# Patient Record
Sex: Male | Born: 1950 | Race: White | Hispanic: No | Marital: Married | Smoking: Never smoker
Health system: Southern US, Community
[De-identification: ages and names within clinical notes are randomized; demographics above are authoritative.]

## PROBLEM LIST (undated history)

## (undated) DIAGNOSIS — J449 Chronic obstructive pulmonary disease, unspecified: Secondary | ICD-10-CM

## (undated) DIAGNOSIS — R112 Nausea with vomiting, unspecified: Secondary | ICD-10-CM

## (undated) DIAGNOSIS — E669 Obesity, unspecified: Secondary | ICD-10-CM

## (undated) DIAGNOSIS — I509 Heart failure, unspecified: Secondary | ICD-10-CM

## (undated) DIAGNOSIS — Z86711 Personal history of pulmonary embolism: Secondary | ICD-10-CM

## (undated) DIAGNOSIS — Z9889 Other specified postprocedural states: Secondary | ICD-10-CM

## (undated) DIAGNOSIS — R55 Syncope and collapse: Secondary | ICD-10-CM

## (undated) DIAGNOSIS — M4310 Spondylolisthesis, site unspecified: Secondary | ICD-10-CM

## (undated) DIAGNOSIS — E785 Hyperlipidemia, unspecified: Secondary | ICD-10-CM

## (undated) DIAGNOSIS — G4730 Sleep apnea, unspecified: Secondary | ICD-10-CM

## (undated) DIAGNOSIS — Z87898 Personal history of other specified conditions: Secondary | ICD-10-CM

## (undated) DIAGNOSIS — F419 Anxiety disorder, unspecified: Secondary | ICD-10-CM

## (undated) DIAGNOSIS — R011 Cardiac murmur, unspecified: Secondary | ICD-10-CM

## (undated) DIAGNOSIS — T8859XA Other complications of anesthesia, initial encounter: Secondary | ICD-10-CM

## (undated) DIAGNOSIS — I499 Cardiac arrhythmia, unspecified: Secondary | ICD-10-CM

## (undated) DIAGNOSIS — I4891 Unspecified atrial fibrillation: Secondary | ICD-10-CM

## (undated) DIAGNOSIS — Z7901 Long term (current) use of anticoagulants: Secondary | ICD-10-CM

## (undated) DIAGNOSIS — M545 Low back pain, unspecified: Secondary | ICD-10-CM

## (undated) DIAGNOSIS — H269 Unspecified cataract: Secondary | ICD-10-CM

## (undated) DIAGNOSIS — T7840XA Allergy, unspecified, initial encounter: Secondary | ICD-10-CM

## (undated) DIAGNOSIS — T4145XA Adverse effect of unspecified anesthetic, initial encounter: Secondary | ICD-10-CM

## (undated) DIAGNOSIS — R51 Headache: Secondary | ICD-10-CM

## (undated) DIAGNOSIS — M25569 Pain in unspecified knee: Secondary | ICD-10-CM

## (undated) DIAGNOSIS — I82409 Acute embolism and thrombosis of unspecified deep veins of unspecified lower extremity: Secondary | ICD-10-CM

## (undated) DIAGNOSIS — M1990 Unspecified osteoarthritis, unspecified site: Secondary | ICD-10-CM

## (undated) DIAGNOSIS — I350 Nonrheumatic aortic (valve) stenosis: Secondary | ICD-10-CM

## (undated) DIAGNOSIS — R609 Edema, unspecified: Secondary | ICD-10-CM

## (undated) DIAGNOSIS — I739 Peripheral vascular disease, unspecified: Secondary | ICD-10-CM

## (undated) DIAGNOSIS — R911 Solitary pulmonary nodule: Secondary | ICD-10-CM

## (undated) DIAGNOSIS — N281 Cyst of kidney, acquired: Secondary | ICD-10-CM

## (undated) DIAGNOSIS — R062 Wheezing: Secondary | ICD-10-CM

## (undated) HISTORY — PX: JOINT REPLACEMENT: SHX530

## (undated) HISTORY — DX: Personal history of pulmonary embolism: Z86.711

## (undated) HISTORY — PX: CARDIAC CATHETERIZATION: SHX172

## (undated) HISTORY — PX: SPINE SURGERY: SHX786

## (undated) HISTORY — PX: VSD REPAIR: SHX276

## (undated) HISTORY — DX: Low back pain, unspecified: M54.50

## (undated) HISTORY — DX: Long term (current) use of anticoagulants: Z79.01

## (undated) HISTORY — PX: LUMBAR DISC SURGERY: SHX700

## (undated) HISTORY — DX: Unspecified osteoarthritis, unspecified site: M19.90

## (undated) HISTORY — PX: CATARACT EXTRACTION W/ INTRAOCULAR LENS IMPLANT: SHX1309

## (undated) HISTORY — PX: EYE SURGERY: SHX253

## (undated) HISTORY — DX: Cardiac murmur, unspecified: R01.1

## (undated) HISTORY — DX: Obesity, unspecified: E66.9

## (undated) HISTORY — DX: Hyperlipidemia, unspecified: E78.5

## (undated) HISTORY — DX: Sleep apnea, unspecified: G47.30

## (undated) HISTORY — DX: Unspecified cataract: H26.9

## (undated) HISTORY — DX: Low back pain: M54.5

## (undated) HISTORY — DX: Allergy, unspecified, initial encounter: T78.40XA

## (undated) HISTORY — DX: Cardiac arrhythmia, unspecified: I49.9

## (undated) HISTORY — PX: CARDIAC VALVE REPLACEMENT: SHX585

## (undated) HISTORY — PX: HAND SURGERY: SHX662

## (undated) HISTORY — DX: Syncope and collapse: R55

## (undated) HISTORY — DX: Edema, unspecified: R60.9

---
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
## 1979-06-24 HISTORY — PX: HEMORROIDECTOMY: SUR656

## 1986-06-23 HISTORY — PX: KNEE ARTHROSCOPY: SUR90

## 1988-06-23 HISTORY — PX: KNEE ARTHROSCOPY: SUR90

## 1991-06-24 HISTORY — PX: CHOLECYSTECTOMY: SHX55

## 1991-06-24 HISTORY — PX: APPENDECTOMY: SHX54

## 1992-06-23 HISTORY — PX: ORCHIECTOMY: SHX2116

## 1999-02-07 ENCOUNTER — Encounter: Payer: Self-pay | Admitting: Orthopedic Surgery

## 1999-02-07 ENCOUNTER — Encounter (INDEPENDENT_AMBULATORY_CARE_PROVIDER_SITE_OTHER): Payer: Self-pay | Admitting: Specialist

## 1999-02-08 ENCOUNTER — Inpatient Hospital Stay (HOSPITAL_COMMUNITY): Admission: EM | Admit: 1999-02-08 | Discharge: 1999-02-09 | Payer: Self-pay | Admitting: Orthopedic Surgery

## 2001-03-30 ENCOUNTER — Encounter: Admission: RE | Admit: 2001-03-30 | Discharge: 2001-04-27 | Payer: Self-pay | Admitting: Neurosurgery

## 2001-06-23 DIAGNOSIS — Z86711 Personal history of pulmonary embolism: Secondary | ICD-10-CM

## 2001-06-23 DIAGNOSIS — I82409 Acute embolism and thrombosis of unspecified deep veins of unspecified lower extremity: Secondary | ICD-10-CM

## 2001-06-23 HISTORY — PX: KNEE ARTHROSCOPY: SUR90

## 2001-06-23 HISTORY — PX: LUMBAR FUSION: SHX111

## 2001-06-23 HISTORY — DX: Acute embolism and thrombosis of unspecified deep veins of unspecified lower extremity: I82.409

## 2001-06-23 HISTORY — DX: Personal history of pulmonary embolism: Z86.711

## 2001-09-15 ENCOUNTER — Ambulatory Visit (HOSPITAL_BASED_OUTPATIENT_CLINIC_OR_DEPARTMENT_OTHER): Admission: RE | Admit: 2001-09-15 | Discharge: 2001-09-15 | Payer: Self-pay | Admitting: Orthopedic Surgery

## 2001-12-02 ENCOUNTER — Encounter: Payer: Self-pay | Admitting: Specialist

## 2001-12-08 ENCOUNTER — Inpatient Hospital Stay (HOSPITAL_COMMUNITY): Admission: RE | Admit: 2001-12-08 | Discharge: 2001-12-12 | Payer: Self-pay | Admitting: Specialist

## 2001-12-08 ENCOUNTER — Encounter: Payer: Self-pay | Admitting: Specialist

## 2001-12-27 ENCOUNTER — Inpatient Hospital Stay (HOSPITAL_COMMUNITY): Admission: EM | Admit: 2001-12-27 | Discharge: 2002-01-04 | Payer: Self-pay | Admitting: Emergency Medicine

## 2001-12-27 ENCOUNTER — Encounter: Payer: Self-pay | Admitting: Specialist

## 2001-12-27 ENCOUNTER — Ambulatory Visit (HOSPITAL_COMMUNITY): Admission: RE | Admit: 2001-12-27 | Discharge: 2001-12-27 | Payer: Self-pay | Admitting: Specialist

## 2001-12-28 ENCOUNTER — Encounter (INDEPENDENT_AMBULATORY_CARE_PROVIDER_SITE_OTHER): Payer: Self-pay | Admitting: Cardiology

## 2001-12-29 ENCOUNTER — Encounter: Payer: Self-pay | Admitting: Internal Medicine

## 2002-06-23 DIAGNOSIS — Z87898 Personal history of other specified conditions: Secondary | ICD-10-CM

## 2002-06-23 HISTORY — DX: Personal history of other specified conditions: Z87.898

## 2003-02-02 ENCOUNTER — Encounter: Payer: Self-pay | Admitting: Orthopedic Surgery

## 2003-02-03 ENCOUNTER — Ambulatory Visit (HOSPITAL_COMMUNITY): Admission: RE | Admit: 2003-02-03 | Discharge: 2003-02-03 | Payer: Self-pay | Admitting: Orthopedic Surgery

## 2003-02-09 ENCOUNTER — Emergency Department (HOSPITAL_COMMUNITY): Admission: EM | Admit: 2003-02-09 | Discharge: 2003-02-09 | Payer: Self-pay

## 2004-05-03 ENCOUNTER — Ambulatory Visit: Payer: Self-pay | Admitting: Cardiology

## 2004-05-31 ENCOUNTER — Ambulatory Visit: Payer: Self-pay | Admitting: Internal Medicine

## 2004-06-21 ENCOUNTER — Ambulatory Visit: Payer: Self-pay | Admitting: Cardiology

## 2004-07-18 ENCOUNTER — Ambulatory Visit: Payer: Self-pay | Admitting: Internal Medicine

## 2004-08-01 ENCOUNTER — Ambulatory Visit: Payer: Self-pay | Admitting: Cardiology

## 2004-08-15 ENCOUNTER — Ambulatory Visit: Payer: Self-pay | Admitting: Internal Medicine

## 2004-09-05 ENCOUNTER — Ambulatory Visit: Payer: Self-pay | Admitting: Cardiology

## 2004-09-19 ENCOUNTER — Ambulatory Visit: Payer: Self-pay | Admitting: Internal Medicine

## 2004-10-31 ENCOUNTER — Ambulatory Visit: Payer: Self-pay | Admitting: Cardiology

## 2004-11-28 ENCOUNTER — Ambulatory Visit: Payer: Self-pay | Admitting: Cardiology

## 2004-12-12 ENCOUNTER — Ambulatory Visit: Payer: Self-pay | Admitting: Cardiology

## 2005-01-02 ENCOUNTER — Ambulatory Visit: Payer: Self-pay | Admitting: Internal Medicine

## 2005-01-30 ENCOUNTER — Ambulatory Visit: Payer: Self-pay | Admitting: Cardiology

## 2005-02-27 ENCOUNTER — Ambulatory Visit: Payer: Self-pay | Admitting: Cardiology

## 2005-03-07 ENCOUNTER — Ambulatory Visit: Payer: Self-pay | Admitting: Internal Medicine

## 2005-03-27 ENCOUNTER — Ambulatory Visit: Payer: Self-pay | Admitting: Cardiology

## 2005-04-09 ENCOUNTER — Ambulatory Visit: Payer: Self-pay | Admitting: Cardiovascular Disease

## 2005-04-30 ENCOUNTER — Ambulatory Visit: Payer: Self-pay | Admitting: Cardiology

## 2005-05-13 ENCOUNTER — Ambulatory Visit: Payer: Self-pay | Admitting: Cardiology

## 2005-05-13 ENCOUNTER — Ambulatory Visit: Payer: Self-pay | Admitting: Internal Medicine

## 2005-06-03 ENCOUNTER — Ambulatory Visit: Payer: Self-pay | Admitting: Cardiology

## 2005-06-18 ENCOUNTER — Ambulatory Visit: Payer: Self-pay | Admitting: Cardiology

## 2005-06-27 ENCOUNTER — Ambulatory Visit: Payer: Self-pay | Admitting: Cardiology

## 2005-07-11 ENCOUNTER — Ambulatory Visit: Payer: Self-pay | Admitting: Cardiology

## 2005-08-01 ENCOUNTER — Ambulatory Visit: Payer: Self-pay | Admitting: Cardiovascular Disease

## 2005-08-29 ENCOUNTER — Ambulatory Visit: Payer: Self-pay | Admitting: Cardiology

## 2005-09-29 ENCOUNTER — Ambulatory Visit: Payer: Self-pay | Admitting: Internal Medicine

## 2005-10-27 ENCOUNTER — Ambulatory Visit: Payer: Self-pay | Admitting: Cardiology

## 2005-10-28 ENCOUNTER — Ambulatory Visit: Payer: Self-pay | Admitting: Internal Medicine

## 2005-11-10 ENCOUNTER — Ambulatory Visit: Payer: Self-pay | Admitting: Internal Medicine

## 2005-11-25 ENCOUNTER — Ambulatory Visit: Payer: Self-pay | Admitting: Internal Medicine

## 2005-12-23 ENCOUNTER — Ambulatory Visit: Payer: Self-pay | Admitting: Cardiology

## 2006-01-20 ENCOUNTER — Ambulatory Visit: Payer: Self-pay | Admitting: Cardiology

## 2006-01-30 ENCOUNTER — Ambulatory Visit: Payer: Self-pay | Admitting: Internal Medicine

## 2006-02-13 ENCOUNTER — Ambulatory Visit: Payer: Self-pay | Admitting: Cardiology

## 2006-02-26 ENCOUNTER — Ambulatory Visit: Payer: Self-pay | Admitting: Cardiology

## 2006-03-05 ENCOUNTER — Ambulatory Visit: Payer: Self-pay | Admitting: Cardiology

## 2006-03-23 ENCOUNTER — Ambulatory Visit: Payer: Self-pay | Admitting: Cardiology

## 2006-04-01 ENCOUNTER — Ambulatory Visit: Payer: Self-pay | Admitting: Cardiology

## 2006-04-08 ENCOUNTER — Ambulatory Visit: Payer: Self-pay | Admitting: Internal Medicine

## 2006-04-30 ENCOUNTER — Ambulatory Visit: Payer: Self-pay | Admitting: Internal Medicine

## 2006-04-30 LAB — CONVERTED CEMR LAB
ALT: 42 units/L — ABNORMAL HIGH (ref 0–40)
AST: 39 units/L — ABNORMAL HIGH (ref 0–37)
Albumin: 3.8 g/dL (ref 3.5–5.2)
Alkaline Phosphatase: 54 units/L (ref 39–117)
BUN: 11 mg/dL (ref 6–23)
Basophils Absolute: 0.1 10*3/uL (ref 0.0–0.1)
Basophils Relative: 0.8 % (ref 0.0–1.0)
CO2: 31 meq/L (ref 19–32)
Calcium: 9.4 mg/dL (ref 8.4–10.5)
Chloride: 102 meq/L (ref 96–112)
Chol/HDL Ratio, serum: 8.3
Cholesterol: 206 mg/dL (ref 0–200)
Creatinine, Ser: 1 mg/dL (ref 0.4–1.5)
Eosinophil percent: 8 % — ABNORMAL HIGH (ref 0.0–5.0)
GFR calc non Af Amer: 83 mL/min
Glomerular Filtration Rate, Af Am: 100 mL/min/{1.73_m2}
Glucose, Bld: 108 mg/dL — ABNORMAL HIGH (ref 70–99)
HCT: 47 % (ref 39.0–52.0)
HDL: 24.7 mg/dL — ABNORMAL LOW (ref >39.0)
Hemoglobin: 15.5 g/dL (ref 13.0–17.0)
Hgb A1c MFr Bld: 5.1 % (ref 4.6–6.0)
INR: 2.3 — ABNORMAL HIGH (ref 0.9–2.0)
LDL DIRECT: 153.5 mg/dL
Lymphocytes Relative: 30 % (ref 12.0–46.0)
MCHC: 32.9 g/dL (ref 30.0–36.0)
MCV: 93.4 fL (ref 78.0–100.0)
Monocytes Absolute: 0.8 10*3/uL — ABNORMAL HIGH (ref 0.2–0.7)
Monocytes Relative: 10.6 % (ref 3.0–11.0)
Neutro Abs: 3.8 10*3/uL (ref 1.4–7.7)
Neutrophils Relative %: 50.6 % (ref 43.0–77.0)
Platelets: 316 10*3/uL (ref 150–400)
Potassium: 4.9 meq/L (ref 3.5–5.1)
Prothrombin Time: 19.5 s — ABNORMAL HIGH (ref 10.0–14.0)
RBC: 5.03 M/uL (ref 4.22–5.81)
RDW: 12.1 % (ref 11.5–14.6)
Sodium: 138 meq/L (ref 135–145)
TSH: 1.99 microintl units/mL (ref 0.35–5.50)
Total Bilirubin: 1 mg/dL (ref 0.3–1.2)
Total Protein: 7.7 g/dL (ref 6.0–8.3)
Triglyceride fasting, serum: 118 mg/dL (ref 0–149)
VLDL: 24 mg/dL (ref 0–40)
WBC: 7.5 10*3/uL (ref 4.5–10.5)

## 2006-05-19 ENCOUNTER — Ambulatory Visit: Payer: Self-pay | Admitting: Cardiology

## 2006-06-02 ENCOUNTER — Ambulatory Visit: Payer: Self-pay | Admitting: Cardiology

## 2006-06-22 ENCOUNTER — Ambulatory Visit: Payer: Self-pay | Admitting: Cardiology

## 2006-07-01 ENCOUNTER — Ambulatory Visit: Payer: Self-pay | Admitting: Cardiology

## 2006-07-03 ENCOUNTER — Ambulatory Visit: Payer: Self-pay | Admitting: Internal Medicine

## 2006-07-16 ENCOUNTER — Ambulatory Visit: Payer: Self-pay | Admitting: Cardiology

## 2006-08-06 ENCOUNTER — Ambulatory Visit: Payer: Self-pay | Admitting: Cardiology

## 2006-08-27 ENCOUNTER — Ambulatory Visit: Payer: Self-pay | Admitting: Cardiology

## 2006-09-03 ENCOUNTER — Ambulatory Visit: Payer: Self-pay | Admitting: Internal Medicine

## 2006-09-24 ENCOUNTER — Ambulatory Visit: Payer: Self-pay | Admitting: Internal Medicine

## 2006-09-25 ENCOUNTER — Ambulatory Visit: Payer: Self-pay | Admitting: Internal Medicine

## 2006-09-29 ENCOUNTER — Ambulatory Visit: Payer: Self-pay | Admitting: Internal Medicine

## 2006-10-22 ENCOUNTER — Ambulatory Visit: Payer: Self-pay | Admitting: Internal Medicine

## 2006-11-19 ENCOUNTER — Ambulatory Visit: Payer: Self-pay | Admitting: Cardiology

## 2006-12-17 ENCOUNTER — Ambulatory Visit: Payer: Self-pay | Admitting: Internal Medicine

## 2007-01-14 ENCOUNTER — Ambulatory Visit: Payer: Self-pay | Admitting: Internal Medicine

## 2007-02-12 ENCOUNTER — Ambulatory Visit: Payer: Self-pay | Admitting: Cardiovascular Disease

## 2007-03-12 ENCOUNTER — Ambulatory Visit: Payer: Self-pay | Admitting: Cardiovascular Disease

## 2007-04-09 ENCOUNTER — Ambulatory Visit: Payer: Self-pay | Admitting: Cardiovascular Disease

## 2007-04-19 ENCOUNTER — Ambulatory Visit: Payer: Self-pay | Admitting: Internal Medicine

## 2007-04-19 LAB — CONVERTED CEMR LAB
ALT: 36 units/L (ref 0–53)
AST: 29 units/L (ref 0–37)
Albumin: 3.8 g/dL (ref 3.5–5.2)
Alkaline Phosphatase: 54 units/L (ref 39–117)
BUN: 14 mg/dL (ref 6–23)
Basophils Absolute: 0 10*3/uL (ref 0.0–0.1)
Basophils Relative: 0.6 % (ref 0.0–1.0)
Bilirubin, Direct: 0.2 mg/dL (ref 0.0–0.3)
CO2: 30 meq/L (ref 19–32)
Calcium: 9 mg/dL (ref 8.4–10.5)
Chloride: 103 meq/L (ref 96–112)
Creatinine, Ser: 1 mg/dL (ref 0.4–1.5)
Eosinophils Absolute: 0.6 10*3/uL (ref 0.0–0.6)
Eosinophils Relative: 8.9 % — ABNORMAL HIGH (ref 0.0–5.0)
GFR calc Af Amer: 100 mL/min
GFR calc non Af Amer: 82 mL/min
Glucose, Bld: 99 mg/dL (ref 70–99)
HCT: 44.6 % (ref 39.0–52.0)
Hemoglobin: 15.6 g/dL (ref 13.0–17.0)
Hgb A1c MFr Bld: 5.3 % (ref 4.6–6.0)
Lymphocytes Relative: 33.4 % (ref 12.0–46.0)
MCHC: 34.9 g/dL (ref 30.0–36.0)
MCV: 93.1 fL (ref 78.0–100.0)
Monocytes Absolute: 0.7 10*3/uL (ref 0.2–0.7)
Monocytes Relative: 10.5 % (ref 3.0–11.0)
Neutro Abs: 3.2 10*3/uL (ref 1.4–7.7)
Neutrophils Relative %: 46.6 % (ref 43.0–77.0)
Platelets: 299 10*3/uL (ref 150–400)
Potassium: 4.6 meq/L (ref 3.5–5.1)
RBC: 4.79 M/uL (ref 4.22–5.81)
RDW: 12 % (ref 11.5–14.6)
Sodium: 139 meq/L (ref 135–145)
TSH: 3.32 microintl units/mL (ref 0.35–5.50)
Total Bilirubin: 1.2 mg/dL (ref 0.3–1.2)
Total Protein: 7.5 g/dL (ref 6.0–8.3)
WBC: 6.7 10*3/uL (ref 4.5–10.5)

## 2007-04-23 ENCOUNTER — Encounter: Payer: Self-pay | Admitting: Internal Medicine

## 2007-04-23 ENCOUNTER — Ambulatory Visit: Payer: Self-pay | Admitting: Internal Medicine

## 2007-04-23 DIAGNOSIS — Z86718 Personal history of other venous thrombosis and embolism: Secondary | ICD-10-CM | POA: Insufficient documentation

## 2007-04-23 DIAGNOSIS — D485 Neoplasm of uncertain behavior of skin: Secondary | ICD-10-CM | POA: Insufficient documentation

## 2007-04-23 DIAGNOSIS — R609 Edema, unspecified: Secondary | ICD-10-CM | POA: Insufficient documentation

## 2007-04-30 ENCOUNTER — Ambulatory Visit: Payer: Self-pay | Admitting: Cardiology

## 2007-05-04 ENCOUNTER — Encounter: Payer: Self-pay | Admitting: Internal Medicine

## 2007-05-31 ENCOUNTER — Ambulatory Visit: Payer: Self-pay | Admitting: Internal Medicine

## 2007-06-09 ENCOUNTER — Telehealth: Payer: Self-pay | Admitting: Internal Medicine

## 2007-06-28 ENCOUNTER — Ambulatory Visit: Payer: Self-pay | Admitting: Internal Medicine

## 2007-07-12 ENCOUNTER — Ambulatory Visit: Payer: Self-pay | Admitting: Cardiovascular Disease

## 2007-08-02 ENCOUNTER — Ambulatory Visit: Payer: Self-pay | Admitting: Cardiology

## 2007-08-16 ENCOUNTER — Ambulatory Visit: Payer: Self-pay | Admitting: Internal Medicine

## 2007-08-16 DIAGNOSIS — J189 Pneumonia, unspecified organism: Secondary | ICD-10-CM | POA: Insufficient documentation

## 2007-08-24 ENCOUNTER — Ambulatory Visit: Payer: Self-pay | Admitting: Internal Medicine

## 2007-08-25 ENCOUNTER — Telehealth (INDEPENDENT_AMBULATORY_CARE_PROVIDER_SITE_OTHER): Payer: Self-pay | Admitting: *Deleted

## 2007-08-26 ENCOUNTER — Ambulatory Visit: Payer: Self-pay | Admitting: Internal Medicine

## 2007-08-26 DIAGNOSIS — J45909 Unspecified asthma, uncomplicated: Secondary | ICD-10-CM | POA: Insufficient documentation

## 2007-09-14 ENCOUNTER — Ambulatory Visit: Payer: Self-pay | Admitting: Cardiology

## 2007-10-12 ENCOUNTER — Ambulatory Visit: Payer: Self-pay | Admitting: Cardiovascular Disease

## 2007-10-26 ENCOUNTER — Ambulatory Visit: Payer: Self-pay | Admitting: Cardiovascular Disease

## 2007-11-23 ENCOUNTER — Ambulatory Visit: Payer: Self-pay | Admitting: Cardiovascular Disease

## 2007-12-21 ENCOUNTER — Ambulatory Visit: Payer: Self-pay | Admitting: Internal Medicine

## 2008-01-04 ENCOUNTER — Ambulatory Visit: Payer: Self-pay | Admitting: Cardiology

## 2008-01-20 ENCOUNTER — Ambulatory Visit: Payer: Self-pay | Admitting: Internal Medicine

## 2008-02-02 ENCOUNTER — Ambulatory Visit: Payer: Self-pay | Admitting: Cardiovascular Disease

## 2008-02-23 ENCOUNTER — Ambulatory Visit: Payer: Self-pay | Admitting: Cardiology

## 2008-03-08 ENCOUNTER — Ambulatory Visit: Payer: Self-pay | Admitting: Cardiovascular Disease

## 2008-03-10 ENCOUNTER — Telehealth (INDEPENDENT_AMBULATORY_CARE_PROVIDER_SITE_OTHER): Payer: Self-pay | Admitting: *Deleted

## 2008-03-10 ENCOUNTER — Ambulatory Visit: Payer: Self-pay | Admitting: Internal Medicine

## 2008-03-17 ENCOUNTER — Ambulatory Visit: Payer: Self-pay | Admitting: Internal Medicine

## 2008-03-29 ENCOUNTER — Ambulatory Visit: Payer: Self-pay | Admitting: Cardiology

## 2008-03-31 ENCOUNTER — Telehealth: Payer: Self-pay | Admitting: Internal Medicine

## 2008-04-01 ENCOUNTER — Telehealth: Payer: Self-pay | Admitting: Family Medicine

## 2008-04-03 ENCOUNTER — Ambulatory Visit: Payer: Self-pay | Admitting: Internal Medicine

## 2008-04-03 ENCOUNTER — Observation Stay (HOSPITAL_COMMUNITY): Admission: AD | Admit: 2008-04-03 | Discharge: 2008-04-04 | Payer: Self-pay | Admitting: Internal Medicine

## 2008-04-03 DIAGNOSIS — E860 Dehydration: Secondary | ICD-10-CM | POA: Insufficient documentation

## 2008-04-26 ENCOUNTER — Ambulatory Visit: Payer: Self-pay | Admitting: Cardiology

## 2008-05-24 ENCOUNTER — Ambulatory Visit: Payer: Self-pay | Admitting: Cardiology

## 2008-06-13 ENCOUNTER — Ambulatory Visit: Payer: Self-pay | Admitting: Internal Medicine

## 2008-06-14 ENCOUNTER — Telehealth (INDEPENDENT_AMBULATORY_CARE_PROVIDER_SITE_OTHER): Payer: Self-pay | Admitting: *Deleted

## 2008-06-14 ENCOUNTER — Ambulatory Visit: Payer: Self-pay | Admitting: Cardiology

## 2008-07-05 ENCOUNTER — Ambulatory Visit: Payer: Self-pay | Admitting: Cardiology

## 2008-07-20 ENCOUNTER — Telehealth: Payer: Self-pay | Admitting: Internal Medicine

## 2008-07-28 ENCOUNTER — Encounter: Payer: Self-pay | Admitting: Internal Medicine

## 2008-08-02 ENCOUNTER — Ambulatory Visit: Payer: Self-pay | Admitting: Cardiology

## 2008-08-30 ENCOUNTER — Ambulatory Visit: Payer: Self-pay | Admitting: Cardiology

## 2008-09-27 ENCOUNTER — Ambulatory Visit: Payer: Self-pay | Admitting: Internal Medicine

## 2008-10-11 ENCOUNTER — Ambulatory Visit: Payer: Self-pay | Admitting: Cardiology

## 2008-10-27 ENCOUNTER — Ambulatory Visit: Payer: Self-pay | Admitting: Internal Medicine

## 2008-10-27 DIAGNOSIS — L02519 Cutaneous abscess of unspecified hand: Secondary | ICD-10-CM | POA: Insufficient documentation

## 2008-10-27 DIAGNOSIS — H6690 Otitis media, unspecified, unspecified ear: Secondary | ICD-10-CM | POA: Insufficient documentation

## 2008-10-27 DIAGNOSIS — L03119 Cellulitis of unspecified part of limb: Secondary | ICD-10-CM

## 2008-11-01 ENCOUNTER — Ambulatory Visit: Payer: Self-pay | Admitting: Cardiology

## 2008-11-10 ENCOUNTER — Ambulatory Visit: Payer: Self-pay | Admitting: Internal Medicine

## 2008-11-22 ENCOUNTER — Encounter: Payer: Self-pay | Admitting: *Deleted

## 2008-12-01 ENCOUNTER — Ambulatory Visit: Payer: Self-pay | Admitting: Cardiology

## 2008-12-01 LAB — CONVERTED CEMR LAB
POC INR: 2.2
Protime: 18.2

## 2008-12-19 ENCOUNTER — Encounter: Payer: Self-pay | Admitting: Internal Medicine

## 2008-12-27 ENCOUNTER — Ambulatory Visit: Payer: Self-pay | Admitting: Internal Medicine

## 2008-12-27 ENCOUNTER — Encounter: Payer: Self-pay | Admitting: *Deleted

## 2008-12-27 LAB — CONVERTED CEMR LAB
POC INR: 3.6
Prothrombin Time: 22.9 s

## 2009-01-17 ENCOUNTER — Ambulatory Visit: Payer: Self-pay | Admitting: Cardiovascular Disease

## 2009-01-17 LAB — CONVERTED CEMR LAB
POC INR: 3
Prothrombin Time: 21 s

## 2009-02-14 ENCOUNTER — Ambulatory Visit: Payer: Self-pay | Admitting: Cardiovascular Disease

## 2009-02-14 LAB — CONVERTED CEMR LAB: POC INR: 3.2

## 2009-03-14 ENCOUNTER — Ambulatory Visit: Payer: Self-pay | Admitting: Cardiovascular Disease

## 2009-03-14 LAB — CONVERTED CEMR LAB: POC INR: 3.6

## 2009-04-04 ENCOUNTER — Ambulatory Visit: Payer: Self-pay | Admitting: Cardiology

## 2009-04-04 LAB — CONVERTED CEMR LAB: POC INR: 3.8

## 2009-04-18 ENCOUNTER — Ambulatory Visit: Payer: Self-pay | Admitting: Cardiology

## 2009-04-18 LAB — CONVERTED CEMR LAB: POC INR: 3.6

## 2009-04-24 ENCOUNTER — Encounter: Payer: Self-pay | Admitting: Internal Medicine

## 2009-05-09 ENCOUNTER — Ambulatory Visit: Payer: Self-pay | Admitting: Internal Medicine

## 2009-05-09 ENCOUNTER — Ambulatory Visit: Payer: Self-pay | Admitting: Cardiology

## 2009-05-09 LAB — CONVERTED CEMR LAB
ALT: 33 units/L (ref 0–53)
AST: 28 units/L (ref 0–37)
Albumin: 3.7 g/dL (ref 3.5–5.2)
Alkaline Phosphatase: 66 units/L (ref 39–117)
BUN: 10 mg/dL (ref 6–23)
Basophils Absolute: 0 10*3/uL (ref 0.0–0.1)
Basophils Relative: 0.7 % (ref 0.0–3.0)
Bilirubin Urine: NEGATIVE
Bilirubin, Direct: 0.1 mg/dL (ref 0.0–0.3)
CO2: 28 meq/L (ref 19–32)
Calcium: 8.9 mg/dL (ref 8.4–10.5)
Chloride: 106 meq/L (ref 96–112)
Cholesterol: 228 mg/dL — ABNORMAL HIGH (ref 0–200)
Creatinine, Ser: 0.9 mg/dL (ref 0.4–1.5)
Direct LDL: 168.2 mg/dL
Eosinophils Absolute: 0.6 10*3/uL (ref 0.0–0.7)
Eosinophils Relative: 8.4 % — ABNORMAL HIGH (ref 0.0–5.0)
GFR calc non Af Amer: 92.24 mL/min (ref >60)
Glucose, Bld: 82 mg/dL (ref 70–99)
HCT: 44.4 % (ref 39.0–52.0)
HDL: 27.4 mg/dL — ABNORMAL LOW (ref >39.00)
Hemoglobin, Urine: NEGATIVE
Hemoglobin: 15.9 g/dL (ref 13.0–17.0)
Ketones, ur: NEGATIVE mg/dL
Leukocytes, UA: NEGATIVE
Lymphocytes Relative: 31 % (ref 12.0–46.0)
Lymphs Abs: 2.2 10*3/uL (ref 0.7–4.0)
MCHC: 35.7 g/dL (ref 30.0–36.0)
MCV: 94.2 fL (ref 78.0–100.0)
Monocytes Absolute: 0.6 10*3/uL (ref 0.1–1.0)
Monocytes Relative: 7.9 % (ref 3.0–12.0)
Neutro Abs: 3.7 10*3/uL (ref 1.4–7.7)
Neutrophils Relative %: 52 % (ref 43.0–77.0)
Nitrite: NEGATIVE
POC INR: 4.2
PSA: 0.16 ng/mL (ref 0.10–4.00)
Platelets: 272 10*3/uL (ref 150.0–400.0)
Potassium: 4.3 meq/L (ref 3.5–5.1)
RBC: 4.72 M/uL (ref 4.22–5.81)
RDW: 12 % (ref 11.5–14.6)
Sodium: 143 meq/L (ref 135–145)
Specific Gravity, Urine: 1.02 (ref 1.000–1.030)
TSH: 2.58 microintl units/mL (ref 0.35–5.50)
Total Bilirubin: 0.9 mg/dL (ref 0.3–1.2)
Total CHOL/HDL Ratio: 8
Total Protein, Urine: NEGATIVE mg/dL
Total Protein: 7.6 g/dL (ref 6.0–8.3)
Triglycerides: 211 mg/dL — ABNORMAL HIGH (ref 0.0–149.0)
Urine Glucose: NEGATIVE mg/dL
Urobilinogen, UA: 0.2 (ref 0.0–1.0)
VLDL: 42.2 mg/dL — ABNORMAL HIGH (ref 0.0–40.0)
WBC: 7.1 10*3/uL (ref 4.5–10.5)
pH: 5 (ref 5.0–8.0)

## 2009-05-15 ENCOUNTER — Ambulatory Visit: Payer: Self-pay | Admitting: Internal Medicine

## 2009-05-15 DIAGNOSIS — M1990 Unspecified osteoarthritis, unspecified site: Secondary | ICD-10-CM | POA: Insufficient documentation

## 2009-05-15 DIAGNOSIS — E785 Hyperlipidemia, unspecified: Secondary | ICD-10-CM | POA: Insufficient documentation

## 2009-05-23 ENCOUNTER — Ambulatory Visit: Payer: Self-pay | Admitting: Cardiology

## 2009-05-23 LAB — CONVERTED CEMR LAB: POC INR: 2.6

## 2009-06-14 ENCOUNTER — Ambulatory Visit: Payer: Self-pay | Admitting: Internal Medicine

## 2009-06-14 LAB — CONVERTED CEMR LAB: POC INR: 2.9

## 2009-07-12 ENCOUNTER — Ambulatory Visit: Payer: Self-pay | Admitting: Cardiovascular Disease

## 2009-07-12 LAB — CONVERTED CEMR LAB: POC INR: 2.8

## 2009-08-09 ENCOUNTER — Ambulatory Visit: Payer: Self-pay | Admitting: Cardiovascular Disease

## 2009-08-09 LAB — CONVERTED CEMR LAB: POC INR: 2.6

## 2009-09-06 ENCOUNTER — Ambulatory Visit: Payer: Self-pay | Admitting: Cardiology

## 2009-09-06 LAB — CONVERTED CEMR LAB: POC INR: 3.2

## 2009-10-04 ENCOUNTER — Ambulatory Visit: Payer: Self-pay | Admitting: Cardiology

## 2009-10-04 LAB — CONVERTED CEMR LAB: POC INR: 2.3

## 2009-10-08 ENCOUNTER — Ambulatory Visit: Payer: Self-pay | Admitting: Internal Medicine

## 2009-10-08 DIAGNOSIS — R197 Diarrhea, unspecified: Secondary | ICD-10-CM | POA: Insufficient documentation

## 2009-10-08 DIAGNOSIS — J209 Acute bronchitis, unspecified: Secondary | ICD-10-CM | POA: Insufficient documentation

## 2009-10-11 ENCOUNTER — Ambulatory Visit: Payer: Self-pay | Admitting: Internal Medicine

## 2009-10-11 LAB — CONVERTED CEMR LAB: POC INR: 3

## 2009-10-25 ENCOUNTER — Telehealth: Payer: Self-pay | Admitting: Internal Medicine

## 2009-11-08 ENCOUNTER — Ambulatory Visit: Payer: Self-pay | Admitting: Internal Medicine

## 2009-11-08 LAB — CONVERTED CEMR LAB: POC INR: 3.3

## 2009-11-14 ENCOUNTER — Ambulatory Visit: Payer: Self-pay | Admitting: Internal Medicine

## 2009-11-14 ENCOUNTER — Telehealth: Payer: Self-pay | Admitting: Internal Medicine

## 2009-11-14 DIAGNOSIS — R638 Other symptoms and signs concerning food and fluid intake: Secondary | ICD-10-CM | POA: Insufficient documentation

## 2009-11-14 DIAGNOSIS — E669 Obesity, unspecified: Secondary | ICD-10-CM

## 2009-11-14 DIAGNOSIS — L219 Seborrheic dermatitis, unspecified: Secondary | ICD-10-CM | POA: Insufficient documentation

## 2009-11-14 DIAGNOSIS — H60399 Other infective otitis externa, unspecified ear: Secondary | ICD-10-CM | POA: Insufficient documentation

## 2009-12-06 ENCOUNTER — Ambulatory Visit: Payer: Self-pay | Admitting: Cardiovascular Disease

## 2009-12-06 LAB — CONVERTED CEMR LAB: POC INR: 3.7

## 2009-12-20 ENCOUNTER — Ambulatory Visit: Payer: Self-pay | Admitting: Cardiology

## 2009-12-20 LAB — CONVERTED CEMR LAB: POC INR: 2.7

## 2010-01-17 ENCOUNTER — Ambulatory Visit: Payer: Self-pay | Admitting: Cardiology

## 2010-01-17 LAB — CONVERTED CEMR LAB: POC INR: 2.6

## 2010-02-06 ENCOUNTER — Ambulatory Visit: Payer: Self-pay | Admitting: Internal Medicine

## 2010-02-06 ENCOUNTER — Encounter: Payer: Self-pay | Admitting: Internal Medicine

## 2010-02-06 LAB — CONVERTED CEMR LAB
BUN: 12 mg/dL (ref 6–23)
Basophils Absolute: 0.1 10*3/uL (ref 0.0–0.1)
Basophils Relative: 1 % (ref 0.0–3.0)
CO2: 29 meq/L (ref 19–32)
Calcium: 9.2 mg/dL (ref 8.4–10.5)
Chloride: 100 meq/L (ref 96–112)
Creatinine, Ser: 1.1 mg/dL (ref 0.4–1.5)
Eosinophils Absolute: 0.7 10*3/uL (ref 0.0–0.7)
Eosinophils Relative: 7.3 % — ABNORMAL HIGH (ref 0.0–5.0)
GFR calc non Af Amer: 77 mL/min (ref >60)
Glucose, Bld: 85 mg/dL (ref 70–99)
HCT: 44.6 % (ref 39.0–52.0)
Hemoglobin: 15.6 g/dL (ref 13.0–17.0)
Lymphocytes Relative: 31.1 % (ref 12.0–46.0)
Lymphs Abs: 2.9 10*3/uL (ref 0.7–4.0)
MCHC: 34.9 g/dL (ref 30.0–36.0)
MCV: 94.2 fL (ref 78.0–100.0)
Monocytes Absolute: 1 10*3/uL (ref 0.1–1.0)
Monocytes Relative: 10.7 % (ref 3.0–12.0)
Neutro Abs: 4.6 10*3/uL (ref 1.4–7.7)
Neutrophils Relative %: 49.9 % (ref 43.0–77.0)
Platelets: 312 10*3/uL (ref 150.0–400.0)
Potassium: 3.9 meq/L (ref 3.5–5.1)
RBC: 4.74 M/uL (ref 4.22–5.81)
RDW: 12.7 % (ref 11.5–14.6)
Sodium: 139 meq/L (ref 135–145)
Troponin I: 0.01 ng/mL (ref <0.06)
WBC: 9.3 10*3/uL (ref 4.5–10.5)

## 2010-02-07 ENCOUNTER — Telehealth: Payer: Self-pay | Admitting: Internal Medicine

## 2010-02-07 LAB — CONVERTED CEMR LAB
INR: 2.7 — ABNORMAL HIGH (ref 0.8–1.0)
Prothrombin Time: 28.6 s — ABNORMAL HIGH (ref 9.7–11.8)

## 2010-02-08 ENCOUNTER — Ambulatory Visit: Payer: Self-pay | Admitting: Cardiology

## 2010-02-13 ENCOUNTER — Ambulatory Visit: Payer: Self-pay | Admitting: Internal Medicine

## 2010-02-18 ENCOUNTER — Encounter: Payer: Self-pay | Admitting: Internal Medicine

## 2010-03-06 ENCOUNTER — Ambulatory Visit: Payer: Self-pay | Admitting: Cardiology

## 2010-03-06 LAB — CONVERTED CEMR LAB: POC INR: 2.7

## 2010-04-03 ENCOUNTER — Ambulatory Visit: Payer: Self-pay | Admitting: Cardiovascular Disease

## 2010-04-03 LAB — CONVERTED CEMR LAB: POC INR: 2.2

## 2010-04-22 ENCOUNTER — Ambulatory Visit: Payer: Self-pay | Admitting: Internal Medicine

## 2010-04-29 ENCOUNTER — Ambulatory Visit: Payer: Self-pay | Admitting: Cardiovascular Disease

## 2010-04-29 LAB — CONVERTED CEMR LAB: POC INR: 2.8

## 2010-05-15 ENCOUNTER — Ambulatory Visit: Payer: Self-pay | Admitting: Pulmonary Disease

## 2010-05-15 DIAGNOSIS — I82409 Acute embolism and thrombosis of unspecified deep veins of unspecified lower extremity: Secondary | ICD-10-CM | POA: Insufficient documentation

## 2010-05-15 DIAGNOSIS — R599 Enlarged lymph nodes, unspecified: Secondary | ICD-10-CM | POA: Insufficient documentation

## 2010-05-17 ENCOUNTER — Encounter: Payer: Self-pay | Admitting: Pulmonary Disease

## 2010-05-17 ENCOUNTER — Ambulatory Visit: Payer: Self-pay

## 2010-05-20 ENCOUNTER — Encounter: Payer: Self-pay | Admitting: Cardiology

## 2010-05-21 ENCOUNTER — Ambulatory Visit (HOSPITAL_COMMUNITY)
Admission: RE | Admit: 2010-05-21 | Discharge: 2010-05-21 | Payer: Self-pay | Source: Home / Self Care | Admitting: Pulmonary Disease

## 2010-05-22 ENCOUNTER — Encounter: Payer: Self-pay | Admitting: Pulmonary Disease

## 2010-05-23 ENCOUNTER — Telehealth: Payer: Self-pay | Admitting: Internal Medicine

## 2010-05-27 ENCOUNTER — Ambulatory Visit: Payer: Self-pay | Admitting: Cardiovascular Disease

## 2010-05-27 ENCOUNTER — Telehealth: Payer: Self-pay | Admitting: Internal Medicine

## 2010-05-27 LAB — CONVERTED CEMR LAB: POC INR: 2.7

## 2010-05-30 ENCOUNTER — Ambulatory Visit: Payer: Self-pay | Admitting: Pulmonary Disease

## 2010-05-31 ENCOUNTER — Telehealth (INDEPENDENT_AMBULATORY_CARE_PROVIDER_SITE_OTHER): Payer: Self-pay | Admitting: *Deleted

## 2010-06-03 ENCOUNTER — Ambulatory Visit: Payer: Self-pay | Admitting: Cardiology

## 2010-06-03 LAB — CONVERTED CEMR LAB: POC INR: 1.5

## 2010-06-10 ENCOUNTER — Ambulatory Visit: Payer: Self-pay | Admitting: Cardiology

## 2010-06-10 LAB — CONVERTED CEMR LAB: POC INR: 1.8

## 2010-06-18 ENCOUNTER — Ambulatory Visit
Admission: RE | Admit: 2010-06-18 | Discharge: 2010-06-18 | Payer: Self-pay | Source: Home / Self Care | Attending: Internal Medicine | Admitting: Internal Medicine

## 2010-06-18 ENCOUNTER — Ambulatory Visit: Payer: Self-pay | Admitting: Cardiology

## 2010-06-18 DIAGNOSIS — R209 Unspecified disturbances of skin sensation: Secondary | ICD-10-CM | POA: Insufficient documentation

## 2010-06-18 DIAGNOSIS — J069 Acute upper respiratory infection, unspecified: Secondary | ICD-10-CM | POA: Insufficient documentation

## 2010-06-18 LAB — CONVERTED CEMR LAB: POC INR: 2.7

## 2010-06-23 HISTORY — PX: FINGER SURGERY: SHX640

## 2010-07-12 ENCOUNTER — Other Ambulatory Visit: Payer: Self-pay | Admitting: Pulmonary Disease

## 2010-07-12 DIAGNOSIS — R591 Generalized enlarged lymph nodes: Secondary | ICD-10-CM

## 2010-07-15 ENCOUNTER — Ambulatory Visit
Admission: RE | Admit: 2010-07-15 | Discharge: 2010-07-15 | Payer: Self-pay | Source: Home / Self Care | Attending: Internal Medicine | Admitting: Internal Medicine

## 2010-07-15 ENCOUNTER — Other Ambulatory Visit: Payer: Self-pay | Admitting: Internal Medicine

## 2010-07-15 ENCOUNTER — Other Ambulatory Visit: Payer: Self-pay

## 2010-07-15 ENCOUNTER — Ambulatory Visit: Admission: RE | Admit: 2010-07-15 | Discharge: 2010-07-15 | Payer: Self-pay | Source: Home / Self Care

## 2010-07-15 LAB — BASIC METABOLIC PANEL
BUN: 17 mg/dL (ref 6–23)
CO2: 30 mEq/L (ref 19–32)
Calcium: 9.1 mg/dL (ref 8.4–10.5)
Chloride: 99 mEq/L (ref 96–112)
Creatinine, Ser: 0.9 mg/dL (ref 0.4–1.5)
GFR: 90.69 mL/min (ref >60.00)
Glucose, Bld: 88 mg/dL (ref 70–99)
Potassium: 4.9 mEq/L (ref 3.5–5.1)
Sodium: 136 mEq/L (ref 135–145)

## 2010-07-15 LAB — CBC WITH DIFFERENTIAL/PLATELET
Basophils Absolute: 0 10*3/uL (ref 0.0–0.1)
Basophils Relative: 0.6 % (ref 0.0–3.0)
Eosinophils Absolute: 0.6 10*3/uL (ref 0.0–0.7)
Eosinophils Relative: 7.5 % — ABNORMAL HIGH (ref 0.0–5.0)
HCT: 46.2 % (ref 39.0–52.0)
Hemoglobin: 16.2 g/dL (ref 13.0–17.0)
Lymphocytes Relative: 28.1 % (ref 12.0–46.0)
Lymphs Abs: 2.2 10*3/uL (ref 0.7–4.0)
MCHC: 35 g/dL (ref 30.0–36.0)
MCV: 93.4 fl (ref 78.0–100.0)
Monocytes Absolute: 0.8 10*3/uL (ref 0.1–1.0)
Monocytes Relative: 10.5 % (ref 3.0–12.0)
Neutro Abs: 4.2 10*3/uL (ref 1.4–7.7)
Neutrophils Relative %: 53.3 % (ref 43.0–77.0)
Platelets: 294 10*3/uL (ref 150.0–400.0)
RBC: 4.95 Mil/uL (ref 4.22–5.81)
RDW: 12.7 % (ref 11.5–14.6)
WBC: 7.8 10*3/uL (ref 4.5–10.5)

## 2010-07-15 LAB — HEPATIC FUNCTION PANEL
ALT: 30 U/L (ref 0–53)
AST: 24 U/L (ref 0–37)
Albumin: 4 g/dL (ref 3.5–5.2)
Alkaline Phosphatase: 58 U/L (ref 39–117)
Bilirubin, Direct: 0.2 mg/dL (ref 0.0–0.3)
Total Bilirubin: 1.2 mg/dL (ref 0.3–1.2)
Total Protein: 7.6 g/dL (ref 6.0–8.3)

## 2010-07-15 LAB — URINALYSIS
Bilirubin Urine: NEGATIVE
Ketones, ur: NEGATIVE
Leukocytes, UA: NEGATIVE
Nitrite: NEGATIVE
Specific Gravity, Urine: 1.015 (ref 1.000–1.030)
Total Protein, Urine: NEGATIVE
Urine Glucose: NEGATIVE
Urobilinogen, UA: 0.2 (ref 0.0–1.0)
pH: 5 (ref 5.0–8.0)

## 2010-07-15 LAB — LIPID PANEL
Cholesterol: 221 mg/dL — ABNORMAL HIGH (ref 0–200)
HDL: 30.8 mg/dL — ABNORMAL LOW (ref >39.00)
Total CHOL/HDL Ratio: 7
Triglycerides: 205 mg/dL — ABNORMAL HIGH (ref 0.0–149.0)
VLDL: 41 mg/dL — ABNORMAL HIGH (ref 0.0–40.0)

## 2010-07-15 LAB — TSH: TSH: 2.41 u[IU]/mL (ref 0.35–5.50)

## 2010-07-15 LAB — CONVERTED CEMR LAB: POC INR: 2.5

## 2010-07-15 LAB — PSA: PSA: 0.11 ng/mL (ref 0.10–4.00)

## 2010-07-15 LAB — VITAMIN B12: Vitamin B-12: 241 pg/mL (ref 211–911)

## 2010-07-15 LAB — LDL CHOLESTEROL, DIRECT: Direct LDL: 159.7 mg/dL

## 2010-07-23 ENCOUNTER — Ambulatory Visit
Admission: RE | Admit: 2010-07-23 | Discharge: 2010-07-23 | Payer: Self-pay | Source: Home / Self Care | Attending: Internal Medicine | Admitting: Internal Medicine

## 2010-07-23 DIAGNOSIS — E538 Deficiency of other specified B group vitamins: Secondary | ICD-10-CM | POA: Insufficient documentation

## 2010-07-23 NOTE — Medication Information (Signed)
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  Has some bruising but no bleeding.  Did not hit head  Bleeding/hemorrhagic complications: no    Recent/future hospitalizations: no    Any changes in medication regimen? no    Recent/future dental: no  Any missed doses?: no       Is patient compliant with meds? yes       Allergies: 1)  ! Pcn 2)  ! Sulfa  Anticoagulation Management History:      The patient is taking warfarin and comes in today for a routine follow up visit.  Negative risk factors for bleeding include an age less than 76 years old.  The bleeding index is 'low risk'.  Negative CHADS2 values include Age > 67 years old.  The start date was 10/31/2002.  His last INR was 2.3 RATIO.  Anticoagulation responsible provider: Myrtis Ser MD, Tinnie Gens.  INR POC: 3.6.  Cuvette Lot#: [id number].  Exp: 03/2010.    Anticoagulation Management Assessment/Plan:      The patient's current anticoagulation dose is Coumadin 6 mg  tabs: Take as directed by coumadin clinic..  The target INR is 2 - 3.  The next INR is due 05/09/2009.  Anticoagulation instructions were given to patient.  Results were reviewed/authorized by Weston Brass, PharmD.  He was notified by Weston Brass PharmD.         Prior Anticoagulation Instructions: INR 3.8  Do NOT take coumadin today (Wednesday). Then return to normal dosing schedule of 1 tablet (6 mg) daily. Return to clinic in 2 weeks.  Current Anticoagulation Instructions: INR 3.6  Skip today's dose then resume same dose of 1 tablet every day

## 2010-07-23 NOTE — Miscellaneous (Signed)
  Clinical Lists Changes  Observations: Added new observation of PNEUMOVAX: Historical (04/04/2008 9:14)      Immunization History:  Pneumovax Immunization History:    Pneumovax:  historical (04/04/2008)

## 2010-07-23 NOTE — Medication Information (Signed)
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 yes       Details: recent 10 day course of doxycycline, pt is now finished with abx  Recent/future dental: no  Any missed doses?: no       Is patient compliant with meds? yes       Current Medications (verified): 1)  Coumadin 6 Mg  Tabs (Warfarin Sodium) .... Take As Directed By Coumadin Clinic. 2)  Furosemide 20 Mg Tabs (Furosemide) .[name].. 1 Qam 3)  Vitamin B-12 1000 Mcg  Tabs (Cyanocobalamin) .[name].. 1 Qd 4)  Proventil Hfa 108 (90 Base) Mcg/act Aers (Albuterol Sulfate) .... 2 Inh Qid As Needed  Allergies (verified): 1)  ! Pcn 2)  ! Sulfa  Anticoagulation Management History:      Negative risk factors for bleeding include an age less than 66 years old.  The bleeding index is 'low risk'.  Negative CHADS2 values include Age > 69 years old.  The start date was 10/31/2002.  His last INR was 2.3 RATIO.  Anticoagulation responsible provider: Shirlee Latch MD, Rollin Kotowski.  INR POC: 3.8.  Exp: 03/2010.    Anticoagulation Management Assessment/Plan:      The patient's current anticoagulation dose is Coumadin 6 mg  tabs: Take as directed by coumadin clinic..  The target INR is 2 - 3.  The next INR is due 04/18/2009.  Anticoagulation instructions were given to patient.  Results were reviewed/authorized by Eda Keys, PharmD.  He was notified by Eda Keys.         Prior Anticoagulation Instructions: INR 3.6  Skip today's dose of coumadin then resume 6mg  days.  Recheck in 3 weeks.    Current Anticoagulation  Instructions: INR 3.8  Do NOT take coumadin today (Wednesday). Then return to normal dosing schedule of 1 tablet (6 mg) daily. Return to clinic in 2 weeks.

## 2010-07-23 NOTE — Medication Information (Signed)
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 no    Recent/future dental: no  Any missed doses?: no       Is patient compliant with meds? yes       Current Medications (verified): 1)  Coumadin 6 Mg  Tabs (Warfarin Sodium) .... Take As Directed By Coumadin Clinic. 2)  Furosemide 20 Mg Tabs (Furosemide) .[name].. 1 Qam Po 3)  Proventil Hfa 108 (90 Base) Mcg/act Aers (Albuterol Sulfate) .... 2 Inh Qid As Needed 4)  Vitamin D3 1000 Unit  Tabs (Cholecalciferol) .[name].. 1 By Mouth Daily 5)  Vitamin B-12 1000 Mcg  Tabs (Cyanocobalamin) .[name].. 1 Qd 6)  Zyrtec Allergy 10 Mg Tbdp (Cetirizine Hcl) .[name].. 1 By Mouth Qd 7)  Promethazine-Codeine 6.25-10 Mg/23ml Syrp (Promethazine-Codeine) .... 5-10 Ml By Mouth Q Id As Needed Cough 8)  Lomotil 2.5-0.025 Mg Tabs (Diphenoxylate-Atropine) .[name].. 1-2 By Mouth Qid As Needed Diarrhea 9)  Align  Caps (Probiotic Product) .[name].. 1 By Mouth Once Daily For Your Intesinal Flora Restoraion 10)  Omega-3 Cf 1000 Mg Caps (Omega-3 Fatty Acids) .[name].. 1 Once Daily 11)  Glucosamine 500 Mg Caps (Glucosamine Sulfate) .[name].. 1 Once Daily 12)  Niacin 500 Mg Tabs (Niacin) .[name].. 1 Once Daily 13)  B Complex  Tabs (B Complex Vitamins) .[name].. 1 Once Daily  Allergies (verified): 1)  ! Pcn 2)  ! Sulfa  Anticoagulation Management History:      The patient is taking warfarin and comes in today for a routine follow up visit.  Negative risk factors for bleeding include an age less than 59 years old.  The bleeding index is 'low risk'.  Negative CHADS2 values include Age > 50  years old.  The start date was 10/31/2002.  His last INR was 2.7 ratio.  Anticoagulation responsible provider: Eden Emms MD, Theron Arista.  INR POC: 2.2.  Cuvette Lot#: [id number].  Exp: 03/24/2011.    Anticoagulation Management Assessment/Plan:      The patient's current anticoagulation dose is Coumadin 6 mg  tabs: Take as directed by coumadin clinic..  The target INR is 2 - 3.  The next INR is due 04/29/2010.  Anticoagulation instructions were given to patient.  Results were reviewed/authorized by Reina Fuse, PharmD.  He was notified by Reina Fuse PharmD.         Prior Anticoagulation Instructions: INR 2.7 Continue same dosages of 1 tablet daily except a 1/2 tablet on friday. See you in 4 weeks.  Current Anticoagulation Instructions: INR 2.2  Continue taking Coumadin 1 tab (6 mg) on all days except Coumadin 0.5 tab (3 mg) on Fridays. Return to clinic in 4 weeks.

## 2010-07-23 NOTE — Assessment & Plan Note (Signed)
Summary: skin bx/#/cd   Vital Signs:  Patient profile:   60 year old male Height:      71 inches Weight:      311 pounds BMI:     43.53 Temp:     97.1 degrees F oral Pulse rate:   84 / minute Pulse rhythm:   regular Resp:     16 per minute BP sitting:   130 / 88  (left arm) Cuff size:   large  Vitals Entered By: Lanier Prude, Beverly Gust) (February 13, 2010 8:36 AM)  Procedure Note  Biopsy: The patient complains of changing mole. Indication: changing lesion Consent signed: yes  Procedure # 1: shave biopsy    Size (in cm): 1.2 x 0.9    Region: palmar    Location: R forearm    Comment: Risks including but not limited by incomplete procedure, bleeding, infection, recurrence were discussed with the patient. Consent form was signed.     Instrument used: dermablade    Anesthesia: 1.0 ml 1% lidocaine w/epinephrine  Cleaned and prepped with: alcohol and betadine Wound dressing: neosporin and bandaid Instructions: daily dressing changes  CC: skin lesion on Rt arm Is Patient Diabetic? No Comments pt is not taking Align   CC:  skin lesion on Rt arm.  History of Present Illness: The patient presents for a follow up of CP, leg pain. Feeling better. Comes for a skin biopsy   Current Medications (verified): 1)  Coumadin 6 Mg  Tabs (Warfarin Sodium) .... Take As Directed By Coumadin Clinic. 2)  Furosemide 20 Mg Tabs (Furosemide) .[name].. 1 Qam Po 3)  Proventil Hfa 108 (90 Base) Mcg/act Aers (Albuterol Sulfate) .... 2 Inh Qid As Needed 4)  Vitamin D3 1000 Unit  Tabs (Cholecalciferol) .[name].. 1 By Mouth Daily 5)  Vitamin B-12 1000 Mcg  Tabs (Cyanocobalamin) .[name].. 1 Qd 6)  Zyrtec Allergy 10 Mg Tbdp (Cetirizine Hcl) .[name].. 1 By Mouth Qd 7)  Promethazine-Codeine 6.25-10 Mg/26ml Syrp (Promethazine-Codeine) .... 5-10 Ml By Mouth Q Id As Needed Cough 8)  Lomotil 2.5-0.025 Mg Tabs (Diphenoxylate-Atropine) .[name].. 1-2 By Mouth Qid As Needed Diarrhea 9)  Align  Caps (Probiotic Product) .[name].. 1 By Mouth  Once Daily For Your Intesinal Flora Restoraion 10)  Omega-3 Cf 1000 Mg Caps (Omega-3 Fatty Acids) .[name].. 1 Once Daily 11)  Glucosamine 500 Mg Caps (Glucosamine Sulfate) .[name].. 1 Once Daily 12)  Niacin 500 Mg Tabs (Niacin) .[name].. 1 Once Daily 13)  B Complex  Tabs (B Complex Vitamins) .[name].. 1 Once Daily  Allergies (verified): 1)  ! Pcn 2)  ! 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 Family History High cholesterol Family History Hypertension Parkinson's  Social History: Reviewed history from 04/23/2007 and no changes required. Occupation: disabled Married Never Smoked Alcohol use-no  Review of Systems       The patient complains of peripheral edema.  The patient denies fever, chest pain, and dyspnea on exertion.    Physical Exam  General:  overweight-appearing.   Nose:   Nasal mucosa are pink and moist without lesions or exudates. Mouth:  Erythematous throat mucosa and intranasal erythema.  Neck:  No deformities, masses, or tenderness noted. Lungs:  Normal respiratory effort,  chest expands symmetrically. Lungs are clear to auscultation, no crackles or wheezes. Heart:  Normal rate and regular rhythm. S1 and S2 normal without gallop, murmur, click, rub or other extra sounds. Abdomen:  Bowel sounds positive,abdomen soft and non-tender without masses, organomegaly or hernias noted. Msk:  Lumbar-sacral spine is tender to palpation over paraspinal muscles and painfull with the ROM  R hip tender w/flexion and internal rotation  Extremities:  trace right pedal edema.  L nl Neurologic:  No cranial nerve deficits noted. Station  and gait are normal. Plantar reflexes are down-going bilaterally. DTRs are symmetrical throughout. Sensory, motor and coordinative functions appear intact. Skin:  R distal forearm w/pigmented growth Psych:  Cognition and judgment appear intact. Alert and cooperative with normal attention span and concentration. No apparent delusions, illusions, hallucinations   Impression & Recommendations:  Problem # 1:  CHEST PAIN (ICD-786.50) Assessment Improved Labs, EKG, CT reviewed w/pt  Problem # 2:  CHEST XRAY, ABNORMAL (ICD-793.1)/CT  Assessment: New Repeat CT in 3-6 months - discussed w/Dr Hen  Problem # 3:  NEOPLASM OF UNCERTAIN BEHAVIOR OF SKIN (ICD-238.2) Assessment: Unchanged  Orders: Shave Skin Lesion 1.1-2.0 cm/trunk/arm/leg (36644)  Problem # 4:  EDEMA (ICD-782.3) Assessment: Unchanged  His updated medication list for this problem includes:    Furosemide 20 Mg Tabs (Furosemide) .[name]... 1 qam po  Complete Medication List: 1)  Coumadin 6 Mg Tabs (Warfarin sodium) .... Take as directed by coumadin clinic. 2)  Furosemide 20 Mg Tabs (Furosemide) .[name].. 1 qam po 3)  Proventil Hfa 108 (90 Base) Mcg/act Aers (Albuterol sulfate) .... 2 inh qid as needed 4)  Vitamin D3 1000 Unit Tabs (Cholecalciferol) .[name].. 1 by mouth daily 5)  Vitamin B-12 1000 Mcg Tabs (Cyanocobalamin) .[name].. 1 qd 6)  Zyrtec Allergy 10 Mg Tbdp (Cetirizine hcl) .[name].. 1 by mouth qd 7)  Promethazine-codeine 6.25-10 Mg/36ml Syrp (Promethazine-codeine) .... 5-10 ml by mouth q id as needed cough 8)  Lomotil 2.5-0.025 Mg Tabs (Diphenoxylate-atropine) .[name].. 1-2 by mouth qid as needed diarrhea 9)  Align Caps (Probiotic product) .[name].. 1 by mouth once daily for your intesinal flora restoraion 10)  Omega-3 Cf 1000 Mg Caps (Omega-3 fatty acids) .[name].. 1 once daily 11)  Glucosamine 500 Mg Caps (Glucosamine sulfate) .[name].. 1 once daily 12)  Niacin 500 Mg Tabs (Niacin) .[name].. 1 once daily 13)  B Complex Tabs (B complex vitamins) .[name].. 1 once  daily  Other Orders: Admin 1st Vaccine (03474) Flu Vaccine 29yrs + [phone])    Other Orders: Admin 1st Vaccine (38756) Flu Vaccine 23yrs + (43329)  Patient Instructions: 1)  Please schedule a follow-up appointment in 2 months. 2)  Call if problems  .lbflu   Flu Vaccine Consent Questions     Do you have a history of severe allergic reactions to this vaccine? no    Any prior history of allergic reactions to egg and/or gelatin? no    Do you have a sensitivity to the preservative Thimersol? no    Do you have a past history of Guillan-Barre Syndrome? no    Do you currently have an acute febrile illness? no    Have you ever had a severe reaction to latex? no    Vaccine information given and explained to patient? yes    Are you currently pregnant? no    Lot Number:[id number]   Exp Date:12/21/2010   Site Given  Left Deltoid IM.Marland KitchenMarland KitchenMarland KitchenLanier Prude, [facility])  February 13, 2010 9:49 AM   Appended Document: skin bx/#/cd  Clinical Lists Changes  Orders: Added new Service order of Specimen Handling ([address]) - Signed

## 2010-07-23 NOTE — Medication Information (Signed)
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 no    Recent/future dental: no  Any missed doses?: no       Is patient compliant with meds? yes       Current Medications (verified): 1)  Coumadin 6 Mg  Tabs (Warfarin Sodium) .... Take As Directed By Coumadin Clinic. 2)  Furosemide 20 Mg Tabs (Furosemide) .[name].. 1 Qam Po 3)  Proventil Hfa 108 (90 Base) Mcg/act Aers (Albuterol Sulfate) .... 2 Inh Qid As Needed 4)  Vitamin D3 1000 Unit  Tabs (Cholecalciferol) .[name].. 1 By Mouth Daily 5)  Vitamin B-12 1000 Mcg  Tabs (Cyanocobalamin) .[name].. 1 Qd  Allergies (verified): 1)  ! Pcn 2)  ! Sulfa  Anticoagulation Management History:      The patient is taking warfarin and comes in today for a routine follow up visit.  Negative risk factors for bleeding include an age less than 22 years old.  The bleeding index is 'low risk'.  Negative CHADS2 values include Age > 67 years old.  The start date was 10/31/2002.  His last INR was 2.3 RATIO.  Anticoagulation responsible provider: Shirlee Latch MD, Dalton.  INR POC: 3.2.  Exp: 09/2010.    Anticoagulation Management Assessment/Plan:      The patient's current anticoagulation dose is Coumadin 6 mg  tabs: Take as directed by coumadin clinic..  The target INR is 2 - 3.  The next INR is due 10/04/2009.  Anticoagulation instructions were given to patient.  Results were reviewed/authorized by Leota Sauers, PharmD, BCPS, CPP.         Prior Anticoagulation Instructions: INR 2.6  Continue current dosing  schedule.  Take 3 mg on Friday and take 6 mg all other days.  Return to clinic in 4 weeks.  Current Anticoagulation Instructions: INR 3.2  eat greens tonight  6mg  = 1 tab each day excpet 1/2 tab = 3mg  on Fri

## 2010-07-23 NOTE — Medication Information (Signed)
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 no    Recent/future dental: no  Any missed doses?: yes     Details: missed 5/26 and 6/1  Is patient compliant with meds? yes       Current Medications (verified): 1)  Coumadin 6 Mg  Tabs (Warfarin Sodium) .... As Dirr. 2)  Furosemide 20 Mg Tabs (Furosemide) .[name].. 1 Qam 3)  Vitamin B-12 1000 Mcg  Tabs (Cyanocobalamin) .[name].. 1 Qd 4)  Proventil Hfa 108 (90 Base) Mcg/act Aers (Albuterol Sulfate) .... 2 Inh Qid As Needed  Allergies (verified): 1)  ! Pcn 2)  ! Sulfa  Anticoagulation Management History:      The patient is on coumadin and comes in today for a routine follow up visit.  Negative risk factors for bleeding include an age less than 22 years old.  The bleeding index is 'low risk'.  Negative CHADS2 values include Age > 58 years old.  The start date was 10/31/2002.  His last INR was 2.3 RATIO.    Anticoagulation Management Assessment/Plan:      The patient's current anticoagulation dose is Coumadin 6 mg  tabs: as dirr.[name]  He is to have a 12/29/2008.  Anticoagulation instructions were given to patient.  Results were reviewed/authorized by Bethena Midget, RN, BSN.  He was notified by Bethena Midget, RN, BSN.         Prior Anticoagulation Instructions: 6 MG QD  Current Anticoagulation Instructions: INR 2.2 Continue 6mg s daily

## 2010-07-23 NOTE — Progress Notes (Signed)
Summary: Lovenox Bridge for Dental Procedure  ---- Converted from flag ---- ---- 05/22/2010 7:47 AM, Georgina Quint Starleen Trussell MD wrote: It is OK - Thank you!   ---- 05/21/2010 12:25 PM, Weston Brass PharmD wrote: Received fax from Dr. Ocie Doyne (oral surgeon).  Pt needs to have extration done under local/nitrous sedation.  They need INR betwee 1.5-2 for pt.  Is this okay?  Would you like [place] to bridge him with Lovenox.   Thanks! ------------------------------  Phone Note Outgoing Call   Call placed by: Weston Brass PharmD,  May 23, 2010 2:57 PM Summary of Call: Spoke with pt's wife to let her know Dr. Posey Rea had cleared pt to have procedure.  She stated no date was set yet but patient was in lots of pain.  Called Dr. Lorin Picket Jensen's office.  Spoke with Selena Batten.  She stated Dr. Barbette Merino will have to review clearance before pt can be scheduled.  He will be back in the office on Monday.  The earliest appt they have is 06/03/10.  Spoke with pt's wife.  She is aware to call us once appt is scheduled.  Initial call taken by: Weston Brass PharmD,  May 23, 2010 3:09 PM     Appended Document: Lovenox Bridge for Dental Procedure Plan for Lovenox Bridge:  12/8- Last dose of Coumadin 12/9- No Coumadin or Lovenox 12/10- Lovenox 150mg  subcutaneously once daily  12/11- Lovenox  12/12- Day of Procedure.  Will check INR in AM prior to dental work.   Clinical Lists Changes  Medications: Added new medication of ENOXAPARIN SODIUM 150 MG/ML SOLN (ENOXAPARIN SODIUM) Inject 1 syringe subcutaneously two times a day as directed - Signed Rx of ENOXAPARIN SODIUM 150 MG/ML SOLN (ENOXAPARIN SODIUM) Inject 1 syringe subcutaneously two times a day as directed;  #10 x 0;  Signed;  Entered by: Weston Brass PharmD;  Authorized by: Colon Branch, MD, [facility];  Method used: Electronically to [facility]*, [address], [place], [place]  [address], Ph: [phone]  or [phone], Fax: [phone]    Prescriptions: ENOXAPARIN SODIUM 150 MG/ML SOLN (ENOXAPARIN SODIUM) Inject 1 syringe subcutaneously two times a day as directed  #10 x 0   Entered by:   Weston Brass PharmD   Authorized by:   Colon Branch, MD, [facility]   Signed by:   Weston Brass PharmD on 05/28/2010   Method used:   Electronically to        [facility]* (retail)       [address] [place], [place]  [address]       Ph: [phone] or [phone]       Fax: [phone]   RxID:   [id number]

## 2010-07-23 NOTE — Medication Information (Signed)
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  Bleeding/hemorrhagic complications: no    Recent/future hospitalizations: no    Any changes in medication regimen? yes       Details: Doxycycline 100 mg BID x 1 week.  MD gave him prescription to take as needed for 10 days at a time.  Recent/future dental: no  Any missed doses?: no       Is patient compliant with meds? yes      Comments: Instructed pt if SOB increased or caused chest pain to contact MD.  Pt very resistant to going to doctor or hospital for symptoms.  Current Medications (verified): 1)  Coumadin 6 Mg  Tabs (Warfarin Sodium) .... Take As Directed By Coumadin Clinic. 2)  Furosemide 20 Mg Tabs (Furosemide) .[name].. 1 Qam Po 3)  Proventil Hfa 108 (90 Base) Mcg/act Aers (Albuterol Sulfate) .... 2 Inh Qid As Needed 4)  Vitamin D3 1000 Unit  Tabs (Cholecalciferol) .[name].. 1 By Mouth Daily 5)  Vitamin B-12 1000 Mcg  Tabs (Cyanocobalamin) .[name].. 1 Qd 6)  Doxycycline Hyclate 100 Mg Caps (Doxycycline Hyclate) .[name].. 1 Cap By Mouth Two Times A Day  Allergies: 1)  ! Pcn 2)  ! Sulfa  Anticoagulation Management History:      The patient is taking warfarin and comes in today for a routine follow up visit.  Negative risk factors for bleeding include an age less than 60 years old.  The bleeding index is 'low risk'.  Negative CHADS2 values include Age > 66 years old.  The start date was 10/31/2002.  His last INR was 2.3 RATIO.  Anticoagulation responsible provider: Eden Emms MD, Theron Arista.  INR POC: 2.3.  Cuvette Lot#: [id number].  Exp: 10/2010.    Anticoagulation  Management Assessment/Plan:      The patient's current anticoagulation dose is Coumadin 6 mg  tabs: Take as directed by coumadin clinic..  The target INR is 2 - 3.  The next INR is due 10/12/2009.  Anticoagulation instructions were given to patient.  Results were reviewed/authorized by Elaina Pattee, PharmD.  He was notified by Elaina Pattee, PharmD.         Prior Anticoagulation Instructions: INR 3.2  eat greens tonight  6mg  = 1 tab each day excpet 1/2 tab = 3mg  on Fri  Current Anticoagulation Instructions: INR 2.3. Take 1 tablet daily except 0.5 tablet on Fridays.

## 2010-07-23 NOTE — Progress Notes (Signed)
Summary: VOMITTING  Phone Note Call from Patient Call back at [phone]   Caller: MELINDA-WIFE Summary of Call: PATIENTS WIFE CALLED WITH CONCERNS OF PATIENT VOMITTING,DIARRHEA. PATIENT TAKES COUMADIN AND IS UNABLE TO KEEP MEDICATION DOWN, WIFE WOULD LIKE LOVONOX INJECTIONS CALLED IN TO [facility]. PER DR.Shanera Meske PATIENT NEEDS AN EVALUATION BEFORE ANY MEDICATION CAN BE PRESCRIBED, PATIENT WILL NEED PT LEVEL CHECK. PATIENT NEEDS TO GO TO EMERGENCY ROOM. WIFE STATED OVER AND OVER HE HAD SO MANY BAD EXPERIENCES @ THE EMERGENCY ROOM THAT HE REFUSES TO GO. MELINDA WAS VERY CONCERNED ABOUT HIS PT BEING OUT OF CONTROL. I EXPLAINED THAT HIS HEALTH IS IMPORTANT TO [place] ALSO AND HE WOULD RECIEVE PROPER TREATMENT AND PROPER TESTING COULD BE DONE IF HE GOES TO THE EMERGENCY ROOM NOW. MELINDA STATED OVER AND OVER HE WILL NOT GO, HE IS A GROWN MAN AND SHE CANT MAKE HIM GO SHE JUST NEEDS LOVONOX CALLED IN. SHE SAID IT WAS NO NEED FOR HER TO TELL PATIENT THAT WE SUGGEST EMERGENCY ROOM CAUSE HE  WILL NOT GO. WILL SEND TO DR.Khristie Sak TO NOTE AND SIGN OFF.(15 min on phone) Initial call taken by: Shonna Chock,  April 01, 2008 11:35 AM

## 2010-07-23 NOTE — Consult Note (Signed)
Summary: [facility]   Imported By: Maryln Gottron 05/11/2007 14:51:00  _____________________________________________________________________  External Attachment:    Type:   Image     Comment:   External Document

## 2010-07-23 NOTE — Progress Notes (Signed)
Summary: antibiotic  Phone Note Call from Patient   Summary of Call: pt called says that he picked something up when he was in the doctors office yesterday and he has bad congestion in his head that is moving to his chest. Wants to know can he have a antibiotic prescribed Initial call taken by: Windell Norfolk,  June 14, 2008 1:17 PM  Follow-up for Phone Call        OK Doxy Follow-up by: Tresa Garter MD,  June 14, 2008 5:01 PM  Additional Follow-up for Phone Call Additional follow up Details #1::        called pt to infor rx was sent left msg on vm Additional Follow-up by: Shelbie Proctor,  June 15, 2008 8:36 AM    New/Updated Medications: DOXYCYCLINE HYCLATE 100 MG CAPS (DOXYCYCLINE HYCLATE) Take 1 tab twice a day   Prescriptions: DOXYCYCLINE HYCLATE 100 MG CAPS (DOXYCYCLINE HYCLATE) Take 1 tab twice a day  #20 x 0   Entered and Authorized by:   Tresa Garter MD   Signed by:   Shelbie Proctor on 06/15/2008   Method used:   Electronically to        [facility]* (retail)       [address] [place], [place]  [address]       Ph: [phone] or [phone]       Fax: [phone]   RxID:   [id number]

## 2010-07-23 NOTE — Letter (Signed)
Summary: Paramedic   Imported By: Roderic Ovens 02/02/2009 12:34:41  _____________________________________________________________________  External Attachment:    Type:   Image     Comment:   External Document

## 2010-07-23 NOTE — Assessment & Plan Note (Signed)
Summary: 3 MO ROV /NWS $50   Vital Signs:  Patient Profile:   60 Years Old Male Weight:      287 pounds Temp:     97.9 degrees F oral Pulse rate:   68 / minute BP sitting:   136 / 94  (left arm)  Vitals Entered By: Tora Perches (June 13, 2008 8:53 AM)                 Chief Complaint:  Multiple medical problems or concerns.  History of Present Illness: The patient presents for a follow up of hypertension, LE edema, hyperlipidemia     Current Allergies (reviewed today): ! PCN ! 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         Nose bleeding   Physical Exam  General:     overweight-appearing.   Head:     Normocephalic and atraumatic without obvious abnormalities. No apparent alopecia or balding. Ears:     External ear exam shows no significant lesions or deformities.  Otoscopic examination reveals clear canals, tympanic membranes are intact bilaterally without bulging, retraction, inflammation or discharge. Hearing is grossly normal bilaterally. Mouth:     Dry mouth Neck:     No deformities, masses, or tenderness noted. Lungs:     Normal respiratory effort, chest expands symmetrically. Lungs are clear to auscultation, no crackles or wheezes. Heart:     Normal rate and regular rhythm. S1 and S2 normal without gallop, murmur, click, rub or other  extra sounds. Abdomen:     soft and non-tender.   Msk:     No deformity or scoliosis noted of thoracic or lumbar spine.   Extremities:     trace left pedal edema   trace left pedal edema and trace right pedal edema.   Skin:     Intact without suspicious lesions or rashes Psych:     Oriented X3.      Impression & Recommendations:  Problem # 1:  EDEMA (ICD-782.3) Assessment: Unchanged  His updated medication list for this problem includes:    Furosemide 20 Mg Tabs (Furosemide) .[name]... 1 qam   Problem # 2:  PULMONARY EMBOLISM, HX OF (ICD-V12.51) Assessment: Comment Only  His updated medication list for this problem includes:    Coumadin 6 Mg Tabs (Warfarin sodium) .[name]... As dirr.   Problem # 3:  LOW BACK PAIN (ICD-724.2) Assessment: Unchanged  Problem # 4:  ASTHMA (ICD-493.90) Assessment: Unchanged  His updated medication list for this problem includes:    Proventil Hfa  108 (90 Base) Mcg/act Aers (Albuterol sulfate) .[name]... 2 inh qid as needed   Complete Medication List: 1)  Coumadin 6 Mg Tabs (Warfarin sodium) .... As dirr. 2)  Furosemide 20 Mg Tabs (Furosemide) .[name].. 1 qam 3)  Vitamin B-12 1000 Mcg Tabs (Cyanocobalamin) .[name].. 1 qd 4)  Vitamin D3 1000 Unit Tabs (Cholecalciferol) .[name].. 1 qd 5)  Triamcinolone Acetonide 0.5 % Crea (Triamcinolone acetonide) .... Bid 6)  Proventil Hfa 108 (90 Base) Mcg/act Aers (Albuterol sulfate) .... 2 inh qid as needed   Patient Instructions: 1)  Please schedule a follow-up appointment in 3 months well. 2)  BMP prior to visit, ICD-9: 3)  Hepatic Panel prior to visit, ICD-9: 4)  Lipid Panel prior to visit, ICD-9: 5)  TSH prior to visit, ICD-9: 6)  CBC w/ Diff prior to visit, ICD-9: 7)  Urine-dip prior to visit, ICD-9: 8)  PSA prior to visit, ICD-9:v70.0   Prescriptions: PROVENTIL HFA 108 (90 BASE) MCG/ACT AERS (ALBUTEROL SULFATE) 2 inh qid as needed  #1 x 3   Entered and Authorized by:   Tresa Garter MD   Signed by:   Tresa Garter MD on 06/13/2008   Method used:   Print then Give to Patient   RxID:   [id number] FUROSEMIDE 20 MG TABS (FUROSEMIDE) 1 qam  #90 x 3   Entered and Authorized by:   Tresa Garter MD   Signed by:   Tresa Garter MD on 06/13/2008   Method used:   Print then Give to Patient   RxID:   [id number] COUMADIN 6 MG  TABS (WARFARIN SODIUM) as dirr.  #90 x 3   Entered and Authorized by:   Tresa Garter MD   Signed by:   Tresa Garter MD on 06/13/2008   Method used:   Print then Give to Patient   RxID:   [id number]  ]

## 2010-07-23 NOTE — Assessment & Plan Note (Signed)
Summary: ROV 2 WKS ///KP   Visit Type:  Follow-up Copy to:  pcp Primary Provider/Referring Provider:  Tresa Garter MD  CC:  follow-up visit.  History of Present Illness: 59/M, never smoker referred for evaluation of abnormal imaging. He underwent CT angio for increase dyspnea & chest pain on 8/19 /11 which was neg for PE. Showed 7mm nodule in the RLL which was stable from 2003. 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 No other mediastinal LN was noted. He is concerned that these findings may be related to his exposure to asbestos. 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 He has been maintianed on coumadin since , plan lifelong per his d/w Dr Sung Amabile then. He woeked on a wall covered with asbestos for  a week when with the railroad. His intermittent dyspnea has been labelled as 'asthma'  May 30, 2010 4:46 PM  Doppler shows - chronic clot in rt SFV to popliteal vein negative PET scan  - OK to proceed w ith dental surgery  >. lovenox to coumadin bridge planned He denies hemoptysis, cough , fevers, chills, weight loss.  Preventive Screening-Counseling & Management  Alcohol-Tobacco     Smoking Status: never  Current Medications (verified): 1)  Coumadin 6 Mg  Tabs (Warfarin Sodium) .... Take As Directed By Coumadin Clinic. 2)  Furosemide 20 Mg Tabs (Furosemide) .[name].. 1 Qam Po 3)  Proventil Hfa 108 (90 Base) Mcg/act Aers (Albuterol Sulfate) .... 2 Inh Qid As Needed 4)  Vitamin D3 1000 Unit  Tabs (Cholecalciferol) .[name].. 1 By Mouth Daily 5)  Zyrtec Allergy 10 Mg Tbdp (Cetirizine Hcl) .[name].. 1 By Mouth Once Daily Seasonal 6)  Promethazine-Codeine 6.25-10 Mg/47ml Syrp (Promethazine-Codeine) .... 5-10 Ml  By Mouth Q Id As Needed Cough 7)  Lomotil 2.5-0.025 Mg Tabs (Diphenoxylate-Atropine) .[name].. 1-2 By Mouth Qid As Needed Diarrhea 8)  Omega-3 Cf 1000 Mg Caps (Omega-3 Fatty Acids) .[name].. 1 Once Daily 9)  Glucosamine 500 Mg Caps (Glucosamine Sulfate) .[name].. 1 Once Daily 10)  Niacin 500 Mg Tabs (Niacin) .[name].. 1 Once Daily 11)  B Complex  Tabs (B Complex Vitamins) .[name].. 1 Once Daily 12)  Enoxaparin Sodium 150 Mg/ml Soln (Enoxaparin Sodium) .... Inject 1 Syringe Subcutaneously Two Times A Day As Directed 13)  Augmentin 875-125 Mg Tabs (Amoxicillin-Pot Clavulanate) .... Take 1 Tablet By Mouth Three Times A Day  Allergies (verified): 1)  ! Pcn 2)  ! 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 Hyperlipidemia Osteoarthritis Obesity  Social History: Last updated: 04/23/2007 Occupation: disabled Married Never Smoked Alcohol use-no  Review of Systems  The patient denies anorexia, fever, weight loss, weight gain, vision loss, decreased hearing, hoarseness, chest pain, syncope, dyspnea on exertion, peripheral edema, prolonged cough, headaches, hemoptysis, abdominal pain, melena, hematochezia, severe indigestion/heartburn, hematuria, muscle weakness, suspicious skin lesions, transient blindness, difficulty walking, depression, unusual weight change, abnormal bleeding, enlarged lymph nodes, and angioedema.    Vital Signs:  Patient profile:   60 year old male Height:      71 inches Weight:  309 pounds BMI:     43.25 O2 Sat:      95 % on Room air Temp:     98.1 degrees F oral Pulse rate:   79 / minute BP sitting:   128 / 80  (left arm) Cuff size:   large  Vitals Entered By: Zackery Barefoot CMA (May 30, 2010 4:33 PM)  O2 Flow:  Room air CC: follow-up visit Comments Medications reviewed with patient Verified contact number and pharmacy with  patient Zackery Barefoot [facility]  May 30, 2010 4:34 PM    Physical Exam  Additional Exam:  Gen. Pleasant, obese, in no distress, normal affect ENT - no lesions, no post nasal drip, class 3 airway Neck: No JVD, no thyromegaly, no carotid bruits Lungs: no use of accessory muscles, no dullness to percussion, clear without rales or rhonchi  Cardiovascular: Rhythm regular, heart sounds  normal, no murmurs or gallops, no peripheral edema Musculoskeletal: No deformities, no cyanosis or clubbing, hose in RLE      Impression & Recommendations:  Problem # 1:  LYMPHADENOPATHY (ICD-785.6) FU CT in 6 m Low suspicion for malignancy in this never smoker, more likely related to old granulomatous disease Serail FU x 2 yrs to document stability recommended  Orders: Radiology Referral (Radiology) Est. 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 His updated medication list for this problem includes:    Coumadin 6 Mg Tabs (Warfarin sodium) .[name]... Take as directed by coumadin clinic.    Enoxaparin Sodium 150 Mg/ml Soln (Enoxaparin sodium) ..... Inject 1 syringe subcutaneously two times a day as directed  Orders: Est. Patient Level III (10932)  Medications Added to Medication List This Visit: 1)  Augmentin 875-125 Mg Tabs (Amoxicillin-pot clavulanate) .... Take 1 tablet by mouth three times a day  Patient Instructions: 1)  Copy sent to: Dr Posey Rea 2)  Please schedule a follow-up appointment in 6 months after CT scan 3)  A Chest CT with Contrast has been recommended.  Your imaging study may require preauthorization.  4)  2 years of FU of lymph nodes would be recommended to document stability   Immunization History:  Tetanus/Td Immunization History:    Tetanus/Td:  historical (06/27/2002)

## 2010-07-23 NOTE — Progress Notes (Signed)
Summary: Abcess  Phone Note Call from Patient   Summary of Call: Pt has been dx w/tooth abcess. Could this have caused sob, chest pain and fatigue that pt was evaluated for at last office visit?  Initial call taken by: Lamar Sprinkles, CMA,  Oct 25, 2009 11:39 AM  Follow-up for Phone Call        I doubt, however, f all symptoms resolve after abscess treatment - then one can say the sx  were related to that abscess Follow-up by: Tresa Garter MD,  Oct 25, 2009 12:28 PM  Additional Follow-up for Phone Call Additional follow up Details #1::        Pt's wife  informed  Additional Follow-up by: Lamar Sprinkles, CMA,  Oct 25, 2009 1:23 PM

## 2010-07-23 NOTE — Progress Notes (Signed)
Summary: REFERRAL?   Phone Note Call from Patient Call back at [facility] Phone [phone]   Summary of Call: Pt wants to know if he should also have doppler of leg to check for blood clots. Pt thinks this was already ordered and wants to have at the same time as CT tomorrow. Please advise, I do not see order in EMR.  Initial call taken by: Lamar Sprinkles, CMA,  February 07, 2010 12:15 PM  Follow-up for Phone Call        Pls do CT first Labs were negative for clot and Heart attack - good news! Follow-up by: Tresa Garter MD,  February 07, 2010 1:52 PM  Additional Follow-up for Phone Call Additional follow up Details #1::        Pt's wife informed  Additional Follow-up by: Lamar Sprinkles, CMA,  February 07, 2010 4:11 PM

## 2010-07-23 NOTE — Progress Notes (Signed)
  Phone Note Call from Patient   Summary of Call: Patient had rx for antibiotic recently due to abcess tooth. He also had ear pressure at that time. Pt has recurrent ear pressure which started last week. Over weekend pt c/o bloody drainage from ear and continued pressure. Scheduled for office visit today for eval.  Initial call taken by: Lamar Sprinkles, CMA,  Nov 14, 2009 8:42 AM

## 2010-07-23 NOTE — Letter (Signed)
Summary: [facility]  [facility]   Imported By: Sherian Rein 05/14/2009 14:07:59  _____________________________________________________________________  External Attachment:    Type:   Image     Comment:   External Document

## 2010-07-23 NOTE — Assessment & Plan Note (Signed)
Summary: CPX / $50 /NWS   Vital Signs:  Patient profile:   60 year old male Weight:      302 pounds Temp:     97.1 degrees F oral Pulse rate:   83 / minute BP sitting:   140 / 82  (left arm)  Vitals Entered By: Tora Perches (May 15, 2009 8:54 AM) CC: cpx Is Patient Diabetic? No   CC:  cpx.  History of Present Illness: The patient presents for a wellness examination   Preventive Screening-Counseling & Management  Alcohol-Tobacco     Smoking Status: never  Current Medications (verified): 1)  Coumadin 6 Mg  Tabs (Warfarin Sodium) .... Take As Directed By Coumadin Clinic. 2)  Furosemide 20 Mg Tabs (Furosemide) .[name].. 1 Qam 3)  Vitamin B-12 1000 Mcg  Tabs (Cyanocobalamin) .[name].. 1 Qd 4)  Proventil Hfa 108 (90 Base) Mcg/act Aers (Albuterol Sulfate) .... 2 Inh Qid As Needed  Allergies: 1)  ! Pcn 2)  ! 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  The patient denies anorexia, fever, weight loss, vision loss, decreased hearing, hoarseness, chest pain, syncope, dyspnea on exertion, peripheral edema, prolonged cough, headaches, hemoptysis, abdominal pain, melena, hematochezia, severe indigestion/heartburn, hematuria, incontinence, genital sores, muscle weakness, suspicious skin lesions, transient blindness, difficulty walking, depression, unusual weight change, abnormal bleeding, enlarged lymph nodes, angioedema, and testicular  masses.    Physical Exam  General:  overweight-appearing.   Head:  Normocephalic and atraumatic without obvious abnormalities. No apparent alopecia or balding. Eyes:  No corneal or conjunctival inflammation noted. EOMI. Perrla. Ears:  External ear exam shows no significant lesions or deformities.  Otoscopic examination reveals clear canals, tympanic membranes are intact bilaterally without bulging, retraction, inflammation or discharge. Hearing is grossly normal bilaterally. Nose:  External nasal examination shows no deformity or inflammation. Nasal mucosa are pink and moist without lesions or exudates. Mouth:  Oral mucosa and oropharynx without lesions or exudates.  Teeth in good repair. Neck:  No deformities, masses, or tenderness noted. Lungs:  Normal respiratory effort, chest expands symmetrically. Lungs are clear to auscultation, no crackles or wheezes. Heart:  Normal rate and regular rhythm. S1 and S2 normal without gallop, murmur, click, rub or other extra sounds. Abdomen:  Bowel sounds positive,abdomen soft and non-tender without masses, organomegaly or hernias noted. Rectal:  Refused Prostate:  Refused Msk:  Lumbar-sacral spine is tender to palpation over paraspinal muscles and painfull with the ROM   Pulses:  R and L carotid,radial,femoral,dorsalis pedis and posterior tibial pulses are full and equal bilaterally Extremities:  trace right pedal edema.   Neurologic:  No cranial nerve deficits noted. Station and gait are normal. Plantar reflexes are down-going bilaterally. DTRs are symmetrical throughout. Sensory, motor and coordinative functions appear intact. Skin:  Intact without suspicious lesions or rashes Cervical Nodes:  No lymphadenopathy noted Psych:  Cognition and judgment appear intact. Alert and cooperative with normal attention span and concentration. No apparent delusions, illusions, hallucinations   Impression & Recommendations:  Problem # 1:  PHYSICAL  EXAMINATION  (ICD-V70.0) Assessment Comment Only  The labs were reviewed with the patient. Declined colonoscopy. Health and age related issues were discussed. Available screening tests and vaccinations were discussed as well. Healthy life style including good diet and execise was discussed. Wt loss discussed  Orders: EKG w/ Interpretation (93000)  Problem # 2:  HYPERLIPIDEMIA (ICD-272.4) Assessment: Deteriorated Refused meds See "Patient Instructions".   Problem # 3:  EDEMA (ICD-782.3) Assessment: Unchanged  His updated medication list for this problem includes:    Furosemide 20 Mg Tabs (Furosemide) .[name]... 1 qam po  Problem # 4:  PULMONARY EMBOLISM, HX OF (ICD-V12.51) Assessment: Unchanged On Coumadin His updated medication list for this problem includes:    Coumadin 6 Mg Tabs (Warfarin sodium) .[name]... Take as directed by coumadin clinic.  Complete Medication List: 1)  Coumadin 6 Mg Tabs (Warfarin sodium) .... Take as directed by coumadin clinic. 2)  Furosemide 20 Mg Tabs (Furosemide) .[name].. 1 qam po 3)  Proventil Hfa 108 (90 Base) Mcg/act Aers (Albuterol sulfate) .... 2 inh qid as needed 4)  Vitamin D3 1000 Unit Tabs (Cholecalciferol) .[name].. 1 by mouth daily 5)  Vitamin B-12 1000 Mcg Tabs (Cyanocobalamin) .[name].. 1 qd  Other Orders: Admin 1st Vaccine (09811) Flu Vaccine 37yrs + (91478)  Patient Instructions: 1)  Try to eat more raw plant food, fresh and dry fruit, raw almonds, leafy vegetables, whole foods and less red meat, less animal fat. Poultry and fish is better for you than pork and beef. Avoid processed foods (canned soups, hot dogs, sausage, bacon , frozen dinners). Avoid corn syrup, high fructose syrup or aspartam and Splenda  containing drinks. Honey, Agave and Stevia are better sweeteners. Make your own  dressing with olive oil, wine vinegar, lemon juce, garlic etc. for your salads. 2)    3)  Please schedule a follow-up appointment in 4 months. 4)  BMP prior to visit, ICD-9: 5)  Lipid  Panel prior to visit, ICD-9: 6)  Vit B12 272.0 995.20 Prescriptions: FUROSEMIDE 20 MG TABS (FUROSEMIDE) 1 qam po  #90 x 3   Entered and Authorized by:   Tresa Garter MD   Signed by:   Tresa Garter MD on 05/15/2009   Method used:   Print then Give to Patient   RxID:   [id number] COUMADIN 6 MG  TABS (WARFARIN SODIUM) Take as directed by coumadin clinic. Brand medically necessary #90 x 3   Entered and Authorized by:   Tresa Garter MD   Signed by:   Tresa Garter MD on 05/15/2009   Method used:   Print then Give to Patient   RxID:   [id number]    Flu Vaccine Consent Questions     Do you have a history of severe allergic reactions to this vaccine? no    Any prior history of allergic reactions to egg and/or gelatin? no    Do you have a sensitivity to the preservative Thimersol? no    Do you have a past history of Guillan-Barre Syndrome? no    Do you currently have an acute febrile illness? no    Have you ever had a severe reaction to latex? no    Vaccine information given and explained to patient? yes    Are you currently pregnant? no    Lot Number:[id number]   Exp Date:12/20/2009   Site Given  Left Deltoid IMbflu

## 2010-07-23 NOTE — Medication Information (Signed)
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 had benign place in lung that is no longer benign  Bleeding/hemorrhagic complications: no    Recent/future hospitalizations: no    Any changes in medication regimen? no    Recent/future dental: no  Any missed doses?: no       Is patient compliant with meds? yes      Comments: will let us know what pulm doc says  Allergies: 1)  ! Pcn 2)  ! Sulfa  Anticoagulation Management History:      The patient is taking warfarin and comes in today for a routine follow up visit.  Negative risk factors for bleeding include an age less than 28 years old.  The bleeding index is 'low risk'.  Negative CHADS2 values include Age > 57 years old.  The start date was 10/31/2002.  His last INR was 2.7 ratio.  Anticoagulation responsible provider: Armoni Kludt.  INR POC: 2.8.  Cuvette Lot#: [id number].  Exp: 03/24/2011.    Anticoagulation Management Assessment/Plan:      The patient's current anticoagulation dose is Coumadin 6 mg  tabs: Take as directed by coumadin clinic..  The target INR is 2 - 3.  The next INR is due 05/27/2010.  Anticoagulation instructions were given to patient.  Results were reviewed/authorized by Lyna Poser, PharmD.  He was notified by Lyna Poser PharmD.         Prior Anticoagulation Instructions: INR 2.2  Continue taking Coumadin 1 tab (6 mg) on all days except Coumadin 0.5 tab (3 mg) on Fridays. Return to clinic in 4 weeks.   Current Anticoagulation Instructions: INR 2.8 Continue taking a half tablet on friday. And 1 tablet all other days. Recheck in 4 weeks.

## 2010-07-23 NOTE — Assessment & Plan Note (Signed)
Summary: 2 mos f/u / # / cd   Vital Signs:  Patient profile:   60 year old male Height:      71 inches Weight:      306 pounds BMI:     42.83 Temp:     98.3 degrees F oral Pulse rate:   76 / minute Pulse rhythm:   regular Resp:     16 per minute BP sitting:   140 / 80  (left arm) Cuff size:   large  Vitals Entered By: Lanier Prude, CMA(AAMA) (April 22, 2010 7:54 AM) CC: 2 mo f/u Comments pt is not taking Vit D, zyrtec, preometha-codein, Lomotil or Align   CC:  2 mo f/u.  History of Present Illness: The patient presents for a follow up of back pain, anxiety, obesity, anticoagulation, CP, abn chest CT.   Current Medications (verified): 1)  Coumadin 6 Mg  Tabs (Warfarin Sodium) .... Take As Directed By Coumadin Clinic. 2)  Furosemide 20 Mg Tabs (Furosemide) .[name].. 1 Qam Po 3)  Proventil Hfa 108 (90 Base) Mcg/act Aers (Albuterol Sulfate) .... 2 Inh Qid As Needed 4)  Vitamin D3 1000 Unit  Tabs (Cholecalciferol) .[name].. 1 By Mouth Daily 5)  Vitamin B-12 1000 Mcg  Tabs (Cyanocobalamin) .[name].. 1 Qd 6)  Zyrtec Allergy 10 Mg Tbdp (Cetirizine Hcl) .[name].. 1 By Mouth Qd 7)  Promethazine-Codeine 6.25-10 Mg/64ml Syrp (Promethazine-Codeine) .... 5-10 Ml By Mouth Q Id As Needed Cough 8)  Lomotil 2.5-0.025 Mg Tabs (Diphenoxylate-Atropine) .[name].. 1-2 By Mouth Qid As Needed Diarrhea 9)  Align  Caps (Probiotic Product) .[name].. 1 By Mouth Once Daily For Your Intesinal Flora Restoraion 10)  Omega-3 Cf 1000 Mg Caps (Omega-3 Fatty Acids) .[name].. 1 Once Daily 11)  Glucosamine 500 Mg Caps (Glucosamine Sulfate) .[name].. 1 Once Daily 12)  Niacin 500 Mg Tabs (Niacin) .[name].. 1 Once Daily 13)  B Complex  Tabs (B Complex Vitamins) .[name].. 1 Once Daily  Allergies (verified): 1)  ! Pcn 2)  ! 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  The patient denies weight loss, weight gain, chest pain, and abdominal pain.    Physical Exam  General:  overweight-appearing.   Ears:  WNL Nose:   Nasal mucosa are pink and moist without lesions or exudates. Mouth:  Erythematous throat mucosa and intranasal erythema.  Lungs:  Normal respiratory effort, chest expands symmetrically. Lungs are clear to auscultation, no crackles or wheezes. Heart:  Normal rate and regular rhythm. S1 and S2 normal without gallop, murmur, click, rub or other extra sounds. Abdomen:  Bowel sounds positive,abdomen soft and non-tender without masses, organomegaly or hernias noted. Msk:  Lumbar-sacral spine is tender to palpation over paraspinal muscles and painfull with the ROM  R hip tender w/flexion and internal rotation  Extremities:  trace right pedal edema.  L nl Neurologic:  No cranial nerve deficits noted. Station and gait are normal. Plantar reflexes are down-going bilaterally. DTRs are symmetrical throughout. Sensory, motor and coordinative functions appear intact. Skin:  R distal forearm w/pigmented growth Psych:  Cognition and judgment appear intact. Alert and cooperative with normal attention span and concentration. No apparent delusions, illusions, hallucinations  Impression & Recommendations:  Problem # 1:  CHEST XRAY, ABNORMAL (ICD-793.1)/CT Assessment New  CT 01/2010: 7 mm nodule in the right lower lobe.  In addition, there is mild right hilar lymphadenopathy with another soft tissue nodule adjacent to the bronchovascular structures in the right lower lobe. These findings are indeterminate but the combination of right hilar lymphadenopathy and a right pulmonary nodule raises concern for a neoplastic process.   These lesions are at size threshold for evaluation with PET-CT.  Consider 63-month chest CT follow-up versus a PET-CT. The pt is undecided of what options to persue. We will get a pulm. consult to sort it out.  Orders: Pulmonary Referral (Pulmonary)  Problem # 2:  CHEST PAIN (ICD-786.50) Assessment: Unchanged  Orders: Pulmonary Referral (Pulmonary)  Problem # 3:  OSTEOARTHRITIS (ICD-715.90) Assessment: Unchanged  Problem # 4:  ANTICOAGULATION THERAPY (ICD-V58.61) Assessment: Unchanged  Orders: Pulmonary Referral (Pulmonary)  Problem # 5:  COUGH (ICD-786.2) Assessment: Unchanged  Complete Medication List: 1)  Coumadin 6 Mg Tabs (Warfarin sodium) .... Take as directed by coumadin clinic. 2)  Furosemide 20 Mg Tabs (Furosemide) .[name].. 1 qam po 3)  Proventil Hfa 108 (90 Base) Mcg/act Aers (Albuterol sulfate) .... 2 inh qid as needed 4)  Vitamin D3 1000 Unit Tabs (Cholecalciferol) .[name].. 1 by mouth daily 5)  Vitamin B-12 1000 Mcg Tabs (Cyanocobalamin) .[name].. 1 qd 6)  Zyrtec Allergy 10 Mg Tbdp (Cetirizine hcl) .[name].. 1 by mouth qd 7)  Promethazine-codeine 6.25-10 Mg/63ml Syrp (Promethazine-codeine) .... 5-10 ml by mouth q id as needed cough 8)  Lomotil 2.5-0.025 Mg Tabs (Diphenoxylate-atropine) .[name].. 1-2 by mouth qid as needed diarrhea 9)  Align Caps (Probiotic product) .[name].. 1 by mouth once daily for your intesinal flora restoraion 10)  Omega-3 Cf 1000 Mg Caps (Omega-3 fatty acids) .[name].. 1 once daily 11)  Glucosamine 500 Mg Caps (Glucosamine sulfate) .[name].. 1 once daily 12)  Niacin 500 Mg Tabs (Niacin) .[name].. 1 once daily 13)  B Complex Tabs (B complex vitamins) .[name].. 1 once daily  Patient Instructions: 1)  Please schedule a follow-up appointment in 3 months. Prescriptions: COUMADIN 6 MG  TABS (WARFARIN SODIUM) Take as directed by coumadin clinic. Brand medically necessary #90 x 3   Entered and Authorized by:   Tresa Garter MD   Signed by:   Tresa Garter MD on 04/22/2010   Method  used:   Print then Give to Patient   RxID:   [id number] FUROSEMIDE 20 MG TABS (FUROSEMIDE) 1 qam po  #90 x 3   Entered and Authorized by:   Tresa Garter MD   Signed by:   Tresa Garter MD on 04/22/2010   Method used:   Print then Give to Patient   RxID:   [id number]    Orders Added: 1)  Pulmonary Referral [Pulmonary] 2)  Est. Patient Level IV [44034]

## 2010-07-23 NOTE — Medication Information (Signed)
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 yes       Details: started on voltaren gel for arthritis in hand  Recent/future dental: no  Any missed doses?: no       Is patient compliant with meds? yes       Allergies: 1)  ! Pcn 2)  ! Sulfa  Anticoagulation Management History:      The patient is taking warfarin and comes in today for a routine follow up visit.  Negative risk factors for bleeding include an age less than 59 years old.  The bleeding index is 'low risk'.  Negative CHADS2 values include Age > 82 years old.  The start date was 10/31/2002.  His last INR was 2.3 RATIO.  Prothrombin time is 21.0.  Anticoagulation responsible provider: Eden Emms MD, Theron Arista.  INR POC: 3.0.  Cuvette Lot#: [id number].  Exp: 12/2009.    Anticoagulation Management Assessment/Plan:      The patient's current anticoagulation dose is Coumadin 6 mg  tabs: as dirr..  The target INR is 2 - 3.  The next INR is due 02/14/2009.  Anticoagulation instructions were given to patient.  Results were reviewed/authorized by Robert Hey, RN.  He was notified by Robert Hey RN.         Prior Anticoagulation Instructions: INR 3.0 HOLD COUMADIN TONIGHT THEN RESUME CURRENT DOSE OF 6MG  A DAY INCREASE GREENS  Current Anticoagulation Instructions: INR 3.0 CONTINUE 6MG  DAY

## 2010-07-23 NOTE — Medication Information (Signed)
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 yes       Details: took erythromycin last week after root canal  Recent/future dental: yes     Details: had root canal last week  Any missed doses?: no       Is patient compliant with meds? yes       Allergies: 1)  ! Pcn 2)  ! Sulfa  Anticoagulation Management History:      The patient is taking warfarin and comes in today for a routine follow up visit.  Negative risk factors for bleeding include an age less than 53 years old.  The bleeding index is 'low risk'.  Negative CHADS2 values include Age > 49 years old.  The start date was 10/31/2002.  His last INR was 2.3 RATIO.  Anticoagulation responsible provider: Barrett Holthaus MD, Reuel Boom.  INR POC: 3.3.  Cuvette Lot#: [id number].  Exp: 01/2011.    Anticoagulation Management Assessment/Plan:      The patient's current anticoagulation dose is Coumadin 6 mg  tabs: Take as directed by coumadin clinic..  The target INR is 2 - 3.  The next INR is due 12/06/2009.  Anticoagulation instructions were given to patient.  Results were reviewed/authorized by Weston Brass, PharmD.  He was notified by Weston Brass PharmD.         Prior Anticoagulation Instructions: INR 3.0  Continue same dose of 1 tablet every day except 1/2 tablet on Friday   Current Anticoagulation Instructions: INR 3.3  Take 1/2 tablet today then resume same dose of 1 tablet every day except 1/2 tablet on Friday

## 2010-07-23 NOTE — Medication Information (Signed)
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 no    Recent/future dental: no  Any missed doses?: no       Is patient compliant with meds? yes       Allergies (verified): 1)  ! Pcn 2)  ! Sulfa  Anticoagulation Management History:      Negative risk factors for bleeding include an age less than 84 years old.  The bleeding index is 'low risk'.  Negative CHADS2 values include Age > 42 years old.  The start date was 10/31/2002.  His last INR was 2.3 RATIO.  Prothrombin time is 22.9.  Anticoagulation responsible provider: Bartolo Montanye MD, Fayrene Fearing.  INR POC: 3.6.  Cuvette Lot#: [id number].  Exp: 12/2009.    Anticoagulation Management Assessment/Plan:      The patient's current anticoagulation dose is Coumadin 6 mg  tabs: as dirr..  The next INR is due 01/17/2009.  Anticoagulation instructions were given to patient.  Results were reviewed/authorized by Vashti Hey, RN.  He was notified by Vashti Hey RN.         Prior Anticoagulation Instructions: INR 2.2 Continue 6mg s daily   Current Anticoagulation Instructions: INR 3.0 HOLD COUMADIN TONIGHT THEN RESUME CURRENT DOSE OF 6MG  A DAY INCREASE GREENS

## 2010-07-23 NOTE — Assessment & Plan Note (Signed)
Summary: ? Pneumonia/scm   Vital Signs:  Patient Profile:   60 Years Old Male Weight:      288.4 pounds Temp:     97.8 degrees F oral Pulse rate:   80 / minute BP sitting:   120 / 80  (left arm) Cuff size:   large  Vitals Entered By: Payton Spark CMA (March 10, 2008 4:41 PM)                 Chief Complaint:  ?pneumonia x 3 days.  History of Present Illness: here with acute onset prod cough, fever , myalgias and malaise for 2 days without worsening cp, sob/doe or wheezing    Updated Prior Medication List: COUMADIN 6 MG  TABS (WARFARIN SODIUM) as dirr. FUROSEMIDE 20 MG TABS (FUROSEMIDE) 1 qam VITAMIN B-12 1000 MCG  TABS (CYANOCOBALAMIN) 1 qd VITAMIN D3 1000 UNIT  TABS (CHOLECALCIFEROL) 1 qd TRIAMCINOLONE ACETONIDE 0.5 % CREA (TRIAMCINOLONE ACETONIDE) bid TUSSICAPS 10-8 MG  CP12 (HYDROCOD POLST-CHLORPHEN POLST) 1 by mouth two times a day as needed cough ALBUTEROL SULFATE 4 MG  TABS (ALBUTEROL SULFATE) 1 by mouth two times a day as needed wheezing LEVAQUIN 500 MG TABS (LEVOFLOXACIN) 1 by mouth once daily  Current Allergies (reviewed today): ! PCN ! SULFA  Past Medical History:    Reviewed history from 08/26/2007 and no changes required:       Anticoagulation therapy       Low back pain       Pulmonary embolism, hx of       Asthma   Social History:    Reviewed history from 04/23/2007 and no changes required:       Occupation: disabled       Married       Never Smoked       Alcohol use-no    Review of Systems       all otherwise negative    Physical Exam  General:     alert and overweight-appearing.  mild ill Head:     Normocephalic and atraumatic without obvious abnormalities. No apparent alopecia or balding. Eyes:     No corneal or conjunctival inflammation noted. EOMI. Perrla. Ears:     bilat tm's red, sinus nontender Nose:     nasal dischargemucosal pallor and mucosal erythema.   Mouth:     pharyngeal erythema and fair dentition.   Neck:      No deformities, masses, or tenderness noted. Lungs:     Normal respiratory effort, chest expands symmetrically. Lungs are clear to auscultation, no crackles or wheezes. Heart:     Normal rate and regular rhythm. S1 and S2 normal without gallop, murmur, click, rub or other extra sounds. Extremities:     no edema, no ulcers     Impression & Recommendations:  Problem # 1:  BRONCHITIS-ACUTE (ICD-466.0)  His updated medication list for this problem includes:    Tussicaps 10-8 Mg Cp12 (Hydrocod polst-chlorphen polst) .[name]... 1 by mouth two times a day as needed cough    Albuterol Sulfate 4 Mg Tabs (Albuterol sulfate) .[name]... 1 by mouth two times a day as needed wheezing    Levaquin 500 Mg Tabs (Levofloxacin) .[name]... 1 by mouth once daily treat as above, f/u any worsening signs or symptoms , decline cxr  Problem # 2:  ASTHMA (ICD-493.90)  The following medications were removed from the medication list:    Prednisone 10 Mg Tabs (Prednisone) .[name]... Take 40mg  qd for 3 days, then 20 mg  qd for 3 days, then 10mg  qd for 6 days, then stop. take pc.  His updated medication list for this problem includes:    Albuterol Sulfate 4 Mg Tabs (Albuterol sulfate) .[name]... 1 by mouth two times a day as needed wheezing o/w stable overall by hx and exam, ok to continue meds/tx as is   Complete Medication List: 1)  Coumadin 6 Mg Tabs (Warfarin sodium) .... As dirr. 2)  Furosemide 20 Mg Tabs (Furosemide) .[name].. 1 qam 3)  Vitamin B-12 1000 Mcg Tabs (Cyanocobalamin) .[name].. 1 qd 4)  Vitamin D3 1000 Unit Tabs (Cholecalciferol) .[name].. 1 qd 5)  Triamcinolone Acetonide 0.5 % Crea (Triamcinolone acetonide) .... Bid 6)  Tussicaps 10-8 Mg Cp12 (Hydrocod polst-chlorphen polst) .[name].. 1 by mouth two times a day as needed cough 7)  Albuterol Sulfate 4 Mg Tabs (Albuterol sulfate) .[name].. 1 by mouth two times a day as needed wheezing 8)  Levaquin 500 Mg Tabs (Levofloxacin) .[name].. 1 by mouth once daily   Patient Instructions: 1)  Please  take all new medications as prescribed 2)  Continue all medications that you may have been taking previously 3)  Please schedule an appointment with your primary doctor as needed   Prescriptions: TUSSICAPS 10-8 MG  CP12 (HYDROCOD POLST-CHLORPHEN POLST) 1 by mouth two times a day as needed cough  #30 x 0   Entered and Authorized by:   Corwin Levins MD   Signed by:   Corwin Levins MD on 03/10/2008   Method used:   Print then Give to Patient   RxID:   [id number] LEVAQUIN 500 MG TABS (LEVOFLOXACIN) 1 by mouth once daily  #10 x 0   Entered and Authorized by:   Corwin Levins MD   Signed by:   Corwin Levins MD on 03/10/2008   Method used:   Print then Give to Patient   RxID:   [id number]  ]

## 2010-07-23 NOTE — Letter (Signed)
Summary: [facility]  [facility]   Imported By: Lester Surry 02/28/2010 10:11:53  _____________________________________________________________________  External Attachment:    Type:   Image     Comment:   External Document

## 2010-07-23 NOTE — Medication Information (Signed)
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  Lymph gland still swollen, dentist aware, and no apparent infection.  Bleeding/hemorrhagic complications: no    Recent/future hospitalizations: no    Any changes in medication regimen? yes       Details: Erythromycin for root canal and ear infection for 4 weeks. Ciprodex for ear infection.  Recent/future dental: yes     Details: Root canal complete.  Any missed doses?: no       Is patient compliant with meds? yes       Allergies: 1)  ! Pcn 2)  ! Sulfa  Anticoagulation Management History:      The patient is taking warfarin and comes in today for a routine follow up visit.  Negative risk factors for bleeding include an age less than 57 years old.  The bleeding index is 'low risk'.  Negative CHADS2 values include Age > 38 years old.  The start date was 10/31/2002.  His last INR was 2.3 RATIO.  Anticoagulation responsible provider: Eden Emms MD, Theron Arista.  INR POC: 3.7.  Cuvette Lot#: [id number].  Exp: 01/2011.    Anticoagulation Management Assessment/Plan:      The patient's current anticoagulation dose is Coumadin 6 mg  tabs: Take as directed by coumadin clinic..  The target INR is 2 - 3.  The next INR is due 12/20/2009.  Anticoagulation instructions were given to patient.  Results were reviewed/authorized by Elaina Pattee, PharmD.  He was notified by Elaina Pattee, PharmD.         Prior Anticoagulation Instructions: INR 3.3  Take 1/2 tablet today then resume same dose of 1 tablet every day except 1/2 tablet on Friday  Current Anticoagulation Instructions: INR 3.7. Hold Coumadin  today, then take 1 tablet daily except 0.5 tablet on Fridays. Recheck in 2 weeks.

## 2010-07-23 NOTE — Assessment & Plan Note (Signed)
Summary: 4 MOS F/U #/CD   Vital Signs:  Patient profile:   60 year old male Height:      71 inches Weight:      308 pounds BMI:     43.11 Temp:     97.7 degrees F oral Pulse rhythm:   regular Resp:     16 per minute BP sitting:   130 / 76  (left arm) Cuff size:   large  Vitals Entered By: Lanier Prude, Beverly Gust) (February 06, 2010 3:52 PM) CC: f/u Comments per pt's wife he had episode of chest pain/nausea last night that subsided but did not seek tx for symptoms.  He is not taking Vit D3, Zyrtec, Prometha-Codeine or Align.  PLease remove from list   CC:  f/u.  History of Present Illness: C/o R forearm brown  spot C/o an episode of  bad CP last night on L side  w/o irradiation x hrs. No CP this am or now. C/o R leg pain which is chronic. No SOB. No syncope or sweats.  Current Medications (verified): 1)  Coumadin 6 Mg  Tabs (Warfarin Sodium) .... Take As Directed By Coumadin Clinic. 2)  Furosemide 20 Mg Tabs (Furosemide) .[name].. 1 Qam Po 3)  Proventil Hfa 108 (90 Base) Mcg/act Aers (Albuterol Sulfate) .... 2 Inh Qid As Needed 4)  Vitamin D3 1000 Unit  Tabs (Cholecalciferol) .[name].. 1 By Mouth Daily 5)  Vitamin B-12 1000 Mcg  Tabs (Cyanocobalamin) .[name].. 1 Qd 6)  Zyrtec Allergy 10 Mg Tbdp (Cetirizine Hcl) .[name].. 1 By Mouth Qd 7)  Promethazine-Codeine 6.25-10 Mg/107ml Syrp (Promethazine-Codeine) .... 5-10 Ml By Mouth Q Id As Needed Cough 8)  Lomotil 2.5-0.025 Mg Tabs (Diphenoxylate-Atropine) .[name].. 1-2 By Mouth Qid As Needed Diarrhea 9)  Align  Caps (Probiotic Product) .[name].. 1 By Mouth Once Daily For Your Intesinal Flora Restoraion 10)  Omega-3 Cf 1000 Mg Caps (Omega-3 Fatty Acids) .[name].. 1 Once Daily 11)  Glucosamine 500 Mg Caps (Glucosamine Sulfate) .[name].. 1 Once Daily 12)  Niacin 500 Mg Tabs (Niacin) .[name].. 1 Once Daily 13)  B Complex  Tabs (B Complex Vitamins) .[name].. 1 Once Daily  Allergies (verified): 1)  ! Pcn 2)  ! 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  The patient denies fever, hoarseness, syncope, prolonged cough, headaches, abdominal pain, melena, hematochezia, and severe indigestion/heartburn.    Physical Exam  General:  overweight-appearing.   Head:  Normocephalic and atraumatic without obvious abnormalities. No apparent alopecia or balding. Ears:  WNL Nose:   Nasal mucosa are pink and moist without lesions or exudates. Mouth:  Erythematous throat mucosa and intranasal erythema.  Neck:  No deformities, masses, or tenderness noted. Chest Wall:  NT Lungs:  Normal respiratory effort, chest expands symmetrically. Lungs are clear to auscultation, no crackles or wheezes. Heart:  Normal rate and regular rhythm. S1 and S2 normal without gallop, murmur, click, rub or other extra  sounds. Abdomen:  Bowel sounds positive,abdomen soft and non-tender without masses, organomegaly or hernias noted. Msk:  Lumbar-sacral spine is tender to palpation over paraspinal muscles and painfull with the ROM  R hip tender w/flexion and internal rotation  Pulses:  R and L carotid,radial,femoral,dorsalis pedis and posterior tibial pulses are full and equal bilaterally Extremities:  trace right pedal edema.  L nl Neurologic:  No cranial nerve deficits noted. Station and gait are normal. Plantar  reflexes are down-going bilaterally. DTRs are symmetrical throughout. Sensory, motor and coordinative functions appear intact. Skin:  R distal forearm w/pigmented growth Cervical Nodes:  No lymphadenopathy noted Inguinal Nodes:  No significant adenopathy Psych:  Cognition and judgment appear intact. Alert and cooperative with normal attention span and concentration. No apparent delusions, illusions, hallucinations   Impression & Recommendations:  Problem # 1:  NEOPLASM OF UNCERTAIN BEHAVIOR OF SKIN (ICD-238.2) R forearm Assessment New Skin biopsy later  Problem # 2:  CHEST PAIN (ICD-786.50) unclear etiology Assessment: New He did not go to ER last night because he does not believe that they can see him fast and treat him appropriately. He is refusing ER/Hosp admission now. He understands tha he potentially could be having PE, MI or other serious/life threatening condition.  See "Patient Instructions".  Orders: T-Troponin I [id number]) D-Dimer- [facility] [phone]) CK Total [id number]) CK Isoenzymes ([id number]) TLB-BMP (Basic Metabolic Panel-BMET) (80048-METABOL) TLB-CBC Platelet - w/Differential (85025-CBCD) Radiology Referral (Radiology) - chest CT TLB-PT (Protime) (85610-PTP)  Problem # 3:  DYSPNEA (ICD-786.05) Assessment: Unchanged as per #2  Problem # 4:  ANTICOAGULATION THERAPY (ICD-V58.61) Assessment: Comment Only Will get INR  Problem # 5:  PULMONARY EMBOLISM, HX OF (ICD-V12.51) Assessment: Comment Only  His updated medication list for this problem includes:    Coumadin 6 Mg Tabs (Warfarin sodium) .[name]... Take as directed by coumadin clinic.  Orders: Radiology Referral (Radiology)  Complete Medication List: 1)  Coumadin 6 Mg Tabs (Warfarin sodium) .... Take as directed by coumadin clinic. 2)  Furosemide 20 Mg Tabs (Furosemide) .[name].. 1 qam po 3)  Proventil Hfa 108 (90 Base) Mcg/act Aers (Albuterol sulfate) .... 2 inh qid as needed 4)  Vitamin D3 1000 Unit Tabs  (Cholecalciferol) .[name].. 1 by mouth daily 5)  Vitamin B-12 1000 Mcg Tabs (Cyanocobalamin) .[name].. 1 qd 6)  Zyrtec Allergy 10 Mg Tbdp (Cetirizine hcl) .[name].. 1 by mouth qd 7)  Promethazine-codeine 6.25-10 Mg/80ml Syrp (Promethazine-codeine) .... 5-10 ml by mouth q id as needed cough 8)  Lomotil 2.5-0.025 Mg Tabs (Diphenoxylate-atropine) .[name].. 1-2 by mouth qid as needed diarrhea 9)  Align Caps (Probiotic product) .[name].. 1 by mouth once daily for your intesinal flora restoraion 10)  Omega-3 Cf 1000 Mg Caps (Omega-3 fatty acids) .[name].. 1 once daily 11)  Glucosamine 500 Mg Caps (Glucosamine sulfate) .[name].. 1 once daily 12)  Niacin 500 Mg Tabs (Niacin) .[name].. 1 once daily 13)  B Complex Tabs (B complex vitamins) .[name].. 1 once daily  Patient Instructions: 1)  Please schedule a follow-up appointment in 1week skin bx. 2)   Go to ER if feeling really bad!  3)  Call 911 if sick!

## 2010-07-23 NOTE — Assessment & Plan Note (Signed)
Summary: ABNORMAL CXR ///KP   Visit Type:  Initial Consult Copy to:  pcp Primary Provider/Referring Provider:  Tresa Garter MD  CC:  Pulmonary consult. Abnormal CT .  History of Present Illness: 59/M, never smoker referred for evaluation of abnormal imaging. He underwent CT angio for increase dyspnea & chest pain on 8/19 /11 which was neg for PE. Showed 7mm nodule in the RLL which was stable from 2003. 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 No other mediastinal LN was noted. He is concerned thatt hese findings may be related to his exposure to asbestos. 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 He has been maintianed on coumadin since , plan lifelong per his d/w Dr Sung Amabile then. He woeked on a wall covered with asbestos for  a week when with the railroad. His intermittent dyspnea has been labelled as 'asthma' He denies hemoptysis, cough , fevers, chills, weight loss.  Preventive Screening-Counseling & Management  Alcohol-Tobacco     Smoking Status: never  Current Medications (verified): 1)  Coumadin 6 Mg  Tabs (Warfarin Sodium) .... Take As Directed By Coumadin Clinic. 2)  Furosemide 20 Mg Tabs (Furosemide) .[name].. 1 Qam Po 3)  Proventil Hfa 108 (90 Base) Mcg/act Aers (Albuterol Sulfate) .... 2 Inh Qid As Needed 4)  Vitamin D3 1000 Unit  Tabs (Cholecalciferol) .[name].. 1 By Mouth Daily 5)  Zyrtec Allergy 10 Mg Tbdp (Cetirizine Hcl) .[name].. 1 By Mouth Once Daily Seasonal 6)  Promethazine-Codeine 6.25-10 Mg/57ml Syrp (Promethazine-Codeine) .... 5-10 Ml By Mouth Q Id As Needed Cough 7)  Lomotil 2.5-0.025 Mg Tabs (Diphenoxylate-Atropine) .[name].. 1-2 By Mouth Qid As Needed Diarrhea 8)  Omega-3 Cf 1000 Mg Caps  (Omega-3 Fatty Acids) .[name].. 1 Once Daily 9)  Glucosamine 500 Mg Caps (Glucosamine Sulfate) .[name].. 1 Once Daily 10)  Niacin 500 Mg Tabs (Niacin) .[name].. 1 Once Daily 11)  B Complex  Tabs (B Complex Vitamins) .[name].. 1 Once Daily  Allergies (verified): 1)  ! Pcn 2)  ! 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 Hyperlipidemia Osteoarthritis Obesity  Past Surgical History: Last updated: 05/15/2009 Lumbar fusion Dr Jillyn Hidden Appendectomy Dr Jamey Ripa Cholecystectomy Colon 1995 Dr Randa Evens  Family History: Last updated: 04/23/2007 Family History High cholesterol Family History Hypertension Parkinson's  Social History: Last updated: 04/23/2007 Occupation: disabled Married Never Smoked Alcohol use-no  Review of Systems       The patient complains of shortness of breath with activity, shortness of breath at rest, non-productive cough, chest pain, acid heartburn, indigestion, weight change, abdominal pain, difficulty swallowing, sore throat, tooth/dental problems, headaches, hand/feet swelling, and joint stiffness or pain.  The patient denies productive cough, coughing up blood, irregular heartbeats, loss of appetite, nasal congestion/difficulty breathing through nose, sneezing, itching, ear ache, anxiety, depression, rash, change in color of mucus, and fever.    Vital Signs:  Patient profile:   60 year old male Height:      71 inches Weight:      309 pounds BMI:     43.25 O2 Sat:  97 % on Room air Temp:     98.1 degrees F oral Pulse rate:   73 / minute BP sitting:   140 / 88  (right arm) Cuff size:   large  Vitals Entered By: Zackery Barefoot CMA (May 15, 2010 9:58 AM)  O2 Flow:  Room air CC: Pulmonary consult. Abnormal CT  Comments Medications reviewed with patient Verified contact number and pharmacy with patient Zackery Barefoot  CMA  May 15, 2010 9:59 AM    Physical Exam  Additional Exam:  Gen. Pleasant, obese, in no distress, normal affect ENT - no lesions, no post nasal drip, class 3 airway Neck: No JVD, no thyromegaly, no carotid bruits Lungs: no use of accessory muscles, no dullness to percussion, clear without rales or rhonchi  Cardiovascular: Rhythm regular, heart sounds  normal, no murmurs or gallops, no peripheral edema Abdomen: soft and non-tender, no hepatosplenomegaly, BS normal. Musculoskeletal: No deformities, no cyanosis or clubbing, hose in RLE Neuro:  alert, non focal     Impression & Recommendations:  Problem # 1:  LYMPHADENOPATHY (ICD-785.6) Unclear significance in this never smoker - he does have stigmata of old granulomatous disease. 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 Take as directed by coumadin clinic.  Orders: Consultation Level IV (60454)  Medications Added to Medication List This Visit: 1)  Zyrtec Allergy 10 Mg Tbdp (Cetirizine hcl) .[name].. 1 by mouth once daily seasonal  Other Orders: Doppler Referral (Doppler)  Patient Instructions: 1)  Copy sent to: dr plotnikov 2)  Please schedule a follow-up appointment in 2 weeks. 3)  A whole body PET Scan has been recommended.  Your imaging study may require preauthorization.  4)  Doppler test for legs    Appended Document: Orders  Update Doppler shows - chronic clot in rt thigh vein negative PET scan  - OK to proceedw ith dental surgery, FU CT chest & appt in 6 mnths pl fax to oral surgeon   Clinical Lists Changes  Orders: Added new Referral order of Radiology Referral (Radiology) - Signed      Appended Document: ABNORMAL CXR ///KP Pt aware, notes faxed to [phone]

## 2010-07-23 NOTE — Medication Information (Signed)
Summary: rov-tp  Anticoagulant Therapy  Managed by: Lyna Poser PharmD Referring MD: Sonda Primes Supervising MD: Myrtis Ser MD,Waller Marcussen Indication 1: Deep Vein Thrombosis - Leg (ICD-451.1) Indication 2: Pulmonary embolus (ICD-415.19) Lab Used: [facility] Site: [facility] INR POC 2.7 INR RANGE 2 - 3  Dietary changes: no    Health status changes: no    Bleeding/hemorrhagic complications: no    Recent/future hospitalizations: no    Any changes in medication regimen? yes       Details: switched from percocet to vicodin for pain  Recent/future dental: no  Any missed doses?: no       Is patient compliant with meds? yes       Allergies: 1)  ! Pcn 2)  ! Sulfa  Anticoagulation Management History:      The patient is taking warfarin and comes in today for a routine follow up visit.  Negative risk factors for bleeding include an age less than 59 years old.  The bleeding index is 'low risk'.  Negative CHADS2 values include Age > 59 years old.  The start date was 10/31/2002.  His last INR was 2.7 ratio.  Anticoagulation responsible provider: Myrtis Ser MD,Talyah Seder.  INR POC: 2.7.  Cuvette Lot#: [id number].  Exp: 03/24/2011.    Anticoagulation Management Assessment/Plan:      The patient's current anticoagulation dose is Coumadin 6 mg  tabs: Take as directed by coumadin clinic..  The target INR is 2 - 3.  The next INR is due 04/03/2010.  Anticoagulation instructions were given to patient.  Results were reviewed/authorized by Lyna Poser PharmD.         Prior Anticoagulation Instructions: INR therapeutic. Continue 6mg  daily except 3mg  on Fridays.  Current Anticoagulation Instructions: INR 2.7 Continue same dosages of 1 tablet daily except a 1/2 tablet on friday. See you in 4 weeks.

## 2010-07-23 NOTE — Progress Notes (Signed)
Summary: ? Pneumonia  Phone Note Call from Patient   Caller: [name] Summary of Call: Robert [name] calling to see if Fax recieved.  [name] thinks patient has pneumonia and would like RX prescriber if possible otherwise patient is willing to be seen. Patient started out with sinus infection-now deep wet cough. Anytime patient gets sick he gets real sick. Initial call taken by: Shonna Chock,  March 10, 2008 11:41 AM  Follow-up for Phone Call        Scheduled appointment for patient to see Dr.John today. Follow-up by: Shonna Chock,  March 10, 2008 11:50 AM

## 2010-07-23 NOTE — Medication Information (Signed)
Summary: rov coumadin - [facility]  Anticoagulant Therapy  Managed by: Eda Keys, PharmD Referring MD: Sonda Primes Supervising MD: Eden Emms MD, Theron Arista Indication 1: Deep Vein Thrombosis - Leg (ICD-451.1) Indication 2: Pulmonary embolus (ICD-415.19) Lab Used: [facility] [place] Site: [facility] INR POC 2.6 INR RANGE 2 - 3  Dietary changes: no    Health status changes: no    Bleeding/hemorrhagic complications: no    Recent/future hospitalizations: no    Any changes in medication regimen? no    Recent/future dental: no  Any missed doses?: no       Is patient compliant with meds? yes       Allergies: 1)  ! Pcn 2)  ! Sulfa  Anticoagulation Management History:      The patient is taking warfarin and comes in today for a routine follow up visit.  Negative risk factors for bleeding include an age less than 60 years old.  The bleeding index is 'low risk'.  Negative CHADS2 values include Age > 41 years old.  The start date was 10/31/2002.  His last INR was 2.3 RATIO.  Anticoagulation responsible provider: Eden Emms MD, Theron Arista.  INR POC: 2.6.  Cuvette Lot#: [id number].  Exp: 09/2010.    Anticoagulation Management Assessment/Plan:      The patient's current anticoagulation dose is Coumadin 6 mg  tabs: Take as directed by coumadin clinic..  The target INR is 2 - 3.  The next INR is due 09/06/2009.  Anticoagulation instructions were given to patient.  Results were reviewed/authorized by Eda Keys, PharmD.  He was notified by Eda Keys.         Prior Anticoagulation Instructions: INR 2.8  Continue on same dose of 1 tablet daily except 0.5 tablet on Fridays. Recheck in 4 weeks.  Current Anticoagulation Instructions: INR 2.6  Continue current dosing schedule.  Take 3 mg on Friday and take 6 mg all other days.  Return to clinic in 4 weeks.

## 2010-07-23 NOTE — Assessment & Plan Note (Signed)
Summary: spider bite SD   Vital Signs:  Patient profile:   60 year old male Height:      71 inches Weight:      302 pounds BMI:     42.27 Temp:     97.8 degrees F oral Pulse rate:   84 / minute BP sitting:   132 / 74  (left arm)  Vitals Entered By: Tora Perches (Oct 27, 2008 4:08 PM)  Procedure Note  Incision & Drainage: The patient complains of pain, redness, tenderness, swelling, and discharge. Indication: infected lesion  Procedure # 1: I & D    Size (in cm): 2.1 x 1.4    Region: dorsal    Location: R 3d finger    Comment: Risks including but not limited by incomplete procedure, bleeding, infection, recurrence were discussed with the patient. Consent form was signed.     Instrument used: #10 blade    Anesthesia: none  Cleaned and prepped with: alcohol and betadine Wound dressing: neosporin and bandaid Additional Instructions: Small amount of pus, bleeding. Tol well, compl, none  CC: INFECT FINGER Is Patient Diabetic? No   CC:  INFECT FINGER.  History of Present Illness: L 3d finger burning pain and swelling spreading on dorsal pam x 2-3 d. Squeezed out pus this am  Current Medications (verified): 1)  Coumadin 6 Mg  Tabs (Warfarin Sodium) .... As Dirr. 2)  Furosemide 20 Mg Tabs (Furosemide) .[name].. 1 Qam 3)  Vitamin B-12 1000 Mcg  Tabs (Cyanocobalamin) .[name].. 1 Qd 4)  Vitamin D3 1000 Unit  Tabs (Cholecalciferol) .[name].. 1 Qd 5)  Triamcinolone Acetonide 0.5 % Crea (Triamcinolone Acetonide) .... Bid 6)  Proventil Hfa 108 (90 Base) Mcg/act Aers (Albuterol Sulfate) .... 2 Inh Qid As Needed  Allergies: 1)  ! Pcn 2)  ! 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   Ears:  L TM red Lungs:  Normal respiratory effort, chest expands symmetrically. Lungs are clear to  auscultation, no crackles or wheezes. Heart:  Normal rate and regular rhythm. S1 and S2 normal without gallop, murmur, click, rub or other extra sounds. Skin:  L 3d dorsal prox finger w/erythematous 2 cm abscess with prox streaks of red   Impression & Recommendations:  Problem # 1:  CELLULITIS, HAND, LEFT (ICD-682.4) Assessment New  The following medications were removed from the medication list:    Doxycycline Hyclate 100 Mg Caps (Doxycycline hyclate) .[name]... Take 1 tab twice a day His updated medication list for this problem includes:    Levaquin 500 Mg Tabs (Levofloxacin) .[name]... Take 1 tab by mouth daily  Orders: I&D Abscess, Simple / Single (10060)  Problem # 2:  OTITIS MEDIA (ICD-382.9) L Assessment: New  The following medications were removed from the medication list:    Doxycycline Hyclate 100 Mg Caps (Doxycycline hyclate) .[name]... Take 1 tab twice a day His updated medication list for this problem includes:    Levaquin 500 Mg Tabs (Levofloxacin) .[name]... Take 1 tab by mouth daily  Complete Medication List: 1)  Coumadin 6 Mg Tabs (Warfarin sodium) .... As dirr. 2)  Furosemide 20 Mg Tabs (Furosemide) .[name].. 1 qam 3)  Vitamin B-12 1000 Mcg Tabs (Cyanocobalamin) .[name].. 1 qd 4)  Vitamin D3 1000 Unit Tabs (Cholecalciferol) .[name].. 1 qd 5)  Triamcinolone Acetonide 0.5 % Crea (Triamcinolone acetonide) .... Bid 6)  Proventil Hfa 108 (90 Base) Mcg/act Aers (Albuterol sulfate) .... 2 inh qid as needed 7)  Levaquin 500 Mg Tabs (Levofloxacin) .... Take 1 tab by mouth daily  Patient Instructions: 1)  Call if you are not better in a reasonable ammount of time or if worse. Go to ER if it gets really bad! Prescriptions: LEVAQUIN 500 MG TABS (LEVOFLOXACIN) Take 1 tab by mouth daily  #10 x 0   Entered and Authorized by:   Tresa Garter MD   Signed by:   Tresa Garter MD on 10/27/2008   Method used:   Electronically to        [facility]* (retail)       [address] [place], [place]  [address]       Ph: [phone] or [phone]       Fax: [phone]   RxID:   [id number]

## 2010-07-23 NOTE — Medication Information (Signed)
Summary: rov/ewj  Anticoagulant Therapy  Managed by: Lew Dawes, PharmD Candidate Referring MD: Sonda Primes Supervising MD: Eden Emms MD, Theron Arista Indication 1: Deep Vein Thrombosis - Leg (ICD-451.1) Indication 2: Pulmonary embolus (ICD-415.19) Lab Used: [facility] Site: [facility] INR POC 2.8 INR RANGE 2 - 3  Dietary changes: no    Health status changes: no    Bleeding/hemorrhagic complications: no    Recent/future hospitalizations: no    Any changes in medication regimen? no    Recent/future dental: no  Any missed doses?: no       Is patient compliant with meds? yes       Current Medications (verified): 1)  Coumadin 6 Mg  Tabs (Warfarin Sodium) .... Take As Directed By Coumadin Clinic. 2)  Furosemide 20 Mg Tabs (Furosemide) .[name].. 1 Qam Po 3)  Proventil Hfa 108 (90 Base) Mcg/act Aers (Albuterol Sulfate) .... 2 Inh Qid As Needed 4)  Vitamin D3 1000 Unit  Tabs (Cholecalciferol) .[name].. 1 By Mouth Daily 5)  Vitamin B-12 1000 Mcg  Tabs (Cyanocobalamin) .[name].. 1 Qd  Allergies (verified): 1)  ! Pcn 2)  ! Sulfa  Anticoagulation Management History:      The patient is taking warfarin and comes in today for a routine follow up visit.  Negative risk factors for bleeding include an age less than 66 years old.  The bleeding index is 'low risk'.  Negative CHADS2 values include Age > 61 years old.  The start date was 10/31/2002.  His last INR was 2.3 RATIO.  Anticoagulation responsible provider: Eden Emms MD, Theron Arista.  INR POC: 2.8.  Cuvette Lot#: [id number].  Exp: 09/2010.    Anticoagulation Management Assessment/Plan:      The patient's current anticoagulation dose is Coumadin 6 mg  tabs: Take as directed by coumadin clinic..  The target INR is 2 - 3.  The next INR is due 08/09/2009.  Anticoagulation instructions were given to patient.  Results were reviewed/authorized by Lew Dawes, PharmD Candidate.  He was notified by Lew Dawes, PharmD Candidate.         Prior Anticoagulation  Instructions: INR 2.9  Continue on same dosage 1 tablet daily except 1/2 tablet on Fridays.  Recheck in 4 weeks.    Current Anticoagulation Instructions: INR 2.8  Continue on same dose of 1 tablet daily except 0.5 tablet on Fridays. Recheck in 4 weeks.

## 2010-07-23 NOTE — Assessment & Plan Note (Signed)
Summary: REFER TO 3/4 PN/APPT @ 11:45/OK TO ADD PER MD/NML   Vital Signs:  Patient Profile:   60 Years Old Male Weight:      290 pounds Temp:     99.5 degrees F oral Pulse rate:   78 / minute BP sitting:   131 / 77  (left arm)  Vitals Entered By: Tora Perches (August 26, 2007 12:03 PM)                 Chief Complaint:  Multiple medical problems or concerns.  History of Present Illness: C/o bad cough and SOB with a minimal exertion, mucus and wheezing. No fever. Last day of ABX today (12 d).    Current Allergies: ! PCN ! SULFA  Past Medical History:    Reviewed history from 04/23/2007 and no changes required:       Anticoagulation therapy       Low back pain       Pulmonary embolism, hx of       Asthma   Family History:    Reviewed history from 04/23/2007 and no changes required:       Family History High cholesterol       Family History Hypertension       Parkinson's  Social History:    Reviewed history from 04/23/2007 and no changes required:       Occupation: disabled       Married       Never Smoked       Alcohol use-no     Review of Systems  The patient denies dyspnea on exhertion and prolonged cough.         Wheezes   Physical Exam  General:     overweight-appearing.   Head:     Normocephalic and atraumatic without obvious abnormalities. No apparent alopecia or balding. Eyes:     No corneal or conjunctival inflammation noted. EOMI. Perrla. Funduscopic exam benign, without hemorrhages, exudates or papilledema. Vision grossly normal. Ears:     External ear exam shows no significant lesions or deformities.  Otoscopic examination reveals clear canals, tympanic membranes are intact bilaterally without bulging, retraction, inflammation or discharge. Hearing is grossly normal bilaterally. Nose:     External nasal examination shows no deformity or inflammation. Nasal mucosa are pink and moist without lesions or exudates. Mouth:     Oral mucosa and  oropharynx without lesions or exudates.  Teeth in good repair. Neck:     No deformities, masses, or tenderness noted. Lungs:     Normal respiratory effort, chest expands symmetrically. Lungs are clear to auscultation, no crackles or wheezes. Heart:     Normal rate and regular rhythm. S1 and S2 normal without gallop, murmur, click, rub or other extra sounds. Abdomen:     Bowel sounds positive,abdomen soft and non-tender without masses, organomegaly or hernias noted. Msk:     No deformity or scoliosis noted of thoracic or lumbar spine.   Extremities:     trace right pedal edema.   Neurologic:     No cranial nerve deficits noted. Station and gait are normal. Plantar reflexes are down-going bilaterally. DTRs are symmetrical throughout. Sensory, motor and coordinative functions appear intact.    Impression & Recommendations:  Problem # 1:  COUGH (ICD-786.2) Assessment: Deteriorated Due to #2. Advair 100/50 two times a day Omnaris nasal qd  Problem # 2:  ASTHMA (ICD-493.90) Assessment: Deteriorated Exacerbation. Predn. if not better. Loose wt. Albuterol as needed. His updated medication  list for this problem includes:    Prednisone 10 Mg Tabs (Prednisone) .[name]... Take 40mg  qd for 3 days, then 20 mg qd for 3 days, then 10mg  qd for 6 days, then stop. take pc.    Albuterol Sulfate 4 Mg Tabs (Albuterol sulfate) .[name]... 1 by mouth two times a day as needed wheezing   Problem # 3:  PNEUMONIA, ORGANISM UNSPECIFIED (ICD-486) Assessment: Improved Finish Levaquin today  Problem # 4:  DYSPNEA (ICD-786.05) Due to #2 No evidence of cardiac problems  Complete Medication List: 1)  Coumadin 6 Mg Tabs (Warfarin sodium) .... As dirr. 2)  Furosemide 20 Mg Tabs (Furosemide) .[name].. 1 qam 3)  Vitamin B-12 1000 Mcg Tabs (Cyanocobalamin) .[name].. 1 qd 4)  Vitamin D3 1000 Unit Tabs (Cholecalciferol) .[name].. 1 qd 5)  Triamcinolone Acetonide 0.5 % Crea (Triamcinolone acetonide) .... Bid 6)  Tussicaps 10-8 Mg Cp12  (Hydrocod polst-chlorphen polst) .[name].. 1 by mouth two times a day as needed cough 7)  Prednisone 10 Mg Tabs (Prednisone) .... Take 40mg  qd for 3 days, then 20 mg qd for 3 days, then 10mg  qd for 6 days, then stop. take pc. 8)  Albuterol Sulfate 4 Mg Tabs (Albuterol sulfate) .[name].. 1 by mouth two times a day as needed wheezing   Patient Instructions: 1)  It is important that you exercise regularly at least 20 minutes 5 times a week. If you develop chest pain, have severe difficulty breathing, or feel very tired , stop exercising immediately and seek medical attention. 2)  You need to lose weight. Consider a lower calorie diet and regular exercise.     Prescriptions: ALBUTEROL SULFATE 4 MG  TABS (ALBUTEROL SULFATE) 1 by mouth two times a day as needed wheezing  #60 x 1   Entered and Authorized by:   Tresa Garter MD   Signed by:   Tresa Garter MD on 08/26/2007   Method used:   Print then Give to Patient   RxID:   [id number] PREDNISONE 10 MG  TABS (PREDNISONE) Take 40mg  qd for 3 days, then 20 mg qd for 3 days, then 10mg  qd for 6 days, then stop. Take pc.  #qs x 0   Entered and Authorized by:   Tresa Garter MD   Signed by:   Tresa Garter MD on 08/26/2007   Method used:   Print then Give to Patient   RxID:   [id number]  ]

## 2010-07-23 NOTE — Medication Information (Signed)
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 nodules in lung  Bleeding/hemorrhagic complications: no    Recent/future hospitalizations: no    Any changes in medication regimen? no    Recent/future dental: yes     Details: needs to have dental work in next few weeks.  Will need Lovenox bridge   Any missed doses?: no       Is patient compliant with meds? yes       Allergies: 1)  ! Pcn 2)  ! Sulfa  Anticoagulation Management History:      The patient is taking warfarin and comes in today for a routine follow up visit.  Negative risk factors for bleeding include an age less than 12 years old.  The bleeding index is 'low risk'.  Negative CHADS2 values include Age > 72 years old.  The start date was 10/31/2002.  His last INR was 2.7 ratio.  Anticoagulation responsible provider: Eden Emms MD, Theron Arista.  INR POC: 2.7.  Cuvette Lot#: [id number].  Exp: 05/2011.    Anticoagulation Management Assessment/Plan:      The patient's current anticoagulation dose is Coumadin 6 mg  tabs: Take as directed by coumadin clinic..  The target INR is 2 - 3.  The next INR is due 06/10/2010.  Anticoagulation instructions were given to patient.  Results were reviewed/authorized by Weston Brass, PharmD.  He was notified by Weston Brass PharmD.         Prior Anticoagulation Instructions: INR 2.8 Continue taking a half tablet on friday. And 1 tablet all other days. Recheck in 4 weeks.   Current Anticoagulation Instructions: INR 2.7  Continue same dose of 1 tablet every day except 1/2 tablet on Friday.  Call when date for dental procedure  is set.

## 2010-07-23 NOTE — Medication Information (Signed)
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    Recent/future hospitalizations: no    Any changes in medication regimen? no    Recent/future dental: no  Any missed doses?: no       Is patient compliant with meds? yes       Current Medications (verified): 1)  Coumadin 6 Mg  Tabs (Warfarin Sodium) .... Take As Directed By Coumadin Clinic. 2)  Furosemide 20 Mg Tabs (Furosemide) .[name].. 1 Qam Po 3)  Proventil Hfa 108 (90 Base) Mcg/act Aers (Albuterol Sulfate) .... 2 Inh Qid As Needed 4)  Vitamin D3 1000 Unit  Tabs (Cholecalciferol) .[name].. 1 By Mouth Daily 5)  Vitamin B-12 1000 Mcg  Tabs (Cyanocobalamin) .[name].. 1 Qd  Allergies (verified): 1)  ! Pcn 2)  ! Sulfa  Anticoagulation Management History:      The patient is taking warfarin and comes in today for a routine follow up visit.  Negative risk factors for bleeding include an age less than 16 years old.  The bleeding index is 'low risk'.  Negative CHADS2 values include Age > 49 years old.  The start date was 10/31/2002.  His last INR was 2.3 RATIO.  Anticoagulation responsible provider: Myrtis Ser MD, Tinnie Gens.  INR POC: 2.6.  Cuvette Lot#: [id number].  Exp: 07/2010.    Anticoagulation Management Assessment/Plan:      The patient's current anticoagulation dose is Coumadin 6 mg  tabs: Take as directed by coumadin clinic..  The target INR is 2 - 3.  The next INR is due 06/14/2009.  Anticoagulation instructions were given to patient.  Results were reviewed/authorized by Cloyde Reams, RN, BSN.  He was notified by Cloyde Reams, RN, BSN.          Prior Anticoagulation Instructions: INR 4.2  Skip today's dose of coumadin then start 1 tablet daily except 1/2 tablet on Fridays.  Recheck in 2 weeks.    Current Anticoagulation Instructions: INR 2.6  Continue on same dosage 1 tablet daily except 1/2 tablet on Fridays.  Recheck in 3-4 weeks.

## 2010-07-23 NOTE — Assessment & Plan Note (Signed)
Summary: SOB/SD   Vital Signs:  Patient profile:   60 year old male Height:      71 inches Weight:      301.75 pounds BMI:     42.24 O2 Sat:      95 % on Room air Temp:     98.5 degrees F oral Pulse rate:   78 / minute BP sitting:   140 / 80  (left arm)  Vitals Entered By: Lucious Groves (October 08, 2009 10:49 AM)  O2 Flow:  Room air CC: C/O SOB/congestion x2.5 weeks, with cough that producing mucous and chest discomfort. Pt also notes increased sweating./kb Is Patient Diabetic? No Pain Assessment Patient in pain? no      Comments Patient notes that otc products (Comtrex, Nyquil) and Doxycycline have not helped./kb   CC:  C/O SOB/congestion x2.5 weeks and with cough that producing mucous and chest discomfort. Pt also notes increased sweating./kb.  History of Present Illness: The patient presents with complaints of sore throat, fever, cough, sinus congestion and drainge of 10-14 days duration. Not better with OTC meds. Chest hurts with coughing. Can't sleep due to cough. Muscle aches are present.  The mucus is colored.  C/o diarrhea . Used a HHN. Took Doxy x 10 d, not better  Current Medications (verified): 1)  Coumadin 6 Mg  Tabs (Warfarin Sodium) .... Take As Directed By Coumadin Clinic. 2)  Furosemide 20 Mg Tabs (Furosemide) .[name].. 1 Qam Po 3)  Proventil Hfa 108 (90 Base) Mcg/act Aers (Albuterol Sulfate) .... 2 Inh Qid As Needed 4)  Vitamin D3 1000 Unit  Tabs (Cholecalciferol) .[name].. 1 By Mouth Daily 5)  Vitamin B-12 1000 Mcg  Tabs (Cyanocobalamin) .[name].. 1 Qd 6)  Zyrtec Allergy 10 Mg Tbdp (Cetirizine Hcl) .[name].. 1 By Mouth Qd  Allergies (verified): 1)  ! Pcn 2)  ! 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  The patient denies chest pain, abdominal pain, and depression.    Physical Exam  General:  overweight-appearing.   Nose:   Nasal mucosa are pink and moist without lesions or exudates. Mouth:  Erythematous throat mucosa and intranasal erythema.  Lungs:  Normal respiratory effort, chest expands symmetrically. Lungs are clear to auscultation, no crackles or wheezes. Heart:  Normal rate and regular rhythm. S1 and S2 normal without gallop, murmur, click, rub or other extra sounds. Abdomen:  Bowel sounds positive,abdomen soft and non-tender without masses, organomegaly or hernias noted. Msk:  Lumbar-sacral spine is tender to palpation over paraspinal muscles and painfull with the ROM   Extremities:  trace right pedal edema.   Neurologic:  No cranial nerve deficits noted. Station and gait are normal. Plantar reflexes are down-going bilaterally. DTRs are symmetrical throughout. Sensory, motor and coordinative functions appear intact. Skin:  Intact without suspicious lesions or rashes Psych:  Cognition and judgment appear intact. Alert and cooperative with normal attention span and concentration. No apparent delusions, illusions, hallucinations   Impression & Recommendations:  Problem # 1:  DYSPNEA (ICD-786.05) due to #2 Assessment New Refused CT, CXR Call if you are not better in a reasonable amount of time or if  worse. Go to ER if feeling really bad!   Problem # 2:  BRONCHITIS, ACUTE (ICD-466.0) Assessment: New  His updated medication list for this problem includes:    Proventil Hfa 108 (90 Base) Mcg/act Aers (Albuterol sulfate) .[name]... 2 inh qid as needed    Levaquin 500 Mg Tabs (Levofloxacin) .[name]... 1 by mouth qd    Promethazine-codeine 6.25-10 Mg/55ml Syrp (Promethazine-codeine) .[name]... 5-10 ml by mouth q id as needed cough  Problem # 3:  DIARRHEA,  ACUTE (ICD-787.91) Assessment: New  His updated medication list for this problem includes:    Lomotil 2.5-0.025 Mg Tabs (Diphenoxylate-atropine) .[name]... 1-2 by mouth qid as needed diarrhea    Align Caps (Probiotic product) .[name]... 1 by mouth once daily for your intesinal flora restoraion  Problem # 4:  PULMONARY EMBOLISM, HX OF (ICD-V12.51) Assessment: Comment Only  His updated medication list for this problem includes:    Coumadin 6 Mg Tabs (Warfarin sodium) .[name]... Take as directed by coumadin clinic.  Complete Medication List: 1)  Coumadin 6 Mg Tabs (Warfarin sodium) .... Take as directed by coumadin clinic. 2)  Furosemide 20 Mg Tabs (Furosemide) .[name].. 1 qam po 3)  Proventil Hfa 108 (90 Base) Mcg/act Aers (Albuterol sulfate) .... 2 inh qid as needed 4)  Vitamin D3 1000 Unit Tabs (Cholecalciferol) .[name].. 1 by mouth daily 5)  Vitamin B-12 1000 Mcg Tabs (Cyanocobalamin) .[name].. 1 qd 6)  Zyrtec Allergy 10 Mg Tbdp (Cetirizine hcl) .[name].. 1 by mouth qd 7)  Levaquin 500 Mg Tabs (Levofloxacin) .[name].. 1 by mouth qd 8)  Prednisone 10 Mg Tabs (Prednisone) .... Take 40mg  qd for 3 days, then 20 mg qd for 3 days, then 10mg  qd for 6 days, then stop. take pc. 9)  Promethazine-codeine 6.25-10 Mg/18ml Syrp (Promethazine-codeine) .... 5-10 ml by mouth q id as needed cough 10)  Lomotil 2.5-0.025 Mg Tabs (Diphenoxylate-atropine) .[name].. 1-2 by mouth qid as needed diarrhea 11)  Align Caps (Probiotic product) .[name].. 1 by mouth once daily for your intesinal flora restoraion  Patient Instructions: 1)  Please schedule a follow-up appointment in 2 weeks. 2)  Call if you are not better in a reasonable amount of time or if worse. Go to ER if feeling really bad!  3)  Use over-the-counter medicines for "cold": Tylenol  650mg  or Advil 400mg  every 6 hours  for fever; Delsym or Robutussin for cough. Mucinex or Mucinex D for congestion. Ricola or Halls for sore throat. Prescriptions: ALIGN  CAPS (PROBIOTIC PRODUCT) 1 by mouth once daily  for your intesinal flora restoraion  #30 x 1   Entered and Authorized by:   Tresa Garter MD   Signed by:   Tresa Garter MD on 10/08/2009   Method used:   Print then Give to Patient   RxID:   [id number] LOMOTIL 2.5-0.025 MG TABS (DIPHENOXYLATE-ATROPINE) 1-2 by mouth qid as needed diarrhea  #60 x 1   Entered and Authorized by:   Tresa Garter MD   Signed by:   Tresa Garter MD on 10/08/2009   Method used:   Print then Give to Patient   RxID:   [id number] PROMETHAZINE-CODEINE 6.25-10 MG/5ML SYRP (PROMETHAZINE-CODEINE) 5-10 ml by mouth q id as needed cough  #300 ml x 0   Entered and Authorized by:   Tresa Garter MD   Signed by:   Tresa Garter MD on 10/08/2009   Method used:   Print then Give to Patient   RxID:   [id number] PREDNISONE  10 MG TABS (PREDNISONE) Take 40mg  qd for 3 days, then 20 mg qd for 3 days, then 10mg  qd for 6 days, then stop. Take pc.  #24 x 1   Entered and Authorized by:   Tresa Garter MD   Signed by:   Tresa Garter MD on 10/08/2009   Method used:   Print then Give to Patient   RxID:   [id number] LEVAQUIN 500 MG TABS (LEVOFLOXACIN) 1 by mouth qd  #10 x 0   Entered and Authorized by:   Tresa Garter MD   Signed by:   Tresa Garter MD on 10/08/2009   Method used:   Print then Give to Patient   RxID:   [id number]

## 2010-07-23 NOTE — Progress Notes (Signed)
Summary: Oral proc  Phone Note Other Incoming   Caller: Jodie at Dr. Chipper Herb office [phone] Summary of Call: Does pt need to be seen before oral surgery or have INR checked again?  They are not clear on if he is ok to have it scheduled now. Please advise Initial call taken by: Lanier Prude, [facility]),  May 27, 2010 3:14 PM  Follow-up for Phone Call        He has to go to Coum clinic 1-2 d prior to procedure and have his INR checked Follow-up by: Tresa Garter MD,  May 28, 2010 8:43 AM  Additional Follow-up for Phone Call Additional follow up Details #1::        Dentist aware Additional Follow-up by: Lamar Sprinkles, CMA,  May 28, 2010 11:15 AM

## 2010-07-23 NOTE — Progress Notes (Signed)
Summary: correct info  Phone Note Call from Patient Call back at [phone]   Caller: Patient Call For: alva Reason for Call: Talk to Nurse Summary of Call: Patient calling to give correct info to [facility] R.  He was seen yesterday and gave the wrong strength of augmentin he is taking--the correct dosage is 500-25, also last pnuemonia shot was Apr 04 2008. Initial call taken by: Lehman Prom,  May 31, 2010 9:23 AM  Follow-up for Phone Call        Will forward to RA so he is aware. Follow-up by: Vernie Murders,  May 31, 2010 9:30 AM  Additional Follow-up for Phone Call Additional follow up Details #1::        oK. pl update immunization Additional Follow-up by: Comer Locket. Vassie Loll MD,  May 31, 2010 12:47 PM

## 2010-07-23 NOTE — Miscellaneous (Signed)
Summary: Orders Update  Clinical Lists Changes  Orders: Added new Test order of Venous Duplex Lower Extremity (Venous Duplex Lower) - Signed 

## 2010-07-23 NOTE — Progress Notes (Signed)
Summary: Not feeling well  Phone Note Call from Patient Call back at Home Phone [phone] Call back at [phone] cell   Caller: Spouse-melinda Summary of Call: Paitne wife called very concerned about patient lack of progress. Patient was seen 08/16/07 for pneumonia and started on Levaquin and zithromax. Patient c/o fatigue, grasping for breath, coughing, and medication just has not kicked it. Wife would like to know what MD recommends Initial call taken by: Rock Nephew CMA,  August 25, 2007 4:39 PM  Follow-up for Phone Call        OV tomorrow 11:45 am Follow-up by: Tresa Garter MD,  August 25, 2007 5:46 PM  Additional Follow-up for Phone Call Additional follow up Details #1::        Phone Call Completed, Appt Scheduled Today for 11:45am Additional Follow-up by: Jonnie Finner,  August 26, 2007 9:50 AM

## 2010-07-23 NOTE — Medication Information (Signed)
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 no    Recent/future dental: no  Any missed doses?: no       Is patient compliant with meds? yes       Allergies: 1)  ! Pcn 2)  ! Sulfa  Anticoagulation Management History:      The patient is taking warfarin and comes in today for a routine follow up visit.  Negative risk factors for bleeding include an age less than 33 years old.  The bleeding index is 'low risk'.  Negative CHADS2 values include Age > 39 years old.  The start date was 10/31/2002.  His last INR was 2.3 RATIO.  Anticoagulation responsible provider: Jens Som MD, Arlys John.  INR POC: 2.7.  Cuvette Lot#: [id number].  Exp: 02/2011.    Anticoagulation Management Assessment/Plan:      The patient's current anticoagulation dose is Coumadin 6 mg  tabs: Take as directed by coumadin clinic..  The target INR is 2 - 3.  The next INR is due 01/17/2010.  Anticoagulation instructions were given to patient.  Results were reviewed/authorized by Cloyde Reams, RN, BSN.  He was notified by Cloyde Reams RN.         Prior Anticoagulation Instructions: INR 3.7. Hold Coumadin today, then take 1 tablet daily except 0.5 tablet on Fridays. Recheck in 2 weeks.  Current Anticoagulation Instructions: INR 2.7  Continue on same dosage 1 tablet daily except 1/2 tablet on Fridays.  Recheck in 4 weeks.

## 2010-07-23 NOTE — Medication Information (Signed)
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 yes       Details: on prednisone taper- 40mg  x 3 days and 30mg  x 1 day; finished doxycycline 3 days ago.  Has a prescription for Levaquin but has not started yet  Recent/future dental: no  Any missed doses?: no       Is patient compliant with meds? yes       Allergies: 1)  ! Pcn 2)  ! Sulfa  Anticoagulation Management History:      The patient is taking warfarin and comes in today for a routine follow up visit.  Negative risk factors for bleeding include an age less than 43 years old.  The bleeding index is 'low risk'.  Negative CHADS2 values include Age > 61 years old.  The start date was 10/31/2002.  His last INR was 2.3 RATIO.  Anticoagulation responsible provider: Rejina Odle MD, Reuel Boom.  INR POC: 3.0.  Cuvette Lot#: [id number].  Exp: 10/2010.    Anticoagulation Management Assessment/Plan:      The patient's current anticoagulation dose is Coumadin 6 mg  tabs: Take as directed by coumadin clinic..  The target INR is 2 - 3.  The next INR is due 11/08/2009.  Anticoagulation instructions were given to patient.  Results were reviewed/authorized by Weston Brass, PharmD.  He was notified by Weston Brass PharmD.         Prior Anticoagulation Instructions: INR 2.3. Take 1 tablet daily except 0.5 tablet on Fridays.  Current Anticoagulation Instructions: INR 3.0  Continue same dose of 1 tablet every day except 1/2 tablet on Friday

## 2010-07-23 NOTE — Progress Notes (Signed)
Summary: req for abx & FYI pt fell  Phone Note Call from Patient Call back at [facility] Phone [phone]   Caller: Wife [phone] Summary of Call: Pt has hx of blocked femoral & popliteal artery. Pt fell and cut his leg Saturday. He had chest pain & sob after fall. Pt is currently on coumadin. He says that the chest tightness and sob has mostly resolved. Pt says to tell the Dr that the ER is not an option, he does not want to go.  Sunday he began having a fever, sinus congestion & drainage & non productive cough. He is requesting an antibiotic. Initial call taken by: Lamar Sprinkles,  June 09, 2007 10:06 AM  Follow-up for Phone Call        Per Dr Posey Rea, call in zpak, Pt informed  Follow-up by: Lamar Sprinkles,  June 09, 2007 3:35 PM  Additional Follow-up for Phone Call Additional follow up Details #1::        ok Additional Follow-up by: Tresa Garter MD,  June 09, 2007 5:25 PM    New/Updated Medications: ZITHROMAX Z-PAK 250 MG  TABS (AZITHROMYCIN) as directed   Prescriptions: ZITHROMAX Z-PAK 250 MG  TABS (AZITHROMYCIN) as directed  #1 x 0   Entered and Authorized by:   Tresa Garter MD   Signed by:   Tresa Garter MD on 06/09/2007   Method used:   Print then Give to Patient   RxID:   [id number]

## 2010-07-23 NOTE — Miscellaneous (Signed)
Summary: Special Procedure/[facility]  Special Procedure/[facility]   Imported By: [place] 02/15/2010 10:43:18  _____________________________________________________________________  External Attachment:    Type:   Image     Comment:   External Document

## 2010-07-23 NOTE — Assessment & Plan Note (Signed)
Summary: ear pain/drainage/SD   Vital Signs:  Patient profile:   60 year old male Height:      71 inches Weight:      310 pounds BMI:     43.39 O2 Sat:      97 % on Room air Temp:     98.2 degrees F oral Pulse rate:   94 / minute BP sitting:   148 / 82  (left arm) Cuff size:   large  Vitals Entered By: Bill Salinas CMA (Nov 14, 2009 11:15 AM)  O2 Flow:  Room air CC: pt here with c/o Right earache/ pt no longer taking prednisone or promethazine cough syrup/ ab   CC:  pt here with c/o Right earache/ pt no longer taking prednisone or promethazine cough syrup/ ab.  History of Present Illness: C/o R earache 9 mo. He had an absscessed tooth lower R 1-2 wks ago - took Erythromycin. Abx helped the ear, but pain is back now. He just had a root canal. He gained wt. Cont. to have LBP.  Current Medications (verified): 1)  Coumadin 6 Mg  Tabs (Warfarin Sodium) .... Take As Directed By Coumadin Clinic. 2)  Furosemide 20 Mg Tabs (Furosemide) .[name].. 1 Qam Po 3)  Proventil Hfa 108 (90 Base) Mcg/act Aers (Albuterol Sulfate) .... 2 Inh Qid As Needed 4)  Vitamin D3 1000 Unit  Tabs (Cholecalciferol) .[name].. 1 By Mouth Daily 5)  Vitamin B-12 1000 Mcg  Tabs (Cyanocobalamin) .[name].. 1 Qd 6)  Zyrtec Allergy 10 Mg Tbdp (Cetirizine Hcl) .[name].. 1 By Mouth Qd 7)  Levaquin 500 Mg Tabs (Levofloxacin) .[name].. 1 By Mouth Qd 8)  Prednisone 10 Mg Tabs (Prednisone) .... Take 40mg  Qd For 3 Days, Then 20 Mg Qd For 3 Days, Then 10mg  Qd For 6 Days, Then Stop. Take Pc. 9)  Promethazine-Codeine 6.25-10 Mg/34ml Syrp (Promethazine-Codeine) .... 5-10 Ml By Mouth Q Id As Needed Cough 10)  Lomotil 2.5-0.025 Mg Tabs (Diphenoxylate-Atropine) .[name].. 1-2 By Mouth Qid As Needed Diarrhea 11)  Align  Caps (Probiotic Product) .[name].. 1 By Mouth Once Daily For Your Intesinal Flora Restoraion  Allergies (verified): 1)  ! Pcn 2)  ! 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 Family History High cholesterol Family History Hypertension Parkinson's  Social History: Reviewed history from 04/23/2007 and no changes required. Occupation: disabled Married Never Smoked Alcohol use-no  Review of Systems  The patient denies fever, dyspnea on exertion, and abdominal pain.    Physical Exam  General:  overweight-appearing.   Ears:  R ear canal is swollen and tender Mouth:  Erythematous throat mucosa and intranasal erythema.  Lungs:  Normal respiratory effort, chest expands symmetrically. Lungs are clear to auscultation, no crackles or wheezes. Heart:  Normal rate and regular rhythm. S1 and S2 normal without gallop, murmur, click, rub or other extra sounds. Msk:  Lumbar-sacral spine is tender to palpation over paraspinal muscles and painfull with the ROM   Skin:  SD on face and in B ear Psych:  Cognition and judgment appear intact. Alert and cooperative with normal attention span and concentration. No apparent delusions, illusions, hallucinations   Impression & Recommendations:  Problem # 1:  OTITIS EXTERNA (ICD-380.10) R  Assessment New  His updated medication list for this problem includes:    Cipro Hc 0.2-1 % Susp (Ciprofloxacin-hydrocortisone) .Marland KitchenMarland KitchenMarland KitchenMarland Kitchen 3 gtt two times a day in r ear. ok generic subst.  Problem # 2:  SEBORRHEIC DERMATITIS (ICD-690.10) Assessment: Deteriorated Lotrisone two times a day   Problem # 3:  LOW BACK PAIN (ICD-724.2) Assessment: Unchanged OTC meds. He has Percocet at home but not using it due to nausea  Problem # 4:  OBESITY (ICD-278.00) Assessment: Deteriorated Wt loss discussed  Complete Medication List: 1)  Coumadin 6 Mg  Tabs (Warfarin sodium) .... Take as directed by coumadin clinic. 2)  Furosemide 20 Mg Tabs (Furosemide) .[name].. 1 qam po 3)  Proventil Hfa 108 (90 Base) Mcg/act Aers (Albuterol sulfate) .... 2 inh qid as needed 4)  Vitamin D3 1000 Unit Tabs (Cholecalciferol) .[name].. 1 by mouth daily 5)  Vitamin B-12 1000 Mcg Tabs (Cyanocobalamin) .[name].. 1 qd 6)  Zyrtec Allergy 10 Mg Tbdp (Cetirizine hcl) .[name].. 1 by mouth qd 7)  Levaquin 500 Mg Tabs (Levofloxacin) .[name].. 1 by mouth qd 8)  Prednisone 10 Mg Tabs (Prednisone) .... Take 40mg  qd for 3 days, then 20 mg qd for 3 days, then 10mg  qd for 6 days, then stop. take pc. 9)  Promethazine-codeine 6.25-10 Mg/49ml Syrp (Promethazine-codeine) .... 5-10 ml by mouth q id as needed cough 10)  Lomotil 2.5-0.025 Mg Tabs (Diphenoxylate-atropine) .[name].. 1-2 by mouth qid as needed diarrhea 11)  Align Caps (Probiotic product) .[name].. 1 by mouth once daily for your intesinal flora restoraion 12)  Lotrisone 1-0.05 % Crea (Clotrimazole-betamethasone) .... Use bid 13)  Cipro Hc 0.2-1 % Susp (Ciprofloxacin-hydrocortisone) .... 3 gtt two times a day in r ear. ok generic subst.  Patient Instructions: 1)  Call if you are not better in a reasonable amount of time or if worse.  Prescriptions: CIPRO HC 0.2-1 % SUSP (CIPROFLOXACIN-HYDROCORTISONE) 3 gtt two times a day in R ear. OK generic subst.  #1 x 1   Entered and Authorized by:   Tresa Garter MD   Signed by:   Tresa Garter MD on 11/14/2009   Method used:   Electronically to        [facility]* (retail)       [address] [place], [place]  [address]       Ph: [phone] or [phone]       Fax: [phone]   RxID:   [id number] LOTRISONE 1-0.05 % CREA (CLOTRIMAZOLE-BETAMETHASONE) use bid  #90 g x 1   Entered and Authorized by:   Tresa Garter MD   Signed by:   Tresa Garter MD on 11/14/2009   Method used:   Electronically to        [facility]* (retail)       [address] [place], [place]  [address]       Ph: [phone] or [phone]       Fax: [phone]   RxID:   [id number]

## 2010-07-23 NOTE — Medication Information (Signed)
Summary: rov/ewj  Anticoagulant Therapy  Managed by: Charolotte Eke, PharmD Referring MD: Sonda Primes Supervising MD: Jens Som MD, Arlys John Indication 1: Deep Vein Thrombosis - Leg (ICD-451.1) Indication 2: Pulmonary embolus (ICD-415.19) Lab Used: [facility] Site: [facility] INR POC 2.6 INR RANGE 2 - 3  Dietary changes: no    Health status changes: no    Bleeding/hemorrhagic complications: no    Recent/future hospitalizations: no    Any changes in medication regimen? no    Recent/future dental: no  Any missed doses?: no       Is patient compliant with meds? yes       Current Medications (verified): 1)  Coumadin 6 Mg  Tabs (Warfarin Sodium) .... Take As Directed By Coumadin Clinic. 2)  Furosemide 20 Mg Tabs (Furosemide) .[name].. 1 Qam Po 3)  Proventil Hfa 108 (90 Base) Mcg/act Aers (Albuterol Sulfate) .... 2 Inh Qid As Needed 4)  Vitamin D3 1000 Unit  Tabs (Cholecalciferol) .[name].. 1 By Mouth Daily 5)  Vitamin B-12 1000 Mcg  Tabs (Cyanocobalamin) .[name].. 1 Qd 6)  Zyrtec Allergy 10 Mg Tbdp (Cetirizine Hcl) .[name].. 1 By Mouth Qd 7)  Levaquin 500 Mg Tabs (Levofloxacin) .[name].. 1 By Mouth Qd 8)  Prednisone 10 Mg Tabs (Prednisone) .... Take 40mg  Qd For 3 Days, Then 20 Mg Qd For 3 Days, Then 10mg  Qd For 6 Days, Then Stop. Take Pc. 9)  Promethazine-Codeine 6.25-10 Mg/35ml Syrp (Promethazine-Codeine) .... 5-10 Ml By Mouth Q Id As Needed Cough 10)  Lomotil 2.5-0.025 Mg Tabs (Diphenoxylate-Atropine) .[name].. 1-2 By Mouth Qid As Needed Diarrhea 11)  Align  Caps (Probiotic Product) .[name].. 1 By Mouth Once Daily For Your Intesinal Flora Restoraion 12)  Cipro Hc 0.2-1 % Susp (Ciprofloxacin-Hydrocortisone) .... 3 Gtt Two Times A Day in R Ear. Ok Generic Subst.  Allergies (verified): 1)  ! Pcn 2)  ! Sulfa  Anticoagulation Management History:      The patient is taking warfarin and comes in today for a routine follow up visit.  Negative risk factors for bleeding include an age less than 44 years old.  The  bleeding index is 'low risk'.  Negative CHADS2 values include Age > 37 years old.  The start date was 10/31/2002.  His last INR was 2.3 RATIO.  Anticoagulation responsible provider: Jens Som MD, Arlys John.  INR POC: 2.6.  Cuvette Lot#: [id number].  Exp: 03/24/2011.    Anticoagulation Management Assessment/Plan:      The patient's current anticoagulation dose is Coumadin 6 mg  tabs: Take as directed by coumadin clinic..  The target INR is 2 - 3.  The next INR is due 02/14/2010.  Anticoagulation instructions were given to patient.  Results were reviewed/authorized by Charolotte Eke, PharmD.  He was notified by Charolotte Eke, PharmD.         Prior Anticoagulation Instructions: INR 2.7  Continue on same dosage 1 tablet daily except 1/2 tablet on Fridays.  Recheck in 4 weeks.    Current Anticoagulation Instructions: INR therapeutic. Continue 6mg  daily except 3mg  on Fridays.

## 2010-07-23 NOTE — Letter (Signed)
Summary: Request for Medical Clearance  Request for Medical Clearance   Imported By: Marylou Mccoy 05/31/2010 18:27:07  _____________________________________________________________________  External Attachment:    Type:   Image     Comment:   External Document

## 2010-07-23 NOTE — Medication Information (Signed)
Summary: rov/ewj  Anticoagulant Therapy  Managed by: Cloyde Reams, RN, BSN Referring MD: Sonda Primes Supervising MD: Eden Emms MD, Theron Arista Indication 1: Deep Vein Thrombosis - Leg (ICD-451.1) Indication 2: Pulmonary embolus (ICD-415.19) Lab Used: [facility] Site: [facility] INR POC 3.6 INR RANGE 2 - 3  Dietary changes: no    Health status changes: no    Bleeding/hemorrhagic complications: no    Recent/future hospitalizations: no    Any changes in medication regimen? no    Recent/future dental: no  Any missed doses?: no       Is patient compliant with meds? yes      Comments: Pt is very hesitant to decr dosage.  He wants to stay above 3.0.  Current Medications (verified): 1)  Coumadin 6 Mg  Tabs (Warfarin Sodium) .... Take As Directed By Coumadin Clinic. 2)  Furosemide 20 Mg Tabs (Furosemide) .[name].. 1 Qam 3)  Vitamin B-12 1000 Mcg  Tabs (Cyanocobalamin) .[name].. 1 Qd 4)  Proventil Hfa 108 (90 Base) Mcg/act Aers (Albuterol Sulfate) .... 2 Inh Qid As Needed  Allergies (verified): 1)  ! Pcn 2)  ! Sulfa  Anticoagulation Management History:      The patient is taking warfarin and comes in today for a routine follow up visit.  Negative risk factors for bleeding include an age less than 17 years old.  The bleeding index is 'low risk'.  Negative CHADS2 values include Age > 54 years old.  The start date was 10/31/2002.  His last INR was 2.3 RATIO.  Anticoagulation responsible provider: Eden Emms MD, Theron Arista.  INR POC: 3.6.  Cuvette Lot#: [id number].  Exp: 03/2010.    Anticoagulation Management Assessment/Plan:      The patient's current anticoagulation dose is Coumadin 6 mg  tabs: Take as directed by coumadin clinic..  The target INR is 2 - 3.  The next INR is due 04/04/2009.  Anticoagulation instructions were given to patient.  Results were reviewed/authorized by Cloyde Reams, RN, BSN.  He was notified by Cloyde Reams RN.         Prior Anticoagulation Instructions: INR 3.2  Continue  on 6 mg daily.  Recheck in 4 weeks.    Current Anticoagulation Instructions: INR 3.6  Skip today's dose of coumadin then resume 6mg  days.  Recheck in 3 weeks.

## 2010-07-25 NOTE — Medication Information (Signed)
Summary: rov/sp  Anticoagulant Therapy  Managed by: Weston Brass, PharmD Referring MD: Sonda Primes PCP: Tresa Garter MD Supervising MD: Tenny Craw MD, Gunnar Fusi Indication 1: Deep Vein Thrombosis - Leg (ICD-451.1) Indication 2: Pulmonary embolus (ICD-415.19) Lab Used: [facility] Site: [facility] INR POC 2.5 INR RANGE 2 - 3  Dietary changes: no    Health status changes: no    Bleeding/hemorrhagic complications: no    Recent/future hospitalizations: no    Any changes in medication regimen? no    Recent/future dental: no  Any missed doses?: no       Is patient compliant with meds? yes       Allergies: 1)  ! Pcn 2)  ! Sulfa 3)  Hydrocodone-Acetaminophen (Hydrocodone-Acetaminophen) 4)  Oxycodone Hcl  Anticoagulation Management History:      The patient is taking warfarin and comes in today for a routine follow up visit.  Negative risk factors for bleeding include an age less than 19 years old.  The bleeding index is 'low risk'.  Negative CHADS2 values include Age > 14 years old.  The start date was 10/31/2002.  His last INR was 2.7 ratio.  Anticoagulation responsible provider: Tenny Craw MD, Gunnar Fusi.  INR POC: 2.5.  Cuvette Lot#: [id number].  Exp: 07/2011.    Anticoagulation Management Assessment/Plan:      The patient's current anticoagulation dose is Coumadin 6 mg  tabs: Take as directed by coumadin clinic..  The target INR is 2 - 3.  The next INR is due 07/15/2010.  Anticoagulation instructions were given to patient.  Results were reviewed/authorized by Weston Brass, PharmD.         Prior Anticoagulation Instructions: INR 2.7  Continue same dose of 1 tablet every day except 1/2 tablet on Friday.   Recheck INR in 4 weeks.   Current Anticoagulation Instructions: INR 2.5 (INR goal: 2-3)  Take 1 tablet everyday except 1/2 tablet on Fridays.  Recheck in 4  weeks.

## 2010-07-25 NOTE — Medication Information (Signed)
Summary: rov/ewj  Anticoagulant Therapy  Managed by: Weston Brass, PharmD Referring MD: Sonda Primes PCP: Tresa Garter MD Supervising MD: Myrtis Ser MD, Tinnie Gens Indication 1: Deep Vein Thrombosis - Leg (ICD-451.1) Indication 2: Pulmonary embolus (ICD-415.19) Lab Used: [facility] Site: [facility] INR POC 2.7 INR RANGE 2 - 3  Dietary changes: no    Health status changes: no    Bleeding/hemorrhagic complications: no    Recent/future hospitalizations: no    Any changes in medication regimen? no    Recent/future dental: no  Any missed doses?: no       Is patient compliant with meds? yes       Allergies: 1)  ! Pcn 2)  ! Sulfa  Anticoagulation Management History:      The patient is taking warfarin and comes in today for a routine follow up visit.  Negative risk factors for bleeding include an age less than 59 years old.  The bleeding index is 'low risk'.  Negative CHADS2 values include Age > 59 years old.  The start date was 10/31/2002.  His last INR was 2.7 ratio.  Anticoagulation responsible provider: Myrtis Ser MD, Tinnie Gens.  INR POC: 2.7.  Exp: 07/2011.    Anticoagulation Management Assessment/Plan:      The patient's current anticoagulation dose is Coumadin 6 mg  tabs: Take as directed by coumadin clinic..  The target INR is 2 - 3.  The next INR is due 07/15/2010.  Anticoagulation instructions were given to patient.  Results were reviewed/authorized by Weston Brass, PharmD.  He was notified by Weston Brass PharmD.         Prior Anticoagulation Instructions: INR 1.8  Take 2 tablets today, then resume same dosage 1 tablet daily except 1/2 tablet on Fridays.  Recheck in 1 week.   Current Anticoagulation Instructions: INR 2.7  Continue same dose of 1 tablet every day except 1/2 tablet on Friday.   Recheck INR in 4 weeks.

## 2010-07-25 NOTE — Assessment & Plan Note (Signed)
Summary: CHEST PAIN--BODY PAIN/MUSCLES-HOARSENESS--STC   Vital Signs:  Patient profile:   60 year old male Height:      71 inches Weight:      300 pounds BMI:     41.99 Temp:     98.7 degrees F oral Pulse rate:   76 / minute Pulse rhythm:   regular Resp:     16 per minute BP sitting:   140 / 80  (left arm) Cuff size:   large  Vitals Entered By: Lanier Prude, Beverly Gust) (June 18, 2010 9:27 AM) CC: chest/throat burning, congestion, hoarseness X 3 days, worsening muscle pain & numbness in hands and arms Is Patient Diabetic? No   Primary Care Provider:  Tresa Garter MD  CC:  chest/throat burning, congestion, hoarseness X 3 days, and worsening muscle pain & numbness in hands and arms.  History of Present Illness: The patient presents with complaints of sore throat, fever, cough, sinus congestion and drainge of several days duration. Not better with OTC meds. Chest hurts with coughing. Can't sleep due to cough. Muscle aches are present.  The mucus is colored.  C/o LBP and L leg pain, occasional hand numbness  Current Medications (verified): 1)  Coumadin 6 Mg  Tabs (Warfarin Sodium) .... Take As Directed By Coumadin Clinic. 2)  Furosemide 20 Mg Tabs (Furosemide) .[name].. 1 Qam Po 3)  Proventil Hfa 108 (90 Base) Mcg/act Aers (Albuterol Sulfate) .... 2 Inh Qid As Needed 4)  Vitamin D3 1000 Unit  Tabs (Cholecalciferol) .[name].. 1 By Mouth Daily 5)  Zyrtec Allergy 10 Mg Tbdp (Cetirizine Hcl) .[name].. 1 By Mouth Once Daily Seasonal 6)  Promethazine-Codeine 6.25-10 Mg/64ml Syrp (Promethazine-Codeine) .... 5-10 Ml By Mouth Q Id As Needed Cough 7)  Lomotil 2.5-0.025 Mg Tabs (Diphenoxylate-Atropine) .[name].. 1-2 By Mouth Qid As Needed Diarrhea 8)  Omega-3 Cf 1000 Mg Caps (Omega-3 Fatty Acids) .[name].. 1 Once Daily 9)  Glucosamine 500 Mg Caps (Glucosamine Sulfate) .[name].. 1 Once Daily 10)  Niacin 500 Mg Tabs (Niacin) .[name].. 1 Once Daily 11)  B Complex  Tabs (B Complex Vitamins) .[name].. 1 Once Daily 12)   Enoxaparin Sodium 150 Mg/ml Soln (Enoxaparin Sodium) .... Inject 1 Syringe Subcutaneously Two Times A Day As Directed  Allergies (verified): 1)  ! Pcn 2)  ! 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    Physical Exam  General:  overweight-appearing.   Nose:   Nasal mucosa are pink and moist without lesions or exudates. Mouth:  Erythematous throat mucosa and intranasal erythema.  Neck:  No deformities, masses, or tenderness noted. Lungs:  Normal respiratory effort, chest expands symmetrically. Lungs are clear to auscultation, no crackles or wheezes. Heart:  Normal rate and regular rhythm. S1 and S2 normal without gallop, murmur, click, rub or other extra sounds. Abdomen:  Bowel sounds positive,abdomen soft and non-tender without masses, organomegaly or hernias noted. Msk:  Lumbar-sacral spine is tender to palpation over paraspinal muscles and painfull with the ROM  R hip tender w/flexion and internal rotation  Skin:  R distal forearm w/pigmented growth Psych:  Cognition and judgment appear intact. Alert and cooperative  with normal attention span and concentration. No apparent delusions, illusions, hallucinations   Impression & Recommendations:  Problem # 1:  UPPER RESPIRATORY INFECTION, ACUTE (ICD-465.9) Assessment New  His updated medication list for this problem includes:    Zyrtec Allergy 10 Mg Tbdp (Cetirizine hcl) .[name]... 1 by mouth once daily seasonal    Promethazine-codeine 6.25-10 Mg/9ml Syrp (Promethazine-codeine) .[name]... 5-10 ml by  mouth q id as needed cough  Problem # 2:  OSTEOARTHRITIS (ICD-715.90) Assessment: Deteriorated F/u w/Dr Bean His updated medication list for this problem includes:    Tramadol Hcl 50 Mg Tabs (Tramadol hcl) .[name]... 1-2 tabs by mouth two times a day as needed pain  Problem # 3:  PARESTHESIA (ICD-782.0) UE B - CTS Assessment: New Check B12, TSH F/u with Ortho  Complete Medication List: 1)  Coumadin 6 Mg Tabs (Warfarin sodium) .... Take as directed by coumadin clinic. 2)  Furosemide 20 Mg Tabs (Furosemide) .[name].. 1 qam po 3)  Proventil Hfa 108 (90 Base) Mcg/act Aers (Albuterol sulfate) .... 2 inh qid as needed 4)  Vitamin D3 1000 Unit Tabs (Cholecalciferol) .[name].. 1 by mouth daily 5)  Zyrtec Allergy 10 Mg Tbdp (Cetirizine hcl) .[name].. 1 by mouth once daily seasonal 6)  Promethazine-codeine 6.25-10 Mg/59ml Syrp (Promethazine-codeine) .... 5-10 ml by mouth q id as needed cough 7)  Lomotil 2.5-0.025 Mg Tabs (Diphenoxylate-atropine) .[name].. 1-2 by mouth qid as needed diarrhea 8)  Omega-3 Cf 1000 Mg Caps (Omega-3 fatty acids) .[name].. 1 once daily 9)  Glucosamine 500 Mg Caps (Glucosamine sulfate) .[name].. 1 once daily 10)  Niacin 500 Mg Tabs (Niacin) .[name].. 1 once daily 11)  B Complex Tabs (B complex vitamins) .[name].. 1 once daily 12)  Enoxaparin Sodium 150 Mg/ml Soln (Enoxaparin sodium) .... Inject 1 syringe subcutaneously two times a day as directed 13)  Tramadol Hcl 50 Mg Tabs (Tramadol hcl) .[name].. 1-2 tabs by mouth two times a day as needed pain 14)  Zithromax Z-pak 250 Mg Tabs (Azithromycin) .... As dirrected  Patient Instructions: 1)  Keep return office visit 1 month 2)  BMP prior to visit, ICD-9: v70.0 3)  Hepatic Panel prior to visit, ICD-9: 4)  Lipid Panel prior to visit, ICD-9: 5)  TSH prior to visit, ICD-9: 6)  CBC w/ Diff prior to visit, ICD-9: 7)  Urine-dip prior to visit, ICD-9: 8)  PSA prior to visit, ICD-9: 9)  Vit B12 266.20 Prescriptions: ZITHROMAX Z-PAK 250 MG TABS (AZITHROMYCIN) as dirrected   #1 x 0   Entered and Authorized by:   Tresa Garter MD   Signed by:   Tresa Garter MD on 06/18/2010   Method used:   Electronically to        [facility]* (retail)       [address] [place], [place]  [address]       Ph: [phone] or [phone]       Fax: [phone]   RxID:   [id number] TRAMADOL HCL 50 MG TABS (TRAMADOL HCL) 1-2 tabs by mouth two times a day as needed pain  #120 x 3   Entered and Authorized by:   Tresa Garter MD   Signed by:   Tresa Garter MD on 06/18/2010   Method used:   Electronically to        [facility]* (retail)  [address] [place], [place]  [address]       Ph: [phone] or [phone]       Fax: [phone]   RxID:   [id number]    Orders Added: 1)  Est. Patient Level IV [27253]

## 2010-07-25 NOTE — Medication Information (Signed)
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 yes       Details: has been taking Lovenox injections  Recent/future dental: yes     Details: having abcessed tooth pulled today  Any missed doses?: yes     Details: Has been holding Coumadin for dental work today.    Allergies: 1)  ! Pcn 2)  ! Sulfa  Anticoagulation Management History:      The patient is taking warfarin and comes in today for a routine follow up visit.  Negative risk factors for bleeding include an age less than 51 years old.  The bleeding index is 'low risk'.  Negative CHADS2 values include Age > 49 years old.  The start date was 10/31/2002.  His last INR was 2.7 ratio.  Anticoagulation responsible provider: Juanda Chance MD, Smitty Cords.  INR POC: 1.5.  Exp: 05/2011.    Anticoagulation Management Assessment/Plan:      The patient's current anticoagulation dose is Coumadin 6 mg  tabs: Take as directed by coumadin clinic..  The target INR is 2 - 3.  The next INR is due 06/10/2010.  Anticoagulation instructions were given to patient.  Results were reviewed/authorized by Weston Brass, PharmD.  He was notified by Weston Brass PharmD.         Prior Anticoagulation Instructions: INR 2.7  Continue same dose of 1 tablet every day except 1/2 tablet on Friday.  Call when date for dental procedure is set.   Current Anticoagulation Instructions: INR 1.5  Take 1 1/2 tablets of Coumadin today and tomorrow then resume same dose of Coumadin.  Restart Lovenox tonight.  Take 2 injections on  Tuesday and Wednesday.  Recheck INR in 1 week.

## 2010-07-25 NOTE — Medication Information (Signed)
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 yes       Details: Pt has been on Lovenox, stopped Lovenox last dose 06/04/10 pm d/t incr bruising on abdomen..  Recent/future dental: yes     Details: Recent dental extraction off Coumadin bridged with Lovenox.    Any missed doses?: yes     Details: Held prior to dental extraction on 06/03/10.  Is patient compliant with meds? yes      Comments: Completed Augmentin, for abcessed tooth.    Allergies: 1)  ! Pcn 2)  ! Sulfa  Anticoagulation Management History:      The patient is taking warfarin and comes in today for a routine follow up visit.  Negative risk factors for bleeding include an age less than 10 years old.  The bleeding index is 'low risk'.  Negative CHADS2 values include Age > 41 years old.  The start date was 10/31/2002.  His last INR was 2.7 ratio.  Anticoagulation responsible provider: Shirlee Latch MD, Edword Cu.  INR POC: 1.8.  Cuvette Lot#: [id number].  Exp: 07/2011.    Anticoagulation Management Assessment/Plan:      The patient's current anticoagulation dose is Coumadin 6 mg  tabs: Take as directed by coumadin clinic..  The target INR is 2 - 3.  The next INR is due 06/18/2010.  Anticoagulation instructions were given to patient.  Results were reviewed/authorized by Cloyde Reams, RN, BSN.  He was notified by Cloyde Reams RN.         Prior Anticoagulation Instructions: INR 1.5  Take 1 1/2 tablets of Coumadin today and tomorrow then resume same dose of Coumadin.  Restart  Lovenox tonight.  Take 2 injections on Tuesday and Wednesday.  Recheck INR in 1 week.   Current Anticoagulation Instructions: INR 1.8  Take 2 tablets today, then resume same dosage 1 tablet daily except 1/2 tablet on Fridays.  Recheck in 1 week.

## 2010-07-31 NOTE — Assessment & Plan Note (Signed)
Summary: 3 MO ROV /NWS  #   Vital Signs:  Patient profile:   60 year old male Height:      71 inches Weight:      302 pounds BMI:     42.27 Temp:     97.7 degrees F oral Pulse rate:   84 / minute Pulse rhythm:   regular Resp:     16 per minute BP sitting:   140 / 80  (left arm) Cuff size:   large  Vitals Entered By: Lanier Prude, CMA(AAMA) (July 23, 2010 8:07 AM) CC: 3 mo f/u  Is Patient Diabetic? No Comments pt states Tramadol is not helping with pain   Primary Care Provider:  Georgina Quint Ajaya Crutchfield MD  CC:  3 mo f/u .  History of Present Illness: The patient presents for a follow up of LBP, hand OA pains, DVT Tramadol did not help  Current Medications (verified): 1)  Coumadin 6 Mg  Tabs (Warfarin Sodium) .... Take As Directed By Coumadin Clinic. 2)  Furosemide 20 Mg Tabs (Furosemide) .[name].. 1 Qam Po 3)  Proventil Hfa 108 (90 Base) Mcg/act Aers (Albuterol Sulfate) .... 2 Inh Qid As Needed 4)  Vitamin D3 1000 Unit  Tabs (Cholecalciferol) .[name].. 1 By Mouth Daily 5)  Zyrtec Allergy 10 Mg Tbdp (Cetirizine Hcl) .[name].. 1 By Mouth Once Daily Seasonal 6)  Promethazine-Codeine 6.25-10 Mg/51ml Syrp (Promethazine-Codeine) .... 5-10 Ml By Mouth Q Id As Needed Cough 7)  Lomotil 2.5-0.025 Mg Tabs (Diphenoxylate-Atropine) .[name].. 1-2 By Mouth Qid As Needed Diarrhea 8)  Omega-3 Cf 1000 Mg Caps (Omega-3 Fatty Acids) .[name].. 1 Once Daily 9)  Glucosamine 500 Mg Caps (Glucosamine Sulfate) .[name].. 1 Once Daily 10)  Niacin 500 Mg Tabs (Niacin) .[name].. 1 Once Daily 11)  B Complex  Tabs (B Complex Vitamins) .[name].. 1 Once Daily 12)  Enoxaparin Sodium 150 Mg/ml Soln (Enoxaparin Sodium) .... Inject 1 Syringe Subcutaneously Two Times A Day As Directed 13)  Tramadol Hcl 50 Mg Tabs (Tramadol Hcl) .[name].. 1-2 Tabs By Mouth Two Times A Day As Needed Pain  Allergies (verified): 1)  ! Pcn 2)  ! 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  The patient denies dyspnea on exertion, abdominal pain, and melena.         OA, LBP  Physical Exam  General:  overweight-appearing.   Nose:   Nasal mucosa are pink and moist without lesions or exudates. Mouth:  Erythematous throat mucosa and intranasal erythema.  Neck:  No deformities, masses, or tenderness noted. Lungs:  Normal respiratory effort, chest expands symmetrically. Lungs are clear to auscultation, no crackles or wheezes. Heart:  Normal rate and regular rhythm. S1 and S2 normal without gallop, murmur, click, rub or other extra sounds. Abdomen:  Bowel sounds positive,abdomen soft and non-tender without masses, organomegaly or hernias noted. Msk:  Lumbar-sacral spine is tender to palpation over paraspinal muscles and painfull with the ROM  R hip tender w/flexion and internal rotation B 1st MCPs are tender  Extremities:  trace right pedal edema.  L nl Neurologic:  No cranial nerve deficits noted.  Station and gait are normal. Plantar reflexes are down-going bilaterally. DTRs are symmetrical throughout. Sensory, motor and coordinative functions appear intact. Skin:  R distal forearm w/pigmented growth Psych:  Cognition and judgment appear intact. Alert and cooperative with normal attention span and concentration. No apparent delusions, illusions, hallucinations   Impression & Recommendations:  Problem # 1:  ANTICOAGULATION THERAPY (ICD-V58.61) Assessment Unchanged On the regimen of medicine(s) reflected in the chart    Problem # 2:  HYPERLIPIDEMIA (ICD-272.4) Assessment: Unchanged  His  updated medication list for this problem includes:    Niacin 500 Mg Tabs (Niacin) .[name]... 1 once daily  Problem # 3:  OBESITY (ICD-278.00) Assessment: Unchanged  Problem # 4:  OSTEOARTHRITIS (ICD-715.90) Assessment: Unchanged  The following medications were removed from the medication list:    Tramadol Hcl 50 Mg Tabs (Tramadol hcl) .[name]... 1-2 tabs by mouth two times a day as needed pain His updated medication list for this problem includes:    Fentanyl 25 Mcg/hr Pt72 (Fentanyl) ..... Use on skin q 3 days for pain to try Risks vs benefits and controversies of a long term controlled substances use were discussed.   Problem # 5:  LOW BACK PAIN, CHRONIC (ICD-724.2) Assessment: Unchanged  The following medications were removed from the medication list:    Tramadol Hcl 50 Mg Tabs (Tramadol hcl) .[name]... 1-2 tabs by mouth two times a day as needed pain His updated medication list for this problem includes:    Fentanyl 25 Mcg/hr Pt72 (Fentanyl) ..... Use on skin q 3 days for pain  Problem # 6:  VITAMIN B12 DEFICIENCY (ICD-266.2) Assessment: New Start SL B12  Complete Medication List: 1)  Coumadin 6 Mg Tabs (Warfarin sodium) .... Take as directed by coumadin clinic. 2)  Furosemide 20 Mg Tabs (Furosemide) .[name].. 1 qam po 3)  Proventil Hfa 108 (90 Base) Mcg/act Aers (Albuterol sulfate) .... 2 inh qid as needed 4)  Vitamin D3 1000 Unit Tabs (Cholecalciferol) .[name].. 1 by mouth daily 5)  Zyrtec Allergy 10 Mg Tbdp (Cetirizine hcl) .[name].. 1 by mouth once daily seasonal 6)  Promethazine-codeine 6.25-10 Mg/43ml Syrp (Promethazine-codeine) .... 5-10 ml by mouth q id as needed cough 7)  Lomotil 2.5-0.025 Mg Tabs (Diphenoxylate-atropine) .[name].. 1-2 by mouth qid as needed diarrhea 8)  Omega-3 Cf 1000 Mg Caps (Omega-3 fatty acids) .[name].. 1 once daily 9)  Glucosamine 500 Mg Caps (Glucosamine sulfate) .[name].. 1 once daily 10)  Niacin 500 Mg Tabs (Niacin) .[name].. 1 once daily 11)  Enoxaparin Sodium 150 Mg/ml Soln (Enoxaparin  sodium) .... Inject 1 syringe subcutaneously two times a day as directed 12)  Fentanyl 25 Mcg/hr Pt72 (Fentanyl) .... Use on skin q 3 days for pain 13)  Vitamin B-12 1000 Mcg Subl (Cyanocobalamin) .[name].. 1 by mouth qd  Patient Instructions: 1)  Please schedule a follow-up appointment in 1 month. Prescriptions: VITAMIN B-12 1000 MCG SUBL (CYANOCOBALAMIN) 1 by mouth qd  #100 x 3   Entered and Authorized by:   Tresa Garter MD   Signed by:   Tresa Garter MD on 07/23/2010   Method used:   Print then Give to Patient   RxID:   [id number] FENTANYL 25 MCG/HR PT72 (FENTANYL) use on skin q 3 days for pain  #10 x 0   Entered and Authorized by:   Tresa Garter MD   Signed by:   Tresa Garter MD on 07/23/2010   Method used:   Print then Give to Patient   RxID:  [id number]    Orders Added: 1)  Est. Patient Level IV [30865]

## 2010-08-05 ENCOUNTER — Telehealth: Payer: Self-pay | Admitting: Internal Medicine

## 2010-08-12 ENCOUNTER — Encounter: Payer: Self-pay | Admitting: Internal Medicine

## 2010-08-12 ENCOUNTER — Encounter (INDEPENDENT_AMBULATORY_CARE_PROVIDER_SITE_OTHER): Payer: BC Managed Care – PPO

## 2010-08-12 DIAGNOSIS — Z7901 Long term (current) use of anticoagulants: Secondary | ICD-10-CM

## 2010-08-12 DIAGNOSIS — I8010 Phlebitis and thrombophlebitis of unspecified femoral vein: Secondary | ICD-10-CM

## 2010-08-12 DIAGNOSIS — I2699 Other pulmonary embolism without acute cor pulmonale: Secondary | ICD-10-CM

## 2010-08-14 NOTE — Progress Notes (Signed)
Summary: Fentanl patch - Brand name?   Phone Note Call from Patient Call back at [facility] Phone [phone] Call back at [phone]   Summary of Call: Per pt, patches are not sticking. Patches are bubbling up after couple hours. Spoke w/pt - he has followed directions correctly. Pharm reccomended pt try brand name b/c there have been less problems adhering.   Pt has been on multiple meds - tramadol, hydrocodone, oxy, percocet. They either have not helped or caused abd cramping.  Initial call taken by: Lamar Sprinkles, CMA,  August 05, 2010 11:37 AM  Follow-up for Phone Call        ok brand name Thank you!  Follow-up by: Tresa Garter MD,  August 05, 2010 1:20 PM  Additional Follow-up for Phone Call Additional follow up Details #1::        Pt informed, rx ready - brand name only  Additional Follow-up by: Lamar Sprinkles, CMA,  August 05, 2010 2:11 PM    New/Updated Medications: DURAGESIC-25 25 MCG/HR PT72 (FENTANYL) one every 3 days for pain DURAGESIC-25 25 MCG/HR PT72 (FENTANYL) one every 3 days for pain Brand Name Medically Necessary [BMN] Prescriptions: DURAGESIC-25 25 MCG/HR PT72 (FENTANYL) one every 3 days for pain Brand Name Medically Necessary Brand medically necessary #10 x 0   Entered by:   Lamar Sprinkles, CMA   Authorized by:   Tresa Garter MD   Signed by:   Lamar Sprinkles, CMA on 08/05/2010   Method used:   Reprint   RxID:   [id number] DURAGESIC-25 25 MCG/HR PT72 (FENTANYL) one every 3 days for pain  #10 x 0   Entered by:   Lamar Sprinkles, CMA   Authorized by:   Tresa Garter MD   Signed by:   Lamar Sprinkles, CMA on 08/05/2010   Method used:   Print then Give to Patient   RxID:   [id number]

## 2010-08-20 NOTE — Medication Information (Signed)
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 yes       Details: changed to fentanyl on 1/31   Recent/future dental: no  Any missed doses?: no       Is patient compliant with meds? yes       Allergies: 1)  ! Pcn 2)  ! Sulfa 3)  Hydrocodone-Acetaminophen (Hydrocodone-Acetaminophen) 4)  Oxycodone Hcl  Anticoagulation Management History:      The patient is taking warfarin and comes in today for a routine follow up visit.  Negative risk factors for bleeding include an age less than 24 years old.  The bleeding index is 'low risk'.  Negative CHADS2 values include Age > 20 years old.  The start date was 10/31/2002.  His last INR was 2.7 ratio.  Anticoagulation responsible provider: Tenny Craw MD, Gunnar Fusi.  Cuvette Lot#: [id number].  Exp: 06/2011.    Anticoagulation Management Assessment/Plan:      The patient's current anticoagulation dose is Coumadin 6 mg  tabs: Take as directed by coumadin clinic..  The target INR is 2 - 3.  The next INR is due 09/09/2010.  Anticoagulation instructions were given to patient.  Results were reviewed/authorized by Weston Brass, PharmD.  He was notified by Margot Chimes PharmD Candidate .         Prior Anticoagulation Instructions: INR 2.5 (INR goal: 2-3)  Take 1 tablet everyday except 1/2 tablet on Fridays.  Recheck in 4  weeks.    Current Anticoagulation Instructions: INR 2.3  Continue current dose of 1 tablet everyday except Fridays when you only take 1/2 tablet.  Recheck INR in 4 weeks.

## 2010-08-21 ENCOUNTER — Ambulatory Visit (INDEPENDENT_AMBULATORY_CARE_PROVIDER_SITE_OTHER): Payer: BC Managed Care – PPO | Admitting: Internal Medicine

## 2010-08-21 ENCOUNTER — Encounter: Payer: Self-pay | Admitting: Internal Medicine

## 2010-08-21 DIAGNOSIS — E538 Deficiency of other specified B group vitamins: Secondary | ICD-10-CM

## 2010-08-21 DIAGNOSIS — M545 Low back pain, unspecified: Secondary | ICD-10-CM

## 2010-08-21 DIAGNOSIS — Z7901 Long term (current) use of anticoagulants: Secondary | ICD-10-CM

## 2010-08-21 DIAGNOSIS — E669 Obesity, unspecified: Secondary | ICD-10-CM

## 2010-08-29 ENCOUNTER — Telehealth: Payer: Self-pay | Admitting: Internal Medicine

## 2010-09-03 LAB — GLUCOSE, CAPILLARY: Glucose-Capillary: 110 mg/dL — ABNORMAL HIGH (ref 70–99)

## 2010-09-03 NOTE — Progress Notes (Signed)
  Phone Note Call from Patient   Summary of Call: Wife called - pain med given at last office visit is not strong enough. Can he take more? Please advise.  Initial call taken by: Lamar Sprinkles, CMA,  August 29, 2010 12:00 PM  Follow-up for Phone Call        He can switch to 12 mg qd Follow-up by: Tresa Garter MD,  August 29, 2010 5:57 PM  Additional Follow-up for Phone Call Additional follow up Details #1::        left detailed vm for patient, rx will be ready tomorrow Additional Follow-up by: Lamar Sprinkles, CMA,  August 29, 2010 6:18 PM    New/Updated Medications: EXALGO 12 MG XR24H-TAB (HYDROMORPHONE HCL) 1 by mouth qd Prescriptions: EXALGO 12 MG XR24H-TAB (HYDROMORPHONE HCL) 1 by mouth qd  #30 x 0   Entered and Authorized by:   Tresa Garter MD   Signed by:   Lamar Sprinkles, CMA on 08/29/2010   Method used:   Print then Give to Patient   RxID:   [id number]

## 2010-09-03 NOTE — Assessment & Plan Note (Signed)
Summary: 1 MO FU /NWS  #   Vital Signs:  Patient profile:   60 year old male Height:      71 inches Weight:      289 pounds BMI:     40.45 Temp:     97.9 degrees F oral Pulse rate:   80 / minute Pulse rhythm:   regular Resp:     16 per minute BP sitting:   120 / 72  (left arm) Cuff size:   large  Vitals Entered By: Lanier Prude, CMA(AAMA) (August 21, 2010 8:11 AM) CC: 1 mo f/u  Is Patient Diabetic? No Comments pt is not taking Enoxaparin. Please remove from list.  He states he cant use the Duragesic patches because they cause mood swings and do not help with pain.   Primary Care Provider:  Tresa Garter MD  CC:  1 mo f/u .  History of Present Illness: The patient presents for a follow up of SOB, OA/back pain, anxiety, depression and headaches. The patch keeps coming off and not working  Current Medications (verified): 1)  Coumadin 6 Mg  Tabs (Warfarin Sodium) .... Take As Directed By Coumadin Clinic. 2)  Furosemide 20 Mg Tabs (Furosemide) .[name].. 1 Qam Po 3)  Proventil Hfa 108 (90 Base) Mcg/act Aers (Albuterol Sulfate) .... 2 Inh Qid As Needed 4)  Vitamin D3 1000 Unit  Tabs (Cholecalciferol) .[name].. 1 By Mouth Daily 5)  Zyrtec Allergy 10 Mg Tbdp (Cetirizine Hcl) .[name].. 1 By Mouth Once Daily Seasonal 6)  Promethazine-Codeine 6.25-10 Mg/76ml Syrp (Promethazine-Codeine) .... 5-10 Ml By Mouth Q Id As Needed Cough 7)  Lomotil 2.5-0.025 Mg Tabs (Diphenoxylate-Atropine) .[name].. 1-2 By Mouth Qid As Needed Diarrhea 8)  Omega-3 Cf 1000 Mg Caps (Omega-3 Fatty Acids) .[name].. 1 Once Daily 9)  Glucosamine 500 Mg Caps (Glucosamine Sulfate) .[name].. 1 Once Daily 10)  Niacin 500 Mg Tabs (Niacin) .[name].. 1 Once Daily 11)  Enoxaparin Sodium 150 Mg/ml Soln (Enoxaparin Sodium) .... Inject 1 Syringe Subcutaneously Two Times A Day As Directed 12)  Duragesic-25 25 Mcg/hr Pt72 (Fentanyl) .... One Every 3 Days For Pain Brand Name Medically Necessary 13)  Vitamin B-12 1000 Mcg Subl (Cyanocobalamin) .[name].. 1 By  Mouth Qd  Allergies (verified): 1)  ! Pcn 2)  ! 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         lost wt on diet  Physical Exam  General:  overweight-appearing.   Eyes:  No corneal or conjunctival inflammation noted. EOMI. Perrla. Nose:   Nasal mucosa are pink and moist without lesions or exudates. Mouth:  Erythematous throat mucosa and intranasal erythema.  Neck:  No deformities, masses, or tenderness noted. Lungs:  Normal respiratory effort, chest expands symmetrically. Lungs are clear to auscultation, no crackles or wheezes. Heart:  Normal rate and regular rhythm. S1 and S2 normal without gallop, murmur, click, rub or other extra sounds. Abdomen:  Bowel sounds positive,abdomen soft and non-tender without masses, organomegaly  or hernias noted. Msk:  Lumbar-sacral spine is tender to palpation over paraspinal muscles and painfull with the ROM  R hip tender w/flexion and internal rotation B 1st MCPs are tender  Extremities:  trace right pedal edema.  L nl Neurologic:  No cranial nerve deficits noted. Station and gait are normal. Plantar reflexes are down-going bilaterally. DTRs are symmetrical throughout. Sensory, motor and coordinative functions appear intact. Skin:  R distal forearm w/pigmented growth Psych:  Cognition  and judgment appear intact. Alert and cooperative with normal attention span and concentration. No apparent delusions, illusions, hallucinations   Impression & Recommendations:  Problem # 1:  LOW BACK PAIN, CHRONIC (ICD-724.2) Assessment Unchanged Lost wt on diet The following medications were removed from the medication list:    Duragesic-25 25 Mcg/hr Pt72 (Fentanyl) ..... One every 3 days for pain brand name medically necessary - leave it on x 6 d, then d/c His updated medication list for this problem includes:    Exalgo 8 Mg Xr24h-tab (Hydromorphone hcl) .[name]... 1 by mouth qd  Problem # 2:  VITAMIN B12 DEFICIENCY (ICD-266.2) Assessment: Unchanged On the regimen of medicine(s) reflected in the chart    Problem # 3:  PARESTHESIA (ICD-782.0) Assessment: Unchanged  Problem # 4:  PULMONARY EMBOLISM, HX OF (ICD-V12.51) Assessment: Unchanged  His updated medication list for this problem includes:    Coumadin 6 Mg Tabs (Warfarin sodium) .[name]... Take as directed by coumadin clinic.    Enoxaparin Sodium 150 Mg/ml Soln (Enoxaparin sodium) ..... Inject 1 syringe subcutaneously two times a day as directed  Complete Medication List: 1)  Coumadin 6 Mg Tabs (Warfarin sodium) .... Take as directed by coumadin clinic. 2)  Furosemide 20 Mg Tabs (Furosemide) .[name].. 1 qam po 3)  Proventil Hfa 108 (90 Base) Mcg/act Aers (Albuterol sulfate) .... 2 inh qid as needed 4)  Vitamin D3 1000 Unit Tabs (Cholecalciferol) .[name].. 1 by mouth daily 5)  Zyrtec Allergy 10 Mg Tbdp (Cetirizine hcl) .[name].. 1 by mouth once daily seasonal 6)  Lomotil 2.5-0.025 Mg Tabs (Diphenoxylate-atropine) .[name].. 1-2 by mouth qid as needed diarrhea 7)  Omega-3 Cf 1000 Mg Caps (Omega-3 fatty acids) .[name].. 1 once daily 8)  Glucosamine 500 Mg Caps (Glucosamine sulfate) .[name].. 1 once daily 9)  Niacin 500 Mg Tabs (Niacin) .[name].. 1 once daily 10)  Enoxaparin Sodium 150 Mg/ml Soln (Enoxaparin sodium) .... Inject 1 syringe subcutaneously two times a day as  directed 11)  Vitamin B-12 1000 Mcg Subl (Cyanocobalamin) .[name].. 1 by mouth qd 12)  Gabapentin 300 Mg Caps (Gabapentin) .[name].. 1 by mouth bid 13)  Exalgo 8 Mg Xr24h-tab (Hydromorphone hcl) .[name].. 1 by mouth qd  Patient Instructions: 1)  Please schedule a follow-up appointment in 1 month. Prescriptions: EXALGO 8 MG XR24H-TAB (HYDROMORPHONE HCL) 1 by mouth qd  #30 x 0   Entered and Authorized by:   Tresa Garter MD   Signed by:   Tresa Garter MD on 08/21/2010   Method used:   Print then Give to Patient   RxID:   [id number]    Orders Added: 1)  Est. Patient Level IV [14782]

## 2010-09-04 ENCOUNTER — Encounter: Payer: Self-pay | Admitting: Internal Medicine

## 2010-09-04 DIAGNOSIS — I2699 Other pulmonary embolism without acute cor pulmonale: Secondary | ICD-10-CM

## 2010-09-04 DIAGNOSIS — I80299 Phlebitis and thrombophlebitis of other deep vessels of unspecified lower extremity: Secondary | ICD-10-CM

## 2010-09-04 DIAGNOSIS — Z86711 Personal history of pulmonary embolism: Secondary | ICD-10-CM | POA: Insufficient documentation

## 2010-09-04 DIAGNOSIS — Z7901 Long term (current) use of anticoagulants: Secondary | ICD-10-CM | POA: Insufficient documentation

## 2010-09-09 ENCOUNTER — Encounter: Payer: Self-pay | Admitting: Cardiology

## 2010-09-09 ENCOUNTER — Encounter (INDEPENDENT_AMBULATORY_CARE_PROVIDER_SITE_OTHER): Payer: BC Managed Care – PPO

## 2010-09-09 DIAGNOSIS — I80299 Phlebitis and thrombophlebitis of other deep vessels of unspecified lower extremity: Secondary | ICD-10-CM

## 2010-09-09 DIAGNOSIS — Z7901 Long term (current) use of anticoagulants: Secondary | ICD-10-CM

## 2010-09-09 DIAGNOSIS — I2699 Other pulmonary embolism without acute cor pulmonale: Secondary | ICD-10-CM

## 2010-09-09 LAB — CONVERTED CEMR LAB: POC INR: 3

## 2010-09-16 ENCOUNTER — Telehealth: Payer: Self-pay | Admitting: *Deleted

## 2010-09-16 NOTE — Telephone Encounter (Signed)
Ok Oxycodone 5 mg qd or bid #60 - no ref Thank you!

## 2010-09-16 NOTE — Telephone Encounter (Signed)
Pt left vm - he feels that morphine has made him worse and would like to stop med. Please advise.

## 2010-09-16 NOTE — Telephone Encounter (Signed)
SPoke w/pt, Advised him to start back on lower dose of Exalgo - currently on 12 qd will go back to 8 until he hears further from MD. Pt described being on multiple pain meds in the past. He states that Oxycontin 5mg  one qd (must have been XR?) worked the best for him. Would this be an option?

## 2010-09-17 MED ORDER — OXYCODONE HCL 5 MG PO TABS: 5.0000 mg | ORAL_TABLET | Freq: Two times a day (BID) | ORAL | Status: DC

## 2010-09-17 NOTE — Telephone Encounter (Signed)
Patient informed. 

## 2010-09-19 NOTE — Medication Information (Signed)
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 no    Recent/future dental: no  Any missed doses?: no       Is patient compliant with meds? yes       Current Medications (verified): 1)  Coumadin 6 Mg  Tabs (Warfarin Sodium) .... Take As Directed By [facility]madin Clinic. 2)  Furosemide 20 Mg Tabs (Furosemide) .[name].. 1 Qam Po 3)  Proventil Hfa 108 (90 Base) Mcg/act Aers (Albuterol Sulfate) .... 2 Inh Qid As Needed 4)  Vitamin D3 1000 Unit  Tabs (Cholecalciferol) .[name].. 1 By Mouth Daily 5)  Zyrtec Allergy 10 Mg Tbdp (Cetirizine Hcl) .[name].. 1 By Mouth Once Daily Seasonal 6)  Lomotil 2.5-0.025 Mg Tabs (Diphenoxylate-Atropine) .[name].. 1-2 By Mouth Qid As Needed Diarrhea 7)  Omega-3 Cf 1000 Mg Caps (Omega-3 Fatty Acids) .[name].. 1 Once Daily 8)  Glucosamine 500 Mg Caps (Glucosamine Sulfate) .[name].. 1 Once Daily 9)  Niacin 500 Mg Tabs (Niacin) .[name].. 1 Once Daily 10)  Enoxaparin Sodium 150 Mg/ml Soln (Enoxaparin Sodium) .... Inject 1 Syringe Subcutaneously Two Times A Day As Directed 11)  Vitamin B-12 1000 Mcg Subl (Cyanocobalamin) .[name].. 1 By Mouth Qd 12)  Gabapentin 300 Mg Caps (Gabapentin) .[name].. 1 By Mouth Bid 13)  Exalgo 12 Mg Xr24h-Tab (Hydromorphone Hcl) .[name].. 1 By Mouth Qd  Allergies (verified): 1)  ! Pcn 2)  ! Sulfa 3)  Hydrocodone-Acetaminophen (Hydrocodone-Acetaminophen) 4)  Oxycodone Hcl 5)  Duragesic-25 (Fentanyl) 6)  Tramadol Hcl  Anticoagulation Management History:      Negative risk factors for bleeding include an age less than  77 years old.  The bleeding index is 'low risk'.  Negative CHADS2 values include Age > 53 years old.  The start date was 10/31/2002.  His last INR was 2.7 ratio.  Anticoagulation responsible provider: Jens Som MD, Arlys John.  INR POC: 3.  Exp: 06/2011.    Anticoagulation Management Assessment/Plan:      The patient's current anticoagulation dose is Coumadin 6 mg  tabs: Take as directed by coumadin clinic..  The target INR is 2 - 3.  The next INR is due 10/07/2010.  Anticoagulation instructions were given to patient.  Results were reviewed/authorized by Samantha Crimes, PharmD.         Prior Anticoagulation Instructions: INR 2.3  Continue current dose of 1 tablet everyday except Fridays when you only take 1/2 tablet.  Recheck INR in 4 weeks.    Current Anticoagulation Instructions: Return to clinic in 4 weeks on 4/16 @ 8 am Cont with current regimen

## 2010-09-25 ENCOUNTER — Ambulatory Visit (INDEPENDENT_AMBULATORY_CARE_PROVIDER_SITE_OTHER): Payer: BC Managed Care – PPO | Admitting: Internal Medicine

## 2010-09-25 ENCOUNTER — Encounter: Payer: Self-pay | Admitting: Internal Medicine

## 2010-09-25 DIAGNOSIS — M545 Low back pain, unspecified: Secondary | ICD-10-CM

## 2010-09-25 DIAGNOSIS — M1810 Unilateral primary osteoarthritis of first carpometacarpal joint, unspecified hand: Secondary | ICD-10-CM | POA: Insufficient documentation

## 2010-09-25 DIAGNOSIS — E538 Deficiency of other specified B group vitamins: Secondary | ICD-10-CM

## 2010-09-25 DIAGNOSIS — M19049 Primary osteoarthritis, unspecified hand: Secondary | ICD-10-CM

## 2010-09-25 DIAGNOSIS — I2699 Other pulmonary embolism without acute cor pulmonale: Secondary | ICD-10-CM

## 2010-09-25 DIAGNOSIS — R079 Chest pain, unspecified: Secondary | ICD-10-CM | POA: Insufficient documentation

## 2010-09-25 MED ORDER — NORTRIPTYLINE HCL 10 MG PO CAPS: 10.0000 mg | ORAL_CAPSULE | Freq: Every day | ORAL | Status: DC

## 2010-09-25 NOTE — Assessment & Plan Note (Addendum)
Some better on Oxycodone

## 2010-09-25 NOTE — Assessment & Plan Note (Signed)
On Rx 

## 2010-09-25 NOTE — Assessment & Plan Note (Signed)
On Coumadin 

## 2010-09-25 NOTE — Assessment & Plan Note (Signed)
We will try Nortryptiline at hs

## 2010-09-25 NOTE — Patient Instructions (Signed)
You can try Vimovo 1 once or twice a day OR Celebrex 200 mg 1-2 a day with food for pain - you can use either for a few days and then take break. You can try Lidoderm too.

## 2010-09-25 NOTE — Assessment & Plan Note (Addendum)
He is refusing surgery and/or injections F/u with Dr Jillyn Hidden We will try NSAIDs with caution (see instructions)

## 2010-09-25 NOTE — Progress Notes (Signed)
  Subjective:    Patient ID: Robert [name], male    DOB: July 26, 1950, 60 y.o.   MRN: [id number]  HPI  F/u CP, LBP, Myalgias Exalgo had to be stopped - nausea. Now on Oxycodone - it is helping with CP and back pain to 0/10 C/o pain in B thumbs is not helped and is very severe. He used Gabapentin but stopped it due to lack  F/u abn CT  (repeat CT is pending)   Review of Systems  Constitutional: Positive for diaphoresis and fatigue. Negative for fever, activity change and appetite change.  HENT: Negative for congestion, facial swelling and sneezing.   Eyes: Negative for pain.  Respiratory: Positive for shortness of breath. Negative for apnea and cough.   Cardiovascular: Positive for chest pain, palpitations and leg swelling.  Gastrointestinal: Negative for vomiting, blood in stool and abdominal distention.  Genitourinary: Negative for urgency and difficulty urinating.  Musculoskeletal: Positive for back pain, joint swelling, arthralgias and gait problem. Negative for myalgias.       B 1st MCPs are tender  Skin: Negative for rash.  Neurological: Negative for tremors and weakness.  Hematological: Does not bruise/bleed easily.  Psychiatric/Behavioral: Negative for suicidal ideas and dysphoric mood. The patient is not nervous/anxious.        Objective:   Physical Exam  Constitutional: He is oriented to person, place, and time. He appears well-developed. No distress.       Overweight  HENT:  Head: Normocephalic.  Nose: Nose normal.  Eyes: Pupils are equal, round, and reactive to light. Left eye exhibits no discharge.  Neck: No JVD present. No thyromegaly present.  Cardiovascular: Normal rate and regular rhythm.  Exam reveals no gallop and no friction rub.   No murmur heard. Pulmonary/Chest: Effort normal. No respiratory distress. He has no wheezes. He has no rales. He exhibits no tenderness.  Abdominal: Soft. He exhibits no distension. There is no tenderness.  Musculoskeletal: He  exhibits edema (R LE trace) and tenderness.       B 1st MCPs are tender  Neurological: He is oriented to person, place, and time. Coordination normal.  Skin: No rash noted. He is not diaphoretic.  Psychiatric: Judgment normal.       Sad        Wt Readings from Last 3 Encounters:  09/25/10 279 lb (126.554 kg)  08/21/10 289 lb (131.09 kg)  07/23/10 302 lb (136.986 kg)     Assessment & Plan:  Osteoarthritis of thumb B sides He is refusing surgery and/or injections F/u with Dr Jillyn Hidden We will try NSAIDs with caution (see instructions)  LOW BACK PAIN Some better on Oxycodone  Chest pain syndrome We will try Nortryptiline at hs   Pulmonary embolism On Coumadin  VITAMIN B12 DEFICIENCY On Rx

## 2010-10-07 ENCOUNTER — Ambulatory Visit (INDEPENDENT_AMBULATORY_CARE_PROVIDER_SITE_OTHER): Payer: BC Managed Care – PPO | Admitting: *Deleted

## 2010-10-07 DIAGNOSIS — I2699 Other pulmonary embolism without acute cor pulmonale: Secondary | ICD-10-CM

## 2010-10-07 DIAGNOSIS — I80299 Phlebitis and thrombophlebitis of other deep vessels of unspecified lower extremity: Secondary | ICD-10-CM

## 2010-10-07 LAB — POCT INR: INR: 2.7

## 2010-11-04 ENCOUNTER — Ambulatory Visit (INDEPENDENT_AMBULATORY_CARE_PROVIDER_SITE_OTHER): Payer: BC Managed Care – PPO | Admitting: *Deleted

## 2010-11-04 DIAGNOSIS — Z7901 Long term (current) use of anticoagulants: Secondary | ICD-10-CM

## 2010-11-04 DIAGNOSIS — I80299 Phlebitis and thrombophlebitis of other deep vessels of unspecified lower extremity: Secondary | ICD-10-CM

## 2010-11-04 DIAGNOSIS — I2699 Other pulmonary embolism without acute cor pulmonale: Secondary | ICD-10-CM

## 2010-11-04 LAB — POCT INR: INR: 2.6

## 2010-11-05 ENCOUNTER — Encounter: Payer: Self-pay | Admitting: Internal Medicine

## 2010-11-07 ENCOUNTER — Ambulatory Visit (INDEPENDENT_AMBULATORY_CARE_PROVIDER_SITE_OTHER): Payer: BC Managed Care – PPO | Admitting: Internal Medicine

## 2010-11-07 ENCOUNTER — Encounter: Payer: Self-pay | Admitting: Internal Medicine

## 2010-11-07 DIAGNOSIS — M19049 Primary osteoarthritis, unspecified hand: Secondary | ICD-10-CM

## 2010-11-07 DIAGNOSIS — I2699 Other pulmonary embolism without acute cor pulmonale: Secondary | ICD-10-CM

## 2010-11-07 DIAGNOSIS — R635 Abnormal weight gain: Secondary | ICD-10-CM

## 2010-11-07 DIAGNOSIS — Z7901 Long term (current) use of anticoagulants: Secondary | ICD-10-CM

## 2010-11-07 DIAGNOSIS — M545 Low back pain, unspecified: Secondary | ICD-10-CM

## 2010-11-07 DIAGNOSIS — M1810 Unilateral primary osteoarthritis of first carpometacarpal joint, unspecified hand: Secondary | ICD-10-CM

## 2010-11-07 DIAGNOSIS — E538 Deficiency of other specified B group vitamins: Secondary | ICD-10-CM

## 2010-11-07 MED ORDER — OXYCODONE HCL 5 MG PO TABS: 5.0000 mg | ORAL_TABLET | Freq: Two times a day (BID) | ORAL | Status: DC

## 2010-11-07 NOTE — Progress Notes (Signed)
  Subjective:    Patient ID: Robert [name], male    DOB: 05/04/1951, 60 y.o.   MRN: [id number]  HPI   The patient is here to follow up on chronic depression, anxiety, headaches and chronic moderate LBP and thumbs pain symptoms controlled with medicines, diet and exercise. He had to stop Celebrex due to swelling and SOB.    Review of Systems  Constitutional: Negative for appetite change.  HENT: Positive for nosebleeds. Negative for neck pain.   Eyes: Negative for pain.  Respiratory: Negative for choking and stridor.   Cardiovascular: Positive for leg swelling. Negative for palpitations.  Gastrointestinal: Negative for abdominal distention.  Genitourinary: Negative for scrotal swelling and enuresis.  Musculoskeletal: Negative for joint swelling.  Neurological: Negative for numbness.  Hematological: Negative for adenopathy.  Psychiatric/Behavioral: Negative for confusion. The patient is not nervous/anxious.        Objective:   Physical Exam  Constitutional: He is oriented to person, place, and time. He appears well-developed.  HENT:  Mouth/Throat: Oropharynx is clear and moist.  Eyes: Conjunctivae are normal. Pupils are equal, round, and reactive to light.  Neck: Normal range of motion. No JVD present. No thyromegaly present.  Cardiovascular: Normal rate, regular rhythm, normal heart sounds and intact distal pulses.  Exam reveals no gallop and no friction rub.   No murmur heard. Pulmonary/Chest: Effort normal and breath sounds normal. No respiratory distress. He has no wheezes. He has no rales. He exhibits no tenderness.  Abdominal: Soft. Bowel sounds are normal. He exhibits no distension and no mass. There is no tenderness. There is no rebound and no guarding.  Musculoskeletal: Normal range of motion. He exhibits no edema and no tenderness.  Lymphadenopathy:    He has no cervical adenopathy.  Neurological: He is alert and oriented to person, place, and time. He has normal  reflexes. No cranial nerve deficit. He exhibits normal muscle tone. Coordination normal.  Skin: Skin is warm and dry. No rash noted.  Psychiatric: He has a normal mood and affect. His behavior is normal. Judgment and thought content normal.        Wt Readings from Last 3 Encounters:  11/07/10 282 lb (127.914 kg)  09/25/10 279 lb (126.554 kg)  08/21/10 289 lb (131.09 kg)     Assessment & Plan:   VITAMIN B12 DEFICIENCY On Rx  Pulmonary embolism On coumadin  Osteoarthritis of thumb Pain is better  Long term current use of anticoagulant On Rx  LOW BACK PAIN On Rx

## 2010-11-08 NOTE — H&P (Signed)
[facility]  Patient:    Robert [name], COURY Visit Number: [id number] MRN: [id number]          Service Type: Attending:  Javier Docker, M.D. Dictated by:   Dorie Rank, P.A. Adm. Date:  12/08/01   CC:         Soyla Murphy. Renne Crigler, M.D.   History and Physical  DATE OF BIRTH:  01/27/1951  CHIEF COMPLAINT:  Low back pain.  HISTORY OF PRESENT ILLNESS:  Robert [name] is a 60 year old male who has had low back pain for quite some time now despite conservative treatment, including activity modification and nonsteroidals.  He had an MRI on Nov 03, 2001, which revealed changes at L5-S1, advanced disk degeneration at L5-S1 with neuroforaminal stenosis and question of L5 nerve root on the left and L5 foramen.  It was felt due to his ongoing pain and interference with activities of daily living and his job, he would benefit from undergoing decompression L5-S1, posterior lumbar interbody fusion, posterior lateral foraminotomies, L5-S1 with pedicle screws, local and allograft bone grafting.  The risks and benefits as well as the procedure were discussed with the patient and he elected to proceed.  ALLERGIES:  PENICILLIN causes a rash, SULFA - rash, CELEBREX/VIOXX - rash.  MEDICATIONS: 1. Q-Bid two p.o. b.i.d. 2. Fish oil t.i.d. 3. Iron q.d. 4. Glucosamine with chondroitin two p.o. q.d.  PAST MEDICAL HISTORY: 1. Significant for degenerative disk disease. 2. History of an enlarged left ventricle. 3. He underwent an echo in January of this year by his primary care physician.    It was felt like it was stable per the patient. 4. He also has a history of occasional anxiety and depression secondary to    back pain.  He was never started on any medications for this. 5. He has a history of epididymitis.  SOCIAL HISTORY:  The patient is married.  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  He denies any alcohol or tobacco use.  He has three children.  His wife  and mother-in-law will be available for care after surgery.  He lives in a one-level house.  FAMILY HISTORY:  Father deceased, age 51, history of Parkinsons disease, hypertension, hyperlipidemia.  Mother living, age 71, history of hypertension and spinal stenosis.  PAST SURGICAL HISTORY: 1. Significant for hemorrhoidectomy in 1981. 2. Left knee arthroscopy in 1988. 3. Cholecystectomy. 4. Appendectomy in 1992. 5. Right knee arthroscopy in 1993 and 2003. 6. He has undergone three surgeries to his right testicle with subsequent    orchiectomy. 7. Lumbar diskectomy at L5-S1 in 2000.  REVIEW OF SYMPTOMS:  GENERAL:  Positive for recent cough and congestion with mild fevers.  The patient is going to schedule appointment with his primary care physician, Dr. Renne Crigler, for evaluation and treatment.  PULMONARY:  Positive for productive cough, no hemoptysis.  CARDIOVASCULAR:  No chest pain, angina, or orthopnea.  GASTROINTESTINAL:  Occasional diarrhea, no melena, constipation, or hematemesis.  GENITOURINARY:  No hematuria, dysuria, or discharge.  NEUROLOGIC:  No seizures, headaches, or paralysis.  ENDOCRINE:  No history of hypo or hyperthyroidism.  No history of diabetes mellitus.  PHYSICAL EXAMINATION:  GENERAL:  Alert and oriented, mildly obese, 60 year old male.  VITAL SIGNS:  Pulse 80, respirations 20, blood pressure 120/80.  HEENT:  Head is atraumatic, normocephalic.  Oropharynx is clear.  NECK:  Supple.  There is some post nasal drainage noted to his posterior oropharynx.  No cervical lymphadenopathy palpated on exam.  CHEST:  Clear to auscultation bilaterally.  No wheezes, rhonchi, or rales.  BREASTS:  Not pertinent to present illness.  HEART:  S1, S2 negative for murmur, rub, or gallop.  Heart regular rate and rhythm.  ABDOMEN:  Soft, nontender.  Positive bowel sounds.  GENITOURINARY:  Not pertinent to present illness.  EXTREMITIES:  He walks with an antalgic gait.  Straight  leg raise produces buttocks and posterior thigh pain on the left, negative on the right.  EHL is 4+ to 5-/5 on the left in comparison to the right.  Normoreflexic.  Tender over the L5 junction on palpation.  SKIN:  Intact.  No rashes or lesions appreciated on exam.  LABORATORY AND X-RAYS:  MRI reveals advanced degenerative disk disease, L5-S1 with neuroforaminal stenosis, encroachment on the L5 nerve root on the left.  IMPRESSION:  Degenerative disk disease, spinal stenosis, L5-S1.  PLAN:  The patient is scheduled for L5-S1 decompression, posterior lumbar interbody fusion, L5-S1 with local and allograft bone graft. Dictated by:   Dorie Rank, P.A. Attending:  Javier Docker, M.D. DD:  11/30/01 TD:  12/01/01 Job: [id number]

## 2010-11-08 NOTE — H&P (Signed)
[place]  Patient:    Robert [name], Robert [name] Visit Number: [id number] MRN: [id number]          Service Type: MED Location: [id number] Attending Physician:  Syliva Overman Dictated by:   Lemmie Evens, M.D. Admit Date:  12/27/2001                           History and Physical  HISTORY OF PRESENT ILLNESS:  The patient is a 60 year old white male patient of Dr. Soyla Murphy. Pharr, who came to the emergency room today with a positive venous Doppler study of the right lower extremity.  The patient did see Dr. Javier Docker today in followup of a lumbar disk surgery which was performed about two weeks ago.  Dr. Shelle Iron noted that the patient had evidence of swelling of the right calf and thigh.  The Doppler study did demonstrate findings consistent with a deep venous thrombosis in the calf and femoral venous circulation.  The patient did undergo a lumbar disk surgery about two weeks ago.  It did not appear that he was placed on postoperative anticoagulation, according to the patient.  The patient noted pain in the calf and thigh during the last 24 hours.  The patients wife noted that the right leg was quite swollen when he came out of the shower.  The patient has had a mild cough without shortness of breath or chest pain. Also, he denies skin rash, mouth sores, eye problem, abdominal discomfort, fever, hemoptysis, dysuria or hematuria.  PAST MEDICAL HISTORY:  The patient does have a past history of mild hypertension; he does control this with diet control.  Also, he has been treated for musculoskeletal problems with the lower extremity.  He did undergo bilateral knee arthroscopic surgeries in the past.  In one operative procedure in 1993 involving the right knee, it was noted that he did have right calf pain, although a venogram was normal, according to the patient.  Also, the patient has undergone lumbar disk surgery in 2000 without  postoperative complications.  ALLERGIES:  The patient does have an allergic history to both PENICILLIN and SULFA DRUGS.  SOCIAL HISTORY:  The patient does not smoke cigarettes or drink alcohol.  PHYSICAL EXAMINATION:  GENERAL:  The patient appears to be healthy and well-nourished.  EXTREMITIES:  Examination of the extremities reveals good hand grips bilaterally.  Wrist, elbow and shoulder exam appeared normal.  Knee exam reveals good flexion and extension of the knee.  Ankle and foot exam revealed good mobility.  There is definite swelling of the right calf as well as right thigh.  There is pain on hyperflexion of the right foot noted in the calf. Pulses were detected at the dorsalis pedis and posterior tibial as well as popliteal regions.  SKIN:  Exam revealed no definite rash on the trunk, face or extremity.  ABDOMEN:  Exam revealed a soft abdomen without hepatosplenomegaly.  HEART:  Exam revealed a regular sinus rhythm, heart rate of about 90 to 100. No murmur, gallop or rub were appreciated.  LUNGS:  Exam reveals clear breath sounds bilaterally without rales.  ASSESSMENT:  The patient is two weeks postoperative lumbar disk surgery.  He did develop recent-onset swelling of the right thigh and calf with Doppler findings consistent with deep venous thrombosis, proximally and distally.  A CT scan of the lungs in the emergency room revealed bilateral pulmonary emboli as well as a  saddle embolism.  The patient is being admitted to the intensive care unit for observation as well as heparin anticoagulation therapy.  It is expected that he will be seen by pulmonary specialists as well. Dictated by:   Lemmie Evens, M.D. Attending Physician:  Syliva Overman DD:  12/28/01 TD:  12/30/01 Job: [id number]

## 2010-11-08 NOTE — Op Note (Signed)
[place]  Patient:    ISSAIAH, SEABROOKS Visit Number: [id number] MRN: [id number]          Service Type: DSU Location: [facility] Attending Physician:  Marlowe Kays Page Dictated by:   Illene Labrador. Aplington, M.D. Proc. Date: 09/15/01 Admit Date:  09/15/2001 Discharge Date: 09/15/2001                             Operative Report  PREOPERATIVE DIAGNOSIS:  Torn medial meniscus right knee.  POSTOPERATIVE DIAGNOSES: 1. Torn medial and lateral menisci. 2. 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  OPERATION: 1. Right knee arthroscopy with partial medial meniscectomy. 2. Shaving of medial femoral condyle.  SURGEON:  Illene Labrador. Aplington, M.D.  ASSISTANT:  Nurse.  ANESTHESIA:  General.  INDICATIONS FOR PROCEDURE:  He has a history of prior medial meniscectomy by me. He has had persistent pain in the knee medially, particularly with twisting type activities consistent with a recurrent tear of the medial meniscus. On plain x-rays, he has some slight medial compartment narrowing. An MRI has demonstrated probable recurrent medial meniscus tear and consequently he is here today for the above mentioned surgery.  PROCEDURE:  After satisfactory general anesthesia and pneumatic tourniquet, the body stabilizer, the right knee was prepped with Duraprep and draped in the sterile fields. Superior medial saline inflow. Posterior and anterior medial portal lateral compartment knee joint was evaluated. He was found even though the MRI did not show it to have a very serrated tear of the inner border of the mid 3rd of the lateral meniscus. This was associated with some mild chondromalacia of 1-2 of the lateral femoral condyle and lateral tibial plateau. I shaved the lateral meniscus down to a smooth with a 3.5 shaver. His ACL was intact. I then tried to get up in the lateral gutter and was  unable to because of the previous scar, I then reversed portals. Looking medially he had a large amount of cartilaginous debris in the joint as I had found laterally. Most of this seemed to be taking origin from the medial femoral condyle which I pictured and shaved down until smooth. He also had some scarring of the anterior 3rd of the medial meniscus which I shaved down. Looking posteriorly at a substantial tear of the posterior curve as well as the inner columnar areas which I resected back to stable rim was a variety of baskets and shaved down until smooth with a 3.5 shaver. I then tried once again to get up into the suprapatellar area and was unable to, and actually bent the arthroscopic cannula so that it was not serviceable. I was then able to use it to go through an alternative portal. He had no patellofemoral symptoms, however. The knee joint was then irrigated to a clear and all fluid possible removed the 2 anterior portals and closes with 4-0 nylon, 20 cc 0.5% Marcaine with adrenalin and 4 mg of morphine were then distilled through the inflow apparatus which was removed from this portal and closed with 4-0 nylon as well. Bleeding __________, pressure dressing applied. Tourniquet was released. The patient tolerated the procedure well and was taken to the recovery room in satisfaction condition with no known complications. Dictated by:   Illene Labrador. Aplington, M.D. Attending Physician:  Joaquin Courts DD:  09/15/01 TD:  09/16/01 Job: [id number]

## 2010-11-08 NOTE — Discharge Summary (Signed)
NAME:  Robert [name], Robert [name]                       ACCOUNT NO.:  [id number]   MEDICAL RECORD NO.:  [id number]                   PATIENT TYPE:  INP   LOCATION:  5522                                 FACILITY:  [facility]   PHYSICIAN:  Soyla Murphy. Renne Crigler, M.D.               DATE OF BIRTH:  [birth year]   DATE OF ADMISSION:  12/27/2001  DATE OF DISCHARGE:  01/04/2002                                 DISCHARGE SUMMARY   LABORATORY DATA:  Venous evaluation:  Right/left DVT. Echocardiogram:  Overall left ventricular systolic function normal, left atrial size at upper  limits of normal, right ventricle mildly dilated. EKG:  Normal sinus rhythm,  normal EKG. CT head:  No evidence of intracranial pathology. Laboratories:  Initial white count 10.9 with hemoglobin 15.0-by 9/14 the hemoglobin was  13.3, platelet count was normal. By 9/15, INR was 2.1. Initial electrolytes  were normal; glucose 127, BUN 12, creatinine 0.9.   HOSPITAL COURSE:  Please see admission history and physical for details of  Mr. Hauser's presentation. Briefly, he was admitted by Dr. Jimmy Footman two  weeks after lumbar disk surgery with recent onset of right thigh and calf  swelling. He was noted to have a DVT. He was seen in consultation by the  pulmonary/critical care team. They noted that he had left DVT and bilateral  pulmonary emboli. They felt that thrombolysis was not indicated unless he  became hemodynamically unstable. He was treated with IV heparin.   Other problems included obesity and some symptoms consistent with  obstructive sleep apnea. The recommendation was to stay on Coumadin for a  total of six months.   Also during the hospital stay, he was noted to have both depression and  anxiety. Zoloft was started for this. On 7/9, he had a new headache while on  IV heparin; therefore, CT of the head was performed. This showed no evidence  of intracranial bleed. He was also seen in consultation by neurology. The  headache  improved with treatment with Midrin. It was felt likely to be a  migraine. By 9/14, he was felt ready to go home even though he is still  having significant swelling in his leg.   IMPRESSION:  1. Deep vein thrombosis with pulmonary embolus.  2. Migraine headache.  3. Major depression with anxious features.  4. Allergies to penicillin, sulfa, Vioxx-rash; intolerance to erythromycin-     nausea.  5. History of hemorrhoidectomy 1981, cholecystectomy, appendectomy.  6. History of right orchiectomy for chronic epididymitis.  7. 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  8. History of right knee arthroscopy for torn medial meniscus 3/03.  9. Status post left knee arthroscopy 1988.  10.      History of elevated blood pressure, controlled with dietary     measures.  11.      Possible obstructive sleep apnea.  DISPOSITION:  He is discharged to home on the following medications:  1. Coumadin 6 mg p.o. each morning.  2. Phenergan 12.5 mg three times a day as needed.  3. Tylox one to two four times a day as needed for pain.  4. Zoloft 50 mg each morning.   No Mobic, Motrin, or aspirin. He is on the standard Coumadin diet and will  see Dr. Jason Fila on 01/05/02 for followup on his protime. Dr. Renne Crigler will see him  in three and a half weeks.                                               Soyla Murphy. Renne Crigler, M.D.    WDP/MEDQ  D:  04/18/2002  T:  04/19/2002  Job:  [id number]

## 2010-11-08 NOTE — Consult Note (Signed)
[place]  Patient:    Robert [name], Robert [name] Visit Number: [id number] MRN: [id number]          Service Type: MED Location: [phone] Attending Physician:  Syliva Overman Dictated by:   Marlan Palau, M.D. Proc. Date: 12/29/01 Admit Date:  12/27/2001   CC:         Awanda Mink, M.D.  [facility] [address]   Consultation Report  HISTORY OF PRESENT ILLNESS: Robert [name]. 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 The patient is being admitted for evaluation and treatment of this and is currently on Heparin. Last evening, this patient developed a generalized headache that came on from no pain to significant pain over five to ten minutes. This patient then developed more of a focal left occipital headache, associated with some dizziness. This patient has not complained of any numbness or weakness of the face, arms, or legs, loss of consciousness, confusion, vision changes, speech changes, or swallowing problems. The patient denies any neck stiffness. The patient has undergone a CT scan of the head. It appears to be completely normal by my review. No subarachnoid hemorrhage or focal brain disease is seen. Neurology has been asked to see this patient for further evaluation. The patient has been given Midrin today that has completely eliminated his headache. The patient is feeling normal at this point.  PAST MEDICAL HISTORY: History of hypertension, mild obesity, degenerative arthritis, history of disc herniation L5-S1 level status post surgery December 08, 2001. Has history of knee arthroscopy bilaterally, history of epididymitis, status post right orchiectomy, history of hemorrhoidectomy, history of cholecystectomy, history of appendectomy, anxiety and depression.  CURRENT MEDICATIONS: Coumadin therapy, Zoloft 25 mg  daily and 50 mg in the evening, Ativan 1 mg if needed. The patient is on IV Heparin and taking some Lortab if needed.  ALLERGIES: PENICILLIN, SULFA DRUGS, ERYTHROMYCIN. The patient does as well have some sensitivity to Vioxx which causes a rash.  SOCIAL HISTORY: Does not smoke or drink. The patient is a claims adjustor. Lives with his wife and children in [place], [place].  FAMILY HISTORY: Hypertension in the mother. Father died with Parkinsons disease, hypertension, and hyperlipidemia. Brother with Bells palsy. Sister with diabetes. Another sister who is "crazy".  REVIEW OF SYSTEMS: No recent fever or chills. The patient noted full resolution of headache. Denies neck stiffness. No focal numbness or weakness of the face, arms or legs. Denies loss of consciousness. Does have some dizziness problems.  PHYSICAL EXAMINATION:  VITAL SIGNS: Blood pressure 140/90. Heart rate 90. Respiratory rate 20. Temperature afebrile.  GENERAL: The patient is a minimally to moderately obese white male who is alert and cooperative at the time of exam.  HEENT: Head is atraumatic. Pupils equal, round, and reactive to light and accommodation. Disks flat bilaterally. Good venous pulsations are seen.  NECK: Supple. No carotid bruits noted.  RESPIRATORY: Exam clear.  CARDIAC: Regular rate and rhythm. No murmur, rub or gallop.  EXTREMITIES: Trace edema at the ankle, right greater than left.  NEURO: Cranial nerves as above. Facial asymmetry is present. The patient has good sensation of pin prick and soft touch bilaterally. Has good strength in facial muscles and the muscles with head turning. Speech is well annunciated and not aphasic. Good tongue strength. Visual fields are full. Motor testing was 5/5 strength in all fours.  Sensory testing intact to pin prick and soft touch and sensation throughout. Position sensory in intact in all fours. The patient has good finger to nose and finger to toe  bilaterally. The patient was not ambulated. Deep tendon reflexes were also symmetric. Depression of ankle jerk reflexes noted bilaterally. Toes neutral bilaterally. No Romberg and probably no pronator drift is seen.  LABORATORY DATA: Notable for sodium of 140, potassium 4.2, chloride 102, CO2 29, BUN 7, creatinine 0.8, glucose 113, white count 9.3, hemoglobin and hematocrit 13.2 and 37. Platelets 231, INR 1.0.  IMPRESSION: 1. New onset headache. 2. Deep vein thrombosis. 3. Pulmonary embolus.  INDICATIONS: This patients exam is completely normal. The patient does get occasional headaches once every month or two. They are fairly mild. The patient claims to have had some scalp tenderness associated with this current headache. Has had good resolution with the Midrin. The above headache likely represents a migraine. Again, CT of the head is normal. Good venous pulsations are seen, indicating normal intracranial pressure. I suspect that the headache above is benign in nature. No evidence of bleed. Would not pursue further workup at this point. Will follow patient clinically.  PLAN: Continue Midrin if needed. The patient may undergo scan of the head with angiogram if headache recurs or if other focal symptoms are noted. Dictated by:   Marlan Palau, M.D. Attending Physician:  Syliva Overman DD:  12/29/01 TD:  01/01/02 Job: [id number]

## 2010-11-08 NOTE — Op Note (Signed)
   NAME:  Robert [name], Robert [name]                       ACCOUNT NO.:  [id number]   MEDICAL RECORD NO.:  [id number]                   PATIENT TYPE:  OIB   LOCATION:  2855                                 FACILITY:  [facility]   PHYSICIAN:  Cindee Salt, M.D.                    DATE OF BIRTH:  03/26/1951   DATE OF PROCEDURE:  02/03/2003  DATE OF DISCHARGE:  02/03/2003                                 OPERATIVE REPORT   PREOPERATIVE DIAGNOSIS:  Collar button abscess, right hand ring and little  fingers.   POSTOPERATIVE DIAGNOSIS:  Collar button abscess, right hand ring and little  fingers.   OPERATION:  Incision and drainage of collar button abscess of right ring and  little fingers.   SURGEON:  Cindee Salt, M.D.   ASSISTANTCarolyne Fiscal.   ANESTHESIA:  Forearm based IV regional.   HISTORY:  The patient is a 60 year old male with a history of a collar  button abscess on his little finger.  He is admitted for I&D.   DESCRIPTION OF PROCEDURE:  The patient was brought to the operating room  where a forearm based IV regional anesthetic was carried out without  difficulty after being given fresh frozen plasma to reverse his Coumadin.  Prepped using Duraprep in the supine position with the right arm free.  An  incision was made over the pointing area and carried down through  subcutaneous tissue.  The abscess was immediately encountered.  This was  followed down onto the volar aspect.  A separate incision was made on the  volar side.  Cultures were taken, both aerobic and anaerobic cultures.  The  wound was copiously irrigated with saline after debridement and packed with  Iodoform cause both dorsally and palmarly.  A sterile compressive dressing  was applied.  The patient tolerated the procedure well.  He was taken to the  recovery room for observation in satisfactory condition.  He is discharged  home to return to the [facility] in one week on Vicodin and  antibiotics prescribed by Dr.  Posey Rea.                                               Cindee Salt, M.D.    GK/MEDQ  D:  02/03/2003  T:  02/04/2003  Job:  [id number]

## 2010-11-08 NOTE — Discharge Summary (Signed)
[facility]  Patient:    Robert [name], Robert [name] Visit Number: [id number] MRN: [id number]          Service Type: SUR Location: 4W 0467 01 Attending Physician:  Pierce Crane Dictated by:   Druscilla Brownie. Shela Nevin, P.A. Admit Date:  12/08/2001 Discharge Date: 12/12/2001                             Discharge Summary  ADMITTING DIAGNOSES: 1. Degenerative disk disease and spinal stenosis L5-S1. 2. History of enlarged left ventricle of his heart. 3. History of mild anxiety and depression. 4. History of epididymitis.  DISCHARGE DIAGNOSES: 1. Recurrent disk herniation at L5-S1 on the left, foraminal and spinal    stenosis L4-L5 on the left with degenerative disk disease L5-S1. 2. History of enlarged left ventricle of his heart. 3. History of mild anxiety and depression. 4. History of epididymitis.  OPERATION PERFORMED:  On December 08, 2001, the patient underwent redo diskectomy L5-S1, foraminotomy L5, lateral recess decompression L5-S1 bilaterally, posterior lumbar interbody fusion utilizing a Brantigan cage at L5-S1, posterolateral lumbar fusion utilizing pedicle screw instrumentation Depuy monarch with the lateral as fusion including local cancellous bone allomatrix supplemental bone graft. Max Cohen assisted.  CONSULTATIONS:  None.  BRIEF HISTORY:  This 60 year old male with severe lower extremity pain on the left side secondary to recurrent disk at L5-S1. He had developed pain into the left lower extremity as well as the EHL weakness. The patient was refractory to conservative treatment and due to the positive findings seen on study, it was felt he would benefit with the above procedure and after benefits and risks were discussed, we went ahead and scheduled it.  HOSPITAL COURSE:  The patient tolerated the surgical procedure quite well. He had less discomfort in his left lower extremity but some in his right lower extremity was noted. He also  had some mild numbness. The patient had some numbness into his hand thought to be secondary to ulnar nerve sensitivity the way he was lying in bed. Eventually his left leg pain completely resolved. He had some residual discomfort in the right lower, however, due to nerve manipulation during the surgery. The ulnar nerve problem dissipated in his arm. On the first postoperative day, a Hemovac was pulled without any difficulty. We added Toradol IV for an anti-inflammatory. The patient worked with physical therapy ambulating in the room. A walker was used so he could come to a standing position from a seated position without straining the back. No brace was ordered. His left chest discomfort was thought to be due primarily to positioning during surgery. The chest x-ray did not show any acute changes, slight vascular congestion was seen on the x-ray read by Dr. Kaylyn Layer. [place]. No further treatment was required on that. On the day of discharge, the patient was ambulating in his room and his wife was there with him for the instructions. His right leg pain had nearly completely dissipated, the wound was dry. It was felt that he could be maintained in a home environment and arrangements were made for discharge. A final check of his neurologic status revealed that he had more strength in the EHL. Laboratory values in the hospital hematologically showed a hematocrit and hemoglobin completely within normal limits throughout his hospital stay. He did have a mild drop to 12.8 on the hemoglobin with a hematocrit of 36.3 and that is why we started him on iron.  Blood chemistries were normal and urinalysis was negative for urinary tract infection. The preoperative chest x-ray showed no active disease. His postop film showed decreased bibasilar atelectasis or pneumonia with increased effusion read by Dwyane Luo. [place]. No electrocardiogram seen on this chart.  CONDITION ON DISCHARGE:  Improved and  stable.  PLAN:  The patient is discharged to his home in the care of his wife and family. He is to continue using his incentive spirometer at home. (He used it throughout his hospitalization). He is to continue with his home medication and diet. He may shower on the day of discharge and then use dry dressing p.r.n.  DISCHARGE MEDICATIONS: 1. Tylox #40 1-2 q. 4-6 p.r.n. pain. 2. Robaxin 500 mg #30 with a refill 1 q. 6h p.r.n. muscle spasm. 3. He was also given a prescription for Toradol 10 mg #15, 1 t.i.d. and after    he finishes with that he will use Motrin 800 mg.  Return to see Dr. Shelle Iron in 10-14 days. Dictated by:   Druscilla Brownie. Shela Nevin, P.A. Attending Physician:  Pierce Crane DD:  12/16/01 TD:  12/18/01 Job: [id number]

## 2010-11-08 NOTE — Op Note (Signed)
[facility]  Patient:    Robert [name], Robert [name] Visit Number: [id number] MRN: [id number]          Service Type: SUR Location: 4W 0467 01 Attending Physician:  Pierce Crane Dictated by:  Javier Docker, M.D. Proc. Date: 12/08/01 Admit Date:  12/08/2001                             Operative Report  PREOPERATIVE DIAGNOSES:  Recurrent disk herniation L5-S1, left; foraminal and spinal stenosis L4-5, left; degenerative disk disease L5-S1.  POSTOPERATIVE DIAGNOSES:  Recurrent disk herniation L5-S1, left, foraminal and spinal stenosis L4-5, left, degenerative disk disease L5-S1.  PROCEDURE:  Redo diskectomy L5-S1, foraminotomy L5,lateral recess decompression L5-S1 bilaterally, posterior lumbar interbody fusion utilizing a Branigan cage at L5-S1, posterolateral lumbar fusion utilizing pedicle screw instrumentation Depuy monarch with a lateral mass fusion including local cancellous bone and allomatrix supplemental bone graft.  ANESTHESIA:  General.  ASSISTANT:  Max Dalene Seltzer, M.D.  BRIEF HISTORY:  A 60 year old with severe left lower extremity radicular pain secondary to recurrent disk herniation L5-S1 spinal stenosis and progressive disk degeneration producing foraminal stenosis at L5 compressing the 5 root. He demonstrated persistent EHL weakness, positive neural tension signs, refractory to conservative treatment, operative intervention was indicated for decompression of the 5 root and fusion and stabilization of the L5-S1 interspace. The risks and benefits were discussed including bleeding, infection, injury to neurovascular structures, CSF leakage, epidural fibrosis, need for fusion in the future, worsening of symptoms, etc.  TECHNIQUE:  The patient in supine position after a satisfactory level of general anesthesia and 1 gm of Kefzol, he was placed proneon the [place] frame, all bony prominences well padded. The lumbar region was prepped  and draped in the usual sterile fashion. An incision was made from the spinous process of 3 to S1. The subcutaneous tissue was dissected, electrocautery was utilized to achieve hemostasis. The dorsolumbar fascia identified and divided in line with skin incisions, paraspinous muscles elevated from the lamina of 4 and 5 to the sacrum. We skeletonized the site at L5-S1 bilaterally, preserved the facet at 4-5, identified thetransverse processes of 5 and of S1. A McCullough retractor was placed. The operating microscope was draped and brought onto the surgical field. Care was utilized to attach epidural fibrosis from the previous diskectomy site at L5-S1 on the left. We enlarged the laminotomy, identified the pedicle of 5 and the foramen of 5 was severely stenotic compressing the 5 root between the pedicle of S1 and of 5. Following this, the foramen of S1 was found and found to be patent. A hockey stick probe placed down there and found to be patent. The epidural fibrosis gently mobilized off the L5 pedicle medially. The disk was examined and small disk herniation, none of this was removed. Unable to fully mobilize the thecal sac medially; however, to accept an interbody cage from the left it was felt place one from the verging side on the right. Detached ligamentum flavum on the right cephalad and caudad, decompressed lateral recess and performed the foraminotomy. The neural element was well protected, identified the disk space, this was confirmed with x-ray. Annulotomy was performed with a 15 blade, copious portion of disk material was removed from the disk space and thorough diskectomy was performed. The implants were curetted with Epsteins and multiple curettes and all the soft tissue was removed. Good bleeding tissue was noted. We used the spreader and the  Branigan cage set to an 11 and the scraper removing all residual material. Good bleeding tissue was noted. We irrigated the disk space.  We obtained cancellous bone from partially removing the facets bilaterally and the spinous process of 5 and S1 and this was morcellized. Fairly ample cancellous bone was obtained. Measuring the Branigan cage 9 x 11 x 25, the neural element was well protected and a laminar spreader distracting the disk space. We impacted the cageto the midline with excellent purchase. Prior to this, we took cancellous bone and packed it anteriorly and across the midline to the left side with excellent packing. The Branigan cage was found to be countersunk and this was confirmed with x-ray. This significantly distracted the left L5 foramen such that we were able to place a hockey stick probe out the foramen. I felt a need for slight additional distraction; however. After identifying the degenerative processes of 5 and the lateral aspects of the facets utilizing x-ray in the pars, I used an awl to enter the intersectionof the transverse process and the facet and then directed with a pediclefinder into the pedicle using an x-ray initially. There were some landmark anatomy differences here. He had a facet spur off the posterolateral aspect of the facet, this was subsequently removed. We then, under x-ray, advanced the pedicle probe into the body of 5 confirmed with good convergence and the appropriate angulation. This was then tapped bilaterally anda 45 screw was then inserted with excellent purchase. Prior to this, we filled with a ball-tip probe the dimensions of the pedicle and it was found to be contained. Felt from the interlaminar space with a hockey stick probeand the pedicle was uncompromised. In a similar fashion, we identified the S1 pedicle of the foramen of S1 in the outer aspect of the facet at L5-S1 and directed it medially towards the sacral promontory. Excellent purchase of less than 40 on the left and 35 on the right with excellent purchase after tapping and convergence confirmed with x-ray.  Next, we placed an interconnecting contoured rod and secured it with two set screws and distracted it 2 mm on the left and then compressed on the right to 80 pound torque after the facet  screws were placed. Prior to placing the screws, we used a high speed bur to decorticate the ala and the transverse process of 5 and the pars and packed cancellous bone and allomatrix bone graft out intothe posterolateral aspect of the spine. A hockey stick probe was placed in the foramen of 5 bilaterally found to be widely patent and the L5 nerve root was decompressed as was the S1 nerve root with no evidence of residual compression noted. Pathology seemed to be primarily collapse of the L5 foramen on the left secondary to disk degeneration and pedicle approximation. This was corrected with the surgery. The wound was copiously irrigated. No evidence of CSF leakage or active bleeding. The Hemovac that had been placed was then brought out through the stab wound in the skin. The muscles were reapproximated with #0 Vicryl simple sutures, thrombin soaked Gelfoam was placed over the laminotomy defects. The dorsolumbar fascia was reapproximated with #1 Vicryl interrupted figure-of-eight sutures The subcutaneous tissue was reapproximated with 2-0 Vicryl simple sutures,the skin was reapproximated with staples. The wound was dressed sterilely. The patient was placed supine on the hospital bed, extubated without difficulty and transported to the recovery room in satisfactory condition.The final x-rays were satisfactory in AP and lateral planes with perfectconvergence and placement of  the pedicle. Estimated blood loss was 100 cc and we gave him back 550 in cell saver. Dictated by:   Fanny Skates. Attending Physician:  Pierce Crane DD:  12/08/12 TD:  12/09/01 Job: [id number]

## 2010-11-10 ENCOUNTER — Encounter: Payer: Self-pay | Admitting: Internal Medicine

## 2010-11-10 NOTE — Assessment & Plan Note (Signed)
Pain is better 

## 2010-11-10 NOTE — Assessment & Plan Note (Signed)
On Rx 

## 2010-11-10 NOTE — Assessment & Plan Note (Signed)
On coumadin 

## 2010-11-14 ENCOUNTER — Encounter: Payer: Self-pay | Admitting: Pulmonary Disease

## 2010-11-20 ENCOUNTER — Ambulatory Visit (INDEPENDENT_AMBULATORY_CARE_PROVIDER_SITE_OTHER)
Admission: RE | Admit: 2010-11-20 | Discharge: 2010-11-20 | Disposition: A | Discharge status: Home / Self Care | Payer: BC Managed Care – PPO | Source: Ambulatory Visit | Attending: Pulmonary Disease | Admitting: Pulmonary Disease

## 2010-11-20 ENCOUNTER — Other Ambulatory Visit: Payer: Self-pay

## 2010-11-20 DIAGNOSIS — R599 Enlarged lymph nodes, unspecified: Secondary | ICD-10-CM

## 2010-11-20 DIAGNOSIS — R591 Generalized enlarged lymph nodes: Secondary | ICD-10-CM

## 2010-11-20 MED ORDER — IOHEXOL 300 MG/ML  SOLN
80.0000 mL | Freq: Once | INTRAMUSCULAR | Status: AC | PRN
  Administered 2010-11-20: 80 mL via INTRAVENOUS

## 2010-11-25 ENCOUNTER — Ambulatory Visit (INDEPENDENT_AMBULATORY_CARE_PROVIDER_SITE_OTHER): Payer: BC Managed Care – PPO | Admitting: Pulmonary Disease

## 2010-11-25 ENCOUNTER — Encounter: Payer: Self-pay | Admitting: Pulmonary Disease

## 2010-11-25 VITALS — BP 112/72 | HR 83 | Temp 97.8°F | Ht 71.0 in | Wt 291.8 lb

## 2010-11-25 DIAGNOSIS — J984 Other disorders of lung: Secondary | ICD-10-CM

## 2010-11-25 DIAGNOSIS — R911 Solitary pulmonary nodule: Secondary | ICD-10-CM

## 2010-11-25 NOTE — Patient Instructions (Addendum)
No further radiological FU needed Right lower lobe spot has been stable since 2003  Revisit your cardiac risk factors with dr Posey Rea - you have calcifications in your coronary arteries noted on the CT scan

## 2010-11-25 NOTE — Progress Notes (Signed)
  Subjective:    Patient ID: Robert [name], male    DOB: [birth year], 60 y.o.   MRN: [id number]  HPI PCP : [name]  60/M, never smoker referred for FU of RLL nodule.  He underwent CT angio for increase dyspnea & chest pain on 8/19 /11 which was neg for PE. Showed 7mm nodule in the RLL which was stable from 2003. 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 No other mediastinal LN was noted.  He is concerned that these findings may be related to his exposure to asbestos. 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 He has been maintianed on coumadin since , plan lifelong per his d/w Dr Sung Amabile then. 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  His intermittent dyspnea has been labelled as 'asthma'   May 30, 2010 Doppler shows - chronic clot in rt SFV to popliteal vein  negative PET scan - OK to proceed w ith dental surgery >. lovenox to coumadin bridge planned   11/25/2010 Has occasional dyspnea at rest without wheezing. He has multiple other somatic complaints. Ct chest with contrast reviewed - stable nodule, no lymphadenopahty His main concern is asbestos exposure again He denies hemoptysis, cough , fevers, chills, weight loss.     Review of Systems Pt denies any significant  nasal congestion or excess secretions, fever, chills, sweats, unintended wt loss, pleuritic or exertional cp, orthopnea pnd or leg swelling.  Pt also denies any obvious fluctuation in symptoms with weather or environmental change or other alleviating or aggravating factors.    Pt denies any increase in rescue therapy over baseline, denies waking up needing it or having early am exacerbations or  coughing/wheezing/ or dyspnea       Objective:   Physical Exam Gen. Pleasant, well-nourished, in no distress ENT - no lesions, no post nasal drip Neck: No JVD, no thyromegaly, no carotid bruits Lungs: no use of accessory muscles, no dullness to percussion, clear without rales or rhonchi  Cardiovascular: Rhythm regular, heart sounds  normal, no murmurs or gallops, no peripheral edema Musculoskeletal: No deformities, no cyanosis or clubbing          Assessment & Plan:

## 2010-11-26 DIAGNOSIS — R911 Solitary pulmonary nodule: Secondary | ICD-10-CM | POA: Insufficient documentation

## 2010-11-26 NOTE — Assessment & Plan Note (Signed)
RLL, present since 2003, likely benign. Lymphadenopathy -resolved. No further FU required Again his main concern is his asbestos exposure - I have reassured him that he does not have pulmonary signs of asbestosis such as pleural plaques or fibrosis.

## 2010-12-02 ENCOUNTER — Ambulatory Visit (INDEPENDENT_AMBULATORY_CARE_PROVIDER_SITE_OTHER): Payer: BC Managed Care – PPO | Admitting: *Deleted

## 2010-12-02 DIAGNOSIS — I2699 Other pulmonary embolism without acute cor pulmonale: Secondary | ICD-10-CM

## 2010-12-02 DIAGNOSIS — I80299 Phlebitis and thrombophlebitis of other deep vessels of unspecified lower extremity: Secondary | ICD-10-CM

## 2010-12-02 LAB — POCT INR: INR: 2.5

## 2010-12-30 ENCOUNTER — Ambulatory Visit (INDEPENDENT_AMBULATORY_CARE_PROVIDER_SITE_OTHER): Payer: BC Managed Care – PPO | Admitting: *Deleted

## 2010-12-30 DIAGNOSIS — I2699 Other pulmonary embolism without acute cor pulmonale: Secondary | ICD-10-CM

## 2010-12-30 DIAGNOSIS — I80299 Phlebitis and thrombophlebitis of other deep vessels of unspecified lower extremity: Secondary | ICD-10-CM

## 2010-12-30 LAB — POCT INR: INR: 3

## 2011-01-27 ENCOUNTER — Ambulatory Visit (INDEPENDENT_AMBULATORY_CARE_PROVIDER_SITE_OTHER): Payer: BC Managed Care – PPO | Admitting: *Deleted

## 2011-01-27 DIAGNOSIS — I80299 Phlebitis and thrombophlebitis of other deep vessels of unspecified lower extremity: Secondary | ICD-10-CM

## 2011-01-27 DIAGNOSIS — I2699 Other pulmonary embolism without acute cor pulmonale: Secondary | ICD-10-CM

## 2011-01-27 LAB — POCT INR: INR: 2.6

## 2011-02-11 ENCOUNTER — Ambulatory Visit: Payer: BC Managed Care – PPO | Admitting: Internal Medicine

## 2011-02-20 ENCOUNTER — Telehealth: Payer: Self-pay

## 2011-02-20 MED ORDER — CIPROFLOXACIN HCL 500 MG PO TABS: 500.0000 mg | ORAL_TABLET | Freq: Two times a day (BID) | ORAL | Status: AC

## 2011-02-20 NOTE — Telephone Encounter (Signed)
Pt called requesting ABX to treat "blood and slight pain at the end of my urethra". Pt states he may have passed a kidney stone like the one he passed 30 years prior or has a bladder infection. Pt denies fever or flank pain. Says that blood was very small amount yesterday but none today. I advised local UC. Pt says he does not trust them in [place] where he currently is with his ill mother but will wait until he return for appt if not improvement or worsening of sxs.

## 2011-02-20 NOTE — Telephone Encounter (Signed)
OK Cipro. INR in 7-10 d OV if issues Thx

## 2011-02-21 NOTE — Telephone Encounter (Signed)
Pt in [place] and will be in town later today or tomorrow. He will come in for appt if needed.

## 2011-02-25 ENCOUNTER — Other Ambulatory Visit: Payer: Self-pay | Admitting: *Deleted

## 2011-02-25 ENCOUNTER — Ambulatory Visit: Payer: BC Managed Care – PPO

## 2011-02-25 DIAGNOSIS — R635 Abnormal weight gain: Secondary | ICD-10-CM

## 2011-02-25 DIAGNOSIS — M545 Low back pain, unspecified: Secondary | ICD-10-CM | POA: Insufficient documentation

## 2011-02-25 DIAGNOSIS — Z87448 Personal history of other diseases of urinary system: Secondary | ICD-10-CM

## 2011-02-25 DIAGNOSIS — Z86718 Personal history of other venous thrombosis and embolism: Secondary | ICD-10-CM

## 2011-02-25 DIAGNOSIS — I80299 Phlebitis and thrombophlebitis of other deep vessels of unspecified lower extremity: Secondary | ICD-10-CM

## 2011-02-25 LAB — CBC WITH DIFFERENTIAL/PLATELET
Basophils Absolute: 0 10*3/uL (ref 0.0–0.1)
Basophils Relative: 0.4 % (ref 0.0–3.0)
Eosinophils Absolute: 0.7 10*3/uL (ref 0.0–0.7)
Eosinophils Relative: 7.2 % — ABNORMAL HIGH (ref 0.0–5.0)
HCT: 44.5 % (ref 39.0–52.0)
Hemoglobin: 15.4 g/dL (ref 13.0–17.0)
Lymphocytes Relative: 30.5 % (ref 12.0–46.0)
Lymphs Abs: 2.9 10*3/uL (ref 0.7–4.0)
MCHC: 34.5 g/dL (ref 30.0–36.0)
MCV: 93.7 fl (ref 78.0–100.0)
Monocytes Absolute: 0.8 10*3/uL (ref 0.1–1.0)
Monocytes Relative: 8.1 % (ref 3.0–12.0)
Neutro Abs: 5.1 10*3/uL (ref 1.4–7.7)
Neutrophils Relative %: 53.8 % (ref 43.0–77.0)
Platelets: 275 10*3/uL (ref 150.0–400.0)
RBC: 4.74 Mil/uL (ref 4.22–5.81)
RDW: 13 % (ref 11.5–14.6)
WBC: 9.4 10*3/uL (ref 4.5–10.5)

## 2011-02-25 LAB — URINALYSIS, ROUTINE W REFLEX MICROSCOPIC
Bilirubin Urine: NEGATIVE
Hgb urine dipstick: NEGATIVE
Ketones, ur: NEGATIVE
Leukocytes, UA: NEGATIVE
Nitrite: NEGATIVE
Specific Gravity, Urine: 1.01 (ref 1.000–1.030)
Total Protein, Urine: NEGATIVE
Urine Glucose: NEGATIVE
Urobilinogen, UA: 0.2 (ref 0.0–1.0)
pH: 5.5 (ref 5.0–8.0)

## 2011-02-25 LAB — COMPREHENSIVE METABOLIC PANEL
ALT: 27 U/L (ref 0–53)
AST: 22 U/L (ref 0–37)
Albumin: 4 g/dL (ref 3.5–5.2)
Alkaline Phosphatase: 57 U/L (ref 39–117)
BUN: 12 mg/dL (ref 6–23)
CO2: 27 mEq/L (ref 19–32)
Calcium: 8.6 mg/dL (ref 8.4–10.5)
Chloride: 100 mEq/L (ref 96–112)
Creatinine, Ser: 0.9 mg/dL (ref 0.4–1.5)
GFR: 90.5 mL/min (ref >60.00)
Glucose, Bld: 90 mg/dL (ref 70–99)
Potassium: 3.8 mEq/L (ref 3.5–5.1)
Sodium: 136 mEq/L (ref 135–145)
Total Bilirubin: 0.9 mg/dL (ref 0.3–1.2)
Total Protein: 7.7 g/dL (ref 6.0–8.3)

## 2011-02-25 LAB — TESTOSTERONE: Testosterone: 255.97 ng/dL — ABNORMAL LOW (ref 350.00–890.00)

## 2011-02-25 LAB — PROTIME-INR
INR: 3.3 ratio — ABNORMAL HIGH (ref 0.8–1.0)
Prothrombin Time: 32.7 s — ABNORMAL HIGH (ref 10.2–12.4)

## 2011-02-25 LAB — TSH: TSH: 3.64 u[IU]/mL (ref 0.35–5.50)

## 2011-02-26 ENCOUNTER — Ambulatory Visit (INDEPENDENT_AMBULATORY_CARE_PROVIDER_SITE_OTHER): Payer: BC Managed Care – PPO | Admitting: Internal Medicine

## 2011-02-26 ENCOUNTER — Encounter: Payer: Self-pay | Admitting: Internal Medicine

## 2011-02-26 VITALS — BP 120/80 | HR 108 | Temp 98.3°F | Resp 16 | Wt 284.0 lb

## 2011-02-26 DIAGNOSIS — M545 Low back pain, unspecified: Secondary | ICD-10-CM

## 2011-02-26 DIAGNOSIS — R310 Gross hematuria: Secondary | ICD-10-CM

## 2011-02-26 DIAGNOSIS — E538 Deficiency of other specified B group vitamins: Secondary | ICD-10-CM

## 2011-02-26 DIAGNOSIS — I2699 Other pulmonary embolism without acute cor pulmonale: Secondary | ICD-10-CM

## 2011-02-26 MED ORDER — NORTRIPTYLINE HCL 10 MG PO CAPS: 10.0000 mg | ORAL_CAPSULE | Freq: Every day | ORAL | Status: DC

## 2011-02-26 NOTE — Progress Notes (Signed)
  Subjective:    Patient ID: Robert [name], male    DOB: 29-Mar-1951, 60 y.o.   MRN: [id number]  HPI  C/o "blood and slight pain at the end of my urethra" last wk. Pt states he may have passed a kidney stone like the one he passed 30 years prior or has a bladder infection. Pt denies fever or flank pain. Says that blood was very small amount yesterday but none today.He was  in [place] where he currently is with his ill mother. Better now. He did not take his Cipro. INR was ok.  Review of Systems  Constitutional: Positive for fatigue. Negative for appetite change and unexpected weight change.  HENT: Negative for nosebleeds, congestion, sore throat, sneezing, trouble swallowing and neck pain.   Eyes: Negative for itching and visual disturbance.  Respiratory: Negative for cough.   Cardiovascular: Negative for chest pain, palpitations and leg swelling.  Gastrointestinal: Negative for nausea, diarrhea, blood in stool and abdominal distention.  Genitourinary: Negative for frequency and hematuria.  Musculoskeletal: Positive for back pain, arthralgias and gait problem.  Skin: Negative for rash.  Neurological: Positive for weakness. Negative for dizziness, tremors and speech difficulty.  Psychiatric/Behavioral: Negative for sleep disturbance, dysphoric mood and agitation. The patient is not nervous/anxious.        Objective:   Physical Exam  Constitutional: He is oriented to person, place, and time. He appears well-developed.  HENT:  Mouth/Throat: Oropharynx is clear and moist.  Eyes: Conjunctivae are normal. Pupils are equal, round, and reactive to light.  Neck: Normal range of motion. No JVD present. No thyromegaly present.  Cardiovascular: Normal rate, regular rhythm, normal heart sounds and intact distal pulses.  Exam reveals no gallop and no friction rub.   No murmur heard. Pulmonary/Chest: Effort normal and breath sounds normal. No respiratory distress. He has no wheezes. He has no  rales. He exhibits no tenderness.  Abdominal: Soft. Bowel sounds are normal. He exhibits no distension and no mass. There is no tenderness. There is no rebound and no guarding.  Musculoskeletal: Normal range of motion. He exhibits no edema and no tenderness.  Lymphadenopathy:    He has no cervical adenopathy.  Neurological: He is alert and oriented to person, place, and time. He has normal reflexes. No cranial nerve deficit. He exhibits normal muscle tone. Coordination normal.  Skin: Skin is warm and dry. No rash noted.  Psychiatric: His behavior is normal. Judgment and thought content normal.   Wt Readings from Last 3 Encounters:  02/26/11 284 lb (128.822 kg)  11/25/10 291 lb 12.8 oz (132.36 kg)  11/07/10 282 lb (127.914 kg)     Lab Results  Component Value Date   WBC 9.4 02/25/2011   HGB 15.4 02/25/2011   HCT 44.5 02/25/2011   PLT 275.0 02/25/2011   CHOL 221* 07/15/2010   TRIG 205.0* 07/15/2010   HDL 30.80* 07/15/2010   LDLDIRECT 159.7 07/15/2010   ALT 27 02/25/2011   AST 22 02/25/2011   NA 136 02/25/2011   K 3.8 02/25/2011   CL 100 02/25/2011   CREATININE 0.9 02/25/2011   BUN 12 02/25/2011   CO2 27 02/25/2011   TSH 3.64 02/25/2011   PSA 0.11 07/15/2010   INR 3.3* 02/25/2011   HGBA1C 5.3 04/19/2007        Assessment & Plan:

## 2011-02-27 ENCOUNTER — Ambulatory Visit (INDEPENDENT_AMBULATORY_CARE_PROVIDER_SITE_OTHER): Payer: Self-pay | Admitting: Cardiovascular Disease

## 2011-02-27 DIAGNOSIS — R0989 Other specified symptoms and signs involving the circulatory and respiratory systems: Secondary | ICD-10-CM

## 2011-02-28 ENCOUNTER — Telehealth: Payer: Self-pay | Admitting: Internal Medicine

## 2011-02-28 MED ORDER — CELECOXIB 200 MG PO CAPS: 200.0000 mg | ORAL_CAPSULE | Freq: Every day | ORAL | Status: DC

## 2011-02-28 NOTE — Telephone Encounter (Signed)
Pt would prefer to discuss at next OV.

## 2011-02-28 NOTE — Telephone Encounter (Signed)
Patient informed. He forgot to discuss CAD at OV. Per pt, Dr Vassie Loll was concerned about CAD and felt it was very important that patient discuss w/Dr [name]. Please advise.

## 2011-02-28 NOTE — Telephone Encounter (Signed)
Robert [name] , please, inform the patient: UA was OK  Please, keep  next office visit appointment.   Thank you !

## 2011-02-28 NOTE — Telephone Encounter (Signed)
We can schedule a Card consult. OK to schedule? Thx

## 2011-03-02 ENCOUNTER — Encounter: Payer: Self-pay | Admitting: Internal Medicine

## 2011-03-02 NOTE — Assessment & Plan Note (Signed)
Chronic  Continue with current prescription therapy as reflected on the Med list.  

## 2011-03-02 NOTE — Assessment & Plan Note (Signed)
New. Urol. consult

## 2011-03-02 NOTE — Assessment & Plan Note (Signed)
Continue with current prescription therapy as reflected on the Med list.  

## 2011-03-02 NOTE — Assessment & Plan Note (Signed)
On Coumadin 

## 2011-03-03 ENCOUNTER — Telehealth: Payer: Self-pay | Admitting: *Deleted

## 2011-03-03 ENCOUNTER — Encounter: Payer: BC Managed Care – PPO | Admitting: *Deleted

## 2011-03-03 NOTE — Telephone Encounter (Signed)
Patient requesting RX for testosterone injectable in place of topical. Please advise.

## 2011-03-04 NOTE — Telephone Encounter (Signed)
Deep injections can cause bleeding and hematomas. There is a risk. Does he still want to try? I would stick with topical preparations. Thx

## 2011-03-05 MED ORDER — TESTOSTERONE 20.25 MG/ACT (1.62%) TD GEL: 2.0000 | TRANSDERMAL | Status: DC

## 2011-03-05 NOTE — Telephone Encounter (Signed)
Left mess for patient to call back.  

## 2011-03-05 NOTE — Telephone Encounter (Signed)
Pt informed. He states he would like to stick with Androgel. Please advise on sig and quantity. Also, he wants to proceed with Cardio referral for CAD.

## 2011-03-05 NOTE — Telephone Encounter (Signed)
OK - done Thx 

## 2011-03-05 NOTE — Telephone Encounter (Signed)
RX pending signature, need to call pt to inform.

## 2011-03-06 NOTE — Telephone Encounter (Signed)
Rx signed and faxed. Pt informed on 03-05-11  to check with pharmacy.

## 2011-03-24 ENCOUNTER — Encounter: Payer: Self-pay | Admitting: Cardiology

## 2011-03-24 ENCOUNTER — Ambulatory Visit (INDEPENDENT_AMBULATORY_CARE_PROVIDER_SITE_OTHER): Payer: BC Managed Care – PPO | Admitting: *Deleted

## 2011-03-24 ENCOUNTER — Ambulatory Visit (INDEPENDENT_AMBULATORY_CARE_PROVIDER_SITE_OTHER): Payer: BC Managed Care – PPO | Admitting: Cardiology

## 2011-03-24 ENCOUNTER — Encounter: Payer: BC Managed Care – PPO | Admitting: *Deleted

## 2011-03-24 DIAGNOSIS — E785 Hyperlipidemia, unspecified: Secondary | ICD-10-CM

## 2011-03-24 DIAGNOSIS — I2699 Other pulmonary embolism without acute cor pulmonale: Secondary | ICD-10-CM

## 2011-03-24 DIAGNOSIS — R079 Chest pain, unspecified: Secondary | ICD-10-CM | POA: Insufficient documentation

## 2011-03-24 DIAGNOSIS — I80299 Phlebitis and thrombophlebitis of other deep vessels of unspecified lower extremity: Secondary | ICD-10-CM

## 2011-03-24 DIAGNOSIS — Z86718 Personal history of other venous thrombosis and embolism: Secondary | ICD-10-CM

## 2011-03-24 LAB — COMPREHENSIVE METABOLIC PANEL
ALT: 67 — ABNORMAL HIGH
AST: 40 — ABNORMAL HIGH
Albumin: 3.9
Alkaline Phosphatase: 70
BUN: 12
CO2: 24
Calcium: 9.3
Chloride: 107
Creatinine, Ser: 1.09
GFR calc Af Amer: >60
GFR calc non Af Amer: >60
Glucose, Bld: 116 — ABNORMAL HIGH
Potassium: 3.4 — ABNORMAL LOW
Sodium: 141
Total Bilirubin: 1.7 — ABNORMAL HIGH
Total Protein: 7.9

## 2011-03-24 LAB — CBC
HCT: 50.4
Hemoglobin: 17.4 — ABNORMAL HIGH
MCHC: 34.6
MCV: 93.7
Platelets: 319
RBC: 5.38
RDW: 13.1
WBC: 12.5 — ABNORMAL HIGH

## 2011-03-24 LAB — BASIC METABOLIC PANEL
BUN: 7
CO2: 23
Calcium: 8.1 — ABNORMAL LOW
Chloride: 109
Creatinine, Ser: 0.86
GFR calc Af Amer: >60
GFR calc non Af Amer: >60
Glucose, Bld: 114 — ABNORMAL HIGH
Potassium: 3.5
Sodium: 137

## 2011-03-24 LAB — PROTIME-INR
INR: 1.7 — ABNORMAL HIGH
INR: 2.1 — ABNORMAL HIGH
Prothrombin Time: 21 — ABNORMAL HIGH
Prothrombin Time: 24.5 — ABNORMAL HIGH

## 2011-03-24 LAB — CLOSTRIDIUM DIFFICILE EIA: C difficile Toxins A+B, EIA: NEGATIVE

## 2011-03-24 LAB — POCT INR: INR: 2.8

## 2011-03-24 LAB — AMYLASE: Amylase: 31

## 2011-03-24 MED ORDER — ENOXAPARIN SODIUM 150 MG/ML ~~LOC~~ SOLN: SUBCUTANEOUS | Status: DC

## 2011-03-24 NOTE — Assessment & Plan Note (Signed)
He continues on Coumadin at the direction of his primary care physician. Goal INR 2-3.

## 2011-03-24 NOTE — Progress Notes (Signed)
HPI: pleasant gentleman with no prior cardiac history for evaluation of chest pain. Patient had a pulmonary embolus in 2003. He has had occasional dyspnea since then. This can occur at any time and is not related to exertion. He also feels an occasional sharp substernal chest pain that lasts seconds. He does not have dyspnea on exertion or exertional chest pain. No orthopnea or PND. He has chronic edema in the right lower extremity that is controlled with compression hose. Occasional mild edema in the left lower extremity. He recently had a CAT scan of his chest and coronary calcification was noted. Because of the above we were asked to further evaluate.  Current Outpatient Prescriptions  Medication Sig Dispense Refill  . albuterol (PROVENTIL HFA) 108 (90 BASE) MCG/ACT inhaler Inhale 2 puffs into the lungs 4 (four) times daily as needed.        . celecoxib (CELEBREX) 200 MG capsule Take 1 capsule (200 mg total) by mouth daily.  90 capsule  1  . cetirizine (ZYRTEC) 10 MG tablet Take 10 mg by mouth daily.        . Ciprofloxacin (CIPRO PO) Take by mouth as directed.        . diphenoxylate-atropine (LOMOTIL) 2.5-0.025 MG per tablet Take 1-2 tablets by mouth 4 (four) times daily as needed. For diarrhea       . enoxaparin (LOVENOX) 150 MG/ML injection As prn      . fish oil-omega-3 fatty acids 1000 MG capsule Take 1 g by mouth daily.        . furosemide (LASIX) 20 MG tablet Take 20 mg by mouth every morning.        . Glucosamine 500 MG CAPS Take 1 capsule by mouth daily.        . niacin 500 MG tablet Take 500 mg by mouth daily.        . nortriptyline (PAMELOR) 10 MG capsule Take 1 capsule (10 mg total) by mouth at bedtime.  90 capsule  1  . Testosterone (ANDROGEL PUMP) 20.25 MG/ACT (1.62%) GEL Place 2 Act onto the skin every morning.  75 g  5  . vitamin B-12 (CYANOCOBALAMIN) 1000 MCG tablet Take 1,000 mcg by mouth daily.        [name] warfarin (COUMADIN) 6 MG tablet Take 6 mg by mouth as directed.        [name]  oxyCODONE (OXY IR/ROXICODONE) 5 MG immediate release tablet Take 5 mg by mouth 2 (two) times daily.          Allergies  Allergen Reactions  . Celebrex (Celecoxib)     SOB, swelling  . Exalgo (Hydromorphone Hcl)     Sick and nauseated  . Fentanyl     REACTION: too sleepy  . Hydrocodone-Acetaminophen     REACTION: nausea  . Oxycodone Hcl     REACTION: nausea  . Penicillins   . Sulfonamide Derivatives   . Tramadol Hcl     REACTION: did not help    Past Medical History  Diagnosis Date  . Warfarin anticoagulation   . LBP (low back pain)   . History of pulmonary embolism     2003  . Asthma   . Edema     Right Leg w/ h/o DVT postphlebitic  . Hyperlipidemia   . Osteoarthritis   . Obesity     Past Surgical History  Procedure Date  . Lumbar fusion     Dr Jillyn Hidden  . Appendectomy     Dr Jamey Ripa  .  Cholecystectomy   . Orchiectomy 1994    R post injury  . Tonsillectomy     History   Social History  . Marital Status: Married    Spouse Name: N/A    Number of Children: 3  . Years of Education: N/A   Occupational History  . Disabled   .     Social History Main Topics  . Smoking status: Never Smoker   . Smokeless tobacco: Never Used  . Alcohol Use: No  . Drug Use: No  . Sexually Active: Yes   Other Topics Concern  . Not on file   Social History Narrative  . No narrative on file    Family History  Problem Relation Age of Onset  . Hyperlipidemia Other   . Hypertension Other   . Parkinsonism Other   . Heart disease Mother     CABG at age 66    ROS: recent hematuria now improved and chronic low back pain but no fevers or chills, productive cough, hemoptysis, dysphasia, odynophagia, melena, hematochezia, dysuria,  rash, seizure activity, orthopnea, PND,  claudication. Remaining systems are negative.  Physical Exam: General:  Well developed/obese in NAD Skin warm/dry Patient not depressed No peripheral clubbing Back-normal HEENT-normal/normal eyelids Neck  supple/normal carotid upstroke bilaterally; no bruits; no JVD; no thyromegaly chest - CTA/ normal expansion CV - RRR/normal S1 and S2; no murmurs, rubs or gallops;  PMI nondisplaced Abdomen -NT/ND, no HSM, no mass, + bowel sounds, no bruit 2+ femoral pulses, no bruits Ext-no edema, chords, 2+ DP Neuro-grossly nonfocal  ECG NSR, no ST changes

## 2011-03-24 NOTE — Assessment & Plan Note (Signed)
Patient symptoms are atypical. There was coronary calcification noted on his CT scan. Plan stress echocardiogram. If no ischemia would recommend risk factor modification including diet, weight loss and exercise. He does not smoke.

## 2011-03-24 NOTE — Patient Instructions (Signed)
Continue Coumadin 1 tablet every day except 1/2 tablet on Friday.  10/13- Take last dose of Coumadin  10/14- No Coumadin or Lovenox  10/15- Lovenox 130mg  in AM and PM 10/16- Lovenox 130mg  in AM and PM 10/17- Lovenox 130mg  in AM and PM 10/18- Lovenox 130mg  in AM only 10/19- Day of surgery.  When okay with MD, restart Coumadin and Lovenox.  Take Coumadin 1 1/2 tablets x 2 days then resume normal dose.  Continue Lovenox 130mg  in AM and PM 10/23- Recheck INR.

## 2011-03-24 NOTE — Patient Instructions (Signed)
Your physician has requested that you have a stress echocardiogram. For further information please visit [email or web address]. Please follow instruction sheet as given.   

## 2011-03-24 NOTE — Assessment & Plan Note (Signed)
Management per primary care. 

## 2011-03-26 ENCOUNTER — Encounter: Payer: Self-pay | Admitting: *Deleted

## 2011-03-27 ENCOUNTER — Ambulatory Visit (HOSPITAL_COMMUNITY): Payer: BC Managed Care – PPO | Attending: Cardiology | Admitting: Radiology

## 2011-03-27 DIAGNOSIS — E785 Hyperlipidemia, unspecified: Secondary | ICD-10-CM | POA: Insufficient documentation

## 2011-03-27 DIAGNOSIS — R0609 Other forms of dyspnea: Secondary | ICD-10-CM | POA: Insufficient documentation

## 2011-03-27 DIAGNOSIS — I739 Peripheral vascular disease, unspecified: Secondary | ICD-10-CM | POA: Insufficient documentation

## 2011-03-27 DIAGNOSIS — Z86711 Personal history of pulmonary embolism: Secondary | ICD-10-CM | POA: Insufficient documentation

## 2011-03-27 DIAGNOSIS — R0989 Other specified symptoms and signs involving the circulatory and respiratory systems: Secondary | ICD-10-CM | POA: Insufficient documentation

## 2011-03-27 DIAGNOSIS — J449 Chronic obstructive pulmonary disease, unspecified: Secondary | ICD-10-CM | POA: Insufficient documentation

## 2011-03-27 DIAGNOSIS — R072 Precordial pain: Secondary | ICD-10-CM | POA: Insufficient documentation

## 2011-03-27 DIAGNOSIS — Z86718 Personal history of other venous thrombosis and embolism: Secondary | ICD-10-CM

## 2011-03-27 DIAGNOSIS — J4489 Other specified chronic obstructive pulmonary disease: Secondary | ICD-10-CM | POA: Insufficient documentation

## 2011-03-27 DIAGNOSIS — Z8249 Family history of ischemic heart disease and other diseases of the circulatory system: Secondary | ICD-10-CM | POA: Insufficient documentation

## 2011-04-04 ENCOUNTER — Ambulatory Visit (INDEPENDENT_AMBULATORY_CARE_PROVIDER_SITE_OTHER): Payer: BC Managed Care – PPO | Admitting: Endocrinology

## 2011-04-04 ENCOUNTER — Encounter: Payer: Self-pay | Admitting: Endocrinology

## 2011-04-04 DIAGNOSIS — L03019 Cellulitis of unspecified finger: Secondary | ICD-10-CM

## 2011-04-04 DIAGNOSIS — L02512 Cutaneous abscess of left hand: Secondary | ICD-10-CM

## 2011-04-04 DIAGNOSIS — L02519 Cutaneous abscess of unspecified hand: Secondary | ICD-10-CM

## 2011-04-04 MED ORDER — AZITHROMYCIN 500 MG PO TABS: 500.0000 mg | ORAL_TABLET | Freq: Every day | ORAL | Status: AC

## 2011-04-04 NOTE — Progress Notes (Signed)
Subjective:    Patient ID: Robert [name], male    DOB: February 01, 1951, 60 y.o.   MRN: [id number]  HPI 1 week of slight pain at the left ring finger, but no assoc itching. Past Medical History  Diagnosis Date  . Warfarin anticoagulation   . LBP (low back pain)   . History of pulmonary embolism     2003  . Asthma   . Edema     Right Leg w/ h/o DVT postphlebitic  . Hyperlipidemia   . Osteoarthritis   . Obesity     Past Surgical History  Procedure Date  . Lumbar fusion     Dr Jillyn Hidden  . Appendectomy     Dr Jamey Ripa  . Cholecystectomy   . Orchiectomy 1994    R post injury  . Tonsillectomy     History   Social History  . Marital Status: Married    Spouse Name: N/A    Number of Children: 3  . Years of Education: N/A   Occupational History  . Disabled   .     Social History Main Topics  . Smoking status: Never Smoker   . Smokeless tobacco: Never Used  . Alcohol Use: No  . Drug Use: No  . Sexually Active: Yes   Other Topics Concern  . Not on file   Social History Narrative  . No narrative on file    Current Outpatient Prescriptions on File Prior to Visit  Medication Sig Dispense Refill  . albuterol (PROVENTIL HFA) 108 (90 BASE) MCG/ACT inhaler Inhale 2 puffs into the lungs 4 (four) times daily as needed.        . celecoxib (CELEBREX) 200 MG capsule Take 1 capsule (200 mg total) by mouth daily.  90 capsule  1  . cetirizine (ZYRTEC) 10 MG tablet Take 10 mg by mouth daily.        . diphenoxylate-atropine (LOMOTIL) 2.5-0.025 MG per tablet Take 1-2 tablets by mouth 4 (four) times daily as needed. For diarrhea       . enoxaparin (LOVENOX) 150 MG/ML injection Inject 130mg  subq every 12 hours as directed  14 mL  0  . fish oil-omega-3 fatty acids 1000 MG capsule Take 1 g by mouth daily.        . furosemide (LASIX) 20 MG tablet Take 20 mg by mouth every morning.        . Glucosamine 500 MG CAPS Take 1 capsule by mouth daily.        . niacin 500 MG tablet Take 500 mg by  mouth daily.        . nortriptyline (PAMELOR) 10 MG capsule Take 1 capsule (10 mg total) by mouth at bedtime.  90 capsule  1  . Testosterone (ANDROGEL PUMP) 20.25 MG/ACT (1.62%) GEL Place 2 Act onto the skin every morning.  75 g  5  . vitamin B-12 (CYANOCOBALAMIN) 1000 MCG tablet Take 1,000 mcg by mouth daily.        [name] warfarin (COUMADIN) 6 MG tablet Take 6 mg by mouth as directed.        [name] oxyCODONE (OXY IR/ROXICODONE) 5 MG immediate release tablet Take 5 mg by mouth 2 (two) times daily.          Allergies  Allergen Reactions  . Celebrex (Celecoxib)     SOB, swelling  . Exalgo (Hydromorphone Hcl)     Sick and nauseated  . Fentanyl     REACTION: too sleepy  . Hydrocodone-Acetaminophen  REACTION: nausea  . Oxycodone Hcl     REACTION: nausea  . Penicillins   . Sulfonamide Derivatives   . Tramadol Hcl     REACTION: did not help    Family History  Problem Relation Age of Onset  . Hyperlipidemia Other   . Hypertension Other   . Parkinsonism Other   . Heart disease Mother     CABG at age 4    BP 132/82  Pulse 82  Temp(Src) 97 F (36.1 C) (Oral)  Ht 5\' 11"  (1.803 m)  Wt 258 lb 12.8 oz (117.391 kg)  BMI 36.10 kg/m2  SpO2 96%  Review of Systems Denies fever.  pamelor controls insomnia well.    Objective:   Physical Exam VITAL SIGNS:  See vs page GENERAL: no distress Left ring finger, dorsal aspect, between the ip joints: 1 cm erythema/tend/drainage/swelling     Assessment & Plan:  Finger abscess, new coumadin rx.  This limits antibiotic rx Insomnia.  pamelor can interact with zithromax, which is the best option in view of pcn allergy, and coumadin rx.   procedure: incision/drainage consent signed/pt agrees local 2% xylocaine without epi prep is an alcohol prep # 15 blade used to i and d the mass.  < 1 cc of purulent bloody material is expressed. no complications--elb is approx 10 drops (pt on coumadin).  Bleeding is stopped with elevation and local  pressure. Gauze pad and bandaid are applied.

## 2011-04-04 NOTE — Patient Instructions (Addendum)
i have sent a prescription to your pharmacy, for an antibiotic. While on the antibiotic, and for 2 days after, do not take the nortriptyline.  I hope you feel better soon.  If you don't feel better by next week, please call dr plotnikov.

## 2011-04-05 DIAGNOSIS — L02512 Cutaneous abscess of left hand: Secondary | ICD-10-CM | POA: Insufficient documentation

## 2011-04-15 ENCOUNTER — Ambulatory Visit (INDEPENDENT_AMBULATORY_CARE_PROVIDER_SITE_OTHER): Payer: BC Managed Care – PPO | Admitting: *Deleted

## 2011-04-15 DIAGNOSIS — I2699 Other pulmonary embolism without acute cor pulmonale: Secondary | ICD-10-CM

## 2011-04-15 DIAGNOSIS — I80299 Phlebitis and thrombophlebitis of other deep vessels of unspecified lower extremity: Secondary | ICD-10-CM

## 2011-04-15 DIAGNOSIS — Z7901 Long term (current) use of anticoagulants: Secondary | ICD-10-CM

## 2011-04-15 LAB — POCT INR: INR: 1.5

## 2011-04-18 ENCOUNTER — Ambulatory Visit (INDEPENDENT_AMBULATORY_CARE_PROVIDER_SITE_OTHER): Payer: BC Managed Care – PPO | Admitting: *Deleted

## 2011-04-18 DIAGNOSIS — I2699 Other pulmonary embolism without acute cor pulmonale: Secondary | ICD-10-CM

## 2011-04-18 DIAGNOSIS — Z7901 Long term (current) use of anticoagulants: Secondary | ICD-10-CM

## 2011-04-18 DIAGNOSIS — I80299 Phlebitis and thrombophlebitis of other deep vessels of unspecified lower extremity: Secondary | ICD-10-CM

## 2011-04-18 LAB — POCT INR: INR: 2

## 2011-04-29 ENCOUNTER — Ambulatory Visit (INDEPENDENT_AMBULATORY_CARE_PROVIDER_SITE_OTHER): Payer: BC Managed Care – PPO | Admitting: Internal Medicine

## 2011-04-29 ENCOUNTER — Encounter: Payer: Self-pay | Admitting: Internal Medicine

## 2011-04-29 DIAGNOSIS — E291 Testicular hypofunction: Secondary | ICD-10-CM

## 2011-04-29 DIAGNOSIS — M545 Low back pain, unspecified: Secondary | ICD-10-CM

## 2011-04-29 DIAGNOSIS — J984 Other disorders of lung: Secondary | ICD-10-CM

## 2011-04-29 DIAGNOSIS — R911 Solitary pulmonary nodule: Secondary | ICD-10-CM

## 2011-04-29 DIAGNOSIS — Z7901 Long term (current) use of anticoagulants: Secondary | ICD-10-CM

## 2011-04-29 DIAGNOSIS — E538 Deficiency of other specified B group vitamins: Secondary | ICD-10-CM

## 2011-04-29 DIAGNOSIS — M1810 Unilateral primary osteoarthritis of first carpometacarpal joint, unspecified hand: Secondary | ICD-10-CM

## 2011-04-29 DIAGNOSIS — M19049 Primary osteoarthritis, unspecified hand: Secondary | ICD-10-CM

## 2011-04-29 MED ORDER — OXYCODONE HCL 5 MG PO TABS: 5.0000 mg | ORAL_TABLET | Freq: Two times a day (BID) | ORAL | Status: DC

## 2011-04-29 MED ORDER — FUROSEMIDE 20 MG PO TABS: 20.0000 mg | ORAL_TABLET | ORAL | Status: DC

## 2011-04-29 MED ORDER — TESTOSTERONE MICRONIZED CRYS: CRYSTALS | Status: DC

## 2011-04-29 MED ORDER — WARFARIN SODIUM 6 MG PO TABS: 6.0000 mg | ORAL_TABLET | ORAL | Status: DC

## 2011-04-29 NOTE — Progress Notes (Signed)
  Subjective:    Patient ID: Robert [name], male    DOB: 1951/05/02, 60 y.o.   MRN: [id number]  HPI   The patient is here to follow up on chronic wt gain, anxiety, headaches and chronic moderate OA and LBP symptoms controlled with medicines, diet and exercise.   Review of Systems  Constitutional: Positive for fatigue. Negative for appetite change and unexpected weight change.  HENT: Negative for nosebleeds, congestion, sore throat, sneezing, trouble swallowing and neck pain.   Eyes: Negative for itching and visual disturbance.  Respiratory: Negative for cough.   Cardiovascular: Negative for chest pain, palpitations and leg swelling.  Gastrointestinal: Negative for nausea, diarrhea, blood in stool and abdominal distention.  Genitourinary: Negative for frequency and hematuria.  Musculoskeletal: Positive for back pain and arthralgias. Negative for joint swelling and gait problem.  Skin: Negative for rash.  Neurological: Negative for dizziness, tremors, speech difficulty and weakness.  Psychiatric/Behavioral: Negative for sleep disturbance, dysphoric mood and agitation. The patient is not nervous/anxious.    Wt Readings from Last 3 Encounters:  04/29/11 295 lb 2 oz (133.868 kg)  04/04/11 258 lb 12.8 oz (117.391 kg)  03/24/11 291 lb (131.997 kg)       Objective:   Physical Exam  Constitutional: He is oriented to person, place, and time. He appears well-developed.       obese  HENT:  Mouth/Throat: Oropharynx is clear and moist.  Eyes: Conjunctivae are normal. Pupils are equal, round, and reactive to light.  Neck: Normal range of motion. No JVD present. No thyromegaly present.  Cardiovascular: Normal rate, regular rhythm, normal heart sounds and intact distal pulses.  Exam reveals no gallop and no friction rub.   No murmur heard. Pulmonary/Chest: Effort normal and breath sounds normal. No respiratory distress. He has no wheezes. He has no rales. He exhibits no tenderness.    Abdominal: Soft. Bowel sounds are normal. He exhibits no distension and no mass. There is no tenderness. There is no rebound and no guarding.  Musculoskeletal: Normal range of motion. He exhibits tenderness (LS is tender; R hand/forearm is in a splint). He exhibits no edema.  Lymphadenopathy:    He has no cervical adenopathy.  Neurological: He is alert and oriented to person, place, and time. He has normal reflexes. No cranial nerve deficit. He exhibits normal muscle tone. Coordination normal.  Skin: Skin is warm and dry. No rash noted.  Psychiatric: He has a normal mood and affect. His behavior is normal. Judgment and thought content normal.          Assessment & Plan:

## 2011-04-29 NOTE — Assessment & Plan Note (Signed)
Continue with current prescription therapy as reflected on the Med list.  

## 2011-04-29 NOTE — Assessment & Plan Note (Signed)
B thumbs  R>>L R hand surg in 10/12

## 2011-04-29 NOTE — Assessment & Plan Note (Signed)
F/u w/Pulmonary 

## 2011-04-29 NOTE — Assessment & Plan Note (Signed)
Continue with current prescription therapy as reflected on the Med list - [facility] Clinic.  

## 2011-05-03 ENCOUNTER — Encounter: Payer: Self-pay | Admitting: Internal Medicine

## 2011-05-16 ENCOUNTER — Ambulatory Visit (INDEPENDENT_AMBULATORY_CARE_PROVIDER_SITE_OTHER): Payer: BC Managed Care – PPO | Admitting: *Deleted

## 2011-05-16 DIAGNOSIS — I2699 Other pulmonary embolism without acute cor pulmonale: Secondary | ICD-10-CM

## 2011-05-16 DIAGNOSIS — I80299 Phlebitis and thrombophlebitis of other deep vessels of unspecified lower extremity: Secondary | ICD-10-CM

## 2011-05-16 DIAGNOSIS — Z7901 Long term (current) use of anticoagulants: Secondary | ICD-10-CM

## 2011-05-16 LAB — POCT INR: INR: 2.5

## 2011-06-13 ENCOUNTER — Ambulatory Visit (INDEPENDENT_AMBULATORY_CARE_PROVIDER_SITE_OTHER): Payer: BC Managed Care – PPO | Admitting: *Deleted

## 2011-06-13 DIAGNOSIS — Z7901 Long term (current) use of anticoagulants: Secondary | ICD-10-CM

## 2011-06-13 DIAGNOSIS — I80299 Phlebitis and thrombophlebitis of other deep vessels of unspecified lower extremity: Secondary | ICD-10-CM

## 2011-06-13 DIAGNOSIS — I2699 Other pulmonary embolism without acute cor pulmonale: Secondary | ICD-10-CM

## 2011-06-13 LAB — POCT INR: INR: 2.6

## 2011-07-03 ENCOUNTER — Other Ambulatory Visit (INDEPENDENT_AMBULATORY_CARE_PROVIDER_SITE_OTHER): Payer: BC Managed Care – PPO

## 2011-07-03 DIAGNOSIS — E291 Testicular hypofunction: Secondary | ICD-10-CM

## 2011-07-03 LAB — TESTOSTERONE: Testosterone: 219.26 ng/dL — ABNORMAL LOW (ref 350.00–890.00)

## 2011-07-03 LAB — BASIC METABOLIC PANEL
BUN: 12 mg/dL (ref 6–23)
CO2: 27 mEq/L (ref 19–32)
Calcium: 8.8 mg/dL (ref 8.4–10.5)
Chloride: 101 mEq/L (ref 96–112)
Creatinine, Ser: 0.9 mg/dL (ref 0.4–1.5)
GFR: 96.48 mL/min (ref >60.00)
Glucose, Bld: 106 mg/dL — ABNORMAL HIGH (ref 70–99)
Potassium: 4.7 mEq/L (ref 3.5–5.1)
Sodium: 135 mEq/L (ref 135–145)

## 2011-07-03 LAB — HEPATIC FUNCTION PANEL
ALT: 36 U/L (ref 0–53)
AST: 35 U/L (ref 0–37)
Albumin: 3.9 g/dL (ref 3.5–5.2)
Alkaline Phosphatase: 56 U/L (ref 39–117)
Bilirubin, Direct: 0.3 mg/dL (ref 0.0–0.3)
Total Bilirubin: 0.9 mg/dL (ref 0.3–1.2)
Total Protein: 7.7 g/dL (ref 6.0–8.3)

## 2011-07-07 ENCOUNTER — Ambulatory Visit (INDEPENDENT_AMBULATORY_CARE_PROVIDER_SITE_OTHER): Payer: BC Managed Care – PPO | Admitting: Internal Medicine

## 2011-07-07 ENCOUNTER — Encounter: Payer: Self-pay | Admitting: Internal Medicine

## 2011-07-07 ENCOUNTER — Ambulatory Visit (INDEPENDENT_AMBULATORY_CARE_PROVIDER_SITE_OTHER): Payer: BC Managed Care – PPO | Admitting: *Deleted

## 2011-07-07 DIAGNOSIS — M545 Low back pain, unspecified: Secondary | ICD-10-CM

## 2011-07-07 DIAGNOSIS — E291 Testicular hypofunction: Secondary | ICD-10-CM

## 2011-07-07 DIAGNOSIS — I2699 Other pulmonary embolism without acute cor pulmonale: Secondary | ICD-10-CM

## 2011-07-07 DIAGNOSIS — I80299 Phlebitis and thrombophlebitis of other deep vessels of unspecified lower extremity: Secondary | ICD-10-CM

## 2011-07-07 DIAGNOSIS — E538 Deficiency of other specified B group vitamins: Secondary | ICD-10-CM

## 2011-07-07 DIAGNOSIS — Z7901 Long term (current) use of anticoagulants: Secondary | ICD-10-CM

## 2011-07-07 DIAGNOSIS — Z86718 Personal history of other venous thrombosis and embolism: Secondary | ICD-10-CM

## 2011-07-07 LAB — POCT INR: INR: 2.3

## 2011-07-07 MED ORDER — TESTOSTERONE 10 MG/ACT (2%) TD GEL: 2.0000 | TRANSDERMAL | Status: DC

## 2011-07-07 MED ORDER — OXYCODONE HCL 5 MG PO TABS: 5.0000 mg | ORAL_TABLET | Freq: Two times a day (BID) | ORAL | Status: DC

## 2011-07-07 NOTE — Assessment & Plan Note (Signed)
Continue with current prescription therapy as reflected on the Med list.  

## 2011-07-07 NOTE — Assessment & Plan Note (Signed)
Remote DVT/PE Lifetime anticoagulation 

## 2011-07-11 ENCOUNTER — Encounter: Payer: BC Managed Care – PPO | Admitting: *Deleted

## 2011-07-17 NOTE — Progress Notes (Signed)
  Subjective:    Patient ID: Robert [name], male    DOB: 01-Feb-1951, 61 y.o.   MRN: [id number]  HPI    The patient is here to follow up on chronic  moderate LBP/OA symptoms controlled with medicines, B12 def, h/o PE/DVT  Review of Systems  Constitutional: Positive for fatigue.  Respiratory: Negative for chest tightness and wheezing.   Cardiovascular: Positive for leg swelling.  Gastrointestinal: Negative for abdominal pain.  Neurological: Negative for syncope.  Psychiatric/Behavioral: Positive for sleep disturbance. Negative for suicidal ideas. The patient is not nervous/anxious.        Objective:   Physical Exam  Constitutional: He is oriented to person, place, and time. He appears well-developed.       obese  HENT:  Mouth/Throat: Oropharynx is clear and moist.  Eyes: Conjunctivae are normal. Pupils are equal, round, and reactive to light.  Neck: Normal range of motion. No JVD present. No thyromegaly present.  Cardiovascular: Normal rate, regular rhythm, normal heart sounds and intact distal pulses.  Exam reveals no gallop and no friction rub.   No murmur heard. Pulmonary/Chest: Effort normal and breath sounds normal. No respiratory distress. He has no wheezes. He has no rales. He exhibits no tenderness.  Abdominal: Soft. Bowel sounds are normal. He exhibits no distension and no mass. There is no tenderness. There is no rebound and no guarding.  Musculoskeletal: Normal range of motion. He exhibits edema and tenderness (LS spine).  Lymphadenopathy:    He has no cervical adenopathy.  Neurological: He is alert and oriented to person, place, and time. He has normal reflexes. No cranial nerve deficit. He exhibits normal muscle tone. Coordination normal.  Skin: Skin is warm and dry. No rash noted.  Psychiatric: He has a normal mood and affect. His behavior is normal. Judgment and thought content normal.          Assessment & Plan:

## 2011-08-18 ENCOUNTER — Ambulatory Visit (INDEPENDENT_AMBULATORY_CARE_PROVIDER_SITE_OTHER): Payer: BC Managed Care – PPO | Admitting: Pharmacist

## 2011-08-18 DIAGNOSIS — I2699 Other pulmonary embolism without acute cor pulmonale: Secondary | ICD-10-CM

## 2011-08-18 DIAGNOSIS — I80299 Phlebitis and thrombophlebitis of other deep vessels of unspecified lower extremity: Secondary | ICD-10-CM

## 2011-08-18 DIAGNOSIS — Z7901 Long term (current) use of anticoagulants: Secondary | ICD-10-CM

## 2011-08-18 LAB — POCT INR: INR: 2.6

## 2011-08-19 ENCOUNTER — Telehealth: Payer: Self-pay | Admitting: *Deleted

## 2011-08-19 NOTE — Telephone Encounter (Signed)
Left msg on vm needing renewal on his pain meds. Pls call when ready for pick-up... 08/19/11@3 :18pm/LMB

## 2011-08-19 NOTE — Telephone Encounter (Signed)
OK to fill this prescription with additional refills x0 Thank you!  

## 2011-08-20 MED ORDER — OXYCODONE HCL 5 MG PO TABS: 5.0000 mg | ORAL_TABLET | Freq: Two times a day (BID) | ORAL | Status: DC

## 2011-08-20 NOTE — Telephone Encounter (Signed)
Notified pt rx ready for pick-up... 08/20/11@9 :43am/LMB

## 2011-08-22 ENCOUNTER — Other Ambulatory Visit: Payer: Self-pay | Admitting: Internal Medicine

## 2011-09-26 ENCOUNTER — Telehealth: Payer: Self-pay | Admitting: *Deleted

## 2011-09-26 NOTE — Telephone Encounter (Signed)
Rf req for Oxycodone Hcl 5 mg. Please advise

## 2011-09-28 NOTE — Telephone Encounter (Signed)
OK to fill this prescription with additional refills x0 Thank you!  

## 2011-09-29 ENCOUNTER — Ambulatory Visit (INDEPENDENT_AMBULATORY_CARE_PROVIDER_SITE_OTHER): Payer: BC Managed Care – PPO

## 2011-09-29 DIAGNOSIS — I80299 Phlebitis and thrombophlebitis of other deep vessels of unspecified lower extremity: Secondary | ICD-10-CM

## 2011-09-29 DIAGNOSIS — I2699 Other pulmonary embolism without acute cor pulmonale: Secondary | ICD-10-CM

## 2011-09-29 DIAGNOSIS — Z7901 Long term (current) use of anticoagulants: Secondary | ICD-10-CM

## 2011-09-29 LAB — POCT INR: INR: 3.1

## 2011-09-29 MED ORDER — OXYCODONE HCL 5 MG PO TABS: 5.0000 mg | ORAL_TABLET | Freq: Two times a day (BID) | ORAL | Status: DC

## 2011-09-29 NOTE — Telephone Encounter (Signed)
Rx printed/signed. Left detailed mess on pt's cell advising him Rx is upfront/ready for p/u.

## 2011-10-09 ENCOUNTER — Other Ambulatory Visit (INDEPENDENT_AMBULATORY_CARE_PROVIDER_SITE_OTHER): Payer: BC Managed Care – PPO

## 2011-10-09 DIAGNOSIS — E538 Deficiency of other specified B group vitamins: Secondary | ICD-10-CM

## 2011-10-09 DIAGNOSIS — I80299 Phlebitis and thrombophlebitis of other deep vessels of unspecified lower extremity: Secondary | ICD-10-CM

## 2011-10-09 DIAGNOSIS — E291 Testicular hypofunction: Secondary | ICD-10-CM

## 2011-10-09 DIAGNOSIS — M545 Low back pain, unspecified: Secondary | ICD-10-CM

## 2011-10-09 DIAGNOSIS — Z86718 Personal history of other venous thrombosis and embolism: Secondary | ICD-10-CM

## 2011-10-09 LAB — BASIC METABOLIC PANEL
BUN: 14 mg/dL (ref 6–23)
CO2: 30 mEq/L (ref 19–32)
Calcium: 9.2 mg/dL (ref 8.4–10.5)
Chloride: 101 mEq/L (ref 96–112)
Creatinine, Ser: 1 mg/dL (ref 0.4–1.5)
GFR: 85.94 mL/min (ref >60.00)
Glucose, Bld: 85 mg/dL (ref 70–99)
Potassium: 4.6 mEq/L (ref 3.5–5.1)
Sodium: 138 mEq/L (ref 135–145)

## 2011-10-09 LAB — HEPATIC FUNCTION PANEL
ALT: 30 U/L (ref 0–53)
AST: 26 U/L (ref 0–37)
Albumin: 4 g/dL (ref 3.5–5.2)
Alkaline Phosphatase: 62 U/L (ref 39–117)
Bilirubin, Direct: 0.1 mg/dL (ref 0.0–0.3)
Total Bilirubin: 0.6 mg/dL (ref 0.3–1.2)
Total Protein: 7.9 g/dL (ref 6.0–8.3)

## 2011-10-10 LAB — TESTOSTERONE: Testosterone: 263.8 ng/dL — ABNORMAL LOW (ref 350.00–890.00)

## 2011-10-13 ENCOUNTER — Ambulatory Visit (INDEPENDENT_AMBULATORY_CARE_PROVIDER_SITE_OTHER): Payer: BC Managed Care – PPO | Admitting: Internal Medicine

## 2011-10-13 ENCOUNTER — Encounter: Payer: Self-pay | Admitting: Internal Medicine

## 2011-10-13 VITALS — BP 120/90 | HR 80 | Temp 97.6°F | Resp 16 | Wt 300.0 lb

## 2011-10-13 DIAGNOSIS — M545 Low back pain, unspecified: Secondary | ICD-10-CM

## 2011-10-13 DIAGNOSIS — I80299 Phlebitis and thrombophlebitis of other deep vessels of unspecified lower extremity: Secondary | ICD-10-CM

## 2011-10-13 DIAGNOSIS — E538 Deficiency of other specified B group vitamins: Secondary | ICD-10-CM

## 2011-10-13 DIAGNOSIS — E669 Obesity, unspecified: Secondary | ICD-10-CM

## 2011-10-13 DIAGNOSIS — I2699 Other pulmonary embolism without acute cor pulmonale: Secondary | ICD-10-CM

## 2011-10-13 DIAGNOSIS — J45909 Unspecified asthma, uncomplicated: Secondary | ICD-10-CM

## 2011-10-13 MED ORDER — ALBUTEROL SULFATE HFA 108 (90 BASE) MCG/ACT IN AERS: 2.0000 | INHALATION_SPRAY | Freq: Four times a day (QID) | RESPIRATORY_TRACT | Status: DC | PRN

## 2011-10-13 MED ORDER — OXYCODONE HCL 5 MG PO TABS: 5.0000 mg | ORAL_TABLET | Freq: Two times a day (BID) | ORAL | Status: DC

## 2011-10-13 NOTE — Assessment & Plan Note (Signed)
Continue with current prescription therapy as reflected on the Med list.  

## 2011-10-13 NOTE — Progress Notes (Signed)
Patient ID: Robert [name], male   DOB: 1951/02/07, 61 y.o.   MRN: [id number]  Subjective:    Patient ID: Robert [name], male    DOB: November 29, 1950, 61 y.o.   MRN: [id number]  HPI    The patient is here to follow up on chronic  moderate LBP/OA symptoms controlled with medicines, B12 def, h/o PE/DVT C/o LBP and LLE pain 5-6/10; 10/10 in am  BP Readings from Last 3 Encounters:  10/13/11 120/90  07/07/11 140/90  04/29/11 112/78   Wt Readings from Last 3 Encounters:  10/13/11 300 lb (136.079 kg)  07/07/11 301 lb (136.533 kg)  04/29/11 295 lb 2 oz (133.868 kg)      Review of Systems  Constitutional: Positive for fatigue.  Respiratory: Negative for chest tightness and wheezing.   Cardiovascular: Positive for leg swelling.  Gastrointestinal: Negative for abdominal pain.  Neurological: Negative for syncope.  Psychiatric/Behavioral: Positive for sleep disturbance. Negative for suicidal ideas. The patient is not nervous/anxious.        Objective:   Physical Exam  Constitutional: He is oriented to person, place, and time. He appears well-developed.       obese  HENT:  Mouth/Throat: Oropharynx is clear and moist.  Eyes: Conjunctivae are normal. Pupils are equal, round, and reactive to light.  Neck: Normal range of motion. No JVD present. No thyromegaly present.  Cardiovascular: Normal rate, regular rhythm, normal heart sounds and intact distal pulses.  Exam reveals no gallop and no friction rub.   No murmur heard. Pulmonary/Chest: Effort normal and breath sounds normal. No respiratory distress. He has no wheezes. He has no rales. He exhibits no tenderness.  Abdominal: Soft. Bowel sounds are normal. He exhibits no distension and no mass. There is no tenderness. There is no rebound and no guarding.  Musculoskeletal: Normal range of motion. He exhibits edema and tenderness (LS spine).  Lymphadenopathy:    He has no cervical adenopathy.  Neurological: He is alert and oriented to  person, place, and time. He has normal reflexes. No cranial nerve deficit. He exhibits normal muscle tone. Coordination normal.  Skin: Skin is warm and dry. No rash noted.  Psychiatric: He has a normal mood and affect. His behavior is normal. Judgment and thought content normal.    Lab Results  Component Value Date   WBC 9.4 02/25/2011   HGB 15.4 02/25/2011   HCT 44.5 02/25/2011   PLT 275.0 02/25/2011   GLUCOSE 85 10/09/2011   CHOL 221* 07/15/2010   TRIG 205.0* 07/15/2010   HDL 30.80* 07/15/2010   LDLDIRECT 159.7 07/15/2010   ALT 30 10/09/2011   AST 26 10/09/2011   NA 138 10/09/2011   K 4.6 10/09/2011   CL 101 10/09/2011   CREATININE 1.0 10/09/2011   BUN 14 10/09/2011   CO2 30 10/09/2011   TSH 3.64 02/25/2011   PSA 0.11 07/15/2010   INR 3.1 09/29/2011   HGBA1C 5.3 04/19/2007         Assessment & Plan:

## 2011-10-13 NOTE — Assessment & Plan Note (Signed)
Chronic, seasonal

## 2011-10-13 NOTE — Assessment & Plan Note (Signed)
Wt Readings from Last 3 Encounters:  10/13/11 300 lb (136.079 kg)  07/07/11 301 lb (136.533 kg)  04/29/11 295 lb 2 oz (133.868 kg)

## 2011-10-27 ENCOUNTER — Ambulatory Visit (INDEPENDENT_AMBULATORY_CARE_PROVIDER_SITE_OTHER): Payer: BC Managed Care – PPO

## 2011-10-27 DIAGNOSIS — Z7901 Long term (current) use of anticoagulants: Secondary | ICD-10-CM

## 2011-10-27 DIAGNOSIS — I80299 Phlebitis and thrombophlebitis of other deep vessels of unspecified lower extremity: Secondary | ICD-10-CM

## 2011-10-27 DIAGNOSIS — I2699 Other pulmonary embolism without acute cor pulmonale: Secondary | ICD-10-CM

## 2011-10-27 LAB — POCT INR: INR: 2.7

## 2011-11-10 ENCOUNTER — Telehealth: Payer: Self-pay | Admitting: Internal Medicine

## 2011-11-10 DIAGNOSIS — R059 Cough, unspecified: Secondary | ICD-10-CM

## 2011-11-10 DIAGNOSIS — R05 Cough: Secondary | ICD-10-CM

## 2011-11-10 NOTE — Telephone Encounter (Signed)
Per wife - bad cough ?need for a CT I reviewed the chart. Last CT - no change. I would rec a f/u w/Dr Vassie Loll (pulm) - I'll refer Thx

## 2011-11-11 NOTE — Telephone Encounter (Signed)
Pt was already contacted by [facility], [facility] Closing phone note.

## 2011-12-01 ENCOUNTER — Other Ambulatory Visit: Payer: Self-pay | Admitting: *Deleted

## 2011-12-01 ENCOUNTER — Institutional Professional Consult (permissible substitution): Payer: BC Managed Care – PPO | Admitting: Internal Medicine

## 2011-12-01 NOTE — Telephone Encounter (Signed)
Please fill x1 Thx  

## 2011-12-01 NOTE — Telephone Encounter (Signed)
Pt is requesting refill of Oxycodone rx last written 10/13/2011 #60 with 0 refills. Please advise.

## 2011-12-02 MED ORDER — OXYCODONE HCL 5 MG PO TABS: 5.0000 mg | ORAL_TABLET | Freq: Two times a day (BID) | ORAL | Status: DC

## 2011-12-02 NOTE — Telephone Encounter (Signed)
Pt informed rx ready for pickup, rx placed upfront in cabinet ready for pickup.

## 2011-12-02 NOTE — Telephone Encounter (Signed)
Rx printed, awaiting MD's signature.  

## 2011-12-05 ENCOUNTER — Other Ambulatory Visit: Payer: Self-pay | Admitting: *Deleted

## 2011-12-05 MED ORDER — FUROSEMIDE 20 MG PO TABS: 20.0000 mg | ORAL_TABLET | ORAL | Status: DC

## 2011-12-08 ENCOUNTER — Ambulatory Visit (INDEPENDENT_AMBULATORY_CARE_PROVIDER_SITE_OTHER): Payer: BC Managed Care – PPO

## 2011-12-08 DIAGNOSIS — Z7901 Long term (current) use of anticoagulants: Secondary | ICD-10-CM

## 2011-12-08 DIAGNOSIS — I2699 Other pulmonary embolism without acute cor pulmonale: Secondary | ICD-10-CM

## 2011-12-08 DIAGNOSIS — I80299 Phlebitis and thrombophlebitis of other deep vessels of unspecified lower extremity: Secondary | ICD-10-CM

## 2011-12-08 LAB — POCT INR: INR: 2.3

## 2011-12-18 ENCOUNTER — Ambulatory Visit (INDEPENDENT_AMBULATORY_CARE_PROVIDER_SITE_OTHER): Payer: BC Managed Care – PPO | Admitting: Internal Medicine

## 2011-12-18 ENCOUNTER — Encounter: Payer: Self-pay | Admitting: Internal Medicine

## 2011-12-18 VITALS — BP 130/90 | HR 88 | Temp 98.1°F | Resp 16 | Wt 292.0 lb

## 2011-12-18 DIAGNOSIS — E669 Obesity, unspecified: Secondary | ICD-10-CM

## 2011-12-18 DIAGNOSIS — M1810 Unilateral primary osteoarthritis of first carpometacarpal joint, unspecified hand: Secondary | ICD-10-CM

## 2011-12-18 DIAGNOSIS — M545 Low back pain, unspecified: Secondary | ICD-10-CM

## 2011-12-18 DIAGNOSIS — J45909 Unspecified asthma, uncomplicated: Secondary | ICD-10-CM

## 2011-12-18 DIAGNOSIS — E538 Deficiency of other specified B group vitamins: Secondary | ICD-10-CM

## 2011-12-18 DIAGNOSIS — M19049 Primary osteoarthritis, unspecified hand: Secondary | ICD-10-CM

## 2011-12-18 MED ORDER — OXYCODONE HCL 5 MG PO TABS: 5.0000 mg | ORAL_TABLET | Freq: Two times a day (BID) | ORAL | Status: DC

## 2011-12-18 NOTE — Assessment & Plan Note (Signed)
Better on diet 

## 2011-12-18 NOTE — Assessment & Plan Note (Signed)
Continue with current prescription therapy as reflected on the Med list.  

## 2011-12-18 NOTE — Progress Notes (Signed)
  Subjective:    Patient ID: Robert [name], male    DOB: 03/09/1951, 61 y.o.   MRN: [id number]  HPI    The patient is here to follow up on chronic  moderate LBP/OA symptoms controlled with medicines, hand pain, B12 def, h/o PE/DVT  C/o LBP and LLE pain 5-6/10; 10/10 in am  BP Readings from Last 3 Encounters:  12/18/11 130/90  10/13/11 120/90  07/07/11 140/90   Wt Readings from Last 3 Encounters:  12/18/11 292 lb (132.45 kg)  10/13/11 300 lb (136.079 kg)  07/07/11 301 lb (136.533 kg)      Review of Systems  Constitutional: Positive for fatigue.  HENT: Negative for congestion and sinus pressure.   Respiratory: Positive for cough. Negative for chest tightness, shortness of breath and wheezing.   Cardiovascular: Positive for leg swelling.  Gastrointestinal: Negative for abdominal pain.  Genitourinary: Negative for frequency.  Musculoskeletal: Positive for back pain and arthralgias. Negative for myalgias.  Neurological: Negative for syncope.  Psychiatric/Behavioral: Positive for disturbed wake/sleep cycle. Negative for suicidal ideas and dysphoric mood. The patient is not nervous/anxious.        Objective:   Physical Exam  Constitutional: He is oriented to person, place, and time. He appears well-developed.       obese  HENT:  Mouth/Throat: Oropharynx is clear and moist.  Eyes: Conjunctivae are normal. Pupils are equal, round, and reactive to light.  Neck: Normal range of motion. No JVD present. No thyromegaly present.  Cardiovascular: Normal rate, regular rhythm, normal heart sounds and intact distal pulses.  Exam reveals no gallop and no friction rub.   No murmur heard. Pulmonary/Chest: Effort normal and breath sounds normal. No respiratory distress. He has no wheezes. He has no rales. He exhibits no tenderness.  Abdominal: Soft. Bowel sounds are normal. He exhibits no distension and no mass. There is no tenderness. There is no rebound and no guarding.    Musculoskeletal: Normal range of motion. He exhibits edema and tenderness (LS spine).  Lymphadenopathy:    He has no cervical adenopathy.  Neurological: He is alert and oriented to person, place, and time. He has normal reflexes. No cranial nerve deficit. He exhibits normal muscle tone. Coordination normal.  Skin: Skin is warm and dry. No rash noted.  Psychiatric: He has a normal mood and affect. His behavior is normal. Judgment and thought content normal.    Lab Results  Component Value Date   WBC 9.4 02/25/2011   HGB 15.4 02/25/2011   HCT 44.5 02/25/2011   PLT 275.0 02/25/2011   GLUCOSE 85 10/09/2011   CHOL 221* 07/15/2010   TRIG 205.0* 07/15/2010   HDL 30.80* 07/15/2010   LDLDIRECT 159.7 07/15/2010   ALT 30 10/09/2011   AST 26 10/09/2011   NA 138 10/09/2011   K 4.6 10/09/2011   CL 101 10/09/2011   CREATININE 1.0 10/09/2011   BUN 14 10/09/2011   CO2 30 10/09/2011   TSH 3.64 02/25/2011   PSA 0.11 07/15/2010   INR 2.3 12/08/2011   HGBA1C 5.3 04/19/2007         Assessment & Plan:

## 2011-12-29 ENCOUNTER — Ambulatory Visit (INDEPENDENT_AMBULATORY_CARE_PROVIDER_SITE_OTHER): Payer: BC Managed Care – PPO | Admitting: *Deleted

## 2011-12-29 DIAGNOSIS — Z7901 Long term (current) use of anticoagulants: Secondary | ICD-10-CM

## 2011-12-29 DIAGNOSIS — I2699 Other pulmonary embolism without acute cor pulmonale: Secondary | ICD-10-CM

## 2011-12-29 DIAGNOSIS — I80299 Phlebitis and thrombophlebitis of other deep vessels of unspecified lower extremity: Secondary | ICD-10-CM

## 2011-12-29 LAB — POCT INR: INR: 2.3

## 2012-01-29 ENCOUNTER — Other Ambulatory Visit: Payer: Self-pay

## 2012-01-31 ENCOUNTER — Other Ambulatory Visit: Payer: Self-pay | Admitting: Internal Medicine

## 2012-02-02 MED ORDER — OXYCODONE HCL 5 MG PO TABS: 5.0000 mg | ORAL_TABLET | Freq: Two times a day (BID) | ORAL | Status: DC

## 2012-02-02 NOTE — Telephone Encounter (Signed)
Left message informing pt's wife rx ready for pickup. Rx placed upfront in cabinet.

## 2012-02-09 ENCOUNTER — Ambulatory Visit (INDEPENDENT_AMBULATORY_CARE_PROVIDER_SITE_OTHER): Payer: BC Managed Care – PPO

## 2012-02-09 DIAGNOSIS — Z7901 Long term (current) use of anticoagulants: Secondary | ICD-10-CM

## 2012-02-09 DIAGNOSIS — I2699 Other pulmonary embolism without acute cor pulmonale: Secondary | ICD-10-CM

## 2012-02-09 DIAGNOSIS — I80299 Phlebitis and thrombophlebitis of other deep vessels of unspecified lower extremity: Secondary | ICD-10-CM

## 2012-02-09 LAB — POCT INR: INR: 2.8

## 2012-02-25 ENCOUNTER — Encounter: Payer: Self-pay | Admitting: General Practice

## 2012-03-01 ENCOUNTER — Telehealth: Payer: Self-pay | Admitting: Internal Medicine

## 2012-03-01 NOTE — Telephone Encounter (Signed)
Pt wife called stating husband needs a refill of Roxicodone was told to double up medication per Dr. Macario Golds.

## 2012-03-02 ENCOUNTER — Other Ambulatory Visit: Payer: Self-pay | Admitting: *Deleted

## 2012-03-02 MED ORDER — OXYCODONE HCL 5 MG PO TABS: 5.0000 mg | ORAL_TABLET | Freq: Two times a day (BID) | ORAL | Status: DC

## 2012-03-02 NOTE — Telephone Encounter (Signed)
Ok for single refill

## 2012-03-02 NOTE — Telephone Encounter (Signed)
Rx printed/pending MD sig, Informed pt's wife to p/u Rx.

## 2012-03-17 ENCOUNTER — Other Ambulatory Visit (INDEPENDENT_AMBULATORY_CARE_PROVIDER_SITE_OTHER): Payer: BC Managed Care – PPO

## 2012-03-17 DIAGNOSIS — E538 Deficiency of other specified B group vitamins: Secondary | ICD-10-CM

## 2012-03-17 DIAGNOSIS — M545 Low back pain, unspecified: Secondary | ICD-10-CM

## 2012-03-17 DIAGNOSIS — M1810 Unilateral primary osteoarthritis of first carpometacarpal joint, unspecified hand: Secondary | ICD-10-CM

## 2012-03-17 DIAGNOSIS — J45909 Unspecified asthma, uncomplicated: Secondary | ICD-10-CM

## 2012-03-17 DIAGNOSIS — E669 Obesity, unspecified: Secondary | ICD-10-CM

## 2012-03-17 DIAGNOSIS — M19049 Primary osteoarthritis, unspecified hand: Secondary | ICD-10-CM

## 2012-03-17 LAB — BASIC METABOLIC PANEL
BUN: 16 mg/dL (ref 6–23)
CO2: 28 mEq/L (ref 19–32)
Calcium: 9.2 mg/dL (ref 8.4–10.5)
Chloride: 102 mEq/L (ref 96–112)
Creatinine, Ser: 1.1 mg/dL (ref 0.4–1.5)
GFR: 76.45 mL/min (ref >60.00)
Glucose, Bld: 100 mg/dL — ABNORMAL HIGH (ref 70–99)
Potassium: 5.4 mEq/L — ABNORMAL HIGH (ref 3.5–5.1)
Sodium: 138 mEq/L (ref 135–145)

## 2012-03-22 ENCOUNTER — Encounter: Payer: Self-pay | Admitting: Internal Medicine

## 2012-03-22 ENCOUNTER — Ambulatory Visit (INDEPENDENT_AMBULATORY_CARE_PROVIDER_SITE_OTHER): Payer: BC Managed Care – PPO | Admitting: *Deleted

## 2012-03-22 ENCOUNTER — Ambulatory Visit (INDEPENDENT_AMBULATORY_CARE_PROVIDER_SITE_OTHER): Payer: BC Managed Care – PPO | Admitting: Internal Medicine

## 2012-03-22 VITALS — BP 138/80 | HR 80 | Temp 98.4°F | Resp 16 | Wt 291.0 lb

## 2012-03-22 DIAGNOSIS — Z23 Encounter for immunization: Secondary | ICD-10-CM

## 2012-03-22 DIAGNOSIS — Z7901 Long term (current) use of anticoagulants: Secondary | ICD-10-CM

## 2012-03-22 DIAGNOSIS — E669 Obesity, unspecified: Secondary | ICD-10-CM

## 2012-03-22 DIAGNOSIS — E875 Hyperkalemia: Secondary | ICD-10-CM

## 2012-03-22 DIAGNOSIS — E538 Deficiency of other specified B group vitamins: Secondary | ICD-10-CM

## 2012-03-22 DIAGNOSIS — I80299 Phlebitis and thrombophlebitis of other deep vessels of unspecified lower extremity: Secondary | ICD-10-CM

## 2012-03-22 DIAGNOSIS — I2699 Other pulmonary embolism without acute cor pulmonale: Secondary | ICD-10-CM

## 2012-03-22 DIAGNOSIS — J019 Acute sinusitis, unspecified: Secondary | ICD-10-CM

## 2012-03-22 DIAGNOSIS — J45909 Unspecified asthma, uncomplicated: Secondary | ICD-10-CM

## 2012-03-22 DIAGNOSIS — M545 Low back pain, unspecified: Secondary | ICD-10-CM

## 2012-03-22 LAB — POCT INR: INR: 2.2

## 2012-03-22 MED ORDER — OXYCODONE HCL 5 MG PO TABS: 5.0000 mg | ORAL_TABLET | Freq: Three times a day (TID) | ORAL | Status: DC | PRN

## 2012-03-22 MED ORDER — LEVOFLOXACIN 500 MG PO TABS: 500.0000 mg | ORAL_TABLET | Freq: Every day | ORAL | Status: DC

## 2012-03-22 NOTE — Progress Notes (Signed)
   Subjective:    Patient ID: Robert [name], male    DOB: Jul 06, 1950, 61 y.o.   MRN: [id number]  HPI    The patient is here to follow up on chronic  Severe to moderate LBP/OA symptoms controlled partially with medicines (oxycodone), hand pain, B12 def, h/o PE/DVT  C/o LBP and LLE pain 5-6/10; 10/10 in am   BP Readings from Last 3 Encounters:  03/22/12 138/80  12/18/11 130/90  10/13/11 120/90   Wt Readings from Last 3 Encounters:  03/22/12 291 lb (131.997 kg)  12/18/11 292 lb (132.45 kg)  10/13/11 300 lb (136.079 kg)      Review of Systems  Constitutional: Positive for fatigue.  HENT: Negative for congestion and sinus pressure.   Respiratory: Positive for cough. Negative for chest tightness, shortness of breath and wheezing.   Cardiovascular: Positive for leg swelling.  Gastrointestinal: Negative for abdominal pain.  Genitourinary: Negative for frequency.  Musculoskeletal: Positive for back pain and arthralgias. Negative for myalgias.  Neurological: Negative for syncope.  Psychiatric/Behavioral: Positive for disturbed wake/sleep cycle. Negative for suicidal ideas and dysphoric mood. The patient is not nervous/anxious.        Objective:   Physical Exam  Constitutional: He is oriented to person, place, and time. He appears well-developed.       obese  HENT:  Mouth/Throat: Oropharynx is clear and moist.  Eyes: Conjunctivae normal are normal. Pupils are equal, round, and reactive to light.  Neck: Normal range of motion. No JVD present. No thyromegaly present.  Cardiovascular: Normal rate, regular rhythm, normal heart sounds and intact distal pulses.  Exam reveals no gallop and no friction rub.   No murmur heard. Pulmonary/Chest: Effort normal and breath sounds normal. No respiratory distress. He has no wheezes. He has no rales. He exhibits no tenderness.  Abdominal: Soft. Bowel sounds are normal. He exhibits no distension and no mass. There is no tenderness. There is no  rebound and no guarding.  Musculoskeletal: Normal range of motion. He exhibits edema and tenderness (LS spine).  Lymphadenopathy:    He has no cervical adenopathy.  Neurological: He is alert and oriented to person, place, and time. He has normal reflexes. No cranial nerve deficit. He exhibits normal muscle tone. Coordination normal.  Skin: Skin is warm and dry. No rash noted.  Psychiatric: He has a normal mood and affect. His behavior is normal. Judgment and thought content normal.    Lab Results  Component Value Date   WBC 9.4 02/25/2011   HGB 15.4 02/25/2011   HCT 44.5 02/25/2011   PLT 275.0 02/25/2011   GLUCOSE 100* 03/17/2012   CHOL 221* 07/15/2010   TRIG 205.0* 07/15/2010   HDL 30.80* 07/15/2010   LDLDIRECT 159.7 07/15/2010   ALT 30 10/09/2011   AST 26 10/09/2011   NA 138 03/17/2012   K 5.4* 03/17/2012   CL 102 03/17/2012   CREATININE 1.1 03/17/2012   BUN 16 03/17/2012   CO2 28 03/17/2012   TSH 3.64 02/25/2011   PSA 0.11 07/15/2010   INR 2.2 03/22/2012   HGBA1C 5.3 04/19/2007         Assessment & Plan:

## 2012-03-22 NOTE — Assessment & Plan Note (Signed)
Continue with current prescription therapy as reflected on the Med list.  

## 2012-03-22 NOTE — Assessment & Plan Note (Signed)
levaquin 10 d

## 2012-03-22 NOTE — Assessment & Plan Note (Addendum)
Continue with current prescription therapy as reflected on the Med list. Coum clinic at [facility]

## 2012-04-23 ENCOUNTER — Telehealth: Payer: Self-pay | Admitting: Internal Medicine

## 2012-04-23 MED ORDER — LEVOFLOXACIN 500 MG PO TABS: 500.0000 mg | ORAL_TABLET | Freq: Every day | ORAL | Status: DC

## 2012-04-23 NOTE — Telephone Encounter (Signed)
C/o sinusitis Abx sent Thx

## 2012-05-03 ENCOUNTER — Ambulatory Visit (INDEPENDENT_AMBULATORY_CARE_PROVIDER_SITE_OTHER): Payer: BC Managed Care – PPO | Admitting: General Practice

## 2012-05-03 DIAGNOSIS — I2699 Other pulmonary embolism without acute cor pulmonale: Secondary | ICD-10-CM

## 2012-05-03 DIAGNOSIS — Z7901 Long term (current) use of anticoagulants: Secondary | ICD-10-CM

## 2012-05-03 DIAGNOSIS — I80299 Phlebitis and thrombophlebitis of other deep vessels of unspecified lower extremity: Secondary | ICD-10-CM

## 2012-05-03 LAB — POCT INR: INR: 2.1

## 2012-05-25 ENCOUNTER — Other Ambulatory Visit: Payer: Self-pay | Admitting: Internal Medicine

## 2012-06-09 ENCOUNTER — Other Ambulatory Visit: Payer: Self-pay | Admitting: Internal Medicine

## 2012-06-14 ENCOUNTER — Other Ambulatory Visit: Payer: Self-pay | Admitting: Internal Medicine

## 2012-06-14 MED ORDER — LEVOFLOXACIN 500 MG PO TABS: 500.0000 mg | ORAL_TABLET | Freq: Every day | ORAL | Status: DC

## 2012-06-14 MED ORDER — OXYCODONE HCL 5 MG PO TABS: 5.0000 mg | ORAL_TABLET | Freq: Three times a day (TID) | ORAL | Status: DC | PRN

## 2012-06-22 ENCOUNTER — Encounter: Payer: Self-pay | Admitting: Internal Medicine

## 2012-06-22 ENCOUNTER — Ambulatory Visit (INDEPENDENT_AMBULATORY_CARE_PROVIDER_SITE_OTHER): Payer: BC Managed Care – PPO | Admitting: Internal Medicine

## 2012-06-22 ENCOUNTER — Ambulatory Visit (INDEPENDENT_AMBULATORY_CARE_PROVIDER_SITE_OTHER): Payer: BC Managed Care – PPO | Admitting: General Practice

## 2012-06-22 VITALS — BP 120/72 | HR 80 | Temp 97.7°F | Ht 71.0 in | Wt 300.2 lb

## 2012-06-22 DIAGNOSIS — I80299 Phlebitis and thrombophlebitis of other deep vessels of unspecified lower extremity: Secondary | ICD-10-CM

## 2012-06-22 DIAGNOSIS — J45909 Unspecified asthma, uncomplicated: Secondary | ICD-10-CM

## 2012-06-22 DIAGNOSIS — Z7901 Long term (current) use of anticoagulants: Secondary | ICD-10-CM

## 2012-06-22 DIAGNOSIS — J329 Chronic sinusitis, unspecified: Secondary | ICD-10-CM | POA: Insufficient documentation

## 2012-06-22 DIAGNOSIS — I2699 Other pulmonary embolism without acute cor pulmonale: Secondary | ICD-10-CM

## 2012-06-22 DIAGNOSIS — R609 Edema, unspecified: Secondary | ICD-10-CM

## 2012-06-22 DIAGNOSIS — E669 Obesity, unspecified: Secondary | ICD-10-CM

## 2012-06-22 DIAGNOSIS — E538 Deficiency of other specified B group vitamins: Secondary | ICD-10-CM

## 2012-06-22 LAB — POCT INR: INR: 2.3

## 2012-06-22 MED ORDER — OXYCODONE HCL 5 MG PO TABS: 5.0000 mg | ORAL_TABLET | Freq: Three times a day (TID) | ORAL | Status: DC | PRN

## 2012-06-22 MED ORDER — VASCULERA PO TABS: 1.0000 | ORAL_TABLET | Freq: Two times a day (BID) | ORAL | Status: DC

## 2012-06-22 NOTE — Assessment & Plan Note (Signed)
Continue with current prescription therapy as reflected on the Med list.  

## 2012-06-22 NOTE — Assessment & Plan Note (Signed)
ENT consult was offered  [employer]

## 2012-06-22 NOTE — Assessment & Plan Note (Signed)
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

## 2012-06-22 NOTE — Assessment & Plan Note (Signed)
Vasculera to try 

## 2012-06-22 NOTE — Progress Notes (Signed)
   Subjective:    HPI  C/o sinusitis - took Levaquin x 2 courses - not better  The patient is here to follow up on chronic severe to moderate LBP/OA symptoms controlled partially with medicines (oxycodone), hand pain, B12 def, h/o PE/DVT  C/o LBP and LLE pain 5-6/10; it is still 10/10 in am   BP Readings from Last 3 Encounters:  06/22/12 120/72  03/22/12 138/80  12/18/11 130/90   Wt Readings from Last 3 Encounters:  06/22/12 300 lb 4 oz (136.193 kg)  03/22/12 291 lb (131.997 kg)  12/18/11 292 lb (132.45 kg)      Review of Systems  Constitutional: Positive for fatigue.  HENT: Positive for congestion, rhinorrhea and postnasal drip. Negative for sinus pressure.   Respiratory: Positive for cough. Negative for chest tightness, shortness of breath and wheezing.   Cardiovascular: Positive for leg swelling.  Gastrointestinal: Negative for abdominal pain.  Genitourinary: Negative for frequency.  Musculoskeletal: Positive for back pain and arthralgias. Negative for myalgias.  Neurological: Negative for syncope.  Psychiatric/Behavioral: Positive for sleep disturbance. Negative for suicidal ideas and dysphoric mood. The patient is not nervous/anxious.        Objective:   Physical Exam  Constitutional: He is oriented to person, place, and time. He appears well-developed.       obese  HENT:  Mouth/Throat: Oropharynx is clear and moist.  Eyes: Conjunctivae normal are normal. Pupils are equal, round, and reactive to light.  Neck: Normal range of motion. No JVD present. No thyromegaly present.  Cardiovascular: Normal rate, regular rhythm, normal heart sounds and intact distal pulses.  Exam reveals no gallop and no friction rub.   No murmur heard. Pulmonary/Chest: Effort normal and breath sounds normal. No respiratory distress. He has no wheezes. He has no rales. He exhibits no tenderness.  Abdominal: Soft. Bowel sounds are normal. He exhibits no distension and no mass. There is no  tenderness. There is no rebound and no guarding.  Musculoskeletal: Normal range of motion. He exhibits edema and tenderness (LS spine).  Lymphadenopathy:    He has no cervical adenopathy.  Neurological: He is alert and oriented to person, place, and time. He has normal reflexes. No cranial nerve deficit. He exhibits normal muscle tone. Coordination normal.  Skin: Skin is warm and dry. No rash noted.  Psychiatric: He has a normal mood and affect. His behavior is normal. Judgment and thought content normal.    Lab Results  Component Value Date   WBC 9.4 02/25/2011   HGB 15.4 02/25/2011   HCT 44.5 02/25/2011   PLT 275.0 02/25/2011   GLUCOSE 100* 03/17/2012   CHOL 221* 07/15/2010   TRIG 205.0* 07/15/2010   HDL 30.80* 07/15/2010   LDLDIRECT 159.7 07/15/2010   ALT 30 10/09/2011   AST 26 10/09/2011   NA 138 03/17/2012   K 5.4* 03/17/2012   CL 102 03/17/2012   CREATININE 1.1 03/17/2012   BUN 16 03/17/2012   CO2 28 03/17/2012   TSH 3.64 02/25/2011   PSA 0.11 07/15/2010   INR 2.1 05/03/2012   HGBA1C 5.3 04/19/2007         Assessment & Plan:

## 2012-06-22 NOTE — Assessment & Plan Note (Addendum)
Worse - discussed diet

## 2012-06-26 ENCOUNTER — Other Ambulatory Visit: Payer: Self-pay | Admitting: Internal Medicine

## 2012-08-03 ENCOUNTER — Ambulatory Visit (INDEPENDENT_AMBULATORY_CARE_PROVIDER_SITE_OTHER): Payer: 59 | Admitting: General Practice

## 2012-08-03 DIAGNOSIS — Z7901 Long term (current) use of anticoagulants: Secondary | ICD-10-CM

## 2012-08-03 DIAGNOSIS — I80299 Phlebitis and thrombophlebitis of other deep vessels of unspecified lower extremity: Secondary | ICD-10-CM

## 2012-08-03 DIAGNOSIS — I2699 Other pulmonary embolism without acute cor pulmonale: Secondary | ICD-10-CM

## 2012-08-03 LAB — POCT INR: INR: 2.1

## 2012-08-30 ENCOUNTER — Other Ambulatory Visit: Payer: Self-pay | Admitting: Internal Medicine

## 2012-09-14 ENCOUNTER — Ambulatory Visit (INDEPENDENT_AMBULATORY_CARE_PROVIDER_SITE_OTHER): Payer: 59 | Admitting: General Practice

## 2012-09-14 DIAGNOSIS — I2699 Other pulmonary embolism without acute cor pulmonale: Secondary | ICD-10-CM

## 2012-09-14 DIAGNOSIS — Z7901 Long term (current) use of anticoagulants: Secondary | ICD-10-CM

## 2012-09-14 DIAGNOSIS — I80299 Phlebitis and thrombophlebitis of other deep vessels of unspecified lower extremity: Secondary | ICD-10-CM

## 2012-09-14 LAB — POCT INR: INR: 2.5

## 2012-09-16 ENCOUNTER — Encounter: Payer: Self-pay | Admitting: Internal Medicine

## 2012-09-16 ENCOUNTER — Ambulatory Visit (INDEPENDENT_AMBULATORY_CARE_PROVIDER_SITE_OTHER): Payer: 59 | Admitting: Internal Medicine

## 2012-09-16 VITALS — BP 140/90 | HR 80 | Temp 97.9°F | Resp 16 | Wt 302.0 lb

## 2012-09-16 DIAGNOSIS — Z23 Encounter for immunization: Secondary | ICD-10-CM

## 2012-09-16 DIAGNOSIS — M1810 Unilateral primary osteoarthritis of first carpometacarpal joint, unspecified hand: Secondary | ICD-10-CM

## 2012-09-16 DIAGNOSIS — E538 Deficiency of other specified B group vitamins: Secondary | ICD-10-CM

## 2012-09-16 DIAGNOSIS — Z7901 Long term (current) use of anticoagulants: Secondary | ICD-10-CM

## 2012-09-16 DIAGNOSIS — Z0000 Encounter for general adult medical examination without abnormal findings: Secondary | ICD-10-CM

## 2012-09-16 DIAGNOSIS — I2699 Other pulmonary embolism without acute cor pulmonale: Secondary | ICD-10-CM

## 2012-09-16 DIAGNOSIS — M545 Low back pain, unspecified: Secondary | ICD-10-CM

## 2012-09-16 DIAGNOSIS — L219 Seborrheic dermatitis, unspecified: Secondary | ICD-10-CM

## 2012-09-16 DIAGNOSIS — M19049 Primary osteoarthritis, unspecified hand: Secondary | ICD-10-CM

## 2012-09-16 DIAGNOSIS — E669 Obesity, unspecified: Secondary | ICD-10-CM

## 2012-09-16 MED ORDER — FUROSEMIDE 20 MG PO TABS: 20.0000 mg | ORAL_TABLET | ORAL | Status: DC

## 2012-09-16 MED ORDER — OXYCODONE HCL 5 MG PO TABS: 5.0000 mg | ORAL_TABLET | Freq: Three times a day (TID) | ORAL | Status: DC | PRN

## 2012-09-16 MED ORDER — NORTRIPTYLINE HCL 10 MG PO CAPS: 10.0000 mg | ORAL_CAPSULE | Freq: Every day | ORAL | Status: DC

## 2012-09-16 MED ORDER — CLOTRIMAZOLE-BETAMETHASONE 1-0.05 % EX CREA: TOPICAL_CREAM | Freq: Two times a day (BID) | CUTANEOUS | Status: DC

## 2012-09-16 MED ORDER — WARFARIN SODIUM 6 MG PO TABS: 6.0000 mg | ORAL_TABLET | Freq: Every day | ORAL | Status: DC

## 2012-09-16 NOTE — Assessment & Plan Note (Signed)
Better  

## 2012-09-16 NOTE — Assessment & Plan Note (Signed)
Continue with current prescription therapy as reflected on the Med list.  

## 2012-09-16 NOTE — Assessment & Plan Note (Signed)
Worse  Wt Readings from Last 3 Encounters:  09/16/12 302 lb (136.986 kg)  06/22/12 300 lb 4 oz (136.193 kg)  03/22/12 291 lb (131.997 kg)

## 2012-09-16 NOTE — Assessment & Plan Note (Signed)
Lotrisone prn 

## 2012-09-16 NOTE — Progress Notes (Signed)
   Subjective:    HPI    The patient is here to follow up on chronic severe to moderate LBP/OA symptoms controlled partially with medicines (oxycodone), hand pain, B12 def, h/o PE/DVT  C/o LBP and LLE pain 5-6/10; it is still 10/10 in am   C/o SD rash at times  BP Readings from Last 3 Encounters:  09/16/12 140/90  06/22/12 120/72  03/22/12 138/80   Wt Readings from Last 3 Encounters:  09/16/12 302 lb (136.986 kg)  06/22/12 300 lb 4 oz (136.193 kg)  03/22/12 291 lb (131.997 kg)      Review of Systems  Constitutional: Positive for fatigue.  HENT: Positive for congestion, rhinorrhea and postnasal drip. Negative for sinus pressure.   Respiratory: Positive for cough. Negative for chest tightness, shortness of breath and wheezing.   Cardiovascular: Positive for leg swelling.  Gastrointestinal: Negative for abdominal pain.  Genitourinary: Negative for frequency.  Musculoskeletal: Positive for back pain and arthralgias. Negative for myalgias.  Neurological: Negative for syncope.  Psychiatric/Behavioral: Positive for sleep disturbance. Negative for suicidal ideas and dysphoric mood. The patient is not nervous/anxious.        Objective:   Physical Exam  Constitutional: He is oriented to person, place, and time. He appears well-developed.  obese  HENT:  Mouth/Throat: Oropharynx is clear and moist.  Eyes: Conjunctivae are normal. Pupils are equal, round, and reactive to light.  Neck: Normal range of motion. No JVD present. No thyromegaly present.  Cardiovascular: Normal rate, regular rhythm, normal heart sounds and intact distal pulses.  Exam reveals no gallop and no friction rub.   No murmur heard. Pulmonary/Chest: Effort normal and breath sounds normal. No respiratory distress. He has no wheezes. He has no rales. He exhibits no tenderness.  Abdominal: Soft. Bowel sounds are normal. He exhibits no distension and no mass. There is no tenderness. There is no rebound and no  guarding.  Musculoskeletal: Normal range of motion. He exhibits edema and tenderness (LS spine).  Lymphadenopathy:    He has no cervical adenopathy.  Neurological: He is alert and oriented to person, place, and time. He has normal reflexes. No cranial nerve deficit. He exhibits normal muscle tone. Coordination normal.  Skin: Skin is warm and dry. No rash noted.  Psychiatric: He has a normal mood and affect. His behavior is normal. Judgment and thought content normal.    Lab Results  Component Value Date   WBC 9.4 02/25/2011   HGB 15.4 02/25/2011   HCT 44.5 02/25/2011   PLT 275.0 02/25/2011   GLUCOSE 100* 03/17/2012   CHOL 221* 07/15/2010   TRIG 205.0* 07/15/2010   HDL 30.80* 07/15/2010   LDLDIRECT 159.7 07/15/2010   ALT 30 10/09/2011   AST 26 10/09/2011   NA 138 03/17/2012   K 5.4* 03/17/2012   CL 102 03/17/2012   CREATININE 1.1 03/17/2012   BUN 16 03/17/2012   CO2 28 03/17/2012   TSH 3.64 02/25/2011   PSA 0.11 07/15/2010   INR 2.5 09/14/2012   HGBA1C 5.3 04/19/2007         Assessment & Plan:

## 2012-09-20 ENCOUNTER — Ambulatory Visit: Payer: BC Managed Care – PPO | Admitting: Internal Medicine

## 2012-10-11 ENCOUNTER — Encounter: Payer: Self-pay | Admitting: Internal Medicine

## 2012-10-15 ENCOUNTER — Other Ambulatory Visit: Payer: Self-pay | Admitting: General Practice

## 2012-10-15 ENCOUNTER — Telehealth: Payer: Self-pay | Admitting: General Practice

## 2012-10-15 ENCOUNTER — Encounter (HOSPITAL_COMMUNITY): Payer: Self-pay | Admitting: Pharmacy Technician

## 2012-10-15 MED ORDER — ENOXAPARIN SODIUM 150 MG/ML ~~LOC~~ SOLN: 1.0000 mg/kg | Freq: Two times a day (BID) | SUBCUTANEOUS | Status: DC

## 2012-10-15 NOTE — Telephone Encounter (Signed)
Opened in error

## 2012-10-15 NOTE — Telephone Encounter (Signed)
Message copied by Garrison Columbus on Fri Oct 15, 2012  1:23 PM ------      Message from: Tresa Garter      Created: Fri Oct 15, 2012 12:09 PM       Yes, please      Thx      ----- Message -----         From: Garrison Columbus, RN         Sent: 10/15/2012  11:50 AM           To: Tresa Garter, MD            Patient is having knee surgery on 5/2.  Should he be covered with a lovenox bridge?  Thanks, Arline Asp       ------

## 2012-10-15 NOTE — Telephone Encounter (Signed)
Instructions for coumadin and Lovenox per and post surgery on 5/2. 4/27 - Take last dose of coumadin until after surgery 4/28 - Nothing (No coumadin and No Lovenox) 4/29 - Take Lovenox in AM and PM (No coumadin) 4/30 - Take Lovenox in AM and PM (No coumadin) 5/1 - Take Lovenox in AM only (No coumadin) 5/2 - Procedure (Do not take Lovenox or coumadin) 5/3 - Take Lovenox in AM and PM and take 9 mg of coumadin 5/4 - Take Lovenox in AM and PM and take 9 mg of coumadin 5/5 - Take Lovenox in AM and PM and take 6 mg of coumadin 5/6 - Take Lovenox in AM and PM and take 6 mg of coumadin 5/7 - Check INR

## 2012-10-18 ENCOUNTER — Other Ambulatory Visit: Payer: Self-pay | Admitting: Orthopedic Surgery

## 2012-10-19 ENCOUNTER — Encounter (HOSPITAL_COMMUNITY): Payer: Self-pay

## 2012-10-19 ENCOUNTER — Other Ambulatory Visit (HOSPITAL_COMMUNITY): Payer: Self-pay | Admitting: *Deleted

## 2012-10-19 ENCOUNTER — Ambulatory Visit (HOSPITAL_COMMUNITY)
Admission: RE | Admit: 2012-10-19 | Discharge: 2012-10-19 | Disposition: A | Discharge status: Home / Self Care | Payer: 59 | Source: Ambulatory Visit | Attending: Specialist | Admitting: Specialist

## 2012-10-19 ENCOUNTER — Ambulatory Visit: Payer: 59 | Admitting: Internal Medicine

## 2012-10-19 ENCOUNTER — Encounter (HOSPITAL_COMMUNITY)
Admission: RE | Admit: 2012-10-19 | Discharge: 2012-10-19 | Disposition: A | Discharge status: Home / Self Care | Payer: 59 | Source: Ambulatory Visit | Attending: Specialist | Admitting: Specialist

## 2012-10-19 DIAGNOSIS — Z01810 Encounter for preprocedural cardiovascular examination: Secondary | ICD-10-CM | POA: Insufficient documentation

## 2012-10-19 DIAGNOSIS — Z86711 Personal history of pulmonary embolism: Secondary | ICD-10-CM | POA: Insufficient documentation

## 2012-10-19 DIAGNOSIS — Z01818 Encounter for other preprocedural examination: Secondary | ICD-10-CM | POA: Insufficient documentation

## 2012-10-19 HISTORY — DX: Anxiety disorder, unspecified: F41.9

## 2012-10-19 HISTORY — DX: Other complications of anesthesia, initial encounter: T88.59XA

## 2012-10-19 HISTORY — DX: Adverse effect of unspecified anesthetic, initial encounter: T41.45XA

## 2012-10-19 HISTORY — DX: Peripheral vascular disease, unspecified: I73.9

## 2012-10-19 HISTORY — DX: Headache: R51

## 2012-10-19 HISTORY — DX: Cyst of kidney, acquired: N28.1

## 2012-10-19 HISTORY — DX: Pain in unspecified knee: M25.569

## 2012-10-19 LAB — BASIC METABOLIC PANEL
BUN: 15 mg/dL (ref 6–23)
CO2: 28 mEq/L (ref 19–32)
Calcium: 9.5 mg/dL (ref 8.4–10.5)
Chloride: 99 mEq/L (ref 96–112)
Creatinine, Ser: 0.92 mg/dL (ref 0.50–1.35)
GFR calc Af Amer: >90 mL/min (ref >90)
GFR calc non Af Amer: 89 mL/min — ABNORMAL LOW (ref >90)
Glucose, Bld: 99 mg/dL (ref 70–99)
Potassium: 4.4 mEq/L (ref 3.5–5.1)
Sodium: 137 mEq/L (ref 135–145)

## 2012-10-19 LAB — CBC
HCT: 45.4 % (ref 39.0–52.0)
Hemoglobin: 16.4 g/dL (ref 13.0–17.0)
MCH: 32.3 pg (ref 26.0–34.0)
MCHC: 36.1 g/dL — ABNORMAL HIGH (ref 30.0–36.0)
MCV: 89.4 fL (ref 78.0–100.0)
Platelets: 331 10*3/uL (ref 150–400)
RBC: 5.08 MIL/uL (ref 4.22–5.81)
RDW: 12.5 % (ref 11.5–15.5)
WBC: 9.4 10*3/uL (ref 4.0–10.5)

## 2012-10-19 LAB — SURGICAL PCR SCREEN
MRSA, PCR: NEGATIVE
Staphylococcus aureus: POSITIVE — AB

## 2012-10-19 LAB — PROTIME-INR
INR: 1.9 — ABNORMAL HIGH (ref 0.00–1.49)
Prothrombin Time: 21.1 seconds — ABNORMAL HIGH (ref 11.6–15.2)

## 2012-10-19 LAB — APTT: aPTT: 41 seconds — ABNORMAL HIGH (ref 24–37)

## 2012-10-19 NOTE — Patient Instructions (Addendum)
Demir [place]  10/19/2012                           YOUR PROCEDURE IS SCHEDULED ON:  10/22/12               PLEASE REPORT TO SHORT STAY CENTER AT : 5:15 AM               CALL THIS NUMBER IF ANY PROBLEMS THE DAY OF SURGERY :               [phone]                      REMEMBER:   Do not eat food or drink liquids AFTER MIDNIGHT   Take these medicines the morning of surgery with A SIP OF WATER: ZYRTEC / MAY USE ALBUTEROL IF NEEDED / MAY TAKE OXYCODONE IF NEEDED    Do not wear jewelry, make-up   Do not wear lotions, powders, or perfumes.   Do not shave legs or underarms 12 hrs. before surgery (men may shave face)  Do not bring valuables to the hospital.  Contacts, dentures or bridgework may not be worn into surgery.  Leave suitcase in the car. After surgery it may be brought to your room.  For patients admitted to the hospital more than one night, checkout time is 11:00                          The day of discharge.   Patients discharged the day of surgery will not be allowed to drive home                             If going home same day of surgery, must have someone stay with you first                           24 hrs at home and arrange for some one to drive you home from hospital.    Special Instructions:   Please read over the following fact sheets that you were given:               1. MRSA  INFORMATION                      2. [facility] PREPARING FOR SURGERY SHEET               3. INCENTIVE SPIROMETER                                                X_____________________________________________________________________        Failure to follow these instructions may result in cancellation of your surgery

## 2012-10-19 NOTE — Progress Notes (Signed)
10/19/12 1007  OBSTRUCTIVE SLEEP APNEA  Have you ever been diagnosed with sleep apnea through a sleep study? Yes  Do you snore loudly (loud enough to be heard through closed doors)?  1  Do you often feel tired, fatigued, or sleepy during the daytime? 1  Has anyone observed you stop breathing during your sleep? 1  Do you have, or are you being treated for high blood pressure? 0  BMI more than 35 kg/m2? 1  Age over 61 years old? 1  Neck circumference greater than 40 cm/18 inches? 1  Gender: 1  Obstructive Sleep Apnea Score 7  Score 4 or greater  Results sent to PCP

## 2012-10-20 ENCOUNTER — Other Ambulatory Visit: Payer: Self-pay | Admitting: Orthopedic Surgery

## 2012-10-21 ENCOUNTER — Other Ambulatory Visit: Payer: Self-pay | Admitting: Orthopedic Surgery

## 2012-10-21 NOTE — H&P (Signed)
Robert [name] is an 62 y.o. male.   Chief Complaint: left knee pain HPI: 18 weeks s/p L knee injury with pain, swelling, ambulatory dysfunction. Symptoms refractory to steroid injections, viscosupplementation, PT, HEP, quad strengthening, activity modifications, ice and elevation. Prior hx L knee scope by Dr. Aplington.  Past Medical History  Diagnosis Date  . Warfarin anticoagulation   . LBP (low back pain)   . History of pulmonary embolism     2003  . Asthma   . Edema     Right Leg w/ h/o DVT postphlebitic  . Hyperlipidemia   . Osteoarthritis   . Obesity   . Complication of anesthesia     SLOW TO WAKE UP / DIFFICULTY RESPONDING 2003  . Headache     MIGRAINES  . Knee pain     left  . Peripheral vascular disease     "poor circulation in  RT leg"  . Cyst of left kidney   . Anxiety     Past Surgical History  Procedure Laterality Date  . Lumbar fusion      Dr Bean  . Appendectomy      Dr Streck  . Cholecystectomy    . Orchiectomy  1994    R post injury  . Tonsillectomy    . Knee arthroscopy  1990    RT KNEE  . Knee arthroscopy  2003  . Knee arthroscopy  1988    L KNEE  . Finger surgery      RT THUMB RECONSTRUCTION    Family History  Problem Relation Age of Onset  . Hyperlipidemia Other   . Hypertension Other   . Parkinsonism Other   . Heart disease Mother     CABG at age 81   Social History:  reports that he has never smoked. He has never used smokeless tobacco. He reports that he does not drink alcohol or use illicit drugs.  Allergies:  Allergies  Allergen Reactions  . Exalgo (Hydromorphone Hcl) Nausea Only    Sick and nauseated  . Fentanyl Other (See Comments)    REACTION: too sleepy  . Hydrocodone-Acetaminophen Nausea Only    REACTION: nausea  . Oxycodone Hcl Nausea Only    REACTION: nausea  . Penicillins   . Sulfonamide Derivatives   . Tramadol Hcl     REACTION: did not help     (Not in a hospital admission)  No results found for this  or any previous visit (from the past 48 hour(s)). No results found.  Review of Systems  Constitutional: Negative.   HENT: Negative.   Eyes: Negative.   Respiratory: Negative.   Cardiovascular: Positive for leg swelling.  Gastrointestinal: Negative.   Genitourinary: Negative.   Musculoskeletal: Positive for joint pain.  Skin: Negative.   Neurological: Negative.   Psychiatric/Behavioral: Negative.     There were no vitals taken for this visit. Physical Exam  Constitutional: He is oriented to person, place, and time. He appears well-developed and well-nourished. He appears distressed.  HENT:  Head: Normocephalic and atraumatic.  Eyes: Conjunctivae and EOM are normal. Pupils are equal, round, and reactive to light.  Neck: Normal range of motion. Neck supple.  Cardiovascular: Normal rate and regular rhythm.   Respiratory: Effort normal and breath sounds normal.  GI: Soft. Bowel sounds are normal.  Musculoskeletal:   Knee exam on inspection reveals he has a trace effusion. He is tender in the medial joint line. He is also tender at the insertion of the pes anserinus   bursa. He is walking better at this point. No patellofemoral pain with compression. Nontender over the fibular head or the peroneal nerve. Nontender over the quadriceps insertion of the patellar ligament insertion. The range of motion was full. Provocative maneuvers revealed a negative Lachman, negative anterior and posterior drawer and a negative McMurray. No instability was noted with varus and valgus stressing at 0 or 30 degrees. On manual motor test the quadriceps and hamstrings were 5/5. Sensory exam was intact to light touch.  Neurological: He is alert and oriented to person, place, and time. He has normal reflexes.  Skin: Skin is warm and dry.  Psychiatric: He has a normal mood and affect.    AP standing and lateral demonstrates moderate medial joint space narrowing.  MRI demonstrates a horizontal tear of the  meniscus although it is not displaced.   Assessment/Plan L knee MMT, DJD  Plan L knee arthroscopy, partial medial meniscectomy, debridement. Dr. Beane has previously discussed risks, complications, alternatives with the pt including but not limited to DVT, PE, infx, bleeding, failure of procedure, need for secondary procedure, anesthesia risk, even death. All questions answered and pt desires to proceed. He is on chronic coumadin, will bridge with Lovenox.  [name] for Dr. Beane 10/21/2012, 9:46 PM    

## 2012-10-22 ENCOUNTER — Encounter (HOSPITAL_COMMUNITY): Payer: Self-pay | Admitting: *Deleted

## 2012-10-22 ENCOUNTER — Ambulatory Visit (HOSPITAL_COMMUNITY): Payer: 59 | Admitting: *Deleted

## 2012-10-22 ENCOUNTER — Encounter (HOSPITAL_COMMUNITY)
Admission: RE | Disposition: A | Discharge status: Home / Self Care | Payer: Self-pay | Source: Ambulatory Visit | Attending: Specialist

## 2012-10-22 ENCOUNTER — Encounter (HOSPITAL_COMMUNITY): Payer: Self-pay

## 2012-10-22 ENCOUNTER — Ambulatory Visit (HOSPITAL_COMMUNITY)
Admission: RE | Admit: 2012-10-22 | Discharge: 2012-10-22 | Disposition: A | Discharge status: Home / Self Care | Payer: 59 | Source: Ambulatory Visit | Attending: Specialist | Admitting: Specialist

## 2012-10-22 DIAGNOSIS — Z86711 Personal history of pulmonary embolism: Secondary | ICD-10-CM | POA: Insufficient documentation

## 2012-10-22 DIAGNOSIS — IMO0002 Reserved for concepts with insufficient information to code with codable children: Secondary | ICD-10-CM | POA: Insufficient documentation

## 2012-10-22 DIAGNOSIS — M1990 Unspecified osteoarthritis, unspecified site: Secondary | ICD-10-CM | POA: Insufficient documentation

## 2012-10-22 DIAGNOSIS — Q619 Cystic kidney disease, unspecified: Secondary | ICD-10-CM | POA: Insufficient documentation

## 2012-10-22 DIAGNOSIS — X58XXXA Exposure to other specified factors, initial encounter: Secondary | ICD-10-CM | POA: Insufficient documentation

## 2012-10-22 DIAGNOSIS — Z880 Allergy status to penicillin: Secondary | ICD-10-CM | POA: Insufficient documentation

## 2012-10-22 DIAGNOSIS — E669 Obesity, unspecified: Secondary | ICD-10-CM | POA: Insufficient documentation

## 2012-10-22 DIAGNOSIS — I739 Peripheral vascular disease, unspecified: Secondary | ICD-10-CM | POA: Insufficient documentation

## 2012-10-22 DIAGNOSIS — S83289A Other tear of lateral meniscus, current injury, unspecified knee, initial encounter: Secondary | ICD-10-CM | POA: Insufficient documentation

## 2012-10-22 DIAGNOSIS — M545 Low back pain, unspecified: Secondary | ICD-10-CM | POA: Insufficient documentation

## 2012-10-22 DIAGNOSIS — Z86718 Personal history of other venous thrombosis and embolism: Secondary | ICD-10-CM | POA: Insufficient documentation

## 2012-10-22 DIAGNOSIS — F411 Generalized anxiety disorder: Secondary | ICD-10-CM | POA: Insufficient documentation

## 2012-10-22 DIAGNOSIS — E785 Hyperlipidemia, unspecified: Secondary | ICD-10-CM | POA: Insufficient documentation

## 2012-10-22 DIAGNOSIS — Z882 Allergy status to sulfonamides status: Secondary | ICD-10-CM | POA: Insufficient documentation

## 2012-10-22 DIAGNOSIS — J45909 Unspecified asthma, uncomplicated: Secondary | ICD-10-CM | POA: Insufficient documentation

## 2012-10-22 DIAGNOSIS — Z885 Allergy status to narcotic agent status: Secondary | ICD-10-CM | POA: Insufficient documentation

## 2012-10-22 DIAGNOSIS — G43909 Migraine, unspecified, not intractable, without status migrainosus: Secondary | ICD-10-CM | POA: Insufficient documentation

## 2012-10-22 DIAGNOSIS — Z7901 Long term (current) use of anticoagulants: Secondary | ICD-10-CM | POA: Insufficient documentation

## 2012-10-22 HISTORY — PX: KNEE ARTHROSCOPY WITH MENISCAL REPAIR: SHX5653

## 2012-10-22 LAB — PROTIME-INR
INR: 1.1 (ref 0.00–1.49)
Prothrombin Time: 14.1 seconds (ref 11.6–15.2)

## 2012-10-22 SURGERY — ARTHROSCOPY, KNEE, WITH MENISCUS REPAIR
Anesthesia: General | Site: Knee | Laterality: Left | Wound class: Clean

## 2012-10-22 MED ORDER — LACTATED RINGERS IR SOLN
Status: DC | PRN
  Administered 2012-10-22: 6000 mL

## 2012-10-22 MED ORDER — BUPIVACAINE-EPINEPHRINE 0.5% -1:200000 IJ SOLN
INTRAMUSCULAR | Status: DC | PRN
  Administered 2012-10-22: 15 mL

## 2012-10-22 MED ORDER — LACTATED RINGERS IV SOLN: INTRAVENOUS | Status: DC

## 2012-10-22 MED ORDER — BUPIVACAINE-EPINEPHRINE (PF) 0.5% -1:200000 IJ SOLN
INTRAMUSCULAR | Status: AC
  Filled 2012-10-22: qty 10

## 2012-10-22 MED ORDER — CEFAZOLIN SODIUM 1-5 GM-% IV SOLN
INTRAVENOUS | Status: AC
  Filled 2012-10-22: qty 50

## 2012-10-22 MED ORDER — FENTANYL CITRATE 0.05 MG/ML IJ SOLN
INTRAMUSCULAR | Status: DC | PRN
  Administered 2012-10-22 (×4): 50 ug via INTRAVENOUS

## 2012-10-22 MED ORDER — EPINEPHRINE HCL 1 MG/ML IJ SOLN
INTRAMUSCULAR | Status: AC
  Filled 2012-10-22: qty 2

## 2012-10-22 MED ORDER — DEXTROSE 5 % IV SOLN
3.0000 g | INTRAVENOUS | Status: AC
  Administered 2012-10-22: 3 g via INTRAVENOUS

## 2012-10-22 MED ORDER — CHLORHEXIDINE GLUCONATE 4 % EX LIQD
60.0000 mL | Freq: Once | CUTANEOUS | Status: DC
  Filled 2012-10-22: qty 60

## 2012-10-22 MED ORDER — HYDROMORPHONE HCL PF 1 MG/ML IJ SOLN: 0.2500 mg | INTRAMUSCULAR | Status: DC | PRN

## 2012-10-22 MED ORDER — PROMETHAZINE HCL 25 MG/ML IJ SOLN: 6.2500 mg | INTRAMUSCULAR | Status: DC | PRN

## 2012-10-22 MED ORDER — ONDANSETRON HCL 4 MG/2ML IJ SOLN
INTRAMUSCULAR | Status: DC | PRN
  Administered 2012-10-22 (×2): 2 mg via INTRAVENOUS

## 2012-10-22 MED ORDER — PROPOFOL 10 MG/ML IV EMUL
INTRAVENOUS | Status: DC | PRN
  Administered 2012-10-22: 200 mg via INTRAVENOUS

## 2012-10-22 MED ORDER — OXYCODONE-ACETAMINOPHEN 7.5-325 MG PO TABS: 1.0000 | ORAL_TABLET | ORAL | Status: DC | PRN

## 2012-10-22 MED ORDER — CEFAZOLIN SODIUM-DEXTROSE 2-3 GM-% IV SOLR
INTRAVENOUS | Status: AC
  Filled 2012-10-22: qty 50

## 2012-10-22 MED ORDER — LIDOCAINE HCL (CARDIAC) 20 MG/ML IV SOLN
INTRAVENOUS | Status: DC | PRN
  Administered 2012-10-22: 75 mg via INTRAVENOUS

## 2012-10-22 MED ORDER — OXYCODONE HCL 5 MG PO TABS: 5.0000 mg | ORAL_TABLET | ORAL | Status: DC | PRN

## 2012-10-22 MED ORDER — CLINDAMYCIN PHOSPHATE 900 MG/50ML IV SOLN
900.0000 mg | INTRAVENOUS | Status: AC
  Administered 2012-10-22: 900 mg via INTRAVENOUS

## 2012-10-22 MED ORDER — CLINDAMYCIN PHOSPHATE 900 MG/50ML IV SOLN
INTRAVENOUS | Status: AC
  Filled 2012-10-22: qty 50

## 2012-10-22 MED ORDER — EPINEPHRINE HCL 1 MG/ML IJ SOLN
INTRAMUSCULAR | Status: DC | PRN
  Administered 2012-10-22: 2 mL

## 2012-10-22 MED ORDER — LACTATED RINGERS IV SOLN
INTRAVENOUS | Status: DC
  Administered 2012-10-22 (×2): via INTRAVENOUS

## 2012-10-22 MED ORDER — OXYCODONE HCL 5 MG PO TABS
10.0000 mg | ORAL_TABLET | ORAL | Status: AC | PRN
  Administered 2012-10-22: 10 mg via ORAL
  Filled 2012-10-22: qty 2

## 2012-10-22 SURGICAL SUPPLY — 21 items
BLADE 4.2CUDA (BLADE) IMPLANT
BLADE CUDA SHAVER 3.5 (BLADE) ×2 IMPLANT
CLOTH 2% CHLOROHEXIDINE 3PK (PERSONAL CARE ITEMS) ×2 IMPLANT
CLOTH BEACON ORANGE TIMEOUT ST (SAFETY) ×2 IMPLANT
DRSG EMULSION OIL 3X3 NADH (GAUZE/BANDAGES/DRESSINGS) ×2 IMPLANT
DURAPREP 26ML APPLICATOR (WOUND CARE) ×2 IMPLANT
GLOVE BIOGEL PI IND STRL 7.5 (GLOVE) ×1 IMPLANT
GLOVE BIOGEL PI IND STRL 8 (GLOVE) ×1 IMPLANT
GLOVE BIOGEL PI INDICATOR 7.5 (GLOVE) ×1
GLOVE BIOGEL PI INDICATOR 8 (GLOVE) ×1
GLOVE SURG SS PI 7.5 STRL IVOR (GLOVE) ×2 IMPLANT
GLOVE SURG SS PI 8.0 STRL IVOR (GLOVE) ×4 IMPLANT
GOWN STRL REIN XL XLG (GOWN DISPOSABLE) ×5 IMPLANT
MANIFOLD NEPTUNE II (INSTRUMENTS) ×4 IMPLANT
MINI VAC (SURGICAL WAND) ×1 IMPLANT
PACK ARTHROSCOPY WL (CUSTOM PROCEDURE TRAY) ×2 IMPLANT
PADDING CAST COTTON 6X4 STRL (CAST SUPPLIES) ×2 IMPLANT
SET ARTHROSCOPY TUBING (MISCELLANEOUS) ×2
SET ARTHROSCOPY TUBING LN (MISCELLANEOUS) ×1 IMPLANT
SUT ETHILON 4 0 PS 2 18 (SUTURE) ×2 IMPLANT
WRAP KNEE MAXI GEL POST OP (GAUZE/BANDAGES/DRESSINGS) ×2 IMPLANT

## 2012-10-22 NOTE — Preoperative (Signed)
Beta Blockers   Reason not to administer Beta Blockers:Not Applicable 

## 2012-10-22 NOTE — Transfer of Care (Signed)
Immediate Anesthesia Transfer of Care Note  Patient: Robert [name]  Procedure(s) Performed: Procedure(s): LEFT KNEE ARTHROSCOPY PARTIAL MEDIAL AND LATERAL MENISCECTOMY AND DEBRIDEMENT, CHONDROPLASTY  (Left)  Patient Location: PACU  Anesthesia Type:General  Level of Consciousness: awake, oriented, patient cooperative, lethargic and responds to stimulation  Airway & Oxygen Therapy: Patient Spontanous Breathing and Patient connected to face mask oxygen  Post-op Assessment: Report given to PACU RN, Post -op Vital signs reviewed and stable and Patient moving all extremities  Post vital signs: Reviewed and stable  Complications: No apparent anesthesia complications

## 2012-10-22 NOTE — Progress Notes (Signed)
Patient states he is not allergic to Oxycodone but cannot take the Tylenol due to liver issues. Dr Shelle Iron paged for orders and different Rx for home.

## 2012-10-22 NOTE — Anesthesia Postprocedure Evaluation (Signed)
Anesthesia Post Note  Patient: Robert [name]  Procedure(s) Performed: Procedure(s) (LRB): LEFT KNEE ARTHROSCOPY PARTIAL MEDIAL AND LATERAL MENISCECTOMY AND DEBRIDEMENT, CHONDROPLASTY  (Left)  Anesthesia type: General  Patient location: PACU  Post pain: Pain level controlled  Post assessment: Post-op Vital signs reviewed  Last Vitals:  Filed Vitals:   10/22/12 1049  BP: 137/71  Pulse: 86  Temp:   Resp: 16    Post vital signs: Reviewed  Level of consciousness: sedated  Complications: No apparent anesthesia complications

## 2012-10-22 NOTE — Interval H&P Note (Signed)
History and Physical Interval Note:  10/22/2012 7:01 AM  Robert [name]  has presented today for surgery, with the diagnosis of LEFT KNEE MEDIAL MENISCUS TEAR DJD   The various methods of treatment have been discussed with the patient and family. After consideration of risks, benefits and other options for treatment, the patient has consented to  Procedure(s): LEFT KNEE ARTHROSCOPY PARTIAL MEDIAL MENISCECTOMY AND DEBRIDEMENT  (Left) as a surgical intervention .  The patient's history has been reviewed, patient examined, no change in status, stable for surgery.  I have reviewed the patient's chart and labs.  Questions were answered to the patient's satisfaction.     [name] C

## 2012-10-22 NOTE — H&P (View-Only) (Signed)
Robert [name] is an 62 y.o. male.   Chief Complaint: left knee pain HPI: 18 weeks s/p L knee injury with pain, swelling, ambulatory dysfunction. Symptoms refractory to steroid injections, viscosupplementation, PT, HEP, quad strengthening, activity modifications, ice and elevation. Prior hx L knee scope by Dr. Simonne Come.  Past Medical History  Diagnosis Date  . Warfarin anticoagulation   . LBP (low back pain)   . History of pulmonary embolism     2003  . Asthma   . Edema     Right Leg w/ h/o DVT postphlebitic  . Hyperlipidemia   . Osteoarthritis   . Obesity   . Complication of anesthesia     SLOW TO WAKE UP / DIFFICULTY RESPONDING 2003  . Headache     MIGRAINES  . Knee pain     left  . Peripheral vascular disease     "poor circulation in  RT leg"  . Cyst of left kidney   . Anxiety     Past Surgical History  Procedure Laterality Date  . Lumbar fusion      Dr Jillyn Hidden  . Appendectomy      Dr Jamey Ripa  . Cholecystectomy    . Orchiectomy  1994    R post injury  . Tonsillectomy    . Knee arthroscopy  1990    RT KNEE  . Knee arthroscopy  2003  . Knee arthroscopy  1988    L KNEE  . Finger surgery      RT THUMB RECONSTRUCTION    Family History  Problem Relation Age of Onset  . Hyperlipidemia Other   . Hypertension Other   . Parkinsonism Other   . Heart disease Mother     CABG at age 49   Social History:  reports that he has never smoked. He has never used smokeless tobacco. He reports that he does not drink alcohol or use illicit drugs.  Allergies:  Allergies  Allergen Reactions  . Exalgo (Hydromorphone Hcl) Nausea Only    Sick and nauseated  . Fentanyl Other (See Comments)    REACTION: too sleepy  . Hydrocodone-Acetaminophen Nausea Only    REACTION: nausea  . Oxycodone Hcl Nausea Only    REACTION: nausea  . Penicillins   . Sulfonamide Derivatives   . Tramadol Hcl     REACTION: did not help     (Not in a hospital admission)  No results found for this  or any previous visit (from the past 48 hour(s)). No results found.  Review of Systems  Constitutional: Negative.   HENT: Negative.   Eyes: Negative.   Respiratory: Negative.   Cardiovascular: Positive for leg swelling.  Gastrointestinal: Negative.   Genitourinary: Negative.   Musculoskeletal: Positive for joint pain.  Skin: Negative.   Neurological: Negative.   Psychiatric/Behavioral: Negative.     There were no vitals taken for this visit. Physical Exam  Constitutional: He is oriented to person, place, and time. He appears well-developed and well-nourished. He appears distressed.  HENT:  Head: Normocephalic and atraumatic.  Eyes: Conjunctivae and EOM are normal. Pupils are equal, round, and reactive to light.  Neck: Normal range of motion. Neck supple.  Cardiovascular: Normal rate and regular rhythm.   Respiratory: Effort normal and breath sounds normal.  GI: Soft. Bowel sounds are normal.  Musculoskeletal:   Knee exam on inspection reveals he has a trace effusion. He is tender in the medial joint line. He is also tender at the insertion of the pes anserinus  bursa. He is walking better at this point. No patellofemoral pain with compression. Nontender over the fibular head or the peroneal nerve. Nontender over the quadriceps insertion of the patellar ligament insertion. The range of motion was full. Provocative maneuvers revealed a negative Lachman, negative anterior and posterior drawer and a negative McMurray. No instability was noted with varus and valgus stressing at 0 or 30 degrees. On manual motor test the quadriceps and hamstrings were 5/5. Sensory exam was intact to light touch.  Neurological: He is alert and oriented to person, place, and time. He has normal reflexes.  Skin: Skin is warm and dry.  Psychiatric: He has a normal mood and affect.    AP standing and lateral demonstrates moderate medial joint space narrowing.  MRI demonstrates a horizontal tear of the  meniscus although it is not displaced.   Assessment/Plan L knee MMT, DJD  Plan L knee arthroscopy, partial medial meniscectomy, debridement. Dr. Shelle Iron has previously discussed risks, complications, alternatives with the pt including but not limited to DVT, PE, infx, bleeding, failure of procedure, need for secondary procedure, anesthesia risk, even death. All questions answered and pt desires to proceed. He is on chronic coumadin, will bridge with Lovenox.  Andrez Grime M. for Dr. Shelle Iron 10/21/2012, 9:46 PM

## 2012-10-22 NOTE — Brief Op Note (Signed)
10/22/2012  8:31 AM  PATIENT:  Robert [name]  62 y.o. male  PRE-OPERATIVE DIAGNOSIS:  LEFT KNEE MEDIAL MENISCUS TEAR, DEGENERATIVE JOINT DISEASE   POST-OPERATIVE DIAGNOSIS:  LEFT KNEE MEDIAL MENISCUS TEAR, DEGENERATIVE JOINT, TORN LATERAL MENISCUS DISEASE   PROCEDURE:  Procedure(s): LEFT KNEE ARTHROSCOPY PARTIAL MEDIAL AND LATERAL MENISCECTOMY AND DEBRIDEMENT, CHONDROPLASTY  (Left)  SURGEON:  Surgeon(s) and Role:    * Javier Docker, MD - Primary  PHYSICIAN ASSISTANT:   ASSISTANTS: Bissell   ANESTHESIA:   general  EBL:     BLOOD ADMINISTERED:none  DRAINS: none   LOCAL MEDICATIONS USED:  MARCAINE     SPECIMEN:  No Specimen  DISPOSITION OF SPECIMEN:  N/A  COUNTS:  YES  TOURNIQUET:  * No tourniquets in log *  DICTATION: .Other Dictation: Dictation Number [id number]  PLAN OF CARE: Discharge to home after PACU  PATIENT DISPOSITION:  PACU - hemodynamically stable.   Delay start of Pharmacological VTE agent (>24hrs) due to surgical blood loss or risk of bleeding: no

## 2012-10-22 NOTE — Anesthesia Preprocedure Evaluation (Signed)
Anesthesia Evaluation  Patient identified by MRN, date of birth, ID band Patient awake    Reviewed: Allergy & Precautions, H&P , NPO status , Patient's Chart, lab work & pertinent test results  Airway Mallampati: II TM Distance: >3 FB Neck ROM: Full    Dental  (+) Teeth Intact and Dental Advisory Given   Pulmonary neg pulmonary ROS, asthma ,  Hx of PE breath sounds clear to auscultation  Pulmonary exam normal       Cardiovascular + Peripheral Vascular Disease and DVT Rhythm:Regular Rate:Normal     Neuro/Psych  Headaches, Anxiety Chronic back pain; patient describes hx of PTSD negative psych ROS   GI/Hepatic negative GI ROS, Neg liver ROS,   Endo/Other  negative endocrine ROSMorbid obesity  Renal/GU Renal disease  negative genitourinary   Musculoskeletal negative musculoskeletal ROS (+)   Abdominal (+) + obese,   Peds  Hematology  (+) Blood dyscrasia, , Chronic Coumadin therapy   Anesthesia Other Findings   Reproductive/Obstetrics                           Anesthesia Physical Anesthesia Plan  ASA: III  Anesthesia Plan: General   Post-op Pain Management:    Induction:   Airway Management Planned: LMA  Additional Equipment:   Intra-op Plan:   Post-operative Plan: Extubation in OR  Informed Consent: I have reviewed the patients History and Physical, chart, labs and discussed the procedure including the risks, benefits and alternatives for the proposed anesthesia with the patient or authorized representative who has indicated his/her understanding and acceptance.   Dental advisory given  Plan Discussed with: CRNA  Anesthesia Plan Comments:         Anesthesia Quick Evaluation

## 2012-10-23 NOTE — Op Note (Signed)
NAME:  Robert [name], Robert [name] NO.:  [id number]  MEDICAL RECORD NO.:  [id number]  LOCATION:  [facility]                         FACILITY:  [facility]  PHYSICIAN:  Jene Every, M.D.    DATE OF BIRTH:  03-09-1951  DATE OF PROCEDURE:  10/22/2012 DATE OF DISCHARGE:  10/22/2012                              OPERATIVE REPORT   PREOPERATIVE DIAGNOSES:  Medial meniscus tear of the left knee, degenerative joint disease.  POSTOPERATIVE DIAGNOSES:  Medial meniscus tear of the left knee, degenerative joint disease with grade 4 lesion in the medial femoral condyle and medial tibial plateau, lateral meniscus tear, grade 3 chondromalacia of the patella.  PROCEDURE PERFORMED: 1. Left knee arthroscopy. 2. Partial, medial, and lateral meniscectomy. 3. Light chondroplasty of the femoral condyle, patella.  HISTORY:  This is a 62 year old male with history of a DVT, PE and has had a persistent disabling knee pain since an injury back in December this year.  He has exhausted extensive conservative treatment including corticosteroid injection, therapy, activity modification, Visco supplementation, MRI indicating meniscus tear.  Due to persistent pain, disabling in activity, was indicated for arthroscopic meniscectomy and debridement with the risks including bleeding, infection, DVT, PE, anesthetic complications, death, need for revision, total knee, no changes in symptoms, etc.  He was on long-term Coumadin for his DVT and his PE.  He was removed from that and we will resume that postoperatively.  TECHNIQUE:  With the patient in supine position, after induction of adequate general anesthesia, 3 g of Kefzol and __________, the left lower extremity was prepped and draped in usual sterile fashion.  A lateral parapatellar portal was fashioned with a #11 blade.  Ingress cannula atraumatically placed.  Irrigant was utilized to insufflate the joint.  Under direct visualization, a medial parapatellar  port was fashioned with a #11 blade after localization with an 18-gauge needle sparing the medial meniscus.  Initial insertion produced copious portions of clear synovial fluid with cartilaginous debris.  Noted was extensive grade 3 and moderate grade 4 changes not only with tibial plateau, but diffusely through the femoral condyle.  Tear of the posterior third mid body of the medial meniscus was unstable.  I introduced a basket and resected it to a stable base, further contoured with a ArthroWand.  Light chondroplasty of the femoral condyle and tibial plateau.  We felt grade 4 changes were of a geographic involvement not practically amenable to abrasion arthroplasty. Following this, we examined the ACL was unremarkable.  Lateral compartment revealed a tear in the anterior horn of the lateral meniscus which was resected with a basket and contoured with a shaver to a stable base.  The femoral condyle and tibial plateau was unremarkable from a cartilaginous standpoint.  Suprapatellar pouch revealed some grade 3 changes of the patella.  There was normal patellofemoral tracking, some grade 3 changes of the sulcus. Gutters were unremarkable.  Light chondroplasty performed of the patella.  We visited all compartments.  No further pathology amenable to arthroscopic intervention.  Therefore, removed all instrumentation. Portals were closed with 4-0 nylon simple sutures.  A 0.25% Marcaine with epinephrine was infiltrated into the joint.  Wound was dressed sterilely.  Awoken without difficulty and  transported to the recovery room in satisfactory condition.  The patient tolerated the procedure well.  No complications.  Assistant was Lanna Poche, [place].  It was necessary to hold in position the extremity in this large individual to obtain a valgus stress to enter the lateral compartment.     Jene Every, M.D.     Robert [name]  D:  10/22/2012  T:  10/22/2012  Job:  [id number]

## 2012-10-25 ENCOUNTER — Encounter (HOSPITAL_COMMUNITY): Payer: Self-pay | Admitting: Specialist

## 2012-10-27 ENCOUNTER — Ambulatory Visit (INDEPENDENT_AMBULATORY_CARE_PROVIDER_SITE_OTHER): Payer: 59 | Admitting: General Practice

## 2012-10-27 DIAGNOSIS — I80299 Phlebitis and thrombophlebitis of other deep vessels of unspecified lower extremity: Secondary | ICD-10-CM

## 2012-10-27 DIAGNOSIS — Z7901 Long term (current) use of anticoagulants: Secondary | ICD-10-CM

## 2012-10-27 DIAGNOSIS — I2699 Other pulmonary embolism without acute cor pulmonale: Secondary | ICD-10-CM

## 2012-10-27 LAB — POCT INR: INR: 1.4

## 2012-10-29 ENCOUNTER — Ambulatory Visit (INDEPENDENT_AMBULATORY_CARE_PROVIDER_SITE_OTHER): Payer: 59

## 2012-10-29 DIAGNOSIS — Z7901 Long term (current) use of anticoagulants: Secondary | ICD-10-CM

## 2012-10-29 DIAGNOSIS — I2699 Other pulmonary embolism without acute cor pulmonale: Secondary | ICD-10-CM

## 2012-10-29 DIAGNOSIS — I80299 Phlebitis and thrombophlebitis of other deep vessels of unspecified lower extremity: Secondary | ICD-10-CM

## 2012-10-29 LAB — POCT INR: INR: 2.1

## 2012-11-26 ENCOUNTER — Ambulatory Visit (INDEPENDENT_AMBULATORY_CARE_PROVIDER_SITE_OTHER): Payer: 59 | Admitting: Family Medicine

## 2012-11-26 DIAGNOSIS — Z7901 Long term (current) use of anticoagulants: Secondary | ICD-10-CM

## 2012-11-26 DIAGNOSIS — I80299 Phlebitis and thrombophlebitis of other deep vessels of unspecified lower extremity: Secondary | ICD-10-CM

## 2012-11-26 DIAGNOSIS — I2699 Other pulmonary embolism without acute cor pulmonale: Secondary | ICD-10-CM

## 2012-11-26 LAB — POCT INR: INR: 2.8

## 2012-12-14 ENCOUNTER — Other Ambulatory Visit (INDEPENDENT_AMBULATORY_CARE_PROVIDER_SITE_OTHER): Payer: 59

## 2012-12-14 DIAGNOSIS — M19049 Primary osteoarthritis, unspecified hand: Secondary | ICD-10-CM

## 2012-12-14 DIAGNOSIS — Z7901 Long term (current) use of anticoagulants: Secondary | ICD-10-CM

## 2012-12-14 DIAGNOSIS — M545 Low back pain, unspecified: Secondary | ICD-10-CM

## 2012-12-14 DIAGNOSIS — E669 Obesity, unspecified: Secondary | ICD-10-CM

## 2012-12-14 DIAGNOSIS — M1810 Unilateral primary osteoarthritis of first carpometacarpal joint, unspecified hand: Secondary | ICD-10-CM

## 2012-12-14 DIAGNOSIS — E538 Deficiency of other specified B group vitamins: Secondary | ICD-10-CM

## 2012-12-14 DIAGNOSIS — L219 Seborrheic dermatitis, unspecified: Secondary | ICD-10-CM

## 2012-12-14 DIAGNOSIS — I2699 Other pulmonary embolism without acute cor pulmonale: Secondary | ICD-10-CM

## 2012-12-14 DIAGNOSIS — Z0000 Encounter for general adult medical examination without abnormal findings: Secondary | ICD-10-CM

## 2012-12-14 LAB — LIPID PANEL
Cholesterol: 251 mg/dL — ABNORMAL HIGH (ref 0–200)
HDL: 32 mg/dL — ABNORMAL LOW (ref >39.00)
Total CHOL/HDL Ratio: 8
Triglycerides: 160 mg/dL — ABNORMAL HIGH (ref 0.0–149.0)
VLDL: 32 mg/dL (ref 0.0–40.0)

## 2012-12-14 LAB — CBC WITH DIFFERENTIAL/PLATELET
Basophils Absolute: 0 10*3/uL (ref 0.0–0.1)
Basophils Relative: 0.5 % (ref 0.0–3.0)
Eosinophils Absolute: 1.3 10*3/uL — ABNORMAL HIGH (ref 0.0–0.7)
Eosinophils Relative: 15.3 % — ABNORMAL HIGH (ref 0.0–5.0)
HCT: 43.2 % (ref 39.0–52.0)
Hemoglobin: 15.5 g/dL (ref 13.0–17.0)
Lymphocytes Relative: 24.9 % (ref 12.0–46.0)
Lymphs Abs: 2 10*3/uL (ref 0.7–4.0)
MCHC: 35.8 g/dL (ref 30.0–36.0)
MCV: 93 fl (ref 78.0–100.0)
Monocytes Absolute: 0.6 10*3/uL (ref 0.1–1.0)
Monocytes Relative: 7.4 % (ref 3.0–12.0)
Neutro Abs: 4.3 10*3/uL (ref 1.4–7.7)
Neutrophils Relative %: 51.9 % (ref 43.0–77.0)
Platelets: 266 10*3/uL (ref 150.0–400.0)
RBC: 4.65 Mil/uL (ref 4.22–5.81)
RDW: 13.3 % (ref 11.5–14.6)
WBC: 8.2 10*3/uL (ref 4.5–10.5)

## 2012-12-14 LAB — HEPATIC FUNCTION PANEL
ALT: 27 U/L (ref 0–53)
AST: 41 U/L — ABNORMAL HIGH (ref 0–37)
Albumin: 3.8 g/dL (ref 3.5–5.2)
Alkaline Phosphatase: 61 U/L (ref 39–117)
Bilirubin, Direct: 0.6 mg/dL — ABNORMAL HIGH (ref 0.0–0.3)
Total Bilirubin: 2 mg/dL — ABNORMAL HIGH (ref 0.3–1.2)
Total Protein: 7.3 g/dL (ref 6.0–8.3)

## 2012-12-14 LAB — BASIC METABOLIC PANEL
BUN: 15 mg/dL (ref 6–23)
CO2: 26 mEq/L (ref 19–32)
Calcium: 8.8 mg/dL (ref 8.4–10.5)
Chloride: 101 mEq/L (ref 96–112)
Creatinine, Ser: 1 mg/dL (ref 0.4–1.5)
GFR: 81.62 mL/min (ref >60.00)
Glucose, Bld: 110 mg/dL — ABNORMAL HIGH (ref 70–99)
Potassium: 5.2 mEq/L — ABNORMAL HIGH (ref 3.5–5.1)
Sodium: 138 mEq/L (ref 135–145)

## 2012-12-14 LAB — URINALYSIS
Bilirubin Urine: NEGATIVE
Ketones, ur: NEGATIVE
Leukocytes, UA: NEGATIVE
Nitrite: NEGATIVE
Specific Gravity, Urine: 1.015 (ref 1.000–1.030)
Total Protein, Urine: NEGATIVE
Urine Glucose: NEGATIVE
Urobilinogen, UA: 0.2 (ref 0.0–1.0)
pH: 5.5 (ref 5.0–8.0)

## 2012-12-14 LAB — LDL CHOLESTEROL, DIRECT: Direct LDL: 197.8 mg/dL

## 2012-12-14 LAB — PSA: PSA: 0.13 ng/mL (ref 0.10–4.00)

## 2012-12-14 LAB — TSH: TSH: 2.69 u[IU]/mL (ref 0.35–5.50)

## 2012-12-17 ENCOUNTER — Ambulatory Visit: Payer: 59 | Admitting: Internal Medicine

## 2012-12-21 ENCOUNTER — Encounter: Payer: Self-pay | Admitting: Internal Medicine

## 2012-12-21 ENCOUNTER — Ambulatory Visit (INDEPENDENT_AMBULATORY_CARE_PROVIDER_SITE_OTHER): Payer: 59 | Admitting: Internal Medicine

## 2012-12-21 ENCOUNTER — Ambulatory Visit (INDEPENDENT_AMBULATORY_CARE_PROVIDER_SITE_OTHER): Payer: 59 | Admitting: General Practice

## 2012-12-21 VITALS — BP 140/88 | HR 80 | Temp 97.7°F | Resp 16 | Wt 303.0 lb

## 2012-12-21 DIAGNOSIS — I2699 Other pulmonary embolism without acute cor pulmonale: Secondary | ICD-10-CM

## 2012-12-21 DIAGNOSIS — J45909 Unspecified asthma, uncomplicated: Secondary | ICD-10-CM

## 2012-12-21 DIAGNOSIS — I80299 Phlebitis and thrombophlebitis of other deep vessels of unspecified lower extremity: Secondary | ICD-10-CM

## 2012-12-21 DIAGNOSIS — E538 Deficiency of other specified B group vitamins: Secondary | ICD-10-CM

## 2012-12-21 DIAGNOSIS — E669 Obesity, unspecified: Secondary | ICD-10-CM

## 2012-12-21 DIAGNOSIS — M545 Low back pain, unspecified: Secondary | ICD-10-CM

## 2012-12-21 DIAGNOSIS — E785 Hyperlipidemia, unspecified: Secondary | ICD-10-CM

## 2012-12-21 DIAGNOSIS — R7989 Other specified abnormal findings of blood chemistry: Secondary | ICD-10-CM

## 2012-12-21 DIAGNOSIS — Z7901 Long term (current) use of anticoagulants: Secondary | ICD-10-CM

## 2012-12-21 LAB — POCT INR: INR: 2.6

## 2012-12-21 MED ORDER — OXYCODONE HCL 5 MG PO TABS: 5.0000 mg | ORAL_TABLET | Freq: Three times a day (TID) | ORAL | Status: DC | PRN

## 2012-12-21 NOTE — Assessment & Plan Note (Signed)
Doing well 

## 2012-12-21 NOTE — Assessment & Plan Note (Signed)
Wt Readings from Last 3 Encounters:  12/21/12 303 lb (137.44 kg)  10/19/12 303 lb 6 oz (137.61 kg)  09/16/12 302 lb (136.986 kg)

## 2012-12-21 NOTE — Assessment & Plan Note (Signed)
Continue with current prescription therapy as reflected on the Med list.  

## 2012-12-21 NOTE — Assessment & Plan Note (Signed)
Remote DVT/PE Lifetime anticoagulation

## 2012-12-21 NOTE — Assessment & Plan Note (Signed)
Chronic. Declined statins 

## 2012-12-21 NOTE — Progress Notes (Signed)
Patient ID: Robert [name], male   DOB: 1951-01-24, 62 y.o.   MRN: [id number]   Subjective:    HPI    The patient is here to follow up on chronic severe to moderate LBP/OA symptoms controlled partially with medicines (oxycodone), hand pain, B12 def, h/o PE/DVT  C/o LBP and LLE pain 5-6/10; it is still 10/10 in am   C/o SD rash at times  BP Readings from Last 3 Encounters:  12/21/12 140/88  10/22/12 137/71  10/22/12 137/71   Wt Readings from Last 3 Encounters:  12/21/12 303 lb (137.44 kg)  10/19/12 303 lb 6 oz (137.61 kg)  09/16/12 302 lb (136.986 kg)      Review of Systems  Constitutional: Positive for fatigue.  HENT: Positive for congestion, rhinorrhea and postnasal drip. Negative for sinus pressure.   Respiratory: Positive for cough. Negative for chest tightness, shortness of breath and wheezing.   Cardiovascular: Positive for leg swelling.  Gastrointestinal: Negative for abdominal pain.  Genitourinary: Negative for frequency.  Musculoskeletal: Positive for back pain and arthralgias. Negative for myalgias.  Neurological: Negative for syncope.  Psychiatric/Behavioral: Positive for sleep disturbance. Negative for suicidal ideas and dysphoric mood. The patient is not nervous/anxious.        Objective:   Physical Exam  Constitutional: He is oriented to person, place, and time. He appears well-developed.  obese  HENT:  Mouth/Throat: Oropharynx is clear and moist.  Eyes: Conjunctivae are normal. Pupils are equal, round, and reactive to light.  Neck: Normal range of motion. No JVD present. No thyromegaly present.  Cardiovascular: Normal rate, regular rhythm, normal heart sounds and intact distal pulses.  Exam reveals no gallop and no friction rub.   No murmur heard. Pulmonary/Chest: Effort normal and breath sounds normal. No respiratory distress. He has no wheezes. He has no rales. He exhibits no tenderness.  Abdominal: Soft. Bowel sounds are normal. He exhibits no  distension and no mass. There is no tenderness. There is no rebound and no guarding.  Musculoskeletal: Normal range of motion. He exhibits edema and tenderness (LS spine).  Lymphadenopathy:    He has no cervical adenopathy.  Neurological: He is alert and oriented to person, place, and time. He has normal reflexes. No cranial nerve deficit. He exhibits normal muscle tone. Coordination normal.  Skin: Skin is warm and dry. No rash noted.  Psychiatric: He has a normal mood and affect. His behavior is normal. Judgment and thought content normal.    Lab Results  Component Value Date   WBC 8.2 12/14/2012   HGB 15.5 12/14/2012   HCT 43.2 12/14/2012   PLT 266.0 12/14/2012   GLUCOSE 110* 12/14/2012   CHOL 251* 12/14/2012   TRIG 160.0* 12/14/2012   HDL 32.00* 12/14/2012   LDLDIRECT 197.8 12/14/2012   ALT 27 12/14/2012   AST 41* 12/14/2012   NA 138 12/14/2012   K 5.2* 12/14/2012   CL 101 12/14/2012   CREATININE 1.0 12/14/2012   BUN 15 12/14/2012   CO2 26 12/14/2012   TSH 2.69 12/14/2012   PSA 0.13 12/14/2012   INR 2.8 11/26/2012   HGBA1C 5.3 04/19/2007         Assessment & Plan:

## 2012-12-21 NOTE — Assessment & Plan Note (Signed)
Mild - fatty liver Loose wt

## 2013-01-04 ENCOUNTER — Encounter: Payer: Self-pay | Admitting: Internal Medicine

## 2013-01-04 ENCOUNTER — Ambulatory Visit (INDEPENDENT_AMBULATORY_CARE_PROVIDER_SITE_OTHER): Payer: 59 | Admitting: Internal Medicine

## 2013-01-04 VITALS — BP 122/72 | HR 79 | Temp 97.1°F | Ht 71.0 in | Wt 298.8 lb

## 2013-01-04 DIAGNOSIS — R609 Edema, unspecified: Secondary | ICD-10-CM

## 2013-01-04 DIAGNOSIS — J019 Acute sinusitis, unspecified: Secondary | ICD-10-CM | POA: Insufficient documentation

## 2013-01-04 DIAGNOSIS — L309 Dermatitis, unspecified: Secondary | ICD-10-CM | POA: Insufficient documentation

## 2013-01-04 DIAGNOSIS — L259 Unspecified contact dermatitis, unspecified cause: Secondary | ICD-10-CM

## 2013-01-04 MED ORDER — LEVOFLOXACIN 250 MG PO TABS: 250.0000 mg | ORAL_TABLET | Freq: Every day | ORAL | Status: DC

## 2013-01-04 MED ORDER — CLOBETASOL PROPIONATE 0.05 % EX CREA: TOPICAL_CREAM | Freq: Two times a day (BID) | CUTANEOUS | Status: DC

## 2013-01-04 NOTE — Assessment & Plan Note (Signed)
stable overall by history and exam, recent data reviewed with pt, and pt to continue medical treatment as before,  to f/u any worsening symptoms or concerns Lab Results  Component Value Date   WBC 8.2 12/14/2012   HGB 15.5 12/14/2012   HCT 43.2 12/14/2012   PLT 266.0 12/14/2012   GLUCOSE 110* 12/14/2012   CHOL 251* 12/14/2012   TRIG 160.0* 12/14/2012   HDL 32.00* 12/14/2012   LDLDIRECT 197.8 12/14/2012   ALT 27 12/14/2012   AST 41* 12/14/2012   NA 138 12/14/2012   K 5.2* 12/14/2012   CL 101 12/14/2012   CREATININE 1.0 12/14/2012   BUN 15 12/14/2012   CO2 26 12/14/2012   TSH 2.69 12/14/2012   PSA 0.13 12/14/2012   INR 2.6 12/21/2012   HGBA1C 5.3 04/19/2007

## 2013-01-04 NOTE — Assessment & Plan Note (Signed)
Mild to mod, for antibx course,  to f/u any worsening symptoms or concerns 

## 2013-01-04 NOTE — Progress Notes (Signed)
Subjective:    Patient ID: Robert [name], male    DOB: [birth year], 62 y.o.   MRN: [id number]  HPI   Here with 2-3 days acute onset fever, facial pain, pressure, headache, general weakness and malaise, and greenish d/c, with mild ST and cough, but pt denies chest pain, wheezing, increased sob or doe, orthopnea, PND, increased LE swelling, palpitations, dizziness or syncope.  Also with rash to post right calf, nontender, itchy but enlarging with scratching over several days, may have started after working with the right leg elevated on a ladder stair in an effort to reduce his post phlebitic chronic edema.   Pt denies polydipsia, polyuria.  Pt denies new neurological symptoms such as new headache, or facial or extremity weakness or numbness Past Medical History  Diagnosis Date  . Warfarin anticoagulation   . LBP (low back pain)   . History of pulmonary embolism     2003  . Asthma   . Edema     Right Leg w/ h/o DVT postphlebitic  . Hyperlipidemia   . Osteoarthritis   . Obesity   . Complication of anesthesia     SLOW TO WAKE UP / DIFFICULTY RESPONDING 2003  . Headache(784.0)     MIGRAINES  . Knee pain     left  . Peripheral vascular disease     "poor circulation in  RT leg"  . Cyst of left kidney   . Anxiety    Past Surgical History  Procedure Laterality Date  . Lumbar fusion      Dr Jillyn Hidden  . Appendectomy      Dr Jamey Ripa  . Cholecystectomy    . Orchiectomy  1994    R post injury  . Tonsillectomy    . Knee arthroscopy  1990    RT KNEE  . Knee arthroscopy  2003  . Knee arthroscopy  1988    L KNEE  . Finger surgery      RT THUMB RECONSTRUCTION  . Knee arthroscopy with meniscal repair Left 10/22/2012    Procedure: LEFT KNEE ARTHROSCOPY PARTIAL MEDIAL AND LATERAL MENISCECTOMY AND DEBRIDEMENT, CHONDROPLASTY ;  Surgeon: Javier Docker, MD;  Location: [facility];  Service: Orthopedics;  Laterality: Left;    reports that he has never smoked. He has never used smokeless tobacco. He  reports that he does not drink alcohol or use illicit drugs. family history includes Heart disease in his mother; Hyperlipidemia in his other; Hypertension in his other; and Parkinsonism in his other. Allergies  Allergen Reactions  . Exalgo (Hydromorphone Hcl) Nausea Only    Sick and nauseated  . Fentanyl Other (See Comments)    REACTION: too sleepy  . Hydrocodone-Acetaminophen Nausea Only    REACTION: nausea  . Penicillins Hives  . Sulfonamide Derivatives Hives and Swelling  . Tramadol Hcl     REACTION: did not help  . Tylenol (Acetaminophen)     Liver issues   Current Outpatient Prescriptions on File Prior to Visit  Medication Sig Dispense Refill  . albuterol (PROVENTIL HFA;VENTOLIN HFA) 108 (90 BASE) MCG/ACT inhaler Inhale 2 puffs into the lungs every 6 (six) hours as needed for wheezing.      . cetirizine (ZYRTEC) 10 MG tablet Take 10 mg by mouth daily.       . clotrimazole-betamethasone (LOTRISONE) cream Apply 1 application topically 2 (two) times daily as needed. Apply to red spots on face      . diphenoxylate-atropine (LOMOTIL) 2.5-0.025 MG per tablet Take 1-2 tablets  by mouth 4 (four) times daily as needed for diarrhea or loose stools.       . enoxaparin (LOVENOX) 150 MG/ML injection Inject 0.91 mLs (135 mg total) into the skin every 12 (twelve) hours.  20 Syringe  0  . fish oil-omega-3 fatty acids 1000 MG capsule Take 1 g by mouth daily.        . furosemide (LASIX) 20 MG tablet Take 20 mg by mouth daily.      . Glucosamine 500 MG CAPS Take 1 capsule by mouth daily.        . nortriptyline (PAMELOR) 10 MG capsule Take 10 mg by mouth at bedtime.      [name] oxyCODONE (OXY IR/ROXICODONE) 5 MG immediate release tablet Take 1 tablet (5 mg total) by mouth every 8 (eight) hours as needed for pain. Please fill on or after 03/03/13  90 tablet  0  . vitamin B-12 (CYANOCOBALAMIN) 1000 MCG tablet Take 1,000 mcg by mouth daily.        [name] warfarin (COUMADIN) 6 MG tablet Take 3-6 mg by mouth daily.  Take 6mg  on all days except take 3mg  on Friday       No current facility-administered medications on file prior to visit.   Review of Systems  Constitutional: Negative for unexpected weight change, or unusual diaphoresis  HENT: Negative for tinnitus.   Eyes: Negative for photophobia and visual disturbance.  Respiratory: Negative for choking and stridor.   Gastrointestinal: Negative for vomiting and blood in stool.  Genitourinary: Negative for hematuria and decreased urine volume.  Musculoskeletal: Negative for acute joint swelling Skin: Negative for color change and wound. other than above Neurological: Negative for tremors and numbness other than noted  Psychiatric/Behavioral: Negative for decreased concentration or  hyperactivity.       Objective:   Physical Exam BP 122/72  Pulse 79  Temp(Src) 97.1 F (36.2 C) (Oral)  Ht 5\' 11"  (1.803 m)  Wt 298 lb 12 oz (135.512 kg)  BMI 41.69 kg/m2  SpO2 97% VS noted, mild ill Constitutional: Pt appears well-developed and well-nourished.  HENT: Head: NCAT.  Right Ear: External ear normal.  Left Ear: External ear normal.  Bilat tm's with mild erythema.  Max sinus areas mild tender.  Pharynx with mild erythema, no exudate Eyes: Conjunctivae and EOM are normal. Pupils are equal, round, and reactive to light.  Neck: Normal range of motion. Neck supple.  Cardiovascular: Normal rate and regular rhythm.   Pulmonary/Chest: Effort normal and breath sounds normal.  Neurological: Pt is alert. Not confused  Skin: post right calf with slight raised probable angioiedematous plaquelike area nontender erythema approx 6 cm area RLE o/w mild post phlebitic trace to 1+ Psychiatric: Pt behavior is normal. Thought content normal.     Assessment & Plan:

## 2013-01-04 NOTE — Assessment & Plan Note (Signed)
Mild to mod, for topical steroid course,  to f/u any worsening symptoms or concerns

## 2013-01-04 NOTE — Patient Instructions (Signed)
Please take all new medication as prescribed It should be ok to take the levaquin with the coumadin Please continue all other medications as before, and refills have been done if requested. Please have the pharmacy call with any other refills you may need.  Please remember to sign up for My Chart if you have not done so, as this will be important to you in the future with finding out test results, communicating by private email, and scheduling acute appointments online when needed.

## 2013-02-01 ENCOUNTER — Ambulatory Visit (INDEPENDENT_AMBULATORY_CARE_PROVIDER_SITE_OTHER): Payer: 59 | Admitting: General Practice

## 2013-02-01 DIAGNOSIS — Z7901 Long term (current) use of anticoagulants: Secondary | ICD-10-CM

## 2013-02-01 DIAGNOSIS — I80299 Phlebitis and thrombophlebitis of other deep vessels of unspecified lower extremity: Secondary | ICD-10-CM

## 2013-02-01 DIAGNOSIS — I2699 Other pulmonary embolism without acute cor pulmonale: Secondary | ICD-10-CM

## 2013-02-01 LAB — POCT INR: INR: 2.8

## 2013-02-20 ENCOUNTER — Other Ambulatory Visit: Payer: Self-pay | Admitting: Internal Medicine

## 2013-03-01 ENCOUNTER — Telehealth: Payer: Self-pay | Admitting: General Practice

## 2013-03-01 ENCOUNTER — Other Ambulatory Visit: Payer: Self-pay | Admitting: Orthopedic Surgery

## 2013-03-01 ENCOUNTER — Other Ambulatory Visit: Payer: Self-pay | Admitting: General Practice

## 2013-03-01 MED ORDER — ENOXAPARIN SODIUM 150 MG/ML ~~LOC~~ SOLN: 1.0000 mg/kg | Freq: Two times a day (BID) | SUBCUTANEOUS | Status: DC

## 2013-03-01 NOTE — Telephone Encounter (Signed)
Instructions for coumadin and Lovenox pre surgery.   9/24 - Take last dose of coumadin 9/25 - Take nothing 9/26 - Take Lovenox in AM and PM 9/27 - Take Lovenox in AM and PM 9/28 - Take Lovenox in AM only 9/29 - Procedure - Take nothing  Check INR after DC from hospital.  Patient verbalized understanding of instructions.

## 2013-03-01 NOTE — Progress Notes (Signed)
Preoperative surgical orders have been place into the Epic hospital system for Robert [name] on 03/01/2013, 5:52 PM  by Patrica Duel for surgery on 03/21/2013.  Preop Total Knee orders including Experal, PO Tylenol, and IV Decadron as long as there are no contraindications to the above medications. Avel Peace, PA-C

## 2013-03-09 ENCOUNTER — Other Ambulatory Visit (INDEPENDENT_AMBULATORY_CARE_PROVIDER_SITE_OTHER): Payer: 59

## 2013-03-09 DIAGNOSIS — J45909 Unspecified asthma, uncomplicated: Secondary | ICD-10-CM

## 2013-03-09 DIAGNOSIS — M545 Low back pain, unspecified: Secondary | ICD-10-CM

## 2013-03-09 DIAGNOSIS — I80299 Phlebitis and thrombophlebitis of other deep vessels of unspecified lower extremity: Secondary | ICD-10-CM

## 2013-03-09 DIAGNOSIS — E785 Hyperlipidemia, unspecified: Secondary | ICD-10-CM

## 2013-03-09 DIAGNOSIS — R7989 Other specified abnormal findings of blood chemistry: Secondary | ICD-10-CM

## 2013-03-09 DIAGNOSIS — E538 Deficiency of other specified B group vitamins: Secondary | ICD-10-CM

## 2013-03-09 DIAGNOSIS — E669 Obesity, unspecified: Secondary | ICD-10-CM

## 2013-03-09 LAB — BASIC METABOLIC PANEL
BUN: 11 mg/dL (ref 6–23)
CO2: 29 mEq/L (ref 19–32)
Calcium: 9 mg/dL (ref 8.4–10.5)
Chloride: 102 mEq/L (ref 96–112)
Creatinine, Ser: 0.9 mg/dL (ref 0.4–1.5)
GFR: 92.22 mL/min (ref >60.00)
Glucose, Bld: 101 mg/dL — ABNORMAL HIGH (ref 70–99)
Potassium: 4.2 mEq/L (ref 3.5–5.1)
Sodium: 136 mEq/L (ref 135–145)

## 2013-03-09 LAB — HEPATIC FUNCTION PANEL
ALT: 24 U/L (ref 0–53)
AST: 24 U/L (ref 0–37)
Albumin: 3.7 g/dL (ref 3.5–5.2)
Alkaline Phosphatase: 61 U/L (ref 39–117)
Bilirubin, Direct: 0.1 mg/dL (ref 0.0–0.3)
Total Bilirubin: 0.8 mg/dL (ref 0.3–1.2)
Total Protein: 7.7 g/dL (ref 6.0–8.3)

## 2013-03-09 LAB — LIPID PANEL
Cholesterol: 218 mg/dL — ABNORMAL HIGH (ref 0–200)
HDL: 26.2 mg/dL — ABNORMAL LOW (ref >39.00)
Total CHOL/HDL Ratio: 8
Triglycerides: 145 mg/dL (ref 0.0–149.0)
VLDL: 29 mg/dL (ref 0.0–40.0)

## 2013-03-09 LAB — LDL CHOLESTEROL, DIRECT: Direct LDL: 168.1 mg/dL

## 2013-03-10 ENCOUNTER — Encounter (HOSPITAL_COMMUNITY): Payer: Self-pay | Admitting: Pharmacy Technician

## 2013-03-11 ENCOUNTER — Other Ambulatory Visit (HOSPITAL_COMMUNITY): Payer: Self-pay | Admitting: Orthopedic Surgery

## 2013-03-11 NOTE — Progress Notes (Signed)
ekg 10-19-12 epic Chest 2 view 10-19-12 epic 03-27-11 echo/stress epic

## 2013-03-11 NOTE — Patient Instructions (Addendum)
20 Robert [name]  03/11/2013   Your procedure is scheduled on: 03-21-2013  Report to [facility] at 940 AM.  Call this number if you have problems the morning of surgery [phone]   Remember:   Do not eat food or drink liquids :After Midnight.     Take these medicines the morning of surgery with A SIP OF WATER: zyrtec                         You may not have any metal on your body including hair pins and piercings  Do not wear jewelry, make-up.  Do not wear lotions, powders, or perfumes. You may wear deodorant.   Men may shave face and neck.  Do not bring valuables to the hospital. [facility] IS NOT RESPONSIBLE FOR VALUEABLES.  Contacts, dentures or bridgework may not be worn into surgery.  Leave suitcase in the car. After surgery it may be brought to your room.  For patients admitted to the hospital, checkout time is 11:00 AM the day of discharge.   Please read over the following fact sheets that you were given: mrsa information, blood fact sheet, incentive spirometer sheet  Call  Birdie Sons RN pre op nurse if needed [phone]    FAILURE TO FOLLOW THESE INSTRUCTIONS MAY RESULT IN THE CANCELLATION OF YOUR SURGERY.  PATIENT SIGNATURE___________________________________________  NURSE SIGNATURE_____________________________________________

## 2013-03-14 ENCOUNTER — Ambulatory Visit (INDEPENDENT_AMBULATORY_CARE_PROVIDER_SITE_OTHER): Payer: 59 | Admitting: Internal Medicine

## 2013-03-14 ENCOUNTER — Encounter (HOSPITAL_COMMUNITY): Payer: Self-pay

## 2013-03-14 ENCOUNTER — Encounter: Payer: Self-pay | Admitting: Internal Medicine

## 2013-03-14 ENCOUNTER — Encounter (HOSPITAL_COMMUNITY)
Admission: RE | Admit: 2013-03-14 | Discharge: 2013-03-14 | Disposition: A | Discharge status: Home / Self Care | Payer: 59 | Source: Ambulatory Visit | Attending: Orthopedic Surgery | Admitting: Orthopedic Surgery

## 2013-03-14 VITALS — BP 130/84 | HR 76 | Temp 97.0°F | Resp 16 | Wt 290.0 lb

## 2013-03-14 DIAGNOSIS — E669 Obesity, unspecified: Secondary | ICD-10-CM

## 2013-03-14 DIAGNOSIS — M1712 Unilateral primary osteoarthritis, left knee: Secondary | ICD-10-CM | POA: Insufficient documentation

## 2013-03-14 DIAGNOSIS — Z01818 Encounter for other preprocedural examination: Secondary | ICD-10-CM | POA: Insufficient documentation

## 2013-03-14 DIAGNOSIS — Z23 Encounter for immunization: Secondary | ICD-10-CM

## 2013-03-14 DIAGNOSIS — J45909 Unspecified asthma, uncomplicated: Secondary | ICD-10-CM

## 2013-03-14 DIAGNOSIS — E538 Deficiency of other specified B group vitamins: Secondary | ICD-10-CM

## 2013-03-14 DIAGNOSIS — I2699 Other pulmonary embolism without acute cor pulmonale: Secondary | ICD-10-CM

## 2013-03-14 DIAGNOSIS — Z01812 Encounter for preprocedural laboratory examination: Secondary | ICD-10-CM | POA: Insufficient documentation

## 2013-03-14 DIAGNOSIS — IMO0002 Reserved for concepts with insufficient information to code with codable children: Secondary | ICD-10-CM

## 2013-03-14 DIAGNOSIS — E785 Hyperlipidemia, unspecified: Secondary | ICD-10-CM

## 2013-03-14 DIAGNOSIS — M1710 Unilateral primary osteoarthritis, unspecified knee: Secondary | ICD-10-CM

## 2013-03-14 HISTORY — DX: Personal history of other specified conditions: Z87.898

## 2013-03-14 HISTORY — DX: Acute embolism and thrombosis of unspecified deep veins of unspecified lower extremity: I82.409

## 2013-03-14 HISTORY — DX: Spondylolisthesis, site unspecified: M43.10

## 2013-03-14 HISTORY — DX: Nausea with vomiting, unspecified: R11.2

## 2013-03-14 HISTORY — DX: Other specified postprocedural states: Z98.890

## 2013-03-14 HISTORY — DX: Wheezing: R06.2

## 2013-03-14 HISTORY — DX: Solitary pulmonary nodule: R91.1

## 2013-03-14 LAB — COMPREHENSIVE METABOLIC PANEL
ALT: 25 U/L (ref 0–53)
AST: 24 U/L (ref 0–37)
Albumin: 3.7 g/dL (ref 3.5–5.2)
Alkaline Phosphatase: 69 U/L (ref 39–117)
BUN: 15 mg/dL (ref 6–23)
CO2: 29 mEq/L (ref 19–32)
Calcium: 9.4 mg/dL (ref 8.4–10.5)
Chloride: 97 mEq/L (ref 96–112)
Creatinine, Ser: 0.95 mg/dL (ref 0.50–1.35)
GFR calc Af Amer: >90 mL/min (ref >90)
GFR calc non Af Amer: 88 mL/min — ABNORMAL LOW (ref >90)
Glucose, Bld: 98 mg/dL (ref 70–99)
Potassium: 4.4 mEq/L (ref 3.5–5.1)
Sodium: 135 mEq/L (ref 135–145)
Total Bilirubin: 0.4 mg/dL (ref 0.3–1.2)
Total Protein: 7.9 g/dL (ref 6.0–8.3)

## 2013-03-14 LAB — CBC
HCT: 44.2 % (ref 39.0–52.0)
Hemoglobin: 15.2 g/dL (ref 13.0–17.0)
MCH: 31.5 pg (ref 26.0–34.0)
MCHC: 34.4 g/dL (ref 30.0–36.0)
MCV: 91.7 fL (ref 78.0–100.0)
Platelets: 291 10*3/uL (ref 150–400)
RBC: 4.82 MIL/uL (ref 4.22–5.81)
RDW: 12.9 % (ref 11.5–15.5)
WBC: 8.6 10*3/uL (ref 4.0–10.5)

## 2013-03-14 LAB — URINALYSIS, ROUTINE W REFLEX MICROSCOPIC
Bilirubin Urine: NEGATIVE
Glucose, UA: NEGATIVE mg/dL
Ketones, ur: NEGATIVE mg/dL
Leukocytes, UA: NEGATIVE
Nitrite: NEGATIVE
Protein, ur: NEGATIVE mg/dL
Specific Gravity, Urine: 1.018 (ref 1.005–1.030)
Urobilinogen, UA: 0.2 mg/dL (ref 0.0–1.0)
pH: 5 (ref 5.0–8.0)

## 2013-03-14 LAB — URINE MICROSCOPIC-ADD ON

## 2013-03-14 LAB — PROTIME-INR
INR: 2.43 — ABNORMAL HIGH (ref 0.00–1.49)
Prothrombin Time: 25.6 seconds — ABNORMAL HIGH (ref 11.6–15.2)

## 2013-03-14 LAB — SURGICAL PCR SCREEN
MRSA, PCR: NEGATIVE
Staphylococcus aureus: POSITIVE — AB

## 2013-03-14 LAB — APTT: aPTT: 39 seconds — ABNORMAL HIGH (ref 24–37)

## 2013-03-14 LAB — ABO/RH: ABO/RH(D): O POS

## 2013-03-14 MED ORDER — OXYCODONE HCL 5 MG PO TABS: 5.0000 mg | ORAL_TABLET | Freq: Three times a day (TID) | ORAL | Status: DC | PRN

## 2013-03-14 NOTE — Progress Notes (Signed)
   Subjective:    HPI  Surgery next week  The patient is here to follow up on chronic severe to moderate LBP/OA symptoms controlled partially with medicines (oxycodone), hand pain, B12 def, h/o PE/DVT  C/o LBP and LLE pain 5-6/10; it is still 10/10 in am   C/o SD rash at times  BP Readings from Last 3 Encounters:  03/14/13 130/84  01/04/13 122/72  12/21/12 140/88   Wt Readings from Last 3 Encounters:  03/14/13 290 lb (131.543 kg)  01/04/13 298 lb 12 oz (135.512 kg)  12/21/12 303 lb (137.44 kg)      Review of Systems  Constitutional: Positive for fatigue.  HENT: Positive for congestion, rhinorrhea and postnasal drip. Negative for sinus pressure.   Respiratory: Positive for cough. Negative for chest tightness, shortness of breath and wheezing.   Cardiovascular: Positive for leg swelling.  Gastrointestinal: Negative for abdominal pain.  Genitourinary: Negative for frequency.  Musculoskeletal: Positive for back pain and arthralgias. Negative for myalgias.  Neurological: Negative for syncope.  Psychiatric/Behavioral: Positive for sleep disturbance. Negative for suicidal ideas and dysphoric mood. The patient is not nervous/anxious.        Objective:   Physical Exam  Constitutional: He is oriented to person, place, and time. He appears well-developed.  obese  HENT:  Mouth/Throat: Oropharynx is clear and moist.  Eyes: Conjunctivae are normal. Pupils are equal, round, and reactive to light.  Neck: Normal range of motion. No JVD present. No thyromegaly present.  Cardiovascular: Normal rate, regular rhythm, normal heart sounds and intact distal pulses.  Exam reveals no gallop and no friction rub.   No murmur heard. Pulmonary/Chest: Effort normal and breath sounds normal. No respiratory distress. He has no wheezes. He has no rales. He exhibits no tenderness.  Abdominal: Soft. Bowel sounds are normal. He exhibits no distension and no mass. There is no tenderness. There is no  rebound and no guarding.  Musculoskeletal: Normal range of motion. He exhibits edema and tenderness (LS spine).  Lymphadenopathy:    He has no cervical adenopathy.  Neurological: He is alert and oriented to person, place, and time. He has normal reflexes. No cranial nerve deficit. He exhibits normal muscle tone. Coordination normal.  Skin: Skin is warm and dry. No rash noted.  Psychiatric: He has a normal mood and affect. His behavior is normal. Judgment and thought content normal.    Lab Results  Component Value Date   WBC 8.2 12/14/2012   HGB 15.5 12/14/2012   HCT 43.2 12/14/2012   PLT 266.0 12/14/2012   GLUCOSE 101* 03/09/2013   CHOL 218* 03/09/2013   TRIG 145.0 03/09/2013   HDL 26.20* 03/09/2013   LDLDIRECT 168.1 03/09/2013   ALT 24 03/09/2013   AST 24 03/09/2013   NA 136 03/09/2013   K 4.2 03/09/2013   CL 102 03/09/2013   CREATININE 0.9 03/09/2013   BUN 11 03/09/2013   CO2 29 03/09/2013   TSH 2.69 12/14/2012   PSA 0.13 12/14/2012   INR 2.8 02/01/2013   HGBA1C 5.3 04/19/2007         Assessment & Plan:

## 2013-03-14 NOTE — Assessment & Plan Note (Signed)
Cont w/wt loss 

## 2013-03-14 NOTE — Progress Notes (Signed)
03/14/13 1037  OBSTRUCTIVE SLEEP APNEA  Have you ever been diagnosed with sleep apnea through a sleep study? No ("screams in sleep")  Do you snore loudly (loud enough to be heard through closed doors)?  1  Do you often feel tired, fatigued, or sleepy during the daytime? 0  Has anyone observed you stop breathing during your sleep? 0  Do you have, or are you being treated for high blood pressure? 0  BMI more than 35 kg/m2? 1  Age over 61 years old? 1  Neck circumference greater than 40 cm/18 inches? 0  Gender: 1  Obstructive Sleep Apnea Score 4  Score 4 or greater  Results sent to PCP

## 2013-03-14 NOTE — Assessment & Plan Note (Signed)
Continue with current prescription therapy as reflected on the Med list.  

## 2013-03-14 NOTE — Assessment & Plan Note (Signed)
Better  Wt Readings from Last 3 Encounters:  03/14/13 290 lb (131.543 kg)  01/04/13 298 lb 12 oz (135.512 kg)  12/21/12 303 lb (137.44 kg)

## 2013-03-14 NOTE — Assessment & Plan Note (Signed)
9/14 surgery pending

## 2013-03-14 NOTE — Progress Notes (Signed)
Pt has high anxiety about surgery. Pt's wife would like to stay with pt as long as possible to avoid any problems

## 2013-03-14 NOTE — Patient Instructions (Addendum)
Gluten free trial (no wheat products) for 4-6 weeks. OK to use gluten-free bread and gluten-free pasta.  Milk free trial (no milk, ice cream, cheese and yogurt) for 4-6 weeks. OK to use almond, coconut or rice  milk. "Almond breeze" brand tastes good.  

## 2013-03-14 NOTE — Assessment & Plan Note (Signed)
On coumadin 

## 2013-03-20 MED ORDER — VANCOMYCIN HCL 10 G IV SOLR
1500.0000 mg | INTRAVENOUS | Status: AC
  Administered 2013-03-21: 1500 mg via INTRAVENOUS
  Filled 2013-03-20: qty 1500

## 2013-03-20 NOTE — H&P (Signed)
TOTAL KNEE ADMISSION H&P  Patient is being admitted for left total knee arthroplasty.  Subjective:  Chief Complaint:left knee pain.  HPI: Robert [name], 62 y.o. male, has a history of pain and functional disability in the left knee due to arthritis and has failed non-surgical conservative treatments for greater than 12 weeks to includeNSAID's and/or analgesics, corticosteriod injections, viscosupplementation injections and activity modification.  Onset of symptoms was gradual, starting 2 years ago with gradually worsening course since that time. The patient noted prior procedures on the knee to include  arthroscopy and menisectomy on the left knee(s).  Patient currently rates pain in the left knee(s) at 6 out of 10 with activity. Patient has night pain, worsening of pain with activity and weight bearing, pain that interferes with activities of daily living, pain with passive range of motion, crepitus and joint swelling.  Patient has evidence of periarticular osteophytes and joint space narrowing by imaging studies.  There is no active infection.  Patient Active Problem List   Diagnosis Date Noted  . Osteoarthritis of left knee 03/14/2013  . Acute sinus infection 01/04/2013  . Dermatitis 01/04/2013  . Elevated LFTs 12/21/2012  . Abscess of finger, left 04/05/2011  . Chest pain 03/24/2011  . Hematuria, gross 02/26/2011  . Low back pain 02/25/2011  . Pulmonary nodule, right 11/26/2010  . Osteoarthritis of thumb 09/25/2010  . Chest pain syndrome 09/25/2010  . Pulmonary embolism 09/04/2010  . Long term current use of anticoagulant 09/04/2010  . VITAMIN B12 DEFICIENCY 07/23/2010  . PARESTHESIA 06/18/2010  . D V T 05/15/2010  . LYMPHADENOPATHY 05/15/2010  . OBESITY 11/14/2009  . OTITIS EXTERNA 11/14/2009  . SEBORRHEIC DERMATITIS 11/14/2009  . DIARRHEA, ACUTE 10/08/2009  . HYPERLIPIDEMIA 05/15/2009  . OSTEOARTHRITIS 05/15/2009  . OTITIS MEDIA 10/27/2008  . CELLULITIS, HAND, LEFT  10/27/2008  . DEHYDRATION (VOLUME DEPLETION) 04/03/2008  . ASTHMA 08/26/2007  . Neoplasm of Uncertain Behavior of Skin 04/23/2007  . Edema 04/23/2007  . PULMONARY EMBOLISM, HX OF 04/23/2007   Past Medical History  Diagnosis Date  . Warfarin anticoagulation   . LBP (low back pain)   . History of pulmonary embolism 2003    both lungs  . Edema     Right Leg w/ h/o DVT postphlebitic  . Hyperlipidemia   . Osteoarthritis   . Obesity   . Knee pain     left  . Peripheral vascular disease     "poor circulation in  RT leg"  . Anxiety   . Spondylolisthesis   . DVT (deep venous thrombosis) 2003    right femoral artery "from groin to knee"  . Asthma     "no attack since acupuncture in 1990's"  . Wheezing     when laying on left side  . 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 Cyst of left kidney     "benign"  . Headache(784.0)     MIGRAINES-not for a long time  . Complication of anesthesia     SLOW TO WAKE UP / DIFFICULTY RESPONDING 2003, 3 days before could use left arm, "wild/crazy sometimes"  . PONV (postoperative nausea and vomiting)     during surgery in 1990's  . H/O blood transfusion reaction 2004    FFP, extreme swelling, could not breath    Past Surgical History  Procedure Laterality Date  . Lumbar fusion  2003    Dr Jillyn Hidden  . Orchiectomy  1994    R post injury  .  Knee arthroscopy  1990    RT KNEE  . Knee arthroscopy Right 2003  . Knee arthroscopy  1988    L KNEE  . Finger surgery  2012    RT THUMB RECONSTRUCTION  . Knee arthroscopy with meniscal repair Left 10/22/2012    Procedure: LEFT KNEE ARTHROSCOPY PARTIAL MEDIAL AND LATERAL MENISCECTOMY AND DEBRIDEMENT, CHONDROPLASTY ;  Surgeon: Javier Docker, MD;  Location: [facility];  Service: Orthopedics;  Laterality: Left;  . Tonsillectomy  1967  . Appendectomy  1993    Dr Jamey Ripa  . Cholecystectomy  1993  . Hemorroidectomy  1981  . Hand surgery Right     abcess  . Lumbar disc surgery     Current  outpatient prescriptions: albuterol (PROVENTIL HFA;VENTOLIN HFA) 108 (90 BASE) MCG/ACT inhaler, Inhale 2 puffs into the lungs every 6 (six) hours as needed for wheezing., Disp: , Rfl: ;   cetirizine (ZYRTEC) 10 MG tablet, Take 10 mg by mouth daily. , Disp: , Rfl: ;   clotrimazole-betamethasone (LOTRISONE) cream, Apply 1 application topically 2 (two) times daily as needed. Apply to red spots on face, Disp: , Rfl:  diphenoxylate-atropine (LOMOTIL) 2.5-0.025 MG per tablet, Take 1-2 tablets by mouth 4 (four) times daily as needed for diarrhea or loose stools. , Disp: , Rfl: ;   fish oil-omega-3 fatty acids 1000 MG capsule, Take 1 g by mouth daily.  , Disp: , Rfl:  furosemide (LASIX) 20 MG tablet, Take 20 mg by mouth every morning. , Disp: , Rfl: ;   Glucosamine 500 MG CAPS, Take 1 capsule by mouth daily.  , Disp: , Rfl: ;   nortriptyline (PAMELOR) 10 MG capsule, Take 10 mg by mouth every evening., Disp: , Rfl: ;   oxyCODONE (OXY IR/ROXICODONE) 5 MG immediate release tablet, Take 1 tablet (5 mg total) by mouth every 8 (eight) hours as needed for pain. vitamin B-12 (CYANOCOBALAMIN) 1000 MCG tablet, Take 1,000 mcg by mouth daily.  , Disp: , Rfl: ;   warfarin (COUMADIN) 6 MG tablet, Take 3-6 mg by mouth daily. Take 6mg  on all days except take 3mg  on Friday, Disp: , Rfl:   Allergies  Allergen Reactions  . Exalgo [Hydromorphone Hcl] Nausea Only    Sick and nauseated  . Fentanyl Other (See Comments)    REACTION: too sleepy  . Hydrocodone-Acetaminophen Nausea Only    REACTION: nausea  . Penicillins Hives  . Sulfonamide Derivatives Hives and Swelling  . Tramadol Hcl     REACTION: did not help  . Tylenol [Acetaminophen]     Liver issues    History  Substance Use Topics  . Smoking status: Never Smoker   . Smokeless tobacco: Never Used  . Alcohol Use: No    Family History  Problem Relation Age of Onset  . Hyperlipidemia Other   . Hypertension Other   . Parkinsonism Other   . Heart disease  Mother     CABG at age 30     Review of Systems  Constitutional: Positive for malaise/fatigue and diaphoresis. Negative for fever, chills and weight loss.  HENT: Positive for tinnitus. Negative for hearing loss, ear pain, nosebleeds, congestion, sore throat, neck pain and ear discharge.   Eyes: Negative.   Respiratory: Positive for cough and wheezing. Negative for hemoptysis, sputum production, shortness of breath and stridor.   Cardiovascular: Negative.   Gastrointestinal: Positive for diarrhea and constipation. Negative for heartburn, nausea, vomiting, abdominal pain, blood in stool and melena.  Genitourinary: Negative.  Musculoskeletal: Positive for back pain and joint pain. Negative for myalgias and falls.  Skin: Negative.   Neurological: Negative.  Negative for weakness and headaches.  Endo/Heme/Allergies: Positive for environmental allergies.  Psychiatric/Behavioral: Negative.     Objective:  Physical Exam  Constitutional: He is oriented to person, place, and time. He appears well-developed and well-nourished. No distress.  HENT:  Head: Normocephalic and atraumatic.  Right Ear: External ear normal.  Left Ear: External ear normal.  Nose: Nose normal.  Mouth/Throat: Oropharynx is clear and moist.  Eyes: Conjunctivae and EOM are normal.  Neck: Normal range of motion. Neck supple.  Cardiovascular: Normal rate, regular rhythm, normal heart sounds and intact distal pulses.   Respiratory: Effort normal. No respiratory distress. He has wheezes.  Wheezes cleared with cough  GI: Soft. Bowel sounds are normal. He exhibits no distension. There is no tenderness.  Musculoskeletal:       Right hip: Normal.       Left hip: Normal.       Right knee: Normal.       Left knee: He exhibits decreased range of motion, swelling and effusion. He exhibits no erythema. Tenderness found. Medial joint line tenderness noted. No lateral joint line tenderness noted.       Right lower leg: He exhibits  no tenderness and no swelling.       Left lower leg: He exhibits no tenderness and no swelling.   His left knee shows a slight effusion. His ROM is 5-125. There is marked crepitus on ROM. There is significant tenderness medially. No lateral tenderness or instability noted.  Neurological: He is alert and oriented to person, place, and time. He has normal strength and normal reflexes. No sensory deficit.  Skin: No rash noted. He is not diaphoretic. No erythema.  Psychiatric: He has a normal mood and affect. His behavior is normal.   Vitals Weight: 286 lb Height: 72 in Body Surface Area: 2.57 m Body Mass Index: 38.79 kg/m Pulse: 72 (Regular) BP: 132/74 (Sitting, Left Arm, Standard)  Imaging Review Plain radiographs demonstrate severe degenerative joint disease of the left knee(s). The overall alignment ismild varus. The bone quality appears to be good for age and reported activity level.  Assessment/Plan:  End stage arthritis, left knee   The patient history, physical examination, clinical judgment of the provider and imaging studies are consistent with end stage degenerative joint disease of the left knee(s) and total knee arthroplasty is deemed medically necessary. The treatment options including medical management, injection therapy arthroscopy and arthroplasty were discussed at length. The risks and benefits of total knee arthroplasty were presented and reviewed. The risks due to aseptic loosening, infection, stiffness, patella tracking problems, thromboembolic complications and other imponderables were discussed. The patient acknowledged the explanation, agreed to proceed with the plan and consent was signed. Patient is being admitted for inpatient treatment for surgery, pain control, PT, OT, prophylactic antibiotics, VTE prophylaxis, progressive ambulation and ADL's and discharge planning. The patient is planning to be discharged home with home health services   Patient  should not receive TXA      Dimitri Ped, PA-C

## 2013-03-21 ENCOUNTER — Ambulatory Visit (HOSPITAL_COMMUNITY): Payer: 59 | Admitting: Anesthesiology

## 2013-03-21 ENCOUNTER — Encounter (HOSPITAL_COMMUNITY): Payer: Self-pay | Admitting: Anesthesiology

## 2013-03-21 ENCOUNTER — Inpatient Hospital Stay (HOSPITAL_COMMUNITY)
Admission: RE | Admit: 2013-03-21 | Discharge: 2013-03-23 | DRG: 470 | Disposition: A | Discharge status: Home Health | Payer: 59 | Source: Ambulatory Visit | Attending: Orthopedic Surgery | Admitting: Orthopedic Surgery

## 2013-03-21 ENCOUNTER — Encounter (HOSPITAL_COMMUNITY)
Admission: RE | Disposition: A | Discharge status: Home Health | Payer: Self-pay | Source: Ambulatory Visit | Attending: Orthopedic Surgery

## 2013-03-21 ENCOUNTER — Encounter (HOSPITAL_COMMUNITY): Payer: Self-pay | Admitting: *Deleted

## 2013-03-21 DIAGNOSIS — Z7901 Long term (current) use of anticoagulants: Secondary | ICD-10-CM

## 2013-03-21 DIAGNOSIS — E669 Obesity, unspecified: Secondary | ICD-10-CM | POA: Diagnosis present

## 2013-03-21 DIAGNOSIS — E871 Hypo-osmolality and hyponatremia: Secondary | ICD-10-CM | POA: Diagnosis not present

## 2013-03-21 DIAGNOSIS — IMO0002 Reserved for concepts with insufficient information to code with codable children: Principal | ICD-10-CM | POA: Diagnosis present

## 2013-03-21 DIAGNOSIS — D62 Acute posthemorrhagic anemia: Secondary | ICD-10-CM | POA: Diagnosis not present

## 2013-03-21 DIAGNOSIS — M1710 Unilateral primary osteoarthritis, unspecified knee: Secondary | ICD-10-CM | POA: Diagnosis present

## 2013-03-21 DIAGNOSIS — I2782 Chronic pulmonary embolism: Secondary | ICD-10-CM | POA: Diagnosis present

## 2013-03-21 DIAGNOSIS — I82509 Chronic embolism and thrombosis of unspecified deep veins of unspecified lower extremity: Secondary | ICD-10-CM | POA: Diagnosis present

## 2013-03-21 DIAGNOSIS — Z01812 Encounter for preprocedural laboratory examination: Secondary | ICD-10-CM

## 2013-03-21 DIAGNOSIS — M179 Osteoarthritis of knee, unspecified: Secondary | ICD-10-CM | POA: Diagnosis present

## 2013-03-21 DIAGNOSIS — Z6841 Body Mass Index (BMI) 40.0 and over, adult: Secondary | ICD-10-CM

## 2013-03-21 DIAGNOSIS — Z96651 Presence of right artificial knee joint: Secondary | ICD-10-CM

## 2013-03-21 HISTORY — PX: TOTAL KNEE ARTHROPLASTY: SHX125

## 2013-03-21 LAB — PROTIME-INR
INR: 1.14 (ref 0.00–1.49)
Prothrombin Time: 14.4 seconds (ref 11.6–15.2)

## 2013-03-21 LAB — TYPE AND SCREEN
ABO/RH(D): O POS
Antibody Screen: NEGATIVE

## 2013-03-21 SURGERY — ARTHROPLASTY, KNEE, TOTAL
Anesthesia: Spinal | Site: Knee | Laterality: Left | Wound class: Clean

## 2013-03-21 MED ORDER — METHOCARBAMOL 100 MG/ML IJ SOLN
500.0000 mg | Freq: Four times a day (QID) | INTRAMUSCULAR | Status: DC | PRN
  Administered 2013-03-21: 500 mg via INTRAVENOUS
  Filled 2013-03-21 (×2): qty 5

## 2013-03-21 MED ORDER — ACETAMINOPHEN 500 MG PO TABS
1000.0000 mg | ORAL_TABLET | Freq: Four times a day (QID) | ORAL | Status: AC
  Administered 2013-03-21 – 2013-03-22 (×4): 1000 mg via ORAL
  Filled 2013-03-21 (×5): qty 2

## 2013-03-21 MED ORDER — DEXAMETHASONE SODIUM PHOSPHATE 10 MG/ML IJ SOLN
10.0000 mg | Freq: Every day | INTRAMUSCULAR | Status: AC
  Filled 2013-03-21: qty 1

## 2013-03-21 MED ORDER — DIPHENHYDRAMINE HCL 12.5 MG/5ML PO ELIX
12.5000 mg | ORAL_SOLUTION | ORAL | Status: DC | PRN
  Administered 2013-03-22: 25 mg via ORAL
  Filled 2013-03-21: qty 10

## 2013-03-21 MED ORDER — PROMETHAZINE HCL 25 MG/ML IJ SOLN: 6.2500 mg | INTRAMUSCULAR | Status: DC | PRN

## 2013-03-21 MED ORDER — MORPHINE SULFATE 2 MG/ML IJ SOLN: 1.0000 mg | INTRAMUSCULAR | Status: DC | PRN

## 2013-03-21 MED ORDER — PROPOFOL INFUSION 10 MG/ML OPTIME
INTRAVENOUS | Status: DC | PRN
  Administered 2013-03-21: 100 ug/kg/min via INTRAVENOUS

## 2013-03-21 MED ORDER — NORTRIPTYLINE HCL 10 MG PO CAPS
10.0000 mg | ORAL_CAPSULE | Freq: Every evening | ORAL | Status: DC
  Administered 2013-03-21 – 2013-03-22 (×2): 10 mg via ORAL
  Filled 2013-03-21 (×3): qty 1

## 2013-03-21 MED ORDER — POLYETHYLENE GLYCOL 3350 17 G PO PACK: 17.0000 g | PACK | Freq: Every day | ORAL | Status: DC | PRN

## 2013-03-21 MED ORDER — ONDANSETRON HCL 4 MG/2ML IJ SOLN: 4.0000 mg | Freq: Four times a day (QID) | INTRAMUSCULAR | Status: DC | PRN

## 2013-03-21 MED ORDER — BISACODYL 10 MG RE SUPP: 10.0000 mg | Freq: Every day | RECTAL | Status: DC | PRN

## 2013-03-21 MED ORDER — FENTANYL CITRATE 0.05 MG/ML IJ SOLN
INTRAMUSCULAR | Status: DC | PRN
  Administered 2013-03-21: 50 ug via INTRAVENOUS
  Administered 2013-03-21 (×2): 100 ug via INTRAVENOUS

## 2013-03-21 MED ORDER — BUPIVACAINE HCL (PF) 0.25 % IJ SOLN
INTRAMUSCULAR | Status: AC
  Filled 2013-03-21: qty 30

## 2013-03-21 MED ORDER — SUCCINYLCHOLINE CHLORIDE 20 MG/ML IJ SOLN
INTRAMUSCULAR | Status: DC | PRN
  Administered 2013-03-21: 140 mg via INTRAVENOUS

## 2013-03-21 MED ORDER — DIPHENOXYLATE-ATROPINE 2.5-0.025 MG PO TABS: 1.0000 | ORAL_TABLET | Freq: Four times a day (QID) | ORAL | Status: DC | PRN

## 2013-03-21 MED ORDER — ALBUTEROL SULFATE HFA 108 (90 BASE) MCG/ACT IN AERS
2.0000 | INHALATION_SPRAY | Freq: Four times a day (QID) | RESPIRATORY_TRACT | Status: DC | PRN
  Filled 2013-03-21: qty 6.7

## 2013-03-21 MED ORDER — METOCLOPRAMIDE HCL 5 MG/ML IJ SOLN: 5.0000 mg | Freq: Three times a day (TID) | INTRAMUSCULAR | Status: DC | PRN

## 2013-03-21 MED ORDER — ONDANSETRON HCL 4 MG/2ML IJ SOLN
INTRAMUSCULAR | Status: DC | PRN
  Administered 2013-03-21: 4 mg via INTRAVENOUS

## 2013-03-21 MED ORDER — ENOXAPARIN SODIUM 30 MG/0.3ML ~~LOC~~ SOLN
30.0000 mg | Freq: Two times a day (BID) | SUBCUTANEOUS | Status: DC
  Administered 2013-03-22 – 2013-03-23 (×3): 30 mg via SUBCUTANEOUS
  Filled 2013-03-21 (×5): qty 0.3

## 2013-03-21 MED ORDER — FLEET ENEMA 7-19 GM/118ML RE ENEM: 1.0000 | ENEMA | Freq: Once | RECTAL | Status: AC | PRN

## 2013-03-21 MED ORDER — MORPHINE SULFATE 10 MG/ML IJ SOLN
INTRAMUSCULAR | Status: AC
  Filled 2013-03-21: qty 1

## 2013-03-21 MED ORDER — DEXTROSE-NACL 5-0.9 % IV SOLN
INTRAVENOUS | Status: DC
  Administered 2013-03-21 – 2013-03-22 (×2): via INTRAVENOUS

## 2013-03-21 MED ORDER — LACTATED RINGERS IV SOLN
INTRAVENOUS | Status: DC | PRN
  Administered 2013-03-21 (×2): via INTRAVENOUS

## 2013-03-21 MED ORDER — LORATADINE 10 MG PO TABS
10.0000 mg | ORAL_TABLET | Freq: Every day | ORAL | Status: DC
  Administered 2013-03-21 – 2013-03-23 (×3): 10 mg via ORAL
  Filled 2013-03-21 (×3): qty 1

## 2013-03-21 MED ORDER — HYDROMORPHONE HCL PF 1 MG/ML IJ SOLN
INTRAMUSCULAR | Status: DC | PRN
  Administered 2013-03-21: 1 mg via INTRAVENOUS

## 2013-03-21 MED ORDER — PROPOFOL 10 MG/ML IV EMUL
INTRAVENOUS | Status: DC | PRN
  Administered 2013-03-21: 200 mg via INTRAVENOUS

## 2013-03-21 MED ORDER — DEXAMETHASONE SODIUM PHOSPHATE 10 MG/ML IJ SOLN
10.0000 mg | Freq: Once | INTRAMUSCULAR | Status: AC
  Administered 2013-03-21: 10 mg via INTRAVENOUS

## 2013-03-21 MED ORDER — WARFARIN - PHARMACIST DOSING INPATIENT: Freq: Once | Status: DC

## 2013-03-21 MED ORDER — BUPIVACAINE HCL 0.25 % IJ SOLN
INTRAMUSCULAR | Status: DC | PRN
  Administered 2013-03-21: 20 mL

## 2013-03-21 MED ORDER — WARFARIN - PHARMACIST DOSING INPATIENT: Freq: Every day | Status: DC

## 2013-03-21 MED ORDER — 0.9 % SODIUM CHLORIDE (POUR BTL) OPTIME
TOPICAL | Status: DC | PRN
  Administered 2013-03-21: 1000 mL

## 2013-03-21 MED ORDER — DEXAMETHASONE 6 MG PO TABS
10.0000 mg | ORAL_TABLET | Freq: Every day | ORAL | Status: AC
  Administered 2013-03-22: 10:00:00 10 mg via ORAL
  Filled 2013-03-21: qty 1

## 2013-03-21 MED ORDER — KETAMINE HCL 50 MG/ML IJ SOLN
INTRAMUSCULAR | Status: DC | PRN
  Administered 2013-03-21 (×5): 10 mg via INTRAMUSCULAR

## 2013-03-21 MED ORDER — MIDAZOLAM HCL 5 MG/5ML IJ SOLN
INTRAMUSCULAR | Status: DC | PRN
  Administered 2013-03-21: 2 mg via INTRAVENOUS

## 2013-03-21 MED ORDER — METOCLOPRAMIDE HCL 10 MG PO TABS: 5.0000 mg | ORAL_TABLET | Freq: Three times a day (TID) | ORAL | Status: DC | PRN

## 2013-03-21 MED ORDER — SODIUM CHLORIDE 0.9 % IJ SOLN
INTRAMUSCULAR | Status: DC | PRN
  Administered 2013-03-21: 14:00:00

## 2013-03-21 MED ORDER — WARFARIN SODIUM 7.5 MG PO TABS
7.5000 mg | ORAL_TABLET | Freq: Once | ORAL | Status: DC
  Filled 2013-03-21: qty 1

## 2013-03-21 MED ORDER — MORPHINE SULFATE 10 MG/ML IJ SOLN
1.0000 mg | INTRAMUSCULAR | Status: DC | PRN
  Administered 2013-03-21 (×5): 2 mg via INTRAVENOUS

## 2013-03-21 MED ORDER — DOCUSATE SODIUM 100 MG PO CAPS
100.0000 mg | ORAL_CAPSULE | Freq: Two times a day (BID) | ORAL | Status: DC
  Administered 2013-03-21 – 2013-03-23 (×4): 100 mg via ORAL

## 2013-03-21 MED ORDER — OXYCODONE HCL 5 MG PO TABS
5.0000 mg | ORAL_TABLET | ORAL | Status: DC | PRN
  Administered 2013-03-21 (×2): 10 mg via ORAL
  Administered 2013-03-22: 01:00:00 15 mg via ORAL
  Filled 2013-03-21 (×2): qty 3
  Filled 2013-03-21 (×3): qty 2

## 2013-03-21 MED ORDER — SODIUM CHLORIDE 0.9 % IJ SOLN
INTRAMUSCULAR | Status: AC
  Filled 2013-03-21: qty 50

## 2013-03-21 MED ORDER — ACETAMINOPHEN 500 MG PO TABS: 1000.0000 mg | ORAL_TABLET | Freq: Once | ORAL | Status: DC

## 2013-03-21 MED ORDER — WARFARIN SODIUM 7.5 MG PO TABS
7.5000 mg | ORAL_TABLET | Freq: Once | ORAL | Status: AC
  Administered 2013-03-21: 7.5 mg via ORAL
  Filled 2013-03-21: qty 1

## 2013-03-21 MED ORDER — FUROSEMIDE 20 MG PO TABS
20.0000 mg | ORAL_TABLET | Freq: Every morning | ORAL | Status: DC
  Administered 2013-03-22 – 2013-03-23 (×2): 20 mg via ORAL
  Filled 2013-03-21 (×2): qty 1

## 2013-03-21 MED ORDER — SODIUM CHLORIDE 0.9 % IR SOLN
Status: DC | PRN
  Administered 2013-03-21: 1000 mL

## 2013-03-21 MED ORDER — METHOCARBAMOL 500 MG PO TABS
500.0000 mg | ORAL_TABLET | Freq: Four times a day (QID) | ORAL | Status: DC | PRN
  Administered 2013-03-21 – 2013-03-23 (×6): 500 mg via ORAL
  Filled 2013-03-21 (×7): qty 1

## 2013-03-21 MED ORDER — VANCOMYCIN HCL IN DEXTROSE 1-5 GM/200ML-% IV SOLN
1000.0000 mg | Freq: Two times a day (BID) | INTRAVENOUS | Status: AC
  Administered 2013-03-22: 01:00:00 1000 mg via INTRAVENOUS
  Filled 2013-03-21: qty 200

## 2013-03-21 MED ORDER — MENTHOL 3 MG MT LOZG
1.0000 | LOZENGE | OROMUCOSAL | Status: DC | PRN
  Filled 2013-03-21: qty 9

## 2013-03-21 MED ORDER — BUPIVACAINE LIPOSOME 1.3 % IJ SUSP
20.0000 mL | Freq: Once | INTRAMUSCULAR | Status: DC
  Filled 2013-03-21: qty 20

## 2013-03-21 MED ORDER — PHENOL 1.4 % MT LIQD
1.0000 | OROMUCOSAL | Status: DC | PRN
  Filled 2013-03-21: qty 177

## 2013-03-21 MED ORDER — ONDANSETRON HCL 4 MG PO TABS: 4.0000 mg | ORAL_TABLET | Freq: Four times a day (QID) | ORAL | Status: DC | PRN

## 2013-03-21 SURGICAL SUPPLY — 55 items
BAG SPEC THK2 15X12 ZIP CLS (MISCELLANEOUS) ×1
BAG ZIPLOCK 12X15 (MISCELLANEOUS) ×2 IMPLANT
BANDAGE ELASTIC 6 VELCRO ST LF (GAUZE/BANDAGES/DRESSINGS) ×2 IMPLANT
BANDAGE ESMARK 6X9 LF (GAUZE/BANDAGES/DRESSINGS) ×1 IMPLANT
BLADE SAG 18X100X1.27 (BLADE) ×2 IMPLANT
BLADE SAW SGTL 11.0X1.19X90.0M (BLADE) ×2 IMPLANT
BNDG CMPR 9X6 STRL LF SNTH (GAUZE/BANDAGES/DRESSINGS) ×1
BNDG ESMARK 6X9 LF (GAUZE/BANDAGES/DRESSINGS) ×2
BOWL SMART MIX CTS (DISPOSABLE) ×2 IMPLANT
CAPT RP KNEE ×1 IMPLANT
CEMENT HV SMART SET (Cement) ×4 IMPLANT
CLOTH BEACON ORANGE TIMEOUT ST (SAFETY) ×2 IMPLANT
CUFF TOURN SGL QUICK 34 (TOURNIQUET CUFF) ×2
CUFF TRNQT CYL 34X4X40X1 (TOURNIQUET CUFF) ×1 IMPLANT
DECANTER SPIKE VIAL GLASS SM (MISCELLANEOUS) ×2 IMPLANT
DRAPE EXTREMITY T 121X128X90 (DRAPE) ×2 IMPLANT
DRAPE POUCH INSTRU U-SHP 10X18 (DRAPES) ×2 IMPLANT
DRAPE U-SHAPE 47X51 STRL (DRAPES) ×2 IMPLANT
DRSG ADAPTIC 3X8 NADH LF (GAUZE/BANDAGES/DRESSINGS) ×2 IMPLANT
DRSG PAD ABDOMINAL 8X10 ST (GAUZE/BANDAGES/DRESSINGS) ×2 IMPLANT
DURAPREP 26ML APPLICATOR (WOUND CARE) ×2 IMPLANT
ELECT REM PT RETURN 9FT ADLT (ELECTROSURGICAL) ×2
ELECTRODE REM PT RTRN 9FT ADLT (ELECTROSURGICAL) ×1 IMPLANT
EVACUATOR 1/8 PVC DRAIN (DRAIN) ×2 IMPLANT
FACESHIELD LNG OPTICON STERILE (SAFETY) ×10 IMPLANT
GLOVE BIO SURGEON STRL SZ7.5 (GLOVE) ×1 IMPLANT
GLOVE BIO SURGEON STRL SZ8 (GLOVE) ×2 IMPLANT
GLOVE BIOGEL PI IND STRL 8 (GLOVE) ×2 IMPLANT
GLOVE BIOGEL PI INDICATOR 8 (GLOVE) ×2
GOWN PREVENTION PLUS LG XLONG (DISPOSABLE) ×2 IMPLANT
GOWN STRL REIN XL XLG (GOWN DISPOSABLE) ×2 IMPLANT
HANDPIECE INTERPULSE COAX TIP (DISPOSABLE) ×2
IMMOBILIZER KNEE 20 (SOFTGOODS) ×2
IMMOBILIZER KNEE 20 THIGH 36 (SOFTGOODS) ×1 IMPLANT
KIT BASIN OR (CUSTOM PROCEDURE TRAY) ×2 IMPLANT
MANIFOLD NEPTUNE II (INSTRUMENTS) ×2 IMPLANT
NDL SAFETY ECLIPSE 18X1.5 (NEEDLE) ×2 IMPLANT
NEEDLE HYPO 18GX1.5 SHARP (NEEDLE) ×4
NS IRRIG 1000ML POUR BTL (IV SOLUTION) ×2 IMPLANT
PACK TOTAL JOINT (CUSTOM PROCEDURE TRAY) ×2 IMPLANT
PADDING CAST COTTON 6X4 STRL (CAST SUPPLIES) ×6 IMPLANT
POSITIONER SURGICAL ARM (MISCELLANEOUS) ×2 IMPLANT
SET HNDPC FAN SPRY TIP SCT (DISPOSABLE) ×1 IMPLANT
SPONGE GAUZE 4X4 12PLY (GAUZE/BANDAGES/DRESSINGS) ×2 IMPLANT
STRIP CLOSURE SKIN 1/2X4 (GAUZE/BANDAGES/DRESSINGS) ×3 IMPLANT
SUCTION FRAZIER 12FR DISP (SUCTIONS) ×2 IMPLANT
SUT MNCRL AB 4-0 PS2 18 (SUTURE) ×2 IMPLANT
SUT VIC AB 2-0 CT1 27 (SUTURE) ×6
SUT VIC AB 2-0 CT1 TAPERPNT 27 (SUTURE) ×3 IMPLANT
SYR 20CC LL (SYRINGE) ×2 IMPLANT
SYR 50ML LL SCALE MARK (SYRINGE) ×2 IMPLANT
TOWEL OR 17X26 10 PK STRL BLUE (TOWEL DISPOSABLE) ×4 IMPLANT
TRAY FOLEY CATH 14FRSI W/METER (CATHETERS) ×2 IMPLANT
WATER STERILE IRR 1500ML POUR (IV SOLUTION) ×3 IMPLANT
WRAP KNEE MAXI GEL POST OP (GAUZE/BANDAGES/DRESSINGS) ×2 IMPLANT

## 2013-03-21 NOTE — Op Note (Signed)
Pre-operative diagnosis- Osteoarthritis  Left knee(s)  Post-operative diagnosis- Osteoarthritis Left knee(s)  Procedure-  Left  Total Knee Arthroplasty  Surgeon- Gus Rankin. Dorse Locy, MD  Assistant- Avel Peace, PA-C   Anesthesia-  General  EBL-* No blood loss amount entered *   Drains Hemovac  Tourniquet time-  Total Tourniquet Time Documented: Thigh (Left) - 37 minutes Total: Thigh (Left) - 37 minutes    Complications- None  Condition-PACU - hemodynamically stable.   Brief Clinical Note   Robert [name] is a 62 y.o. year old male with end stage OA of his left knee with progressively worsening pain and dysfunction. He has constant pain, with activity and at rest and significant functional deficits with difficulties even with ADLs. He has had extensive non-op management including analgesics, arthroscopy, injections of cortisone, and home exercise program, but remains in significant pain with significant dysfunction. Radiographs and arthroscopic photos show bone on bone arthritis medial compartment. He presents now for left Total Knee Arthroplasty.     Procedure in detail---   The patient is brought into the operating room and positioned supine on the operating table. After successful administration of  General,   a tourniquet is placed high on the  Left thigh(s) and the lower extremity is prepped and draped in the usual sterile fashion. Time out is performed by the operating team and then the  Left lower extremity is wrapped in Esmarch, knee flexed and the tourniquet inflated to 300 mmHg.       A midline incision is made with a ten blade through the subcutaneous tissue to the level of the extensor mechanism. A fresh blade is used to make a medial parapatellar arthrotomy. Soft tissue over the proximal medial tibia is subperiosteally elevated to the joint line with a knife and into the semimembranosus bursa with a Cobb elevator. Soft tissue over the proximal lateral tibia is elevated  with attention being paid to avoiding the patellar tendon on the tibial tubercle. The patella is everted, knee flexed 90 degrees and the ACL and PCL are removed. Findings are exposed bone medial tibia and femur with large osteophyte formation and significant synovitis.        The drill is used to create a starting hole in the distal femur and the canal is thoroughly irrigated with sterile saline to remove the fatty contents. The 5 degree Left  valgus alignment guide is placed into the femoral canal and the distal femoral cutting block is pinned to remove 10 mm off the distal femur. Resection is made with an oscillating saw.      The tibia is subluxed forward and the menisci are removed. The extramedullary alignment guide is placed referencing proximally at the medial aspect of the tibial tubercle and distally along the second metatarsal axis and tibial crest. The block is pinned to remove 2mm off the more deficient medial  side. Resection is made with an oscillating saw. Size 5is the most appropriate size for the tibia and the proximal tibia is prepared with the modular drill and keel punch for that size.      The femoral sizing guide is placed and size 5 is most appropriate. Rotation is marked off the epicondylar axis and confirmed by creating a rectangular flexion gap at 90 degrees. The size 5 cutting block is pinned in this rotation and the anterior, posterior and chamfer cuts are made with the oscillating saw. The intercondylar block is then placed and that cut is made.  Trial size 5 tibial component, trial size 5 posterior stabilized femur and a 12.5  mm posterior stabilized rotating platform insert trial is placed. Full extension is achieved with excellent varus/valgus and anterior/posterior balance throughout full range of motion. The patella is everted and thickness measured to be 27  mm. Free hand resection is taken to 15 mm, a 41 template is placed, lug holes are drilled, trial patella is placed,  and it tracks normally. Osteophytes are removed off the posterior femur with the trial in place. All trials are removed and the cut bone surfaces prepared with pulsatile lavage. Cement is mixed and once ready for implantation, the size 5 tibial implant, size  5 posterior stabilized femoral component, and the size 41 patella are cemented in place and the patella is held with the clamp. The trial insert is placed and the knee held in full extension. The Exparel (20 ml mixed with 30 ml saline) and .25% Bupivicaine, are injected into the extensor mechanism, posterior capsule, medial and lateral gutters and subcutaneous tissues.  All extruded cement is removed and once the cement is hard the permanent 12.5 mm posterior stabilized rotating platform insert is placed into the tibial tray.      The wound is copiously irrigated with saline solution and the extensor mechanism closed over a hemovac drain with #1 PDS suture. The tourniquet is released for a total tourniquet time of 37  minutes. Flexion against gravity is 140 degrees and the patella tracks normally. Subcutaneous tissue is closed with 2.0 vicryl and subcuticular with running 4.0 Monocryl. The incision is cleaned and dried and steri-strips and a bulky sterile dressing are applied. The limb is placed into a knee immobilizer and the patient is awakened and transported to recovery in stable condition.      Please note that a surgical assistant was a medical necessity for this procedure in order to perform it in a safe and expeditious manner. Surgical assistant was necessary to retract the ligaments and vital neurovascular structures to prevent injury to them and also necessary for proper positioning of the limb to allow for anatomic placement of the prosthesis.   Gus Rankin Elmire Amrein, MD    03/21/2013, 2:18 PM

## 2013-03-21 NOTE — Transfer of Care (Signed)
Immediate Anesthesia Transfer of Care Note  Patient: Robert [name]  Procedure(s) Performed: Procedure(s): LEFT TOTAL KNEE ARTHROPLASTY (Left)  Patient Location: PACU  Anesthesia Type:General  Level of Consciousness: awake, alert  and oriented  Airway & Oxygen Therapy: Patient Spontanous Breathing and Patient connected to face mask oxygen  Post-op Assessment: Report given to PACU RN and Post -op Vital signs reviewed and stable  Post vital signs: Reviewed and stable  Complications: No apparent anesthesia complications

## 2013-03-21 NOTE — Preoperative (Signed)
Beta Blockers   Reason not to administer Beta Blockers:Not Applicable 

## 2013-03-21 NOTE — Progress Notes (Signed)
ANTICOAGULATION CONSULT NOTE - Initial Consult  Pharmacy Consult for Warfarin Indication: VTE prophylaxis  Allergies  Allergen Reactions  . Exalgo [Hydromorphone Hcl] Nausea Only    Sick and nauseated  . Fentanyl Other (See Comments)    REACTION: too sleepy  . Hydrocodone-Acetaminophen Nausea Only    REACTION: nausea  . Penicillins Hives  . Sulfonamide Derivatives Hives and Swelling  . Tramadol Hcl     REACTION: did not help  . Tylenol [Acetaminophen]     Liver issues    Patient Measurements:    Vital Signs: Temp: 97.9 F (36.6 C) (09/29 1558) BP: 149/88 mmHg (09/29 1558) Pulse Rate: 88 (09/29 1558)  Labs:  Recent Labs  03/21/13 1035  LABPROT 14.4  INR 1.14    The CrCl is unknown because both a height and weight (above a minimum accepted value) are required for this calculation.   Medical History: Past Medical History  Diagnosis Date  . Warfarin anticoagulation   . LBP (low back pain)   . History of pulmonary embolism 2003    both lungs  . Edema     Right Leg w/ h/o DVT postphlebitic  . Hyperlipidemia   . Osteoarthritis   . Obesity   . Knee pain     left  . Peripheral vascular disease     "poor circulation in  RT leg"  . Anxiety   . Spondylolisthesis   . DVT (deep venous thrombosis) 2003    right femoral artery "from groin to knee"  . Asthma     "no attack since acupuncture in 1990's"  . Wheezing     when laying on left side  . 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 Cyst of left kidney     "benign"  . Headache(784.0)     MIGRAINES-not for a long time  . Complication of anesthesia     SLOW TO WAKE UP / DIFFICULTY RESPONDING 2003, 3 days before could use left arm, "wild/crazy sometimes"  . PONV (postoperative nausea and vomiting)     during surgery in 1990's  . H/O blood transfusion reaction 2004    FFP, extreme swelling, could not breath     Assessment: 62 yoM on chronic warfarin therapy PTA for history of VTE.   Warfarin has been on hold for left TKA.  Patient is now s/p left TKA 9/29 and warfarin to be resumed tonight.  Warfarin dose PTA = 6 mg daily except takes 3 mg on Fridays  INR 1.14 this AM.  Last dose of warfarin was 9/24.  CBC WNL pre-op.  Lovenox 30 mg q12h ordered to begin 9/30 AM and to continue until INR >/= 1.8  Goal of Therapy:  INR 2-3 Monitor platelets by anticoagulation protocol: Yes   Plan:  1.  Warfarin 7.5 mg PO once tonight. 2.  Daily INR.  Clance Boll 03/21/2013,4:47 PM

## 2013-03-21 NOTE — Progress Notes (Signed)
Utilization review completed.  

## 2013-03-21 NOTE — Anesthesia Preprocedure Evaluation (Addendum)
Anesthesia Evaluation  Patient identified by MRN, date of birth, ID band Patient awake    Reviewed: Allergy & Precautions, H&P , NPO status , Patient's Chart, lab work & pertinent test results  History of Anesthesia Complications (+) PONV  Airway Mallampati: II TM Distance: >3 FB Neck ROM: Full    Dental no notable dental hx.    Pulmonary asthma ,  CXR: NAD breath sounds clear to auscultation  Pulmonary exam normal       Cardiovascular + Peripheral Vascular Disease Rhythm:Regular Rate:Normal  ECG: normal  Stress ECHO 03-27-11   Neuro/Psych  Headaches, Anxiety H/O paresthesia.    GI/Hepatic negative GI ROS, Neg liver ROS,   Endo/Other  negative endocrine ROSMorbid obesity  Renal/GU Renal disease  negative genitourinary   Musculoskeletal negative musculoskeletal ROS (+)   Abdominal (+) + obese,   Peds negative pediatric ROS (+)  Hematology  (+) Blood dyscrasia, ,   Anesthesia Other Findings   Reproductive/Obstetrics negative OB ROS                           Anesthesia Physical Anesthesia Plan  ASA: III  Anesthesia Plan: Spinal   Post-op Pain Management:    Induction: Intravenous  Airway Management Planned:   Additional Equipment:   Intra-op Plan:   Post-operative Plan: Extubation in OR  Informed Consent: I have reviewed the patients History and Physical, chart, labs and discussed the procedure including the risks, benefits and alternatives for the proposed anesthesia with the patient or authorized representative who has indicated his/her understanding and acceptance.   Dental advisory given  Plan Discussed with: CRNA  Anesthesia Plan Comments: (Long discussion of general versus spinal anesthesia. Last lovenox dose was yesterday 9-28 at 0800 AM. Kidney function normal. Last dose of warfarin was last Wednesday. INR 1.14 today. He has had a fusion at L4-5 2003 which was  followed by a DVT and PE. Patient prefers spinal. He does have sciatica bilaterally, decreased sensation in fingers and toes. Discussed risks/benefits of spinal including headache, backache, failure, bleeding, infection, and nerve damage. Patient consents to spinal. Questions answered. Coagulation studies and platelet count acceptable. Probable increased risk of failure of spinal and patient understands this. )       Anesthesia Quick Evaluation

## 2013-03-21 NOTE — Interval H&P Note (Signed)
History and Physical Interval Note:  03/21/2013 11:47 AM  Robert [name]  has presented today for surgery, with the diagnosis of OA LEFT KNEE  The various methods of treatment have been discussed with the patient and family. After consideration of risks, benefits and other options for treatment, the patient has consented to  Procedure(s): LEFT TOTAL KNEE ARTHROPLASTY (Left) as a surgical intervention .  The patient's history has been reviewed, patient examined, no change in status, stable for surgery.  I have reviewed the patient's chart and labs.  Questions were answered to the patient's satisfaction.     Loanne Drilling

## 2013-03-21 NOTE — Anesthesia Procedure Notes (Signed)
Spinal  Patient location during procedure: OR Staffing Performed by: anesthesiologist  Preanesthetic Checklist Completed: patient identified, site marked, surgical consent, pre-op evaluation, timeout performed, IV checked, risks and benefits discussed and monitors and equipment checked Spinal Block Patient position: sitting Prep: Betadine Patient monitoring: heart rate, continuous pulse ox and blood pressure Approach: midline Location: L3-4 Injection technique: single-shot Needle Needle type: Sprotte  Needle gauge: 24 G Needle length: 12.7 cm Additional Notes Expiration date of kit checked and confirmed. Unable to obtain CSF at 3 sites. No paresthesias. General induced.

## 2013-03-22 ENCOUNTER — Encounter (HOSPITAL_COMMUNITY): Payer: Self-pay | Admitting: Orthopedic Surgery

## 2013-03-22 DIAGNOSIS — D62 Acute posthemorrhagic anemia: Secondary | ICD-10-CM | POA: Diagnosis not present

## 2013-03-22 DIAGNOSIS — E871 Hypo-osmolality and hyponatremia: Secondary | ICD-10-CM | POA: Diagnosis not present

## 2013-03-22 LAB — PROTIME-INR
INR: 1.16 (ref 0.00–1.49)
Prothrombin Time: 14.6 seconds (ref 11.6–15.2)

## 2013-03-22 LAB — CBC
HCT: 37.1 % — ABNORMAL LOW (ref 39.0–52.0)
Hemoglobin: 12.9 g/dL — ABNORMAL LOW (ref 13.0–17.0)
MCH: 31.4 pg (ref 26.0–34.0)
MCHC: 34.8 g/dL (ref 30.0–36.0)
MCV: 90.3 fL (ref 78.0–100.0)
Platelets: 263 10*3/uL (ref 150–400)
RBC: 4.11 MIL/uL — ABNORMAL LOW (ref 4.22–5.81)
RDW: 12.6 % (ref 11.5–15.5)
WBC: 16.6 10*3/uL — ABNORMAL HIGH (ref 4.0–10.5)

## 2013-03-22 LAB — BASIC METABOLIC PANEL
BUN: 8 mg/dL (ref 6–23)
CO2: 25 mEq/L (ref 19–32)
Calcium: 8.5 mg/dL (ref 8.4–10.5)
Chloride: 98 mEq/L (ref 96–112)
Creatinine, Ser: 0.8 mg/dL (ref 0.50–1.35)
GFR calc Af Amer: >90 mL/min (ref >90)
GFR calc non Af Amer: >90 mL/min (ref >90)
Glucose, Bld: 150 mg/dL — ABNORMAL HIGH (ref 70–99)
Potassium: 4.2 mEq/L (ref 3.5–5.1)
Sodium: 132 mEq/L — ABNORMAL LOW (ref 135–145)

## 2013-03-22 MED ORDER — HYDROMORPHONE HCL 2 MG PO TABS
2.0000 mg | ORAL_TABLET | ORAL | Status: DC | PRN
  Administered 2013-03-22 (×3): 4 mg via ORAL
  Administered 2013-03-22: 10:00:00 2 mg via ORAL
  Administered 2013-03-22 – 2013-03-23 (×4): 4 mg via ORAL
  Filled 2013-03-22 (×6): qty 2
  Filled 2013-03-22: qty 1
  Filled 2013-03-22: qty 2

## 2013-03-22 MED ORDER — WARFARIN SODIUM 7.5 MG PO TABS
7.5000 mg | ORAL_TABLET | Freq: Once | ORAL | Status: AC
  Administered 2013-03-22: 17:00:00 7.5 mg via ORAL
  Filled 2013-03-22: qty 1

## 2013-03-22 NOTE — Progress Notes (Signed)
Physical Therapy Treatment Patient Details Name: Robert [name] MRN: [id number] DOB: 02-20-1951 Today's Date: 03/22/2013 Time: 4098-1191 PT Time Calculation (min): 25 min  PT Assessment / Plan / Recommendation  History of Present Illness LTKA   PT Comments   Pt reports more discomfort in thigh and behind knee. Plans Dc tomorrow.  Follow Up Recommendations  Home health PT     Does the patient have the potential to tolerate intense rehabilitation     Barriers to Discharge        Equipment Recommendations       Recommendations for Other Services    Frequency 7X/week   Progress towards PT Goals Progress towards PT goals: Progressing toward goals  Plan Current plan remains appropriate    Precautions / Restrictions Precautions Precautions: Knee   Pertinent Vitals/Pain 5 , premedicated.    Mobility  Bed Mobility Bed Mobility: Sit to Supine Sit to Supine: 4: Min guard Transfers Sit to Stand: 4: Min guard;With upper extremity assist;From chair/3-in-1 Stand to Sit: 4: Min guard;With upper extremity assist;To bed Details for Transfer Assistance: verbal cues for safety and use of UE's, placement of LLE Ambulation/Gait Ambulation/Gait Assistance: 4: Min guard Ambulation Distance (Feet): 150 Feet Ambulation/Gait Assistance Details: pt in more discomfort so shortened walk Gait Pattern: Step-through pattern;Antalgic    Exercises     PT Diagnosis:    PT Problem List:   PT Treatment Interventions:     PT Goals (current goals can now be found in the care plan section)    Visit Information  Last PT Received On: 03/22/13 Assistance Needed: +1 History of Present Illness: LTKA    Subjective Data      Cognition  Cognition Arousal/Alertness: Awake/alert    Balance     End of Session PT - End of Session Activity Tolerance: Patient tolerated treatment well Patient left: in bed;with family/visitor present;with call bell/phone within reach Nurse Communication: Mobility  status CPM Left Knee CPM Left Knee: Off   GP     Rada Hay 03/22/2013, 6:30 PM

## 2013-03-22 NOTE — Anesthesia Postprocedure Evaluation (Signed)
  Anesthesia Post-op Note  Patient: Robert [name]  Procedure(s) Performed: Procedure(s) (LRB): LEFT TOTAL KNEE ARTHROPLASTY (Left)  Patient Location: PACU  Anesthesia Type: General  Level of Consciousness: awake and alert   Airway and Oxygen Therapy: Patient Spontanous Breathing  Post-op Pain: mild  Post-op Assessment: Post-op Vital signs reviewed, Patient's Cardiovascular Status Stable, Respiratory Function Stable, Patent Airway and No signs of Nausea or vomiting  Last Vitals:  Filed Vitals:   03/22/13 0623  BP: 133/65  Pulse: 71  Temp: 36.4 C  Resp: 16    Post-op Vital Signs: stable   Complications: No apparent anesthesia complications. No problems in PACU. Pain controlled. Moving both feet. No complaints of back pain.

## 2013-03-22 NOTE — Progress Notes (Signed)
Subjective: 1 Day Post-Op Procedure(s) (LRB): LEFT TOTAL KNEE ARTHROPLASTY (Left) Patient reports pain as mild.   Patient seen in rounds with Dr. Lequita Halt.  wife in room art bedside. Patient is well, but has had some minor complaints of pain in the knee, requiring pain medications We will start therapy today.  Plan is to go Home after hospital stay.  Objective: Vital signs in last 24 hours: Temp:  [97.5 F (36.4 C)-99.4 F (37.4 C)] 97.5 F (36.4 C) (09/30 1610) Pulse Rate:  [71-96] 71 (09/30 0623) Resp:  [15-21] 16 (09/30 0623) BP: (124-157)/(65-96) 133/65 mmHg (09/30 0623) SpO2:  [96 %-100 %] 97 % (09/30 0623) Weight:  [131.543 kg (290 lb)] 131.543 kg (290 lb) (09/29 1558)  Intake/Output from previous day:  Intake/Output Summary (Last 24 hours) at 03/22/13 0809 Last data filed at 03/22/13 9604  Gross per 24 hour  Intake   4665 ml  Output   3870 ml  Net    795 ml    Intake/Output this shift: UOP 1900 since MN  Labs:  Recent Labs  03/22/13 0431  HGB 12.9*    Recent Labs  03/22/13 0431  WBC 16.6*  RBC 4.11*  HCT 37.1*  PLT 263    Recent Labs  03/22/13 0431  NA 132*  K 4.2  CL 98  CO2 25  BUN 8  CREATININE 0.80  GLUCOSE 150*  CALCIUM 8.5    Recent Labs  03/21/13 1035 03/22/13 0431  INR 1.14 1.16    EXAM General - Patient is Alert, Appropriate and Oriented Extremity - Neurovascular intact Sensation intact distally Dorsiflexion/Plantar flexion intact Dressing - dressing C/D/I Motor Function - intact, moving foot and toes well on exam.  Hemovac pulled without difficulty.  Past Medical History  Diagnosis Date  . Warfarin anticoagulation   . LBP (low back pain)   . History of pulmonary embolism 2003    both lungs  . Edema     Right Leg w/ h/o DVT postphlebitic  . Hyperlipidemia   . Osteoarthritis   . Obesity   . Knee pain     left  . Peripheral vascular disease     "poor circulation in  RT leg"  . Anxiety   . Spondylolisthesis    . DVT (deep venous thrombosis) 2003    right femoral artery "from groin to knee"  . Asthma     "no attack since acupuncture in 1990's"  . Wheezing     when laying on left side  . 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 Cyst of left kidney     "benign"  . Headache(784.0)     MIGRAINES-not for a long time  . Complication of anesthesia     SLOW TO WAKE UP / DIFFICULTY RESPONDING 2003, 3 days before could use left arm, "wild/crazy sometimes"  . PONV (postoperative nausea and vomiting)     during surgery in 1990's  . H/O blood transfusion reaction 2004    FFP, extreme swelling, could not breath    Assessment/Plan: 1 Day Post-Op Procedure(s) (LRB): LEFT TOTAL KNEE ARTHROPLASTY (Left) Principal Problem:   OA (osteoarthritis) of knee Active Problems:   Postoperative anemia due to acute blood loss   Hyponatremia  Estimated body mass index is 40.46 kg/(m^2) as calculated from the following:   Height as of this encounter: 5\' 11"  (1.803 m).   Weight as of this encounter: 131.543 kg (290 lb). Advance diet Up with therapy  Discharge home with home health  DVT Prophylaxis - Lovenox and Coumadin Weight-Bearing as tolerated to left leg No vaccines. D/C O2 and Pulse OX and try on Room Air  Carmilla Granville 03/22/2013, 8:09 AM

## 2013-03-22 NOTE — Progress Notes (Signed)
ANTICOAGULATION CONSULT NOTE - Initial Consult  Pharmacy Consult for Warfarin Indication: VTE prophylaxis, h/o PE  Allergies  Allergen Reactions  . Exalgo [Hydromorphone Hcl] Nausea Only    Sick and nauseated  . Fentanyl Other (See Comments)    REACTION: too sleepy  . Hydrocodone-Acetaminophen Nausea Only    REACTION: nausea  . Penicillins Hives  . Sulfonamide Derivatives Hives and Swelling  . Tylenol [Acetaminophen]     Liver issues    Patient Measurements: Height: 5\' 11"  (180.3 cm) Weight: 290 lb (131.543 kg) IBW/kg (Calculated) : 75.3  Vital Signs: Temp: 97.6 F (36.4 C) (09/30 1025) Temp src: Oral (09/30 0623) BP: 124/68 mmHg (09/30 1025) Pulse Rate: 76 (09/30 1025)  Labs:  Recent Labs  03/21/13 1035 03/22/13 0431  HGB  --  12.9*  HCT  --  37.1*  PLT  --  263  LABPROT 14.4 14.6  INR 1.14 1.16  CREATININE  --  0.80    Estimated Creatinine Clearance: 134.1 ml/min (by C-G formula based on Cr of 0.8).   Medical History: Past Medical History  Diagnosis Date  . Warfarin anticoagulation   . LBP (low back pain)   . History of pulmonary embolism 2003    both lungs  . Edema     Right Leg w/ h/o DVT postphlebitic  . Hyperlipidemia   . Osteoarthritis   . Obesity   . Knee pain     left  . Peripheral vascular disease     "poor circulation in  RT leg"  . Anxiety   . Spondylolisthesis   . DVT (deep venous thrombosis) 2003    right femoral artery "from groin to knee"  . Asthma     "no attack since acupuncture in 1990's"  . Wheezing     when laying on left side  . 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 Cyst of left kidney     "benign"  . Headache(784.0)     MIGRAINES-not for a long time  . Complication of anesthesia     SLOW TO WAKE UP / DIFFICULTY RESPONDING 2003, 3 days before could use left arm, "wild/crazy sometimes"  . PONV (postoperative nausea and vomiting)     during surgery in 1990's  . H/O blood transfusion reaction  2004    FFP, extreme swelling, could not breath     Assessment: 62 yoM on chronic warfarin therapy PTA for history of VTE.  Warfarin has been on hold for left TKA.  Patient is now s/p left TKA 9/29 and warfarin to be resumed tonight.  Warfarin dose PTA = 6 mg daily except takes 3 mg on Fridays  INR 1.16 this AM, warfarin resumed 9/29 PM  CBC: hgb = 14.5-->12.9 (suspect from ABLA)  Lovenox 30 mg q12h ordered to begin 9/30 AM and to continue until INR >/= 1.8  Goal of Therapy:  INR 2-3 Monitor platelets by anticoagulation protocol: Yes   Plan:  1.  Warfarin 7.5 mg PO once tonight. 2.  Daily INR.  [name], PharmD, BCPS.   Pager: [phone]  03/22/2013,1:19 PM

## 2013-03-22 NOTE — Evaluation (Signed)
Physical Therapy Evaluation Patient Details Name: Robert [name] MRN: [id number] DOB: 1951/02/16 Today's Date: 03/22/2013 Time: 8295-6213 PT Time Calculation (min): 40 min  PT Assessment / Plan / Recommendation History of Present Illness  LTKA  Clinical Impression  Pt ambulated x 300'. Pt will benefit from PT to address problems listed below. Pt will need RW.    PT Assessment  Patient needs continued PT services    Follow Up Recommendations  Home health PT    Does the patient have the potential to tolerate intense rehabilitation      Barriers to Discharge        Equipment Recommendations  Rolling walker with 5" wheels    Recommendations for Other Services     Frequency 7X/week    Precautions / Restrictions Precautions Precautions: Knee Required Braces or Orthoses: Knee Immobilizer - Left Knee Immobilizer - Left: Discontinue once straight leg raise with < 10 degree lag Restrictions Weight Bearing Restrictions: No   Pertinent Vitals/Pain States L thigh is very sore. Had meds, ice applied l knee.      Mobility  Bed Mobility Bed Mobility: Supine to Sit Supine to Sit: 4: Min assist Details for Bed Mobility Assistance: support LLE to lower to floor. Transfers Transfers: Sit to Stand;Stand to Sit Sit to Stand: 4: Min guard;From bed;With upper extremity assist Stand to Sit: 4: Min guard;With upper extremity assist;With armrests Details for Transfer Assistance: verbal cues for safety and use of UE's, placement of LLE Ambulation/Gait Ambulation/Gait Assistance: 4: Min guard Ambulation Distance (Feet): 300 Feet Assistive device: Rolling walker Ambulation/Gait Assistance Details: verbal cues for sequence , posture and safety without KI Gait Pattern: Step-through pattern;Antalgic    Exercises Total Joint Exercises Ankle Circles/Pumps: AROM;Both;10 reps;Supine Quad Sets: AROM;Left;10 reps Short Arc QuadBarbaraann Boys;Left;10 reps Heel Slides: AAROM;Left;10 reps Straight  Leg Raises: AAROM;Left;10 reps   PT Diagnosis: Acute pain;Difficulty walking  PT Problem List: Decreased strength;Decreased range of motion;Decreased activity tolerance;Decreased mobility;Decreased knowledge of precautions;Decreased safety awareness;Decreased knowledge of use of DME;Pain PT Treatment Interventions: DME instruction;Gait training;Stair training;Functional mobility training;Therapeutic activities;Therapeutic exercise;Patient/family education     PT Goals(Current goals can be found in the care plan section) Acute Rehab PT Goals Patient Stated Goal: I want to get this over and hope my back will get better. PT Goal Formulation: With patient/family Time For Goal Achievement: 03/26/13 Potential to Achieve Goals: Good  Visit Information  Last PT Received On: 03/22/13 Assistance Needed: +1 History of Present Illness: LTKA       Prior Functioning  Home Living Family/patient expects to be discharged to:: Private residence Living Arrangements: Spouse/significant other Available Help at Discharge: Family Type of Home: House Home Access: Stairs to enter Secretary/administrator of Steps: 4 Entrance Stairs-Rails: Right;Left Home Layout: One level Home Equipment: Environmental consultant - 2 wheels (needs new RW) Prior Function Level of Independence: Independent Communication Communication: No difficulties    Cognition  Cognition Arousal/Alertness: Awake/alert Behavior During Therapy: WFL for tasks assessed/performed Overall Cognitive Status: Within Functional Limits for tasks assessed    Extremity/Trunk Assessment Lower Extremity Assessment Lower Extremity Assessment: LLE deficits/detail LLE Deficits / Details: able to perform SLR with difficulty due to thigh soreness., knee flexion 40 degrees.   Balance    End of Session PT - End of Session Activity Tolerance: Patient tolerated treatment well Patient left: in chair;with call bell/phone within reach Nurse Communication: Mobility  status CPM Left Knee CPM Left Knee: Off  GP     Rada Hay 03/22/2013, 9:48 AM Clydie Braun  [facility] [phone]

## 2013-03-22 NOTE — Evaluation (Signed)
Occupational Therapy Evaluation Patient Details Name: Robert [name] MRN: [id number] DOB: 1950-07-14 Today's Date: 03/22/2013 Time: 7829-5621 OT Time Calculation (min): 20 min  OT Assessment / Plan / Recommendation History of present illness LTKA   Clinical Impression   Pt is moving well. All education completed and will need 3in1. Has wife to assist at home. Defer tub transfer to HHPT.    OT Assessment  Patient does not need any further OT services    Follow Up Recommendations  No OT follow up;Supervision/Assistance - 24 hour    Barriers to Discharge      Equipment Recommendations  3 in 1 bedside comode    Recommendations for Other Services    Frequency       Precautions / Restrictions Precautions Precautions: Knee Required Braces or Orthoses: Knee Immobilizer - Left Knee Immobilizer - Left: Discontinue once straight leg raise with < 10 degree lag Restrictions Weight Bearing Restrictions: No   Pertinent Vitals/Pain 5/10; reposition, ice    ADL  Eating/Feeding: Simulated;Independent Where Assessed - Eating/Feeding: Chair Grooming: Simulated;Wash/dry hands;Set up Where Assessed - Grooming: Supported sitting Upper Body Bathing: Simulated;Chest;Right arm;Left arm;Abdomen;Set up Where Assessed - Upper Body Bathing: Supported sitting Lower Body Bathing: Simulated;Minimal assistance;Moderate assistance Where Assessed - Lower Body Bathing: Supported sit to stand Upper Body Dressing: Simulated;Set up Where Assessed - Upper Body Dressing: Unsupported sitting Lower Body Dressing: Simulated;Moderate assistance Where Assessed - Lower Body Dressing: Supported sit to stand Toilet Transfer: Engineer, technical sales: Raised toilet seat with arms (or 3-in-1 over toilet) Toileting - Clothing Manipulation and Hygiene: Simulated;Min guard Where Assessed - Engineer, mining and Hygiene: Sit to stand from 3-in-1 or toilet Equipment Used: Rolling  walker ADL Comments: Pt has some AE but states wife can also assist. He is familiar with AE from his back surgery years ago. Discussed tub transfer bench option but pt states he will sponge bathe initially and is agreeable to let HHPT assess tub transfer with him. Per PT, did not use KI during PT session. Pt did well without.    OT Diagnosis:    OT Problem List:   OT Treatment Interventions:     OT Goals(Current goals can be found in the care plan section) Acute Rehab OT Goals Patient Stated Goal: I want to get this over and hope my back will get better.  Visit Information  Last OT Received On: 03/22/13 Assistance Needed: +1 History of Present Illness: LTKA       Prior Functioning     Home Living Family/patient expects to be discharged to:: Private residence Living Arrangements: Spouse/significant other Available Help at Discharge: Family Type of Home: House Home Access: Stairs to enter Secretary/administrator of Steps: 4 Entrance Stairs-Rails: Right;Left Home Layout: One level Home Equipment: Environmental consultant - 2 wheels;Adaptive equipment (needs new RW) Adaptive Equipment: Reacher;Long-handled shoe horn;Long-handled sponge Prior Function Level of Independence: Independent Communication Communication: No difficulties         Vision/Perception     Cognition  Cognition Arousal/Alertness: Awake/alert Behavior During Therapy: WFL for tasks assessed/performed Overall Cognitive Status: Within Functional Limits for tasks assessed    Extremity/Trunk Assessment Upper Extremity Assessment Upper Extremity Assessment: Overall WFL for tasks assessed Lower Extremity Assessment Lower Extremity Assessment: LLE deficits/detail LLE Deficits / Details: able to perform SLR with difficulty due to thigh soreness., knee flexion 40 degrees.     Mobility  Transfers Transfers: Sit to Stand;Stand to Sit Sit to Stand: 4: Min guard;With upper extremity assist;From chair/3-in-1 Stand to  Sit: 4:  Min guard;With upper extremity assist;To chair/3-in-1 Details for Transfer Assistance: verbal cues for safety and use of UE's, placement of LLE        Balance     End of Session OT - End of Session Equipment Utilized During Treatment: Rolling walker Patient left: in chair;with call bell/phone within reach;with family/visitor present CPM Left Knee CPM Left Knee: Off  GO     Lennox Laity [phone] 03/22/2013, 10:01 AM

## 2013-03-23 ENCOUNTER — Ambulatory Visit: Payer: 59 | Admitting: Internal Medicine

## 2013-03-23 LAB — BASIC METABOLIC PANEL
BUN: 9 mg/dL (ref 6–23)
CO2: 29 mEq/L (ref 19–32)
Calcium: 8.9 mg/dL (ref 8.4–10.5)
Chloride: 101 mEq/L (ref 96–112)
Creatinine, Ser: 0.74 mg/dL (ref 0.50–1.35)
GFR calc Af Amer: >90 mL/min (ref >90)
GFR calc non Af Amer: >90 mL/min (ref >90)
Glucose, Bld: 142 mg/dL — ABNORMAL HIGH (ref 70–99)
Potassium: 3.8 mEq/L (ref 3.5–5.1)
Sodium: 137 mEq/L (ref 135–145)

## 2013-03-23 LAB — CBC
HCT: 35.7 % — ABNORMAL LOW (ref 39.0–52.0)
Hemoglobin: 12.6 g/dL — ABNORMAL LOW (ref 13.0–17.0)
MCH: 32 pg (ref 26.0–34.0)
MCHC: 35.3 g/dL (ref 30.0–36.0)
MCV: 90.6 fL (ref 78.0–100.0)
Platelets: 266 10*3/uL (ref 150–400)
RBC: 3.94 MIL/uL — ABNORMAL LOW (ref 4.22–5.81)
RDW: 12.7 % (ref 11.5–15.5)
WBC: 19 10*3/uL — ABNORMAL HIGH (ref 4.0–10.5)

## 2013-03-23 LAB — PROTIME-INR
INR: 1.28 (ref 0.00–1.49)
Prothrombin Time: 15.7 seconds — ABNORMAL HIGH (ref 11.6–15.2)

## 2013-03-23 MED ORDER — ENOXAPARIN SODIUM 30 MG/0.3ML ~~LOC~~ SOLN: 30.0000 mg | Freq: Two times a day (BID) | SUBCUTANEOUS | Status: DC

## 2013-03-23 MED ORDER — HYDROMORPHONE HCL 2 MG PO TABS: 2.0000 mg | ORAL_TABLET | ORAL | Status: DC | PRN

## 2013-03-23 MED ORDER — TRAMADOL HCL 50 MG PO TABS: 50.0000 mg | ORAL_TABLET | Freq: Four times a day (QID) | ORAL | Status: DC | PRN

## 2013-03-23 MED ORDER — WARFARIN SODIUM 7.5 MG PO TABS: 7.5000 mg | ORAL_TABLET | Freq: Every day | ORAL | Status: DC

## 2013-03-23 MED ORDER — METHOCARBAMOL 500 MG PO TABS: 500.0000 mg | ORAL_TABLET | Freq: Four times a day (QID) | ORAL | Status: DC | PRN

## 2013-03-23 NOTE — Progress Notes (Signed)
[facility]  [facility] is providing the following services: RW and Commode  If patient discharges after hours, please call [phone].   Renard Hamper 03/23/2013, 10:32 AM

## 2013-03-23 NOTE — Progress Notes (Addendum)
Subjective: 2 Days Post-Op Procedure(s) (LRB): LEFT TOTAL KNEE ARTHROPLASTY (Left) Patient reports pain as mild.   Patient seen in rounds with Dr. Lequita Halt.  Wife in room. Patient is well, and has had no acute complaints or problems Patient is ready to go home today   Objective: Vital signs in last 24 hours: Temp:  [97.5 F (36.4 C)-98.1 F (36.7 C)] 98.1 F (36.7 C) (10/01 0543) Pulse Rate:  [73-85] 73 (10/01 0543) Resp:  [16-18] 16 (10/01 0543) BP: (127-148)/(71-80) 148/80 mmHg (10/01 0543) SpO2:  [92 %-97 %] 97 % (10/01 0543)  Intake/Output from previous day:  Intake/Output Summary (Last 24 hours) at 03/23/13 1049 Last data filed at 03/23/13 0544  Gross per 24 hour  Intake    480 ml  Output   2200 ml  Net  -1720 ml    Intake/Output this shift:    Labs:  Recent Labs  03/22/13 0431 03/23/13 0440  HGB 12.9* 12.6*    Recent Labs  03/22/13 0431 03/23/13 0440  WBC 16.6* 19.0*  RBC 4.11* 3.94*  HCT 37.1* 35.7*  PLT 263 266    Recent Labs  03/22/13 0431 03/23/13 0440  NA 132* 137  K 4.2 3.8  CL 98 101  CO2 25 29  BUN 8 9  CREATININE 0.80 0.74  GLUCOSE 150* 142*  CALCIUM 8.5 8.9    Recent Labs  03/22/13 0431 03/23/13 0440  INR 1.16 1.28    EXAM: General - Patient is Alert, Appropriate and Oriented Extremity - Neurovascular intact Sensation intact distally Dorsiflexion/Plantar flexion intact Incision - clean, dry, no drainage, healing Motor Function - intact, moving foot and toes well on exam.   Assessment/Plan: 2 Days Post-Op Procedure(s) (LRB): LEFT TOTAL KNEE ARTHROPLASTY (Left) Procedure(s) (LRB): LEFT TOTAL KNEE ARTHROPLASTY (Left) Past Medical History  Diagnosis Date  . Warfarin anticoagulation   . LBP (low back pain)   . History of pulmonary embolism 2003    both lungs  . Edema     Right Leg w/ h/o DVT postphlebitic  . Hyperlipidemia   . Osteoarthritis   . Obesity   . Knee pain     left  . Peripheral vascular disease      "poor circulation in  RT leg"  . Anxiety   . Spondylolisthesis   . DVT (deep venous thrombosis) 2003    right femoral artery "from groin to knee"  . Asthma     "no attack since acupuncture in 1990's"  . Wheezing     when laying on left side  . 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 Cyst of left kidney     "benign"  . Headache(784.0)     MIGRAINES-not for a long time  . Complication of anesthesia     SLOW TO WAKE UP / DIFFICULTY RESPONDING 2003, 3 days before could use left arm, "wild/crazy sometimes"  . PONV (postoperative nausea and vomiting)     during surgery in 1990's  . H/O blood transfusion reaction 2004    FFP, extreme swelling, could not breath   Principal Problem:   OA (osteoarthritis) of knee Active Problems:   Postoperative anemia due to acute blood loss   Hyponatremia  Estimated body mass index is 40.46 kg/(m^2) as calculated from the following:   Height as of this encounter: 5\' 11"  (1.803 m).   Weight as of this encounter: 131.543 kg (290 lb). Up with therapy Discharge home with home health Diet -  Cardiac diet Follow up - in 2 weeks Activity - WBAT Disposition - Home Condition Upon Discharge - Good D/C Meds - See DC Summary DVT Prophylaxis - Lovenox and Coumadin INR today is 1.28.  Will continue Lovenox for three more days.  Will need HHRN to go out to follow INR's.  Robert [name] 03/23/2013, 10:49 AM

## 2013-03-23 NOTE — Discharge Summary (Signed)
Physician Discharge Summary   Patient ID: [name] MRN: [id number] DOB/AGE: 61-May-1952 61 y.o.  Admit date: 03/21/2013 Discharge date: 10/1/201410/06/2012  Primary Diagnosis:  Osteoarthritis Left knee(s)  Admission Diagnoses:  Past Medical History  Diagnosis Date  . Warfarin anticoagulation   . LBP (low back pain)   . History of pulmonary embolism 2003    both lungs  . Edema     Right Leg w/ h/o DVT postphlebitic  . Hyperlipidemia   . Osteoarthritis   . Obesity   . Knee pain     left  . Peripheral vascular disease     "poor circulation in  RT leg"  . Anxiety   . Spondylolisthesis   . DVT (deep venous thrombosis) 2003    right femoral artery "from groin to knee"  . Asthma     "no attack since acupuncture in 1990's"  . Wheezing     when laying on left side  . 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 Cyst of left kidney     "benign"  . Headache(784.0)     MIGRAINES-not for a long time  . Complication of anesthesia     SLOW TO WAKE UP / DIFFICULTY RESPONDING 2003, 3 days before could use left arm, "wild/crazy sometimes"  . PONV (postoperative nausea and vomiting)     during surgery in 1990's  . H/O blood transfusion reaction 2004    FFP, extreme swelling, could not breath   Discharge Diagnoses:   Principal Problem:   OA (osteoarthritis) of knee Active Problems:   Postoperative anemia due to acute blood loss   Hyponatremia  Estimated body mass index is 40.46 kg/(m^2) as calculated from the following:   Height as of this encounter: 5\' 11"  (1.803 m).   Weight as of this encounter: 131.543 kg (290 lb).  Procedure:  Procedure(s) (LRB): LEFT TOTAL KNEE ARTHROPLASTY (Left)   Consults: None  HPI: Robert [name] is a 62 y.o. year old male with end stage OA of his left knee with progressively worsening pain and dysfunction. He has constant pain, with activity and at rest and significant functional deficits with difficulties even with  ADLs. He has had extensive non-op management including analgesics, arthroscopy, injections of cortisone, and home exercise program, but remains in significant pain with significant dysfunction. Radiographs and arthroscopic photos show bone on bone arthritis medial compartment. He presents now for left Total Knee Arthroplasty.   Laboratory Data: Admission on 03/21/2013, Discharged on 03/23/2013  Component Date Value Range Status  . Prothrombin Time 03/21/2013 14.4  11.6 - 15.2 seconds Final  . INR 03/21/2013 1.14  0.00 - 1.49 Final  . WBC 03/22/2013 16.6* 4.0 - 10.5 K/uL Final  . RBC 03/22/2013 4.11* 4.22 - 5.81 MIL/uL Final  . Hemoglobin 03/22/2013 12.9* 13.0 - 17.0 g/dL Final  . HCT 46/96/2952 37.1* 39.0 - 52.0 % Final  . MCV 03/22/2013 90.3  78.0 - 100.0 fL Final  . MCH 03/22/2013 31.4  26.0 - 34.0 pg Final  . MCHC 03/22/2013 34.8  30.0 - 36.0 g/dL Final  . RDW 84/13/2440 12.6  11.5 - 15.5 % Final  . Platelets 03/22/2013 263  150 - 400 K/uL Final  . Sodium 03/22/2013 132* 135 - 145 mEq/L Final  . Potassium 03/22/2013 4.2  3.5 - 5.1 mEq/L Final  . Chloride 03/22/2013 98  96 - 112 mEq/L Final  . CO2 03/22/2013 25  19 - 32 mEq/L Final  .  Glucose, Bld 03/22/2013 150* 70 - 99 mg/dL Final  . BUN 16/03/9603 8  6 - 23 mg/dL Final  . Creatinine, Ser 03/22/2013 0.80  0.50 - 1.35 mg/dL Final  . Calcium 54/02/8118 8.5  8.4 - 10.5 mg/dL Final  . GFR calc non Af Amer 03/22/2013 >90  >90 mL/min Final  . GFR calc Af Amer 03/22/2013 >90  >90 mL/min Final   Comment: (NOTE)                          The eGFR has been calculated using the CKD EPI equation.                          This calculation has not been validated in all clinical situations.                          eGFR's persistently <90 mL/min signify possible Chronic Kidney                          Disease.  [name] Prothrombin Time 03/22/2013 14.6  11.6 - 15.2 seconds Final  . INR 03/22/2013 1.16  0.00 - 1.49 Final  . WBC 03/23/2013 19.0* 4.0 -  10.5 K/uL Final  . RBC 03/23/2013 3.94* 4.22 - 5.81 MIL/uL Final  . Hemoglobin 03/23/2013 12.6* 13.0 - 17.0 g/dL Final  . HCT 14/78/2956 35.7* 39.0 - 52.0 % Final  . MCV 03/23/2013 90.6  78.0 - 100.0 fL Final  . MCH 03/23/2013 32.0  26.0 - 34.0 pg Final  . MCHC 03/23/2013 35.3  30.0 - 36.0 g/dL Final  . RDW 21/30/8657 12.7  11.5 - 15.5 % Final  . Platelets 03/23/2013 266  150 - 400 K/uL Final  . Sodium 03/23/2013 137  135 - 145 mEq/L Final  . Potassium 03/23/2013 3.8  3.5 - 5.1 mEq/L Final  . Chloride 03/23/2013 101  96 - 112 mEq/L Final  . CO2 03/23/2013 29  19 - 32 mEq/L Final  . Glucose, Bld 03/23/2013 142* 70 - 99 mg/dL Final  . BUN 84/69/6295 9  6 - 23 mg/dL Final  . Creatinine, Ser 03/23/2013 0.74  0.50 - 1.35 mg/dL Final  . Calcium 28/41/3244 8.9  8.4 - 10.5 mg/dL Final  . GFR calc non Af Amer 03/23/2013 >90  >90 mL/min Final  . GFR calc Af Amer 03/23/2013 >90  >90 mL/min Final   Comment: (NOTE)                          The eGFR has been calculated using the CKD EPI equation.                          This calculation has not been validated in all clinical situations.                          eGFR's persistently <90 mL/min signify possible Chronic Kidney                          Disease.  [name] Prothrombin Time 03/23/2013 15.7* 11.6 - 15.2 seconds Final  . INR 03/23/2013 1.28  0.00 - 1.49 Final  Hospital Outpatient Visit on 03/14/2013  Component Date Value Range Status  .  MRSA, PCR 03/14/2013 NEGATIVE  NEGATIVE Final  . Staphylococcus aureus 03/14/2013 POSITIVE* NEGATIVE Final   Comment:                                 The Xpert SA Assay (FDA                          approved for NASAL specimens                          in patients over 55 years of age),                          is one component of                          a comprehensive surveillance                          program.  Test performance has                          been validated by [employer] for patients greater                          than or equal to 32 year old.                          It is not intended                          to diagnose infection nor to                          guide or monitor treatment.  [name] aPTT 03/14/2013 39* 24 - 37 seconds Final   Comment:                                 IF BASELINE aPTT IS ELEVATED,                          SUGGEST PATIENT RISK ASSESSMENT                          BE USED TO DETERMINE APPROPRIATE                          ANTICOAGULANT THERAPY.  . WBC 03/14/2013 8.6  4.0 - 10.5 K/uL Final  . RBC 03/14/2013 4.82  4.22 - 5.81 MIL/uL Final  . Hemoglobin 03/14/2013 15.2  13.0 - 17.0 g/dL Final  . HCT 16/03/9603 44.2  39.0 - 52.0 % Final  . MCV 03/14/2013 91.7  78.0 - 100.0 fL Final  . MCH 03/14/2013 31.5  26.0 - 34.0 pg Final  . MCHC 03/14/2013 34.4  30.0 - 36.0 g/dL Final  . RDW 54/02/8118 12.9  11.5 - 15.5 % Final  . Platelets 03/14/2013 291  150 - 400 K/uL Final  . Sodium 03/14/2013 135  135 - 145 mEq/L Final  . Potassium 03/14/2013 4.4  3.5 - 5.1 mEq/L Final  . Chloride 03/14/2013 97  96 - 112 mEq/L Final  . CO2 03/14/2013 29  19 - 32 mEq/L Final  . Glucose, Bld 03/14/2013 98  70 - 99 mg/dL Final  . BUN 96/09/5407 15  6 - 23 mg/dL Final  . Creatinine, Ser 03/14/2013 0.95  0.50 - 1.35 mg/dL Final  . Calcium 81/19/1478 9.4  8.4 - 10.5 mg/dL Final  . Total Protein 03/14/2013 7.9  6.0 - 8.3 g/dL Final  . Albumin 29/56/2130 3.7  3.5 - 5.2 g/dL Final  . AST 86/57/8469 24  0 - 37 U/L Final  . ALT 03/14/2013 25  0 - 53 U/L Final  . Alkaline Phosphatase 03/14/2013 69  39 - 117 U/L Final  . Total Bilirubin 03/14/2013 0.4  0.3 - 1.2 mg/dL Final  . GFR calc non Af Amer 03/14/2013 88* >90 mL/min Final  . GFR calc Af Amer 03/14/2013 >90  >90 mL/min Final   Comment: (NOTE)                          The eGFR has been calculated using the CKD EPI equation.                          This calculation has not been validated in all  clinical situations.                          eGFR's persistently <90 mL/min signify possible Chronic Kidney                          Disease.  [name] Prothrombin Time 03/14/2013 25.6* 11.6 - 15.2 seconds Final  . INR 03/14/2013 2.43* 0.00 - 1.49 Final  . ABO/RH(D) 03/14/2013 O POS   Final  . Antibody Screen 03/14/2013 NEG   Final  . Sample Expiration 03/14/2013 03/24/2013   Final  . Color, Urine 03/14/2013 YELLOW  YELLOW Final  . APPearance 03/14/2013 CLEAR  CLEAR Final  . Specific Gravity, Urine 03/14/2013 1.018  1.005 - 1.030 Final  . pH 03/14/2013 5.0  5.0 - 8.0 Final  . Glucose, UA 03/14/2013 NEGATIVE  NEGATIVE mg/dL Final  . Hgb urine dipstick 03/14/2013 TRACE* NEGATIVE Final  . Bilirubin Urine 03/14/2013 NEGATIVE  NEGATIVE Final  . Ketones, ur 03/14/2013 NEGATIVE  NEGATIVE mg/dL Final  . Protein, ur 62/95/2841 NEGATIVE  NEGATIVE mg/dL Final  . Urobilinogen, UA 03/14/2013 0.2  0.0 - 1.0 mg/dL Final  . Nitrite 32/44/0102 NEGATIVE  NEGATIVE Final  . Leukocytes, UA 03/14/2013 NEGATIVE  NEGATIVE Final  . RBC / HPF 03/14/2013 0-2  <3 RBC/hpf Final  . Urine-Other 03/14/2013 MUCOUS PRESENT   Final  . ABO/RH(D) 03/14/2013 O POS   Final  Appointment on 03/09/2013  Component Date Value Range Status  . Total Bilirubin 03/09/2013 0.8  0.3 - 1.2 mg/dL Final  . Bilirubin, Direct 03/09/2013 0.1  0.0 - 0.3 mg/dL Final  . Alkaline Phosphatase 03/09/2013 61  39 - 117 U/L Final  . AST 03/09/2013 24  0 - 37 U/L Final  . ALT 03/09/2013 24  0 - 53 U/L Final  . Total Protein 03/09/2013 7.7  6.0 - 8.3 g/dL Final  . Albumin 47/82/9562 3.7  3.5 - 5.2 g/dL Final  . Cholesterol 13/01/6577 218* 0 - 200 mg/dL Final   ATP III Classification       Desirable:  < 200 mg/dL               Borderline High:  200 - 239 mg/dL          High:  > = 469 mg/dL  . Triglycerides 03/09/2013 145.0  0.0 - 149.0 mg/dL Final   Normal:  <629 mg/dLBorderline High:  150 - 199 mg/dL  . HDL 03/09/2013 26.20* >39.00 mg/dL Final  . VLDL  52/84/1324 29.0  0.0 - 40.0 mg/dL Final  . Total CHOL/HDL Ratio 03/09/2013 8   Final                  Men          Women1/2 Average Risk     3.4          3.3Average Risk          5.0          4.42X Average Risk          9.6          7.13X Average Risk          15.0          11.0                      . Sodium 03/09/2013 136  135 - 145 mEq/L Final  . Potassium 03/09/2013 4.2  3.5 - 5.1 mEq/L Final  . Chloride 03/09/2013 102  96 - 112 mEq/L Final  . CO2 03/09/2013 29  19 - 32 mEq/L Final  . Glucose, Bld 03/09/2013 101* 70 - 99 mg/dL Final  . BUN 40/03/2724 11  6 - 23 mg/dL Final  . Creatinine, Ser 03/09/2013 0.9  0.4 - 1.5 mg/dL Final  . Calcium 36/64/4034 9.0  8.4 - 10.5 mg/dL Final  . GFR 74/25/9563 92.22  >60.00 mL/min Final  . Direct LDL 03/09/2013 168.1   Final   Optimal:  <100 mg/dLNear or Above Optimal:  100-129 mg/dLBorderline High:  130-159 mg/dLHigh:  160-189 mg/dLVery High:  >190 mg/dL  Anti-coag visit on 87/56/4332  Component Date Value Range Status  . INR 02/01/2013 2.8   Final     X-Rays:No results found.  EKG: Orders placed during the hospital encounter of 10/22/12  . EKG     Hospital Course: STEN DEMATTEO is a 62 y.o. who was admitted to [facility]. They were brought to the operating room on 03/21/2013 and underwent Procedure(s): LEFT TOTAL KNEE ARTHROPLASTY.  Patient tolerated the procedure well and was later transferred to the recovery room and then to the orthopaedic floor for postoperative care.  They were given PO and IV analgesics for pain control following their surgery.  They were given 24 hours of postoperative antibiotics of  Anti-infectives   Start     Dose/Rate Route Frequency Ordered Stop   03/22/13 0100  vancomycin (VANCOCIN) IVPB 1000 mg/200 mL premix     1,000 mg 200 mL/hr over 60 Minutes Intravenous Every 12 hours 03/21/13 1624 03/22/13 0212   03/21/13 0600  vancomycin (VANCOCIN) 1,500 mg in sodium chloride 0.9 % 500 mL IVPB     1,500  mg 250 mL/hr over 120 Minutes Intravenous On call to O.R. 03/20/13 1559 03/21/13 1255  and started on DVT prophylaxis in the form of Lovenox and Coumadin.   PT and OT were ordered for total joint protocol.  Discharge planning consulted to help with postop disposition and equipment needs.  Patient had a decent night on the evening of surgery.  They started to get up OOB with therapy on day one. Hemovac drain was pulled without difficulty.  Continued to work with therapy into day two.  Dressing was changed on day two and the incision was healing well.  Patient was seen in rounds and was ready to go home later that same day.   Discharge Medications: Prior to Admission medications   Medication Sig Start Date End Date Taking? Authorizing Provider  cetirizine (ZYRTEC) 10 MG tablet Take 10 mg by mouth daily.    Yes Historical Provider, MD  clotrimazole-betamethasone (LOTRISONE) cream Apply 1 application topically 2 (two) times daily as needed. Apply to red spots on face   Yes Historical Provider, MD  furosemide (LASIX) 20 MG tablet Take 20 mg by mouth every morning.    Yes Historical Provider, MD  nortriptyline (PAMELOR) 10 MG capsule Take 10 mg by mouth every evening.   Yes Historical Provider, MD  albuterol (PROVENTIL HFA;VENTOLIN HFA) 108 (90 BASE) MCG/ACT inhaler Inhale 2 puffs into the lungs every 6 (six) hours as needed for wheezing.    Historical Provider, MD  diphenoxylate-atropine (LOMOTIL) 2.5-0.025 MG per tablet Take 1-2 tablets by mouth 4 (four) times daily as needed for diarrhea or loose stools.     Historical Provider, MD  enoxaparin (LOVENOX) 30 MG/0.3ML injection Inject 0.3 mLs (30 mg total) into the skin every 12 (twelve) hours. Continue Lovenox injections until the INR is therapeutic at or greater than 2.0.  When INR reaches the therapeutic level of equal to or greater than 2.0, the patient may discontinue the Lovenox injections. 03/23/13   Mykal Batiz, PA-C  HYDROmorphone  (DILAUDID) 2 MG tablet Take 1-2 tablets (2-4 mg total) by mouth every 3 (three) hours as needed for pain. 03/23/13   Daquann Merriott, PA-C  methocarbamol (ROBAXIN) 500 MG tablet Take 1 tablet (500 mg total) by mouth every 6 (six) hours as needed. 03/23/13   Rivan Siordia Julien Girt, PA-C  warfarin (COUMADIN) 7.5 MG tablet Take 1 tablet (7.5 mg total) by mouth daily. Take Coumadin for three weeks for postoperative protocol and then the patient may resume their previous Coumadin home regimen.  The dose may need to be adjusted based upon the INR.  Please follow the INR and titrate Coumadin dose for a therapeutic range between 2.0 and 3.0 INR.  After completing the three weeks of Coumadin, the patient may resume their previous Coumadin home regimen. 03/23/13   Terea Neubauer Julien Girt, PA-C   Discharge home with home health  Diet - Cardiac diet  Follow up - in 2 weeks  Activity - WBAT  Disposition - Home  Condition Upon Discharge - Good  D/C Meds - See DC Summary  DVT Prophylaxis - Lovenox and Coumadin  INR today is 1.28. Will continue Lovenox for three more days. Will need HHRN to go out to follow INR's.       Discharge Orders   Future Appointments Provider Department Dept Phone   05/13/2013 7:45 AM Tresa Garter, MD [facility] [phone]   Future Orders Complete By Expires   Call MD / Call 911  As directed    Comments:     If you experience chest pain or shortness of breath, CALL  911 and be transported to the hospital emergency room.  If you develope a fever above 101 F, pus (white drainage) or increased drainage or redness at the wound, or calf pain, call your surgeon's office.   Change dressing  As directed    Comments:     Change dressing daily with sterile 4 x 4 inch gauze dressing and apply TED hose. Do not submerge the incision under water.   Constipation Prevention  As directed    Comments:     Drink plenty of fluids.  Prune juice may be helpful.  You may  use a stool softener, such as Colace (over the counter) 100 mg twice a day.  Use MiraLax (over the counter) for constipation as needed.   Diet - low sodium heart healthy  As directed    Discharge instructions  As directed    Comments:     Pick up stool softner and laxative for home. Do not submerge incision under water. May shower. Continue to use ice for pain and swelling from surgery.  Take Coumadin for three weeks for postoperative protocol and then the patient may resume their previous Coumadin home regimen.  The dose may need to be adjusted based upon the INR.  Please follow the INR and titrate Coumadin dose for a therapeutic range between 2.0 and 3.0 INR.  After completing the three weeks of Coumadin, the patient may resume their previous Coumadin home regimen.  Continue Lovenox injections until the INR is therapeutic at or greater than 2.0.  When INR reaches the therapeutic level of equal to or greater than 2.0, the patient may discontinue the Lovenox injections.   Do not put a pillow under the knee. Place it under the heel.  As directed    Do not sit on low chairs, stoools or toilet seats, as it may be difficult to get up from low surfaces  As directed    Driving restrictions  As directed    Comments:     No driving until released by the physician.   Increase activity slowly as tolerated  As directed    Lifting restrictions  As directed    Comments:     No lifting until released by the physician.   Patient may shower  As directed    Comments:     You may shower without a dressing once there is no drainage.  Do not wash over the wound.  If drainage remains, do not shower until drainage stops.   TED hose  As directed    Comments:     Use stockings (TED hose) for 3 weeks on both leg(s).  You may remove them at night for sleeping.   Weight bearing as tolerated  As directed        Medication List    STOP taking these medications       enoxaparin 150 MG/ML injection  Commonly  known as:  LOVENOX  Replaced by:  enoxaparin 30 MG/0.3ML injection     fish oil-omega-3 fatty acids 1000 MG capsule     Glucosamine 500 MG Caps     oxyCODONE 5 MG immediate release tablet  Commonly known as:  Oxy IR/ROXICODONE     vitamin B-12 1000 MCG tablet  Commonly known as:  CYANOCOBALAMIN      TAKE these medications       albuterol 108 (90 BASE) MCG/ACT inhaler  Commonly known as:  PROVENTIL HFA;VENTOLIN HFA  Inhale 2 puffs into the lungs every 6 (six)  hours as needed for wheezing.     cetirizine 10 MG tablet  Commonly known as:  ZYRTEC  Take 10 mg by mouth daily.     clotrimazole-betamethasone cream  Commonly known as:  LOTRISONE  Apply 1 application topically 2 (two) times daily as needed. Apply to red spots on face     diphenoxylate-atropine 2.5-0.025 MG per tablet  Commonly known as:  LOMOTIL  Take 1-2 tablets by mouth 4 (four) times daily as needed for diarrhea or loose stools.     enoxaparin 30 MG/0.3ML injection  Commonly known as:  LOVENOX  Inject 0.3 mLs (30 mg total) into the skin every 12 (twelve) hours. Continue Lovenox injections until the INR is therapeutic at or greater than 2.0.  When INR reaches the therapeutic level of equal to or greater than 2.0, the patient may discontinue the Lovenox injections.     furosemide 20 MG tablet  Commonly known as:  LASIX  Take 20 mg by mouth every morning.     HYDROmorphone 2 MG tablet  Commonly known as:  DILAUDID  Take 1-2 tablets (2-4 mg total) by mouth every 3 (three) hours as needed for pain.     methocarbamol 500 MG tablet  Commonly known as:  ROBAXIN  Take 1 tablet (500 mg total) by mouth every 6 (six) hours as needed.     nortriptyline 10 MG capsule  Commonly known as:  PAMELOR  Take 10 mg by mouth every evening.     traMADol 50 MG tablet  Commonly known as:  ULTRAM  Take 1 tablet (50 mg total) by mouth every 6 (six) hours as needed for pain (mild pain).     warfarin 7.5 MG tablet  Commonly known  as:  COUMADIN  Take 1 tablet (7.5 mg total) by mouth daily. Take Coumadin for three weeks for postoperative protocol and then the patient may resume their previous Coumadin home regimen.  The dose may need to be adjusted based upon the INR.  Please follow the INR and titrate Coumadin dose for a therapeutic range between 2.0 and 3.0 INR.  After completing the three weeks of Coumadin, the patient may resume their previous Coumadin home regimen.       Follow-up Information   Follow up with Loanne Drilling, MD. Schedule an appointment as soon as possible for a visit on 04/05/2013.   Specialty:  Orthopedic Surgery   Contact information:   [address] [place] [address] [phone]       Signed: Patrica Duel 03/24/2013, 10:20 AM

## 2013-03-23 NOTE — Progress Notes (Signed)
Physical Therapy Treatment Patient Details Name: Robert [name] MRN: [id number] DOB: 1951-02-25 Today's Date: 03/23/2013 Time: 1478-2956 PT Time Calculation (min): 20 min  PT Assessment / Plan / Recommendation  History of Present Illness LTKA   PT Comments   Pt is doing well. For DC.  Follow Up Recommendations  Home health PT     Does the patient have the potential to tolerate intense rehabilitation     Barriers to Discharge        Equipment Recommendations       Recommendations for Other Services    Frequency     Progress towards PT Goals Progress towards PT goals: Progressing toward goals  Plan Current plan remains appropriate    Precautions / Restrictions Precautions Precautions: Knee   Pertinent Vitals/Pain Sore.    Mobility  Bed Mobility Supine to Sit: 6: Modified independent (Device/Increase time) Sit to Supine: 6: Modified independent (Device/Increase time) Details for Bed Mobility Assistance: pt is using leg lifter to self assist. Transfers Sit to Stand: 5: Supervision;From bed Stand to Sit: To bed;5: Supervision Details for Transfer Assistance: verbal cues for safety and use of UE's, placement of LLE Ambulation/Gait Ambulation/Gait Assistance: 5: Supervision Ambulation Distance (Feet): 300 Feet Assistive device: Rolling walker Ambulation/Gait Assistance Details: improved sequence and heel srike. Gait Pattern: Step-through pattern Stairs: Yes Stairs Assistance: 4: Min guard Stair Management Technique: One rail Right;With cane Number of Stairs: 2    Exercises Total Joint Exercises Ankle Circles/Pumps: AROM;Both;10 reps;Supine Quad Sets: AROM;Left;10 reps Short Arc Quad: Left;10 reps;AROM Heel Slides: Left;10 reps;AROM Straight Leg Raises: Left;10 reps;AROM Goniometric ROM: 10-50 L knee   PT Diagnosis:    PT Problem List:   PT Treatment Interventions:     PT Goals (current goals can now be found in the care plan section)    Visit  Information  Last PT Received On: 03/23/13 Assistance Needed: +1 History of Present Illness: LTKA    Subjective Data      Cognition  Cognition Arousal/Alertness: Awake/alert    Balance     End of Session PT - End of Session Activity Tolerance: Patient tolerated treatment well Patient left: in bed;with family/visitor present;with call bell/phone within reach   GP     Rada Hay 03/23/2013, 3:12 PM

## 2013-03-23 NOTE — Progress Notes (Signed)
ANTICOAGULATION CONSULT NOTE - Initial Consult  Pharmacy Consult for Warfarin Indication: VTE prophylaxis, h/o PE  Allergies  Allergen Reactions  . Exalgo [Hydromorphone Hcl] Nausea Only    Sick and nauseated  . Fentanyl Other (See Comments)    REACTION: too sleepy  . Hydrocodone-Acetaminophen Nausea Only    REACTION: nausea  . Penicillins Hives  . Sulfonamide Derivatives Hives and Swelling  . Tylenol [Acetaminophen]     Liver issues    Patient Measurements: Height: 5\' 11"  (180.3 cm) Weight: 290 lb (131.543 kg) IBW/kg (Calculated) : 75.3  Vital Signs: Temp: 98.1 F (36.7 C) (10/01 0543) Temp src: Oral (10/01 0543) BP: 148/80 mmHg (10/01 0543) Pulse Rate: 73 (10/01 0543)  Labs:  Recent Labs  03/21/13 1035 03/22/13 0431 03/23/13 0440  HGB  --  12.9* 12.6*  HCT  --  37.1* 35.7*  PLT  --  263 266  LABPROT 14.4 14.6 15.7*  INR 1.14 1.16 1.28  CREATININE  --  0.80 0.74    Estimated Creatinine Clearance: 134.1 ml/min (by C-G formula based on Cr of 0.74).   Medical History: Past Medical History  Diagnosis Date  . Warfarin anticoagulation   . LBP (low back pain)   . History of pulmonary embolism 2003    both lungs  . Edema     Right Leg w/ h/o DVT postphlebitic  . Hyperlipidemia   . Osteoarthritis   . Obesity   . Knee pain     left  . Peripheral vascular disease     "poor circulation in  RT leg"  . Anxiety   . Spondylolisthesis   . DVT (deep venous thrombosis) 2003    right femoral artery "from groin to knee"  . Asthma     "no attack since acupuncture in 1990's"  . Wheezing     when laying on left side  . 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 Cyst of left kidney     "benign"  . Headache(784.0)     MIGRAINES-not for a long time  . Complication of anesthesia     SLOW TO WAKE UP / DIFFICULTY RESPONDING 2003, 3 days before could use left arm, "wild/crazy sometimes"  . PONV (postoperative nausea and vomiting)     during surgery  in 1990's  . H/O blood transfusion reaction 2004    FFP, extreme swelling, could not breath     Assessment: 62 yoM on chronic warfarin therapy PTA for history of VTE.  Warfarin has been on hold for left TKA.  Patient is now s/p left TKA 9/29 and warfarin to be resumed tonight.  Warfarin dose PTA = 6 mg daily except takes 3 mg on Fridays  INR 1.28 this AM (INR subtherapeutic but trending upward), warfarin resumed 9/29 PM  CBC: hgb = 14.5-->12.6 (suspect from ABLA)  Lovenox 30 mg q12h ordered to begin 9/30 AM and to continue until INR >/= 1.8  Goal of Therapy:  INR 2-3 Monitor platelets by anticoagulation protocol: Yes   Plan:  1.  Suggest using home dosing of 6mg  daily with 3mg  on Fri at discharge (using higher dose of 7.5mg  in hospital to get INR moving upward) 2.  Daily INR.  [name], PharmD, BCPS.   Pager: [phone]  03/23/2013,11:47 AM

## 2013-03-24 NOTE — Progress Notes (Signed)
Discharge summary sent to payer through [facility]  

## 2013-03-24 NOTE — Care Management Note (Signed)
    Page 1 of 2   03/24/2013     11:00:15 AM   CARE MANAGEMENT NOTE 03/24/2013  Patient:  Robert [name], Robert [name]   Account Number:  [id number]  Date Initiated:  03/23/2013  Documentation initiated by:  Colleen Can  Subjective/Objective Assessment:   dx left knee osteoarthritis; total knee replacemnt     Action/Plan:   CM spoke with patient and spouse. Plans are for patient to return to his home where spouse will be caregiver. Pt will need RW and 3n1. Genevieve Norlander will provide hh services.   Anticipated DC Date:  03/23/2013   Anticipated DC Plan:  HOME W HOME HEALTH SERVICES      DC Planning Services  CM consult      PAC Choice  DURABLE MEDICAL EQUIPMENT  HOME HEALTH   Choice offered to / List presented to:  C-1 Patient   DME arranged  3-N-1  Levan Hurst      DME agency  [facility]     [facility] arranged  HH-1 RN  HH-2 PT      [facility] agency  [facility]   Status of service:  Completed, signed off Medicare Important Message given?   (If response is "NO", the following Medicare IM given date fields will be blank) Date Medicare IM given:   Date Additional Medicare IM given:    Discharge Disposition:  HOME W HOME HEALTH SERVICES  Per UR Regulation:    If discussed at Long Length of Stay Meetings, dates discussed:    Comments:  03/24/2013  Colleen Can BSN RN CCM [phone] Genevieve Norlander will provide [facility] services with start date of 03/24/2013.

## 2013-03-31 ENCOUNTER — Encounter (HOSPITAL_COMMUNITY): Payer: Self-pay | Admitting: Emergency Medicine

## 2013-03-31 ENCOUNTER — Inpatient Hospital Stay (HOSPITAL_COMMUNITY)
Admission: EM | Admit: 2013-03-31 | Discharge: 2013-04-07 | DRG: 908 | Disposition: A | Discharge status: Home Health | Payer: 59 | Attending: Orthopedic Surgery | Admitting: Orthopedic Surgery

## 2013-03-31 ENCOUNTER — Emergency Department (HOSPITAL_COMMUNITY): Payer: 59

## 2013-03-31 DIAGNOSIS — Z6841 Body Mass Index (BMI) 40.0 and over, adult: Secondary | ICD-10-CM

## 2013-03-31 DIAGNOSIS — F411 Generalized anxiety disorder: Secondary | ICD-10-CM | POA: Diagnosis present

## 2013-03-31 DIAGNOSIS — I471 Supraventricular tachycardia, unspecified: Secondary | ICD-10-CM | POA: Diagnosis not present

## 2013-03-31 DIAGNOSIS — I82509 Chronic embolism and thrombosis of unspecified deep veins of unspecified lower extremity: Secondary | ICD-10-CM | POA: Diagnosis present

## 2013-03-31 DIAGNOSIS — IMO0002 Reserved for concepts with insufficient information to code with codable children: Principal | ICD-10-CM | POA: Diagnosis present

## 2013-03-31 DIAGNOSIS — Y831 Surgical operation with implant of artificial internal device as the cause of abnormal reaction of the patient, or of later complication, without mention of misadventure at the time of the procedure: Secondary | ICD-10-CM | POA: Diagnosis present

## 2013-03-31 DIAGNOSIS — I519 Heart disease, unspecified: Secondary | ICD-10-CM | POA: Diagnosis not present

## 2013-03-31 DIAGNOSIS — M1712 Unilateral primary osteoarthritis, left knee: Secondary | ICD-10-CM

## 2013-03-31 DIAGNOSIS — I498 Other specified cardiac arrhythmias: Secondary | ICD-10-CM | POA: Diagnosis not present

## 2013-03-31 DIAGNOSIS — Z96659 Presence of unspecified artificial knee joint: Secondary | ICD-10-CM

## 2013-03-31 DIAGNOSIS — Z79899 Other long term (current) drug therapy: Secondary | ICD-10-CM

## 2013-03-31 DIAGNOSIS — I739 Peripheral vascular disease, unspecified: Secondary | ICD-10-CM | POA: Diagnosis present

## 2013-03-31 DIAGNOSIS — R609 Edema, unspecified: Secondary | ICD-10-CM

## 2013-03-31 DIAGNOSIS — J45909 Unspecified asthma, uncomplicated: Secondary | ICD-10-CM | POA: Diagnosis present

## 2013-03-31 DIAGNOSIS — I2782 Chronic pulmonary embolism: Secondary | ICD-10-CM | POA: Diagnosis present

## 2013-03-31 DIAGNOSIS — M1710 Unilateral primary osteoarthritis, unspecified knee: Secondary | ICD-10-CM | POA: Diagnosis present

## 2013-03-31 DIAGNOSIS — E785 Hyperlipidemia, unspecified: Secondary | ICD-10-CM | POA: Diagnosis present

## 2013-03-31 DIAGNOSIS — Y838 Other surgical procedures as the cause of abnormal reaction of the patient, or of later complication, without mention of misadventure at the time of the procedure: Secondary | ICD-10-CM | POA: Diagnosis not present

## 2013-03-31 DIAGNOSIS — E669 Obesity, unspecified: Secondary | ICD-10-CM | POA: Diagnosis present

## 2013-03-31 DIAGNOSIS — M25469 Effusion, unspecified knee: Secondary | ICD-10-CM | POA: Diagnosis present

## 2013-03-31 DIAGNOSIS — Z7901 Long term (current) use of anticoagulants: Secondary | ICD-10-CM

## 2013-03-31 DIAGNOSIS — R509 Fever, unspecified: Secondary | ICD-10-CM

## 2013-03-31 DIAGNOSIS — Z8249 Family history of ischemic heart disease and other diseases of the circulatory system: Secondary | ICD-10-CM

## 2013-03-31 LAB — CBC WITH DIFFERENTIAL/PLATELET
Basophils Absolute: 0.1 10*3/uL (ref 0.0–0.1)
Basophils Relative: 0 % (ref 0–1)
Eosinophils Absolute: 0.4 10*3/uL (ref 0.0–0.7)
Eosinophils Relative: 3 % (ref 0–5)
HCT: 36.7 % — ABNORMAL LOW (ref 39.0–52.0)
Hemoglobin: 13.1 g/dL (ref 13.0–17.0)
Lymphocytes Relative: 8 % — ABNORMAL LOW (ref 12–46)
Lymphs Abs: 1.2 10*3/uL (ref 0.7–4.0)
MCH: 31.7 pg (ref 26.0–34.0)
MCHC: 35.7 g/dL (ref 30.0–36.0)
MCV: 88.9 fL (ref 78.0–100.0)
Monocytes Absolute: 1.4 10*3/uL — ABNORMAL HIGH (ref 0.1–1.0)
Monocytes Relative: 9 % (ref 3–12)
Neutro Abs: 12.2 10*3/uL — ABNORMAL HIGH (ref 1.7–7.7)
Neutrophils Relative %: 80 % — ABNORMAL HIGH (ref 43–77)
Platelets: 459 10*3/uL — ABNORMAL HIGH (ref 150–400)
RBC: 4.13 MIL/uL — ABNORMAL LOW (ref 4.22–5.81)
RDW: 12.3 % (ref 11.5–15.5)
WBC: 15.2 10*3/uL — ABNORMAL HIGH (ref 4.0–10.5)

## 2013-03-31 LAB — BASIC METABOLIC PANEL
BUN: 13 mg/dL (ref 6–23)
CO2: 25 mEq/L (ref 19–32)
Calcium: 9 mg/dL (ref 8.4–10.5)
Chloride: 91 mEq/L — ABNORMAL LOW (ref 96–112)
Creatinine, Ser: 0.87 mg/dL (ref 0.50–1.35)
GFR calc Af Amer: >90 mL/min (ref >90)
GFR calc non Af Amer: >90 mL/min (ref >90)
Glucose, Bld: 124 mg/dL — ABNORMAL HIGH (ref 70–99)
Potassium: 4.2 mEq/L (ref 3.5–5.1)
Sodium: 126 mEq/L — ABNORMAL LOW (ref 135–145)

## 2013-03-31 LAB — SEDIMENTATION RATE: Sed Rate: 73 mm/hr — ABNORMAL HIGH (ref 0–16)

## 2013-03-31 LAB — URINALYSIS, ROUTINE W REFLEX MICROSCOPIC
Bilirubin Urine: NEGATIVE
Glucose, UA: NEGATIVE mg/dL
Hgb urine dipstick: NEGATIVE
Ketones, ur: NEGATIVE mg/dL
Leukocytes, UA: NEGATIVE
Nitrite: NEGATIVE
Protein, ur: NEGATIVE mg/dL
Specific Gravity, Urine: 1.023 (ref 1.005–1.030)
Urobilinogen, UA: 1 mg/dL (ref 0.0–1.0)
pH: 7.5 (ref 5.0–8.0)

## 2013-03-31 LAB — PROTIME-INR
INR: 2.24 — ABNORMAL HIGH (ref 0.00–1.49)
Prothrombin Time: 24.1 seconds — ABNORMAL HIGH (ref 11.6–15.2)

## 2013-03-31 MED ORDER — ALBUTEROL SULFATE HFA 108 (90 BASE) MCG/ACT IN AERS: 2.0000 | INHALATION_SPRAY | Freq: Four times a day (QID) | RESPIRATORY_TRACT | Status: DC | PRN

## 2013-03-31 MED ORDER — HYDROMORPHONE HCL PF 1 MG/ML IJ SOLN: 1.0000 mg | Freq: Once | INTRAMUSCULAR | Status: DC

## 2013-03-31 MED ORDER — METHOCARBAMOL 500 MG PO TABS
500.0000 mg | ORAL_TABLET | Freq: Four times a day (QID) | ORAL | Status: DC | PRN
  Administered 2013-04-01: 500 mg via ORAL
  Filled 2013-03-31: qty 1

## 2013-03-31 MED ORDER — NORTRIPTYLINE HCL 10 MG PO CAPS
10.0000 mg | ORAL_CAPSULE | Freq: Every evening | ORAL | Status: DC
  Administered 2013-04-01 – 2013-04-06 (×6): 10 mg via ORAL
  Filled 2013-03-31 (×8): qty 1

## 2013-03-31 MED ORDER — HYDROMORPHONE HCL 2 MG PO TABS
2.0000 mg | ORAL_TABLET | ORAL | Status: DC | PRN
  Administered 2013-03-31 – 2013-04-02 (×9): 4 mg via ORAL
  Administered 2013-04-02 (×2): 2 mg via ORAL
  Administered 2013-04-03 (×5): 4 mg via ORAL
  Administered 2013-04-04: 2 mg via ORAL
  Administered 2013-04-04 (×2): 4 mg via ORAL
  Administered 2013-04-05: 2 mg via ORAL
  Administered 2013-04-05: 4 mg via ORAL
  Administered 2013-04-06 – 2013-04-07 (×4): 2 mg via ORAL
  Filled 2013-03-31 (×4): qty 2
  Filled 2013-03-31: qty 1
  Filled 2013-03-31: qty 2
  Filled 2013-03-31: qty 1
  Filled 2013-03-31 (×2): qty 2
  Filled 2013-03-31: qty 1
  Filled 2013-03-31 (×6): qty 2
  Filled 2013-03-31: qty 1
  Filled 2013-03-31 (×2): qty 2
  Filled 2013-03-31 (×2): qty 1
  Filled 2013-03-31 (×4): qty 2

## 2013-03-31 MED ORDER — CLOTRIMAZOLE 1 % EX CREA
TOPICAL_CREAM | Freq: Two times a day (BID) | CUTANEOUS | Status: DC
  Administered 2013-04-03: 21:00:00 via TOPICAL
  Administered 2013-04-04: 1 via TOPICAL
  Administered 2013-04-05 – 2013-04-06 (×4): via TOPICAL
  Filled 2013-03-31: qty 15

## 2013-03-31 MED ORDER — SODIUM CHLORIDE 0.9 % IV BOLUS (SEPSIS)
1000.0000 mL | Freq: Once | INTRAVENOUS | Status: AC
  Administered 2013-03-31: 1000 mL via INTRAVENOUS

## 2013-03-31 MED ORDER — SODIUM CHLORIDE 0.9 % IV SOLN
INTRAVENOUS | Status: DC
  Administered 2013-04-01: via INTRAVENOUS
  Administered 2013-04-04: 125 mL/h via INTRAVENOUS
  Administered 2013-04-04: 03:00:00 via INTRAVENOUS
  Administered 2013-04-04 (×2): 1000 mL via INTRAVENOUS

## 2013-03-31 MED ORDER — VANCOMYCIN HCL 10 G IV SOLR
2500.0000 mg | Freq: Once | INTRAVENOUS | Status: AC
  Administered 2013-04-01: 2500 mg via INTRAVENOUS
  Filled 2013-03-31: qty 2500

## 2013-03-31 MED ORDER — MORPHINE SULFATE 4 MG/ML IJ SOLN
4.0000 mg | Freq: Once | INTRAMUSCULAR | Status: AC
  Administered 2013-03-31: 4 mg via INTRAMUSCULAR
  Filled 2013-03-31: qty 1

## 2013-03-31 MED ORDER — ACETAMINOPHEN 500 MG PO TABS
1000.0000 mg | ORAL_TABLET | Freq: Once | ORAL | Status: AC
  Administered 2013-03-31: 1000 mg via ORAL
  Filled 2013-03-31: qty 2

## 2013-03-31 MED ORDER — HYDROMORPHONE HCL PF 1 MG/ML IJ SOLN
1.0000 mg | Freq: Once | INTRAMUSCULAR | Status: AC
  Administered 2013-03-31: 1 mg via INTRAVENOUS
  Filled 2013-03-31: qty 1

## 2013-03-31 MED ORDER — CIPROFLOXACIN HCL 500 MG PO TABS
500.0000 mg | ORAL_TABLET | Freq: Two times a day (BID) | ORAL | Status: DC
  Administered 2013-04-01 – 2013-04-04 (×8): 500 mg via ORAL
  Filled 2013-03-31 (×10): qty 1

## 2013-03-31 MED ORDER — LORATADINE 10 MG PO TABS
10.0000 mg | ORAL_TABLET | Freq: Every day | ORAL | Status: DC
  Administered 2013-04-01 – 2013-04-07 (×6): 10 mg via ORAL
  Filled 2013-03-31 (×7): qty 1

## 2013-03-31 MED ORDER — DOCUSATE SODIUM 100 MG PO CAPS
100.0000 mg | ORAL_CAPSULE | Freq: Two times a day (BID) | ORAL | Status: DC
  Administered 2013-04-01 – 2013-04-03 (×6): 100 mg via ORAL
  Filled 2013-03-31 (×3): qty 1

## 2013-03-31 MED ORDER — VANCOMYCIN HCL 10 G IV SOLR
1250.0000 mg | Freq: Two times a day (BID) | INTRAVENOUS | Status: DC
  Administered 2013-04-01 – 2013-04-03 (×6): 1250 mg via INTRAVENOUS
  Filled 2013-03-31 (×6): qty 1250

## 2013-03-31 MED ORDER — DIPHENOXYLATE-ATROPINE 2.5-0.025 MG PO TABS: 1.0000 | ORAL_TABLET | Freq: Four times a day (QID) | ORAL | Status: DC | PRN

## 2013-03-31 NOTE — ED Notes (Signed)
Patient transported to X-ray 

## 2013-03-31 NOTE — ED Notes (Addendum)
Pt presents via EMS with c/o knee pain. Pt had a total knee replacement on 9/29. Per pt, he had physical therapy earlier today and was not able to control the pain after the physical therapy. Pt does have significant heat, swelling, and bruising to that area. Pt was given 100 mcg of fentanyl en route IN.

## 2013-03-31 NOTE — ED Provider Notes (Signed)
CSN: [id number]     Arrival date & time 03/31/13  1708 History   First MD Initiated Contact with Patient 03/31/13 1711     Chief Complaint  Patient presents with  . Knee Pain   (Consider location/radiation/quality/duration/timing/severity/associated sxs/prior Treatment) HPI Patient presents emergency department with left knee and calf pain.  Patient had a recent knee replacement on the left.  Patient, states he had an acute increase in pain and swelling.  The patient, states, that his physical therapy, and they evaluated his knee and felt like it had a dramatic change.  Patient denies chest pain, shortness of breath syncope dizziness, nausea, vomiting, abdominal pain, back pain Past Medical History  Diagnosis Date  . Warfarin anticoagulation   . LBP (low back pain)   . History of pulmonary embolism 2003    both lungs  . Edema     Right Leg w/ h/o DVT postphlebitic  . Hyperlipidemia   . Osteoarthritis   . Obesity   . Knee pain     left  . Peripheral vascular disease     "poor circulation in  RT leg"  . Anxiety   . Spondylolisthesis   . DVT (deep venous thrombosis) 2003    right femoral artery "from groin to knee"  . Asthma     "no attack since acupuncture in 1990's"  . Wheezing     when laying on left side  . 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 Cyst of left kidney     "benign"  . Headache(784.0)     MIGRAINES-not for a long time  . Complication of anesthesia     SLOW TO WAKE UP / DIFFICULTY RESPONDING 2003, 3 days before could use left arm, "wild/crazy sometimes"  . PONV (postoperative nausea and vomiting)     during surgery in 1990's  . H/O blood transfusion reaction 2004    FFP, extreme swelling, could not breath   Past Surgical History  Procedure Laterality Date  . Lumbar fusion  2003    Dr Jillyn Hidden  . Orchiectomy  1994    R post injury  . Knee arthroscopy  1990    RT KNEE  . Knee arthroscopy Right 2003  . Knee arthroscopy  1988    L KNEE   . Finger surgery  2012    RT THUMB RECONSTRUCTION  . Knee arthroscopy with meniscal repair Left 10/22/2012    Procedure: LEFT KNEE ARTHROSCOPY PARTIAL MEDIAL AND LATERAL MENISCECTOMY AND DEBRIDEMENT, CHONDROPLASTY ;  Surgeon: Javier Docker, MD;  Location: [facility];  Service: Orthopedics;  Laterality: Left;  . Tonsillectomy  1967  . Appendectomy  1993    Dr Jamey Ripa  . Cholecystectomy  1993  . Hemorroidectomy  1981  . Hand surgery Right     abcess  . Lumbar disc surgery    . Total knee arthroplasty Left 03/21/2013    Procedure: LEFT TOTAL KNEE ARTHROPLASTY;  Surgeon: Loanne Drilling, MD;  Location: [facility];  Service: Orthopedics;  Laterality: Left;   Family History  Problem Relation Age of Onset  . Hyperlipidemia Other   . Hypertension Other   . Parkinsonism Other   . Heart disease Mother     CABG at age 12   History  Substance Use Topics  . Smoking status: Never Smoker   . Smokeless tobacco: Never Used  . Alcohol Use: No    Review of Systems All other systems negative except as documented in  the HPI. All pertinent positives and negatives as reviewed in the HPI. Allergies  Exalgo; Fentanyl; Hydrocodone-acetaminophen; Penicillins; Sulfonamide derivatives; and Tylenol  Home Medications   Current Outpatient Rx  Name  Route  Sig  Dispense  Refill  . albuterol (PROVENTIL HFA;VENTOLIN HFA) 108 (90 BASE) MCG/ACT inhaler   Inhalation   Inhale 2 puffs into the lungs every 6 (six) hours as needed for wheezing.         . cetirizine (ZYRTEC) 10 MG tablet   Oral   Take 10 mg by mouth daily.          . clotrimazole-betamethasone (LOTRISONE) cream   Topical   Apply 1 application topically 2 (two) times daily as needed. Apply to red spots on face         . diphenoxylate-atropine (LOMOTIL) 2.5-0.025 MG per tablet   Oral   Take 1-2 tablets by mouth 4 (four) times daily as needed for diarrhea or loose stools.          . docusate sodium (COLACE) 100 MG capsule   Oral   Take  100 mg by mouth 2 (two) times daily.         . furosemide (LASIX) 20 MG tablet   Oral   Take 20 mg by mouth every morning.          [name] HYDROmorphone (DILAUDID) 2 MG tablet   Oral   Take 1-2 tablets (2-4 mg total) by mouth every 3 (three) hours as needed for pain.   90 tablet   0   . methocarbamol (ROBAXIN) 500 MG tablet   Oral   Take 1 tablet (500 mg total) by mouth every 6 (six) hours as needed.   80 tablet   0   . nortriptyline (PAMELOR) 10 MG capsule   Oral   Take 10 mg by mouth every evening.         . warfarin (COUMADIN) 6 MG tablet   Oral   Take 6 mg by mouth daily.          BP 140/47  Pulse 111  Temp(Src) 101.1 F (38.4 C) (Oral)  Resp 20  SpO2 98% Physical Exam  Nursing note and vitals reviewed. Constitutional: He appears well-developed and well-nourished. No distress.  HENT:  Head: Normocephalic and atraumatic.  Mouth/Throat: Oropharynx is clear and moist.  Eyes: Pupils are equal, round, and reactive to light.  Neck: Normal range of motion. Neck supple.  Cardiovascular: Normal rate, regular rhythm and normal heart sounds.  Exam reveals no gallop and no friction rub.   No murmur heard. Pulmonary/Chest: Effort normal and breath sounds normal. No respiratory distress.  Skin: Skin is warm and dry.    ED Course  Procedures (including critical care time) Labs Review Labs Reviewed  CBC WITH DIFFERENTIAL - Abnormal; Notable for the following:    WBC 15.2 (*)    RBC 4.13 (*)    HCT 36.7 (*)    Platelets 459 (*)    Neutrophils Relative % 80 (*)    Neutro Abs 12.2 (*)    Lymphocytes Relative 8 (*)    Monocytes Absolute 1.4 (*)    All other components within normal limits  BASIC METABOLIC PANEL - Abnormal; Notable for the following:    Sodium 126 (*)    Chloride 91 (*)    Glucose, Bld 124 (*)    All other components within normal limits  SEDIMENTATION RATE - Abnormal; Notable for the following:    Sed Rate 73 (*)  All other components within  normal limits  CULTURE, BLOOD (ROUTINE X 2)  CULTURE, BLOOD (ROUTINE X 2)  ANAEROBIC CULTURE  URINALYSIS, ROUTINE W REFLEX MICROSCOPIC  C-REACTIVE PROTEIN  BODY FLUID CELL COUNT WITH DIFFERENTIAL   Imaging Review Dg Chest 2 View  03/31/2013   CLINICAL DATA:  Weakness. History of asthma.  EXAM: CHEST  2 VIEW  COMPARISON:  10/19/2012.  FINDINGS: Poor inspiration. Interval enlargement of the cardiac silhouette and mild increase in prominence of the pulmonary vasculature. Interval small left lateral pleural effusion. Clear lungs. Thoracic spine degenerative changes.  IMPRESSION: Interval cardiomegaly and small left pleural effusion.   Electronically Signed   By: Gordan Payment M.D.   On: 03/31/2013 20:37   Dg Knee Complete 4 Views Left  03/31/2013   CLINICAL DATA:  Left knee pain, bruising, swelling and increased warmth. Recent knee surgery on 03/21/2013.  EXAM: LEFT KNEE - COMPLETE 4+ VIEW  COMPARISON:  None.  FINDINGS: Left knee total prosthesis in satisfactory position and alignment. Probable small to moderate-sized effusion. Diffuse soft tissue swelling, most pronounced anteriorly. No fracture or dislocation.  IMPRESSION: Left total knee prosthesis and probable small to moderate-sized knee effusion with soft tissue swelling.   Electronically Signed   By: Gordan Payment M.D.   On: 03/31/2013 20:36    EKG Interpretation   None       MDM   1. Edema   2. Long term current use of anticoagulant   3. OA (osteoarthritis) of knee   4. Obesity, unspecified    Patient be admitted by the orthopedist for further evaluation of possible Knee prosthesis infection.  Patient is, otherwise stable at this time   Carlyle Dolly, PA-C 04/01/13 0151

## 2013-03-31 NOTE — H&P (Signed)
Robert [name] is an 62 y.o. male.   Chief Complaint:  Left knee pain and swelling HPI:  62 y/o male now 10 days post op from L TKA presents with a 1 day h/o increased knee pain and swelling.  He c/o severe pain in the knee that started after PT today.  He denies f/c/n/v.  He says it hurts to bear weight and is severely painful.  It feels better when he's off his foot and holding it still.  No h/o previous left knee infection.  He says his post op course has been going great until today.  He has been evaluated by the ER MD including a doppler which is negative.  Past Medical History  Diagnosis Date  . Warfarin anticoagulation   . LBP (low back pain)   . History of pulmonary embolism 2003    both lungs  . Edema     Right Leg w/ h/o DVT postphlebitic  . Hyperlipidemia   . Osteoarthritis   . Obesity   . Knee pain     left  . Peripheral vascular disease     "poor circulation in  RT leg"  . Anxiety   . Spondylolisthesis   . DVT (deep venous thrombosis) 2003    right femoral artery "from groin to knee"  . Asthma     "no attack since acupuncture in 1990's"  . Wheezing     when laying on left side  . 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 Cyst of left kidney     "benign"  . Headache(784.0)     MIGRAINES-not for a long time  . Complication of anesthesia     SLOW TO WAKE UP / DIFFICULTY RESPONDING 2003, 3 days before could use left arm, "wild/crazy sometimes"  . PONV (postoperative nausea and vomiting)     during surgery in 1990's  . H/O blood transfusion reaction 2004    FFP, extreme swelling, could not breath    Past Surgical History  Procedure Laterality Date  . Lumbar fusion  2003    Dr Jillyn Hidden  . Orchiectomy  1994    R post injury  . Knee arthroscopy  1990    RT KNEE  . Knee arthroscopy Right 2003  . Knee arthroscopy  1988    L KNEE  . Finger surgery  2012    RT THUMB RECONSTRUCTION  . Knee arthroscopy with meniscal repair Left 10/22/2012   Procedure: LEFT KNEE ARTHROSCOPY PARTIAL MEDIAL AND LATERAL MENISCECTOMY AND DEBRIDEMENT, CHONDROPLASTY ;  Surgeon: Javier Docker, MD;  Location: [facility];  Service: Orthopedics;  Laterality: Left;  . Tonsillectomy  1967  . Appendectomy  1993    Dr Jamey Ripa  . Cholecystectomy  1993  . Hemorroidectomy  1981  . Hand surgery Right     abcess  . Lumbar disc surgery    . Total knee arthroplasty Left 03/21/2013    Procedure: LEFT TOTAL KNEE ARTHROPLASTY;  Surgeon: Loanne Drilling, MD;  Location: [facility];  Service: Orthopedics;  Laterality: Left;    Family History  Problem Relation Age of Onset  . Hyperlipidemia Other   . Hypertension Other   . Parkinsonism Other   . Heart disease Mother     CABG at age 2   Social History:  reports that he has never smoked. He has never used smokeless tobacco. He reports that he does not drink alcohol or use illicit drugs.  Allergies:  Allergies  Allergen Reactions  . Exalgo [Hydromorphone Hcl] Nausea Only    Sick and nauseated  . Fentanyl Other (See Comments)    REACTION: too sleepy  . Hydrocodone-Acetaminophen Nausea Only    REACTION: nausea  . Penicillins Hives  . Sulfonamide Derivatives Hives and Swelling  . Tylenol [Acetaminophen]     Liver issues    Results for orders placed during the hospital encounter of 03/31/13 (from the past 48 hour(s))  CBC WITH DIFFERENTIAL     Status: Abnormal   Collection Time    03/31/13  5:52 PM      Result Value Range   WBC 15.2 (*) 4.0 - 10.5 K/uL   RBC 4.13 (*) 4.22 - 5.81 MIL/uL   Hemoglobin 13.1  13.0 - 17.0 g/dL   HCT 02.7 (*) 25.3 - 66.4 %   MCV 88.9  78.0 - 100.0 fL   MCH 31.7  26.0 - 34.0 pg   MCHC 35.7  30.0 - 36.0 g/dL   RDW 40.3  47.4 - 25.9 %   Platelets 459 (*) 150 - 400 K/uL   Neutrophils Relative % 80 (*) 43 - 77 %   Neutro Abs 12.2 (*) 1.7 - 7.7 K/uL   Lymphocytes Relative 8 (*) 12 - 46 %   Lymphs Abs 1.2  0.7 - 4.0 K/uL   Monocytes Relative 9  3 - 12 %   Monocytes Absolute 1.4 (*) 0.1  - 1.0 K/uL   Eosinophils Relative 3  0 - 5 %   Eosinophils Absolute 0.4  0.0 - 0.7 K/uL   Basophils Relative 0  0 - 1 %   Basophils Absolute 0.1  0.0 - 0.1 K/uL  BASIC METABOLIC PANEL     Status: Abnormal   Collection Time    03/31/13  5:52 PM      Result Value Range   Sodium 126 (*) 135 - 145 mEq/L   Potassium 4.2  3.5 - 5.1 mEq/L   Chloride 91 (*) 96 - 112 mEq/L   CO2 25  19 - 32 mEq/L   Glucose, Bld 124 (*) 70 - 99 mg/dL   BUN 13  6 - 23 mg/dL   Creatinine, Ser 5.63  0.50 - 1.35 mg/dL   Calcium 9.0  8.4 - 87.5 mg/dL   GFR calc non Af Amer >90  >90 mL/min   GFR calc Af Amer >90  >90 mL/min   Comment: (NOTE)     The eGFR has been calculated using the CKD EPI equation.     This calculation has not been validated in all clinical situations.     eGFR's persistently <90 mL/min signify possible Chronic Kidney     Disease.  SEDIMENTATION RATE     Status: Abnormal   Collection Time    03/31/13  5:52 PM      Result Value Range   Sed Rate 73 (*) 0 - 16 mm/hr  URINALYSIS, ROUTINE W REFLEX MICROSCOPIC     Status: None   Collection Time    03/31/13  7:43 PM      Result Value Range   Color, Urine YELLOW  YELLOW   APPearance CLEAR  CLEAR   Specific Gravity, Urine 1.023  1.005 - 1.030   pH 7.5  5.0 - 8.0   Glucose, UA NEGATIVE  NEGATIVE mg/dL   Hgb urine dipstick NEGATIVE  NEGATIVE   Bilirubin Urine NEGATIVE  NEGATIVE   Ketones, ur NEGATIVE  NEGATIVE mg/dL   Protein, ur NEGATIVE  NEGATIVE mg/dL  Urobilinogen, UA 1.0  0.0 - 1.0 mg/dL   Nitrite NEGATIVE  NEGATIVE   Leukocytes, UA NEGATIVE  NEGATIVE   Comment: MICROSCOPIC NOT DONE ON URINES WITH NEGATIVE PROTEIN, BLOOD, LEUKOCYTES, NITRITE, OR GLUCOSE <1000 mg/dL.   Dg Chest 2 View  03/31/2013   CLINICAL DATA:  Weakness. History of asthma.  EXAM: CHEST  2 VIEW  COMPARISON:  10/19/2012.  FINDINGS: Poor inspiration. Interval enlargement of the cardiac silhouette and mild increase in prominence of the pulmonary vasculature. Interval  small left lateral pleural effusion. Clear lungs. Thoracic spine degenerative changes.  IMPRESSION: Interval cardiomegaly and small left pleural effusion.   Electronically Signed   By: Gordan Payment M.D.   On: 03/31/2013 20:37   Dg Knee Complete 4 Views Left  03/31/2013   CLINICAL DATA:  Left knee pain, bruising, swelling and increased warmth. Recent knee surgery on 03/21/2013.  EXAM: LEFT KNEE - COMPLETE 4+ VIEW  COMPARISON:  None.  FINDINGS: Left knee total prosthesis in satisfactory position and alignment. Probable small to moderate-sized effusion. Diffuse soft tissue swelling, most pronounced anteriorly. No fracture or dislocation.  IMPRESSION: Left total knee prosthesis and probable small to moderate-sized knee effusion with soft tissue swelling.   Electronically Signed   By: Gordan Payment M.D.   On: 03/31/2013 20:36    ROS as above.  No CP, SOB or calf pain.  Blood pressure 140/47, pulse 111, temperature 101.1 F (38.4 C), temperature source Oral, resp. rate 20, SpO2 98.00%. Physical Exam wn wd male in moderate distress due to pain in the left knee.  A and O x 4.  Mood and affect normal.  EOMI.  Resp unlabored.  L knee with healing incision anteriorly and a large effusion.  Moderate warmth around the knee.  Significant TTP about the knee.  5/5 strength at the quad and hamstring.  Sens to LT intact in the L LE.  No lymphadenopathy noted.  Assessment/Plan L knee effusion with fever and elevated wbc 10 days post op from L TKA - .    DVT workup is negative.  dDx includes reactive effusion after PT v. Infection.  Aspiration of the knee effusion is warranted to eval cell count and send cultures.  He and his wife understand the plan and agree.   Procedure:  After sterile prep ~100 cc of dark serosang fluid was aspirated from the left knee.  The patient tolerated the procedure well, and there were no evident complications.  Fluid sent for cell count, aerobic and anaerobic culture.   The patient will be  admitted and started on IV abx pending culture and cell count.  NPO after midnight in case he may need surgery tomorrow.  Hyponatremia - I'll start the patient on NS and recheck the BMP in the morning.  Toni Arthurs 03/31/2013, 10:42 PM

## 2013-03-31 NOTE — Progress Notes (Signed)
Left lower extremity venous duplex completed.  Left:  No evidence of DVT, superficial thrombosis, or Baker's cyst.  Right:  Negative for DVT in the common femoral vein.  

## 2013-03-31 NOTE — ED Notes (Signed)
Vascular tech at bedside. °

## 2013-03-31 NOTE — ED Notes (Signed)
Bed: IO96 Expected date:  Expected time:  Means of arrival:  Comments: EMS-knee pain

## 2013-04-01 ENCOUNTER — Encounter (HOSPITAL_COMMUNITY): Payer: Self-pay | Admitting: Orthopedic Surgery

## 2013-04-01 LAB — SYNOVIAL CELL COUNT + DIFF, W/ CRYSTALS
Crystals, Fluid: NONE SEEN
Eosinophils-Synovial: 0 % (ref 0–1)
Lymphocytes-Synovial Fld: 2 % (ref 0–20)
Monocyte-Macrophage-Synovial Fluid: 0 % — ABNORMAL LOW (ref 50–90)
Neutrophil, Synovial: 98 % — ABNORMAL HIGH (ref 0–25)
Other Cells-SYN: 0
WBC, Synovial: 34600 /mm3 — ABNORMAL HIGH (ref 0–200)

## 2013-04-01 LAB — CBC
HCT: 36.2 % — ABNORMAL LOW (ref 39.0–52.0)
Hemoglobin: 12.7 g/dL — ABNORMAL LOW (ref 13.0–17.0)
MCH: 31.3 pg (ref 26.0–34.0)
MCHC: 35.1 g/dL (ref 30.0–36.0)
MCV: 89.2 fL (ref 78.0–100.0)
Platelets: 449 10*3/uL — ABNORMAL HIGH (ref 150–400)
RBC: 4.06 MIL/uL — ABNORMAL LOW (ref 4.22–5.81)
RDW: 12.5 % (ref 11.5–15.5)
WBC: 18.3 10*3/uL — ABNORMAL HIGH (ref 4.0–10.5)

## 2013-04-01 LAB — PROTIME-INR
INR: 1.85 — ABNORMAL HIGH (ref 0.00–1.49)
Prothrombin Time: 20.8 seconds — ABNORMAL HIGH (ref 11.6–15.2)

## 2013-04-01 LAB — BASIC METABOLIC PANEL
BUN: 12 mg/dL (ref 6–23)
CO2: 24 mEq/L (ref 19–32)
Calcium: 8.7 mg/dL (ref 8.4–10.5)
Chloride: 93 mEq/L — ABNORMAL LOW (ref 96–112)
Creatinine, Ser: 0.85 mg/dL (ref 0.50–1.35)
GFR calc Af Amer: >90 mL/min (ref >90)
GFR calc non Af Amer: >90 mL/min (ref >90)
Glucose, Bld: 109 mg/dL — ABNORMAL HIGH (ref 70–99)
Potassium: 3.6 mEq/L (ref 3.5–5.1)
Sodium: 127 mEq/L — ABNORMAL LOW (ref 135–145)

## 2013-04-01 LAB — C-REACTIVE PROTEIN: CRP: 5.6 mg/dL — ABNORMAL HIGH (ref <0.60)

## 2013-04-01 MED ORDER — WARFARIN SODIUM 5 MG PO TABS
5.0000 mg | ORAL_TABLET | Freq: Once | ORAL | Status: AC
  Administered 2013-04-01: 5 mg via ORAL
  Filled 2013-04-01: qty 1

## 2013-04-01 MED ORDER — HYDROMORPHONE HCL PF 1 MG/ML IJ SOLN
0.5000 mg | INTRAMUSCULAR | Status: DC | PRN
  Administered 2013-04-01 – 2013-04-04 (×8): 0.5 mg via INTRAVENOUS
  Filled 2013-04-01 (×9): qty 1

## 2013-04-01 MED ORDER — WARFARIN SODIUM 6 MG PO TABS
6.0000 mg | ORAL_TABLET | Freq: Once | ORAL | Status: AC
  Administered 2013-04-01: 6 mg via ORAL
  Filled 2013-04-01: qty 1

## 2013-04-01 MED ORDER — WARFARIN - PHARMACIST DOSING INPATIENT: Freq: Every day | Status: DC

## 2013-04-01 MED ORDER — ONDANSETRON HCL 4 MG/2ML IJ SOLN: 4.0000 mg | INTRAMUSCULAR | Status: DC | PRN

## 2013-04-01 MED ORDER — DIAZEPAM 5 MG PO TABS
5.0000 mg | ORAL_TABLET | Freq: Four times a day (QID) | ORAL | Status: DC | PRN
  Administered 2013-04-01 – 2013-04-06 (×7): 5 mg via ORAL
  Filled 2013-04-01 (×8): qty 1

## 2013-04-01 MED ORDER — DIAZEPAM 2 MG PO TABS
1.0000 mg | ORAL_TABLET | Freq: Once | ORAL | Status: AC
  Administered 2013-04-01: 1 mg via ORAL
  Filled 2013-04-01: qty 1

## 2013-04-01 NOTE — Progress Notes (Signed)
ANTICOAGULATION CONSULT NOTE - Initial Consult  Pharmacy Consult for Warfarin Indication: Hx PE/DVT/DVT prophylaxis  Allergies  Allergen Reactions  . Exalgo [Hydromorphone Hcl] Nausea Only    Sick and nauseated  . Fentanyl Other (See Comments)    REACTION: too sleepy  . Hydrocodone-Acetaminophen Nausea Only    REACTION: nausea  . Penicillins Hives  . Sulfonamide Derivatives Hives and Swelling  . Tylenol [Acetaminophen]     Liver issues    Patient Measurements:   Wt=130 kg  Vital Signs: Temp: 98.7 F (37.1 C) (10/10 0000) Temp src: Oral (10/10 0000) BP: 129/81 mmHg (10/10 0000) Pulse Rate: 95 (10/10 0000)  Labs:  Recent Labs  03/31/13 1752 03/31/13 2325  HGB 13.1  --   HCT 36.7*  --   PLT 459*  --   LABPROT  --  24.1*  INR  --  2.24*  CREATININE 0.87  --     The CrCl is unknown because both a height and weight (above a minimum accepted value) are required for this calculation.   Medical History: Past Medical History  Diagnosis Date  . Warfarin anticoagulation   . LBP (low back pain)   . History of pulmonary embolism 2003    both lungs  . Edema     Right Leg w/ h/o DVT postphlebitic  . Hyperlipidemia   . Osteoarthritis   . Obesity   . Knee pain     left  . Peripheral vascular disease     "poor circulation in  RT leg"  . Anxiety   . Spondylolisthesis   . DVT (deep venous thrombosis) 2003    right femoral artery "from groin to knee"  . Asthma     "no attack since acupuncture in 1990's"  . Wheezing     when laying on left side  . 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 Cyst of left kidney     "benign"  . Headache(784.0)     MIGRAINES-not for a long time  . Complication of anesthesia     SLOW TO WAKE UP / DIFFICULTY RESPONDING 2003, 3 days before could use left arm, "wild/crazy sometimes"  . PONV (postoperative nausea and vomiting)     during surgery in 1990's  . H/O blood transfusion reaction 2004    FFP, extreme  swelling, could not breath    Medications:  Scheduled:  . ciprofloxacin  500 mg Oral BID  . clotrimazole   Topical BID  . docusate sodium  100 mg Oral BID  . loratadine  10 mg Oral Daily  . nortriptyline  10 mg Oral QPM  . vancomycin  1,250 mg Intravenous Q12H  . vancomycin  2,500 mg Intravenous Once  . Warfarin - Pharmacist Dosing Inpatient   Does not apply q1800   Infusions:  . sodium chloride 125 mL/hr at 04/01/13 0007    Assessment: 62 yo s/p TKA 9/29 admitted with increased swelling, bruising, pain.  On chronic coumadin for Hx of PE/DVT.  INR= 2.24 on admission  Goal of Therapy:  INR 2-3    Plan:   Warfarin 5mg  x1 ~ 0015  Daily PT/INR  Note Cipro started 10/10  Pt may go to Sx- but warfarin resumed for now  Susanne Greenhouse R 04/01/2013,12:35 AM

## 2013-04-01 NOTE — Progress Notes (Signed)
Subjective: Hospital day - 2 Patient reports pain as moderate.   Patient seen in rounds with Dr. Lequita Halt.  Wife in room at bedside Patient is having problems with pain in the knee and leg, requiring pain medications Plan is to go Home after hospital stay.  Objective: Vital signs in last 24 hours: Temp:  [96 F (35.6 C)-101.1 F (38.4 C)] 98.8 F (37.1 C) (10/10 0643) Pulse Rate:  [91-111] 98 (10/10 0643) Resp:  [16-24] 16 (10/10 0643) BP: (97-153)/(47-94) 152/81 mmHg (10/10 0643) SpO2:  [95 %-100 %] 95 % (10/10 0643) Weight:  [131.5 kg (289 lb 14.5 oz)] 131.5 kg (289 lb 14.5 oz) (10/10 0043)  Intake/Output from previous day:  Intake/Output Summary (Last 24 hours) at 04/01/13 0930 Last data filed at 04/01/13 0600  Gross per 24 hour  Intake 735.42 ml  Output    700 ml  Net  35.42 ml    Intake/Output this shift:    Labs:  Recent Labs  03/31/13 1752 04/01/13 0830  HGB 13.1 12.7*    Recent Labs  03/31/13 1752 04/01/13 0830  WBC 15.2* 18.3*  RBC 4.13* 4.06*  HCT 36.7* 36.2*  PLT 459* 449*    Recent Labs  03/31/13 1752 04/01/13 0010  NA 126* 127*  K 4.2 3.6  CL 91* 93*  CO2 25 24  BUN 13 12  CREATININE 0.87 0.85  GLUCOSE 124* 109*  CALCIUM 9.0 8.7    Recent Labs  03/31/13 2325 04/01/13 0830  INR 2.24* 1.85*    EXAM General - Patient is Alert and Appropriate Extremity - Neurovascular intact Sensation intact distally Dorsiflexion/Plantar flexion intact Incision: healing, no active drainage Compartment soft ecchymosis noted in the posterior thigh and eccynmotic discoloration on the lower 2/3s of the leg below the surgery Motor Function - intact, moving foot and toes well on exam.    Past Medical History  Diagnosis Date  . Warfarin anticoagulation   . LBP (low back pain)   . History of pulmonary embolism 2003    both lungs  . Edema     Right Leg w/ h/o DVT postphlebitic  . Hyperlipidemia   . Osteoarthritis   . Obesity   . Knee  pain     left  . Peripheral vascular disease     "poor circulation in  RT leg"  . Anxiety   . Spondylolisthesis   . DVT (deep venous thrombosis) 2003    right femoral artery "from groin to knee"  . Asthma     "no attack since acupuncture in 1990's"  . Wheezing     when laying on left side  . 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 Cyst of left kidney     "benign"  . Headache(784.0)     MIGRAINES-not for a long time  . Complication of anesthesia     SLOW TO WAKE UP / DIFFICULTY RESPONDING 2003, 3 days before could use left arm, "wild/crazy sometimes"  . PONV (postoperative nausea and vomiting)     during surgery in 1990's  . H/O blood transfusion reaction 2004    FFP, extreme swelling, could not breath    Assessment/Plan: Hospital day - 2 Active Problems:   * No active hospital problems. *  Estimated body mass index is 40.45 kg/(m^2) as calculated from the following:   Height as of this encounter: 5\' 11"  (1.803 m).   Weight as of this encounter: 131.5 kg (289 lb 14.5 oz).  Up with therapy Continue elevation when not up ambulating.  Keep today and home tomorrow. Changed robaxin to valium. Probably home tomorrow.  PERKINS, ALEXZANDREW 04/01/2013, 9:30 AM

## 2013-04-01 NOTE — Progress Notes (Signed)
ANTICOAGULATION CONSULT NOTE - Follow Up Consult  Pharmacy Consult for Warfarin Indication: Hx PE/DVT/DVT prophylaxis  Allergies  Allergen Reactions  . Exalgo [Hydromorphone Hcl] Nausea Only    Sick and nauseated  . Fentanyl Other (See Comments)    REACTION: too sleepy  . Hydrocodone-Acetaminophen Nausea Only    REACTION: nausea  . Penicillins Hives  . Sulfonamide Derivatives Hives and Swelling  . Tylenol [Acetaminophen]     Liver issues    Patient Measurements: Height: 5\' 11"  (180.3 cm) Weight: 289 lb 14.5 oz (131.5 kg) IBW/kg (Calculated) : 75.3  Vital Signs: Temp: 98.8 F (37.1 C) (10/10 0643) Temp src: Oral (10/10 0643) BP: 152/81 mmHg (10/10 0643) Pulse Rate: 98 (10/10 0643)  Labs:  Recent Labs  03/31/13 1752 03/31/13 2325 04/01/13 0010 04/01/13 0830  HGB 13.1  --   --  12.7*  HCT 36.7*  --   --  36.2*  PLT 459*  --   --  449*  LABPROT  --  24.1*  --  20.8*  INR  --  2.24*  --  1.85*  CREATININE 0.87  --  0.85  --     Estimated Creatinine Clearance: 126.2 ml/min (by C-G formula based on Cr of 0.85).   Medications:  Scheduled:  . ciprofloxacin  500 mg Oral BID  . clotrimazole   Topical BID  . docusate sodium  100 mg Oral BID  . loratadine  10 mg Oral Daily  . nortriptyline  10 mg Oral QPM  . vancomycin  1,250 mg Intravenous Q12H  . Warfarin - Pharmacist Dosing Inpatient   Does not apply q1800   Infusions:  . sodium chloride 125 mL/hr at 04/01/13 0007    Assessment: 62 yo s/p TKA 9/29 admitted with increased swelling, bruising, pain. On chronic coumadin for Hx of PE/DVT.  Home dose reported as 6mg  daily  INR slightly subtherapeutic today (1.85)  CBC stable, no bleeding noted  Potential drug interaction with cipro noted - monitor  Goal of Therapy:  INR 2-3   Plan:   Warfarin 6mg  x 1 today  Daily PT/INR  Loralee Pacas, PharmD, BCPS Pager: [phone] 04/01/2013,9:49 AM

## 2013-04-01 NOTE — Progress Notes (Signed)
ANTIBIOTIC CONSULT NOTE - INITIAL  Pharmacy Consult for Vancomycin Indication: Infected L TKA (9/29)  Allergies  Allergen Reactions  . Exalgo [Hydromorphone Hcl] Nausea Only    Sick and nauseated  . Fentanyl Other (See Comments)    REACTION: too sleepy  . Hydrocodone-Acetaminophen Nausea Only    REACTION: nausea  . Penicillins Hives  . Sulfonamide Derivatives Hives and Swelling  . Tylenol [Acetaminophen]     Liver issues    Patient Measurements:   Wt=131 kg  Vital Signs: Temp: 98.7 F (37.1 C) (10/10 0000) Temp src: Oral (10/10 0000) BP: 129/81 mmHg (10/10 0000) Pulse Rate: 95 (10/10 0000) Intake/Output from previous day:   Intake/Output from this shift:    Labs:  Recent Labs  03/31/13 1752  WBC 15.2*  HGB 13.1  PLT 459*  CREATININE 0.87   The CrCl is unknown because both a height and weight (above a minimum accepted value) are required for this calculation. No results found for this basename: VANCOTROUGH, Leodis Binet, VANCORANDOM, GENTTROUGH, GENTPEAK, GENTRANDOM, TOBRATROUGH, TOBRAPEAK, TOBRARND, AMIKACINPEAK, AMIKACINTROU, AMIKACIN,  in the last 72 hours   Microbiology: Recent Results (from the past 720 hour(s))  SURGICAL PCR SCREEN     Status: Abnormal   Collection Time    03/14/13 11:10 AM      Result Value Range Status   MRSA, PCR NEGATIVE  NEGATIVE Final   Staphylococcus aureus POSITIVE (*) NEGATIVE Final   Comment:            The Xpert SA Assay (FDA     approved for NASAL specimens     in patients over 69 years of age),     is one component of     a comprehensive surveillance     program.  Test performance has     been validated by [employer] for patients greater     than or equal to 29 year old.     It is not intended     to diagnose infection nor to     guide or monitor treatment.    Medical History: Past Medical History  Diagnosis Date  . Warfarin anticoagulation   . LBP (low back pain)   . History of pulmonary embolism 2003   both lungs  . Edema     Right Leg w/ h/o DVT postphlebitic  . Hyperlipidemia   . Osteoarthritis   . Obesity   . Knee pain     left  . Peripheral vascular disease     "poor circulation in  RT leg"  . Anxiety   . Spondylolisthesis   . DVT (deep venous thrombosis) 2003    right femoral artery "from groin to knee"  . Asthma     "no attack since acupuncture in 1990's"  . Wheezing     when laying on left side  . 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 Cyst of left kidney     "benign"  . Headache(784.0)     MIGRAINES-not for a long time  . Complication of anesthesia     SLOW TO WAKE UP / DIFFICULTY RESPONDING 2003, 3 days before could use left arm, "wild/crazy sometimes"  . PONV (postoperative nausea and vomiting)     during surgery in 1990's  . H/O blood transfusion reaction 2004    FFP, extreme swelling, could not breath    Medications:  Scheduled:  . ciprofloxacin  500 mg Oral BID  .  clotrimazole   Topical BID  . docusate sodium  100 mg Oral BID  . loratadine  10 mg Oral Daily  . nortriptyline  10 mg Oral QPM  . vancomycin  1,250 mg Intravenous Q12H  . vancomycin  2,500 mg Intravenous Once  . Warfarin - Pharmacist Dosing Inpatient   Does not apply q1800   Infusions:  . sodium chloride 125 mL/hr at 04/01/13 0007   Assessment: 62 yo s/p L TKA on 9/29 presents today with increased knee pain and swelling. Vanc per RX  Goal of Therapy:  Vancomycin trough level 15-20 mcg/ml  Plan:   Vancomycin 2500mg  IV x1 load  Vancomycin 1250mg  IV q12h.  CrCl~79 (N)  F/u Scr/levels/cultures as needed  Susanne Greenhouse R 04/01/2013,12:22 AM

## 2013-04-01 NOTE — Care Management Note (Signed)
  Page 1 of 1   04/01/2013     8:00:52 PM   CARE MANAGEMENT NOTE 04/01/2013  Patient:  Robert [name], Robert [name]   Account Number:  [id number]  Date Initiated:  04/01/2013  Documentation initiated by:  Colleen Can  Subjective/Objective Assessment:   dx left knee pain and swelling. s/p knee replacemnt 03/21/2013     Action/Plan:   CM spoke with patient and spouse. Plans are por patient to return [place] where spouse will be caregiver. Pt is currently active with [facility] . Already has DME   Anticipated DC Date:  04/03/2013   Anticipated DC Plan:  HOME W HOME HEALTH SERVICES      DC Planning Services  CM consult      [facility] Choice  HOME HEALTH   Choice offered to / List presented to:  C-1 Patient           [facility] agency  [facility]   Status of service:  In process, will continue to follow Medicare Important Message given?   (If response is "NO", the following Medicare IM given date fields will be blank) Date Medicare IM given:   Date Additional Medicare IM given:    Discharge Disposition:    Per UR Regulation:  Reviewed for med. necessity/level of care/duration of stay  If discussed at Long Length of Stay Meetings, dates discussed:    Comments:  04/01/2013 Colleen Can BSN RN CCM [phone] [place] aware that patient is in hospital will follow for orders as needed for hh services.

## 2013-04-02 LAB — PROTIME-INR
INR: 1.96 — ABNORMAL HIGH (ref 0.00–1.49)
Prothrombin Time: 21.7 seconds — ABNORMAL HIGH (ref 11.6–15.2)

## 2013-04-02 MED ORDER — WARFARIN SODIUM 6 MG PO TABS
6.0000 mg | ORAL_TABLET | Freq: Once | ORAL | Status: DC
  Filled 2013-04-02: qty 1

## 2013-04-02 NOTE — Progress Notes (Signed)
ANTICOAGULATION CONSULT NOTE - Follow Up Consult  Pharmacy Consult for Warfarin Indication: Hx PE/DVT/DVT prophylaxis  Allergies  Allergen Reactions  . Exalgo [Hydromorphone Hcl] Nausea Only    Sick and nauseated  . Fentanyl Other (See Comments)    REACTION: too sleepy  . Hydrocodone-Acetaminophen Nausea Only    REACTION: nausea  . Penicillins Hives  . Sulfonamide Derivatives Hives and Swelling  . Tylenol [Acetaminophen]     Liver issues    Patient Measurements: Height: 5\' 11"  (180.3 cm) Weight: 289 lb 14.5 oz (131.5 kg) IBW/kg (Calculated) : 75.3  Vital Signs: Temp: 100.6 F (38.1 C) (10/11 0519) Temp src: Oral (10/11 0519) BP: 135/75 mmHg (10/11 0519) Pulse Rate: 106 (10/11 0519)  Labs:  Recent Labs  03/31/13 1752 03/31/13 2325 04/01/13 0010 04/01/13 0830 04/02/13 0443  HGB 13.1  --   --  12.7*  --   HCT 36.7*  --   --  36.2*  --   PLT 459*  --   --  449*  --   LABPROT  --  24.1*  --  20.8* 21.7*  INR  --  2.24*  --  1.85* 1.96*  CREATININE 0.87  --  0.85  --   --     Estimated Creatinine Clearance: 126.2 ml/min (by C-G formula based on Cr of 0.85).   Medications:  Scheduled:  . ciprofloxacin  500 mg Oral BID  . clotrimazole   Topical BID  . docusate sodium  100 mg Oral BID  . loratadine  10 mg Oral Daily  . nortriptyline  10 mg Oral QPM  . vancomycin  1,250 mg Intravenous Q12H  . Warfarin - Pharmacist Dosing Inpatient   Does not apply q1800   Infusions:  . sodium chloride 20 mL/hr at 04/02/13 0600    Assessment: 62 yo s/p TKA 9/29 admitted with increased swelling, bruising, pain. On chronic coumadin for Hx of PE/DVT.  Home dose reported as 6mg  daily  INR almost therapeutic today 1.96  CBC stable, no bleeding noted  Potential drug interaction with cipro noted - monitor  Goal of Therapy:  INR 2-3   Plan:   Warfarin 6mg  x 1 today  Daily PT/INR  Loralee Pacas, PharmD, BCPS Pager: [phone] 04/02/2013,8:26 AM

## 2013-04-02 NOTE — Progress Notes (Signed)
   Subjective: Hospital day - 3  Patient reports pain as moderate, pain better controlled with Valium, but still not able to bear much weight on the left leg. Complaining of pain in the right knee with swelling and pain shooting to the right foot.  Plan is to eventually return home after hospital stay.    Objective:   VITALS:   Filed Vitals:   04/02/13 0519  BP: 135/75  Pulse: 106  Temp: 100.6 F (38.1 C)  Resp: 18    Dorsiflexion/Plantar flexion intact Incision: dressing C/D/I No cellulitis present Compartment soft  LABS  Recent Labs  03/31/13 1752 04/01/13 0830  HGB 13.1 12.7*  HCT 36.7* 36.2*  WBC 15.2* 18.3*  PLT 459* 449*     Recent Labs  03/31/13 1752 04/01/13 0010  NA 126* 127*  K 4.2 3.6  BUN 13 12  CREATININE 0.87 0.85  GLUCOSE 124* 109*     Assessment/Plan: Hospital day - 3  Up with therapy as tolerated Continue elevation when not up ambulating.  Continue ice to decrease inflammation and swelling. Possibly home tomorrow if pain continues to improve.  Anastasio Auerbach Michaelina Blandino   PAC  04/02/2013, 9:12 AM

## 2013-04-03 LAB — BODY FLUID CULTURE: Special Requests: NORMAL

## 2013-04-03 LAB — VANCOMYCIN, TROUGH: Vancomycin Tr: 8 ug/mL — ABNORMAL LOW (ref 10.0–20.0)

## 2013-04-03 LAB — PROTIME-INR
INR: 1.58 — ABNORMAL HIGH (ref 0.00–1.49)
Prothrombin Time: 18.4 seconds — ABNORMAL HIGH (ref 11.6–15.2)

## 2013-04-03 MED ORDER — ENOXAPARIN SODIUM 40 MG/0.4ML ~~LOC~~ SOLN
40.0000 mg | Freq: Once | SUBCUTANEOUS | Status: AC
  Administered 2013-04-03: 12:00:00 40 mg via SUBCUTANEOUS
  Filled 2013-04-03: qty 0.4

## 2013-04-03 MED ORDER — DIPHENHYDRAMINE HCL 25 MG PO CAPS
25.0000 mg | ORAL_CAPSULE | Freq: Four times a day (QID) | ORAL | Status: DC | PRN
  Administered 2013-04-03 – 2013-04-05 (×2): 25 mg via ORAL
  Filled 2013-04-03 (×2): qty 1

## 2013-04-03 MED ORDER — VANCOMYCIN HCL 10 G IV SOLR
1500.0000 mg | Freq: Two times a day (BID) | INTRAVENOUS | Status: DC
  Administered 2013-04-04 – 2013-04-05 (×3): 1500 mg via INTRAVENOUS
  Filled 2013-04-03 (×6): qty 1500

## 2013-04-03 NOTE — Progress Notes (Signed)
ANTIBIOTIC CONSULT NOTE - FOLLOW UP  Pharmacy Consult for Vancomycin Indication: Infected L TKA (9/29)  Allergies  Allergen Reactions  . Exalgo [Hydromorphone Hcl] Nausea Only    Sick and nauseated  . Fentanyl Other (See Comments)    REACTION: too sleepy  . Hydrocodone-Acetaminophen Nausea Only    REACTION: nausea  . Penicillins Hives  . Sulfonamide Derivatives Hives and Swelling  . Tylenol [Acetaminophen]     Liver issues    Patient Measurements: Height: 5\' 11"  (180.3 cm) Weight: 289 lb 14.5 oz (131.5 kg) IBW/kg (Calculated) : 75.3  Vital Signs: Temp: 99.5 F (37.5 C) (10/12 0452) Temp src: Oral (10/12 0452) BP: 126/76 mmHg (10/12 0452) Pulse Rate: 93 (10/12 0452) Intake/Output from previous day: 10/11 0701 - 10/12 0700 In: 2955 [P.O.:1340; I.V.:1615] Out: 700 [Urine:700] Intake/Output from this shift:    Labs:  Recent Labs  03/31/13 1752 04/01/13 0010 04/01/13 0830  WBC 15.2*  --  18.3*  HGB 13.1  --  12.7*  PLT 459*  --  449*  CREATININE 0.87 0.85  --    Estimated Creatinine Clearance: 126.2 ml/min (by C-G formula based on Cr of 0.85).  92 ml/min/1.41m2 (normalized) No results found for this basename: VANCOTROUGH, VANCOPEAK, VANCORANDOM, GENTTROUGH, GENTPEAK, GENTRANDOM, TOBRATROUGH, TOBRAPEAK, TOBRARND, AMIKACINPEAK, AMIKACINTROU, AMIKACIN,  in the last 72 hours   Microbiology: Recent Results (from the past 720 hour(s))  SURGICAL PCR SCREEN     Status: Abnormal   Collection Time    03/14/13 11:10 AM      Result Value Range Status   MRSA, PCR NEGATIVE  NEGATIVE Final   Staphylococcus aureus POSITIVE (*) NEGATIVE Final   Comment:            The Xpert SA Assay (FDA     approved for NASAL specimens     in patients over 61 years of age),     is one component of     a comprehensive surveillance     program.  Test performance has     been validated by [employer] for patients greater     than or equal to 61 year old.     It is not intended   to diagnose infection nor to     guide or monitor treatment.  CULTURE, BLOOD (ROUTINE X 2)     Status: None   Collection Time    03/31/13  7:45 PM      Result Value Range Status   Specimen Description BLOOD RIGHT ARM   Final   Special Requests BOTTLES DRAWN AEROBIC AND ANAEROBIC 5CC   Final   Culture  Setup Time     Final   Value: 04/01/2013 02:13     Performed at [facility]   Culture     Final   Value:        BLOOD CULTURE RECEIVED NO GROWTH TO DATE CULTURE WILL BE HELD FOR 5 DAYS BEFORE ISSUING A FINAL NEGATIVE REPORT     Performed at [facility]   Report Status PENDING   Incomplete  CULTURE, BLOOD (ROUTINE X 2)     Status: None   Collection Time    03/31/13  7:55 PM      Result Value Range Status   Specimen Description BLOOD LEFT ARM   Final   Special Requests BOTTLES DRAWN AEROBIC AND ANAEROBIC 3CC   Final   Culture  Setup Time     Final   Value: 04/01/2013  02:13     Performed at [facility]     Final   Value:        BLOOD CULTURE RECEIVED NO GROWTH TO DATE CULTURE WILL BE HELD FOR 5 DAYS BEFORE ISSUING A FINAL NEGATIVE REPORT     Performed at [facility]   Report Status PENDING   Incomplete  ANAEROBIC CULTURE     Status: None   Collection Time    03/31/13 10:49 PM      Result Value Range Status   Specimen Description KNEE   Final   Special Requests NONE   Final   Gram Stain     Final   Value: FEW WBC PRESENT, PREDOMINANTLY PMN     NO ORGANISMS SEEN     Performed at [facility]   Culture     Final   Value: NO ANAEROBES ISOLATED; CULTURE IN PROGRESS FOR 5 DAYS     Performed at [facility]   Report Status PENDING   Incomplete  BODY FLUID CULTURE     Status: None   Collection Time    03/31/13 10:49 PM      Result Value Range Status   Specimen Description SYNOVIAL KNEE   Final   Special Requests Normal   Final   Gram Stain     Final   Value: FEW WBC PRESENT, PREDOMINANTLY PMN     NO ORGANISMS SEEN      Performed at [facility]   Culture     Final   Value: FEW STAPHYLOCOCCUS AUREUS     Note: RIFAMPIN AND GENTAMICIN SHOULD NOT BE USED AS SINGLE DRUGS FOR TREATMENT OF STAPH INFECTIONS.     Performed at [facility]   Report Status 04/03/2013 FINAL   Final   Organism ID, Bacteria STAPHYLOCOCCUS AUREUS   Final   Anti-infectives: 10/9 >> Vancomycin >> 10/9 >> Cipro >>  Assessment: 62 yo s/p TKR 9/29 admitted 03/31/13 with left knee pain, swelling, bruising. Vancomycin per Pharmacy and Cipro per MD started for possible infection.  Day #4 antibiotics. Had initially held off on checking vancomycin trough as plan was for discharge today. However, plan is now to return to OR Monday for I&D due to isolation of staph from knee aspirate.  SCr remains stable  WBC elevated  Tm 100.6  Would typically recommend changing to a penicillin or cephalosporin for better MSSA coverage, however, patient reports hives with penicillins. Consider requesting an ID consult.  Goal of Therapy:  Vancomycin trough level 15-20 mcg/ml  Plan:   Check vancomycin trough tonight Follow up renal function & cultures F/u plans for resuming warfarin after procedure monday  Loralee Pacas, PharmD, BCPS Pager: [phone] 04/03/2013,9:58 AM

## 2013-04-03 NOTE — Progress Notes (Signed)
Robert [name]  MRN: [id number] DOB/Age: [birth year] 62 y.o. Physician: Jacquelyne Balint Procedure:       Subjective: Still with ongoing c/o of pain in left knee. Patient was seen yesterday evening by Dr. Charlann Boxer according to patient there is possibility of I&D of L knee as there are Staph isolated in his aspirate.  Vital Signs Temp:  [99.5 F (37.5 C)-100.2 F (37.9 C)] 99.5 F (37.5 C) (10/12 0452) Pulse Rate:  [93-103] 93 (10/12 0452) Resp:  [16-20] 20 (10/12 0452) BP: (122-126)/(73-76) 126/76 mmHg (10/12 0452) SpO2:  [94 %-96 %] 94 % (10/12 0452)  Lab Results  Recent Labs  03/31/13 1752 04/01/13 0830  WBC 15.2* 18.3*  HGB 13.1 12.7*  HCT 36.7* 36.2*  PLT 459* 449*   BMET  Recent Labs  03/31/13 1752 04/01/13 0010  NA 126* 127*  K 4.2 3.6  CL 91* 93*  CO2 25 24  GLUCOSE 124* 109*  BUN 13 12  CREATININE 0.87 0.85  CALCIUM 9.0 8.7   INR  Date Value Range Status  04/03/2013 1.58* 0.00 - 1.49 Final  02/01/2013 2.8   Final     POC INR  Date Value Range Status  09/09/2010 3   Final     Exam Left knee and thigh diffusely bruised Increased severe pain with attempts at motion  Cultures from aspirate 10/9 positive for Staph Aureus, sensitivities pending        Plan I called Dr. Charlann Boxer who apparently talked with Dr. Lequita Halt Will pre op him for probable I&D tomorrow Cont IV ABX  [facility] for Dr.Kevin Supple 04/03/2013, 8:56 AM

## 2013-04-03 NOTE — Progress Notes (Signed)
ANTIBIOTIC CONSULT NOTE - FOLLOW UP  Pharmacy Consult for Vancomycin Indication: Infected L TKA (9/29)  Allergies  Allergen Reactions  . Exalgo [Hydromorphone Hcl] Nausea Only    Sick and nauseated  . Fentanyl Other (See Comments)    REACTION: too sleepy  . Hydrocodone-Acetaminophen Nausea Only    REACTION: nausea  . Penicillins Hives  . Sulfonamide Derivatives Hives and Swelling  . Tylenol [Acetaminophen]     Liver issues    Patient Measurements: Height: 5\' 11"  (180.3 cm) Weight: 289 lb 14.5 oz (131.5 kg) IBW/kg (Calculated) : 75.3  Vital Signs: Temp: 100.2 F (37.9 C) (10/12 2200) Temp src: Oral (10/12 2200) BP: 126/72 mmHg (10/12 2200) Pulse Rate: 97 (10/12 2200) Intake/Output from previous day: 10/11 0701 - 10/12 0700 In: 2955 [P.O.:1340; I.V.:1615] Out: 700 [Urine:700] Intake/Output from this shift: Total I/O In: 360 [P.O.:360] Out: 300 [Urine:300]  Labs:  Recent Labs  04/01/13 0010 04/01/13 0830  WBC  --  18.3*  HGB  --  12.7*  PLT  --  449*  CREATININE 0.85  --    Estimated Creatinine Clearance: 126.2 ml/min (by C-G formula based on Cr of 0.85).  92 ml/min/1.80m2 (normalized)  Recent Labs  04/03/13 2302  VANCOTROUGH 8.0*     Microbiology: Recent Results (from the past 720 hour(s))  SURGICAL PCR SCREEN     Status: Abnormal   Collection Time    03/14/13 11:10 AM      Result Value Range Status   MRSA, PCR NEGATIVE  NEGATIVE Final   Staphylococcus aureus POSITIVE (*) NEGATIVE Final   Comment:            The Xpert SA Assay (FDA     approved for NASAL specimens     in patients over 21 years of age),     is one component of     a comprehensive surveillance     program.  Test performance has     been validated by [facility] for patients greater     than or equal to 77 year old.     It is not intended     to diagnose infection nor to     guide or monitor treatment.  CULTURE, BLOOD (ROUTINE X 2)     Status: None   Collection Time   03/31/13  7:45 PM      Result Value Range Status   Specimen Description BLOOD RIGHT ARM   Final   Special Requests BOTTLES DRAWN AEROBIC AND ANAEROBIC 5CC   Final   Culture  Setup Time     Final   Value: 04/01/2013 02:13     Performed at [facility]   Culture     Final   Value:        BLOOD CULTURE RECEIVED NO GROWTH TO DATE CULTURE WILL BE HELD FOR 5 DAYS BEFORE ISSUING A FINAL NEGATIVE REPORT     Performed at [facility]   Report Status PENDING   Incomplete  CULTURE, BLOOD (ROUTINE X 2)     Status: None   Collection Time    03/31/13  7:55 PM      Result Value Range Status   Specimen Description BLOOD LEFT ARM   Final   Special Requests BOTTLES DRAWN AEROBIC AND ANAEROBIC 3CC   Final   Culture  Setup Time     Final   Value: 04/01/2013 02:13     Performed at [facility]  Culture     Final   Value:        BLOOD CULTURE RECEIVED NO GROWTH TO DATE CULTURE WILL BE HELD FOR 5 DAYS BEFORE ISSUING A FINAL NEGATIVE REPORT     Performed at [facility]   Report Status PENDING   Incomplete  ANAEROBIC CULTURE     Status: None   Collection Time    03/31/13 10:49 PM      Result Value Range Status   Specimen Description KNEE   Final   Special Requests NONE   Final   Gram Stain     Final   Value: FEW WBC PRESENT, PREDOMINANTLY PMN     NO ORGANISMS SEEN     Performed at [facility]   Culture     Final   Value: NO ANAEROBES ISOLATED; CULTURE IN PROGRESS FOR 5 DAYS     Performed at [facility]   Report Status PENDING   Incomplete  BODY FLUID CULTURE     Status: None   Collection Time    03/31/13 10:49 PM      Result Value Range Status   Specimen Description SYNOVIAL KNEE   Final   Special Requests Normal   Final   Gram Stain     Final   Value: FEW WBC PRESENT, PREDOMINANTLY PMN     NO ORGANISMS SEEN     Performed at [facility]   Culture     Final   Value: FEW STAPHYLOCOCCUS AUREUS     Note: RIFAMPIN AND GENTAMICIN SHOULD  NOT BE USED AS SINGLE DRUGS FOR TREATMENT OF STAPH INFECTIONS.     Performed at [facility]   Report Status 04/03/2013 FINAL   Final   Organism ID, Bacteria STAPHYLOCOCCUS AUREUS   Final   Anti-infectives: 10/9 >> Vancomycin >> 10/9 >> Cipro >>  Assessment: 62 yo s/p TKR 9/29 admitted 03/31/13 with left knee pain, swelling, bruising. Vancomycin per Pharmacy and Cipro per MD started for possible infection.  Day #4 antibiotics. Plan is now to return to OR Monday for I&D due to isolation of staph from knee aspirate.  SCr remains stable  WBC elevated (18.2)  Tm 100.2  Vancomycin trough = 8 mcg/ml (subtherapeutic) on regimen of 1250mg  IV q12h.  Vancomycin 1250mg  dose last given @ 23:35 so will adjust regimen with next scheduled dose  Would typically recommend changing to a penicillin or cephalosporin for better MSSA coverage, however, patient reports hives with penicillins. Consider requesting an ID consult.  Goal of Therapy:  Vancomycin trough level 15-20 mcg/ml  Plan:   Increase Vancomycin to 1500mg  IV q12h (will begin with next dose) Follow up renal function & cultures F/u plans for resuming warfarin after procedure monday  Terrilee Files, PharmD  04/03/2013,11:55 PM

## 2013-04-04 ENCOUNTER — Encounter (HOSPITAL_COMMUNITY)
Admission: EM | Disposition: A | Discharge status: Home Health | Payer: Self-pay | Source: Home / Self Care | Attending: Orthopedic Surgery

## 2013-04-04 ENCOUNTER — Inpatient Hospital Stay (HOSPITAL_COMMUNITY): Payer: 59 | Admitting: Anesthesiology

## 2013-04-04 ENCOUNTER — Encounter (HOSPITAL_COMMUNITY): Payer: Self-pay | Admitting: Anesthesiology

## 2013-04-04 ENCOUNTER — Encounter (HOSPITAL_COMMUNITY): Payer: 59 | Admitting: Anesthesiology

## 2013-04-04 DIAGNOSIS — I471 Supraventricular tachycardia: Secondary | ICD-10-CM

## 2013-04-04 HISTORY — PX: I & D KNEE WITH POLY EXCHANGE: SHX5024

## 2013-04-04 LAB — C-REACTIVE PROTEIN: CRP: 20.5 mg/dL — ABNORMAL HIGH (ref <0.60)

## 2013-04-04 LAB — CBC WITH DIFFERENTIAL/PLATELET
Basophils Absolute: 0 10*3/uL (ref 0.0–0.1)
Basophils Relative: 0 % (ref 0–1)
Eosinophils Absolute: 1 10*3/uL — ABNORMAL HIGH (ref 0.0–0.7)
Eosinophils Relative: 10 % — ABNORMAL HIGH (ref 0–5)
HCT: 31.3 % — ABNORMAL LOW (ref 39.0–52.0)
Hemoglobin: 10.7 g/dL — ABNORMAL LOW (ref 13.0–17.0)
Lymphocytes Relative: 12 % (ref 12–46)
Lymphs Abs: 1.3 10*3/uL (ref 0.7–4.0)
MCH: 30.7 pg (ref 26.0–34.0)
MCHC: 34.2 g/dL (ref 30.0–36.0)
MCV: 89.7 fL (ref 78.0–100.0)
Monocytes Absolute: 1.2 10*3/uL — ABNORMAL HIGH (ref 0.1–1.0)
Monocytes Relative: 11 % (ref 3–12)
Neutro Abs: 7 10*3/uL (ref 1.7–7.7)
Neutrophils Relative %: 66 % (ref 43–77)
Platelets: 446 10*3/uL — ABNORMAL HIGH (ref 150–400)
RBC: 3.49 MIL/uL — ABNORMAL LOW (ref 4.22–5.81)
RDW: 12.7 % (ref 11.5–15.5)
WBC: 10.5 10*3/uL (ref 4.0–10.5)

## 2013-04-04 LAB — SURGICAL PCR SCREEN
MRSA, PCR: NEGATIVE
Staphylococcus aureus: NEGATIVE

## 2013-04-04 LAB — CK TOTAL AND CKMB (NOT AT ARMC)
CK, MB: 2.2 ng/mL (ref 0.3–4.0)
Relative Index: INVALID (ref 0.0–2.5)
Total CK: 90 U/L (ref 7–232)

## 2013-04-04 LAB — MAGNESIUM: Magnesium: 1.7 mg/dL (ref 1.5–2.5)

## 2013-04-04 LAB — TROPONIN I: Troponin I: <0.3 ng/mL (ref <0.30)

## 2013-04-04 LAB — PROTIME-INR
INR: 1.36 (ref 0.00–1.49)
Prothrombin Time: 16.4 seconds — ABNORMAL HIGH (ref 11.6–15.2)

## 2013-04-04 LAB — SEDIMENTATION RATE: Sed Rate: 105 mm/hr — ABNORMAL HIGH (ref 0–16)

## 2013-04-04 SURGERY — IRRIGATION AND DEBRIDEMENT KNEE WITH POLY EXCHANGE
Anesthesia: General | Laterality: Left | Wound class: Clean Contaminated

## 2013-04-04 MED ORDER — ACETAMINOPHEN 500 MG PO TABS
ORAL_TABLET | ORAL | Status: AC
  Filled 2013-04-04: qty 1

## 2013-04-04 MED ORDER — MENTHOL 3 MG MT LOZG: 1.0000 | LOZENGE | OROMUCOSAL | Status: DC | PRN

## 2013-04-04 MED ORDER — MIDAZOLAM HCL 5 MG/5ML IJ SOLN
INTRAMUSCULAR | Status: DC | PRN
  Administered 2013-04-04: 2 mg via INTRAVENOUS

## 2013-04-04 MED ORDER — HYDROMORPHONE HCL PF 1 MG/ML IJ SOLN
0.5000 mg | INTRAMUSCULAR | Status: DC | PRN
  Administered 2013-04-04 – 2013-04-05 (×4): 1 mg via INTRAVENOUS
  Filled 2013-04-04 (×4): qty 1

## 2013-04-04 MED ORDER — SODIUM CHLORIDE 0.9 % IJ SOLN
INTRAMUSCULAR | Status: AC
  Filled 2013-04-04: qty 3

## 2013-04-04 MED ORDER — ONDANSETRON HCL 4 MG PO TABS: 4.0000 mg | ORAL_TABLET | Freq: Four times a day (QID) | ORAL | Status: DC | PRN

## 2013-04-04 MED ORDER — ADENOSINE 12 MG/4ML IV SOLN
12.0000 mg | Freq: Once | INTRAVENOUS | Status: DC
  Administered 2013-04-04: 6 mg via INTRAVENOUS
  Filled 2013-04-04: qty 4

## 2013-04-04 MED ORDER — WARFARIN SODIUM 4 MG PO TABS
8.0000 mg | ORAL_TABLET | Freq: Once | ORAL | Status: AC
  Administered 2013-04-04: 8 mg via ORAL
  Filled 2013-04-04: qty 2

## 2013-04-04 MED ORDER — SODIUM CHLORIDE 0.9 % IV SOLN
INTRAVENOUS | Status: DC
  Administered 2013-04-04: 100 mL via INTRAVENOUS

## 2013-04-04 MED ORDER — NITROGLYCERIN IN D5W 200-5 MCG/ML-% IV SOLN: 2.0000 ug/min | INTRAVENOUS | Status: DC

## 2013-04-04 MED ORDER — BISACODYL 10 MG RE SUPP
10.0000 mg | Freq: Every day | RECTAL | Status: DC | PRN
  Filled 2013-04-04: qty 1

## 2013-04-04 MED ORDER — FENTANYL CITRATE 0.05 MG/ML IJ SOLN
INTRAMUSCULAR | Status: DC | PRN
  Administered 2013-04-04 (×2): 50 ug via INTRAVENOUS
  Administered 2013-04-04: 100 ug via INTRAVENOUS
  Administered 2013-04-04: 50 ug via INTRAVENOUS

## 2013-04-04 MED ORDER — VANCOMYCIN HCL 1000 MG IV SOLR
INTRAVENOUS | Status: DC | PRN
  Administered 2013-04-04: 17:00:00 1000 mg

## 2013-04-04 MED ORDER — LACTATED RINGERS IV SOLN: INTRAVENOUS | Status: DC

## 2013-04-04 MED ORDER — PHENOL 1.4 % MT LIQD: 1.0000 | OROMUCOSAL | Status: DC | PRN

## 2013-04-04 MED ORDER — POLYETHYLENE GLYCOL 3350 17 G PO PACK
17.0000 g | PACK | Freq: Every day | ORAL | Status: DC | PRN
  Filled 2013-04-04 (×2): qty 1

## 2013-04-04 MED ORDER — ONDANSETRON HCL 4 MG/2ML IJ SOLN: 4.0000 mg | Freq: Four times a day (QID) | INTRAMUSCULAR | Status: DC | PRN

## 2013-04-04 MED ORDER — PROMETHAZINE HCL 25 MG/ML IJ SOLN: 6.2500 mg | INTRAMUSCULAR | Status: DC | PRN

## 2013-04-04 MED ORDER — METOCLOPRAMIDE HCL 5 MG/ML IJ SOLN: 5.0000 mg | Freq: Three times a day (TID) | INTRAMUSCULAR | Status: DC | PRN

## 2013-04-04 MED ORDER — DEXAMETHASONE SODIUM PHOSPHATE 10 MG/ML IJ SOLN
10.0000 mg | Freq: Every day | INTRAMUSCULAR | Status: AC
  Filled 2013-04-04: qty 1

## 2013-04-04 MED ORDER — SODIUM CHLORIDE 0.9 % IR SOLN
Status: DC | PRN
  Administered 2013-04-04: 3000 mL

## 2013-04-04 MED ORDER — ADENOSINE 6 MG/2ML IV SOLN
6.0000 mg | Freq: Once | INTRAVENOUS | Status: AC
  Administered 2013-04-04: 6 mg via INTRAVENOUS
  Filled 2013-04-04: qty 2

## 2013-04-04 MED ORDER — LACTATED RINGERS IV SOLN
INTRAVENOUS | Status: DC | PRN
  Administered 2013-04-04 (×2): via INTRAVENOUS

## 2013-04-04 MED ORDER — VANCOMYCIN HCL 1000 MG IV SOLR
INTRAVENOUS | Status: AC
  Filled 2013-04-04: qty 1000

## 2013-04-04 MED ORDER — METHOCARBAMOL 100 MG/ML IJ SOLN
500.0000 mg | Freq: Four times a day (QID) | INTRAVENOUS | Status: DC | PRN
  Administered 2013-04-04: 19:00:00 500 mg via INTRAVENOUS
  Filled 2013-04-04: qty 5

## 2013-04-04 MED ORDER — HYDROMORPHONE HCL PF 1 MG/ML IJ SOLN
INTRAMUSCULAR | Status: AC
  Filled 2013-04-04: qty 1

## 2013-04-04 MED ORDER — DIPHENHYDRAMINE HCL 12.5 MG/5ML PO ELIX: 12.5000 mg | ORAL_SOLUTION | ORAL | Status: DC | PRN

## 2013-04-04 MED ORDER — DEXAMETHASONE 6 MG PO TABS
10.0000 mg | ORAL_TABLET | Freq: Every day | ORAL | Status: AC
  Administered 2013-04-05: 10:00:00 10 mg via ORAL
  Filled 2013-04-04: qty 1

## 2013-04-04 MED ORDER — ACETAMINOPHEN 500 MG PO TABS
1000.0000 mg | ORAL_TABLET | Freq: Four times a day (QID) | ORAL | Status: AC
  Administered 2013-04-04 – 2013-04-05 (×4): 1000 mg via ORAL
  Filled 2013-04-04 (×4): qty 2

## 2013-04-04 MED ORDER — ENOXAPARIN SODIUM 30 MG/0.3ML ~~LOC~~ SOLN
30.0000 mg | Freq: Two times a day (BID) | SUBCUTANEOUS | Status: DC
  Administered 2013-04-05 – 2013-04-07 (×5): 30 mg via SUBCUTANEOUS
  Filled 2013-04-04 (×8): qty 0.3

## 2013-04-04 MED ORDER — SUCCINYLCHOLINE CHLORIDE 20 MG/ML IJ SOLN
INTRAMUSCULAR | Status: DC | PRN
  Administered 2013-04-04: 100 mg via INTRAVENOUS

## 2013-04-04 MED ORDER — CEFAZOLIN SODIUM-DEXTROSE 2-3 GM-% IV SOLR
2.0000 g | Freq: Four times a day (QID) | INTRAVENOUS | Status: DC
  Filled 2013-04-04: qty 50

## 2013-04-04 MED ORDER — ONDANSETRON HCL 4 MG/2ML IJ SOLN
INTRAMUSCULAR | Status: DC | PRN
  Administered 2013-04-04: 4 mg via INTRAMUSCULAR

## 2013-04-04 MED ORDER — DOCUSATE SODIUM 100 MG PO CAPS
100.0000 mg | ORAL_CAPSULE | Freq: Two times a day (BID) | ORAL | Status: DC
  Administered 2013-04-04 – 2013-04-07 (×6): 100 mg via ORAL
  Filled 2013-04-04 (×7): qty 1

## 2013-04-04 MED ORDER — PROPOFOL 10 MG/ML IV BOLUS
INTRAVENOUS | Status: DC | PRN
  Administered 2013-04-04: 200 mg via INTRAVENOUS

## 2013-04-04 MED ORDER — WARFARIN - PHARMACIST DOSING INPATIENT: Freq: Every day | Status: DC

## 2013-04-04 MED ORDER — FLEET ENEMA 7-19 GM/118ML RE ENEM: 1.0000 | ENEMA | Freq: Once | RECTAL | Status: AC | PRN

## 2013-04-04 MED ORDER — SODIUM CHLORIDE 0.9 % IJ SOLN
INTRAMUSCULAR | Status: AC
  Filled 2013-04-04: qty 10

## 2013-04-04 MED ORDER — METOCLOPRAMIDE HCL 10 MG PO TABS: 5.0000 mg | ORAL_TABLET | Freq: Three times a day (TID) | ORAL | Status: DC | PRN

## 2013-04-04 MED ORDER — HYDROMORPHONE HCL PF 1 MG/ML IJ SOLN
0.2500 mg | INTRAMUSCULAR | Status: DC | PRN
  Administered 2013-04-04: 0.25 mg via INTRAVENOUS
  Administered 2013-04-04 (×4): 0.5 mg via INTRAVENOUS
  Administered 2013-04-04 (×2): 0.25 mg via INTRAVENOUS

## 2013-04-04 MED ORDER — METHOCARBAMOL 500 MG PO TABS
500.0000 mg | ORAL_TABLET | Freq: Four times a day (QID) | ORAL | Status: DC | PRN
  Administered 2013-04-05 – 2013-04-07 (×2): 500 mg via ORAL
  Filled 2013-04-04 (×2): qty 1

## 2013-04-04 MED ORDER — ESMOLOL HCL 10 MG/ML IV SOLN
50.0000 mg | Freq: Once | INTRAVENOUS | Status: AC
  Administered 2013-04-04: 19:00:00 50 mg via INTRAVENOUS
  Filled 2013-04-04: qty 5

## 2013-04-04 SURGICAL SUPPLY — 54 items
BAG SPEC THK2 15X12 ZIP CLS (MISCELLANEOUS) ×1
BAG ZIPLOCK 12X15 (MISCELLANEOUS) ×2 IMPLANT
BANDAGE ELASTIC 6 VELCRO ST LF (GAUZE/BANDAGES/DRESSINGS) ×2 IMPLANT
BANDAGE ESMARK 6X9 LF (GAUZE/BANDAGES/DRESSINGS) ×1 IMPLANT
BNDG CMPR 9X6 STRL LF SNTH (GAUZE/BANDAGES/DRESSINGS) ×1
BNDG ESMARK 6X9 LF (GAUZE/BANDAGES/DRESSINGS) ×2
CLOSURE STERI-STRIP 1/4X4 (GAUZE/BANDAGES/DRESSINGS) ×1 IMPLANT
CLOTH BEACON ORANGE TIMEOUT ST (SAFETY) ×2 IMPLANT
CONT SPECI 4OZ STER CLIK (MISCELLANEOUS) ×2 IMPLANT
CUFF TOURN SGL QUICK 34 (TOURNIQUET CUFF) ×2
CUFF TRNQT CYL 34X4X40X1 (TOURNIQUET CUFF) ×1 IMPLANT
DRAPE EXTREMITY T 121X128X90 (DRAPE) ×2 IMPLANT
DRAPE POUCH INSTRU U-SHP 10X18 (DRAPES) ×2 IMPLANT
DRAPE U-SHAPE 47X51 STRL (DRAPES) ×2 IMPLANT
DRESSING ADAPTIC 1/2  N-ADH (PACKING) ×1 IMPLANT
DRSG ADAPTIC 3X8 NADH LF (GAUZE/BANDAGES/DRESSINGS) ×2 IMPLANT
DURAPREP 26ML APPLICATOR (WOUND CARE) ×2 IMPLANT
ELECT REM PT RETURN 9FT ADLT (ELECTROSURGICAL) ×2
ELECTRODE REM PT RTRN 9FT ADLT (ELECTROSURGICAL) ×1 IMPLANT
FACESHIELD LNG OPTICON STERILE (SAFETY) ×10 IMPLANT
GLOVE BIO SURGEON STRL SZ7.5 (GLOVE) ×2 IMPLANT
GLOVE BIO SURGEON STRL SZ8 (GLOVE) ×2 IMPLANT
GLOVE BIOGEL PI IND STRL 8 (GLOVE) ×1 IMPLANT
GLOVE BIOGEL PI INDICATOR 8 (GLOVE) ×1
GOWN PREVENTION PLUS LG XLONG (DISPOSABLE) ×2 IMPLANT
GOWN STRL REIN XL XLG (GOWN DISPOSABLE) ×2 IMPLANT
HANDPIECE INTERPULSE COAX TIP (DISPOSABLE) ×2
IMMOBILIZER KNEE 20 (SOFTGOODS) ×2
IMMOBILIZER KNEE 20 THIGH 36 (SOFTGOODS) ×1 IMPLANT
KIT BASIN OR (CUSTOM PROCEDURE TRAY) ×2 IMPLANT
KIT STIMULAN RAPID CURE  10CC (Orthopedic Implant) ×1 IMPLANT
KIT STIMULAN RAPID CURE 10CC (Orthopedic Implant) IMPLANT
MANIFOLD NEPTUNE II (INSTRUMENTS) ×2 IMPLANT
NS IRRIG 1000ML POUR BTL (IV SOLUTION) ×2 IMPLANT
PACK TOTAL JOINT (CUSTOM PROCEDURE TRAY) ×2 IMPLANT
PAD ABD 7.5X8 STRL (GAUZE/BANDAGES/DRESSINGS) ×4 IMPLANT
PADDING CAST COTTON 6X4 STRL (CAST SUPPLIES) ×3 IMPLANT
PLATE ROT INSERT 12.5MM (Plate) ×1 IMPLANT
POSITIONER SURGICAL ARM (MISCELLANEOUS) ×2 IMPLANT
SET HNDPC FAN SPRY TIP SCT (DISPOSABLE) ×1 IMPLANT
SPONGE GAUZE 4X4 12PLY (GAUZE/BANDAGES/DRESSINGS) ×2 IMPLANT
SPONGE LAP 18X18 X RAY DECT (DISPOSABLE) ×3 IMPLANT
STAPLER VISISTAT 35W (STAPLE) ×2 IMPLANT
SUCTION FRAZIER 12FR DISP (SUCTIONS) ×2 IMPLANT
SUT MNCRL AB 4-0 PS2 18 (SUTURE) ×1 IMPLANT
SUT PDS AB 1 CT1 27 (SUTURE) ×3 IMPLANT
SUT VIC AB 2-0 CT1 27 (SUTURE) ×8
SUT VIC AB 2-0 CT1 TAPERPNT 27 (SUTURE) ×3 IMPLANT
SWAB COLLECTION DEVICE MRSA (MISCELLANEOUS) ×2 IMPLANT
TOWEL OR 17X26 10 PK STRL BLUE (TOWEL DISPOSABLE) ×4 IMPLANT
TRAY FOLEY CATH 14FRSI W/METER (CATHETERS) ×2 IMPLANT
TUBE ANAEROBIC SPECIMEN COL (MISCELLANEOUS) ×2 IMPLANT
WATER STERILE IRR 1500ML POUR (IV SOLUTION) ×2 IMPLANT
WRAP KNEE MAXI GEL POST OP (GAUZE/BANDAGES/DRESSINGS) ×4 IMPLANT

## 2013-04-04 NOTE — Progress Notes (Signed)
Called to PACU to evaluate patient for new onset rapid heart rate. Nurse noted sudden onset of suspected SVT with rate of 150-160 BPM and stable SBP. Patient denies any cardiac symptoms. EKG consistent with narrow complex SVT and patient treated with Esmolol 50mg  IV with rate diminishing to 130 bpm. Patient observed for several minutes, during which time rate continued to increase back to 145 bpm. Adenosine 6mg  IV push was given with rapid drop of heart rate to 100-110 bpm. Orthopedic service informed and cardiac consult will be obtained.

## 2013-04-04 NOTE — Brief Op Note (Signed)
03/31/2013 - 04/04/2013  5:34 PM  PATIENT:  Robert [name]  62 y.o. male  PRE-OPERATIVE DIAGNOSIS:  Left Knee hematoma  POST-OPERATIVE DIAGNOSIS:  Left knee hematoma  PROCEDURE:  Procedure(s): IRRIGATION AND DEBRIDEMENT KNEE WITH POLY EXCHANGE AND INSERTION OF CEMENT BEADS (Left)  SURGEON:  Surgeon(s) and Role:    * Loanne Drilling, MD - Primary  PHYSICIAN ASSISTANT:   ASSISTANTS: Avel Peace, PA-C   ANESTHESIA:   general  EBL:  Total I/O In: 1240 [P.O.:240; I.V.:1000] Out: 1000 [Urine:1000]  DRAINS: (Medium) Hemovact drain(s) in the left knee with  Suction Open   LOCAL MEDICATIONS USED:  NONE  COUNTS:  YES  TOURNIQUET:  28 minutes @ 300 mm Hg  DICTATION: .Other Dictation: Dictation Number [id number]  PLAN OF CARE: Admit to inpatient   PATIENT DISPOSITION:  PACU - hemodynamically stable.

## 2013-04-04 NOTE — Anesthesia Postprocedure Evaluation (Signed)
Anesthesia Post Note  Patient: Robert [name]  Procedure(s) Performed: Procedure(s) (LRB): IRRIGATION AND DEBRIDEMENT KNEE WITH POLY EXCHANGE AND INSERTION OF CEMENT BEADS (Left)  Anesthesia type: General  Patient location: PACU  Post pain: Pain level controlled  Post assessment: Post-op Vital signs reviewed  Last Vitals:  Filed Vitals:   04/04/13 1800  BP: 136/70  Pulse: 106  Temp: 36.9 C  Resp: 24    Post vital signs: Reviewed  Level of consciousness: sedated  Complications: No apparent anesthesia complications

## 2013-04-04 NOTE — ED Provider Notes (Signed)
Medical screening examination/treatment/procedure(s) were conducted as a shared visit with non-physician practitioner(s) and myself.  I personally evaluated the patient during the encounter.  Patient with painful, swollen left knee and fever. Recent knee replacement. Ultrasound negative for DVT. Antibiotics ordered. Needs orthopedic consultation and admission for further evaluation.  Raeford Razor, MD 04/04/13 [phone]

## 2013-04-04 NOTE — Progress Notes (Signed)
PACU NOTE:    HR elevated to 150-160 spontaneoulsy.  PT awake and alert. Donivan Scull CRNA called to bedside.  Dr Rica Mast called and orders received.  Adenosine 6mg  given IV by Donivan Scull  CRNA.  HR retUrned to 110  (baseline.  VSS PT awake and alert and transferred to Stepdown.  Wife remained at bedside.

## 2013-04-04 NOTE — Anesthesia Preprocedure Evaluation (Signed)
Anesthesia Evaluation  Patient identified by MRN, date of birth, ID band Patient awake    Reviewed: Allergy & Precautions, H&P , NPO status , Patient's Chart, lab work & pertinent test results  History of Anesthesia Complications (+) PONV and history of anesthetic complications  Airway Mallampati: II TM Distance: >3 FB Neck ROM: Full    Dental no notable dental hx. (+) Caps   Pulmonary asthma ,  CXR: NAD breath sounds clear to auscultation  Pulmonary exam normal       Cardiovascular + Peripheral Vascular Disease Rhythm:Regular Rate:Normal  ECG: normal  Stress ECHO 03-27-11   Neuro/Psych  Headaches, Anxiety H/O paresthesia.    GI/Hepatic negative GI ROS, Neg liver ROS,   Endo/Other  negative endocrine ROSMorbid obesity  Renal/GU Renal disease  negative genitourinary   Musculoskeletal negative musculoskeletal ROS (+)   Abdominal (+) + obese,   Peds negative pediatric ROS (+)  Hematology  (+) Blood dyscrasia, anemia ,   Anesthesia Other Findings   Reproductive/Obstetrics negative OB ROS                           Anesthesia Physical Anesthesia Plan  ASA: III  Anesthesia Plan: General   Post-op Pain Management:    Induction: Intravenous  Airway Management Planned: Oral ETT  Additional Equipment:   Intra-op Plan:   Post-operative Plan: Extubation in OR  Informed Consent:   Dental advisory given  Plan Discussed with: CRNA  Anesthesia Plan Comments:         Anesthesia Quick Evaluation

## 2013-04-04 NOTE — Progress Notes (Signed)
PACU called to update about a change in patient. Underwent Debridement and poly exchange of Left knee today by Dr Lequita Halt. Patient History positive for PE and obesity. Anticoagulated with warfarin. SVT on EKG with a rate of 150-160. No cardiac symptoms reported  Dr. Rica Mast evaluated patient and ordered Esmolol IV, HR lowered to 130. Patient was monitored closely, when HR increased to 145 BPM. Adenosine 6mg  IV given and lowered HR to 100-110. Patient was seen for rapid HR in the past, not currently on cardiac medications. Patient is unable to recall the cardiologist.  SVT: Cardiology consulted [facility]) patient was discussed and will be seen. Follow Cardiology recommendations. Will admit to Telemetry for monitoring. Will call and update cardiology with any changes in patient status.

## 2013-04-04 NOTE — Consult Note (Signed)
Reason for Consult: Tachycardia, SVT Referring Physician: Dr. Alejandro [name] is an 62 y.o. male.  HPI: Robert [name] is a 62 yo man with PMH of prior DVT/PE on warfarin currently INR 1.36 (1.99 1-2 day ago) dyslipidemia, obesity with BMI 38, chronic left knee osteoarthritis with a fairly recent TKA (end of September) now s/p I&D of left knee hematoma today (under general anesthesia) who had an SVT of 150-160s this evening around 19:45. Dr. Rica Mast was called to see the patient and gave 50 mg esmolol with HR improved to 130s. The HR returned to 140s-150s and he gave 6 mg Adenosine which lowered the HR to 100s. Throughout the tachycardia, Robert [name] remained largely asymptomatic with stable hemodynamics. Cardiology consulted for SVT and further evaluation/risk stratification. He tells me last year he could walk 3 miles but has been limited by his left knee since. He was able to walk several blocks without difficulty (outside of his knee) even several weeks to a month ago. No previous issues walking up stairs. No chest pain. No SOB. No syncope. He's asymptomatic now when he had the SVT. He does have left knee pain and some back discomfort.  No palpitations. No weight loss. No fever/chills/nausea/vomiting/diarrhea. No bleeding issues except for left knee. Stable INRs previously. Plan to restart anticoagulation in AM. (INR currently 1.36).    Past Medical History  Diagnosis Date  . Warfarin anticoagulation   . LBP (low back pain)   . History of pulmonary embolism 2003    both lungs  . Edema     Right Leg w/ h/o DVT postphlebitic  . Hyperlipidemia   . Osteoarthritis   . Obesity   . Knee pain     left  . Peripheral vascular disease     "poor circulation in  RT leg"  . Anxiety   . Spondylolisthesis   . DVT (deep venous thrombosis) 2003    right femoral artery "from groin to knee"  . Asthma     "no attack since acupuncture in 1990's"  . Wheezing     when laying on left side  . 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 Cyst of left kidney     "benign"  . Headache(784.0)     MIGRAINES-not for a long time  . Complication of anesthesia     SLOW TO WAKE UP / DIFFICULTY RESPONDING 2003, 3 days before could use left arm, "wild/crazy sometimes"  . PONV (postoperative nausea and vomiting)     during surgery in 1990's  . H/O blood transfusion reaction 2004    FFP, extreme swelling, could not breath    Past Surgical History  Procedure Laterality Date  . Lumbar fusion  2003    Dr Jillyn Hidden  . Orchiectomy  1994    R post injury  . Knee arthroscopy  1990    RT KNEE  . Knee arthroscopy Right 2003  . Knee arthroscopy  1988    L KNEE  . Finger surgery  2012    RT THUMB RECONSTRUCTION  . Knee arthroscopy with meniscal repair Left 10/22/2012    Procedure: LEFT KNEE ARTHROSCOPY PARTIAL MEDIAL AND LATERAL MENISCECTOMY AND DEBRIDEMENT, CHONDROPLASTY ;  Surgeon: Javier Docker, MD;  Location: [facility];  Service: Orthopedics;  Laterality: Left;  . Tonsillectomy  1967  . Appendectomy  1993    Dr Jamey Ripa  . Cholecystectomy  1993  . Hemorroidectomy  1981  . Hand surgery Right  abcess  . Lumbar disc surgery    . Total knee arthroplasty Left 03/21/2013    Procedure: LEFT TOTAL KNEE ARTHROPLASTY;  Surgeon: Loanne Drilling, MD;  Location: [facility];  Service: Orthopedics;  Laterality: Left;    Family History  Problem Relation Age of Onset  . Hyperlipidemia Other   . Hypertension Other   . Parkinsonism Other   . Heart disease Mother     CABG at age 31    Social History:  reports that he has never smoked. He has never used smokeless tobacco. He reports that he does not drink alcohol or use illicit drugs.  Allergies:  Allergies  Allergen Reactions  . Exalgo [Hydromorphone Hcl] Nausea Only    Sick and nauseated  . Fentanyl Other (See Comments)    REACTION: too sleepy  . Hydrocodone-Acetaminophen Nausea Only    REACTION: nausea  . Penicillins Hives  .  Sulfonamide Derivatives Hives and Swelling  . Tylenol [Acetaminophen]     Liver issues    Medications:  I have reviewed the patient's current medications. Prior to Admission:  Prescriptions prior to admission  Medication Sig Dispense Refill  . albuterol (PROVENTIL HFA;VENTOLIN HFA) 108 (90 BASE) MCG/ACT inhaler Inhale 2 puffs into the lungs every 6 (six) hours as needed for wheezing.      . cetirizine (ZYRTEC) 10 MG tablet Take 10 mg by mouth daily.       . clotrimazole-betamethasone (LOTRISONE) cream Apply 1 application topically 2 (two) times daily as needed. Apply to red spots on face      . diphenoxylate-atropine (LOMOTIL) 2.5-0.025 MG per tablet Take 1-2 tablets by mouth 4 (four) times daily as needed for diarrhea or loose stools.       . docusate sodium (COLACE) 100 MG capsule Take 100 mg by mouth 2 (two) times daily.      [name] HYDROmorphone (DILAUDID) 2 MG tablet Take 1-2 tablets (2-4 mg total) by mouth every 3 (three) hours as needed for pain.  90 tablet  0  . methocarbamol (ROBAXIN) 500 MG tablet Take 1 tablet (500 mg total) by mouth every 6 (six) hours as needed.  80 tablet  0  . nortriptyline (PAMELOR) 10 MG capsule Take 10 mg by mouth every evening.      . warfarin (COUMADIN) 6 MG tablet Take 6 mg by mouth daily.      . [DISCONTINUED] furosemide (LASIX) 20 MG tablet Take 20 mg by mouth every morning.        Scheduled: . acetaminophen  1,000 mg Oral Q6H  .  ceFAZolin (ANCEF) IV  2 g Intravenous Q6H  . clotrimazole   Topical BID  . [START ON 04/05/2013] dexamethasone  10 mg Oral Daily   Or  . [START ON 04/05/2013] dexamethasone  10 mg Intravenous Daily  . docusate sodium  100 mg Oral BID  . docusate sodium  100 mg Oral BID  . [START ON 04/05/2013] enoxaparin (LOVENOX) injection  30 mg Subcutaneous Q12H  . HYDROmorphone      . HYDROmorphone      . HYDROmorphone      . loratadine  10 mg Oral Daily  . nortriptyline  10 mg Oral QPM  . sodium chloride      . sodium chloride       . vancomycin  1,500 mg Intravenous Q12H    Results for orders placed during the hospital encounter of 03/31/13 (from the past 48 hour(s))  PROTIME-INR     Status: Abnormal  Collection Time    04/03/13  4:47 AM      Result Value Range   Prothrombin Time 18.4 (*) 11.6 - 15.2 seconds   INR 1.58 (*) 0.00 - 1.49  VANCOMYCIN, TROUGH     Status: Abnormal   Collection Time    04/03/13 11:02 PM      Result Value Range   Vancomycin Tr 8.0 (*) 10.0 - 20.0 ug/mL  PROTIME-INR     Status: Abnormal   Collection Time    04/04/13  4:45 AM      Result Value Range   Prothrombin Time 16.4 (*) 11.6 - 15.2 seconds   INR 1.36  0.00 - 1.49  CBC WITH DIFFERENTIAL     Status: Abnormal   Collection Time    04/04/13  4:45 AM      Result Value Range   WBC 10.5  4.0 - 10.5 K/uL   RBC 3.49 (*) 4.22 - 5.81 MIL/uL   Hemoglobin 10.7 (*) 13.0 - 17.0 g/dL   HCT 45.4 (*) 09.8 - 11.9 %   MCV 89.7  78.0 - 100.0 fL   MCH 30.7  26.0 - 34.0 pg   MCHC 34.2  30.0 - 36.0 g/dL   RDW 14.7  82.9 - 56.2 %   Platelets 446 (*) 150 - 400 K/uL   Neutrophils Relative % 66  43 - 77 %   Neutro Abs 7.0  1.7 - 7.7 K/uL   Lymphocytes Relative 12  12 - 46 %   Lymphs Abs 1.3  0.7 - 4.0 K/uL   Monocytes Relative 11  3 - 12 %   Monocytes Absolute 1.2 (*) 0.1 - 1.0 K/uL   Eosinophils Relative 10 (*) 0 - 5 %   Eosinophils Absolute 1.0 (*) 0.0 - 0.7 K/uL   Basophils Relative 0  0 - 1 %   Basophils Absolute 0.0  0.0 - 0.1 K/uL  SEDIMENTATION RATE     Status: Abnormal   Collection Time    04/04/13  4:45 AM      Result Value Range   Sed Rate 105 (*) 0 - 16 mm/hr  C-REACTIVE PROTEIN     Status: Abnormal   Collection Time    04/04/13  4:45 AM      Result Value Range   CRP 20.5 (*) <0.60 mg/dL   Comment: Performed at [facility]  SURGICAL PCR SCREEN     Status: None   Collection Time    04/04/13  3:06 PM      Result Value Range   MRSA, PCR NEGATIVE  NEGATIVE   Staphylococcus aureus NEGATIVE  NEGATIVE   Comment:             The Xpert SA Assay (FDA     approved for NASAL specimens     in patients over 13 years of age),     is one component of     a comprehensive surveillance     program.  Test performance has     been validated by [employer] for patients greater     than or equal to 60 year old.     It is not intended     to diagnose infection nor to     guide or monitor treatment.  MAGNESIUM     Status: None   Collection Time    04/04/13  8:20 PM      Result Value Range   Magnesium 1.7  1.5 -  2.5 mg/dL  CK TOTAL AND CKMB     Status: None   Collection Time    04/04/13  8:20 PM      Result Value Range   Total CK 90  7 - 232 U/L   CK, MB 2.2  0.3 - 4.0 ng/mL   Relative Index RELATIVE INDEX IS INVALID  0.0 - 2.5   Comment: WHEN CK < 100 U/L             TROPONIN I     Status: None   Collection Time    04/04/13  8:20 PM      Result Value Range   Troponin I <0.30  <0.30 ng/mL   Comment:            Due to the release kinetics of cTnI,     a negative result within the first hours     of the onset of symptoms does not rule out     myocardial infarction with certainty.     If myocardial infarction is still suspected,     repeat the test at appropriate intervals.    No results found.  Review of Systems  Constitutional: Positive for malaise/fatigue. Negative for fever and chills.  HENT: Negative for hearing loss.   Eyes: Negative for double vision, photophobia and pain.  Respiratory: Negative for cough, hemoptysis and sputum production.   Cardiovascular: Positive for chest pain. Negative for palpitations and orthopnea.  Gastrointestinal: Negative for heartburn, nausea, vomiting, diarrhea and blood in stool.  Genitourinary: Negative for dysuria, urgency and frequency.  Musculoskeletal: Positive for back pain and joint pain.  Skin: Negative for rash.  Neurological: Negative for dizziness, tingling, tremors and headaches.  Endo/Heme/Allergies: Negative for environmental allergies.  Bruises/bleeds easily.  Psychiatric/Behavioral: Negative for depression, suicidal ideas and substance abuse.   Blood pressure 150/52, pulse 105, temperature 98.6 F (37 C), temperature source Oral, resp. rate 18, height 5\' 11"  (1.803 m), weight 131.5 kg (289 lb 14.5 oz), SpO2 98.00%. Physical Exam  Nursing note and vitals reviewed. Constitutional: He is oriented to person, place, and time. He appears well-developed and well-nourished. No distress.  HENT:  Head: Normocephalic and atraumatic.  Nose: Nose normal.  Mouth/Throat: Oropharynx is clear and moist. No oropharyngeal exudate.  Eyes: Conjunctivae and EOM are normal. Pupils are equal, round, and reactive to light. No scleral icterus.  Neck: Normal range of motion. Neck supple. JVD present. No tracheal deviation present. No thyromegaly present.  3 cm above clavicle at 90 degrees  Cardiovascular: Regular rhythm, normal heart sounds and intact distal pulses.  Exam reveals no gallop.   No murmur heard. tachycardia  Respiratory: Effort normal and breath sounds normal. No respiratory distress. He has no wheezes. He has no rales.  GI: Soft. Bowel sounds are normal. He exhibits no distension. There is no tenderness.  Musculoskeletal: Normal range of motion. He exhibits tenderness. He exhibits no edema.  Left knee tender; left foot tender  Neurological: He is alert and oriented to person, place, and time. No cranial nerve deficit. Coordination normal.  Skin: Skin is warm and dry. No rash noted. He is not diaphoretic. No erythema.  Psychiatric: He has a normal mood and affect. His behavior is normal. Judgment and thought content normal.   Labs reviewed; h/h 10.7/31.5, plt 446, wbc 10.5 CRP 20.5, ESR 105 INR 1.36  Previous ecgs reviewed;  10/12 Stress echo with normal EF ~ 75%, nl wall motion Telemetry strips reviewed and ECG reviewed; SVT with some  retrograde p waves noted currently in sinus tachycardia  Problem  List SVT/tachycardia Post-operative I&D of Left TKA hematoma Prior DVT/PE Dyslipidemia Obesity  Assessment/Plan: Robert [name] is a 62 yo man with PMH of DVT/PE, dyslipidemia, left knee TKA/hematoma who has post-operative SVT now currently in sinus tachycardia. He has never had tachycardia or palpitations before. He is currently chest pain free. He does have some considerable left knee pain that may be driving the SVT via catecholamines. He's currently on a monitor. Differential includes structural heart disease, pain driven SVT, isolated incident or recurrent SVT such as AVNRT, AVRT or even atrial flutter or atrial fibrillation; however, I do not see strips when adenosine was given to review further. I favor watching overnight. I will start CCB should he have another event. Echocardiogram in AM given last 2 years ago, I will add on BNP. I have discussed with prior team coverage and anticoagulation to be restarted soon (in AM) with warfarin bridge.  - continue telemetry - will add on CCB, diltiazem should another SVT occur (none since PACU per primary RN) - Echocardiogram in AM - consider holter monitor at home for 48 hours - add on BNP, update TSH - treat pain adequately  - please call us if any changes in symptoms or concerning findings  Mace Weinberg 04/04/2013, 9:55 PM

## 2013-04-04 NOTE — Transfer of Care (Signed)
Immediate Anesthesia Transfer of Care Note  Patient: Robert [name]  Procedure(s) Performed: Procedure(s): IRRIGATION AND DEBRIDEMENT KNEE WITH POLY EXCHANGE AND INSERTION OF CEMENT BEADS (Left)  Patient Location: PACU  Anesthesia Type:General  Level of Consciousness: awake, sedated and patient cooperative  Airway & Oxygen Therapy: Patient Spontanous Breathing and Patient connected to face mask oxygen  Post-op Assessment: Report given to PACU RN and Post -op Vital signs reviewed and stable  Post vital signs: Reviewed and stable  Complications: No apparent anesthesia complications

## 2013-04-04 NOTE — Interval H&P Note (Signed)
History and Physical Interval Note:  04/04/2013 4:19 PM  Robert [name]  has presented today for surgery, with the diagnosis of poly exchange  The various methods of treatment have been discussed with the patient and family. After consideration of risks, benefits and other options for treatment, the patient has consented to  Procedure(s): IRRIGATION AND DEBRIDEMENT KNEE WITH POLY EXCHANGE (Left) as a surgical intervention .  The patient's history has been reviewed, patient examined, no change in status, stable for surgery.  I have reviewed the patient's chart and labs.  Questions were answered to the patient's satisfaction.     Loanne Drilling

## 2013-04-04 NOTE — Progress Notes (Signed)
ANTICOAGULATION CONSULT NOTE - Initial Consult  Pharmacy Consult for Warfarin/Lovenox Indication: Hx DVT/VTE Px  Allergies  Allergen Reactions  . Exalgo [Hydromorphone Hcl] Nausea Only    Sick and nauseated  . Fentanyl Other (See Comments)    REACTION: too sleepy  . Hydrocodone-Acetaminophen Nausea Only    REACTION: nausea  . Penicillins Hives  . Sulfonamide Derivatives Hives and Swelling  . Tylenol [Acetaminophen]     Liver issues    Patient Measurements: Height: 5\' 11"  (180.3 cm) Weight: 289 lb 14.5 oz (131.5 kg) IBW/kg (Calculated) : 75.3   Vital Signs: Temp: 98.6 F (37 C) (10/13 2045) Temp src: Oral (10/13 1413) BP: 150/52 mmHg (10/13 2135) Pulse Rate: 105 (10/13 2135)  Labs:  Recent Labs  04/02/13 0443 04/03/13 0447 04/04/13 0445 04/04/13 2020  HGB  --   --  10.7*  --   HCT  --   --  31.3*  --   PLT  --   --  446*  --   LABPROT 21.7* 18.4* 16.4*  --   INR 1.96* 1.58* 1.36  --   CKTOTAL  --   --   --  90  CKMB  --   --   --  2.2  TROPONINI  --   --   --  <0.30    Estimated Creatinine Clearance: 126.2 ml/min (by C-G formula based on Cr of 0.85).   Medical History: Past Medical History  Diagnosis Date  . Warfarin anticoagulation   . LBP (low back pain)   . History of pulmonary embolism 2003    both lungs  . Edema     Right Leg w/ h/o DVT postphlebitic  . Hyperlipidemia   . Osteoarthritis   . Obesity   . Knee pain     left  . Peripheral vascular disease     "poor circulation in  RT leg"  . Anxiety   . Spondylolisthesis   . DVT (deep venous thrombosis) 2003    right femoral artery "from groin to knee"  . Asthma     "no attack since acupuncture in 1990's"  . Wheezing     when laying on left side  . 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 Cyst of left kidney     "benign"  . Headache(784.0)     MIGRAINES-not for a long time  . Complication of anesthesia     SLOW TO WAKE UP / DIFFICULTY RESPONDING 2003, 3 days before  could use left arm, "wild/crazy sometimes"  . PONV (postoperative nausea and vomiting)     during surgery in 1990's  . H/O blood transfusion reaction 2004    FFP, extreme swelling, could not breath    Medications:  Scheduled:  . acetaminophen  1,000 mg Oral Q6H  .  ceFAZolin (ANCEF) IV  2 g Intravenous Q6H  . clotrimazole   Topical BID  . [START ON 04/05/2013] dexamethasone  10 mg Oral Daily   Or  . [START ON 04/05/2013] dexamethasone  10 mg Intravenous Daily  . docusate sodium  100 mg Oral BID  . docusate sodium  100 mg Oral BID  . [START ON 04/05/2013] enoxaparin (LOVENOX) injection  30 mg Subcutaneous Q12H  . HYDROmorphone      . HYDROmorphone      . HYDROmorphone      . loratadine  10 mg Oral Daily  . nortriptyline  10 mg Oral QPM  . sodium chloride      .  sodium chloride      . vancomycin  1,500 mg Intravenous Q12H   Infusions:  . sodium chloride     PRN: albuterol, bisacodyl, diazepam, diphenhydrAMINE, diphenhydrAMINE, diphenoxylate-atropine, HYDROmorphone (DILAUDID) injection, HYDROmorphone, menthol-cetylpyridinium, methocarbamol (ROBAXIN) IV, methocarbamol, metoCLOPramide (REGLAN) injection, metoCLOPramide, ondansetron (ZOFRAN) IV, ondansetron (ZOFRAN) IV, ondansetron, phenol, polyethylene glycol, sodium phosphate  Assessment:  62 yo s/p L TKR 9/29, patient was on warfarin post-op for DVT prophylaxis, also with hx of PE/DVT.  Admitted 10/9 with Knee pain/swelling, now s/p I&D of knee with polyexchange and insertion of cement beads on 10/13 at 1800.  Lovenox ordered 30 mg BID to start 10/14 until INR is > 1.8, also starting warfarin per pharmacy tonight.   Home dose = 6 mg daily; Last dose was 10/10  INR today trended down to 1.36  Lovenox 40 mg x 1 given on 10/12  CBC today okay  Goal of Therapy:  INR 2-3 Monitor platelets by anticoagulation protocol: Yes   Plan:  1.) Warfarin 8 mg po x 1 tonight at 2330  2.) Lovenox 30 mg BID until INR > 1.8, starting 10/14  am 3.) Daily PT/INR 4.) Given that Knee aspirate grew MSSA - Consider narrowing antibiotic coverage from vancomycin.  Will f/u in AM.    Rosalin Buster, Loma Messing PharmD Pager #: [phone] 10:29 PM 04/04/2013

## 2013-04-05 ENCOUNTER — Encounter (HOSPITAL_COMMUNITY): Payer: Self-pay | Admitting: Orthopedic Surgery

## 2013-04-05 DIAGNOSIS — I471 Supraventricular tachycardia, unspecified: Secondary | ICD-10-CM

## 2013-04-05 DIAGNOSIS — R609 Edema, unspecified: Secondary | ICD-10-CM

## 2013-04-05 DIAGNOSIS — I359 Nonrheumatic aortic valve disorder, unspecified: Secondary | ICD-10-CM

## 2013-04-05 LAB — CBC
HCT: 26.9 % — ABNORMAL LOW (ref 39.0–52.0)
Hemoglobin: 9.2 g/dL — ABNORMAL LOW (ref 13.0–17.0)
MCH: 30.8 pg (ref 26.0–34.0)
MCHC: 34.2 g/dL (ref 30.0–36.0)
MCV: 90 fL (ref 78.0–100.0)
Platelets: 368 10*3/uL (ref 150–400)
RBC: 2.99 MIL/uL — ABNORMAL LOW (ref 4.22–5.81)
RDW: 12.8 % (ref 11.5–15.5)
WBC: 9.2 10*3/uL (ref 4.0–10.5)

## 2013-04-05 LAB — BASIC METABOLIC PANEL
BUN: 9 mg/dL (ref 6–23)
CO2: 27 mEq/L (ref 19–32)
Calcium: 8.6 mg/dL (ref 8.4–10.5)
Chloride: 97 mEq/L (ref 96–112)
Creatinine, Ser: 0.71 mg/dL (ref 0.50–1.35)
GFR calc Af Amer: >90 mL/min (ref >90)
GFR calc non Af Amer: >90 mL/min (ref >90)
Glucose, Bld: 124 mg/dL — ABNORMAL HIGH (ref 70–99)
Potassium: 4 mEq/L (ref 3.5–5.1)
Sodium: 130 mEq/L — ABNORMAL LOW (ref 135–145)

## 2013-04-05 LAB — PRO B NATRIURETIC PEPTIDE: Pro B Natriuretic peptide (BNP): 357.3 pg/mL — ABNORMAL HIGH (ref 0–125)

## 2013-04-05 LAB — ANAEROBIC CULTURE

## 2013-04-05 LAB — PROTIME-INR
INR: 1.31 (ref 0.00–1.49)
Prothrombin Time: 16 seconds — ABNORMAL HIGH (ref 11.6–15.2)

## 2013-04-05 LAB — TROPONIN T

## 2013-04-05 LAB — GLUCOSE, CAPILLARY
Glucose-Capillary: 132 mg/dL — ABNORMAL HIGH (ref 70–99)
Glucose-Capillary: 231 mg/dL — ABNORMAL HIGH (ref 70–99)

## 2013-04-05 LAB — TSH: TSH: 2.619 u[IU]/mL (ref 0.350–4.500)

## 2013-04-05 MED ORDER — DILTIAZEM HCL 30 MG PO TABS
30.0000 mg | ORAL_TABLET | Freq: Three times a day (TID) | ORAL | Status: DC
  Administered 2013-04-05 – 2013-04-06 (×4): 30 mg via ORAL
  Filled 2013-04-05 (×8): qty 1

## 2013-04-05 MED ORDER — WARFARIN SODIUM 4 MG PO TABS
8.0000 mg | ORAL_TABLET | Freq: Once | ORAL | Status: AC
  Administered 2013-04-05: 8 mg via ORAL
  Filled 2013-04-05: qty 2

## 2013-04-05 MED ORDER — CEFAZOLIN SODIUM-DEXTROSE 2-3 GM-% IV SOLR
2.0000 g | Freq: Four times a day (QID) | INTRAVENOUS | Status: DC
  Administered 2013-04-05 – 2013-04-07 (×7): 2 g via INTRAVENOUS
  Filled 2013-04-05 (×10): qty 50

## 2013-04-05 MED ORDER — HYDROCORTISONE 1 % EX CREA
TOPICAL_CREAM | Freq: Three times a day (TID) | CUTANEOUS | Status: DC | PRN
  Administered 2013-04-05 – 2013-04-06 (×2): via TOPICAL
  Filled 2013-04-05: qty 28

## 2013-04-05 NOTE — Evaluation (Signed)
Physical Therapy Evaluation Patient Details Name: Robert [name] MRN: [id number] DOB: June 12, 1951 Today's Date: 04/05/2013 Time: 4098-1191 PT Time Calculation (min): 30 min  PT Assessment / Plan / Recommendation History of Present Illness  HPI:  62 y/o male now 10 days post op from L TKA presents with a 1 day h/o increased knee pain and swelling.  Pt underwent I and D of hematoma with poly exchanged 10/13 and developed transient SVT post op.  Clinical Impression  Pt with significant pain and edema in left leg, but did not have elevated HR during activity this am. He is cautious about moving leg, but anticipate he will progress in functional mobiity and d/c to home with HHPT as prior to admission    PT Assessment  Patient needs continued PT services    Follow Up Recommendations  Home health PT    Does the patient have the potential to tolerate intense rehabilitation      Barriers to Discharge        Equipment Recommendations  None recommended by PT    Recommendations for Other Services     Frequency 7X/week    Precautions / Restrictions Precautions Precautions: Knee Restrictions Weight Bearing Restrictions: Yes LLE Weight Bearing: Weight bearing as tolerated   Pertinent Vitals/Pain Pt with increased pain in LLE      Mobility  Bed Mobility Bed Mobility: Supine to Sit;Sit to Supine Supine to Sit: HOB elevated;4: Min assist Sit to Supine: 4: Min assist;HOB flat Details for Bed Mobility Assistance: needs some assist to move leg.  Wife is very hands on and involved in pt care  KI used for intial transfer for pain and anxiety control Transfers Transfers: Sit to Stand;Stand to Sit Sit to Stand: 1: +2 Total assist Sit to Stand: Patient Percentage: 60% Stand to Sit: 1: +2 Total assist Stand to Sit: Patient Percentage: 60% Details for Transfer Assistance: pt is limited by pain in leg. Ambulation/Gait Ambulation/Gait Assistance: 4: Min assist Ambulation Distance (Feet):  10 Feet Assistive device: Rolling walker Ambulation/Gait Assistance Details: assist for posture cues  Gait Pattern: Step-to pattern;Decreased step length - left;Decreased stance time - right Gait velocity: decreased General Gait Details: Pt is limited by pain, but was able to stand and take small steps to move around  to chair, then back to bed rather than sit in chair (pt concerned chair was too low and he woudl be unable to get out of it    Exercises Total Joint Exercises Ankle Circles/Pumps: AROM;Both;10 reps;Supine Quad Sets: AROM;Left;5 reps;Supine Heel Slides: AROM;Right;10 reps;Supine   PT Diagnosis: Acute pain;Difficulty walking  PT Problem List: Decreased strength;Decreased range of motion;Decreased activity tolerance;Decreased mobility;Decreased knowledge of precautions;Decreased safety awareness;Decreased knowledge of use of DME;Pain PT Treatment Interventions: DME instruction;Gait training;Stair training;Functional mobility training;Therapeutic activities;Therapeutic exercise;Patient/family education     PT Goals(Current goals can be found in the care plan section) Acute Rehab PT Goals Patient Stated Goal: to keep from having any more problems PT Goal Formulation: With patient Time For Goal Achievement: 04/19/13 Potential to Achieve Goals: Good  Visit Information  Last PT Received On: 04/05/13 Assistance Needed: +2 History of Present Illness: HPI:  62 y/o male now 10 days post op from L TKA presents with a 1 day h/o increased knee pain and swelling.  Pt underwent I and D of hematoma with poly exchanged 10/13 and developed transient SVT post op.       Prior Functioning  Home Living Family/patient expects to be discharged to:: Private residence  Living Arrangements: Spouse/significant other Available Help at Discharge: Family Type of Home: House Home Access: Stairs to enter [employer] of Steps: 4 Entrance Stairs-Rails: Right;Left Home Layout: One  level Home Equipment: Environmental consultant - 2 wheels;Adaptive equipment (needs new RW) Adaptive Equipment: Reacher;Long-handled shoe horn;Long-handled sponge Prior Function Level of Independence: Independent Communication Communication: No difficulties    Cognition  Cognition Arousal/Alertness: Awake/alert Behavior During Therapy: WFL for tasks assessed/performed Overall Cognitive Status: Within Functional Limits for tasks assessed    Extremity/Trunk Assessment Lower Extremity Assessment Lower Extremity Assessment: LLE deficits/detail LLE Deficits / Details: Pt with pain and edema in LLE: thigh to ankle.   He is able to actively move ankle dorsi/plantar flex and inversion and eversion.  He is able to quad set, but did not test knee flex/ext due to pain, edema and pt's fear of moving this soon. Cervical / Trunk Assessment Cervical / Trunk Assessment: Normal   Balance Balance Balance Assessed: Yes Static Sitting Balance Static Sitting - Balance Support: No upper extremity supported;Feet supported Static Sitting - Level of Assistance: 7: Independent Static Standing Balance Static Standing - Balance Support: Bilateral upper extremity supported;During functional activity Static Standing - Level of Assistance: 6: Modified independent (Device/Increase time)  End of Session PT - End of Session Equipment Utilized During Treatment: Left knee immobilizer Activity Tolerance: Patient tolerated treatment well Patient left: in bed;with family/visitor present;with call bell/phone within reach Nurse Communication: Mobility status  GP    Rosey Bath K. Manson Passey, [place] [phone] 04/05/2013, 10:48 AM

## 2013-04-05 NOTE — Progress Notes (Signed)
Physical Therapy Treatment Patient Details Name: Robert [name] MRN: [id number] DOB: 10-Mar-1951 Today's Date: 04/05/2013 Time: 7846-9629 PT Time Calculation (min): 27 min  PT Assessment / Plan / Recommendation  History of Present Illness pt upset this afternoon a the thought of needing a PICC line   PT Comments   Pt improved in ability to walk this afternoon  Follow Up Recommendations  Home health PT     Does the patient have the potential to tolerate intense rehabilitation     Barriers to Discharge        Equipment Recommendations       Recommendations for Other Services    Frequency 7X/week   Progress towards PT Goals Progress towards PT goals: Progressing toward goals  Plan Current plan remains appropriate    Precautions / Restrictions     Pertinent Vitals/Pain Pt c/o pain in Left knee ~7/10    Mobility       Exercises Total Joint Exercises Ankle Circles/Pumps: AROM;Both;10 reps;Supine Quad Sets: AROM;Left;5 reps;Supine Short Arc Quad: AAROM;Left;5 reps Heel Slides: 10 reps Straight Leg Raises: AAROM;Left;5 reps;Supine   PT Diagnosis:    PT Problem List:   PT Treatment Interventions:     PT Goals (current goals can now be found in the care plan section)    Visit Information  Last PT Received On: 04/05/13 History of Present Illness: pt upset this afternoon a the thought of needing a PICC line    Subjective Data      Cognition  Cognition Arousal/Alertness: Awake/alert Behavior During Therapy: WFL for tasks assessed/performed Overall Cognitive Status: Within Functional Limits for tasks assessed    Balance  Balance Balance Assessed: Yes Static Sitting Balance Static Sitting - Balance Support: No upper extremity supported;Feet supported Static Sitting - Level of Assistance: 7: Independent Static Standing Balance Static Standing - Balance Support: Bilateral upper extremity supported;During functional activity Static Standing - Level of  Assistance: 6: Modified independent (Device/Increase time)  End of Session PT - End of Session Equipment Utilized During Treatment: Left knee immobilizer Activity Tolerance: Patient tolerated treatment well Patient left: in chair;with family/visitor present;with call bell/phone within reach Nurse Communication: Mobility status   GP    Robert [name], PT [phone] Robert [name] 04/05/2013, 3:19 PM

## 2013-04-05 NOTE — Progress Notes (Signed)
Patient ID: Robert [name], male   DOB: 1950/11/12, 63 y.o.   MRN: [id number]    Subjective:  Denies SSCP, palpitations or Dyspnea Left knee pain  Objective:  Filed Vitals:   04/05/13 0445 04/05/13 0448 04/05/13 0500 04/05/13 0600  BP:  125/68 107/54 105/55  Pulse: 47 119 120 95  Temp:      TempSrc:      Resp: 21 15 18 20   Height:      Weight:      SpO2: 100% 99% 96% 100%    Intake/Output from previous day:  Intake/Output Summary (Last 24 hours) at 04/05/13 0743 Last data filed at 04/05/13 0600  Gross per 24 hour  Intake   4690 ml  Output   3175 ml  Net   1515 ml    Physical Exam: Affect appropriate Healthy:  appears stated age HEENT: normal Neck supple with no adenopathy JVP normal no bruits no thyromegaly Lungs clear with no wheezing and good diaphragmatic motion Heart:  S1/S2 no murmur, no rub, gallop or click PMI normal Abdomen: benighn, BS positve, no tenderness, no AAA no bruit.  No HSM or HJR Distal pulses intact with no bruits Left knee wrapped from surgery   Lab Results: Basic Metabolic Panel:  Recent Labs  40/98/11 2020 04/05/13 0325  NA  --  130*  K  --  4.0  CL  --  97  CO2  --  27  GLUCOSE  --  124*  BUN  --  9  CREATININE  --  0.71  CALCIUM  --  8.6  MG 1.7  --    CBC:  Recent Labs  04/04/13 0445 04/05/13 0325  WBC 10.5 9.2  NEUTROABS 7.0  --   HGB 10.7* 9.2*  HCT 31.3* 26.9*  MCV 89.7 90.0  PLT 446* 368   Cardiac Enzymes:  Recent Labs  04/04/13 2020  CKTOTAL 90  CKMB 2.2  TROPONINI <0.30    Imaging: No results found.  Cardiac Studies:  ECG:  NSR normal   Telemetry:  NSR no SVT  Echo: pending  Medications:   . acetaminophen  1,000 mg Oral Q6H  .  ceFAZolin (ANCEF) IV  2 g Intravenous Q6H  . clotrimazole   Topical BID  . dexamethasone  10 mg Oral Daily   Or  . dexamethasone  10 mg Intravenous Daily  . docusate sodium  100 mg Oral BID  . docusate sodium  100 mg Oral BID  . enoxaparin (LOVENOX)  injection  30 mg Subcutaneous Q12H  . loratadine  10 mg Oral Daily  . nortriptyline  10 mg Oral QPM  . sodium chloride      . vancomycin  1,500 mg Intravenous Q12H  . Warfarin - Pharmacist Dosing Inpatient   Does not apply q1800     . sodium chloride 100 mL (04/04/13 2241)    Assessment/Plan:  Tachycardia:  Strips not available ? SVT per fellow.  Start low dose cardizem.  Echo today Pain improved and HR / rhythm good this am Orhto:  Plan for [facility] line and 4 weeks antibiotics  PT DVT:  Resume anticoagulationper ortho  Charlton Haws 04/05/2013, 7:43 AM

## 2013-04-05 NOTE — Progress Notes (Signed)
*  PRELIMINARY RESULTS* Echocardiogram 2D Echocardiogram has been performed.  Robert [name] 04/05/2013, 9:47 AM

## 2013-04-05 NOTE — Op Note (Signed)
NAME:  Robert [name], Robert NO.:  [id number]  MEDICAL RECORD NO.:  [id number]  LOCATION:  1238                         FACILITY:  [facility]  PHYSICIAN:  Ollen Gross, M.D.    DATE OF BIRTH:  12-10-1950  DATE OF PROCEDURE:  04/04/2013 DATE OF DISCHARGE:                              OPERATIVE REPORT   PREOPERATIVE DIAGNOSIS:  Left knee postoperative hematoma.  POSTOPERATIVE DIAGNOSIS:  Left knee postoperative hematoma.  PROCEDURE:  Left knee irrigation and debridement with polyethylene exchange.  SURGEON:  Ollen Gross, MD  ASSISTANT:  Alexzandrew L. Perkins, PA-C  ANESTHESIA:  General.  ESTIMATED BLOOD LOSS:  Minimal.  DRAINS:  Hemovac x1.  TOURNIQUET TIME:  28 minutes at 300 mmHg.  COMPLICATIONS:  None.  CONDITION:  Stable to recovery.  BRIEF CLINICAL NOTE:  Robert [name] is a 62 year old male who underwent a left total knee arthroplasty 2 weeks ago.  He was doing extremely well, and then unfortunately 4 days ago, started developing significant pain and swelling in the knee.  He was on systemic anticoagulation for history of DVT and PE.  He came to the hospital Thursday night for intractable pain.  Dr. Victorino Dike performed an aspiration.  Initial results were negative at that 48 hours, rare Staph species were identified.  Due to persistent pain and due to finding on an aspirate, he presents now to the operating room for irrigation and debridement, poly exchange, and possible antibiotic needs.  PROCEDURE IN DETAIL:  After successful administration of general anesthetic, a tourniquet was placed high on his left thigh and his left lower extremity was prepped and draped in usual sterile fashion. Extremities were wrapped in Esmarch and tourniquet inflated to 300 mmHg. A midline incision was made with a 10 blade through the subcutaneous tissue to the extensor mechanism.  Fresh blade was used to make a medial arthrotomy.  The large amount of hematoma was present in the  joint.  No evidence of any purulence or infected-appearing fluid.  This fluid was sent for Gram stain culture and sensitivity.  We evacuated the joint of the hematoma.  We then were able to sublux the tibia forward and removed the tibial polyethylene which was a size five 12.5 mm thick for rotating platform Sigma need.  We then thoroughly irrigated the joint with 3 L of saline using pulsatile lavage.  I then poured about 500 mL of Betadine into the joint, let it soak into the tissues, and coated the prosthesis with it and left it there for 5 minutes.  We then irrigated again further with pulsatile lavage with saline.  I placed the new tibial polyethylene size 5 12.5 mm thick to reduce the joint.  Full extension was achieved with excellent varus-valgus, anterior-posterior balance throughout full range of motion.  We then further irrigated with the rest of the 3 L bag for a total of 6 L of saline irrigation using pulsatile lavage.  The arthrotomy was then closed over Hemovac drain with running #1 Quill suture.  Flexion against gravity to 130 degrees, patella tracks normally.  Tourniquet released total time 28 minutes. Another one of the drain was placed in the subcu tissue.  Subcu was then closed with interrupted  2-0 Vicryl.  The drains hooked to suction. Subcuticular was closed with running 4-0 Monocryl.  Incisions were cleaned and dried and Steri-Strips and a bulky sterile dressing applied. He was then awakened and transported to recovery in stable condition.  Please note that a surgical assistant was a medical necessity for this procedure in order to allow for appropriate traction placed, protection of vital ligaments and neurovascular structures.  Also important for retraction and prevent damage to the prosthesis.  Also please note that once the irrigation was completed, we had placed a total of 10 mL of vancomycin impregnated stimulin beads into the medial and lateral gutters and  suprapatellar area.  This was placed prior to closure.     Ollen Gross, M.D.     FA/MEDQ  D:  04/04/2013  T:  04/05/2013  Job:  [id number]

## 2013-04-05 NOTE — Progress Notes (Signed)
CARE MANAGEMENT NOTE 04/05/2013  Patient:  RobertRobert [name]   Account Number:  [id number]  Date Initiated:  04/01/2013  Documentation initiated by:  MANNING,LINDA  Subjective/Objective Assessment:   dx left knee pain and swelling. s/p knee replacemnt 03/21/2013     Action/Plan:   CM spoke with patient and spouse. Plans are por patient to return [place] where spouse will be caregiver. Pt is currently active with [facility] . Already has DME   Anticipated DC Date:  04/08/2013   Anticipated DC Plan:  HOME W HOME HEALTH SERVICES      DC Planning Services  CM consult      PAC Choice  HOME HEALTH   Choice offered to / List presented to:  C-1 Patient           HH agency  [facility]   Status of service:  In process, will continue to follow Medicare Important Message given?   (If response is "NO", the following Medicare IM given date fields will be blank) Date Medicare IM given:   Date Additional Medicare IM given:    Discharge Disposition:    Per UR Regulation:  Reviewed for med. necessity/level of care/duration of stay  If discussed at Long Length of Stay Meetings, dates discussed:    Comments:  [id number]/Rhonda Davis, RN, BSN, CCM [phone] Chart Reviewed for discharge and hospital needs. Discharge needs at time of review:  patient set up by floor case manager for iv abx for home, through [facility], pod 1 had run of svt post op transferred to icu for iv cardizem and now switching to po. Review of patient progress due on [id number].   04/01/2013 Linda Manning BSN RN CCM [phone] [facility] aware that patient is in hospital will follow for orders as needed for hh services.   

## 2013-04-05 NOTE — Progress Notes (Signed)
At about 0600 Pt dangled by the bedside assisted by RN, pt tolerated activity ok, HR in the 80s . About 0430 pt became tachycardic without any activity, HR in high 130s,  was asymptomatic. Sinus tach lasted about .

## 2013-04-05 NOTE — Progress Notes (Signed)
OT Cancellation Note  Patient Details Name: Robert [name] MRN: [id number] DOB: 10/06/1950   Cancelled Treatment:    Reason Eval/Treat Not Completed: Other (comment). Pt admitted for poly exchange/I & D and in icu/sdu.  Recent TKA surgery.  Will check back to see if he has OT needs.    [name] 04/05/2013, 12:56 PM Marica Otter, OTR/L [phone] 04/05/2013

## 2013-04-05 NOTE — Progress Notes (Signed)
ANTICOAGULATION CONSULT NOTE - Follow Up  Pharmacy Consult for Warfarin/Lovenox Indication: Hx DVT/VTE Px  Allergies  Allergen Reactions  . Exalgo [Hydromorphone Hcl] Nausea Only    Sick and nauseated  . Fentanyl Other (See Comments)    REACTION: too sleepy  . Hydrocodone-Acetaminophen Nausea Only    REACTION: nausea  . Penicillins Hives  . Sulfonamide Derivatives Hives and Swelling  . Tylenol [Acetaminophen]     Liver issues    Patient Measurements: Height: 5\' 11"  (180.3 cm) Weight: 289 lb 14.5 oz (131.5 kg) IBW/kg (Calculated) : 75.3   Vital Signs: Temp: 97.8 F (36.6 C) (10/14 0800) Temp src: Oral (10/14 0800) BP: 105/55 mmHg (10/14 0600) Pulse Rate: 95 (10/14 0600)  Labs:  Recent Labs  04/03/13 0447 04/04/13 0445 04/04/13 2020 04/05/13 0325  HGB  --  10.7*  --  9.2*  HCT  --  31.3*  --  26.9*  PLT  --  446*  --  368  LABPROT 18.4* 16.4*  --  16.0*  INR 1.58* 1.36  --  1.31  CREATININE  --   --   --  0.71  CKTOTAL  --   --  90  --   CKMB  --   --  2.2  --   TROPONINI  --   --  <0.30  --     Estimated Creatinine Clearance: 134.1 ml/min (by C-G formula based on Cr of 0.71).   Assessment:  62 yo s/p L TKR 9/29, patient was on warfarin post-op for DVT prophylaxis, also with hx of PE/DVT.  Admitted 10/9 with Knee pain/swelling, now s/p I&D of knee with polyexchange and insertion of cement beads on 10/13 at 1800.  Lovenox ordered 30 mg BID to start 10/14 until INR is > 1.8, warfarin restarted 10/13.  Home dose = 6 mg daily  INR 1.31  CBC okay  Goal of Therapy:  INR 2-3 Monitor platelets by anticoagulation protocol: Yes   Plan:  1.) Warfarin 8 mg po once today. 2.) Continue Lovenox 30 mg BID until INR > 1.8. 3.) Daily PT/INR  Clance Boll, PharmD, BCPS Pager: [phone]  04/05/2013 1:10 PM

## 2013-04-05 NOTE — Progress Notes (Signed)
Explained PICC procedure, risks and benefits to patient.  He "wants to think about it."  I informed patient that it will be either later tonight or tomorrow before someone else will be able to do the PICC insertion. Notified Staff RN

## 2013-04-05 NOTE — Progress Notes (Signed)
Subjective: 1 Day Post-Op Procedure(s) (LRB): IRRIGATION AND DEBRIDEMENT KNEE WITH POLY EXCHANGE AND INSERTION OF CEMENT BEADS (Left) Patient reports pain as moderate.   Patient seen in rounds with Dr. Lequita Halt. Wife in room at bedside.  Briefly discussed the operative findings. Events of last night reviewed by Dr. Lequita Halt. He spoke with Dr. Eden Emms earlier on rounds. ECHO today. Patient is having problems with pain in the knee, requiring pain medications Plan is to go Home after hospital stay. Will need PICC line for home antibiotics.  Objective: Vital signs in last 24 hours: Temp:  [98.4 F (36.9 C)-99.9 F (37.7 C)] 98.5 F (36.9 C) (10/14 0400) Pulse Rate:  [47-154] 95 (10/14 0600) Resp:  [12-27] 20 (10/14 0600) BP: (101-153)/(52-122) 105/55 mmHg (10/14 0600) SpO2:  [91 %-100 %] 100 % (10/14 0600)  Intake/Output from previous day:  Intake/Output Summary (Last 24 hours) at 04/05/13 0832 Last data filed at 04/05/13 0600  Gross per 24 hour  Intake   4450 ml  Output   3175 ml  Net   1275 ml    Intake/Output this shift:    Labs:  Recent Labs  04/04/13 0445 04/05/13 0325  HGB 10.7* 9.2*    Recent Labs  04/04/13 0445 04/05/13 0325  WBC 10.5 9.2  RBC 3.49* 2.99*  HCT 31.3* 26.9*  PLT 446* 368    Recent Labs  04/05/13 0325  NA 130*  K 4.0  CL 97  CO2 27  BUN 9  CREATININE 0.71  GLUCOSE 124*  CALCIUM 8.6    Recent Labs  04/04/13 0445 04/05/13 0325  INR 1.36 1.31    EXAM General - Patient is Alert, Appropriate and Oriented Extremity - Neurovascular intact Sensation intact distally Dorsiflexion/Plantar flexion intact Dressing - dressing C/D/I Motor Function - intact, moving foot and toes well on exam.  Hemovac drains left in place - 2 drains (one in joint and one in the sub-Q)  Past Medical History  Diagnosis Date  . Warfarin anticoagulation   . LBP (low back pain)   . History of pulmonary embolism 2003    both lungs  . Edema     Right  Leg w/ h/o DVT postphlebitic  . Hyperlipidemia   . Osteoarthritis   . Obesity   . Knee pain     left  . Peripheral vascular disease     "poor circulation in  RT leg"  . Anxiety   . Spondylolisthesis   . DVT (deep venous thrombosis) 2003    right femoral artery "from groin to knee"  . Asthma     "no attack since acupuncture in 1990's"  . Wheezing     when laying on left side  . 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 Cyst of left kidney     "benign"  . Headache(784.0)     MIGRAINES-not for a long time  . Complication of anesthesia     SLOW TO WAKE UP / DIFFICULTY RESPONDING 2003, 3 days before could use left arm, "wild/crazy sometimes"  . PONV (postoperative nausea and vomiting)     during surgery in 1990's  . H/O blood transfusion reaction 2004    FFP, extreme swelling, could not breath    Assessment/Plan: 1 Day Post-Op Procedure(s) (LRB): IRRIGATION AND DEBRIDEMENT KNEE WITH POLY EXCHANGE AND INSERTION OF CEMENT BEADS (Left) Active Problems:   Paroxysmal supraventricular tachycardia  Estimated body mass index is 40.45 kg/(m^2) as calculated from the following:  Height as of this encounter: 5\' 11"  (1.803 m).   Weight as of this encounter: 131.5 kg (289 lb 14.5 oz). Advance diet Up with therapy Discharge home with home health when improved PICC line for anitbiotics.  DVT Prophylaxis - Lovenox and Coumadin Weight-Bearing as tolerated to left leg D/C O2 and Pulse OX and try on Room Air  Daltin Crist, Marlowe Sax 04/05/2013, 8:32 AM

## 2013-04-05 NOTE — Progress Notes (Signed)
Dark red rashes noted to patient's back when up ambulating with physical therapy. Rash noted to be darker in color compared to a pink color seen earlier when noted. PA notified. Pt and wife made aware. Order given for hydrocortisone cream and benadryl. Vwilliams,rn.

## 2013-04-06 LAB — CBC
HCT: 27.4 % — ABNORMAL LOW (ref 39.0–52.0)
Hemoglobin: 9.2 g/dL — ABNORMAL LOW (ref 13.0–17.0)
MCH: 30.1 pg (ref 26.0–34.0)
MCHC: 33.6 g/dL (ref 30.0–36.0)
MCV: 89.5 fL (ref 78.0–100.0)
Platelets: 428 10*3/uL — ABNORMAL HIGH (ref 150–400)
RBC: 3.06 MIL/uL — ABNORMAL LOW (ref 4.22–5.81)
RDW: 12.7 % (ref 11.5–15.5)
WBC: 11.1 10*3/uL — ABNORMAL HIGH (ref 4.0–10.5)

## 2013-04-06 LAB — BASIC METABOLIC PANEL
BUN: 13 mg/dL (ref 6–23)
CO2: 27 mEq/L (ref 19–32)
Calcium: 9.2 mg/dL (ref 8.4–10.5)
Chloride: 97 mEq/L (ref 96–112)
Creatinine, Ser: 0.75 mg/dL (ref 0.50–1.35)
GFR calc Af Amer: >90 mL/min (ref >90)
GFR calc non Af Amer: >90 mL/min (ref >90)
Glucose, Bld: 144 mg/dL — ABNORMAL HIGH (ref 70–99)
Potassium: 4.1 mEq/L (ref 3.5–5.1)
Sodium: 133 mEq/L — ABNORMAL LOW (ref 135–145)

## 2013-04-06 LAB — PROTIME-INR
INR: 1.4 (ref 0.00–1.49)
Prothrombin Time: 16.8 seconds — ABNORMAL HIGH (ref 11.6–15.2)

## 2013-04-06 MED ORDER — WARFARIN SODIUM 4 MG PO TABS
8.0000 mg | ORAL_TABLET | Freq: Once | ORAL | Status: AC
  Administered 2013-04-06: 8 mg via ORAL
  Filled 2013-04-06: qty 2

## 2013-04-06 MED ORDER — SODIUM CHLORIDE 0.9 % IJ SOLN: 10.0000 mL | INTRAMUSCULAR | Status: DC | PRN

## 2013-04-06 MED ORDER — SODIUM CHLORIDE 0.9 % IJ SOLN
10.0000 mL | Freq: Two times a day (BID) | INTRAMUSCULAR | Status: DC
  Administered 2013-04-06: 20 mL

## 2013-04-06 MED ORDER — METOPROLOL TARTRATE 25 MG PO TABS
25.0000 mg | ORAL_TABLET | Freq: Four times a day (QID) | ORAL | Status: DC
  Administered 2013-04-06 – 2013-04-07 (×5): 25 mg via ORAL
  Filled 2013-04-06 (×8): qty 1

## 2013-04-06 MED ORDER — WARFARIN SODIUM 5 MG PO TABS
5.0000 mg | ORAL_TABLET | Freq: Once | ORAL | Status: DC
  Filled 2013-04-06: qty 1

## 2013-04-06 NOTE — Progress Notes (Signed)
Peripherally Inserted Central Catheter/Midline Placement  The IV Nurse has discussed with the patient and/or persons authorized to consent for the patient, the purpose of this procedure and the potential benefits and risks involved with this procedure.  The benefits include less needle sticks, lab draws from the catheter and patient may be discharged home with the catheter.  Risks include, but not limited to, infection, bleeding, blood clot (thrombus formation), and puncture of an artery; nerve damage and irregular heat beat.  Alternatives to this procedure were also discussed.  PICC/Midline Placement Documentation        Robert [name] 04/06/2013, 2:41 PM

## 2013-04-06 NOTE — Progress Notes (Signed)
Patient has small burst of a-fib at 1751 that lasted 5 minutes. Heart rate increased to the one teens. Did EKG per protocol and it states accelerated junctional. V1 shows elongated PR interval; 1st degree block. Cardiologist on call called at 1835.

## 2013-04-06 NOTE — Progress Notes (Signed)
Patient ID: Robert [name], male   DOB: 1951-03-12, 62 y.o.   MRN: [id number]    Subjective:  Tough morning lots of pain in knee and contipation  Objective:  Filed Vitals:   04/05/13 2051 04/05/13 2327 04/05/13 2328 04/06/13 0333  BP: 129/54 128/52  128/63  Pulse: 94 88  82  Temp: 98.2 F (36.8 C) 98.1 F (36.7 C)  97.9 F (36.6 C)  TempSrc: Oral Oral  Oral  Resp: 23 22  17   Height:      Weight:      SpO2: 94% 89% 91% 94%    Intake/Output from previous day:  Intake/Output Summary (Last 24 hours) at 04/06/13 0915 Last data filed at 04/06/13 0700  Gross per 24 hour  Intake    630 ml  Output   2290 ml  Net  -1660 ml    Physical Exam: Affect appropriate Obese white male  HEENT: normal Neck supple with no adenopathy JVP normal no bruits no thyromegaly Lungs clear with no wheezing and good diaphragmatic motion Heart:  S1/S2 no murmur, no rub, gallop or click PMI normal Abdomen: benighn, BS positve, no tenderness, no AAA no bruit.  No HSM or HJR Distal pulses intact with no bruits Left knee wrapped from surgery   Lab Results: Basic Metabolic Panel:  Recent Labs  40/98/11 2020 04/05/13 0325 04/06/13 0328  NA  --  130* 133*  K  --  4.0 4.1  CL  --  97 97  CO2  --  27 27  GLUCOSE  --  124* 144*  BUN  --  9 13  CREATININE  --  0.71 0.75  CALCIUM  --  8.6 9.2  MG 1.7  --   --    CBC:  Recent Labs  04/04/13 0445 04/05/13 0325 04/06/13 0328  WBC 10.5 9.2 11.1*  NEUTROABS 7.0  --   --   HGB 10.7* 9.2* 9.2*  HCT 31.3* 26.9* 27.4*  MCV 89.7 90.0 89.5  PLT 446* 368 428*   Cardiac Enzymes:  Recent Labs  04/04/13 2020  CKTOTAL 90  CKMB 2.2  TROPONINI <0.30    Imaging: No results found.  Cardiac Studies:  ECG:  NSR normal   Telemetry:  NSR no SVT  Echo: pending  Medications:   .  ceFAZolin (ANCEF) IV  2 g Intravenous Q6H  . clotrimazole   Topical BID  . docusate sodium  100 mg Oral BID  . enoxaparin (LOVENOX) injection  30 mg  Subcutaneous Q12H  . loratadine  10 mg Oral Daily  . metoprolol tartrate  25 mg Oral Q6H  . nortriptyline  10 mg Oral QPM  . warfarin  8 mg Oral ONCE-1800  . Warfarin - Pharmacist Dosing Inpatient   Does not apply q1800     . sodium chloride 30 mL/hr at 04/05/13 9147    Assessment/Plan:  Tachycardia:  Strips not available ? SVT per fellow.  Watched him this am and increased HR clearly adrenergically driven with obesity and pain and any activity.  Will change to lopressor q 6 hours  Orhto:  Plan for [facility] line and 4 weeks antibiotics  Patient does not want PIC ? Can he get peripheral iv changed weekly at home DVT:  Resume anticoagulationper ortho/pharmacy  Charlton Haws 04/06/2013, 9:15 AM

## 2013-04-06 NOTE — Progress Notes (Signed)
ANTICOAGULATION CONSULT NOTE - Follow Up  Pharmacy Consult for Warfarin/Lovenox Indication: Hx DVT/VTE Px  Allergies  Allergen Reactions  . Exalgo [Hydromorphone Hcl] Nausea Only    Sick and nauseated  . Fentanyl Other (See Comments)    REACTION: too sleepy  . Hydrocodone-Acetaminophen Nausea Only    REACTION: nausea  . Penicillins Hives  . Sulfonamide Derivatives Hives and Swelling  . Tylenol [Acetaminophen]     Liver issues    Patient Measurements: Height: 5\' 11"  (180.3 cm) Weight: 289 lb 14.5 oz (131.5 kg) IBW/kg (Calculated) : 75.3   Vital Signs: Temp: 97.9 F (36.6 C) (10/15 0333) Temp src: Oral (10/15 0333) BP: 128/63 mmHg (10/15 0333) Pulse Rate: 82 (10/15 0333)  Labs:  Recent Labs  04/04/13 0445 04/04/13 2020 04/05/13 0325 04/06/13 0328  HGB 10.7*  --  9.2* 9.2*  HCT 31.3*  --  26.9* 27.4*  PLT 446*  --  368 428*  LABPROT 16.4*  --  16.0* 16.8*  INR 1.36  --  1.31 1.40  CREATININE  --   --  0.71 0.75  CKTOTAL  --  90  --   --   CKMB  --  2.2  --   --   TROPONINI  --  <0.30  --   --     Estimated Creatinine Clearance: 134.1 ml/min (by C-G formula based on Cr of 0.75).   Assessment:  62 yo s/p L TKR 9/29, patient was on warfarin post-op for DVT prophylaxis, also with hx of PE/DVT.  Admitted 10/9 with Knee pain/swelling, now s/p I&D of knee with polyexchange and insertion of cement beads on 10/13 at 1800.  Lovenox ordered 30 mg BID to start 10/14 until INR is > 1.8, warfarin restarted 10/13.  Home dose = 6 mg daily  INR 1.40 after two days of 8 mg doses  CBC okay  Minimal PO intake  Goal of Therapy:  INR 2-3 Monitor platelets by anticoagulation protocol: Yes   Plan:  1.) Repeat warfarin 8 mg po once today. 2.) Continue Lovenox 30 mg BID until INR > 1.8. 3.) Daily PT/INR  Clance Boll, PharmD, BCPS Pager: [phone]  04/06/2013 8:56 AM

## 2013-04-06 NOTE — Progress Notes (Signed)
OT Cancellation Note  Patient Details Name: Robert [name] MRN: [id number] DOB: December 31, 1950   Cancelled Treatment:    Reason Eval/Treat Not Completed: PT screened, no needs identified, will sign off  Jasper Ruminski 04/06/2013, 3:49 PM Marica Otter, OTR/L [phone] 04/06/2013

## 2013-04-06 NOTE — Progress Notes (Signed)
Physical Therapy Treatment Patient Details Name: ADHVIK CANADY MRN: [id number] DOB: 23-Apr-1951 Today's Date: 04/06/2013 Time: 4098-1191 PT Time Calculation (min): 37 min  PT Assessment / Plan / Recommendation  History of Present Illness S/P I/D recent TKA/polyexchange.   PT Comments   Pt tolerating ambulation well. Takes extra time. Gentle exercises to L knee assisted.   Follow Up Recommendations  Home health PT     Does the patient have the potential to tolerate intense rehabilitation     Barriers to Discharge        Equipment Recommendations  None recommended by PT    Recommendations for Other Services    Frequency 7X/week   Progress towards PT Goals Progress towards PT goals: Progressing toward goals  Plan Current plan remains appropriate    Precautions / Restrictions Precautions Precautions: Knee Required Braces or Orthoses: Knee Immobilizer - Left Knee Immobilizer - Left: Discontinue once straight leg raise with < 10 degree lag Restrictions LLE Weight Bearing: Weight bearing as tolerated   Pertinent Vitals/Pain 5 L knee when lifting leg.ice applied and had been premedicated.    Mobility  Bed Mobility Supine to Sit: HOB elevated;4: Min assist Sit to Supine: 4: Min assist Details for Bed Mobility Assistance: Pt's wife assisting LLE onto/off bed. Transfers Sit to Stand: 4: Min guard;From bed Stand to Sit: To bed;4: Min guard Details for Transfer Assistance: pt able to place LLE out prior to sitting down. Ambulation/Gait Ambulation/Gait Assistance: 4: Min guard Ambulation Distance (Feet): 75 Feet Assistive device: Rolling walker Ambulation/Gait Assistance Details: extra time required for pacing self, extra time for pt explaining what has happened. Gait Pattern: Step-to pattern;Decreased step length - left;Decreased stance time - right Gait velocity: decreased General Gait Details: Pt needs extra time, but is able to perform walking sequence to protect LLE.   Pt appeared to tolerate increased weight bearing and sequncing is improved. VC for safe use of RW and posture.    Exercises Total Joint Exercises Ankle Circles/Pumps: AROM;Both;10 reps;Supine Quad Sets: AROM;Left;5 reps;Supine Short Arc Quad: AAROM;Left;5 reps Hip ABduction/ADduction: AAROM;Left;5 reps Straight Leg Raises: AAROM;Left;5 reps;Supine   PT Diagnosis:    PT Problem List:   PT Treatment Interventions:     PT Goals (current goals can now be found in the care plan section)    Visit Information  Last PT Received On: 04/06/13 Assistance Needed: +1 History of Present Illness: S/P I/D recent TKA/polyexchange.    Subjective Data      Cognition  Cognition Arousal/Alertness: Awake/alert    Balance     End of Session PT - End of Session Equipment Utilized During Treatment: Left knee immobilizer Activity Tolerance: Patient tolerated treatment well Patient left: with family/visitor present;with call bell/phone within reach;in bed Nurse Communication: Mobility status   GP     Rada Hay 04/06/2013, 4:30 PM Blanchard Kelch PT [phone]

## 2013-04-07 LAB — CULTURE, BLOOD (ROUTINE X 2)
Culture: NO GROWTH
Culture: NO GROWTH

## 2013-04-07 LAB — CBC
HCT: 27.5 % — ABNORMAL LOW (ref 39.0–52.0)
Hemoglobin: 9.1 g/dL — ABNORMAL LOW (ref 13.0–17.0)
MCH: 30.2 pg (ref 26.0–34.0)
MCHC: 33.1 g/dL (ref 30.0–36.0)
MCV: 91.4 fL (ref 78.0–100.0)
Platelets: 518 10*3/uL — ABNORMAL HIGH (ref 150–400)
RBC: 3.01 MIL/uL — ABNORMAL LOW (ref 4.22–5.81)
RDW: 13.3 % (ref 11.5–15.5)
WBC: 11.6 10*3/uL — ABNORMAL HIGH (ref 4.0–10.5)

## 2013-04-07 LAB — PROTIME-INR
INR: 1.61 — ABNORMAL HIGH (ref 0.00–1.49)
Prothrombin Time: 18.7 seconds — ABNORMAL HIGH (ref 11.6–15.2)

## 2013-04-07 LAB — WOUND CULTURE: Gram Stain: NONE SEEN

## 2013-04-07 MED ORDER — HYDROCORTISONE 1 % EX CREA: TOPICAL_CREAM | Freq: Three times a day (TID) | CUTANEOUS | Status: DC | PRN

## 2013-04-07 MED ORDER — METHOCARBAMOL 500 MG PO TABS: 500.0000 mg | ORAL_TABLET | Freq: Four times a day (QID) | ORAL | Status: DC | PRN

## 2013-04-07 MED ORDER — CEFAZOLIN SODIUM-DEXTROSE 2-3 GM-% IV SOLR: 2.0000 g | Freq: Four times a day (QID) | INTRAVENOUS | Status: DC

## 2013-04-07 MED ORDER — DIAZEPAM 5 MG PO TABS: 5.0000 mg | ORAL_TABLET | Freq: Four times a day (QID) | ORAL | Status: DC | PRN

## 2013-04-07 MED ORDER — ENOXAPARIN SODIUM 30 MG/0.3ML ~~LOC~~ SOLN: 30.0000 mg | Freq: Two times a day (BID) | SUBCUTANEOUS | Status: DC

## 2013-04-07 MED ORDER — HYDROMORPHONE HCL 2 MG PO TABS: 2.0000 mg | ORAL_TABLET | ORAL | Status: DC | PRN

## 2013-04-07 MED ORDER — METOPROLOL TARTRATE 50 MG PO TABS: 50.0000 mg | ORAL_TABLET | Freq: Two times a day (BID) | ORAL | Status: DC

## 2013-04-07 NOTE — Progress Notes (Signed)
Patient ID: Robert [name], male   DOB: [birth year], 62 y.o.   MRN: [id number]    Subjective:  Much better NO constipation No pain PIC line in   Objective:  Filed Vitals:   04/07/13 0500 04/07/13 0600 04/07/13 0700 04/07/13 0756  BP:    123/68  Pulse:      Temp:      TempSrc:      Resp: 19 17 19 21   Height:      Weight:      SpO2:        Intake/Output from previous day:  Intake/Output Summary (Last 24 hours) at 04/07/13 3329 Last data filed at 04/07/13 0700  Gross per 24 hour  Intake    550 ml  Output   3201 ml  Net  -2651 ml    Physical Exam: Affect appropriate Obese white male  HEENT: normal Neck supple with no adenopathy JVP normal no bruits no thyromegaly Lungs clear with no wheezing and good diaphragmatic motion Heart:  S1/S2 no murmur, no rub, gallop or click PMI normal Abdomen: benighn, BS positve, no tenderness, no AAA no bruit.  No HSM or HJR Distal pulses intact with no bruits Left knee wrapped from surgery RUE PIC line    Lab Results: Basic Metabolic Panel:  Recent Labs  51/88/41 2020 04/05/13 0325 04/06/13 0328  NA  --  130* 133*  K  --  4.0 4.1  CL  --  97 97  CO2  --  27 27  GLUCOSE  --  124* 144*  BUN  --  9 13  CREATININE  --  0.71 0.75  CALCIUM  --  8.6 9.2  MG 1.7  --   --    CBC:  Recent Labs  04/06/13 0328 04/07/13 0530  WBC 11.1* 11.6*  HGB 9.2* 9.1*  HCT 27.4* 27.5*  MCV 89.5 91.4  PLT 428* 518*   Cardiac Enzymes:  Recent Labs  04/04/13 2020  CKTOTAL 90  CKMB 2.2  TROPONINI <0.30    Imaging: No results found.  Cardiac Studies:  ECG:  NSR normal   Telemetry:  NSR no SVT  Echo: pending  Medications:   .  ceFAZolin (ANCEF) IV  2 g Intravenous Q6H  . clotrimazole   Topical BID  . docusate sodium  100 mg Oral BID  . enoxaparin (LOVENOX) injection  30 mg Subcutaneous Q12H  . loratadine  10 mg Oral Daily  . metoprolol tartrate  25 mg Oral Q6H  . nortriptyline  10 mg Oral QPM  . sodium chloride   10-40 mL Intracatheter Q12H  . Warfarin - Pharmacist Dosing Inpatient   Does not apply q1800     . sodium chloride 30 mL/hr at 04/05/13 6606    Assessment/Plan:  Tachycardia:  Improved d/c home with lopressor 50 bid Orhto:  Plan for [facility] line and 4 weeks antibiotics    DVT:  Resume anticoagulationper ortho/pharmacy INR 1.61 ? If he goes home today doe he need lovenox for a day or two ?  Charlton Haws 04/07/2013, 8:21 AM

## 2013-04-07 NOTE — Progress Notes (Signed)
Subjective: 3 Days Post-Op Procedure(s) (LRB): IRRIGATION AND DEBRIDEMENT KNEE WITH POLY EXCHANGE AND INSERTION OF CEMENT BEADS (Left) Patient reports pain as mild and moderate.   Patient seen in rounds with Dr. Lequita Halt.  Wife in room at bedside. Patient is well, but has had some minor complaints of pain in the knee, requiring pain medications Patient is ready to go home today if gets all the antibiotics arranged for home. Will need ANCEF for a total of 28 days via PICC line.  Objective: Vital signs in last 24 hours: Temp:  [97.6 F (36.4 C)-98.5 F (36.9 C)] 98.4 F (36.9 C) (10/16 0756) Pulse Rate:  [81-83] 81 (10/15 1623) Resp:  [15-25] 15 (10/16 0800) BP: (121-132)/(57-79) 123/68 mmHg (10/16 0756)  Intake/Output from previous day:  Intake/Output Summary (Last 24 hours) at 04/07/13 0909 Last data filed at 04/07/13 0700  Gross per 24 hour  Intake    550 ml  Output   3201 ml  Net  -2651 ml    Intake/Output this shift:    Labs:  Recent Labs  04/05/13 0325 04/06/13 0328 04/07/13 0530  HGB 9.2* 9.2* 9.1*    Recent Labs  04/06/13 0328 04/07/13 0530  WBC 11.1* 11.6*  RBC 3.06* 3.01*  HCT 27.4* 27.5*  PLT 428* 518*    Recent Labs  04/05/13 0325 04/06/13 0328  NA 130* 133*  K 4.0 4.1  CL 97 97  CO2 27 27  BUN 9 13  CREATININE 0.71 0.75  GLUCOSE 124* 144*  CALCIUM 8.6 9.2    Recent Labs  04/06/13 0328 04/07/13 0530  INR 1.40 1.61*    EXAM: General - Patient is Alert, Appropriate and Oriented Extremity - Neurovascular intact Sensation intact distally Dorsiflexion/Plantar flexion intact Incision - clean, dry, no drainage, healing Motor Function - intact, moving foot and toes well on exam.   Assessment/Plan: 3 Days Post-Op Procedure(s) (LRB): IRRIGATION AND DEBRIDEMENT KNEE WITH POLY EXCHANGE AND INSERTION OF CEMENT BEADS (Left) Procedure(s) (LRB): IRRIGATION AND DEBRIDEMENT KNEE WITH POLY EXCHANGE AND INSERTION OF CEMENT BEADS  (Left) Past Medical History  Diagnosis Date  . Warfarin anticoagulation   . LBP (low back pain)   . History of pulmonary embolism 2003    both lungs  . Edema     Right Leg w/ h/o DVT postphlebitic  . Hyperlipidemia   . Osteoarthritis   . Obesity   . Knee pain     left  . Peripheral vascular disease     "poor circulation in  RT leg"  . Anxiety   . Spondylolisthesis   . DVT (deep venous thrombosis) 2003    right femoral artery "from groin to knee"  . Asthma     "no attack since acupuncture in 1990's"  . Wheezing     when laying on left side  . 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 Cyst of left kidney     "benign"  . Headache(784.0)     MIGRAINES-not for a long time  . Complication of anesthesia     SLOW TO WAKE UP / DIFFICULTY RESPONDING 2003, 3 days before could use left arm, "wild/crazy sometimes"  . PONV (postoperative nausea and vomiting)     during surgery in 1990's  . H/O blood transfusion reaction 2004    FFP, extreme swelling, could not breath   Active Problems:   Paroxysmal supraventricular tachycardia  Estimated body mass index is 40.45 kg/(m^2) as calculated from the following:  Height as of this encounter: 5\' 11"  (1.803 m).   Weight as of this encounter: 131.5 kg (289 lb 14.5 oz). Up with therapy Discharge home with home health Diet - Cardiac diet Follow up - in 1 week, Thursday or Friday next week. Activity - WBAT Disposition - Home Condition Upon Discharge - Pending upon arrangements for home.   D/C Meds - See DC Summary DVT Prophylaxis - Lovenox and Coumadin  Heavan Francom 04/07/2013, 9:09 AM

## 2013-04-07 NOTE — Progress Notes (Signed)
Physical Therapy Treatment Patient Details Name: Robert [name] MRN: [id number] DOB: 1950-09-07 Today's Date: 04/07/2013 Time: 4098-1191 PT Time Calculation (min): 27 min  PT Assessment / Plan / Recommendation  History of Present Illness S/P I/D recent TKA/polyexchange.   PT Comments   Pt . Progressing well. Does experience increased pani/tightness with attempts to flex knee. Pt plans DC today.  Follow Up Recommendations        Does the patient have the potential to tolerate intense rehabilitation     Barriers to Discharge        Equipment Recommendations       Recommendations for Other Services    Frequency     Progress towards PT Goals Progress towards PT goals: Progressing toward goals  Plan      Precautions / Restrictions Precautions Precautions: Knee;Fall Restrictions Weight Bearing Restrictions: Yes LLE Weight Bearing: Weight bearing as tolerated   Pertinent Vitals/Pain Mild dyspnea with activity.   Mobility  Bed Mobility Sit to Supine: 4: Min assist Details for Bed Mobility Assistance: Pt's wife assisting LLE onto/off bed. Transfers Stand to Sit: To bed;5: Supervision Details for Transfer Assistance: pt able to place LLE out prior to sitting down. Ambulation/Gait Ambulation/Gait Assistance: 5: Supervision Ambulation Distance (Feet): 125 Feet Ambulation/Gait Assistance Details: extra time required as pt fatigued after having bath. pt not wearing ki today. Gait Pattern: Step-to pattern;Decreased step length - left;Decreased stance time - right Gait velocity: decreased General Gait Details: Pt needs extra time, but is able to perform walking sequence to protect LLE.  Pt appeared to tolerate increased weight bearing and sequncing is improved. VC for safe use of RW and posture.    Exercises Total Joint Exercises Ankle Circles/Pumps: AROM;Both;10 reps;Supine Quad Sets: AROM;Left;5 reps;Supine Heel Slides: 10 reps;AAROM Hip ABduction/ADduction: AAROM;Left;5  reps Straight Leg Raises: AAROM;Left;5 reps;Supine   PT Diagnosis:    PT Problem List:   PT Treatment Interventions:     PT Goals (current goals can now be found in the care plan section)    Visit Information  Last PT Received On: 04/07/13 Assistance Needed: +1 History of Present Illness: S/P I/D recent TKA/polyexchange.    Subjective Data      Cognition  Cognition Arousal/Alertness: Awake/alert    Balance     End of Session PT - End of Session Activity Tolerance: Patient tolerated treatment well Patient left: in bed;with call bell/phone within reach;with family/visitor present Nurse Communication: Mobility status   GP     Rada Hay 04/07/2013, 11:37 AM

## 2013-04-07 NOTE — Discharge Summary (Signed)
Physician Discharge Summary   Patient ID: Robert [name] MRN: [id number] DOB/AGE: 1951/03/11 62 y.o.  Admit date: 03/31/2013 Discharge date: 04/07/2013  Primary Diagnosis:  L knee effusion with fever and elevated wbc   Admission Diagnoses:  Past Medical History  Diagnosis Date  . Warfarin anticoagulation   . LBP (low back pain)   . History of pulmonary embolism 2003    both lungs  . Edema     Right Leg w/ h/o DVT postphlebitic  . Hyperlipidemia   . Osteoarthritis   . Obesity   . Knee pain     left  . Peripheral vascular disease     "poor circulation in  RT leg"  . Anxiety   . Spondylolisthesis   . DVT (deep venous thrombosis) 2003    right femoral artery "from groin to knee"  . Asthma     "no attack since acupuncture in 1990's"  . Wheezing     when laying on left side  . 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 Cyst of left kidney     "benign"  . Headache(784.0)     MIGRAINES-not for a long time  . Complication of anesthesia     SLOW TO WAKE UP / DIFFICULTY RESPONDING 2003, 3 days before could use left arm, "wild/crazy sometimes"  . PONV (postoperative nausea and vomiting)     during surgery in 1990's  . H/O blood transfusion reaction 2004    FFP, extreme swelling, could not breath   Discharge Diagnoses:   Active Problems:   Paroxysmal supraventricular tachycardia  Estimated body mass index is 40.45 kg/(m^2) as calculated from the following:   Height as of this encounter: 5\' 11"  (1.803 m).   Weight as of this encounter: 131.5 kg (289 lb 14.5 oz).  Procedure:  Procedure(s) (LRB): IRRIGATION AND DEBRIDEMENT KNEE WITH POLY EXCHANGE AND INSERTION OF CEMENT BEADS (Left)   Consults: cardiology  HPI: Robert [name] is a 62 year old male who underwent a left  total knee arthroplasty 2 weeks ago. He was doing extremely well, and  then unfortunately 4 days ago, started developing significant pain and  swelling in the knee. He was on systemic  anticoagulation for history of  DVT and PE. He came to the hospital Thursday night for intractable  pain. Dr. Victorino Dike performed an aspiration. Initial results were  negative at that 48 hours, rare Staph species were identified. Due to  persistent pain and due to finding on an aspirate, he presents now to  the operating room for irrigation and debridement, poly exchange, and  possible antibiotic needs.  Laboratory Data: Admission on 03/31/2013  No results displayed because visit has over 200 results.    Admission on 03/21/2013, Discharged on 03/23/2013  Component Date Value Range Status  . Prothrombin Time 03/21/2013 14.4  11.6 - 15.2 seconds Final  . INR 03/21/2013 1.14  0.00 - 1.49 Final  . WBC 03/22/2013 16.6* 4.0 - 10.5 K/uL Final  . RBC 03/22/2013 4.11* 4.22 - 5.81 MIL/uL Final  . Hemoglobin 03/22/2013 12.9* 13.0 - 17.0 g/dL Final  . HCT 14/78/2956 37.1* 39.0 - 52.0 % Final  . MCV 03/22/2013 90.3  78.0 - 100.0 fL Final  . MCH 03/22/2013 31.4  26.0 - 34.0 pg Final  . MCHC 03/22/2013 34.8  30.0 - 36.0 g/dL Final  . RDW 21/30/8657 12.6  11.5 - 15.5 % Final  . Platelets 03/22/2013 263  150 - 400 K/uL Final  . Sodium  03/22/2013 132* 135 - 145 mEq/L Final  . Potassium 03/22/2013 4.2  3.5 - 5.1 mEq/L Final  . Chloride 03/22/2013 98  96 - 112 mEq/L Final  . CO2 03/22/2013 25  19 - 32 mEq/L Final  . Glucose, Bld 03/22/2013 150* 70 - 99 mg/dL Final  . BUN 40/98/1191 8  6 - 23 mg/dL Final  . Creatinine, Ser 03/22/2013 0.80  0.50 - 1.35 mg/dL Final  . Calcium 47/82/9562 8.5  8.4 - 10.5 mg/dL Final  . GFR calc non Af Amer 03/22/2013 >90  >90 mL/min Final  . GFR calc Af Amer 03/22/2013 >90  >90 mL/min Final   Comment: (NOTE)                          The eGFR has been calculated using the CKD EPI equation.                          This calculation has not been validated in all clinical situations.                          eGFR's persistently <90 mL/min signify possible Chronic Kidney                           Disease.  [name] Prothrombin Time 03/22/2013 14.6  11.6 - 15.2 seconds Final  . INR 03/22/2013 1.16  0.00 - 1.49 Final  . WBC 03/23/2013 19.0* 4.0 - 10.5 K/uL Final  . RBC 03/23/2013 3.94* 4.22 - 5.81 MIL/uL Final  . Hemoglobin 03/23/2013 12.6* 13.0 - 17.0 g/dL Final  . HCT 13/01/6577 35.7* 39.0 - 52.0 % Final  . MCV 03/23/2013 90.6  78.0 - 100.0 fL Final  . MCH 03/23/2013 32.0  26.0 - 34.0 pg Final  . MCHC 03/23/2013 35.3  30.0 - 36.0 g/dL Final  . RDW 46/96/2952 12.7  11.5 - 15.5 % Final  . Platelets 03/23/2013 266  150 - 400 K/uL Final  . Sodium 03/23/2013 137  135 - 145 mEq/L Final  . Potassium 03/23/2013 3.8  3.5 - 5.1 mEq/L Final  . Chloride 03/23/2013 101  96 - 112 mEq/L Final  . CO2 03/23/2013 29  19 - 32 mEq/L Final  . Glucose, Bld 03/23/2013 142* 70 - 99 mg/dL Final  . BUN 84/13/2440 9  6 - 23 mg/dL Final  . Creatinine, Ser 03/23/2013 0.74  0.50 - 1.35 mg/dL Final  . Calcium 04/19/2535 8.9  8.4 - 10.5 mg/dL Final  . GFR calc non Af Amer 03/23/2013 >90  >90 mL/min Final  . GFR calc Af Amer 03/23/2013 >90  >90 mL/min Final   Comment: (NOTE)                          The eGFR has been calculated using the CKD EPI equation.                          This calculation has not been validated in all clinical situations.                          eGFR's persistently <90 mL/min signify possible Chronic Kidney  Disease.  [name] Prothrombin Time 03/23/2013 15.7* 11.6 - 15.2 seconds Final  . INR 03/23/2013 1.28  0.00 - 1.49 Final  Hospital Outpatient Visit on 03/14/2013  Component Date Value Range Status  . MRSA, PCR 03/14/2013 NEGATIVE  NEGATIVE Final  . Staphylococcus aureus 03/14/2013 POSITIVE* NEGATIVE Final   Comment:                                 The Xpert SA Assay (FDA                          approved for NASAL specimens                          in patients over 61 years of age),                          is one component of                           a comprehensive surveillance                          program.  Test performance has                          been validated by [employer] for patients greater                          than or equal to 50 year old.                          It is not intended                          to diagnose infection nor to                          guide or monitor treatment.  [name] aPTT 03/14/2013 39* 24 - 37 seconds Final   Comment:                                 IF BASELINE aPTT IS ELEVATED,                          SUGGEST PATIENT RISK ASSESSMENT                          BE USED TO DETERMINE APPROPRIATE                          ANTICOAGULANT THERAPY.  . WBC 03/14/2013 8.6  4.0 - 10.5 K/uL Final  . RBC 03/14/2013 4.82  4.22 - 5.81 MIL/uL Final  . Hemoglobin 03/14/2013 15.2  13.0 - 17.0 g/dL Final  . HCT 16/03/9603 44.2  39.0 - 52.0 % Final  .  MCV 03/14/2013 91.7  78.0 - 100.0 fL Final  . MCH 03/14/2013 31.5  26.0 - 34.0 pg Final  . MCHC 03/14/2013 34.4  30.0 - 36.0 g/dL Final  . RDW 16/03/9603 12.9  11.5 - 15.5 % Final  . Platelets 03/14/2013 291  150 - 400 K/uL Final  . Sodium 03/14/2013 135  135 - 145 mEq/L Final  . Potassium 03/14/2013 4.4  3.5 - 5.1 mEq/L Final  . Chloride 03/14/2013 97  96 - 112 mEq/L Final  . CO2 03/14/2013 29  19 - 32 mEq/L Final  . Glucose, Bld 03/14/2013 98  70 - 99 mg/dL Final  . BUN 54/02/8118 15  6 - 23 mg/dL Final  . Creatinine, Ser 03/14/2013 0.95  0.50 - 1.35 mg/dL Final  . Calcium 14/78/2956 9.4  8.4 - 10.5 mg/dL Final  . Total Protein 03/14/2013 7.9  6.0 - 8.3 g/dL Final  . Albumin 21/30/8657 3.7  3.5 - 5.2 g/dL Final  . AST 84/69/6295 24  0 - 37 U/L Final  . ALT 03/14/2013 25  0 - 53 U/L Final  . Alkaline Phosphatase 03/14/2013 69  39 - 117 U/L Final  . Total Bilirubin 03/14/2013 0.4  0.3 - 1.2 mg/dL Final  . GFR calc non Af Amer 03/14/2013 88* >90 mL/min Final  . GFR calc Af Amer 03/14/2013 >90  >90 mL/min Final   Comment:  (NOTE)                          The eGFR has been calculated using the CKD EPI equation.                          This calculation has not been validated in all clinical situations.                          eGFR's persistently <90 mL/min signify possible Chronic Kidney                          Disease.  [name] Prothrombin Time 03/14/2013 25.6* 11.6 - 15.2 seconds Final  . INR 03/14/2013 2.43* 0.00 - 1.49 Final  . ABO/RH(D) 03/14/2013 O POS   Final  . Antibody Screen 03/14/2013 NEG   Final  . Sample Expiration 03/14/2013 03/24/2013   Final  . Color, Urine 03/14/2013 YELLOW  YELLOW Final  . APPearance 03/14/2013 CLEAR  CLEAR Final  . Specific Gravity, Urine 03/14/2013 1.018  1.005 - 1.030 Final  . pH 03/14/2013 5.0  5.0 - 8.0 Final  . Glucose, UA 03/14/2013 NEGATIVE  NEGATIVE mg/dL Final  . Hgb urine dipstick 03/14/2013 TRACE* NEGATIVE Final  . Bilirubin Urine 03/14/2013 NEGATIVE  NEGATIVE Final  . Ketones, ur 03/14/2013 NEGATIVE  NEGATIVE mg/dL Final  . Protein, ur 28/41/3244 NEGATIVE  NEGATIVE mg/dL Final  . Urobilinogen, UA 03/14/2013 0.2  0.0 - 1.0 mg/dL Final  . Nitrite 06/25/7251 NEGATIVE  NEGATIVE Final  . Leukocytes, UA 03/14/2013 NEGATIVE  NEGATIVE Final  . RBC / HPF 03/14/2013 0-2  <3 RBC/hpf Final  . Urine-Other 03/14/2013 MUCOUS PRESENT   Final  . ABO/RH(D) 03/14/2013 O POS   Final  Appointment on 03/09/2013  Component Date Value Range Status  . Total Bilirubin 03/09/2013 0.8  0.3 - 1.2 mg/dL Final  . Bilirubin, Direct 03/09/2013 0.1  0.0 - 0.3 mg/dL Final  . Alkaline Phosphatase  03/09/2013 61  39 - 117 U/L Final  . AST 03/09/2013 24  0 - 37 U/L Final  . ALT 03/09/2013 24  0 - 53 U/L Final  . Total Protein 03/09/2013 7.7  6.0 - 8.3 g/dL Final  . Albumin 16/03/9603 3.7  3.5 - 5.2 g/dL Final  . Cholesterol 54/02/8118 218* 0 - 200 mg/dL Final   ATP III Classification       Desirable:  < 200 mg/dL               Borderline High:  200 - 239 mg/dL          High:  > = 147 mg/dL  .  Triglycerides 03/09/2013 145.0  0.0 - 149.0 mg/dL Final   Normal:  <829 mg/dLBorderline High:  150 - 199 mg/dL  . HDL 03/09/2013 26.20* >39.00 mg/dL Final  . VLDL 56/21/3086 29.0  0.0 - 40.0 mg/dL Final  . Total CHOL/HDL Ratio 03/09/2013 8   Final                  Men          Women1/2 Average Risk     3.4          3.3Average Risk          5.0          4.42X Average Risk          9.6          7.13X Average Risk          15.0          11.0                      . Sodium 03/09/2013 136  135 - 145 mEq/L Final  . Potassium 03/09/2013 4.2  3.5 - 5.1 mEq/L Final  . Chloride 03/09/2013 102  96 - 112 mEq/L Final  . CO2 03/09/2013 29  19 - 32 mEq/L Final  . Glucose, Bld 03/09/2013 101* 70 - 99 mg/dL Final  . BUN 57/84/6962 11  6 - 23 mg/dL Final  . Creatinine, Ser 03/09/2013 0.9  0.4 - 1.5 mg/dL Final  . Calcium 95/28/4132 9.0  8.4 - 10.5 mg/dL Final  . GFR 44/06/270 92.22  >60.00 mL/min Final  . Direct LDL 03/09/2013 168.1   Final   Optimal:  <100 mg/dLNear or Above Optimal:  100-129 mg/dLBorderline High:  130-159 mg/dLHigh:  160-189 mg/dLVery High:  >190 mg/dL     X-Rays:Dg Chest 2 View  03/31/2013   CLINICAL DATA:  Weakness. History of asthma.  EXAM: CHEST  2 VIEW  COMPARISON:  10/19/2012.  FINDINGS: Poor inspiration. Interval enlargement of the cardiac silhouette and mild increase in prominence of the pulmonary vasculature. Interval small left lateral pleural effusion. Clear lungs. Thoracic spine degenerative changes.  IMPRESSION: Interval cardiomegaly and small left pleural effusion.   Electronically Signed   By: Gordan Payment M.D.   On: 03/31/2013 20:37   Dg Knee Complete 4 Views Left  03/31/2013   CLINICAL DATA:  Left knee pain, bruising, swelling and increased warmth. Recent knee surgery on 03/21/2013.  EXAM: LEFT KNEE - COMPLETE 4+ VIEW  COMPARISON:  None.  FINDINGS: Left knee total prosthesis in satisfactory position and alignment. Probable small to moderate-sized effusion. Diffuse soft tissue  swelling, most pronounced anteriorly. No fracture or dislocation.  IMPRESSION: Left total knee prosthesis and probable small to moderate-sized knee effusion with soft tissue swelling.  Electronically Signed   By: Gordan Payment M.D.   On: 03/31/2013 20:36    EKG: Orders placed during the hospital encounter of 03/31/13  . EKG 12-LEAD  . EKG 12-LEAD  . EKG 12-LEAD  . EKG 12-LEAD  . EKG 12-LEAD  . EKG 12-LEAD  . EKG 12-LEAD  . EKG 12-LEAD     Hospital Course: Robert [name] is a 62 y.o. who was admitted to [facility] for increased pain and swelling in the left knee.  He underwent a left total knee arthroplasty 2 weeks ago. He was doing extremely well, and then unfortunately 4 days ago, started developing significant pain and swelling in the knee. He was on systemic anticoagulation for history of DVT and PE. He came to the hospital Thursday night for intractable pain. Dr. Victorino Dike performed an aspiration. Initial results were negative at that 48 hours, rare Staph species were identified.  Hospital day - 2 - Patient reported pain as moderate. Patient was seen in rounds with Dr. Lequita Halt. Wife in room at bedside.  Patient was having problems with pain in the knee and leg, requiring pain medications.  Continued elevation when not up ambulating. Keep today and home tomorrow.  Changed his robaxin to valium. Hospital day - 3 - Patient reported pain as moderate, pain better controlled with Valium, but still not able to bear much weight on the left leg. Complaining of pain in the right knee with swelling and pain shooting to the right foot. Plan was to eventually return home after hospital stay.  Hospital day - 4 - Still with ongoing c/o of pain in left knee. Patient was seen the day before by Dr. Charlann Boxer and according to patient there was possibility of I&D of L knee as there was Staph isolated in his aspirate. Pre oped him for probable I&D on Monday. Dr. Lequita Halt come back in on Monday and due to  persistent pain and due to finding on an aspirate, he presented to the operating room for irrigation and debridement, poly exchange, and possible antibiotic needs. They were brought to the operating room on 04/04/2013 and underwent Procedure(s): IRRIGATION AND DEBRIDEMENT KNEE WITH POLY EXCHANGE AND INSERTION OF CEMENT BEADS.  Patient tolerated the procedure well and was later transferred to the recovery room and then to the orthopaedic floor for postoperative care.  They were given PO and IV analgesics for pain control following their surgery.  They were given 24 hours of postoperative antibiotics of  Anti-infectives   Start     Dose/Rate Route Frequency Ordered Stop   04/07/13 0000  ceFAZolin (ANCEF) 2-3 GM-% SOLR     2 g 100 mL/hr over 30 Minutes Intravenous Every 6 hours 04/07/13 0921     04/05/13 2315  ceFAZolin (ANCEF) IVPB 2 g/50 mL premix  Status:  Discontinued    Comments:  Pt had hives to PCN in 1970s. Will try Ancef and will discontinue if any adverse reaction   2 g 100 mL/hr over 30 Minutes Intravenous 4 times per day 04/04/13 2157 04/05/13 1401   04/05/13 1800  ceFAZolin (ANCEF) IVPB 2 g/50 mL premix    Comments:  Pt had hives to PCN in 1970s. Will try Ancef and will discontinue if any adverse reaction   2 g 100 mL/hr over 30 Minutes Intravenous 4 times per day 04/05/13 1401 05/03/13 1559   04/04/13 1705  vancomycin (VANCOCIN) powder  Status:  Discontinued       As needed 04/04/13 1706 04/04/13 1758  04/04/13 1000  vancomycin (VANCOCIN) 1,500 mg in sodium chloride 0.9 % 500 mL IVPB  Status:  Discontinued     1,500 mg 250 mL/hr over 120 Minutes Intravenous Every 12 hours 04/03/13 2359 04/05/13 1401   04/01/13 1200  vancomycin (VANCOCIN) 1,250 mg in sodium chloride 0.9 % 250 mL IVPB  Status:  Discontinued     1,250 mg 166.7 mL/hr over 90 Minutes Intravenous Every 12 hours 03/31/13 2328 04/03/13 2358   03/31/13 2345  vancomycin (VANCOCIN) 2,500 mg in sodium chloride 0.9 % 500 mL  IVPB     2,500 mg 250 mL/hr over 120 Minutes Intravenous  Once 03/31/13 2312 04/01/13 0212   03/31/13 2315  ciprofloxacin (CIPRO) tablet 500 mg  Status:  Discontinued     500 mg Oral 2 times daily 03/31/13 2302 04/04/13 2157     and started on DVT prophylaxis in the form of Lovenox and Coumadin.    PACU called to update about a change in patient. Underwent Debridement and poly exchange of Left knee by Dr Lequita Halt. Patient History positive for PE and obesity. Anticoagulated with warfarin. SVT on EKG with a rate of 150-160. No cardiac symptoms reported Dr. Rica Mast evaluated patient and ordered Esmolol IV, HR lowered to 130. Patient was monitored closely, when HR increased to 145 BPM. Adenosine 6mg  IV given and lowered HR to 100-110. Patient was seen for rapid HR in the past, not currently on cardiac medications. Patient is unable to recall the cardiologist.  SVT: Cardiology consulted [facility]) patient was discussed and will be seen.  Follow Cardiology recommendations.  Tannsferred to Telemetry for monitoring. Placed into Stepdown Cardiology Consult: Assessment/Plan:  Mr. Jewkes is a 62 yo man with PMH of DVT/PE, dyslipidemia, left knee TKA/hematoma who has post-operative SVT now currently in sinus tachycardia. He has never had tachycardia or palpitations before. He is currently chest pain free. He does have some considerable left knee pain that may be driving the SVT via catecholamines. He's currently on a monitor. Differential includes structural heart disease, pain driven SVT, isolated incident or recurrent SVT such as AVNRT, AVRT or even atrial flutter or atrial fibrillation; however, I do not see strips when adenosine was given to review further. I favor watching overnight. I will start CCB should he have another event. Echocardiogram in AM given last 2 years ago, I will add on BNP. I have discussed with prior team coverage and anticoagulation to be restarted soon (in AM) with warfarin bridge.  -  continue telemetry  - will add on CCB, diltiazem should another SVT occur (none since PACU per primary RN)  - Echocardiogram in AM  - consider holter monitor at home for 48 hours  - add on BNP, update TSH  - treat pain adequately  - please call us if any changes in symptoms or concerning findings  KELLY, JACOB Patient had a rough night on the evening of surgery due to the above cardiac history.  Hemovac drains was left in place.   1 Day Post-Op Procedure(s) (LRB):  IRRIGATION AND DEBRIDEMENT KNEE WITH POLY EXCHANGE AND INSERTION OF CEMENT BEADS (Left)  Patient reported pain as moderate.  Patient seen in rounds with Dr. Lequita Halt. Wife in room at bedside. Briefly discussed the operative findings.  Events of the previous night reviewed by Dr. Lequita Halt. He spoke with Dr. Eden Emms earlier on rounds. ECHO performed.  Patient was having problems with pain in the knee, requiring pain medications. Plan was to go Home after hospital stay. PICC line  ordered for home antibiotics. Post-op Day 2 - Continued to work with therapy into day two.  Dressing was changed on day two and the incision was healing well but did have some bloody serous drainage for the drain sites.   Assessment/Plan:  Tachycardia: Strips not available ? SVT per fellow. Watched him this am and increased HR clearly adrenergically driven with obesity and pain and any activity. Will change to lopressor q 6 hours  Orhto: Plan for [facility] line and 4 weeks antibiotics Patient does not want PIC ? Can he get peripheral iv changed weekly at home  DVT: Resume anticoagulationper ortho/pharmacy Post-op Day 3 - The patient had progressed with therapy and meeting their goals.  Incision was healing well.  Patient was seen in rounds and was ready to go home if gets all the antibiotics arranged for home. Will need ANCEF for a total of 28 days via PICC line. Assessment/Plan:  Tachycardia: Improved d/c home with lopressor 50 bid  Orhto: Plan for [facility] line and 4  weeks antibiotics  DVT: Resume anticoagulationper ortho/pharmacy INR 1.61 ? If he goes home today doe he need lovenox for a day or two ?  RX for lopressor 50 mg twice a day for a month will be provided for the patient prior to release.  He will be sent out on Lovenox for 4 days for coverage until INR is therapeutic again. Home after arrangements are completed.   Discharge Medications: Prior to Admission medications   Medication Sig Start Date End Date Taking? Authorizing Provider  albuterol (PROVENTIL HFA;VENTOLIN HFA) 108 (90 BASE) MCG/ACT inhaler Inhale 2 puffs into the lungs every 6 (six) hours as needed for wheezing.   Yes Historical Provider, MD  cetirizine (ZYRTEC) 10 MG tablet Take 10 mg by mouth daily.    Yes Historical Provider, MD  clotrimazole-betamethasone (LOTRISONE) cream Apply 1 application topically 2 (two) times daily as needed. Apply to red spots on face   Yes Historical Provider, MD  diphenoxylate-atropine (LOMOTIL) 2.5-0.025 MG per tablet Take 1-2 tablets by mouth 4 (four) times daily as needed for diarrhea or loose stools.    Yes Historical Provider, MD  docusate sodium (COLACE) 100 MG capsule Take 100 mg by mouth 2 (two) times daily.   Yes Historical Provider, MD  nortriptyline (PAMELOR) 10 MG capsule Take 10 mg by mouth every evening.   Yes Historical Provider, MD  warfarin (COUMADIN) 6 MG tablet Take 6 mg by mouth daily.   Yes Historical Provider, MD  ceFAZolin (ANCEF) 2-3 GM-% SOLR Inject 50 mLs (2 g total) into the vein every 6 (six) hours. Ancef for a total of 28 days via PICC line. 04/07/13   Charlott Calvario, PA-C  diazepam (VALIUM) 5 MG tablet Take 1 tablet (5 mg total) by mouth every 6 (six) hours as needed. 04/07/13   Mainor Hellmann, PA-C  enoxaparin (LOVENOX) 30 MG/0.3ML injection Inject 0.3 mLs (30 mg total) into the skin every 12 (twelve) hours. 04/07/13   Daionna Crossland, PA-C  hydrocortisone cream 1 % Apply topically 3 (three) times daily as needed.  Apply to back area as needed for itching 04/07/13   Shandreka Dante, PA-C  HYDROmorphone (DILAUDID) 2 MG tablet Take 1-2 tablets (2-4 mg total) by mouth every 3 (three) hours as needed for pain. 04/07/13   Mariann Palo, PA-C  methocarbamol (ROBAXIN) 500 MG tablet Take 1 tablet (500 mg total) by mouth every 6 (six) hours as needed. 04/07/13   Thresea Doble, PA-C  metoprolol (LOPRESSOR) 50  MG tablet Take 1 tablet (50 mg total) by mouth 2 (two) times daily. 04/07/13   Shelly Spenser Julien Girt, PA-C    Diet: Cardiac diet Activity:WBAT Follow-up:in 1 weeks Disposition - Home Discharged Condition: Pending, home if arrangements are completed for IV Ancef at home via PICC line.   Discharge Orders   Future Appointments Provider Department Dept Phone   05/13/2013 7:45 AM Tresa Garter, MD [facility] [phone]   Future Orders Complete By Expires   Call MD / Call 911  As directed    Comments:     If you experience chest pain or shortness of breath, CALL 911 and be transported to the hospital emergency room.  If you develope a fever above 101 F, pus (white drainage) or increased drainage or redness at the wound, or calf pain, call your surgeon's office.   Change dressing  As directed    Comments:     Change dressing daily with sterile 4 x 4 inch gauze dressing and apply TED hose. Do not submerge the incision under water.   Constipation Prevention  As directed    Comments:     Drink plenty of fluids.  Prune juice may be helpful.  You may use a stool softener, such as Colace (over the counter) 100 mg twice a day.  Use MiraLax (over the counter) for constipation as needed.   Diet - low sodium heart healthy  As directed    Discharge instructions  As directed    Comments:     Pick up stool softner and laxative for home. Do not submerge incision under water. May shower. Continue to use ice for pain and swelling from surgery.   Take Coumadin for three  weeks for postoperative protocol and then the patient may resume their previous Coumadin home regimen.  The dose may need to be adjusted based upon the INR.  Please follow the INR and titrate Coumadin dose for a therapeutic range between 2.0 and 3.0 INR.  After completing the three weeks of Coumadin, the patient may resume their previous Coumadin home regimen.  Continue Lovenox injections until the INR is therapeutic at or greater than 2.0.  When INR reaches the therapeutic level of equal to or greater than 2.0, the patient may discontinue the Lovenox injections.  ANCEF IV via PICC line for 27 more days to complete a 28 day regimen.   Do not put a pillow under the knee. Place it under the heel.  As directed    Do not sit on low chairs, stoools or toilet seats, as it may be difficult to get up from low surfaces  As directed    Driving restrictions  As directed    Comments:     No driving until released by the physician.   Increase activity slowly as tolerated  As directed    Lifting restrictions  As directed    Comments:     No lifting until released by the physician.   Patient may shower  As directed    Comments:     You may shower without a dressing once there is no drainage.  Do not wash over the wound.  If drainage remains, do not shower until drainage stops.   TED hose  As directed    Comments:     Use stockings (TED hose) for 3 weeks on both leg(s).  You may remove them at night for sleeping.   Weight bearing as tolerated  As directed  Questions:     Laterality:     Extremity:         Medication List    STOP taking these medications       furosemide 20 MG tablet  Commonly known as:  LASIX      TAKE these medications       albuterol 108 (90 BASE) MCG/ACT inhaler  Commonly known as:  PROVENTIL HFA;VENTOLIN HFA  Inhale 2 puffs into the lungs every 6 (six) hours as needed for wheezing.     ceFAZolin 2-3 GM-% Solr  Commonly known as:  ANCEF  Inject 50 mLs (2 g total) into  the vein every 6 (six) hours. Ancef for a total of 28 days via PICC line.     cetirizine 10 MG tablet  Commonly known as:  ZYRTEC  Take 10 mg by mouth daily.     clotrimazole-betamethasone cream  Commonly known as:  LOTRISONE  Apply 1 application topically 2 (two) times daily as needed. Apply to red spots on face     diazepam 5 MG tablet  Commonly known as:  VALIUM  Take 1 tablet (5 mg total) by mouth every 6 (six) hours as needed.     diphenoxylate-atropine 2.5-0.025 MG per tablet  Commonly known as:  LOMOTIL  Take 1-2 tablets by mouth 4 (four) times daily as needed for diarrhea or loose stools.     docusate sodium 100 MG capsule  Commonly known as:  COLACE  Take 100 mg by mouth 2 (two) times daily.     enoxaparin 30 MG/0.3ML injection  Commonly known as:  LOVENOX  Inject 0.3 mLs (30 mg total) into the skin every 12 (twelve) hours.     hydrocortisone cream 1 %  Apply topically 3 (three) times daily as needed. Apply to back area as needed for itching     HYDROmorphone 2 MG tablet  Commonly known as:  DILAUDID  Take 1-2 tablets (2-4 mg total) by mouth every 3 (three) hours as needed for pain.     methocarbamol 500 MG tablet  Commonly known as:  ROBAXIN  Take 1 tablet (500 mg total) by mouth every 6 (six) hours as needed.     metoprolol 50 MG tablet  Commonly known as:  LOPRESSOR  Take 1 tablet (50 mg total) by mouth 2 (two) times daily.     nortriptyline 10 MG capsule  Commonly known as:  PAMELOR  Take 10 mg by mouth every evening.     warfarin 6 MG tablet  Commonly known as:  COUMADIN  Take 6 mg by mouth daily.           Follow-up Information   Follow up with [facility] (Home Health Physical Therapy)    Contact information:   [phone]      Follow up with Loanne Drilling, MD. Schedule an appointment as soon as possible for a visit in 1 week. (Follow up on either Thursday or Friday of next week (10/23 or 10/24).  Call for appointment time.)     Specialty:  Orthopedic Surgery   Contact information:   [address] [place] [address] [phone]       Signed: Patrica Duel 04/07/2013, 9:26 AM

## 2013-04-09 LAB — ANAEROBIC CULTURE: Gram Stain: NONE SEEN

## 2013-04-20 ENCOUNTER — Other Ambulatory Visit: Payer: Self-pay | Admitting: *Deleted

## 2013-04-20 MED ORDER — METOPROLOL TARTRATE 50 MG PO TABS: 50.0000 mg | ORAL_TABLET | Freq: Two times a day (BID) | ORAL | Status: DC

## 2013-05-05 ENCOUNTER — Telehealth: Payer: Self-pay | Admitting: Internal Medicine

## 2013-05-05 ENCOUNTER — Telehealth: Payer: Self-pay | Admitting: General Practice

## 2013-05-05 NOTE — Telephone Encounter (Signed)
Noted Agree w/plan Thx 

## 2013-05-05 NOTE — Telephone Encounter (Signed)
Spoke with patient this morning.  Patient had knee surgery back in August and has had had multiple challenges since surgery.  Patient states that [facility] is following INR and that they are calling results to Dr. Antony Odea.  I asked patient to call the office when he is released from home health.  Patient verbalized understanding.

## 2013-05-05 NOTE — Telephone Encounter (Signed)
Patient Information:  Caller Name: Robert [name]  Phone: [phone]  Patient: Robert [name], Robert [name]  Gender: Male  DOB: [birth year]  Age: 62 Years  PCP: [name], Alex (Adults only)  Office Follow Up:  Does the office need to follow up with this patient?: No  Instructions For The Office: N/A  RN Note:  I called the office and spoke with Neisha.  She advised no appts and I could send to the ED.  He didn't want to go to the ED to risk exposure to germs and his PIC line.  They have sterile gloves in the home and I instructed Melinda on placing one over the port on his PIC line and to also wear masks when in the ED.  Symptoms  Reason For Call & Symptoms: Wife, Robert [name] calling.  He has severe pain behind the right knee with any movement.   No redness or swelling.  Had a blood clot in same location ears ago.  Had left knee replacement and replacement got infected.  Antibiotics given and PIC line in place.  The lower right leg is slightly swollen.    He is on Coumadin and his last INT 2.5.  Reviewed Health History In EMR: No  Reviewed Medications In EMR: No  Reviewed Allergies In EMR: No  Reviewed Surgeries / Procedures: No  Date of Onset of Symptoms: 05/04/2013  Guideline(s) Used:  Leg Swelling and Edema  Disposition Per Guideline:   Go to ED Now (or to Office with PCP Approval)  Reason For Disposition Reached:   Thigh or calf pain and only 1 side and present > 1 hour  Advice Given:  N/A  Patient Will Follow Care Advice:  YES

## 2013-05-07 ENCOUNTER — Emergency Department (HOSPITAL_COMMUNITY): Payer: 59

## 2013-05-07 ENCOUNTER — Encounter (HOSPITAL_COMMUNITY): Payer: Self-pay | Admitting: Emergency Medicine

## 2013-05-07 ENCOUNTER — Emergency Department (HOSPITAL_COMMUNITY)
Admission: EM | Admit: 2013-05-07 | Discharge: 2013-05-07 | Disposition: A | Discharge status: Home / Self Care | Payer: 59 | Attending: Emergency Medicine | Admitting: Emergency Medicine

## 2013-05-07 DIAGNOSIS — R269 Unspecified abnormalities of gait and mobility: Secondary | ICD-10-CM | POA: Insufficient documentation

## 2013-05-07 DIAGNOSIS — F411 Generalized anxiety disorder: Secondary | ICD-10-CM | POA: Insufficient documentation

## 2013-05-07 DIAGNOSIS — Z7901 Long term (current) use of anticoagulants: Secondary | ICD-10-CM | POA: Insufficient documentation

## 2013-05-07 DIAGNOSIS — M25461 Effusion, right knee: Secondary | ICD-10-CM

## 2013-05-07 DIAGNOSIS — M25469 Effusion, unspecified knee: Secondary | ICD-10-CM | POA: Insufficient documentation

## 2013-05-07 DIAGNOSIS — R059 Cough, unspecified: Secondary | ICD-10-CM | POA: Insufficient documentation

## 2013-05-07 DIAGNOSIS — R1013 Epigastric pain: Secondary | ICD-10-CM | POA: Insufficient documentation

## 2013-05-07 DIAGNOSIS — R609 Edema, unspecified: Secondary | ICD-10-CM

## 2013-05-07 DIAGNOSIS — M7989 Other specified soft tissue disorders: Secondary | ICD-10-CM

## 2013-05-07 DIAGNOSIS — Z86718 Personal history of other venous thrombosis and embolism: Secondary | ICD-10-CM | POA: Insufficient documentation

## 2013-05-07 DIAGNOSIS — E669 Obesity, unspecified: Secondary | ICD-10-CM | POA: Insufficient documentation

## 2013-05-07 DIAGNOSIS — Z86711 Personal history of pulmonary embolism: Secondary | ICD-10-CM | POA: Insufficient documentation

## 2013-05-07 DIAGNOSIS — Z8614 Personal history of Methicillin resistant Staphylococcus aureus infection: Secondary | ICD-10-CM | POA: Insufficient documentation

## 2013-05-07 DIAGNOSIS — R072 Precordial pain: Secondary | ICD-10-CM | POA: Insufficient documentation

## 2013-05-07 DIAGNOSIS — IMO0001 Reserved for inherently not codable concepts without codable children: Secondary | ICD-10-CM | POA: Insufficient documentation

## 2013-05-07 DIAGNOSIS — M25561 Pain in right knee: Secondary | ICD-10-CM

## 2013-05-07 DIAGNOSIS — R05 Cough: Secondary | ICD-10-CM | POA: Insufficient documentation

## 2013-05-07 DIAGNOSIS — Z79899 Other long term (current) drug therapy: Secondary | ICD-10-CM | POA: Insufficient documentation

## 2013-05-07 DIAGNOSIS — Z96659 Presence of unspecified artificial knee joint: Secondary | ICD-10-CM | POA: Insufficient documentation

## 2013-05-07 DIAGNOSIS — J45909 Unspecified asthma, uncomplicated: Secondary | ICD-10-CM | POA: Insufficient documentation

## 2013-05-07 DIAGNOSIS — M79609 Pain in unspecified limb: Secondary | ICD-10-CM

## 2013-05-07 DIAGNOSIS — M25569 Pain in unspecified knee: Secondary | ICD-10-CM | POA: Insufficient documentation

## 2013-05-07 DIAGNOSIS — Z880 Allergy status to penicillin: Secondary | ICD-10-CM | POA: Insufficient documentation

## 2013-05-07 LAB — CBC WITH DIFFERENTIAL/PLATELET
Basophils Absolute: 0 10*3/uL (ref 0.0–0.1)
Basophils Relative: 0 % (ref 0–1)
Eosinophils Absolute: 0.4 10*3/uL (ref 0.0–0.7)
Eosinophils Relative: 4 % (ref 0–5)
HCT: 34.9 % — ABNORMAL LOW (ref 39.0–52.0)
Hemoglobin: 11.9 g/dL — ABNORMAL LOW (ref 13.0–17.0)
Lymphocytes Relative: 20 % (ref 12–46)
Lymphs Abs: 1.8 10*3/uL (ref 0.7–4.0)
MCH: 30.6 pg (ref 26.0–34.0)
MCHC: 34.1 g/dL (ref 30.0–36.0)
MCV: 89.7 fL (ref 78.0–100.0)
Monocytes Absolute: 1.3 10*3/uL — ABNORMAL HIGH (ref 0.1–1.0)
Monocytes Relative: 14 % — ABNORMAL HIGH (ref 3–12)
Neutro Abs: 5.5 10*3/uL (ref 1.7–7.7)
Neutrophils Relative %: 61 % (ref 43–77)
Platelets: 343 10*3/uL (ref 150–400)
RBC: 3.89 MIL/uL — ABNORMAL LOW (ref 4.22–5.81)
RDW: 14.1 % (ref 11.5–15.5)
WBC: 9 10*3/uL (ref 4.0–10.5)

## 2013-05-07 LAB — COMPREHENSIVE METABOLIC PANEL
ALT: 9 U/L (ref 0–53)
AST: 22 U/L (ref 0–37)
Albumin: 3.2 g/dL — ABNORMAL LOW (ref 3.5–5.2)
Alkaline Phosphatase: 82 U/L (ref 39–117)
BUN: 10 mg/dL (ref 6–23)
CO2: 26 mEq/L (ref 19–32)
Calcium: 9.2 mg/dL (ref 8.4–10.5)
Chloride: 98 mEq/L (ref 96–112)
Creatinine, Ser: 0.74 mg/dL (ref 0.50–1.35)
GFR calc Af Amer: >90 mL/min (ref >90)
GFR calc non Af Amer: >90 mL/min (ref >90)
Glucose, Bld: 112 mg/dL — ABNORMAL HIGH (ref 70–99)
Potassium: 4.3 mEq/L (ref 3.5–5.1)
Sodium: 134 mEq/L — ABNORMAL LOW (ref 135–145)
Total Bilirubin: 1 mg/dL (ref 0.3–1.2)
Total Protein: 8.2 g/dL (ref 6.0–8.3)

## 2013-05-07 LAB — PROTIME-INR
INR: 1.8 — ABNORMAL HIGH (ref 0.00–1.49)
Prothrombin Time: 20.4 seconds — ABNORMAL HIGH (ref 11.6–15.2)

## 2013-05-07 LAB — POCT I-STAT TROPONIN I: Troponin i, poc: 0.01 ng/mL (ref 0.00–0.08)

## 2013-05-07 MED ORDER — MORPHINE SULFATE 4 MG/ML IJ SOLN
4.0000 mg | Freq: Once | INTRAMUSCULAR | Status: AC
  Administered 2013-05-07: 4 mg via INTRAMUSCULAR
  Filled 2013-05-07: qty 1

## 2013-05-07 MED ORDER — OXYCODONE HCL 5 MG PO TABS: 5.0000 mg | ORAL_TABLET | Freq: Four times a day (QID) | ORAL | Status: DC | PRN

## 2013-05-07 MED ORDER — HEPARIN SOD (PORK) LOCK FLUSH 100 UNIT/ML IV SOLN
500.0000 [IU] | Freq: Once | INTRAVENOUS | Status: AC
  Administered 2013-05-07: 500 [IU]
  Filled 2013-05-07: qty 5

## 2013-05-07 MED ORDER — ONDANSETRON HCL 4 MG PO TABS: 4.0000 mg | ORAL_TABLET | Freq: Four times a day (QID) | ORAL | Status: DC

## 2013-05-07 MED ORDER — ONDANSETRON 8 MG PO TBDP
8.0000 mg | ORAL_TABLET | Freq: Once | ORAL | Status: AC
  Administered 2013-05-07: 8 mg via ORAL
  Filled 2013-05-07: qty 1

## 2013-05-07 NOTE — ED Provider Notes (Signed)
Medical screening examination/treatment/procedure(s) were performed by non-physician practitioner and as supervising physician I was immediately available for consultation/collaboration.  EKG Interpretation     Ventricular Rate:  79 PR Interval:  191 QRS Duration: 81 QT Interval:  358 QTC Calculation: 410 R Axis:   76 Text Interpretation:  Sinus rhythm Since last tracing rate slower qt duration has decreased, t wave changes have decreased              Celene Kras, MD 05/07/13 2354

## 2013-05-07 NOTE — Progress Notes (Addendum)
VASCULAR LAB PRELIMINARY  PRELIMINARY  PRELIMINARY  PRELIMINARY  Right lower extremity venous Doppler completed.    Preliminary report:  There is no acute DVT or SVT noted in the right lower extremity.  There is chronic DVT noted in the common femoral, femoral, popliteal, and posterior tibial veins of the right lower extremtiy.  There is a large amount of fluid noted anterior right knee.    Limmie Schoenberg, RVT 05/07/2013, 6:35 PM

## 2013-05-07 NOTE — ED Provider Notes (Signed)
CSN: [id number]     Arrival date & time 05/07/13  1418 History   First MD Initiated Contact with Patient 05/07/13 1508     Chief Complaint  Patient presents with  . Chest Pain  . Knee Pain    right   (Consider location/radiation/quality/duration/timing/severity/associated sxs/prior Treatment) HPI Comments: Patient is a 62 year old male with history of PE, hyperlipidemia, obesity, PVD, anxiety, DVT, asthma who presents today with right knee pain since Thursday. He had recent TKA in his left knee. His original surgery was in September. He had complications with MRSA infection which required revision of his left TKA on 10/9. He was on IV abx through his PICC line until Wednesday. His left knee appears to be healing well and he only has a chronic burning pain in the left knee. His right knee is throbbing. Pain is worse with ambulation and palpation. He cannot currently bear weight on his right knee. It has swollen up significantly. He cannot lift his leg up when he is laying in the bed. He used to have chronic swelling in this leg as it was the leg affected by the DVT, however after the surgery his leg shrunk back down to its normal size. He does not currently feel short of breath. No pleuritic chest pain. He does note that he has been wheezing more than normal the past 2 nights. No hemoptysis. No fevers, chills, nausea, vomiting. He has had a burning epigastric pain today that has been intermittent. It lasts for a few minutes when it comes and is relieved by burping. He believes this is due to not eating much today.  He last had his INR measured yesterday and it was 2.4.   The history is provided by the patient and the spouse.    Past Medical History  Diagnosis Date  . Warfarin anticoagulation   . LBP (low back pain)   . History of pulmonary embolism 2003    both lungs  . Edema     Right Leg w/ h/o DVT postphlebitic  . Hyperlipidemia   . Osteoarthritis   . Obesity   . Knee pain     left  .  Peripheral vascular disease     "poor circulation in  RT leg"  . Anxiety   . Spondylolisthesis   . DVT (deep venous thrombosis) 2003    right femoral artery "from groin to knee"  . Asthma     "no attack since acupuncture in 1990's"  . Wheezing     when laying on left side  . 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 Cyst of left kidney     "benign"  . Headache(784.0)     MIGRAINES-not for a long time  . Complication of anesthesia     SLOW TO WAKE UP / DIFFICULTY RESPONDING 2003, 3 days before could use left arm, "wild/crazy sometimes"  . PONV (postoperative nausea and vomiting)     during surgery in 1990's  . H/O blood transfusion reaction 2004    FFP, extreme swelling, could not breath   Past Surgical History  Procedure Laterality Date  . Lumbar fusion  2003    Dr Jillyn Hidden  . Orchiectomy  1994    R post injury  . Knee arthroscopy  1990    RT KNEE  . Knee arthroscopy Right 2003  . Knee arthroscopy  1988    L KNEE  . Finger surgery  2012    RT  THUMB RECONSTRUCTION  . Knee arthroscopy with meniscal repair Left 10/22/2012    Procedure: LEFT KNEE ARTHROSCOPY PARTIAL MEDIAL AND LATERAL MENISCECTOMY AND DEBRIDEMENT, CHONDROPLASTY ;  Surgeon: Javier Docker, MD;  Location: [facility];  Service: Orthopedics;  Laterality: Left;  . Tonsillectomy  1967  . Appendectomy  1993    Dr Jamey Ripa  . Cholecystectomy  1993  . Hemorroidectomy  1981  . Hand surgery Right     abcess  . Lumbar disc surgery    . Total knee arthroplasty Left 03/21/2013    Procedure: LEFT TOTAL KNEE ARTHROPLASTY;  Surgeon: Loanne Drilling, MD;  Location: [facility];  Service: Orthopedics;  Laterality: Left;  . I&d knee with poly exchange Left 04/04/2013    Procedure: IRRIGATION AND DEBRIDEMENT KNEE WITH POLY EXCHANGE AND INSERTION OF CEMENT BEADS;  Surgeon: Loanne Drilling, MD;  Location: [facility];  Service: Orthopedics;  Laterality: Left;   Family History  Problem Relation Age of Onset  . Hyperlipidemia  Other   . Hypertension Other   . Parkinsonism Other   . Heart disease Mother     CABG at age 60   History  Substance Use Topics  . Smoking status: Never Smoker   . Smokeless tobacco: Never Used  . Alcohol Use: No    Review of Systems  Constitutional: Negative for fever and chills.  Respiratory: Positive for cough. Negative for shortness of breath.   Cardiovascular: Negative for chest pain.  Gastrointestinal: Positive for abdominal pain. Negative for nausea and vomiting.  Musculoskeletal: Positive for arthralgias, gait problem, joint swelling and myalgias.  All other systems reviewed and are negative.    Allergies  Exalgo; Fentanyl; Hydrocodone-acetaminophen; Penicillins; Sulfonamide derivatives; and Tylenol  Home Medications   Current Outpatient Rx  Name  Route  Sig  Dispense  Refill  . albuterol (PROVENTIL HFA;VENTOLIN HFA) 108 (90 BASE) MCG/ACT inhaler   Inhalation   Inhale 2 puffs into the lungs every 6 (six) hours as needed for wheezing.         . cetirizine (ZYRTEC) 10 MG tablet   Oral   Take 10 mg by mouth daily.          . Cyanocobalamin (VITAMIN B-12) 5000 MCG SUBL   Sublingual   Place 5,000 tablets under the tongue every morning.         . diazepam (VALIUM) 5 MG tablet   Oral   Take 1 tablet (5 mg total) by mouth every 6 (six) hours as needed.   60 tablet   0   . fish oil-omega-3 fatty acids 1000 MG capsule   Oral   Take 2 g by mouth every morning.         [name] glucosamine-chondroitin 500-400 MG tablet   Oral   Take 1 tablet by mouth every morning.         [name] HYDROmorphone (DILAUDID) 2 MG tablet   Oral   Take 1-2 tablets (2-4 mg total) by mouth every 3 (three) hours as needed for pain.   80 tablet   0   . methocarbamol (ROBAXIN) 500 MG tablet   Oral   Take 1 tablet (500 mg total) by mouth every 6 (six) hours as needed.   80 tablet   0   . metoprolol (LOPRESSOR) 50 MG tablet   Oral   Take 1 tablet (50 mg total) by mouth 2 (two) times  daily.   60 tablet   12   . nortriptyline (PAMELOR) 10 MG capsule  Oral   Take 10 mg by mouth every evening.         . warfarin (COUMADIN) 6 MG tablet   Oral   Take 6 mg by mouth daily. Take 3mg  (1/2 tablet) on fridays, all other days take 6 mg ( 1 tablet)         . ceFAZolin (ANCEF) 2-3 GM-% SOLR   Intravenous   Inject 50 mLs (2 g total) into the vein every 6 (six) hours. Ancef for a total of 28 days via PICC line.   108 each   0    BP 113/68  Pulse 69  Temp(Src) 97.6 F (36.4 C)  Resp 16  Ht 5\' 11"  (1.803 m)  Wt 269 lb (122.018 kg)  BMI 37.53 kg/m2  SpO2 100% Physical Exam  Nursing note and vitals reviewed. Constitutional: He is oriented to person, place, and time. He appears well-developed and well-nourished. No distress.  HENT:  Head: Normocephalic and atraumatic.  Right Ear: External ear normal.  Left Ear: External ear normal.  Nose: Nose normal.  Eyes: Conjunctivae are normal.  Neck: Normal range of motion. No tracheal deviation present.  Cardiovascular: Normal rate, regular rhythm and normal heart sounds.   Pulmonary/Chest: Effort normal and breath sounds normal. No stridor.  Abdominal: Soft. He exhibits no distension. There is no tenderness.  Musculoskeletal: Normal range of motion.  Significant swelling and tenderness to right knee. Tenderness is diffuse. Strength is decreased in his right leg. TTP over right calf. Compartment soft.  Well healing incision site from TKA on left. No erythema, streaking drainage.   Neurological: He is alert and oriented to person, place, and time.  Skin: Skin is warm and dry. He is not diaphoretic.  Psychiatric: He has a normal mood and affect. His behavior is normal.    ED Course  Procedures (including critical care time) Labs Review Labs Reviewed  CBC WITH DIFFERENTIAL - Abnormal; Notable for the following:    RBC 3.89 (*)    Hemoglobin 11.9 (*)    HCT 34.9 (*)    Monocytes Relative 14 (*)    Monocytes Absolute  1.3 (*)    All other components within normal limits  COMPREHENSIVE METABOLIC PANEL - Abnormal; Notable for the following:    Sodium 134 (*)    Glucose, Bld 112 (*)    Albumin 3.2 (*)    All other components within normal limits  PROTIME-INR - Abnormal; Notable for the following:    Prothrombin Time 20.4 (*)    INR 1.80 (*)    All other components within normal limits  POCT I-STAT TROPONIN I   Imaging Review Dg Lumbar Spine Complete  05/07/2013   CLINICAL DATA:  Lower back pain. History of multiple surgeries. No recent injury.  EXAM: LUMBAR SPINE - COMPLETE 4+ VIEW  COMPARISON:  None.  FINDINGS: The patient has had previous posterior fusion at L5-S1. There is 6 mm of anterolisthesis of L4 on L5. Possible pars defects at L4. No suspicious lytic or blastic lesions are identified. Degenerative changes are identified in the lower thoracic spine and upper lumbar spine.  IMPRESSION: 1. 6 mm of anterolisthesis of L4 on L5. Question of pars defects at L4. 2. Status post fusion at L5-S1.   Electronically Signed   By: Rosalie Gums M.D.   On: 05/07/2013 17:31    EKG Interpretation     Ventricular Rate:  79 PR Interval:  191 QRS Duration: 81 QT Interval:  358 QTC Calculation: 410 R  Axis:   76 Text Interpretation:  Sinus rhythm Since last tracing rate slower qt duration has decreased, t wave changes have decreased            MDM   1. Edema   2. Knee effusion, right   3. Right knee pain    Patient presents emergency Department with pain and swelling in right knee. A vascular ultrasound was done which shows no new DVT. He does still have a chronic DVT. INR was 1.8 which was discussed. This is likely to patient missing evening dose of coumadin, which he took in ED after blood was drawn. Patient was moving the right knee back and forth on the bed without pain prior to discharge. There is no erythema, streaking. He is afebrile. No concern for septic joint or infection. Patient does have a  large effusion in his right knee. He will see Dr. Lequita Halt next week. He was given additional pain medicine for pain control in the interim. Patient is neurovascularly intact. Compartment is soft. Return instructions given. Vital signs are stable for discharge. I discussed this case with Dr. Lynelle Doctor who agrees with plan. Patient / Family / Caregiver informed of clinical course, understand medical decision-making process, and agree with plan.     Mora Bellman, PA-C 05/07/13 2142

## 2013-05-07 NOTE — ED Notes (Signed)
Pt states he had 2 knee replacement surgeries on right knee on 10/29.  Pt presents today with severe left knee pain and swelling.  Pt also c/o substernal chest pain.  Pt states he has had occasional chest pain since he was treated for a PE in 2003.  Pt denies N/V/D and weakness.

## 2013-05-11 ENCOUNTER — Encounter: Payer: Self-pay | Admitting: Internal Medicine

## 2013-05-11 ENCOUNTER — Encounter: Payer: Self-pay | Admitting: Cardiovascular Disease

## 2013-05-11 ENCOUNTER — Ambulatory Visit (INDEPENDENT_AMBULATORY_CARE_PROVIDER_SITE_OTHER): Payer: 59 | Admitting: Internal Medicine

## 2013-05-11 ENCOUNTER — Ambulatory Visit (INDEPENDENT_AMBULATORY_CARE_PROVIDER_SITE_OTHER): Payer: 59 | Admitting: Cardiovascular Disease

## 2013-05-11 VITALS — BP 118/58 | HR 72 | Ht 71.0 in | Wt 271.0 lb

## 2013-05-11 VITALS — BP 116/70 | HR 76 | Temp 98.3°F | Resp 16 | Wt 271.0 lb

## 2013-05-11 DIAGNOSIS — I80299 Phlebitis and thrombophlebitis of other deep vessels of unspecified lower extremity: Secondary | ICD-10-CM

## 2013-05-11 DIAGNOSIS — M1712 Unilateral primary osteoarthritis, left knee: Secondary | ICD-10-CM

## 2013-05-11 DIAGNOSIS — E785 Hyperlipidemia, unspecified: Secondary | ICD-10-CM

## 2013-05-11 DIAGNOSIS — M1710 Unilateral primary osteoarthritis, unspecified knee: Secondary | ICD-10-CM

## 2013-05-11 DIAGNOSIS — I350 Nonrheumatic aortic (valve) stenosis: Secondary | ICD-10-CM | POA: Insufficient documentation

## 2013-05-11 DIAGNOSIS — M1711 Unilateral primary osteoarthritis, right knee: Secondary | ICD-10-CM | POA: Insufficient documentation

## 2013-05-11 DIAGNOSIS — E669 Obesity, unspecified: Secondary | ICD-10-CM

## 2013-05-11 DIAGNOSIS — IMO0002 Reserved for concepts with insufficient information to code with codable children: Secondary | ICD-10-CM

## 2013-05-11 DIAGNOSIS — I471 Supraventricular tachycardia: Secondary | ICD-10-CM

## 2013-05-11 DIAGNOSIS — M009 Pyogenic arthritis, unspecified: Secondary | ICD-10-CM

## 2013-05-11 DIAGNOSIS — Z7901 Long term (current) use of anticoagulants: Secondary | ICD-10-CM

## 2013-05-11 DIAGNOSIS — I359 Nonrheumatic aortic valve disorder, unspecified: Secondary | ICD-10-CM

## 2013-05-11 DIAGNOSIS — M545 Low back pain, unspecified: Secondary | ICD-10-CM

## 2013-05-11 DIAGNOSIS — R7989 Other specified abnormal findings of blood chemistry: Secondary | ICD-10-CM

## 2013-05-11 MED ORDER — ALBUTEROL SULFATE HFA 108 (90 BASE) MCG/ACT IN AERS: 2.0000 | INHALATION_SPRAY | Freq: Four times a day (QID) | RESPIRATORY_TRACT | Status: DC | PRN

## 2013-05-11 NOTE — Assessment & Plan Note (Signed)
Cholesterol is at goal.  Continue current dose of statin and diet Rx.  No myalgias or side effects.  F/U  LFT's in 6 months. No results found for this basename: LDLCALC             

## 2013-05-11 NOTE — Assessment & Plan Note (Signed)
Continue with current prescription therapy as reflected on the Med list.  

## 2013-05-11 NOTE — Assessment & Plan Note (Signed)
Recent US with no acute clot despite all his knee issues.  Continue coumadin Goes to [facility] in [place] for his support hose

## 2013-05-11 NOTE — Progress Notes (Signed)
Subjective:    HPI  Robert [name] is a 62 yo man with PMH of prior DVT/PE on warfarin dyslipidemia, obesity with BMI 38, chronic left knee osteoarthritis with a fairly recent TKA (end of September) now s/p I&D of left knee hematoma October had an SVT of 150-160s In hospital had adenosine. Started on beta blockade with no furterh issues Having issues with right knee - R knee was tapped again recently.    Right Lower Extremity Venous Duplex Evaluation 05/07/13   Summary:  - Findings consistent with chronic deep vein thrombosis involving the common femoral, femoral, popliteal, and posterior tibial veins of theright lower extremity. There is fluid noted in the right anterior knee as well as an enlarged lymph node in the right groin. - No evidence of Baker's cyst on the right. Other specific details can be found in the table(s) above. Prepared and Electronically Authenticated by    The patient is here to follow up on chronic severe to moderate LBP/OA symptoms controlled partially with medicines (oxycodone), hand pain, B12 def, h/o PE/DVT  F/u LBP and LLE pain 5-6/10; it is still 10/10 in am     BP Readings from Last 3 Encounters:  05/11/13 116/70  05/11/13 118/58  05/07/13 113/68   Wt Readings from Last 3 Encounters:  05/11/13 271 lb (122.925 kg)  05/11/13 271 lb (122.925 kg)  05/07/13 269 lb (122.018 kg)      Review of Systems  Constitutional: Positive for fatigue.  HENT: Positive for congestion, postnasal drip and rhinorrhea. Negative for sinus pressure.   Respiratory: Positive for cough. Negative for chest tightness, shortness of breath and wheezing.   Cardiovascular: Positive for leg swelling.  Gastrointestinal: Negative for abdominal pain.  Genitourinary: Negative for frequency.  Musculoskeletal: Positive for arthralgias and back pain. Negative for myalgias.  Neurological: Negative for syncope.  Psychiatric/Behavioral: Positive for sleep disturbance. Negative  for suicidal ideas and dysphoric mood. The patient is not nervous/anxious.        Objective:   Physical Exam  Constitutional: He is oriented to person, place, and time. He appears well-developed.  obese  HENT:  Mouth/Throat: Oropharynx is clear and moist.  Eyes: Conjunctivae are normal. Pupils are equal, round, and reactive to light.  Neck: Normal range of motion. No JVD present. No thyromegaly present.  Cardiovascular: Normal rate, regular rhythm, normal heart sounds and intact distal pulses.  Exam reveals no gallop and no friction rub.   No murmur heard. Pulmonary/Chest: Effort normal and breath sounds normal. No respiratory distress. He has no wheezes. He has no rales. He exhibits no tenderness.  Abdominal: Soft. Bowel sounds are normal. He exhibits no distension and no mass. There is no tenderness. There is no rebound and no guarding.  Musculoskeletal: Normal range of motion. He exhibits edema and tenderness (LS spine).  Lymphadenopathy:    He has no cervical adenopathy.  Neurological: He is alert and oriented to person, place, and time. He has normal reflexes. No cranial nerve deficit. He exhibits normal muscle tone. Coordination normal.  Skin: Skin is warm and dry. No rash noted.  Psychiatric: He has a normal mood and affect. His behavior is normal. Judgment and thought content normal.    Lab Results  Component Value Date   WBC 9.0 05/07/2013   HGB 11.9* 05/07/2013   HCT 34.9* 05/07/2013   PLT 343 05/07/2013   GLUCOSE 112* 05/07/2013   CHOL 218* 03/09/2013   TRIG 145.0 03/09/2013   HDL 26.20* 03/09/2013   LDLDIRECT  168.1 03/09/2013   ALT 9 05/07/2013   AST 22 05/07/2013   NA 134* 05/07/2013   K 4.3 05/07/2013   CL 98 05/07/2013   CREATININE 0.74 05/07/2013   BUN 10 05/07/2013   CO2 26 05/07/2013   TSH 2.619 04/05/2013   PSA 0.13 12/14/2012   INR 1.80* 05/07/2013   HGBA1C 5.3 04/19/2007         Assessment & Plan:

## 2013-05-11 NOTE — Progress Notes (Signed)
Pre visit review using our clinic review tool, if applicable. No additional management support is needed unless otherwise documented below in the visit note. 

## 2013-05-11 NOTE — Assessment & Plan Note (Signed)
Mild on recent echo Murmur not well heard due to body habitus F/U up echo in a year

## 2013-05-11 NOTE — Assessment & Plan Note (Signed)
Labs Wt loss 

## 2013-05-11 NOTE — Assessment & Plan Note (Signed)
Better ROM in left Recent ? Purulent drainage of right F/U [name] this week Antibiotics stopped last week

## 2013-05-11 NOTE — Assessment & Plan Note (Signed)
Re-do 10/14 Dr Lequita Halt Vanco IV finished 1 wk ago via PICC

## 2013-05-11 NOTE — Assessment & Plan Note (Signed)
9/14 surgery Redo in 10/14 due to MRSA IV abx

## 2013-05-11 NOTE — Assessment & Plan Note (Signed)
Wt Readings from Last 3 Encounters:  05/11/13 271 lb (122.925 kg)  05/11/13 271 lb (122.925 kg)  05/07/13 269 lb (122.018 kg)

## 2013-05-11 NOTE — Patient Instructions (Signed)
Your physician wants you to follow-up in:  6 MONTHS WITH DR NISHAN  You will receive a reminder letter in the mail two months in advance. If you don't receive a letter, please call our office to schedule the follow-up appointment. Your physician recommends that you continue on your current medications as directed. Please refer to the Current Medication list given to you today. 

## 2013-05-11 NOTE — Assessment & Plan Note (Signed)
Maint NSR with no recurrence on beta blocker.

## 2013-05-11 NOTE — Assessment & Plan Note (Signed)
11/13 recent effusion - drained

## 2013-05-11 NOTE — Progress Notes (Signed)
Patient ID: MAEJOR ERVEN, male   DOB: [birth year], 62 y.o.   MRN: [id number] Mr. Wherry is a 62 yo man with PMH of prior DVT/PE on warfarin  dyslipidemia, obesity with BMI 38, chronic left knee osteoarthritis with a fairly recent TKA (end of September) now s/p I&D of left knee hematoma October had an SVT of 150-160s  In hospital had adenosine.  Started on beta blockade with no furterh issues  Having issues with right knee now.  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 Wall thickness was increased in a pattern of mild LVH. Systolic function was normal. The estimated ejection fraction was in the range of 55% to 60%. - Aortic valve: There was very mild stenosis. - Left atrium: The atrium was mildly dilated. - Atrial septum: No defect or patent foramen ovale was identified.    ROS: Denies fever, malais, weight loss, blurry vision, decreased visual acuity, cough, sputum, SOB, hemoptysis, pleuritic pain, palpitaitons, heartburn, abdominal pain, melena, lower extremity edema, claudication, or rash.  All other systems reviewed and negative  General: Affect appropriate Obese white male  HEENT: normal Neck supple with no adenopathy JVP normal no bruits no thyromegaly Lungs clear with no wheezing and good diaphragmatic motion Heart:  S1/S2 no murmur, no rub, gallop or click PMI normal Abdomen: benighn, BS positve, no tenderness, no AAA no bruit.  No HSM or HJR Distal pulses intact with no bruits Plus one bilateral  Neuro non-focal Skin warm and dry S/P TKR with quad weakness bilaterally Ambulating with walker   Current Outpatient Prescriptions  Medication Sig Dispense Refill  . albuterol (PROVENTIL HFA;VENTOLIN HFA) 108 (90 BASE) MCG/ACT inhaler Inhale 2 puffs into the lungs every 6 (six) hours as needed for wheezing.      . cetirizine (ZYRTEC) 10 MG  tablet Take 10 mg by mouth daily.       . Cyanocobalamin (VITAMIN B-12) 5000 MCG SUBL Place 5,000 tablets under the tongue every morning.      . diazepam (VALIUM) 5 MG tablet Take 1 tablet (5 mg total) by mouth every 6 (six) hours as needed.  60 tablet  0  . fish oil-omega-3 fatty acids 1000 MG capsule Take 2 g by mouth every morning.      [name] glucosamine-chondroitin 500-400 MG tablet Take 1 tablet by mouth every morning.      [name] HYDROmorphone (DILAUDID) 2 MG tablet Take 1-2 tablets (2-4 mg total) by mouth every 3 (three) hours as needed for pain.  80 tablet  0  . methocarbamol (ROBAXIN) 500 MG tablet Take 1 tablet (500 mg total) by mouth every 6 (six) hours as needed.  80 tablet  0  . metoprolol (LOPRESSOR) 50 MG tablet Take 1 tablet (50 mg total) by mouth 2 (two) times daily.  60 tablet  12  . nortriptyline (PAMELOR) 10 MG capsule Take 10 mg by mouth every evening.      [name] oxyCODONE (OXY IR/ROXICODONE) 5 MG immediate release tablet as directed.      . warfarin (COUMADIN) 6 MG tablet Take 6 mg by mouth daily. Take 3mg  (1/2 tablet) on fridays, all other days take 6 mg ( 1 tablet)       No current facility-administered medications for this visit.    Allergies  Exalgo; Fentanyl; Hydrocodone-acetaminophen; Penicillins; Sulfonamide derivatives; and Tylenol  Electrocardiogram:  11/16 SR  rate 79 normal  Assessment and Plan

## 2013-05-13 ENCOUNTER — Ambulatory Visit: Payer: 59 | Admitting: Internal Medicine

## 2013-05-26 ENCOUNTER — Telehealth: Payer: Self-pay | Admitting: General Practice

## 2013-05-26 ENCOUNTER — Encounter: Payer: Self-pay | Admitting: Internal Medicine

## 2013-05-26 DIAGNOSIS — R05 Cough: Secondary | ICD-10-CM

## 2013-05-26 DIAGNOSIS — R059 Cough, unspecified: Secondary | ICD-10-CM

## 2013-05-26 DIAGNOSIS — R9389 Abnormal findings on diagnostic imaging of other specified body structures: Secondary | ICD-10-CM

## 2013-05-26 NOTE — Telephone Encounter (Signed)
Returned call to patient.  LMOVM

## 2013-05-27 ENCOUNTER — Ambulatory Visit (INDEPENDENT_AMBULATORY_CARE_PROVIDER_SITE_OTHER): Payer: 59 | Admitting: General Practice

## 2013-05-27 DIAGNOSIS — Z7901 Long term (current) use of anticoagulants: Secondary | ICD-10-CM

## 2013-05-27 DIAGNOSIS — I80299 Phlebitis and thrombophlebitis of other deep vessels of unspecified lower extremity: Secondary | ICD-10-CM

## 2013-05-27 DIAGNOSIS — I2699 Other pulmonary embolism without acute cor pulmonale: Secondary | ICD-10-CM

## 2013-05-27 LAB — POCT INR: INR: 3.1

## 2013-05-27 NOTE — Progress Notes (Signed)
Pre-visit discussion using our clinic review tool. No additional management support is needed unless otherwise documented below in the visit note.  

## 2013-06-01 ENCOUNTER — Ambulatory Visit (INDEPENDENT_AMBULATORY_CARE_PROVIDER_SITE_OTHER)
Admission: RE | Admit: 2013-06-01 | Discharge: 2013-06-01 | Disposition: A | Discharge status: Home / Self Care | Payer: 59 | Source: Ambulatory Visit | Attending: Internal Medicine | Admitting: Internal Medicine

## 2013-06-01 DIAGNOSIS — R05 Cough: Secondary | ICD-10-CM

## 2013-06-01 DIAGNOSIS — R9389 Abnormal findings on diagnostic imaging of other specified body structures: Secondary | ICD-10-CM

## 2013-06-01 DIAGNOSIS — R059 Cough, unspecified: Secondary | ICD-10-CM

## 2013-06-01 MED ORDER — IOHEXOL 300 MG/ML  SOLN
80.0000 mL | Freq: Once | INTRAMUSCULAR | Status: AC | PRN
  Administered 2013-06-01: 80 mL via INTRAVENOUS

## 2013-06-02 ENCOUNTER — Encounter: Payer: Self-pay | Admitting: Internal Medicine

## 2013-06-03 ENCOUNTER — Other Ambulatory Visit: Payer: Self-pay | Admitting: Internal Medicine

## 2013-06-03 MED ORDER — UMECLIDINIUM-VILANTEROL 62.5-25 MCG/INH IN AEPB: 1.0000 | INHALATION_SPRAY | Freq: Every day | RESPIRATORY_TRACT | Status: DC

## 2013-06-18 ENCOUNTER — Encounter: Payer: Self-pay | Admitting: Internal Medicine

## 2013-06-24 ENCOUNTER — Ambulatory Visit (INDEPENDENT_AMBULATORY_CARE_PROVIDER_SITE_OTHER): Payer: 59 | Admitting: General Practice

## 2013-06-24 DIAGNOSIS — I80299 Phlebitis and thrombophlebitis of other deep vessels of unspecified lower extremity: Secondary | ICD-10-CM

## 2013-06-24 DIAGNOSIS — Z7901 Long term (current) use of anticoagulants: Secondary | ICD-10-CM

## 2013-06-24 DIAGNOSIS — I2699 Other pulmonary embolism without acute cor pulmonale: Secondary | ICD-10-CM

## 2013-06-24 LAB — POCT INR: INR: 3.4

## 2013-06-24 NOTE — Progress Notes (Signed)
Pre-visit discussion using our clinic review tool. No additional management support is needed unless otherwise documented below in the visit note.  

## 2013-06-28 ENCOUNTER — Encounter: Payer: Self-pay | Admitting: Internal Medicine

## 2013-06-28 ENCOUNTER — Ambulatory Visit (INDEPENDENT_AMBULATORY_CARE_PROVIDER_SITE_OTHER): Payer: 59 | Admitting: Internal Medicine

## 2013-06-28 ENCOUNTER — Ambulatory Visit (INDEPENDENT_AMBULATORY_CARE_PROVIDER_SITE_OTHER): Payer: 59 | Admitting: *Deleted

## 2013-06-28 VITALS — BP 134/74 | HR 76 | Temp 98.5°F | Resp 16 | Wt 280.0 lb

## 2013-06-28 DIAGNOSIS — J45909 Unspecified asthma, uncomplicated: Secondary | ICD-10-CM

## 2013-06-28 DIAGNOSIS — R300 Dysuria: Secondary | ICD-10-CM

## 2013-06-28 DIAGNOSIS — R945 Abnormal results of liver function studies: Secondary | ICD-10-CM

## 2013-06-28 DIAGNOSIS — Z7901 Long term (current) use of anticoagulants: Secondary | ICD-10-CM

## 2013-06-28 DIAGNOSIS — R7989 Other specified abnormal findings of blood chemistry: Secondary | ICD-10-CM

## 2013-06-28 DIAGNOSIS — D649 Anemia, unspecified: Secondary | ICD-10-CM

## 2013-06-28 DIAGNOSIS — R911 Solitary pulmonary nodule: Secondary | ICD-10-CM

## 2013-06-28 DIAGNOSIS — E669 Obesity, unspecified: Secondary | ICD-10-CM

## 2013-06-28 DIAGNOSIS — E538 Deficiency of other specified B group vitamins: Secondary | ICD-10-CM

## 2013-06-28 DIAGNOSIS — R079 Chest pain, unspecified: Secondary | ICD-10-CM

## 2013-06-28 LAB — CBC WITH DIFFERENTIAL/PLATELET
Basophils Absolute: 0.1 10*3/uL (ref 0.0–0.1)
Basophils Relative: 0.3 % (ref 0.0–3.0)
Eosinophils Absolute: 0.3 10*3/uL (ref 0.0–0.7)
Eosinophils Relative: 1.6 % (ref 0.0–5.0)
HCT: 42.2 % (ref 39.0–52.0)
Hemoglobin: 14.6 g/dL (ref 13.0–17.0)
Lymphocytes Relative: 12.9 % (ref 12.0–46.0)
Lymphs Abs: 2.3 10*3/uL (ref 0.7–4.0)
MCHC: 34.5 g/dL (ref 30.0–36.0)
MCV: 87.6 fl (ref 78.0–100.0)
Monocytes Absolute: 1.4 10*3/uL — ABNORMAL HIGH (ref 0.1–1.0)
Monocytes Relative: 8.2 % (ref 3.0–12.0)
Neutro Abs: 13.6 10*3/uL — ABNORMAL HIGH (ref 1.4–7.7)
Neutrophils Relative %: 77 % (ref 43.0–77.0)
Platelets: 313 10*3/uL (ref 150.0–400.0)
RBC: 4.82 Mil/uL (ref 4.22–5.81)
RDW: 14.8 % — ABNORMAL HIGH (ref 11.5–14.6)
WBC: 17.7 10*3/uL — ABNORMAL HIGH (ref 4.5–10.5)

## 2013-06-28 LAB — URINALYSIS, ROUTINE W REFLEX MICROSCOPIC
Bilirubin Urine: NEGATIVE
Ketones, ur: NEGATIVE
Nitrite: POSITIVE — AB
Specific Gravity, Urine: 1.01 (ref 1.000–1.030)
Total Protein, Urine: 30 — AB
Urine Glucose: NEGATIVE
Urobilinogen, UA: 0.2 (ref 0.0–1.0)
pH: 6.5 (ref 5.0–8.0)

## 2013-06-28 LAB — IBC PANEL
Iron: 23 ug/dL — ABNORMAL LOW (ref 42–165)
Saturation Ratios: 6.8 % — ABNORMAL LOW (ref 20.0–50.0)
Transferrin: 241 mg/dL (ref 212.0–360.0)

## 2013-06-28 LAB — BASIC METABOLIC PANEL
BUN: 9 mg/dL (ref 6–23)
CO2: 26 mEq/L (ref 19–32)
Calcium: 8.9 mg/dL (ref 8.4–10.5)
Chloride: 98 mEq/L (ref 96–112)
Creatinine, Ser: 1.1 mg/dL (ref 0.4–1.5)
GFR: 72.91 mL/min (ref >60.00)
Glucose, Bld: 98 mg/dL (ref 70–99)
Potassium: 4 mEq/L (ref 3.5–5.1)
Sodium: 132 mEq/L — ABNORMAL LOW (ref 135–145)

## 2013-06-28 LAB — TSH: TSH: 3.53 u[IU]/mL (ref 0.35–5.50)

## 2013-06-28 MED ORDER — ALBUTEROL SULFATE HFA 108 (90 BASE) MCG/ACT IN AERS: 2.0000 | INHALATION_SPRAY | Freq: Four times a day (QID) | RESPIRATORY_TRACT | Status: DC | PRN

## 2013-06-28 MED ORDER — LEVOFLOXACIN 500 MG PO TABS: 500.0000 mg | ORAL_TABLET | Freq: Every day | ORAL | Status: DC

## 2013-06-28 NOTE — Assessment & Plan Note (Signed)
Wt Readings from Last 3 Encounters:  06/28/13 280 lb (127.007 kg)  05/11/13 271 lb (122.925 kg)  05/11/13 271 lb (122.925 kg)  Loose wt

## 2013-06-28 NOTE — Progress Notes (Signed)
Subjective:    HPI  C/o CP after he was finished carrying wood - pressure type.   C/o painful urination x 2 d, thick urine  Robert [name] is a 63 yo man with PMH of prior DVT/PE on warfarin dyslipidemia, obesity with BMI 38, chronic left knee osteoarthritis with a fairly recent TKA (end of September) now s/p I&D of left knee hematoma October had an SVT of 150-160s In hospital had adenosine. Started on beta blockade with no furterh issues Having issues with right knee - R knee was tapped again recently.    Right Lower Extremity Venous Duplex Evaluation 05/07/13   Summary:  - Findings consistent with chronic deep vein thrombosis involving the common femoral, femoral, popliteal, and posterior tibial veins of theright lower extremity. There is fluid noted in the right anterior knee as well as an enlarged lymph node in the right groin. - No evidence of Baker's cyst on the right. Other specific details can be found in the table(s) above. Prepared and Electronically Authenticated by    The patient is here to follow up on chronic severe to moderate LBP/OA symptoms controlled partially with medicines (oxycodone), hand pain, B12 def, h/o PE/DVT  F/u LBP and LLE pain 5-6/10; it is still 10/10 in am     BP Readings from Last 3 Encounters:  06/28/13 134/74  05/11/13 116/70  05/11/13 118/58   Wt Readings from Last 3 Encounters:  06/28/13 280 lb (127.007 kg)  05/11/13 271 lb (122.925 kg)  05/11/13 271 lb (122.925 kg)      Review of Systems  Constitutional: Positive for fatigue.  HENT: Positive for congestion, postnasal drip and rhinorrhea. Negative for sinus pressure.   Respiratory: Positive for cough. Negative for chest tightness, shortness of breath and wheezing.   Cardiovascular: Positive for leg swelling.  Gastrointestinal: Negative for abdominal pain.  Genitourinary: Negative for frequency.  Musculoskeletal: Positive for arthralgias and back pain. Negative for  myalgias.  Neurological: Negative for syncope.  Psychiatric/Behavioral: Positive for sleep disturbance. Negative for suicidal ideas and dysphoric mood. The patient is not nervous/anxious.        Objective:   Physical Exam  Constitutional: He is oriented to person, place, and time. He appears well-developed.  obese  HENT:  Mouth/Throat: Oropharynx is clear and moist.  Eyes: Conjunctivae are normal. Pupils are equal, round, and reactive to light.  Neck: Normal range of motion. No JVD present. No thyromegaly present.  Cardiovascular: Normal rate, regular rhythm, normal heart sounds and intact distal pulses.  Exam reveals no gallop and no friction rub.   No murmur heard. Pulmonary/Chest: Effort normal and breath sounds normal. No respiratory distress. He has no wheezes. He has no rales. He exhibits no tenderness.  Abdominal: Soft. Bowel sounds are normal. He exhibits no distension and no mass. There is no tenderness. There is no rebound and no guarding.  Musculoskeletal: Normal range of motion. He exhibits edema and tenderness (LS spine).  Lymphadenopathy:    He has no cervical adenopathy.  Neurological: He is alert and oriented to person, place, and time. He has normal reflexes. No cranial nerve deficit. He exhibits normal muscle tone. Coordination normal.  Skin: Skin is warm and dry. No rash noted.  Psychiatric: He has a normal mood and affect. His behavior is normal. Judgment and thought content normal.  chest NT  Lab Results  Component Value Date   WBC 9.0 05/07/2013   HGB 11.9* 05/07/2013   HCT 34.9* 05/07/2013   PLT 343 05/07/2013  GLUCOSE 112* 05/07/2013   CHOL 218* 03/09/2013   TRIG 145.0 03/09/2013   HDL 26.20* 03/09/2013   LDLDIRECT 168.1 03/09/2013   ALT 9 05/07/2013   AST 22 05/07/2013   NA 134* 05/07/2013   K 4.3 05/07/2013   CL 98 05/07/2013   CREATININE 0.74 05/07/2013   BUN 10 05/07/2013   CO2 26 05/07/2013   TSH 2.619 04/05/2013   PSA 0.13 12/14/2012   INR 3.4  06/24/2013   HGBA1C 5.3 04/19/2007   A complex case  EKG    Assessment & Plan:

## 2013-06-28 NOTE — Assessment & Plan Note (Signed)
Continue with current prescription therapy as reflected on the Med list.  

## 2013-06-28 NOTE — Assessment & Plan Note (Signed)
Anticoagulated

## 2013-06-28 NOTE — Assessment & Plan Note (Signed)
11/14 post-op Labs

## 2013-06-28 NOTE — Progress Notes (Signed)
Pre visit review using our clinic review tool, if applicable. No additional management support is needed unless otherwise documented below in the visit note. 

## 2013-06-28 NOTE — Assessment & Plan Note (Addendum)
Possibly MSK Check CKMB and a d-dimer EKG 12/14 chest CT: IMPRESSION:  1. No acute chest findings or explanation for cough identified.  2. A right lower lobe pulmonary nodule has not significantly changed  from the prior studies, consistent with a benign finding.  3. 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

## 2013-06-28 NOTE — Assessment & Plan Note (Signed)
UA, Cx 

## 2013-06-28 NOTE — Assessment & Plan Note (Signed)
Labs

## 2013-06-28 NOTE — Assessment & Plan Note (Signed)
D/c Breo and Anoro - intolerant Cont Proair

## 2013-06-29 ENCOUNTER — Encounter: Payer: Self-pay | Admitting: Internal Medicine

## 2013-06-29 LAB — CK TOTAL AND CKMB (NOT AT ARMC)
CK, MB: <0.7 ng/mL (ref 0.0–5.0)
Total CK: 47 U/L (ref 7–232)

## 2013-06-29 LAB — D-DIMER, QUANTITATIVE (NOT AT ARMC): D-Dimer, Quant: 1.11 ug/mL-FEU — ABNORMAL HIGH (ref 0.00–0.48)

## 2013-06-29 NOTE — Telephone Encounter (Signed)
Robert [name], please, inform patient of his labs/tests being good, except for a D-dimer - a "clot" test. If he is still having a chest pain - we should order a chest CT.   Please let me know.  Sincerely,  Walker Kehr, MD

## 2013-06-30 ENCOUNTER — Encounter: Payer: Self-pay | Admitting: Internal Medicine

## 2013-06-30 ENCOUNTER — Other Ambulatory Visit: Payer: Self-pay | Admitting: Internal Medicine

## 2013-06-30 DIAGNOSIS — R0602 Shortness of breath: Secondary | ICD-10-CM

## 2013-06-30 DIAGNOSIS — R079 Chest pain, unspecified: Secondary | ICD-10-CM

## 2013-06-30 LAB — CULTURE, URINE COMPREHENSIVE: Colony Count: >=100000

## 2013-06-30 MED ORDER — NITROFURANTOIN MONOHYD MACRO 100 MG PO CAPS: 100.0000 mg | ORAL_CAPSULE | Freq: Two times a day (BID) | ORAL | Status: DC

## 2013-07-01 ENCOUNTER — Other Ambulatory Visit: Payer: Self-pay | Admitting: Internal Medicine

## 2013-07-01 DIAGNOSIS — M79604 Pain in right leg: Secondary | ICD-10-CM

## 2013-07-01 DIAGNOSIS — R109 Unspecified abdominal pain: Secondary | ICD-10-CM

## 2013-07-04 ENCOUNTER — Ambulatory Visit (INDEPENDENT_AMBULATORY_CARE_PROVIDER_SITE_OTHER)
Admission: RE | Admit: 2013-07-04 | Discharge: 2013-07-04 | Disposition: A | Discharge status: Home / Self Care | Payer: 59 | Source: Ambulatory Visit | Attending: Internal Medicine | Admitting: Internal Medicine

## 2013-07-04 DIAGNOSIS — R0602 Shortness of breath: Secondary | ICD-10-CM

## 2013-07-04 DIAGNOSIS — M79609 Pain in unspecified limb: Secondary | ICD-10-CM

## 2013-07-04 DIAGNOSIS — R079 Chest pain, unspecified: Secondary | ICD-10-CM

## 2013-07-04 DIAGNOSIS — R109 Unspecified abdominal pain: Secondary | ICD-10-CM

## 2013-07-04 DIAGNOSIS — M79604 Pain in right leg: Secondary | ICD-10-CM

## 2013-07-04 MED ORDER — IOHEXOL 350 MG/ML SOLN
100.0000 mL | Freq: Once | INTRAVENOUS | Status: AC | PRN
  Administered 2013-07-04: 100 mL via INTRAVENOUS

## 2013-07-13 ENCOUNTER — Encounter: Payer: Self-pay | Admitting: Internal Medicine

## 2013-07-13 ENCOUNTER — Other Ambulatory Visit (INDEPENDENT_AMBULATORY_CARE_PROVIDER_SITE_OTHER): Payer: 59

## 2013-07-13 ENCOUNTER — Ambulatory Visit (INDEPENDENT_AMBULATORY_CARE_PROVIDER_SITE_OTHER): Payer: 59 | Admitting: Internal Medicine

## 2013-07-13 VITALS — BP 130/82 | HR 72 | Temp 97.7°F | Resp 16 | Wt 274.0 lb

## 2013-07-13 DIAGNOSIS — M79609 Pain in unspecified limb: Secondary | ICD-10-CM

## 2013-07-13 DIAGNOSIS — E538 Deficiency of other specified B group vitamins: Secondary | ICD-10-CM

## 2013-07-13 DIAGNOSIS — R300 Dysuria: Secondary | ICD-10-CM

## 2013-07-13 DIAGNOSIS — M79651 Pain in right thigh: Secondary | ICD-10-CM | POA: Insufficient documentation

## 2013-07-13 DIAGNOSIS — E669 Obesity, unspecified: Secondary | ICD-10-CM

## 2013-07-13 DIAGNOSIS — J45909 Unspecified asthma, uncomplicated: Secondary | ICD-10-CM

## 2013-07-13 LAB — URINALYSIS, ROUTINE W REFLEX MICROSCOPIC
Bilirubin Urine: NEGATIVE
Ketones, ur: NEGATIVE
Leukocytes, UA: NEGATIVE
Nitrite: NEGATIVE
Specific Gravity, Urine: >=1.03 — AB (ref 1.000–1.030)
Urine Glucose: NEGATIVE
Urobilinogen, UA: 0.2 (ref 0.0–1.0)
pH: 5.5 (ref 5.0–8.0)

## 2013-07-13 LAB — CBC WITH DIFFERENTIAL/PLATELET
Basophils Absolute: 0.1 10*3/uL (ref 0.0–0.1)
Basophils Relative: 0.8 % (ref 0.0–3.0)
Eosinophils Absolute: 0.6 10*3/uL (ref 0.0–0.7)
Eosinophils Relative: 6.8 % — ABNORMAL HIGH (ref 0.0–5.0)
HCT: 43.7 % (ref 39.0–52.0)
Hemoglobin: 14.8 g/dL (ref 13.0–17.0)
Lymphocytes Relative: 29 % (ref 12.0–46.0)
Lymphs Abs: 2.7 10*3/uL (ref 0.7–4.0)
MCHC: 33.8 g/dL (ref 30.0–36.0)
MCV: 87.4 fl (ref 78.0–100.0)
Monocytes Absolute: 1 10*3/uL (ref 0.1–1.0)
Monocytes Relative: 10.4 % (ref 3.0–12.0)
Neutro Abs: 5 10*3/uL (ref 1.4–7.7)
Neutrophils Relative %: 53 % (ref 43.0–77.0)
Platelets: 375 10*3/uL (ref 150.0–400.0)
RBC: 5.01 Mil/uL (ref 4.22–5.81)
RDW: 14.9 % — ABNORMAL HIGH (ref 11.5–14.6)
WBC: 9.3 10*3/uL (ref 4.5–10.5)

## 2013-07-13 LAB — BASIC METABOLIC PANEL
BUN: 10 mg/dL (ref 6–23)
CO2: 28 mEq/L (ref 19–32)
Calcium: 8.9 mg/dL (ref 8.4–10.5)
Chloride: 102 mEq/L (ref 96–112)
Creatinine, Ser: 0.8 mg/dL (ref 0.4–1.5)
GFR: 98.47 mL/min (ref >60.00)
Glucose, Bld: 86 mg/dL (ref 70–99)
Potassium: 4.2 mEq/L (ref 3.5–5.1)
Sodium: 136 mEq/L (ref 135–145)

## 2013-07-13 MED ORDER — AZITHROMYCIN 250 MG PO TABS: ORAL_TABLET | ORAL | Status: DC

## 2013-07-13 MED ORDER — OXYCODONE HCL ER 10 MG PO T12A: 10.0000 mg | EXTENDED_RELEASE_TABLET | Freq: Every morning | ORAL | Status: DC

## 2013-07-13 NOTE — Assessment & Plan Note (Signed)
1/15 chronic - better

## 2013-07-13 NOTE — Assessment & Plan Note (Signed)
Proventil helps Better

## 2013-07-13 NOTE — Assessment & Plan Note (Signed)
Continue with current prescription therapy as reflected on the Med list.  

## 2013-07-13 NOTE — Assessment & Plan Note (Signed)
Better  

## 2013-07-13 NOTE — Progress Notes (Signed)
Pre visit review using our clinic review tool, if applicable. No additional management support is needed unless otherwise documented below in the visit note. 

## 2013-07-13 NOTE — Progress Notes (Signed)
Subjective:    HPI  F/u CP after he was finished carrying wood - pressure type.   F/u painful urination x 2 d, thick urine - better  Robert [name] is a 63 yo man with PMH of prior DVT/PE on warfarin dyslipidemia, obesity with BMI 38, chronic left knee osteoarthritis with a fairly recent TKA (end of September) now s/p I&D of left knee hematoma October had an SVT of 150-160s In hospital had adenosine. Started on beta blockade with no furterh issues Having issues with right knee - R knee was tapped again recently.    Right Lower Extremity Venous Duplex Evaluation 05/07/13   Summary:  - Findings consistent with chronic deep vein thrombosis involving the common femoral, femoral, popliteal, and posterior tibial veins of theright lower extremity. There is fluid noted in the right anterior knee as well as an enlarged lymph node in the right groin. - No evidence of Baker's cyst on the right. Other specific details can be found in the table(s) above. Prepared and Electronically Authenticated by    The patient is here to follow up on chronic severe to moderate LBP/OA symptoms controlled partially with medicines (oxycodone), hand pain, B12 def, h/o PE/DVT  F/u LBP and LLE pain 5-6/10; it is still 10/10 in am     BP Readings from Last 3 Encounters:  07/13/13 130/82  06/28/13 134/74  05/11/13 116/70   Wt Readings from Last 3 Encounters:  07/13/13 274 lb (124.286 kg)  06/28/13 280 lb (127.007 kg)  05/11/13 271 lb (122.925 kg)      Review of Systems  Constitutional: Positive for fatigue.  HENT: Positive for congestion, postnasal drip and rhinorrhea. Negative for sinus pressure.   Respiratory: Positive for cough. Negative for chest tightness, shortness of breath and wheezing.   Cardiovascular: Positive for leg swelling.  Gastrointestinal: Negative for abdominal pain.  Genitourinary: Negative for frequency.  Musculoskeletal: Positive for arthralgias and back pain. Negative for  myalgias.  Neurological: Negative for syncope.  Psychiatric/Behavioral: Positive for sleep disturbance. Negative for suicidal ideas and dysphoric mood. The patient is not nervous/anxious.        Objective:   Physical Exam  Constitutional: He is oriented to person, place, and time. He appears well-developed.  obese  HENT:  Mouth/Throat: Oropharynx is clear and moist.  Eyes: Conjunctivae are normal. Pupils are equal, round, and reactive to light.  Neck: Normal range of motion. No JVD present. No thyromegaly present.  Cardiovascular: Normal rate, regular rhythm, normal heart sounds and intact distal pulses.  Exam reveals no gallop and no friction rub.   No murmur heard. Pulmonary/Chest: Effort normal and breath sounds normal. No respiratory distress. He has no wheezes. He has no rales. He exhibits no tenderness.  Abdominal: Soft. Bowel sounds are normal. He exhibits no distension and no mass. There is no tenderness. There is no rebound and no guarding.  Musculoskeletal: Normal range of motion. He exhibits edema and tenderness (LS spine).  Lymphadenopathy:    He has no cervical adenopathy.  Neurological: He is alert and oriented to person, place, and time. He has normal reflexes. No cranial nerve deficit. He exhibits normal muscle tone. Coordination normal.  Skin: Skin is warm and dry. No rash noted.  Psychiatric: He has a normal mood and affect. His behavior is normal. Judgment and thought content normal.  chest NT  Lab Results  Component Value Date   WBC 17.7* 06/28/2013   HGB 14.6 06/28/2013   HCT 42.2 06/28/2013   PLT  313.0 06/28/2013   GLUCOSE 98 06/28/2013   CHOL 218* 03/09/2013   TRIG 145.0 03/09/2013   HDL 26.20* 03/09/2013   LDLDIRECT 168.1 03/09/2013   ALT 9 05/07/2013   AST 22 05/07/2013   NA 132* 06/28/2013   K 4.0 06/28/2013   CL 98 06/28/2013   CREATININE 1.1 06/28/2013   BUN 9 06/28/2013   CO2 26 06/28/2013   TSH 3.53 06/28/2013   PSA 0.13 12/14/2012   INR 3.4 06/24/2013   HGBA1C 5.3  04/19/2007     EKG    Assessment & Plan:

## 2013-07-17 ENCOUNTER — Encounter: Payer: Self-pay | Admitting: Internal Medicine

## 2013-07-18 ENCOUNTER — Encounter: Payer: Self-pay | Admitting: Internal Medicine

## 2013-07-18 ENCOUNTER — Other Ambulatory Visit: Payer: Self-pay | Admitting: Internal Medicine

## 2013-07-18 NOTE — Telephone Encounter (Signed)
Can he take Ceftin (cefuroxime) ok? Thx

## 2013-07-18 NOTE — Telephone Encounter (Signed)
Requesting another round of antibiotics for the UTI or kidney infection.  Pt sent an e-mail.

## 2013-07-18 NOTE — Telephone Encounter (Signed)
He can't take sulfa drugs per [place].

## 2013-07-19 ENCOUNTER — Other Ambulatory Visit: Payer: Self-pay | Admitting: Internal Medicine

## 2013-07-19 MED ORDER — CEFUROXIME AXETIL 500 MG PO TABS: 500.0000 mg | ORAL_TABLET | Freq: Two times a day (BID) | ORAL | Status: DC

## 2013-07-19 NOTE — Telephone Encounter (Signed)
Ok Ceftin Thx

## 2013-07-20 NOTE — Telephone Encounter (Signed)
Pts wife informed.

## 2013-07-22 ENCOUNTER — Ambulatory Visit (INDEPENDENT_AMBULATORY_CARE_PROVIDER_SITE_OTHER): Payer: 59 | Admitting: General Practice

## 2013-07-22 DIAGNOSIS — Z7901 Long term (current) use of anticoagulants: Secondary | ICD-10-CM

## 2013-07-22 DIAGNOSIS — I80299 Phlebitis and thrombophlebitis of other deep vessels of unspecified lower extremity: Secondary | ICD-10-CM

## 2013-07-22 DIAGNOSIS — I2699 Other pulmonary embolism without acute cor pulmonale: Secondary | ICD-10-CM

## 2013-07-22 DIAGNOSIS — Z5181 Encounter for therapeutic drug level monitoring: Secondary | ICD-10-CM | POA: Insufficient documentation

## 2013-07-22 LAB — POCT INR: INR: 3.4

## 2013-07-22 NOTE — Progress Notes (Signed)
Pre-visit discussion using our clinic review tool. No additional management support is needed unless otherwise documented below in the visit note.  

## 2013-08-19 ENCOUNTER — Ambulatory Visit (INDEPENDENT_AMBULATORY_CARE_PROVIDER_SITE_OTHER): Payer: 59 | Admitting: Internal Medicine

## 2013-08-19 ENCOUNTER — Encounter: Payer: Self-pay | Admitting: Internal Medicine

## 2013-08-19 ENCOUNTER — Ambulatory Visit (INDEPENDENT_AMBULATORY_CARE_PROVIDER_SITE_OTHER): Payer: 59 | Admitting: General Practice

## 2013-08-19 VITALS — BP 130/90 | HR 76 | Temp 97.5°F | Resp 16 | Wt 283.0 lb

## 2013-08-19 DIAGNOSIS — J45909 Unspecified asthma, uncomplicated: Secondary | ICD-10-CM

## 2013-08-19 DIAGNOSIS — Z23 Encounter for immunization: Secondary | ICD-10-CM

## 2013-08-19 DIAGNOSIS — E538 Deficiency of other specified B group vitamins: Secondary | ICD-10-CM

## 2013-08-19 DIAGNOSIS — M545 Low back pain, unspecified: Secondary | ICD-10-CM

## 2013-08-19 DIAGNOSIS — Z5181 Encounter for therapeutic drug level monitoring: Secondary | ICD-10-CM

## 2013-08-19 DIAGNOSIS — I471 Supraventricular tachycardia: Secondary | ICD-10-CM

## 2013-08-19 DIAGNOSIS — E669 Obesity, unspecified: Secondary | ICD-10-CM

## 2013-08-19 DIAGNOSIS — M009 Pyogenic arthritis, unspecified: Secondary | ICD-10-CM

## 2013-08-19 LAB — POCT INR: INR: 2.8

## 2013-08-19 NOTE — Progress Notes (Signed)
Subjective:    HPI  F/u LBP and L leg pain - not better - he stopped Oxycontin - no help  F/u CP after he was finished carrying wood - pressure type - no relapse.   F/u painful urination x 2 d, thick urine - resolved  Robert [name] is a 63 yo man with PMH of prior DVT/PE on warfarin dyslipidemia, obesity with BMI 38, chronic left knee osteoarthritis with a fairly recent TKA (end of September) now s/p I&D of left knee hematoma October had an SVT of 150-160s In hospital had adenosine. Started on beta blockade with no furterh issues Having issues with right knee - R knee was tapped again recently.    Right Lower Extremity Venous Duplex Evaluation 05/07/13   Summary:  - Findings consistent with chronic deep vein thrombosis involving the common femoral, femoral, popliteal, and posterior tibial veins of theright lower extremity. There is fluid noted in the right anterior knee as well as an enlarged lymph node in the right groin. - No evidence of Baker's cyst on the right. Other specific details can be found in the table(s) above. Prepared and Electronically Authenticated by    The patient is here to follow up on chronic severe to moderate LBP/OA symptoms controlled partially with medicines (oxycodone), hand pain, B12 def, h/o PE/DVT  F/u LBP and LLE pain 5-6/10; it is still 10/10 in am     BP Readings from Last 3 Encounters:  08/19/13 130/90  07/13/13 130/82  06/28/13 134/74   Wt Readings from Last 3 Encounters:  08/19/13 283 lb (128.368 kg)  07/13/13 274 lb (124.286 kg)  06/28/13 280 lb (127.007 kg)      Review of Systems  Constitutional: Positive for fatigue.  HENT: Positive for congestion, postnasal drip and rhinorrhea. Negative for sinus pressure.   Respiratory: Positive for cough. Negative for chest tightness, shortness of breath and wheezing.   Cardiovascular: Positive for leg swelling.  Gastrointestinal: Negative for abdominal pain.  Genitourinary: Negative  for frequency.  Musculoskeletal: Positive for arthralgias and back pain. Negative for myalgias.  Neurological: Negative for syncope.  Psychiatric/Behavioral: Positive for sleep disturbance. Negative for suicidal ideas and dysphoric mood. The patient is not nervous/anxious.        Objective:   Physical Exam  Constitutional: He is oriented to person, place, and time. He appears well-developed.  obese  HENT:  Mouth/Throat: Oropharynx is clear and moist.  Eyes: Conjunctivae are normal. Pupils are equal, round, and reactive to light.  Neck: Normal range of motion. No JVD present. No thyromegaly present.  Cardiovascular: Normal rate, regular rhythm, normal heart sounds and intact distal pulses.  Exam reveals no gallop and no friction rub.   No murmur heard. Pulmonary/Chest: Effort normal and breath sounds normal. No respiratory distress. He has no wheezes. He has no rales. He exhibits no tenderness.  Abdominal: Soft. Bowel sounds are normal. He exhibits no distension and no mass. There is no tenderness. There is no rebound and no guarding.  Musculoskeletal: Normal range of motion. He exhibits edema and tenderness (LS spine).  Lymphadenopathy:    He has no cervical adenopathy.  Neurological: He is alert and oriented to person, place, and time. He has normal reflexes. No cranial nerve deficit. He exhibits normal muscle tone. Coordination normal.  Skin: Skin is warm and dry. No rash noted.  Psychiatric: He has a normal mood and affect. His behavior is normal. Judgment and thought content normal.  chest NT   EKG  Assessment & Plan:

## 2013-08-19 NOTE — Assessment & Plan Note (Signed)
F/u w/Dr Johnsie Cancel Continue with current prescription therapy as reflected on the Med list.

## 2013-08-19 NOTE — Progress Notes (Signed)
Pre visit review using our clinic review tool, if applicable. No additional management support is needed unless otherwise documented below in the visit note. 

## 2013-08-19 NOTE — Assessment & Plan Note (Signed)
2/15 pt stopped Oxycontin - no help

## 2013-08-19 NOTE — Assessment & Plan Note (Signed)
Recovered  

## 2013-08-19 NOTE — Assessment & Plan Note (Signed)
Wt Readings from Last 3 Encounters:  08/19/13 283 lb (128.368 kg)  07/13/13 274 lb (124.286 kg)  06/28/13 280 lb (127.007 kg)

## 2013-08-19 NOTE — Assessment & Plan Note (Signed)
Continue with current prescription therapy as reflected on the Med list.  

## 2013-08-19 NOTE — Assessment & Plan Note (Signed)
Proair prn

## 2013-09-16 ENCOUNTER — Ambulatory Visit (INDEPENDENT_AMBULATORY_CARE_PROVIDER_SITE_OTHER): Payer: 59 | Admitting: General Practice

## 2013-09-16 DIAGNOSIS — I2699 Other pulmonary embolism without acute cor pulmonale: Secondary | ICD-10-CM

## 2013-09-16 DIAGNOSIS — Z5181 Encounter for therapeutic drug level monitoring: Secondary | ICD-10-CM

## 2013-09-16 DIAGNOSIS — I80299 Phlebitis and thrombophlebitis of other deep vessels of unspecified lower extremity: Secondary | ICD-10-CM

## 2013-09-16 LAB — POCT INR: INR: 3.4

## 2013-09-16 NOTE — Progress Notes (Signed)
Pre visit review using our clinic review tool, if applicable. No additional management support is needed unless otherwise documented below in the visit note. 

## 2013-10-14 ENCOUNTER — Ambulatory Visit (INDEPENDENT_AMBULATORY_CARE_PROVIDER_SITE_OTHER): Payer: 59 | Admitting: General Practice

## 2013-10-14 DIAGNOSIS — I2699 Other pulmonary embolism without acute cor pulmonale: Secondary | ICD-10-CM

## 2013-10-14 DIAGNOSIS — Z5181 Encounter for therapeutic drug level monitoring: Secondary | ICD-10-CM

## 2013-10-14 DIAGNOSIS — I80299 Phlebitis and thrombophlebitis of other deep vessels of unspecified lower extremity: Secondary | ICD-10-CM

## 2013-10-14 LAB — POCT INR: INR: 3.4

## 2013-10-14 NOTE — Progress Notes (Signed)
Pre visit review using our clinic review tool, if applicable. No additional management support is needed unless otherwise documented below in the visit note. 

## 2013-10-31 ENCOUNTER — Other Ambulatory Visit: Payer: Self-pay

## 2013-11-05 ENCOUNTER — Encounter: Payer: Self-pay | Admitting: Internal Medicine

## 2013-11-07 ENCOUNTER — Other Ambulatory Visit: Payer: Self-pay | Admitting: Internal Medicine

## 2013-11-07 MED ORDER — ALBUTEROL SULFATE (2.5 MG/3ML) 0.083% IN NEBU: 2.5000 mg | INHALATION_SOLUTION | Freq: Four times a day (QID) | RESPIRATORY_TRACT | Status: DC | PRN

## 2013-11-09 ENCOUNTER — Ambulatory Visit (INDEPENDENT_AMBULATORY_CARE_PROVIDER_SITE_OTHER): Payer: 59 | Admitting: Cardiovascular Disease

## 2013-11-09 ENCOUNTER — Encounter: Payer: Self-pay | Admitting: Cardiovascular Disease

## 2013-11-09 VITALS — BP 134/74 | HR 73 | Ht 71.0 in

## 2013-11-09 DIAGNOSIS — E785 Hyperlipidemia, unspecified: Secondary | ICD-10-CM

## 2013-11-09 DIAGNOSIS — I350 Nonrheumatic aortic (valve) stenosis: Secondary | ICD-10-CM

## 2013-11-09 DIAGNOSIS — I359 Nonrheumatic aortic valve disorder, unspecified: Secondary | ICD-10-CM

## 2013-11-09 DIAGNOSIS — I80299 Phlebitis and thrombophlebitis of other deep vessels of unspecified lower extremity: Secondary | ICD-10-CM

## 2013-11-09 NOTE — Progress Notes (Signed)
Patient ID: Robert [name], male   DOB: Aug 14, 1950, 63 y.o.   MRN: [id number] Mr. Diebel is a 63 yo man with PMH of prior DVT/PE on warfarin dyslipidemia, obesity with BMI 38, chronic left knee osteoarthritis with a fairly recent TKA (end of September) now s/p I&D of left knee hematoma October had an SVT of 150-160s In hospital had adenosine. Started on beta blockade with no furterh issues Having issues with right knee now. 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 Wall thickness was increased in a pattern of mild LVH. Systolic function was normal. The estimated ejection fraction was in the range of 55% to 60%. - Aortic valve: There was very mild stenosis. - Left atrium: The atrium was mildly dilated. - Atrial septum: No defect or patent foramen ovale was identified.    ROS: Denies fever, malais, weight loss, blurry vision, decreased visual acuity, cough, sputum, SOB, hemoptysis, pleuritic pain, palpitaitons, heartburn, abdominal pain, melena, lower extremity edema, claudication, or rash.  All other systems reviewed and negative  General: Affect appropriate Obese white male  HEENT: normal Neck supple with no adenopathy JVP normal no bruits no thyromegaly Lungs clear with no wheezing and good diaphragmatic motion Heart:  S1/S2 no murmur, no rub, gallop or click PMI normal Abdomen: benighn, BS positve, no tenderness, no AAA no bruit.  No HSM or HJR Distal pulses intact with no bruits Plus one edema  Bilateral TKR  Neuro non-focal Skin warm and dry No muscular weakness   Current Outpatient Prescriptions  Medication Sig Dispense Refill  . albuterol (PROAIR HFA) 108 (90 BASE) MCG/ACT inhaler Inhale 2 puffs into the lungs every 6 (six) hours as needed for wheezing or shortness of breath.  1 Inhaler  6  . albuterol (PROVENTIL) (2.5 MG/3ML) 0.083%  nebulizer solution Take 3 mLs (2.5 mg total) by nebulization every 6 (six) hours as needed for wheezing or shortness of breath.  150 mL  1  . cetirizine (ZYRTEC) 10 MG tablet Take 10 mg by mouth daily.       . Cyanocobalamin (VITAMIN B-12) 5000 MCG SUBL Place 5,000 tablets under the tongue every morning.      . fish oil-omega-3 fatty acids 1000 MG capsule Take 2 g by mouth every morning.      [name] glucosamine-chondroitin 500-400 MG tablet Take 1 tablet by mouth every morning.      . metoprolol (LOPRESSOR) 50 MG tablet Take 1 tablet (50 mg total) by mouth 2 (two) times daily.  60 tablet  12  . nortriptyline (PAMELOR) 10 MG capsule Take 10 mg by mouth every evening.      . warfarin (COUMADIN) 6 MG tablet Take 6 mg by mouth daily. Take 3mg  (1/2 tablet) on fridays, all other days take 6 mg ( 1 tablet)       No current facility-administered medications for this visit.    Allergies  Anoro ellipta; Breo ellipta; Exalgo; Fentanyl; Gabapentin; Hydrocodone-acetaminophen; Penicillins; Sulfonamide derivatives; and Tylenol  Electrocardiogram:  NSR nonspecific St changes   Assessment and Plan

## 2013-11-09 NOTE — Patient Instructions (Signed)
Your physician wants you to follow-up in:  6 MONTHS WITH DR NISHAN  You will receive a reminder letter in the mail two months in advance. If you don't receive a letter, please call our office to schedule the follow-up appointment. Your physician recommends that you continue on your current medications as directed. Please refer to the Current Medication list given to you today. 

## 2013-11-09 NOTE — Assessment & Plan Note (Signed)
Echo 10/14 only mild murmur distant No symptoms consider f/u echo for change in murmur or new symptoms

## 2013-11-09 NOTE — Assessment & Plan Note (Signed)
Cholesterol is at goal.  Continue current dose of statin and diet Rx.  No myalgias or side effects.  F/U  LFT's in 6 months. No results found for this basename: LDLCALC  Labs with primary            

## 2013-11-09 NOTE — Assessment & Plan Note (Signed)
Continue life long anticoagulation given history of PE and obesity with known LE venous DVT

## 2013-11-10 ENCOUNTER — Encounter: Payer: Self-pay | Admitting: Cardiovascular Disease

## 2013-11-18 ENCOUNTER — Ambulatory Visit (INDEPENDENT_AMBULATORY_CARE_PROVIDER_SITE_OTHER): Payer: 59 | Admitting: Internal Medicine

## 2013-11-18 ENCOUNTER — Ambulatory Visit (INDEPENDENT_AMBULATORY_CARE_PROVIDER_SITE_OTHER): Payer: 59 | Admitting: Family Medicine

## 2013-11-18 ENCOUNTER — Other Ambulatory Visit (INDEPENDENT_AMBULATORY_CARE_PROVIDER_SITE_OTHER): Payer: 59

## 2013-11-18 ENCOUNTER — Encounter: Payer: Self-pay | Admitting: Internal Medicine

## 2013-11-18 VITALS — BP 108/58 | HR 76 | Temp 98.3°F | Resp 16 | Wt 296.0 lb

## 2013-11-18 DIAGNOSIS — I2699 Other pulmonary embolism without acute cor pulmonale: Secondary | ICD-10-CM

## 2013-11-18 DIAGNOSIS — E669 Obesity, unspecified: Secondary | ICD-10-CM

## 2013-11-18 DIAGNOSIS — J45909 Unspecified asthma, uncomplicated: Secondary | ICD-10-CM

## 2013-11-18 DIAGNOSIS — I80299 Phlebitis and thrombophlebitis of other deep vessels of unspecified lower extremity: Secondary | ICD-10-CM

## 2013-11-18 DIAGNOSIS — Z7901 Long term (current) use of anticoagulants: Secondary | ICD-10-CM

## 2013-11-18 DIAGNOSIS — Z5181 Encounter for therapeutic drug level monitoring: Secondary | ICD-10-CM

## 2013-11-18 DIAGNOSIS — I471 Supraventricular tachycardia: Secondary | ICD-10-CM

## 2013-11-18 LAB — BASIC METABOLIC PANEL
BUN: 15 mg/dL (ref 6–23)
CO2: 29 mEq/L (ref 19–32)
Calcium: 9.3 mg/dL (ref 8.4–10.5)
Chloride: 102 mEq/L (ref 96–112)
Creatinine, Ser: 1 mg/dL (ref 0.4–1.5)
GFR: 80.43 mL/min (ref >60.00)
Glucose, Bld: 95 mg/dL (ref 70–99)
Potassium: 4.3 mEq/L (ref 3.5–5.1)
Sodium: 138 mEq/L (ref 135–145)

## 2013-11-18 LAB — CBC WITH DIFFERENTIAL/PLATELET
Basophils Absolute: 0 K/uL (ref 0.0–0.1)
Basophils Relative: 0.3 % (ref 0.0–3.0)
Eosinophils Absolute: 1 K/uL — ABNORMAL HIGH (ref 0.0–0.7)
Eosinophils Relative: 9.3 % — ABNORMAL HIGH (ref 0.0–5.0)
HCT: 45.3 % (ref 39.0–52.0)
Hemoglobin: 15.6 g/dL (ref 13.0–17.0)
Lymphocytes Relative: 26.5 % (ref 12.0–46.0)
Lymphs Abs: 2.8 K/uL (ref 0.7–4.0)
MCHC: 34.5 g/dL (ref 30.0–36.0)
MCV: 93.6 fl (ref 78.0–100.0)
Monocytes Absolute: 0.9 K/uL (ref 0.1–1.0)
Monocytes Relative: 8.6 % (ref 3.0–12.0)
Neutro Abs: 5.9 K/uL (ref 1.4–7.7)
Neutrophils Relative %: 55.3 % (ref 43.0–77.0)
Platelets: 320 K/uL (ref 150.0–400.0)
RBC: 4.84 Mil/uL (ref 4.22–5.81)
RDW: 13.6 % (ref 11.5–15.5)
WBC: 10.7 K/uL — ABNORMAL HIGH (ref 4.0–10.5)

## 2013-11-18 LAB — TSH: TSH: 2.46 u[IU]/mL (ref 0.35–4.50)

## 2013-11-18 LAB — POCT INR: INR: 2.1

## 2013-11-18 MED ORDER — METOPROLOL TARTRATE 50 MG PO TABS: 25.0000 mg | ORAL_TABLET | Freq: Two times a day (BID) | ORAL | Status: DC

## 2013-11-18 MED ORDER — TRIAMCINOLONE ACETONIDE 0.5 % EX CREA: 1.0000 "application " | TOPICAL_CREAM | Freq: Three times a day (TID) | CUTANEOUS | Status: DC

## 2013-11-18 NOTE — Assessment & Plan Note (Signed)
Continue with current prescription therapy as reflected on the Med list.  

## 2013-11-18 NOTE — Patient Instructions (Signed)
Please avoid sun exposure

## 2013-11-18 NOTE — Assessment & Plan Note (Signed)
On Metoprolol - lesser dose

## 2013-11-18 NOTE — Assessment & Plan Note (Signed)
Better w/less Metoprolol

## 2013-11-18 NOTE — Progress Notes (Signed)
Subjective:    HPI  C/o rash on arms, LLE, B forearms x weeks F/u asthmatic issues - better w/using less Metoprolol   F/u LBP and L leg pain - not better - he stopped Oxycontin - no help  F/u CP after he was finished carrying wood - pressure type - no relapse.   F/u painful urination x 2 d, thick urine - resolved  [name] is a 63 yo man with PMH of prior DVT/PE on warfarin dyslipidemia, obesity with BMI 38, chronic left knee osteoarthritis with a fairly recent TKA (end of September) now s/p I&D of left knee hematoma October had an SVT of 150-160s In hospital had adenosine. Started on beta blockade with no furterh issues Having issues with right knee - R knee was tapped again recently.    Right Lower Extremity Venous Duplex Evaluation 05/07/13   Summary:  - Findings consistent with chronic deep vein thrombosis involving the common femoral, femoral, popliteal, and posterior tibial veins of theright lower extremity. There is fluid noted in the right anterior knee as well as an enlarged lymph node in the right groin. - No evidence of Baker's cyst on the right. Other specific details can be found in the table(s) above. Prepared and Electronically Authenticated by    The patient is here to follow up on chronic severe to moderate LBP/OA symptoms controlled partially with medicines (oxycodone), hand pain, B12 def, h/o PE/DVT  F/u LBP and LLE pain 5-6/10; it is still 10/10 in am     BP Readings from Last 3 Encounters:  11/18/13 108/58  11/09/13 134/74  08/19/13 130/90   Wt Readings from Last 3 Encounters:  11/18/13 296 lb (134.265 kg)  08/19/13 283 lb (128.368 kg)  07/13/13 274 lb (124.286 kg)      Review of Systems  Constitutional: Positive for fatigue.  HENT: Positive for congestion, postnasal drip and rhinorrhea. Negative for sinus pressure.   Respiratory: Positive for cough. Negative for chest tightness, shortness of breath and wheezing.   Cardiovascular:  Positive for leg swelling.  Gastrointestinal: Negative for abdominal pain.  Genitourinary: Negative for frequency.  Musculoskeletal: Positive for arthralgias and back pain. Negative for myalgias.  Neurological: Negative for syncope.  Psychiatric/Behavioral: Positive for sleep disturbance. Negative for suicidal ideas and dysphoric mood. The patient is not nervous/anxious.        Objective:   Physical Exam  Constitutional: He is oriented to person, place, and time. He appears well-developed.  obese  HENT:  Mouth/Throat: Oropharynx is clear and moist.  Eyes: Conjunctivae are normal. Pupils are equal, round, and reactive to light.  Neck: Normal range of motion. No JVD present. No thyromegaly present.  Cardiovascular: Normal rate, regular rhythm, normal heart sounds and intact distal pulses.  Exam reveals no gallop and no friction rub.   No murmur heard. Pulmonary/Chest: Effort normal and breath sounds normal. No respiratory distress. He has no wheezes. He has no rales. He exhibits no tenderness.  Abdominal: Soft. Bowel sounds are normal. He exhibits no distension and no mass. There is no tenderness. There is no rebound and no guarding.  Musculoskeletal: Normal range of motion. He exhibits edema and tenderness (LS spine).  Lymphadenopathy:    He has no cervical adenopathy.  Neurological: He is alert and oriented to person, place, and time. He has normal reflexes. No cranial nerve deficit. He exhibits normal muscle tone. Coordination normal.  Skin: Skin is warm and dry. No rash noted.  Psychiatric: He has a normal mood and  affect. His behavior is normal. Judgment and thought content normal.  chest NT Small eryth papules on extr - sun exposed...   Lab Results  Component Value Date   WBC 9.3 07/13/2013   HGB 14.8 07/13/2013   HCT 43.7 07/13/2013   PLT 375.0 07/13/2013   GLUCOSE 86 07/13/2013   CHOL 218* 03/09/2013   TRIG 145.0 03/09/2013   HDL 26.20* 03/09/2013   LDLDIRECT 168.1 03/09/2013    ALT 9 05/07/2013   AST 22 05/07/2013   NA 136 07/13/2013   K 4.2 07/13/2013   CL 102 07/13/2013   CREATININE 0.8 07/13/2013   BUN 10 07/13/2013   CO2 28 07/13/2013   TSH 3.53 06/28/2013   PSA 0.13 12/14/2012   INR 2.1 11/18/2013   HGBA1C 5.3 04/19/2007       Assessment & Plan:

## 2013-11-18 NOTE — Assessment & Plan Note (Signed)
Wt Readings from Last 3 Encounters:  11/18/13 296 lb (134.265 kg)  08/19/13 283 lb (128.368 kg)  07/13/13 274 lb (124.286 kg)

## 2013-11-18 NOTE — Progress Notes (Signed)
Pre visit review using our clinic review tool, if applicable. No additional management support is needed unless otherwise documented below in the visit note. 

## 2013-12-16 ENCOUNTER — Other Ambulatory Visit: Payer: Self-pay | Admitting: *Deleted

## 2013-12-16 ENCOUNTER — Ambulatory Visit (INDEPENDENT_AMBULATORY_CARE_PROVIDER_SITE_OTHER): Payer: 59 | Admitting: General Practice

## 2013-12-16 DIAGNOSIS — Z5181 Encounter for therapeutic drug level monitoring: Secondary | ICD-10-CM

## 2013-12-16 DIAGNOSIS — I80299 Phlebitis and thrombophlebitis of other deep vessels of unspecified lower extremity: Secondary | ICD-10-CM

## 2013-12-16 DIAGNOSIS — I2699 Other pulmonary embolism without acute cor pulmonale: Secondary | ICD-10-CM

## 2013-12-16 LAB — POCT INR: INR: 2.4

## 2013-12-16 MED ORDER — OXYCODONE HCL 5 MG PO TABS: 5.0000 mg | ORAL_TABLET | Freq: Three times a day (TID) | ORAL | Status: DC | PRN

## 2013-12-16 NOTE — Progress Notes (Signed)
Pre visit review using our clinic review tool, if applicable. No additional management support is needed unless otherwise documented below in the visit note. 

## 2014-01-13 ENCOUNTER — Ambulatory Visit (INDEPENDENT_AMBULATORY_CARE_PROVIDER_SITE_OTHER): Payer: 59 | Admitting: General Practice

## 2014-01-13 DIAGNOSIS — I80299 Phlebitis and thrombophlebitis of other deep vessels of unspecified lower extremity: Secondary | ICD-10-CM

## 2014-01-13 DIAGNOSIS — I2699 Other pulmonary embolism without acute cor pulmonale: Secondary | ICD-10-CM

## 2014-01-13 DIAGNOSIS — Z5181 Encounter for therapeutic drug level monitoring: Secondary | ICD-10-CM

## 2014-01-13 LAB — POCT INR: INR: 2.6

## 2014-01-13 NOTE — Progress Notes (Signed)
Pre visit review using our clinic review tool, if applicable. No additional management support is needed unless otherwise documented below in the visit note. 

## 2014-01-17 ENCOUNTER — Other Ambulatory Visit: Payer: Self-pay | Admitting: Internal Medicine

## 2014-01-18 MED ORDER — OXYCODONE HCL 5 MG PO TABS: 5.0000 mg | ORAL_TABLET | Freq: Three times a day (TID) | ORAL | Status: DC | PRN

## 2014-01-18 NOTE — Telephone Encounter (Signed)
Patient's wife is calling and asks that a prescription for his normal dosage be written. She says patient cannot wait until next week for this refill. Please advise.

## 2014-01-18 NOTE — Telephone Encounter (Signed)
Notified pt rx ready for pick-up.../lmb 

## 2014-02-16 ENCOUNTER — Telehealth: Payer: Self-pay | Admitting: Internal Medicine

## 2014-02-16 MED ORDER — OXYCODONE HCL 5 MG PO TABS: 5.0000 mg | ORAL_TABLET | Freq: Three times a day (TID) | ORAL | Status: DC | PRN

## 2014-02-16 NOTE — Telephone Encounter (Signed)
Pt send this massage to the wrong doctor. Please advise, pt was wondering if he can get this med before the weekend is over.

## 2014-02-16 NOTE — Telephone Encounter (Signed)
Pete, pls forward this message to [facility]   Dear Gershon Mussel, We did use a higher dose in the past - it did not help. Please stay on 5 mg. I'll leave your Rx for you AP

## 2014-02-17 MED ORDER — OXYCODONE HCL 5 MG PO TABS: 5.0000 mg | ORAL_TABLET | Freq: Three times a day (TID) | ORAL | Status: DC | PRN

## 2014-02-17 NOTE — Telephone Encounter (Signed)
Pt is aware.  

## 2014-02-24 ENCOUNTER — Encounter: Payer: Self-pay | Admitting: Internal Medicine

## 2014-02-24 ENCOUNTER — Ambulatory Visit (INDEPENDENT_AMBULATORY_CARE_PROVIDER_SITE_OTHER): Payer: 59 | Admitting: *Deleted

## 2014-02-24 ENCOUNTER — Ambulatory Visit (INDEPENDENT_AMBULATORY_CARE_PROVIDER_SITE_OTHER): Payer: 59 | Admitting: Internal Medicine

## 2014-02-24 VITALS — BP 112/72 | HR 72 | Temp 98.5°F | Resp 16 | Wt 301.0 lb

## 2014-02-24 DIAGNOSIS — R059 Cough, unspecified: Secondary | ICD-10-CM | POA: Insufficient documentation

## 2014-02-24 DIAGNOSIS — J320 Chronic maxillary sinusitis: Secondary | ICD-10-CM

## 2014-02-24 DIAGNOSIS — E669 Obesity, unspecified: Secondary | ICD-10-CM

## 2014-02-24 DIAGNOSIS — Z5181 Encounter for therapeutic drug level monitoring: Secondary | ICD-10-CM

## 2014-02-24 DIAGNOSIS — J329 Chronic sinusitis, unspecified: Secondary | ICD-10-CM | POA: Insufficient documentation

## 2014-02-24 DIAGNOSIS — I2699 Other pulmonary embolism without acute cor pulmonale: Secondary | ICD-10-CM

## 2014-02-24 DIAGNOSIS — I80299 Phlebitis and thrombophlebitis of other deep vessels of unspecified lower extremity: Secondary | ICD-10-CM

## 2014-02-24 DIAGNOSIS — J45909 Unspecified asthma, uncomplicated: Secondary | ICD-10-CM

## 2014-02-24 DIAGNOSIS — R05 Cough: Secondary | ICD-10-CM

## 2014-02-24 LAB — POCT INR: INR: 2.8

## 2014-02-24 MED ORDER — AZITHROMYCIN 250 MG PO TABS: ORAL_TABLET | ORAL | Status: DC

## 2014-02-24 MED ORDER — WARFARIN SODIUM 3 MG PO TABS: ORAL_TABLET | ORAL | Status: DC

## 2014-02-24 NOTE — Progress Notes (Signed)
Pre visit review using our clinic review tool, if applicable. No additional management support is needed unless otherwise documented below in the visit note. 

## 2014-02-24 NOTE — Assessment & Plan Note (Signed)
Wt Readings from Last 3 Encounters:  02/24/14 301 lb (136.533 kg)  11/18/13 296 lb (134.265 kg)  08/19/13 283 lb (128.368 kg)

## 2014-02-24 NOTE — Assessment & Plan Note (Signed)
8/15 exacerbation  Netty pot

## 2014-02-24 NOTE — Progress Notes (Signed)
Subjective:    HPI  C/o cough, gel like mucus: clear to grey x 2-3 weeks  F/u asthmatic issues - better w/using less Metoprolol   F/u LBP and L leg pain - not better - (Robert [name] stopped Oxycontin before - no help)   Robert [name] is a 63 yo man with PMH of prior DVT/PE on warfarin dyslipidemia, obesity with BMI 38, chronic left knee osteoarthritis with a fairly recent TKA (end of September) now s/p I&D of left knee hematoma October had an SVT of 150-160s In hospital had adenosine. Started on beta blockade with no furterh issues Having issues with right knee - R knee was tapped again recently.    Right Lower Extremity Venous Duplex Evaluation 05/07/13   Summary:  - Findings consistent with chronic deep vein thrombosis involving Robert common femoral, femoral, popliteal, and posterior tibial veins of theright lower extremity. There is fluid noted in Robert right anterior knee as well as an enlarged lymph node in Robert right groin. - No evidence of Baker's cyst on Robert right. Other specific details can be found in Robert table(s) above. Prepared and Electronically Authenticated by    Robert [name] is here to follow up on chronic severe to moderate LBP/OA symptoms controlled partially with medicines (oxycodone), hand pain, B12 def, h/o PE/DVT  F/u LBP and LLE pain 5-6/10; it is still 10/10 in am     BP Readings from Last 3 Encounters:  02/24/14 112/72  11/18/13 108/58  11/09/13 134/74   Wt Readings from Last 3 Encounters:  02/24/14 301 lb (136.533 kg)  11/18/13 296 lb (134.265 kg)  08/19/13 283 lb (128.368 kg)      Review of Systems  Constitutional: Positive for fatigue.  HENT: Positive for congestion, postnasal drip and rhinorrhea. Negative for sinus pressure.   Respiratory: Positive for cough. Negative for chest tightness, shortness of breath and wheezing.   Cardiovascular: Positive for leg swelling.  Gastrointestinal: Negative for abdominal pain.  Genitourinary: Negative for  frequency.  Musculoskeletal: Positive for arthralgias and back pain. Negative for myalgias.  Neurological: Negative for syncope.  Psychiatric/Behavioral: Positive for sleep disturbance. Negative for suicidal ideas and dysphoric mood. Robert [name] is not nervous/anxious.        Objective:   Physical Exam  Constitutional: Robert [name] is oriented to person, place, and time. Robert [name] appears well-developed.  obese  HENT:  Mouth/Throat: Oropharynx is clear and moist.  Eyes: Conjunctivae are normal. Pupils are equal, round, and reactive to light.  Neck: Normal range of motion. No JVD present. No thyromegaly present.  Cardiovascular: Normal rate, regular rhythm, normal heart sounds and intact distal pulses.  Exam reveals no gallop and no friction rub.   No murmur heard. Pulmonary/Chest: Effort normal and breath sounds normal. No respiratory distress. Robert [name] has no wheezes. Robert [name] has no rales. Robert [name] exhibits no tenderness.  Abdominal: Soft. Bowel sounds are normal. Robert [name] exhibits no distension and no mass. There is no tenderness. There is no rebound and no guarding.  Musculoskeletal: Normal range of motion. Robert [name] exhibits edema and tenderness (LS spine).  Lymphadenopathy:    Robert [name] has no cervical adenopathy.  Neurological: Robert [name] is alert and oriented to person, place, and time. Robert [name] has normal reflexes. No cranial nerve deficit. Robert [name] exhibits normal muscle tone. Coordination normal.  Skin: Skin is warm and dry. No rash noted.  Psychiatric: Robert [name] has a normal mood and affect. Robert [name] behavior is normal. Judgment and thought content normal.  chest NT    Lab Results  Component Value  Date   WBC 10.7* 11/18/2013   HGB 15.6 11/18/2013   HCT 45.3 11/18/2013   PLT 320.0 11/18/2013   GLUCOSE 95 11/18/2013   CHOL 218* 03/09/2013   TRIG 145.0 03/09/2013   HDL 26.20* 03/09/2013   LDLDIRECT 168.1 03/09/2013   ALT 9 05/07/2013   AST 22 05/07/2013   NA 138 11/18/2013   K 4.3 11/18/2013   CL 102 11/18/2013   CREATININE 1.0 11/18/2013   BUN 15 11/18/2013    CO2 29 11/18/2013   TSH 2.46 11/18/2013   PSA 0.13 12/14/2012   INR 2.8 02/24/2014   HGBA1C 5.3 04/19/2007       Assessment & Plan:

## 2014-02-24 NOTE — Assessment & Plan Note (Signed)
Discussed PO abx Netty pot

## 2014-02-24 NOTE — Assessment & Plan Note (Signed)
Chronic, seasonal - worse now   Breo and Anoro - intolerant 5/15 Better w/less Metoprolol

## 2014-03-24 ENCOUNTER — Ambulatory Visit: Payer: 59 | Admitting: Internal Medicine

## 2014-04-05 ENCOUNTER — Ambulatory Visit (INDEPENDENT_AMBULATORY_CARE_PROVIDER_SITE_OTHER): Payer: 59 | Admitting: *Deleted

## 2014-04-05 DIAGNOSIS — I80209 Phlebitis and thrombophlebitis of unspecified deep vessels of unspecified lower extremity: Secondary | ICD-10-CM

## 2014-04-05 DIAGNOSIS — I2699 Other pulmonary embolism without acute cor pulmonale: Secondary | ICD-10-CM

## 2014-04-05 DIAGNOSIS — Z5181 Encounter for therapeutic drug level monitoring: Secondary | ICD-10-CM

## 2014-04-05 DIAGNOSIS — I80299 Phlebitis and thrombophlebitis of other deep vessels of unspecified lower extremity: Secondary | ICD-10-CM

## 2014-04-05 LAB — POCT INR: INR: 2.9

## 2014-04-11 ENCOUNTER — Other Ambulatory Visit: Payer: Self-pay | Admitting: Internal Medicine

## 2014-04-12 MED ORDER — OXYCODONE HCL 5 MG PO TABS: 5.0000 mg | ORAL_TABLET | Freq: Three times a day (TID) | ORAL | Status: DC | PRN

## 2014-04-30 ENCOUNTER — Encounter: Payer: Self-pay | Admitting: Internal Medicine

## 2014-05-09 ENCOUNTER — Ambulatory Visit (INDEPENDENT_AMBULATORY_CARE_PROVIDER_SITE_OTHER): Payer: 59 | Admitting: *Deleted

## 2014-05-09 ENCOUNTER — Encounter: Payer: Self-pay | Admitting: Cardiovascular Disease

## 2014-05-09 ENCOUNTER — Ambulatory Visit (INDEPENDENT_AMBULATORY_CARE_PROVIDER_SITE_OTHER): Payer: 59 | Admitting: Cardiovascular Disease

## 2014-05-09 VITALS — BP 130/78 | HR 63 | Ht 71.5 in | Wt 302.6 lb

## 2014-05-09 DIAGNOSIS — E785 Hyperlipidemia, unspecified: Secondary | ICD-10-CM

## 2014-05-09 DIAGNOSIS — I471 Supraventricular tachycardia: Secondary | ICD-10-CM

## 2014-05-09 DIAGNOSIS — I350 Nonrheumatic aortic (valve) stenosis: Secondary | ICD-10-CM

## 2014-05-09 DIAGNOSIS — I82401 Acute embolism and thrombosis of unspecified deep veins of right lower extremity: Secondary | ICD-10-CM

## 2014-05-09 DIAGNOSIS — Z23 Encounter for immunization: Secondary | ICD-10-CM

## 2014-05-09 NOTE — Assessment & Plan Note (Signed)
Mild by echo no change in murmur No indication for f/u echo at this time

## 2014-05-09 NOTE — Progress Notes (Signed)
Patient ID: Robert [name], male   DOB: 11/21/1950, 63 y.o.   MRN: [id number] Robert [name] is a 63 yo man with PMH of prior DVT/PE on warfarin dyslipidemia, obesity with BMI 38, chronic left knee osteoarthritis with a fairly recent TKA (end of September) now s/p I&D of left knee hematoma October had an SVT of 150-160s In hospital had adenosine. Started on beta blockade with no furterh issues Having issues with right knee now. F/U Robert [name] this week may have infection again  Dup[lex 05/07/13 with chronic RLE DVT  Echo with normal EF  Study Conclusions  - Left ventricle: The cavity size was normal. Wall thickness was increased in a pattern of mild LVH. Systolic function was normal. The estimated ejection fraction was in the range of 55% to 60%. - Aortic valve: There was very mild stenosis. - Left atrium: The atrium was mildly dilated. - Atrial septum: No defect or patent foramen ovale was Identified.  He seems to have some issues with his disability and work  Wants flu shot  Discussed changing to NOAC and he will look into cost of it  ROS: Denies fever, malais, weight loss, blurry vision, decreased visual acuity, cough, sputum, SOB, hemoptysis, pleuritic pain, palpitaitons, heartburn, abdominal pain, melena, lower extremity edema, claudication, or rash.  All other systems reviewed and negative  General: Affect appropriate Overweight white male  HEENT: normal Neck supple with no adenopathy JVP normal no bruits no thyromegaly Lungs clear with no wheezing and good diaphragmatic motion Heart:  S1/S2 SEM  murmur, no rub, gallop or click PMI normal Abdomen: benighn, BS positve, no tenderness, no AAA no bruit.  No HSM or HJR Distal pulses intact with no bruits Plus 2 RLE with sleeve on  Plus one LLE edema post TKR  Neuro non-focal Skin warm and dry No muscular weakness   Current Outpatient Prescriptions  Medication Sig Dispense Refill  . albuterol (PROAIR HFA) 108 (90 BASE) MCG/ACT  inhaler Inhale 2 puffs into the lungs every 6 (six) hours as needed for wheezing or shortness of breath. 1 Inhaler 6  . albuterol (PROVENTIL) (2.5 MG/3ML) 0.083% nebulizer solution Take 3 mLs (2.5 mg total) by nebulization every 6 (six) hours as needed for wheezing or shortness of breath. 150 mL 1  . azithromycin (ZITHROMAX) 250 MG tablet As directed 6 tablet 0  . cetirizine (ZYRTEC) 10 MG tablet Take 10 mg by mouth daily.     . Cyanocobalamin (VITAMIN B-12) 5000 MCG SUBL Place 5,000 tablets under the tongue every morning.    . fish oil-omega-3 fatty acids 1000 MG capsule Take 2 g by mouth every morning.    [name] glucosamine-chondroitin 500-400 MG tablet Take 1 tablet by mouth every morning.    . metoprolol (LOPRESSOR) 50 MG tablet Take 0.5 tablets (25 mg total) by mouth 2 (two) times daily. 30 tablet 12  . nortriptyline (PAMELOR) 10 MG capsule Take 10 mg by mouth every evening.    [name] oxyCODONE (OXY IR/ROXICODONE) 5 MG immediate release tablet Take 1 tablet (5 mg total) by mouth every 8 (eight) hours as needed. 90 tablet 0  . triamcinolone cream (KENALOG) 0.5 % Apply 1 application topically 3 (three) times daily. 120 g 1  . warfarin (COUMADIN) 3 MG tablet As directed 60 tablet 11   No current facility-administered medications for this visit.    Allergies  Anoro ellipta; Breo ellipta; Exalgo; Fentanyl; Gabapentin; Hydrocodone-acetaminophen; Lopressor; Penicillins; Sulfonamide derivatives; and Tylenol  Electrocardiogram:  SR rate 71 normal  Assessment and Plan

## 2014-05-09 NOTE — Assessment & Plan Note (Signed)
Cholesterol is at goal.  Continue current dose of statin and diet Rx.  No myalgias or side effects.  F/U  LFT's in 6 months. No results found for: LDLCALC Labs with primary           

## 2014-05-09 NOTE — Patient Instructions (Signed)
Your physician wants you to follow-up in:  6 MONTHS WITH DR NISHAN  You will receive a reminder letter in the mail two months in advance. If you don't receive a letter, please call our office to schedule the follow-up appointment. Your physician recommends that you continue on your current medications as directed. Please refer to the Current Medication list given to you today. 

## 2014-05-09 NOTE — Assessment & Plan Note (Signed)
Chronic with LE venous insuf.  Continue compression stocking Chronic coumadin with history of DVT  He will let us know if  He wants to change to xarelto

## 2014-05-09 NOTE — Assessment & Plan Note (Signed)
Non recurrent continue beta blocker 

## 2014-05-17 ENCOUNTER — Ambulatory Visit (INDEPENDENT_AMBULATORY_CARE_PROVIDER_SITE_OTHER): Payer: 59

## 2014-05-17 DIAGNOSIS — I80209 Phlebitis and thrombophlebitis of unspecified deep vessels of unspecified lower extremity: Secondary | ICD-10-CM

## 2014-05-17 DIAGNOSIS — I82401 Acute embolism and thrombosis of unspecified deep veins of right lower extremity: Secondary | ICD-10-CM

## 2014-05-17 DIAGNOSIS — I2699 Other pulmonary embolism without acute cor pulmonale: Secondary | ICD-10-CM

## 2014-05-17 DIAGNOSIS — Z5181 Encounter for therapeutic drug level monitoring: Secondary | ICD-10-CM

## 2014-05-17 LAB — POCT INR: INR: 2.3

## 2014-05-19 ENCOUNTER — Other Ambulatory Visit: Payer: Self-pay | Admitting: *Deleted

## 2014-05-19 MED ORDER — ALBUTEROL SULFATE (2.5 MG/3ML) 0.083% IN NEBU: 2.5000 mg | INHALATION_SOLUTION | Freq: Four times a day (QID) | RESPIRATORY_TRACT | Status: DC | PRN

## 2014-05-22 ENCOUNTER — Other Ambulatory Visit: Payer: Self-pay | Admitting: Geriatric Medicine

## 2014-05-22 MED ORDER — ALBUTEROL SULFATE (2.5 MG/3ML) 0.083% IN NEBU: 2.5000 mg | INHALATION_SOLUTION | Freq: Four times a day (QID) | RESPIRATORY_TRACT | Status: DC | PRN

## 2014-05-29 ENCOUNTER — Other Ambulatory Visit (INDEPENDENT_AMBULATORY_CARE_PROVIDER_SITE_OTHER): Payer: 59

## 2014-05-29 ENCOUNTER — Encounter: Payer: Self-pay | Admitting: Internal Medicine

## 2014-05-29 ENCOUNTER — Ambulatory Visit (INDEPENDENT_AMBULATORY_CARE_PROVIDER_SITE_OTHER): Payer: 59 | Admitting: Internal Medicine

## 2014-05-29 VITALS — BP 140/90 | HR 67 | Temp 98.1°F | Resp 20 | Ht 71.5 in | Wt 305.0 lb

## 2014-05-29 DIAGNOSIS — M545 Low back pain: Secondary | ICD-10-CM

## 2014-05-29 DIAGNOSIS — E538 Deficiency of other specified B group vitamins: Secondary | ICD-10-CM

## 2014-05-29 DIAGNOSIS — I2699 Other pulmonary embolism without acute cor pulmonale: Secondary | ICD-10-CM

## 2014-05-29 DIAGNOSIS — N320 Bladder-neck obstruction: Secondary | ICD-10-CM

## 2014-05-29 DIAGNOSIS — I82401 Acute embolism and thrombosis of unspecified deep veins of right lower extremity: Secondary | ICD-10-CM

## 2014-05-29 DIAGNOSIS — I471 Supraventricular tachycardia, unspecified: Secondary | ICD-10-CM

## 2014-05-29 DIAGNOSIS — E785 Hyperlipidemia, unspecified: Secondary | ICD-10-CM

## 2014-05-29 LAB — URINALYSIS
Bilirubin Urine: NEGATIVE
Ketones, ur: NEGATIVE
Leukocytes, UA: NEGATIVE
Nitrite: NEGATIVE
Specific Gravity, Urine: 1.025 (ref 1.000–1.030)
Total Protein, Urine: NEGATIVE
Urine Glucose: NEGATIVE
Urobilinogen, UA: 0.2 (ref 0.0–1.0)
pH: 5.5 (ref 5.0–8.0)

## 2014-05-29 LAB — CBC WITH DIFFERENTIAL/PLATELET
Basophils Absolute: 0.1 10*3/uL (ref 0.0–0.1)
Basophils Relative: 0.7 % (ref 0.0–3.0)
Eosinophils Absolute: 1.2 10*3/uL — ABNORMAL HIGH (ref 0.0–0.7)
Eosinophils Relative: 12.2 % — ABNORMAL HIGH (ref 0.0–5.0)
HCT: 47.2 % (ref 39.0–52.0)
Hemoglobin: 16.1 g/dL (ref 13.0–17.0)
Lymphocytes Relative: 32.9 % (ref 12.0–46.0)
Lymphs Abs: 3.1 10*3/uL (ref 0.7–4.0)
MCHC: 34.1 g/dL (ref 30.0–36.0)
MCV: 93.2 fl (ref 78.0–100.0)
Monocytes Absolute: 0.9 10*3/uL (ref 0.1–1.0)
Monocytes Relative: 9.5 % (ref 3.0–12.0)
Neutro Abs: 4.2 10*3/uL (ref 1.4–7.7)
Neutrophils Relative %: 44.7 % (ref 43.0–77.0)
Platelets: 282 10*3/uL (ref 150.0–400.0)
RBC: 5.06 Mil/uL (ref 4.22–5.81)
RDW: 12.8 % (ref 11.5–15.5)
WBC: 9.4 10*3/uL (ref 4.0–10.5)

## 2014-05-29 LAB — BASIC METABOLIC PANEL
BUN: 12 mg/dL (ref 6–23)
CO2: 28 mEq/L (ref 19–32)
Calcium: 9.4 mg/dL (ref 8.4–10.5)
Chloride: 101 mEq/L (ref 96–112)
Creatinine, Ser: 1 mg/dL (ref 0.4–1.5)
GFR: 77.6 mL/min (ref >60.00)
Glucose, Bld: 95 mg/dL (ref 70–99)
Potassium: 5.4 mEq/L — ABNORMAL HIGH (ref 3.5–5.1)
Sodium: 137 mEq/L (ref 135–145)

## 2014-05-29 LAB — TSH: TSH: 3.22 u[IU]/mL (ref 0.35–4.50)

## 2014-05-29 LAB — LIPID PANEL
Cholesterol: 238 mg/dL — ABNORMAL HIGH (ref 0–200)
HDL: 29.8 mg/dL — ABNORMAL LOW (ref >39.00)
NonHDL: 208.2
Total CHOL/HDL Ratio: 8
Triglycerides: 346 mg/dL — ABNORMAL HIGH (ref 0.0–149.0)
VLDL: 69.2 mg/dL — ABNORMAL HIGH (ref 0.0–40.0)

## 2014-05-29 LAB — PSA: PSA: 0.1 ng/mL (ref 0.10–4.00)

## 2014-05-29 LAB — HEPATIC FUNCTION PANEL
ALT: 42 U/L (ref 0–53)
AST: 33 U/L (ref 0–37)
Albumin: 3.9 g/dL (ref 3.5–5.2)
Alkaline Phosphatase: 73 U/L (ref 39–117)
Bilirubin, Direct: 0.1 mg/dL (ref 0.0–0.3)
Total Bilirubin: 0.7 mg/dL (ref 0.2–1.2)
Total Protein: 7.5 g/dL (ref 6.0–8.3)

## 2014-05-29 LAB — LDL CHOLESTEROL, DIRECT: Direct LDL: 149.4 mg/dL

## 2014-05-29 MED ORDER — ALBUTEROL SULFATE HFA 108 (90 BASE) MCG/ACT IN AERS: 2.0000 | INHALATION_SPRAY | Freq: Four times a day (QID) | RESPIRATORY_TRACT | Status: DC | PRN

## 2014-05-29 MED ORDER — VITAMIN D 1000 UNITS PO TABS: 1000.0000 [IU] | ORAL_TABLET | Freq: Every day | ORAL | Status: AC

## 2014-05-29 MED ORDER — NORTRIPTYLINE HCL 10 MG PO CAPS: 10.0000 mg | ORAL_CAPSULE | Freq: Every evening | ORAL | Status: DC

## 2014-05-29 MED ORDER — OXYCODONE HCL 5 MG PO TABS: 5.0000 mg | ORAL_TABLET | Freq: Three times a day (TID) | ORAL | Status: DC | PRN

## 2014-05-29 NOTE — Assessment & Plan Note (Signed)
Per Dr Johnsie Cancel Continue with current prescription therapy as reflected on the Med list.

## 2014-05-29 NOTE — Assessment & Plan Note (Signed)
Dr Johnsie Cancel is planning to switch him to Coumadin

## 2014-05-29 NOTE — Progress Notes (Signed)
Subjective:    HPI  C/o cough, gel like mucus: clear to grey: sinuses are better w/a Netty pot, still having a drainage in the back  F/u asthmatic issues - better w/using less Metoprolol   F/u LBP and L leg pain - not better - (he stopped Oxycontin before - no help)   Robert [name] is a 63 yo man with PMH of prior DVT/PE on warfarin dyslipidemia, obesity with BMI 38, chronic left knee osteoarthritis with a fairly recent TKA (end of September) now s/p I&D of left knee hematoma October had an SVT of 150-160s In hospital had adenosine. Started on beta blockade with no furterh issues Having issues with right knee - R knee was tapped again recently.    Right Lower Extremity Venous Duplex Evaluation 05/07/13   Summary:  - Findings consistent with chronic deep vein thrombosis involving the common femoral, femoral, popliteal, and posterior tibial veins of theright lower extremity. There is fluid noted in the right anterior knee as well as an enlarged lymph node in the right groin. - No evidence of Baker's cyst on the right. Other specific details can be found in the table(s) above. Prepared and Electronically Authenticated by    The patient is here to follow up on chronic severe to moderate LBP/OA symptoms controlled partially with medicines (oxycodone), hand pain, B12 def, h/o PE/DVT  F/u LBP and LLE pain 5-6/10; it is still 10/10 in am     BP Readings from Last 3 Encounters:  05/29/14 140/90  05/09/14 130/78  02/24/14 112/72   Wt Readings from Last 3 Encounters:  05/29/14 305 lb (138.347 kg)  05/09/14 302 lb 9.6 oz (137.258 kg)  02/24/14 301 lb (136.533 kg)      Review of Systems  Constitutional: Positive for fatigue.  HENT: Positive for congestion, postnasal drip and rhinorrhea. Negative for sinus pressure.   Respiratory: Positive for cough. Negative for chest tightness, shortness of breath and wheezing.   Cardiovascular: Positive for leg swelling.   Gastrointestinal: Negative for abdominal pain.  Genitourinary: Negative for frequency.  Musculoskeletal: Positive for back pain and arthralgias. Negative for myalgias.  Neurological: Negative for syncope.  Psychiatric/Behavioral: Positive for sleep disturbance. Negative for suicidal ideas and dysphoric mood. The patient is not nervous/anxious.        Objective:   Physical Exam  Constitutional: He is oriented to person, place, and time. He appears well-developed. No distress.  NAD  HENT:  Mouth/Throat: Oropharynx is clear and moist.  Eyes: Conjunctivae are normal. Pupils are equal, round, and reactive to light.  Neck: Normal range of motion. No JVD present. No thyromegaly present.  Cardiovascular: Normal rate, regular rhythm, normal heart sounds and intact distal pulses.  Exam reveals no gallop and no friction rub.   No murmur heard. Pulmonary/Chest: Effort normal and breath sounds normal. No respiratory distress. He has no wheezes. He has no rales. He exhibits no tenderness.  Abdominal: Soft. Bowel sounds are normal. He exhibits no distension and no mass. There is no tenderness. There is no rebound and no guarding.  Musculoskeletal: Normal range of motion. He exhibits no edema or tenderness.  Lymphadenopathy:    He has no cervical adenopathy.  Neurological: He is alert and oriented to person, place, and time. He has normal reflexes. No cranial nerve deficit. He exhibits normal muscle tone. He displays a negative Romberg sign. Coordination and gait normal.  No meningeal signs  Skin: Skin is warm and dry. No rash noted.  Psychiatric: He has  a normal mood and affect. His behavior is normal. Judgment and thought content normal.  chest NT L leg in a hose Obese    Lab Results  Component Value Date   WBC 10.7* 11/18/2013   HGB 15.6 11/18/2013   HCT 45.3 11/18/2013   PLT 320.0 11/18/2013   GLUCOSE 95 11/18/2013   CHOL 218* 03/09/2013   TRIG 145.0 03/09/2013   HDL 26.20* 03/09/2013    LDLDIRECT 168.1 03/09/2013   ALT 9 05/07/2013   AST 22 05/07/2013   NA 138 11/18/2013   K 4.3 11/18/2013   CL 102 11/18/2013   CREATININE 1.0 11/18/2013   BUN 15 11/18/2013   CO2 29 11/18/2013   TSH 2.46 11/18/2013   PSA 0.13 12/14/2012   INR 2.3 05/17/2014   HGBA1C 5.3 04/19/2007       Assessment & Plan:

## 2014-05-29 NOTE — Assessment & Plan Note (Signed)
Continue with current prescription therapy as reflected on the Med list.  

## 2014-05-29 NOTE — Progress Notes (Signed)
Pre visit review using our clinic review tool, if applicable. No additional management support is needed unless otherwise documented below in the visit note. 

## 2014-06-06 ENCOUNTER — Ambulatory Visit (INDEPENDENT_AMBULATORY_CARE_PROVIDER_SITE_OTHER): Payer: 59 | Admitting: *Deleted

## 2014-06-06 DIAGNOSIS — Z23 Encounter for immunization: Secondary | ICD-10-CM

## 2014-06-28 ENCOUNTER — Ambulatory Visit (INDEPENDENT_AMBULATORY_CARE_PROVIDER_SITE_OTHER): Payer: 59 | Admitting: Family Medicine

## 2014-06-28 DIAGNOSIS — I82401 Acute embolism and thrombosis of unspecified deep veins of right lower extremity: Secondary | ICD-10-CM

## 2014-06-28 DIAGNOSIS — I2699 Other pulmonary embolism without acute cor pulmonale: Secondary | ICD-10-CM

## 2014-06-28 DIAGNOSIS — Z5181 Encounter for therapeutic drug level monitoring: Secondary | ICD-10-CM

## 2014-06-28 DIAGNOSIS — I80299 Phlebitis and thrombophlebitis of other deep vessels of unspecified lower extremity: Secondary | ICD-10-CM

## 2014-06-28 DIAGNOSIS — I80209 Phlebitis and thrombophlebitis of unspecified deep vessels of unspecified lower extremity: Secondary | ICD-10-CM

## 2014-06-28 LAB — POCT INR: INR: 2.1

## 2014-07-15 ENCOUNTER — Encounter: Payer: Self-pay | Admitting: Internal Medicine

## 2014-07-31 ENCOUNTER — Other Ambulatory Visit: Payer: Self-pay | Admitting: Internal Medicine

## 2014-08-01 MED ORDER — OXYCODONE HCL 5 MG PO TABS: 5.0000 mg | ORAL_TABLET | Freq: Three times a day (TID) | ORAL | Status: DC | PRN

## 2014-08-01 NOTE — Telephone Encounter (Signed)
Wife is requesting call on her cell phone at [phone]

## 2014-08-01 NOTE — Telephone Encounter (Signed)
Patient's wife called back in regards.  She is in town and would like to pick up today.  He is also having dental surgery and is requesting a script for the antibiotic for that procedure.

## 2014-08-01 NOTE — Telephone Encounter (Signed)
Notified pt with md response.../lmb 

## 2014-08-01 NOTE — Telephone Encounter (Signed)
OK Oxycodone No need for abx prophylaxis Thx

## 2014-08-09 ENCOUNTER — Ambulatory Visit (INDEPENDENT_AMBULATORY_CARE_PROVIDER_SITE_OTHER): Payer: 59 | Admitting: General Practice

## 2014-08-09 DIAGNOSIS — Z5181 Encounter for therapeutic drug level monitoring: Secondary | ICD-10-CM

## 2014-08-09 DIAGNOSIS — I2699 Other pulmonary embolism without acute cor pulmonale: Secondary | ICD-10-CM

## 2014-08-09 DIAGNOSIS — I80209 Phlebitis and thrombophlebitis of unspecified deep vessels of unspecified lower extremity: Secondary | ICD-10-CM

## 2014-08-09 LAB — POCT INR: INR: 2.5

## 2014-08-09 NOTE — Progress Notes (Signed)
Agree with plan 

## 2014-08-09 NOTE — Progress Notes (Signed)
Pre visit review using our clinic review tool, if applicable. No additional management support is needed unless otherwise documented below in the visit note. 

## 2014-08-10 ENCOUNTER — Telehealth: Payer: Self-pay | Admitting: *Deleted

## 2014-08-10 NOTE — Telephone Encounter (Signed)
APPT  MADE   FOR 11-06-14 AT   2:00 PM   WITH  DR NISHAN./CY

## 2014-08-10 NOTE — Telephone Encounter (Signed)
SPOKE  WITH PT  IS   DUE  FOR   APPT  IN  MAY PT  WILL  CALL BACK TO  SCHEDULE .Adonis Housekeeper

## 2014-08-29 ENCOUNTER — Telehealth: Payer: Self-pay | Admitting: Internal Medicine

## 2014-08-30 NOTE — Telephone Encounter (Signed)
Pt wife called in said that he needs this refill before he goes out town this weekend     [phone] wife

## 2014-08-31 ENCOUNTER — Other Ambulatory Visit: Payer: Self-pay | Admitting: Family

## 2014-08-31 MED ORDER — OXYCODONE HCL 5 MG PO TABS: 5.0000 mg | ORAL_TABLET | Freq: Three times a day (TID) | ORAL | Status: DC | PRN

## 2014-08-31 NOTE — Telephone Encounter (Signed)
OK to fill this prescription with additional refills x0 Thank you!  

## 2014-08-31 NOTE — Telephone Encounter (Signed)
Ok to Rf oxyCODONE (OXY IR/ROXICODONE) 5 MG immediate release tablet [Alex [name], MD]

## 2014-09-01 ENCOUNTER — Ambulatory Visit: Payer: 59 | Admitting: Internal Medicine

## 2014-09-01 NOTE — Telephone Encounter (Signed)
Rx was printed/signed by Terri Piedra and informed by Peyton Najjar., CMA.

## 2014-09-20 ENCOUNTER — Ambulatory Visit (INDEPENDENT_AMBULATORY_CARE_PROVIDER_SITE_OTHER): Payer: 59 | Admitting: General Practice

## 2014-09-20 DIAGNOSIS — I80209 Phlebitis and thrombophlebitis of unspecified deep vessels of unspecified lower extremity: Secondary | ICD-10-CM | POA: Diagnosis not present

## 2014-09-20 DIAGNOSIS — I2699 Other pulmonary embolism without acute cor pulmonale: Secondary | ICD-10-CM

## 2014-09-20 DIAGNOSIS — Z5181 Encounter for therapeutic drug level monitoring: Secondary | ICD-10-CM

## 2014-09-20 DIAGNOSIS — I82409 Acute embolism and thrombosis of unspecified deep veins of unspecified lower extremity: Secondary | ICD-10-CM

## 2014-09-20 LAB — POCT INR: INR: 2.3

## 2014-09-20 NOTE — Progress Notes (Signed)
Agree with plan 

## 2014-09-20 NOTE — Progress Notes (Signed)
Pre visit review using our clinic review tool, if applicable. No additional management support is needed unless otherwise documented below in the visit note. 

## 2014-10-24 ENCOUNTER — Telehealth: Payer: Self-pay | Admitting: Internal Medicine

## 2014-10-24 NOTE — Telephone Encounter (Signed)
Please advise, thanks.

## 2014-10-24 NOTE — Telephone Encounter (Signed)
Would like to know if Dr. Alain Marion could prescribe a pain med for back.

## 2014-10-25 MED ORDER — OXYCODONE HCL 5 MG PO TABS: 5.0000 mg | ORAL_TABLET | Freq: Three times a day (TID) | ORAL | Status: DC | PRN

## 2014-10-25 NOTE — Telephone Encounter (Signed)
Robert [name] is taking Oxycodone for pain, correct? Thx

## 2014-10-25 NOTE — Telephone Encounter (Signed)
Advised patient that roxicodone rx would be ready for pick up on 5/5 in am

## 2014-10-25 NOTE — Telephone Encounter (Signed)
Done. Thx.

## 2014-10-25 NOTE — Telephone Encounter (Signed)
Left message asking patient to call back and tell us if he is taking oxycodone for pain

## 2014-10-25 NOTE — Telephone Encounter (Signed)
Patients wife states patient prefers roxicodone---has been out of med for about 2 weeks now and would like roxicodone called in---please advise, thanks

## 2014-11-01 ENCOUNTER — Ambulatory Visit (INDEPENDENT_AMBULATORY_CARE_PROVIDER_SITE_OTHER): Payer: 59 | Admitting: General Practice

## 2014-11-01 DIAGNOSIS — I80209 Phlebitis and thrombophlebitis of unspecified deep vessels of unspecified lower extremity: Secondary | ICD-10-CM | POA: Diagnosis not present

## 2014-11-01 DIAGNOSIS — I2699 Other pulmonary embolism without acute cor pulmonale: Secondary | ICD-10-CM | POA: Diagnosis not present

## 2014-11-01 DIAGNOSIS — Z5181 Encounter for therapeutic drug level monitoring: Secondary | ICD-10-CM | POA: Diagnosis not present

## 2014-11-01 LAB — POCT INR: INR: 2.8

## 2014-11-01 NOTE — Progress Notes (Signed)
Agree with plan 

## 2014-11-01 NOTE — Progress Notes (Signed)
Pre visit review using our clinic review tool, if applicable. No additional management support is needed unless otherwise documented below in the visit note. 

## 2014-11-06 ENCOUNTER — Ambulatory Visit: Payer: 59 | Admitting: Cardiovascular Disease

## 2014-11-09 ENCOUNTER — Other Ambulatory Visit: Payer: Self-pay | Admitting: Internal Medicine

## 2014-12-01 ENCOUNTER — Ambulatory Visit (INDEPENDENT_AMBULATORY_CARE_PROVIDER_SITE_OTHER): Payer: 59 | Admitting: Internal Medicine

## 2014-12-01 ENCOUNTER — Encounter: Payer: Self-pay | Admitting: Internal Medicine

## 2014-12-01 ENCOUNTER — Other Ambulatory Visit (INDEPENDENT_AMBULATORY_CARE_PROVIDER_SITE_OTHER): Payer: 59

## 2014-12-01 VITALS — BP 122/76 | HR 78 | Wt 303.0 lb

## 2014-12-01 DIAGNOSIS — M1712 Unilateral primary osteoarthritis, left knee: Secondary | ICD-10-CM

## 2014-12-01 DIAGNOSIS — I471 Supraventricular tachycardia: Secondary | ICD-10-CM

## 2014-12-01 LAB — BASIC METABOLIC PANEL
BUN: 12 mg/dL (ref 6–23)
CO2: 32 mEq/L (ref 19–32)
Calcium: 9.4 mg/dL (ref 8.4–10.5)
Chloride: 100 mEq/L (ref 96–112)
Creatinine, Ser: 1.07 mg/dL (ref 0.40–1.50)
GFR: 74.14 mL/min (ref >60.00)
Glucose, Bld: 113 mg/dL — ABNORMAL HIGH (ref 70–99)
Potassium: 4.1 mEq/L (ref 3.5–5.1)
Sodium: 137 mEq/L (ref 135–145)

## 2014-12-01 MED ORDER — OXYCODONE HCL 5 MG PO TABS: 5.0000 mg | ORAL_TABLET | Freq: Three times a day (TID) | ORAL | Status: DC | PRN

## 2014-12-01 MED ORDER — FUROSEMIDE 20 MG PO TABS: 20.0000 mg | ORAL_TABLET | Freq: Every day | ORAL | Status: DC

## 2014-12-01 NOTE — Progress Notes (Signed)
Pre visit review using our clinic review tool, if applicable. No additional management support is needed unless otherwise documented below in the visit note. 

## 2014-12-01 NOTE — Assessment & Plan Note (Signed)
Oxycodone prn

## 2014-12-01 NOTE — Progress Notes (Signed)
Subjective:    HPI  F/u asthma cough, gel like mucus: clear to grey: sinuses are better w/a Netty pot, still having a drainage in the back - [name] stopped Lopressor - lung sx's resolved (2 mo ago)  F/u LBP and L leg pain - not better - (he stopped Oxycontin before - no help)  Robert [name] is a 64 yo man with PMH of prior DVT/PE on warfarin dyslipidemia, obesity with BMI 38, chronic left knee osteoarthritis with a fairly recent TKA (end of September) now s/p I&D of left knee hematoma October had an SVT of 150-160s In hospital had adenosine. Started on beta blockade with no furterh issues Having issues with right knee - R knee was tapped again recently.  Right Lower Extremity Venous Duplex Evaluation 05/07/13  Summary:  - Findings consistent with chronic deep vein thrombosis involving the common femoral, femoral, popliteal, and posterior tibial veins of theright lower extremity. There is fluid noted in the right anterior knee as well as an enlarged lymph node in the right groin. - No evidence of Baker's cyst on the right. Other specific details can be found in the table(s) above. Prepared and Electronically Authenticated by    The patient is here to follow up on chronic severe to moderate LBP/OA symptoms controlled partially with medicines (oxycodone), hand pain, B12 def, h/o PE/DVT  F/u LBP and LLE pain 5-6/10; it is still 10/10 in am     BP Readings from Last 3 Encounters:  12/01/14 122/76  05/29/14 140/90  05/09/14 130/78   Wt Readings from Last 3 Encounters:  12/01/14 303 lb (137.44 kg)  05/29/14 305 lb (138.347 kg)  05/09/14 302 lb 9.6 oz (137.258 kg)      Review of Systems  Constitutional: Positive for fatigue.  HENT: Positive for congestion, postnasal drip and rhinorrhea. Negative for sinus pressure.   Respiratory: Positive for cough. Negative for chest tightness, shortness of breath and wheezing.   Cardiovascular: Positive for leg swelling.  Gastrointestinal:  Negative for abdominal pain.  Genitourinary: Negative for frequency.  Musculoskeletal: Positive for back pain and arthralgias. Negative for myalgias.  Neurological: Negative for syncope.  Psychiatric/Behavioral: Positive for sleep disturbance. Negative for suicidal ideas and dysphoric mood. The patient is not nervous/anxious.        Objective:   Physical Exam  Constitutional: He is oriented to person, place, and time. He appears well-developed. No distress.  NAD  HENT:  Mouth/Throat: Oropharynx is clear and moist.  Eyes: Conjunctivae are normal. Pupils are equal, round, and reactive to light.  Neck: Normal range of motion. No JVD present. No thyromegaly present.  Cardiovascular: Normal rate, regular rhythm, normal heart sounds and intact distal pulses.  Exam reveals no gallop and no friction rub.   No murmur heard. Pulmonary/Chest: Effort normal and breath sounds normal. No respiratory distress. He has no wheezes. He has no rales. He exhibits no tenderness.  Abdominal: Soft. Bowel sounds are normal. He exhibits no distension and no mass. There is no tenderness. There is no rebound and no guarding.  Musculoskeletal: Normal range of motion. He exhibits no edema or tenderness.  Lymphadenopathy:    He has no cervical adenopathy.  Neurological: He is alert and oriented to person, place, and time. He has normal reflexes. No cranial nerve deficit. He exhibits normal muscle tone. He displays a negative Romberg sign. Coordination and gait normal.  No meningeal signs  Skin: Skin is warm and dry. No rash noted.  Psychiatric: He has a normal  mood and affect. His behavior is normal. Judgment and thought content normal.  chest NT L leg in a hose Obese    Lab Results  Component Value Date   WBC 9.4 05/29/2014   HGB 16.1 05/29/2014   HCT 47.2 05/29/2014   PLT 282.0 05/29/2014   GLUCOSE 95 05/29/2014   CHOL 238* 05/29/2014   TRIG 346.0* 05/29/2014   HDL 29.80* 05/29/2014   LDLDIRECT 149.4  05/29/2014   ALT 42 05/29/2014   AST 33 05/29/2014   NA 137 05/29/2014   K 5.4* 05/29/2014   CL 101 05/29/2014   CREATININE 1.0 05/29/2014   BUN 12 05/29/2014   CO2 28 05/29/2014   TSH 3.22 05/29/2014   PSA 0.10 05/29/2014   INR 2.8 11/01/2014   HGBA1C 5.3 04/19/2007       Assessment & Plan:

## 2014-12-01 NOTE — Assessment & Plan Note (Signed)
2016 - off Lopressor

## 2014-12-13 ENCOUNTER — Ambulatory Visit (INDEPENDENT_AMBULATORY_CARE_PROVIDER_SITE_OTHER): Payer: 59 | Admitting: General Practice

## 2014-12-13 DIAGNOSIS — Z5181 Encounter for therapeutic drug level monitoring: Secondary | ICD-10-CM

## 2014-12-13 DIAGNOSIS — I2699 Other pulmonary embolism without acute cor pulmonale: Secondary | ICD-10-CM

## 2014-12-13 DIAGNOSIS — I82409 Acute embolism and thrombosis of unspecified deep veins of unspecified lower extremity: Secondary | ICD-10-CM

## 2014-12-13 DIAGNOSIS — I80209 Phlebitis and thrombophlebitis of unspecified deep vessels of unspecified lower extremity: Secondary | ICD-10-CM

## 2014-12-13 LAB — POCT INR: INR: 2.7

## 2014-12-13 NOTE — Progress Notes (Signed)
Pre visit review using our clinic review tool, if applicable. No additional management support is needed unless otherwise documented below in the visit note. 

## 2014-12-14 NOTE — Progress Notes (Signed)
I have reviewed and agree with the plan. 

## 2014-12-25 ENCOUNTER — Encounter: Payer: Self-pay | Admitting: Internal Medicine

## 2014-12-26 ENCOUNTER — Encounter: Payer: Self-pay | Admitting: Internal Medicine

## 2014-12-26 DIAGNOSIS — A6920 Lyme disease, unspecified: Secondary | ICD-10-CM | POA: Insufficient documentation

## 2015-01-11 ENCOUNTER — Encounter: Payer: Self-pay | Admitting: Internal Medicine

## 2015-01-12 MED ORDER — FUROSEMIDE 20 MG PO TABS: 20.0000 mg | ORAL_TABLET | Freq: Every day | ORAL | Status: DC

## 2015-01-13 ENCOUNTER — Other Ambulatory Visit: Payer: Self-pay | Admitting: *Deleted

## 2015-01-17 ENCOUNTER — Other Ambulatory Visit: Payer: Self-pay | Admitting: Internal Medicine

## 2015-01-24 ENCOUNTER — Ambulatory Visit (INDEPENDENT_AMBULATORY_CARE_PROVIDER_SITE_OTHER): Payer: 59 | Admitting: General Practice

## 2015-01-24 DIAGNOSIS — I82409 Acute embolism and thrombosis of unspecified deep veins of unspecified lower extremity: Secondary | ICD-10-CM | POA: Diagnosis not present

## 2015-01-24 DIAGNOSIS — Z5181 Encounter for therapeutic drug level monitoring: Secondary | ICD-10-CM

## 2015-01-24 DIAGNOSIS — I2699 Other pulmonary embolism without acute cor pulmonale: Secondary | ICD-10-CM

## 2015-01-24 LAB — POCT INR: INR: 2.5

## 2015-01-24 NOTE — Progress Notes (Signed)
Pre visit review using our clinic review tool, if applicable. No additional management support is needed unless otherwise documented below in the visit note. 

## 2015-01-24 NOTE — Progress Notes (Signed)
I have reviewed and agree with the plan. 

## 2015-02-27 ENCOUNTER — Ambulatory Visit (INDEPENDENT_AMBULATORY_CARE_PROVIDER_SITE_OTHER): Payer: 59 | Admitting: Internal Medicine

## 2015-02-27 ENCOUNTER — Other Ambulatory Visit (INDEPENDENT_AMBULATORY_CARE_PROVIDER_SITE_OTHER): Payer: 59

## 2015-02-27 ENCOUNTER — Encounter: Payer: Self-pay | Admitting: Internal Medicine

## 2015-02-27 VITALS — BP 130/88 | HR 83 | Temp 97.6°F | Wt 306.0 lb

## 2015-02-27 DIAGNOSIS — M79671 Pain in right foot: Secondary | ICD-10-CM | POA: Diagnosis not present

## 2015-02-27 DIAGNOSIS — A6920 Lyme disease, unspecified: Secondary | ICD-10-CM

## 2015-02-27 DIAGNOSIS — D6489 Other specified anemias: Secondary | ICD-10-CM

## 2015-02-27 DIAGNOSIS — M79604 Pain in right leg: Secondary | ICD-10-CM | POA: Insufficient documentation

## 2015-02-27 LAB — BASIC METABOLIC PANEL
BUN: 14 mg/dL (ref 6–23)
CO2: 29 mEq/L (ref 19–32)
Calcium: 9.3 mg/dL (ref 8.4–10.5)
Chloride: 98 mEq/L (ref 96–112)
Creatinine, Ser: 1.05 mg/dL (ref 0.40–1.50)
GFR: 75.72 mL/min (ref >60.00)
Glucose, Bld: 97 mg/dL (ref 70–99)
Potassium: 4.7 mEq/L (ref 3.5–5.1)
Sodium: 135 mEq/L (ref 135–145)

## 2015-02-27 LAB — CBC
HCT: 46.5 % (ref 39.0–52.0)
Hemoglobin: 16.2 g/dL (ref 13.0–17.0)
MCHC: 34.9 g/dL (ref 30.0–36.0)
MCV: 92 fl (ref 78.0–100.0)
Platelets: 352 10*3/uL (ref 150.0–400.0)
RBC: 5.05 Mil/uL (ref 4.22–5.81)
RDW: 13.1 % (ref 11.5–15.5)
WBC: 9.8 10*3/uL (ref 4.0–10.5)

## 2015-02-27 LAB — URIC ACID: Uric Acid, Serum: 8.4 mg/dL — ABNORMAL HIGH (ref 4.0–7.8)

## 2015-02-27 MED ORDER — PREDNISONE 10 MG PO TABS: ORAL_TABLET | ORAL | Status: DC

## 2015-02-27 NOTE — Assessment & Plan Note (Signed)
CBC

## 2015-02-27 NOTE — Assessment & Plan Note (Addendum)
9/16 probable R 1st MTP gout, acute Prednisone taper  Potential benefits of a short term steroid  use as well as potential risks w/coumadin  and complications were explained to the patient and were aknowledged. Labs  Pt declined Colchicine

## 2015-02-27 NOTE — Progress Notes (Signed)
Subjective:  Patient ID: Robert SCHWEGLER, male    DOB: 01-22-1951  Age: 64 y.o. MRN: [id number]  CC: Toe Pain   HPI Robert [name] presents for R big toe and foot pain since Sat - severe. Pt had Lyme disease treated w/Doxy in July 2016  Outpatient Prescriptions Prior to Visit  Medication Sig Dispense Refill  . albuterol (PROAIR HFA) 108 (90 BASE) MCG/ACT inhaler Inhale 2 puffs into the lungs every 6 (six) hours as needed for wheezing or shortness of breath. 3 Inhaler 3  . albuterol (PROVENTIL) (2.5 MG/3ML) 0.083% nebulizer solution Take 3 mLs (2.5 mg total) by nebulization every 6 (six) hours as needed for wheezing or shortness of breath. 150 mL 3  . cetirizine (ZYRTEC) 10 MG tablet Take 10 mg by mouth daily.     . cholecalciferol (VITAMIN D) 1000 UNITS tablet Take 1 tablet (1,000 Units total) by mouth daily. 100 tablet 3  . Cyanocobalamin (VITAMIN B-12) 5000 MCG SUBL Place 5,000 tablets under the tongue every morning.    . fish oil-omega-3 fatty acids 1000 MG capsule Take 2 g by mouth every morning.    . furosemide (LASIX) 20 MG tablet Take 1 tablet (20 mg total) by mouth daily. 90 tablet 3  . glucosamine-chondroitin 500-400 MG tablet Take 1 tablet by mouth every morning.    . nortriptyline (PAMELOR) 10 MG capsule Take 1 capsule (10 mg total) by mouth every evening. 90 capsule 3  . oxyCODONE (OXY IR/ROXICODONE) 5 MG immediate release tablet Take 1 tablet (5 mg total) by mouth every 8 (eight) hours as needed. 90 tablet 0  . triamcinolone cream (KENALOG) 0.5 % Apply 1 application topically 3 (three) times daily. 120 g 1  . warfarin (COUMADIN) 3 MG tablet As directed (Patient taking differently: Pt takes 3 mg mon wed fri, 6mg  Tuesday Thursday sat sun) 60 tablet 11  . warfarin (COUMADIN) 6 MG tablet USE AS DIRECTED 60 tablet 1   No facility-administered medications prior to visit.    ROS Review of Systems  Constitutional: Negative for appetite change, fatigue and unexpected weight  change.  HENT: Negative for congestion, nosebleeds, sneezing, sore throat and trouble swallowing.   Eyes: Negative for itching and visual disturbance.  Respiratory: Negative for cough.   Cardiovascular: Negative for chest pain, palpitations and leg swelling.  Gastrointestinal: Negative for nausea, diarrhea, blood in stool and abdominal distention.  Genitourinary: Negative for frequency and hematuria.  Musculoskeletal: Positive for joint swelling and gait problem. Negative for back pain and neck pain.  Skin: Positive for color change and rash.  Neurological: Negative for dizziness, tremors, speech difficulty and weakness.  Psychiatric/Behavioral: Positive for decreased concentration. Negative for suicidal ideas, sleep disturbance, dysphoric mood and agitation. The patient is not nervous/anxious.     Objective:  BP 130/88 mmHg  Pulse 83  Temp(Src) 97.6 F (36.4 C) (Oral)  Wt 306 lb (138.801 kg)  SpO2 93%  BP Readings from Last 3 Encounters:  02/27/15 130/88  12/01/14 122/76  05/29/14 140/90    Wt Readings from Last 3 Encounters:  02/27/15 306 lb (138.801 kg)  12/01/14 303 lb (137.44 kg)  05/29/14 305 lb (138.347 kg)    Physical Exam  Constitutional: He is oriented to person, place, and time. He appears well-developed. No distress.  NAD  HENT:  Mouth/Throat: Oropharynx is clear and moist.  Eyes: Conjunctivae are normal. Pupils are equal, round, and reactive to light.  Neck: Normal range of motion. No JVD present. No thyromegaly  present.  Cardiovascular: Normal rate, regular rhythm, normal heart sounds and intact distal pulses.  Exam reveals no gallop and no friction rub.   No murmur heard. Pulmonary/Chest: Effort normal and breath sounds normal. No respiratory distress. He has no wheezes. He has no rales. He exhibits no tenderness.  Abdominal: Soft. Bowel sounds are normal. He exhibits no distension and no mass. There is no tenderness. There is no rebound and no guarding.    Musculoskeletal: Normal range of motion. He exhibits edema and tenderness.  Lymphadenopathy:    He has no cervical adenopathy.  Neurological: He is alert and oriented to person, place, and time. He has normal reflexes. No cranial nerve deficit. He exhibits normal muscle tone. He displays a negative Romberg sign. Coordination abnormal. Gait normal.  Skin: Skin is warm and dry. No rash noted. There is erythema.  Psychiatric: He has a normal mood and affect. His behavior is normal. Judgment and thought content normal.    Lab Results  Component Value Date   WBC 9.4 05/29/2014   HGB 16.1 05/29/2014   HCT 47.2 05/29/2014   PLT 282.0 05/29/2014   GLUCOSE 113* 12/01/2014   CHOL 238* 05/29/2014   TRIG 346.0* 05/29/2014   HDL 29.80* 05/29/2014   LDLDIRECT 149.4 05/29/2014   ALT 42 05/29/2014   AST 33 05/29/2014   NA 137 12/01/2014   K 4.1 12/01/2014   CL 100 12/01/2014   CREATININE 1.07 12/01/2014   BUN 12 12/01/2014   CO2 32 12/01/2014   TSH 3.22 05/29/2014   PSA 0.10 05/29/2014   INR 2.5 01/24/2015   HGBA1C 5.3 04/19/2007    Ct Angio Chest Pe W/cm &/or Wo Cm  07/04/2013   CLINICAL DATA:  Abdominal pain  EXAM: CT ABDOMEN AND PELVIS WITH CONTRAST  TECHNIQUE: Multidetector CT imaging of the abdomen and pelvis was performed using the standard protocol following bolus administration of intravenous contrast.  CONTRAST:  132mL OMNIPAQUE IOHEXOL 350 MG/ML SOLN  COMPARISON:  Prior CT from 11/20/2010.  FINDINGS: CT CHEST:  No pathologically enlarged mediastinal, hilar, or axillary lymph nodes are identified.  The aortic great vessels demonstrate a normal contrast enhanced appearance. Heart size is within normal limits. Three-vessel coronary artery calcifications are present. No pericardial effusion.  Pulmonary arterial tree is well opacified. No filling defects are seen to suggest acute pulmonary embolism. Re-formatted imaging confirms these findings.  The lungs are clear without focal infiltrate,  pulmonary edema, or pleural effusion. Mild dependent atelectasis is seen within the visualized lung bases.  An 8 mm subpleural nodule is present within the posterior right lower lobe (series 7, image 50), indeterminate. No other pulmonary nodules or masses identified.  CT ABDOMEN AND PELVIS:  Subcentimeter hypodensities present within the left hepatic lobe are present, stable as compared to prior study, and most likely reflect benign hepatic cysts. Additional irregular hypodense lesion measuring 1.9 cm is present within the caudate lobe of the liver is also consistent with a benign cyst. The liver is otherwise unremarkable. Gallbladder is surgically absent. No biliary ductal dilatation. The spleen, adrenal glands, and pancreas demonstrate a normal contrast enhanced appearance.  Bilateral renal cysts are present, the largest of which is seen extending from the upper pole of the left kidney and measures 6.3 x 5.3 cm. No nephrolithiasis, hydronephrosis, or focal enhancing renal mass.  No evidence of bowel obstruction. No abnormal mucosal enhancement, wall thickening, or inflammatory fat stranding seen about the bowels. Surgical clips within the right lower quadrant are most consistent  with prior appendectomy.  Bladder is unremarkable.  Prostate is within normal limits.  No free air or fluid identified.  No pathologically enlarged intra-abdominal pelvic lymph nodes.  Study is not optimized for evaluation of the venous structures, however, no definite thrombus seen within the IVC or bilateral iliac veins.  Spinal fixation noted at the L5-S1 level. Grade 1 anterolisthesis of L4 on L5 this present. Multilevel degenerative changes present within the lower thoracic and lumbar spine. No acute osseous abnormality. No focal lytic or blastic osseous lesions.  IMPRESSION: CT CHEST:  1. No CT evidence of acute pulmonary embolism. No other acute abnormality identified within the thorax. 2. 8 mm subpleural right lower lobe nodule,  indeterminate, but stable as compared to prior CT from 11/20/2010.  3. Coronary artery calcifications. CT ABDOMEN AND PELVIS:  1. No acute intra-abdominal or pelvic process identified. No definite occlusive or nonocclusive thrombus identified within the IVC or iliac vessels. However, please note that this study is not optimized for evaluation of the venous structures due to timing of the contrast bolus. Correlation with lower extremity Doppler ultrasound could be performed as clinically indicated.   Electronically Signed   By: Jeannine Boga M.D.   On: 07/04/2013 16:35   Ct Abdomen Pelvis W Contrast  07/04/2013   CLINICAL DATA:  Abdominal pain  EXAM: CT ABDOMEN AND PELVIS WITH CONTRAST  TECHNIQUE: Multidetector CT imaging of the abdomen and pelvis was performed using the standard protocol following bolus administration of intravenous contrast.  CONTRAST:  173mL OMNIPAQUE IOHEXOL 350 MG/ML SOLN  COMPARISON:  Prior CT from 11/20/2010.  FINDINGS: CT CHEST:  No pathologically enlarged mediastinal, hilar, or axillary lymph nodes are identified.  The aortic great vessels demonstrate a normal contrast enhanced appearance. Heart size is within normal limits. Three-vessel coronary artery calcifications are present. No pericardial effusion.  Pulmonary arterial tree is well opacified. No filling defects are seen to suggest acute pulmonary embolism. Re-formatted imaging confirms these findings.  The lungs are clear without focal infiltrate, pulmonary edema, or pleural effusion. Mild dependent atelectasis is seen within the visualized lung bases.  An 8 mm subpleural nodule is present within the posterior right lower lobe (series 7, image 50), indeterminate. No other pulmonary nodules or masses identified.  CT ABDOMEN AND PELVIS:  Subcentimeter hypodensities present within the left hepatic lobe are present, stable as compared to prior study, and most likely reflect benign hepatic cysts. Additional irregular hypodense  lesion measuring 1.9 cm is present within the caudate lobe of the liver is also consistent with a benign cyst. The liver is otherwise unremarkable. Gallbladder is surgically absent. No biliary ductal dilatation. The spleen, adrenal glands, and pancreas demonstrate a normal contrast enhanced appearance.  Bilateral renal cysts are present, the largest of which is seen extending from the upper pole of the left kidney and measures 6.3 x 5.3 cm. No nephrolithiasis, hydronephrosis, or focal enhancing renal mass.  No evidence of bowel obstruction. No abnormal mucosal enhancement, wall thickening, or inflammatory fat stranding seen about the bowels. Surgical clips within the right lower quadrant are most consistent with prior appendectomy.  Bladder is unremarkable.  Prostate is within normal limits.  No free air or fluid identified.  No pathologically enlarged intra-abdominal pelvic lymph nodes.  Study is not optimized for evaluation of the venous structures, however, no definite thrombus seen within the IVC or bilateral iliac veins.  Spinal fixation noted at the L5-S1 level. Grade 1 anterolisthesis of L4 on L5 this present. Multilevel degenerative changes  present within the lower thoracic and lumbar spine. No acute osseous abnormality. No focal lytic or blastic osseous lesions.  IMPRESSION: CT CHEST:  1. No CT evidence of acute pulmonary embolism. No other acute abnormality identified within the thorax. 2. 8 mm subpleural right lower lobe nodule, indeterminate, but stable as compared to prior CT from 11/20/2010.  3. Coronary artery calcifications. CT ABDOMEN AND PELVIS:  1. No acute intra-abdominal or pelvic process identified. No definite occlusive or nonocclusive thrombus identified within the IVC or iliac vessels. However, please note that this study is not optimized for evaluation of the venous structures due to timing of the contrast bolus. Correlation with lower extremity Doppler ultrasound could be performed as  clinically indicated.   Electronically Signed   By: Jeannine Boga M.D.   On: 07/04/2013 16:35    Assessment & Plan:   There are no diagnoses linked to this encounter. I am having Mr. Basey maintain his cetirizine, fish oil-omega-3 fatty acids, glucosamine-chondroitin, Vitamin B-12, triamcinolone cream, warfarin, albuterol, cholecalciferol, nortriptyline, albuterol, warfarin, oxyCODONE, and furosemide.  No orders of the defined types were placed in this encounter.     Follow-up: No Follow-up on file.  Walker Kehr, MD

## 2015-02-27 NOTE — Progress Notes (Signed)
Pre visit review using our clinic review tool, if applicable. No additional management support is needed unless otherwise documented below in the visit note. 

## 2015-02-27 NOTE — Assessment & Plan Note (Signed)
Pt had Lyme disease treated w/Doxy in July 2016

## 2015-03-01 ENCOUNTER — Encounter: Payer: Self-pay | Admitting: Internal Medicine

## 2015-03-05 ENCOUNTER — Encounter: Payer: Self-pay | Admitting: Internal Medicine

## 2015-03-05 ENCOUNTER — Other Ambulatory Visit: Payer: Self-pay | Admitting: Internal Medicine

## 2015-03-05 MED ORDER — COLCHICINE 0.6 MG PO TABS: 0.6000 mg | ORAL_TABLET | Freq: Two times a day (BID) | ORAL | Status: DC

## 2015-03-05 NOTE — Telephone Encounter (Signed)
MD is going to rx some colchicine if no better pt will make a f/u with appt...Robert [name]

## 2015-03-06 ENCOUNTER — Ambulatory Visit (INDEPENDENT_AMBULATORY_CARE_PROVIDER_SITE_OTHER): Payer: 59 | Admitting: General Practice

## 2015-03-06 ENCOUNTER — Ambulatory Visit: Payer: 59

## 2015-03-06 DIAGNOSIS — Z23 Encounter for immunization: Secondary | ICD-10-CM

## 2015-03-06 DIAGNOSIS — Z5181 Encounter for therapeutic drug level monitoring: Secondary | ICD-10-CM | POA: Diagnosis not present

## 2015-03-06 DIAGNOSIS — I80209 Phlebitis and thrombophlebitis of unspecified deep vessels of unspecified lower extremity: Secondary | ICD-10-CM

## 2015-03-06 DIAGNOSIS — I82409 Acute embolism and thrombosis of unspecified deep veins of unspecified lower extremity: Secondary | ICD-10-CM | POA: Diagnosis not present

## 2015-03-06 DIAGNOSIS — I2699 Other pulmonary embolism without acute cor pulmonale: Secondary | ICD-10-CM | POA: Diagnosis not present

## 2015-03-06 LAB — POCT INR: INR: 2.3

## 2015-03-06 NOTE — Progress Notes (Signed)
Pre visit review using our clinic review tool, if applicable. No additional management support is needed unless otherwise documented below in the visit note. 

## 2015-03-06 NOTE — Progress Notes (Signed)
I have reviewed and agree with the plan. 

## 2015-04-02 ENCOUNTER — Ambulatory Visit: Payer: 59 | Admitting: Internal Medicine

## 2015-04-17 ENCOUNTER — Ambulatory Visit (INDEPENDENT_AMBULATORY_CARE_PROVIDER_SITE_OTHER): Payer: 59 | Admitting: General Practice

## 2015-04-17 DIAGNOSIS — I80209 Phlebitis and thrombophlebitis of unspecified deep vessels of unspecified lower extremity: Secondary | ICD-10-CM

## 2015-04-17 DIAGNOSIS — I2699 Other pulmonary embolism without acute cor pulmonale: Secondary | ICD-10-CM

## 2015-04-17 DIAGNOSIS — Z5181 Encounter for therapeutic drug level monitoring: Secondary | ICD-10-CM

## 2015-04-17 LAB — POCT INR: INR: 2.5

## 2015-04-17 NOTE — Progress Notes (Signed)
I have reviewed and agree with the plan. 

## 2015-04-17 NOTE — Progress Notes (Signed)
Pre visit review using our clinic review tool, if applicable. No additional management support is needed unless otherwise documented below in the visit note. 

## 2015-04-24 ENCOUNTER — Other Ambulatory Visit: Payer: Self-pay | Admitting: Internal Medicine

## 2015-05-16 ENCOUNTER — Encounter: Payer: Self-pay | Admitting: Internal Medicine

## 2015-05-16 ENCOUNTER — Ambulatory Visit (INDEPENDENT_AMBULATORY_CARE_PROVIDER_SITE_OTHER): Payer: 59 | Admitting: Internal Medicine

## 2015-05-16 VITALS — BP 130/82 | HR 77 | Temp 98.7°F | Resp 20 | Ht 71.0 in | Wt 304.2 lb

## 2015-05-16 DIAGNOSIS — I471 Supraventricular tachycardia: Secondary | ICD-10-CM

## 2015-05-16 DIAGNOSIS — J4520 Mild intermittent asthma, uncomplicated: Secondary | ICD-10-CM | POA: Diagnosis not present

## 2015-05-16 DIAGNOSIS — M1000 Idiopathic gout, unspecified site: Secondary | ICD-10-CM

## 2015-05-16 DIAGNOSIS — E538 Deficiency of other specified B group vitamins: Secondary | ICD-10-CM

## 2015-05-16 MED ORDER — ALLOPURINOL 100 MG PO TABS: 100.0000 mg | ORAL_TABLET | Freq: Every day | ORAL | Status: DC

## 2015-05-16 MED ORDER — COLCHICINE 0.6 MG PO TABS: 0.6000 mg | ORAL_TABLET | Freq: Two times a day (BID) | ORAL | Status: DC | PRN

## 2015-05-16 NOTE — Assessment & Plan Note (Signed)
On B12 

## 2015-05-16 NOTE — Patient Instructions (Signed)

## 2015-05-16 NOTE — Progress Notes (Signed)
Pre visit review using our clinic review tool, if applicable. No additional management support is needed unless otherwise documented below in the visit note. 

## 2015-05-16 NOTE — Assessment & Plan Note (Signed)
Breo and Anoro - intolerant 5/15 Better w/less Metoprolol

## 2015-05-20 NOTE — Progress Notes (Signed)
Subjective:  Patient ID: Robert [name], male    DOB: 02-04-1951  Age: 64 y.o. MRN: [id number]  CC: No chief complaint on file.   HPI Robert [name] presents for SVT, gout, B12 deficiency f/u  Outpatient Prescriptions Prior to Visit  Medication Sig Dispense Refill  . albuterol (PROAIR HFA) 108 (90 BASE) MCG/ACT inhaler Inhale 2 puffs into the lungs every 6 (six) hours as needed for wheezing or shortness of breath. 3 Inhaler 3  . albuterol (PROVENTIL) (2.5 MG/3ML) 0.083% nebulizer solution Take 3 mLs (2.5 mg total) by nebulization every 6 (six) hours as needed for wheezing or shortness of breath. 150 mL 3  . cetirizine (ZYRTEC) 10 MG tablet Take 10 mg by mouth daily.     . cholecalciferol (VITAMIN D) 1000 UNITS tablet Take 1 tablet (1,000 Units total) by mouth daily. 100 tablet 3  . Cyanocobalamin (VITAMIN B-12) 5000 MCG SUBL Place 5,000 tablets under the tongue every morning.    . fish oil-omega-3 fatty acids 1000 MG capsule Take 2 g by mouth every morning.    . furosemide (LASIX) 20 MG tablet Take 1 tablet (20 mg total) by mouth daily. 90 tablet 3  . glucosamine-chondroitin 500-400 MG tablet Take 1 tablet by mouth every morning.    . nortriptyline (PAMELOR) 10 MG capsule TAKE 1 CAPSULE BY MOUTH EVERY EVENING 90 capsule 3  . triamcinolone cream (KENALOG) 0.5 % Apply 1 application topically 3 (three) times daily. 120 g 1  . warfarin (COUMADIN) 3 MG tablet As directed (Patient taking differently: Pt takes 3 mg mon wed fri, 6mg  Tuesday Thursday sat sun) 60 tablet 11  . warfarin (COUMADIN) 6 MG tablet USE AS DIRECTED 60 tablet 1  . colchicine 0.6 MG tablet Take 1 tablet (0.6 mg total) by mouth 2 (two) times daily. 60 tablet 3  . predniSONE (DELTASONE) 10 MG tablet Prednisone 10 mg: take 4 tabs a day x 1 day; then 3 tabs a day x 1 day; then 2 tabs a day x 1 day, then 1 tab a day x 1 day, then stop. Take pc.  Use prn gout (Patient not taking: Reported on 05/16/2015) 60 tablet 1   No  facility-administered medications prior to visit.    ROS Review of Systems  Constitutional: Negative for appetite change, fatigue and unexpected weight change.  HENT: Negative for congestion, nosebleeds, sneezing, sore throat and trouble swallowing.   Eyes: Negative for itching and visual disturbance.  Respiratory: Negative for cough.   Cardiovascular: Positive for leg swelling. Negative for chest pain and palpitations.  Gastrointestinal: Negative for nausea, diarrhea, blood in stool and abdominal distention.  Genitourinary: Negative for frequency and hematuria.  Musculoskeletal: Positive for back pain, arthralgias and gait problem. Negative for joint swelling and neck pain.  Skin: Negative for rash.  Neurological: Negative for dizziness, tremors, speech difficulty and weakness.  Psychiatric/Behavioral: Negative for sleep disturbance, dysphoric mood, decreased concentration and agitation. The patient is not nervous/anxious.     Objective:  BP 130/82 mmHg  Pulse 77  Temp(Src) 98.7 F (37.1 C) (Oral)  Resp 20  Ht 5\' 11"  (1.803 m)  Wt 304 lb 4 oz (138.007 kg)  BMI 42.45 kg/m2  SpO2 98%  BP Readings from Last 3 Encounters:  05/16/15 130/82  02/27/15 130/88  12/01/14 122/76    Wt Readings from Last 3 Encounters:  05/16/15 304 lb 4 oz (138.007 kg)  02/27/15 306 lb (138.801 kg)  12/01/14 303 lb (137.44 kg)  Physical Exam  Constitutional: He is oriented to person, place, and time. He appears well-developed. No distress.  NAD  HENT:  Mouth/Throat: Oropharynx is clear and moist.  Eyes: Conjunctivae are normal. Pupils are equal, round, and reactive to light.  Neck: Normal range of motion. No JVD present. No thyromegaly present.  Cardiovascular: Normal rate, regular rhythm, normal heart sounds and intact distal pulses.  Exam reveals no gallop and no friction rub.   No murmur heard. Pulmonary/Chest: Effort normal and breath sounds normal. No respiratory distress. He has no  wheezes. He has no rales. He exhibits no tenderness.  Abdominal: Soft. Bowel sounds are normal. He exhibits no distension and no mass. There is no tenderness. There is no rebound and no guarding.  Musculoskeletal: Normal range of motion. He exhibits edema. He exhibits no tenderness.  Lymphadenopathy:    He has no cervical adenopathy.  Neurological: He is alert and oriented to person, place, and time. He has normal reflexes. No cranial nerve deficit. He exhibits normal muscle tone. He displays a negative Romberg sign. Coordination and gait normal.  Skin: Skin is warm and dry. No rash noted.  Psychiatric: He has a normal mood and affect. His behavior is normal. Judgment and thought content normal.  Obese  Lab Results  Component Value Date   WBC 9.8 02/27/2015   HGB 16.2 02/27/2015   HCT 46.5 02/27/2015   PLT 352.0 02/27/2015   GLUCOSE 97 02/27/2015   CHOL 238* 05/29/2014   TRIG 346.0* 05/29/2014   HDL 29.80* 05/29/2014   LDLDIRECT 149.4 05/29/2014   ALT 42 05/29/2014   AST 33 05/29/2014   NA 135 02/27/2015   K 4.7 02/27/2015   CL 98 02/27/2015   CREATININE 1.05 02/27/2015   BUN 14 02/27/2015   CO2 29 02/27/2015   TSH 3.22 05/29/2014   PSA 0.10 05/29/2014   INR 2.5 04/17/2015   HGBA1C 5.3 04/19/2007    Ct Angio Chest Pe W/cm &/or Wo Cm  07/04/2013  CLINICAL DATA:  Abdominal pain EXAM: CT ABDOMEN AND PELVIS WITH CONTRAST TECHNIQUE: Multidetector CT imaging of the abdomen and pelvis was performed using the standard protocol following bolus administration of intravenous contrast. CONTRAST:  156mL OMNIPAQUE IOHEXOL 350 MG/ML SOLN COMPARISON:  Prior CT from 11/20/2010. FINDINGS: CT CHEST: No pathologically enlarged mediastinal, hilar, or axillary lymph nodes are identified. The aortic great vessels demonstrate a normal contrast enhanced appearance. Heart size is within normal limits. Three-vessel coronary artery calcifications are present. No pericardial effusion. Pulmonary arterial tree  is well opacified. No filling defects are seen to suggest acute pulmonary embolism. Re-formatted imaging confirms these findings. The lungs are clear without focal infiltrate, pulmonary edema, or pleural effusion. Mild dependent atelectasis is seen within the visualized lung bases. An 8 mm subpleural nodule is present within the posterior right lower lobe (series 7, image 50), indeterminate. No other pulmonary nodules or masses identified. CT ABDOMEN AND PELVIS: Subcentimeter hypodensities present within the left hepatic lobe are present, stable as compared to prior study, and most likely reflect benign hepatic cysts. Additional irregular hypodense lesion measuring 1.9 cm is present within the caudate lobe of the liver is also consistent with a benign cyst. The liver is otherwise unremarkable. Gallbladder is surgically absent. No biliary ductal dilatation. The spleen, adrenal glands, and pancreas demonstrate a normal contrast enhanced appearance. Bilateral renal cysts are present, the largest of which is seen extending from the upper pole of the left kidney and measures 6.3 x 5.3 cm. No nephrolithiasis,  hydronephrosis, or focal enhancing renal mass. No evidence of bowel obstruction. No abnormal mucosal enhancement, wall thickening, or inflammatory fat stranding seen about the bowels. Surgical clips within the right lower quadrant are most consistent with prior appendectomy. Bladder is unremarkable.  Prostate is within normal limits. No free air or fluid identified. No pathologically enlarged intra-abdominal pelvic lymph nodes. Study is not optimized for evaluation of the venous structures, however, no definite thrombus seen within the IVC or bilateral iliac veins. Spinal fixation noted at the L5-S1 level. Grade 1 anterolisthesis of L4 on L5 this present. Multilevel degenerative changes present within the lower thoracic and lumbar spine. No acute osseous abnormality. No focal lytic or blastic osseous lesions.  IMPRESSION: CT CHEST: 1. No CT evidence of acute pulmonary embolism. No other acute abnormality identified within the thorax. 2. 8 mm subpleural right lower lobe nodule, indeterminate, but stable as compared to prior CT from 11/20/2010. 3. Coronary artery calcifications. CT ABDOMEN AND PELVIS: 1. No acute intra-abdominal or pelvic process identified. No definite occlusive or nonocclusive thrombus identified within the IVC or iliac vessels. However, please note that this study is not optimized for evaluation of the venous structures due to timing of the contrast bolus. Correlation with lower extremity Doppler ultrasound could be performed as clinically indicated. Electronically Signed   By: Jeannine Boga M.D.   On: 07/04/2013 16:35   Ct Abdomen Pelvis W Contrast  07/04/2013  CLINICAL DATA:  Abdominal pain EXAM: CT ABDOMEN AND PELVIS WITH CONTRAST TECHNIQUE: Multidetector CT imaging of the abdomen and pelvis was performed using the standard protocol following bolus administration of intravenous contrast. CONTRAST:  166mL OMNIPAQUE IOHEXOL 350 MG/ML SOLN COMPARISON:  Prior CT from 11/20/2010. FINDINGS: CT CHEST: No pathologically enlarged mediastinal, hilar, or axillary lymph nodes are identified. The aortic great vessels demonstrate a normal contrast enhanced appearance. Heart size is within normal limits. Three-vessel coronary artery calcifications are present. No pericardial effusion. Pulmonary arterial tree is well opacified. No filling defects are seen to suggest acute pulmonary embolism. Re-formatted imaging confirms these findings. The lungs are clear without focal infiltrate, pulmonary edema, or pleural effusion. Mild dependent atelectasis is seen within the visualized lung bases. An 8 mm subpleural nodule is present within the posterior right lower lobe (series 7, image 50), indeterminate. No other pulmonary nodules or masses identified. CT ABDOMEN AND PELVIS: Subcentimeter hypodensities present  within the left hepatic lobe are present, stable as compared to prior study, and most likely reflect benign hepatic cysts. Additional irregular hypodense lesion measuring 1.9 cm is present within the caudate lobe of the liver is also consistent with a benign cyst. The liver is otherwise unremarkable. Gallbladder is surgically absent. No biliary ductal dilatation. The spleen, adrenal glands, and pancreas demonstrate a normal contrast enhanced appearance. Bilateral renal cysts are present, the largest of which is seen extending from the upper pole of the left kidney and measures 6.3 x 5.3 cm. No nephrolithiasis, hydronephrosis, or focal enhancing renal mass. No evidence of bowel obstruction. No abnormal mucosal enhancement, wall thickening, or inflammatory fat stranding seen about the bowels. Surgical clips within the right lower quadrant are most consistent with prior appendectomy. Bladder is unremarkable.  Prostate is within normal limits. No free air or fluid identified. No pathologically enlarged intra-abdominal pelvic lymph nodes. Study is not optimized for evaluation of the venous structures, however, no definite thrombus seen within the IVC or bilateral iliac veins. Spinal fixation noted at the L5-S1 level. Grade 1 anterolisthesis of L4 on L5 this  present. Multilevel degenerative changes present within the lower thoracic and lumbar spine. No acute osseous abnormality. No focal lytic or blastic osseous lesions. IMPRESSION: CT CHEST: 1. No CT evidence of acute pulmonary embolism. No other acute abnormality identified within the thorax. 2. 8 mm subpleural right lower lobe nodule, indeterminate, but stable as compared to prior CT from 11/20/2010. 3. Coronary artery calcifications. CT ABDOMEN AND PELVIS: 1. No acute intra-abdominal or pelvic process identified. No definite occlusive or nonocclusive thrombus identified within the IVC or iliac vessels. However, please note that this study is not optimized for  evaluation of the venous structures due to timing of the contrast bolus. Correlation with lower extremity Doppler ultrasound could be performed as clinically indicated. Electronically Signed   By: Jeannine Boga M.D.   On: 07/04/2013 16:35    Assessment & Plan:   Diagnoses and all orders for this visit:  Asthma, mild intermittent, uncomplicated  SVT (supraventricular tachycardia) (HCC)  Idiopathic gout, unspecified chronicity, unspecified site -     Basic metabolic panel; Future -     Uric acid; Future  B12 deficiency  Other orders -     allopurinol (ZYLOPRIM) 100 MG tablet; Take 1 tablet (100 mg total) by mouth daily. -     colchicine 0.6 MG tablet; Take 1 tablet (0.6 mg total) by mouth 2 (two) times daily as needed.   I have discontinued Mr. Villarruel's predniSONE. I have also changed his colchicine. Additionally, I am having him start on allopurinol. Lastly, I am having him maintain his cetirizine, fish oil-omega-3 fatty acids, glucosamine-chondroitin, Vitamin B-12, triamcinolone cream, warfarin, albuterol, cholecalciferol, albuterol, warfarin, furosemide, and nortriptyline.  Meds ordered this encounter  Medications  . allopurinol (ZYLOPRIM) 100 MG tablet    Sig: Take 1 tablet (100 mg total) by mouth daily.    Dispense:  90 tablet    Refill:  3  . colchicine 0.6 MG tablet    Sig: Take 1 tablet (0.6 mg total) by mouth 2 (two) times daily as needed.    Dispense:  60 tablet    Refill:  3     Follow-up: Return in about 3 months (around 08/16/2015) for a follow-up visit.  Walker Kehr, MD

## 2015-05-29 ENCOUNTER — Ambulatory Visit (INDEPENDENT_AMBULATORY_CARE_PROVIDER_SITE_OTHER): Payer: 59 | Admitting: General Practice

## 2015-05-29 DIAGNOSIS — I2699 Other pulmonary embolism without acute cor pulmonale: Secondary | ICD-10-CM | POA: Diagnosis not present

## 2015-05-29 DIAGNOSIS — I80209 Phlebitis and thrombophlebitis of unspecified deep vessels of unspecified lower extremity: Secondary | ICD-10-CM

## 2015-05-29 DIAGNOSIS — Z5181 Encounter for therapeutic drug level monitoring: Secondary | ICD-10-CM

## 2015-05-29 LAB — POCT INR: INR: 1.9

## 2015-05-29 NOTE — Progress Notes (Signed)
I have reviewed and agree with the plan. 

## 2015-05-29 NOTE — Progress Notes (Signed)
Pre visit review using our clinic review tool, if applicable. No additional management support is needed unless otherwise documented below in the visit note. 

## 2015-06-07 ENCOUNTER — Other Ambulatory Visit: Payer: Self-pay

## 2015-06-07 MED ORDER — ALBUTEROL SULFATE (2.5 MG/3ML) 0.083% IN NEBU: 2.5000 mg | INHALATION_SOLUTION | Freq: Four times a day (QID) | RESPIRATORY_TRACT | Status: DC | PRN

## 2015-06-08 ENCOUNTER — Encounter: Payer: Self-pay | Admitting: Internal Medicine

## 2015-06-08 ENCOUNTER — Other Ambulatory Visit: Payer: Self-pay | Admitting: *Deleted

## 2015-06-08 MED ORDER — ALBUTEROL SULFATE (2.5 MG/3ML) 0.083% IN NEBU: 2.5000 mg | INHALATION_SOLUTION | Freq: Four times a day (QID) | RESPIRATORY_TRACT | Status: DC | PRN

## 2015-06-09 ENCOUNTER — Other Ambulatory Visit: Payer: Self-pay | Admitting: Internal Medicine

## 2015-06-09 MED ORDER — CLOBETASOL PROPIONATE 0.05 % EX CREA: 1.0000 "application " | TOPICAL_CREAM | Freq: Two times a day (BID) | CUTANEOUS | Status: DC

## 2015-06-13 ENCOUNTER — Encounter: Payer: Self-pay | Admitting: Internal Medicine

## 2015-06-13 NOTE — Telephone Encounter (Signed)
Pt called to check up on this request. Please call pt once its done.

## 2015-06-14 ENCOUNTER — Telehealth: Payer: Self-pay | Admitting: Internal Medicine

## 2015-06-14 NOTE — Telephone Encounter (Signed)
OK to fill this prescription with additional refills x0. I thought I forwarded you a message a couple days ago. Thank you!

## 2015-06-14 NOTE — Telephone Encounter (Signed)
Patient's wife called to follow up on request for Oxycodone HCL 5 MG. There were 2 emails sent  informaed her that dr Camila Li has gone for the day. She requests that we ask another provider to fill and give her a call.

## 2015-06-15 ENCOUNTER — Other Ambulatory Visit: Payer: Self-pay | Admitting: Internal Medicine

## 2015-06-15 MED ORDER — OXYCODONE HCL 5 MG PO TABS: 5.0000 mg | ORAL_TABLET | Freq: Three times a day (TID) | ORAL | Status: DC | PRN

## 2015-06-15 NOTE — Telephone Encounter (Signed)
Rx printed/upfront for p/u.. Pt informed  

## 2015-07-07 ENCOUNTER — Other Ambulatory Visit: Payer: Self-pay | Admitting: Internal Medicine

## 2015-07-10 ENCOUNTER — Ambulatory Visit (INDEPENDENT_AMBULATORY_CARE_PROVIDER_SITE_OTHER): Payer: 59 | Admitting: General Practice

## 2015-07-10 DIAGNOSIS — Z5181 Encounter for therapeutic drug level monitoring: Secondary | ICD-10-CM

## 2015-07-10 LAB — POCT INR: INR: 2.2

## 2015-07-10 NOTE — Progress Notes (Signed)
Pre visit review using our clinic review tool, if applicable. No additional management support is needed unless otherwise documented below in the visit note. 

## 2015-07-10 NOTE — Progress Notes (Signed)
I have reviewed and agree with the plan. 

## 2015-08-14 ENCOUNTER — Telehealth: Payer: Self-pay | Admitting: *Deleted

## 2015-08-14 MED ORDER — PROMETHAZINE HCL 25 MG RE SUPP: 25.0000 mg | Freq: Four times a day (QID) | RECTAL | Status: DC | PRN

## 2015-08-14 NOTE — Telephone Encounter (Signed)
Left msg on triage stating husband has been vomiting pretty much since 12 last night, he is not able to keep anything down. Wanting to know can md rx some suppositories to help him from vomiting/nausea...[name]

## 2015-08-14 NOTE — Telephone Encounter (Signed)
Will do Go to ER if worse Thx

## 2015-08-15 NOTE — Telephone Encounter (Signed)
Called pt wife no answer LMOM md sent rx to [facility].../lmb

## 2015-08-17 ENCOUNTER — Ambulatory Visit: Payer: 59 | Admitting: Internal Medicine

## 2015-08-20 ENCOUNTER — Other Ambulatory Visit: Payer: Self-pay | Admitting: Internal Medicine

## 2015-08-21 ENCOUNTER — Telehealth: Payer: Self-pay | Admitting: Internal Medicine

## 2015-08-21 NOTE — Telephone Encounter (Signed)
OK to fill this prescription with additional refills x0 Thank you!  

## 2015-08-21 NOTE — Telephone Encounter (Signed)
Pt's wife called in checking on the request for oxyCODONE (OXY IR/ROXICODONE) 5 MG immediate release tablet [id number]  She says pt emailed the request, but I don't see it. He is having some back pain from the throwing up he was doing. She is going to be in town in a couple of hours and she is hoping to pick it up then, since they live in [place]. She can be reached at [phone]

## 2015-08-22 MED ORDER — OXYCODONE HCL 5 MG PO TABS: 5.0000 mg | ORAL_TABLET | Freq: Three times a day (TID) | ORAL | Status: DC | PRN

## 2015-08-22 NOTE — Telephone Encounter (Signed)
Called pt wife no answer LMOM rx ready for pick-up.Marland KitchenJohny Chess

## 2015-08-24 ENCOUNTER — Ambulatory Visit (INDEPENDENT_AMBULATORY_CARE_PROVIDER_SITE_OTHER): Payer: 59 | Admitting: General Practice

## 2015-08-24 DIAGNOSIS — I80209 Phlebitis and thrombophlebitis of unspecified deep vessels of unspecified lower extremity: Secondary | ICD-10-CM

## 2015-08-24 DIAGNOSIS — Z5181 Encounter for therapeutic drug level monitoring: Secondary | ICD-10-CM | POA: Diagnosis not present

## 2015-08-24 DIAGNOSIS — I2699 Other pulmonary embolism without acute cor pulmonale: Secondary | ICD-10-CM

## 2015-08-24 LAB — POCT INR: INR: 1.8

## 2015-08-24 NOTE — Progress Notes (Signed)
I have reviewed and agree with the plan. 

## 2015-08-24 NOTE — Progress Notes (Signed)
Pre visit review using our clinic review tool, if applicable. No additional management support is needed unless otherwise documented below in the visit note. 

## 2015-09-25 ENCOUNTER — Telehealth: Payer: Self-pay | Admitting: Internal Medicine

## 2015-09-25 NOTE — Telephone Encounter (Signed)
Is requesting oxycodone refill

## 2015-09-26 NOTE — Telephone Encounter (Signed)
Spouse has called back in regards.  She will be picking up script.  She can not pick up after 2pm because of her schedule today.

## 2015-09-27 NOTE — Telephone Encounter (Signed)
Wife called again about picking up this med

## 2015-09-27 NOTE — Telephone Encounter (Signed)
OK to fill this prescription with additional refills x0 Thank you!  

## 2015-09-28 ENCOUNTER — Ambulatory Visit: Payer: 59

## 2015-09-28 MED ORDER — OXYCODONE HCL 5 MG PO TABS: 5.0000 mg | ORAL_TABLET | Freq: Three times a day (TID) | ORAL | Status: DC | PRN

## 2015-09-28 NOTE — Telephone Encounter (Signed)
Notified pt wife rx ready for pick-up../lmb 

## 2015-10-03 ENCOUNTER — Ambulatory Visit: Payer: 59

## 2015-10-12 ENCOUNTER — Other Ambulatory Visit: Payer: Self-pay | Admitting: General Practice

## 2015-10-12 ENCOUNTER — Ambulatory Visit (INDEPENDENT_AMBULATORY_CARE_PROVIDER_SITE_OTHER): Payer: 59 | Admitting: General Practice

## 2015-10-12 DIAGNOSIS — Z5181 Encounter for therapeutic drug level monitoring: Secondary | ICD-10-CM

## 2015-10-12 DIAGNOSIS — I2699 Other pulmonary embolism without acute cor pulmonale: Secondary | ICD-10-CM

## 2015-10-12 DIAGNOSIS — I80209 Phlebitis and thrombophlebitis of unspecified deep vessels of unspecified lower extremity: Secondary | ICD-10-CM

## 2015-10-12 LAB — POCT INR: INR: 2.5

## 2015-10-12 MED ORDER — WARFARIN SODIUM 6 MG PO TABS: ORAL_TABLET | ORAL | Status: DC

## 2015-10-12 MED ORDER — WARFARIN SODIUM 3 MG PO TABS: ORAL_TABLET | ORAL | Status: DC

## 2015-10-12 NOTE — Progress Notes (Signed)
I have reviewed and agree with the plan. 

## 2015-10-12 NOTE — Progress Notes (Signed)
Pre visit review using our clinic review tool, if applicable. No additional management support is needed unless otherwise documented below in the visit note. 

## 2015-11-02 ENCOUNTER — Other Ambulatory Visit: Payer: Self-pay | Admitting: Internal Medicine

## 2015-11-07 ENCOUNTER — Telehealth: Payer: Self-pay | Admitting: Internal Medicine

## 2015-11-07 NOTE — Telephone Encounter (Signed)
Spouse states that patient requesting oxycodone refill through mychart on 12th.  Please follow up with patient and or spouse in regards.

## 2015-11-07 NOTE — Telephone Encounter (Signed)
OK to fill this prescription with additional refills x0 Thank you!  

## 2015-11-08 MED ORDER — OXYCODONE HCL 5 MG PO TABS: 5.0000 mg | ORAL_TABLET | Freq: Three times a day (TID) | ORAL | Status: DC | PRN

## 2015-11-08 NOTE — Telephone Encounter (Signed)
Rx printed/signed/upfront for p/u. Left detailed mess informing pt.  

## 2015-11-23 ENCOUNTER — Ambulatory Visit (INDEPENDENT_AMBULATORY_CARE_PROVIDER_SITE_OTHER): Payer: 59 | Admitting: General Practice

## 2015-11-23 DIAGNOSIS — I80209 Phlebitis and thrombophlebitis of unspecified deep vessels of unspecified lower extremity: Secondary | ICD-10-CM

## 2015-11-23 DIAGNOSIS — I2699 Other pulmonary embolism without acute cor pulmonale: Secondary | ICD-10-CM

## 2015-11-23 DIAGNOSIS — Z5181 Encounter for therapeutic drug level monitoring: Secondary | ICD-10-CM

## 2015-11-23 LAB — POCT INR: INR: 1.7

## 2015-11-23 NOTE — Progress Notes (Signed)
I have reviewed and agree with the plan. 

## 2015-11-23 NOTE — Progress Notes (Signed)
Pre visit review using our clinic review tool, if applicable. No additional management support is needed unless otherwise documented below in the visit note. 

## 2015-12-18 ENCOUNTER — Encounter: Payer: 59 | Admitting: Internal Medicine

## 2015-12-26 ENCOUNTER — Telehealth: Payer: Self-pay | Admitting: Emergency Medicine

## 2015-12-26 MED ORDER — OXYCODONE HCL 5 MG PO TABS: 5.0000 mg | ORAL_TABLET | Freq: Three times a day (TID) | ORAL | Status: DC | PRN

## 2015-12-26 NOTE — Telephone Encounter (Signed)
Patients wife called and wants to know if she can get a prescription for him. Medication is oxyCODONE (OXY IR/ROXICODONE) 5 MG immediate release tablet. Please give her a call back. Thanks.

## 2015-12-26 NOTE — Telephone Encounter (Signed)
Please advise in PCP's absence. Thanks! 

## 2015-12-26 NOTE — Telephone Encounter (Signed)
Rx upfront for p/u. Pt's wife informed.

## 2015-12-26 NOTE — Telephone Encounter (Signed)
rx written

## 2016-01-04 ENCOUNTER — Ambulatory Visit (INDEPENDENT_AMBULATORY_CARE_PROVIDER_SITE_OTHER): Payer: 59 | Admitting: General Practice

## 2016-01-04 DIAGNOSIS — I80209 Phlebitis and thrombophlebitis of unspecified deep vessels of unspecified lower extremity: Secondary | ICD-10-CM

## 2016-01-04 DIAGNOSIS — Z5181 Encounter for therapeutic drug level monitoring: Secondary | ICD-10-CM

## 2016-01-04 DIAGNOSIS — I2699 Other pulmonary embolism without acute cor pulmonale: Secondary | ICD-10-CM

## 2016-01-04 LAB — POCT INR: INR: 2.1

## 2016-01-04 NOTE — Progress Notes (Signed)
Pre visit review using our clinic review tool, if applicable. No additional management support is needed unless otherwise documented below in the visit note. 

## 2016-01-04 NOTE — Progress Notes (Signed)
I have reviewed and agree with the plan. 

## 2016-01-06 ENCOUNTER — Other Ambulatory Visit: Payer: Self-pay | Admitting: Internal Medicine

## 2016-02-05 ENCOUNTER — Telehealth: Payer: Self-pay | Admitting: Emergency Medicine

## 2016-02-05 NOTE — Telephone Encounter (Signed)
Pt needs a prescription refill on oxyCODONE (OXY IR/ROXICODONE) 5 MG immediate release tablet. Please follow up thanks.

## 2016-02-06 MED ORDER — OXYCODONE HCL 5 MG PO TABS: 5.0000 mg | ORAL_TABLET | Freq: Three times a day (TID) | ORAL | 0 refills | Status: DC | PRN

## 2016-02-06 NOTE — Telephone Encounter (Signed)
OK to fill this prescription with additional refills x0 OV q 3 mo Thank you!  

## 2016-02-06 NOTE — Telephone Encounter (Signed)
Rx printed/signed/upfront for p/u. Pt's wife informed. OV is scheduled 02/18/16.

## 2016-02-15 ENCOUNTER — Ambulatory Visit (INDEPENDENT_AMBULATORY_CARE_PROVIDER_SITE_OTHER): Payer: 59 | Admitting: General Practice

## 2016-02-15 DIAGNOSIS — Z5181 Encounter for therapeutic drug level monitoring: Secondary | ICD-10-CM

## 2016-02-15 DIAGNOSIS — I80209 Phlebitis and thrombophlebitis of unspecified deep vessels of unspecified lower extremity: Secondary | ICD-10-CM

## 2016-02-15 DIAGNOSIS — I2699 Other pulmonary embolism without acute cor pulmonale: Secondary | ICD-10-CM

## 2016-02-15 LAB — POCT INR: INR: 1.9

## 2016-02-15 NOTE — Progress Notes (Signed)
I have reviewed and agree with the plan. 

## 2016-02-18 ENCOUNTER — Other Ambulatory Visit (INDEPENDENT_AMBULATORY_CARE_PROVIDER_SITE_OTHER): Payer: 59

## 2016-02-18 ENCOUNTER — Encounter: Payer: Self-pay | Admitting: Internal Medicine

## 2016-02-18 ENCOUNTER — Ambulatory Visit (INDEPENDENT_AMBULATORY_CARE_PROVIDER_SITE_OTHER): Payer: 59 | Admitting: Internal Medicine

## 2016-02-18 VITALS — BP 118/60 | HR 80 | Temp 98.5°F | Ht 71.0 in | Wt 290.0 lb

## 2016-02-18 DIAGNOSIS — R7989 Other specified abnormal findings of blood chemistry: Secondary | ICD-10-CM | POA: Diagnosis not present

## 2016-02-18 DIAGNOSIS — Z0000 Encounter for general adult medical examination without abnormal findings: Secondary | ICD-10-CM

## 2016-02-18 DIAGNOSIS — I872 Venous insufficiency (chronic) (peripheral): Secondary | ICD-10-CM | POA: Diagnosis not present

## 2016-02-18 DIAGNOSIS — I824Y9 Acute embolism and thrombosis of unspecified deep veins of unspecified proximal lower extremity: Secondary | ICD-10-CM

## 2016-02-18 DIAGNOSIS — Z23 Encounter for immunization: Secondary | ICD-10-CM | POA: Diagnosis not present

## 2016-02-18 LAB — LIPID PANEL
Cholesterol: 232 mg/dL — ABNORMAL HIGH (ref 0–200)
HDL: 29.4 mg/dL — ABNORMAL LOW (ref >39.00)
NonHDL: 202.32
Total CHOL/HDL Ratio: 8
Triglycerides: 214 mg/dL — ABNORMAL HIGH (ref 0.0–149.0)
VLDL: 42.8 mg/dL — ABNORMAL HIGH (ref 0.0–40.0)

## 2016-02-18 LAB — CBC WITH DIFFERENTIAL/PLATELET
Basophils Absolute: 0.1 10*3/uL (ref 0.0–0.1)
Basophils Relative: 0.7 % (ref 0.0–3.0)
Eosinophils Absolute: 1.1 10*3/uL — ABNORMAL HIGH (ref 0.0–0.7)
Eosinophils Relative: 10.5 % — ABNORMAL HIGH (ref 0.0–5.0)
HCT: 46.2 % (ref 39.0–52.0)
Hemoglobin: 16.2 g/dL (ref 13.0–17.0)
Lymphocytes Relative: 23.8 % (ref 12.0–46.0)
Lymphs Abs: 2.4 10*3/uL (ref 0.7–4.0)
MCHC: 35.1 g/dL (ref 30.0–36.0)
MCV: 91.6 fl (ref 78.0–100.0)
Monocytes Absolute: 0.9 10*3/uL (ref 0.1–1.0)
Monocytes Relative: 9 % (ref 3.0–12.0)
Neutro Abs: 5.6 10*3/uL (ref 1.4–7.7)
Neutrophils Relative %: 56 % (ref 43.0–77.0)
Platelets: 312 10*3/uL (ref 150.0–400.0)
RBC: 5.05 Mil/uL (ref 4.22–5.81)
RDW: 12.9 % (ref 11.5–15.5)
WBC: 10 10*3/uL (ref 4.0–10.5)

## 2016-02-18 LAB — URINALYSIS
Bilirubin Urine: NEGATIVE
Ketones, ur: NEGATIVE
Leukocytes, UA: NEGATIVE
Nitrite: NEGATIVE
Specific Gravity, Urine: 1.025 (ref 1.000–1.030)
Urine Glucose: NEGATIVE
Urobilinogen, UA: 0.2 (ref 0.0–1.0)
pH: 5.5 (ref 5.0–8.0)

## 2016-02-18 LAB — BASIC METABOLIC PANEL
BUN: 13 mg/dL (ref 6–23)
CO2: 30 mEq/L (ref 19–32)
Calcium: 9.2 mg/dL (ref 8.4–10.5)
Chloride: 98 mEq/L (ref 96–112)
Creatinine, Ser: 1.04 mg/dL (ref 0.40–1.50)
GFR: 76.32 mL/min (ref >60.00)
Glucose, Bld: 97 mg/dL (ref 70–99)
Potassium: 5.1 mEq/L (ref 3.5–5.1)
Sodium: 137 mEq/L (ref 135–145)

## 2016-02-18 LAB — HEPATIC FUNCTION PANEL
ALT: 24 U/L (ref 0–53)
AST: 23 U/L (ref 0–37)
Albumin: 4.2 g/dL (ref 3.5–5.2)
Alkaline Phosphatase: 74 U/L (ref 39–117)
Bilirubin, Direct: 0.2 mg/dL (ref 0.0–0.3)
Total Bilirubin: 0.7 mg/dL (ref 0.2–1.2)
Total Protein: 7.9 g/dL (ref 6.0–8.3)

## 2016-02-18 LAB — PSA: PSA: 0.11 ng/mL (ref 0.10–4.00)

## 2016-02-18 LAB — TSH: TSH: 3.18 u[IU]/mL (ref 0.35–4.50)

## 2016-02-18 MED ORDER — TRIAMCINOLONE ACETONIDE 0.5 % EX CREA: 1.0000 "application " | TOPICAL_CREAM | Freq: Three times a day (TID) | CUTANEOUS | 1 refills | Status: DC

## 2016-02-18 NOTE — Assessment & Plan Note (Signed)
Coumadin 

## 2016-02-18 NOTE — Assessment & Plan Note (Signed)
We discussed age appropriate health related issues, including available/recomended screening tests and vaccinations. We discussed a need for adhering to healthy diet and exercise. Labs/EKG were reviewed/ordered. All questions were answered. Loosing wt on diet - he was at 335 lbs.Marland KitchenMarland Kitchen

## 2016-02-18 NOTE — Assessment & Plan Note (Signed)
chronic R>L Kenalog prn

## 2016-02-18 NOTE — Progress Notes (Signed)
Pre visit review using our clinic review tool, if applicable. No additional management support is needed unless otherwise documented below in the visit note. 

## 2016-02-18 NOTE — Progress Notes (Signed)
Subjective:  Patient ID: Robert [name], male    DOB: 01/28/1951  Age: 65 y.o. MRN: [id number]  CC: No chief complaint on file.   HPI Robert [name] presents for a well exam. Loosing wt on diet - he was at 335 lbs...  Outpatient Medications Prior to Visit  Medication Sig Dispense Refill  . albuterol (PROVENTIL) (2.5 MG/3ML) 0.083% nebulizer solution Take 3 mLs (2.5 mg total) by nebulization every 6 (six) hours as needed for wheezing or shortness of breath. 150 mL 3  . allopurinol (ZYLOPRIM) 100 MG tablet Take 1 tablet (100 mg total) by mouth daily. 90 tablet 3  . cetirizine (ZYRTEC) 10 MG tablet Take 10 mg by mouth daily.     . clobetasol cream (TEMOVATE) [id number] % Apply 1 application topically 2 (two) times daily. 45 g 3  . colchicine 0.6 MG tablet Take 1 tablet (0.6 mg total) by mouth 2 (two) times daily as needed. 60 tablet 3  . Cyanocobalamin (VITAMIN B-12) 5000 MCG SUBL Place 5,000 tablets under the tongue every morning.    . fish oil-omega-3 fatty acids 1000 MG capsule Take 2 g by mouth every morning.    . furosemide (LASIX) 20 MG tablet Take 1 tablet (20 mg total) by mouth daily. Yearly physical is due w/labs must see MD for refills 90 tablet 0  . glucosamine-chondroitin 500-400 MG tablet Take 1 tablet by mouth every morning.    . nortriptyline (PAMELOR) 10 MG capsule TAKE 1 CAPSULE BY MOUTH EVERY EVENING 90 capsule 3  . oxyCODONE (OXY IR/ROXICODONE) 5 MG immediate release tablet Take 1 tablet (5 mg total) by mouth every 8 (eight) hours as needed. 90 tablet 0  . PROAIR HFA 108 (90 Base) MCG/ACT inhaler INHALE 2 PUFFS EVERY 6 HOURS AS NEEDED FOR WHEEZING/SHORTNESS OF BREATH 25.5 Inhaler 2  . promethazine (PHENERGAN) 25 MG suppository Place 1 suppository (25 mg total) rectally every 6 (six) hours as needed for nausea or vomiting. 12 each 1  . triamcinolone cream (KENALOG) 0.5 % Apply 1 application topically 3 (three) times daily. 120 g 1  . warfarin (COUMADIN) 3 MG tablet As  directed 45 tablet 1  . warfarin (COUMADIN) 6 MG tablet USE AS DIRECTED 60 tablet 1   No facility-administered medications prior to visit.     ROS Review of Systems  Constitutional: Negative for appetite change, fatigue and unexpected weight change.  HENT: Negative for congestion, nosebleeds, sneezing, sore throat and trouble swallowing.   Eyes: Negative for itching and visual disturbance.  Respiratory: Negative for cough.   Cardiovascular: Negative for chest pain, palpitations and leg swelling.  Gastrointestinal: Negative for abdominal distention, blood in stool, diarrhea and nausea.  Genitourinary: Negative for frequency and hematuria.  Musculoskeletal: Positive for back pain. Negative for gait problem, joint swelling and neck pain.  Skin: Negative for rash.  Neurological: Negative for dizziness, tremors, speech difficulty and weakness.  Psychiatric/Behavioral: Negative for agitation, dysphoric mood and sleep disturbance. The patient is not nervous/anxious.   L knee w/scars L lower leg w/mild erythema, trace edema Hose on the RLE   Objective:  BP 118/60 (BP Location: Left Arm, Patient Position: Sitting, Cuff Size: Large)   Pulse 80   Temp 98.5 F (36.9 C) (Oral)   Ht 5\' 11"  (1.803 m)   Wt 290 lb (131.5 kg)   SpO2 97%   BMI 40.45 kg/m   BP Readings from Last 3 Encounters:  02/18/16 118/60  05/16/15 130/82  02/27/15 130/88  Wt Readings from Last 3 Encounters:  02/18/16 290 lb (131.5 kg)  05/16/15 (!) 304 lb 4 oz (138 kg)  02/27/15 (!) 306 lb (138.8 kg)    Physical Exam  Constitutional: He is oriented to person, place, and time. He appears well-developed and well-nourished. No distress.  HENT:  Head: Normocephalic and atraumatic.  Right Ear: External ear normal.  Left Ear: External ear normal.  Nose: Nose normal.  Mouth/Throat: Oropharynx is clear and moist. No oropharyngeal exudate.  Eyes: Conjunctivae and EOM are normal. Pupils are equal, round, and reactive  to light. Right eye exhibits no discharge. Left eye exhibits no discharge. No scleral icterus.  Neck: Normal range of motion. Neck supple. No JVD present. No tracheal deviation present. No thyromegaly present.  Cardiovascular: Normal rate, regular rhythm, normal heart sounds and intact distal pulses.  Exam reveals no gallop and no friction rub.   No murmur heard. Pulmonary/Chest: Effort normal and breath sounds normal. No stridor. No respiratory distress. He has no wheezes. He has no rales. He exhibits no tenderness.  Abdominal: Soft. Bowel sounds are normal. He exhibits no distension and no mass. There is no tenderness. There is no rebound and no guarding.  Genitourinary: Rectum normal, prostate normal and penis normal. Rectal exam shows guaiac negative stool. No penile tenderness.  Musculoskeletal: Normal range of motion. He exhibits no edema or tenderness.  Lymphadenopathy:    He has no cervical adenopathy.  Neurological: He is alert and oriented to person, place, and time. He has normal reflexes. No cranial nerve deficit. He exhibits normal muscle tone. Coordination normal.  Skin: Skin is warm and dry. No rash noted. He is not diaphoretic. No erythema. No pallor.  Psychiatric: He has a normal mood and affect. His behavior is normal. Judgment and thought content normal.    Lab Results  Component Value Date   WBC 9.8 02/27/2015   HGB 16.2 02/27/2015   HCT 46.5 02/27/2015   PLT 352.0 02/27/2015   GLUCOSE 97 02/27/2015   CHOL 238 (H) 05/29/2014   TRIG 346.0 (H) 05/29/2014   HDL 29.80 (L) 05/29/2014   LDLDIRECT 149.4 05/29/2014   ALT 42 05/29/2014   AST 33 05/29/2014   NA 135 02/27/2015   K 4.7 02/27/2015   CL 98 02/27/2015   CREATININE 1.05 02/27/2015   BUN 14 02/27/2015   CO2 29 02/27/2015   TSH 3.22 05/29/2014   PSA 0.10 05/29/2014   INR 1.9 02/15/2016   HGBA1C 5.3 04/19/2007    Ct Angio Chest Pe W/cm &/or Wo Cm  Result Date: 07/04/2013 CLINICAL DATA:  Abdominal pain EXAM:  CT ABDOMEN AND PELVIS WITH CONTRAST TECHNIQUE: Multidetector CT imaging of the abdomen and pelvis was performed using the standard protocol following bolus administration of intravenous contrast. CONTRAST:  197mL OMNIPAQUE IOHEXOL 350 MG/ML SOLN COMPARISON:  Prior CT from 11/20/2010. FINDINGS: CT CHEST: No pathologically enlarged mediastinal, hilar, or axillary lymph nodes are identified. The aortic great vessels demonstrate a normal contrast enhanced appearance. Heart size is within normal limits. Three-vessel coronary artery calcifications are present. No pericardial effusion. Pulmonary arterial tree is well opacified. No filling defects are seen to suggest acute pulmonary embolism. Re-formatted imaging confirms these findings. The lungs are clear without focal infiltrate, pulmonary edema, or pleural effusion. Mild dependent atelectasis is seen within the visualized lung bases. An 8 mm subpleural nodule is present within the posterior right lower lobe (series 7, image 50), indeterminate. No other pulmonary nodules or masses identified. CT ABDOMEN AND PELVIS:  Subcentimeter hypodensities present within the left hepatic lobe are present, stable as compared to prior study, and most likely reflect benign hepatic cysts. Additional irregular hypodense lesion measuring 1.9 cm is present within the caudate lobe of the liver is also consistent with a benign cyst. The liver is otherwise unremarkable. Gallbladder is surgically absent. No biliary ductal dilatation. The spleen, adrenal glands, and pancreas demonstrate a normal contrast enhanced appearance. Bilateral renal cysts are present, the largest of which is seen extending from the upper pole of the left kidney and measures 6.3 x 5.3 cm. No nephrolithiasis, hydronephrosis, or focal enhancing renal mass. No evidence of bowel obstruction. No abnormal mucosal enhancement, wall thickening, or inflammatory fat stranding seen about the bowels. Surgical clips within the right  lower quadrant are most consistent with prior appendectomy. Bladder is unremarkable.  Prostate is within normal limits. No free air or fluid identified. No pathologically enlarged intra-abdominal pelvic lymph nodes. Study is not optimized for evaluation of the venous structures, however, no definite thrombus seen within the IVC or bilateral iliac veins. Spinal fixation noted at the L5-S1 level. Grade 1 anterolisthesis of L4 on L5 this present. Multilevel degenerative changes present within the lower thoracic and lumbar spine. No acute osseous abnormality. No focal lytic or blastic osseous lesions. IMPRESSION: CT CHEST: 1. No CT evidence of acute pulmonary embolism. No other acute abnormality identified within the thorax. 2. 8 mm subpleural right lower lobe nodule, indeterminate, but stable as compared to prior CT from 11/20/2010. 3. Coronary artery calcifications. CT ABDOMEN AND PELVIS: 1. No acute intra-abdominal or pelvic process identified. No definite occlusive or nonocclusive thrombus identified within the IVC or iliac vessels. However, please note that this study is not optimized for evaluation of the venous structures due to timing of the contrast bolus. Correlation with lower extremity Doppler ultrasound could be performed as clinically indicated. Electronically Signed   By: Jeannine Boga M.D.   On: 07/04/2013 16:35   Ct Abdomen Pelvis W Contrast  Result Date: 07/04/2013 CLINICAL DATA:  Abdominal pain EXAM: CT ABDOMEN AND PELVIS WITH CONTRAST TECHNIQUE: Multidetector CT imaging of the abdomen and pelvis was performed using the standard protocol following bolus administration of intravenous contrast. CONTRAST:  125mL OMNIPAQUE IOHEXOL 350 MG/ML SOLN COMPARISON:  Prior CT from 11/20/2010. FINDINGS: CT CHEST: No pathologically enlarged mediastinal, hilar, or axillary lymph nodes are identified. The aortic great vessels demonstrate a normal contrast enhanced appearance. Heart size is within normal  limits. Three-vessel coronary artery calcifications are present. No pericardial effusion. Pulmonary arterial tree is well opacified. No filling defects are seen to suggest acute pulmonary embolism. Re-formatted imaging confirms these findings. The lungs are clear without focal infiltrate, pulmonary edema, or pleural effusion. Mild dependent atelectasis is seen within the visualized lung bases. An 8 mm subpleural nodule is present within the posterior right lower lobe (series 7, image 50), indeterminate. No other pulmonary nodules or masses identified. CT ABDOMEN AND PELVIS: Subcentimeter hypodensities present within the left hepatic lobe are present, stable as compared to prior study, and most likely reflect benign hepatic cysts. Additional irregular hypodense lesion measuring 1.9 cm is present within the caudate lobe of the liver is also consistent with a benign cyst. The liver is otherwise unremarkable. Gallbladder is surgically absent. No biliary ductal dilatation. The spleen, adrenal glands, and pancreas demonstrate a normal contrast enhanced appearance. Bilateral renal cysts are present, the largest of which is seen extending from the upper pole of the left kidney and measures 6.3 x 5.3  cm. No nephrolithiasis, hydronephrosis, or focal enhancing renal mass. No evidence of bowel obstruction. No abnormal mucosal enhancement, wall thickening, or inflammatory fat stranding seen about the bowels. Surgical clips within the right lower quadrant are most consistent with prior appendectomy. Bladder is unremarkable.  Prostate is within normal limits. No free air or fluid identified. No pathologically enlarged intra-abdominal pelvic lymph nodes. Study is not optimized for evaluation of the venous structures, however, no definite thrombus seen within the IVC or bilateral iliac veins. Spinal fixation noted at the L5-S1 level. Grade 1 anterolisthesis of L4 on L5 this present. Multilevel degenerative changes present within the  lower thoracic and lumbar spine. No acute osseous abnormality. No focal lytic or blastic osseous lesions. IMPRESSION: CT CHEST: 1. No CT evidence of acute pulmonary embolism. No other acute abnormality identified within the thorax. 2. 8 mm subpleural right lower lobe nodule, indeterminate, but stable as compared to prior CT from 11/20/2010. 3. Coronary artery calcifications. CT ABDOMEN AND PELVIS: 1. No acute intra-abdominal or pelvic process identified. No definite occlusive or nonocclusive thrombus identified within the IVC or iliac vessels. However, please note that this study is not optimized for evaluation of the venous structures due to timing of the contrast bolus. Correlation with lower extremity Doppler ultrasound could be performed as clinically indicated. Electronically Signed   By: Jeannine Boga M.D.   On: 07/04/2013 16:35    Assessment & Plan:   There are no diagnoses linked to this encounter. I am having Robert [name] maintain his cetirizine, fish oil-omega-3 fatty acids, glucosamine-chondroitin, Vitamin B-12, triamcinolone cream, nortriptyline, allopurinol, colchicine, albuterol, clobetasol cream, PROAIR HFA, promethazine, warfarin, warfarin, furosemide, and oxyCODONE.  No orders of the defined types were placed in this encounter.    Follow-up: No Follow-up on file.  Walker Kehr, MD

## 2016-02-18 NOTE — Addendum Note (Signed)
Addended by: Cresenciano Lick on: 02/18/2016 04:43 PM   Modules accepted: Orders

## 2016-02-19 LAB — HEPATITIS C ANTIBODY: HCV Ab: NEGATIVE

## 2016-02-19 LAB — LDL CHOLESTEROL, DIRECT: Direct LDL: 162 mg/dL

## 2016-03-04 ENCOUNTER — Telehealth: Payer: Self-pay | Admitting: Internal Medicine

## 2016-03-04 NOTE — Telephone Encounter (Signed)
Pt request refill for oxyCODONE (OXY IR/ROXICODONE) 5 MG immediate release tablet. Please call pt once its ready.

## 2016-03-04 NOTE — Telephone Encounter (Signed)
OK to fill this prescription with additional refills x0 if time Thank you!  

## 2016-03-05 MED ORDER — OXYCODONE HCL 5 MG PO TABS: 5.0000 mg | ORAL_TABLET | Freq: Three times a day (TID) | ORAL | 0 refills | Status: DC | PRN

## 2016-03-05 NOTE — Telephone Encounter (Signed)
Notified pt wife rx ready for pick-up...[name]

## 2016-03-16 ENCOUNTER — Other Ambulatory Visit: Payer: Self-pay | Admitting: Internal Medicine

## 2016-03-17 ENCOUNTER — Other Ambulatory Visit: Payer: Self-pay | Admitting: General Practice

## 2016-03-17 MED ORDER — WARFARIN SODIUM 6 MG PO TABS: ORAL_TABLET | ORAL | 1 refills | Status: DC

## 2016-03-26 ENCOUNTER — Ambulatory Visit (INDEPENDENT_AMBULATORY_CARE_PROVIDER_SITE_OTHER): Payer: 59 | Admitting: General Practice

## 2016-03-26 DIAGNOSIS — Z5181 Encounter for therapeutic drug level monitoring: Secondary | ICD-10-CM

## 2016-03-26 DIAGNOSIS — I2699 Other pulmonary embolism without acute cor pulmonale: Secondary | ICD-10-CM | POA: Diagnosis not present

## 2016-03-26 DIAGNOSIS — I80209 Phlebitis and thrombophlebitis of unspecified deep vessels of unspecified lower extremity: Secondary | ICD-10-CM

## 2016-03-26 DIAGNOSIS — I82409 Acute embolism and thrombosis of unspecified deep veins of unspecified lower extremity: Secondary | ICD-10-CM | POA: Diagnosis not present

## 2016-03-26 LAB — POCT INR: INR: 2.7

## 2016-04-03 ENCOUNTER — Other Ambulatory Visit: Payer: Self-pay

## 2016-04-03 NOTE — Telephone Encounter (Signed)
Please advise patient wife called and stated that patient is needing his pain medication refilled. He will be running out by this weekend.

## 2016-04-04 MED ORDER — OXYCODONE HCL 5 MG PO TABS: 5.0000 mg | ORAL_TABLET | Freq: Three times a day (TID) | ORAL | 0 refills | Status: DC | PRN

## 2016-04-04 NOTE — Addendum Note (Signed)
Addended by: Cresenciano Lick on: 04/04/2016 12:17 PM   Modules accepted: Orders

## 2016-04-04 NOTE — Telephone Encounter (Signed)
Ok to Rf in PCP's absence? Thanks! 

## 2016-04-04 NOTE — Telephone Encounter (Signed)
Rx signed by Terri Piedra, FNP. Rx is upfront for p/u. Pt informed

## 2016-04-05 ENCOUNTER — Other Ambulatory Visit: Payer: Self-pay | Admitting: Internal Medicine

## 2016-05-08 ENCOUNTER — Telehealth: Payer: Self-pay | Admitting: Emergency Medicine

## 2016-05-08 NOTE — Telephone Encounter (Signed)
OK to fill this prescription with additional refills x0 OV q 3 mo Thank you!  

## 2016-05-08 NOTE — Telephone Encounter (Signed)
Pts wife called and wants to know if the patient can get a prescription refill on his oxyCODONE (OXY IR/ROXICODONE) 5 MG immediate release tablet. Please advise thanks.

## 2016-05-09 ENCOUNTER — Ambulatory Visit (INDEPENDENT_AMBULATORY_CARE_PROVIDER_SITE_OTHER): Payer: 59 | Admitting: General Practice

## 2016-05-09 DIAGNOSIS — I2699 Other pulmonary embolism without acute cor pulmonale: Secondary | ICD-10-CM | POA: Diagnosis not present

## 2016-05-09 DIAGNOSIS — Z5181 Encounter for therapeutic drug level monitoring: Secondary | ICD-10-CM

## 2016-05-09 DIAGNOSIS — I80209 Phlebitis and thrombophlebitis of unspecified deep vessels of unspecified lower extremity: Secondary | ICD-10-CM

## 2016-05-09 LAB — POCT INR: INR: 1.9

## 2016-05-09 MED ORDER — OXYCODONE HCL 5 MG PO TABS: 5.0000 mg | ORAL_TABLET | Freq: Three times a day (TID) | ORAL | 0 refills | Status: DC | PRN

## 2016-05-09 NOTE — Progress Notes (Signed)
I have reviewed and agree with the plan. 

## 2016-05-09 NOTE — Addendum Note (Signed)
Addended by: Cresenciano Lick on: 05/09/2016 10:55 AM   Modules accepted: Orders

## 2016-05-09 NOTE — Patient Instructions (Signed)
Pre visit review using our clinic review tool, if applicable. No additional management support is needed unless otherwise documented below in the visit note. 

## 2016-05-09 NOTE — Telephone Encounter (Signed)
Rx printed/upfront for p/u. Pt's wife informed. She states pt has coumadin clinic appt today. Will give to pt then. OV with PCP is scheduled 06/25/2016.

## 2016-05-19 ENCOUNTER — Telehealth: Payer: Self-pay

## 2016-05-19 ENCOUNTER — Other Ambulatory Visit: Payer: Self-pay | Admitting: General Practice

## 2016-05-19 MED ORDER — WARFARIN SODIUM 6 MG PO TABS: ORAL_TABLET | ORAL | 1 refills | Status: DC

## 2016-05-19 NOTE — Telephone Encounter (Signed)
rf rq for warfarin. Please advise

## 2016-06-11 ENCOUNTER — Telehealth: Payer: Self-pay | Admitting: Emergency Medicine

## 2016-06-11 NOTE — Telephone Encounter (Signed)
OK to fill this prescription with additional refills x0 OV every 3 mo Thank you!

## 2016-06-11 NOTE — Telephone Encounter (Signed)
Pt called and wants to know if he can get a prescription refill on his pain medicine. Please advise thanks.

## 2016-06-12 MED ORDER — OXYCODONE HCL 5 MG PO TABS: 5.0000 mg | ORAL_TABLET | Freq: Three times a day (TID) | ORAL | 0 refills | Status: DC | PRN

## 2016-06-12 NOTE — Addendum Note (Signed)
Addended by: Earnstine Regal on: 06/12/2016 09:09 AM   Modules accepted: Orders

## 2016-06-12 NOTE — Telephone Encounter (Signed)
Called pt no answer LMOM rx ready for pick-up...Chryl Heck

## 2016-06-13 ENCOUNTER — Ambulatory Visit (INDEPENDENT_AMBULATORY_CARE_PROVIDER_SITE_OTHER): Payer: 59 | Admitting: General Practice

## 2016-06-13 DIAGNOSIS — Z5181 Encounter for therapeutic drug level monitoring: Secondary | ICD-10-CM

## 2016-06-13 LAB — POCT INR: INR: 2.7

## 2016-06-13 NOTE — Progress Notes (Signed)
I have reviewed and agree with the plan. 

## 2016-06-13 NOTE — Patient Instructions (Signed)
Pre visit review using our clinic review tool, if applicable. No additional management support is needed unless otherwise documented below in the visit note. 

## 2016-06-19 ENCOUNTER — Other Ambulatory Visit: Payer: Self-pay | Admitting: Internal Medicine

## 2016-06-25 ENCOUNTER — Ambulatory Visit: Payer: 59 | Admitting: Internal Medicine

## 2016-07-18 ENCOUNTER — Telehealth: Payer: Self-pay | Admitting: Internal Medicine

## 2016-07-18 NOTE — Telephone Encounter (Signed)
OK to fill this/these prescription(s) with additional refills x0 Needs to have an OV every 3 months - sch OV Thank you!

## 2016-07-18 NOTE — Telephone Encounter (Signed)
Pt's wife called and needs refill of pain medication, please call when you can, MS

## 2016-07-21 MED ORDER — OXYCODONE HCL 5 MG PO TABS: 5.0000 mg | ORAL_TABLET | Freq: Three times a day (TID) | ORAL | 0 refills | Status: DC | PRN

## 2016-07-21 NOTE — Telephone Encounter (Signed)
Pt call back to check with this request, please help, she has an appt with Plot 08/2016 (due to insurance issue that's why he has not come in). Please call pt once its ready to pick up

## 2016-07-21 NOTE — Telephone Encounter (Signed)
Printed script, MD signed notified pt wife rx ready for pick-up...Robert [name]

## 2016-07-25 ENCOUNTER — Ambulatory Visit (INDEPENDENT_AMBULATORY_CARE_PROVIDER_SITE_OTHER): Payer: 59 | Admitting: General Practice

## 2016-07-25 DIAGNOSIS — Z5181 Encounter for therapeutic drug level monitoring: Secondary | ICD-10-CM | POA: Diagnosis not present

## 2016-07-25 LAB — POCT INR: INR: 3.2

## 2016-07-25 NOTE — Patient Instructions (Signed)
Pre visit review using our clinic review tool, if applicable. No additional management support is needed unless otherwise documented below in the visit note. 

## 2016-07-25 NOTE — Progress Notes (Signed)
I have reviewed and agree with the plan. 

## 2016-08-18 ENCOUNTER — Telehealth: Payer: Self-pay | Admitting: Internal Medicine

## 2016-08-18 MED ORDER — OXYCODONE HCL 5 MG PO TABS: 5.0000 mg | ORAL_TABLET | Freq: Three times a day (TID) | ORAL | 0 refills | Status: DC | PRN

## 2016-08-18 NOTE — Telephone Encounter (Signed)
OK to fill this/these prescription(s) with additional refills x0 Needs to have an OV every 3 months Thank you!  

## 2016-08-18 NOTE — Telephone Encounter (Signed)
Routing to dr plotnkov, please advise, thanks 

## 2016-08-18 NOTE — Telephone Encounter (Signed)
rx printed, waiting for dr plotnikov to sign---and call patient to pick up and also to schedule office visit every 3 months

## 2016-08-18 NOTE — Telephone Encounter (Signed)
Patients wife called in and is requesting a refill on patients pain medication, oxycondone 5mg . She states it really needs to be refill before the month is over, due to his insurance changing. She asked to call her back when ready to pick up, she out and about today. Thank you.

## 2016-08-19 NOTE — Telephone Encounter (Signed)
rx signed, patient's wife advised, also patient has scheduled 3/13 appt with dr plotnikov, reminded to keep appt for additional refills

## 2016-08-26 ENCOUNTER — Other Ambulatory Visit: Payer: Self-pay | Admitting: Internal Medicine

## 2016-09-02 ENCOUNTER — Ambulatory Visit (INDEPENDENT_AMBULATORY_CARE_PROVIDER_SITE_OTHER): Payer: MEDICARE | Admitting: General Practice

## 2016-09-02 ENCOUNTER — Encounter: Payer: Self-pay | Admitting: Internal Medicine

## 2016-09-02 ENCOUNTER — Ambulatory Visit (INDEPENDENT_AMBULATORY_CARE_PROVIDER_SITE_OTHER): Payer: MEDICARE | Admitting: Internal Medicine

## 2016-09-02 VITALS — BP 116/78 | HR 105 | Temp 97.9°F | Resp 16 | Ht 71.0 in | Wt 288.8 lb

## 2016-09-02 DIAGNOSIS — G8929 Other chronic pain: Secondary | ICD-10-CM

## 2016-09-02 DIAGNOSIS — IMO0001 Reserved for inherently not codable concepts without codable children: Secondary | ICD-10-CM

## 2016-09-02 DIAGNOSIS — I872 Venous insufficiency (chronic) (peripheral): Secondary | ICD-10-CM | POA: Diagnosis not present

## 2016-09-02 DIAGNOSIS — E538 Deficiency of other specified B group vitamins: Secondary | ICD-10-CM

## 2016-09-02 DIAGNOSIS — M545 Low back pain: Secondary | ICD-10-CM

## 2016-09-02 DIAGNOSIS — Z5181 Encounter for therapeutic drug level monitoring: Secondary | ICD-10-CM

## 2016-09-02 DIAGNOSIS — I82501 Chronic embolism and thrombosis of unspecified deep veins of right lower extremity: Secondary | ICD-10-CM | POA: Diagnosis not present

## 2016-09-02 DIAGNOSIS — Z0000 Encounter for general adult medical examination without abnormal findings: Secondary | ICD-10-CM

## 2016-09-02 LAB — POCT INR: INR: 2.7

## 2016-09-02 NOTE — Assessment & Plan Note (Signed)
Compr sock R

## 2016-09-02 NOTE — Patient Instructions (Signed)
 Health Maintenance, Male A healthy lifestyle and preventive care is important for your health and wellness. Ask your health care provider about what schedule of regular examinations is right for you. What should I know about weight and diet?  Eat a Healthy Diet  Eat plenty of vegetables, fruits, whole grains, low-fat dairy products, and lean protein.  Do not eat a lot of foods high in solid fats, added sugars, or salt. Maintain a Healthy Weight  Regular exercise can help you achieve or maintain a healthy weight. You should:  Do at least 150 minutes of exercise each week. The exercise should increase your heart rate and make you sweat (moderate-intensity exercise).  Do strength-training exercises at least twice a week. Watch Your Levels of Cholesterol and Blood Lipids  Have your blood tested for lipids and cholesterol every 5 years starting at 65 years of age. If you are at high risk for heart disease, you should start having your blood tested when you are 65 years old. You may need to have your cholesterol levels checked more often if:  Your lipid or cholesterol levels are high.  You are older than 65 years of age.  You are at high risk for heart disease. What should I know about cancer screening? Many types of cancers can be detected early and may often be prevented. Lung Cancer  You should be screened every year for lung cancer if:  You are a current smoker who has smoked for at least 30 years.  You are a former smoker who has quit within the past 15 years.  Talk to your health care provider about your screening options, when you should start screening, and how often you should be screened. Colorectal Cancer  Routine colorectal cancer screening usually begins at 65 years of age and should be repeated every 5-10 years until you are 65 years old. You may need to be screened more often if early forms of precancerous polyps or small growths are found. Your health care provider  may recommend screening at an earlier age if you have risk factors for colon cancer.  Your health care provider may recommend using home test kits to check for hidden blood in the stool.  A small camera at the end of a tube can be used to examine your colon (sigmoidoscopy or colonoscopy). This checks for the earliest forms of colorectal cancer. Prostate and Testicular Cancer  Depending on your age and overall health, your health care provider may do certain tests to screen for prostate and testicular cancer.  Talk to your health care provider about any symptoms or concerns you have about testicular or prostate cancer. Skin Cancer  Check your skin from head to toe regularly.  Tell your health care provider about any new moles or changes in moles, especially if:  There is a change in a mole's size, shape, or color.  You have a mole that is larger than a pencil eraser.  Always use sunscreen. Apply sunscreen liberally and repeat throughout the day.  Protect yourself by wearing long sleeves, pants, a wide-brimmed hat, and sunglasses when outside. What should I know about heart disease, diabetes, and high blood pressure?  If you are 18-39 years of age, have your blood pressure checked every 3-5 years. If you are 40 years of age or older, have your blood pressure checked every year. You should have your blood pressure measured twice-once when you are at a hospital or clinic, and once when you are not at   a hospital or clinic. Record the average of the two measurements. To check your blood pressure when you are not at a hospital or clinic, you can use:  An automated blood pressure machine at a pharmacy.  A home blood pressure monitor.  Talk to your health care provider about your target blood pressure.  If you are between 45-79 years old, ask your health care provider if you should take aspirin to prevent heart disease.  Have regular diabetes screenings by checking your fasting blood sugar  level.  If you are at a normal weight and have a low risk for diabetes, have this test once every three years after the age of 45.  If you are overweight and have a high risk for diabetes, consider being tested at a younger age or more often.  A one-time screening for abdominal aortic aneurysm (AAA) by ultrasound is recommended for men aged 65-75 years who are current or former smokers. What should I know about preventing infection? Hepatitis B  If you have a higher risk for hepatitis B, you should be screened for this virus. Talk with your health care provider to find out if you are at risk for hepatitis B infection. Hepatitis C  Blood testing is recommended for:  Everyone born from 1945 through 1965.  Anyone with known risk factors for hepatitis C. Sexually Transmitted Diseases (STDs)  You should be screened each year for STDs including gonorrhea and chlamydia if:  You are sexually active and are younger than 65 years of age.  You are older than 65 years of age and your health care provider tells you that you are at risk for this type of infection.  Your sexual activity has changed since you were last screened and you are at an increased risk for chlamydia or gonorrhea. Ask your health care provider if you are at risk.  Talk with your health care provider about whether you are at high risk of being infected with HIV. Your health care provider may recommend a prescription medicine to help prevent HIV infection. What else can I do?  Schedule regular health, dental, and eye exams.  Stay current with your vaccines (immunizations).  Do not use any tobacco products, such as cigarettes, chewing tobacco, and e-cigarettes. If you need help quitting, ask your health care provider.  Limit alcohol intake to no more than 2 drinks per day. One drink equals 12 ounces of beer, 5 ounces of wine, or 1 ounces of hard liquor.  Do not use street drugs.  Do not share needles.  Ask your health  care provider for help if you need support or information about quitting drugs.  Tell your health care provider if you often feel depressed.  Tell your health care provider if you have ever been abused or do not feel safe at home. This information is not intended to replace advice given to you by your health care provider. Make sure you discuss any questions you have with your health care provider. Document Released: 12/06/2007 Document Revised: 02/06/2016 Document Reviewed: 03/13/2015 [employer] Interactive Patient Education  2017 [employer]  

## 2016-09-02 NOTE — Progress Notes (Signed)
Subjective:  Patient ID: Robert [name], male    DOB: [birth year]  Age: 66 y.o. MRN: [id number]  CC: Follow-up (LBP, left knee pain)   HPI  Welcome to [facility] physical Robert [name] presents for chronic pain, gout, anticoagulation f/u Robert [name] backwards on his butt against his car the other day while carrying groceries   Outpatient Medications Prior to Visit  Medication Sig Dispense Refill  . albuterol (PROVENTIL) (2.5 MG/3ML) 0.083% nebulizer solution Take 3 mLs (2.5 mg total) by nebulization every 6 (six) hours as needed for wheezing or shortness of breath. 150 mL 3  . allopurinol (ZYLOPRIM) 100 MG tablet Take 1 tablet (100 mg total) by mouth daily. 90 tablet 3  . cetirizine (ZYRTEC) 10 MG tablet Take 10 mg by mouth daily.     . colchicine 0.6 MG tablet Take 1 tablet (0.6 mg total) by mouth 2 (two) times daily as needed. 60 tablet 3  . Cyanocobalamin (VITAMIN B-12) 5000 MCG SUBL Place 5,000 tablets under the tongue every morning.    . fish oil-omega-3 fatty acids 1000 MG capsule Take 2 g by mouth every morning.    . furosemide (LASIX) 20 MG tablet Take 1 tablet (20 mg total) by mouth daily. 90 tablet 3  . glucosamine-chondroitin 500-400 MG tablet Take 1 tablet by mouth every morning.    . nortriptyline (PAMELOR) 10 MG capsule TAKE 1 CAPSULE BY MOUTH EVERY EVENING 90 capsule 3  . oxyCODONE (OXY IR/ROXICODONE) 5 MG immediate release tablet Take 1 tablet (5 mg total) by mouth every 8 (eight) hours as needed. 90 tablet 0  . PROAIR HFA 108 (90 Base) MCG/ACT inhaler INHALE 2 PUFFS EVERY 6 HOURS AS NEEDED FOR WHEEZING/SHORTNESS OF BREATH 25.5 Inhaler 2  . promethazine (PHENERGAN) 25 MG suppository Place 1 suppository (25 mg total) rectally every 6 (six) hours as needed for nausea or vomiting. 12 each 1  . triamcinolone cream (KENALOG) 0.5 % Apply 1 application topically 3 (three) times daily. 120 g 1  . warfarin (COUMADIN) 3 MG tablet AS DIRECTED 45 tablet 1  . warfarin (COUMADIN) 6 MG  tablet USE AS DIRECTED 90 tablet 1   No facility-administered medications prior to visit.     ROS Review of Systems  Constitutional: Negative for appetite change, fatigue and unexpected weight change.  HENT: Negative for congestion, nosebleeds, sneezing, sore throat and trouble swallowing.   Eyes: Negative for itching and visual disturbance.  Respiratory: Negative for cough.   Cardiovascular: Positive for leg swelling. Negative for chest pain and palpitations.  Gastrointestinal: Negative for abdominal distention, blood in stool, diarrhea and nausea.  Genitourinary: Negative for frequency and hematuria.  Musculoskeletal: Positive for arthralgias, back pain and gait problem. Negative for joint swelling and neck pain.  Skin: Negative for rash.  Neurological: Negative for dizziness, tremors, speech difficulty and weakness.  Hematological: Does not bruise/bleed easily.  Psychiatric/Behavioral: Negative for agitation, dysphoric mood and sleep disturbance. The patient is nervous/anxious.     Objective:  BP 116/78   Pulse (!) 105   Temp 97.9 F (36.6 C) (Oral)   Resp 16   Ht 5\' 11"  (1.803 m)   Wt 288 lb 12 oz (131 kg)   SpO2 94%   BMI 40.27 kg/m   BP Readings from Last 3 Encounters:  09/02/16 116/78  02/18/16 118/60  05/16/15 130/82    Wt Readings from Last 3 Encounters:  09/02/16 288 lb 12 oz (131 kg)  02/18/16 290 lb (131.5 kg)  05/16/15 [name])  304 lb 4 oz (138 kg)    Physical Exam  Constitutional: He is oriented to person, place, and time. He appears well-developed. No distress.  NAD  HENT:  Mouth/Throat: Oropharynx is clear and moist.  Eyes: Conjunctivae are normal. Pupils are equal, round, and reactive to light.  Neck: Normal range of motion. No JVD present. No thyromegaly present.  Cardiovascular: Normal rate, regular rhythm, normal heart sounds and intact distal pulses.  Exam reveals no gallop and no friction rub.   No murmur heard. Pulmonary/Chest: Effort normal and  breath sounds normal. No respiratory distress. He has no wheezes. He has no rales. He exhibits no tenderness.  Abdominal: Soft. Bowel sounds are normal. He exhibits no distension and no mass. There is tenderness. There is no rebound and no guarding.  Musculoskeletal: Normal range of motion. He exhibits edema and tenderness.  Lymphadenopathy:    He has no cervical adenopathy.  Neurological: He is alert and oriented to person, place, and time. He has normal reflexes. No cranial nerve deficit. He exhibits normal muscle tone. He displays a negative Romberg sign. Coordination and gait normal.  Skin: Skin is warm and dry. No rash noted.  Psychiatric: He has a normal mood and affect. His behavior is normal. Judgment and thought content normal.  Bruises on buttocks R>L  Lab Results  Component Value Date   WBC 10.0 02/18/2016   HGB 16.2 02/18/2016   HCT 46.2 02/18/2016   PLT 312.0 02/18/2016   GLUCOSE 97 02/18/2016   CHOL 232 (H) 02/18/2016   TRIG 214.0 (H) 02/18/2016   HDL 29.40 (L) 02/18/2016   LDLDIRECT 162.0 02/18/2016   ALT 24 02/18/2016   AST 23 02/18/2016   NA 137 02/18/2016   K 5.1 02/18/2016   CL 98 02/18/2016   CREATININE 1.04 02/18/2016   BUN 13 02/18/2016   CO2 30 02/18/2016   TSH 3.18 02/18/2016   PSA 0.11 02/18/2016   INR 3.2 07/25/2016   HGBA1C 5.3 04/19/2007    Ct Angio Chest Pe W/cm &/or Wo Cm  Result Date: 07/04/2013 CLINICAL DATA:  Abdominal pain EXAM: CT ABDOMEN AND PELVIS WITH CONTRAST TECHNIQUE: Multidetector CT imaging of the abdomen and pelvis was performed using the standard protocol following bolus administration of intravenous contrast. CONTRAST:  169mL OMNIPAQUE IOHEXOL 350 MG/ML SOLN COMPARISON:  Prior CT from 11/20/2010. FINDINGS: CT CHEST: No pathologically enlarged mediastinal, hilar, or axillary lymph nodes are identified. The aortic great vessels demonstrate a normal contrast enhanced appearance. Heart size is within normal limits. Three-vessel coronary  artery calcifications are present. No pericardial effusion. Pulmonary arterial tree is well opacified. No filling defects are seen to suggest acute pulmonary embolism. Re-formatted imaging confirms these findings. The lungs are clear without focal infiltrate, pulmonary edema, or pleural effusion. Mild dependent atelectasis is seen within the visualized lung bases. An 8 mm subpleural nodule is present within the posterior right lower lobe (series 7, image 50), indeterminate. No other pulmonary nodules or masses identified. CT ABDOMEN AND PELVIS: Subcentimeter hypodensities present within the left hepatic lobe are present, stable as compared to prior study, and most likely reflect benign hepatic cysts. Additional irregular hypodense lesion measuring 1.9 cm is present within the caudate lobe of the liver is also consistent with a benign cyst. The liver is otherwise unremarkable. Gallbladder is surgically absent. No biliary ductal dilatation. The spleen, adrenal glands, and pancreas demonstrate a normal contrast enhanced appearance. Bilateral renal cysts are present, the largest of which is seen extending from the upper  pole of the left kidney and measures 6.3 x 5.3 cm. No nephrolithiasis, hydronephrosis, or focal enhancing renal mass. No evidence of bowel obstruction. No abnormal mucosal enhancement, wall thickening, or inflammatory fat stranding seen about the bowels. Surgical clips within the right lower quadrant are most consistent with prior appendectomy. Bladder is unremarkable.  Prostate is within normal limits. No free air or fluid identified. No pathologically enlarged intra-abdominal pelvic lymph nodes. Study is not optimized for evaluation of the venous structures, however, no definite thrombus seen within the IVC or bilateral iliac veins. Spinal fixation noted at the L5-S1 level. Grade 1 anterolisthesis of L4 on L5 this present. Multilevel degenerative changes present within the lower thoracic and lumbar  spine. No acute osseous abnormality. No focal lytic or blastic osseous lesions. IMPRESSION: CT CHEST: 1. No CT evidence of acute pulmonary embolism. No other acute abnormality identified within the thorax. 2. 8 mm subpleural right lower lobe nodule, indeterminate, but stable as compared to prior CT from 11/20/2010. 3. Coronary artery calcifications. CT ABDOMEN AND PELVIS: 1. No acute intra-abdominal or pelvic process identified. No definite occlusive or nonocclusive thrombus identified within the IVC or iliac vessels. However, please note that this study is not optimized for evaluation of the venous structures due to timing of the contrast bolus. Correlation with lower extremity Doppler ultrasound could be performed as clinically indicated. Electronically Signed   By: Jeannine Boga M.D.   On: 07/04/2013 16:35   Ct Abdomen Pelvis W Contrast  Result Date: 07/04/2013 CLINICAL DATA:  Abdominal pain EXAM: CT ABDOMEN AND PELVIS WITH CONTRAST TECHNIQUE: Multidetector CT imaging of the abdomen and pelvis was performed using the standard protocol following bolus administration of intravenous contrast. CONTRAST:  119mL OMNIPAQUE IOHEXOL 350 MG/ML SOLN COMPARISON:  Prior CT from 11/20/2010. FINDINGS: CT CHEST: No pathologically enlarged mediastinal, hilar, or axillary lymph nodes are identified. The aortic great vessels demonstrate a normal contrast enhanced appearance. Heart size is within normal limits. Three-vessel coronary artery calcifications are present. No pericardial effusion. Pulmonary arterial tree is well opacified. No filling defects are seen to suggest acute pulmonary embolism. Re-formatted imaging confirms these findings. The lungs are clear without focal infiltrate, pulmonary edema, or pleural effusion. Mild dependent atelectasis is seen within the visualized lung bases. An 8 mm subpleural nodule is present within the posterior right lower lobe (series 7, image 50), indeterminate. No other pulmonary  nodules or masses identified. CT ABDOMEN AND PELVIS: Subcentimeter hypodensities present within the left hepatic lobe are present, stable as compared to prior study, and most likely reflect benign hepatic cysts. Additional irregular hypodense lesion measuring 1.9 cm is present within the caudate lobe of the liver is also consistent with a benign cyst. The liver is otherwise unremarkable. Gallbladder is surgically absent. No biliary ductal dilatation. The spleen, adrenal glands, and pancreas demonstrate a normal contrast enhanced appearance. Bilateral renal cysts are present, the largest of which is seen extending from the upper pole of the left kidney and measures 6.3 x 5.3 cm. No nephrolithiasis, hydronephrosis, or focal enhancing renal mass. No evidence of bowel obstruction. No abnormal mucosal enhancement, wall thickening, or inflammatory fat stranding seen about the bowels. Surgical clips within the right lower quadrant are most consistent with prior appendectomy. Bladder is unremarkable.  Prostate is within normal limits. No free air or fluid identified. No pathologically enlarged intra-abdominal pelvic lymph nodes. Study is not optimized for evaluation of the venous structures, however, no definite thrombus seen within the IVC or bilateral iliac veins. Spinal  fixation noted at the L5-S1 level. Grade 1 anterolisthesis of L4 on L5 this present. Multilevel degenerative changes present within the lower thoracic and lumbar spine. No acute osseous abnormality. No focal lytic or blastic osseous lesions. IMPRESSION: CT CHEST: 1. No CT evidence of acute pulmonary embolism. No other acute abnormality identified within the thorax. 2. 8 mm subpleural right lower lobe nodule, indeterminate, but stable as compared to prior CT from 11/20/2010. 3. Coronary artery calcifications. CT ABDOMEN AND PELVIS: 1. No acute intra-abdominal or pelvic process identified. No definite occlusive or nonocclusive thrombus identified within the  IVC or iliac vessels. However, please note that this study is not optimized for evaluation of the venous structures due to timing of the contrast bolus. Correlation with lower extremity Doppler ultrasound could be performed as clinically indicated. Electronically Signed   By: Jeannine Boga M.D.   On: 07/04/2013 16:35    Assessment & Plan:   There are no diagnoses linked to this encounter. I am having Mr. Krauser maintain his cetirizine, fish oil-omega-3 fatty acids, glucosamine-chondroitin, Vitamin B-12, allopurinol, colchicine, albuterol, promethazine, triamcinolone cream, nortriptyline, furosemide, warfarin, PROAIR HFA, warfarin, and oxyCODONE.  No orders of the defined types were placed in this encounter.    Follow-up: No Follow-up on file.  Walker Kehr, MD

## 2016-09-02 NOTE — Assessment & Plan Note (Signed)
Wt Readings from Last 3 Encounters:  09/02/16 288 lb 12 oz (131 kg)  02/18/16 290 lb (131.5 kg)  05/16/15 (!) 304 lb 4 oz (138 kg)

## 2016-09-02 NOTE — Patient Instructions (Signed)
Pre visit review using our clinic review tool, if applicable. No additional management support is needed unless otherwise documented below in the visit note. 

## 2016-09-02 NOTE — Assessment & Plan Note (Signed)
Welcome to [facility]  Here for medicare wellness/physical  Diet: heart healthy  Physical activity: not sedentary  Depression/mood screen: negative  Hearing: intact to whispered voice  Visual acuity: grossly normal, performs annual eye exam  ADLs: capable  Fall risk: moderate Home safety: good  Cognitive evaluation: intact to orientation, naming, recall and repetition  EOL planning: adv directives, full code/ I agree  I have personally reviewed and have noted  1. The patient's medical, surgical and social history  2. Their use of alcohol, tobacco or illicit drugs  3. Their current medications and supplements  4. The patient's functional ability including ADL's, fall risks, home safety risks and hearing or visual impairment.  5. Diet and physical activities - cont w/wt loss 6. Evidence for depression or mood disorders 7. The roster of all physicians providing medical care to patient - is listed in the Snapshot section of the chart and reviewed today.    Today patient counseled on age appropriate routine health concerns for screening and prevention, each reviewed and up to date or declined. Immunizations reviewed and up to date or declined. Labs ordered and reviewed. Risk factors for depression reviewed and negative. Hearing function and visual acuity are intact. ADLs screened and addressed as needed. Functional ability and level of safety reviewed and appropriate. Education, counseling and referrals performed based on assessed risks today. Patient provided with a copy of personalized plan for preventive services.   Cologard info. Pt declined Colonoscopy

## 2016-09-02 NOTE — Progress Notes (Signed)
I have reviewed and agree with the plan. 

## 2016-09-02 NOTE — Assessment & Plan Note (Signed)
On B12 

## 2016-09-02 NOTE — Progress Notes (Signed)
Pre-visit discussion using our clinic review tool. No additional management support is needed unless otherwise documented below in the visit note.  

## 2016-09-02 NOTE — Assessment & Plan Note (Addendum)
Coumadin Compr sock R 

## 2016-09-20 ENCOUNTER — Other Ambulatory Visit: Payer: Self-pay | Admitting: Internal Medicine

## 2016-09-22 ENCOUNTER — Other Ambulatory Visit: Payer: Self-pay | Admitting: General Practice

## 2016-09-22 MED ORDER — WARFARIN SODIUM 3 MG PO TABS: ORAL_TABLET | ORAL | 1 refills | Status: DC

## 2016-10-09 ENCOUNTER — Other Ambulatory Visit: Payer: Self-pay | Admitting: Internal Medicine

## 2016-10-09 NOTE — Telephone Encounter (Signed)
Pt would like refill of oxyCODONE (OXY IR/ROXICODONE) 5 MG immediate release tablet

## 2016-10-09 NOTE — Telephone Encounter (Signed)
Okay to refill?   If okay, RX ready to print

## 2016-10-13 NOTE — Telephone Encounter (Signed)
Patient is calling again about this refill. Please advise.

## 2016-10-13 NOTE — Telephone Encounter (Signed)
Routing to dr plotnikov, please advise, thanks 

## 2016-10-14 MED ORDER — OXYCODONE HCL 5 MG PO TABS: 5.0000 mg | ORAL_TABLET | Freq: Three times a day (TID) | ORAL | 0 refills | Status: DC | PRN

## 2016-10-14 NOTE — Telephone Encounter (Signed)
OV q 3 mo 

## 2016-10-14 NOTE — Telephone Encounter (Signed)
Pt notified RX avaliable to be picked up

## 2016-10-17 ENCOUNTER — Ambulatory Visit (INDEPENDENT_AMBULATORY_CARE_PROVIDER_SITE_OTHER): Payer: MEDICARE | Admitting: General Practice

## 2016-10-17 DIAGNOSIS — Z5181 Encounter for therapeutic drug level monitoring: Secondary | ICD-10-CM

## 2016-10-17 LAB — POCT INR: INR: 3.4

## 2016-10-17 NOTE — Progress Notes (Signed)
I have reviewed and agree with the plan. 

## 2016-10-17 NOTE — Patient Instructions (Signed)
Pre visit review using our clinic review tool, if applicable. No additional management support is needed unless otherwise documented below in the visit note. 

## 2016-11-20 ENCOUNTER — Other Ambulatory Visit: Payer: Self-pay | Admitting: Internal Medicine

## 2016-11-20 NOTE — Telephone Encounter (Signed)
Okay to fill? 

## 2016-11-20 NOTE — Telephone Encounter (Signed)
Pt's wife called requesting a refill on his oxyCODONE (OXY IR/ROXICODONE) 5 MG. Call wife when this is ready for pick up [phone])

## 2016-11-23 MED ORDER — OXYCODONE HCL 5 MG PO TABS: 5.0000 mg | ORAL_TABLET | Freq: Three times a day (TID) | ORAL | 0 refills | Status: DC | PRN

## 2016-11-24 NOTE — Telephone Encounter (Signed)
Notified pt wife rx ready for pick-up../lmb 

## 2016-11-28 ENCOUNTER — Other Ambulatory Visit: Payer: Self-pay | Admitting: General Practice

## 2016-11-28 ENCOUNTER — Ambulatory Visit (INDEPENDENT_AMBULATORY_CARE_PROVIDER_SITE_OTHER): Payer: MEDICARE | Admitting: General Practice

## 2016-11-28 DIAGNOSIS — Z5181 Encounter for therapeutic drug level monitoring: Secondary | ICD-10-CM | POA: Diagnosis not present

## 2016-11-28 LAB — POCT INR: INR: 2.9

## 2016-11-28 MED ORDER — WARFARIN SODIUM 6 MG PO TABS: ORAL_TABLET | ORAL | 1 refills | Status: DC

## 2016-11-28 NOTE — Progress Notes (Signed)
I have reviewed and agree with the plan. 

## 2016-11-28 NOTE — Patient Instructions (Signed)
Pre visit review using our clinic review tool, if applicable. No additional management support is needed unless otherwise documented below in the visit note. 

## 2016-12-19 ENCOUNTER — Ambulatory Visit: Payer: 59 | Admitting: Internal Medicine

## 2017-01-05 ENCOUNTER — Other Ambulatory Visit: Payer: Self-pay | Admitting: Internal Medicine

## 2017-01-05 NOTE — Telephone Encounter (Signed)
Pt wife called in and said that pt needs refill on pain meds

## 2017-01-05 NOTE — Telephone Encounter (Signed)
Check [address] registry last filled Oxycodone 11/25/2016 @ [facility]. Pls advise on refill...Robert [name]

## 2017-01-06 NOTE — Telephone Encounter (Signed)
Pt wife called back requesting refill on his Oxycodone...Robert [name]

## 2017-01-07 NOTE — Telephone Encounter (Signed)
OK to fill this/these prescription(s) with additional refills x0 Needs to have an OV every 3 months Thank you!  

## 2017-01-08 MED ORDER — OXYCODONE HCL 5 MG PO TABS: 5.0000 mg | ORAL_TABLET | Freq: Three times a day (TID) | ORAL | 0 refills | Status: DC | PRN

## 2017-01-08 NOTE — Telephone Encounter (Signed)
Printed script, MD signed, notified pt rx ready for pick-up.../lmb 

## 2017-01-09 ENCOUNTER — Ambulatory Visit (INDEPENDENT_AMBULATORY_CARE_PROVIDER_SITE_OTHER): Payer: MEDICARE | Admitting: General Practice

## 2017-01-09 DIAGNOSIS — I82409 Acute embolism and thrombosis of unspecified deep veins of unspecified lower extremity: Secondary | ICD-10-CM | POA: Diagnosis not present

## 2017-01-09 DIAGNOSIS — Z5181 Encounter for therapeutic drug level monitoring: Secondary | ICD-10-CM | POA: Diagnosis not present

## 2017-01-09 DIAGNOSIS — I2699 Other pulmonary embolism without acute cor pulmonale: Secondary | ICD-10-CM

## 2017-01-09 LAB — POCT INR: INR: 2.9

## 2017-01-09 NOTE — Progress Notes (Signed)
I have reviewed and agree with the plan. 

## 2017-01-09 NOTE — Patient Instructions (Signed)
Pre visit review using our clinic review tool, if applicable. No additional management support is needed unless otherwise documented below in the visit note. 

## 2017-02-20 ENCOUNTER — Ambulatory Visit (INDEPENDENT_AMBULATORY_CARE_PROVIDER_SITE_OTHER): Payer: MEDICARE | Admitting: General Practice

## 2017-02-20 DIAGNOSIS — Z5181 Encounter for therapeutic drug level monitoring: Secondary | ICD-10-CM | POA: Diagnosis not present

## 2017-02-20 LAB — POCT INR: INR: 3.6

## 2017-02-20 NOTE — Patient Instructions (Signed)
Pre visit review using our clinic review tool, if applicable. No additional management support is needed unless otherwise documented below in the visit note. 

## 2017-02-26 ENCOUNTER — Telehealth: Payer: Self-pay | Admitting: Internal Medicine

## 2017-02-26 NOTE — Telephone Encounter (Signed)
Patient came into office last week.  He was questioning bill he received on 3/13 for $70.86.  After looking at this bill, coding was correct.  Patient had a welcome to medicare visit with a regular in regard to venous insufficiency and chronic embolism and thrombosis.  The $70.86 went to patients deductible with medicare RR.  [employer] did not pay on bill.  I have left patient a vm to call me back in regard.

## 2017-03-04 ENCOUNTER — Ambulatory Visit (INDEPENDENT_AMBULATORY_CARE_PROVIDER_SITE_OTHER): Payer: MEDICARE | Admitting: Internal Medicine

## 2017-03-04 ENCOUNTER — Encounter: Payer: Self-pay | Admitting: Internal Medicine

## 2017-03-04 DIAGNOSIS — R7989 Other specified abnormal findings of blood chemistry: Secondary | ICD-10-CM | POA: Diagnosis not present

## 2017-03-04 DIAGNOSIS — R945 Abnormal results of liver function studies: Secondary | ICD-10-CM

## 2017-03-04 DIAGNOSIS — G8929 Other chronic pain: Secondary | ICD-10-CM | POA: Diagnosis not present

## 2017-03-04 DIAGNOSIS — M545 Low back pain: Secondary | ICD-10-CM

## 2017-03-04 DIAGNOSIS — E538 Deficiency of other specified B group vitamins: Secondary | ICD-10-CM | POA: Diagnosis not present

## 2017-03-04 DIAGNOSIS — I471 Supraventricular tachycardia: Secondary | ICD-10-CM | POA: Diagnosis not present

## 2017-03-04 DIAGNOSIS — E785 Hyperlipidemia, unspecified: Secondary | ICD-10-CM

## 2017-03-04 MED ORDER — OXYCODONE HCL 5 MG PO TABS: 5.0000 mg | ORAL_TABLET | Freq: Three times a day (TID) | ORAL | 0 refills | Status: DC | PRN

## 2017-03-04 NOTE — Assessment & Plan Note (Signed)
Intolerant of Lopressor F/u w/Dr Johnsie Cancel

## 2017-03-04 NOTE — Assessment & Plan Note (Signed)
Chronic  Potential benefits of a long term opioids use as well as potential risks (i.e. addiction risk, apnea etc) and complications (i.e. Somnolence, constipation and others) were explained to the patient and were aknowledged.  on OxyIR now 

## 2017-03-04 NOTE — Assessment & Plan Note (Signed)
Declined statins. 

## 2017-03-04 NOTE — Progress Notes (Signed)
Subjective:  Patient ID: Robert [name], male    DOB: 12-21-1950  Age: 66 y.o. MRN: [id number]  CC: No chief complaint on file.   HPI Robert [name] presents for LBP, B12 def, gout, anticoagulation f/u  Outpatient Medications Prior to Visit  Medication Sig Dispense Refill  . albuterol (PROVENTIL) (2.5 MG/3ML) 0.083% nebulizer solution Take 3 mLs (2.5 mg total) by nebulization every 6 (six) hours as needed for wheezing or shortness of breath. 150 mL 3  . cetirizine (ZYRTEC) 10 MG tablet Take 10 mg by mouth daily.     . Cyanocobalamin (VITAMIN B-12) 5000 MCG SUBL Place 5,000 tablets under the tongue every morning.    . fish oil-omega-3 fatty acids 1000 MG capsule Take 2 g by mouth every morning.    . furosemide (LASIX) 20 MG tablet Take 1 tablet (20 mg total) by mouth daily. 90 tablet 3  . glucosamine-chondroitin 500-400 MG tablet Take 1 tablet by mouth every morning.    . nortriptyline (PAMELOR) 10 MG capsule TAKE 1 CAPSULE BY MOUTH EVERY EVENING 90 capsule 3  . oxyCODONE (OXY IR/ROXICODONE) 5 MG immediate release tablet Take 1 tablet (5 mg total) by mouth every 8 (eight) hours as needed. 90 tablet 0  . PROAIR HFA 108 (90 Base) MCG/ACT inhaler INHALE 2 PUFFS EVERY 6 HOURS AS NEEDED FOR WHEEZING/SHORTNESS OF BREATH 25.5 Inhaler 2  . triamcinolone cream (KENALOG) 0.5 % Apply 1 application topically 3 (three) times daily. 120 g 1  . warfarin (COUMADIN) 3 MG tablet AS DIRECTED 45 tablet 1  . warfarin (COUMADIN) 6 MG tablet USE AS DIRECTED 90 tablet 1  . allopurinol (ZYLOPRIM) 100 MG tablet Take 1 tablet (100 mg total) by mouth daily. 90 tablet 3  . colchicine 0.6 MG tablet Take 1 tablet (0.6 mg total) by mouth 2 (two) times daily as needed. 60 tablet 3  . promethazine (PHENERGAN) 25 MG suppository Place 1 suppository (25 mg total) rectally every 6 (six) hours as needed for nausea or vomiting. 12 each 1   No facility-administered medications prior to visit.     ROS Review of Systems    Constitutional: Positive for fatigue. Negative for appetite change and unexpected weight change.  HENT: Negative for congestion, nosebleeds, sneezing, sore throat and trouble swallowing.   Eyes: Negative for itching and visual disturbance.  Respiratory: Positive for wheezing. Negative for cough.   Cardiovascular: Positive for palpitations and leg swelling. Negative for chest pain.  Gastrointestinal: Negative for abdominal distention, blood in stool, diarrhea and nausea.  Genitourinary: Negative for frequency and hematuria.  Musculoskeletal: Positive for arthralgias, back pain and gait problem. Negative for joint swelling and neck pain.  Skin: Negative for rash.  Neurological: Negative for dizziness, tremors, speech difficulty and weakness.  Psychiatric/Behavioral: Negative for agitation, dysphoric mood, sleep disturbance and suicidal ideas. The patient is not nervous/anxious.     Objective:  BP 136/82 (BP Location: Left Arm, Patient Position: Sitting, Cuff Size: Large)   Pulse 76   Temp 97.8 F (36.6 C) (Oral)   Ht 5\' 11"  (1.803 m)   Wt 294 lb (133.4 kg)   SpO2 98%   BMI 41.00 kg/m   BP Readings from Last 3 Encounters:  03/04/17 136/82  09/02/16 116/78  02/18/16 118/60    Wt Readings from Last 3 Encounters:  03/04/17 294 lb (133.4 kg)  09/02/16 288 lb 12 oz (131 kg)  02/18/16 290 lb (131.5 kg)    Physical Exam  Constitutional: He is oriented  to person, place, and time. He appears well-developed. No distress.  NAD  HENT:  Mouth/Throat: Oropharynx is clear and moist.  Eyes: Pupils are equal, round, and reactive to light. Conjunctivae are normal.  Neck: Normal range of motion. No JVD present. No thyromegaly present.  Cardiovascular: Normal rate, regular rhythm, normal heart sounds and intact distal pulses.  Exam reveals no gallop and no friction rub.   No murmur heard. Pulmonary/Chest: Effort normal and breath sounds normal. No respiratory distress. He has no wheezes. He  has no rales. He exhibits no tenderness.  Abdominal: Soft. Bowel sounds are normal. He exhibits no distension and no mass. There is no tenderness. There is no rebound and no guarding.  Musculoskeletal: Normal range of motion. He exhibits edema and tenderness.  Lymphadenopathy:    He has no cervical adenopathy.  Neurological: He is alert and oriented to person, place, and time. He has normal reflexes. No cranial nerve deficit. He exhibits normal muscle tone. He displays a negative Romberg sign. Coordination and gait normal.  Skin: Skin is warm and dry. No rash noted.  Psychiatric: He has a normal mood and affect. His behavior is normal. Judgment and thought content normal.  R leg w/trace edema Obese Large midline hernia  Lab Results  Component Value Date   WBC 10.0 02/18/2016   HGB 16.2 02/18/2016   HCT 46.2 02/18/2016   PLT 312.0 02/18/2016   GLUCOSE 97 02/18/2016   CHOL 232 (H) 02/18/2016   TRIG 214.0 (H) 02/18/2016   HDL 29.40 (L) 02/18/2016   LDLDIRECT 162.0 02/18/2016   ALT 24 02/18/2016   AST 23 02/18/2016   NA 137 02/18/2016   K 5.1 02/18/2016   CL 98 02/18/2016   CREATININE 1.04 02/18/2016   BUN 13 02/18/2016   CO2 30 02/18/2016   TSH 3.18 02/18/2016   PSA 0.11 02/18/2016   INR 3.6 02/20/2017   HGBA1C 5.3 04/19/2007    Ct Angio Chest Pe W/cm &/or Wo Cm  Result Date: 07/04/2013 CLINICAL DATA:  Abdominal pain EXAM: CT ABDOMEN AND PELVIS WITH CONTRAST TECHNIQUE: Multidetector CT imaging of the abdomen and pelvis was performed using the standard protocol following bolus administration of intravenous contrast. CONTRAST:  190mL OMNIPAQUE IOHEXOL 350 MG/ML SOLN COMPARISON:  Prior CT from 11/20/2010. FINDINGS: CT CHEST: No pathologically enlarged mediastinal, hilar, or axillary lymph nodes are identified. The aortic great vessels demonstrate a normal contrast enhanced appearance. Heart size is within normal limits. Three-vessel coronary artery calcifications are present. No  pericardial effusion. Pulmonary arterial tree is well opacified. No filling defects are seen to suggest acute pulmonary embolism. Re-formatted imaging confirms these findings. The lungs are clear without focal infiltrate, pulmonary edema, or pleural effusion. Mild dependent atelectasis is seen within the visualized lung bases. An 8 mm subpleural nodule is present within the posterior right lower lobe (series 7, image 50), indeterminate. No other pulmonary nodules or masses identified. CT ABDOMEN AND PELVIS: Subcentimeter hypodensities present within the left hepatic lobe are present, stable as compared to prior study, and most likely reflect benign hepatic cysts. Additional irregular hypodense lesion measuring 1.9 cm is present within the caudate lobe of the liver is also consistent with a benign cyst. The liver is otherwise unremarkable. Gallbladder is surgically absent. No biliary ductal dilatation. The spleen, adrenal glands, and pancreas demonstrate a normal contrast enhanced appearance. Bilateral renal cysts are present, the largest of which is seen extending from the upper pole of the left kidney and measures 6.3 x 5.3 cm.  No nephrolithiasis, hydronephrosis, or focal enhancing renal mass. No evidence of bowel obstruction. No abnormal mucosal enhancement, wall thickening, or inflammatory fat stranding seen about the bowels. Surgical clips within the right lower quadrant are most consistent with prior appendectomy. Bladder is unremarkable.  Prostate is within normal limits. No free air or fluid identified. No pathologically enlarged intra-abdominal pelvic lymph nodes. Study is not optimized for evaluation of the venous structures, however, no definite thrombus seen within the IVC or bilateral iliac veins. Spinal fixation noted at the L5-S1 level. Grade 1 anterolisthesis of L4 on L5 this present. Multilevel degenerative changes present within the lower thoracic and lumbar spine. No acute osseous abnormality. No  focal lytic or blastic osseous lesions. IMPRESSION: CT CHEST: 1. No CT evidence of acute pulmonary embolism. No other acute abnormality identified within the thorax. 2. 8 mm subpleural right lower lobe nodule, indeterminate, but stable as compared to prior CT from 11/20/2010. 3. Coronary artery calcifications. CT ABDOMEN AND PELVIS: 1. No acute intra-abdominal or pelvic process identified. No definite occlusive or nonocclusive thrombus identified within the IVC or iliac vessels. However, please note that this study is not optimized for evaluation of the venous structures due to timing of the contrast bolus. Correlation with lower extremity Doppler ultrasound could be performed as clinically indicated. Electronically Signed   By: Jeannine Boga M.D.   On: 07/04/2013 16:35   Ct Abdomen Pelvis W Contrast  Result Date: 07/04/2013 CLINICAL DATA:  Abdominal pain EXAM: CT ABDOMEN AND PELVIS WITH CONTRAST TECHNIQUE: Multidetector CT imaging of the abdomen and pelvis was performed using the standard protocol following bolus administration of intravenous contrast. CONTRAST:  174mL OMNIPAQUE IOHEXOL 350 MG/ML SOLN COMPARISON:  Prior CT from 11/20/2010. FINDINGS: CT CHEST: No pathologically enlarged mediastinal, hilar, or axillary lymph nodes are identified. The aortic great vessels demonstrate a normal contrast enhanced appearance. Heart size is within normal limits. Three-vessel coronary artery calcifications are present. No pericardial effusion. Pulmonary arterial tree is well opacified. No filling defects are seen to suggest acute pulmonary embolism. Re-formatted imaging confirms these findings. The lungs are clear without focal infiltrate, pulmonary edema, or pleural effusion. Mild dependent atelectasis is seen within the visualized lung bases. An 8 mm subpleural nodule is present within the posterior right lower lobe (series 7, image 50), indeterminate. No other pulmonary nodules or masses identified. CT ABDOMEN  AND PELVIS: Subcentimeter hypodensities present within the left hepatic lobe are present, stable as compared to prior study, and most likely reflect benign hepatic cysts. Additional irregular hypodense lesion measuring 1.9 cm is present within the caudate lobe of the liver is also consistent with a benign cyst. The liver is otherwise unremarkable. Gallbladder is surgically absent. No biliary ductal dilatation. The spleen, adrenal glands, and pancreas demonstrate a normal contrast enhanced appearance. Bilateral renal cysts are present, the largest of which is seen extending from the upper pole of the left kidney and measures 6.3 x 5.3 cm. No nephrolithiasis, hydronephrosis, or focal enhancing renal mass. No evidence of bowel obstruction. No abnormal mucosal enhancement, wall thickening, or inflammatory fat stranding seen about the bowels. Surgical clips within the right lower quadrant are most consistent with prior appendectomy. Bladder is unremarkable.  Prostate is within normal limits. No free air or fluid identified. No pathologically enlarged intra-abdominal pelvic lymph nodes. Study is not optimized for evaluation of the venous structures, however, no definite thrombus seen within the IVC or bilateral iliac veins. Spinal fixation noted at the L5-S1 level. Grade 1 anterolisthesis of L4  on L5 this present. Multilevel degenerative changes present within the lower thoracic and lumbar spine. No acute osseous abnormality. No focal lytic or blastic osseous lesions. IMPRESSION: CT CHEST: 1. No CT evidence of acute pulmonary embolism. No other acute abnormality identified within the thorax. 2. 8 mm subpleural right lower lobe nodule, indeterminate, but stable as compared to prior CT from 11/20/2010. 3. Coronary artery calcifications. CT ABDOMEN AND PELVIS: 1. No acute intra-abdominal or pelvic process identified. No definite occlusive or nonocclusive thrombus identified within the IVC or iliac vessels. However, please  note that this study is not optimized for evaluation of the venous structures due to timing of the contrast bolus. Correlation with lower extremity Doppler ultrasound could be performed as clinically indicated. Electronically Signed   By: Jeannine Boga M.D.   On: 07/04/2013 16:35    Assessment & Plan:   There are no diagnoses linked to this encounter. I have discontinued Mr. Mendenhall's allopurinol, colchicine, and promethazine. I am also having him maintain his cetirizine, fish oil-omega-3 fatty acids, glucosamine-chondroitin, Vitamin B-12, albuterol, triamcinolone cream, nortriptyline, furosemide, PROAIR HFA, warfarin, warfarin, and oxyCODONE.  No orders of the defined types were placed in this encounter.    Follow-up: No Follow-up on file.  Walker Kehr, MD

## 2017-03-04 NOTE — Assessment & Plan Note (Signed)
On Coumadin 

## 2017-03-04 NOTE — Assessment & Plan Note (Signed)
LFTs 

## 2017-03-04 NOTE — Assessment & Plan Note (Signed)
On B12 

## 2017-03-10 ENCOUNTER — Other Ambulatory Visit (INDEPENDENT_AMBULATORY_CARE_PROVIDER_SITE_OTHER): Payer: MEDICARE

## 2017-03-10 DIAGNOSIS — Z0000 Encounter for general adult medical examination without abnormal findings: Secondary | ICD-10-CM

## 2017-03-10 LAB — CBC WITH DIFFERENTIAL/PLATELET
Basophils Absolute: 0.1 10*3/uL (ref 0.0–0.1)
Basophils Relative: 0.9 % (ref 0.0–3.0)
Eosinophils Absolute: 1 10*3/uL — ABNORMAL HIGH (ref 0.0–0.7)
Eosinophils Relative: 13.3 % — ABNORMAL HIGH (ref 0.0–5.0)
HCT: 47 % (ref 39.0–52.0)
Hemoglobin: 16.3 g/dL (ref 13.0–17.0)
Lymphocytes Relative: 27 % (ref 12.0–46.0)
Lymphs Abs: 2.1 10*3/uL (ref 0.7–4.0)
MCHC: 34.7 g/dL (ref 30.0–36.0)
MCV: 93.9 fl (ref 78.0–100.0)
Monocytes Absolute: 0.9 10*3/uL (ref 0.1–1.0)
Monocytes Relative: 12 % (ref 3.0–12.0)
Neutro Abs: 3.6 10*3/uL (ref 1.4–7.7)
Neutrophils Relative %: 46.8 % (ref 43.0–77.0)
Platelets: 277 10*3/uL (ref 150.0–400.0)
RBC: 5 Mil/uL (ref 4.22–5.81)
RDW: 12.5 % (ref 11.5–15.5)
WBC: 7.7 10*3/uL (ref 4.0–10.5)

## 2017-03-10 LAB — URINALYSIS
Bilirubin Urine: NEGATIVE
Ketones, ur: NEGATIVE
Leukocytes, UA: NEGATIVE
Nitrite: NEGATIVE
Specific Gravity, Urine: 1.015 (ref 1.000–1.030)
Total Protein, Urine: NEGATIVE
Urine Glucose: NEGATIVE
Urobilinogen, UA: 0.2 (ref 0.0–1.0)
pH: 5 (ref 5.0–8.0)

## 2017-03-10 LAB — LIPID PANEL
Cholesterol: 213 mg/dL — ABNORMAL HIGH (ref 0–200)
HDL: 36.2 mg/dL — ABNORMAL LOW (ref >39.00)
NonHDL: 177.18
Total CHOL/HDL Ratio: 6
Triglycerides: 253 mg/dL — ABNORMAL HIGH (ref 0.0–149.0)
VLDL: 50.6 mg/dL — ABNORMAL HIGH (ref 0.0–40.0)

## 2017-03-10 LAB — BASIC METABOLIC PANEL
BUN: 16 mg/dL (ref 6–23)
CO2: 27 mEq/L (ref 19–32)
Calcium: 9.4 mg/dL (ref 8.4–10.5)
Chloride: 100 mEq/L (ref 96–112)
Creatinine, Ser: 0.99 mg/dL (ref 0.40–1.50)
GFR: 80.52 mL/min (ref >60.00)
Glucose, Bld: 101 mg/dL — ABNORMAL HIGH (ref 70–99)
Potassium: 4.6 mEq/L (ref 3.5–5.1)
Sodium: 138 mEq/L (ref 135–145)

## 2017-03-10 LAB — HEPATIC FUNCTION PANEL
ALT: 21 U/L (ref 0–53)
AST: 23 U/L (ref 0–37)
Albumin: 4 g/dL (ref 3.5–5.2)
Alkaline Phosphatase: 72 U/L (ref 39–117)
Bilirubin, Direct: 0.1 mg/dL (ref 0.0–0.3)
Total Bilirubin: 0.6 mg/dL (ref 0.2–1.2)
Total Protein: 7.6 g/dL (ref 6.0–8.3)

## 2017-03-10 LAB — PSA: PSA: 0.14 ng/mL (ref 0.10–4.00)

## 2017-03-10 LAB — TSH: TSH: 5 u[IU]/mL — ABNORMAL HIGH (ref 0.35–4.50)

## 2017-03-10 LAB — LDL CHOLESTEROL, DIRECT: Direct LDL: 137 mg/dL

## 2017-03-11 ENCOUNTER — Encounter: Payer: Self-pay | Admitting: Internal Medicine

## 2017-03-11 ENCOUNTER — Other Ambulatory Visit: Payer: Self-pay | Admitting: Internal Medicine

## 2017-03-11 DIAGNOSIS — E785 Hyperlipidemia, unspecified: Secondary | ICD-10-CM

## 2017-03-20 ENCOUNTER — Other Ambulatory Visit: Payer: Self-pay | Admitting: General Practice

## 2017-03-20 ENCOUNTER — Ambulatory Visit (INDEPENDENT_AMBULATORY_CARE_PROVIDER_SITE_OTHER): Payer: MEDICARE | Admitting: General Practice

## 2017-03-20 DIAGNOSIS — Z7901 Long term (current) use of anticoagulants: Secondary | ICD-10-CM

## 2017-03-20 DIAGNOSIS — Z5181 Encounter for therapeutic drug level monitoring: Secondary | ICD-10-CM | POA: Diagnosis not present

## 2017-03-20 DIAGNOSIS — Z86711 Personal history of pulmonary embolism: Secondary | ICD-10-CM | POA: Diagnosis not present

## 2017-03-20 LAB — POCT INR: INR: 3.8

## 2017-03-20 MED ORDER — WARFARIN SODIUM 3 MG PO TABS: ORAL_TABLET | ORAL | 1 refills | Status: DC

## 2017-03-20 NOTE — Patient Instructions (Signed)
Pre visit review using our clinic review tool, if applicable. No additional management support is needed unless otherwise documented below in the visit note. 

## 2017-03-20 NOTE — Progress Notes (Signed)
I have reviewed and agree with the plan. 

## 2017-03-25 ENCOUNTER — Other Ambulatory Visit: Payer: Self-pay | Admitting: Internal Medicine

## 2017-04-16 ENCOUNTER — Other Ambulatory Visit: Payer: Self-pay | Admitting: Internal Medicine

## 2017-04-17 ENCOUNTER — Ambulatory Visit (INDEPENDENT_AMBULATORY_CARE_PROVIDER_SITE_OTHER): Payer: MEDICARE | Admitting: General Practice

## 2017-04-17 DIAGNOSIS — I82409 Acute embolism and thrombosis of unspecified deep veins of unspecified lower extremity: Secondary | ICD-10-CM | POA: Diagnosis not present

## 2017-04-17 DIAGNOSIS — Z86711 Personal history of pulmonary embolism: Secondary | ICD-10-CM | POA: Diagnosis not present

## 2017-04-17 DIAGNOSIS — Z7901 Long term (current) use of anticoagulants: Secondary | ICD-10-CM

## 2017-04-17 LAB — POCT INR: INR: 2.7

## 2017-04-17 NOTE — Patient Instructions (Signed)
Pre visit review using our clinic review tool, if applicable. No additional management support is needed unless otherwise documented below in the visit note. 

## 2017-04-20 NOTE — Telephone Encounter (Signed)
Pt wife called regarding this, please advise and call wife when ready to pick up

## 2017-04-20 NOTE — Telephone Encounter (Signed)
Check [place] registry last filled 03/04/2017. Pls advise...Robert [name]

## 2017-04-21 MED ORDER — OXYCODONE HCL 5 MG PO TABS: 5.0000 mg | ORAL_TABLET | Freq: Three times a day (TID) | ORAL | 0 refills | Status: AC | PRN

## 2017-04-21 NOTE — Telephone Encounter (Signed)
Pt called again about this med

## 2017-04-22 NOTE — Telephone Encounter (Signed)
Wife has been notified by [place] [facility] ready for pick-up. Place in cabinet...[name]

## 2017-04-29 ENCOUNTER — Telehealth: Payer: Self-pay | Admitting: Internal Medicine

## 2017-04-29 NOTE — Telephone Encounter (Signed)
Error, close phone note.

## 2017-04-29 NOTE — Telephone Encounter (Signed)
Pt sent massage through mychart request refill for oxycodone 5 mg immediate release tab. Pt request to pick this up on 05/18/2017. Please advise.

## 2017-04-30 DIAGNOSIS — M21161 Varus deformity, not elsewhere classified, right knee: Secondary | ICD-10-CM | POA: Diagnosis not present

## 2017-04-30 DIAGNOSIS — M1711 Unilateral primary osteoarthritis, right knee: Secondary | ICD-10-CM | POA: Diagnosis not present

## 2017-04-30 DIAGNOSIS — M25561 Pain in right knee: Secondary | ICD-10-CM | POA: Diagnosis not present

## 2017-04-30 DIAGNOSIS — Z96652 Presence of left artificial knee joint: Secondary | ICD-10-CM | POA: Diagnosis not present

## 2017-04-30 NOTE — Telephone Encounter (Signed)
Ok Thx 

## 2017-05-07 DIAGNOSIS — M1711 Unilateral primary osteoarthritis, right knee: Secondary | ICD-10-CM | POA: Diagnosis not present

## 2017-05-07 DIAGNOSIS — M25561 Pain in right knee: Secondary | ICD-10-CM | POA: Diagnosis not present

## 2017-05-12 ENCOUNTER — Ambulatory Visit (INDEPENDENT_AMBULATORY_CARE_PROVIDER_SITE_OTHER): Payer: MEDICARE | Admitting: General Practice

## 2017-05-12 DIAGNOSIS — Z7901 Long term (current) use of anticoagulants: Secondary | ICD-10-CM | POA: Diagnosis not present

## 2017-05-12 DIAGNOSIS — Z86711 Personal history of pulmonary embolism: Secondary | ICD-10-CM

## 2017-05-12 NOTE — Patient Instructions (Addendum)
Pre visit review using our clinic review tool, if applicable. No additional management support is needed unless otherwise documented below in the visit note.  Continue to take 6 mg all days except 3 mg on Mondays and Fridays.  Re-check in 6 weeks. 

## 2017-05-13 DIAGNOSIS — M25561 Pain in right knee: Secondary | ICD-10-CM | POA: Diagnosis not present

## 2017-05-13 DIAGNOSIS — M1711 Unilateral primary osteoarthritis, right knee: Secondary | ICD-10-CM | POA: Diagnosis not present

## 2017-05-28 ENCOUNTER — Other Ambulatory Visit (INDEPENDENT_AMBULATORY_CARE_PROVIDER_SITE_OTHER): Payer: MEDICARE

## 2017-05-28 DIAGNOSIS — Z7901 Long term (current) use of anticoagulants: Secondary | ICD-10-CM | POA: Diagnosis not present

## 2017-05-28 DIAGNOSIS — Z86711 Personal history of pulmonary embolism: Secondary | ICD-10-CM | POA: Diagnosis not present

## 2017-05-28 DIAGNOSIS — E785 Hyperlipidemia, unspecified: Secondary | ICD-10-CM | POA: Diagnosis not present

## 2017-05-28 LAB — LIPID PANEL
Cholesterol: 204 mg/dL — ABNORMAL HIGH (ref 0–200)
HDL: 31.8 mg/dL — ABNORMAL LOW (ref >39.00)
LDL Cholesterol: 133 mg/dL — ABNORMAL HIGH (ref 0–99)
NonHDL: 171.99
Total CHOL/HDL Ratio: 6
Triglycerides: 197 mg/dL — ABNORMAL HIGH (ref 0.0–149.0)
VLDL: 39.4 mg/dL (ref 0.0–40.0)

## 2017-05-28 LAB — T4, FREE: Free T4: 0.68 ng/dL (ref 0.60–1.60)

## 2017-05-28 LAB — TSH: TSH: 4.23 u[IU]/mL (ref 0.35–4.50)

## 2017-05-28 LAB — PROTIME-INR
INR: 2.6 ratio — ABNORMAL HIGH (ref 0.8–1.0)
Prothrombin Time: 27.9 s — ABNORMAL HIGH (ref 9.6–13.1)

## 2017-06-01 ENCOUNTER — Ambulatory Visit: Payer: MEDICARE | Admitting: Internal Medicine

## 2017-06-08 ENCOUNTER — Other Ambulatory Visit: Payer: Self-pay | Admitting: Internal Medicine

## 2017-06-08 ENCOUNTER — Other Ambulatory Visit: Payer: Self-pay | Admitting: General Practice

## 2017-06-08 MED ORDER — WARFARIN SODIUM 6 MG PO TABS: ORAL_TABLET | ORAL | 1 refills | Status: DC

## 2017-06-10 DIAGNOSIS — L570 Actinic keratosis: Secondary | ICD-10-CM | POA: Diagnosis not present

## 2017-06-10 DIAGNOSIS — L219 Seborrheic dermatitis, unspecified: Secondary | ICD-10-CM | POA: Diagnosis not present

## 2017-06-30 ENCOUNTER — Encounter: Payer: Self-pay | Admitting: Internal Medicine

## 2017-07-01 MED ORDER — ALBUTEROL SULFATE (2.5 MG/3ML) 0.083% IN NEBU: 2.5000 mg | INHALATION_SOLUTION | Freq: Four times a day (QID) | RESPIRATORY_TRACT | 3 refills | Status: DC | PRN

## 2017-07-01 NOTE — Telephone Encounter (Signed)
RX reordered, please see message about billing question

## 2017-07-05 ENCOUNTER — Encounter: Payer: Self-pay | Admitting: Internal Medicine

## 2017-07-07 ENCOUNTER — Ambulatory Visit: Payer: MEDICARE

## 2017-07-07 ENCOUNTER — Ambulatory Visit: Payer: MEDICARE | Admitting: Internal Medicine

## 2017-07-10 ENCOUNTER — Ambulatory Visit (INDEPENDENT_AMBULATORY_CARE_PROVIDER_SITE_OTHER): Payer: MEDICARE | Admitting: General Practice

## 2017-07-10 DIAGNOSIS — Z86711 Personal history of pulmonary embolism: Secondary | ICD-10-CM | POA: Diagnosis not present

## 2017-07-10 DIAGNOSIS — Z7901 Long term (current) use of anticoagulants: Secondary | ICD-10-CM | POA: Diagnosis not present

## 2017-07-10 LAB — POCT INR: INR: 2.8

## 2017-07-10 NOTE — Patient Instructions (Addendum)
Pre visit review using our clinic review tool, if applicable. No additional management support is needed unless otherwise documented below in the visit note.  Continue to take 6 mg all days except 3 mg on Mondays and Fridays.  Re-check in 6 weeks. 

## 2017-07-16 ENCOUNTER — Telehealth: Payer: Self-pay

## 2017-07-16 NOTE — Telephone Encounter (Signed)
KEY: [id number]

## 2017-07-17 ENCOUNTER — Other Ambulatory Visit: Payer: Self-pay

## 2017-07-17 MED ORDER — ALBUTEROL SULFATE (2.5 MG/3ML) 0.083% IN NEBU: 2.5000 mg | INHALATION_SOLUTION | Freq: Four times a day (QID) | RESPIRATORY_TRACT | 3 refills | Status: DC | PRN

## 2017-07-17 NOTE — Telephone Encounter (Signed)
Patient calling and stating that Medicare Part B should be billed for this. Would like to speak with nurse. Call back [phone]

## 2017-07-17 NOTE — Telephone Encounter (Signed)
New RX sent with ICD10 code

## 2017-07-21 DIAGNOSIS — H524 Presbyopia: Secondary | ICD-10-CM | POA: Diagnosis not present

## 2017-07-21 DIAGNOSIS — H31101 Choroidal degeneration, unspecified, right eye: Secondary | ICD-10-CM | POA: Diagnosis not present

## 2017-07-22 ENCOUNTER — Telehealth: Payer: Self-pay | Admitting: Internal Medicine

## 2017-07-22 MED ORDER — ALBUTEROL SULFATE (2.5 MG/3ML) 0.083% IN NEBU: 2.5000 mg | INHALATION_SOLUTION | Freq: Four times a day (QID) | RESPIRATORY_TRACT | 3 refills | Status: DC | PRN

## 2017-07-22 NOTE — Telephone Encounter (Signed)
Copied from [place] [id number]. Topic: Quick Communication - See Telephone Encounter >> Jul 22, 2017 12:27 PM Bea Graff, NT wrote: CRM for notification. See Telephone encounter for: [facility] calling and states that the   albuterol (PROVENTIL) (2.5 MG/3ML) 0.083% nebulizer solution ICD10 code needs to be more specific. They need it re faxed in with updated code.  07/22/17.

## 2017-07-22 NOTE — Telephone Encounter (Signed)
Resent rx w/dx attach " Asthma".Marland KitchenJohny [name]

## 2017-08-20 ENCOUNTER — Other Ambulatory Visit: Payer: Self-pay | Admitting: Internal Medicine

## 2017-08-21 ENCOUNTER — Ambulatory Visit (INDEPENDENT_AMBULATORY_CARE_PROVIDER_SITE_OTHER): Payer: MEDICARE | Admitting: General Practice

## 2017-08-21 ENCOUNTER — Other Ambulatory Visit: Payer: Self-pay | Admitting: General Practice

## 2017-08-21 DIAGNOSIS — Z86711 Personal history of pulmonary embolism: Secondary | ICD-10-CM

## 2017-08-21 DIAGNOSIS — Z7901 Long term (current) use of anticoagulants: Secondary | ICD-10-CM | POA: Diagnosis not present

## 2017-08-21 LAB — POCT INR: INR: 3

## 2017-08-21 MED ORDER — WARFARIN SODIUM 3 MG PO TABS: ORAL_TABLET | ORAL | 1 refills | Status: DC

## 2017-08-21 NOTE — Patient Instructions (Addendum)
Pre visit review using our clinic review tool, if applicable. No additional management support is needed unless otherwise documented below in the visit note.  Continue to take 6 mg all days except 3 mg on Mondays and Fridays.  Re-check in 6 weeks. 

## 2017-08-21 NOTE — Telephone Encounter (Signed)
Routing to [employer] for refill mgmnt

## 2017-09-06 ENCOUNTER — Other Ambulatory Visit: Payer: Self-pay | Admitting: Internal Medicine

## 2017-09-17 ENCOUNTER — Other Ambulatory Visit: Payer: Self-pay

## 2017-09-17 MED ORDER — FUROSEMIDE 20 MG PO TABS: 20.0000 mg | ORAL_TABLET | Freq: Every day | ORAL | 1 refills | Status: DC

## 2017-10-02 ENCOUNTER — Ambulatory Visit (INDEPENDENT_AMBULATORY_CARE_PROVIDER_SITE_OTHER): Payer: MEDICARE | Admitting: General Practice

## 2017-10-02 DIAGNOSIS — Z86711 Personal history of pulmonary embolism: Secondary | ICD-10-CM

## 2017-10-02 DIAGNOSIS — Z7901 Long term (current) use of anticoagulants: Secondary | ICD-10-CM | POA: Diagnosis not present

## 2017-10-02 LAB — POCT INR: INR: 2.7

## 2017-10-02 NOTE — Patient Instructions (Addendum)
Pre visit review using our clinic review tool, if applicable. No additional management support is needed unless otherwise documented below in the visit note.  Continue to take 6 mg all days except 3 mg on Mondays and Fridays.  Re-check in 6 weeks. 

## 2017-11-13 ENCOUNTER — Ambulatory Visit: Payer: MEDICARE

## 2017-11-17 ENCOUNTER — Ambulatory Visit (INDEPENDENT_AMBULATORY_CARE_PROVIDER_SITE_OTHER): Payer: MEDICARE | Admitting: General Practice

## 2017-11-17 DIAGNOSIS — Z86711 Personal history of pulmonary embolism: Secondary | ICD-10-CM

## 2017-11-17 DIAGNOSIS — I82403 Acute embolism and thrombosis of unspecified deep veins of lower extremity, bilateral: Secondary | ICD-10-CM

## 2017-11-17 DIAGNOSIS — Z7901 Long term (current) use of anticoagulants: Secondary | ICD-10-CM | POA: Diagnosis not present

## 2017-11-17 LAB — POCT INR: INR: 2.4 (ref 2.0–3.0)

## 2017-11-17 NOTE — Patient Instructions (Addendum)
Pre visit review using our clinic review tool, if applicable. No additional management support is needed unless otherwise documented below in the visit note.  Continue to take 6 mg all days except 3 mg on Mondays and Fridays.  Re-check in 6 weeks. 

## 2017-11-29 ENCOUNTER — Other Ambulatory Visit: Payer: Self-pay | Admitting: Internal Medicine

## 2017-12-29 ENCOUNTER — Ambulatory Visit (INDEPENDENT_AMBULATORY_CARE_PROVIDER_SITE_OTHER): Payer: MEDICARE | Admitting: General Practice

## 2017-12-29 DIAGNOSIS — Z7901 Long term (current) use of anticoagulants: Secondary | ICD-10-CM | POA: Diagnosis not present

## 2017-12-29 DIAGNOSIS — Z86711 Personal history of pulmonary embolism: Secondary | ICD-10-CM

## 2017-12-29 LAB — POCT INR: INR: 2.8 (ref 2.0–3.0)

## 2017-12-29 NOTE — Patient Instructions (Addendum)
Pre visit review using our clinic review tool, if applicable. No additional management support is needed unless otherwise documented below in the visit note.  Continue to take 6 mg all days except 3 mg on Mondays and Fridays.  Re-check in 6 weeks. 

## 2018-01-06 ENCOUNTER — Encounter: Payer: Self-pay | Admitting: Internal Medicine

## 2018-01-06 DIAGNOSIS — E785 Hyperlipidemia, unspecified: Secondary | ICD-10-CM

## 2018-01-06 DIAGNOSIS — R7989 Other specified abnormal findings of blood chemistry: Secondary | ICD-10-CM

## 2018-01-06 DIAGNOSIS — R945 Abnormal results of liver function studies: Secondary | ICD-10-CM

## 2018-01-06 DIAGNOSIS — D649 Anemia, unspecified: Secondary | ICD-10-CM

## 2018-01-15 ENCOUNTER — Encounter: Payer: Self-pay | Admitting: Internal Medicine

## 2018-01-15 ENCOUNTER — Other Ambulatory Visit: Payer: Self-pay | Admitting: Family

## 2018-01-15 MED ORDER — LEVOCETIRIZINE DIHYDROCHLORIDE 5 MG PO TABS: 5.0000 mg | ORAL_TABLET | Freq: Every evening | ORAL | Status: DC

## 2018-01-18 ENCOUNTER — Encounter: Payer: Self-pay | Admitting: Internal Medicine

## 2018-02-05 ENCOUNTER — Encounter: Payer: Self-pay | Admitting: Internal Medicine

## 2018-02-05 ENCOUNTER — Other Ambulatory Visit (INDEPENDENT_AMBULATORY_CARE_PROVIDER_SITE_OTHER): Payer: MEDICARE

## 2018-02-05 ENCOUNTER — Ambulatory Visit (INDEPENDENT_AMBULATORY_CARE_PROVIDER_SITE_OTHER): Payer: MEDICARE | Admitting: Internal Medicine

## 2018-02-05 VITALS — BP 124/76 | HR 76 | Temp 98.5°F | Ht 71.0 in | Wt 305.0 lb

## 2018-02-05 DIAGNOSIS — R945 Abnormal results of liver function studies: Secondary | ICD-10-CM | POA: Diagnosis not present

## 2018-02-05 DIAGNOSIS — Z7901 Long term (current) use of anticoagulants: Secondary | ICD-10-CM

## 2018-02-05 DIAGNOSIS — E538 Deficiency of other specified B group vitamins: Secondary | ICD-10-CM

## 2018-02-05 DIAGNOSIS — R7989 Other specified abnormal findings of blood chemistry: Secondary | ICD-10-CM

## 2018-02-05 DIAGNOSIS — Z86711 Personal history of pulmonary embolism: Secondary | ICD-10-CM

## 2018-02-05 DIAGNOSIS — E785 Hyperlipidemia, unspecified: Secondary | ICD-10-CM

## 2018-02-05 DIAGNOSIS — Z6841 Body Mass Index (BMI) 40.0 and over, adult: Secondary | ICD-10-CM

## 2018-02-05 DIAGNOSIS — Z23 Encounter for immunization: Secondary | ICD-10-CM

## 2018-02-05 DIAGNOSIS — R600 Localized edema: Secondary | ICD-10-CM

## 2018-02-05 DIAGNOSIS — R05 Cough: Secondary | ICD-10-CM | POA: Diagnosis not present

## 2018-02-05 DIAGNOSIS — D649 Anemia, unspecified: Secondary | ICD-10-CM

## 2018-02-05 DIAGNOSIS — R059 Cough, unspecified: Secondary | ICD-10-CM

## 2018-02-05 LAB — LIPID PANEL
Cholesterol: 217 mg/dL — ABNORMAL HIGH (ref 0–200)
HDL: 29.9 mg/dL — ABNORMAL LOW (ref >39.00)
LDL Cholesterol: 151 mg/dL — ABNORMAL HIGH (ref 0–99)
NonHDL: 186.76
Total CHOL/HDL Ratio: 7
Triglycerides: 181 mg/dL — ABNORMAL HIGH (ref 0.0–149.0)
VLDL: 36.2 mg/dL (ref 0.0–40.0)

## 2018-02-05 LAB — TSH: TSH: 3.37 u[IU]/mL (ref 0.35–4.50)

## 2018-02-05 LAB — PROTIME-INR
INR: 2.8 ratio — ABNORMAL HIGH (ref 0.8–1.0)
Prothrombin Time: 32.3 s — ABNORMAL HIGH (ref 9.6–13.1)

## 2018-02-05 LAB — BASIC METABOLIC PANEL
BUN: 16 mg/dL (ref 6–23)
CO2: 29 mEq/L (ref 19–32)
Calcium: 9.4 mg/dL (ref 8.4–10.5)
Chloride: 102 mEq/L (ref 96–112)
Creatinine, Ser: 1.07 mg/dL (ref 0.40–1.50)
GFR: 73.41 mL/min (ref >60.00)
Glucose, Bld: 120 mg/dL — ABNORMAL HIGH (ref 70–99)
Potassium: 4.3 mEq/L (ref 3.5–5.1)
Sodium: 137 mEq/L (ref 135–145)

## 2018-02-05 LAB — URINALYSIS
Bilirubin Urine: NEGATIVE
Ketones, ur: NEGATIVE
Leukocytes, UA: NEGATIVE
Nitrite: NEGATIVE
Specific Gravity, Urine: 1.02 (ref 1.000–1.030)
Urine Glucose: NEGATIVE
Urobilinogen, UA: 0.2 (ref 0.0–1.0)
pH: 6.5 (ref 5.0–8.0)

## 2018-02-05 LAB — CBC WITH DIFFERENTIAL/PLATELET
Basophils Absolute: 0.1 10*3/uL (ref 0.0–0.1)
Basophils Relative: 1.5 % (ref 0.0–3.0)
Eosinophils Absolute: 0.9 10*3/uL — ABNORMAL HIGH (ref 0.0–0.7)
Eosinophils Relative: 12.1 % — ABNORMAL HIGH (ref 0.0–5.0)
HCT: 46.9 % (ref 39.0–52.0)
Hemoglobin: 16 g/dL (ref 13.0–17.0)
Lymphocytes Relative: 29.6 % (ref 12.0–46.0)
Lymphs Abs: 2.1 10*3/uL (ref 0.7–4.0)
MCHC: 34.1 g/dL (ref 30.0–36.0)
MCV: 93.6 fl (ref 78.0–100.0)
Monocytes Absolute: 0.8 10*3/uL (ref 0.1–1.0)
Monocytes Relative: 10.5 % (ref 3.0–12.0)
Neutro Abs: 3.3 10*3/uL (ref 1.4–7.7)
Neutrophils Relative %: 46.3 % (ref 43.0–77.0)
Platelets: 275 10*3/uL (ref 150.0–400.0)
RBC: 5.01 Mil/uL (ref 4.22–5.81)
RDW: 13.6 % (ref 11.5–15.5)
WBC: 7.2 10*3/uL (ref 4.0–10.5)

## 2018-02-05 LAB — HEPATIC FUNCTION PANEL
ALT: 21 U/L (ref 0–53)
AST: 22 U/L (ref 0–37)
Albumin: 4 g/dL (ref 3.5–5.2)
Alkaline Phosphatase: 69 U/L (ref 39–117)
Bilirubin, Direct: 0.1 mg/dL (ref 0.0–0.3)
Total Bilirubin: 0.7 mg/dL (ref 0.2–1.2)
Total Protein: 7.8 g/dL (ref 6.0–8.3)

## 2018-02-05 MED ORDER — NORTRIPTYLINE HCL 10 MG PO CAPS: 10.0000 mg | ORAL_CAPSULE | Freq: Every evening | ORAL | 3 refills | Status: DC

## 2018-02-05 MED ORDER — FUROSEMIDE 20 MG PO TABS: 20.0000 mg | ORAL_TABLET | Freq: Every day | ORAL | 3 refills | Status: DC

## 2018-02-05 MED ORDER — WARFARIN SODIUM 6 MG PO TABS
ORAL_TABLET | ORAL | 3 refills | Status: DC
Start: 2018-02-05 — End: 2019-03-04

## 2018-02-05 NOTE — Assessment & Plan Note (Signed)
No change 

## 2018-02-05 NOTE — Assessment & Plan Note (Signed)
Chronic - post phlebitic syndrome

## 2018-02-05 NOTE — Assessment & Plan Note (Signed)
BMI 41 Class 3 Discussed

## 2018-02-05 NOTE — Assessment & Plan Note (Signed)
Labs

## 2018-02-05 NOTE — Assessment & Plan Note (Signed)
On B12 

## 2018-02-05 NOTE — Progress Notes (Signed)
Subjective:  Patient ID: Robert [name], male    DOB: 1951/03/22  Age: 67 y.o. MRN: [id number]  CC: No chief complaint on file.   HPI Robert [name] presents for LBP, DVT C/o fatigue since Oct 2018; pain in the arm muscles  Outpatient Medications Prior to Visit  Medication Sig Dispense Refill  . albuterol (PROVENTIL) (2.5 MG/3ML) 0.083% nebulizer solution Take 3 mLs (2.5 mg total) by nebulization every 6 (six) hours as needed for wheezing or shortness of breath. Dx  J45.909 150 mL 3  . Cyanocobalamin (VITAMIN B-12) 5000 MCG SUBL Place 5,000 tablets under the tongue every morning.    . fish oil-omega-3 fatty acids 1000 MG capsule Take 2 g by mouth every morning.    . furosemide (LASIX) 20 MG tablet Take 1 tablet (20 mg total) by mouth daily. 90 tablet 1  . glucosamine-chondroitin 500-400 MG tablet Take 1 tablet by mouth every morning.    [name] levocetirizine (XYZAL) 5 MG tablet Take 1 tablet (5 mg total) by mouth every evening.    . nortriptyline (PAMELOR) 10 MG capsule TAKE 1 CAPSULE BY MOUTH EVERY EVENING 90 capsule 3  . PROAIR HFA 108 (90 Base) MCG/ACT inhaler INHALE 2 PUFFS EVERY 6 HOURS AS NEEDED FOR WHEEZING/SHORTNESS OF BREATH 25.5 Inhaler 2  . triamcinolone cream (KENALOG) 0.5 % Apply 1 application topically 3 (three) times daily. 120 g 1  . warfarin (COUMADIN) 3 MG tablet AS DIRECTED 30 tablet 1  . warfarin (COUMADIN) 6 MG tablet USE AS DIRECTED 90 tablet 1   No facility-administered medications prior to visit.     ROS: Review of Systems  Constitutional: Positive for fatigue. Negative for appetite change and unexpected weight change.  HENT: Negative for congestion, nosebleeds, sneezing, sore throat and trouble swallowing.   Eyes: Negative for itching and visual disturbance.  Respiratory: Negative for cough.   Cardiovascular: Positive for leg swelling. Negative for chest pain and palpitations.  Gastrointestinal: Negative for abdominal distention, blood in stool, diarrhea  and nausea.  Genitourinary: Negative for frequency and hematuria.  Musculoskeletal: Positive for arthralgias, back pain and gait problem. Negative for joint swelling and neck pain.  Skin: Positive for color change. Negative for rash.  Neurological: Negative for dizziness, tremors, speech difficulty and weakness.  Psychiatric/Behavioral: Negative for agitation, dysphoric mood, sleep disturbance and suicidal ideas. The patient is not nervous/anxious.     Objective:  BP 124/76 (BP Location: Left Arm, Patient Position: Sitting, Cuff Size: Large)   Pulse 76   Temp 98.5 F (36.9 C) (Oral)   Ht 5\' 11"  (1.803 m)   Wt (!) 305 lb (138.3 kg)   SpO2 95%   BMI 42.54 kg/m   BP Readings from Last 3 Encounters:  02/05/18 124/76  03/04/17 136/82  09/02/16 116/78    Wt Readings from Last 3 Encounters:  02/05/18 (!) 305 lb (138.3 kg)  03/04/17 294 lb (133.4 kg)  09/02/16 288 lb 12 oz (131 kg)    Physical Exam  Constitutional: He is oriented to person, place, and time. He appears well-developed. No distress.  NAD  HENT:  Mouth/Throat: Oropharynx is clear and moist.  Eyes: Pupils are equal, round, and reactive to light. Conjunctivae are normal.  Neck: Normal range of motion. No JVD present. No thyromegaly present.  Cardiovascular: Normal rate, regular rhythm, normal heart sounds and intact distal pulses. Exam reveals no gallop and no friction rub.  No murmur heard. Pulmonary/Chest: Effort normal and breath sounds normal. No respiratory distress. He  has no wheezes. He has no rales. He exhibits no tenderness.  Abdominal: Soft. Bowel sounds are normal. He exhibits no distension and no mass. There is no tenderness. There is no rebound and no guarding.  Musculoskeletal: Normal range of motion. He exhibits edema and tenderness.  Lymphadenopathy:    He has no cervical adenopathy.  Neurological: He is alert and oriented to person, place, and time. He has normal reflexes. No cranial nerve deficit. He  exhibits normal muscle tone. He displays a negative Romberg sign. Coordination and gait normal.  Skin: Skin is warm and dry. No rash noted.  Psychiatric: He has a normal mood and affect. His behavior is normal. Judgment and thought content normal.  obese RLE w/trace edema, light purple - dry skin  Lab Results  Component Value Date   WBC 7.7 03/10/2017   HGB 16.3 03/10/2017   HCT 47.0 03/10/2017   PLT 277.0 03/10/2017   GLUCOSE 101 (H) 03/10/2017   CHOL 204 (H) 05/28/2017   TRIG 197.0 (H) 05/28/2017   HDL 31.80 (L) 05/28/2017   LDLDIRECT 137.0 03/10/2017   LDLCALC 133 (H) 05/28/2017   ALT 21 03/10/2017   AST 23 03/10/2017   NA 138 03/10/2017   K 4.6 03/10/2017   CL 100 03/10/2017   CREATININE 0.99 03/10/2017   BUN 16 03/10/2017   CO2 27 03/10/2017   TSH 4.23 05/28/2017   PSA 0.14 03/10/2017   INR 2.8 12/29/2017   HGBA1C 5.3 04/19/2007    Ct Angio Chest Pe W/cm &/or Wo Cm  Result Date: 07/04/2013 CLINICAL DATA:  Abdominal pain EXAM: CT ABDOMEN AND PELVIS WITH CONTRAST TECHNIQUE: Multidetector CT imaging of the abdomen and pelvis was performed using the standard protocol following bolus administration of intravenous contrast. CONTRAST:  189mL OMNIPAQUE IOHEXOL 350 MG/ML SOLN COMPARISON:  Prior CT from 11/20/2010. FINDINGS: CT CHEST: No pathologically enlarged mediastinal, hilar, or axillary lymph nodes are identified. The aortic great vessels demonstrate a normal contrast enhanced appearance. Heart size is within normal limits. Three-vessel coronary artery calcifications are present. No pericardial effusion. Pulmonary arterial tree is well opacified. No filling defects are seen to suggest acute pulmonary embolism. Re-formatted imaging confirms these findings. The lungs are clear without focal infiltrate, pulmonary edema, or pleural effusion. Mild dependent atelectasis is seen within the visualized lung bases. An 8 mm subpleural nodule is present within the posterior right lower lobe  (series 7, image 50), indeterminate. No other pulmonary nodules or masses identified. CT ABDOMEN AND PELVIS: Subcentimeter hypodensities present within the left hepatic lobe are present, stable as compared to prior study, and most likely reflect benign hepatic cysts. Additional irregular hypodense lesion measuring 1.9 cm is present within the caudate lobe of the liver is also consistent with a benign cyst. The liver is otherwise unremarkable. Gallbladder is surgically absent. No biliary ductal dilatation. The spleen, adrenal glands, and pancreas demonstrate a normal contrast enhanced appearance. Bilateral renal cysts are present, the largest of which is seen extending from the upper pole of the left kidney and measures 6.3 x 5.3 cm. No nephrolithiasis, hydronephrosis, or focal enhancing renal mass. No evidence of bowel obstruction. No abnormal mucosal enhancement, wall thickening, or inflammatory fat stranding seen about the bowels. Surgical clips within the right lower quadrant are most consistent with prior appendectomy. Bladder is unremarkable.  Prostate is within normal limits. No free air or fluid identified. No pathologically enlarged intra-abdominal pelvic lymph nodes. Study is not optimized for evaluation of the venous structures, however, no definite thrombus  seen within the IVC or bilateral iliac veins. Spinal fixation noted at the L5-S1 level. Grade 1 anterolisthesis of L4 on L5 this present. Multilevel degenerative changes present within the lower thoracic and lumbar spine. No acute osseous abnormality. No focal lytic or blastic osseous lesions. IMPRESSION: CT CHEST: 1. No CT evidence of acute pulmonary embolism. No other acute abnormality identified within the thorax. 2. 8 mm subpleural right lower lobe nodule, indeterminate, but stable as compared to prior CT from 11/20/2010. 3. Coronary artery calcifications. CT ABDOMEN AND PELVIS: 1. No acute intra-abdominal or pelvic process identified. No definite  occlusive or nonocclusive thrombus identified within the IVC or iliac vessels. However, please note that this study is not optimized for evaluation of the venous structures due to timing of the contrast bolus. Correlation with lower extremity Doppler ultrasound could be performed as clinically indicated. Electronically Signed   By: Jeannine Boga M.D.   On: 07/04/2013 16:35   Ct Abdomen Pelvis W Contrast  Result Date: 07/04/2013 CLINICAL DATA:  Abdominal pain EXAM: CT ABDOMEN AND PELVIS WITH CONTRAST TECHNIQUE: Multidetector CT imaging of the abdomen and pelvis was performed using the standard protocol following bolus administration of intravenous contrast. CONTRAST:  114mL OMNIPAQUE IOHEXOL 350 MG/ML SOLN COMPARISON:  Prior CT from 11/20/2010. FINDINGS: CT CHEST: No pathologically enlarged mediastinal, hilar, or axillary lymph nodes are identified. The aortic great vessels demonstrate a normal contrast enhanced appearance. Heart size is within normal limits. Three-vessel coronary artery calcifications are present. No pericardial effusion. Pulmonary arterial tree is well opacified. No filling defects are seen to suggest acute pulmonary embolism. Re-formatted imaging confirms these findings. The lungs are clear without focal infiltrate, pulmonary edema, or pleural effusion. Mild dependent atelectasis is seen within the visualized lung bases. An 8 mm subpleural nodule is present within the posterior right lower lobe (series 7, image 50), indeterminate. No other pulmonary nodules or masses identified. CT ABDOMEN AND PELVIS: Subcentimeter hypodensities present within the left hepatic lobe are present, stable as compared to prior study, and most likely reflect benign hepatic cysts. Additional irregular hypodense lesion measuring 1.9 cm is present within the caudate lobe of the liver is also consistent with a benign cyst. The liver is otherwise unremarkable. Gallbladder is surgically absent. No biliary ductal  dilatation. The spleen, adrenal glands, and pancreas demonstrate a normal contrast enhanced appearance. Bilateral renal cysts are present, the largest of which is seen extending from the upper pole of the left kidney and measures 6.3 x 5.3 cm. No nephrolithiasis, hydronephrosis, or focal enhancing renal mass. No evidence of bowel obstruction. No abnormal mucosal enhancement, wall thickening, or inflammatory fat stranding seen about the bowels. Surgical clips within the right lower quadrant are most consistent with prior appendectomy. Bladder is unremarkable.  Prostate is within normal limits. No free air or fluid identified. No pathologically enlarged intra-abdominal pelvic lymph nodes. Study is not optimized for evaluation of the venous structures, however, no definite thrombus seen within the IVC or bilateral iliac veins. Spinal fixation noted at the L5-S1 level. Grade 1 anterolisthesis of L4 on L5 this present. Multilevel degenerative changes present within the lower thoracic and lumbar spine. No acute osseous abnormality. No focal lytic or blastic osseous lesions. IMPRESSION: CT CHEST: 1. No CT evidence of acute pulmonary embolism. No other acute abnormality identified within the thorax. 2. 8 mm subpleural right lower lobe nodule, indeterminate, but stable as compared to prior CT from 11/20/2010. 3. Coronary artery calcifications. CT ABDOMEN AND PELVIS: 1. No acute intra-abdominal or  pelvic process identified. No definite occlusive or nonocclusive thrombus identified within the IVC or iliac vessels. However, please note that this study is not optimized for evaluation of the venous structures due to timing of the contrast bolus. Correlation with lower extremity Doppler ultrasound could be performed as clinically indicated. Electronically Signed   By: Jeannine Boga M.D.   On: 07/04/2013 16:35    Assessment & Plan:   There are no diagnoses linked to this encounter.   No orders of the defined types  were placed in this encounter.    Follow-up: No follow-ups on file.  Walker Kehr, MD

## 2018-02-05 NOTE — Assessment & Plan Note (Signed)
Coumadin 

## 2018-02-09 ENCOUNTER — Ambulatory Visit: Payer: MEDICARE

## 2018-02-18 DIAGNOSIS — M5136 Other intervertebral disc degeneration, lumbar region: Secondary | ICD-10-CM | POA: Diagnosis not present

## 2018-02-18 DIAGNOSIS — M545 Low back pain: Secondary | ICD-10-CM | POA: Diagnosis not present

## 2018-02-18 DIAGNOSIS — Z86711 Personal history of pulmonary embolism: Secondary | ICD-10-CM | POA: Diagnosis not present

## 2018-02-24 ENCOUNTER — Other Ambulatory Visit: Payer: Self-pay | Admitting: Internal Medicine

## 2018-03-26 ENCOUNTER — Ambulatory Visit (INDEPENDENT_AMBULATORY_CARE_PROVIDER_SITE_OTHER): Payer: MEDICARE | Admitting: General Practice

## 2018-03-26 DIAGNOSIS — Z86711 Personal history of pulmonary embolism: Secondary | ICD-10-CM

## 2018-03-26 DIAGNOSIS — Z7901 Long term (current) use of anticoagulants: Secondary | ICD-10-CM | POA: Diagnosis not present

## 2018-03-26 DIAGNOSIS — Z23 Encounter for immunization: Secondary | ICD-10-CM

## 2018-03-26 DIAGNOSIS — I824Z9 Acute embolism and thrombosis of unspecified deep veins of unspecified distal lower extremity: Secondary | ICD-10-CM

## 2018-03-26 LAB — POCT INR: INR: 3.5 — AB (ref 2.0–3.0)

## 2018-03-26 NOTE — Progress Notes (Signed)
Agree with management.  Maor Meckel J Heiley Shaikh, MD  

## 2018-03-26 NOTE — Patient Instructions (Addendum)
Pre visit review using our clinic review tool, if applicable. No additional management support is needed unless otherwise documented below in the visit note.  Hold coumadin today and then continue to take 6 mg all days except 3 mg on Mondays and Fridays.  Re-check in 4 weeks.

## 2018-03-29 DIAGNOSIS — L03116 Cellulitis of left lower limb: Secondary | ICD-10-CM | POA: Diagnosis not present

## 2018-03-29 DIAGNOSIS — Z7901 Long term (current) use of anticoagulants: Secondary | ICD-10-CM | POA: Diagnosis not present

## 2018-03-29 DIAGNOSIS — M79662 Pain in left lower leg: Secondary | ICD-10-CM | POA: Diagnosis not present

## 2018-03-29 DIAGNOSIS — R2242 Localized swelling, mass and lump, left lower limb: Secondary | ICD-10-CM | POA: Diagnosis not present

## 2018-03-29 DIAGNOSIS — Z882 Allergy status to sulfonamides status: Secondary | ICD-10-CM | POA: Diagnosis not present

## 2018-03-29 DIAGNOSIS — Z880 Allergy status to penicillin: Secondary | ICD-10-CM | POA: Diagnosis not present

## 2018-03-29 DIAGNOSIS — Z79899 Other long term (current) drug therapy: Secondary | ICD-10-CM | POA: Diagnosis not present

## 2018-03-29 DIAGNOSIS — Z86718 Personal history of other venous thrombosis and embolism: Secondary | ICD-10-CM | POA: Diagnosis not present

## 2018-03-30 DIAGNOSIS — L03116 Cellulitis of left lower limb: Secondary | ICD-10-CM | POA: Diagnosis not present

## 2018-04-12 ENCOUNTER — Ambulatory Visit: Payer: MEDICARE | Admitting: Internal Medicine

## 2018-04-12 ENCOUNTER — Ambulatory Visit (INDEPENDENT_AMBULATORY_CARE_PROVIDER_SITE_OTHER): Payer: MEDICARE | Admitting: Internal Medicine

## 2018-04-12 ENCOUNTER — Encounter: Payer: Self-pay | Admitting: Internal Medicine

## 2018-04-12 VITALS — BP 120/76 | HR 73 | Temp 97.1°F | Ht 71.0 in | Wt 302.0 lb

## 2018-04-12 DIAGNOSIS — M545 Low back pain, unspecified: Secondary | ICD-10-CM

## 2018-04-12 DIAGNOSIS — L03116 Cellulitis of left lower limb: Secondary | ICD-10-CM

## 2018-04-12 DIAGNOSIS — E538 Deficiency of other specified B group vitamins: Secondary | ICD-10-CM

## 2018-04-12 DIAGNOSIS — E785 Hyperlipidemia, unspecified: Secondary | ICD-10-CM

## 2018-04-12 DIAGNOSIS — I471 Supraventricular tachycardia: Secondary | ICD-10-CM

## 2018-04-12 DIAGNOSIS — D649 Anemia, unspecified: Secondary | ICD-10-CM

## 2018-04-12 DIAGNOSIS — G8929 Other chronic pain: Secondary | ICD-10-CM | POA: Diagnosis not present

## 2018-04-12 DIAGNOSIS — I824Z9 Acute embolism and thrombosis of unspecified deep veins of unspecified distal lower extremity: Secondary | ICD-10-CM | POA: Diagnosis not present

## 2018-04-12 DIAGNOSIS — Z7901 Long term (current) use of anticoagulants: Secondary | ICD-10-CM | POA: Diagnosis not present

## 2018-04-12 DIAGNOSIS — J309 Allergic rhinitis, unspecified: Secondary | ICD-10-CM

## 2018-04-12 DIAGNOSIS — L03119 Cellulitis of unspecified part of limb: Secondary | ICD-10-CM | POA: Insufficient documentation

## 2018-04-12 MED ORDER — FLUTICASONE PROPIONATE 50 MCG/ACT NA SUSP: 2.0000 | Freq: Every day | NASAL | 6 refills | Status: DC

## 2018-04-12 MED ORDER — FEXOFENADINE HCL 180 MG PO TABS: 180.0000 mg | ORAL_TABLET | Freq: Every day | ORAL | 3 refills | Status: DC | PRN

## 2018-04-12 NOTE — Assessment & Plan Note (Signed)
CBC

## 2018-04-12 NOTE — Assessment & Plan Note (Signed)
Loose wt - discussed Coumadin

## 2018-04-12 NOTE — Assessment & Plan Note (Signed)
Declined statins. 

## 2018-04-12 NOTE — Assessment & Plan Note (Signed)
Rx w/dalbavancin 1,500 mg IntraVENous ONCE

## 2018-04-12 NOTE — Assessment & Plan Note (Signed)
Allegra, Flonase

## 2018-04-12 NOTE — Assessment & Plan Note (Signed)
F/u w/Dr Nishan 

## 2018-04-12 NOTE — Assessment & Plan Note (Signed)
Chronic  Potential benefits of a long term opioids use as well as potential risks (i.e. addiction risk, apnea etc) and complications (i.e. Somnolence, constipation and others) were explained to the patient and were aknowledged.  on OxyIR now

## 2018-04-12 NOTE — Assessment & Plan Note (Signed)
On B12 

## 2018-04-12 NOTE — Assessment & Plan Note (Signed)
Coumadin 

## 2018-04-12 NOTE — Progress Notes (Signed)
Subjective:  Patient ID: Robert LIGHTSEY, male    DOB: 06/22/1951  Age: 67 y.o. MRN: [id number]  CC: No chief complaint on file.   HPI Robert [name] presents for chronic pain, recent L lower leg cellulitis 10/19 - he had IV Abx x1 (vancomycin) and on 10/8 he had Dolbavacyn IV. F/u DVT, LBP  Per [facility]:    "Assessment/Plan 03/29/18:   Left leg cellulitis- patient presents after ED visit last night when he was treated with Vancomycin. 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 He is concerned that this cellulitis might affect his prosthesis. He reports current nausea which he feels was caused by Vancomycin but has not vomited. We will give Zofran IV. Patient did feel improved after this. He did report sudden episode of nausea when receiving Dalvance but this resolved on its own when infusion was slowed. Following this patient tolerated infusion well. He was noted to have rash on both lower extremities with small erythematous spots prior to having infusion. His wife reports he had this prior to ED visit. Patient also noted some scattered spots on his abdomen and torso. He denies any itching and denies exposure to poison ivy. He was counseled to use prn Benadryl if this worsens becomes itchy.  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 This initially appeared to be PAC's with occasional PVC but analysis did show 2nd degree AV block, Mobitz 1. Patient denied any chest pain or SOB. He has noted some palpitations at times but not recently. Per review records patient did have episode of SVT with his knee replacement in 2015 and his wife reports he required more than one cardioversion with his knee revision in 2019. We did consult cardiology who recommended heart monitor  and will contact patient to arrange this.  "  Outpatient Medications Prior to Visit  Medication Sig Dispense Refill  . albuterol (PROVENTIL) (2.5 MG/3ML) 0.083% nebulizer solution Take 3 mLs (2.5 mg total) by nebulization every 6 (six) hours as needed for wheezing or shortness of breath. Dx  J45.909 150 mL 3  . Cyanocobalamin (VITAMIN B-12) 5000 MCG SUBL Place 5,000 tablets under the tongue every morning.    . fish oil-omega-3 fatty acids 1000 MG capsule Take 2 g by mouth every morning.    . furosemide (LASIX) 20 MG tablet Take 1 tablet (20 mg total) by mouth daily. 90 tablet 3  . glucosamine-chondroitin 500-400 MG tablet Take 1 tablet by mouth every morning.    [name] levocetirizine (XYZAL) 5 MG tablet Take 1 tablet (5 mg total) by mouth every evening.    . nortriptyline (PAMELOR) 10 MG capsule Take 1 capsule (10 mg total) by mouth every evening. 90 capsule 3  . PROAIR HFA 108 (90 Base) MCG/ACT inhaler INHALE 2 PUFFS EVERY 6 HOURS AS NEEDED FOR WHEEZING/SHORTNESS OF BREATH 25.5 Inhaler 2  . triamcinolone cream (KENALOG) 0.5 % Apply 1 application topically 3 (three) times daily. 120 g 1  . warfarin (COUMADIN) 3 MG tablet TAKE AS DIRECTED 32 tablet 1  . warfarin (COUMADIN) 6 MG tablet USE AS DIRECTED 90 tablet 3   No facility-administered medications prior to visit.     ROS: Review of Systems  Constitutional: Negative for appetite change, fatigue and unexpected weight change.  HENT:  Negative for congestion, nosebleeds, sneezing, sore throat and trouble swallowing.   Eyes: Negative for itching and visual disturbance.  Respiratory: Negative for cough.   Cardiovascular: Positive for leg swelling. Negative for chest pain and palpitations.  Gastrointestinal: Negative for abdominal distention, blood in stool, diarrhea and nausea.  Genitourinary: Negative for frequency and hematuria.  Musculoskeletal: Positive for arthralgias, back pain and gait problem. Negative for joint swelling and neck pain.    Skin: Negative for rash.  Neurological: Negative for dizziness, tremors, speech difficulty and weakness.  Psychiatric/Behavioral: Negative for agitation, dysphoric mood and sleep disturbance. The patient is not nervous/anxious.     Objective:  BP 120/76 (BP Location: Left Arm, Patient Position: Sitting, Cuff Size: Large)   Pulse 73   Temp (!) 97.1 F (36.2 C) (Oral)   Ht 5\' 11"  (1.803 m)   Wt (!) 302 lb (137 kg)   SpO2 97%   BMI 42.12 kg/m   BP Readings from Last 3 Encounters:  04/12/18 120/76  02/05/18 124/76  03/04/17 136/82    Wt Readings from Last 3 Encounters:  04/12/18 (!) 302 lb (137 kg)  02/05/18 (!) 305 lb (138.3 kg)  03/04/17 294 lb (133.4 kg)    Physical Exam  Constitutional: He is oriented to person, place, and time. He appears well-developed. No distress.  NAD  HENT:  Mouth/Throat: Oropharynx is clear and moist.  Eyes: Pupils are equal, round, and reactive to light. Conjunctivae are normal.  Neck: Normal range of motion. No JVD present. No thyromegaly present.  Cardiovascular: Normal rate, regular rhythm and intact distal pulses. Exam reveals no gallop and no friction rub.  Murmur heard. Pulmonary/Chest: Effort normal and breath sounds normal. No respiratory distress. He has no wheezes. He has no rales. He exhibits no tenderness.  Abdominal: Soft. Bowel sounds are normal. He exhibits no distension and no mass. There is no tenderness. There is no rebound and no guarding.  Musculoskeletal: Normal range of motion. He exhibits edema and tenderness.  Lymphadenopathy:    He has no cervical adenopathy.  Neurological: He is alert and oriented to person, place, and time. He has normal reflexes. No cranial nerve deficit. He exhibits normal muscle tone. He displays a negative Romberg sign. Coordination and gait normal.  Skin: Skin is warm and dry. No rash noted.  Psychiatric: He has a normal mood and affect. His behavior is normal. Judgment and thought content normal.   obese LLE w/trace edema and hyperpigmentation  Lab Results  Component Value Date   WBC 7.2 02/05/2018   HGB 16.0 02/05/2018   HCT 46.9 02/05/2018   PLT 275.0 02/05/2018   GLUCOSE 120 (H) 02/05/2018   CHOL 217 (H) 02/05/2018   TRIG 181.0 (H) 02/05/2018   HDL 29.90 (L) 02/05/2018   LDLDIRECT 137.0 03/10/2017   LDLCALC 151 (H) 02/05/2018   ALT 21 02/05/2018   AST 22 02/05/2018   NA 137 02/05/2018   K 4.3 02/05/2018   CL 102 02/05/2018   CREATININE 1.07 02/05/2018   BUN 16 02/05/2018   CO2 29 02/05/2018   TSH 3.37 02/05/2018   PSA 0.14 03/10/2017   INR 3.5 (A) 03/26/2018   HGBA1C 5.3 04/19/2007    Ct Angio Chest Pe W/cm &/or Wo Cm  Result Date: 07/04/2013 CLINICAL DATA:  Abdominal pain EXAM: CT ABDOMEN AND PELVIS WITH CONTRAST TECHNIQUE: Multidetector CT imaging of the abdomen and pelvis was performed using the standard protocol following bolus administration of intravenous contrast. CONTRAST:  116mL OMNIPAQUE IOHEXOL 350 MG/ML  SOLN COMPARISON:  Prior CT from 11/20/2010. FINDINGS: CT CHEST: No pathologically enlarged mediastinal, hilar, or axillary lymph nodes are identified. The aortic great vessels demonstrate a normal contrast enhanced appearance. Heart size is within normal limits. Three-vessel coronary artery calcifications are present. No pericardial effusion. Pulmonary arterial tree is well opacified. No filling defects are seen to suggest acute pulmonary embolism. Re-formatted imaging confirms these findings. The lungs are clear without focal infiltrate, pulmonary edema, or pleural effusion. Mild dependent atelectasis is seen within the visualized lung bases. An 8 mm subpleural nodule is present within the posterior right lower lobe (series 7, image 50), indeterminate. No other pulmonary nodules or masses identified. CT ABDOMEN AND PELVIS: Subcentimeter hypodensities present within the left hepatic lobe are present, stable as compared to prior study, and most likely reflect  benign hepatic cysts. Additional irregular hypodense lesion measuring 1.9 cm is present within the caudate lobe of the liver is also consistent with a benign cyst. The liver is otherwise unremarkable. Gallbladder is surgically absent. No biliary ductal dilatation. The spleen, adrenal glands, and pancreas demonstrate a normal contrast enhanced appearance. Bilateral renal cysts are present, the largest of which is seen extending from the upper pole of the left kidney and measures 6.3 x 5.3 cm. No nephrolithiasis, hydronephrosis, or focal enhancing renal mass. No evidence of bowel obstruction. No abnormal mucosal enhancement, wall thickening, or inflammatory fat stranding seen about the bowels. Surgical clips within the right lower quadrant are most consistent with prior appendectomy. Bladder is unremarkable.  Prostate is within normal limits. No free air or fluid identified. No pathologically enlarged intra-abdominal pelvic lymph nodes. Study is not optimized for evaluation of the venous structures, however, no definite thrombus seen within the IVC or bilateral iliac veins. Spinal fixation noted at the L5-S1 level. Grade 1 anterolisthesis of L4 on L5 this present. Multilevel degenerative changes present within the lower thoracic and lumbar spine. No acute osseous abnormality. No focal lytic or blastic osseous lesions. IMPRESSION: CT CHEST: 1. No CT evidence of acute pulmonary embolism. No other acute abnormality identified within the thorax. 2. 8 mm subpleural right lower lobe nodule, indeterminate, but stable as compared to prior CT from 11/20/2010. 3. Coronary artery calcifications. CT ABDOMEN AND PELVIS: 1. No acute intra-abdominal or pelvic process identified. No definite occlusive or nonocclusive thrombus identified within the IVC or iliac vessels. However, please note that this study is not optimized for evaluation of the venous structures due to timing of the contrast bolus. Correlation with lower extremity  Doppler ultrasound could be performed as clinically indicated. Electronically Signed   By: Jeannine Boga M.D.   On: 07/04/2013 16:35   Ct Abdomen Pelvis W Contrast  Result Date: 07/04/2013 CLINICAL DATA:  Abdominal pain EXAM: CT ABDOMEN AND PELVIS WITH CONTRAST TECHNIQUE: Multidetector CT imaging of the abdomen and pelvis was performed using the standard protocol following bolus administration of intravenous contrast. CONTRAST:  175mL OMNIPAQUE IOHEXOL 350 MG/ML SOLN COMPARISON:  Prior CT from 11/20/2010. FINDINGS: CT CHEST: No pathologically enlarged mediastinal, hilar, or axillary lymph nodes are identified. The aortic great vessels demonstrate a normal contrast enhanced appearance. Heart size is within normal limits. Three-vessel coronary artery calcifications are present. No pericardial effusion. Pulmonary arterial tree is well opacified. No filling defects are seen to suggest acute pulmonary embolism. Re-formatted imaging confirms these findings. The lungs are clear without focal infiltrate, pulmonary edema, or pleural effusion. Mild dependent atelectasis is seen within the visualized lung bases. An 8 mm subpleural nodule is present  within the posterior right lower lobe (series 7, image 50), indeterminate. No other pulmonary nodules or masses identified. CT ABDOMEN AND PELVIS: Subcentimeter hypodensities present within the left hepatic lobe are present, stable as compared to prior study, and most likely reflect benign hepatic cysts. Additional irregular hypodense lesion measuring 1.9 cm is present within the caudate lobe of the liver is also consistent with a benign cyst. The liver is otherwise unremarkable. Gallbladder is surgically absent. No biliary ductal dilatation. The spleen, adrenal glands, and pancreas demonstrate a normal contrast enhanced appearance. Bilateral renal cysts are present, the largest of which is seen extending from the upper pole of the left kidney and measures 6.3 x 5.3 cm.  No nephrolithiasis, hydronephrosis, or focal enhancing renal mass. No evidence of bowel obstruction. No abnormal mucosal enhancement, wall thickening, or inflammatory fat stranding seen about the bowels. Surgical clips within the right lower quadrant are most consistent with prior appendectomy. Bladder is unremarkable.  Prostate is within normal limits. No free air or fluid identified. No pathologically enlarged intra-abdominal pelvic lymph nodes. Study is not optimized for evaluation of the venous structures, however, no definite thrombus seen within the IVC or bilateral iliac veins. Spinal fixation noted at the L5-S1 level. Grade 1 anterolisthesis of L4 on L5 this present. Multilevel degenerative changes present within the lower thoracic and lumbar spine. No acute osseous abnormality. No focal lytic or blastic osseous lesions. IMPRESSION: CT CHEST: 1. No CT evidence of acute pulmonary embolism. No other acute abnormality identified within the thorax. 2. 8 mm subpleural right lower lobe nodule, indeterminate, but stable as compared to prior CT from 11/20/2010. 3. Coronary artery calcifications. CT ABDOMEN AND PELVIS: 1. No acute intra-abdominal or pelvic process identified. No definite occlusive or nonocclusive thrombus identified within the IVC or iliac vessels. However, please note that this study is not optimized for evaluation of the venous structures due to timing of the contrast bolus. Correlation with lower extremity Doppler ultrasound could be performed as clinically indicated. Electronically Signed   By: Jeannine Boga M.D.   On: 07/04/2013 16:35    Assessment & Plan:   There are no diagnoses linked to this encounter.   No orders of the defined types were placed in this encounter.    Follow-up: No follow-ups on file.  Walker Kehr, MD

## 2018-04-23 ENCOUNTER — Ambulatory Visit (INDEPENDENT_AMBULATORY_CARE_PROVIDER_SITE_OTHER): Payer: MEDICARE | Admitting: Cardiology

## 2018-04-23 ENCOUNTER — Ambulatory Visit: Payer: MEDICARE

## 2018-04-23 ENCOUNTER — Encounter: Payer: Self-pay | Admitting: Cardiology

## 2018-04-23 VITALS — BP 128/76 | HR 82 | Ht 71.0 in | Wt 303.0 lb

## 2018-04-23 DIAGNOSIS — Z7189 Other specified counseling: Secondary | ICD-10-CM

## 2018-04-23 DIAGNOSIS — Z86718 Personal history of other venous thrombosis and embolism: Secondary | ICD-10-CM

## 2018-04-23 DIAGNOSIS — R011 Cardiac murmur, unspecified: Secondary | ICD-10-CM | POA: Diagnosis not present

## 2018-04-23 DIAGNOSIS — I471 Supraventricular tachycardia: Secondary | ICD-10-CM | POA: Diagnosis not present

## 2018-04-23 DIAGNOSIS — I872 Venous insufficiency (chronic) (peripheral): Secondary | ICD-10-CM | POA: Diagnosis not present

## 2018-04-23 DIAGNOSIS — E785 Hyperlipidemia, unspecified: Secondary | ICD-10-CM | POA: Diagnosis not present

## 2018-04-23 DIAGNOSIS — Z7901 Long term (current) use of anticoagulants: Secondary | ICD-10-CM

## 2018-04-23 DIAGNOSIS — I824Z9 Acute embolism and thrombosis of unspecified deep veins of unspecified distal lower extremity: Secondary | ICD-10-CM

## 2018-04-23 DIAGNOSIS — R002 Palpitations: Secondary | ICD-10-CM | POA: Diagnosis not present

## 2018-04-23 NOTE — Patient Instructions (Signed)
Medication Instructions:  Your Physician recommend you continue on your current medication as directed.    If you need a refill on your cardiac medications before your next appointment, please call your pharmacy.   Lab work: None   Testing/Procedures: Your physician has requested that you have an echocardiogram. Echocardiography is a painless test that uses sound waves to create images of your heart. It provides your doctor with information about the size and shape of your heart and how well your heart's chambers and valves are working. This procedure takes approximately one hour. There are no restrictions for this procedure. [facility]  Our physician has recommended that you wear an 14 DAY ZIO-PATCH monitor. The Zio patch cardiac monitor continuously records heart rhythm data for up to 14 days, this is for patients being evaluated for multiple types heart rhythms. For the first 24 hours post application, please avoid getting the Zio monitor wet in the shower or by excessive sweating during exercise. After that, feel free to carry on with regular activities. Keep soaps and lotions away from the ZIO XT Patch.   [place] [address]       Follow-Up: Your physician recommends that you schedule a follow-up appointment in 2 months with Dr. Harrell Gave.   Any Other Special Instructions Will Be Listed Below (If Applicable).

## 2018-04-23 NOTE — Progress Notes (Signed)
Cardiology Office Note:    Date:  04/23/2018   ID:  Robert [name], DOB 1950-08-31, MRN [id number]  PCP:  Cassandria Anger, MD  Cardiologist:  Buford Dresser, MD PhD (previously seen by Dr. Johnsie Cancel)  Referring MD: Alain Marion [name], MD   CC: post hospitalization follow up  History of Present Illness:    Robert [name] is a 67 y.o. male with a hx of prior DVT/PE, dyslipidemia, obesity, paroxysmal SVT who is seen as a new consult at the request of [name], [name], MD for the evaluation and management of arrhythmia. He was last seen by cardiology in 04/2014 by Dr. Johnsie Cancel.  He was recently seen by Dr. Alain Marion on 04/12/18 for f/u of left leg cellulitis. During a recent hospitalization for this, there are notes mentioning PACs/PVCs with possible second degree AV block, Mobitz I. He was recommended for a heart monitor.   He has not had syncope. He occasionally was nauseated/lightheaded/changed breathing pattern, but he attributed this to the cellulitis. No frank chest pain, and now breathing improved. Every night, has episodes where he feels like his heart is jumping out of his chest. He sits down at the end of the day and starts to have palpitations and bradycardia. Lasts 30-60 minutes every night. Similar to how his son's heartbeat was before he passed away with the hard heartbeats. Mother had 5V CABG about 10 years ago, had to have a pacemaker placed.   Blood pressure has been well controlled except for during recent hospital visit for cellulitis. Has chronic RLE edema and wears compression stocking. LLE cellulitis improved per patient.  Past Medical History:  Diagnosis Date  . Anxiety   . Asthma    "no attack since acupuncture in 1990's"  . Complication of anesthesia    SLOW TO WAKE UP / DIFFICULTY RESPONDING 2003, 3 days before could use left arm, "wild/crazy sometimes"  . Cyst of left kidney    "benign"  . DVT (deep venous thrombosis) ([place]) 2003   right  femoral artery "from groin to knee"  . Edema    Right Leg w/ h/o DVT postphlebitic  . H/O blood transfusion reaction 2004   FFP, extreme swelling, could not breath  . Headache(784.0)    MIGRAINES-not for a long time  . History of pulmonary embolism 2003   both lungs  . Hyperlipidemia   . Knee pain    left  . LBP (low back pain)   . 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 Obesity   . Osteoarthritis   . Peripheral vascular disease ([place])    "poor circulation in  RT leg"  . PONV (postoperative nausea and vomiting)    during surgery in 1990's  . Spondylolisthesis   . Warfarin anticoagulation   . Wheezing    when laying on left side    Past Surgical History:  Procedure Laterality Date  . APPENDECTOMY  1993   Dr Margot Chimes  . CHOLECYSTECTOMY  1993  . FINGER SURGERY  2012   RT THUMB RECONSTRUCTION  . HAND SURGERY Right    abcess  . HEMORROIDECTOMY  1981  . I&D KNEE WITH POLY EXCHANGE Left 04/04/2013   Procedure: IRRIGATION AND DEBRIDEMENT KNEE WITH POLY EXCHANGE AND INSERTION OF CEMENT BEADS;  Surgeon: Gearlean Alf, MD;  Location: [facility];  Service: Orthopedics;  Laterality: Left;  . KNEE ARTHROSCOPY  1990   RT KNEE  . KNEE ARTHROSCOPY Right 2003  . KNEE ARTHROSCOPY  [place] ARTHROSCOPY WITH MENISCAL REPAIR Left 10/22/2012   Procedure: LEFT KNEE ARTHROSCOPY PARTIAL MEDIAL AND LATERAL MENISCECTOMY AND DEBRIDEMENT, CHONDROPLASTY ;  Surgeon: Johnn Hai, MD;  Location: [facility];  Service: Orthopedics;  Laterality: Left;  . LUMBAR DISC SURGERY    . LUMBAR FUSION  2003   Dr Maxie Better  . ORCHIECTOMY  1994   R post injury  . TONSILLECTOMY  1967  . TOTAL KNEE ARTHROPLASTY Left 03/21/2013   Procedure: LEFT TOTAL KNEE ARTHROPLASTY;  Surgeon: Gearlean Alf, MD;  Location: [facility];  Service: Orthopedics;  Laterality: Left;    Current Medications: Current Outpatient Medications on File Prior to Visit  Medication Sig  . albuterol (PROVENTIL) (2.5 MG/3ML)  0.083% nebulizer solution Take 3 mLs (2.5 mg total) by nebulization every 6 (six) hours as needed for wheezing or shortness of breath. Dx  J45.909  . Cyanocobalamin (VITAMIN B-12) 5000 MCG SUBL Place 5,000 tablets under the tongue every morning.  . fexofenadine (ALLEGRA ALLERGY) 180 MG tablet Take 1 tablet (180 mg total) by mouth daily as needed for allergies or rhinitis.  . fish oil-omega-3 fatty acids 1000 MG capsule Take 2 g by mouth every morning.  . fluticasone (FLONASE) 50 MCG/ACT nasal spray Place 2 sprays into both nostrils daily.  . furosemide (LASIX) 20 MG tablet Take 1 tablet (20 mg total) by mouth daily.  [name] glucosamine-chondroitin 500-400 MG tablet Take 1 tablet by mouth every morning.  . nortriptyline (PAMELOR) 10 MG capsule Take 1 capsule (10 mg total) by mouth every evening.  [name] PROAIR HFA 108 (90 Base) MCG/ACT inhaler INHALE 2 PUFFS EVERY 6 HOURS AS NEEDED FOR WHEEZING/SHORTNESS OF BREATH  . triamcinolone cream (KENALOG) 0.5 % Apply 1 application topically 3 (three) times daily.  [name] warfarin (COUMADIN) 3 MG tablet TAKE AS DIRECTED  . warfarin (COUMADIN) 6 MG tablet USE AS DIRECTED   No current facility-administered medications on file prior to visit.      Allergies:   Sulfa antibiotics; Anoro ellipta [umeclidinium-vilanterol]; Breo ellipta [fluticasone furoate-vilanterol]; Exalgo [hydromorphone hcl]; Fentanyl; Gabapentin; Hydrocodone-acetaminophen; Lopressor [metoprolol tartrate]; Penicillins; Sulfonamide derivatives; and Tylenol [acetaminophen]   Social History   Socioeconomic History  . Marital status: Married    Spouse name: Not on file  . Number of children: 3  . Years of education: Not on file  . Highest education level: Not on file  Occupational History  . Occupation: Disabled    Employer: DISABLED  Social Needs  . Financial resource strain: Not on file  . Food insecurity:    Worry: Not on file    Inability: Not on file  . Transportation needs:    Medical: Not  on file    Non-medical: Not on file  Tobacco Use  . Smoking status: Never Smoker  . Smokeless tobacco: Never Used  Substance and Sexual Activity  . Alcohol use: No  . Drug use: No  . Sexual activity: Yes  Lifestyle  . Physical activity:    Days per week: Not on file    Minutes per session: Not on file  . Stress: Not on file  Relationships  . Social connections:    Talks on phone: Not on file    Gets together: Not on file    Attends religious service: Not on file    Active member of club or organization: Not on file    Attends meetings of clubs or organizations: Not on file    Relationship status:  Not on file  Other Topics Concern  . Not on file  Social History Narrative  . Not on file     Family History: The patient's family history includes Heart disease in his mother and sister; Hyperlipidemia in his other; Hypertension in his other; Parkinsonism in his other.  ROS:   Please see the history of present illness.  Additional pertinent ROS:  Constitutional: Negative for chills, fever, night sweats, unintentional weight loss  HENT: Negative for ear pain and hearing loss.   Eyes: Negative for loss of vision and eye pain.  Respiratory: Negative for cough, sputum, shortness of breath, wheezing.   Cardiovascular: Positive for palpitations, chronic LE edema on the right. Negative for chest pain, PND, orthopnea, and claudication.  Gastrointestinal: Negative for abdominal pain, melena, and hematochezia.  Genitourinary: Negative for dysuria and hematuria.  Musculoskeletal: Negative for falls and myalgias.  Skin: Negative for itching and rash. Resolving LLE cellulitis Neurological: Negative for focal weakness, focal sensory changes and loss of consciousness.  Endo/Heme/Allergies: Does not bruise/bleed easily.    EKGs/Labs/Other Studies Reviewed:    The following studies were reviewed today: Echo 04/05/13 - Left ventricle: The cavity size was normal. Wall thickness was  increased in a pattern of mild LVH. Systolic function was normal. The estimated ejection fraction was in the range of 55% to 60%. - Aortic valve: There was very mild stenosis. - Left atrium: The atrium was mildly dilated. - Atrial septum: No defect or patent foramen ovale was identified.  Echo stress 03/27/11  - Stress ECG conclusions: The stress ECG was normal. - Staged echo: There was no echocardiographic evidence for stress-induced ischemia. Impressions:  - Stress echo with chest tightness but no ST changes; no stress-induced wall motion abnormalities  EKG:  EKG is ordered today.  The ekg ordered today demonstrates sinus rhythm with sinus arrhythmia.  Recent Labs: 02/05/2018: ALT 21; BUN 16; Creatinine, Ser 1.07; Hemoglobin 16.0; Platelets 275.0; Potassium 4.3; Sodium 137; TSH 3.37  Recent Lipid Panel    Component Value Date/Time   CHOL 217 (H) 02/05/2018 0755   TRIG 181.0 (H) 02/05/2018 0755   TRIG 118 04/30/2006 0841   HDL 29.90 (L) 02/05/2018 0755   CHOLHDL 7 02/05/2018 0755   VLDL 36.2 02/05/2018 0755   LDLCALC 151 (H) 02/05/2018 0755   LDLDIRECT 137.0 03/10/2017 0842    Physical Exam:    VS:  BP 128/76 (BP Location: Right Arm, Patient Position: Sitting, Cuff Size: Normal)   Pulse 82   Ht 5\' 11"  (1.803 m)   Wt (!) 303 lb (137.4 kg)   BMI 42.26 kg/m     Wt Readings from Last 3 Encounters:  04/23/18 (!) 303 lb (137.4 kg)  04/12/18 (!) 302 lb (137 kg)  02/05/18 (!) 305 lb (138.3 kg)     GEN: Well nourished, well developed in no acute distress HEENT: Normal NECK: No JVD at 90 degrees; No carotid bruits LYMPHATICS: No lymphadenopathy CARDIAC: regular rhythm, normal S1 and S2, no rubs, gallops. 1/6 SEM at RUSB with early peak and preserved S2. Radial and DP pulses 2+ bilaterally. RESPIRATORY:  Clear to auscultation without rales, wheezing or rhonchi  ABDOMEN: Soft, non-tender, non-distended MUSCULOSKELETAL:  No deformity  SKIN: RLE with compression  stocking in place. LLE with resolving cellulitis and trace edema.  NEUROLOGIC:  Alert and oriented x 3 PSYCHIATRIC:  Normal affect   ASSESSMENT:    1. Palpitation   2. Murmur, cardiac   3. Chronic venous insufficiency   4. Paroxysmal  supraventricular tachycardia ([place])   5. Deep vein thrombosis (DVT) of distal vein of lower extremity, unspecified chronicity, unspecified laterality ([place])   6. Dyslipidemia   7. PULMONARY EMBOLISM, HX OF   8. Long term (current) use of anticoagulants   9. Counseling on health promotion and disease prevention    PLAN:    1. Palpitations, history of paroxysmal SVT, with concern for bradycardia/heart block as well: nearly nightly symptoms. History of SVT after anesthesia in the past requiring cardioversion.  -14 day Zio patch  -echocardiogram  2. History of DVT/PE with chronic venous insufficiency, on longer term anticoagulation with coumadin: happy with coumadin therapy, not interested in DOAC at this time. Uses compression stocking for insufficiency  3. Murmur: consistent with mild AS  -echo >5 years ago, recheck  4. Dyslipidemia: discussed risk at length. Declines statin therapy at this time. Did review that there are additional non-statin therapies at this time, including vascepa and ezetimibe.  5. Prevention counseling and recommendations: -recommend heart healthy/Mediterranean diet, with whole grains, fruits, vegetable, fish, lean meats, nuts, and olive oil. Limit salt. -recommend moderate walking, 3-5 times/week for 30-50 minutes each session. Aim for at least 150 minutes.week. Goal should be pace of 3 miles/hours, or walking 1.5 miles in 30 minutes -recommend avoidance of tobacco products. Avoid excess alcohol. -Additional risk factor control:  -Diabetes: A1c is from 2008, was 5.3.  -Lipids: as above  -Blood pressure control: at goal  -Weight: BMI 42. Lifestyle goals above -ASCVD risk score: The 10-year ASCVD risk score Mikey Bussing DC Brooke Bonito., et al.,  2013) is: 18.9%   Values used to calculate the score:     Age: 68 years     Sex: Male     Is Non-Hispanic African American: No     Diabetic: No     Tobacco smoker: No     Systolic Blood Pressure: 761 mmHg     Is BP treated: No     HDL Cholesterol: 29.9 mg/dL     Total Cholesterol: 217 mg/dL   Plan for follow up:  Medication Adjustments/Labs and Tests Ordered: Current medicines are reviewed at length with the patient today.  Concerns regarding medicines are outlined above.  Orders Placed This Encounter  Procedures  . LONG TERM MONITOR (3-14 DAYS)  . EKG 12-Lead  . ECHOCARDIOGRAM COMPLETE   No orders of the defined types were placed in this encounter.   Patient Instructions  Medication Instructions:  Your Physician recommend you continue on your current medication as directed.    If you need a refill on your cardiac medications before your next appointment, please call your pharmacy.   Lab work: None   Testing/Procedures: Your physician has requested that you have an echocardiogram. Echocardiography is a painless test that uses sound waves to create images of your heart. It provides your doctor with information about the size and shape of your heart and how well your heart's chambers and valves are working. This procedure takes approximately one hour. There are no restrictions for this procedure. [facility]  Our physician has recommended that you wear an 14 DAY ZIO-PATCH monitor. The Zio patch cardiac monitor continuously records heart rhythm data for up to 14 days, this is for patients being evaluated for multiple types heart rhythms. For the first 24 hours post application, please avoid getting the Zio monitor wet in the shower or by excessive sweating during exercise. After that, feel free to carry on with regular activities. Keep soaps and lotions  away from the ZIO XT Patch.   [place] [address]       Follow-Up: Your physician  recommends that you schedule a follow-up appointment in 2 months with Dr. Harrell Gave.   Any Other Special Instructions Will Be Listed Below (If Applicable).       Signed, Buford Dresser, MD PhD 04/23/2018 5:06 PM    [place]

## 2018-04-30 ENCOUNTER — Ambulatory Visit (INDEPENDENT_AMBULATORY_CARE_PROVIDER_SITE_OTHER): Payer: MEDICARE | Admitting: General Practice

## 2018-04-30 DIAGNOSIS — Z7901 Long term (current) use of anticoagulants: Secondary | ICD-10-CM

## 2018-04-30 DIAGNOSIS — Z86711 Personal history of pulmonary embolism: Secondary | ICD-10-CM

## 2018-04-30 LAB — POCT INR: INR: 2 (ref 2.0–3.0)

## 2018-04-30 NOTE — Patient Instructions (Addendum)
Pre visit review using our clinic review tool, if applicable. No additional management support is needed unless otherwise documented below in the visit note.  Continue to take 6 mg all days except 3 mg on Mondays and Fridays.  Re-check in 6 weeks. 

## 2018-05-05 ENCOUNTER — Other Ambulatory Visit (HOSPITAL_BASED_OUTPATIENT_CLINIC_OR_DEPARTMENT_OTHER): Payer: MEDICARE

## 2018-05-06 ENCOUNTER — Ambulatory Visit: Payer: MEDICARE

## 2018-05-06 ENCOUNTER — Ambulatory Visit (HOSPITAL_BASED_OUTPATIENT_CLINIC_OR_DEPARTMENT_OTHER)
Admission: RE | Admit: 2018-05-06 | Discharge: 2018-05-06 | Disposition: A | Discharge status: Home / Self Care | Payer: MEDICARE | Source: Ambulatory Visit | Attending: Cardiology | Admitting: Cardiology

## 2018-05-06 DIAGNOSIS — R002 Palpitations: Secondary | ICD-10-CM | POA: Insufficient documentation

## 2018-05-06 NOTE — Progress Notes (Signed)
  Echocardiogram 2D Echocardiogram has been performed.  Gerron Guidotti T Moni Rothrock 05/06/2018, 11:32 AM

## 2018-05-07 ENCOUNTER — Telehealth: Payer: Self-pay | Admitting: Cardiology

## 2018-05-07 NOTE — Telephone Encounter (Signed)
Follow Up:; ° ° °Returning your call. °

## 2018-05-10 NOTE — Telephone Encounter (Signed)
Left message to call back  

## 2018-05-11 NOTE — Telephone Encounter (Signed)
Pt updated with test results along with Dr. Judeth Cornfield recommendation. Pt verbalized understanding.

## 2018-05-11 NOTE — Telephone Encounter (Signed)
Patient returned your call.

## 2018-05-26 DIAGNOSIS — R002 Palpitations: Secondary | ICD-10-CM | POA: Diagnosis not present

## 2018-05-27 ENCOUNTER — Telehealth: Payer: Self-pay | Admitting: *Deleted

## 2018-05-27 NOTE — Telephone Encounter (Signed)
Pt updated with monitor report along with Dr. Judeth Cornfield recommendation. Pt verbalized understanding but states he can't take metoprolol, as he has asthma. Will route to MD for further recommendation.

## 2018-05-27 NOTE — Telephone Encounter (Signed)
We can use low dose diltiazem. I would use diltiazem 120 mg extended release daily and see if that helps. Thanks.

## 2018-05-27 NOTE — Telephone Encounter (Signed)
Pt said she he will look up the medication and call back if he decides whether or not he will take it.

## 2018-05-27 NOTE — Telephone Encounter (Signed)
Patient returned call

## 2018-05-27 NOTE — Telephone Encounter (Signed)
Thank you. Please see monitor report for my comments.

## 2018-05-27 NOTE — Telephone Encounter (Signed)
Pt's wife phoned to inquire about results of monitor. Informed her that results had just been sent to Dr. She states that pt is getting worse: Weakness in left arm onset last night; HR in the upper 30's since Oct. Wife is very concerned. Please advise. ATTENTION: Wife does not want pt to know she called [place].

## 2018-05-27 NOTE — Telephone Encounter (Signed)
Spoke with pts wife who states she feel pt is doing worse. She reports pt has been having c/o of weakness in left arm and chest pain off and on. Wife states his heart rate has been all over the place. It will go from beating 38-40 bpm to very fast. She states when pt walks across the floor he gets very exhausted. Wife advised to take pt to ER for further evaluations. Wife states pt will not go to the ED as she has tried several times. Offered for pt to bee seen in office today by PA and wife voiced pt will not see a PA as he's had a bad experience.  Spoke with pt who states he is very tired but not at his worst. Tried to encourage pt to go to ED or come in today for OV. Pt declined and states he will wait for Dr. Harrell Gave to review monitor report. Will route to MD to make aware.

## 2018-05-27 NOTE — Telephone Encounter (Signed)
Left message to call back  

## 2018-05-31 ENCOUNTER — Other Ambulatory Visit: Payer: Self-pay | Admitting: Internal Medicine

## 2018-06-09 NOTE — Telephone Encounter (Signed)
Here ya go!  

## 2018-06-11 ENCOUNTER — Ambulatory Visit (INDEPENDENT_AMBULATORY_CARE_PROVIDER_SITE_OTHER): Payer: MEDICARE | Admitting: General Practice

## 2018-06-11 DIAGNOSIS — Z7901 Long term (current) use of anticoagulants: Secondary | ICD-10-CM

## 2018-06-11 DIAGNOSIS — Z86711 Personal history of pulmonary embolism: Secondary | ICD-10-CM

## 2018-06-11 LAB — POCT INR: INR: 2.9 (ref 2.0–3.0)

## 2018-06-11 NOTE — Patient Instructions (Addendum)
Pre visit review using our clinic review tool, if applicable. No additional management support is needed unless otherwise documented below in the visit note.  Continue to take 6 mg all days except 3 mg on Mondays and Fridays.  Re-check in 6 weeks. 

## 2018-06-28 ENCOUNTER — Ambulatory Visit (INDEPENDENT_AMBULATORY_CARE_PROVIDER_SITE_OTHER): Payer: MEDICARE | Admitting: Cardiology

## 2018-06-28 ENCOUNTER — Encounter: Payer: Self-pay | Admitting: Cardiology

## 2018-06-28 VITALS — BP 126/78 | HR 62 | Ht 71.5 in | Wt 305.1 lb

## 2018-06-28 DIAGNOSIS — I872 Venous insufficiency (chronic) (peripheral): Secondary | ICD-10-CM | POA: Diagnosis not present

## 2018-06-28 DIAGNOSIS — Z86711 Personal history of pulmonary embolism: Secondary | ICD-10-CM | POA: Diagnosis not present

## 2018-06-28 DIAGNOSIS — I471 Supraventricular tachycardia: Secondary | ICD-10-CM

## 2018-06-28 DIAGNOSIS — Z6841 Body Mass Index (BMI) 40.0 and over, adult: Secondary | ICD-10-CM

## 2018-06-28 DIAGNOSIS — E782 Mixed hyperlipidemia: Secondary | ICD-10-CM

## 2018-06-28 DIAGNOSIS — Z7901 Long term (current) use of anticoagulants: Secondary | ICD-10-CM | POA: Diagnosis not present

## 2018-06-28 DIAGNOSIS — Z7189 Other specified counseling: Secondary | ICD-10-CM

## 2018-06-28 MED ORDER — DILTIAZEM HCL ER COATED BEADS 120 MG PO CP24: 120.0000 mg | ORAL_CAPSULE | Freq: Every day | ORAL | 3 refills | Status: DC

## 2018-06-28 NOTE — Progress Notes (Signed)
Cardiology Office Note:    Date:  06/28/2018   ID:  Robert [name], DOB [birth year], MRN [id number]  PCP:  Cassandria Anger, MD  Cardiologist:  Buford Dresser, MD PhD (previously seen by Dr. Johnsie Cancel)  Referring MD: Cassandria Anger, MD   CC: post monitor follow up  History of Present Illness:    Robert [name] is a 68 y.o. male with a hx of prior DVT/PE, dyslipidemia, obesity, paroxysmal SVT who is seen in follow up at the request of [name], [name], MD for the evaluation and management of arrhythmia. He was previously seen by cardiology in 04/2014 by Dr. Johnsie Cancel. His initial consult with me was on 04/23/18. Please see that note for full review of his history.  Since his last visit, we placed a cardiac monitor, which showed very frequent SVT events as well as a WCT event. Echo without major abnormalities. I recommended starting a low dose ditiazem to try to control the tachycardia, but he was very concerned about side effects and did not wish to start a medication. He is here with his wife today to discuss options.   He continues to have frequent episodes of palpitations, worse when lying down but happens multiple times/day. We reviewed his medication history for this at length. It appears he was briefly on diltiazem during SVT episode while admitted in 2014, but this was changed to metoprolol and continue as an outpatient. However, the metoprolol severely affected his breathing, and this was stopped. Does not think he has tried anything else for the SVT since. Not interested in a procedure such as ablation.   Has chronic RLE edema and wears compression stocking. Very nervous about worsening edema.  Past Medical History:  Diagnosis Date  . Anxiety   . Asthma    "no attack since acupuncture in 1990's"  . Complication of anesthesia    SLOW TO WAKE UP / DIFFICULTY RESPONDING 2003, 3 days before could use left arm, "wild/crazy sometimes"  . Cyst of left kidney    "benign"  . DVT (deep venous thrombosis) ([place]) 2003   right femoral artery "from groin to knee"  . Edema    Right Leg w/ h/o DVT postphlebitic  . H/O blood transfusion reaction 2004   FFP, extreme swelling, could not breath  . Headache(784.0)    MIGRAINES-not for a long time  . History of pulmonary embolism 2003   both lungs  . Hyperlipidemia   . Knee pain    left  . LBP (low back pain)   . 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 Obesity   . Osteoarthritis   . Peripheral vascular disease ([place])    "poor circulation in  RT leg"  . PONV (postoperative nausea and vomiting)    during surgery in 1990's  . Spondylolisthesis   . Warfarin anticoagulation   . Wheezing    when laying on left side    Past Surgical History:  Procedure Laterality Date  . APPENDECTOMY  1993   Dr Margot Chimes  . CHOLECYSTECTOMY  1993  . FINGER SURGERY  2012   RT THUMB RECONSTRUCTION  . HAND SURGERY Right    abcess  . HEMORROIDECTOMY  1981  . I&D KNEE WITH POLY EXCHANGE Left 04/04/2013   Procedure: IRRIGATION AND DEBRIDEMENT KNEE WITH POLY EXCHANGE AND INSERTION OF CEMENT BEADS;  Surgeon: Gearlean Alf, MD;  Location: [facility];  Service: Orthopedics;  Laterality: Left;  . KNEE ARTHROSCOPY  1990  RT KNEE  . KNEE ARTHROSCOPY Right 2003  . KNEE ARTHROSCOPY  1988   L KNEE  . KNEE ARTHROSCOPY WITH MENISCAL REPAIR Left 10/22/2012   Procedure: LEFT KNEE ARTHROSCOPY PARTIAL MEDIAL AND LATERAL MENISCECTOMY AND DEBRIDEMENT, CHONDROPLASTY ;  Surgeon: Johnn Hai, MD;  Location: [facility];  Service: Orthopedics;  Laterality: Left;  . LUMBAR DISC SURGERY    . LUMBAR FUSION  2003   Dr Maxie Better  . ORCHIECTOMY  1994   R post injury  . TONSILLECTOMY  1967  . TOTAL KNEE ARTHROPLASTY Left 03/21/2013   Procedure: LEFT TOTAL KNEE ARTHROPLASTY;  Surgeon: Gearlean Alf, MD;  Location: [facility];  Service: Orthopedics;  Laterality: Left;    Current Medications: Current Outpatient Medications on File Prior to  Visit  Medication Sig  . albuterol (PROVENTIL) (2.5 MG/3ML) 0.083% nebulizer solution Take 3 mLs (2.5 mg total) by nebulization every 6 (six) hours as needed for wheezing or shortness of breath. Dx  J45.909  . Cyanocobalamin (VITAMIN B-12) 5000 MCG SUBL Place 5,000 tablets under the tongue every morning.  . fexofenadine (ALLEGRA ALLERGY) 180 MG tablet Take 1 tablet (180 mg total) by mouth daily as needed for allergies or rhinitis.  . fish oil-omega-3 fatty acids 1000 MG capsule Take 2 g by mouth every morning.  . fluticasone (FLONASE) 50 MCG/ACT nasal spray Place 2 sprays into both nostrils daily.  . furosemide (LASIX) 20 MG tablet Take 1 tablet (20 mg total) by mouth daily.  [name] glucosamine-chondroitin 500-400 MG tablet Take 1 tablet by mouth every morning.  . nortriptyline (PAMELOR) 10 MG capsule Take 10 mg by mouth at bedtime.  [name] PROAIR HFA 108 (90 Base) MCG/ACT inhaler INHALE 2 PUFFS EVERY 6 HOURS AS NEEDED FOR WHEEZING/SHORTNESS OF BREATH  . triamcinolone cream (KENALOG) 0.5 % Apply 1 application topically 3 (three) times daily.  [name] warfarin (COUMADIN) 3 MG tablet TAKE AS DIRECTED  . warfarin (COUMADIN) 6 MG tablet USE AS DIRECTED   No current facility-administered medications on file prior to visit.      Allergies:   Sulfa antibiotics; Anoro ellipta [umeclidinium-vilanterol]; Breo ellipta [fluticasone furoate-vilanterol]; Exalgo [hydromorphone hcl]; Fentanyl; Gabapentin; Hydrocodone-acetaminophen; Lopressor [metoprolol tartrate]; Penicillins; Sulfonamide derivatives; and Tylenol [acetaminophen]   Social History   Socioeconomic History  . Marital status: Married    Spouse name: Not on file  . Number of children: 3  . Years of education: Not on file  . Highest education level: Not on file  Occupational History  . Occupation: Disabled    Employer: DISABLED  Social Needs  . Financial resource strain: Not on file  . Food insecurity:    Worry: Not on file    Inability: Not on file  .  Transportation needs:    Medical: Not on file    Non-medical: Not on file  Tobacco Use  . Smoking status: Never Smoker  . Smokeless tobacco: Never Used  Substance and Sexual Activity  . Alcohol use: No  . Drug use: No  . Sexual activity: Yes  Lifestyle  . Physical activity:    Days per week: Not on file    Minutes per session: Not on file  . Stress: Not on file  Relationships  . Social connections:    Talks on phone: Not on file    Gets together: Not on file    Attends religious service: Not on file    Active member of club or organization: Not on file    Attends meetings of clubs  or organizations: Not on file    Relationship status: Not on file  Other Topics Concern  . Not on file  Social History Narrative  . Not on file     Family History: The patient's family history includes Heart disease in his mother and sister; Hyperlipidemia in an other family member; Hypertension in an other family member; Parkinsonism in an other family member. son passed away with hard heartbeats. Mother had 5V CABG about 10 years ago, had to have a pacemaker placed.  ROS:   Please see the history of present illness.  Additional pertinent ROS:  Constitutional: Negative for chills, fever, night sweats, unintentional weight loss  HENT: Negative for ear pain and hearing loss.   Eyes: Negative for loss of vision and eye pain.  Respiratory: Negative for cough, sputum, shortness of breath, wheezing.   Cardiovascular: Positive for palpitations, chronic LE edema on the right. Negative for chest pain, PND, orthopnea, and claudication.  Gastrointestinal: Negative for abdominal pain, melena, and hematochezia.  Genitourinary: Negative for dysuria and hematuria.  Musculoskeletal: Negative for falls and myalgias.  Skin: Negative for itching and rash. Neurological: Negative for focal weakness, focal sensory changes and loss of consciousness.  Endo/Heme/Allergies: Does not bruise/bleed easily.     EKGs/Labs/Other Studies Reviewed:    The following studies were reviewed today: Echo 05/06/18 - Left ventricle: The cavity size was normal. Systolic function was   normal. The estimated ejection fraction was in the range of 60%   to 65%. Wall motion was normal; there were no regional wall   motion abnormalities. - Aortic valve: There was very mild stenosis. Valve area (VTI):   1.67 cm^2. Valve area (Vmax): 1.85 cm^2. Valve area (Vmean): 1.59   cm^2. - Mitral valve: There was mild regurgitation. - Left atrium: The atrium was mildly to moderately dilated.  Impressions: - Left ventricular systolic function is preserved visually   estimated ejection fraction 60 to 65%.   Mild mitral regurgitation.   Mild to moderate left atrial enlargement   Very mild aortic stenosis.  Monitor 05/27/18 Patient had a min HR of 53 bpm, max HR of 214 bpm, and avg HR of 89 bpm. Predominant underlying rhythm was Sinus Rhythm. There were 9 patient triggered events, and Supraventricular Tachycardia was detected within +/- 45 seconds of symptomatic patient event(s).   Monitor noted 6 episodes of wide complex tachycardia; these are labeled as VT but are different morphology from the majority of his PVCs and bigeminy/trigeminy. Could be VT vs. SVT with aberrancy, given that these events occurred at a faster heart rate than his predominant SVT. The fastest WCT lasted 15 beats with a max rate of 197 bpm (avg 164 bpm); the run with the fastest interval was also the longest.   4752 narrow complex Supraventricular Tachycardia runs occurred, the run with the fastest interval lasting 1 min 37 secs with a max rate of 214 bpm, the longest lasting 56 mins 49 secs with an avg rate of 126 bpm. Some episodes of Supraventricular Tachycardia conducted with possible aberrancy. Isolated SVEs were frequent (10.9%, [id number]), SVE Couplets were rare (<1.0%, 7844), and SVE Triplets were rare (<1.0%, 793). Isolated VEs were occasional  (2.1%, [id number]), VE Couplets were rare (<1.0%, 338), and VE Triplets were rare (<1.0%, 5). Ventricular Bigeminy and Trigeminy were present.  Echo stress 03/27/11 - Stress ECG conclusions: The stress ECG was normal. - Staged echo: There was no echocardiographic evidence for stress-induced ischemia. Impressions:  - Stress echo with chest tightness but  no ST changes; no stress-induced wall motion abnormalities  EKG:  The ekg ordered previously demonstrates sinus rhythm with sinus arrhythmia.  Recent Labs: 02/05/2018: ALT 21; BUN 16; Creatinine, Ser 1.07; Hemoglobin 16.0; Platelets 275.0; Potassium 4.3; Sodium 137; TSH 3.37  Recent Lipid Panel    Component Value Date/Time   CHOL 217 (H) 02/05/2018 0755   TRIG 181.0 (H) 02/05/2018 0755   TRIG 118 04/30/2006 0841   HDL 29.90 (L) 02/05/2018 0755   CHOLHDL 7 02/05/2018 0755   VLDL 36.2 02/05/2018 0755   LDLCALC 151 (H) 02/05/2018 0755   LDLDIRECT 137.0 03/10/2017 0842    Physical Exam:    VS:  BP 126/78   Pulse 62   Ht 5' 11.5" (1.816 m)   Wt (!) 305 lb 1.3 oz (138.4 kg)   SpO2 95%   BMI 41.96 kg/m     Wt Readings from Last 3 Encounters:  06/28/18 (!) 305 lb 1.3 oz (138.4 kg)  04/23/18 (!) 303 lb (137.4 kg)  04/12/18 (!) 302 lb (137 kg)     GEN: Well nourished, well developed in no acute distress HEENT: Normal NECK: No JVD at 90 degrees; No carotid bruits LYMPHATICS: No lymphadenopathy CARDIAC: regular rhythm with occasional early beats, normal S1 and S2, no rubs, gallops. 1/6 SEM at RUSB with early peak and preserved S2. Radial and DP pulses 2+ bilaterally. RESPIRATORY:  Clear to auscultation without rales, wheezing or rhonchi  ABDOMEN: Soft, non-tender, non-distended MUSCULOSKELETAL:  No deformity  SKIN: RLE with compression stocking in place. LLE with trace edema.  NEUROLOGIC:  Alert and oriented x 3 PSYCHIATRIC:  Normal affect   ASSESSMENT:    1. Paroxysmal supraventricular tachycardia (HCC) Chronic  2. Class 3  severe obesity due to excess calories with serious comorbidity and body mass index (BMI) of 40.0 to 44.9 in adult [facility]) Chronic  3. History of pulmonary embolism   4. Long term current use of anticoagulant   5. Chronic venous insufficiency   6. Counseling on health promotion and disease prevention    PLAN:    1. Palpitations, history of paroxysmal SVT, with concern for bradycardia/heart block as well: nearly nightly symptoms. History of SVT after anesthesia in the past requiring cardioversion.  We discussed options at great length today. He had a very bad experience with his asthma and metoprolol, so we will avoid beta blockers. He is not interested in ablation at this time. We spent the majority of the time discussing diltiazem. We reviewed the insert re: side effects. He is most concerned about worsening peripheral edema, which I completely understands. My suggestion was to start at a low dose of diltiazem and see if he tolerates it. Once he is on a dose, if he has symptomatic improvement, we could repeat a monitor (only 48 hours) to evaluate his burden. If he has side effects or doesn't tolerate increased dose to symptom relief, we will have to evaluate other options, such as antiarrhythmics.   He is amenable to trialing low dose diltiazem and following up. We discussed the risk of no treatment, including continued symptoms and potentially tachycardia-induced cardiomyopathy in the future.  Discussed only briefly today: 2. History of DVT/PE with chronic venous insufficiency, on longer term anticoagulation with coumadin: happy with coumadin therapy, not interested in DOAC at this time. Uses compression stocking for insufficiency  3. Murmur: consistent with mild AS, confirmed with echo  4. Dyslipidemia: discussed risk at length. Declines statin therapy at this time. Did review that there are  additional non-statin therapies at this time, including vascepa and ezetimibe.  5. Prevention counseling  and recommendations: -recommend heart healthy/Mediterranean diet, with whole grains, fruits, vegetable, fish, lean meats, nuts, and olive oil. Limit salt. -recommend moderate walking, 3-5 times/week for 30-50 minutes each session. Aim for at least 150 minutes.week. Goal should be pace of 3 miles/hours, or walking 1.5 miles in 30 minutes -recommend avoidance of tobacco products. Avoid excess alcohol. -Additional risk factor control:  -Diabetes: A1c is from 2008, was 5.3.  -Lipids: as above  -Blood pressure control: at goal  -Weight: BMI 42. Lifestyle goals above -ASCVD risk score: The 10-year ASCVD risk score Mikey Bussing DC Brooke Bonito., et al., 2013) is: 18.5%   Values used to calculate the score:     Age: 54 years     Sex: Male     Is Non-Hispanic African American: No     Diabetic: No     Tobacco smoker: No     Systolic Blood Pressure: 793 mmHg     Is BP treated: No     HDL Cholesterol: 29.9 mg/dL     Total Cholesterol: 217 mg/dL   Plan for follow up: 1 month  TIME SPENT WITH PATIENT: 40 minutes of direct patient care. More than 50% of that time was spent on coordination of care and counseling regarding SVT risks, management options.  Buford Dresser, MD, PhD [place]  [facility]   Medication Adjustments/Labs and Tests Ordered: Current medicines are reviewed at length with the patient today.  Concerns regarding medicines are outlined above.  No orders of the defined types were placed in this encounter.  Meds ordered this encounter  Medications  . diltiazem (CARDIZEM CD) 120 MG 24 hr capsule    Sig: Take 1 capsule (120 mg total) by mouth daily.    Dispense:  30 capsule    Refill:  3    Patient Instructions  Medication Instructions: Start: Diltiazem 120 mg daily  If you need a refill on your cardiac medications before your next appointment, please call your pharmacy.   Labwork: None  Procedures/Testing: None  Follow-Up: Your physician recommends that you schedule  a follow-up appointment in 1 month with Dr. Harrell Gave.         Thank you for choosing [facility] at [facility]!!       Signed, Buford Dresser, MD PhD 06/28/2018 3:52 PM    [place]

## 2018-06-28 NOTE — Patient Instructions (Signed)
Medication Instructions: Start: Diltiazem 120 mg daily  If you need a refill on your cardiac medications before your next appointment, please call your pharmacy.   Labwork: None  Procedures/Testing: None  Follow-Up: Your physician recommends that you schedule a follow-up appointment in 1 month with Dr. Harrell Gave.         Thank you for choosing [facility] at [facility]!!

## 2018-07-01 ENCOUNTER — Telehealth: Payer: Self-pay | Admitting: Cardiology

## 2018-07-01 DIAGNOSIS — I471 Supraventricular tachycardia: Secondary | ICD-10-CM

## 2018-07-01 NOTE — Telephone Encounter (Signed)
Pt updated with Dr. Judeth Cornfield recommendation. Pt verbalized understanding. Referral placed for EP.

## 2018-07-01 NOTE — Telephone Encounter (Signed)
Agree with stopping diltiazem. If he is having difficulty breathing and/or throat swelling, he should seek emergency medical attention. I will refer him to EP as he has not tolerated medications (metoprolol and diltazem) for his SVT. Thank you.

## 2018-07-01 NOTE — Telephone Encounter (Signed)
Spoke with pt, he took the diltiazem for three days and yesterday he developed muscle fatigue, SOB, face flushing and severe stomach pain. He called his pharmacist and they told him to take benadryl and to call us. He has been taking benadryl all night and as soon as it wears off the symptoms return. He also reports swelling of his throat. Today he still has some SOB and muscle fatigue. He is frustrated because he is unable to tolerate the cardizem and is not sure what else there is to do. Will forward to dr Harrell Gave to review and advise.

## 2018-07-01 NOTE — Telephone Encounter (Signed)
New Message   Pt c/o medication issue:  1. Name of Medication: diltiazem (CARDIZEM CD) 120 MG 24 hr capsule    2. How are you currently taking this medication (dosage and times per day)? Take 1 capsule (120 mg total) by mouth daily.  3. Are you having a reaction (difficulty breathing--STAT)? Flushing, severe stomach pain and some shortness of breath  4. What is your medication issue? Patient is having reaction to medication. He did speak with the pharmacy and they told him to do not take it again until he speaks with the cardiologist.

## 2018-07-14 ENCOUNTER — Encounter: Payer: Self-pay | Admitting: Internal Medicine

## 2018-07-14 ENCOUNTER — Ambulatory Visit (INDEPENDENT_AMBULATORY_CARE_PROVIDER_SITE_OTHER): Payer: MEDICARE | Admitting: Internal Medicine

## 2018-07-14 VITALS — BP 132/82 | HR 65 | Temp 97.6°F | Ht 71.5 in | Wt 306.0 lb

## 2018-07-14 DIAGNOSIS — Z5181 Encounter for therapeutic drug level monitoring: Secondary | ICD-10-CM | POA: Diagnosis not present

## 2018-07-14 DIAGNOSIS — J0101 Acute recurrent maxillary sinusitis: Secondary | ICD-10-CM | POA: Diagnosis not present

## 2018-07-14 DIAGNOSIS — I471 Supraventricular tachycardia: Secondary | ICD-10-CM

## 2018-07-14 DIAGNOSIS — M545 Low back pain, unspecified: Secondary | ICD-10-CM

## 2018-07-14 DIAGNOSIS — E785 Hyperlipidemia, unspecified: Secondary | ICD-10-CM | POA: Diagnosis not present

## 2018-07-14 DIAGNOSIS — E538 Deficiency of other specified B group vitamins: Secondary | ICD-10-CM | POA: Diagnosis not present

## 2018-07-14 DIAGNOSIS — I350 Nonrheumatic aortic (valve) stenosis: Secondary | ICD-10-CM | POA: Diagnosis not present

## 2018-07-14 DIAGNOSIS — G8929 Other chronic pain: Secondary | ICD-10-CM | POA: Diagnosis not present

## 2018-07-14 MED ORDER — CEFUROXIME AXETIL 500 MG PO TABS: ORAL_TABLET | ORAL | 1 refills | Status: DC

## 2018-07-14 NOTE — Progress Notes (Signed)
Subjective:  Patient ID: Robert [name], male    DOB: 1951-04-13  Age: 68 y.o. MRN: [id number]  CC: No chief complaint on file.   HPI Robert [name] presents for palpitations - bad: will see Dr Rayann Heman C/o sinus pain x weeks, worse F/u OA, LBP  Outpatient Medications Prior to Visit  Medication Sig Dispense Refill  . albuterol (PROVENTIL) (2.5 MG/3ML) 0.083% nebulizer solution Take 3 mLs (2.5 mg total) by nebulization every 6 (six) hours as needed for wheezing or shortness of breath. Dx  J45.909 150 mL 3  . Cyanocobalamin (VITAMIN B-12) 5000 MCG SUBL Place 5,000 tablets under the tongue every morning.    . fexofenadine (ALLEGRA ALLERGY) 180 MG tablet Take 1 tablet (180 mg total) by mouth daily as needed for allergies or rhinitis. 90 tablet 3  . fish oil-omega-3 fatty acids 1000 MG capsule Take 2 g by mouth every morning.    . fluticasone (FLONASE) 50 MCG/ACT nasal spray Place 2 sprays into both nostrils daily. 16 g 6  . furosemide (LASIX) 20 MG tablet Take 1 tablet (20 mg total) by mouth daily. 90 tablet 3  . glucosamine-chondroitin 500-400 MG tablet Take 1 tablet by mouth every morning.    . nortriptyline (PAMELOR) 10 MG capsule Take 10 mg by mouth at bedtime.    [name] PROAIR HFA 108 (90 Base) MCG/ACT inhaler INHALE 2 PUFFS EVERY 6 HOURS AS NEEDED FOR WHEEZING/SHORTNESS OF BREATH 25.5 Inhaler 2  . triamcinolone cream (KENALOG) 0.5 % Apply 1 application topically 3 (three) times daily. 120 g 1  . warfarin (COUMADIN) 3 MG tablet TAKE AS DIRECTED 32 tablet 1  . warfarin (COUMADIN) 6 MG tablet USE AS DIRECTED 90 tablet 3  . diltiazem (CARDIZEM CD) 120 MG 24 hr capsule Take 1 capsule (120 mg total) by mouth daily. 30 capsule 3   No facility-administered medications prior to visit.     ROS: Review of Systems  Constitutional: Negative for appetite change, fatigue and unexpected weight change.  HENT: Positive for congestion, dental problem, postnasal drip, sinus pressure and sinus pain.  Negative for nosebleeds, sneezing, sore throat and trouble swallowing.   Eyes: Negative for itching and visual disturbance.  Respiratory: Negative for cough.   Cardiovascular: Positive for palpitations and leg swelling. Negative for chest pain.  Gastrointestinal: Negative for abdominal distention, blood in stool, diarrhea and nausea.  Genitourinary: Negative for frequency and hematuria.  Musculoskeletal: Positive for arthralgias, back pain and gait problem. Negative for joint swelling and neck pain.  Skin: Negative for rash.  Neurological: Negative for dizziness, tremors, speech difficulty and weakness.  Psychiatric/Behavioral: Negative for agitation, dysphoric mood and sleep disturbance. The patient is not nervous/anxious.     Objective:  BP 132/82 (BP Location: Left Arm, Patient Position: Sitting, Cuff Size: Large)   Pulse 65   Temp 97.6 F (36.4 C) (Oral)   Ht 5' 11.5" (1.816 m)   Wt (!) 306 lb (138.8 kg)   SpO2 96%   BMI 42.08 kg/m   BP Readings from Last 3 Encounters:  07/14/18 132/82  06/28/18 126/78  04/23/18 128/76    Wt Readings from Last 3 Encounters:  07/14/18 (!) 306 lb (138.8 kg)  06/28/18 (!) 305 lb 1.3 oz (138.4 kg)  04/23/18 (!) 303 lb (137.4 kg)    Physical Exam Constitutional:      General: He is not in acute distress.    Appearance: He is well-developed.     Comments: NAD  Eyes:  Conjunctiva/sclera: Conjunctivae normal.     Pupils: Pupils are equal, round, and reactive to light.  Neck:     Musculoskeletal: Normal range of motion.     Thyroid: No thyromegaly.     Vascular: No JVD.  Cardiovascular:     Rate and Rhythm: Normal rate and regular rhythm.     Heart sounds: Normal heart sounds. No murmur. No friction rub. No gallop.   Pulmonary:     Effort: Pulmonary effort is normal. No respiratory distress.     Breath sounds: Normal breath sounds. No wheezing or rales.  Chest:     Chest wall: No tenderness.  Abdominal:     General: Bowel sounds  are normal. There is no distension.     Palpations: Abdomen is soft. There is no mass.     Tenderness: There is no abdominal tenderness. There is no guarding or rebound.  Musculoskeletal: Normal range of motion.        General: No tenderness.  Lymphadenopathy:     Cervical: No cervical adenopathy.  Skin:    General: Skin is warm and dry.     Findings: No rash.  Neurological:     Mental Status: He is alert and oriented to person, place, and time.     Cranial Nerves: No cranial nerve deficit.     Motor: No abnormal muscle tone.     Coordination: Coordination normal.     Gait: Gait normal.     Deep Tendon Reflexes: Reflexes are normal and symmetric.  Psychiatric:        Behavior: Behavior normal.        Thought Content: Thought content normal.        Judgment: Judgment normal.   swollen nasal passages RLE edema Obese LS, knees - tender w/ROM  Lab Results  Component Value Date   WBC 7.2 02/05/2018   HGB 16.0 02/05/2018   HCT 46.9 02/05/2018   PLT 275.0 02/05/2018   GLUCOSE 120 (H) 02/05/2018   CHOL 217 (H) 02/05/2018   TRIG 181.0 (H) 02/05/2018   HDL 29.90 (L) 02/05/2018   LDLDIRECT 137.0 03/10/2017   LDLCALC 151 (H) 02/05/2018   ALT 21 02/05/2018   AST 22 02/05/2018   NA 137 02/05/2018   K 4.3 02/05/2018   CL 102 02/05/2018   CREATININE 1.07 02/05/2018   BUN 16 02/05/2018   CO2 29 02/05/2018   TSH 3.37 02/05/2018   PSA 0.14 03/10/2017   INR 2.9 06/11/2018   HGBA1C 5.3 04/19/2007    No results found.  Assessment & Plan:   There are no diagnoses linked to this encounter.   No orders of the defined types were placed in this encounter.    Follow-up: No follow-ups on file.  Walker Kehr, MD

## 2018-07-14 NOTE — Assessment & Plan Note (Signed)
F/u w/Dr Harrell Gave, Dr Rayann Heman. S/p Holter off Lopressor, off Cardizem

## 2018-07-14 NOTE — Assessment & Plan Note (Signed)
Statin intolerant 

## 2018-07-14 NOTE — Assessment & Plan Note (Signed)
On B12 

## 2018-07-14 NOTE — Patient Instructions (Signed)
If you have medicare related insurance (such as traditional Medicare, Blue Cross Medicare, United HealthCare Medicare, or similar), Please make an appointment at the scheduling desk with Jill, the Wellness Health Coach, for your Wellness visit in this office, which is a benefit with your insurance.  

## 2018-07-14 NOTE — Assessment & Plan Note (Signed)
Chronic  Potential benefits of a long term opioids use as well as potential risks (i.e. addiction risk, apnea etc) and complications (i.e. Somnolence, constipation and others) were explained to the patient and were aknowledged. on OxyIR prn

## 2018-07-14 NOTE — Assessment & Plan Note (Signed)
Can tolerate Ceftin ok - Rx Ceftin

## 2018-07-14 NOTE — Assessment & Plan Note (Signed)
On Coumadin 

## 2018-07-16 ENCOUNTER — Encounter: Payer: Self-pay | Admitting: Internal Medicine

## 2018-07-16 ENCOUNTER — Ambulatory Visit (INDEPENDENT_AMBULATORY_CARE_PROVIDER_SITE_OTHER): Payer: MEDICARE | Admitting: Internal Medicine

## 2018-07-16 VITALS — BP 150/78 | HR 83 | Ht 71.5 in | Wt 306.8 lb

## 2018-07-16 DIAGNOSIS — I471 Supraventricular tachycardia: Secondary | ICD-10-CM | POA: Diagnosis not present

## 2018-07-16 DIAGNOSIS — R0683 Snoring: Secondary | ICD-10-CM

## 2018-07-16 NOTE — Patient Instructions (Signed)
Medication Instructions:  No change If you need a refill on your cardiac medications before your next appointment, please call your pharmacy.   Lab work: none If you have labs (blood work) drawn today and your tests are completely normal, you will receive your results only by: [name] MyChart Message (if you have MyChart) OR . A paper copy in the mail If you have any lab test that is abnormal or we need to change your treatment, we will call you to review the results.  Testing/Procedures: Your physician has recommended that you have a sleep study. This test records several body functions during sleep, including: brain activity, eye movement, oxygen and carbon dioxide blood levels, heart rate and rhythm, breathing rate and rhythm, the flow of air through your mouth and nose, snoring, body muscle movements, and chest and belly movement.  Dr. Radford Pax to read.   Follow-Up: Please schedule a follow up with Dr. Harrell Gave (next available will be ok) Dr. Rayann Heman will see you as needed.   Any Other Special Instructions Will Be Listed Below (If Applicable).

## 2018-07-16 NOTE — Progress Notes (Signed)
Electrophysiology Office Note   Date:  07/16/2018   ID:  Robert [name], DOB 12/16/1950, MRN [id number]  PCP:  Cassandria Anger, MD  Cardiologist:  Dr Harrell Gave Primary Electrophysiologist: Thompson Grayer, MD    CC: SVT   History of Present Illness: Robert [name] is a 68 y.o. male who presents today for electrophysiology evaluation.   He is referred by Dr Harrell Gave for EP consultation regarding SVT.  The patient has a h/o SVT on prior monitor.  He has been treated with diltiazem without improvement.  He states that he had "throat swelling" with this medicine and it was discontinued.  He has not tolerated metoprolol due to SOB.  He has morbid obesity with chronic RLE edema and prior DVT/PTE.   He has numerous complaints today.  He feels that prior L knee infection post TKA has led to most of his issues, including his arrhythmia.  He is not very active.  He is primarily limited by his knee. He reports occasional palpitations.  During tachycardia, he does have some SOB and fatigue.  He has rare chest discomfort.  He snores and has daytime fatigue.  He does not feel well upon waking. Today, he denies symptoms of orthopnea, PND, lower extremity edema, claudication, dizziness, presyncope, syncope, bleeding, or neurologic sequela. The patient is tolerating medications without difficulties and is otherwise without complaint today.    Past Medical History:  Diagnosis Date  . Anxiety   . Asthma    "no attack since acupuncture in 1990's"  . Complication of anesthesia    SLOW TO WAKE UP / DIFFICULTY RESPONDING 2003, 3 days before could use left arm, "wild/crazy sometimes"  . Cyst of left kidney    "benign"  . DVT (deep venous thrombosis) ([place]) 2003   right femoral artery "from groin to knee"  . Edema    Right Leg w/ h/o DVT postphlebitic  . H/O blood transfusion reaction 2004   FFP, extreme swelling, could not breath  . Headache(784.0)    MIGRAINES-not for a long time  .  History of pulmonary embolism 2003   both lungs  . Hyperlipidemia   . Knee pain    left  . LBP (low back pain)   . 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 Obesity   . Osteoarthritis   . Peripheral vascular disease ([place])    "poor circulation in  RT leg"  . PONV (postoperative nausea and vomiting)    during surgery in 1990's  . Spondylolisthesis   . Warfarin anticoagulation   . Wheezing    when laying on left side   Past Surgical History:  Procedure Laterality Date  . APPENDECTOMY  1993   Dr Margot Chimes  . CHOLECYSTECTOMY  1993  . FINGER SURGERY  2012   RT THUMB RECONSTRUCTION  . HAND SURGERY Right    abcess  . HEMORROIDECTOMY  1981  . I&D KNEE WITH POLY EXCHANGE Left 04/04/2013   Procedure: IRRIGATION AND DEBRIDEMENT KNEE WITH POLY EXCHANGE AND INSERTION OF CEMENT BEADS;  Surgeon: Gearlean Alf, MD;  Location: [facility];  Service: Orthopedics;  Laterality: Left;  . KNEE ARTHROSCOPY  1990   RT KNEE  . KNEE ARTHROSCOPY Right 2003  . KNEE ARTHROSCOPY  1988   L KNEE  . KNEE ARTHROSCOPY WITH MENISCAL REPAIR Left 10/22/2012   Procedure: LEFT KNEE ARTHROSCOPY PARTIAL MEDIAL AND LATERAL MENISCECTOMY AND DEBRIDEMENT, CHONDROPLASTY ;  Surgeon: Johnn Hai, MD;  Location: [facility];  Service: Orthopedics;  Laterality: Left;  . LUMBAR DISC SURGERY    . LUMBAR FUSION  2003   Dr Maxie Better  . ORCHIECTOMY  1994   R post injury  . TONSILLECTOMY  1967  . TOTAL KNEE ARTHROPLASTY Left 03/21/2013   Procedure: LEFT TOTAL KNEE ARTHROPLASTY;  Surgeon: Gearlean Alf, MD;  Location: [facility];  Service: Orthopedics;  Laterality: Left;     Current Outpatient Medications  Medication Sig Dispense Refill  . albuterol (PROVENTIL) (2.5 MG/3ML) 0.083% nebulizer solution Take 3 mLs (2.5 mg total) by nebulization every 6 (six) hours as needed for wheezing or shortness of breath. Dx  J45.909 150 mL 3  . cefUROXime (CEFTIN) 500 MG tablet Take 500 mg by mouth 2 (two) times daily with a meal.    .  Cyanocobalamin (VITAMIN B-12) 5000 MCG SUBL Place 5,000 tablets under the tongue every morning.    . fexofenadine (ALLEGRA ALLERGY) 180 MG tablet Take 1 tablet (180 mg total) by mouth daily as needed for allergies or rhinitis. 90 tablet 3  . fish oil-omega-3 fatty acids 1000 MG capsule Take 2 g by mouth every morning.    . fluticasone (FLONASE) 50 MCG/ACT nasal spray Place 2 sprays into both nostrils daily. 16 g 6  . furosemide (LASIX) 20 MG tablet Take 1 tablet (20 mg total) by mouth daily. 90 tablet 3  . glucosamine-chondroitin 500-400 MG tablet Take 1 tablet by mouth every morning.    . nortriptyline (PAMELOR) 10 MG capsule Take 10 mg by mouth at bedtime.    [name] PROAIR HFA 108 (90 Base) MCG/ACT inhaler INHALE 2 PUFFS EVERY 6 HOURS AS NEEDED FOR WHEEZING/SHORTNESS OF BREATH 25.5 Inhaler 2  . triamcinolone cream (KENALOG) 0.5 % Apply 1 application topically 3 (three) times daily. 120 g 1  . warfarin (COUMADIN) 3 MG tablet TAKE AS DIRECTED 32 tablet 1  . warfarin (COUMADIN) 6 MG tablet USE AS DIRECTED 90 tablet 3   No current facility-administered medications for this visit.     Allergies:   Diltiazem; Sulfa antibiotics; Anoro ellipta [umeclidinium-vilanterol]; Breo ellipta [fluticasone furoate-vilanterol]; Exalgo [hydromorphone hcl]; Fentanyl; Gabapentin; Hydrocodone-acetaminophen; Lopressor [metoprolol tartrate]; Penicillins; Sulfonamide derivatives; and Tylenol [acetaminophen]   Social History:  The patient  reports that he has never smoked. He has never used smokeless tobacco. He reports that he does not drink alcohol or use drugs.   Family History:  The patient's family history includes Heart disease in his mother and sister; Hyperlipidemia in an other family member; Hypertension in an other family member; Parkinsonism in an other family member.    ROS:  Please see the history of present illness.   All other systems are personally reviewed and negative.    PHYSICAL EXAM: VS:  BP (!)  150/78   Pulse 83   Ht 5' 11.5" (1.816 m)   Wt (!) 306 lb 12.8 oz (139.2 kg)   SpO2 94%   BMI 42.19 kg/m  , BMI Body mass index is 42.19 kg/m. GEN: overweight, in no acute distress  HEENT: normal  Neck: no JVD, carotid bruits, or masses Cardiac: RRR; no murmurs, rubs, or gallops,no edema  Respiratory:  clear to auscultation bilaterally, normal work of breathing GI: soft, nontender, nondistended, + BS MS: no deformity or atrophy  Skin: warm and dry  Neuro:  Strength and sensation are intact Psych: euthymic mood, full affect  EKG:  EKG is ordered today. The ekg ordered today is personally reviewed and shows sinus rhythm with PACs,  PVCs, QTc 364 msec   Recent Labs: 02/05/2018: ALT 21; BUN 16; Creatinine, Ser 1.07; Hemoglobin 16.0; Platelets 275.0; Potassium 4.3; Sodium 137; TSH 3.37  personally reviewed   Lipid Panel     Component Value Date/Time   CHOL 217 (H) 02/05/2018 0755   TRIG 181.0 (H) 02/05/2018 0755   TRIG 118 04/30/2006 0841   HDL 29.90 (L) 02/05/2018 0755   CHOLHDL 7 02/05/2018 0755   VLDL 36.2 02/05/2018 0755   LDLCALC 151 (H) 02/05/2018 0755   LDLDIRECT 137.0 03/10/2017 0842   personally reviewed   Wt Readings from Last 3 Encounters:  07/16/18 (!) 306 lb 12.8 oz (139.2 kg)  07/14/18 (!) 306 lb (138.8 kg)  06/28/18 (!) 305 lb 1.3 oz (138.4 kg)     Other studies personally reviewed: Additional studies/ records that were reviewed today include: recent monitor, Dr Christopher's notes  Review of the above records today demonstrates: as above   ASSESSMENT AND PLAN:  1.  Atrial arrhythmias Review of his monitor shows frequent PACs, nonsustained atrial tachycardia, and disorganized atrial activity.  I am suspicious that he may eventually have AF diagnosed.  He does not appear to have a reentrant arrhythmia. He does not appear to have an ablatable SVT.  Given occlusive R femoral vein findings on [place] 2014, I think attempts of ablation would also be quite  challenging. I would advise weight reduction as a primary strategy.  In addition, I will order a sleep study as OSA is often associated with atrial dysrhythmias. He has not tolerated diltiazem or metoprolol and is currently not interested in any additional medications.  I did offer flecainide, multaq, sotalol, and tikosyn as options today. He is clear that he does not wish to make any changes to his current regimen.  He is chronically anticoagulated due to prior DVT with coumadin.  2. Snoring/ fatigue Sleep study is ordred  3. Morbid obesity Body mass index is 42.19 kg/m. Lifestyle modification is encouraged  4. HTN Elevated today He feels that his BP has been better controlled at home and does not wish for changes today.  Follow-up with Dr Harrell Gave going forward I will see as needed  Current medicines are reviewed at length with the patient today.   The patient does not have concerns regarding his medicines.  The following changes were made today:  none   Signed, Thompson Grayer, MD  07/16/2018 3:00 PM     [address] [place] [address] [place] [id number] [phone] (office) [phone] (fax)

## 2018-07-23 ENCOUNTER — Ambulatory Visit (INDEPENDENT_AMBULATORY_CARE_PROVIDER_SITE_OTHER): Payer: MEDICARE | Admitting: General Practice

## 2018-07-23 DIAGNOSIS — Z7901 Long term (current) use of anticoagulants: Secondary | ICD-10-CM

## 2018-07-23 DIAGNOSIS — Z86711 Personal history of pulmonary embolism: Secondary | ICD-10-CM

## 2018-07-23 LAB — POCT INR: INR: 2.6 (ref 2.0–3.0)

## 2018-07-23 NOTE — Patient Instructions (Addendum)
Pre visit review using our clinic review tool, if applicable. No additional management support is needed unless otherwise documented below in the visit note.  Continue to take 6 mg all days except 3 mg on Mondays and Fridays.  Re-check in 6 weeks. 

## 2018-07-26 ENCOUNTER — Ambulatory Visit: Payer: MEDICARE | Admitting: Cardiology

## 2018-07-28 ENCOUNTER — Institutional Professional Consult (permissible substitution): Payer: MEDICARE | Admitting: Cardiology

## 2018-07-29 ENCOUNTER — Encounter: Payer: Self-pay | Admitting: Cardiology

## 2018-07-29 ENCOUNTER — Ambulatory Visit: Payer: MEDICARE | Admitting: Cardiology

## 2018-07-29 ENCOUNTER — Ambulatory Visit (INDEPENDENT_AMBULATORY_CARE_PROVIDER_SITE_OTHER): Payer: MEDICARE | Admitting: Cardiology

## 2018-07-29 VITALS — BP 144/64 | HR 88 | Ht 71.0 in | Wt 306.4 lb

## 2018-07-29 DIAGNOSIS — I10 Essential (primary) hypertension: Secondary | ICD-10-CM

## 2018-07-29 DIAGNOSIS — E785 Hyperlipidemia, unspecified: Secondary | ICD-10-CM

## 2018-07-29 DIAGNOSIS — Z7901 Long term (current) use of anticoagulants: Secondary | ICD-10-CM | POA: Diagnosis not present

## 2018-07-29 DIAGNOSIS — Z86718 Personal history of other venous thrombosis and embolism: Secondary | ICD-10-CM

## 2018-07-29 DIAGNOSIS — I471 Supraventricular tachycardia: Secondary | ICD-10-CM

## 2018-07-29 DIAGNOSIS — Z86711 Personal history of pulmonary embolism: Secondary | ICD-10-CM

## 2018-07-29 DIAGNOSIS — Z6841 Body Mass Index (BMI) 40.0 and over, adult: Secondary | ICD-10-CM | POA: Diagnosis not present

## 2018-07-29 NOTE — Progress Notes (Signed)
Cardiology Office Note:    Date:  07/29/2018   ID:  Robert [name], DOB 09/18/1950, MRN [id number]  PCP:  Cassandria Anger, MD  Cardiologist:  Buford Dresser, MD PhD (previously seen by Dr. Johnsie Cancel)  Referring MD: Cassandria Anger, MD   CC: follow up  History of Present Illness:    Robert [name] is a 68 y.o. male with a hx of prior DVT/PE, dyslipidemia, obesity, paroxysmal SVT who is seen in follow up at the request of [name], [name], MD for the evaluation and management of arrhythmia. He was previously seen by cardiology in 04/2014 by Dr. Johnsie Cancel. His initial consult with me was on 04/23/18. Please see that note for full review of his history.  Updated cardiac history: Cardiac monitor showed very frequent SVT events as well as a WCT event. Echo without major abnormalities. I recommended starting a low dose ditiazem to try to control the tachycardia, but he was very concerned about side effects and did not wish to start a medication. He saw Dr. Rayann Heman in EP who also reviewed monitor, discussed management options, recommended sleep study.  Today: We discussed workup and management options to this point. He continues to have events. Blood pressure remains above goal, but he does not want to make any changes to medications or add any additional medications at this time. We discussed the recommendations for sleep study, but he is sure he would not tolerate mask or nasal pillow, and therefore he does not wish to pursue. He plans to continue to aim for weight loss to see if his symptoms improve.    Past Medical History:  Diagnosis Date  . Anxiety   . Asthma    "no attack since acupuncture in 1990's"  . Complication of anesthesia    SLOW TO WAKE UP / DIFFICULTY RESPONDING 2003, 3 days before could use left arm, "wild/crazy sometimes"  . Cyst of left kidney    "benign"  . DVT (deep venous thrombosis) ([place]) 2003   right femoral artery "from groin to knee"  . Edema    Right Leg w/ h/o DVT postphlebitic  . H/O blood transfusion reaction 2004   FFP, extreme swelling, could not breath  . Headache(784.0)    MIGRAINES-not for a long time  . History of pulmonary embolism 2003   both lungs  . Hyperlipidemia   . Knee pain    left  . LBP (low back pain)   . 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 Obesity   . Osteoarthritis   . Peripheral vascular disease ([place])    "poor circulation in  RT leg"  . PONV (postoperative nausea and vomiting)    during surgery in 1990's  . Spondylolisthesis   . Warfarin anticoagulation   . Wheezing    when laying on left side    Past Surgical History:  Procedure Laterality Date  . APPENDECTOMY  1993   Dr Margot Chimes  . CHOLECYSTECTOMY  1993  . FINGER SURGERY  2012   RT THUMB RECONSTRUCTION  . HAND SURGERY Right    abcess  . HEMORROIDECTOMY  1981  . I&D KNEE WITH POLY EXCHANGE Left 04/04/2013   Procedure: IRRIGATION AND DEBRIDEMENT KNEE WITH POLY EXCHANGE AND INSERTION OF CEMENT BEADS;  Surgeon: Gearlean Alf, MD;  Location: [facility];  Service: Orthopedics;  Laterality: Left;  . KNEE ARTHROSCOPY  1990   RT KNEE  . KNEE ARTHROSCOPY Right 2003  . KNEE ARTHROSCOPY  [place] ARTHROSCOPY WITH MENISCAL REPAIR Left 10/22/2012   Procedure: LEFT KNEE ARTHROSCOPY PARTIAL MEDIAL AND LATERAL MENISCECTOMY AND DEBRIDEMENT, CHONDROPLASTY ;  Surgeon: Johnn Hai, MD;  Location: [facility];  Service: Orthopedics;  Laterality: Left;  . LUMBAR DISC SURGERY    . LUMBAR FUSION  2003   Dr Maxie Better  . ORCHIECTOMY  1994   R post injury  . TONSILLECTOMY  1967  . TOTAL KNEE ARTHROPLASTY Left 03/21/2013   Procedure: LEFT TOTAL KNEE ARTHROPLASTY;  Surgeon: Gearlean Alf, MD;  Location: [facility];  Service: Orthopedics;  Laterality: Left;    Current Medications: Current Outpatient Medications on File Prior to Visit  Medication Sig  . albuterol (PROVENTIL) (2.5 MG/3ML) 0.083% nebulizer solution Take 3 mLs (2.5 mg  total) by nebulization every 6 (six) hours as needed for wheezing or shortness of breath. Dx  J45.909  . cefUROXime (CEFTIN) 500 MG tablet Take 500 mg by mouth 2 (two) times daily with a meal.  . Cyanocobalamin (VITAMIN B-12) 5000 MCG SUBL Place 5,000 tablets under the tongue every morning.  . fexofenadine (ALLEGRA ALLERGY) 180 MG tablet Take 1 tablet (180 mg total) by mouth daily as needed for allergies or rhinitis.  . fish oil-omega-3 fatty acids 1000 MG capsule Take 2 g by mouth every morning.  . fluticasone (FLONASE) 50 MCG/ACT nasal spray Place 2 sprays into both nostrils daily.  . furosemide (LASIX) 20 MG tablet Take 1 tablet (20 mg total) by mouth daily.  [name] glucosamine-chondroitin 500-400 MG tablet Take 1 tablet by mouth every morning.  . nortriptyline (PAMELOR) 10 MG capsule Take 10 mg by mouth at bedtime.  [name] PROAIR HFA 108 (90 Base) MCG/ACT inhaler INHALE 2 PUFFS EVERY 6 HOURS AS NEEDED FOR WHEEZING/SHORTNESS OF BREATH  . triamcinolone cream (KENALOG) 0.5 % Apply 1 application topically 3 (three) times daily.  [name] warfarin (COUMADIN) 3 MG tablet TAKE AS DIRECTED  . warfarin (COUMADIN) 6 MG tablet USE AS DIRECTED   No current facility-administered medications on file prior to visit.      Allergies:   Diltiazem; Sulfa antibiotics; Anoro ellipta [umeclidinium-vilanterol]; Breo ellipta [fluticasone furoate-vilanterol]; Exalgo [hydromorphone hcl]; Fentanyl; Gabapentin; Hydrocodone-acetaminophen; Lopressor [metoprolol tartrate]; Penicillins; Sulfonamide derivatives; and Tylenol [acetaminophen]   Social History   Socioeconomic History  . Marital status: Married    Spouse name: Not on file  . Number of children: 3  . Years of education: Not on file  . Highest education level: Not on file  Occupational History  . Occupation: Disabled    Employer: DISABLED  Social Needs  . Financial resource strain: Not on file  . Food insecurity:    Worry: Not on file    Inability: Not on file  .  Transportation needs:    Medical: Not on file    Non-medical: Not on file  Tobacco Use  . Smoking status: Never Smoker  . Smokeless tobacco: Never Used  Substance and Sexual Activity  . Alcohol use: No  . Drug use: No  . Sexual activity: Yes  Lifestyle  . Physical activity:    Days per week: Not on file    Minutes per session: Not on file  . Stress: Not on file  Relationships  . Social connections:    Talks on phone: Not on file    Gets together: Not on file    Attends religious service: Not on file    Active member of club or organization: Not on file  Attends meetings of clubs or organizations: Not on file    Relationship status: Not on file  Other Topics Concern  . Not on file  Social History Narrative  . Not on file     Family History: The patient's family history includes Heart disease in his mother and sister; Hyperlipidemia in an other family member; Hypertension in an other family member; Parkinsonism in an other family member. son passed away with hard heartbeats. Mother had 5V CABG about 10 years ago, had to have a pacemaker placed.  ROS:   Please see the history of present illness.  Additional pertinent ROS:  Constitutional: Negative for chills, fever, night sweats, unintentional weight loss  HENT: Negative for ear pain and hearing loss.   Eyes: Negative for loss of vision and eye pain.  Respiratory: Negative for cough, sputum, shortness of breath, wheezing.   Cardiovascular: Positive for palpitations, chronic LE edema on the right. Negative for chest pain, PND, orthopnea, and claudication.  Gastrointestinal: Negative for abdominal pain, melena, and hematochezia.  Genitourinary: Negative for dysuria and hematuria.  Musculoskeletal: Negative for falls and myalgias.  Skin: Negative for itching and rash. Neurological: Negative for focal weakness, focal sensory changes and loss of consciousness.  Endo/Heme/Allergies: Does not bruise/bleed easily.     EKGs/Labs/Other Studies Reviewed:    The following studies were reviewed today: Echo 05/06/18 - Left ventricle: The cavity size was normal. Systolic function was   normal. The estimated ejection fraction was in the range of 60%   to 65%. Wall motion was normal; there were no regional wall   motion abnormalities. - Aortic valve: There was very mild stenosis. Valve area (VTI):   1.67 cm^2. Valve area (Vmax): 1.85 cm^2. Valve area (Vmean): 1.59   cm^2. - Mitral valve: There was mild regurgitation. - Left atrium: The atrium was mildly to moderately dilated.  Impressions: - Left ventricular systolic function is preserved visually   estimated ejection fraction 60 to 65%.   Mild mitral regurgitation.   Mild to moderate left atrial enlargement   Very mild aortic stenosis.  Monitor 05/27/18 Patient had a min HR of 53 bpm, max HR of 214 bpm, and avg HR of 89 bpm. Predominant underlying rhythm was Sinus Rhythm. There were 9 patient triggered events, and Supraventricular Tachycardia was detected within +/- 45 seconds of symptomatic patient event(s).   Monitor noted 6 episodes of wide complex tachycardia; these are labeled as VT but are different morphology from the majority of his PVCs and bigeminy/trigeminy. Could be VT vs. SVT with aberrancy, given that these events occurred at a faster heart rate than his predominant SVT. The fastest WCT lasted 15 beats with a max rate of 197 bpm (avg 164 bpm); the run with the fastest interval was also the longest.   4752 narrow complex Supraventricular Tachycardia runs occurred, the run with the fastest interval lasting 1 min 37 secs with a max rate of 214 bpm, the longest lasting 56 mins 49 secs with an avg rate of 126 bpm. Some episodes of Supraventricular Tachycardia conducted with possible aberrancy. Isolated SVEs were frequent (10.9%, [id number]), SVE Couplets were rare (<1.0%, 7844), and SVE Triplets were rare (<1.0%, 793). Isolated VEs were occasional  (2.1%, [id number]), VE Couplets were rare (<1.0%, 338), and VE Triplets were rare (<1.0%, 5). Ventricular Bigeminy and Trigeminy were present.  Echo stress 03/27/11 - Stress ECG conclusions: The stress ECG was normal. - Staged echo: There was no echocardiographic evidence for stress-induced ischemia. Impressions:  - Stress echo  with chest tightness but no ST changes; no stress-induced wall motion abnormalities  EKG:  The ekg ordered previously demonstrates sinus rhythm with sinus arrhythmia.  Recent Labs: 02/05/2018: ALT 21; BUN 16; Creatinine, Ser 1.07; Hemoglobin 16.0; Platelets 275.0; Potassium 4.3; Sodium 137; TSH 3.37  Recent Lipid Panel    Component Value Date/Time   CHOL 217 (H) 02/05/2018 0755   TRIG 181.0 (H) 02/05/2018 0755   TRIG 118 04/30/2006 0841   HDL 29.90 (L) 02/05/2018 0755   CHOLHDL 7 02/05/2018 0755   VLDL 36.2 02/05/2018 0755   LDLCALC 151 (H) 02/05/2018 0755   LDLDIRECT 137.0 03/10/2017 0842    Physical Exam:    VS:  BP (!) 144/64   Pulse 88   Ht 5\' 11"  (1.803 m)   Wt (!) 306 lb 6.4 oz (139 kg)   BMI 42.73 kg/m     Wt Readings from Last 3 Encounters:  07/29/18 (!) 306 lb 6.4 oz (139 kg)  07/16/18 (!) 306 lb 12.8 oz (139.2 kg)  07/14/18 (!) 306 lb (138.8 kg)     GEN: Well nourished, well developed in no acute distress HEENT: Normal NECK: No JVD at 90 degrees; No carotid bruits LYMPHATICS: No lymphadenopathy CARDIAC: regular rhythm with occasional early beats, normal S1 and S2, no rubs, gallops. 1/6 SEM at RUSB with early peak and preserved S2. Radial and DP pulses 2+ bilaterally. RESPIRATORY:  Clear to auscultation without rales, wheezing or rhonchi  ABDOMEN: Soft, non-tender, non-distended MUSCULOSKELETAL:  No deformity  SKIN: RLE with compression stocking in place. LLE with trace edema.  NEUROLOGIC:  Alert and oriented x 3 PSYCHIATRIC:  Normal affect   ASSESSMENT:    1. Paroxysmal supraventricular tachycardia ([place])   2. Long term current  use of anticoagulant   3. History of pulmonary embolism   4. History of DVT of lower extremity   5. Essential hypertension   6. Dyslipidemia   7. Class 3 severe obesity due to excess calories with serious comorbidity and body mass index (BMI) of 40.0 to 44.9 in adult [facility])    PLAN:    1. Palpitations, history of paroxysmal SVT, with concern for bradycardia/heart block as well: has had monitor, echo, EP referral. Declines all medical therapy. We reviewed at length again that he has had extensive discussion of options and declines. Also recommended for sleep study, which he declines. We have in the past, and revisited today, the risk of frequent rapid heart rates and the stress it puts on the heart. He plans to manage on his own with weight loss and exercise. I am happy to assist him in the future if he reconsiders treatment; however, as we have discussed all options at length, I do not feel at this time that he needs to continue regular follow up with cardiology at this time. I would gladly see him again in the future if he ever wishes to reconsider treatment options.  2. History of DVT/PE with chronic venous insufficiency, on long term anticoagulation with coumadin: No changes, not interested in DOAC, continue coumadin therapy. Uses compression stocking for insufficiency  3. Dyslipidemia: reviewed again today, not interested in medical therapy for this. ASCVD risk score 26%.  4. Hypertension: above goal today. Not interested in any changes/additions to medical therapy.  5. Obesity: BMI 42. Working on weight loss   Plan for follow up: As needed.  TIME SPENT WITH PATIENT: 25 minutes of direct patient care. More than 50% of that time was spent on coordination of  care and counseling regarding SVT, hypertension, dyslipidemia risks, management options.  Buford Dresser, MD, PhD [place]  [facility]   Medication Adjustments/Labs and Tests Ordered: Current medicines are reviewed at  length with the patient today.  Concerns regarding medicines are outlined above.  No orders of the defined types were placed in this encounter.  No orders of the defined types were placed in this encounter.   Patient Instructions  Medication Instructions:  Your Physician recommend you continue on your current medication as directed.    If you need a refill on your cardiac medications before your next appointment, please call your pharmacy.   Lab work: None  Testing/Procedures: None  Follow-Up: At [facility], you and your health needs are our priority.  As part of our continuing mission to provide you with exceptional heart care, we have created designated Provider Care Teams.  These Care Teams include your primary Cardiologist (physician) and Advanced Practice Providers (APPs -  Physician Assistants and Nurse Practitioners) who all work together to provide you with the care you need, when you need it. You will need a follow up appointment as needed.  Please call our office 2 months in advance to schedule this appointment.  You may see Buford Dresser, MD or one of the following Advanced Practice Providers on your designated Care Team:   Rosaria Ferries, PA-C . [name], DNP, ANP       Signed, Buford Dresser, MD PhD 07/29/2018 4:13 PM    [facility]

## 2018-07-29 NOTE — Patient Instructions (Signed)

## 2018-07-30 ENCOUNTER — Telehealth: Payer: Self-pay | Admitting: *Deleted

## 2018-07-30 NOTE — Telephone Encounter (Signed)
Per Dr Rayann Heman sleep study ordered for patient ESS= 21

## 2018-08-02 ENCOUNTER — Telehealth: Payer: Self-pay | Admitting: *Deleted

## 2018-08-02 NOTE — Telephone Encounter (Signed)
Staff message sent to [employer] does not require a PA. Ok to schedule sleep study.

## 2018-08-02 NOTE — Telephone Encounter (Signed)
-----   Message from Freada Bergeron, [place] sent at 07/30/2018  5:31 PM EST ----- Regarding: precert May be a duplicate  split night

## 2018-08-05 NOTE — Telephone Encounter (Signed)
  Lauralee Evener, CMA  Freada Bergeron, CMA        No PA required. Ok to schedule sleep study.

## 2018-08-05 NOTE — Telephone Encounter (Signed)
Patient is scheduled for lab study on 09/30/18. Patient understands his sleep study will be done at [facility] sleep lab. Patient understands he will receive a sleep packet in a week or so. Patient understands to call if he does not receive the sleep packet in a timely manner. Left detailed message on voicemail with date and time of sleep study and informed patient to call back to confirm or reschedule.

## 2018-08-25 ENCOUNTER — Other Ambulatory Visit: Payer: Self-pay | Admitting: Internal Medicine

## 2018-09-03 ENCOUNTER — Ambulatory Visit: Payer: MEDICARE

## 2018-09-10 ENCOUNTER — Ambulatory Visit: Payer: MEDICARE

## 2018-09-14 ENCOUNTER — Other Ambulatory Visit: Payer: Self-pay | Admitting: Internal Medicine

## 2018-09-14 MED ORDER — PREDNISONE 5 MG PO TABS: 5.0000 mg | ORAL_TABLET | Freq: Every day | ORAL | 1 refills | Status: DC

## 2018-09-28 ENCOUNTER — Ambulatory Visit: Payer: MEDICARE

## 2018-09-30 ENCOUNTER — Encounter (HOSPITAL_BASED_OUTPATIENT_CLINIC_OR_DEPARTMENT_OTHER): Payer: MEDICARE

## 2018-10-08 ENCOUNTER — Ambulatory Visit (INDEPENDENT_AMBULATORY_CARE_PROVIDER_SITE_OTHER): Payer: MEDICARE | Admitting: General Practice

## 2018-10-08 DIAGNOSIS — I825Y1 Chronic embolism and thrombosis of unspecified deep veins of right proximal lower extremity: Secondary | ICD-10-CM

## 2018-10-08 DIAGNOSIS — Z7901 Long term (current) use of anticoagulants: Secondary | ICD-10-CM | POA: Diagnosis not present

## 2018-10-08 DIAGNOSIS — Z86711 Personal history of pulmonary embolism: Secondary | ICD-10-CM

## 2018-10-08 LAB — POCT INR: INR: 2.4 (ref 2.0–3.0)

## 2018-10-08 NOTE — Patient Instructions (Addendum)
Pre visit review using our clinic review tool, if applicable. No additional management support is needed unless otherwise documented below in the visit note.  Continue to take 6 mg all days except 3 mg on Mondays and Fridays.  Re-check in 6 weeks.

## 2018-10-11 NOTE — Progress Notes (Signed)
Medical screening examination/treatment/procedure(s) were performed by non-physician practitioner and as supervising physician I was immediately available for consultation/collaboration. I agree with above. Elasia Furnish, MD  

## 2018-10-13 ENCOUNTER — Telehealth (INDEPENDENT_AMBULATORY_CARE_PROVIDER_SITE_OTHER): Payer: MEDICARE | Admitting: Adult Health

## 2018-10-13 VITALS — BP 148/90 | HR 79 | Ht 71.0 in | Wt 300.0 lb

## 2018-10-13 DIAGNOSIS — Z86718 Personal history of other venous thrombosis and embolism: Secondary | ICD-10-CM | POA: Diagnosis not present

## 2018-10-13 DIAGNOSIS — R002 Palpitations: Secondary | ICD-10-CM

## 2018-10-13 DIAGNOSIS — I471 Supraventricular tachycardia: Secondary | ICD-10-CM | POA: Diagnosis not present

## 2018-10-13 DIAGNOSIS — Z79899 Other long term (current) drug therapy: Secondary | ICD-10-CM

## 2018-10-13 DIAGNOSIS — I10 Essential (primary) hypertension: Secondary | ICD-10-CM | POA: Diagnosis not present

## 2018-10-13 NOTE — Progress Notes (Signed)
Virtual Visit via Telephone Note   This visit type was conducted due to national recommendations for restrictions regarding the COVID-19 Pandemic (e.g. social distancing) in an effort to limit this patient's exposure and mitigate transmission in our community.  Due to his co-morbid illnesses, this patient is at least at moderate risk for complications without adequate follow up.  This format is felt to be most appropriate for this patient at this time.  The patient did not have access to video technology/had technical difficulties with video requiring transitioning to audio format only (telephone).  All issues noted in this document were discussed and addressed.  No physical exam could be performed with this format.  Please refer to the patient's chart for his  consent to telehealth for [facility].   Evaluation Performed:  Follow-up visit  Date:  10/13/2018   ID:  Robert [name], DOB 29-Mar-1951, MRN [id number]  Patient Location: Home Provider Location: Home  PCP:  [name], [name], MD  Cardiologist:  Buford Dresser, MD  Electrophysiologist:  None   Chief Complaint:  Elevated BP and irregular HR.   History of Present Illness:    Robert [name] is a 68 y.o. male for ongoing assessment and management of hypertension, with history of paroxysmal SVT, prior history of DVT/PE on Coumadin, dyslipidemia, and obesity.  He had also been followed by Dr. Rayann Heman, electrophysiology, concerning SVT.  A sleep study was also recommended. He was started on metoprolol succinate 25 mg daily.  He was last seen by Dr. Harrell Gave on 09/10/2018, and again recommendations for sleep study were discussed.  He states he was not sure that he would be able to tolerate a mask with the CPAP device.  The patient declines all medical therapy that was reviewed at length with him on that office visit.  He was to be followed on a as needed basis as he did not wish any cardiology diagnostic procedures or  interventions.  On 10/12/2018, the patient sent a My Chart message to his primary care physician concerning chronic right lower back pain.  He was prescribed p.o. steroids and was to follow-up in 1 month.  He sent a my chart message on 10/12/2018 to our office with complaints of elevated heart rate and blood pressure also complaining of heart skipping around feeling as if "it stops every day" and also a list of his blood pressures.  They have been running in the 542H to 062B systolic with diastolics of 76-283.  Dr. Harrell Gave replied to him stating that she wished to continue furosemide and not add HCTZ.  Lisinopril was recommended but a BMET was ordered to evaluate kidney function prior to beginning this medication.  Risks and benefits of the medication were documented and provided to the patient.  If the BMET was okay lisinopril 10 mg daily would be started with a follow-up be met in 2 weeks after beginning medication.    At the time of this office visit, the patient had not had blood drawn. He has multiple drug allergies, and addition of a medication for BP control will be done cautiously.   The patient does not have symptoms concerning for COVID-19 infection (fever, chills, cough, or new shortness of breath).    Past Medical History:  Diagnosis Date   Anxiety    Asthma    "no attack since acupuncture in 1517'O"   Complication of anesthesia    SLOW TO WAKE UP / DIFFICULTY RESPONDING 2003, 3 days before could use left arm, "  wild/crazy sometimes"   Cyst of left kidney    "benign"   DVT (deep venous thrombosis) ([place]) 2003   right femoral artery "from groin to knee"   Edema    Right Leg w/ h/o DVT postphlebitic   H/O blood transfusion reaction 2004   FFP, extreme swelling, could not breath   Headache(784.0)    MIGRAINES-not for a long time   History of pulmonary embolism 2003   both lungs   Hyperlipidemia    Knee pain    left   LBP (low back pain)    Lung nodule    "from  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, clear in 2012"   Obesity    Osteoarthritis    Peripheral vascular disease ([place])    "poor circulation in  RT leg"   PONV (postoperative nausea and vomiting)    during surgery in 1990's   Spondylolisthesis    Warfarin anticoagulation    Wheezing    when laying on left side   Past Surgical History:  Procedure Laterality Date   APPENDECTOMY  1993   Dr Margot Chimes   CHOLECYSTECTOMY  1993   FINGER SURGERY  2012   RT [place] Right    abcess   HEMORROIDECTOMY  1981   I&D KNEE WITH POLY EXCHANGE Left 04/04/2013   Procedure: IRRIGATION AND DEBRIDEMENT KNEE WITH POLY EXCHANGE AND INSERTION OF CEMENT BEADS;  Surgeon: Gearlean Alf, MD;  Location: [facility];  Service: Orthopedics;  Laterality: Left;   KNEE ARTHROSCOPY  1990   RT KNEE   KNEE ARTHROSCOPY Right 2003   KNEE ARTHROSCOPY  1988   L KNEE   KNEE ARTHROSCOPY WITH MENISCAL REPAIR Left 10/22/2012   Procedure: LEFT KNEE ARTHROSCOPY PARTIAL MEDIAL AND LATERAL MENISCECTOMY AND DEBRIDEMENT, CHONDROPLASTY ;  Surgeon: Johnn Hai, MD;  Location: [facility];  Service: Orthopedics;  Laterality: Left;   LUMBAR DISC SURGERY     LUMBAR FUSION  2003   Dr Maxie Better   ORCHIECTOMY  1994   R post injury   [place] Left 03/21/2013   Procedure: LEFT TOTAL KNEE ARTHROPLASTY;  Surgeon: Gearlean Alf, MD;  Location: [facility];  Service: Orthopedics;  Laterality: Left;     No outpatient medications have been marked as taking for the 10/13/18 encounter (Telemedicine) with Lendon Colonel, NP.     Allergies:   Diltiazem; Sulfa antibiotics; Anoro ellipta [umeclidinium-vilanterol]; Breo ellipta [fluticasone furoate-vilanterol]; Exalgo [hydromorphone hcl]; Fentanyl; Gabapentin; Hydrocodone-acetaminophen; Lopressor [metoprolol tartrate]; Penicillins; Sulfonamide derivatives; and Tylenol [acetaminophen]   Social History   Tobacco Use   Smoking status: Never  Smoker   Smokeless tobacco: Never Used  Substance Use Topics   Alcohol use: No   Drug use: No     Family Hx: The patient's family history includes Heart disease in his mother and sister; Hyperlipidemia in an other family member; Hypertension in an other family member; Parkinsonism in an other family member.  ROS:   Please see the history of present illness.    All other systems reviewed and are negative.   Prior CV studies:   The following studies were reviewed today:  Echocardiogram 05/06/2018  Left ventricle: The cavity size was normal. Systolic function was   normal. The estimated ejection fraction was in the range of 60%   to 65%. Wall motion was normal; there were no regional wall   motion abnormalities. - Aortic valve: There was very mild stenosis. Valve area (VTI):  1.67 cm^2. Valve area (Vmax): 1.85 cm^2. Valve area (Vmean): 1.59   cm^2. - Mitral valve: There was mild regurgitation. - Left atrium: The atrium was mildly to moderately dilated.  Impressions:  - Left ventricular systolic function is preserved visually   estimated ejection fraction 60 to 65%.   Mild mitral regurgitation.   Mild to moderate left atrial enlargement   Very mild aortic stenosis.  Cardiac Monitor 05/06/2018  Patient had a min HR of 53 bpm, max HR of 214 bpm, and avg HR of 89 bpm. Predominant underlying rhythm was Sinus Rhythm. There were 9 patient triggered events, and Supraventricular Tachycardia was detected within +/- 45 seconds of symptomatic patient event(s).   Monitor noted 6 episodes of wide complex tachycardia; these are labeled as VT but are different morphology from the majority of his PVCs and bigeminy/trigeminy. Could be VT vs. SVT with aberrancy, given that these events occurred at a faster heart rate than his predominant SVT. The fastest WCT lasted 15 beats with a max rate of 197 bpm (avg 164 bpm); the run with the fastest interval was also the longest.   4752 narrow  complex Supraventricular Tachycardia runs occurred, the run with the fastest interval lasting 1 min 37 secs with a max rate of 214 bpm, the longest lasting 56 mins 49 secs with an avg rate of 126 bpm. Some episodes of Supraventricular Tachycardia conducted with possible aberrancy. Isolated SVEs were frequent (10.9%, [id number]), SVE Couplets were rare (<1.0%, 7844), and SVE Triplets were rare (<1.0%, 793). Isolated VEs were occasional (2.1%, [id number]), VE Couplets were rare (<1.0%, 338), and VE Triplets were rare (<1.0%, 5). Ventricular Bigeminy and Trigeminy were present.  Echo stress 03/27/11 - Stress ECG conclusions: The stress ECG was normal. - Staged echo: There was no echocardiographic evidence for stress-induced ischemia. Impressions:  - Stress echo with chest tightness but no ST changes; no stress-induced wall motion abnormalities  EKG:  The ekg ordered previously demonstrates sinus rhythm with sinus arrhythmia.   Labs/Other Tests and Data Reviewed:    EKG:  No ECG reviewed.  Recent Labs: 02/05/2018: ALT 21; BUN 16; Creatinine, Ser 1.07; Hemoglobin 16.0; Platelets 275.0; Potassium 4.3; Sodium 137; TSH 3.37   Recent Lipid Panel Lab Results  Component Value Date/Time   CHOL 217 (H) 02/05/2018 07:55 AM   TRIG 181.0 (H) 02/05/2018 07:55 AM   TRIG 118 04/30/2006 08:41 AM   HDL 29.90 (L) 02/05/2018 07:55 AM   CHOLHDL 7 02/05/2018 07:55 AM   LDLCALC 151 (H) 02/05/2018 07:55 AM   LDLDIRECT 137.0 03/10/2017 08:42 AM    Wt Readings from Last 3 Encounters:  07/29/18 (!) 306 lb 6.4 oz (139 kg)  07/16/18 (!) 306 lb 12.8 oz (139.2 kg)  07/14/18 (!) 306 lb (138.8 kg)     Objective:    Vital Signs:  BP (!) 178/128 (BP Location: Left Arm)    Pulse 81    Limited by telephone visit. He has frequent sniffing and coughing from nasal allergies.  He is alert and able to ask and answer questions appropriately.   ASSESSMENT & PLAN:    1. Hypertension: He is allergic to a variety of  medications.  He will have his BMET drawn first and then will likely start him on losartan. BP range is very labile. I have also discussed with him the potential side effects of the  ARB medication. Will start losartan 25 mg daily to begin at low dose and allow for titration to evaluate his response  to the medication if BMET values are WNL's.  He will have close follow up evaluation of his response it 2 weeks, or sooner if he is symptomatic.   2. PSVT:  He does not tolerate metoprolol as this makes his Asthma worse. He has met with Dr. Rayann Heman and ablation is not recommended. He has other options for medications but does not want to pursue them due his fears of allergic response. He is having symptoms of racing HR and also palpitation, "feeling my heart stop." If symptoms get worse, may have to convince him to see EP again.   3.  Chronic Back Pain: Now on steroids which may be contributing to his higher BP..   4. Hx of DVT/PE: Continues on coumadin.   COVID-19 Education: The signs and symptoms of COVID-19 were discussed with the patient and how to seek care for testing (follow up with PCP or arrange E-visit). The importance of social distancing was discussed today.  Time:   Today, I have spent 15 minutes with the patient with telehealth technology discussing the above problems.     Medication Adjustments/Labs and Tests Ordered: Current medicines are reviewed at length with the patient today.  Concerns regarding medicines are outlined above.   Tests Ordered: BMET  Medication Changes: No orders of the defined types were placed in this encounter.   Disposition:  Follow up 2 weeks. Will need follow up BMET after starting ARB.   Signed, Phill Myron. West Pugh, ANP, [facility]  10/13/2018 10:54 AM      [facility]

## 2018-10-13 NOTE — Patient Instructions (Signed)
Medication Instructions:  NO CHANGES- Your physician recommends that you continue on your current medications as directed. Please refer to the Current Medication list given to you today. If you need a refill on your cardiac medications before your next appointment, please call your pharmacy.  Labwork: BMET-AFTER 2PM HERE IN OUR OFFICE AT [facility]  You will NOT need to fast   When you have your labs (blood work) drawn today and your tests are completely normal, you will receive your results only by MyChart Message (if you have MyChart) -OR-  A paper copy in the mail.  If you have any lab test that is abnormal or we need to change your treatment, we will call you to review these results.  Special Instructions: WE WILL CALL YOU WITH DIRECTIONS AND A PLAN AFTER WE RECEIVE LAB RESULTS.  Follow-Up: You will need a follow up appointment WE WILL DISCUSS THIS AFTER LAB RESULTS. Your Care Team consists of:  Buford Dresser, MD and the following Advanced Practice Providers:  Rosaria Ferries, PA-C  [name], DNP, ANP  At [facility], you and your health needs are our priority.  As part of our continuing mission to provide you with exceptional heart care, we have created designated Provider Care Teams.  These Care Teams include your primary Cardiologist (physician) and Advanced Practice Providers (APPs -  Physician Assistants and Nurse Practitioners) who all work together to provide you with the care you need, when you need it.  Thank you for choosing [facility] at [facility]!!

## 2018-10-13 NOTE — Telephone Encounter (Signed)
Hello--I replied to Robert [name] mychart comment. Could you call him? If he is amenable, he will need a BMET before starting (none since 01/2018) and if his renal function and potassium is ok, I would start lisinopril 10 mg daily. The side effects are dry cough and angioedema. He could also start valsartan 80 mg daily. This does not have the risk of cough but does have the risk of angioedema. If he starts either, he will then need a repeat BMET 10-14 days after starting. I am booked full and rounding next week, but if he wants to discuss via virtual visit we can set him up with [name]. Thanks!

## 2018-10-14 ENCOUNTER — Other Ambulatory Visit: Payer: Self-pay

## 2018-10-14 DIAGNOSIS — Z79899 Other long term (current) drug therapy: Secondary | ICD-10-CM

## 2018-10-14 LAB — BASIC METABOLIC PANEL
BUN/Creatinine Ratio: 13 (ref 10–24)
BUN: 14 mg/dL (ref 8–27)
CO2: 25 mmol/L (ref 20–29)
Calcium: 9.5 mg/dL (ref 8.6–10.2)
Chloride: 98 mmol/L (ref 96–106)
Creatinine, Ser: 1.09 mg/dL (ref 0.76–1.27)
GFR calc Af Amer: 81 mL/min/{1.73_m2} (ref >59)
GFR calc non Af Amer: 70 mL/min/{1.73_m2} (ref >59)
Glucose: 98 mg/dL (ref 65–99)
Potassium: 4.9 mmol/L (ref 3.5–5.2)
Sodium: 139 mmol/L (ref 134–144)

## 2018-10-14 MED ORDER — LOSARTAN POTASSIUM 25 MG PO TABS: 25.0000 mg | ORAL_TABLET | Freq: Every day | ORAL | 3 refills | Status: DC

## 2018-10-14 NOTE — Progress Notes (Signed)
I have reviewed the labs. He can begin losartan 25 mg p.o. daily. He is to have follow up labs in 2 weeks, and see Dr. Harrell Gave on follow up in 2 weeks after the labs. If he has any issues with the new medication he is to call us, as he has a lot of intolerances to medications. He has not had this medication before.

## 2018-10-21 DIAGNOSIS — M25511 Pain in right shoulder: Secondary | ICD-10-CM | POA: Diagnosis not present

## 2018-10-21 DIAGNOSIS — M7541 Impingement syndrome of right shoulder: Secondary | ICD-10-CM | POA: Diagnosis not present

## 2018-10-26 ENCOUNTER — Telehealth: Payer: Self-pay | Admitting: Cardiology

## 2018-10-26 NOTE — Telephone Encounter (Signed)
Spoke with pt, he is uncomfortable with coming into the office for lab work. He would like his wife to draw the blood and bring it to te office. Will need to touch base with [facility] person in the office to see if that is allowed.

## 2018-10-26 NOTE — Telephone Encounter (Signed)
Spoke with pt, aware they do not allow him to bring blood into the office.

## 2018-10-26 NOTE — Telephone Encounter (Signed)
Patient would like to know if his wife can draw his lab that are due.  She is a [employer] fund[employer], he states he doesn't feel comfortable waiting in a crowded waiting room to get labs done.  If she can do it he will need to know what color top she needs to use.

## 2018-10-28 DIAGNOSIS — Z79899 Other long term (current) drug therapy: Secondary | ICD-10-CM | POA: Diagnosis not present

## 2018-10-29 LAB — BASIC METABOLIC PANEL
BUN/Creatinine Ratio: 16 (ref 10–24)
BUN: 15 mg/dL (ref 8–27)
CO2: 24 mmol/L (ref 20–29)
Calcium: 9.9 mg/dL (ref 8.6–10.2)
Chloride: 98 mmol/L (ref 96–106)
Creatinine, Ser: 0.92 mg/dL (ref 0.76–1.27)
GFR calc Af Amer: 99 mL/min/{1.73_m2} (ref >59)
GFR calc non Af Amer: 86 mL/min/{1.73_m2} (ref >59)
Glucose: 94 mg/dL (ref 65–99)
Potassium: 4.4 mmol/L (ref 3.5–5.2)
Sodium: 138 mmol/L (ref 134–144)

## 2018-11-09 ENCOUNTER — Other Ambulatory Visit: Payer: Self-pay | Admitting: Internal Medicine

## 2018-11-19 ENCOUNTER — Ambulatory Visit: Payer: MEDICARE

## 2018-11-22 ENCOUNTER — Telehealth: Payer: Self-pay | Admitting: Cardiology

## 2018-11-22 NOTE — Telephone Encounter (Signed)
Telephone visit/my chart/pre reg complete/consent obtained -- ttf

## 2018-11-24 ENCOUNTER — Telehealth (INDEPENDENT_AMBULATORY_CARE_PROVIDER_SITE_OTHER): Payer: MEDICARE | Admitting: Cardiology

## 2018-11-24 ENCOUNTER — Encounter: Payer: Self-pay | Admitting: Cardiology

## 2018-11-24 VITALS — BP 175/116 | HR 69 | Wt 300.0 lb

## 2018-11-24 DIAGNOSIS — Z7189 Other specified counseling: Secondary | ICD-10-CM

## 2018-11-24 DIAGNOSIS — I10 Essential (primary) hypertension: Secondary | ICD-10-CM | POA: Diagnosis not present

## 2018-11-24 DIAGNOSIS — I471 Supraventricular tachycardia: Secondary | ICD-10-CM | POA: Diagnosis not present

## 2018-11-24 DIAGNOSIS — Z79899 Other long term (current) drug therapy: Secondary | ICD-10-CM | POA: Diagnosis not present

## 2018-11-24 MED ORDER — LOSARTAN POTASSIUM 50 MG PO TABS: 50.0000 mg | ORAL_TABLET | Freq: Every day | ORAL | 11 refills | Status: DC

## 2018-11-24 NOTE — Progress Notes (Signed)
Virtual Visit via Telephone Note   This visit type was conducted due to national recommendations for restrictions regarding the COVID-19 Pandemic (e.g. social distancing) in an effort to limit this patient's exposure and mitigate transmission in our community.  Due to his co-morbid illnesses, this patient is at least at moderate risk for complications without adequate follow up.  This format is felt to be most appropriate for this patient at this time.  The patient did not have access to video technology/had technical difficulties with video requiring transitioning to audio format only (telephone).  All issues noted in this document were discussed and addressed.  No physical exam could be performed with this format.  Please refer to the patient's chart for his  consent to telehealth for [facility].   Date:  11/24/2018   ID:  Robert [name], DOB 1951-03-18, MRN [id number]  Patient Location: Home Provider Location: Office  PCP:  Cassandria Anger, MD  Cardiologist:  Buford Dresser, MD  Electrophysiologist:  None   Evaluation Performed:  Follow-Up Visit  Chief Complaint:  Elevated bp  History of Present Illness:    Robert [name] is a 68 y.o. male with a hx of prior DVT/PE, dyslipidemia, obesity, paroxysmal SVT.  Cardiac history: Cardiac monitor showed very frequent SVT events as well as a WCT event. Echo without major abnormalities. I recommended starting a low dose ditiazem to try to control the tachycardia, but he was very concerned about side effects and did not wish to start a medication. He saw Dr. Rayann Heman in EP who also reviewed monitor, discussed management options, recommended sleep study.  The patient does not have symptoms concerning for COVID-19 infection (fever, chills, cough, or new shortness of breath).   Today:  Has been dealing with back pain and more recently rotator cuff pain. Is on prednisone, unknown duration but possibly long term, to manage these  issues. He has been taking the losartan as prescribed, no issues. BP log reviewed with him today. There are a few numbers at goal, but the majority of the readings are above goal. We discussed reasons to control BP, including risk of stroke and risk of bleeding given that he is on coumadin. He is amenable to making adjustments for better control.  Denies chest pain, shortness of breath at rest or with normal exertion. No PND, orthopne. No syncope. Has chronic LE edema. Occasional SVT, also seen on BP logs as elevated HR.  Past Medical History:  Diagnosis Date  . Anxiety   . Asthma    "no attack since acupuncture in 1990's"  . Complication of anesthesia    SLOW TO WAKE UP / DIFFICULTY RESPONDING 2003, 3 days before could use left arm, "wild/crazy sometimes"  . Cyst of left kidney    "benign"  . DVT (deep venous thrombosis) ([place]) 2003   right femoral artery "from groin to knee"  . Edema    Right Leg w/ h/o DVT postphlebitic  . H/O blood transfusion reaction 2004   FFP, extreme swelling, could not breath  . Headache(784.0)    MIGRAINES-not for a long time  . History of pulmonary embolism 2003   both lungs  . Hyperlipidemia   . Knee pain    left  . LBP (low back pain)   . 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 Obesity   . Osteoarthritis   . Peripheral vascular disease ([place])    "poor circulation in  RT leg"  .  PONV (postoperative nausea and vomiting)    during surgery in 1990's  . Spondylolisthesis   . Warfarin anticoagulation   . Wheezing    when laying on left side   Past Surgical History:  Procedure Laterality Date  . APPENDECTOMY  1993   Dr Margot Chimes  . CHOLECYSTECTOMY  1993  . FINGER SURGERY  2012   RT THUMB RECONSTRUCTION  . HAND SURGERY Right    abcess  . HEMORROIDECTOMY  1981  . I&D KNEE WITH POLY EXCHANGE Left 04/04/2013   Procedure: IRRIGATION AND DEBRIDEMENT KNEE WITH POLY EXCHANGE AND INSERTION OF CEMENT BEADS;  Surgeon: Gearlean Alf, MD;   Location: [facility];  Service: Orthopedics;  Laterality: Left;  . KNEE ARTHROSCOPY  1990   RT KNEE  . KNEE ARTHROSCOPY Right 2003  . KNEE ARTHROSCOPY  1988   L KNEE  . KNEE ARTHROSCOPY WITH MENISCAL REPAIR Left 10/22/2012   Procedure: LEFT KNEE ARTHROSCOPY PARTIAL MEDIAL AND LATERAL MENISCECTOMY AND DEBRIDEMENT, CHONDROPLASTY ;  Surgeon: Johnn Hai, MD;  Location: [facility];  Service: Orthopedics;  Laterality: Left;  . LUMBAR DISC SURGERY    . LUMBAR FUSION  2003   Dr Maxie Better  . ORCHIECTOMY  1994   R post injury  . TONSILLECTOMY  1967  . TOTAL KNEE ARTHROPLASTY Left 03/21/2013   Procedure: LEFT TOTAL KNEE ARTHROPLASTY;  Surgeon: Gearlean Alf, MD;  Location: [facility];  Service: Orthopedics;  Laterality: Left;     Current Meds  Medication Sig  . albuterol (PROVENTIL) (2.5 MG/3ML) 0.083% nebulizer solution Take 3 mLs (2.5 mg total) by nebulization every 6 (six) hours as needed for wheezing or shortness of breath. Dx  J45.909  . Cyanocobalamin (VITAMIN B-12) 5000 MCG SUBL Place 5,000 tablets under the tongue every morning.  . fish oil-omega-3 fatty acids 1000 MG capsule Take 2 g by mouth every morning.  . fluticasone (FLONASE) 50 MCG/ACT nasal spray Place 2 sprays into both nostrils daily.  . furosemide (LASIX) 20 MG tablet Take 1 tablet (20 mg total) by mouth daily.  [name] glucosamine-chondroitin 500-400 MG tablet Take 1 tablet by mouth every morning.  [name] losartan (COZAAR) 50 MG tablet Take 1 tablet (50 mg total) by mouth daily.  . nortriptyline (PAMELOR) 10 MG capsule Take 10 mg by mouth at bedtime.  . predniSONE (DELTASONE) 5 MG tablet TAKE 1 TABLET BY MOUTH EVERY DAY WITH BREAKFAST  . warfarin (COUMADIN) 3 MG tablet TAKE AS DIRECTED  . warfarin (COUMADIN) 6 MG tablet USE AS DIRECTED  . [DISCONTINUED] losartan (COZAAR) 25 MG tablet Take 1 tablet (25 mg total) by mouth daily.     Allergies:   Diltiazem; Sulfa antibiotics; Anoro ellipta [umeclidinium-vilanterol]; Breo ellipta [fluticasone  furoate-vilanterol]; Exalgo [hydromorphone hcl]; Fentanyl; Gabapentin; Hydrocodone-acetaminophen; Lopressor [metoprolol tartrate]; Penicillins; Sulfonamide derivatives; and Tylenol [acetaminophen]   Social History   Tobacco Use  . Smoking status: Never Smoker  . Smokeless tobacco: Never Used  Substance Use Topics  . Alcohol use: No  . Drug use: No     Family Hx: The patient's family history includes Heart disease in his mother and sister; Hyperlipidemia in an other family member; Hypertension in an other family member; Parkinsonism in an other family member.  ROS:   Please see the history of present illness.    Constitutional: Negative for chills, fever, night sweats, unintentional weight loss  HENT: Negative for ear pain and hearing loss.   Eyes: Negative for loss of vision and eye pain.  Respiratory: Negative for  cough, sputum, wheezing.   Cardiovascular: See HPI. Gastrointestinal: Negative for abdominal pain, melena, and hematochezia.  Genitourinary: Negative for dysuria and hematuria.  Musculoskeletal: Positive for back and rotator cuff pain Skin: Negative for itching and rash.  Neurological: Negative for focal weakness, focal sensory changes and loss of consciousness.  Endo/Heme/Allergies: Does bruise/bleed easily.  All other systems reviewed and are negative.   Prior CV studies:   The following studies were reviewed today: Monitor 04/2018 Echo 04/2018  Labs/Other Tests and Data Reviewed:    EKG:  An ECG dated 07/16/18 was personally reviewed today and demonstrated:  SR with PACs  Recent Labs: 02/05/2018: ALT 21; Hemoglobin 16.0; Platelets 275.0; TSH 3.37 10/28/2018: BUN 15; Creatinine, Ser 0.92; Potassium 4.4; Sodium 138   Recent Lipid Panel Lab Results  Component Value Date/Time   CHOL 217 (H) 02/05/2018 07:55 AM   TRIG 181.0 (H) 02/05/2018 07:55 AM   TRIG 118 04/30/2006 08:41 AM   HDL 29.90 (L) 02/05/2018 07:55 AM   CHOLHDL 7 02/05/2018 07:55 AM   LDLCALC 151  (H) 02/05/2018 07:55 AM   LDLDIRECT 137.0 03/10/2017 08:42 AM    Wt Readings from Last 3 Encounters:  11/24/18 300 lb (136.1 kg)  10/13/18 300 lb (136.1 kg)  07/29/18 (!) 306 lb 6.4 oz (139 kg)     Objective:    Vital Signs:  BP (!) 175/116 Comment: 6 am this morning  Pulse 69   Wt 300 lb (136.1 kg)   SpO2 95%   BMI 41.84 kg/m     ASSESSMENT & PLAN:    Hypertension: above goal today. Tolerating losartan 25 mg daily -will increase to 50 mg daily -he will continue to check BP and send a log in ~2 weeks -he will either get BMET at upcoming PCP appt, or we will check in our office in two weeks -goal <130/80. Likely exacerbated by prednisone use.  Paroxysmal SVT, with concern for bradycardia/heart block as well: has had monitor, echo, EP referral.  -noted on BP logs -working on exercise/weight loss -declines sleep study -not interested in medications/procedures at this time  COVID-19 Education: The signs and symptoms of COVID-19 were discussed with the patient and how to seek care for testing (follow up with PCP or arrange E-visit).  The importance of social distancing was discussed today.  Not addressed today:  History of DVT/PE with chronic venous insufficiency, on long term anticoagulation with coumadin: declines DOAC, continue coumadin therapy. Uses compression stocking for insufficiency  Dyslipidemia: declines lipid lowering agents  Obesity: BMI 42. Working on weight loss   Time:   Today, I have spent 15 minutes with the patient with telehealth technology discussing the above problems.    Patient Instructions  Medication Instructions:  Increase Losartan 50 mg daily  If you need a refill on your cardiac medications before your next appointment, please call your pharmacy.   Lab work: None  Testing/Procedures: None  Follow-Up: At [facility], you and your health needs are our priority.  As part of our continuing mission to provide you with exceptional  heart care, we have created designated Provider Care Teams.  These Care Teams include your primary Cardiologist (physician) and Advanced Practice Providers (APPs -  Physician Assistants and Nurse Practitioners) who all work together to provide you with the care you need, when you need it. You will need a follow up appointment in 3 months.  Please call our office 2 months in advance to schedule this appointment.  You may see Ulysee Fyock  Harrell Gave, MD or one of the following Advanced Practice Providers on your designated Care Team:   Rosaria Ferries, [place] . [name], DNP, ANP     Medication Adjustments/Labs and Tests Ordered: Current medicines are reviewed at length with the patient today.  Concerns regarding medicines are outlined above.   Tests Ordered: No orders of the defined types were placed in this encounter.   Medication Changes: Meds ordered this encounter  Medications  . losartan (COZAAR) 50 MG tablet    Sig: Take 1 tablet (50 mg total) by mouth daily.    Dispense:  30 tablet    Refill:  11    Disposition:  Follow up 3 mos or sooner PRN  Signed, Buford Dresser, MD  11/24/2018 1:10 PM    [facility]

## 2018-11-24 NOTE — Patient Instructions (Addendum)
Medication Instructions:  Increase Losartan 50 mg daily  If you need a refill on your cardiac medications before your next appointment, please call your pharmacy.   Lab work: None  Testing/Procedures: None  Follow-Up: At [facility], you and your health needs are our priority.  As part of our continuing mission to provide you with exceptional heart care, we have created designated Provider Care Teams.  These Care Teams include your primary Cardiologist (physician) and Advanced Practice Providers (APPs -  Physician Assistants and Nurse Practitioners) who all work together to provide you with the care you need, when you need it. You will need a follow up appointment in 3 months.  Please call our office 2 months in advance to schedule this appointment.  You may see Buford Dresser, MD or one of the following Advanced Practice Providers on your designated Care Team:   Rosaria Ferries, PA-C . [name], DNP, ANP

## 2018-11-30 ENCOUNTER — Ambulatory Visit (INDEPENDENT_AMBULATORY_CARE_PROVIDER_SITE_OTHER): Payer: MEDICARE | Admitting: General Practice

## 2018-11-30 ENCOUNTER — Other Ambulatory Visit: Payer: Self-pay

## 2018-11-30 DIAGNOSIS — Z7901 Long term (current) use of anticoagulants: Secondary | ICD-10-CM

## 2018-11-30 DIAGNOSIS — Z86711 Personal history of pulmonary embolism: Secondary | ICD-10-CM

## 2018-11-30 LAB — POCT INR: INR: 3.1 — AB (ref 2.0–3.0)

## 2018-11-30 NOTE — Patient Instructions (Addendum)
Pre visit review using our clinic review tool, if applicable. No additional management support is needed unless otherwise documented below in the visit note.  Take 1/2 dose today (6/9) and then continue to take 6 mg all days except 3 mg on Mondays and Fridays.  Re-check in 6 weeks.

## 2018-12-21 NOTE — Telephone Encounter (Signed)
Opened in error

## 2019-01-07 ENCOUNTER — Ambulatory Visit (INDEPENDENT_AMBULATORY_CARE_PROVIDER_SITE_OTHER): Payer: MEDICARE | Admitting: General Practice

## 2019-01-07 ENCOUNTER — Other Ambulatory Visit: Payer: Self-pay

## 2019-01-07 DIAGNOSIS — Z86711 Personal history of pulmonary embolism: Secondary | ICD-10-CM

## 2019-01-07 DIAGNOSIS — Z7901 Long term (current) use of anticoagulants: Secondary | ICD-10-CM | POA: Diagnosis not present

## 2019-01-07 LAB — POCT INR: INR: 3.5 — AB (ref 2.0–3.0)

## 2019-01-07 NOTE — Patient Instructions (Addendum)
Pre visit review using our clinic review tool, if applicable. No additional management support is needed unless otherwise documented below in the visit note.  Hold coumadin today (7/17) and then change dosage and take 6 mg all days except 3 mg on Mon Wed and Fridays.  Re-check in 3 weeks.

## 2019-01-09 ENCOUNTER — Other Ambulatory Visit: Payer: Self-pay | Admitting: Internal Medicine

## 2019-01-13 ENCOUNTER — Ambulatory Visit: Payer: MEDICARE | Admitting: Internal Medicine

## 2019-01-14 ENCOUNTER — Ambulatory Visit: Payer: MEDICARE

## 2019-01-28 ENCOUNTER — Ambulatory Visit (INDEPENDENT_AMBULATORY_CARE_PROVIDER_SITE_OTHER): Payer: MEDICARE | Admitting: General Practice

## 2019-01-28 ENCOUNTER — Other Ambulatory Visit: Payer: Self-pay

## 2019-01-28 DIAGNOSIS — Z7901 Long term (current) use of anticoagulants: Secondary | ICD-10-CM | POA: Diagnosis not present

## 2019-01-28 DIAGNOSIS — Z86711 Personal history of pulmonary embolism: Secondary | ICD-10-CM

## 2019-01-28 LAB — POCT INR: INR: 2.1 (ref 2.0–3.0)

## 2019-01-28 NOTE — Patient Instructions (Signed)
Pre visit review using our clinic review tool, if applicable. No additional management support is needed unless otherwise documented below in the visit note.  Continue to take 6 mg all days except 3 mg on Mon Wed and Fridays.  Re-check in 4 weeks.

## 2019-02-25 ENCOUNTER — Encounter: Payer: Self-pay | Admitting: Cardiology

## 2019-02-27 ENCOUNTER — Other Ambulatory Visit: Payer: Self-pay | Admitting: Internal Medicine

## 2019-03-01 ENCOUNTER — Ambulatory Visit: Payer: MEDICARE | Admitting: Cardiology

## 2019-03-04 ENCOUNTER — Other Ambulatory Visit: Payer: Self-pay | Admitting: General Practice

## 2019-03-04 ENCOUNTER — Ambulatory Visit (INDEPENDENT_AMBULATORY_CARE_PROVIDER_SITE_OTHER): Payer: MEDICARE | Admitting: General Practice

## 2019-03-04 ENCOUNTER — Other Ambulatory Visit: Payer: Self-pay

## 2019-03-04 DIAGNOSIS — Z7901 Long term (current) use of anticoagulants: Secondary | ICD-10-CM

## 2019-03-04 DIAGNOSIS — I82409 Acute embolism and thrombosis of unspecified deep veins of unspecified lower extremity: Secondary | ICD-10-CM | POA: Diagnosis not present

## 2019-03-04 DIAGNOSIS — Z86711 Personal history of pulmonary embolism: Secondary | ICD-10-CM | POA: Diagnosis not present

## 2019-03-04 LAB — POCT INR: INR: 3.8 — AB (ref 2.0–3.0)

## 2019-03-04 MED ORDER — WARFARIN SODIUM 3 MG PO TABS: ORAL_TABLET | ORAL | 1 refills | Status: DC

## 2019-03-04 MED ORDER — WARFARIN SODIUM 6 MG PO TABS: ORAL_TABLET | ORAL | 1 refills | Status: DC

## 2019-03-04 NOTE — Patient Instructions (Addendum)
Pre visit review using our clinic review tool, if applicable. No additional management support is needed unless otherwise documented below in the visit note.  Hold coumadin today (9/11) and take 3 mg tomorrow (9/12) and then continue to take 6 mg all days except 3 mg on Mon Wed and Fridays.  Re-check in 3 to 4 weeks.

## 2019-03-12 ENCOUNTER — Other Ambulatory Visit: Payer: Self-pay | Admitting: Internal Medicine

## 2019-03-14 ENCOUNTER — Ambulatory Visit: Payer: Self-pay

## 2019-03-14 NOTE — Telephone Encounter (Addendum)
Patient called stating that today he cleaned his right ear and removed a large amount of wax. He states that some time after his ear started to bleed. He feels that it has just about stopped but it did bleed frank red blood for a while. He states that he takes coumadin 3mg  Monday Wednesday and friday and 6mg  Saturday and Sunday. His last INR was 3.8. He denies pain to the ear. He states that he does have low grade HA with sinus issues. BP 149/81 HR 81 today. Care advice read to patient. Patient transferred to office for scheduling.  Reason for Disposition . [1] Bleeding recurs AND [2] from cotton swab or other minor trauma  Answer Assessment - Initial Assessment Questions 1. MECHANISM: "How did the injury happen?"      Cleaned ear rt ear 2. ONSET: "When did the injury happen?" (Minutes or hours ago)     today 3. LOCATION: "What part of the ear is injured?"      unsure 4. APPEARANCE: "What does the ear look like?"      bleeding 5. HEARING: "Was the hearing damaged?"     hearing 6. SIZE: For cuts, bruises, or swelling, ask: "How large is it?" (e.g., inches or centimeters)     unsure 7. PAIN: "Is it painful?" If so, ask: "How bad is the pain?"    (e.g., Scale 1-10; or mild, moderate, severe)    Low grade HA for 2 weeks 8. TETANUS: For any breaks in the skin, ask: "When was the last tetanus booster?"     N/A 9. OTHER SYMPTOMS: "Do you have any other symptoms?" (e.g., neck pain, headache, loss of consciousness)   Headache ear is bleeding, INR 3.8 10. PREGNANCY: "Is there any chance you are pregnant?" "When was your last menstrual period?"       N/A  Protocols used: EAR INJURY-A-AH

## 2019-03-15 ENCOUNTER — Other Ambulatory Visit: Payer: Self-pay | Admitting: General Practice

## 2019-03-15 DIAGNOSIS — Z7901 Long term (current) use of anticoagulants: Secondary | ICD-10-CM

## 2019-03-21 ENCOUNTER — Other Ambulatory Visit: Payer: Self-pay

## 2019-03-21 ENCOUNTER — Encounter: Payer: Self-pay | Admitting: Internal Medicine

## 2019-03-21 ENCOUNTER — Ambulatory Visit (INDEPENDENT_AMBULATORY_CARE_PROVIDER_SITE_OTHER): Payer: MEDICARE | Admitting: Internal Medicine

## 2019-03-21 VITALS — BP 166/84 | HR 94 | Temp 98.6°F | Ht 71.0 in | Wt 318.0 lb

## 2019-03-21 DIAGNOSIS — Z86718 Personal history of other venous thrombosis and embolism: Secondary | ICD-10-CM

## 2019-03-21 DIAGNOSIS — M79651 Pain in right thigh: Secondary | ICD-10-CM

## 2019-03-21 DIAGNOSIS — G4489 Other headache syndrome: Secondary | ICD-10-CM | POA: Diagnosis not present

## 2019-03-21 DIAGNOSIS — M545 Low back pain, unspecified: Secondary | ICD-10-CM

## 2019-03-21 DIAGNOSIS — R945 Abnormal results of liver function studies: Secondary | ICD-10-CM | POA: Diagnosis not present

## 2019-03-21 DIAGNOSIS — J4520 Mild intermittent asthma, uncomplicated: Secondary | ICD-10-CM | POA: Diagnosis not present

## 2019-03-21 DIAGNOSIS — E538 Deficiency of other specified B group vitamins: Secondary | ICD-10-CM | POA: Diagnosis not present

## 2019-03-21 DIAGNOSIS — E785 Hyperlipidemia, unspecified: Secondary | ICD-10-CM

## 2019-03-21 DIAGNOSIS — G8929 Other chronic pain: Secondary | ICD-10-CM

## 2019-03-21 DIAGNOSIS — Z7901 Long term (current) use of anticoagulants: Secondary | ICD-10-CM | POA: Diagnosis not present

## 2019-03-21 DIAGNOSIS — Z23 Encounter for immunization: Secondary | ICD-10-CM

## 2019-03-21 DIAGNOSIS — R7989 Other specified abnormal findings of blood chemistry: Secondary | ICD-10-CM

## 2019-03-21 DIAGNOSIS — Z6841 Body Mass Index (BMI) 40.0 and over, adult: Secondary | ICD-10-CM

## 2019-03-21 NOTE — Progress Notes (Signed)
Subjective:  Patient ID: Robert [name], male    DOB: 10-10-1950  Age: 68 y.o. MRN: [id number]  CC: No chief complaint on file.   HPI Robert [name] presents for a low grade HA, bleeding from R ear, sensitive teeth on the R side..INR/Prothrombin Time was elevated C/o HTN F/u sciatica - can't do w/o prednisone...  Outpatient Medications Prior to Visit  Medication Sig Dispense Refill  . albuterol (PROVENTIL) (2.5 MG/3ML) 0.083% nebulizer solution Take 3 mLs (2.5 mg total) by nebulization every 6 (six) hours as needed for wheezing or shortness of breath. Dx  J45.909 150 mL 3  . Cyanocobalamin (VITAMIN B-12) 5000 MCG SUBL Place 5,000 tablets under the tongue every morning.    . fish oil-omega-3 fatty acids 1000 MG capsule Take 2 g by mouth every morning.    . fluticasone (FLONASE) 50 MCG/ACT nasal spray Place 2 sprays into both nostrils daily. 16 g 6  . furosemide (LASIX) 20 MG tablet TAKE 1 TABLET BY MOUTH EVERY DAY 90 tablet 3  . glucosamine-chondroitin 500-400 MG tablet Take 1 tablet by mouth every morning.    . nortriptyline (PAMELOR) 10 MG capsule TAKE 1 CAPSULE BY MOUTH EVERY EVENING 90 capsule 3  . predniSONE (DELTASONE) 5 MG tablet TAKE 1 TABLET BY MOUTH EVERY DAY WITH BREAKFAST 30 tablet 1  . warfarin (COUMADIN) 3 MG tablet Take 1 tablet on Mon Wed and Friday or as directed by anticoagulation clinic. 40 tablet 1  . warfarin (COUMADIN) 6 MG tablet Take 1 tablet on Sun, Tues, Thurs and Sat or as directed by anticoagulation clinic. 50 tablet 1  . losartan (COZAAR) 50 MG tablet Take 1 tablet (50 mg total) by mouth daily. 30 tablet 11  . PROAIR HFA 108 (90 Base) MCG/ACT inhaler INHALE 2 PUFFS EVERY 6 HOURS AS NEEDED FOR WHEEZING/SHORTNESS OF BREATH (Patient not taking: Reported on 11/24/2018) 25.5 Inhaler 2  . triamcinolone cream (KENALOG) 0.5 % Apply 1 application topically 3 (three) times daily. (Patient not taking: Reported on 11/24/2018) 120 g 1  . warfarin (COUMADIN) 6 MG tablet  TAKE AS DIRECTED 90 tablet 3   No facility-administered medications prior to visit.     ROS: Review of Systems  Constitutional: Positive for unexpected weight change. Negative for appetite change and fatigue.  HENT: Negative for congestion, nosebleeds, sneezing, sore throat and trouble swallowing.   Eyes: Negative for itching and visual disturbance.  Respiratory: Negative for cough.   Cardiovascular: Positive for leg swelling. Negative for chest pain and palpitations.  Gastrointestinal: Negative for abdominal distention, blood in stool, diarrhea and nausea.  Genitourinary: Negative for frequency and hematuria.  Musculoskeletal: Positive for back pain. Negative for gait problem, joint swelling and neck pain.  Skin: Negative for rash.  Neurological: Negative for dizziness, tremors, speech difficulty and weakness.  Psychiatric/Behavioral: Negative for agitation, dysphoric mood, sleep disturbance and suicidal ideas. The patient is not nervous/anxious.     Objective:  BP (!) 166/84 (BP Location: Left Arm, Patient Position: Sitting, Cuff Size: Large)   Pulse 94   Temp 98.6 F (37 C) (Oral)   Ht 5\' 11"  (1.803 m)   Wt (!) 318 lb (144.2 kg)   SpO2 96%   BMI 44.35 kg/m   BP Readings from Last 3 Encounters:  03/21/19 (!) 166/84  11/24/18 (!) 175/116  10/13/18 (!) 148/90    Wt Readings from Last 3 Encounters:  03/21/19 (!) 318 lb (144.2 kg)  11/24/18 300 lb (136.1 kg)  10/13/18 300 lb (  136.1 kg)    Physical Exam Constitutional:      General: He is not in acute distress.    Appearance: He is well-developed. He is obese.     Comments: NAD  Eyes:     Conjunctiva/sclera: Conjunctivae normal.     Pupils: Pupils are equal, round, and reactive to light.  Neck:     Musculoskeletal: Normal range of motion.     Thyroid: No thyromegaly.     Vascular: No JVD.  Cardiovascular:     Rate and Rhythm: Normal rate and regular rhythm.     Heart sounds: Normal heart sounds. No murmur. No  friction rub. No gallop.   Pulmonary:     Effort: Pulmonary effort is normal. No respiratory distress.     Breath sounds: Normal breath sounds. No wheezing or rales.  Chest:     Chest wall: No tenderness.  Abdominal:     General: Bowel sounds are normal. There is no distension.     Palpations: Abdomen is soft. There is no mass.     Tenderness: There is no abdominal tenderness. There is no guarding or rebound.  Musculoskeletal: Normal range of motion.        General: No tenderness.     Right lower leg: Edema present.     Left lower leg: Edema present.  Lymphadenopathy:     Cervical: No cervical adenopathy.  Skin:    General: Skin is warm and dry.     Findings: No rash.  Neurological:     Mental Status: He is alert and oriented to person, place, and time.     Cranial Nerves: No cranial nerve deficit.     Motor: No abnormal muscle tone.     Coordination: Coordination normal.     Gait: Gait normal.     Deep Tendon Reflexes: Reflexes are normal and symmetric.  Psychiatric:        Behavior: Behavior normal.        Thought Content: Thought content normal.        Judgment: Judgment normal.    LS w/pain Edema B ankles R ear - dry blood  Lab Results  Component Value Date   WBC 7.2 02/05/2018   HGB 16.0 02/05/2018   HCT 46.9 02/05/2018   PLT 275.0 02/05/2018   GLUCOSE 94 10/28/2018   CHOL 217 (H) 02/05/2018   TRIG 181.0 (H) 02/05/2018   HDL 29.90 (L) 02/05/2018   LDLDIRECT 137.0 03/10/2017   LDLCALC 151 (H) 02/05/2018   ALT 21 02/05/2018   AST 22 02/05/2018   NA 138 10/28/2018   K 4.4 10/28/2018   CL 98 10/28/2018   CREATININE 0.92 10/28/2018   BUN 15 10/28/2018   CO2 24 10/28/2018   TSH 3.37 02/05/2018   PSA 0.14 03/10/2017   INR 3.8 (A) 03/04/2019   HGBA1C 5.3 04/19/2007    No results found.  Assessment & Plan:   There are no diagnoses linked to this encounter.   No orders of the defined types were placed in this encounter.    Follow-up: No follow-ups  on file.  Walker Kehr, MD

## 2019-03-21 NOTE — Assessment & Plan Note (Signed)
Labs

## 2019-03-21 NOTE — Patient Instructions (Addendum)
These suggestions will probably help you to improve your metabolism if you are not overweight and to lose weight if you are overweight: 1.  Reduce your consumption of sugars and starches.  Eliminate high fructose corn syrup from your diet.  Reduce your consumption of processed foods.  For desserts try to have seasonal fruits, berries, nuts, cheeses or dark chocolate with more than 70% cacao. 2.  Do not snack 3.  You do not have to eat breakfast.  If you choose to have breakfast-eat plain greek yogurt, eggs, oatmeal (without sugar) 4.  Drink water, freshly brewed unsweetened tea (green, black or herbal) or coffee.  Do not drink sodas including diet sodas , juices, beverages sweetened with artificial sweeteners. 5.  Reduce your consumption of refined grains. 6.  Avoid protein drinks such as Optifast, Slim fast etc. Eat chicken, fish, meat, dairy and beans for your sources of protein 7.  Natural unprocessed fats like cold pressed virgin olive oil, butter, coconut oil are good for you.  Eat avocados 8.  Increase your consumption of fiber.  Fruits, berries, vegetables, whole grains, flaxseeds, Chia seeds, beans, popcorn, nuts, oatmeal are good sources of fiber 9.  Use vinegar in your diet, i.e. apple cider vinegar, red wine or balsamic vinegar 10.  You can try fasting.  For example you can skip breakfast and lunch every other day (24-hour fast) 11.  Stress reduction, good night sleep, relaxation, meditation, yoga and other physical activity is likely to help you to maintain low weight too. 12.  If you drink alcohol, limit your alcohol intake to no more than 2 drinks a day.   Mediterranean diet is good for you. ([employer] has a typical Mediterranean cuisine menu) The Mediterranean diet is a way of eating based on the traditional cuisine of countries bordering the [place] Sea. While there is no single definition of the Mediterranean diet, it is typically high in vegetables, fruits, whole grains,  beans, nut and seeds, and olive oil. The main components of Mediterranean diet include: . Daily consumption of vegetables, fruits, whole grains and healthy fats  . Weekly intake of fish, poultry, beans and eggs  . Moderate portions of dairy products  . Limited intake of red meat Other important elements of the Mediterranean diet are sharing meals with family and friends, enjoying a glass of red wine and being physically active. Health benefits of a Mediterranean diet: A traditional Mediterranean diet consisting of large quantities of fresh fruits and vegetables, nuts, fish and olive oil-coupled with physical activity-can reduce your risk of serious mental and physical health problems by: Preventing heart disease and strokes. Following a Mediterranean diet limits your intake of refined breads, processed foods, and red meat, and encourages drinking red wine instead of hard liquor-all factors that can help prevent heart disease and stroke. Keeping you agile. If you're an older adult, the nutrients gained with a Mediterranean diet may reduce your risk of developing muscle weakness and other signs of frailty by about 70 percent. Reducing the risk of Alzheimer's. Research suggests that the Mediterranean diet may improve cholesterol, blood sugar levels, and overall blood vessel health, which in turn may reduce your risk of Alzheimer's disease or dementia. Halving the risk of Parkinson's disease. The high levels of antioxidants in the Mediterranean diet can prevent cells from undergoing a damaging process called oxidative stress, thereby cutting the risk of Parkinson's disease in half. Increasing longevity. By reducing your risk of developing heart disease or cancer with the Mediterranean diet,   you're reducing your risk of death at any age by 20%. Protecting against type 2 diabetes. A Mediterranean diet is rich in fiber which digests slowly, prevents huge swings in blood sugar, and can help you maintain a  healthy weight.    Cabbage soup recipe that will not make you gain weight: Take 1 small head of cabbage, 1 average pack of celery, 4 green peppers, 4 onions, 2 cans diced tomatoes (they are not available without salt), salt and spices to taste.  Chop cabbage, celery, peppers and onions.  And tomatoes and 2-2.5 liters (2.5 quarts) of water so that it would just cover the vegetables.  Bring to boil.  Add spices and salt.  Turn heat to low/medium and simmer for 20-25 minutes.  Naturally, you can make a smaller batch and change some of the ingredients.   If you have medicare related insurance (such as traditional Medicare, Blue Cross Medicare, United HealthCare Medicare, or similar), Please make an appointment at the scheduling desk with Jill, the Wellness Health Coach, for your Wellness visit in this office, which is a benefit with your insurance.  

## 2019-03-21 NOTE — Assessment & Plan Note (Signed)
Worse Diet discussed 2020 sciatica - can't do w/o prednisone.Marland KitchenMarland Kitchen

## 2019-03-21 NOTE — Assessment & Plan Note (Signed)
Coumadin 

## 2019-03-21 NOTE — Assessment & Plan Note (Signed)
Potential benefits of a long term opioids use as well as potential risks (i.e. addiction risk, apnea etc) and complications (i.e. Somnolence, constipation and others) were explained to the patient and were aknowledged.  on OxyIR now

## 2019-03-21 NOTE — Assessment & Plan Note (Addendum)
sciatica - can't do w/o prednisone...  Potential benefits of a long term opioids use as well as potential risks (i.e. addiction risk, apnea etc) and complications (i.e. Somnolence, constipation and others) were explained to the patient and were aknowledged. On OxyIR now

## 2019-03-21 NOTE — Assessment & Plan Note (Signed)
Needs a HHN and Albuterol

## 2019-03-21 NOTE — Assessment & Plan Note (Signed)
On B12 

## 2019-03-25 ENCOUNTER — Ambulatory Visit (INDEPENDENT_AMBULATORY_CARE_PROVIDER_SITE_OTHER): Payer: MEDICARE | Admitting: Cardiology

## 2019-03-25 ENCOUNTER — Other Ambulatory Visit: Payer: Self-pay

## 2019-03-25 ENCOUNTER — Encounter: Payer: Self-pay | Admitting: Cardiology

## 2019-03-25 VITALS — BP 155/82 | HR 98 | Ht 71.0 in | Wt 316.8 lb

## 2019-03-25 DIAGNOSIS — Z7189 Other specified counseling: Secondary | ICD-10-CM

## 2019-03-25 DIAGNOSIS — E785 Hyperlipidemia, unspecified: Secondary | ICD-10-CM | POA: Diagnosis not present

## 2019-03-25 DIAGNOSIS — Z86711 Personal history of pulmonary embolism: Secondary | ICD-10-CM

## 2019-03-25 DIAGNOSIS — Z86718 Personal history of other venous thrombosis and embolism: Secondary | ICD-10-CM | POA: Diagnosis not present

## 2019-03-25 DIAGNOSIS — I471 Supraventricular tachycardia, unspecified: Secondary | ICD-10-CM

## 2019-03-25 DIAGNOSIS — Z6841 Body Mass Index (BMI) 40.0 and over, adult: Secondary | ICD-10-CM | POA: Diagnosis not present

## 2019-03-25 DIAGNOSIS — I10 Essential (primary) hypertension: Secondary | ICD-10-CM | POA: Diagnosis not present

## 2019-03-25 DIAGNOSIS — Z7901 Long term (current) use of anticoagulants: Secondary | ICD-10-CM

## 2019-03-25 DIAGNOSIS — E66813 Obesity, class 3: Secondary | ICD-10-CM

## 2019-03-25 NOTE — Progress Notes (Signed)
Cardiology Office Note:    Date:  03/25/2019   ID:  Robert [name], DOB [birth year], MRN [id number]  PCP:  Cassandria Anger, MD  Cardiologist:  Buford Dresser, MD PhD (previously seen by Dr. Johnsie Cancel)  Referring MD: Cassandria Anger, MD   CC: follow up  History of Present Illness:    Robert [name] is a 68 y.o. male with a hx of prior DVT/PE, dyslipidemia, obesity, paroxysmal SVT who is seen in follow up at the request of [name], [name], MD for the evaluation and management of arrhythmia. He was previously seen by cardiology in 04/2014 by Dr. Johnsie Cancel. His initial consult with me was on 04/23/18. Please see that note for full review of his history.  Updated cardiac history: Cardiac monitor showed very frequent SVT events as well as a WCT event. Echo without major abnormalities. I recommended starting a low dose ditiazem to try to control the tachycardia, but he was very concerned about side effects and did not wish to start a medication. He saw Dr. Rayann Heman in EP who also reviewed monitor, discussed management options, recommended sleep study.  Today: Brings logs of BP and HR with him. Since going to 50 mg losartan he does feel intermittently lightheaded. Range 95/49-173/99, most 130s-140s/70s. Most heart rates 70s, does have occasional high readings. Feels like he feels very fatigued, nauseated, weak in the afternoon. Better if he sits down, drinks a soda.  We discussed options at length today. We reviewed the importance of good BP control as well as his known SVT. He has had issues with multiple medications in the past and does not wish to change his current regimen. He is aware that weight loss will help with BP control and take stress off of the heart, but his chronic conditions have made this difficult.  He is looking into non-mask treatments for sleep apnea at this time.  Denies chest pain, shortness of breath at rest or with normal exertion. No PND, orthopnea, LE  edema or unexpected weight gain. No syncope, has occasional palpitations.  Past Medical History:  Diagnosis Date  . Anxiety   . Asthma    "no attack since acupuncture in 1990's"  . Complication of anesthesia    SLOW TO WAKE UP / DIFFICULTY RESPONDING 2003, 3 days before could use left arm, "wild/crazy sometimes"  . Cyst of left kidney    "benign"  . DVT (deep venous thrombosis) ([place]) 2003   right femoral artery "from groin to knee"  . Edema    Right Leg w/ h/o DVT postphlebitic  . H/O blood transfusion reaction 2004   FFP, extreme swelling, could not breath  . Headache(784.0)    MIGRAINES-not for a long time  . History of pulmonary embolism 2003   both lungs  . Hyperlipidemia   . Knee pain    left  . LBP (low back pain)   . 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 Obesity   . Osteoarthritis   . Peripheral vascular disease ([place])    "poor circulation in  RT leg"  . PONV (postoperative nausea and vomiting)    during surgery in 1990's  . Spondylolisthesis   . Warfarin anticoagulation   . Wheezing    when laying on left side    Past Surgical History:  Procedure Laterality Date  . APPENDECTOMY  1993   Dr Margot Chimes  . CHOLECYSTECTOMY  1993  . FINGER SURGERY  2012   RT THUMB  RECONSTRUCTION  . HAND SURGERY Right    abcess  . HEMORROIDECTOMY  1981  . I&D KNEE WITH POLY EXCHANGE Left 04/04/2013   Procedure: IRRIGATION AND DEBRIDEMENT KNEE WITH POLY EXCHANGE AND INSERTION OF CEMENT BEADS;  Surgeon: Gearlean Alf, MD;  Location: [facility];  Service: Orthopedics;  Laterality: Left;  . KNEE ARTHROSCOPY  1990   RT KNEE  . KNEE ARTHROSCOPY Right 2003  . KNEE ARTHROSCOPY  1988   L KNEE  . KNEE ARTHROSCOPY WITH MENISCAL REPAIR Left 10/22/2012   Procedure: LEFT KNEE ARTHROSCOPY PARTIAL MEDIAL AND LATERAL MENISCECTOMY AND DEBRIDEMENT, CHONDROPLASTY ;  Surgeon: Johnn Hai, MD;  Location: [facility];  Service: Orthopedics;  Laterality: Left;  . LUMBAR DISC SURGERY    .  LUMBAR FUSION  2003   Dr Maxie Better  . ORCHIECTOMY  1994   R post injury  . TONSILLECTOMY  1967  . TOTAL KNEE ARTHROPLASTY Left 03/21/2013   Procedure: LEFT TOTAL KNEE ARTHROPLASTY;  Surgeon: Gearlean Alf, MD;  Location: [facility];  Service: Orthopedics;  Laterality: Left;    Current Medications: Current Outpatient Medications on File Prior to Visit  Medication Sig  . albuterol (PROVENTIL) (2.5 MG/3ML) 0.083% nebulizer solution Take 3 mLs (2.5 mg total) by nebulization every 6 (six) hours as needed for wheezing or shortness of breath. Dx  J45.909  . Cyanocobalamin (VITAMIN B-12) 5000 MCG SUBL Place 5,000 tablets under the tongue every morning.  . fish oil-omega-3 fatty acids 1000 MG capsule Take 2 g by mouth every morning.  . fluticasone (FLONASE) 50 MCG/ACT nasal spray Place 2 sprays into both nostrils daily.  . furosemide (LASIX) 20 MG tablet TAKE 1 TABLET BY MOUTH EVERY DAY  . glucosamine-chondroitin 500-400 MG tablet Take 1 tablet by mouth every morning.  . nortriptyline (PAMELOR) 10 MG capsule TAKE 1 CAPSULE BY MOUTH EVERY EVENING  . predniSONE (DELTASONE) 5 MG tablet TAKE 1 TABLET BY MOUTH EVERY DAY WITH BREAKFAST  . warfarin (COUMADIN) 3 MG tablet Take 1 tablet on Mon Wed and Friday or as directed by anticoagulation clinic.  [name] warfarin (COUMADIN) 6 MG tablet Take 1 tablet on Sun, Tues, Thurs and Sat or as directed by anticoagulation clinic.  [name] losartan (COZAAR) 50 MG tablet Take 1 tablet (50 mg total) by mouth daily.   No current facility-administered medications on file prior to visit.      Allergies:   Cat hair extract, Diltiazem, Sulfa antibiotics, Anoro ellipta [umeclidinium-vilanterol], Breo ellipta [fluticasone furoate-vilanterol], Exalgo [hydromorphone hcl], Fentanyl, Gabapentin, Hydrocodone-acetaminophen, Lopressor [metoprolol tartrate], Penicillins, Sulfonamide derivatives, and Tylenol [acetaminophen]   Social History   Socioeconomic History  . Marital status: Married     Spouse name: Not on file  . Number of children: 3  . Years of education: Not on file  . Highest education level: Not on file  Occupational History  . Occupation: Disabled    Employer: DISABLED  Social Needs  . Financial resource strain: Not on file  . Food insecurity    Worry: Not on file    Inability: Not on file  . Transportation needs    Medical: Not on file    Non-medical: Not on file  Tobacco Use  . Smoking status: Never Smoker  . Smokeless tobacco: Never Used  Substance and Sexual Activity  . Alcohol use: No  . Drug use: No  . Sexual activity: Yes  Lifestyle  . Physical activity    Days per week: Not on file    Minutes per  session: Not on file  . Stress: Not on file  Relationships  . Social Herbalist on phone: Not on file    Gets together: Not on file    Attends religious service: Not on file    Active member of club or organization: Not on file    Attends meetings of clubs or organizations: Not on file    Relationship status: Not on file  Other Topics Concern  . Not on file  Social History Narrative  . Not on file     Family History: The patient's family history includes Heart disease in his mother and sister; Hyperlipidemia in an other family member; Hypertension in an other family member; Parkinsonism in an other family member. son passed away with hard heartbeats. Mother had 5V CABG about 10 years ago, had to have a pacemaker placed.  ROS:   Please see the history of present illness.  Additional pertinent ROS: Constitutional: Negative for chills, fever, night sweats, unintentional weight loss  HENT: Negative for ear pain and hearing loss.   Eyes: Negative for loss of vision and eye pain.  Respiratory: Negative for cough, sputum, wheezing.   Cardiovascular: See HPI. Gastrointestinal: Negative for abdominal pain, melena, and hematochezia.  Genitourinary: Negative for dysuria and hematuria.  Musculoskeletal: Negative for falls and myalgias.   Skin: Negative for itching and rash.  Neurological: Negative for focal weakness, focal sensory changes and loss of consciousness.  Endo/Heme/Allergies: Does not bruise/bleed easily.    EKGs/Labs/Other Studies Reviewed:    The following studies were reviewed today: Echo 05/06/18 - Left ventricle: The cavity size was normal. Systolic function was   normal. The estimated ejection fraction was in the range of 60%   to 65%. Wall motion was normal; there were no regional wall   motion abnormalities. - Aortic valve: There was very mild stenosis. Valve area (VTI):   1.67 cm^2. Valve area (Vmax): 1.85 cm^2. Valve area (Vmean): 1.59   cm^2. - Mitral valve: There was mild regurgitation. - Left atrium: The atrium was mildly to moderately dilated.  Impressions: - Left ventricular systolic function is preserved visually   estimated ejection fraction 60 to 65%.   Mild mitral regurgitation.   Mild to moderate left atrial enlargement   Very mild aortic stenosis.  Monitor 05/27/18 Patient had a min HR of 53 bpm, max HR of 214 bpm, and avg HR of 89 bpm. Predominant underlying rhythm was Sinus Rhythm. There were 9 patient triggered events, and Supraventricular Tachycardia was detected within +/- 45 seconds of symptomatic patient event(s).   Monitor noted 6 episodes of wide complex tachycardia; these are labeled as VT but are different morphology from the majority of his PVCs and bigeminy/trigeminy. Could be VT vs. SVT with aberrancy, given that these events occurred at a faster heart rate than his predominant SVT. The fastest WCT lasted 15 beats with a max rate of 197 bpm (avg 164 bpm); the run with the fastest interval was also the longest.   4752 narrow complex Supraventricular Tachycardia runs occurred, the run with the fastest interval lasting 1 min 37 secs with a max rate of 214 bpm, the longest lasting 56 mins 49 secs with an avg rate of 126 bpm. Some episodes of Supraventricular Tachycardia  conducted with possible aberrancy. Isolated SVEs were frequent (10.9%, [id number]), SVE Couplets were rare (<1.0%, 7844), and SVE Triplets were rare (<1.0%, 793). Isolated VEs were occasional (2.1%, [id number]), VE Couplets were rare (<1.0%, 338), and VE Triplets were  rare (<1.0%, 5). Ventricular Bigeminy and Trigeminy were present.  Echo stress 03/27/11 - Stress ECG conclusions: The stress ECG was normal. - Staged echo: There was no echocardiographic evidence for stress-induced ischemia. Impressions:  - Stress echo with chest tightness but no ST changes; no stress-induced wall motion abnormalities  EKG:  ECG personally reviewed. The ekg ordered today demonstrates sinus rhythm with frequent PACs  Recent Labs: 10/28/2018: BUN 15; Creatinine, Ser 0.92; Potassium 4.4; Sodium 138  Recent Lipid Panel    Component Value Date/Time   CHOL 217 (H) 02/05/2018 0755   TRIG 181.0 (H) 02/05/2018 0755   TRIG 118 04/30/2006 0841   HDL 29.90 (L) 02/05/2018 0755   CHOLHDL 7 02/05/2018 0755   VLDL 36.2 02/05/2018 0755   LDLCALC 151 (H) 02/05/2018 0755   LDLDIRECT 137.0 03/10/2017 0842    Physical Exam:    VS:  BP (!) 155/82   Pulse 98   Ht 5\' 11"  (1.803 m)   Wt (!) 316 lb 12.8 oz (143.7 kg)   SpO2 95%   BMI 44.18 kg/m     Wt Readings from Last 3 Encounters:  03/25/19 (!) 316 lb 12.8 oz (143.7 kg)  03/21/19 (!) 318 lb (144.2 kg)  11/24/18 300 lb (136.1 kg)    GEN: Well nourished, well developed in no acute distress HEENT: Normal, moist mucous membranes NECK: No JVD CARDIAC: regular rhythm with frequent premature beats, normal S1 and S2, no rubs, gallops. 1/6 SEM at RUSB, early peaking/preserved S2 VASCULAR: Radial and DP pulses 2+ bilaterally. No carotid bruits RESPIRATORY:  Clear to auscultation without rales, wheezing or rhonchi  ABDOMEN: Soft, non-tender, non-distended MUSCULOSKELETAL:  Ambulates independently SKIN: Warm and dry, RLE with compression stocking in place. LLE with trace edema  NEUROLOGIC:  Alert and oriented x 3. No focal neuro deficits noted. PSYCHIATRIC:  Normal affect   ASSESSMENT:    1. Essential hypertension   2. Paroxysmal supraventricular tachycardia ([place])   3. Educated about COVID-19 virus infection   4. Long term current use of anticoagulant   5. History of pulmonary embolism   6. History of DVT of lower extremity   7. Dyslipidemia   8. Class 3 severe obesity due to excess calories with serious comorbidity and body mass index (BMI) of 40.0 to 44.9 in adult [facility])   9. Cardiac risk counseling   10. Counseling on health promotion and disease prevention    PLAN:    Hypertension: above goal today. Reports feeling intermittently poorly -tolerating losartan 50 mg daily. Discussed dose change today, but he feels a dose change is likely to make him feel worse, declines -continue to check home BP -BMET 10/2018 Cr 0.92, K 4.4 -goal <130/80 -he will call if his BP significantly changes  Paroxysmal SVT, with concern for bradycardia/heart block as well:has had monitor, echo, EP referral.  -noted on BP logs -working on exercise/weight loss -has declined sleep study in the past. Interested in alternative treatment with electrodes instead of mask, he is investigating this -ECG with frequent PACs today -not interested in medications/procedures at this time  History of DVT/PE with chronic venous insufficiency, on long term anticoagulation with coumadin: No changes, not interested in [place], continue coumadin therapy.  -Uses compression stocking for insufficiency  Dyslipidemia: last LDL 151, TG 181. We have discussed at length options for management. He prefers to stay on OTC fish oil. Has significantly elevated ASCVD risk, below  Obesity: BMI 44. Aware that weight loss would be beneficial, but he feels very  limited in his ability to do this due to his chronic medical conditions.  -recommend heart healthy/Mediterranean diet, with whole grains, fruits,  vegetable, fish, lean meats, nuts, and olive oil. Limit salt. -recommend moderate walking, 3-5 times/week for 30-50 minutes each session. Aim for at least 150 minutes.week. Goal should be pace of 3 miles/hours, or walking 1.5 miles in 30 minutes -recommend avoidance of tobacco products. Avoid excess alcohol. -ASCVD risk score: The 10-year ASCVD risk score Mikey Bussing DC Brooke Bonito., et al., 2013) is: 30.9%   Values used to calculate the score:     Age: 62 years     Sex: Male     Is Non-Hispanic African American: No     Diabetic: No     Tobacco smoker: No     Systolic Blood Pressure: 99991111 mmHg     Is BP treated: Yes     HDL Cholesterol: 29.9 mg/dL     Total Cholesterol: 217 mg/dL   Plan for follow up: 1 year or sooner PRN  Medication Adjustments/Labs and Tests Ordered: Current medicines are reviewed at length with the patient today.  Concerns regarding medicines are outlined above.  Orders Placed This Encounter  Procedures  . EKG 12-Lead   No orders of the defined types were placed in this encounter.   Patient Instructions  Medication Instructions:  Your Physician recommend you continue on your current medication as directed.    If you need a refill on your cardiac medications before your next appointment, please call your pharmacy.   Lab work: None  Testing/Procedures: None  Follow-Up: At [facility], you and your health needs are our priority.  As part of our continuing mission to provide you with exceptional heart care, we have created designated Provider Care Teams.  These Care Teams include your primary Cardiologist (physician) and Advanced Practice Providers (APPs -  Physician Assistants and Nurse Practitioners) who all work together to provide you with the care you need, when you need it. You will need a follow up appointment in 1 years.  Please call our office 2 months in advance to schedule this appointment.  You may see Dr. Harrell Gave or one of the following Advanced Practice  Providers on your designated Care Team:   Rosaria Ferries, PA-C . [name], DNP, ANP        Signed, Buford Dresser, MD PhD 03/25/2019    [facility]

## 2019-03-25 NOTE — Patient Instructions (Signed)

## 2019-03-27 ENCOUNTER — Encounter: Payer: Self-pay | Admitting: Cardiology

## 2019-03-29 ENCOUNTER — Other Ambulatory Visit: Payer: Self-pay | Admitting: General Practice

## 2019-03-30 ENCOUNTER — Other Ambulatory Visit: Payer: Self-pay

## 2019-03-30 ENCOUNTER — Other Ambulatory Visit (INDEPENDENT_AMBULATORY_CARE_PROVIDER_SITE_OTHER): Payer: MEDICARE

## 2019-03-30 ENCOUNTER — Ambulatory Visit (INDEPENDENT_AMBULATORY_CARE_PROVIDER_SITE_OTHER)
Admission: RE | Admit: 2019-03-30 | Discharge: 2019-03-30 | Disposition: A | Discharge status: Home / Self Care | Payer: MEDICARE | Source: Ambulatory Visit | Attending: Internal Medicine | Admitting: Internal Medicine

## 2019-03-30 DIAGNOSIS — E785 Hyperlipidemia, unspecified: Secondary | ICD-10-CM | POA: Diagnosis not present

## 2019-03-30 DIAGNOSIS — R945 Abnormal results of liver function studies: Secondary | ICD-10-CM | POA: Diagnosis not present

## 2019-03-30 DIAGNOSIS — Z7901 Long term (current) use of anticoagulants: Secondary | ICD-10-CM

## 2019-03-30 DIAGNOSIS — R7989 Other specified abnormal findings of blood chemistry: Secondary | ICD-10-CM

## 2019-03-30 DIAGNOSIS — R519 Headache, unspecified: Secondary | ICD-10-CM | POA: Diagnosis not present

## 2019-03-30 DIAGNOSIS — Z125 Encounter for screening for malignant neoplasm of prostate: Secondary | ICD-10-CM | POA: Diagnosis not present

## 2019-03-30 DIAGNOSIS — G4489 Other headache syndrome: Secondary | ICD-10-CM | POA: Diagnosis not present

## 2019-03-30 LAB — HEPATIC FUNCTION PANEL
ALT: 20 U/L (ref 0–53)
AST: 20 U/L (ref 0–37)
Albumin: 3.9 g/dL (ref 3.5–5.2)
Alkaline Phosphatase: 57 U/L (ref 39–117)
Bilirubin, Direct: 0.1 mg/dL (ref 0.0–0.3)
Total Bilirubin: 0.8 mg/dL (ref 0.2–1.2)
Total Protein: 7.2 g/dL (ref 6.0–8.3)

## 2019-03-30 LAB — CBC WITH DIFFERENTIAL/PLATELET
Basophils Absolute: 0.1 10*3/uL (ref 0.0–0.1)
Basophils Relative: 1.1 % (ref 0.0–3.0)
Eosinophils Absolute: 0.7 10*3/uL (ref 0.0–0.7)
Eosinophils Relative: 8.6 % — ABNORMAL HIGH (ref 0.0–5.0)
HCT: 44.8 % (ref 39.0–52.0)
Hemoglobin: 15.4 g/dL (ref 13.0–17.0)
Lymphocytes Relative: 35.8 % (ref 12.0–46.0)
Lymphs Abs: 2.9 10*3/uL (ref 0.7–4.0)
MCHC: 34.3 g/dL (ref 30.0–36.0)
MCV: 96.5 fl (ref 78.0–100.0)
Monocytes Absolute: 0.9 10*3/uL (ref 0.1–1.0)
Monocytes Relative: 10.8 % (ref 3.0–12.0)
Neutro Abs: 3.5 10*3/uL (ref 1.4–7.7)
Neutrophils Relative %: 43.7 % (ref 43.0–77.0)
Platelets: 273 10*3/uL (ref 150.0–400.0)
RBC: 4.64 Mil/uL (ref 4.22–5.81)
RDW: 13.2 % (ref 11.5–15.5)
WBC: 8 10*3/uL (ref 4.0–10.5)

## 2019-03-30 LAB — URINALYSIS
Bilirubin Urine: NEGATIVE
Ketones, ur: NEGATIVE
Leukocytes,Ua: NEGATIVE
Nitrite: NEGATIVE
Specific Gravity, Urine: 1.025 (ref 1.000–1.030)
Urine Glucose: NEGATIVE
Urobilinogen, UA: 0.2 (ref 0.0–1.0)
pH: 6 (ref 5.0–8.0)

## 2019-03-30 LAB — LIPID PANEL
Cholesterol: 208 mg/dL — ABNORMAL HIGH (ref 0–200)
HDL: 34.4 mg/dL — ABNORMAL LOW (ref >39.00)
LDL Cholesterol: 141 mg/dL — ABNORMAL HIGH (ref 0–99)
NonHDL: 173.77
Total CHOL/HDL Ratio: 6
Triglycerides: 165 mg/dL — ABNORMAL HIGH (ref 0.0–149.0)
VLDL: 33 mg/dL (ref 0.0–40.0)

## 2019-03-30 LAB — PROTIME-INR
INR: 2.2 ratio — ABNORMAL HIGH (ref 0.8–1.0)
Prothrombin Time: 25.3 s — ABNORMAL HIGH (ref 9.6–13.1)

## 2019-03-30 LAB — BASIC METABOLIC PANEL
BUN: 16 mg/dL (ref 6–23)
CO2: 30 mEq/L (ref 19–32)
Calcium: 9.4 mg/dL (ref 8.4–10.5)
Chloride: 102 mEq/L (ref 96–112)
Creatinine, Ser: 1.05 mg/dL (ref 0.40–1.50)
GFR: 70.34 mL/min (ref >60.00)
Glucose, Bld: 104 mg/dL — ABNORMAL HIGH (ref 70–99)
Potassium: 5 mEq/L (ref 3.5–5.1)
Sodium: 140 mEq/L (ref 135–145)

## 2019-03-30 LAB — TSH: TSH: 3.98 u[IU]/mL (ref 0.35–4.50)

## 2019-03-30 LAB — PSA: PSA: 0.09 ng/mL — ABNORMAL LOW (ref 0.10–4.00)

## 2019-03-31 ENCOUNTER — Ambulatory Visit (INDEPENDENT_AMBULATORY_CARE_PROVIDER_SITE_OTHER): Payer: MEDICARE | Admitting: General Practice

## 2019-03-31 DIAGNOSIS — Z7901 Long term (current) use of anticoagulants: Secondary | ICD-10-CM | POA: Diagnosis not present

## 2019-03-31 DIAGNOSIS — Z86711 Personal history of pulmonary embolism: Secondary | ICD-10-CM

## 2019-03-31 NOTE — Patient Instructions (Signed)
Pre visit review using our clinic review tool, if applicable. No additional management support is needed unless otherwise documented below in the visit note.  Continue to take 6 mg all days except 3 mg on Mon Wed and Fridays.  Re-check in 4 weeks. 

## 2019-04-01 ENCOUNTER — Ambulatory Visit: Payer: MEDICARE

## 2019-04-08 ENCOUNTER — Other Ambulatory Visit: Payer: Self-pay | Admitting: Internal Medicine

## 2019-04-08 MED ORDER — VALACYCLOVIR HCL 1 G PO TABS: 1000.0000 mg | ORAL_TABLET | Freq: Three times a day (TID) | ORAL | 0 refills | Status: DC

## 2019-05-06 ENCOUNTER — Ambulatory Visit (INDEPENDENT_AMBULATORY_CARE_PROVIDER_SITE_OTHER): Payer: MEDICARE | Admitting: General Practice

## 2019-05-06 ENCOUNTER — Other Ambulatory Visit: Payer: Self-pay

## 2019-05-06 DIAGNOSIS — Z7901 Long term (current) use of anticoagulants: Secondary | ICD-10-CM

## 2019-05-06 DIAGNOSIS — Z86711 Personal history of pulmonary embolism: Secondary | ICD-10-CM

## 2019-05-06 LAB — POCT INR: INR: 1.9 — AB (ref 2.0–3.0)

## 2019-05-06 NOTE — Progress Notes (Signed)
Medical screening examination/treatment/procedure(s) were performed by non-physician practitioner and as supervising physician I was immediately available for consultation/collaboration. I agree with above. Shyvonne Chastang, MD   

## 2019-05-06 NOTE — Patient Instructions (Addendum)
Pre visit review using our clinic review tool, if applicable. No additional management support is needed unless otherwise documented below in the visit note.  Take 6 mg today (11/13) and then continue to take 6 mg all days except 3 mg on Mon Wed and Fridays.  Re-check in 4 weeks.

## 2019-05-09 ENCOUNTER — Other Ambulatory Visit: Payer: Self-pay | Admitting: Internal Medicine

## 2019-05-18 ENCOUNTER — Other Ambulatory Visit: Payer: Self-pay

## 2019-05-30 ENCOUNTER — Other Ambulatory Visit: Payer: Self-pay | Admitting: Internal Medicine

## 2019-05-30 MED ORDER — PREDNISONE 5 MG PO TABS: ORAL_TABLET | ORAL | 3 refills | Status: DC

## 2019-06-03 ENCOUNTER — Ambulatory Visit: Payer: MEDICARE

## 2019-06-07 ENCOUNTER — Ambulatory Visit (INDEPENDENT_AMBULATORY_CARE_PROVIDER_SITE_OTHER): Payer: MEDICARE | Admitting: General Practice

## 2019-06-07 ENCOUNTER — Other Ambulatory Visit: Payer: Self-pay

## 2019-06-07 DIAGNOSIS — Z7901 Long term (current) use of anticoagulants: Secondary | ICD-10-CM

## 2019-06-07 DIAGNOSIS — Z86711 Personal history of pulmonary embolism: Secondary | ICD-10-CM

## 2019-06-07 LAB — POCT INR: INR: 2.7 (ref 2.0–3.0)

## 2019-06-07 NOTE — Patient Instructions (Signed)
Pre visit review using our clinic review tool, if applicable. No additional management support is needed unless otherwise documented below in the visit note.  Continue to take 6 mg all days except 3 mg on Mon Wed and Fridays.  Re-check in 6 weeks.  

## 2019-06-23 ENCOUNTER — Encounter: Payer: Self-pay | Admitting: Internal Medicine

## 2019-06-23 ENCOUNTER — Ambulatory Visit (INDEPENDENT_AMBULATORY_CARE_PROVIDER_SITE_OTHER): Payer: MEDICARE | Admitting: Internal Medicine

## 2019-06-23 ENCOUNTER — Other Ambulatory Visit: Payer: Self-pay

## 2019-06-23 DIAGNOSIS — J0101 Acute recurrent maxillary sinusitis: Secondary | ICD-10-CM

## 2019-06-23 DIAGNOSIS — G8929 Other chronic pain: Secondary | ICD-10-CM

## 2019-06-23 DIAGNOSIS — M545 Low back pain, unspecified: Secondary | ICD-10-CM

## 2019-06-23 DIAGNOSIS — E538 Deficiency of other specified B group vitamins: Secondary | ICD-10-CM | POA: Diagnosis not present

## 2019-06-23 DIAGNOSIS — Z6841 Body Mass Index (BMI) 40.0 and over, adult: Secondary | ICD-10-CM

## 2019-06-23 MED ORDER — LEVOFLOXACIN 500 MG PO TABS: 500.0000 mg | ORAL_TABLET | Freq: Every day | ORAL | 0 refills | Status: DC

## 2019-06-23 NOTE — Progress Notes (Signed)
Subjective:  Patient ID: Robert [name], male    DOB: 01-22-1951  Age: 68 y.o. MRN: [id number]  CC: No chief complaint on file.   HPI Robert [name] presents for sinusitis, pain in the L side of his mouth - burning pain in the mouth - worse. Brushing teeth - no pain or problem.  Follow-up on low back pain, obesity, weight gain  Outpatient Medications Prior to Visit  Medication Sig Dispense Refill  . albuterol (PROVENTIL) (2.5 MG/3ML) 0.083% nebulizer solution Take 3 mLs (2.5 mg total) by nebulization every 6 (six) hours as needed for wheezing or shortness of breath. Dx  J45.909 150 mL 3  . Cyanocobalamin (VITAMIN B-12) 5000 MCG SUBL Place 5,000 tablets under the tongue every morning.    . fish oil-omega-3 fatty acids 1000 MG capsule Take 2 g by mouth every morning.    . fluticasone (FLONASE) 50 MCG/ACT nasal spray Place 2 sprays into both nostrils daily. 16 g 6  . furosemide (LASIX) 20 MG tablet TAKE 1 TABLET BY MOUTH EVERY DAY 90 tablet 3  . glucosamine-chondroitin 500-400 MG tablet Take 1 tablet by mouth every morning.    . nortriptyline (PAMELOR) 10 MG capsule TAKE 1 CAPSULE BY MOUTH EVERY EVENING 90 capsule 3  . predniSONE (DELTASONE) 5 MG tablet TAKE 1 TABLET BY MOUTH EVERY DAY WITH BREAKFAST 30 tablet 3  . warfarin (COUMADIN) 3 MG tablet Take 1 tablet on Mon Wed and Friday or as directed by anticoagulation clinic. 40 tablet 1  . warfarin (COUMADIN) 6 MG tablet Take 1 tablet on Sun, Tues, Thurs and Sat or as directed by anticoagulation clinic. 50 tablet 1  . losartan (COZAAR) 50 MG tablet Take 1 tablet (50 mg total) by mouth daily. 30 tablet 11  . valACYclovir (VALTREX) 1000 MG tablet Take 1 tablet (1,000 mg total) by mouth 3 (three) times daily. 30 tablet 0   No facility-administered medications prior to visit.    ROS: Review of Systems  Constitutional: Negative for appetite change, fatigue and unexpected weight change.  HENT: Positive for dental problem, sinus pressure  and sinus pain. Negative for congestion, nosebleeds, sneezing, sore throat and trouble swallowing.   Eyes: Negative for itching and visual disturbance.  Respiratory: Negative for cough.   Cardiovascular: Negative for chest pain, palpitations and leg swelling.  Gastrointestinal: Negative for abdominal distention, blood in stool, diarrhea and nausea.  Genitourinary: Negative for frequency and hematuria.  Musculoskeletal: Positive for back pain. Negative for gait problem, joint swelling and neck pain.  Skin: Negative for rash.  Neurological: Negative for dizziness, tremors, speech difficulty and weakness.  Psychiatric/Behavioral: Negative for agitation, dysphoric mood and sleep disturbance. The patient is not nervous/anxious.     Objective:  BP (!) 148/84 (BP Location: Left Arm, Patient Position: Sitting, Cuff Size: Large)   Pulse 74   Temp 98.2 F (36.8 C) (Oral)   Ht 5\' 11"  (1.803 m)   Wt (!) 330 lb 6.4 oz (149.9 kg)   SpO2 98%   BMI 46.08 kg/m   BP Readings from Last 3 Encounters:  06/23/19 (!) 148/84  03/25/19 (!) 155/82  03/21/19 (!) 166/84    Wt Readings from Last 3 Encounters:  06/23/19 (!) 330 lb 6.4 oz (149.9 kg)  03/25/19 (!) 316 lb 12.8 oz (143.7 kg)  03/21/19 (!) 318 lb (144.2 kg)    Physical Exam Constitutional:      General: He is not in acute distress.    Appearance: He is well-developed. He  is obese.     Comments: NAD  Eyes:     Conjunctiva/sclera: Conjunctivae normal.     Pupils: Pupils are equal, round, and reactive to light.  Neck:     Thyroid: No thyromegaly.     Vascular: No JVD.  Cardiovascular:     Rate and Rhythm: Normal rate and regular rhythm.     Heart sounds: Normal heart sounds. No murmur. No friction rub. No gallop.   Pulmonary:     Effort: Pulmonary effort is normal. No respiratory distress.     Breath sounds: Normal breath sounds. No wheezing or rales.  Chest:     Chest wall: No tenderness.  Abdominal:     General: Bowel sounds are  normal. There is no distension.     Palpations: Abdomen is soft. There is no mass.     Tenderness: There is no abdominal tenderness. There is no guarding or rebound.  Musculoskeletal:        General: Tenderness present. Normal range of motion.     Cervical back: Normal range of motion.  Lymphadenopathy:     Cervical: No cervical adenopathy.  Skin:    General: Skin is warm and dry.     Findings: No rash.  Neurological:     Mental Status: He is alert and oriented to person, place, and time.     Cranial Nerves: No cranial nerve deficit.     Motor: No abnormal muscle tone.     Coordination: Coordination normal.     Gait: Gait normal.     Deep Tendon Reflexes: Reflexes are normal and symmetric.  Psychiatric:        Behavior: Behavior normal.        Thought Content: Thought content normal.        Judgment: Judgment normal.     Lab Results  Component Value Date   WBC 8.0 03/30/2019   HGB 15.4 03/30/2019   HCT 44.8 03/30/2019   PLT 273.0 03/30/2019   GLUCOSE 104 (H) 03/30/2019   CHOL 208 (H) 03/30/2019   TRIG 165.0 (H) 03/30/2019   HDL 34.40 (L) 03/30/2019   LDLDIRECT 137.0 03/10/2017   LDLCALC 141 (H) 03/30/2019   ALT 20 03/30/2019   AST 20 03/30/2019   NA 140 03/30/2019   K 5.0 03/30/2019   CL 102 03/30/2019   CREATININE 1.05 03/30/2019   BUN 16 03/30/2019   CO2 30 03/30/2019   TSH 3.98 03/30/2019   PSA 0.09 (L) 03/30/2019   INR 2.7 06/07/2019   HGBA1C 5.3 04/19/2007    CT Head Wo Contrast  Result Date: 03/30/2019 CLINICAL DATA:  Right-sided headache for 1 month EXAM: CT HEAD WITHOUT CONTRAST TECHNIQUE: Contiguous axial images were obtained from the base of the skull through the vertex without intravenous contrast. COMPARISON:  None. FINDINGS: Brain: No evidence of acute infarction, hemorrhage, hydrocephalus, extra-axial collection or mass lesion/mass effect. Vascular: No hyperdense vessel or unexpected calcification. Skull: Normal. Negative for fracture or focal  lesion. Sinuses/Orbits: No acute finding. Other: None. IMPRESSION: No acute intracranial pathology. No non-contrast CT findings to explain right-sided headache. Electronically Signed   By: Eddie Candle M.D.   On: 03/30/2019 14:09    Assessment & Plan:     Follow-up: No follow-ups on file.  Walker Kehr, MD

## 2019-06-23 NOTE — Assessment & Plan Note (Signed)
° 

## 2019-06-23 NOTE — Patient Instructions (Signed)
  These suggestions will probably help you to improve your metabolism if you are not overweight and to lose weight if you are overweight: 1.  Reduce your consumption of sugars and starches.  Eliminate high fructose corn syrup from your diet.  Reduce your consumption of processed foods.  For desserts try to have seasonal fruits, berries, nuts, cheeses or dark chocolate with more than 70% cacao. 2.  Do not snack 3.  You do not have to eat breakfast.  If you choose to have breakfast-eat plain greek yogurt, eggs, oatmeal (without sugar) 4.  Drink water, freshly brewed unsweetened tea (green, black or herbal) or coffee.  Do not drink sodas including diet sodas , juices, beverages sweetened with artificial sweeteners. 5.  Reduce your consumption of refined grains. 6.  Avoid protein drinks such as Optifast, Slim fast etc. Eat chicken, fish, meat, dairy and beans for your sources of protein 7.  Natural unprocessed fats like cold pressed virgin olive oil, butter, coconut oil are good for you.  Eat avocados 8.  Increase your consumption of fiber.  Fruits, berries, vegetables, whole grains, flaxseeds, Chia seeds, beans, popcorn, nuts, oatmeal are good sources of fiber 9.  Use vinegar in your diet, i.e. apple cider vinegar, red wine or balsamic vinegar 10.  You can try fasting.  For example you can skip breakfast and lunch every other day (24-hour fast) 11.  Stress reduction, good night sleep, relaxation, meditation, yoga and other physical activity is likely to help you to maintain low weight too. 12.  If you drink alcohol, limit your alcohol intake to no more than 2 drinks a day.   

## 2019-06-23 NOTE — Assessment & Plan Note (Signed)
Wt Readings from Last 3 Encounters:  06/23/19 (!) 330 lb 6.4 oz (149.9 kg)  03/25/19 (!) 316 lb 12.8 oz (143.7 kg)  03/21/19 (!) 318 lb (144.2 kg)

## 2019-06-23 NOTE — Assessment & Plan Note (Signed)
Levaquin po

## 2019-06-23 NOTE — Assessment & Plan Note (Signed)
On B12 

## 2019-06-26 ENCOUNTER — Encounter: Payer: Self-pay | Admitting: Internal Medicine

## 2019-06-30 ENCOUNTER — Other Ambulatory Visit: Payer: Self-pay | Admitting: Internal Medicine

## 2019-06-30 DIAGNOSIS — Z7901 Long term (current) use of anticoagulants: Secondary | ICD-10-CM

## 2019-07-04 ENCOUNTER — Other Ambulatory Visit: Payer: Self-pay | Admitting: Internal Medicine

## 2019-07-04 MED ORDER — PREDNISONE 5 MG PO TABS: ORAL_TABLET | ORAL | 1 refills | Status: DC

## 2019-07-07 ENCOUNTER — Other Ambulatory Visit: Payer: Self-pay

## 2019-07-07 MED ORDER — LOSARTAN POTASSIUM 50 MG PO TABS: 50.0000 mg | ORAL_TABLET | Freq: Every day | ORAL | 3 refills | Status: DC

## 2019-07-12 ENCOUNTER — Other Ambulatory Visit: Payer: Self-pay | Admitting: General Practice

## 2019-07-12 DIAGNOSIS — Z7901 Long term (current) use of anticoagulants: Secondary | ICD-10-CM

## 2019-07-12 MED ORDER — PREDNISONE 5 MG PO TABS: ORAL_TABLET | ORAL | 1 refills | Status: DC

## 2019-07-12 MED ORDER — WARFARIN SODIUM 3 MG PO TABS: ORAL_TABLET | ORAL | 1 refills | Status: DC

## 2019-07-12 MED ORDER — WARFARIN SODIUM 6 MG PO TABS: ORAL_TABLET | ORAL | 1 refills | Status: DC

## 2019-07-12 MED ORDER — NORTRIPTYLINE HCL 10 MG PO CAPS: 10.0000 mg | ORAL_CAPSULE | Freq: Every evening | ORAL | 3 refills | Status: DC

## 2019-07-12 MED ORDER — FUROSEMIDE 20 MG PO TABS: 20.0000 mg | ORAL_TABLET | Freq: Every day | ORAL | 3 refills | Status: DC

## 2019-07-13 ENCOUNTER — Other Ambulatory Visit: Payer: Self-pay | Admitting: Internal Medicine

## 2019-07-13 DIAGNOSIS — G4489 Other headache syndrome: Secondary | ICD-10-CM

## 2019-07-14 ENCOUNTER — Other Ambulatory Visit: Payer: Self-pay | Admitting: Cardiovascular Disease

## 2019-07-14 NOTE — Telephone Encounter (Signed)
This is Dr. Christopher's pt 

## 2019-07-14 NOTE — Telephone Encounter (Signed)
New Message   *STAT* If patient is at the pharmacy, call can be transferred to refill team.   1. Which medications need to be refilled? (please list name of each medication and dose if known) losartan (COZAAR) 50 MG tablet  2. Which pharmacy/location (including street and city if local pharmacy) is medication to be sent to? [id number] - [place], [place] - [address].  3. Do they need a 30 day or 90 day supply? Patient needs short supply (maybe 15 pills) due to medication not being sent until Monday (07/18/19) to cover him. Patient is almost out of medication.

## 2019-07-15 MED ORDER — LOSARTAN POTASSIUM 50 MG PO TABS: 50.0000 mg | ORAL_TABLET | Freq: Every day | ORAL | 0 refills | Status: DC

## 2019-07-15 NOTE — Telephone Encounter (Signed)
Rx request sent to pharmacy.  

## 2019-07-19 ENCOUNTER — Ambulatory Visit (INDEPENDENT_AMBULATORY_CARE_PROVIDER_SITE_OTHER): Payer: MEDICARE | Admitting: General Practice

## 2019-07-19 ENCOUNTER — Other Ambulatory Visit: Payer: Self-pay

## 2019-07-19 DIAGNOSIS — Z7901 Long term (current) use of anticoagulants: Secondary | ICD-10-CM

## 2019-07-19 DIAGNOSIS — Z86711 Personal history of pulmonary embolism: Secondary | ICD-10-CM

## 2019-07-19 LAB — POCT INR: INR: 2.6 (ref 2.0–3.0)

## 2019-07-19 NOTE — Patient Instructions (Addendum)
Pre visit review using our clinic review tool, if applicable. No additional management support is needed unless otherwise documented below in the visit note.  Continue to take 6 mg all days except 3 mg on Mon Wed and Fridays.  Re-check in 6 weeks.  

## 2019-07-20 ENCOUNTER — Encounter: Payer: Self-pay | Admitting: Neurology

## 2019-08-28 NOTE — Progress Notes (Signed)
NEUROLOGY CONSULTATION NOTE  VERON POSSO MRN: [id number] DOB: [birth year]  Referring provider: Lew Dawes, MD Primary care provider: Lew Dawes, MD  Reason for consult:  Trigeminal neuralgia  HISTORY OF PRESENT ILLNESS: Robert [name] is a 69 year old male who presents for possible right-sided trigeminal neuralgia.  History supplemented by referring provider note.    Since 2014, he has had persistent dull non-throbbing headache across his forehead and aching right retro-orbital pain.  He also reports a pressure in his right ear as well.  He also reports a sharp and burning pain from his jaw anterior to his right ear that radiates to the right cheek and down the right side of his jaw.  Cold wind aggravates it.  Pressing behind his right ear helps relieve the pain.  It is fairly persistent, occurring off and on but worse over the past few months.  Since 2014, he also reports right perioral numbness.  He was treated with prednisone and Valtrex late last year without improvement.  CT head without contrast from 03/30/2019 was personally reviewed and was unremarkable.  He went to the dentist where full upper and lower X-rays and exam were unremarkable.    He has a nephew with trigeminal neuralgia.  He also has four sisters with history of Bell's palsy.  Current NSAIDs:  None Current analgesics:  none Current antihypertensive:  Lasix 20mg ; losartan Current antidepressant:  Nortriptyline 10mg  Current antihistamine/decongestant:  Flonase Other medication:  Warfarin  PAST MEDICAL HISTORY: Past Medical History:  Diagnosis Date  . Anxiety   . Asthma    "no attack since acupuncture in 1990's"  . Complication of anesthesia    SLOW TO WAKE UP / DIFFICULTY RESPONDING 2003, 3 days before could use left arm, "wild/crazy sometimes"  . Cyst of left kidney    "benign"  . DVT (deep venous thrombosis) ([place]) 2003   right femoral artery "from groin to knee"  . Edema    Right Leg  w/ h/o DVT postphlebitic  . H/O blood transfusion reaction 2004   FFP, extreme swelling, could not breath  . Headache(784.0)    MIGRAINES-not for a long time  . History of pulmonary embolism 2003   both lungs  . Hyperlipidemia   . Knee pain    left  . LBP (low back pain)   . 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 Obesity   . Osteoarthritis   . Peripheral vascular disease ([place])    "poor circulation in  RT leg"  . PONV (postoperative nausea and vomiting)    during surgery in 1990's  . Spondylolisthesis   . Warfarin anticoagulation   . Wheezing    when laying on left side    PAST SURGICAL HISTORY: Past Surgical History:  Procedure Laterality Date  . APPENDECTOMY  1993   Dr Margot Chimes  . CHOLECYSTECTOMY  1993  . FINGER SURGERY  2012   RT THUMB RECONSTRUCTION  . HAND SURGERY Right    abcess  . HEMORROIDECTOMY  1981  . I & D KNEE WITH POLY EXCHANGE Left 04/04/2013   Procedure: IRRIGATION AND DEBRIDEMENT KNEE WITH POLY EXCHANGE AND INSERTION OF CEMENT BEADS;  Surgeon: Gearlean Alf, MD;  Location: [facility];  Service: Orthopedics;  Laterality: Left;  . KNEE ARTHROSCOPY  1990   RT KNEE  . KNEE ARTHROSCOPY Right 2003  . KNEE ARTHROSCOPY  1988   L KNEE  . KNEE ARTHROSCOPY WITH MENISCAL REPAIR Left 10/22/2012  Procedure: LEFT KNEE ARTHROSCOPY PARTIAL MEDIAL AND LATERAL MENISCECTOMY AND DEBRIDEMENT, CHONDROPLASTY ;  Surgeon: Johnn Hai, MD;  Location: [facility];  Service: Orthopedics;  Laterality: Left;  . LUMBAR DISC SURGERY    . LUMBAR FUSION  2003   Dr Maxie Better  . ORCHIECTOMY  1994   R post injury  . TONSILLECTOMY  1967  . TOTAL KNEE ARTHROPLASTY Left 03/21/2013   Procedure: LEFT TOTAL KNEE ARTHROPLASTY;  Surgeon: Gearlean Alf, MD;  Location: [facility];  Service: Orthopedics;  Laterality: Left;    MEDICATIONS: Current Outpatient Medications on File Prior to Visit  Medication Sig Dispense Refill  . albuterol (PROVENTIL) (2.5 MG/3ML) 0.083% nebulizer solution  Take 3 mLs (2.5 mg total) by nebulization every 6 (six) hours as needed for wheezing or shortness of breath. Dx  J45.909 150 mL 3  . Cyanocobalamin (VITAMIN B-12) 5000 MCG SUBL Place 5,000 tablets under the tongue every morning.    . fish oil-omega-3 fatty acids 1000 MG capsule Take 2 g by mouth every morning.    . fluticasone (FLONASE) 50 MCG/ACT nasal spray Place 2 sprays into both nostrils daily. 16 g 6  . furosemide (LASIX) 20 MG tablet Take 1 tablet (20 mg total) by mouth daily. 90 tablet 3  . glucosamine-chondroitin 500-400 MG tablet Take 1 tablet by mouth every morning.    [name] levofloxacin (LEVAQUIN) 500 MG tablet Take 1 tablet (500 mg total) by mouth daily. 10 tablet 0  . losartan (COZAAR) 50 MG tablet Take 1 tablet (50 mg total) by mouth daily. 30 tablet 0  . nortriptyline (PAMELOR) 10 MG capsule Take 1 capsule (10 mg total) by mouth every evening. 90 capsule 3  . predniSONE (DELTASONE) 5 MG tablet TAKE 1 TABLET BY MOUTH EVERY DAY WITH BREAKFAST 90 tablet 1  . warfarin (COUMADIN) 3 MG tablet Take 1 tablet on Mon Wed and Fri or as directed by anticoagulation clinic 60 tablet 1  . warfarin (COUMADIN) 6 MG tablet Take 1 tablet on Sun, Tues, Thurs and Sat or as directed by anticoagulation clinic. 60 tablet 1   No current facility-administered medications on file prior to visit.    ALLERGIES: Allergies  Allergen Reactions  . Cat Hair Extract Itching  . Diltiazem Anaphylaxis  . Sulfa Antibiotics Hives and Swelling  . Anoro Ellipta [Umeclidinium-Vilanterol]     Too much mucus  . Breo Ellipta [Fluticasone Furoate-Vilanterol]     Too much mucus  . Exalgo [Hydromorphone Hcl] Nausea Only    Sick and nauseated  . Fentanyl Other (See Comments)    REACTION: too sleepy  . Gabapentin   . Hydrocodone-Acetaminophen Nausea Only    REACTION: nausea  . Lopressor [Metoprolol Tartrate]     Asthmatic issues   . Penicillins Hives  . Sulfonamide Derivatives Hives and Swelling  . Tylenol  [Acetaminophen]     Liver issues    FAMILY HISTORY: Family History  Problem Relation Age of Onset  . Heart disease Mother        CABG at age 67  . Hyperlipidemia Other   . Hypertension Other   . Parkinsonism Other   . Heart disease Sister   .  SOCIAL HISTORY: Social History   Socioeconomic History  . Marital status: Married    Spouse name: Not on file  . Number of children: 3  . Years of education: Not on file  . Highest education level: Not on file  Occupational History  . Occupation: Disabled    Fish farm manager: DISABLED  Tobacco Use  . Smoking status: Never Smoker  . Smokeless tobacco: Never Used  Substance and Sexual Activity  . Alcohol use: No  . Drug use: No  . Sexual activity: Yes  Other Topics Concern  . Not on file  Social History Narrative  . Not on file   Social Determinants of Health   Financial Resource Strain:   . Difficulty of Paying Living Expenses: Not on file  Food Insecurity:   . Worried About Charity fundraiser in the Last Year: Not on file  . Ran Out of Food in the Last Year: Not on file  Transportation Needs:   . Lack of Transportation (Medical): Not on file  . Lack of Transportation (Non-Medical): Not on file  Physical Activity:   . Days of Exercise per Week: Not on file  . Minutes of Exercise per Session: Not on file  Stress:   . Feeling of Stress : Not on file  Social Connections:   . Frequency of Communication with Friends and Family: Not on file  . Frequency of Social Gatherings with Friends and Family: Not on file  . Attends Religious Services: Not on file  . Active Member of Clubs or Organizations: Not on file  . Attends Archivist Meetings: Not on file  . Marital Status: Not on file  Intimate Partner Violence:   . Fear of Current or Ex-Partner: Not on file  . Emotionally Abused: Not on file  . Physically Abused: Not on file  . Sexually Abused: Not on file    REVIEW OF SYSTEMS: Constitutional: No fevers, chills, or  sweats, no generalized fatigue, change in appetite Eyes: No visual changes, double vision, eye pain Ear, nose and throat: No hearing loss, ear pain, nasal congestion, sore throat Cardiovascular: No chest pain, palpitations Respiratory:  No shortness of breath at rest or with exertion, wheezes GastrointestinaI: No nausea, vomiting, diarrhea, abdominal pain, fecal incontinence Genitourinary:  No dysuria, urinary retention or frequency Musculoskeletal:  No neck pain, back pain Integumentary: No rash, pruritus, skin lesions Neurological: as above Psychiatric: No depression, insomnia, anxiety Endocrine: No palpitations, fatigue, diaphoresis, mood swings, change in appetite, change in weight, increased thirst Hematologic/Lymphatic:  No purpura, petechiae. Allergic/Immunologic: no itchy/runny eyes, nasal congestion, recent allergic reactions, rashes  PHYSICAL EXAM: Blood pressure (!) 160/100, pulse 86, resp. rate 18, height 5\' 11"  (1.803 m), weight (!) 322 lb (146.1 kg), SpO2 97 %. General: No acute distress.  Patient appears well-groomed.  Head:  Normocephalic/atraumatic Eyes:  fundi examined but not visualized Neck: supple, no paraspinal tenderness, full range of motion Back: No paraspinal tenderness Heart: regular rate and rhythm Lungs: Clear to auscultation bilaterally. Vascular: No carotid bruits. Neurological Exam: Mental status: alert and oriented to person, place, and time, recent and remote memory intact, fund of knowledge intact, attention and concentration intact, speech fluent and not dysarthric, language intact. Cranial nerves: CN I: not tested CN II: pupils equal, round and reactive to light, visual fields intact CN III, IV, VI:  full range of motion, no nystagmus, no ptosis CN V: facial sensation intact CN VII: upper and lower face symmetric CN VIII: hearing intact CN IX, X: gag intact, uvula midline CN XI: sternocleidomastoid and trapezius muscles intact CN XII: tongue  midline Bulk & Tone: normal, no fasciculations. Motor:  5/5 throughout  Sensation:  Pinprick and vibration sensation reduced in feet up to below knees. Deep Tendon Reflexes:  2+ throughout, toes downgoing.  Finger to nose testing:  Without dysmetria.  Heel to shin:  Without dysmetria.  Gait:  Normal station and stride.  Able to turn.  Romberg with sway.  IMPRESSION: Right-sided trigeminal neuralgia Hypertension  PLAN: 1.  MRI of brain with and without contrast 2.  Start oxcarbazepine 150mg  twice daily for one week, then 300mg  twice daily.  Check BMP. 3.  Follow up with PCP regarding blood pressure.  Thank you for allowing me to take part in the care of this patient.  Metta Clines, DO  CC: Lew Dawes, MD

## 2019-08-29 ENCOUNTER — Ambulatory Visit (INDEPENDENT_AMBULATORY_CARE_PROVIDER_SITE_OTHER): Payer: MEDICARE | Admitting: Neurology

## 2019-08-29 ENCOUNTER — Encounter: Payer: Self-pay | Admitting: Neurology

## 2019-08-29 ENCOUNTER — Other Ambulatory Visit: Payer: Self-pay

## 2019-08-29 VITALS — BP 160/100 | HR 86 | Resp 18 | Ht 71.0 in | Wt 322.0 lb

## 2019-08-29 DIAGNOSIS — R200 Anesthesia of skin: Secondary | ICD-10-CM

## 2019-08-29 DIAGNOSIS — I10 Essential (primary) hypertension: Secondary | ICD-10-CM | POA: Diagnosis not present

## 2019-08-29 DIAGNOSIS — G500 Trigeminal neuralgia: Secondary | ICD-10-CM

## 2019-08-29 MED ORDER — OXCARBAZEPINE 300 MG PO TABS: ORAL_TABLET | ORAL | 0 refills | Status: DC

## 2019-08-29 NOTE — Patient Instructions (Addendum)
1. Start oxcarbazepine 300mg  tablet:  Take 1/2 tablet twice daily for one week  Then 1 tablet twice daily  Contact me for refills. 2.  Check MRI of brain with and without contrast 3.  Check BMP 4.  Follow up in 3 to 4 months  Your provider has requested that you have labwork completed today. Please go to [facility] ([id number]) on the second floor of this building before leaving the office today. You do not need to check in. If you are not called within 15 minutes please check with the front desk.  We have sent a referral to [facility] for your MRI and they will call you directly to schedule your appointment. They are located at [address]. If you need to contact them directly please call [phone].

## 2019-08-30 ENCOUNTER — Ambulatory Visit: Payer: MEDICARE

## 2019-09-13 ENCOUNTER — Other Ambulatory Visit: Payer: Self-pay

## 2019-09-13 ENCOUNTER — Ambulatory Visit (INDEPENDENT_AMBULATORY_CARE_PROVIDER_SITE_OTHER): Payer: MEDICARE | Admitting: General Practice

## 2019-09-13 DIAGNOSIS — Z7901 Long term (current) use of anticoagulants: Secondary | ICD-10-CM

## 2019-09-13 DIAGNOSIS — Z86711 Personal history of pulmonary embolism: Secondary | ICD-10-CM | POA: Diagnosis not present

## 2019-09-13 LAB — POCT INR: INR: 3 (ref 2.0–3.0)

## 2019-09-13 NOTE — Patient Instructions (Signed)
Pre visit review using our clinic review tool, if applicable. No additional management support is needed unless otherwise documented below in the visit note.  Take 3 mg today and then continue to take 6 mg all days except 3 mg on Mon Wed and Fridays.  Re-check in 6 weeks.

## 2019-09-17 ENCOUNTER — Other Ambulatory Visit: Payer: Self-pay | Admitting: Neurology

## 2019-09-21 ENCOUNTER — Other Ambulatory Visit: Payer: Self-pay | Admitting: Internal Medicine

## 2019-09-21 DIAGNOSIS — Z7901 Long term (current) use of anticoagulants: Secondary | ICD-10-CM

## 2019-10-04 ENCOUNTER — Other Ambulatory Visit: Payer: Self-pay | Admitting: Internal Medicine

## 2019-10-04 MED ORDER — ALBUTEROL SULFATE (2.5 MG/3ML) 0.083% IN NEBU: 2.5000 mg | INHALATION_SOLUTION | RESPIRATORY_TRACT | 11 refills | Status: DC | PRN

## 2019-10-07 ENCOUNTER — Other Ambulatory Visit: Payer: Self-pay

## 2019-10-07 MED ORDER — ALBUTEROL SULFATE (2.5 MG/3ML) 0.083% IN NEBU: 2.5000 mg | INHALATION_SOLUTION | RESPIRATORY_TRACT | 3 refills | Status: DC | PRN

## 2019-10-07 MED ORDER — PORTABLE COMPRESSOR NEBULIZER MISC: 3.0000 mL | 0 refills | Status: DC

## 2019-10-20 ENCOUNTER — Telehealth: Payer: Self-pay | Admitting: Internal Medicine

## 2019-10-20 NOTE — Telephone Encounter (Signed)
New Message:    Pt is returning a call. Please advise.

## 2019-10-20 NOTE — Telephone Encounter (Signed)
Pt notified that we did not call and he stated it was a reminder for tuesdays appointment

## 2019-10-25 ENCOUNTER — Other Ambulatory Visit: Payer: Self-pay

## 2019-10-25 ENCOUNTER — Ambulatory Visit (INDEPENDENT_AMBULATORY_CARE_PROVIDER_SITE_OTHER): Payer: MEDICARE | Admitting: General Practice

## 2019-10-25 DIAGNOSIS — Z7901 Long term (current) use of anticoagulants: Secondary | ICD-10-CM | POA: Diagnosis not present

## 2019-10-25 DIAGNOSIS — Z86711 Personal history of pulmonary embolism: Secondary | ICD-10-CM | POA: Diagnosis not present

## 2019-10-25 LAB — POCT INR: INR: 2.6 (ref 2.0–3.0)

## 2019-10-25 NOTE — Patient Instructions (Addendum)
Pre visit review using our clinic review tool, if applicable. No additional management support is needed unless otherwise documented below in the visit note.  Continue to take 6 mg all days except 3 mg on Mon Wed and Fridays.  Re-check in 6 weeks.  

## 2019-11-28 DIAGNOSIS — M545 Low back pain: Secondary | ICD-10-CM | POA: Diagnosis not present

## 2019-11-28 DIAGNOSIS — M5417 Radiculopathy, lumbosacral region: Secondary | ICD-10-CM | POA: Diagnosis not present

## 2019-12-06 ENCOUNTER — Ambulatory Visit (INDEPENDENT_AMBULATORY_CARE_PROVIDER_SITE_OTHER): Payer: MEDICARE | Admitting: General Practice

## 2019-12-06 ENCOUNTER — Other Ambulatory Visit: Payer: Self-pay

## 2019-12-06 DIAGNOSIS — Z7901 Long term (current) use of anticoagulants: Secondary | ICD-10-CM

## 2019-12-06 DIAGNOSIS — Z86711 Personal history of pulmonary embolism: Secondary | ICD-10-CM

## 2019-12-06 LAB — POCT INR: INR: 3.1 — AB (ref 2.0–3.0)

## 2019-12-06 NOTE — Patient Instructions (Addendum)
Pre visit review using our clinic review tool, if applicable. No additional management support is needed unless otherwise documented below in the visit note.  Take 3 mg today and then continue to take 6 mg all days except 3 mg on Mon Wed and Fridays.  Re-check in 4 to 6 weeks.

## 2019-12-15 DIAGNOSIS — M48061 Spinal stenosis, lumbar region without neurogenic claudication: Secondary | ICD-10-CM | POA: Insufficient documentation

## 2019-12-19 ENCOUNTER — Ambulatory Visit: Payer: MEDICARE | Admitting: Internal Medicine

## 2019-12-21 ENCOUNTER — Encounter: Payer: Self-pay | Admitting: Internal Medicine

## 2019-12-21 ENCOUNTER — Ambulatory Visit (INDEPENDENT_AMBULATORY_CARE_PROVIDER_SITE_OTHER): Payer: MEDICARE | Admitting: Internal Medicine

## 2019-12-21 ENCOUNTER — Other Ambulatory Visit: Payer: Self-pay

## 2019-12-21 VITALS — BP 148/98 | HR 87 | Temp 98.4°F | Ht 71.0 in | Wt 322.0 lb

## 2019-12-21 DIAGNOSIS — R739 Hyperglycemia, unspecified: Secondary | ICD-10-CM | POA: Insufficient documentation

## 2019-12-21 DIAGNOSIS — Z6841 Body Mass Index (BMI) 40.0 and over, adult: Secondary | ICD-10-CM

## 2019-12-21 DIAGNOSIS — M545 Low back pain, unspecified: Secondary | ICD-10-CM

## 2019-12-21 DIAGNOSIS — I825Y3 Chronic embolism and thrombosis of unspecified deep veins of proximal lower extremity, bilateral: Secondary | ICD-10-CM

## 2019-12-21 DIAGNOSIS — G8929 Other chronic pain: Secondary | ICD-10-CM

## 2019-12-21 DIAGNOSIS — I10 Essential (primary) hypertension: Secondary | ICD-10-CM

## 2019-12-21 DIAGNOSIS — E559 Vitamin D deficiency, unspecified: Secondary | ICD-10-CM

## 2019-12-21 DIAGNOSIS — E538 Deficiency of other specified B group vitamins: Secondary | ICD-10-CM

## 2019-12-21 LAB — HEPATIC FUNCTION PANEL
ALT: 21 U/L (ref 0–53)
AST: 21 U/L (ref 0–37)
Albumin: 4.1 g/dL (ref 3.5–5.2)
Alkaline Phosphatase: 58 U/L (ref 39–117)
Bilirubin, Direct: 0.1 mg/dL (ref 0.0–0.3)
Total Bilirubin: 0.6 mg/dL (ref 0.2–1.2)
Total Protein: 7.4 g/dL (ref 6.0–8.3)

## 2019-12-21 LAB — BASIC METABOLIC PANEL
BUN: 18 mg/dL (ref 6–23)
CO2: 30 mEq/L (ref 19–32)
Calcium: 9.6 mg/dL (ref 8.4–10.5)
Chloride: 100 mEq/L (ref 96–112)
Creatinine, Ser: 1.16 mg/dL (ref 0.40–1.50)
GFR: 62.57 mL/min (ref >60.00)
Glucose, Bld: 136 mg/dL — ABNORMAL HIGH (ref 70–99)
Potassium: 4.3 mEq/L (ref 3.5–5.1)
Sodium: 137 mEq/L (ref 135–145)

## 2019-12-21 LAB — VITAMIN D 25 HYDROXY (VIT D DEFICIENCY, FRACTURES): VITD: 36.42 ng/mL (ref 30.00–100.00)

## 2019-12-21 LAB — HEMOGLOBIN A1C: Hgb A1c MFr Bld: 6 % (ref 4.6–6.5)

## 2019-12-21 LAB — VITAMIN B12: Vitamin B-12: >1526 pg/mL — ABNORMAL HIGH (ref 211–911)

## 2019-12-21 NOTE — Assessment & Plan Note (Signed)
BP Readings from Last 3 Encounters:  12/21/19 (!) 148/98  08/29/19 (!) 160/100  06/23/19 (!) 148/84

## 2019-12-21 NOTE — Progress Notes (Signed)
Subjective:  Patient ID: Robert [name], male    DOB: [birth year]  Age: 69 y.o. MRN: [id number]  CC: No chief complaint on file.   HPI OLIVERIO CHO presents for severe LBP and leg weakness s/p fall in early June 2021 (He sat on his butt after missing a swing). Seeing Dr Maxie Better; had a dose-pack; in PT now Lyrica was stopped - side effects C/o pain 10/10 MRI report reviewed  Outpatient Medications Prior to Visit  Medication Sig Dispense Refill  . Cyanocobalamin (VITAMIN B-12) 5000 MCG SUBL Place 5,000 tablets under the tongue every morning.    . fish oil-omega-3 fatty acids 1000 MG capsule Take 2 g by mouth every morning.    . fluticasone (FLONASE) 50 MCG/ACT nasal spray Place 2 sprays into both nostrils daily. 16 g 6  . furosemide (LASIX) 20 MG tablet Take 1 tablet (20 mg total) by mouth daily. 90 tablet 3  . glucosamine-chondroitin 500-400 MG tablet Take 1 tablet by mouth every morning.    [name] levofloxacin (LEVAQUIN) 500 MG tablet Take 1 tablet (500 mg total) by mouth daily. 10 tablet 0  . losartan (COZAAR) 50 MG tablet Take 1 tablet (50 mg total) by mouth daily. 30 tablet 0  . Nebulizers (PORTABLE COMPRESSOR NEBULIZER) MISC 3 mLs by Does not apply route every 4 (four) hours. 1 each 0  . nortriptyline (PAMELOR) 10 MG capsule Take 1 capsule (10 mg total) by mouth every evening. 90 capsule 3  . predniSONE (DELTASONE) 5 MG tablet TAKE 1 TABLET BY MOUTH EVERY DAY WITH BREAKFAST 90 tablet 1  . warfarin (COUMADIN) 3 MG tablet Take 1 tablet on Mon Wed and Fri or as directed by anticoagulation clinic 60 tablet 1  . warfarin (COUMADIN) 6 MG tablet TAKE 1 TABLET ON SUN, TUES, THURS AND SAT OR AS DIRECTED BY ANTICOAGULATION CLINIC. 50 tablet 3  . albuterol (PROVENTIL) (2.5 MG/3ML) 0.083% nebulizer solution Take 3 mLs (2.5 mg total) by nebulization every 4 (four) hours as needed for wheezing or shortness of breath. Dx  J45.909 (Patient not taking: Reported on 12/21/2019) 150 mL 3  . losartan  (COZAAR) 25 MG tablet TAKE 2 TABLETS (50MG  TOTAL) BY MOUTH EVERY DAY (50MG  ON BACK ORDER)    . Oxcarbazepine (TRILEPTAL) 300 MG tablet 1 tablet twice daily 180 tablet 1   No facility-administered medications prior to visit.    ROS: Review of Systems  Constitutional: Negative for appetite change, fatigue and unexpected weight change.  HENT: Negative for congestion, nosebleeds, sneezing, sore throat and trouble swallowing.   Eyes: Negative for itching and visual disturbance.  Respiratory: Negative for cough.   Cardiovascular: Negative for chest pain, palpitations and leg swelling.  Gastrointestinal: Negative for abdominal distention, blood in stool, diarrhea and nausea.  Genitourinary: Negative for frequency and hematuria.  Musculoskeletal: Positive for back pain and gait problem. Negative for joint swelling and neck pain.  Skin: Negative for rash.  Neurological: Negative for dizziness, tremors, speech difficulty and weakness.  Psychiatric/Behavioral: Negative for agitation, dysphoric mood and sleep disturbance. The patient is not nervous/anxious.     Objective:  BP (!) 148/98 (BP Location: Left Arm, Patient Position: Sitting, Cuff Size: Large)   Pulse 87   Temp 98.4 F (36.9 C) (Oral)   Ht 5\' 11"  (1.803 m)   Wt (!) 322 lb (146.1 kg)   SpO2 95%   BMI 44.91 kg/m   BP Readings from Last 3 Encounters:  12/21/19 (!) 148/98  08/29/19 (!) 160/100  06/23/19 (!) 148/84    Wt Readings from Last 3 Encounters:  12/21/19 (!) 322 lb (146.1 kg)  08/29/19 (!) 322 lb (146.1 kg)  06/23/19 (!) 330 lb 6.4 oz (149.9 kg)    Physical Exam Constitutional:      General: He is not in acute distress.    Appearance: He is well-developed.     Comments: NAD  Eyes:     Conjunctiva/sclera: Conjunctivae normal.     Pupils: Pupils are equal, round, and reactive to light.  Neck:     Thyroid: No thyromegaly.     Vascular: No JVD.  Cardiovascular:     Rate and Rhythm: Normal rate and regular  rhythm.     Heart sounds: Normal heart sounds. No murmur heard.  No friction rub. No gallop.   Pulmonary:     Effort: Pulmonary effort is normal. No respiratory distress.     Breath sounds: Normal breath sounds. No wheezing or rales.  Chest:     Chest wall: No tenderness.  Abdominal:     General: Bowel sounds are normal. There is no distension.     Palpations: Abdomen is soft. There is no mass.     Tenderness: There is no abdominal tenderness. There is no guarding or rebound.  Musculoskeletal:        General: Tenderness present. Normal range of motion.     Cervical back: Normal range of motion.     Right lower leg: Edema present.     Left lower leg: Edema present.  Lymphadenopathy:     Cervical: No cervical adenopathy.  Skin:    General: Skin is warm and dry.     Findings: No rash.  Neurological:     Mental Status: He is alert and oriented to person, place, and time.     Cranial Nerves: No cranial nerve deficit.     Motor: No abnormal muscle tone.     Coordination: Coordination abnormal.     Gait: Gait normal.     Deep Tendon Reflexes: Reflexes are normal and symmetric.  Psychiatric:        Behavior: Behavior normal.        Thought Content: Thought content normal.        Judgment: Judgment normal.   LS w/pain Cane   Lab Results  Component Value Date   WBC 8.0 03/30/2019   HGB 15.4 03/30/2019   HCT 44.8 03/30/2019   PLT 273.0 03/30/2019   GLUCOSE 104 (H) 03/30/2019   CHOL 208 (H) 03/30/2019   TRIG 165.0 (H) 03/30/2019   HDL 34.40 (L) 03/30/2019   LDLDIRECT 137.0 03/10/2017   LDLCALC 141 (H) 03/30/2019   ALT 20 03/30/2019   AST 20 03/30/2019   NA 140 03/30/2019   K 5.0 03/30/2019   CL 102 03/30/2019   CREATININE 1.05 03/30/2019   BUN 16 03/30/2019   CO2 30 03/30/2019   TSH 3.98 03/30/2019   PSA 0.09 (L) 03/30/2019   INR 3.1 (A) 12/06/2019   HGBA1C 5.3 04/19/2007    CT Head Wo Contrast  Result Date: 03/30/2019 CLINICAL DATA:  Right-sided headache for 1  month EXAM: CT HEAD WITHOUT CONTRAST TECHNIQUE: Contiguous axial images were obtained from the base of the skull through the vertex without intravenous contrast. COMPARISON:  None. FINDINGS: Brain: No evidence of acute infarction, hemorrhage, hydrocephalus, extra-axial collection or mass lesion/mass effect. Vascular: No hyperdense vessel or unexpected calcification. Skull: Normal. Negative for fracture or focal lesion. Sinuses/Orbits: No acute finding. Other: None. IMPRESSION:  No acute intracranial pathology. No non-contrast CT findings to explain right-sided headache. Electronically Signed   By: Eddie Candle M.D.   On: 03/30/2019 14:09    Assessment & Plan:    Follow-up: No follow-ups on file.  Walker Kehr, MD

## 2019-12-21 NOTE — Assessment & Plan Note (Signed)
Try to loose wt 

## 2019-12-21 NOTE — Assessment & Plan Note (Signed)
A1c Wt loss 

## 2019-12-21 NOTE — Assessment & Plan Note (Signed)
On B12 

## 2019-12-21 NOTE — Assessment & Plan Note (Signed)
On Coumadin 

## 2019-12-21 NOTE — Addendum Note (Signed)
Addended by: Trenda Moots on: 7/90/2409 02:03 PM   Modules accepted: Orders

## 2019-12-21 NOTE — Progress Notes (Signed)
Medical screening examination/treatment/procedure(s) were performed by non-physician practitioner and as supervising physician I was immediately available for consultation/collaboration. I agree with above. Aizik Reh, MD  

## 2019-12-21 NOTE — Assessment & Plan Note (Addendum)
  Worse after a fall. Pain is 10/10- taking Tylenol Dr Maxie Better. In PT Off OxyIR now; not willing to use opioids Refused Cymbalta, Lyrica, Gabapentin DEXA BDS offered - pt declined

## 2019-12-23 DIAGNOSIS — M5417 Radiculopathy, lumbosacral region: Secondary | ICD-10-CM | POA: Diagnosis not present

## 2019-12-23 DIAGNOSIS — M545 Low back pain: Secondary | ICD-10-CM | POA: Diagnosis not present

## 2019-12-23 DIAGNOSIS — M6281 Muscle weakness (generalized): Secondary | ICD-10-CM | POA: Diagnosis not present

## 2019-12-23 DIAGNOSIS — M62830 Muscle spasm of back: Secondary | ICD-10-CM | POA: Diagnosis not present

## 2019-12-25 ENCOUNTER — Other Ambulatory Visit: Payer: Self-pay | Admitting: Internal Medicine

## 2019-12-25 MED ORDER — VITAMIN D3 125 MCG (5000 UT) PO CAPS: 1.0000 | ORAL_CAPSULE | Freq: Every day | ORAL | 3 refills | Status: DC

## 2020-01-09 DIAGNOSIS — M62830 Muscle spasm of back: Secondary | ICD-10-CM | POA: Diagnosis not present

## 2020-01-09 DIAGNOSIS — M545 Low back pain: Secondary | ICD-10-CM | POA: Diagnosis not present

## 2020-01-09 DIAGNOSIS — M5417 Radiculopathy, lumbosacral region: Secondary | ICD-10-CM | POA: Diagnosis not present

## 2020-01-09 DIAGNOSIS — M6281 Muscle weakness (generalized): Secondary | ICD-10-CM | POA: Diagnosis not present

## 2020-01-13 DIAGNOSIS — M545 Low back pain: Secondary | ICD-10-CM | POA: Diagnosis not present

## 2020-01-16 DIAGNOSIS — M6281 Muscle weakness (generalized): Secondary | ICD-10-CM | POA: Diagnosis not present

## 2020-01-16 DIAGNOSIS — M62830 Muscle spasm of back: Secondary | ICD-10-CM | POA: Diagnosis not present

## 2020-01-16 DIAGNOSIS — M5417 Radiculopathy, lumbosacral region: Secondary | ICD-10-CM | POA: Diagnosis not present

## 2020-01-16 DIAGNOSIS — M545 Low back pain: Secondary | ICD-10-CM | POA: Diagnosis not present

## 2020-01-17 ENCOUNTER — Other Ambulatory Visit: Payer: Self-pay

## 2020-01-17 ENCOUNTER — Ambulatory Visit (INDEPENDENT_AMBULATORY_CARE_PROVIDER_SITE_OTHER): Payer: MEDICARE | Admitting: General Practice

## 2020-01-17 DIAGNOSIS — Z7901 Long term (current) use of anticoagulants: Secondary | ICD-10-CM | POA: Diagnosis not present

## 2020-01-17 DIAGNOSIS — Z86711 Personal history of pulmonary embolism: Secondary | ICD-10-CM | POA: Diagnosis not present

## 2020-01-17 LAB — POCT INR: INR: 2.5 (ref 2.0–3.0)

## 2020-01-17 NOTE — Patient Instructions (Addendum)
Pre visit review using our clinic review tool, if applicable. No additional management support is needed unless otherwise documented below in the visit note.  Continue to take 6 mg all days except 3 mg on Mon Wed and Fridays.  Re-check in 6 weeks.  

## 2020-01-19 DIAGNOSIS — M545 Low back pain: Secondary | ICD-10-CM | POA: Diagnosis not present

## 2020-01-19 DIAGNOSIS — M546 Pain in thoracic spine: Secondary | ICD-10-CM | POA: Diagnosis not present

## 2020-01-19 DIAGNOSIS — M62830 Muscle spasm of back: Secondary | ICD-10-CM | POA: Diagnosis not present

## 2020-01-19 DIAGNOSIS — M6281 Muscle weakness (generalized): Secondary | ICD-10-CM | POA: Diagnosis not present

## 2020-01-19 DIAGNOSIS — M5417 Radiculopathy, lumbosacral region: Secondary | ICD-10-CM | POA: Diagnosis not present

## 2020-01-25 DIAGNOSIS — M62830 Muscle spasm of back: Secondary | ICD-10-CM | POA: Diagnosis not present

## 2020-01-25 DIAGNOSIS — M545 Low back pain: Secondary | ICD-10-CM | POA: Diagnosis not present

## 2020-01-25 DIAGNOSIS — M6281 Muscle weakness (generalized): Secondary | ICD-10-CM | POA: Diagnosis not present

## 2020-01-25 DIAGNOSIS — M5417 Radiculopathy, lumbosacral region: Secondary | ICD-10-CM | POA: Diagnosis not present

## 2020-01-30 DIAGNOSIS — M48062 Spinal stenosis, lumbar region with neurogenic claudication: Secondary | ICD-10-CM | POA: Diagnosis not present

## 2020-01-30 DIAGNOSIS — M48061 Spinal stenosis, lumbar region without neurogenic claudication: Secondary | ICD-10-CM | POA: Diagnosis not present

## 2020-01-30 DIAGNOSIS — M545 Low back pain: Secondary | ICD-10-CM | POA: Diagnosis not present

## 2020-01-30 DIAGNOSIS — G9589 Other specified diseases of spinal cord: Secondary | ICD-10-CM | POA: Diagnosis not present

## 2020-02-01 ENCOUNTER — Encounter: Payer: Self-pay | Admitting: Internal Medicine

## 2020-02-01 ENCOUNTER — Other Ambulatory Visit: Payer: Self-pay

## 2020-02-01 ENCOUNTER — Ambulatory Visit (INDEPENDENT_AMBULATORY_CARE_PROVIDER_SITE_OTHER): Payer: MEDICARE | Admitting: Internal Medicine

## 2020-02-01 DIAGNOSIS — Z7901 Long term (current) use of anticoagulants: Secondary | ICD-10-CM

## 2020-02-01 DIAGNOSIS — E538 Deficiency of other specified B group vitamins: Secondary | ICD-10-CM | POA: Diagnosis not present

## 2020-02-01 DIAGNOSIS — I825Y3 Chronic embolism and thrombosis of unspecified deep veins of proximal lower extremity, bilateral: Secondary | ICD-10-CM | POA: Diagnosis not present

## 2020-02-01 DIAGNOSIS — M6281 Muscle weakness (generalized): Secondary | ICD-10-CM | POA: Diagnosis not present

## 2020-02-01 DIAGNOSIS — R600 Localized edema: Secondary | ICD-10-CM | POA: Diagnosis not present

## 2020-02-01 DIAGNOSIS — M62830 Muscle spasm of back: Secondary | ICD-10-CM | POA: Diagnosis not present

## 2020-02-01 DIAGNOSIS — M545 Low back pain: Secondary | ICD-10-CM | POA: Diagnosis not present

## 2020-02-01 DIAGNOSIS — M5417 Radiculopathy, lumbosacral region: Secondary | ICD-10-CM | POA: Diagnosis not present

## 2020-02-01 DIAGNOSIS — I10 Essential (primary) hypertension: Secondary | ICD-10-CM | POA: Diagnosis not present

## 2020-02-01 MED ORDER — WARFARIN SODIUM 6 MG PO TABS: ORAL_TABLET | ORAL | 3 refills | Status: DC

## 2020-02-01 MED ORDER — WARFARIN SODIUM 3 MG PO TABS: ORAL_TABLET | ORAL | 3 refills | Status: DC

## 2020-02-01 NOTE — Assessment & Plan Note (Signed)
Better Losartan

## 2020-02-01 NOTE — Assessment & Plan Note (Signed)
On B12 

## 2020-02-01 NOTE — Assessment & Plan Note (Signed)
R>L Compr sock R

## 2020-02-01 NOTE — Assessment & Plan Note (Signed)
Coumadin Compr sock R

## 2020-02-01 NOTE — Progress Notes (Signed)
Subjective:  Patient ID: Robert [name], male    DOB: 03-13-1951  Age: 69 y.o. MRN: [id number]  CC: No chief complaint on file.   HPI Robert [name] presents for severe LBP and leg weakness s/p fall in early June 2021 (He sat on his butt after missing a swing). Seeing Dr Maxie Better; had a dose-pack; in water PT now. Much better F/u HTN, DVT  Outpatient Medications Prior to Visit  Medication Sig Dispense Refill  . albuterol (PROVENTIL) (2.5 MG/3ML) 0.083% nebulizer solution Take 3 mLs (2.5 mg total) by nebulization every 4 (four) hours as needed for wheezing or shortness of breath. Dx  J45.909 150 mL 3  . Cyanocobalamin (VITAMIN B-12) 5000 MCG SUBL Place 5,000 tablets under the tongue every morning.    . fish oil-omega-3 fatty acids 1000 MG capsule Take 2 g by mouth every morning.    . fluticasone (FLONASE) 50 MCG/ACT nasal spray Place 2 sprays into both nostrils daily. 16 g 6  . furosemide (LASIX) 20 MG tablet Take 1 tablet (20 mg total) by mouth daily. 90 tablet 3  . glucosamine-chondroitin 500-400 MG tablet Take 1 tablet by mouth every morning.    [name] losartan (COZAAR) 50 MG tablet Take 1 tablet (50 mg total) by mouth daily. 30 tablet 0  . Nebulizers (PORTABLE COMPRESSOR NEBULIZER) MISC 3 mLs by Does not apply route every 4 (four) hours. 1 each 0  . nortriptyline (PAMELOR) 10 MG capsule Take 1 capsule (10 mg total) by mouth every evening. 90 capsule 3  . predniSONE (DELTASONE) 5 MG tablet TAKE 1 TABLET BY MOUTH EVERY DAY WITH BREAKFAST 90 tablet 1  . warfarin (COUMADIN) 3 MG tablet Take 1 tablet on Mon Wed and Fri or as directed by anticoagulation clinic 60 tablet 1  . warfarin (COUMADIN) 6 MG tablet TAKE 1 TABLET ON SUN, TUES, THURS AND SAT OR AS DIRECTED BY ANTICOAGULATION CLINIC. 50 tablet 3  . Cholecalciferol (VITAMIN D3) 125 MCG (5000 UT) CAPS Take 1 capsule (5,000 Units total) by mouth daily. 100 capsule 3  . levofloxacin (LEVAQUIN) 500 MG tablet Take 1 tablet (500 mg total) by  mouth daily. 10 tablet 0   No facility-administered medications prior to visit.    ROS: Review of Systems  Constitutional: Negative for appetite change, fatigue and unexpected weight change.  HENT: Negative for congestion, nosebleeds, sneezing, sore throat and trouble swallowing.   Eyes: Negative for itching and visual disturbance.  Respiratory: Negative for cough.   Cardiovascular: Positive for leg swelling. Negative for chest pain and palpitations.  Gastrointestinal: Negative for abdominal distention, blood in stool, diarrhea and nausea.  Genitourinary: Negative for frequency and hematuria.  Musculoskeletal: Positive for arthralgias, back pain and gait problem. Negative for joint swelling and neck pain.  Skin: Negative for rash.  Neurological: Negative for dizziness, tremors, speech difficulty and weakness.  Psychiatric/Behavioral: Negative for agitation, dysphoric mood and sleep disturbance. The patient is not nervous/anxious.     Objective:  BP 130/68 (BP Location: Left Arm, Patient Position: Sitting, Cuff Size: Large)   Pulse 81   Temp 98.2 F (36.8 C) (Oral)   Ht 5\' 11"  (1.803 m)   Wt (!) 329 lb (149.2 kg)   SpO2 97%   BMI 45.89 kg/m   BP Readings from Last 3 Encounters:  02/01/20 130/68  12/21/19 (!) 148/98  08/29/19 (!) 160/100    Wt Readings from Last 3 Encounters:  02/01/20 (!) 329 lb (149.2 kg)  12/21/19 (!) 322 lb (146.1  kg)  08/29/19 (!) 322 lb (146.1 kg)    Physical Exam Constitutional:      General: He is not in acute distress.    Appearance: He is well-developed. He is obese.     Comments: NAD  Eyes:     Conjunctiva/sclera: Conjunctivae normal.     Pupils: Pupils are equal, round, and reactive to light.  Neck:     Thyroid: No thyromegaly.     Vascular: No JVD.  Cardiovascular:     Rate and Rhythm: Normal rate and regular rhythm.     Heart sounds: Normal heart sounds. No murmur heard.  No friction rub. No gallop.   Pulmonary:     Effort:  Pulmonary effort is normal. No respiratory distress.     Breath sounds: Normal breath sounds. No wheezing or rales.  Chest:     Chest wall: No tenderness.  Abdominal:     General: Bowel sounds are normal. There is no distension.     Palpations: Abdomen is soft. There is no mass.     Tenderness: There is no abdominal tenderness. There is no guarding or rebound.  Musculoskeletal:        General: Tenderness present. Normal range of motion.     Cervical back: Normal range of motion.     Right lower leg: Edema present.     Left lower leg: Edema present.  Lymphadenopathy:     Cervical: No cervical adenopathy.  Skin:    General: Skin is warm and dry.     Findings: No rash.  Neurological:     Mental Status: He is alert and oriented to person, place, and time.     Cranial Nerves: No cranial nerve deficit.     Motor: No abnormal muscle tone.     Coordination: Coordination normal.     Gait: Gait normal.     Deep Tendon Reflexes: Reflexes are normal and symmetric.  Psychiatric:        Behavior: Behavior normal.        Thought Content: Thought content normal.        Judgment: Judgment normal.   LS spine pain,gait - better  Lab Results  Component Value Date   WBC 8.0 03/30/2019   HGB 15.4 03/30/2019   HCT 44.8 03/30/2019   PLT 273.0 03/30/2019   GLUCOSE 136 (H) 12/21/2019   CHOL 208 (H) 03/30/2019   TRIG 165.0 (H) 03/30/2019   HDL 34.40 (L) 03/30/2019   LDLDIRECT 137.0 03/10/2017   LDLCALC 141 (H) 03/30/2019   ALT 21 12/21/2019   AST 21 12/21/2019   NA 137 12/21/2019   K 4.3 12/21/2019   CL 100 12/21/2019   CREATININE 1.16 12/21/2019   BUN 18 12/21/2019   CO2 30 12/21/2019   TSH 3.98 03/30/2019   PSA 0.09 (L) 03/30/2019   INR 2.5 01/17/2020   HGBA1C 6.0 12/21/2019    CT Head Wo Contrast  Result Date: 03/30/2019 CLINICAL DATA:  Right-sided headache for 1 month EXAM: CT HEAD WITHOUT CONTRAST TECHNIQUE: Contiguous axial images were obtained from the base of the skull  through the vertex without intravenous contrast. COMPARISON:  None. FINDINGS: Brain: No evidence of acute infarction, hemorrhage, hydrocephalus, extra-axial collection or mass lesion/mass effect. Vascular: No hyperdense vessel or unexpected calcification. Skull: Normal. Negative for fracture or focal lesion. Sinuses/Orbits: No acute finding. Other: None. IMPRESSION: No acute intracranial pathology. No non-contrast CT findings to explain right-sided headache. Electronically Signed   By: Eddie Candle M.D.   On:  03/30/2019 14:09    Assessment & Plan:    Walker Kehr, MD

## 2020-02-09 DIAGNOSIS — M62830 Muscle spasm of back: Secondary | ICD-10-CM | POA: Diagnosis not present

## 2020-02-09 DIAGNOSIS — M5417 Radiculopathy, lumbosacral region: Secondary | ICD-10-CM | POA: Diagnosis not present

## 2020-02-09 DIAGNOSIS — M545 Low back pain: Secondary | ICD-10-CM | POA: Diagnosis not present

## 2020-02-09 DIAGNOSIS — M6281 Muscle weakness (generalized): Secondary | ICD-10-CM | POA: Diagnosis not present

## 2020-02-16 ENCOUNTER — Other Ambulatory Visit: Payer: Self-pay | Admitting: Internal Medicine

## 2020-02-16 DIAGNOSIS — M62830 Muscle spasm of back: Secondary | ICD-10-CM | POA: Diagnosis not present

## 2020-02-16 DIAGNOSIS — M6281 Muscle weakness (generalized): Secondary | ICD-10-CM | POA: Diagnosis not present

## 2020-02-16 DIAGNOSIS — M545 Low back pain: Secondary | ICD-10-CM | POA: Diagnosis not present

## 2020-02-16 DIAGNOSIS — M5417 Radiculopathy, lumbosacral region: Secondary | ICD-10-CM | POA: Diagnosis not present

## 2020-02-16 MED ORDER — ALBUTEROL SULFATE (2.5 MG/3ML) 0.083% IN NEBU: 2.5000 mg | INHALATION_SOLUTION | RESPIRATORY_TRACT | 3 refills | Status: DC | PRN

## 2020-02-20 DIAGNOSIS — M6281 Muscle weakness (generalized): Secondary | ICD-10-CM | POA: Diagnosis not present

## 2020-02-20 DIAGNOSIS — M62830 Muscle spasm of back: Secondary | ICD-10-CM | POA: Diagnosis not present

## 2020-02-20 DIAGNOSIS — M5417 Radiculopathy, lumbosacral region: Secondary | ICD-10-CM | POA: Diagnosis not present

## 2020-02-20 DIAGNOSIS — M545 Low back pain: Secondary | ICD-10-CM | POA: Diagnosis not present

## 2020-02-23 ENCOUNTER — Telehealth: Payer: Self-pay

## 2020-02-23 NOTE — Telephone Encounter (Signed)
PA initiated for albuterol Key: [id number]

## 2020-02-28 ENCOUNTER — Ambulatory Visit: Payer: MEDICARE

## 2020-02-28 DIAGNOSIS — M5417 Radiculopathy, lumbosacral region: Secondary | ICD-10-CM | POA: Diagnosis not present

## 2020-02-28 DIAGNOSIS — M545 Low back pain: Secondary | ICD-10-CM | POA: Diagnosis not present

## 2020-02-28 DIAGNOSIS — M6281 Muscle weakness (generalized): Secondary | ICD-10-CM | POA: Diagnosis not present

## 2020-02-28 DIAGNOSIS — M62830 Muscle spasm of back: Secondary | ICD-10-CM | POA: Diagnosis not present

## 2020-03-01 ENCOUNTER — Other Ambulatory Visit: Payer: Self-pay

## 2020-03-01 ENCOUNTER — Ambulatory Visit (INDEPENDENT_AMBULATORY_CARE_PROVIDER_SITE_OTHER): Payer: MEDICARE | Admitting: General Practice

## 2020-03-01 DIAGNOSIS — Z86711 Personal history of pulmonary embolism: Secondary | ICD-10-CM | POA: Diagnosis not present

## 2020-03-01 DIAGNOSIS — Z7901 Long term (current) use of anticoagulants: Secondary | ICD-10-CM

## 2020-03-01 LAB — POCT INR: INR: 3.1 — AB (ref 2.0–3.0)

## 2020-03-01 NOTE — Patient Instructions (Addendum)
Pre visit review using our clinic review tool, if applicable. No additional management support is needed unless otherwise documented below in the visit note.  Continue to take 6 mg all days except 3 mg on Mon Wed and Fridays.  Re-check in 6 weeks.

## 2020-03-02 NOTE — Telephone Encounter (Signed)
PA denied.

## 2020-03-09 ENCOUNTER — Other Ambulatory Visit: Payer: Self-pay | Admitting: Internal Medicine

## 2020-03-10 DIAGNOSIS — Z20828 Contact with and (suspected) exposure to other viral communicable diseases: Secondary | ICD-10-CM | POA: Diagnosis not present

## 2020-03-17 ENCOUNTER — Encounter (HOSPITAL_COMMUNITY): Payer: Self-pay

## 2020-03-17 ENCOUNTER — Inpatient Hospital Stay (HOSPITAL_COMMUNITY)
Admission: EM | Admit: 2020-03-17 | Discharge: 2020-03-22 | DRG: 177 | Disposition: A | Discharge status: Home Health | Payer: MEDICARE | Attending: Internal Medicine | Admitting: Internal Medicine

## 2020-03-17 ENCOUNTER — Emergency Department (HOSPITAL_COMMUNITY): Payer: MEDICARE

## 2020-03-17 ENCOUNTER — Other Ambulatory Visit: Payer: Self-pay

## 2020-03-17 DIAGNOSIS — Z981 Arthrodesis status: Secondary | ICD-10-CM

## 2020-03-17 DIAGNOSIS — Z6841 Body Mass Index (BMI) 40.0 and over, adult: Secondary | ICD-10-CM

## 2020-03-17 DIAGNOSIS — Z9049 Acquired absence of other specified parts of digestive tract: Secondary | ICD-10-CM

## 2020-03-17 DIAGNOSIS — Z880 Allergy status to penicillin: Secondary | ICD-10-CM

## 2020-03-17 DIAGNOSIS — Z882 Allergy status to sulfonamides status: Secondary | ICD-10-CM

## 2020-03-17 DIAGNOSIS — G8929 Other chronic pain: Secondary | ICD-10-CM | POA: Diagnosis present

## 2020-03-17 DIAGNOSIS — R918 Other nonspecific abnormal finding of lung field: Secondary | ICD-10-CM | POA: Diagnosis not present

## 2020-03-17 DIAGNOSIS — Z820 Family history of epilepsy and other diseases of the nervous system: Secondary | ICD-10-CM

## 2020-03-17 DIAGNOSIS — R000 Tachycardia, unspecified: Secondary | ICD-10-CM | POA: Diagnosis not present

## 2020-03-17 DIAGNOSIS — G9341 Metabolic encephalopathy: Secondary | ICD-10-CM | POA: Diagnosis present

## 2020-03-17 DIAGNOSIS — J1282 Pneumonia due to coronavirus disease 2019: Secondary | ICD-10-CM | POA: Diagnosis present

## 2020-03-17 DIAGNOSIS — R197 Diarrhea, unspecified: Secondary | ICD-10-CM | POA: Diagnosis present

## 2020-03-17 DIAGNOSIS — U071 COVID-19: Principal | ICD-10-CM

## 2020-03-17 DIAGNOSIS — J9601 Acute respiratory failure with hypoxia: Secondary | ICD-10-CM | POA: Diagnosis present

## 2020-03-17 DIAGNOSIS — J90 Pleural effusion, not elsewhere classified: Secondary | ICD-10-CM | POA: Diagnosis not present

## 2020-03-17 DIAGNOSIS — I4891 Unspecified atrial fibrillation: Secondary | ICD-10-CM | POA: Diagnosis not present

## 2020-03-17 DIAGNOSIS — E871 Hypo-osmolality and hyponatremia: Secondary | ICD-10-CM | POA: Diagnosis present

## 2020-03-17 DIAGNOSIS — Z86711 Personal history of pulmonary embolism: Secondary | ICD-10-CM | POA: Diagnosis present

## 2020-03-17 DIAGNOSIS — R7401 Elevation of levels of liver transaminase levels: Secondary | ICD-10-CM

## 2020-03-17 DIAGNOSIS — R0902 Hypoxemia: Secondary | ICD-10-CM | POA: Diagnosis not present

## 2020-03-17 DIAGNOSIS — Z886 Allergy status to analgesic agent status: Secondary | ICD-10-CM

## 2020-03-17 DIAGNOSIS — Z885 Allergy status to narcotic agent status: Secondary | ICD-10-CM

## 2020-03-17 DIAGNOSIS — R791 Abnormal coagulation profile: Secondary | ICD-10-CM

## 2020-03-17 DIAGNOSIS — Z7901 Long term (current) use of anticoagulants: Secondary | ICD-10-CM

## 2020-03-17 DIAGNOSIS — J45909 Unspecified asthma, uncomplicated: Secondary | ICD-10-CM | POA: Diagnosis present

## 2020-03-17 DIAGNOSIS — I10 Essential (primary) hypertension: Secondary | ICD-10-CM | POA: Diagnosis present

## 2020-03-17 DIAGNOSIS — Z79899 Other long term (current) drug therapy: Secondary | ICD-10-CM

## 2020-03-17 DIAGNOSIS — Z7952 Long term (current) use of systemic steroids: Secondary | ICD-10-CM

## 2020-03-17 DIAGNOSIS — R531 Weakness: Secondary | ICD-10-CM | POA: Diagnosis not present

## 2020-03-17 DIAGNOSIS — J1289 Other viral pneumonia: Secondary | ICD-10-CM | POA: Diagnosis not present

## 2020-03-17 DIAGNOSIS — Z86718 Personal history of other venous thrombosis and embolism: Secondary | ICD-10-CM

## 2020-03-17 DIAGNOSIS — Z8249 Family history of ischemic heart disease and other diseases of the circulatory system: Secondary | ICD-10-CM

## 2020-03-17 DIAGNOSIS — R0602 Shortness of breath: Secondary | ICD-10-CM | POA: Diagnosis not present

## 2020-03-17 DIAGNOSIS — Z888 Allergy status to other drugs, medicaments and biological substances status: Secondary | ICD-10-CM

## 2020-03-17 DIAGNOSIS — Z283 Underimmunization status: Secondary | ICD-10-CM

## 2020-03-17 DIAGNOSIS — R410 Disorientation, unspecified: Secondary | ICD-10-CM | POA: Diagnosis not present

## 2020-03-17 DIAGNOSIS — I70201 Unspecified atherosclerosis of native arteries of extremities, right leg: Secondary | ICD-10-CM | POA: Diagnosis present

## 2020-03-17 DIAGNOSIS — E785 Hyperlipidemia, unspecified: Secondary | ICD-10-CM | POA: Diagnosis present

## 2020-03-17 DIAGNOSIS — R5381 Other malaise: Secondary | ICD-10-CM | POA: Diagnosis present

## 2020-03-17 DIAGNOSIS — R638 Other symptoms and signs concerning food and fluid intake: Secondary | ICD-10-CM

## 2020-03-17 DIAGNOSIS — J9600 Acute respiratory failure, unspecified whether with hypoxia or hypercapnia: Secondary | ICD-10-CM

## 2020-03-17 MED ORDER — LACTATED RINGERS IV BOLUS (SEPSIS)
400.0000 mL | Freq: Once | INTRAVENOUS | Status: AC
  Administered 2020-03-18: 400 mL via INTRAVENOUS

## 2020-03-17 MED ORDER — LACTATED RINGERS IV BOLUS (SEPSIS)
1000.0000 mL | Freq: Once | INTRAVENOUS | Status: AC
  Administered 2020-03-18: 1000 mL via INTRAVENOUS

## 2020-03-17 MED ORDER — LACTATED RINGERS IV SOLN: INTRAVENOUS | Status: AC

## 2020-03-17 MED ORDER — LEVOFLOXACIN IN D5W 750 MG/150ML IV SOLN
750.0000 mg | Freq: Once | INTRAVENOUS | Status: AC
  Administered 2020-03-18: 750 mg via INTRAVENOUS
  Filled 2020-03-17: qty 150

## 2020-03-17 NOTE — ED Triage Notes (Signed)
Pt BIB [facility] from home with multiple complaints.  Pt tested Positive for COVID-19 Saturday 9/18. Since then pt has been having increased SOB, Weakness and general malaise.   Family reports intermittent confusion at home but pt is currently A&Ox4, GCS 15.  Pt was hypoxic upon EMS arrival, 85% on RA  VSS with EMS  141/80 BP 18 RR 6L [place] - 96% O2 (Baseline is Room Air)

## 2020-03-17 NOTE — ED Provider Notes (Signed)
[facility] EMERGENCY DEPARTMENT Provider Note   CSN: [id number] Arrival date & time: 03/17/20  2252   History Chief Complaint  Patient presents with  . COVID-19  . Fatigue  . Shortness of Breath    Robert [name] is a 69 y.o. male.  The history is provided by the patient and the EMS personnel.  Shortness of Breath He has history of hypertension, hyperlipidemia, pulmonary embolism anticoagulated on warfarin and states that he was diagnosed with COVID-19 1 week ago.  He has had fevers as high as 100 degrees along with a cough productive of some green sputum.  There has been some nausea but no vomiting.  He has had some diarrhea.  Today, he started losing his sense of smell and sense of taste.  He came in today because of increasing weakness.  He denies dyspnea.  He had not been vaccinated against COVID-19.  Apparently, family has also noted some increased confusion.  Of note, patient states that he has a DNR paper but wishes to be full code.  Past Medical History:  Diagnosis Date  . Anxiety   . Asthma    "no attack since acupuncture in 1990's"  . Complication of anesthesia    SLOW TO WAKE UP / DIFFICULTY RESPONDING 2003, 3 days before could use left arm, "wild/crazy sometimes"  . Cyst of left kidney    "benign"  . DVT (deep venous thrombosis) ([place]) 2003   right femoral artery "from groin to knee"  . Edema    Right Leg w/ h/o DVT postphlebitic  . H/O blood transfusion reaction 2004   FFP, extreme swelling, could not breath  . Headache(784.0)    MIGRAINES-not for a long time  . History of pulmonary embolism 2003   both lungs  . Hyperlipidemia   . Knee pain    left  . LBP (low back pain)   . 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 Obesity   . Osteoarthritis   . Peripheral vascular disease ([place])    "poor circulation in  RT leg"  . PONV (postoperative nausea and vomiting)    during surgery in 1990's  . Spondylolisthesis   .  Warfarin anticoagulation   . Wheezing    when laying on left side    Patient Active Problem List   Diagnosis Date Noted  . Hyperglycemia 12/21/2019  . Essential hypertension 11/24/2018  . History of DVT of lower extremity 07/29/2018  . Cellulitis, leg 04/12/2018  . Allergic rhinitis 04/12/2018  . Long term (current) use of anticoagulants 03/20/2017  . Well adult exam 02/18/2016  . Chronic venous insufficiency 02/18/2016  . Foot pain, right 02/27/2015  . Lyme disease 12/26/2014  . SVT (supraventricular tachycardia) ([place]) 05/09/2014  . Sinusitis, chronic 02/24/2014  . Cough 02/24/2014  . Encounter for therapeutic drug monitoring 07/22/2013  . Right thigh pain 07/13/2013  . Dysuria 06/28/2013  . Anemia 06/28/2013  . Aortic stenosis 05/11/2013  . Osteoarthritis of right knee 05/11/2013  . Paroxysmal supraventricular tachycardia ([place]) 04/05/2013  . Postoperative anemia due to acute blood loss 03/22/2013  . Hyponatremia 03/22/2013  . OA (osteoarthritis) of knee 03/21/2013  . Osteoarthritis of left knee 03/14/2013  . Acute sinus infection 01/04/2013  . Dermatitis 01/04/2013  . Elevated LFTs 12/21/2012  . Abscess of finger, left 04/05/2011  . Chest pain 03/24/2011  . Hematuria, gross 02/26/2011  . Low back pain 02/25/2011  . Pulmonary nodule, right 11/26/2010  . 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 Chest pain syndrome 09/25/2010  . History of pulmonary embolism 09/04/2010  . Long term current use of anticoagulant 09/04/2010  . B12 deficiency 07/23/2010  . PARESTHESIA 06/18/2010  . DVT of leg (deep venous thrombosis) ([place]) 05/15/2010  . LYMPHADENOPATHY 05/15/2010  . Obesity 11/14/2009  . OTITIS EXTERNA 11/14/2009  . SEBORRHEIC DERMATITIS 11/14/2009  . DIARRHEA, ACUTE 10/08/2009  . Dyslipidemia 05/15/2009  . OSTEOARTHRITIS 05/15/2009  . OTITIS MEDIA 10/27/2008  . CELLULITIS, HAND, LEFT 10/27/2008  . DEHYDRATION (VOLUME DEPLETION) 04/03/2008  . Asthma 08/26/2007  .  Neoplasm of uncertain behavior of skin 04/23/2007  . Edema 04/23/2007  . PULMONARY EMBOLISM, HX OF 04/23/2007    Past Surgical History:  Procedure Laterality Date  . APPENDECTOMY  1993   Dr Margot Chimes  . CHOLECYSTECTOMY  1993  . FINGER SURGERY  2012   RT THUMB RECONSTRUCTION  . HAND SURGERY Right    abcess  . HEMORROIDECTOMY  1981  . I & D KNEE WITH POLY EXCHANGE Left 04/04/2013   Procedure: IRRIGATION AND DEBRIDEMENT KNEE WITH POLY EXCHANGE AND INSERTION OF CEMENT BEADS;  Surgeon: Gearlean Alf, MD;  Location: [facility];  Service: Orthopedics;  Laterality: Left;  . KNEE ARTHROSCOPY  1990   RT KNEE  . KNEE ARTHROSCOPY Right 2003  . KNEE ARTHROSCOPY  1988   L KNEE  . KNEE ARTHROSCOPY WITH MENISCAL REPAIR Left 10/22/2012   Procedure: LEFT KNEE ARTHROSCOPY PARTIAL MEDIAL AND LATERAL MENISCECTOMY AND DEBRIDEMENT, CHONDROPLASTY ;  Surgeon: Johnn Hai, MD;  Location: [facility];  Service: Orthopedics;  Laterality: Left;  . LUMBAR DISC SURGERY    . LUMBAR FUSION  2003   Dr Maxie Better  . ORCHIECTOMY  1994   R post injury  . TONSILLECTOMY  1967  . TOTAL KNEE ARTHROPLASTY Left 03/21/2013   Procedure: LEFT TOTAL KNEE ARTHROPLASTY;  Surgeon: Gearlean Alf, MD;  Location: [facility];  Service: Orthopedics;  Laterality: Left;       Family History  Problem Relation Age of Onset  . Heart disease Mother        CABG at age 32  . Hyperlipidemia Other   . Hypertension Other   . Parkinsonism Other   . Heart disease Sister     Social History   Tobacco Use  . Smoking status: Never Smoker  . Smokeless tobacco: Never Used  Vaping Use  . Vaping Use: Never used  Substance Use Topics  . Alcohol use: No  . Drug use: No    Home Medications Prior to Admission medications   Medication Sig Start Date End Date Taking? Authorizing Provider  albuterol (PROVENTIL) (2.5 MG/3ML) 0.083% nebulizer solution Take 3 mLs (2.5 mg total) by nebulization every 4 (four) hours as needed for wheezing or shortness of  breath. Dx  H47.654 02/16/20   [name], [name], MD  Cholecalciferol (VITAMIN D3) 125 MCG (5000 UT) CAPS Take 1 capsule (5,000 Units total) by mouth daily. 12/25/19   [name], [name], MD  Cyanocobalamin (VITAMIN B-12) 5000 MCG SUBL Place 5,000 tablets under the tongue every morning.    [provider]  fish oil-omega-3 fatty acids 1000 MG capsule Take 2 g by mouth every morning.    [provider]  fluticasone (FLONASE) 50 MCG/ACT nasal spray Place 2 sprays into both nostrils daily. 04/12/18   [name], [name], MD  furosemide (LASIX) 20 MG tablet Take 1 tablet (20 mg total) by mouth daily. 07/12/19   [name], [name], MD  glucosamine-chondroitin 500-400 MG tablet Take 1 tablet by mouth every morning.    [provider]  levofloxacin (LEVAQUIN) 500 MG tablet Take 1 tablet (500 mg total) by mouth daily. 06/23/19   [name], [name], MD  losartan (COZAAR) 50 MG tablet Take 1 tablet (50 mg total) by mouth daily. 07/15/19   Buford Dresser, MD  Nebulizers (PORTABLE COMPRESSOR NEBULIZER) MISC 3 mLs by Does not apply route every 4 (four) hours. 10/07/19   [name], [name], MD  nortriptyline (PAMELOR) 10 MG capsule Take 1 capsule (10 mg total) by mouth every evening. 07/12/19   [name], [name], MD  predniSONE (DELTASONE) 5 MG tablet TAKE 1 TABLET DAILY WITH BREAKFAST 03/12/20   [name], [name], MD  warfarin (COUMADIN) 3 MG tablet Take 1 tablet on Mon Wed and Fri or as directed by anticoagulation clinic 02/01/20   [name], [name], MD  warfarin (COUMADIN) 6 MG tablet As directed 02/01/20   [name], [name], MD    Allergies    Cat hair extract, Diltiazem, Sulfa antibiotics, Anoro ellipta [umeclidinium-vilanterol], Breo ellipta [fluticasone furoate-vilanterol], Exalgo [hydromorphone hcl], Fentanyl, Gabapentin, Hydrocodone-acetaminophen, Lopressor [metoprolol tartrate], Penicillins, Sulfonamide derivatives, and Tylenol [acetaminophen]  Review  of Systems   Review of Systems  Respiratory: Positive for shortness of breath.   All other systems reviewed and are negative.   Physical Exam Updated Vital Signs BP 130/77 (BP Location: Right Arm)   Pulse (!) 137   Temp (!) 101.6 F (38.7 C) (Oral)   Resp (!) 26   Ht 5\' 11"  (1.803 m)   Wt (!) 144.2 kg   SpO2 97%   BMI 44.35 kg/m   Physical Exam Vitals and nursing note reviewed.   Morbidly obese 69 year old male, in mild respiratory distress.  He is unable to speak more than about 3 words without taking a breath.  Vital signs are significant for elevated temperature, respiratory rate, heart rate. Oxygen saturation is 97%, which is normal. Head is normocephalic and atraumatic. PERRLA, EOMI. Oropharynx is clear. Neck is nontender and supple without adenopathy or JVD. Back is nontender and there is no CVA tenderness. Lungs have generally diminished breath sounds without rales, wheezes, or rhonchi. Chest is nontender. Heart has regular rate and rhythm without murmur. Abdomen is soft, flat, nontender without masses or hepatosplenomegaly and peristalsis is normoactive. Extremities have 1+ edema, full range of motion is present. Skin is warm and dry without rash. Neurologic: Awake and alert and oriented but slow to answer questions and seems confused at times, speech is otherwise normal, cranial nerves are intact, there are no motor or sensory deficits.  ED Results / Procedures / Treatments   Labs (all labs ordered are listed, but only abnormal results are displayed) Labs Reviewed - No data to display  EKG None  Radiology No results found.  Procedures Procedures  CRITICAL CARE Performed by: Delora Fuel Total critical care time: 45 minutes Critical care time was exclusive of separately billable procedures and treating other patients. Critical care was necessary to treat or prevent imminent or life-threatening deterioration. Critical care was time spent personally by me on  the following activities: development of treatment plan with patient and/or surrogate as well as nursing, discussions with consultants, evaluation of patient's response to treatment, examination of patient, obtaining history from patient or surrogate, ordering and performing treatments and interventions, ordering and review of laboratory studies, ordering and review of radiographic studies, pulse oximetry and re-evaluation of patient's condition.  Medications Ordered in ED Medications -  No data to display  ED Course  I have reviewed the triage vital signs and the nursing notes.  Pertinent labs & imaging results that were available during my care of the patient were reviewed by me and considered in my medical decision making (see chart for details).  MDM Rules/Calculators/A&P COVID-19 with worsening symptoms.  He is oxygen dependent which is new for him.  Vital signs meet criteria for sepsis, so code sepsis is initiated.  Unfortunately, the Covid test he had is not in our system, so COVID-19 PCR is sent.  He is empirically started on antibiotics for community-acquired pneumonia.  Old records are reviewed, and he has no relevant past visits.  Last INR was on 9/9 and was slightly supratherapeutic at 3.1.  Chest x-ray is consistent with COVID-19 pneumonia.  Lactic acid level is normal.  Labs show mild hyponatremia and mild elevation of AST not felt to be clinically significant.  INR is subtherapeutic.  Heart rate has come down with IV hydration.  Case is discussed with Dr. Damita Dunnings of [facility], who agrees to admit the patient.  Robert [name] was evaluated in Emergency Department on 03/18/2020 for the symptoms described in the history of present illness. He was evaluated in the context of the global COVID-19 pandemic, which necessitated consideration that the patient might be at risk for infection with the SARS-CoV-2 virus that causes COVID-19. Institutional protocols and algorithms that pertain  to the evaluation of patients at risk for COVID-19 are in a state of rapid change based on information released by regulatory bodies including the CDC and federal and state organizations. These policies and algorithms were followed during the patient's care in the ED.  Final Clinical Impression(s) / ED Diagnoses Final diagnoses:  Pneumonia due to COVID-19 virus  Hyponatremia  Elevated AST (SGOT)  Subtherapeutic international normalized ratio (INR)    Rx / DC Orders ED Discharge Orders    None       Delora Fuel, MD 99/37/16 [phone]

## 2020-03-17 NOTE — ED Notes (Signed)
EDP made aware of Sepsis Alert Criteria.

## 2020-03-18 ENCOUNTER — Encounter (HOSPITAL_COMMUNITY): Payer: Self-pay | Admitting: Internal Medicine

## 2020-03-18 DIAGNOSIS — Z7952 Long term (current) use of systemic steroids: Secondary | ICD-10-CM | POA: Diagnosis not present

## 2020-03-18 DIAGNOSIS — J9600 Acute respiratory failure, unspecified whether with hypoxia or hypercapnia: Secondary | ICD-10-CM | POA: Diagnosis not present

## 2020-03-18 DIAGNOSIS — J1282 Pneumonia due to coronavirus disease 2019: Secondary | ICD-10-CM | POA: Diagnosis present

## 2020-03-18 DIAGNOSIS — Z6841 Body Mass Index (BMI) 40.0 and over, adult: Secondary | ICD-10-CM | POA: Diagnosis not present

## 2020-03-18 DIAGNOSIS — Z86711 Personal history of pulmonary embolism: Secondary | ICD-10-CM | POA: Diagnosis not present

## 2020-03-18 DIAGNOSIS — Z888 Allergy status to other drugs, medicaments and biological substances status: Secondary | ICD-10-CM | POA: Diagnosis not present

## 2020-03-18 DIAGNOSIS — I10 Essential (primary) hypertension: Secondary | ICD-10-CM | POA: Diagnosis present

## 2020-03-18 DIAGNOSIS — R7401 Elevation of levels of liver transaminase levels: Secondary | ICD-10-CM | POA: Diagnosis not present

## 2020-03-18 DIAGNOSIS — Z880 Allergy status to penicillin: Secondary | ICD-10-CM | POA: Diagnosis not present

## 2020-03-18 DIAGNOSIS — R791 Abnormal coagulation profile: Secondary | ICD-10-CM | POA: Diagnosis present

## 2020-03-18 DIAGNOSIS — E785 Hyperlipidemia, unspecified: Secondary | ICD-10-CM | POA: Diagnosis present

## 2020-03-18 DIAGNOSIS — Z9049 Acquired absence of other specified parts of digestive tract: Secondary | ICD-10-CM | POA: Diagnosis not present

## 2020-03-18 DIAGNOSIS — G9341 Metabolic encephalopathy: Secondary | ICD-10-CM | POA: Diagnosis present

## 2020-03-18 DIAGNOSIS — J9601 Acute respiratory failure with hypoxia: Secondary | ICD-10-CM | POA: Diagnosis present

## 2020-03-18 DIAGNOSIS — Z8249 Family history of ischemic heart disease and other diseases of the circulatory system: Secondary | ICD-10-CM | POA: Diagnosis not present

## 2020-03-18 DIAGNOSIS — I70201 Unspecified atherosclerosis of native arteries of extremities, right leg: Secondary | ICD-10-CM | POA: Diagnosis present

## 2020-03-18 DIAGNOSIS — Z882 Allergy status to sulfonamides status: Secondary | ICD-10-CM | POA: Diagnosis not present

## 2020-03-18 DIAGNOSIS — G8929 Other chronic pain: Secondary | ICD-10-CM | POA: Diagnosis present

## 2020-03-18 DIAGNOSIS — Z86718 Personal history of other venous thrombosis and embolism: Secondary | ICD-10-CM | POA: Diagnosis not present

## 2020-03-18 DIAGNOSIS — E871 Hypo-osmolality and hyponatremia: Secondary | ICD-10-CM | POA: Diagnosis present

## 2020-03-18 DIAGNOSIS — Z886 Allergy status to analgesic agent status: Secondary | ICD-10-CM | POA: Diagnosis not present

## 2020-03-18 DIAGNOSIS — Z885 Allergy status to narcotic agent status: Secondary | ICD-10-CM | POA: Diagnosis not present

## 2020-03-18 DIAGNOSIS — U071 COVID-19: Secondary | ICD-10-CM

## 2020-03-18 DIAGNOSIS — J45909 Unspecified asthma, uncomplicated: Secondary | ICD-10-CM | POA: Diagnosis present

## 2020-03-18 DIAGNOSIS — R197 Diarrhea, unspecified: Secondary | ICD-10-CM | POA: Diagnosis present

## 2020-03-18 LAB — CBC WITH DIFFERENTIAL/PLATELET
Abs Immature Granulocytes: 0.04 10*3/uL (ref 0.00–0.07)
Basophils Absolute: 0 10*3/uL (ref 0.0–0.1)
Basophils Relative: 0 %
Eosinophils Absolute: 0 10*3/uL (ref 0.0–0.5)
Eosinophils Relative: 0 %
HCT: 48.7 % (ref 39.0–52.0)
Hemoglobin: 16.4 g/dL (ref 13.0–17.0)
Immature Granulocytes: 1 %
Lymphocytes Relative: 16 %
Lymphs Abs: 0.7 10*3/uL (ref 0.7–4.0)
MCH: 32.2 pg (ref 26.0–34.0)
MCHC: 33.7 g/dL (ref 30.0–36.0)
MCV: 95.5 fL (ref 80.0–100.0)
Monocytes Absolute: 0.5 10*3/uL (ref 0.1–1.0)
Monocytes Relative: 10 %
Neutro Abs: 3.3 10*3/uL (ref 1.7–7.7)
Neutrophils Relative %: 73 %
Platelets: 179 10*3/uL (ref 150–400)
RBC: 5.1 MIL/uL (ref 4.22–5.81)
RDW: 12.5 % (ref 11.5–15.5)
WBC: 4.6 10*3/uL (ref 4.0–10.5)
nRBC: 0 % (ref 0.0–0.2)

## 2020-03-18 LAB — COMPREHENSIVE METABOLIC PANEL
ALT: 36 U/L (ref 0–44)
AST: 53 U/L — ABNORMAL HIGH (ref 15–41)
Albumin: 3.3 g/dL — ABNORMAL LOW (ref 3.5–5.0)
Alkaline Phosphatase: 45 U/L (ref 38–126)
Anion gap: 13 (ref 5–15)
BUN: 15 mg/dL (ref 8–23)
CO2: 26 mmol/L (ref 22–32)
Calcium: 8.7 mg/dL — ABNORMAL LOW (ref 8.9–10.3)
Chloride: 92 mmol/L — ABNORMAL LOW (ref 98–111)
Creatinine, Ser: 1.21 mg/dL (ref 0.61–1.24)
GFR calc Af Amer: >60 mL/min (ref >60)
GFR calc non Af Amer: >60 mL/min (ref >60)
Glucose, Bld: 137 mg/dL — ABNORMAL HIGH (ref 70–99)
Potassium: 4.7 mmol/L (ref 3.5–5.1)
Sodium: 131 mmol/L — ABNORMAL LOW (ref 135–145)
Total Bilirubin: 1 mg/dL (ref 0.3–1.2)
Total Protein: 7.6 g/dL (ref 6.5–8.1)

## 2020-03-18 LAB — URINALYSIS, ROUTINE W REFLEX MICROSCOPIC
Bilirubin Urine: NEGATIVE
Glucose, UA: NEGATIVE mg/dL
Ketones, ur: NEGATIVE mg/dL
Leukocytes,Ua: NEGATIVE
Nitrite: NEGATIVE
Protein, ur: 100 mg/dL — AB
Specific Gravity, Urine: >1.03 — ABNORMAL HIGH (ref 1.005–1.030)
pH: 6 (ref 5.0–8.0)

## 2020-03-18 LAB — RESPIRATORY PANEL BY RT PCR (FLU A&B, COVID)
Influenza A by PCR: NEGATIVE
Influenza B by PCR: NEGATIVE
SARS Coronavirus 2 by RT PCR: POSITIVE — AB

## 2020-03-18 LAB — HIV ANTIBODY (ROUTINE TESTING W REFLEX): HIV Screen 4th Generation wRfx: NONREACTIVE

## 2020-03-18 LAB — APTT: aPTT: 44 seconds — ABNORMAL HIGH (ref 24–36)

## 2020-03-18 LAB — URINALYSIS, MICROSCOPIC (REFLEX)

## 2020-03-18 LAB — PROTIME-INR
INR: 1.4 — ABNORMAL HIGH (ref 0.8–1.2)
Prothrombin Time: 17.1 seconds — ABNORMAL HIGH (ref 11.4–15.2)

## 2020-03-18 LAB — LACTIC ACID, PLASMA
Lactic Acid, Venous: 1.6 mmol/L (ref 0.5–1.9)
Lactic Acid, Venous: 1.9 mmol/L (ref 0.5–1.9)

## 2020-03-18 LAB — D-DIMER, QUANTITATIVE: D-Dimer, Quant: 0.42 ug/mL-FEU (ref 0.00–0.50)

## 2020-03-18 LAB — PROCALCITONIN: Procalcitonin: 0.12 ng/mL

## 2020-03-18 LAB — FERRITIN: Ferritin: 1024 ng/mL — ABNORMAL HIGH (ref 24–336)

## 2020-03-18 LAB — C-REACTIVE PROTEIN: CRP: 7.1 mg/dL — ABNORMAL HIGH (ref <1.0)

## 2020-03-18 MED ORDER — BARICITINIB 2 MG PO TABS
4.0000 mg | ORAL_TABLET | Freq: Every day | ORAL | Status: DC
  Administered 2020-03-18 – 2020-03-22 (×5): 4 mg via ORAL
  Filled 2020-03-18 (×4): qty 2

## 2020-03-18 MED ORDER — FLUTICASONE PROPIONATE 50 MCG/ACT NA SUSP
2.0000 | Freq: Every day | NASAL | Status: DC | PRN
  Filled 2020-03-18: qty 16

## 2020-03-18 MED ORDER — ADULT MULTIVITAMIN W/MINERALS CH
1.0000 | ORAL_TABLET | Freq: Every day | ORAL | Status: DC
  Administered 2020-03-18 – 2020-03-22 (×5): 1 via ORAL
  Filled 2020-03-18 (×5): qty 1

## 2020-03-18 MED ORDER — ONDANSETRON HCL 4 MG PO TABS: 4.0000 mg | ORAL_TABLET | Freq: Four times a day (QID) | ORAL | Status: DC | PRN

## 2020-03-18 MED ORDER — OMEGA-3-ACID ETHYL ESTERS 1 G PO CAPS
1.0000 g | ORAL_CAPSULE | Freq: Every day | ORAL | Status: DC
  Administered 2020-03-18 – 2020-03-22 (×5): 1 g via ORAL
  Filled 2020-03-18 (×5): qty 1

## 2020-03-18 MED ORDER — WARFARIN SODIUM 7.5 MG PO TABS
7.5000 mg | ORAL_TABLET | Freq: Once | ORAL | Status: AC
  Administered 2020-03-18: 7.5 mg via ORAL
  Filled 2020-03-18: qty 1

## 2020-03-18 MED ORDER — GUAIFENESIN-DM 100-10 MG/5ML PO SYRP
10.0000 mL | ORAL_SOLUTION | ORAL | Status: DC | PRN
  Administered 2020-03-20: 10 mL via ORAL
  Filled 2020-03-18: qty 10

## 2020-03-18 MED ORDER — OMEGA-3 FATTY ACIDS 1000 MG PO CAPS: 2.0000 g | ORAL_CAPSULE | Freq: Every morning | ORAL | Status: DC

## 2020-03-18 MED ORDER — ASCORBIC ACID 500 MG PO TABS
500.0000 mg | ORAL_TABLET | Freq: Every day | ORAL | Status: DC
  Administered 2020-03-18 – 2020-03-22 (×5): 500 mg via ORAL
  Filled 2020-03-18 (×5): qty 1

## 2020-03-18 MED ORDER — METHYLPREDNISOLONE SODIUM SUCC 125 MG IJ SOLR
70.0000 mg | Freq: Two times a day (BID) | INTRAMUSCULAR | Status: DC
  Administered 2020-03-18 – 2020-03-19 (×3): 70 mg via INTRAVENOUS
  Filled 2020-03-18 (×3): qty 2

## 2020-03-18 MED ORDER — SODIUM CHLORIDE 0.9 % IV SOLN
100.0000 mg | Freq: Every day | INTRAVENOUS | Status: AC
  Administered 2020-03-19 – 2020-03-22 (×4): 100 mg via INTRAVENOUS
  Filled 2020-03-18 (×5): qty 20

## 2020-03-18 MED ORDER — WARFARIN - PHARMACIST DOSING INPATIENT: Freq: Every day | Status: DC

## 2020-03-18 MED ORDER — PREDNISONE 20 MG PO TABS: 50.0000 mg | ORAL_TABLET | Freq: Every day | ORAL | Status: DC

## 2020-03-18 MED ORDER — LOSARTAN POTASSIUM 50 MG PO TABS
50.0000 mg | ORAL_TABLET | Freq: Every day | ORAL | Status: DC
  Administered 2020-03-18: 50 mg via ORAL
  Filled 2020-03-18: qty 1

## 2020-03-18 MED ORDER — SODIUM CHLORIDE 0.9 % IV SOLN: 1.0000 g | INTRAVENOUS | Status: DC

## 2020-03-18 MED ORDER — ZINC SULFATE 220 (50 ZN) MG PO CAPS
220.0000 mg | ORAL_CAPSULE | Freq: Every day | ORAL | Status: DC
  Administered 2020-03-18 – 2020-03-22 (×5): 220 mg via ORAL
  Filled 2020-03-18 (×5): qty 1

## 2020-03-18 MED ORDER — ONDANSETRON HCL 4 MG/2ML IJ SOLN: 4.0000 mg | Freq: Four times a day (QID) | INTRAMUSCULAR | Status: DC | PRN

## 2020-03-18 MED ORDER — ACETAMINOPHEN 325 MG PO TABS: 650.0000 mg | ORAL_TABLET | Freq: Four times a day (QID) | ORAL | Status: DC | PRN

## 2020-03-18 MED ORDER — FUROSEMIDE 20 MG PO TABS
20.0000 mg | ORAL_TABLET | Freq: Every day | ORAL | Status: DC
  Administered 2020-03-18 – 2020-03-22 (×5): 20 mg via ORAL
  Filled 2020-03-18 (×5): qty 1

## 2020-03-18 MED ORDER — NORTRIPTYLINE HCL 10 MG PO CAPS
10.0000 mg | ORAL_CAPSULE | Freq: Every evening | ORAL | Status: DC
  Administered 2020-03-18 – 2020-03-21 (×4): 10 mg via ORAL
  Filled 2020-03-18 (×5): qty 1

## 2020-03-18 MED ORDER — SODIUM CHLORIDE 0.9 % IV SOLN
500.0000 mg | INTRAVENOUS | Status: DC
  Filled 2020-03-18: qty 500

## 2020-03-18 MED ORDER — ENOXAPARIN SODIUM 150 MG/ML ~~LOC~~ SOLN
150.0000 mg | Freq: Two times a day (BID) | SUBCUTANEOUS | Status: DC
  Administered 2020-03-18 – 2020-03-19 (×4): 150 mg via SUBCUTANEOUS
  Filled 2020-03-18 (×5): qty 1

## 2020-03-18 MED ORDER — SODIUM CHLORIDE 0.9 % IV SOLN
200.0000 mg | Freq: Once | INTRAVENOUS | Status: AC
  Administered 2020-03-18: 200 mg via INTRAVENOUS
  Filled 2020-03-18: qty 40

## 2020-03-18 MED ORDER — ALBUTEROL SULFATE HFA 108 (90 BASE) MCG/ACT IN AERS
2.0000 | INHALATION_SPRAY | Freq: Four times a day (QID) | RESPIRATORY_TRACT | Status: DC
  Administered 2020-03-18 – 2020-03-22 (×17): 2 via RESPIRATORY_TRACT
  Filled 2020-03-18: qty 6.7

## 2020-03-18 MED ORDER — SENNOSIDES-DOCUSATE SODIUM 8.6-50 MG PO TABS: 1.0000 | ORAL_TABLET | Freq: Every evening | ORAL | Status: DC | PRN

## 2020-03-18 MED ORDER — INFLUENZA VAC A&B SA ADJ QUAD 0.5 ML IM PRSY
0.5000 mL | PREFILLED_SYRINGE | INTRAMUSCULAR | Status: DC
  Filled 2020-03-18: qty 0.5

## 2020-03-18 NOTE — Progress Notes (Signed)
PROGRESS NOTE                                                                                                                                                                                                             Patient Demographics:    Robert [name], is a 69 y.o. male, DOB - 03/26/1951, EHM:[id number]  Admit date - 03/17/2020   Admitting Physician Athena Masse, MD  Outpatient Primary MD for the patient is [name], [name], MD  LOS - 0   Chief Complaint  Patient presents with  . COVID-19  . Fatigue  . Shortness of Breath       Brief Narrative    This is a no charge note, patient was seen and admitted earlier today, chart and imaging and labs were reviewed.  HPI: Robert [name] is a 69 y.o. male with medical history significant for HTN, asthma, morbid obesity history of PE on Coumadin, diagnosed with Covid on 9/18 who was brought to the emergency room with increasing shortness of breath, weakness and generalized malaise with family reporting intermittent confusion at home.  Patient is unvaccinated.  He endorses fever.  He denies chest pain.  On arrival of EMS O2 sat was 85% on room air requiring 6 L to maintain sats in the mid 90s. ED Course: On arrival, patient was ill-appearing but was able to sufficiently oriented to provide history.  Had increased work of breathing, tachypneic at 26 with O2 sat 96% on 6 L.  Febrile at 101.6 and tachycardic at 137.  BP 130/77.  On his blood work, WBC 4600, lactic acid 1.6.  He had some mild electrolyte abnormalities, INR subtherapeutic at 1.4.   Chest x-ray: Showed multifocal pneumonia. EKG as reviewed by me : Sinus tachycardia with no acute ST-T wave changes Patient met sepsis criteria from the ER and was treated empirically for bacterial pneumonia with Levaquin and given a lactic acid fluid bolus.  Hospitalist consulted for admission.   Subjective:    Robert [name] today for generalized  weakness, fatigue, shortness of breath and cough .     Assessment  & Plan :    Principal Problem:   Acute respiratory failure due to COVID-19 [facility]) Active Problems:   Obesity, Class III, BMI 40-49.9 (morbid obesity) (HCC)   Asthma   History of pulmonary embolism  Long term (current) use of anticoagulants   Essential hypertension   Pneumonia due to COVID-19 virus     Acute respiratory failure due to COVID-19 [facility])   Pneumonia due to COVID-19 virus -Diagnosed with COVID-19 on 9/18 -O2 sat 85% on room air with EMS requiring 6 L and patient with increased work of breathing unable to speak in full sentences -Remdesivir, methylprednisolone, albuterol, antitussives and multivitamins -Supplemental oxygen to keep sats over 92% -Proning as tolerated -Follow inflammatory biomarkers -I have discussed with the patient, no history of TB, diverticulitis, or viral hepatitis, discussed risks and benefits about baricitinib, his agreeable, will start on baricitinib. -Procalcitonin 0.12, no indication for antibiotics, will stop Rocephin and azithromycin -Sepsis ruled out.    Acute confusion -Reported by family but patient oriented x4 on arrival likely secondary to acute infection above -Neurochecks with fall and aspiration precautions    Asthma -Albuterol as needed    History of pulmonary saddle embolism Warfarin Long term (current) use with subtherapeutic INR -Therapeutic/full dose Lovenox given subtherapeutic INR of 1.4 -Pharmacy consult for Coumadin dosing    Essential hypertension -Continue home losartan     COVID-19 Labs  Recent Labs    03/18/20 0412  DDIMER 0.42  FERRITIN 1,024*  CRP 7.1*    Lab Results  Component Value Date   SARSCOV2NAA POSITIVE (A) 03/18/2020   No charge note  Lab Results  Component Value Date   PLT 179 03/18/2020    Antibiotics  :    Anti-infectives (From admission, onward)   Start     Dose/Rate Route Frequency Ordered Stop    03/19/20 1000  remdesivir 100 mg in sodium chloride 0.9 % 100 mL IVPB       "Followed by" Linked Group Details   100 mg 200 mL/hr over 30 Minutes Intravenous Daily 03/18/20 0303 03/23/20 0959   03/18/20 2200  cefTRIAXone (ROCEPHIN) 1 g in sodium chloride 0.9 % 100 mL IVPB        1 g 200 mL/hr over 30 Minutes Intravenous Every 24 hours 03/18/20 0013     03/18/20 2200  azithromycin (ZITHROMAX) 500 mg in sodium chloride 0.9 % 250 mL IVPB        500 mg 250 mL/hr over 60 Minutes Intravenous Every 24 hours 03/18/20 0013     03/18/20 0345  remdesivir 200 mg in sodium chloride 0.9% 250 mL IVPB       "Followed by" Linked Group Details   200 mg 580 mL/hr over 30 Minutes Intravenous Once 03/18/20 0303 03/18/20 0502   03/17/20 2330  levofloxacin (LEVAQUIN) IVPB 750 mg        750 mg 100 mL/hr over 90 Minutes Intravenous  Once 03/17/20 2329 03/18/20 0143        Objective:   Vitals:   03/18/20 0329 03/18/20 0415 03/18/20 0823 03/18/20 1200  BP: 106/82 (!) 112/58 120/77 115/70  Pulse: 65  91 97  Resp: 20 19 (!) 24   Temp:  100.1 F (37.8 C) 99.6 F (37.6 C) 98.4 F (36.9 C)  TempSrc:  Oral Oral Oral  SpO2: 97%  92%   Weight:  (!) 147.3 kg    Height:        Wt Readings from Last 3 Encounters:  03/18/20 (!) 147.3 kg  02/01/20 (!) 149.2 kg  12/21/19 (!) 146.1 kg     Intake/Output Summary (Last 24 hours) at 03/18/2020 1505 Last data filed at 03/18/2020 1054 Gross per 24 hour  Intake 2300 ml  Output  1600 ml  Net 700 ml     Physical Exam  Awake Alert, Oriented X 3, No new F.N deficits, Normal affect Symmetrical Chest wall movement, Good air movement bilaterally, CTAB Tachycardic, regular no Gallops,Rubs or new Murmurs, No Parasternal Heave +ve B.Sounds, Abd Soft, No tenderness, No organomegaly appriciated, No rebound - guarding or rigidity. No Cyanosis, Clubbing or edema, No new Rash or bruise     Data Review:    CBC Recent Labs  Lab 03/18/20 0011  WBC 4.6  HGB 16.4    HCT 48.7  PLT 179  MCV 95.5  MCH 32.2  MCHC 33.7  RDW 12.5  LYMPHSABS 0.7  MONOABS 0.5  EOSABS 0.0  BASOSABS 0.0    Chemistries  Recent Labs  Lab 03/18/20 0011  NA 131*  K 4.7  CL 92*  CO2 26  GLUCOSE 137*  BUN 15  CREATININE 1.21  CALCIUM 8.7*  AST 53*  ALT 36  ALKPHOS 45  BILITOT 1.0   ------------------------------------------------------------------------------------------------------------------ No results for input(s): CHOL, HDL, LDLCALC, TRIG, CHOLHDL, LDLDIRECT in the last 72 hours.  Lab Results  Component Value Date   HGBA1C 6.0 12/21/2019   ------------------------------------------------------------------------------------------------------------------ No results for input(s): TSH, T4TOTAL, T3FREE, THYROIDAB in the last 72 hours.  Invalid input(s): FREET3 ------------------------------------------------------------------------------------------------------------------ Recent Labs    03/18/20 0412  FERRITIN 1,024*    Coagulation profile Recent Labs  Lab 03/18/20 0011  INR 1.4*    Recent Labs    03/18/20 0412  DDIMER 0.42    Cardiac Enzymes No results for input(s): CKMB, TROPONINI, MYOGLOBIN in the last 168 hours.  Invalid input(s): CK ------------------------------------------------------------------------------------------------------------------ No results found for: BNP  Inpatient Medications  Scheduled Meds: . albuterol  2 puff Inhalation Q6H WA  . vitamin C  500 mg Oral Daily  . baricitinib  4 mg Oral Daily  . enoxaparin (LOVENOX) injection  150 mg Subcutaneous Q12H  . furosemide  20 mg Oral Daily  . [START ON 03/19/2020] influenza vaccine adjuvanted  0.5 mL Intramuscular Tomorrow-1000  . losartan  50 mg Oral Daily  . methylPREDNISolone (SOLU-MEDROL) injection  70 mg Intravenous Q12H   Followed by  . [START ON 03/21/2020] predniSONE  50 mg Oral Daily  . multivitamin with minerals  1 tablet Oral Daily  . nortriptyline  10  mg Oral QPM  . omega-3 acid ethyl esters  1 g Oral Daily  . warfarin  7.5 mg Oral ONCE-1600  . Warfarin - Pharmacist Dosing Inpatient   Does not apply q1600  . zinc sulfate  220 mg Oral Daily   Continuous Infusions: . azithromycin    . cefTRIAXone (ROCEPHIN)  IV    . lactated ringers 150 mL/hr at 03/18/20 0008  . [START ON 03/19/2020] remdesivir 100 mg in NS 100 mL     PRN Meds:.acetaminophen, fluticasone, guaiFENesin-dextromethorphan, ondansetron **OR** ondansetron (ZOFRAN) IV, senna-docusate  Micro Results Recent Results (from the past 240 hour(s))  Blood Culture (routine x 2)     Status: None (Preliminary result)   Collection Time: 03/18/20 12:11 AM   Specimen: BLOOD  Result Value Ref Range Status   Specimen Description BLOOD RIGHT ARM  Final   Special Requests   Final    BOTTLES DRAWN AEROBIC AND ANAEROBIC Blood Culture adequate volume   Culture   Final    NO GROWTH < 12 HOURS Performed at [facility], [address], [place], [place] [address]    Report Status PENDING  Incomplete  Blood Culture (routine x 2)  Status: None (Preliminary result)   Collection Time: 03/18/20 12:11 AM   Specimen: BLOOD  Result Value Ref Range Status   Specimen Description BLOOD LEFT HAND  Final   Special Requests   Final    BOTTLES DRAWN AEROBIC AND ANAEROBIC Blood Culture results may not be optimal due to an inadequate volume of blood received in culture bottles   Culture   Final    NO GROWTH < 12 HOURS Performed at [facility], [address], [place], [place] [address]    Report Status PENDING  Incomplete  Respiratory Panel by RT PCR (Flu A&B, Covid) - Nasopharyngeal Swab     Status: Abnormal   Collection Time: 03/18/20 12:11 AM   Specimen: Nasopharyngeal Swab  Result Value Ref Range Status   SARS Coronavirus 2 by RT PCR POSITIVE (A) NEGATIVE Final    Comment: RESULT CALLED TO, READ BACK BY AND VERIFIED WITH: L. VENEGAS,RN 0146 03/18/2020 T. TYSOR (NOTE) SARS-CoV-2  target nucleic acids are DETECTED.  SARS-CoV-2 RNA is generally detectable in upper respiratory specimens  during the acute phase of infection. Positive results are indicative of the presence of the identified virus, but do not rule out bacterial infection or co-infection with other pathogens not detected by the test. Clinical correlation with patient history and other diagnostic information is necessary to determine patient infection status. The expected result is Negative.  Fact Sheet for Patients:  PinkCheek.be  Fact Sheet for Healthcare Providers: GravelBags.it  This test is not yet approved or cleared by the [place] FDA and  has been authorized for detection and/or diagnosis of SARS-CoV-2 by FDA under an Emergency Use Authorization (EUA).  This EUA will remain in effect (meaning this test can  be used) for the duration of  the COVID-19 declaration under Section 564(b)(1) of the Act, 21 U.S.C. section 360bbb-3(b)(1), unless the authorization is terminated or revoked sooner.      Influenza A by PCR NEGATIVE NEGATIVE Final   Influenza B by PCR NEGATIVE NEGATIVE Final    Comment: (NOTE) The Xpert Xpress SARS-CoV-2/FLU/RSV assay is intended as an aid in  the diagnosis of influenza from Nasopharyngeal swab specimens and  should not be used as a sole basis for treatment. Nasal washings and  aspirates are unacceptable for Xpert Xpress SARS-CoV-2/FLU/RSV  testing.  Fact Sheet for Patients: PinkCheek.be  Fact Sheet for Healthcare Providers: GravelBags.it  This test is not yet approved or cleared by the [place] FDA and  has been authorized for detection and/or diagnosis of SARS-CoV-2 by  FDA under an Emergency Use Authorization (EUA). This EUA will remain  in effect (meaning this test can be used) for the duration of the  Covid-19 declaration under  Section 564(b)(1) of the Act, 21  U.S.C. section 360bbb-3(b)(1), unless the authorization is  terminated or revoked. Performed at [facility], [place] [address] [place],  [address]     Radiology Reports DG Chest Railroad 1 View  Result Date: 03/18/2020 CLINICAL DATA:  Worsening shortness of breath.  COVID positive. EXAM: PORTABLE CHEST 1 VIEW COMPARISON:  03/31/2013 FINDINGS: Cardiac enlargement. Patchy peripheral and basilar infiltration with peribronchial thickening compatible with multifocal pneumonia. No pleural effusions. No pneumothorax. Mediastinal contours appear intact. IMPRESSION: Cardiac enlargement. Patchy bilateral pulmonary infiltrates compatible with multifocal pneumonia. Electronically Signed   By: Lucienne Capers M.D.   On: 03/18/2020 00:12      Phillips Climes M.D on 03/18/2020 at 3:05 PM    [facility] -  Office  [phone]

## 2020-03-18 NOTE — H&P (Signed)
History and Physical    Robert [name]:[id number] DOB: 1950/12/21 DOA: 03/17/2020  PCP: Cassandria Anger, MD   Patient coming from: Home  I have personally briefly reviewed patient's old medical records in [name]  Chief Complaint: Shortness of breath, altered mental status  HPI: Robert [name] is a 69 y.o. male with medical history significant for HTN, asthma, morbid obesity history of PE on Coumadin, diagnosed with Covid on 9/18 who was brought to the emergency room with increasing shortness of breath, weakness and generalized malaise with family reporting intermittent confusion at home.  Patient is unvaccinated.  He endorses fever.  He denies chest pain.  On arrival of EMS O2 sat was 85% on room air requiring 6 L to maintain sats in the mid 90s. ED Course: On arrival, patient was ill-appearing but was able to sufficiently oriented to provide history.  Had increased work of breathing, tachypneic at 26 with O2 sat 96% on 6 L.  Febrile at 101.6 and tachycardic at 137.  BP 130/77.  On his blood work, WBC 4600, lactic acid 1.6.  He had some mild electrolyte abnormalities, INR subtherapeutic at 1.4.   Chest x-ray: Showed multifocal pneumonia. EKG as reviewed by me : Sinus tachycardia with no acute ST-T wave changes Patient met sepsis criteria from the ER and was treated empirically for bacterial pneumonia with Levaquin and given a lactic acid fluid bolus.  Hospitalist consulted for admission.  Review of Systems: As per HPI otherwise all other systems on review of systems negative.    Past Medical History:  Diagnosis Date  . Anxiety   . Asthma    "no attack since acupuncture in 1990's"  . Complication of anesthesia    SLOW TO WAKE UP / DIFFICULTY RESPONDING 2003, 3 days before could use left arm, "wild/crazy sometimes"  . Cyst of left kidney    "benign"  . DVT (deep venous thrombosis) ([place]) 2003   right femoral artery "from groin to knee"  . Edema    Right Leg w/  h/o DVT postphlebitic  . H/O blood transfusion reaction 2004   FFP, extreme swelling, could not breath  . Headache(784.0)    MIGRAINES-not for a long time  . History of pulmonary embolism 2003   both lungs  . Hyperlipidemia   . Knee pain    left  . LBP (low back pain)   . 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 Obesity   . Osteoarthritis   . Peripheral vascular disease ([place])    "poor circulation in  RT leg"  . PONV (postoperative nausea and vomiting)    during surgery in 1990's  . Spondylolisthesis   . Warfarin anticoagulation   . Wheezing    when laying on left side    Past Surgical History:  Procedure Laterality Date  . APPENDECTOMY  1993   Dr Margot Chimes  . CHOLECYSTECTOMY  1993  . FINGER SURGERY  2012   RT THUMB RECONSTRUCTION  . HAND SURGERY Right    abcess  . HEMORROIDECTOMY  1981  . I & D KNEE WITH POLY EXCHANGE Left 04/04/2013   Procedure: IRRIGATION AND DEBRIDEMENT KNEE WITH POLY EXCHANGE AND INSERTION OF CEMENT BEADS;  Surgeon: Gearlean Alf, MD;  Location: [facility];  Service: Orthopedics;  Laterality: Left;  . KNEE ARTHROSCOPY  1990   RT KNEE  . KNEE ARTHROSCOPY Right 2003  . KNEE ARTHROSCOPY  1988   L KNEE  .  KNEE ARTHROSCOPY WITH MENISCAL REPAIR Left 10/22/2012   Procedure: LEFT KNEE ARTHROSCOPY PARTIAL MEDIAL AND LATERAL MENISCECTOMY AND DEBRIDEMENT, CHONDROPLASTY ;  Surgeon: Javier Docker, MD;  Location: [facility];  Service: Orthopedics;  Laterality: Left;  . LUMBAR DISC SURGERY    . LUMBAR FUSION  2003   Dr Jillyn Hidden  . ORCHIECTOMY  1994   R post injury  . TONSILLECTOMY  1967  . TOTAL KNEE ARTHROPLASTY Left 03/21/2013   Procedure: LEFT TOTAL KNEE ARTHROPLASTY;  Surgeon: Loanne Drilling, MD;  Location: [facility];  Service: Orthopedics;  Laterality: Left;     reports that he has never smoked. He has never used smokeless tobacco. He reports that he does not drink alcohol and does not use drugs.  Allergies  Allergen Reactions  . Cat Hair Extract  Itching  . Diltiazem Anaphylaxis  . Sulfa Antibiotics Hives and Swelling  . Anoro Ellipta [Umeclidinium-Vilanterol]     Too much mucus  . Breo Ellipta [Fluticasone Furoate-Vilanterol]     Too much mucus  . Exalgo [Hydromorphone Hcl] Nausea Only    Sick and nauseated  . Fentanyl Other (See Comments)    REACTION: too sleepy  . Gabapentin   . Hydrocodone-Acetaminophen Nausea Only    REACTION: nausea  . Lopressor [Metoprolol Tartrate]     Asthmatic issues   . Penicillins Hives  . Sulfonamide Derivatives Hives and Swelling  . Tylenol [Acetaminophen]     Liver issues    Family History  Problem Relation Age of Onset  . Heart disease Mother        CABG at age 60  . Hyperlipidemia Other   . Hypertension Other   . Parkinsonism Other   . Heart disease Sister       Prior to Admission medications   Medication Sig Start Date End Date Taking? Authorizing Provider  albuterol (PROVENTIL) (2.5 MG/3ML) 0.083% nebulizer solution Take 3 mLs (2.5 mg total) by nebulization every 4 (four) hours as needed for wheezing or shortness of breath. Dx  J45.909 02/16/20  Yes [name], Georgina Quint, MD  Cholecalciferol (VITAMIN D3) 125 MCG (5000 UT) CAPS Take 1 capsule (5,000 Units total) by mouth daily. 12/25/19  Yes [name], Georgina Quint, MD  Cyanocobalamin (VITAMIN B-12) 5000 MCG SUBL Place 5,000 tablets under the tongue every morning.   Yes [provider]  fish oil-omega-3 fatty acids 1000 MG capsule Take 2 g by mouth every morning.   Yes [provider]  fluticasone (FLONASE) 50 MCG/ACT nasal spray Place 2 sprays into both nostrils daily. Patient taking differently: Place 2 sprays into both nostrils daily as needed for allergies.  04/12/18  Yes [name], Georgina Quint, MD  furosemide (LASIX) 20 MG tablet Take 1 tablet (20 mg total) by mouth daily. 07/12/19  Yes [name], Georgina Quint, MD  glucosamine-chondroitin 500-400 MG tablet Take 1 tablet by mouth every morning.   Yes [provider]  losartan (COZAAR) 50 MG tablet Take 1 tablet (50 mg total) by mouth daily. 07/15/19  Yes Jodelle Red, MD  nortriptyline (PAMELOR) 10 MG capsule Take 1 capsule (10 mg total) by mouth every evening. 07/12/19  Yes [name], Georgina Quint, MD  predniSONE (DELTASONE) 5 MG tablet TAKE 1 TABLET DAILY WITH BREAKFAST Patient taking differently: Take 5 mg by mouth daily with breakfast. TAKE 1 TABLET DAILY WITH BREAKFAST 03/12/20  Yes [name], Georgina Quint, MD  warfarin (COUMADIN) 3 MG tablet Take 1 tablet on Mon Wed and Fri or as directed by anticoagulation clinic 02/01/20  Yes [name], [name], MD  Nebulizers (PORTABLE COMPRESSOR NEBULIZER) MISC 3 mLs by Does not apply route every 4 (four) hours. 10/07/19   [name], [name], MD  warfarin (COUMADIN) 6 MG tablet As directed Patient taking differently: Take 6 mg by mouth See admin instructions. As directed; Lenna Sciara and [facility] 02/01/20   [name], [name], MD    Physical Exam: Vitals:   03/18/20 0130 03/18/20 0145 03/18/20 0215 03/18/20 0231  BP: 119/76 122/79 (!) 112/49 114/64  Pulse: (!) 132 65 65 80  Resp: (!) 26 (!) 26 (!) 29 (!) 25  Temp:      TempSrc:      SpO2: 98% 97% 98% 99%  Weight:      Height:         Vitals:   03/18/20 0130 03/18/20 0145 03/18/20 0215 03/18/20 0231  BP: 119/76 122/79 (!) 112/49 114/64  Pulse: (!) 132 65 65 80  Resp: (!) 26 (!) 26 (!) 29 (!) 25  Temp:      TempSrc:      SpO2: 98% 97% 98% 99%  Weight:      Height:          Constitutional:  Lethargic, and ill-appearing but oriented x 3 .  Tachypneic with increased work of breathing unable to speak in complete sentences  HEENT:      Head: Normocephalic and atraumatic.         Eyes: PERLA, EOMI, Conjunctivae are normal. Sclera is non-icteric.       Mouth/Throat: Mucous membranes are moist.       Neck: Supple with no signs of meningismus. Cardiovascular:  Tachycardic. No murmurs, gallops, or rubs. 2+ symmetrical distal pulses are present . No  JVD. No LE edema Respiratory: Respiratory effort increased, speaking in 3 word sentences.  Breath sounds diminished bilaterally, with scattered wheezes and coarse breath sounds Gastrointestinal: Soft, non tender, and non distended with positive bowel sounds. No rebound or guarding. Genitourinary: No CVA tenderness. Musculoskeletal: Nontender with normal range of motion in all extremities. No cyanosis, or erythema of extremities. Neurologic:  Face is symmetric. Moving all extremities. No gross focal neurologic deficits . Skin: Skin is warm, dry.  No rash or ulcers Psychiatric: Mood and affect are normal    Labs on Admission: I have personally reviewed following labs and imaging studies  CBC: Recent Labs  Lab 03/18/20 0011  WBC 4.6  NEUTROABS 3.3  HGB 16.4  HCT 48.7  MCV 95.5  PLT 476   Basic Metabolic Panel: Recent Labs  Lab 03/18/20 0011  NA 131*  K 4.7  CL 92*  CO2 26  GLUCOSE 137*  BUN 15  CREATININE 1.21  CALCIUM 8.7*   GFR: Estimated Creatinine Clearance: 85 mL/min (by C-G formula based on SCr of 1.21 mg/dL). Liver Function Tests: Recent Labs  Lab 03/18/20 0011  AST 53*  ALT 36  ALKPHOS 45  BILITOT 1.0  PROT 7.6  ALBUMIN 3.3*   No results for input(s): LIPASE, AMYLASE in the last 168 hours. No results for input(s): AMMONIA in the last 168 hours. Coagulation Profile: Recent Labs  Lab 03/18/20 0011  INR 1.4*   Cardiac Enzymes: No results for input(s): CKTOTAL, CKMB, CKMBINDEX, TROPONINI in the last 168 hours. BNP (last 3 results) No results for input(s): PROBNP in the last 8760 hours. HbA1C: No results for input(s): HGBA1C in the last 72 hours. CBG: No results for input(s): GLUCAP in the last 168 hours. Lipid Profile: No results for  input(s): CHOL, HDL, LDLCALC, TRIG, CHOLHDL, LDLDIRECT in the last 72 hours. Thyroid Function Tests: No results for input(s): TSH, T4TOTAL, FREET4, T3FREE, THYROIDAB in the last 72 hours. Anemia Panel: No results for  input(s): VITAMINB12, FOLATE, FERRITIN, TIBC, IRON, RETICCTPCT in the last 72 hours. Urine analysis:    Component Value Date/Time   COLORURINE YELLOW 03/30/2019 0924   APPEARANCEUR CLEAR 03/30/2019 0924   LABSPEC 1.025 03/30/2019 0924   PHURINE 6.0 03/30/2019 0924   GLUCOSEU NEGATIVE 03/30/2019 0924   HGBUR TRACE-LYSED (A) 03/30/2019 0924   BILIRUBINUR NEGATIVE 03/30/2019 0924   KETONESUR NEGATIVE 03/30/2019 0924   PROTEINUR NEGATIVE 03/31/2013 1943   UROBILINOGEN 0.2 03/30/2019 0924   NITRITE NEGATIVE 03/30/2019 0924   LEUKOCYTESUR NEGATIVE 03/30/2019 0924    Radiological Exams on Admission: DG Chest Port 1 View  Result Date: 03/18/2020 CLINICAL DATA:  Worsening shortness of breath.  COVID positive. EXAM: PORTABLE CHEST 1 VIEW COMPARISON:  03/31/2013 FINDINGS: Cardiac enlargement. Patchy peripheral and basilar infiltration with peribronchial thickening compatible with multifocal pneumonia. No pleural effusions. No pneumothorax. Mediastinal contours appear intact. IMPRESSION: Cardiac enlargement. Patchy bilateral pulmonary infiltrates compatible with multifocal pneumonia. Electronically Signed   By: Lucienne Capers M.D.   On: 03/18/2020 00:12     Assessment/Plan 70 year old male with history of HTN, asthma, morbid obesity history of PE on Coumadin, unvaccinated against Covid and diagnosed with Covid on 9/18  presenting with weakness and generalized malaise and family concerns for intermittent confusion at home.     Acute respiratory failure due to COVID-19 [facility])   Pneumonia due to COVID-19 virus -Diagnosed with COVID-19 on 9/18 -O2 sat 85% on room air with EMS requiring 6 L and patient with increased work of breathing unable to speak in full sentences -Remdesivir, methylprednisolone, albuterol, antitussives and multivitamins -Supplemental oxygen to keep sats over 92% -Proning as tolerated -Follow inflammatory biomarkers -Consider baricitinib if CRP elevated -Follow-up  procalcitonin to evaluate for secondary bacterial infection -Patient was treated for sepsis in ER based on fever, tachycardia and tachypnea but in the setting of Covid, criteria can be met by Covid infection by itself -Sepsis considered less likely but will continue to monitor for more overt signs of sepsis    Acute confusion -Reported by family but patient oriented x4 on arrival likely secondary to acute infection above -Neurochecks with fall and aspiration precautions    Asthma -Albuterol as needed    History of pulmonary saddle embolism Warfarin Long term (current) use with subtherapeutic INR -Therapeutic/full dose Lovenox given subtherapeutic INR of 1.4 -Pharmacy consult for Coumadin dosing    Essential hypertension -Continue home losartan    DVT prophylaxis: Full dose Code Status: full code  Family Communication:  none  Disposition Plan: Back to previous home environment Consults called: none  Status:At the time of admission, it appears that the appropriate admission status for this patient is INPATIENT. This is judged to be reasonable and necessary in order to provide the required intensity of service to ensure the patient's safety given the presenting symptoms, physical exam findings, and initial radiographic and laboratory data in the context of their  Comorbid conditions.   Patient requires inpatient status due to high intensity of service, high risk for further deterioration and high frequency of surveillance required.   I certify that at the point of admission it is my clinical judgment that the patient will require inpatient hospital care spanning beyond [place] MD [facility]     03/18/2020,  3:03 AM

## 2020-03-18 NOTE — Progress Notes (Signed)
ANTICOAGULATION CONSULT NOTE - Initial Consult  Pharmacy Consult for Lovenox/Warfarin  Indication: History of PE  Allergies  Allergen Reactions  . Cat Hair Extract Itching  . Diltiazem Anaphylaxis  . Sulfa Antibiotics Hives and Swelling  . Anoro Ellipta [Umeclidinium-Vilanterol]     Too much mucus  . Breo Ellipta [Fluticasone Furoate-Vilanterol]     Too much mucus  . Exalgo [Hydromorphone Hcl] Nausea Only    Sick and nauseated  . Fentanyl Other (See Comments)    REACTION: too sleepy  . Gabapentin   . Hydrocodone-Acetaminophen Nausea Only    REACTION: nausea  . Lopressor [Metoprolol Tartrate]     Asthmatic issues   . Penicillins Hives  . Sulfonamide Derivatives Hives and Swelling  . Tylenol [Acetaminophen]     Liver issues    Patient Measurements: Height: 5\' 11"  (180.3 cm) Weight: (!) 144.2 kg (318 lb) IBW/kg (Calculated) : 75.3  Vital Signs: Temp: 100.2 F (37.9 C) (09/26 0123) Temp Source: Oral (09/26 0123) BP: 106/82 (09/26 0329) Pulse Rate: 65 (09/26 0329)  Labs: Recent Labs    03/18/20 0011  HGB 16.4  HCT 48.7  PLT 179  APTT 44*  LABPROT 17.1*  INR 1.4*  CREATININE 1.21    Estimated Creatinine Clearance: 85 mL/min (by C-G formula based on SCr of 1.21 mg/dL).   Medical History: Past Medical History:  Diagnosis Date  . Anxiety   . Asthma    "no attack since acupuncture in 1990's"  . Complication of anesthesia    SLOW TO WAKE UP / DIFFICULTY RESPONDING 2003, 3 days before could use left arm, "wild/crazy sometimes"  . Cyst of left kidney    "benign"  . DVT (deep venous thrombosis) ([place]) 2003   right femoral artery "from groin to knee"  . Edema    Right Leg w/ h/o DVT postphlebitic  . H/O blood transfusion reaction 2004   FFP, extreme swelling, could not breath  . Headache(784.0)    MIGRAINES-not for a long time  . History of pulmonary embolism 2003   both lungs  . Hyperlipidemia   . Knee pain    left  . LBP (low back pain)   . 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 Obesity   . Osteoarthritis   . Peripheral vascular disease ([place])    "poor circulation in  RT leg"  . PONV (postoperative nausea and vomiting)    during surgery in 1990's  . Spondylolisthesis   . Warfarin anticoagulation   . Wheezing    when laying on left side    Assessment: 69 y/o M being admitted with acute respiratory failure in the setting of COVID-19 infection. On warfarin PTA for hx of PE. INR is low at 1.4. Covering with Lovenox for now while INR is low. CBC good. Renal function ok.   Goal of Therapy:  INR 2-3 Monitor platelets by anticoagulation protocol: Yes   Plan:  Lovenox 1 mg/kg subcutaneous q12h Warfarin 7.5 mg PO x 1 today at 1600 Daily PT/INR, CBC Monitor for bleeding DC Lovenox when INR is therapeutic   Narda Bonds, PharmD, [place] Pharmacist Phone: [phone]

## 2020-03-19 DIAGNOSIS — I10 Essential (primary) hypertension: Secondary | ICD-10-CM

## 2020-03-19 DIAGNOSIS — R7401 Elevation of levels of liver transaminase levels: Secondary | ICD-10-CM

## 2020-03-19 LAB — PROTIME-INR
INR: 1.4 — ABNORMAL HIGH (ref 0.8–1.2)
Prothrombin Time: 16.9 seconds — ABNORMAL HIGH (ref 11.4–15.2)

## 2020-03-19 LAB — CBC WITH DIFFERENTIAL/PLATELET
Abs Immature Granulocytes: 0.04 10*3/uL (ref 0.00–0.07)
Basophils Absolute: 0 10*3/uL (ref 0.0–0.1)
Basophils Relative: 0 %
Eosinophils Absolute: 0 10*3/uL (ref 0.0–0.5)
Eosinophils Relative: 0 %
HCT: 43.8 % (ref 39.0–52.0)
Hemoglobin: 15.4 g/dL (ref 13.0–17.0)
Immature Granulocytes: 1 %
Lymphocytes Relative: 16 %
Lymphs Abs: 1.1 10*3/uL (ref 0.7–4.0)
MCH: 33.2 pg (ref 26.0–34.0)
MCHC: 35.2 g/dL (ref 30.0–36.0)
MCV: 94.4 fL (ref 80.0–100.0)
Monocytes Absolute: 0.7 10*3/uL (ref 0.1–1.0)
Monocytes Relative: 10 %
Neutro Abs: 4.7 10*3/uL (ref 1.7–7.7)
Neutrophils Relative %: 73 %
Platelets: 212 10*3/uL (ref 150–400)
RBC: 4.64 MIL/uL (ref 4.22–5.81)
RDW: 12.2 % (ref 11.5–15.5)
WBC: 6.5 10*3/uL (ref 4.0–10.5)
nRBC: 0 % (ref 0.0–0.2)

## 2020-03-19 LAB — COMPREHENSIVE METABOLIC PANEL
ALT: 31 U/L (ref 0–44)
AST: 42 U/L — ABNORMAL HIGH (ref 15–41)
Albumin: 2.8 g/dL — ABNORMAL LOW (ref 3.5–5.0)
Alkaline Phosphatase: 34 U/L — ABNORMAL LOW (ref 38–126)
Anion gap: 10 (ref 5–15)
BUN: 19 mg/dL (ref 8–23)
CO2: 22 mmol/L (ref 22–32)
Calcium: 8.8 mg/dL — ABNORMAL LOW (ref 8.9–10.3)
Chloride: 100 mmol/L (ref 98–111)
Creatinine, Ser: 1.05 mg/dL (ref 0.61–1.24)
GFR calc Af Amer: >60 mL/min (ref >60)
GFR calc non Af Amer: >60 mL/min (ref >60)
Glucose, Bld: 160 mg/dL — ABNORMAL HIGH (ref 70–99)
Potassium: 3.8 mmol/L (ref 3.5–5.1)
Sodium: 132 mmol/L — ABNORMAL LOW (ref 135–145)
Total Bilirubin: 0.7 mg/dL (ref 0.3–1.2)
Total Protein: 6.6 g/dL (ref 6.5–8.1)

## 2020-03-19 LAB — FERRITIN: Ferritin: 1042 ng/mL — ABNORMAL HIGH (ref 24–336)

## 2020-03-19 LAB — URINE CULTURE

## 2020-03-19 LAB — C-REACTIVE PROTEIN: CRP: 4.8 mg/dL — ABNORMAL HIGH (ref <1.0)

## 2020-03-19 MED ORDER — WARFARIN SODIUM 7.5 MG PO TABS
7.5000 mg | ORAL_TABLET | Freq: Once | ORAL | Status: AC
  Administered 2020-03-19: 7.5 mg via ORAL
  Filled 2020-03-19: qty 1

## 2020-03-19 MED ORDER — METHYLPREDNISOLONE SODIUM SUCC 125 MG IJ SOLR
60.0000 mg | Freq: Three times a day (TID) | INTRAMUSCULAR | Status: DC
  Administered 2020-03-19 – 2020-03-21 (×7): 60 mg via INTRAVENOUS
  Filled 2020-03-19 (×7): qty 2

## 2020-03-19 MED ORDER — PANTOPRAZOLE SODIUM 40 MG PO TBEC
40.0000 mg | DELAYED_RELEASE_TABLET | Freq: Every day | ORAL | Status: DC
  Administered 2020-03-19 – 2020-03-22 (×4): 40 mg via ORAL
  Filled 2020-03-19 (×4): qty 1

## 2020-03-19 MED ORDER — MELATONIN 5 MG PO TABS
5.0000 mg | ORAL_TABLET | Freq: Every day | ORAL | Status: DC
  Administered 2020-03-19 – 2020-03-20 (×2): 5 mg via ORAL
  Filled 2020-03-19 (×4): qty 1

## 2020-03-19 NOTE — Progress Notes (Signed)
PROGRESS NOTE                                                                                                                                                                                                             Patient Demographics:    Robert [name], is a 69 y.o. male, DOB - 1951/05/30, DJS:[id number]  Admit date - 03/17/2020   Admitting Physician Athena Masse, MD  Outpatient Primary MD for the patient is [name], [name], MD  LOS - 1   Chief Complaint  Patient presents with  . COVID-19  . Fatigue  . Shortness of Breath       Brief Narrative    69 y.o. male with medical history significant for HTN, asthma, morbid obesity history of PE on Coumadin, diagnosed with Covid on 9/18 who was brought to the emergency room with increasing shortness of breath, weakness and generalized malaise with family reporting intermittent confusion at home.  Patient is unvaccinated.  He endorses fever.  He denies chest pain.  On arrival of EMS O2 sat was 85% on room air requiring 6 L to maintain sats in the mid 90s.   Subjective:    [name] today for generalized weakness, fatigue, shortness of breath and cough .   Assessment  & Plan :    Principal Problem:   Acute respiratory failure due to COVID-19 [facility]) Active Problems:   Obesity, Class III, BMI 40-49.9 (morbid obesity) ([place])   Asthma   History of pulmonary embolism   Long term (current) use of anticoagulants   Essential hypertension   Pneumonia due to COVID-19 virus   Acute hypoxic respiratory failure due to COVID-19 of pneumonia. - O2 sat 85% on room air with EMS requiring 6 L, for multifocal opacities. -Continue with IV steroids, changed to Solu-Medrol. -Continue with IV remdesivir. -Continue with baricitinib. -Procalcitonin 0.12, no indication for antibiotics. -He was encouraged with incentive spirometry, flutter valve, out of bed to chair -Sepsis ruled out.    Acute  confusion -Resolved, mentation back to baseline    Asthma -Albuterol as needed, no wheezing   History of pulmonary saddle embolism Warfarin Long term (current) use with subtherapeutic INR -With warfarin, pharmacy to dose, subtherapeutic, bridged with Lovenox.    Essential hypertension -Continue home losartan  MAT/history of SVT -Continue with telemetry monitoring, blood pressure is  soft, have stopped losartan, he was recommended to start calcium channel blockers in the past by cardiology, heart rate is up we will start on calcium channel blockers.  Elevated AST -Due to Covid, continue to monitor     COVID-19 Labs  Recent Labs    03/18/20 0412 03/19/20 0500  DDIMER 0.42  --   FERRITIN 1,024* 1,042*  CRP 7.1* 4.8*    Lab Results  Component Value Date   SARSCOV2NAA POSITIVE (A) 03/18/2020   No charge note  Lab Results  Component Value Date   PLT 212 03/19/2020    Code Status :Full  Family Communication  : Discussed with wife by phone  Disposition Plan  :  Status is: Inpatient  Remains inpatient appropriate because:Hemodynamically unstable   Dispo: The patient is from: Home              Anticipated d/c is to: Home              Anticipated d/c date is: > 3 days              Patient currently is not medically stable to d/c.        Barriers For Discharge :   Consults  :  none  Procedures  : none  DVT Prophylaxis  :  On warfarin /lovenox     Antibiotics  :    Anti-infectives (From admission, onward)   Start     Dose/Rate Route Frequency Ordered Stop   03/19/20 1000  remdesivir 100 mg in sodium chloride 0.9 % 100 mL IVPB       "Followed by" Linked Group Details   100 mg 200 mL/hr over 30 Minutes Intravenous Daily 03/18/20 0303 03/23/20 0959   03/18/20 2200  cefTRIAXone (ROCEPHIN) 1 g in sodium chloride 0.9 % 100 mL IVPB  Status:  Discontinued        1 g 200 mL/hr over 30 Minutes Intravenous Every 24 hours 03/18/20 0013 03/18/20 1508    03/18/20 2200  azithromycin (ZITHROMAX) 500 mg in sodium chloride 0.9 % 250 mL IVPB  Status:  Discontinued        500 mg 250 mL/hr over 60 Minutes Intravenous Every 24 hours 03/18/20 0013 03/18/20 1508   03/18/20 0345  remdesivir 200 mg in sodium chloride 0.9% 250 mL IVPB       "Followed by" Linked Group Details   200 mg 580 mL/hr over 30 Minutes Intravenous Once 03/18/20 0303 03/18/20 0502   03/17/20 2330  levofloxacin (LEVAQUIN) IVPB 750 mg        750 mg 100 mL/hr over 90 Minutes Intravenous  Once 03/17/20 2329 03/18/20 0143        Objective:   Vitals:   03/19/20 0147 03/19/20 0430 03/19/20 0720 03/19/20 1150  BP: 112/71 (!) 98/59 119/68 (!) 107/58  Pulse: (!) 59 64 83 95  Resp: 17 20 18 20   Temp: 98.4 F (36.9 C) 98.6 F (37 C) 97.8 F (36.6 C) 98.1 F (36.7 C)  TempSrc: Oral Oral Oral Oral  SpO2: 95% 93% 92% 93%  Weight:      Height:        Wt Readings from Last 3 Encounters:  03/18/20 (!) 147.3 kg  02/01/20 (!) 149.2 kg  12/21/19 (!) 146.1 kg     Intake/Output Summary (Last 24 hours) at 03/19/2020 1426 Last data filed at 03/19/2020 0900 Gross per 24 hour  Intake 2301.33 ml  Output 1105 ml  Net 1196.33 ml  Physical Exam  Awake Alert, Oriented X 3, No new F.N deficits, Normal affect Symmetrical Chest wall movement, Good air movement bilaterally, CTAB RRR,No Gallops,Rubs or new Murmurs, No Parasternal Heave +ve B.Sounds, Abd Soft, No tenderness, No rebound - guarding or rigidity. No Cyanosis, Clubbing or edema, No new Rash or bruise       Data Review:    CBC Recent Labs  Lab 03/18/20 0011 03/19/20 0500  WBC 4.6 6.5  HGB 16.4 15.4  HCT 48.7 43.8  PLT 179 212  MCV 95.5 94.4  MCH 32.2 33.2  MCHC 33.7 35.2  RDW 12.5 12.2  LYMPHSABS 0.7 1.1  MONOABS 0.5 0.7  EOSABS 0.0 0.0  BASOSABS 0.0 0.0    Chemistries  Recent Labs  Lab 03/18/20 0011 03/19/20 0500  NA 131* 132*  K 4.7 3.8  CL 92* 100  CO2 26 22  GLUCOSE 137* 160*  BUN 15 19    CREATININE 1.21 1.05  CALCIUM 8.7* 8.8*  AST 53* 42*  ALT 36 31  ALKPHOS 45 34*  BILITOT 1.0 0.7   ------------------------------------------------------------------------------------------------------------------ No results for input(s): CHOL, HDL, LDLCALC, TRIG, CHOLHDL, LDLDIRECT in the last 72 hours.  Lab Results  Component Value Date   HGBA1C 6.0 12/21/2019   ------------------------------------------------------------------------------------------------------------------ No results for input(s): TSH, T4TOTAL, T3FREE, THYROIDAB in the last 72 hours.  Invalid input(s): FREET3 ------------------------------------------------------------------------------------------------------------------ Recent Labs    03/18/20 0412 03/19/20 0500  FERRITIN 1,024* 1,042*    Coagulation profile Recent Labs  Lab 03/18/20 0011 03/19/20 0500  INR 1.4* 1.4*    Recent Labs    03/18/20 0412  DDIMER 0.42    Cardiac Enzymes No results for input(s): CKMB, TROPONINI, MYOGLOBIN in the last 168 hours.  Invalid input(s): CK ------------------------------------------------------------------------------------------------------------------ No results found for: BNP  Inpatient Medications  Scheduled Meds: . albuterol  2 puff Inhalation Q6H WA  . vitamin C  500 mg Oral Daily  . baricitinib  4 mg Oral Daily  . enoxaparin (LOVENOX) injection  150 mg Subcutaneous Q12H  . furosemide  20 mg Oral Daily  . influenza vaccine adjuvanted  0.5 mL Intramuscular Tomorrow-1000  . methylPREDNISolone (SOLU-MEDROL) injection  60 mg Intravenous Q8H  . multivitamin with minerals  1 tablet Oral Daily  . nortriptyline  10 mg Oral QPM  . omega-3 acid ethyl esters  1 g Oral Daily  . pantoprazole  40 mg Oral Daily  . warfarin  7.5 mg Oral ONCE-1600  . Warfarin - Pharmacist Dosing Inpatient   Does not apply q1600  . zinc sulfate  220 mg Oral Daily   Continuous Infusions: . remdesivir 100 mg in NS 100 mL  100 mg (03/19/20 1053)   PRN Meds:.acetaminophen, fluticasone, guaiFENesin-dextromethorphan, ondansetron **OR** ondansetron (ZOFRAN) IV, senna-docusate  Micro Results Recent Results (from the past 240 hour(s))  Blood Culture (routine x 2)     Status: None (Preliminary result)   Collection Time: 03/18/20 12:11 AM   Specimen: BLOOD  Result Value Ref Range Status   Specimen Description BLOOD RIGHT ARM  Final   Special Requests   Final    BOTTLES DRAWN AEROBIC AND ANAEROBIC Blood Culture adequate volume   Culture   Final    NO GROWTH 1 DAY Performed at [facility], [place] [address] [place], [place] [address]    Report Status PENDING  Incomplete  Blood Culture (routine x 2)     Status: None (Preliminary result)   Collection Time: 03/18/20 12:11 AM   Specimen: BLOOD  Result  Value Ref Range Status   Specimen Description BLOOD LEFT HAND  Final   Special Requests   Final    BOTTLES DRAWN AEROBIC AND ANAEROBIC Blood Culture results may not be optimal due to an inadequate volume of blood received in culture bottles   Culture   Final    NO GROWTH 1 DAY Performed at [facility] [address] [place], [place] [address]    Report Status PENDING  Incomplete  Respiratory Panel by RT PCR (Flu A&B, Covid) - Nasopharyngeal Swab     Status: Abnormal   Collection Time: 03/18/20 12:11 AM   Specimen: Nasopharyngeal Swab  Result Value Ref Range Status   SARS Coronavirus 2 by RT PCR POSITIVE (A) NEGATIVE Final    Comment: RESULT CALLED TO, READ BACK BY AND VERIFIED WITH: L. VENEGAS,RN 0146 03/18/2020 T. TYSOR (NOTE) SARS-CoV-2 target nucleic acids are DETECTED.  SARS-CoV-2 RNA is generally detectable in upper respiratory specimens  during the acute phase of infection. Positive results are indicative of the presence of the identified virus, but do not rule out bacterial infection or co-infection with other pathogens not detected by the test. Clinical correlation with patient history  and other diagnostic information is necessary to determine patient infection status. The expected result is Negative.  Fact Sheet for Patients:  PinkCheek.be  Fact Sheet for Healthcare Providers: GravelBags.it  This test is not yet approved or cleared by the [place] FDA and  has been authorized for detection and/or diagnosis of SARS-CoV-2 by FDA under an Emergency Use Authorization (EUA).  This EUA will remain in effect (meaning this test can  be used) for the duration of  the COVID-19 declaration under Section 564(b)(1) of the Act, 21 U.S.C. section 360bbb-3(b)(1), unless the authorization is terminated or revoked sooner.      Influenza A by PCR NEGATIVE NEGATIVE Final   Influenza B by PCR NEGATIVE NEGATIVE Final    Comment: (NOTE) The Xpert Xpress SARS-CoV-2/FLU/RSV assay is intended as an aid in  the diagnosis of influenza from Nasopharyngeal swab specimens and  should not be used as a sole basis for treatment. Nasal washings and  aspirates are unacceptable for Xpert Xpress SARS-CoV-2/FLU/RSV  testing.  Fact Sheet for Patients: PinkCheek.be  Fact Sheet for Healthcare Providers: GravelBags.it  This test is not yet approved or cleared by the [place] FDA and  has been authorized for detection and/or diagnosis of SARS-CoV-2 by  FDA under an Emergency Use Authorization (EUA). This EUA will remain  in effect (meaning this test can be used) for the duration of the  Covid-19 declaration under Section 564(b)(1) of the Act, 21  U.S.C. section 360bbb-3(b)(1), unless the authorization is  terminated or revoked. Performed at [facility], [place] [address], [place], [place] [address]   Urine culture     Status: Abnormal   Collection Time: 03/18/20  2:32 AM   Specimen: In/Out Cath Urine  Result Value Ref Range Status   Specimen Description IN/OUT CATH  URINE  Final   Special Requests   Final    NONE Performed at [facility], [place] [address], [place], [place] [address]    Culture MULTIPLE SPECIES PRESENT, SUGGEST RECOLLECTION (A)  Final   Report Status 03/19/2020 FINAL  Final    Radiology Reports DG Chest Port 1 View  Result Date: 03/18/2020 CLINICAL DATA:  Worsening shortness of breath.  COVID positive. EXAM: PORTABLE CHEST 1 VIEW COMPARISON:  03/31/2013 FINDINGS: Cardiac enlargement. Patchy peripheral  and basilar infiltration with peribronchial thickening compatible with multifocal pneumonia. No pleural effusions. No pneumothorax. Mediastinal contours appear intact. IMPRESSION: Cardiac enlargement. Patchy bilateral pulmonary infiltrates compatible with multifocal pneumonia. Electronically Signed   By: Lucienne Capers M.D.   On: 03/18/2020 00:12      Phillips Climes M.D on 03/19/2020 at 2:26 PM    [facility] -  Office  [phone]

## 2020-03-19 NOTE — Progress Notes (Signed)
ANTICOAGULATION CONSULT NOTE -Followu up   Pharmacy Consult for Lovenox/Warfarin  Indication: History of PE  Allergies  Allergen Reactions  . Cat Hair Extract Itching  . Diltiazem Anaphylaxis  . Sulfa Antibiotics Hives and Swelling  . Anoro Ellipta [Umeclidinium-Vilanterol]     Too much mucus  . Breo Ellipta [Fluticasone Furoate-Vilanterol]     Too much mucus  . Exalgo [Hydromorphone Hcl] Nausea Only    Sick and nauseated  . Fentanyl Other (See Comments)    REACTION: too sleepy  . Gabapentin   . Hydrocodone-Acetaminophen Nausea Only    REACTION: nausea  . Lopressor [Metoprolol Tartrate]     Asthmatic issues   . Penicillins Hives  . Sulfonamide Derivatives Hives and Swelling  . Tylenol [Acetaminophen]     Liver issues    Patient Measurements: Height: 5\' 11"  (180.3 cm) Weight: (!) 147.3 kg (324 lb 11.8 oz) IBW/kg (Calculated) : 75.3  Vital Signs: Temp: 97.8 F (36.6 C) (09/27 0720) Temp Source: Oral (09/27 0720) BP: 119/68 (09/27 0720) Pulse Rate: 83 (09/27 0720)  Labs: Recent Labs    03/18/20 0011 03/19/20 0500  HGB 16.4 15.4  HCT 48.7 43.8  PLT 179 212  APTT 44*  --   LABPROT 17.1* 16.9*  INR 1.4* 1.4*  CREATININE 1.21 1.05    Estimated Creatinine Clearance: 99.1 mL/min (by C-G formula based on SCr of 1.05 mg/dL).   Medical History: Past Medical History:  Diagnosis Date  . Anxiety   . Asthma    "no attack since acupuncture in 1990's"  . Complication of anesthesia    SLOW TO WAKE UP / DIFFICULTY RESPONDING 2003, 3 days before could use left arm, "wild/crazy sometimes"  . Cyst of left kidney    "benign"  . DVT (deep venous thrombosis) ([place]) 2003   right femoral artery "from groin to knee"  . Edema    Right Leg w/ h/o DVT postphlebitic  . H/O blood transfusion reaction 2004   FFP, extreme swelling, could not breath  . Headache(784.0)    MIGRAINES-not for a long time  . History of pulmonary embolism 2003   both lungs  . Hyperlipidemia   .  Knee pain    left  . LBP (low back pain)   . 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 Obesity   . Osteoarthritis   . Peripheral vascular disease ([place])    "poor circulation in  RT leg"  . PONV (postoperative nausea and vomiting)    during surgery in 1990's  . Spondylolisthesis   . Warfarin anticoagulation   . Wheezing    when laying on left side    Assessment: 69 y/o M being admitted with acute respiratory failure in the setting of COVID-19 infection. On warfarin PTA for hx of PE.  Covering with Lovenox for now while INR is low.  INR is 1.4 again today. CBC good. Renal function ok.  No bleeding reported  Goal of Therapy:  INR 2-3 Monitor platelets by anticoagulation protocol: Yes   Plan:  Continue Lovenox 1 mg/kg subcutaneous q12h Warfarin 7.5 mg PO x 1 today at 1600 Daily PT/INR, CBC Monitor for bleeding DC Lovenox when INR is therapeutic    Nicole Cella, RPh Clinical Pharmacist Phone: [phone] Please check AMION for all [place] phone numbers After 10:00 PM, call [place] [phone] 03/19/2020 9:27 AM

## 2020-03-19 NOTE — Progress Notes (Signed)
   03/19/20 0920  Clinical Encounter Type  Visited With Other (Comment) (Called patient's room)  Visit Type Spiritual support  Referral From Nurse  Consult/Referral To Chaplain  Consult Request for Chaplain support.Chaplain called patient's room, to offer prayer. No answer. Patient's name added to [place] list. This note was prepared by Jeanine Luz, M.Div..  For questions please contact by phone [phone].

## 2020-03-19 NOTE — Plan of Care (Signed)
  Problem: Coping: Goal: Psychosocial and spiritual needs will be supported Outcome: Progressing   Problem: Education: Goal: Knowledge of risk factors and measures for prevention of condition will improve Outcome: Progressing   Problem: Respiratory: Goal: Will maintain a patent airway Outcome: Progressing   Problem: Respiratory: Goal: Complications related to the disease process, condition or treatment will be avoided or minimized Outcome: Progressing

## 2020-03-20 DIAGNOSIS — Z86711 Personal history of pulmonary embolism: Secondary | ICD-10-CM

## 2020-03-20 LAB — COMPREHENSIVE METABOLIC PANEL
ALT: 48 U/L — ABNORMAL HIGH (ref 0–44)
AST: 61 U/L — ABNORMAL HIGH (ref 15–41)
Albumin: 2.8 g/dL — ABNORMAL LOW (ref 3.5–5.0)
Alkaline Phosphatase: 37 U/L — ABNORMAL LOW (ref 38–126)
Anion gap: 13 (ref 5–15)
BUN: 23 mg/dL (ref 8–23)
CO2: 22 mmol/L (ref 22–32)
Calcium: 8.8 mg/dL — ABNORMAL LOW (ref 8.9–10.3)
Chloride: 98 mmol/L (ref 98–111)
Creatinine, Ser: 0.94 mg/dL (ref 0.61–1.24)
GFR calc Af Amer: >60 mL/min (ref >60)
GFR calc non Af Amer: >60 mL/min (ref >60)
Glucose, Bld: 147 mg/dL — ABNORMAL HIGH (ref 70–99)
Potassium: 4.1 mmol/L (ref 3.5–5.1)
Sodium: 133 mmol/L — ABNORMAL LOW (ref 135–145)
Total Bilirubin: 1.1 mg/dL (ref 0.3–1.2)
Total Protein: 6.7 g/dL (ref 6.5–8.1)

## 2020-03-20 LAB — C-REACTIVE PROTEIN: CRP: 1.7 mg/dL — ABNORMAL HIGH

## 2020-03-20 LAB — CBC WITH DIFFERENTIAL/PLATELET
Abs Immature Granulocytes: 0.1 K/uL — ABNORMAL HIGH (ref 0.00–0.07)
Basophils Absolute: 0 K/uL (ref 0.0–0.1)
Basophils Relative: 0 %
Eosinophils Absolute: 0 K/uL (ref 0.0–0.5)
Eosinophils Relative: 0 %
HCT: 44.9 % (ref 39.0–52.0)
Hemoglobin: 15.6 g/dL (ref 13.0–17.0)
Immature Granulocytes: 1 %
Lymphocytes Relative: 12 %
Lymphs Abs: 1.3 K/uL (ref 0.7–4.0)
MCH: 32.6 pg (ref 26.0–34.0)
MCHC: 34.7 g/dL (ref 30.0–36.0)
MCV: 93.9 fL (ref 80.0–100.0)
Monocytes Absolute: 0.8 K/uL (ref 0.1–1.0)
Monocytes Relative: 7 %
Neutro Abs: 9.3 K/uL — ABNORMAL HIGH (ref 1.7–7.7)
Neutrophils Relative %: 80 %
Platelets: 247 K/uL (ref 150–400)
RBC: 4.78 MIL/uL (ref 4.22–5.81)
RDW: 12.3 % (ref 11.5–15.5)
WBC: 11.6 K/uL — ABNORMAL HIGH (ref 4.0–10.5)
nRBC: 0 % (ref 0.0–0.2)

## 2020-03-20 LAB — FERRITIN: Ferritin: 1014 ng/mL — ABNORMAL HIGH (ref 24–336)

## 2020-03-20 LAB — PROTIME-INR
INR: 2 — ABNORMAL HIGH (ref 0.8–1.2)
Prothrombin Time: 22 s — ABNORMAL HIGH (ref 11.4–15.2)

## 2020-03-20 MED ORDER — FUROSEMIDE 10 MG/ML IJ SOLN
20.0000 mg | Freq: Once | INTRAMUSCULAR | Status: AC
  Administered 2020-03-20: 20 mg via INTRAVENOUS
  Filled 2020-03-20: qty 2

## 2020-03-20 MED ORDER — WARFARIN SODIUM 6 MG PO TABS
6.0000 mg | ORAL_TABLET | Freq: Once | ORAL | Status: AC
  Administered 2020-03-20: 6 mg via ORAL
  Filled 2020-03-20: qty 1

## 2020-03-20 NOTE — Plan of Care (Signed)
  Problem: Education: Goal: Knowledge of risk factors and measures for prevention of condition will improve Outcome: Progressing   Problem: Respiratory: Goal: Will maintain a patent airway Outcome: Progressing   Problem: Clinical Measurements: Goal: Will remain free from infection Outcome: Progressing   Problem: Activity: Goal: Risk for activity intolerance will decrease Outcome: Progressing   Problem: Safety: Goal: Ability to remain free from injury will improve Outcome: Progressing

## 2020-03-20 NOTE — Progress Notes (Signed)
SATURATION QUALIFICATIONS: (This note is used to comply with regulatory documentation for home oxygen)  Patient Saturations on Room Air at Rest = 92%  Patient Saturations on Room Air while Ambulating = 91%  Patient Saturations on 0 Liters of oxygen while Ambulating = 91%  Please briefly explain why patient needs home oxygen: Pt not requiring O2 at this time. Pt however, experiencing SOB and tachypnea with activity. Placed on 2L [place] for comfort.

## 2020-03-20 NOTE — Progress Notes (Addendum)
PROGRESS NOTE                                                                                                                                                                                                             Patient Demographics:    Robert [name], is a 69 y.o. male, DOB - [birth year], LZJ:[id number]  Admit date - 03/17/2020   Admitting Physician Athena Masse, MD  Outpatient Primary MD for the patient is [name], [name], MD  LOS - 2   Chief Complaint  Patient presents with  . COVID-19  . Fatigue  . Shortness of Breath       Brief Narrative    69 y.o. male with medical history significant for HTN, asthma, morbid obesity history of PE on Coumadin, diagnosed with Covid on 9/18 who was brought to the emergency room with increasing shortness of breath, weakness and generalized malaise with family reporting intermittent confusion at home.  Patient is unvaccinated.  He endorses fever.  He denies chest pain.  On arrival of EMS O2 sat was 85% on room air requiring 6 L to maintain sats in the mid 90s.   Subjective:    [name] today ports poor night sleep due to coughing, report dyspnea has improved, denies any chest pain .   Assessment  & Plan :    Principal Problem:   Acute respiratory failure due to COVID-19 [facility]) Active Problems:   Obesity, Class III, BMI 40-49.9 (morbid obesity) (HCC)   Asthma   History of pulmonary embolism   Long term (current) use of anticoagulants   Essential hypertension   Pneumonia due to COVID-19 virus   Acute hypoxic respiratory failure due to COVID-19 of pneumonia. - O2 sat 85% on room air with EMS requiring 6 L, for multifocal opacities, this has significantly improved, this morning he is on 2 L nasal cannula -Continue with IV steroids, changed to Solu-Medrol. -Continue with IV remdesivir. -Continue with baricitinib. -Procalcitonin 0.12, no indication for antibiotics. -He was encouraged  with incentive spirometry, flutter valve, out of bed to chair -Continue with home dose Lasix 20 mg oral daily, will give an extra dose of 20 mg IV Lasix once today given some trace edema. -Sepsis ruled out.  SpO2: 91 % O2 Flow Rate (L/min): 6 L/min     Acute confusion -Resolved, mentation  back to baseline    Asthma -Albuterol as needed, no wheezing   History of pulmonary saddle embolism Warfarin Long term (current) use with subtherapeutic INR -With warfarin, pharmacy to dose, subtherapeutic, bridged with Lovenox.    Essential hypertension -Blood pressure is soft, losartan has been stopped -If blood pressure started to increase, then will start on Cardizem (for history of arrhythmias), please see discussion below.  MAT/history of SVT -Continue with telemetry monitoring, blood pressure is soft, have stopped losartan, he was recommended to start calcium channel blockers in the past by cardiology, heart rate is up we will start on calcium channel blockers.  Elevated AST -Due to Covid, continue to monitor  Steroid-dependent -Patient reports he is on 5 mg of prednisone for last few months by PCP secondary to chronic pain.  Likely need to be resumed after stopping his IV steroids, and to be weaned off by his PCP in outpatient setting.     COVID-19 Labs  Recent Labs    03/18/20 0412 03/19/20 0500 03/20/20 0615  DDIMER 0.42  --   --   FERRITIN 1,024* 1,042* 1,014*  CRP 7.1* 4.8* 1.7*    Lab Results  Component Value Date   SARSCOV2NAA POSITIVE (A) 03/18/2020   No charge note  Lab Results  Component Value Date   PLT 247 03/20/2020    Code Status :Full  Family Communication  : Discussed with wife by phone daily, left voicemail 9/28.  Disposition Plan  :  Status is: Inpatient  Remains inpatient appropriate because:Hemodynamically unstable   Dispo: The patient is from: Home              Anticipated d/c is to: Home              Anticipated d/c date is: > 1  days              Patient currently is not medically stable to d/c.        Barriers For Discharge :   Consults  :  none  Procedures  : none  DVT Prophylaxis  :  On warfarin /lovenox     Antibiotics  :    Anti-infectives (From admission, onward)   Start     Dose/Rate Route Frequency Ordered Stop   03/19/20 1000  remdesivir 100 mg in sodium chloride 0.9 % 100 mL IVPB       "Followed by" Linked Group Details   100 mg 200 mL/hr over 30 Minutes Intravenous Daily 03/18/20 0303 03/23/20 0959   03/18/20 2200  cefTRIAXone (ROCEPHIN) 1 g in sodium chloride 0.9 % 100 mL IVPB  Status:  Discontinued        1 g 200 mL/hr over 30 Minutes Intravenous Every 24 hours 03/18/20 0013 03/18/20 1508   03/18/20 2200  azithromycin (ZITHROMAX) 500 mg in sodium chloride 0.9 % 250 mL IVPB  Status:  Discontinued        500 mg 250 mL/hr over 60 Minutes Intravenous Every 24 hours 03/18/20 0013 03/18/20 1508   03/18/20 0345  remdesivir 200 mg in sodium chloride 0.9% 250 mL IVPB       "Followed by" Linked Group Details   200 mg 580 mL/hr over 30 Minutes Intravenous Once 03/18/20 0303 03/18/20 0502   03/17/20 2330  levofloxacin (LEVAQUIN) IVPB 750 mg        750 mg 100 mL/hr over 90 Minutes Intravenous  Once 03/17/20 2329 03/18/20 0143        Objective:   Vitals:  03/19/20 1601 03/19/20 1957 03/19/20 2000 03/20/20 0428  BP: 105/68 (!) 109/57  (!) 107/59  Pulse: 93 87  92  Resp: 19 20  20   Temp: 98 F (36.7 C) 98.1 F (36.7 C)  98.1 F (36.7 C)  TempSrc: Oral Oral  Oral  SpO2: 91% 92% 91% 91%  Weight:      Height:        Wt Readings from Last 3 Encounters:  03/18/20 (!) 147.3 kg  02/01/20 (!) 149.2 kg  12/21/19 (!) 146.1 kg     Intake/Output Summary (Last 24 hours) at 03/20/2020 1223 Last data filed at 03/20/2020 0838 Gross per 24 hour  Intake 1650 ml  Output 950 ml  Net 700 ml     Physical Exam  Awake Alert, Oriented X 3, No new F.N deficits, Normal affect Symmetrical Chest  wall movement, Good air movement bilaterally, CTAB RRR,No Gallops,Rubs or new Murmurs, No Parasternal Heave +ve B.Sounds, Abd Soft, No tenderness, No rebound - guarding or rigidity. No Cyanosis, Clubbing , has trace edema, No new Rash or bruise       Data Review:    CBC Recent Labs  Lab 03/18/20 0011 03/19/20 0500 03/20/20 0615  WBC 4.6 6.5 11.6*  HGB 16.4 15.4 15.6  HCT 48.7 43.8 44.9  PLT 179 212 247  MCV 95.5 94.4 93.9  MCH 32.2 33.2 32.6  MCHC 33.7 35.2 34.7  RDW 12.5 12.2 12.3  LYMPHSABS 0.7 1.1 1.3  MONOABS 0.5 0.7 0.8  EOSABS 0.0 0.0 0.0  BASOSABS 0.0 0.0 0.0    Chemistries  Recent Labs  Lab 03/18/20 0011 03/19/20 0500 03/20/20 0615  NA 131* 132* 133*  K 4.7 3.8 4.1  CL 92* 100 98  CO2 26 22 22   GLUCOSE 137* 160* 147*  BUN 15 19 23   CREATININE 1.21 1.05 0.94  CALCIUM 8.7* 8.8* 8.8*  AST 53* 42* 61*  ALT 36 31 48*  ALKPHOS 45 34* 37*  BILITOT 1.0 0.7 1.1   ------------------------------------------------------------------------------------------------------------------ No results for input(s): CHOL, HDL, LDLCALC, TRIG, CHOLHDL, LDLDIRECT in the last 72 hours.  Lab Results  Component Value Date   HGBA1C 6.0 12/21/2019   ------------------------------------------------------------------------------------------------------------------ No results for input(s): TSH, T4TOTAL, T3FREE, THYROIDAB in the last 72 hours.  Invalid input(s): FREET3 ------------------------------------------------------------------------------------------------------------------ Recent Labs    03/19/20 0500 03/20/20 0615  FERRITIN 1,042* 1,014*    Coagulation profile Recent Labs  Lab 03/18/20 0011 03/19/20 0500 03/20/20 0615  INR 1.4* 1.4* 2.0*    Recent Labs    03/18/20 0412  DDIMER 0.42    Cardiac Enzymes No results for input(s): CKMB, TROPONINI, MYOGLOBIN in the last 168 hours.  Invalid input(s):  CK ------------------------------------------------------------------------------------------------------------------ No results found for: BNP  Inpatient Medications  Scheduled Meds: . albuterol  2 puff Inhalation Q6H WA  . vitamin C  500 mg Oral Daily  . baricitinib  4 mg Oral Daily  . furosemide  20 mg Oral Daily  . influenza vaccine adjuvanted  0.5 mL Intramuscular Tomorrow-1000  . melatonin  5 mg Oral QHS  . methylPREDNISolone (SOLU-MEDROL) injection  60 mg Intravenous Q8H  . multivitamin with minerals  1 tablet Oral Daily  . nortriptyline  10 mg Oral QPM  . omega-3 acid ethyl esters  1 g Oral Daily  . pantoprazole  40 mg Oral Daily  . warfarin  6 mg Oral ONCE-1600  . Warfarin - Pharmacist Dosing Inpatient   Does not apply q1600  . zinc sulfate  220 mg Oral  Daily   Continuous Infusions: . remdesivir 100 mg in NS 100 mL 100 mg (03/20/20 0844)   PRN Meds:.acetaminophen, fluticasone, guaiFENesin-dextromethorphan, ondansetron **OR** ondansetron (ZOFRAN) IV, senna-docusate  Micro Results Recent Results (from the past 240 hour(s))  Blood Culture (routine x 2)     Status: None (Preliminary result)   Collection Time: 03/18/20 12:11 AM   Specimen: BLOOD  Result Value Ref Range Status   Specimen Description BLOOD RIGHT ARM  Final   Special Requests   Final    BOTTLES DRAWN AEROBIC AND ANAEROBIC Blood Culture adequate volume   Culture   Final    NO GROWTH 2 DAYS Performed at [facility], [place] [address] [place], [place] [address]    Report Status PENDING  Incomplete  Blood Culture (routine x 2)     Status: None (Preliminary result)   Collection Time: 03/18/20 12:11 AM   Specimen: BLOOD  Result Value Ref Range Status   Specimen Description BLOOD LEFT HAND  Final   Special Requests   Final    BOTTLES DRAWN AEROBIC AND ANAEROBIC Blood Culture results may not be optimal due to an inadequate volume of blood received in culture bottles   Culture   Final    NO GROWTH 2  DAYS Performed at [facility], [place] [address], [place], [place] [address]    Report Status PENDING  Incomplete  Respiratory Panel by RT PCR (Flu A&B, Covid) - Nasopharyngeal Swab     Status: Abnormal   Collection Time: 03/18/20 12:11 AM   Specimen: Nasopharyngeal Swab  Result Value Ref Range Status   SARS Coronavirus 2 by RT PCR POSITIVE (A) NEGATIVE Final    Comment: RESULT CALLED TO, READ BACK BY AND VERIFIED WITH: L. VENEGAS,RN 0146 03/18/2020 T. TYSOR (NOTE) SARS-CoV-2 target nucleic acids are DETECTED.  SARS-CoV-2 RNA is generally detectable in upper respiratory specimens  during the acute phase of infection. Positive results are indicative of the presence of the identified virus, but do not rule out bacterial infection or co-infection with other pathogens not detected by the test. Clinical correlation with patient history and other diagnostic information is necessary to determine patient infection status. The expected result is Negative.  Fact Sheet for Patients:  PinkCheek.be  Fact Sheet for Healthcare Providers: GravelBags.it  This test is not yet approved or cleared by the [place] FDA and  has been authorized for detection and/or diagnosis of SARS-CoV-2 by FDA under an Emergency Use Authorization (EUA).  This EUA will remain in effect (meaning this test can  be used) for the duration of  the COVID-19 declaration under Section 564(b)(1) of the Act, 21 U.S.C. section 360bbb-3(b)(1), unless the authorization is terminated or revoked sooner.      Influenza A by PCR NEGATIVE NEGATIVE Final   Influenza B by PCR NEGATIVE NEGATIVE Final    Comment: (NOTE) The Xpert Xpress SARS-CoV-2/FLU/RSV assay is intended as an aid in  the diagnosis of influenza from Nasopharyngeal swab specimens and  should not be used as a sole basis for treatment. Nasal washings and  aspirates are unacceptable for Xpert Xpress  SARS-CoV-2/FLU/RSV  testing.  Fact Sheet for Patients: PinkCheek.be  Fact Sheet for Healthcare Providers: GravelBags.it  This test is not yet approved or cleared by the [place] FDA and  has been authorized for detection and/or diagnosis of SARS-CoV-2 by  FDA under an Emergency Use Authorization (EUA). This EUA will remain  in effect (meaning this test can be used) for  the duration of the  Covid-19 declaration under Section 564(b)(1) of the Act, 21  U.S.C. section 360bbb-3(b)(1), unless the authorization is  terminated or revoked. Performed at [facility], [place] [address], [place], [place] [address]   Urine culture     Status: Abnormal   Collection Time: 03/18/20  2:32 AM   Specimen: In/Out Cath Urine  Result Value Ref Range Status   Specimen Description IN/OUT CATH URINE  Final   Special Requests   Final    NONE Performed at [facility], [place] [address], [place], [place] [address]    Culture MULTIPLE SPECIES PRESENT, SUGGEST RECOLLECTION (A)  Final   Report Status 03/19/2020 FINAL  Final    Radiology Reports DG Chest Port 1 View  Result Date: 03/18/2020 CLINICAL DATA:  Worsening shortness of breath.  COVID positive. EXAM: PORTABLE CHEST 1 VIEW COMPARISON:  03/31/2013 FINDINGS: Cardiac enlargement. Patchy peripheral and basilar infiltration with peribronchial thickening compatible with multifocal pneumonia. No pleural effusions. No pneumothorax. Mediastinal contours appear intact. IMPRESSION: Cardiac enlargement. Patchy bilateral pulmonary infiltrates compatible with multifocal pneumonia. Electronically Signed   By: Lucienne Capers M.D.   On: 03/18/2020 00:12      Phillips Climes M.D on 03/20/2020 at 12:23 PM    [facility] -  Office  [phone]

## 2020-03-20 NOTE — Progress Notes (Signed)
ANTICOAGULATION CONSULT NOTE -Followu up   Pharmacy Consult for Lovenox/Warfarin  Indication: History of PE  Allergies  Allergen Reactions  . Cat Hair Extract Itching  . Diltiazem Anaphylaxis  . Sulfa Antibiotics Hives and Swelling  . Anoro Ellipta [Umeclidinium-Vilanterol]     Too much mucus  . Breo Ellipta [Fluticasone Furoate-Vilanterol]     Too much mucus  . Exalgo [Hydromorphone Hcl] Nausea Only    Sick and nauseated  . Fentanyl Other (See Comments)    REACTION: too sleepy  . Gabapentin   . Hydrocodone-Acetaminophen Nausea Only    REACTION: nausea  . Lopressor [Metoprolol Tartrate]     Asthmatic issues   . Penicillins Hives  . Sulfonamide Derivatives Hives and Swelling  . Tylenol [Acetaminophen]     Liver issues    Patient Measurements: Height: 5\' 11"  (180.3 cm) Weight: (!) 147.3 kg (324 lb 11.8 oz) IBW/kg (Calculated) : 75.3  Vital Signs: Temp: 98.1 F (36.7 C) (09/28 0428) Temp Source: Oral (09/28 0428) BP: 107/59 (09/28 0428) Pulse Rate: 92 (09/28 0428)  Labs: Recent Labs    03/18/20 0011 03/18/20 0011 03/19/20 0500 03/20/20 0615  HGB 16.4   < > 15.4 15.6  HCT 48.7  --  43.8 44.9  PLT 179  --  212 247  APTT 44*  --   --   --   LABPROT 17.1*  --  16.9* 22.0*  INR 1.4*  --  1.4* 2.0*  CREATININE 1.21  --  1.05 0.94   < > = values in this interval not displayed.    Estimated Creatinine Clearance: 110.7 mL/min (by C-G formula based on SCr of 0.94 mg/dL).   Medical History: Past Medical History:  Diagnosis Date  . Anxiety   . Asthma    "no attack since acupuncture in 1990's"  . Complication of anesthesia    SLOW TO WAKE UP / DIFFICULTY RESPONDING 2003, 3 days before could use left arm, "wild/crazy sometimes"  . Cyst of left kidney    "benign"  . DVT (deep venous thrombosis) ([place]) 2003   right femoral artery "from groin to knee"  . Edema    Right Leg w/ h/o DVT postphlebitic  . H/O blood transfusion reaction 2004   FFP, extreme swelling,  could not breath  . Headache(784.0)    MIGRAINES-not for a long time  . History of pulmonary embolism 2003   both lungs  . Hyperlipidemia   . Knee pain    left  . LBP (low back pain)   . 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 Obesity   . Osteoarthritis   . Peripheral vascular disease ([place])    "poor circulation in  RT leg"  . PONV (postoperative nausea and vomiting)    during surgery in 1990's  . Spondylolisthesis   . Warfarin anticoagulation   . Wheezing    when laying on left side    Assessment: 69 y/o M being admitted with acute respiratory failure in the setting of COVID-19 infection. On warfarin PTA for hx of PE. Bridging with Lovenox for while INR subtherapeutic.   Today the INR increased to 2.0 , up from 1.4 yesterday. CBC is within normal, stable.  Renal function within normal, stable. No bleeding reported.   Lovenox stopped as INR is therapeutic.  Goal of Therapy:  INR 2-3 Monitor platelets by anticoagulation protocol: Yes   Plan:  Lovenox discontinued due to INR is therapeutic Warfarin 6 mg PO x 1  today at 1600 (usual PTA dose) Daily PT/INR Monitor for bleeding    Nicole Cella, RPh Clinical Pharmacist Phone: [phone] Please check AMION for all [place] phone numbers After 10:00 PM, call [place] [phone] 03/20/2020 10:56 AM

## 2020-03-21 LAB — CBC WITH DIFFERENTIAL/PLATELET
Abs Immature Granulocytes: 0.11 10*3/uL — ABNORMAL HIGH (ref 0.00–0.07)
Basophils Absolute: 0 10*3/uL (ref 0.0–0.1)
Basophils Relative: 0 %
Eosinophils Absolute: 0 10*3/uL (ref 0.0–0.5)
Eosinophils Relative: 0 %
HCT: 44.8 % (ref 39.0–52.0)
Hemoglobin: 15.5 g/dL (ref 13.0–17.0)
Immature Granulocytes: 1 %
Lymphocytes Relative: 9 %
Lymphs Abs: 1 10*3/uL (ref 0.7–4.0)
MCH: 32.4 pg (ref 26.0–34.0)
MCHC: 34.6 g/dL (ref 30.0–36.0)
MCV: 93.5 fL (ref 80.0–100.0)
Monocytes Absolute: 0.8 10*3/uL (ref 0.1–1.0)
Monocytes Relative: 7 %
Neutro Abs: 8.8 10*3/uL — ABNORMAL HIGH (ref 1.7–7.7)
Neutrophils Relative %: 83 %
Platelets: 243 10*3/uL (ref 150–400)
RBC: 4.79 MIL/uL (ref 4.22–5.81)
RDW: 12.4 % (ref 11.5–15.5)
WBC: 10.7 10*3/uL — ABNORMAL HIGH (ref 4.0–10.5)
nRBC: 0 % (ref 0.0–0.2)

## 2020-03-21 LAB — COMPREHENSIVE METABOLIC PANEL
ALT: 155 U/L — ABNORMAL HIGH (ref 0–44)
AST: 149 U/L — ABNORMAL HIGH (ref 15–41)
Albumin: 2.7 g/dL — ABNORMAL LOW (ref 3.5–5.0)
Alkaline Phosphatase: 40 U/L (ref 38–126)
Anion gap: 12 (ref 5–15)
BUN: 30 mg/dL — ABNORMAL HIGH (ref 8–23)
CO2: 24 mmol/L (ref 22–32)
Calcium: 8.6 mg/dL — ABNORMAL LOW (ref 8.9–10.3)
Chloride: 99 mmol/L (ref 98–111)
Creatinine, Ser: 1.13 mg/dL (ref 0.61–1.24)
GFR calc Af Amer: >60 mL/min (ref >60)
GFR calc non Af Amer: >60 mL/min (ref >60)
Glucose, Bld: 178 mg/dL — ABNORMAL HIGH (ref 70–99)
Potassium: 3.8 mmol/L (ref 3.5–5.1)
Sodium: 135 mmol/L (ref 135–145)
Total Bilirubin: 1.2 mg/dL (ref 0.3–1.2)
Total Protein: 6.7 g/dL (ref 6.5–8.1)

## 2020-03-21 LAB — D-DIMER, QUANTITATIVE: D-Dimer, Quant: <0.27 ug/mL-FEU (ref 0.00–0.50)

## 2020-03-21 LAB — C-REACTIVE PROTEIN: CRP: 0.8 mg/dL (ref <1.0)

## 2020-03-21 LAB — PROTIME-INR
INR: 2.6 — ABNORMAL HIGH (ref 0.8–1.2)
Prothrombin Time: 27.2 seconds — ABNORMAL HIGH (ref 11.4–15.2)

## 2020-03-21 LAB — FERRITIN: Ferritin: 1224 ng/mL — ABNORMAL HIGH (ref 24–336)

## 2020-03-21 MED ORDER — METHYLPREDNISOLONE SODIUM SUCC 125 MG IJ SOLR
60.0000 mg | Freq: Two times a day (BID) | INTRAMUSCULAR | Status: DC
  Administered 2020-03-21 – 2020-03-22 (×2): 60 mg via INTRAVENOUS
  Filled 2020-03-21 (×2): qty 2

## 2020-03-21 MED ORDER — WARFARIN SODIUM 3 MG PO TABS
3.0000 mg | ORAL_TABLET | Freq: Once | ORAL | Status: AC
  Administered 2020-03-21: 3 mg via ORAL
  Filled 2020-03-21: qty 1

## 2020-03-21 NOTE — Progress Notes (Addendum)
PROGRESS NOTE                                                                                                                                                                                                             Patient Demographics:    Robert [name], is a 69 y.o. male, DOB - 10-Jan-1951, ZLD:[id number]  Outpatient Primary MD for the patient is [name], [name], MD   Admit date - 03/17/2020   LOS - 3  Chief Complaint  Patient presents with  . COVID-19  . Fatigue  . Shortness of Breath       Brief Narrative: Patient is a 69 y.o. male with PMHx of VTE, morbid obesity, asthma-diagnosed with COVID-19 on 9/18-admitted with shortness of breath-found to have hypoxia due to Covid 19 pneumonia.  COVID-19 vaccinated status: Unvaccinated  Significant Events: 9/25>> Admit to [facility] for hypoxia due to COVID-19 pneumonia  Significant studies: 9/25>>Chest x-ray: Patchy bilateral pulmonary infiltrates compatible with multifocal pneumonia.  COVID-19 medications: Steroids: 9/25>> Remdesivir: 9/25>> Baricitinib: 9/26>>  Antibiotics: None  Microbiology data: 9/26 blood culture: No growth  Procedures: None  Consults: None  DVT prophylaxis: Warfarin    Subjective:    [name] today feels better today-he was on room air at rest-but still requiring around 3 L with ambulation.   Assessment  & Plan :   Acute Hypoxic Resp Failure due to Covid 19 Viral pneumonia: Slowly improving-hypoxia better-down to room air at rest but requires around 2 L of oxygen with ambulation.  Continue steroids/remdesivir.  Suspect that if improvement continues-he should be able to go home on 9/30.    Will need to assess for home O2 requirement prior to discharge.   Fever: afebrile O2 requirements:  SpO2: 90 % O2 Flow Rate (L/min): 2 L/min   COVID-19 Labs: Recent Labs    03/19/20 0500 03/20/20 0615 03/21/20 0909    DDIMER  --   --  <0.27  FERRITIN 1,042* 1,014* 1,224*  CRP 4.8* 1.7* 0.8    No results found for: BNP  Recent Labs  Lab 03/18/20 0412  PROCALCITON 0.12    Lab Results  Component Value Date   SARSCOV2NAA POSITIVE (A) 03/18/2020     Prone/Incentive Spirometry: encouraged  incentive spirometry use 3-4/hour.  Transaminitis: Secondary to COVID-19/remdesivir use-jump in LFTs today-doubt any further work-up required-reassess tomorrow.  Acute metabolic encephalopathy: Probably secondary to hypoxia-apparently was confused when he first presented to the hospital-but is completely alert and oriented since then.  Bronchial asthma: Stable-continue inhalers.  History of MAT/SVT: Continue telemetry monitoring-plan is to put him on a calcium channel blocker if he were to have arrhythmia on telemetry monitoring.  Chronic steroid use: Resume home dosing of steroids once he is tapered down.    MorbidObesity: Estimated body mass index is 45.29 kg/m as calculated from the following:   Height as of this encounter: 5\' 11"  (1.803 m).   Weight as of this encounter: 147.3 kg.    ABG: No results found for: PHART, PCO2ART, PO2ART, HCO3, TCO2, ACIDBASEDEF, O2SAT  Vent Settings: N/A  Condition -  Guarded  Family Communication  :  Spouse Rip Harbour [phone]) updated over the phone on 9/29  Code Status :  Full Code  Diet :  Diet Order            Diet Heart Room service appropriate? Yes; Fluid consistency: Thin  Diet effective now                  Disposition Plan  :   Status is: Inpatient  Remains inpatient appropriate because:Inpatient level of care appropriate due to severity of illness   Dispo: The patient is from: Home              Anticipated d/c is to: Home              Anticipated d/c date is: 2 days              Patient currently is not medically stable to d/c.   Barriers to discharge: Hypoxia requiring O2 supplementation/complete 5 days of IV  Remdesivir  Antimicorbials  :    Anti-infectives (From admission, onward)   Start     Dose/Rate Route Frequency Ordered Stop   03/19/20 1000  remdesivir 100 mg in sodium chloride 0.9 % 100 mL IVPB       "Followed by" Linked Group Details   100 mg 200 mL/hr over 30 Minutes Intravenous Daily 03/18/20 0303 03/23/20 0959   03/18/20 2200  cefTRIAXone (ROCEPHIN) 1 g in sodium chloride 0.9 % 100 mL IVPB  Status:  Discontinued        1 g 200 mL/hr over 30 Minutes Intravenous Every 24 hours 03/18/20 0013 03/18/20 1508   03/18/20 2200  azithromycin (ZITHROMAX) 500 mg in sodium chloride 0.9 % 250 mL IVPB  Status:  Discontinued        500 mg 250 mL/hr over 60 Minutes Intravenous Every 24 hours 03/18/20 0013 03/18/20 1508   03/18/20 0345  remdesivir 200 mg in sodium chloride 0.9% 250 mL IVPB       "Followed by" Linked Group Details   200 mg 580 mL/hr over 30 Minutes Intravenous Once 03/18/20 0303 03/18/20 0502   03/17/20 2330  levofloxacin (LEVAQUIN) IVPB 750 mg        750 mg 100 mL/hr over 90 Minutes Intravenous  Once 03/17/20 2329 03/18/20 0143      Inpatient Medications  Scheduled Meds: . albuterol  2 puff Inhalation Q6H WA  . vitamin C  500 mg Oral Daily  . baricitinib  4 mg Oral Daily  . furosemide  20 mg Oral Daily  . influenza vaccine adjuvanted  0.5 mL Intramuscular Tomorrow-1000  . melatonin  5 mg Oral QHS  . methylPREDNISolone (SOLU-MEDROL) injection  60 mg Intravenous Q8H  . multivitamin with minerals  1 tablet Oral Daily  . nortriptyline  10 mg Oral QPM  . omega-3 acid ethyl esters  1 g Oral Daily  . pantoprazole  40 mg Oral Daily  . Warfarin - Pharmacist Dosing Inpatient   Does not apply q1600  . zinc sulfate  220 mg Oral Daily   Continuous Infusions: . remdesivir 100 mg in NS 100 mL 100 mg (03/21/20 0913)   PRN Meds:.acetaminophen, fluticasone, guaiFENesin-dextromethorphan, ondansetron **OR** ondansetron (ZOFRAN) IV, senna-docusate   Time Spent in minutes  25  See  all Orders from today for further details   Oren Binet M.D on 03/21/2020 at 11:38 AM  To page go to [email or web address] - use universal password  [facility] -  Office  [phone]    Objective:   Vitals:   03/20/20 2025 03/21/20 0000 03/21/20 0300 03/21/20 0430  BP: 104/75   (!) 103/57  Pulse: 98   93  Resp: 19   20  Temp: 97.9 F (36.6 C)   98.1 F (36.7 C)  TempSrc: Oral   Oral  SpO2: 95% 91% 90% 90%  Weight:      Height:        Wt Readings from Last 3 Encounters:  03/18/20 (!) 147.3 kg  02/01/20 (!) 149.2 kg  12/21/19 (!) 146.1 kg     Intake/Output Summary (Last 24 hours) at 03/21/2020 1138 Last data filed at 03/21/2020 1109 Gross per 24 hour  Intake 980 ml  Output 1920 ml  Net -940 ml     Physical Exam Gen Exam:Alert awake-not in any distress HEENT:atraumatic, normocephalic Chest: B/L clear to auscultation anteriorly CVS:S1S2 regular Abdomen:soft non tender, non distended Extremities:no edema Neurology: Non focal Skin: no rash   Data Review:    CBC Recent Labs  Lab 03/18/20 0011 03/19/20 0500 03/20/20 0615 03/21/20 0909  WBC 4.6 6.5 11.6* 10.7*  HGB 16.4 15.4 15.6 15.5  HCT 48.7 43.8 44.9 44.8  PLT 179 212 247 243  MCV 95.5 94.4 93.9 93.5  MCH 32.2 33.2 32.6 32.4  MCHC 33.7 35.2 34.7 34.6  RDW 12.5 12.2 12.3 12.4  LYMPHSABS 0.7 1.1 1.3 1.0  MONOABS 0.5 0.7 0.8 0.8  EOSABS 0.0 0.0 0.0 0.0  BASOSABS 0.0 0.0 0.0 0.0    Chemistries  Recent Labs  Lab 03/18/20 0011 03/19/20 0500 03/20/20 0615 03/21/20 0909  NA 131* 132* 133* 135  K 4.7 3.8 4.1 3.8  CL 92* 100 98 99  CO2 26 22 22 24   GLUCOSE 137* 160* 147* 178*  BUN 15 19 23  30*  CREATININE 1.21 1.05 0.94 1.13  CALCIUM 8.7* 8.8* 8.8* 8.6*  AST 53* 42* 61* 149*  ALT 36 31 48* 155*  ALKPHOS 45 34* 37* 40  BILITOT 1.0 0.7 1.1 1.2   ------------------------------------------------------------------------------------------------------------------ No results for input(s):  CHOL, HDL, LDLCALC, TRIG, CHOLHDL, LDLDIRECT in the last 72 hours.  Lab Results  Component Value Date   HGBA1C 6.0 12/21/2019   ------------------------------------------------------------------------------------------------------------------ No results for input(s): TSH, T4TOTAL, T3FREE, THYROIDAB in the last 72 hours.  Invalid input(s): FREET3 ------------------------------------------------------------------------------------------------------------------ Recent Labs    03/20/20 0615 03/21/20 0909  FERRITIN 1,014* 1,224*    Coagulation profile Recent Labs  Lab 03/18/20 0011 03/19/20 0500 03/20/20 0615 03/21/20 0909  INR 1.4* 1.4* 2.0* 2.6*    Recent Labs    03/21/20 0909  DDIMER <0.27    Cardiac Enzymes No results for input(s): CKMB, TROPONINI, MYOGLOBIN in the last 168 hours.  Invalid input(s): CK ------------------------------------------------------------------------------------------------------------------ No results found for:  BNP  Micro Results Recent Results (from the past 240 hour(s))  Blood Culture (routine x 2)     Status: None (Preliminary result)   Collection Time: 03/18/20 12:11 AM   Specimen: BLOOD  Result Value Ref Range Status   Specimen Description BLOOD RIGHT ARM  Final   Special Requests   Final    BOTTLES DRAWN AEROBIC AND ANAEROBIC Blood Culture adequate volume   Culture   Final    NO GROWTH 3 DAYS Performed at [facility], [address] [place], [place] [address]    Report Status PENDING  Incomplete  Blood Culture (routine x 2)     Status: None (Preliminary result)   Collection Time: 03/18/20 12:11 AM   Specimen: BLOOD  Result Value Ref Range Status   Specimen Description BLOOD LEFT HAND  Final   Special Requests   Final    BOTTLES DRAWN AEROBIC AND ANAEROBIC Blood Culture results may not be optimal due to an inadequate volume of blood received in culture bottles   Culture   Final    NO GROWTH 3 DAYS Performed at [facility], [place] [address], [place], [place] [address]    Report Status PENDING  Incomplete  Respiratory Panel by RT PCR (Flu A&B, Covid) - Nasopharyngeal Swab     Status: Abnormal   Collection Time: 03/18/20 12:11 AM   Specimen: Nasopharyngeal Swab  Result Value Ref Range Status   SARS Coronavirus 2 by RT PCR POSITIVE (A) NEGATIVE Final    Comment: RESULT CALLED TO, READ BACK BY AND VERIFIED WITH: L. VENEGAS,RN 0146 03/18/2020 T. TYSOR (NOTE) SARS-CoV-2 target nucleic acids are DETECTED.  SARS-CoV-2 RNA is generally detectable in upper respiratory specimens  during the acute phase of infection. Positive results are indicative of the presence of the identified virus, but do not rule out bacterial infection or co-infection with other pathogens not detected by the test. Clinical correlation with patient history and other diagnostic information is necessary to determine patient infection status. The expected result is Negative.  Fact Sheet for Patients:  PinkCheek.be  Fact Sheet for Healthcare Providers: GravelBags.it  This test is not yet approved or cleared by the [place] FDA and  has been authorized for detection and/or diagnosis of SARS-CoV-2 by FDA under an Emergency Use Authorization (EUA).  This EUA will remain in effect (meaning this test can  be used) for the duration of  the COVID-19 declaration under Section 564(b)(1) of the Act, 21 U.S.C. section 360bbb-3(b)(1), unless the authorization is terminated or revoked sooner.      Influenza A by PCR NEGATIVE NEGATIVE Final   Influenza B by PCR NEGATIVE NEGATIVE Final    Comment: (NOTE) The Xpert Xpress SARS-CoV-2/FLU/RSV assay is intended as an aid in  the diagnosis of influenza from Nasopharyngeal swab specimens and  should not be used as a sole basis for treatment. Nasal washings and  aspirates are unacceptable for Xpert Xpress SARS-CoV-2/FLU/RSV   testing.  Fact Sheet for Patients: PinkCheek.be  Fact Sheet for Healthcare Providers: GravelBags.it  This test is not yet approved or cleared by the [place] FDA and  has been authorized for detection and/or diagnosis of SARS-CoV-2 by  FDA under an Emergency Use Authorization (EUA). This EUA will remain  in effect (meaning this test can be used) for the duration of the  Covid-19 declaration under Section 564(b)(1) of the Act, 21  U.S.C. section 360bbb-3(b)(1), unless the authorization is  terminated or revoked. Performed at  [facility], [place] [address] [place], [place] [address]   Urine culture     Status: Abnormal   Collection Time: 03/18/20  2:32 AM   Specimen: In/Out Cath Urine  Result Value Ref Range Status   Specimen Description IN/OUT CATH URINE  Final   Special Requests   Final    NONE Performed at [facility], [place] [address], [place], [place] [address]    Culture MULTIPLE SPECIES PRESENT, SUGGEST RECOLLECTION (A)  Final   Report Status 03/19/2020 FINAL  Final    Radiology Reports DG Chest Port 1 View  Result Date: 03/18/2020 CLINICAL DATA:  Worsening shortness of breath.  COVID positive. EXAM: PORTABLE CHEST 1 VIEW COMPARISON:  03/31/2013 FINDINGS: Cardiac enlargement. Patchy peripheral and basilar infiltration with peribronchial thickening compatible with multifocal pneumonia. No pleural effusions. No pneumothorax. Mediastinal contours appear intact. IMPRESSION: Cardiac enlargement. Patchy bilateral pulmonary infiltrates compatible with multifocal pneumonia. Electronically Signed   By: Lucienne Capers M.D.   On: 03/18/2020 00:12

## 2020-03-21 NOTE — TOC Progression Note (Signed)
Transition of Care [facility]) - Progression Note    Patient Details  Name: Robert [name] MRN: [id number] Date of Birth: 09/11/1950  Transition of Care [facility]) CM/SW [place], RN Phone Number: 03/21/2020, 1:31 PM  Clinical Narrative:    Case manager noted need for home health physical therapy and oxygen therapy.  Spoke to patient via phone due to [place] exposure.  Case manager presented Medicare.gov information for home health agencies, patient did not have a preference and selected for CM to choose agency.  Patient states that does have DME's at the house if needed but no current need for additional DME's noted and physical therapy did not recommend any additional equipment.  Patient states wife will take patient home from hospital, but wife has been sick so had patient choose secondary person for discharge, chose son.  Case manager set up home health PT with [facility], spoke to Amy. Case manager set up for portable oxygen concentrator with [facility], spoke to [place].  No additional needs noted at this time.   Expected Discharge Plan: [place] Barriers to Discharge: No Barriers Identified  Expected Discharge Plan and Services Expected Discharge Plan: [place]   Discharge Planning Services: CM Consult Post Acute Care Choice: NA Living arrangements for the past 2 months: Single Family Home                 DME Arranged: Oxygen DME Agency: [employer] Date DME Agency Contacted: 03/21/20 Time DME Agency Contacted: 53 Representative spoke with at DME Agency: Thedore Mins HH Arranged: PT [place] Date [place]: 03/21/20 Time [place]: 1250 Representative spoke with at [place]: Amy   Social Determinants of Health ([place]) Interventions    Readmission Risk Interventions No flowsheet data found.

## 2020-03-21 NOTE — Progress Notes (Signed)
ANTICOAGULATION CONSULT NOTE -Followu up   Pharmacy Consult for Lovenox/Warfarin  Indication: History of PE  Allergies  Allergen Reactions  . Cat Hair Extract Itching  . Diltiazem Anaphylaxis  . Sulfa Antibiotics Hives and Swelling  . Anoro Ellipta [Umeclidinium-Vilanterol]     Too much mucus  . Breo Ellipta [Fluticasone Furoate-Vilanterol]     Too much mucus  . Exalgo [Hydromorphone Hcl] Nausea Only    Sick and nauseated  . Fentanyl Other (See Comments)    REACTION: too sleepy  . Gabapentin   . Hydrocodone-Acetaminophen Nausea Only    REACTION: nausea  . Lopressor [Metoprolol Tartrate]     Asthmatic issues   . Penicillins Hives  . Sulfonamide Derivatives Hives and Swelling  . Tylenol [Acetaminophen]     Liver issues    Patient Measurements: Height: 5\' 11"  (180.3 cm) Weight: (!) 147.3 kg (324 lb 11.8 oz) IBW/kg (Calculated) : 75.3  Vital Signs: Temp: 98.2 F (36.8 C) (09/29 1356) Temp Source: Oral (09/29 1356) BP: 117/67 (09/29 1356) Pulse Rate: 93 (09/29 0430)  Labs: Recent Labs    03/19/20 0500 03/19/20 0500 03/20/20 0615 03/21/20 0909  HGB 15.4   < > 15.6 15.5  HCT 43.8  --  44.9 44.8  PLT 212  --  247 243  LABPROT 16.9*  --  22.0* 27.2*  INR 1.4*  --  2.0* 2.6*  CREATININE 1.05  --  0.94 1.13   < > = values in this interval not displayed.    Estimated Creatinine Clearance: 92.1 mL/min (by C-G formula based on SCr of 1.13 mg/dL).   Medical History: Past Medical History:  Diagnosis Date  . Anxiety   . Asthma    "no attack since acupuncture in 1990's"  . Complication of anesthesia    SLOW TO WAKE UP / DIFFICULTY RESPONDING 2003, 3 days before could use left arm, "wild/crazy sometimes"  . Cyst of left kidney    "benign"  . DVT (deep venous thrombosis) ([place]) 2003   right femoral artery "from groin to knee"  . Edema    Right Leg w/ h/o DVT postphlebitic  . H/O blood transfusion reaction 2004   FFP, extreme swelling, could not breath  .  Headache(784.0)    MIGRAINES-not for a long time  . History of pulmonary embolism 2003   both lungs  . Hyperlipidemia   . Knee pain    left  . LBP (low back pain)   . 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 Obesity   . Osteoarthritis   . Peripheral vascular disease ([place])    "poor circulation in  RT leg"  . PONV (postoperative nausea and vomiting)    during surgery in 1990's  . Spondylolisthesis   . Warfarin anticoagulation   . Wheezing    when laying on left side    Assessment: 69 y/o M being admitted with acute respiratory failure in the setting of COVID-19 infection. On warfarin PTA for hx of PE. Bridging with Lovenox for while INR subtherapeutic. Today the INR increased 2.0>>2.6. Lovenox stopped 9/28 as INR is therapeutic.  Goal of Therapy:  INR 2-3 Monitor platelets by anticoagulation protocol: Yes   Plan:  Warfarin 3mg  PO x 1 today at 1600 (usual PTA dose) Daily PT/INR - watch INR trend, may need lower dose if continues to trend up Monitor CBC, for s/sx bleeding   Arturo Morton, PharmD, BCPS Please check AMION for all [place] contact numbers Clinical  Pharmacist 03/21/2020 2:30 PM

## 2020-03-21 NOTE — Evaluation (Signed)
Physical Therapy Evaluation Patient Details Name: SALMAAN PATCHIN MRN: [id number] DOB: [birth year] Today's Date: 03/21/2020   History of Present Illness  Pt is a 69 y.o. male dx with COVID-19 on 03/10/20, now admitted 03/17/20 with worsening SOB, weakness, and intermittent confusion. Workup for acute hypoxic respiratory failure secondary to COVID-19 PNA. PMH includes obesity, asthma, PE, HTN; pt unvaccinated.   Clinical Impression  Pt presents with an overall decrease in functional mobility secondary to above. PTA, pt independent, lives with wife. Initiated education re: current condition, O2 needs, activity recommendations, therex, energy conservation, and importance of mobility. Today, pt able to perform standing mobility, including ambulation, with intermittent min guard for balance; significant DOE with minimal activity requiring seated rest. SpO2 down to 85% on RA, maintaining >/88% on 3L O2 [place] with activity. Pt would benefit from continued acute PT services to maximize functional mobility and independence prior to d/c home.    Follow Up Recommendations No PT follow up    Equipment Recommendations  None recommended by PT    Recommendations for Other Services       Precautions / Restrictions Precautions Precautions: Other (comment) Precaution Comments: Watch SpO2 and HR Restrictions Weight Bearing Restrictions: No      Mobility  Bed Mobility               General bed mobility comments: Received sitting in recliner  Transfers Overall transfer level: Needs assistance Equipment used: None Transfers: Sit to/from Stand Sit to Stand: Supervision         General transfer comment: Able to stand from recliner and BSC, reliant on UE support  Ambulation/Gait Ambulation/Gait assistance: Min guard Gait Distance (Feet): 100 Feet Assistive device: None Gait Pattern/deviations: Step-through pattern;Decreased stride length Gait velocity: Decreased   General Gait Details:  Slow, fatigued gait without DME, and intermittent min guard for balance; 1x self-corrected LOB. DOE 3/4 requiring seated rest, walking 34' + 70'. SpO2 down to 85% on RA, maintaining >/88% on 3L  Stairs            Wheelchair Mobility    Modified Rankin (Stroke Patients Only)       Balance Overall balance assessment: Needs assistance   Sitting balance-Leahy Scale: Good       Standing balance-Leahy Scale: Good                               Pertinent Vitals/Pain Pain Assessment: No/denies pain    Home Living Family/patient expects to be discharged to:: Private residence Living Arrangements: Spouse/significant other Available Help at Discharge: Family;Available 24 hours/day Type of Home: House Home Access: Stairs to enter Entrance Stairs-Rails: Psychiatric nurse of Steps: 4-6 Home Layout: One level Home Equipment: Clinical cytogeneticist - 2 wheels;Cane - single point;Wheelchair - manual;Transport chair;Hospital bed      Prior Function Level of Independence: Independent         Comments: Up to walking 3/4 mile with walking sticks due to h/o back issues with BLE numbness/weakness     Hand Dominance        Extremity/Trunk Assessment   Upper Extremity Assessment Upper Extremity Assessment: Overall WFL for tasks assessed    Lower Extremity Assessment Lower Extremity Assessment: Overall WFL for tasks assessed (BLE swelling (R>L) with h/o DVT and chronic BLE numbness/tingling)       Communication   Communication: No difficulties  Cognition Arousal/Alertness: Awake/alert Behavior During Therapy: WFL for tasks assessed/performed Overall Cognitive Status:  Within Functional Limits for tasks assessed                                        General Comments General comments (skin integrity, edema, etc.): HR 88-134    Exercises Other Exercises Other Exercises: LAQ, ankle pumps, sit<>stand, marching in place (within  confines of lines)   Assessment/Plan    PT Assessment Patient needs continued PT services  PT Problem List Decreased strength;Decreased activity tolerance;Decreased balance;Decreased mobility;Cardiopulmonary status limiting activity;Obesity       PT Treatment Interventions DME instruction;Gait training;Stair training;Functional mobility training;Therapeutic activities;Therapeutic exercise;Balance training;Patient/family education    PT Goals (Current goals can be found in the Care Plan section)  Acute Rehab PT Goals Patient Stated Goal: Return home "where I can move more" PT Goal Formulation: With patient Time For Goal Achievement: 04/04/20 Potential to Achieve Goals: Good    Frequency Min 3X/week   Barriers to discharge        Co-evaluation               AM-PAC PT "6 Clicks" Mobility  Outcome Measure Help needed turning from your back to your side while in a flat bed without using bedrails?: None Help needed moving from lying on your back to sitting on the side of a flat bed without using bedrails?: None Help needed moving to and from a bed to a chair (including a wheelchair)?: None Help needed standing up from a chair using your arms (e.g., wheelchair or bedside chair)?: None Help needed to walk in hospital room?: A Little Help needed climbing 3-5 steps with a railing? : A Little 6 Click Score: 22    End of Session Equipment Utilized During Treatment: Oxygen Activity Tolerance: Patient tolerated treatment well Patient left: in chair;with call bell/phone within reach;with nursing/sitter in room Nurse Communication: Mobility status PT Visit Diagnosis: Other abnormalities of gait and mobility (R26.89)    Time: 0272-5366 PT Time Calculation (min) (ACUTE ONLY): 29 min   Charges:   PT Evaluation $PT Eval Moderate Complexity: 1 Mod PT Treatments $Therapeutic Exercise: 8-22 mins   Mabeline Caras, PT, DPT Acute Rehabilitation Services  Pager [phone] Office  [place] 03/21/2020, 10:04 AM

## 2020-03-21 NOTE — Progress Notes (Signed)
SATURATION QUALIFICATIONS: (This note is used to comply with regulatory documentation for home oxygen)  Patient Saturations on Room Air at Rest = 89%  Patient Saturations on Room Air while Ambulating = 84%  Patient Saturations on 3 Liters of oxygen while Ambulating = 91%  Please briefly explain why patient needs home oxygen: Pt requiring O2 at this time

## 2020-03-22 LAB — COMPREHENSIVE METABOLIC PANEL
ALT: 224 U/L — ABNORMAL HIGH (ref 0–44)
AST: 137 U/L — ABNORMAL HIGH (ref 15–41)
Albumin: 2.9 g/dL — ABNORMAL LOW (ref 3.5–5.0)
Alkaline Phosphatase: 43 U/L (ref 38–126)
Anion gap: 13 (ref 5–15)
BUN: 28 mg/dL — ABNORMAL HIGH (ref 8–23)
CO2: 25 mmol/L (ref 22–32)
Calcium: 8.5 mg/dL — ABNORMAL LOW (ref 8.9–10.3)
Chloride: 98 mmol/L (ref 98–111)
Creatinine, Ser: 1.14 mg/dL (ref 0.61–1.24)
GFR calc Af Amer: >60 mL/min (ref >60)
GFR calc non Af Amer: >60 mL/min (ref >60)
Glucose, Bld: 223 mg/dL — ABNORMAL HIGH (ref 70–99)
Potassium: 3.6 mmol/L (ref 3.5–5.1)
Sodium: 136 mmol/L (ref 135–145)
Total Bilirubin: 1.1 mg/dL (ref 0.3–1.2)
Total Protein: 6.7 g/dL (ref 6.5–8.1)

## 2020-03-22 LAB — CBC WITH DIFFERENTIAL/PLATELET
Abs Immature Granulocytes: 0.24 10*3/uL — ABNORMAL HIGH (ref 0.00–0.07)
Basophils Absolute: 0 10*3/uL (ref 0.0–0.1)
Basophils Relative: 0 %
Eosinophils Absolute: 0 10*3/uL (ref 0.0–0.5)
Eosinophils Relative: 0 %
HCT: 46.8 % (ref 39.0–52.0)
Hemoglobin: 15.9 g/dL (ref 13.0–17.0)
Immature Granulocytes: 2 %
Lymphocytes Relative: 6 %
Lymphs Abs: 0.7 10*3/uL (ref 0.7–4.0)
MCH: 31.8 pg (ref 26.0–34.0)
MCHC: 34 g/dL (ref 30.0–36.0)
MCV: 93.6 fL (ref 80.0–100.0)
Monocytes Absolute: 0.7 10*3/uL (ref 0.1–1.0)
Monocytes Relative: 6 %
Neutro Abs: 9.7 10*3/uL — ABNORMAL HIGH (ref 1.7–7.7)
Neutrophils Relative %: 86 %
Platelets: 245 10*3/uL (ref 150–400)
RBC: 5 MIL/uL (ref 4.22–5.81)
RDW: 12.3 % (ref 11.5–15.5)
WBC: 11.4 10*3/uL — ABNORMAL HIGH (ref 4.0–10.5)
nRBC: 0 % (ref 0.0–0.2)

## 2020-03-22 LAB — PROTIME-INR
INR: 2.7 — ABNORMAL HIGH (ref 0.8–1.2)
Prothrombin Time: 28.1 seconds — ABNORMAL HIGH (ref 11.4–15.2)

## 2020-03-22 LAB — C-REACTIVE PROTEIN: CRP: 0.7 mg/dL (ref <1.0)

## 2020-03-22 LAB — D-DIMER, QUANTITATIVE: D-Dimer, Quant: 0.63 ug/mL-FEU — ABNORMAL HIGH (ref 0.00–0.50)

## 2020-03-22 MED ORDER — ALBUTEROL SULFATE HFA 108 (90 BASE) MCG/ACT IN AERS: 2.0000 | INHALATION_SPRAY | Freq: Four times a day (QID) | RESPIRATORY_TRACT | 0 refills | Status: AC | PRN

## 2020-03-22 MED ORDER — PREDNISONE 10 MG PO TABS: ORAL_TABLET | ORAL | 0 refills | Status: DC

## 2020-03-22 MED ORDER — BENZONATATE 100 MG PO CAPS: 100.0000 mg | ORAL_CAPSULE | Freq: Four times a day (QID) | ORAL | 0 refills | Status: DC | PRN

## 2020-03-22 NOTE — Discharge Instructions (Signed)
Person Under Monitoring Name: Robert [name]  Location: [place] [address]   Infection Prevention Recommendations for Individuals Confirmed to have, or Being Evaluated for, 2019 Novel Coronavirus (COVID-19) Infection Who Receive Care at Home  Individuals who are confirmed to have, or are being evaluated for, COVID-19 should follow the prevention steps below until a healthcare provider or local or state health department says they can return to normal activities.  Stay home except to get medical care You should restrict activities outside your home, except for getting medical care. Do not go to work, school, or public areas, and do not use public transportation or taxis.  Call ahead before visiting your doctor Before your medical appointment, call the healthcare provider and tell them that you have, or are being evaluated for, COVID-19 infection. This will help the healthcare provider's office take steps to keep other people from getting infected. Ask your healthcare provider to call the local or state health department.  Monitor your symptoms Seek prompt medical attention if your illness is worsening (e.g., difficulty breathing). Before going to your medical appointment, call the healthcare provider and tell them that you have, or are being evaluated for, COVID-19 infection. Ask your healthcare provider to call the local or state health department.  Wear a facemask You should wear a facemask that covers your nose and mouth when you are in the same room with other people and when you visit a healthcare provider. People who live with or visit you should also wear a facemask while they are in the same room with you.  Separate yourself from other people in your home As much as possible, you should stay in a different room from other people in your home. Also, you should use a separate bathroom, if available.  Avoid sharing household items You should  not share dishes, drinking glasses, cups, eating utensils, towels, bedding, or other items with other people in your home. After using these items, you should wash them thoroughly with soap and water.  Cover your coughs and sneezes Cover your mouth and nose with a tissue when you cough or sneeze, or you can cough or sneeze into your sleeve. Throw used tissues in a lined trash can, and immediately wash your hands with soap and water for at least 20 seconds or use an alcohol-based hand rub.  Wash your [facility] your hands often and thoroughly with soap and water for at least 20 seconds. You can use an alcohol-based hand sanitizer if soap and water are not available and if your hands are not visibly dirty. Avoid touching your eyes, nose, and mouth with unwashed hands.   Prevention Steps for Caregivers and Household Members of Individuals Confirmed to have, or Being Evaluated for, COVID-19 Infection Being Cared for in the Home  If you live with, or provide care at home for, a person confirmed to have, or being evaluated for, COVID-19 infection please follow these guidelines to prevent infection:  Follow healthcare provider's instructions Make sure that you understand and can help the patient follow any healthcare provider instructions for all care.  Provide for the patient's basic needs You should help the patient with basic needs in the home and provide support for getting groceries, prescriptions, and other personal needs.  Monitor the patient's symptoms If they are getting sicker, call his or her medical provider and tell them that the patient has, or is being evaluated for, COVID-19 infection. This will help the healthcare provider's  office take steps to keep other people from getting infected. Ask the healthcare provider to call the local or state health department.  Limit the number of people who have contact with the patient  If possible, have only one caregiver for the  patient.  Other household members should stay in another home or place of residence. If this is not possible, they should stay  in another room, or be separated from the patient as much as possible. Use a separate bathroom, if available.  Restrict visitors who do not have an essential need to be in the home.  Keep older adults, very young children, and other sick people away from the patient Keep older adults, very young children, and those who have compromised immune systems or chronic health conditions away from the patient. This includes people with chronic heart, lung, or kidney conditions, diabetes, and cancer.  Ensure good ventilation Make sure that shared spaces in the home have good air flow, such as from an air conditioner or an opened window, weather permitting.  Wash your hands often  Wash your hands often and thoroughly with soap and water for at least 20 seconds. You can use an alcohol based hand sanitizer if soap and water are not available and if your hands are not visibly dirty.  Avoid touching your eyes, nose, and mouth with unwashed hands.  Use disposable paper towels to dry your hands. If not available, use dedicated cloth towels and replace them when they become wet.  Wear a facemask and gloves  Wear a disposable facemask at all times in the room and gloves when you touch or have contact with the patient's blood, body fluids, and/or secretions or excretions, such as sweat, saliva, sputum, nasal mucus, vomit, urine, or feces.  Ensure the mask fits over your nose and mouth tightly, and do not touch it during use.  Throw out disposable facemasks and gloves after using them. Do not reuse.  Wash your hands immediately after removing your facemask and gloves.  If your personal clothing becomes contaminated, carefully remove clothing and launder. Wash your hands after handling contaminated clothing.  Place all used disposable facemasks, gloves, and other waste in a lined  container before disposing them with other household waste.  Remove gloves and wash your hands immediately after handling these items.  Do not share dishes, glasses, or other household items with the patient  Avoid sharing household items. You should not share dishes, drinking glasses, cups, eating utensils, towels, bedding, or other items with a patient who is confirmed to have, or being evaluated for, COVID-19 infection.  After the person uses these items, you should wash them thoroughly with soap and water.  Wash laundry thoroughly  Immediately remove and wash clothes or bedding that have blood, body fluids, and/or secretions or excretions, such as sweat, saliva, sputum, nasal mucus, vomit, urine, or feces, on them.  Wear gloves when handling laundry from the patient.  Read and follow directions on labels of laundry or clothing items and detergent. In general, wash and dry with the warmest temperatures recommended on the label.  Clean all areas the individual has used often  Clean all touchable surfaces, such as counters, tabletops, doorknobs, bathroom fixtures, toilets, phones, keyboards, tablets, and bedside tables, every day. Also, clean any surfaces that may have blood, body fluids, and/or secretions or excretions on them.  Wear gloves when cleaning surfaces the patient has come in contact with.  Use a diluted bleach solution (e.g., dilute bleach with 1  part bleach and 10 parts water) or a household disinfectant with a label that says EPA-registered for coronaviruses. To make a bleach solution at home, add 1 tablespoon of bleach to 1 quart (4 cups) of water. For a larger supply, add  cup of bleach to 1 gallon (16 cups) of water.  Read labels of cleaning products and follow recommendations provided on product labels. Labels contain instructions for safe and effective use of the cleaning product including precautions you should take when applying the product, such as wearing gloves or  eye protection and making sure you have good ventilation during use of the product.  Remove gloves and wash hands immediately after cleaning.  Monitor yourself for signs and symptoms of illness Caregivers and household members are considered close contacts, should monitor their health, and will be asked to limit movement outside of the home to the extent possible. Follow the monitoring steps for close contacts listed on the symptom monitoring form.   ? If you have additional questions, contact your local health department or call the epidemiologist on call at [phone] (available 24/7). ? This guidance is subject to change. For the most up-to-date guidance from [facility], please refer to their website: [email or web address]

## 2020-03-22 NOTE — Care Management Important Message (Signed)
Important Message  Patient Details  Name: Robert [name] MRN: [id number] Date of Birth: 06-Apr-1951   Medicare Important Message Given:  Yes - Important Message mailed due to current National Emergency  Verbal consent obtained due to current National Emergency  Relationship to patient: Self Contact Name: Delshon Blanchfield Call Date: 03/22/20  Time: 1408 Phone: [phone] Outcome: Spoke with contact Important Message mailed to: Patient address on file    [place] 03/22/2020, 2:09 PM

## 2020-03-22 NOTE — Discharge Summary (Addendum)
PATIENT DETAILS Name: Robert [name] Age: 69 y.o. Sex: male Date of Birth: 1951-05-10 MRN: [id number]. Admitting Physician: Athena Masse, MD ZWC:[email or web address], [name], MD  Admit Date: 03/17/2020 Discharge date: 03/22/2020  Recommendations for Outpatient Follow-up:  1. Follow up with PCP in 1-2 weeks 2. Please obtain CMP/CBC in one week 3. Repeat Chest Xray in 4-6 week 4. PCP to reassess whether patient still requires home O2 at next visit  Admitted From:  Home  Disposition: [place]: No  Equipment/Devices: Oxygen (1-2 L at rest-3 L with ambulation)   Discharge Condition: Stable  CODE STATUS: FULL CODE  Diet recommendation:  Diet Order            Diet - low sodium heart healthy           Diet Heart Room service appropriate? Yes; Fluid consistency: Thin  Diet effective now                  Brief Narrative: Patient is a 69 y.o. male with PMHx of VTE, morbid obesity, asthma-diagnosed with COVID-19 on 9/18-admitted with shortness of breath-found to have hypoxia due to Covid 19 pneumonia.  COVID-19 vaccinated status: Unvaccinated  Significant Events: 9/25>> Admit to [facility] for hypoxia due to COVID-19 pneumonia  Significant studies: 9/25>>Chest x-ray: Patchy bilateral pulmonary infiltrates compatible with multifocal pneumonia.  COVID-19 medications: Steroids: 9/25>> Remdesivir: 9/25>>9/30 Baricitinib: 9/26>>9/30  Antibiotics: None  Microbiology data: 9/26 blood culture: No growth  Procedures: None  Consults: None  Brief Hospital Course: Acute Hypoxic Resp Failure due to Covid 19 Viral pneumonia:  Improved-feels much better than on initial presentation-either on room air or 1-2 L at rest, but does require around 3 L of oxygen with ambulation.  Per patient-he does have some mild shortness of breath with exertion but feels significantly better than on initial presentation.  Patient was treated with steroids and IV Remdesivir  along with baricitinib.  Since clinically improved-suspect stable to be discharged home-we will complete Remdesivir on 9/30-we will continue tapering prednisone discharge.  Will be discharged on home O2-PCP to reassess in the next few weeks for the patient still requires oxygen.    COVID-19 Labs:  Recent Labs    03/20/20 0615 03/21/20 0909 03/22/20 1118  DDIMER  --  <0.27 0.63*  FERRITIN 1,014* 1,224*  --   CRP 1.7* 0.8 0.7    Lab Results  Component Value Date   SARSCOV2NAA POSITIVE (A) 03/18/2020     Transaminitis: Secondary to COVID-19/remdesivir use-transaminases continue to be elevated-PCP to repeat LFTs in 1 week.  If still persistently elevated-consider outpatient GI evaluation.  Acute metabolic encephalopathy: Probably secondary to hypoxia-apparently was confused when he first presented to the hospital-but is completely alert and oriented since then.  Bronchial asthma: Stable-continue inhalers.  History of MAT/SVT: Continue telemetry monitoring-plan is to put him on a calcium channel blocker if he were to have arrhythmia on telemetry monitoring.  HTN: Resume Lasix-continue to hold losartan-follow with PCP-to reassess whether losartan needs to be resumed.  Chronic steroid use: Resume home dosing of steroids once he is tapered down.    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  Evaluated by PT-and not felt to require outpatient services.  MorbidObesity: Estimated body mass index is 45.29 kg/m as calculated  from the following:   Height as of this encounter: 5\' 11"  (1.803 m).   Weight as of this encounter: 147.3 kg.     Discharge Diagnoses:  Principal Problem:   Acute respiratory failure due to COVID-19 [facility]) Active Problems:   Obesity, Class  III, BMI 40-49.9 (morbid obesity) (HCC)   Asthma   History of pulmonary embolism   Long term (current) use of anticoagulants   Essential hypertension   Pneumonia due to COVID-19 virus   Discharge Instructions:    Person Under Monitoring Name: Robert [name]  Location: [address] [place] [place] [address]   Infection Prevention Recommendations for Individuals Confirmed to have, or Being Evaluated for, 2019 Novel Coronavirus (COVID-19) Infection Who Receive Care at Home  Individuals who are confirmed to have, or are being evaluated for, COVID-19 should follow the prevention steps below until a healthcare provider or local or state health department says they can return to normal activities.  Stay home except to get medical care You should restrict activities outside your home, except for getting medical care. Do not go to work, school, or public areas, and do not use public transportation or taxis.  Call ahead before visiting your doctor Before your medical appointment, call the healthcare provider and tell them that you have, or are being evaluated for, COVID-19 infection. This will help the healthcare provider's office take steps to keep other people from getting infected. Ask your healthcare provider to call the local or state health department.  Monitor your symptoms Seek prompt medical attention if your illness is worsening (e.g., difficulty breathing). Before going to your medical appointment, call the healthcare provider and tell them that you have, or are being evaluated for, COVID-19 infection. Ask your healthcare provider to call the local or state health department.  Wear a facemask You should wear a facemask that covers your nose and mouth when you are in the same room with other people and when you visit a healthcare provider. People who live with or visit you should also wear a facemask while they are in the same room with you.  Separate yourself from  other people in your home As much as possible, you should stay in a different room from other people in your home. Also, you should use a separate bathroom, if available.  Avoid sharing household items You should not share dishes, drinking glasses, cups, eating utensils, towels, bedding, or other items with other people in your home. After using these items, you should wash them thoroughly with soap and water.  Cover your coughs and sneezes Cover your mouth and nose with a tissue when you cough or sneeze, or you can cough or sneeze into your sleeve. Throw used tissues in a lined trash can, and immediately wash your hands with soap and water for at least 20 seconds or use an alcohol-based hand rub.  Wash your [facility] your hands often and thoroughly with soap and water for at least 20 seconds. You can use an alcohol-based hand sanitizer if soap and water are not available and if your hands are not visibly dirty. Avoid touching your eyes, nose, and mouth with unwashed hands.   Prevention Steps for Caregivers and Household Members of Individuals Confirmed to have, or Being Evaluated for, COVID-19 Infection Being Cared for in the Home  If you live with, or provide care at home for, a person confirmed to have, or being evaluated for, COVID-19 infection please follow these guidelines to prevent infection:  Follow healthcare provider's instructions Make sure that you understand and can help the patient follow any healthcare provider instructions for all care.  Provide for the patient's basic needs You should help the patient with basic needs in the home and provide support for getting groceries, prescriptions, and other personal needs.  Monitor the patient's symptoms If they are getting sicker, call his or her medical provider and tell them that the patient has, or is being evaluated for, COVID-19 infection. This will help the healthcare provider's office take steps to keep other people  from getting infected. Ask the healthcare provider to call the local or state health department.  Limit the number of people who have contact with the patient  If possible, have only one caregiver for the patient.  Other household members should stay in another home or place of residence. If this is not possible, they should stay  in another room, or be separated from the patient as much as possible. Use a separate bathroom, if available.  Restrict visitors who do not have an essential need to be in the home.  Keep older adults, very young children, and other sick people away from the patient Keep older adults, very young children, and those who have compromised immune systems or chronic health conditions away from the patient. This includes people with chronic heart, lung, or kidney conditions, diabetes, and cancer.  Ensure good ventilation Make sure that shared spaces in the home have good air flow, such as from an air conditioner or an opened window, weather permitting.  Wash your hands often  Wash your hands often and thoroughly with soap and water for at least 20 seconds. You can use an alcohol based hand sanitizer if soap and water are not available and if your hands are not visibly dirty.  Avoid touching your eyes, nose, and mouth with unwashed hands.  Use disposable paper towels to dry your hands. If not available, use dedicated cloth towels and replace them when they become wet.  Wear a facemask and gloves  Wear a disposable facemask at all times in the room and gloves when you touch or have contact with the patient's blood, body fluids, and/or secretions or excretions, such as sweat, saliva, sputum, nasal mucus, vomit, urine, or feces.  Ensure the mask fits over your nose and mouth tightly, and do not touch it during use.  Throw out disposable facemasks and gloves after using them. Do not reuse.  Wash your hands immediately after removing your facemask and gloves.  If  your personal clothing becomes contaminated, carefully remove clothing and launder. Wash your hands after handling contaminated clothing.  Place all used disposable facemasks, gloves, and other waste in a lined container before disposing them with other household waste.  Remove gloves and wash your hands immediately after handling these items.  Do not share dishes, glasses, or other household items with the patient  Avoid sharing household items. You should not share dishes, drinking glasses, cups, eating utensils, towels, bedding, or other items with a patient who is confirmed to have, or being evaluated for, COVID-19 infection.  After the person uses these items, you should wash them thoroughly with soap and water.  Wash laundry thoroughly  Immediately remove and wash clothes or bedding that have blood, body fluids, and/or secretions or excretions, such as sweat, saliva, sputum, nasal mucus, vomit, urine, or feces, on them.  Wear gloves when handling laundry from the patient.  Read and follow directions on labels of laundry or  clothing items and detergent. In general, wash and dry with the warmest temperatures recommended on the label.  Clean all areas the individual has used often  Clean all touchable surfaces, such as counters, tabletops, doorknobs, bathroom fixtures, toilets, phones, keyboards, tablets, and bedside tables, every day. Also, clean any surfaces that may have blood, body fluids, and/or secretions or excretions on them.  Wear gloves when cleaning surfaces the patient has come in contact with.  Use a diluted bleach solution (e.g., dilute bleach with 1 part bleach and 10 parts water) or a household disinfectant with a label that says EPA-registered for coronaviruses. To make a bleach solution at home, add 1 tablespoon of bleach to 1 quart (4 cups) of water. For a larger supply, add  cup of bleach to 1 gallon (16 cups) of water.  Read labels of cleaning products and follow  recommendations provided on product labels. Labels contain instructions for safe and effective use of the cleaning product including precautions you should take when applying the product, such as wearing gloves or eye protection and making sure you have good ventilation during use of the product.  Remove gloves and wash hands immediately after cleaning.  Monitor yourself for signs and symptoms of illness Caregivers and household members are considered close contacts, should monitor their health, and will be asked to limit movement outside of the home to the extent possible. Follow the monitoring steps for close contacts listed on the symptom monitoring form.   ? If you have additional questions, contact your local health department or call the epidemiologist on call at [phone] (available 24/7). ? This guidance is subject to change. For the most up-to-date guidance from CDC, please refer to their website: YouBlogs.pl    Activity:  As tolerated with Full fall precautions use walker/cane & assistance as needed  Discharge Instructions    Call MD for:  difficulty breathing, headache or visual disturbances   Complete by: As directed    Call MD for:  persistant dizziness or light-headedness   Complete by: As directed    Diet - low sodium heart healthy   Complete by: As directed    Discharge instructions   Complete by: As directed    1.)  21 days of isolation from 9/18  2.)  Please ask your primary care practitioner to repeat liver function tests in 1 week   3.)  Please ask your primary care practitioner to repeat two-view chest x-ray in 4 to 6 weeks.  4.)  Your BP medications were adjusted-please resume furosemide-continue to hold losartan until seen by your primary care practitioner.  5.)  If shortness of breath worsens markedly-please seek immediate medical attention.   Follow with Primary MD  [name], [name], MD  in 1-2 weeks  Please get a complete blood count and chemistry panel checked by your Primary MD at your next visit, and again as instructed by your Primary MD.  Get Medicines reviewed and adjusted: Please take all your medications with you for your next visit with your Primary MD  Laboratory/radiological data: Please request your Primary MD to go over all hospital tests and procedure/radiological results at the follow up, please ask your Primary MD to get all Hospital records sent to his/her office.  In some cases, they will be blood work, cultures and biopsy results pending at the time of your discharge. Please request that your primary care M.D. follows up on these results.  Also Note the following: If you experience worsening of your admission  symptoms, develop shortness of breath, life threatening emergency, suicidal or homicidal thoughts you must seek medical attention immediately by calling 911 or calling your MD immediately  if symptoms less severe.  You must read complete instructions/literature along with all the possible adverse reactions/side effects for all the Medicines you take and that have been prescribed to you. Take any new Medicines after you have completely understood and accpet all the possible adverse reactions/side effects.   Do not drive when taking Pain medications or sleeping medications (Benzodaizepines)  Do not take more than prescribed Pain, Sleep and Anxiety Medications. It is not advisable to combine anxiety,sleep and pain medications without talking with your primary care practitioner  Special Instructions: If you have smoked or chewed Tobacco  in the last 2 yrs please stop smoking, stop any regular Alcohol  and or any Recreational drug use.  Wear Seat belts while driving.  Please note: You were cared for by a hospitalist during your hospital stay. Once you are discharged, your primary care physician will handle any further medical issues. Please note that NO  REFILLS for any discharge medications will be authorized once you are discharged, as it is imperative that you return to your primary care physician (or establish a relationship with a primary care physician if you do not have one) for your post hospital discharge needs so that they can reassess your need for medications and monitor your lab values.   Increase activity slowly   Complete by: As directed      Allergies as of 03/22/2020      Reactions   Cat Hair Extract Itching   Diltiazem Anaphylaxis   Sulfa Antibiotics Hives, Swelling   Anoro Ellipta [umeclidinium-vilanterol]    Too much mucus   Breo Ellipta [fluticasone Furoate-vilanterol]    Too much mucus   Exalgo [hydromorphone Hcl] Nausea Only   Sick and nauseated   Fentanyl Other (See Comments)   REACTION: too sleepy   Gabapentin    Hydrocodone-acetaminophen Nausea Only   REACTION: nausea   Lopressor [metoprolol Tartrate]    Asthmatic issues    Penicillins Hives   Sulfonamide Derivatives Hives, Swelling   Tylenol [acetaminophen]    Liver issues      Medication List    STOP taking these medications   losartan 50 MG tablet Commonly known as: COZAAR   Portable Compressor Nebulizer Misc   Vitamin B-12 5000 MCG Subl     TAKE these medications   albuterol (2.5 MG/3ML) 0.083% nebulizer solution Commonly known as: PROVENTIL Take 3 mLs (2.5 mg total) by nebulization every 4 (four) hours as needed for wheezing or shortness of breath. Dx  J45.909 What changed: Another medication with the same name was added. Make sure you understand how and when to take each.   albuterol 108 (90 Base) MCG/ACT inhaler Commonly known as: VENTOLIN HFA Inhale 2 puffs into the lungs every 6 (six) hours as needed for wheezing or shortness of breath. What changed: You were already taking a medication with the same name, and this prescription was added. Make sure you understand how and when to take each.   benzonatate 100 MG capsule Commonly  known as: Tessalon Perles Take 1 capsule (100 mg total) by mouth every 6 (six) hours as needed for cough.   fish oil-omega-3 fatty acids 1000 MG capsule Take 2 g by mouth every morning.   fluticasone 50 MCG/ACT nasal spray Commonly known as: FLONASE Place 2 sprays into both nostrils daily. What changed:   when  to take this  reasons to take this   furosemide 20 MG tablet Commonly known as: LASIX Take 1 tablet (20 mg total) by mouth daily.   glucosamine-chondroitin 500-400 MG tablet Take 1 tablet by mouth every morning.   nortriptyline 10 MG capsule Commonly known as: PAMELOR Take 1 capsule (10 mg total) by mouth every evening.   predniSONE 10 MG tablet Commonly known as: DELTASONE 4 tablets (40 mg) daily for 2 days, 3 tablets (30 mg) daily for 2 days, 2 tablets (20 mg) daily for 2 days, 1 tablet (10 mg) daily for 2 days-and then resume 0.5 tablet (5 mg) daily What changed:   medication strength  additional instructions   Vitamin D3 125 MCG (5000 UT) Caps Take 1 capsule (5,000 Units total) by mouth daily.   warfarin 6 MG tablet Commonly known as: COUMADIN Take as directed. If you are unsure how to take this medication, talk to your nurse or doctor. Original instructions: As directed What changed:   how much to take  how to take this  when to take this  additional instructions   warfarin 3 MG tablet Commonly known as: COUMADIN Take as directed. If you are unsure how to take this medication, talk to your nurse or doctor. Original instructions: Take 1 tablet on Mon Wed and Fri or as directed by anticoagulation clinic What changed: Another medication with the same name was changed. Make sure you understand how and when to take each.            Durable Medical Equipment  (From admission, onward)         Start     Ordered   03/21/20 1151  For home use only DME oxygen  Once       Question Answer Comment  Length of Need 6 Months   Mode or (Route) Nasal  cannula   Liters per Minute 3   Frequency Continuous (stationary and portable oxygen unit needed)   Oxygen conserving device Yes   Oxygen delivery system Gas      03/21/20 1153          Follow-up Information    [name], [name], MD. Schedule an appointment as soon as possible for a visit in 1 week(s).   Specialty: Internal Medicine Contact information: [place] [address] [phone]        Buford Dresser, MD Follow up in 1 month(s).   Specialty: Cardiology Contact information: [address] [place] [address] [phone]              Allergies  Allergen Reactions  . Cat Hair Extract Itching  . Diltiazem Anaphylaxis  . Sulfa Antibiotics Hives and Swelling  . Anoro Ellipta [Umeclidinium-Vilanterol]     Too much mucus  . Breo Ellipta [Fluticasone Furoate-Vilanterol]     Too much mucus  . Exalgo [Hydromorphone Hcl] Nausea Only    Sick and nauseated  . Fentanyl Other (See Comments)    REACTION: too sleepy  . Gabapentin   . Hydrocodone-Acetaminophen Nausea Only    REACTION: nausea  . Lopressor [Metoprolol Tartrate]     Asthmatic issues   . Penicillins Hives  . Sulfonamide Derivatives Hives and Swelling  . Tylenol [Acetaminophen]     Liver issues      Other Procedures/Studies: DG Chest Port 1 View  Result Date: 03/18/2020 CLINICAL DATA:  Worsening shortness of breath.  COVID positive. EXAM: PORTABLE CHEST 1 VIEW COMPARISON:  03/31/2013 FINDINGS: Cardiac enlargement.  Patchy peripheral and basilar infiltration with peribronchial thickening compatible with multifocal pneumonia. No pleural effusions. No pneumothorax. Mediastinal contours appear intact. IMPRESSION: Cardiac enlargement. Patchy bilateral pulmonary infiltrates compatible with multifocal pneumonia. Electronically Signed   By: Lucienne Capers M.D.   On: 03/18/2020 00:12     TODAY-DAY OF DISCHARGE:  Subjective:   [name] today has no  headache,no chest abdominal pain,no new weakness tingling or numbness, feels much better wants to go home today.   Objective:   Blood pressure 112/74, pulse 89, temperature 97.6 F (36.4 C), temperature source Oral, resp. rate (!) 24, height 5\' 11"  (1.803 m), weight (!) 147.3 kg, SpO2 93 %.  Intake/Output Summary (Last 24 hours) at 03/22/2020 1328 Last data filed at 03/22/2020 0939 Gross per 24 hour  Intake 720 ml  Output 875 ml  Net -155 ml   Filed Weights   03/17/20 2300 03/18/20 0415  Weight: (!) 144.2 kg (!) 147.3 kg    Exam: Awake Alert, Oriented *3, No new F.N deficits, Normal affect [place].AT,PERRAL Supple Neck,No JVD, No cervical lymphadenopathy appriciated.  Symmetrical Chest wall movement, Good air movement bilaterally, CTAB RRR,No Gallops,Rubs or new Murmurs, No Parasternal Heave +ve B.Sounds, Abd Soft, Non tender, No organomegaly appriciated, No rebound -guarding or rigidity. No Cyanosis, Clubbing or edema, No new Rash or bruise   PERTINENT RADIOLOGIC STUDIES: DG Chest Port 1 View  Result Date: 03/18/2020 CLINICAL DATA:  Worsening shortness of breath.  COVID positive. EXAM: PORTABLE CHEST 1 VIEW COMPARISON:  03/31/2013 FINDINGS: Cardiac enlargement. Patchy peripheral and basilar infiltration with peribronchial thickening compatible with multifocal pneumonia. No pleural effusions. No pneumothorax. Mediastinal contours appear intact. IMPRESSION: Cardiac enlargement. Patchy bilateral pulmonary infiltrates compatible with multifocal pneumonia. Electronically Signed   By: Lucienne Capers M.D.   On: 03/18/2020 00:12     PERTINENT LAB RESULTS: CBC: Recent Labs    03/21/20 0909 03/22/20 1118  WBC 10.7* 11.4*  HGB 15.5 15.9  HCT 44.8 46.8  PLT 243 245   CMET CMP     Component Value Date/Time   NA 136 03/22/2020 1118   NA 138 10/28/2018 1551   K 3.6 03/22/2020 1118   CL 98 03/22/2020 1118   CO2 25 03/22/2020 1118   GLUCOSE 223 (H) 03/22/2020 1118   GLUCOSE 108  (H) 04/30/2006 0841   BUN 28 (H) 03/22/2020 1118   BUN 15 10/28/2018 1551   CREATININE 1.14 03/22/2020 1118   CALCIUM 8.5 (L) 03/22/2020 1118   PROT 6.7 03/22/2020 1118   ALBUMIN 2.9 (L) 03/22/2020 1118   AST 137 (H) 03/22/2020 1118   ALT 224 (H) 03/22/2020 1118   ALKPHOS 43 03/22/2020 1118   BILITOT 1.1 03/22/2020 1118   GFRNONAA >60 03/22/2020 1118   GFRAA >60 03/22/2020 1118    GFR Estimated Creatinine Clearance: 91.3 mL/min (by C-G formula based on SCr of 1.14 mg/dL). No results for input(s): LIPASE, AMYLASE in the last 72 hours. No results for input(s): CKTOTAL, CKMB, CKMBINDEX, TROPONINI in the last 72 hours. Invalid input(s): POCBNP Recent Labs    03/21/20 0909 03/22/20 1118  DDIMER <0.27 0.63*   No results for input(s): HGBA1C in the last 72 hours. No results for input(s): CHOL, HDL, LDLCALC, TRIG, CHOLHDL, LDLDIRECT in the last 72 hours. No results for input(s): TSH, T4TOTAL, T3FREE, THYROIDAB in the last 72 hours.  Invalid input(s): FREET3 Recent Labs    03/20/20 0615 03/21/20 0909  FERRITIN 1,014* 1,224*   Coags: Recent Labs    03/21/20 0909 03/22/20  1118  INR 2.6* 2.7*   Microbiology: Recent Results (from the past 240 hour(s))  Blood Culture (routine x 2)     Status: None (Preliminary result)   Collection Time: 03/18/20 12:11 AM   Specimen: BLOOD  Result Value Ref Range Status   Specimen Description BLOOD RIGHT ARM  Final   Special Requests   Final    BOTTLES DRAWN AEROBIC AND ANAEROBIC Blood Culture adequate volume   Culture   Final    NO GROWTH 4 DAYS Performed at [facility], [address], [place], [place] [address]    Report Status PENDING  Incomplete  Blood Culture (routine x 2)     Status: None (Preliminary result)   Collection Time: 03/18/20 12:11 AM   Specimen: BLOOD  Result Value Ref Range Status   Specimen Description BLOOD LEFT HAND  Final   Special Requests   Final    BOTTLES DRAWN AEROBIC AND ANAEROBIC Blood Culture  results may not be optimal due to an inadequate volume of blood received in culture bottles   Culture   Final    NO GROWTH 4 DAYS Performed at [facility], [place] [address] [place], [place] [address]    Report Status PENDING  Incomplete  Respiratory Panel by RT PCR (Flu A&B, Covid) - Nasopharyngeal Swab     Status: Abnormal   Collection Time: 03/18/20 12:11 AM   Specimen: Nasopharyngeal Swab  Result Value Ref Range Status   SARS Coronavirus 2 by RT PCR POSITIVE (A) NEGATIVE Final    Comment: RESULT CALLED TO, READ BACK BY AND VERIFIED WITH: L. VENEGAS,RN [id number] 03/18/2020 T. TYSOR (NOTE) SARS-CoV-2 target nucleic acids are DETECTED.  SARS-CoV-2 RNA is generally detectable in upper respiratory specimens  during the acute phase of infection. Positive results are indicative of the presence of the identified virus, but do not rule out bacterial infection or co-infection with other pathogens not detected by the test. Clinical correlation with patient history and other diagnostic information is necessary to determine patient infection status. The expected result is Negative.  Fact Sheet for Patients:  PinkCheek.be  Fact Sheet for Healthcare Providers: GravelBags.it  This test is not yet approved or cleared by the [place] FDA and  has been authorized for detection and/or diagnosis of SARS-CoV-2 by FDA under an Emergency Use Authorization (EUA).  This EUA will remain in effect (meaning this test can  be used) for the duration of  the COVID-19 declaration under Section 564(b)(1) of the Act, 21 U.S.C. section 360bbb-3(b)(1), unless the authorization is terminated or revoked sooner.      Influenza A by PCR NEGATIVE NEGATIVE Final   Influenza B by PCR NEGATIVE NEGATIVE Final    Comment: (NOTE) The Xpert Xpress SARS-CoV-2/FLU/RSV assay is intended as an aid in  the diagnosis of influenza from Nasopharyngeal swab  specimens and  should not be used as a sole basis for treatment. Nasal washings and  aspirates are unacceptable for Xpert Xpress SARS-CoV-2/FLU/RSV  testing.  Fact Sheet for Patients: PinkCheek.be  Fact Sheet for Healthcare Providers: GravelBags.it  This test is not yet approved or cleared by the [place] FDA and  has been authorized for detection and/or diagnosis of SARS-CoV-2 by  FDA under an Emergency Use Authorization (EUA). This EUA will remain  in effect (meaning this test can be used) for the duration of the  Covid-19 declaration under Section 564(b)(1) of the Act, 21  U.S.C. section 360bbb-3(b)(1), unless the authorization is  terminated  or revoked. Performed at [facility], [place] [address] [place], [place] [address]   Urine culture     Status: Abnormal   Collection Time: 03/18/20  2:32 AM   Specimen: In/Out Cath Urine  Result Value Ref Range Status   Specimen Description IN/OUT CATH URINE  Final   Special Requests   Final    NONE Performed at [facility], [place] [address] [place], [place] [address]    Culture MULTIPLE SPECIES PRESENT, SUGGEST RECOLLECTION (A)  Final   Report Status 03/19/2020 FINAL  Final    FURTHER DISCHARGE INSTRUCTIONS:  Get Medicines reviewed and adjusted: Please take all your medications with you for your next visit with your Primary MD  Laboratory/radiological data: Please request your Primary MD to go over all hospital tests and procedure/radiological results at the follow up, please ask your Primary MD to get all Hospital records sent to his/her office.  In some cases, they will be blood work, cultures and biopsy results pending at the time of your discharge. Please request that your primary care M.D. goes through all the records of your hospital data and follows up on these results.  Also Note the following: If you experience worsening of your admission symptoms,  develop shortness of breath, life threatening emergency, suicidal or homicidal thoughts you must seek medical attention immediately by calling 911 or calling your MD immediately  if symptoms less severe.  You must read complete instructions/literature along with all the possible adverse reactions/side effects for all the Medicines you take and that have been prescribed to you. Take any new Medicines after you have completely understood and accpet all the possible adverse reactions/side effects.   Do not drive when taking Pain medications or sleeping medications (Benzodaizepines)  Do not take more than prescribed Pain, Sleep and Anxiety Medications. It is not advisable to combine anxiety,sleep and pain medications without talking with your primary care practitioner  Special Instructions: If you have smoked or chewed Tobacco  in the last 2 yrs please stop smoking, stop any regular Alcohol  and or any Recreational drug use.  Wear Seat belts while driving.  Please note: You were cared for by a hospitalist during your hospital stay. Once you are discharged, your primary care physician will handle any further medical issues. Please note that NO REFILLS for any discharge medications will be authorized once you are discharged, as it is imperative that you return to your primary care physician (or establish a relationship with a primary care physician if you do not have one) for your post hospital discharge needs so that they can reassess your need for medications and monitor your lab values.  Total Time spent coordinating discharge including counseling, education and face to face time equals 35 minutes.  Signed: Sallye Lunz 03/22/2020 1:28 PM

## 2020-03-22 NOTE — Consult Note (Signed)
   [facility] Inpatient Consult   03/22/2020  Robert [name] 17-Jan-1951 [id number]   [facility] [AC[facility]] Patient:  Medicare [employer] and [name], Robert Lacks, MD is primary care provider for transition of care follow up at [facility].  Patient screened for transition from hospital and for  post hospital follow up for potential [facility] service needs.  Review of patient's medical record reveals with [facility] RNCM assessment notes patient is for home with home health and with oxygen.  Plan:  Continue to follow progress and disposition to assess for post hospital care management needs.    Please place a [facility] Care Management consult as appropriate and for questions contact:   Robert Brood, RN BSN [facility] Liaison   Toll free office [phone]  Fax number: [phone] [name]

## 2020-03-22 NOTE — Evaluation (Signed)
Occupational Therapy Evaluation Patient Details Name: Robert [name] MRN: [id number] DOB: 1950-11-05 Today's Date: 03/22/2020    History of Present Illness Pt is a 69 y.o. male dx with COVID-19 on 03/10/20, now admitted 03/17/20 with worsening SOB, weakness, and intermittenc onfusion. Workup for acute hypoxic respiratory failure secondary to COVID-19 PNA. PMH includes obesity, asthma, PE, HTN; pt unvaccinated.   Clinical Impression   PTA, pt lives with family and reports Independence in all daily tasks without AD. Pt presents now with deficits in cardiopulmonary tolerance. Educated on energy conservation strategies for ADLs/mobility at home with pt verbalizing understanding. Pt requires no more than Supervision for LB ADLs and mobility to ensure safety and monitor vitals response with 2/4 DOE noted with activity. On 3 L O2, pt desats to 85% standing at sink for ADLs, 88% during hallway mobility but quickly recovers and sustains 90% on 3 L O2. No further skilled OT services indicated at this time. Anticipate pt to progress back to baseline quickly and has all needed DME at home.    Follow Up Recommendations  No OT follow up    Equipment Recommendations  None recommended by OT (has all needed DME)    Recommendations for Other Services       Precautions / Restrictions Precautions Precautions: Other (comment) Precaution Comments: Watch SpO2 and HR Restrictions Weight Bearing Restrictions: No      Mobility Bed Mobility               General bed mobility comments: Received sitting in recliner  Transfers Overall transfer level: Needs assistance Equipment used: None Transfers: Sit to/from Stand;Stand Pivot Transfers Sit to Stand: Supervision Stand pivot transfers: Supervision       General transfer comment: Supervision, no assist needed    Balance Overall balance assessment: Needs assistance Sitting-balance support: Feet supported Sitting balance-Leahy Scale: Good      Standing balance support: No upper extremity supported;During functional activity Standing balance-Leahy Scale: Good                             ADL either performed or assessed with clinical judgement   ADL Overall ADL's : Needs assistance/impaired Eating/Feeding: Independent;Sitting   Grooming: Set up;Standing;Oral care Grooming Details (indicate cue type and reason): setup for brushing teeth at sink, variable SpO2 readings during activity on 3 L O2, 2/4 DOE Upper Body Bathing: Independent;Sitting   Lower Body Bathing: Sit to/from stand;Set up   Upper Body Dressing : Independent;Sitting   Lower Body Dressing: Sit to/from stand;Supervision/safety   Toilet Transfer: Supervision/safety;Ambulation Toilet Transfer Details (indicate cue type and reason): simulated in room and hallway, no LOB, slow pace and slightly unsteady as pt fatigued Toileting- Clothing Manipulation and Hygiene: Sit to/from stand;Set up       Functional mobility during ADLs: Supervision/safety General ADL Comments: Pt with decreased cardiopulmonary tolerance (use of 3 L O2 during activity), hx of back issues with LE weakness and difficulty reaching to feet and around back at times.      Vision Baseline Vision/History: No visual deficits Vision Assessment?: No apparent visual deficits     Perception     Praxis      Pertinent Vitals/Pain Pain Assessment: No/denies pain     Hand Dominance Right   Extremity/Trunk Assessment Upper Extremity Assessment Upper Extremity Assessment: Overall WFL for tasks assessed   Lower Extremity Assessment Lower Extremity Assessment: Defer to PT evaluation   Cervical / Trunk Assessment Cervical /  Trunk Assessment: Normal   Communication Communication Communication: No difficulties   Cognition Arousal/Alertness: Awake/alert Behavior During Therapy: WFL for tasks assessed/performed Overall Cognitive Status: Within Functional Limits for tasks assessed                                      General Comments  Pt received on 2 L O2 at rest in recliner. On initial sit to stand on 2 L O2, desats to 84%. Increased to 3 L O2 for activity with desats standing at sink to mid-upper 80s. Lowest SpO2 mobilizing in hallway was 88%, sustained at 90-91%. Educated extensively on energy conservation strategies to implemement at home to maximize independence and decrease fatigue/SOB    Exercises     Shoulder Instructions      Home Living Family/patient expects to be discharged to:: Private residence Living Arrangements: Spouse/significant other;Children Available Help at Discharge: Family;Available 24 hours/day Type of Home: House Home Access: Stairs to enter [employer] of Steps: 4-6 Entrance Stairs-Rails: Right;Left Home Layout: One level     Bathroom Shower/Tub: Teacher, early years/pre: Handicapped height     Home Equipment: Clinical cytogeneticist - 2 wheels;Cane - single point;Wheelchair - manual;Transport chair;Hospital bed          Prior Functioning/Environment Level of Independence: Independent        Comments: Up to walking 3/4 mile with walking sticks due to h/o back issues with BLE numbness/weakness        OT Problem List: Decreased activity tolerance;Cardiopulmonary status limiting activity      OT Treatment/Interventions:      OT Goals(Current goals can be found in the care plan section) Acute Rehab OT Goals Patient Stated Goal: Return home "where I can move more"  OT Frequency:     Barriers to D/C:            Co-evaluation              AM-PAC OT "6 Clicks" Daily Activity     Outcome Measure Help from another person eating meals?: None Help from another person taking care of personal grooming?: A Little Help from another person toileting, which includes using toliet, bedpan, or urinal?: A Little Help from another person bathing (including washing, rinsing, drying)?:  None Help from another person to put on and taking off regular upper body clothing?: None Help from another person to put on and taking off regular lower body clothing?: A Little 6 Click Score: 21   End of Session Equipment Utilized During Treatment: Gait belt;Oxygen Nurse Communication: Mobility status  Activity Tolerance: Patient tolerated treatment well Patient left: in chair;with call bell/phone within reach  OT Visit Diagnosis: Other (comment) (decreased cardiopulmonary tolerance)                Time: 6967-8938 OT Time Calculation (min): 38 min Charges:  OT General Charges $OT Visit: 1 Visit OT Evaluation $OT Eval Moderate Complexity: 1 Mod OT Treatments $Self Care/Home Management : 8-22 mins $Therapeutic Activity: 8-22 mins  Layla Maw, OTR/L  Layla Maw 03/22/2020, 2:30 PM

## 2020-03-23 ENCOUNTER — Encounter (HOSPITAL_COMMUNITY): Payer: Self-pay | Admitting: *Deleted

## 2020-03-23 ENCOUNTER — Inpatient Hospital Stay (HOSPITAL_COMMUNITY)
Admission: EM | Admit: 2020-03-23 | Discharge: 2020-04-04 | DRG: 177 | Disposition: A | Discharge status: Home Health | Payer: MEDICARE | Attending: Internal Medicine | Admitting: Internal Medicine

## 2020-03-23 ENCOUNTER — Other Ambulatory Visit: Payer: Self-pay

## 2020-03-23 ENCOUNTER — Inpatient Hospital Stay (HOSPITAL_COMMUNITY): Payer: MEDICARE

## 2020-03-23 ENCOUNTER — Emergency Department (HOSPITAL_COMMUNITY): Payer: MEDICARE

## 2020-03-23 DIAGNOSIS — I10 Essential (primary) hypertension: Secondary | ICD-10-CM | POA: Diagnosis not present

## 2020-03-23 DIAGNOSIS — I4819 Other persistent atrial fibrillation: Secondary | ICD-10-CM | POA: Diagnosis not present

## 2020-03-23 DIAGNOSIS — I480 Paroxysmal atrial fibrillation: Secondary | ICD-10-CM | POA: Diagnosis not present

## 2020-03-23 DIAGNOSIS — I350 Nonrheumatic aortic (valve) stenosis: Secondary | ICD-10-CM | POA: Diagnosis not present

## 2020-03-23 DIAGNOSIS — M5430 Sciatica, unspecified side: Secondary | ICD-10-CM | POA: Diagnosis present

## 2020-03-23 DIAGNOSIS — Z6841 Body Mass Index (BMI) 40.0 and over, adult: Secondary | ICD-10-CM | POA: Diagnosis not present

## 2020-03-23 DIAGNOSIS — Z981 Arthrodesis status: Secondary | ICD-10-CM

## 2020-03-23 DIAGNOSIS — J45909 Unspecified asthma, uncomplicated: Secondary | ICD-10-CM | POA: Diagnosis not present

## 2020-03-23 DIAGNOSIS — E538 Deficiency of other specified B group vitamins: Secondary | ICD-10-CM | POA: Diagnosis present

## 2020-03-23 DIAGNOSIS — I739 Peripheral vascular disease, unspecified: Secondary | ICD-10-CM | POA: Diagnosis present

## 2020-03-23 DIAGNOSIS — I428 Other cardiomyopathies: Secondary | ICD-10-CM | POA: Diagnosis not present

## 2020-03-23 DIAGNOSIS — Z7952 Long term (current) use of systemic steroids: Secondary | ICD-10-CM

## 2020-03-23 DIAGNOSIS — M7989 Other specified soft tissue disorders: Secondary | ICD-10-CM | POA: Diagnosis not present

## 2020-03-23 DIAGNOSIS — M1990 Unspecified osteoarthritis, unspecified site: Secondary | ICD-10-CM | POA: Diagnosis present

## 2020-03-23 DIAGNOSIS — E785 Hyperlipidemia, unspecified: Secondary | ICD-10-CM | POA: Diagnosis present

## 2020-03-23 DIAGNOSIS — Z888 Allergy status to other drugs, medicaments and biological substances status: Secondary | ICD-10-CM

## 2020-03-23 DIAGNOSIS — R918 Other nonspecific abnormal finding of lung field: Secondary | ICD-10-CM | POA: Diagnosis not present

## 2020-03-23 DIAGNOSIS — R7989 Other specified abnormal findings of blood chemistry: Secondary | ICD-10-CM | POA: Diagnosis not present

## 2020-03-23 DIAGNOSIS — Z8249 Family history of ischemic heart disease and other diseases of the circulatory system: Secondary | ICD-10-CM

## 2020-03-23 DIAGNOSIS — Z79899 Other long term (current) drug therapy: Secondary | ICD-10-CM

## 2020-03-23 DIAGNOSIS — U071 COVID-19: Principal | ICD-10-CM

## 2020-03-23 DIAGNOSIS — F418 Other specified anxiety disorders: Secondary | ICD-10-CM | POA: Diagnosis present

## 2020-03-23 DIAGNOSIS — Z882 Allergy status to sulfonamides status: Secondary | ICD-10-CM

## 2020-03-23 DIAGNOSIS — Z87892 Personal history of anaphylaxis: Secondary | ICD-10-CM

## 2020-03-23 DIAGNOSIS — Z86718 Personal history of other venous thrombosis and embolism: Secondary | ICD-10-CM | POA: Diagnosis not present

## 2020-03-23 DIAGNOSIS — R0602 Shortness of breath: Secondary | ICD-10-CM | POA: Diagnosis not present

## 2020-03-23 DIAGNOSIS — Z7901 Long term (current) use of anticoagulants: Secondary | ICD-10-CM | POA: Diagnosis not present

## 2020-03-23 DIAGNOSIS — J984 Other disorders of lung: Secondary | ICD-10-CM | POA: Diagnosis not present

## 2020-03-23 DIAGNOSIS — J9601 Acute respiratory failure with hypoxia: Secondary | ICD-10-CM | POA: Diagnosis not present

## 2020-03-23 DIAGNOSIS — Z86711 Personal history of pulmonary embolism: Secondary | ICD-10-CM | POA: Diagnosis not present

## 2020-03-23 DIAGNOSIS — Z886 Allergy status to analgesic agent status: Secondary | ICD-10-CM

## 2020-03-23 DIAGNOSIS — Z880 Allergy status to penicillin: Secondary | ICD-10-CM

## 2020-03-23 DIAGNOSIS — M4310 Spondylolisthesis, site unspecified: Secondary | ICD-10-CM | POA: Diagnosis present

## 2020-03-23 DIAGNOSIS — R7401 Elevation of levels of liver transaminase levels: Secondary | ICD-10-CM | POA: Diagnosis not present

## 2020-03-23 DIAGNOSIS — R000 Tachycardia, unspecified: Secondary | ICD-10-CM | POA: Diagnosis not present

## 2020-03-23 DIAGNOSIS — I825Y3 Chronic embolism and thrombosis of unspecified deep veins of proximal lower extremity, bilateral: Secondary | ICD-10-CM | POA: Diagnosis not present

## 2020-03-23 DIAGNOSIS — Z8349 Family history of other endocrine, nutritional and metabolic diseases: Secondary | ICD-10-CM

## 2020-03-23 DIAGNOSIS — R040 Epistaxis: Secondary | ICD-10-CM | POA: Diagnosis not present

## 2020-03-23 DIAGNOSIS — I82409 Acute embolism and thrombosis of unspecified deep veins of unspecified lower extremity: Secondary | ICD-10-CM | POA: Diagnosis present

## 2020-03-23 DIAGNOSIS — Z91048 Other nonmedicinal substance allergy status: Secondary | ICD-10-CM

## 2020-03-23 DIAGNOSIS — J1282 Pneumonia due to coronavirus disease 2019: Secondary | ICD-10-CM | POA: Diagnosis present

## 2020-03-23 DIAGNOSIS — Z885 Allergy status to narcotic agent status: Secondary | ICD-10-CM

## 2020-03-23 LAB — CBC WITH DIFFERENTIAL/PLATELET
Abs Immature Granulocytes: 0.59 10*3/uL — ABNORMAL HIGH (ref 0.00–0.07)
Basophils Absolute: 0.1 10*3/uL (ref 0.0–0.1)
Basophils Relative: 0 %
Eosinophils Absolute: 0 10*3/uL (ref 0.0–0.5)
Eosinophils Relative: 0 %
HCT: 45.5 % (ref 39.0–52.0)
Hemoglobin: 15.6 g/dL (ref 13.0–17.0)
Immature Granulocytes: 4 %
Lymphocytes Relative: 11 %
Lymphs Abs: 1.4 10*3/uL (ref 0.7–4.0)
MCH: 32 pg (ref 26.0–34.0)
MCHC: 34.3 g/dL (ref 30.0–36.0)
MCV: 93.4 fL (ref 80.0–100.0)
Monocytes Absolute: 0.8 10*3/uL (ref 0.1–1.0)
Monocytes Relative: 6 %
Neutro Abs: 10.4 10*3/uL — ABNORMAL HIGH (ref 1.7–7.7)
Neutrophils Relative %: 79 %
Platelets: 283 10*3/uL (ref 150–400)
RBC: 4.87 MIL/uL (ref 4.22–5.81)
RDW: 12.3 % (ref 11.5–15.5)
WBC: 13.3 10*3/uL — ABNORMAL HIGH (ref 4.0–10.5)
nRBC: 0.2 % (ref 0.0–0.2)

## 2020-03-23 LAB — TYPE AND SCREEN
ABO/RH(D): O POS
Antibody Screen: NEGATIVE

## 2020-03-23 LAB — I-STAT ARTERIAL BLOOD GAS, ED
Acid-Base Excess: 6 mmol/L — ABNORMAL HIGH (ref 0.0–2.0)
Bicarbonate: 31 mmol/L — ABNORMAL HIGH (ref 20.0–28.0)
Calcium, Ion: 1.12 mmol/L — ABNORMAL LOW (ref 1.15–1.40)
HCT: 42 % (ref 39.0–52.0)
Hemoglobin: 14.3 g/dL (ref 13.0–17.0)
O2 Saturation: 100 %
Potassium: 4.3 mmol/L (ref 3.5–5.1)
Sodium: 138 mmol/L (ref 135–145)
TCO2: 32 mmol/L (ref 22–32)
pCO2 arterial: 43.2 mmHg (ref 32.0–48.0)
pH, Arterial: 7.464 — ABNORMAL HIGH (ref 7.350–7.450)
pO2, Arterial: 161 mmHg — ABNORMAL HIGH (ref 83.0–108.0)

## 2020-03-23 LAB — COMPREHENSIVE METABOLIC PANEL
ALT: 203 U/L — ABNORMAL HIGH (ref 0–44)
AST: 101 U/L — ABNORMAL HIGH (ref 15–41)
Albumin: 2.8 g/dL — ABNORMAL LOW (ref 3.5–5.0)
Alkaline Phosphatase: 48 U/L (ref 38–126)
Anion gap: 12 (ref 5–15)
BUN: 24 mg/dL — ABNORMAL HIGH (ref 8–23)
CO2: 24 mmol/L (ref 22–32)
Calcium: 8.3 mg/dL — ABNORMAL LOW (ref 8.9–10.3)
Chloride: 102 mmol/L (ref 98–111)
Creatinine, Ser: 1.07 mg/dL (ref 0.61–1.24)
GFR calc Af Amer: >60 mL/min (ref >60)
GFR calc non Af Amer: >60 mL/min (ref >60)
Glucose, Bld: 97 mg/dL (ref 70–99)
Potassium: 3.6 mmol/L (ref 3.5–5.1)
Sodium: 138 mmol/L (ref 135–145)
Total Bilirubin: 1.7 mg/dL — ABNORMAL HIGH (ref 0.3–1.2)
Total Protein: 6.6 g/dL (ref 6.5–8.1)

## 2020-03-23 LAB — TROPONIN I (HIGH SENSITIVITY)
Troponin I (High Sensitivity): 41 ng/L — ABNORMAL HIGH (ref <18)
Troponin I (High Sensitivity): 41 ng/L — ABNORMAL HIGH (ref <18)

## 2020-03-23 LAB — FIBRINOGEN
Fibrinogen: 408 mg/dL (ref 210–475)
Fibrinogen: 481 mg/dL — ABNORMAL HIGH (ref 210–475)

## 2020-03-23 LAB — LACTIC ACID, PLASMA
Lactic Acid, Venous: 2.4 mmol/L (ref 0.5–1.9)
Lactic Acid, Venous: 2.6 mmol/L (ref 0.5–1.9)

## 2020-03-23 LAB — C-REACTIVE PROTEIN: CRP: 0.9 mg/dL (ref <1.0)

## 2020-03-23 LAB — LACTATE DEHYDROGENASE
LDH: 362 U/L — ABNORMAL HIGH (ref 98–192)
LDH: 359 U/L — ABNORMAL HIGH (ref 98–192)

## 2020-03-23 LAB — CBC
HCT: 45.6 % (ref 39.0–52.0)
Hemoglobin: 15.3 g/dL (ref 13.0–17.0)
MCH: 31.9 pg (ref 26.0–34.0)
MCHC: 33.6 g/dL (ref 30.0–36.0)
MCV: 95.2 fL (ref 80.0–100.0)
Platelets: 259 10*3/uL (ref 150–400)
RBC: 4.79 MIL/uL (ref 4.22–5.81)
RDW: 12.6 % (ref 11.5–15.5)
WBC: 12.5 10*3/uL — ABNORMAL HIGH (ref 4.0–10.5)
nRBC: 0.2 % (ref 0.0–0.2)

## 2020-03-23 LAB — CULTURE, BLOOD (ROUTINE X 2)
Culture: NO GROWTH
Culture: NO GROWTH
Special Requests: ADEQUATE

## 2020-03-23 LAB — PROCALCITONIN
Procalcitonin: <0.1 ng/mL
Procalcitonin: 0.11 ng/mL

## 2020-03-23 LAB — D-DIMER, QUANTITATIVE
D-Dimer, Quant: 1.75 ug/mL-FEU — ABNORMAL HIGH (ref 0.00–0.50)
D-Dimer, Quant: 2.74 ug/mL-FEU — ABNORMAL HIGH (ref 0.00–0.50)

## 2020-03-23 LAB — FERRITIN: Ferritin: 1502 ng/mL — ABNORMAL HIGH (ref 24–336)

## 2020-03-23 LAB — CREATININE, SERUM
Creatinine, Ser: 1.08 mg/dL (ref 0.61–1.24)
GFR calc Af Amer: >60 mL/min (ref >60)
GFR calc non Af Amer: >60 mL/min (ref >60)

## 2020-03-23 LAB — TRIGLYCERIDES: Triglycerides: 112 mg/dL (ref <150)

## 2020-03-23 MED ORDER — SODIUM CHLORIDE 0.9 % IV SOLN: INTRAVENOUS | Status: DC

## 2020-03-23 MED ORDER — HEPARIN SODIUM (PORCINE) 5000 UNIT/ML IJ SOLN: 5000.0000 [IU] | Freq: Three times a day (TID) | INTRAMUSCULAR | Status: DC

## 2020-03-23 MED ORDER — NORTRIPTYLINE HCL 10 MG PO CAPS
10.0000 mg | ORAL_CAPSULE | Freq: Every evening | ORAL | Status: DC
  Administered 2020-03-23 – 2020-03-25 (×3): 10 mg via ORAL
  Filled 2020-03-23 (×6): qty 1

## 2020-03-23 MED ORDER — BENZONATATE 100 MG PO CAPS: 100.0000 mg | ORAL_CAPSULE | Freq: Four times a day (QID) | ORAL | Status: DC | PRN

## 2020-03-23 MED ORDER — METHYLPREDNISOLONE SODIUM SUCC 125 MG IJ SOLR
40.0000 mg | Freq: Once | INTRAMUSCULAR | Status: AC
  Administered 2020-03-23: 40 mg via INTRAVENOUS
  Filled 2020-03-23: qty 2

## 2020-03-23 MED ORDER — IOHEXOL 350 MG/ML SOLN
100.0000 mL | Freq: Once | INTRAVENOUS | Status: AC | PRN
  Administered 2020-03-23: 100 mL via INTRAVENOUS

## 2020-03-23 MED ORDER — BARICITINIB 2 MG PO TABS
4.0000 mg | ORAL_TABLET | Freq: Every day | ORAL | Status: AC
  Administered 2020-03-23 – 2020-03-31 (×9): 4 mg via ORAL
  Filled 2020-03-23 (×10): qty 2

## 2020-03-23 MED ORDER — ALBUTEROL SULFATE HFA 108 (90 BASE) MCG/ACT IN AERS: 2.0000 | INHALATION_SPRAY | Freq: Four times a day (QID) | RESPIRATORY_TRACT | Status: DC | PRN

## 2020-03-23 MED ORDER — METHYLPREDNISOLONE SODIUM SUCC 40 MG IJ SOLR
40.0000 mg | Freq: Two times a day (BID) | INTRAMUSCULAR | Status: DC
  Administered 2020-03-23 – 2020-03-24 (×2): 40 mg via INTRAVENOUS
  Filled 2020-03-23 (×2): qty 1

## 2020-03-23 MED ORDER — ALBUTEROL SULFATE HFA 108 (90 BASE) MCG/ACT IN AERS
2.0000 | INHALATION_SPRAY | Freq: Once | RESPIRATORY_TRACT | Status: AC
  Administered 2020-03-23: 2 via RESPIRATORY_TRACT
  Filled 2020-03-23: qty 6.7

## 2020-03-23 MED ORDER — AEROCHAMBER PLUS FLO-VU LARGE MISC
Status: AC
  Filled 2020-03-23: qty 1

## 2020-03-23 MED ORDER — SODIUM CHLORIDE 0.9% FLUSH
3.0000 mL | Freq: Two times a day (BID) | INTRAVENOUS | Status: DC
  Administered 2020-03-24 – 2020-04-04 (×25): 3 mL via INTRAVENOUS

## 2020-03-23 MED ORDER — ALBUTEROL SULFATE (2.5 MG/3ML) 0.083% IN NEBU: 2.5000 mg | INHALATION_SOLUTION | RESPIRATORY_TRACT | Status: DC | PRN

## 2020-03-23 NOTE — H&P (Signed)
History and Physical    Robert [name]:[id number] DOB: April 24, 1951 DOA: 03/23/2020  PCP: Cassandria Anger, MD    Patient coming from:  Home  Chief Complaint:  SOB   HPI: Robert [name] is a 69 y.o. male with medical history significant of recent Covid-19 PNA was discharged yesterday with home oxygen.  Pt comes back today with worsening SOB and is on 8L of HHFNC. Pt is somnolent. And Has told RT that he does not want to be on ventilator.  HPI is limited as patient is very somnolent.  Patient started on Solu-Medrol and baricitinib.  ED Course:  Pt is somnolent and wife confirmed DNR status. She wants to see husband as he is not doing well. Labs show elevated D-dimer and ferritin and LDH, with a normal prolactin. Blood pressure 120/84, pulse 94, temperature 99.5 F (37.5 C), temperature source Oral, resp. rate (!) 29, SpO2 97 %. SpO2: 97 % O2 Flow Rate (L/min): 15 L/min COVID-19 Labs  Recent Labs    03/21/20 0909 03/21/20 0909 03/22/20 1118 03/23/20 0703 03/23/20 0900 03/23/20 1647  DDIMER <0.27   < > 0.63*  --  1.75* 2.74*  FERRITIN 1,224*  --   --  1,502*  --   --   LDH  --   --   --  362*  --  359*  CRP 0.8  --  0.7 0.9  --   --    < > = values in this interval not displayed.    Lab Results  Component Value Date   Ferney (A) 03/18/2020   Review of Systems: As per HPI otherwise all systems reviewed and negative.  Past Medical History:  Diagnosis Date  . Anxiety   . Asthma    "no attack since acupuncture in 1990's"  . Complication of anesthesia    SLOW TO WAKE UP / DIFFICULTY RESPONDING 2003, 3 days before could use left arm, "wild/crazy sometimes"  . Cyst of left kidney    "benign"  . DVT (deep venous thrombosis) ([place]) 2003   right femoral artery "from groin to knee"  . Edema    Right Leg w/ h/o DVT postphlebitic  . H/O blood transfusion reaction 2004   FFP, extreme swelling, could not breath  . Headache(784.0)    MIGRAINES-not  for a long time  . History of pulmonary embolism 2003   both lungs  . Hyperlipidemia   . Knee pain    left  . LBP (low back pain)   . 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 Obesity   . Osteoarthritis   . Peripheral vascular disease ([place])    "poor circulation in  RT leg"  . PONV (postoperative nausea and vomiting)    during surgery in 1990's  . Spondylolisthesis   . Warfarin anticoagulation   . Wheezing    when laying on left side    Past Surgical History:  Procedure Laterality Date  . APPENDECTOMY  1993   Dr Margot Chimes  . CHOLECYSTECTOMY  1993  . FINGER SURGERY  2012   RT THUMB RECONSTRUCTION  . HAND SURGERY Right    abcess  . HEMORROIDECTOMY  1981  . I & D KNEE WITH POLY EXCHANGE Left 04/04/2013   Procedure: IRRIGATION AND DEBRIDEMENT KNEE WITH POLY EXCHANGE AND INSERTION OF CEMENT BEADS;  Surgeon: Gearlean Alf, MD;  Location: [facility];  Service: Orthopedics;  Laterality: Left;  . KNEE ARTHROSCOPY  1990  RT KNEE  . KNEE ARTHROSCOPY Right 2003  . KNEE ARTHROSCOPY  1988   L KNEE  . KNEE ARTHROSCOPY WITH MENISCAL REPAIR Left 10/22/2012   Procedure: LEFT KNEE ARTHROSCOPY PARTIAL MEDIAL AND LATERAL MENISCECTOMY AND DEBRIDEMENT, CHONDROPLASTY ;  Surgeon: Johnn Hai, MD;  Location: [facility];  Service: Orthopedics;  Laterality: Left;  . LUMBAR DISC SURGERY    . LUMBAR FUSION  2003   Dr Maxie Better  . ORCHIECTOMY  1994   R post injury  . TONSILLECTOMY  1967  . TOTAL KNEE ARTHROPLASTY Left 03/21/2013   Procedure: LEFT TOTAL KNEE ARTHROPLASTY;  Surgeon: Gearlean Alf, MD;  Location: [facility];  Service: Orthopedics;  Laterality: Left;     reports that he has never smoked. He has never used smokeless tobacco. He reports that he does not drink alcohol and does not use drugs.  Allergies  Allergen Reactions  . Cat Hair Extract Itching  . Diltiazem Anaphylaxis  . Sulfa Antibiotics Hives and Swelling  . Anoro Ellipta [Umeclidinium-Vilanterol]     Too much mucus  .  Breo Ellipta [Fluticasone Furoate-Vilanterol]     Too much mucus  . Exalgo [Hydromorphone Hcl] Nausea Only    Sick and nauseated  . Fentanyl Other (See Comments)    REACTION: too sleepy  . Gabapentin   . Hydrocodone-Acetaminophen Nausea Only    REACTION: nausea  . Lopressor [Metoprolol Tartrate]     Asthmatic issues   . Penicillins Hives  . Sulfonamide Derivatives Hives and Swelling  . Tylenol [Acetaminophen]     Liver issues    Family History  Problem Relation Age of Onset  . Heart disease Mother        CABG at age 69  . Hyperlipidemia Other   . Hypertension Other   . Parkinsonism Other   . Heart disease Sister     Prior to Admission medications   Medication Sig Start Date End Date Taking? Authorizing Provider  albuterol (PROVENTIL) (2.5 MG/3ML) 0.083% nebulizer solution Take 3 mLs (2.5 mg total) by nebulization every 4 (four) hours as needed for wheezing or shortness of breath. Dx  D74.128 02/16/20   [name], [name], MD  albuterol (VENTOLIN HFA) 108 (90 Base) MCG/ACT inhaler Inhale 2 puffs into the lungs every 6 (six) hours as needed for wheezing or shortness of breath. 03/22/20   Ghimire, Henreitta Leber, MD  benzonatate (TESSALON PERLES) 100 MG capsule Take 1 capsule (100 mg total) by mouth every 6 (six) hours as needed for cough. 03/22/20 03/22/21  Ghimire, Henreitta Leber, MD  Cholecalciferol (VITAMIN D3) 125 MCG (5000 UT) CAPS Take 1 capsule (5,000 Units total) by mouth daily. 12/25/19   [name], [name], MD  fish oil-omega-3 fatty acids 1000 MG capsule Take 2 g by mouth every morning.    [provider]  fluticasone (FLONASE) 50 MCG/ACT nasal spray Place 2 sprays into both nostrils daily. Patient taking differently: Place 2 sprays into both nostrils daily as needed for allergies.  04/12/18   [name], [name], MD  furosemide (LASIX) 20 MG tablet Take 1 tablet (20 mg total) by mouth daily. 07/12/19   [name], [name], MD  glucosamine-chondroitin 500-400 MG tablet  Take 1 tablet by mouth every morning.    [provider]  nortriptyline (PAMELOR) 10 MG capsule Take 1 capsule (10 mg total) by mouth every evening. 07/12/19   [name], [name], MD  predniSONE (DELTASONE) 10 MG tablet 4 tablets (40 mg) daily for 2 days, 3 tablets (30  mg) daily for 2 days, 2 tablets (20 mg) daily for 2 days, 1 tablet (10 mg) daily for 2 days-and then resume 0.5 tablet (5 mg) daily 03/22/20   Ghimire, Henreitta Leber, MD  warfarin (COUMADIN) 3 MG tablet Take 1 tablet on Mon Wed and Fri or as directed by anticoagulation clinic 02/01/20   [name], [name], MD  warfarin (COUMADIN) 6 MG tablet As directed Patient taking differently: Take 6 mg by mouth See admin instructions. As directed; Lenna Sciara and [facility] 02/01/20   Cassandria Anger, MD    Physical Exam: Vitals:   03/23/20 1352 03/23/20 1400 03/23/20 1408 03/23/20 1500  BP:  131/85  120/84  Pulse: 89 90 86 94  Resp: (!) 28 (!) 32 (!) 23 (!) 29  Temp:      TempSrc:      SpO2: 92% 90% 93% 97%    Constitutional: NAD, calm, comfortable Vitals:   03/23/20 1352 03/23/20 1400 03/23/20 1408 03/23/20 1500  BP:  131/85  120/84  Pulse: 89 90 86 94  Resp: (!) 28 (!) 32 (!) 23 (!) 29  Temp:      TempSrc:      SpO2: 92% 90% 93% 97%   Eyes: PERRL ENMT: Mucous membranes are moist. Posterior pharynx clear of any exudate or lesions.Normal dentition.  Neck: normal, supple, no masses, no thyromegaly, no carotid bruit  Respiratory: Bilateral wheezing posteriorly all throughout lung field.  Normal respiratory effort. No accessory muscle use.  Cardiovascular: Irregular rate and rhythm, no murmurs / rubs / gallops. No extremity edema. 2+ pedal pulses. No carotid bruits.  Abdomen: no tenderness, no masses palpated. No hepatosplenomegaly. Bowel sounds positive.  Musculoskeletal: no clubbing / cyanosis. No joint deformity upper and lower extremities. Pt moving all four ext , no contractures. Normal muscle tone.  Skin: no rashes,  lesions, ulcers. No induration Neurologic: CN 2-12 grossly intact. Psychiatric: Unable to assess patient is very somnolent.  Labs on Admission: I have personally reviewed following labs and imaging studies  CBC: Recent Labs  Lab 03/19/20 0500 03/19/20 0500 03/20/20 0615 03/21/20 0909 03/22/20 1118 03/23/20 0703 03/23/20 1647  WBC 6.5   < > 11.6* 10.7* 11.4* 13.3* 12.5*  NEUTROABS 4.7  --  9.3* 8.8* 9.7* 10.4*  --   HGB 15.4   < > 15.6 15.5 15.9 15.6 15.3  HCT 43.8   < > 44.9 44.8 46.8 45.5 45.6  MCV 94.4   < > 93.9 93.5 93.6 93.4 95.2  PLT 212   < > 247 243 245 283 259   < > = values in this interval not displayed.   Basic Metabolic Panel: Recent Labs  Lab 03/19/20 0500 03/19/20 0500 03/20/20 0615 03/21/20 0909 03/22/20 1118 03/23/20 0703 03/23/20 1647  NA 132*  --  133* 135 136 138  --   K 3.8  --  4.1 3.8 3.6 3.6  --   CL 100  --  98 99 98 102  --   CO2 22  --  22 24 25 24   --   GLUCOSE 160*  --  147* 178* 223* 97  --   BUN 19  --  23 30* 28* 24*  --   CREATININE 1.05   < > 0.94 1.13 1.14 1.07 1.08  CALCIUM 8.8*  --  8.8* 8.6* 8.5* 8.3*  --    < > = values in this interval not displayed.   GFR: Estimated Creatinine Clearance: 96.4 mL/min (by C-G formula based  on SCr of 1.08 mg/dL). Liver Function Tests: Recent Labs  Lab 03/19/20 0500 03/20/20 0615 03/21/20 0909 03/22/20 1118 03/23/20 0703  AST 42* 61* 149* 137* 101*  ALT 31 48* 155* 224* 203*  ALKPHOS 34* 37* 40 43 48  BILITOT 0.7 1.1 1.2 1.1 1.7*  PROT 6.6 6.7 6.7 6.7 6.6  ALBUMIN 2.8* 2.8* 2.7* 2.9* 2.8*   No results for input(s): LIPASE, AMYLASE in the last 168 hours. No results for input(s): AMMONIA in the last 168 hours. Coagulation Profile: Recent Labs  Lab 03/18/20 0011 03/19/20 0500 03/20/20 0615 03/21/20 0909 03/22/20 1118  INR 1.4* 1.4* 2.0* 2.6* 2.7*   Cardiac Enzymes: No results for input(s): CKTOTAL, CKMB, CKMBINDEX, TROPONINI in the last 168 hours. BNP (last 3 results) No  results for input(s): PROBNP in the last 8760 hours. HbA1C: No results for input(s): HGBA1C in the last 72 hours. CBG: No results for input(s): GLUCAP in the last 168 hours. Lipid Profile: Recent Labs    03/23/20 0703  TRIG 112   Thyroid Function Tests: No results for input(s): TSH, T4TOTAL, FREET4, T3FREE, THYROIDAB in the last 72 hours. Anemia Panel: Recent Labs    03/21/20 0909 03/23/20 0703  FERRITIN 1,224* 1,502*   Urine analysis:    Component Value Date/Time   COLORURINE YELLOW 03/18/2020 0231   APPEARANCEUR CLEAR 03/18/2020 0231   LABSPEC >1.030 (H) 03/18/2020 0231   PHURINE 6.0 03/18/2020 0231   GLUCOSEU NEGATIVE 03/18/2020 0231   GLUCOSEU NEGATIVE 03/30/2019 0924   HGBUR SMALL (A) 03/18/2020 0231   BILIRUBINUR NEGATIVE 03/18/2020 0231   KETONESUR NEGATIVE 03/18/2020 0231   PROTEINUR 100 (A) 03/18/2020 0231   UROBILINOGEN 0.2 03/30/2019 0924   NITRITE NEGATIVE 03/18/2020 0231   LEUKOCYTESUR NEGATIVE 03/18/2020 0231   No intake or output data in the 24 hours ending 03/23/20 1748 Lab Results  Component Value Date   CREATININE 1.08 03/23/2020   CREATININE 1.07 03/23/2020   CREATININE 1.14 03/22/2020    COVID-19 Labs  Recent Labs    03/21/20 0909 03/21/20 0909 03/22/20 1118 03/23/20 0703 03/23/20 0900 03/23/20 1647  DDIMER <0.27   < > 0.63*  --  1.75* 2.74*  FERRITIN 1,224*  --   --  1,502*  --   --   LDH  --   --   --  362*  --  359*  CRP 0.8  --  0.7 0.9  --   --    < > = values in this interval not displayed.    Lab Results  Component Value Date   SARSCOV2NAA POSITIVE (A) 03/18/2020    Radiological Exams on Admission: CT ANGIO CHEST PE W OR WO CONTRAST  Result Date: 03/23/2020 CLINICAL DATA:  Shortness of breath. COVID positive, recent hospitalization post discharge yesterday. EXAM: CT ANGIOGRAPHY CHEST WITH CONTRAST TECHNIQUE: Multidetector CT imaging of the chest was performed using the standard protocol during bolus administration of  intravenous contrast. Multiplanar CT image reconstructions and MIPs were obtained to evaluate the vascular anatomy. CONTRAST:  17mL OMNIPAQUE IOHEXOL 350 MG/ML SOLN COMPARISON:  Radiograph earlier today.  Chest CTA 07/04/2013 FINDINGS: Cardiovascular: There are no filling defects within the pulmonary arteries to suggest pulmonary embolus. There is significant breathing motion artifact, evaluation is diagnostic to the proximal segmental level. Cannot assess distal segmental and subsegmental branches. Thoracic aorta is normal in caliber. There is no aortic dissection. Moderate aortic atherosclerosis. Borderline cardiomegaly. There mitral annulus calcifications. Coronary artery calcifications. No pericardial effusion. Small amount of contrast refluxes into  the hepatic veins and IVC. Mediastinum/Nodes: Small hiatal hernia. Small paratracheal and right infrahilar nodes not enlarged by size criteria. No suspicious thyroid nodule. No pneumomediastinum. Lungs/Pleura: Multifocal patchy and geographic ground-glass opacities within all lobes of both lungs. Some of these opacities appear coalescing peripherally. No convincing septal thickening or pulmonary edema. No pneumothorax. Trachea and central bronchi are patent. No pleural fluid. The previous right lower lobe pulmonary nodule is obscured by opacities on the current exam. Upper Abdomen: Minimal contrast refluxing into the hepatic veins and IVC. Suspected hepatic steatosis. Cholecystectomy. Multiple cysts in the left kidney, incompletely characterized on this chest CTA, but grossly stable from 2015. Musculoskeletal: Multilevel degenerative change throughout the spine. There are no acute or suspicious osseous abnormalities. Review of the MIP images confirms the above findings.  IMPRESSION: 1. No pulmonary embolus allowing for significant breathing motion artifact. 2. Multifocal patchy and geographic ground-glass opacities within all lobes of both lungs, pattern consistent  with COVID-19 pneumonia. 3. Small amount of contrast refluxing into the hepatic veins and IVC, suggesting elevated right heart pressures. 4. Small hiatal hernia. 5. Aortic atherosclerosis and coronary artery calcifications. Aortic Atherosclerosis (ICD10-I70.0). Electronically Signed   By: Keith Rake M.D.   On: 03/23/2020 17:05   DG Chest Portable 1 View Result Date: 03/23/2020 CLINICAL DATA:  Shortness of breath EXAM: PORTABLE CHEST 1 VIEW COMPARISON:  03/17/2020 FINDINGS: Cardiomegaly. Bilateral indistinct airspace opacities that have progressed. No clear Kerley lines. No pleural effusion or pneumothorax. IMPRESSION: Progressive pulmonary infiltrates. Electronically Signed   By: Monte Fantasia M.D.   On: 03/23/2020 07:18    EKG: Independently reviewed. A Fib RVR at 118.  Assessment/Plan Principal Problem:   Acute hypoxemic respiratory failure due to COVID-19 [facility]) Active Problems:   DVT of leg (deep venous thrombosis) (HCC)   B12 deficiency   Essential hypertension   Acute hypoxemic respiratory failure due to covid-19 : -admit to PCU/Stepdown  -Supplemental oxygen to keep goa o2 sats at 90% ,. -continue baricitinib and solumedrol . -cont coumadin for his h/o PE  And DVT. -cont albuterol nebs.   HTN: -NPO. -2 d echo. -start home meds once stable. -  DVT prophylaxis:  Warfarin Continued.  Code Status:  DNR  Family Communication:  None at bedside Wife called at 5:30 today and d/w her about poor prognosis.  Disposition Plan:  To be determined  Consults called:  None  Admission status: Inpatient Status is: Inpatient  Remains inpatient appropriate because:Inpatient level of care appropriate due to severity of illness   Dispo: The patient is from: Home              Anticipated d/c is to: Home              Anticipated d/c date is: > 3 days              Patient currently is not medically stable to d/c.   Para Skeans MD [facility] Pager  [phone] If 7PM-7AM, please contact night-coverage [email or web address] Password [facility] 03/23/2020, 5:48 PM

## 2020-03-23 NOTE — ED Notes (Signed)
Wife & Son allowed at bedside

## 2020-03-23 NOTE — ED Notes (Signed)
Nortriptyline 10mg   Not sent from pharmacy until 0 minutes ago

## 2020-03-23 NOTE — Progress Notes (Signed)
Pt seen in rounding while holding in ER for bed. Hemodynamically stable. No complaints at this time. Still with tachycardia and tachypnea as he has been all day per review of chart. Continue current care.

## 2020-03-23 NOTE — ED Provider Notes (Signed)
[facility] Emergency Department Provider Note MRN:  [id number]  Arrival date & time: 03/23/20     Chief Complaint   Shortness of Breath   History of Present Illness   Robert [name] is a 69 y.o. year-old male with a history of A. fib, DVT presenting to the ED with chief complaint of shortness of breath.  Patient recently diagnosed with COVID-19, hospitalized and discharged yesterday at 4 PM.  Explains that his shortness of breath continued to worsen.  Oxygen saturations in the mid to upper 80s at home despite oxygen that he was sent home with, 3 L nasal cannula.  Currently requiring nonrebreather.  Endorsing mild central chest tightness as well.  Review of Systems  A complete 10 system review of systems was obtained and all systems are negative except as noted in the HPI and PMH.   Patient's Health History    Past Medical History:  Diagnosis Date  . Anxiety   . Asthma    "no attack since acupuncture in 1990's"  . Complication of anesthesia    SLOW TO WAKE UP / DIFFICULTY RESPONDING 2003, 3 days before could use left arm, "wild/crazy sometimes"  . Cyst of left kidney    "benign"  . DVT (deep venous thrombosis) ([place]) 2003   right femoral artery "from groin to knee"  . Edema    Right Leg w/ h/o DVT postphlebitic  . H/O blood transfusion reaction 2004   FFP, extreme swelling, could not breath  . Headache(784.0)    MIGRAINES-not for a long time  . History of pulmonary embolism 2003   both lungs  . Hyperlipidemia   . Knee pain    left  . LBP (low back pain)   . 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 Obesity   . Osteoarthritis   . Peripheral vascular disease ([place])    "poor circulation in  RT leg"  . PONV (postoperative nausea and vomiting)    during surgery in 1990's  . Spondylolisthesis   . Warfarin anticoagulation   . Wheezing    when laying on left side    Past Surgical History:  Procedure Laterality Date  .  APPENDECTOMY  1993   Dr Margot Chimes  . CHOLECYSTECTOMY  1993  . FINGER SURGERY  2012   RT THUMB RECONSTRUCTION  . HAND SURGERY Right    abcess  . HEMORROIDECTOMY  1981  . I & D KNEE WITH POLY EXCHANGE Left 04/04/2013   Procedure: IRRIGATION AND DEBRIDEMENT KNEE WITH POLY EXCHANGE AND INSERTION OF CEMENT BEADS;  Surgeon: Gearlean Alf, MD;  Location: [facility];  Service: Orthopedics;  Laterality: Left;  . KNEE ARTHROSCOPY  1990   RT KNEE  . KNEE ARTHROSCOPY Right 2003  . KNEE ARTHROSCOPY  1988   L KNEE  . KNEE ARTHROSCOPY WITH MENISCAL REPAIR Left 10/22/2012   Procedure: LEFT KNEE ARTHROSCOPY PARTIAL MEDIAL AND LATERAL MENISCECTOMY AND DEBRIDEMENT, CHONDROPLASTY ;  Surgeon: Johnn Hai, MD;  Location: [facility];  Service: Orthopedics;  Laterality: Left;  . LUMBAR DISC SURGERY    . LUMBAR FUSION  2003   Dr Maxie Better  . ORCHIECTOMY  1994   R post injury  . TONSILLECTOMY  1967  . TOTAL KNEE ARTHROPLASTY Left 03/21/2013   Procedure: LEFT TOTAL KNEE ARTHROPLASTY;  Surgeon: Gearlean Alf, MD;  Location: [facility];  Service: Orthopedics;  Laterality: Left;    Family History  Problem Relation Age of Onset  .  Heart disease Mother        CABG at age 4  . Hyperlipidemia Other   . Hypertension Other   . Parkinsonism Other   . Heart disease Sister     Social History   Socioeconomic History  . Marital status: Married    Spouse name: Not on file  . Number of children: 3  . Years of education: Not on file  . Highest education level: Not on file  Occupational History  . Occupation: Disabled    Employer: DISABLED  Tobacco Use  . Smoking status: Never Smoker  . Smokeless tobacco: Never Used  Vaping Use  . Vaping Use: Never used  Substance and Sexual Activity  . Alcohol use: No  . Drug use: No  . Sexual activity: Yes  Other Topics Concern  . Not on file  Social History Narrative   Right handed   One story home   Drinks caffeine coffee q am   Social Determinants of Health   Financial  Resource Strain:   . Difficulty of Paying Living Expenses: Not on file  Food Insecurity:   . Worried About Charity fundraiser in the Last Year: Not on file  . Ran Out of Food in the Last Year: Not on file  Transportation Needs:   . Lack of Transportation (Medical): Not on file  . Lack of Transportation (Non-Medical): Not on file  Physical Activity:   . Days of Exercise per Week: Not on file  . Minutes of Exercise per Session: Not on file  Stress:   . Feeling of Stress : Not on file  Social Connections:   . Frequency of Communication with Friends and Family: Not on file  . Frequency of Social Gatherings with Friends and Family: Not on file  . Attends Religious Services: Not on file  . Active Member of Clubs or Organizations: Not on file  . Attends Archivist Meetings: Not on file  . Marital Status: Not on file  Intimate Partner Violence:   . Fear of Current or Ex-Partner: Not on file  . Emotionally Abused: Not on file  . Physically Abused: Not on file  . Sexually Abused: Not on file     Physical Exam   Vitals:   03/23/20 0645  BP: 138/86  Pulse: (!) 47  Resp: (!) 38  Temp: 99.5 F (37.5 C)  SpO2: 96%    CONSTITUTIONAL: Chronically ill-appearing, obese, mild respiratory distress NEURO:  Alert and oriented x 3, no focal deficits EYES:  eyes equal and reactive ENT/NECK:  no LAD, no JVD CARDIO: Regular rate, well-perfused, normal S1 and S2 PULM: Tachypneic, scattered rhonchi GI/GU:  normal bowel sounds, non-distended, non-tender MSK/SPINE:  No gross deformities, pitting edema bilateral lower extremities, right greater than left SKIN:  no rash, atraumatic PSYCH:  Appropriate speech and behavior  *Additional and/or pertinent findings included in MDM below  Diagnostic and Interventional Summary    EKG Interpretation  Date/Time:  Friday March 23 2020 06:48:05 EDT Ventricular Rate:  118 PR Interval:    QRS Duration: 85 QT Interval:  325 QTC  Calculation: 456 R Axis:   86 Text Interpretation: Atrial fibrillation Borderline right axis deviation Minimal ST depression, diffuse leads Confirmed by Gerlene Fee [phone]) on 03/23/2020 7:14:01 AM      Labs Reviewed  CULTURE, BLOOD (ROUTINE X 2)  CULTURE, BLOOD (ROUTINE X 2)  LACTIC ACID, PLASMA  LACTIC ACID, PLASMA  CBC WITH DIFFERENTIAL/PLATELET  COMPREHENSIVE METABOLIC PANEL  D-DIMER, QUANTITATIVE (  NOT AT [facility])  PROCALCITONIN  LACTATE DEHYDROGENASE  FERRITIN  TRIGLYCERIDES  FIBRINOGEN  C-REACTIVE PROTEIN  TROPONIN I (HIGH SENSITIVITY)    DG Chest Portable 1 View    (Results Pending)    Medications - No data to display   Procedures  /  Critical Care .Critical Care Performed by: Maudie Flakes, MD Authorized by: Maudie Flakes, MD   Critical care provider statement:    Critical care time (minutes):  35   Critical care was necessary to treat or prevent imminent or life-threatening deterioration of the following conditions:  Respiratory failure   Critical care was time spent personally by me on the following activities:  Discussions with consultants, evaluation of patient's response to treatment, examination of patient, ordering and performing treatments and interventions, ordering and review of laboratory studies, ordering and review of radiographic studies, pulse oximetry, re-evaluation of patient's condition, obtaining history from patient or surrogate and review of old charts    ED Course and Medical Decision Making  I have reviewed the triage vital signs, the nursing notes, and pertinent available records from the EMR.  Listed above are laboratory and imaging tests that I personally ordered, reviewed, and interpreted and then considered in my medical decision making (see below for details).  COVID-19 with worsening shortness of breath and increased oxygen requirement.  Able to transition him from nonrebreather to 10 L nasal cannula, saturating at 91%.  Given this  increase from 3 L at discharge, will readmit.  Considering ARDS, secondary bacterial pneumonia, PE felt less likely as patient is anticoagulated.       Barth Kirks. Sedonia Small, MD [place] [email or web address]  Final Clinical Impressions(s) / ED Diagnoses     ICD-10-CM   1. COVID-19  U07.1   2. Acute respiratory failure with hypoxia (HCC)  J96.01     ED Discharge Orders    None       Discharge Instructions Discussed with and Provided to Patient:   Discharge Instructions   None       Maudie Flakes, MD 03/23/20 [phone]

## 2020-03-23 NOTE — ED Triage Notes (Signed)
To ED via [facility], from home, for further eval of coughing and sob. Pt was discharged yesterday - dx with covid. Per EMS, pts RA sat was in high 80's. On NRB pts sat in high 90's. Pt states he feels smoothered by NRB

## 2020-03-23 NOTE — ED Notes (Signed)
Pt's wife Rip Harbour asks for any/all updates on pt condition/rm status be called to cell phone. Number in chart.

## 2020-03-23 NOTE — ED Notes (Signed)
c-t notified pa will be going with the pt

## 2020-03-23 NOTE — ED Notes (Signed)
To ct and back

## 2020-03-23 NOTE — Progress Notes (Signed)
During respiratory assessment of pt, he stated he did not want to be on a ventilator at any point. MD notified.

## 2020-03-24 ENCOUNTER — Inpatient Hospital Stay (HOSPITAL_COMMUNITY): Payer: MEDICARE

## 2020-03-24 DIAGNOSIS — I10 Essential (primary) hypertension: Secondary | ICD-10-CM | POA: Diagnosis not present

## 2020-03-24 DIAGNOSIS — I428 Other cardiomyopathies: Secondary | ICD-10-CM

## 2020-03-24 DIAGNOSIS — J1282 Pneumonia due to coronavirus disease 2019: Secondary | ICD-10-CM

## 2020-03-24 DIAGNOSIS — R7401 Elevation of levels of liver transaminase levels: Secondary | ICD-10-CM | POA: Diagnosis not present

## 2020-03-24 DIAGNOSIS — U071 COVID-19: Secondary | ICD-10-CM | POA: Diagnosis not present

## 2020-03-24 DIAGNOSIS — J9601 Acute respiratory failure with hypoxia: Secondary | ICD-10-CM | POA: Diagnosis not present

## 2020-03-24 DIAGNOSIS — I350 Nonrheumatic aortic (valve) stenosis: Secondary | ICD-10-CM

## 2020-03-24 LAB — ECHOCARDIOGRAM COMPLETE
AR max vel: 1.34 cm2
AV Area VTI: 1.26 cm2
AV Area mean vel: 1.21 cm2
AV Mean grad: 19.3 mmHg
AV Peak grad: 32.1 mmHg
Ao pk vel: 2.83 m/s
S' Lateral: 3.6 cm
Single Plane A4C EF: 46.9 %

## 2020-03-24 LAB — COMPREHENSIVE METABOLIC PANEL
ALT: 150 U/L — ABNORMAL HIGH (ref 0–44)
AST: 55 U/L — ABNORMAL HIGH (ref 15–41)
Albumin: 2.5 g/dL — ABNORMAL LOW (ref 3.5–5.0)
Alkaline Phosphatase: 49 U/L (ref 38–126)
Anion gap: 10 (ref 5–15)
BUN: 17 mg/dL (ref 8–23)
CO2: 27 mmol/L (ref 22–32)
Calcium: 8.4 mg/dL — ABNORMAL LOW (ref 8.9–10.3)
Chloride: 102 mmol/L (ref 98–111)
Creatinine, Ser: 0.94 mg/dL (ref 0.61–1.24)
GFR calc Af Amer: >60 mL/min (ref >60)
GFR calc non Af Amer: >60 mL/min (ref >60)
Glucose, Bld: 143 mg/dL — ABNORMAL HIGH (ref 70–99)
Potassium: 4.5 mmol/L (ref 3.5–5.1)
Sodium: 139 mmol/L (ref 135–145)
Total Bilirubin: 1.3 mg/dL — ABNORMAL HIGH (ref 0.3–1.2)
Total Protein: 6.3 g/dL — ABNORMAL LOW (ref 6.5–8.1)

## 2020-03-24 LAB — MRSA PCR SCREENING: MRSA by PCR: NEGATIVE

## 2020-03-24 LAB — CBC WITH DIFFERENTIAL/PLATELET
Abs Immature Granulocytes: 0.55 10*3/uL — ABNORMAL HIGH (ref 0.00–0.07)
Basophils Absolute: 0.1 10*3/uL (ref 0.0–0.1)
Basophils Relative: 0 %
Eosinophils Absolute: 0 10*3/uL (ref 0.0–0.5)
Eosinophils Relative: 0 %
HCT: 45.6 % (ref 39.0–52.0)
Hemoglobin: 15.4 g/dL (ref 13.0–17.0)
Immature Granulocytes: 4 %
Lymphocytes Relative: 5 %
Lymphs Abs: 0.7 10*3/uL (ref 0.7–4.0)
MCH: 31.8 pg (ref 26.0–34.0)
MCHC: 33.8 g/dL (ref 30.0–36.0)
MCV: 94.2 fL (ref 80.0–100.0)
Monocytes Absolute: 0.6 10*3/uL (ref 0.1–1.0)
Monocytes Relative: 4 %
Neutro Abs: 12.3 10*3/uL — ABNORMAL HIGH (ref 1.7–7.7)
Neutrophils Relative %: 87 %
Platelets: 253 10*3/uL (ref 150–400)
RBC: 4.84 MIL/uL (ref 4.22–5.81)
RDW: 12.5 % (ref 11.5–15.5)
WBC: 14.2 10*3/uL — ABNORMAL HIGH (ref 4.0–10.5)
nRBC: 0 % (ref 0.0–0.2)

## 2020-03-24 LAB — D-DIMER, QUANTITATIVE: D-Dimer, Quant: 17.44 ug/mL-FEU — ABNORMAL HIGH (ref 0.00–0.50)

## 2020-03-24 LAB — MAGNESIUM: Magnesium: 2.2 mg/dL (ref 1.7–2.4)

## 2020-03-24 LAB — PROTIME-INR
INR: 3.2 — ABNORMAL HIGH (ref 0.8–1.2)
Prothrombin Time: 31.9 seconds — ABNORMAL HIGH (ref 11.4–15.2)

## 2020-03-24 LAB — PHOSPHORUS: Phosphorus: 3.5 mg/dL (ref 2.5–4.6)

## 2020-03-24 LAB — C-REACTIVE PROTEIN: CRP: 10.7 mg/dL — ABNORMAL HIGH (ref <1.0)

## 2020-03-24 LAB — FERRITIN: Ferritin: 1648 ng/mL — ABNORMAL HIGH (ref 24–336)

## 2020-03-24 MED ORDER — MOMETASONE FURO-FORMOTEROL FUM 200-5 MCG/ACT IN AERO
2.0000 | INHALATION_SPRAY | Freq: Two times a day (BID) | RESPIRATORY_TRACT | Status: DC
  Administered 2020-03-24 – 2020-03-29 (×10): 2 via RESPIRATORY_TRACT
  Filled 2020-03-24 (×3): qty 8.8

## 2020-03-24 MED ORDER — METHYLPREDNISOLONE SODIUM SUCC 125 MG IJ SOLR
80.0000 mg | Freq: Two times a day (BID) | INTRAMUSCULAR | Status: DC
  Administered 2020-03-24 – 2020-03-29 (×10): 80 mg via INTRAVENOUS
  Filled 2020-03-24 (×10): qty 2

## 2020-03-24 MED ORDER — FAMOTIDINE 20 MG PO TABS
20.0000 mg | ORAL_TABLET | Freq: Every day | ORAL | Status: DC
  Administered 2020-03-24 – 2020-04-04 (×12): 20 mg via ORAL
  Filled 2020-03-24 (×12): qty 1

## 2020-03-24 MED ORDER — FUROSEMIDE 10 MG/ML IJ SOLN
60.0000 mg | Freq: Once | INTRAMUSCULAR | Status: AC
  Administered 2020-03-24: 60 mg via INTRAVENOUS
  Filled 2020-03-24: qty 6

## 2020-03-24 NOTE — Progress Notes (Signed)
Patient requested to speak to RT because he was told that he need BIPAP. RT explained to patient that he is currently maintaining his oxygen on a non-rebreather, he is not in any distress and his vitals are currently stable. BIPAP not needed at this time.

## 2020-03-24 NOTE — Progress Notes (Signed)
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  He was down to about 3 L of oxygen.  He was discharged with home oxygen.  Apparently became more short of breath at home.  Called EMS.  Oxygen requirements were higher.  He was subsequently hospitalized for further management.  Reason for Visit: Pneumonia due to COVID-19.  Acute respiratory failure with hypoxia  Consultants: None  Procedures: None  Antibiotics: Anti-infectives (From admission, onward)   None      Subjective/Interval History: Patient noted to be somewhat lethargic and fatigued.  Easily arousable.  Denies any chest pain.  States that his shortness of breath is about the same.  Denies any nausea or vomiting.  Just feels very tired.    Assessment/Plan:  Acute Hypoxic Resp. Failure/Pneumonia due to COVID-19   Recent Labs  Lab 03/18/20 0412 03/18/20 0412 03/19/20 0500 03/19/20 0500 03/20/20 0615 03/21/20 0909 03/22/20 1118 03/23/20 0703 03/23/20 0900 03/23/20 1647 03/24/20 0500  DDIMER 0.42   < >  --   --   --  <0.27 0.63*  --  1.75* 2.74* 17.44*  FERRITIN 1,024*   < > 1,042*  --  1,014* 1,224*  --  1,502*  --   --  1,648*  CRP 7.1*   < > 4.8*   < > 1.7* 0.8 0.7 0.9  --   --  10.7*  ALT  --   --  31   < > 48* 155* 224* 203*  --   --  150*  PROCALCITON 0.12  --   --   --   --   --   --  <0.10  --  0.11  --    < > = values in this interval not displayed.    Objective findings: Fever: Noted to be afebrile Oxygen requirements: Was on a nonrebreather saturating in the mid 90s.  Changed over to high flow nasal cannula 15 L/min.  COVID 19 Therapeutics: Antibacterials: None.  Procalcitonin 0.11 Remdesivir:  Recently completed a 5-day course Steroids: Solu-Medrol Diuretics: We will give a dose of Lasix x1 today Inhaled Steroids: Initiate Dulera Baricitinib: Initially started on September 26.  Resumed on 10/1 after discussion of risks and benefits. PUD Prophylaxis: Pepcid DVT Prophylaxis: Patient supposed to be on warfarin.  When he was discharged he had therapeutic INR.  INR today is 3.2.    Patient unfortunately had to be rehospitalized as he was experiencing worsening symptoms and he was noted to be more hypoxic.  Currently requiring 15 L.  His steroids and baricitinib have been resumed.  He completed a 5-day course of Remdesivir.  We will initiate inhaled steroids.  Give him a dose of furosemide.  Continue to mobilize.  PT and OT evaluation. Patient encouraged to stay in prone position as much as possible.  Incentive spirometer.  Mobilization.  D-dimer noted to be significantly elevated at 17.44.  Patient underwent CT angiogram which did not show any PE.  We will also proceed with lower extremity Doppler studies.  Patient noted to be on warfarin for history of VTE.  Will consider switching him over to Lovenox for now.  INR noted to be therapeutic.  The treatment plan and use of medications and known side effects were discussed  with patient/family. Some of the medications used are based on case reports/anecdotal data.  All other medications being used in the management of COVID-19 based on limited study data.  Complete risks and long-term side effects are unknown, however in the best clinical judgment they seem to be of some benefit.  Patient/family wanted to proceed with treatment options provided.  Transaminitis Most likely due to COVID-19.  Trend has been improving.  Essential hypertension Blood pressure noted to be reasonably well controlled at this time.  Continue to monitor.  Significant lower extremity edema Will do lower extremity Doppler studies as discussed above.  Will give him Lasix.   Follow-up on echocardiogram.  Patient does have history of edema but this is much worse than before.  Echocardiogram from 2019 showed normal systolic function.  Mild aortic stenosis was noted.  History of MAT/SVT Continue telemetry monitoring.  Looks like he has been seen by Dr. Harrell Gave previously with cardiology.  Not noted to be on any AV blocking medications.  Looks like he had refused medication at last visit.  Currently is in sinus rhythm.  Monitor closely.  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  See discussion above as well.  D-dimer noted to be significantly elevated.  CT angiogram was negative.  Will do lower extremity Doppler studies.  Change him over to full dose Lovenox for now instead of warfarin.  History of bronchial asthma Stable.  No wheezing appreciated at this time.  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  Holding his prednisone while he is getting Solu-Medrol.  Recent acute metabolic encephalopathy Apparently was confused during his previous hospitalization.  Seems to be stable currently.  Mentating normally.  Continue to monitor.  Morbid obesity Estimated body mass index is 45.29 kg/m as calculated from the following:   Height as of 03/17/20: 5\' 11"  (1.803 m).   Weight as of 03/18/20: 147.3 kg.   DVT Prophylaxis: Will initiate full dose Lovenox Code Status: DNR.  Patient made very clear yesterday that he did not want any heroic measures. Family Communication: We will call his family later today Disposition Plan: Hopefully return home when improved with  Status is: Inpatient  Remains inpatient appropriate because:IV treatments appropriate due to intensity of illness or inability to take PO and Inpatient level of care appropriate due to severity of illness   Dispo: The patient is from: Home              Anticipated d/c is to: Home              Anticipated d/c date  is: > 3 days              Patient currently is not medically stable to d/c.      Medications:  Scheduled: . baricitinib  4 mg Oral Daily  . methylPREDNISolone (SOLU-MEDROL) injection  40 mg Intravenous Q12H  . nortriptyline  10 mg Oral QPM  . sodium chloride flush  3 mL Intravenous Q12H   Continuous:  DGU:[id number], benzonatate   Objective:  Vital Signs  Vitals:   03/24/20 0630 03/24/20 0913 03/24/20 0923 03/24/20 1013  BP: 129/71 (!) 153/91  137/76  Pulse: 75 79 86 91  Resp: (!) 23 20 20 20   Temp:      TempSrc:      SpO2: 96% 93% 90% 91%    Intake/Output Summary (Last 24 hours) at 03/24/2020 1037 Last data filed at  03/24/2020 0249 Gross per 24 hour  Intake 438.83 ml  Output --  Net 438.83 ml   There were no vitals filed for this visit.  General appearance:  Lethargic but easily arousable.  In no distress Resp: Tachypneic.  No use of accessory muscles.  Crackles bilateral bases.  No wheezing or rhonchi.   Cardio: S1-S2 is normal regular.  No S3-S4.  No rubs murmurs or bruit GI: Abdomen is soft.  Nontender nondistended.  Bowel sounds are present normal.  No masses organomegaly Extremities: 1-2+ edema noted bilateral lower extremities. Neurologic:  No focal neurological deficits.    Lab Results:  Data Reviewed: I have personally reviewed following labs and imaging studies  CBC: Recent Labs  Lab 03/20/20 0615 03/20/20 0615 03/21/20 0909 03/21/20 0909 03/22/20 1118 03/23/20 0703 03/23/20 1647 03/23/20 1757 03/24/20 0500  WBC 11.6*   < > 10.7*  --  11.4* 13.3* 12.5*  --  14.2*  NEUTROABS 9.3*  --  8.8*  --  9.7* 10.4*  --   --  12.3*  HGB 15.6   < > 15.5   < > 15.9 15.6 15.3 14.3 15.4  HCT 44.9   < > 44.8   < > 46.8 45.5 45.6 42.0 45.6  MCV 93.9   < > 93.5  --  93.6 93.4 95.2  --  94.2  PLT 247   < > 243  --  245 283 259  --  253   < > = values in this interval not displayed.    Basic Metabolic Panel: Recent Labs  Lab 03/20/20 0615 03/20/20 0615  03/21/20 0909 03/22/20 1118 03/23/20 0703 03/23/20 1647 03/23/20 1757 03/24/20 0500  NA 133*   < > 135 136 138  --  138 139  K 4.1   < > 3.8 3.6 3.6  --  4.3 4.5  CL 98  --  99 98 102  --   --  102  CO2 22  --  24 25 24   --   --  27  GLUCOSE 147*  --  178* 223* 97  --   --  143*  BUN 23  --  30* 28* 24*  --   --  17  CREATININE 0.94   < > 1.13 1.14 1.07 1.08  --  0.94  CALCIUM 8.8*  --  8.6* 8.5* 8.3*  --   --  8.4*  MG  --   --   --   --   --   --   --  2.2  PHOS  --   --   --   --   --   --   --  3.5   < > = values in this interval not displayed.    GFR: Estimated Creatinine Clearance: 110.7 mL/min (by C-G formula based on SCr of 0.94 mg/dL).  Liver Function Tests: Recent Labs  Lab 03/20/20 0615 03/21/20 0909 03/22/20 1118 03/23/20 0703 03/24/20 0500  AST 61* 149* 137* 101* 55*  ALT 48* 155* 224* 203* 150*  ALKPHOS 37* 40 43 48 49  BILITOT 1.1 1.2 1.1 1.7* 1.3*  PROT 6.7 6.7 6.7 6.6 6.3*  ALBUMIN 2.8* 2.7* 2.9* 2.8* 2.5*    Coagulation Profile: Recent Labs  Lab 03/19/20 0500 03/20/20 0615 03/21/20 0909 03/22/20 1118 03/24/20 0500  INR 1.4* 2.0* 2.6* 2.7* 3.2*    Lipid Profile: Recent Labs    03/23/20 0703  TRIG 112    Anemia Panel: Recent Labs  03/23/20 0703 03/24/20 0500  FERRITIN 1,502* 1,648*    Recent Results (from the past 240 hour(s))  Blood Culture (routine x 2)     Status: None   Collection Time: 03/18/20 12:11 AM   Specimen: BLOOD  Result Value Ref Range Status   Specimen Description BLOOD RIGHT ARM  Final   Special Requests   Final    BOTTLES DRAWN AEROBIC AND ANAEROBIC Blood Culture adequate volume   Culture   Final    NO GROWTH 5 DAYS Performed at [facility], [address], [place], [place] [address]    Report Status 03/23/2020 FINAL  Final  Blood Culture (routine x 2)     Status: None   Collection Time: 03/18/20 12:11 AM   Specimen: BLOOD  Result Value Ref Range Status   Specimen Description BLOOD LEFT HAND   Final   Special Requests   Final    BOTTLES DRAWN AEROBIC AND ANAEROBIC Blood Culture results may not be optimal due to an inadequate volume of blood received in culture bottles   Culture   Final    NO GROWTH 5 DAYS Performed at [facility], [place] [address], [place], [place] [address]    Report Status 03/23/2020 FINAL  Final  Respiratory Panel by RT PCR (Flu A&B, Covid) - Nasopharyngeal Swab     Status: Abnormal   Collection Time: 03/18/20 12:11 AM   Specimen: Nasopharyngeal Swab  Result Value Ref Range Status   SARS Coronavirus 2 by RT PCR POSITIVE (A) NEGATIVE Final    Comment: RESULT CALLED TO, READ BACK BY AND VERIFIED WITH: L. VENEGAS,RN [id number] 03/18/2020 T. TYSOR (NOTE) SARS-CoV-2 target nucleic acids are DETECTED.  SARS-CoV-2 RNA is generally detectable in upper respiratory specimens  during the acute phase of infection. Positive results are indicative of the presence of the identified virus, but do not rule out bacterial infection or co-infection with other pathogens not detected by the test. Clinical correlation with patient history and other diagnostic information is necessary to determine patient infection status. The expected result is Negative.  Fact Sheet for Patients:  PinkCheek.be  Fact Sheet for Healthcare Providers: GravelBags.it  This test is not yet approved or cleared by the [place] FDA and  has been authorized for detection and/or diagnosis of SARS-CoV-2 by FDA under an Emergency Use Authorization (EUA).  This EUA will remain in effect (meaning this test can  be used) for the duration of  the COVID-19 declaration under Section 564(b)(1) of the Act, 21 U.S.C. section 360bbb-3(b)(1), unless the authorization is terminated or revoked sooner.      Influenza A by PCR NEGATIVE NEGATIVE Final   Influenza B by PCR NEGATIVE NEGATIVE Final    Comment: (NOTE) The Xpert Xpress SARS-CoV-2/FLU/RSV  assay is intended as an aid in  the diagnosis of influenza from Nasopharyngeal swab specimens and  should not be used as a sole basis for treatment. Nasal washings and  aspirates are unacceptable for Xpert Xpress SARS-CoV-2/FLU/RSV  testing.  Fact Sheet for Patients: PinkCheek.be  Fact Sheet for Healthcare Providers: GravelBags.it  This test is not yet approved or cleared by the [place] FDA and  has been authorized for detection and/or diagnosis of SARS-CoV-2 by  FDA under an Emergency Use Authorization (EUA). This EUA will remain  in effect (meaning this test can be used) for the duration of the  Covid-19 declaration under Section 564(b)(1) of the Act, 21  U.S.C. section 360bbb-3(b)(1), unless the authorization is  terminated or revoked. Performed at [facility], [place] [address], [place], [place] [address]   Urine culture     Status: Abnormal   Collection Time: 03/18/20  2:32 AM   Specimen: In/Out Cath Urine  Result Value Ref Range Status   Specimen Description IN/OUT CATH URINE  Final   Special Requests   Final    NONE Performed at [facility], [place] [address], [place], [place] [address]    Culture MULTIPLE SPECIES PRESENT, SUGGEST RECOLLECTION (A)  Final   Report Status 03/19/2020 FINAL  Final  Blood Culture (routine x 2)     Status: None (Preliminary result)   Collection Time: 03/23/20  7:40 AM   Specimen: BLOOD  Result Value Ref Range Status   Specimen Description BLOOD RIGHT ANTECUBITAL  Final   Special Requests   Final    BOTTLES DRAWN AEROBIC AND ANAEROBIC Blood Culture results may not be optimal due to an inadequate volume of blood received in culture bottles   Culture   Final    NO GROWTH < 24 HOURS Performed at [facility], [place] [address], [place], [place] [address]    Report Status PENDING  Incomplete  Blood Culture (routine x 2)     Status: None (Preliminary result)    Collection Time: 03/23/20  8:00 AM   Specimen: BLOOD  Result Value Ref Range Status   Specimen Description BLOOD LEFT ANTECUBITAL  Final   Special Requests   Final    BOTTLES DRAWN AEROBIC AND ANAEROBIC Blood Culture adequate volume   Culture   Final    NO GROWTH < 24 HOURS Performed at [facility], [place] [address] [place], [place] [address]    Report Status PENDING  Incomplete      Radiology Studies: CT ANGIO CHEST PE W OR WO CONTRAST  Result Date: 03/23/2020 CLINICAL DATA:  Shortness of breath. COVID positive, recent hospitalization post discharge yesterday. EXAM: CT ANGIOGRAPHY CHEST WITH CONTRAST TECHNIQUE: Multidetector CT imaging of the chest was performed using the standard protocol during bolus administration of intravenous contrast. Multiplanar CT image reconstructions and MIPs were obtained to evaluate the vascular anatomy. CONTRAST:  145mL OMNIPAQUE IOHEXOL 350 MG/ML SOLN COMPARISON:  Radiograph earlier today.  Chest CTA 07/04/2013 FINDINGS: Cardiovascular: There are no filling defects within the pulmonary arteries to suggest pulmonary embolus. There is significant breathing motion artifact, evaluation is diagnostic to the proximal segmental level. Cannot assess distal segmental and subsegmental branches. Thoracic aorta is normal in caliber. There is no aortic dissection. Moderate aortic atherosclerosis. Borderline cardiomegaly. There mitral annulus calcifications. Coronary artery calcifications. No pericardial effusion. Small amount of contrast refluxes into the hepatic veins and IVC. Mediastinum/Nodes: Small hiatal hernia. Small paratracheal and right infrahilar nodes not enlarged by size criteria. No suspicious thyroid nodule. No pneumomediastinum. Lungs/Pleura: Multifocal patchy and geographic ground-glass opacities within all lobes of both lungs. Some of these opacities appear coalescing peripherally. No convincing septal thickening or pulmonary edema. No pneumothorax. Trachea  and central bronchi are patent. No pleural fluid. The previous right lower lobe pulmonary nodule is obscured by opacities on the current exam. Upper Abdomen: Minimal contrast refluxing into the hepatic veins and IVC. Suspected hepatic steatosis. Cholecystectomy. Multiple cysts in the left kidney, incompletely characterized on this chest CTA, but grossly stable from 2015. Musculoskeletal: Multilevel degenerative change throughout the spine. There are no acute or suspicious osseous abnormalities. Review of the MIP images confirms the above findings. IMPRESSION: 1. No pulmonary embolus  allowing for significant breathing motion artifact. 2. Multifocal patchy and geographic ground-glass opacities within all lobes of both lungs, pattern consistent with COVID-19 pneumonia. 3. Small amount of contrast refluxing into the hepatic veins and IVC, suggesting elevated right heart pressures. 4. Small hiatal hernia. 5. Aortic atherosclerosis and coronary artery calcifications. Aortic Atherosclerosis (ICD10-I70.0). Electronically Signed   By: Keith Rake M.D.   On: 03/23/2020 17:05   DG Chest Portable 1 View  Result Date: 03/23/2020 CLINICAL DATA:  Shortness of breath EXAM: PORTABLE CHEST 1 VIEW COMPARISON:  03/17/2020 FINDINGS: Cardiomegaly. Bilateral indistinct airspace opacities that have progressed. No clear Kerley lines. No pleural effusion or pneumothorax. IMPRESSION: Progressive pulmonary infiltrates. Electronically Signed   By: Monte Fantasia M.D.   On: 03/23/2020 07:18       LOS: 1 day   [facility] Pager on [email or web address]  03/24/2020, 10:37 AM

## 2020-03-24 NOTE — Progress Notes (Signed)
  Echocardiogram 2D Echocardiogram has been performed.  Robert [name] F 03/24/2020, 3:23 PM

## 2020-03-24 NOTE — ED Notes (Signed)
Placed patient on bedpan patient is able to move himself in the bed o2 sats remain 93-100%. MD at bedside will weaned o2 down .

## 2020-03-24 NOTE — ED Notes (Signed)
Pt soiled bed due to leakage from male external catheter

## 2020-03-24 NOTE — ED Notes (Signed)
Placed on nasal canula 12 liters

## 2020-03-24 NOTE — Progress Notes (Signed)
Called by RN.  Reports while doing assessment and placing purple DNR bracelet on patient patient reported he did not want to be a DNR and now wants to be a full code.  CODE STATUS changed to full code

## 2020-03-24 NOTE — Progress Notes (Signed)
Patient trasfered from ED to [phone] via bed; alert and oriented x 4; no complaints of pain; IV saline locked in LAC and RAC; 15 L HFNC with sat O2 90%; skin intact; bruising bilateral abdomen. Orient patient to room and unit; placed on tele per MD order; gave patient care guide; instructed how to use the call bell and  fall risk precautions. Will continue to monitor the patient.

## 2020-03-24 NOTE — ED Notes (Signed)
Report given to nurse Ardeen Fillers, RN, nurse upset about bed been assigned for 4 hours and not given report, RN oriented that for all this time bed said assigned not ready that her CN will have to change the bed to ready.

## 2020-03-24 NOTE — ED Notes (Signed)
Robert [name], wife, [phone] would like an update when available

## 2020-03-25 ENCOUNTER — Inpatient Hospital Stay (HOSPITAL_COMMUNITY): Payer: MEDICARE

## 2020-03-25 DIAGNOSIS — U071 COVID-19: Secondary | ICD-10-CM | POA: Diagnosis not present

## 2020-03-25 DIAGNOSIS — J9601 Acute respiratory failure with hypoxia: Secondary | ICD-10-CM | POA: Diagnosis not present

## 2020-03-25 DIAGNOSIS — R7401 Elevation of levels of liver transaminase levels: Secondary | ICD-10-CM | POA: Diagnosis not present

## 2020-03-25 DIAGNOSIS — M7989 Other specified soft tissue disorders: Secondary | ICD-10-CM

## 2020-03-25 DIAGNOSIS — R7989 Other specified abnormal findings of blood chemistry: Secondary | ICD-10-CM

## 2020-03-25 DIAGNOSIS — I10 Essential (primary) hypertension: Secondary | ICD-10-CM | POA: Diagnosis not present

## 2020-03-25 LAB — CBC WITH DIFFERENTIAL/PLATELET
Abs Immature Granulocytes: 0.42 10*3/uL — ABNORMAL HIGH (ref 0.00–0.07)
Basophils Absolute: 0.1 10*3/uL (ref 0.0–0.1)
Basophils Relative: 1 %
Eosinophils Absolute: 0 10*3/uL (ref 0.0–0.5)
Eosinophils Relative: 0 %
HCT: 46 % (ref 39.0–52.0)
Hemoglobin: 15.7 g/dL (ref 13.0–17.0)
Immature Granulocytes: 3 %
Lymphocytes Relative: 3 %
Lymphs Abs: 0.4 10*3/uL — ABNORMAL LOW (ref 0.7–4.0)
MCH: 32.2 pg (ref 26.0–34.0)
MCHC: 34.1 g/dL (ref 30.0–36.0)
MCV: 94.3 fL (ref 80.0–100.0)
Monocytes Absolute: 0.5 10*3/uL (ref 0.1–1.0)
Monocytes Relative: 3 %
Neutro Abs: 12.1 10*3/uL — ABNORMAL HIGH (ref 1.7–7.7)
Neutrophils Relative %: 90 %
Platelets: 273 10*3/uL (ref 150–400)
RBC: 4.88 MIL/uL (ref 4.22–5.81)
RDW: 12.5 % (ref 11.5–15.5)
WBC: 13.5 10*3/uL — ABNORMAL HIGH (ref 4.0–10.5)
nRBC: 0 % (ref 0.0–0.2)

## 2020-03-25 LAB — COMPREHENSIVE METABOLIC PANEL
ALT: 107 U/L — ABNORMAL HIGH (ref 0–44)
AST: 31 U/L (ref 15–41)
Albumin: 2.5 g/dL — ABNORMAL LOW (ref 3.5–5.0)
Alkaline Phosphatase: 55 U/L (ref 38–126)
Anion gap: 10 (ref 5–15)
BUN: 23 mg/dL (ref 8–23)
CO2: 28 mmol/L (ref 22–32)
Calcium: 8.4 mg/dL — ABNORMAL LOW (ref 8.9–10.3)
Chloride: 99 mmol/L (ref 98–111)
Creatinine, Ser: 1 mg/dL (ref 0.61–1.24)
GFR calc Af Amer: >60 mL/min (ref >60)
GFR calc non Af Amer: >60 mL/min (ref >60)
Glucose, Bld: 180 mg/dL — ABNORMAL HIGH (ref 70–99)
Potassium: 4.3 mmol/L (ref 3.5–5.1)
Sodium: 137 mmol/L (ref 135–145)
Total Bilirubin: 1.4 mg/dL — ABNORMAL HIGH (ref 0.3–1.2)
Total Protein: 6.8 g/dL (ref 6.5–8.1)

## 2020-03-25 LAB — D-DIMER, QUANTITATIVE: D-Dimer, Quant: >20 ug/mL-FEU — ABNORMAL HIGH (ref 0.00–0.50)

## 2020-03-25 LAB — C-REACTIVE PROTEIN: CRP: 8.7 mg/dL — ABNORMAL HIGH (ref <1.0)

## 2020-03-25 LAB — MAGNESIUM: Magnesium: 2.2 mg/dL (ref 1.7–2.4)

## 2020-03-25 MED ORDER — FUROSEMIDE 10 MG/ML IJ SOLN
40.0000 mg | Freq: Once | INTRAMUSCULAR | Status: AC
  Administered 2020-03-25: 40 mg via INTRAVENOUS
  Filled 2020-03-25: qty 4

## 2020-03-25 NOTE — Progress Notes (Signed)
During shift change report off going nurse reported code status as DNR and while attempting to place purple bracelet on arm patient verbalized he wanted to be full  code.Will notify MD.

## 2020-03-25 NOTE — Progress Notes (Signed)
Lower extremity venous bilateral study completed.   Please see CV Proc for preliminary results.   Malorie Bigford, RDMS  

## 2020-03-25 NOTE — Progress Notes (Addendum)
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  He was down to about 3 L of oxygen.  He was discharged with home oxygen.  Apparently became more short of breath at home.  Called EMS.  Oxygen requirements were higher.  He was subsequently hospitalized for further management.  Reason for Visit: Pneumonia due to COVID-19.  Acute respiratory failure with hypoxia  Consultants: None  Procedures: None  Antibiotics: Anti-infectives (From admission, onward)   None      Subjective/Interval History: Patient remains short of breath.  Denies any chest pain.  Has not noted any significant change compared to yesterday.  No nausea vomiting.  Continues to have a dry cough.      Assessment/Plan:  Acute Hypoxic Resp. Failure/Pneumonia due to COVID-19   Recent Labs  Lab 03/19/20 0500 03/19/20 0500 03/20/20 0615 03/20/20 0615 03/21/20 0909 03/21/20 0909 03/22/20 1118 03/23/20 0703 03/23/20 0900 03/23/20 1647 03/24/20 0500 03/25/20 0500  DDIMER  --   --   --   --  <0.27   < > 0.63*  --  1.75* 2.74* 17.44* >20.00*  FERRITIN 1,042*  --  1,014*  --  1,224*  --   --  1,502*  --   --  1,648*  --   CRP 4.8*   < > 1.7*   < > 0.8  --  0.7 0.9  --   --  10.7* 8.7*  ALT 31   < > 48*   < > 155*  --  224* 203*  --   --  150* 107*  PROCALCITON  --   --   --   --   --   --   --  <0.10  --  0.11  --   --    < > = values in this interval not displayed.    Objective findings: Oxygen requirements: Remains on high flow nasal cannula at 15 L.  Saturating in the early 90s.    COVID 19 Therapeutics: Antibacterials: None.  Procalcitonin 0.11 Remdesivir: Recently completed a 5-day course Steroids:  Solu-Medrol Diuretics: Lasix given x1 yesterday.  Will repeat dose today. Inhaled Steroids: Dulera Baricitinib: Initially started on September 26.  Resumed on 10/1 after discussion of risks and benefits. PUD Prophylaxis: Pepcid DVT Prophylaxis: Therapeutic Lovenox currently.  On warfarin prior to admission.  Patient had to be rehospitalized due to worsening respiratory symptoms.  From a respiratory standpoint patient remains tenuous.  He is on 15 L HFNC saturating in the early 90s.  He remains on steroids and baricitinib.  He recently completed a 5-day course of Remdesivir.  Continue Dulera.  Lasix to be repeated today.  PT and OT evaluation.  Prone positioning incentive spirometry and mobilization.  D-dimer noted to be greater than 20.  CT angiogram at the time of admission did not show any PE.  Lower extremity Doppler study is pending.  Patient remains on therapeutic Lovenox.  The treatment plan and use of medications and known side effects were discussed with patient/family. Some of the medications used are based on case reports/anecdotal data.  All other medications being used in the management of COVID-19 based on limited study data.  Complete risks and long-term side effects are unknown, however in the best  clinical judgment they seem to be of some benefit.  Patient/family wanted to proceed with treatment options provided.  Transaminitis Most likely due to COVID-19.  Trend has been improving.  Essential hypertension Blood pressure noted to be reasonably well controlled at this time.  Continue to monitor.  Significant lower extremity edema Lower extremity Doppler studies pending.  Echocardiogram shows normal systolic function.  No significant valvular abnormalities noted.  Repeat dose of Lasix today.   History of MAT/SVT/irregular heart rhythm Telemetry shows irregular heart rhythm.  Being read as atrial fibrillation.  Patient does have a history of MAT and SVT but no previously  documented history of atrial fibrillation.  Will repeat EKG.  Continue telemetry monitoring.  TSH. Looks like he has been seen by Dr. Harrell Gave previously with cardiology.  Not noted to be on any AV blocking medications.  Looks like he had refused medication at last visit.    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  See discussion above as well.  D-dimer noted to be significantly elevated.  CT angiogram was negative.  Will do lower extremity Doppler studies.  He was changed over to therapeutic Lovenox.  History of bronchial asthma Stable.  No wheezing appreciated at this time.  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  Holding his prednisone while he is getting Solu-Medrol.  Recent acute metabolic encephalopathy Apparently was confused during his previous hospitalization.  Seems to be stable currently.  Mentating normally.  Continue to monitor.  Morbid obesity Estimated body mass index is 45.29 kg/m as calculated from the following:   Height as of 03/17/20: 5\' 11"  (1.803 m).   Weight as of 03/18/20: 147.3 kg.   DVT Prophylaxis:  full dose Lovenox Code Status: There is been some confusion regarding his CODE STATUS.  Discussed with the patient's wife as well as patient again today.  He does not want intubation and mechanical ventilation but is okay with CPR and other cardiac interventions if needed.  Will change to limited resuscitation. Family Communication: Wife was updated yesterday.  Will call her again today. Disposition Plan: Hopefully return home when improved with  Status is: Inpatient  Remains inpatient appropriate because:IV treatments appropriate due to intensity of illness or inability to take PO and Inpatient level of care appropriate due to severity of illness   Dispo: The patient is from: Home              Anticipated d/c is to: Home              Anticipated d/c date is: > 3  days              Patient currently is not medically stable to d/c.      Medications:  Scheduled:  baricitinib  4 mg Oral Daily   famotidine  20 mg Oral Daily   methylPREDNISolone (SOLU-MEDROL) injection  80 mg Intravenous Q12H   mometasone-formoterol  2 puff Inhalation BID   nortriptyline  10 mg Oral QPM   sodium chloride flush  3 mL Intravenous Q12H   Continuous:  UVO:[id number], benzonatate   Objective:  Vital Signs  Vitals:   03/25/20 0000 03/25/20 0400 03/25/20 0755 03/25/20 1204  BP: (!) 141/74 (!) 129/97 (!) 134/91 135/74  Pulse: 86 94 95 87  Resp: (!) 25 (!) 23 20 19   Temp: 98.6 F (37 C) 99.1 F (37.3 C) 98 F (36.7 C) 97.7 F (36.5 C)  TempSrc: Axillary Axillary Oral Oral  SpO2: 97% 97% 96% 94%    Intake/Output Summary (Last 24 hours) at 03/25/2020 1211 Last data filed at 03/25/2020 0846 Gross per 24 hour  Intake 240 ml  Output 250 ml  Net -10 ml   There were no vitals filed for this visit.   General appearance: Awake alert.  In no distress.  Seems anxious Resp: Tachypneic.  No use of accessory muscles.  Crackles bilateral bases.  No wheezing or rhonchi. Cardio: S1-S2 is irregularly irregular.  No S3-S4.  No rubs or bruit. GI: Abdomen is soft.  Nontender nondistended.  Bowel sounds are present normal.  No masses organomegaly Extremities: No edema.  Full range of motion of lower extremities. Neurologic: Alert and oriented x3.  No focal neurological deficits.     Lab Results:  Data Reviewed: I have personally reviewed following labs and imaging studies  CBC: Recent Labs  Lab 03/21/20 0909 03/21/20 0909 03/22/20 1118 03/22/20 1118 03/23/20 0703 03/23/20 1647 03/23/20 1757 03/24/20 0500 03/25/20 0500  WBC 10.7*   < > 11.4*  --  13.3* 12.5*  --  14.2* 13.5*  NEUTROABS 8.8*  --  9.7*  --  10.4*  --   --  12.3* 12.1*  HGB 15.5   < > 15.9   < > 15.6 15.3 14.3 15.4 15.7  HCT 44.8   < > 46.8   < > 45.5 45.6 42.0 45.6 46.0  MCV 93.5   <  > 93.6  --  93.4 95.2  --  94.2 94.3  PLT 243   < > 245  --  283 259  --  253 273   < > = values in this interval not displayed.    Basic Metabolic Panel: Recent Labs  Lab 03/21/20 0909 03/21/20 0909 03/22/20 1118 03/23/20 0703 03/23/20 1647 03/23/20 1757 03/24/20 0500 03/25/20 0500  NA 135   < > 136 138  --  138 139 137  K 3.8   < > 3.6 3.6  --  4.3 4.5 4.3  CL 99  --  98 102  --   --  102 99  CO2 24  --  25 24  --   --  27 28  GLUCOSE 178*  --  223* 97  --   --  143* 180*  BUN 30*  --  28* 24*  --   --  17 23  CREATININE 1.13   < > 1.14 1.07 1.08  --  0.94 1.00  CALCIUM 8.6*  --  8.5* 8.3*  --   --  8.4* 8.4*  MG  --   --   --   --   --   --  2.2 2.2  PHOS  --   --   --   --   --   --  3.5  --    < > = values in this interval not displayed.    GFR: Estimated Creatinine Clearance: 104.1 mL/min (by C-G formula based on SCr of 1 mg/dL).  Liver Function Tests: Recent Labs  Lab 03/21/20 0909 03/22/20 1118 03/23/20 0703 03/24/20 0500 03/25/20 0500  AST 149* 137* 101* 55* 31  ALT 155* 224* 203* 150* 107*  ALKPHOS 40 43 48 49 55  BILITOT 1.2 1.1 1.7* 1.3* 1.4*  PROT 6.7 6.7 6.6 6.3* 6.8  ALBUMIN 2.7* 2.9* 2.8* 2.5* 2.5*    Coagulation Profile: Recent Labs  Lab 03/19/20 0500 03/20/20 0615 03/21/20 0909 03/22/20 1118 03/24/20  0500  INR 1.4* 2.0* 2.6* 2.7* 3.2*    Lipid Profile: Recent Labs    03/23/20 0703  TRIG 112    Anemia Panel: Recent Labs    03/23/20 0703 03/24/20 0500  FERRITIN 1,502* 1,648*    Recent Results (from the past 240 hour(s))  Blood Culture (routine x 2)     Status: None   Collection Time: 03/18/20 12:11 AM   Specimen: BLOOD  Result Value Ref Range Status   Specimen Description BLOOD RIGHT ARM  Final   Special Requests   Final    BOTTLES DRAWN AEROBIC AND ANAEROBIC Blood Culture adequate volume   Culture   Final    NO GROWTH 5 DAYS Performed at [facility], [address] [place], [place] [address]    Report  Status 03/23/2020 FINAL  Final  Blood Culture (routine x 2)     Status: None   Collection Time: 03/18/20 12:11 AM   Specimen: BLOOD  Result Value Ref Range Status   Specimen Description BLOOD LEFT HAND  Final   Special Requests   Final    BOTTLES DRAWN AEROBIC AND ANAEROBIC Blood Culture results may not be optimal due to an inadequate volume of blood received in culture bottles   Culture   Final    NO GROWTH 5 DAYS Performed at [facility], [place] [address], [place], [place] [address]    Report Status 03/23/2020 FINAL  Final  Respiratory Panel by RT PCR (Flu A&B, Covid) - Nasopharyngeal Swab     Status: Abnormal   Collection Time: 03/18/20 12:11 AM   Specimen: Nasopharyngeal Swab  Result Value Ref Range Status   SARS Coronavirus 2 by RT PCR POSITIVE (A) NEGATIVE Final    Comment: RESULT CALLED TO, READ BACK BY AND VERIFIED WITH: L. VENEGAS,RN [id number] 03/18/2020 T. TYSOR (NOTE) SARS-CoV-2 target nucleic acids are DETECTED.  SARS-CoV-2 RNA is generally detectable in upper respiratory specimens  during the acute phase of infection. Positive results are indicative of the presence of the identified virus, but do not rule out bacterial infection or co-infection with other pathogens not detected by the test. Clinical correlation with patient history and other diagnostic information is necessary to determine patient infection status. The expected result is Negative.  Fact Sheet for Patients:  PinkCheek.be  Fact Sheet for Healthcare Providers: GravelBags.it  This test is not yet approved or cleared by the [place] FDA and  has been authorized for detection and/or diagnosis of SARS-CoV-2 by FDA under an Emergency Use Authorization (EUA).  This EUA will remain in effect (meaning this test can  be used) for the duration of  the COVID-19 declaration under Section 564(b)(1) of the Act, 21 U.S.C. section 360bbb-3(b)(1), unless  the authorization is terminated or revoked sooner.      Influenza A by PCR NEGATIVE NEGATIVE Final   Influenza B by PCR NEGATIVE NEGATIVE Final    Comment: (NOTE) The Xpert Xpress SARS-CoV-2/FLU/RSV assay is intended as an aid in  the diagnosis of influenza from Nasopharyngeal swab specimens and  should not be used as a sole basis for treatment. Nasal washings and  aspirates are unacceptable for Xpert Xpress SARS-CoV-2/FLU/RSV  testing.  Fact Sheet for Patients: PinkCheek.be  Fact Sheet for Healthcare Providers: GravelBags.it  This test is not yet approved or cleared by the [place] FDA and  has been authorized for detection and/or diagnosis of SARS-CoV-2 by  FDA under an Emergency Use Authorization (EUA). This EUA will remain  in effect (meaning this test can be used) for the duration of the  Covid-19 declaration under Section 564(b)(1) of the Act, 21  U.S.C. section 360bbb-3(b)(1), unless the authorization is  terminated or revoked. Performed at [facility], [place] [address] [place], [place] [address]   Urine culture     Status: Abnormal   Collection Time: 03/18/20  2:32 AM   Specimen: In/Out Cath Urine  Result Value Ref Range Status   Specimen Description IN/OUT CATH URINE  Final   Special Requests   Final    NONE Performed at [facility], [place] [address] [place], [place] [address]    Culture MULTIPLE SPECIES PRESENT, SUGGEST RECOLLECTION (A)  Final   Report Status 03/19/2020 FINAL  Final  Blood Culture (routine x 2)     Status: None (Preliminary result)   Collection Time: 03/23/20  7:40 AM   Specimen: BLOOD  Result Value Ref Range Status   Specimen Description BLOOD RIGHT ANTECUBITAL  Final   Special Requests   Final    BOTTLES DRAWN AEROBIC AND ANAEROBIC Blood Culture results may not be optimal due to an inadequate volume of blood received in culture bottles   Culture   Final    NO GROWTH  2 DAYS Performed at [facility], [place] [address] [place], [place] [address]    Report Status PENDING  Incomplete  Blood Culture (routine x 2)     Status: None (Preliminary result)   Collection Time: 03/23/20  8:00 AM   Specimen: BLOOD  Result Value Ref Range Status   Specimen Description BLOOD LEFT ANTECUBITAL  Final   Special Requests   Final    BOTTLES DRAWN AEROBIC AND ANAEROBIC Blood Culture adequate volume   Culture   Final    NO GROWTH 2 DAYS Performed at [facility], [place] [address] [place], [place] [address]    Report Status PENDING  Incomplete  MRSA PCR Screening     Status: None   Collection Time: 03/24/20  6:54 PM   Specimen: Nasal Mucosa; Nasopharyngeal  Result Value Ref Range Status   MRSA by PCR NEGATIVE NEGATIVE Final    Comment:        The GeneXpert MRSA Assay (FDA approved for NASAL specimens only), is one component of a comprehensive MRSA colonization surveillance program. It is not intended to diagnose MRSA infection nor to guide or monitor treatment for MRSA infections. Performed at [facility], [place] [address], [place], [place] [address]       Radiology Studies: CT ANGIO CHEST PE W OR WO CONTRAST  Result Date: 03/23/2020 CLINICAL DATA:  Shortness of breath. COVID positive, recent hospitalization post discharge yesterday. EXAM: CT ANGIOGRAPHY CHEST WITH CONTRAST TECHNIQUE: Multidetector CT imaging of the chest was performed using the standard protocol during bolus administration of intravenous contrast. Multiplanar CT image reconstructions and MIPs were obtained to evaluate the vascular anatomy. CONTRAST:  145mL OMNIPAQUE IOHEXOL 350 MG/ML SOLN COMPARISON:  Radiograph earlier today.  Chest CTA 07/04/2013 FINDINGS: Cardiovascular: There are no filling defects within the pulmonary arteries to suggest pulmonary embolus. There is significant breathing motion artifact, evaluation is diagnostic to the proximal segmental level. Cannot assess  distal segmental and subsegmental branches. Thoracic aorta is normal in caliber. There is no aortic dissection. Moderate aortic atherosclerosis. Borderline cardiomegaly. There mitral annulus calcifications. Coronary artery calcifications. No pericardial effusion. Small amount of contrast refluxes into the hepatic veins and IVC. Mediastinum/Nodes: Small hiatal hernia. Small  paratracheal and right infrahilar nodes not enlarged by size criteria. No suspicious thyroid nodule. No pneumomediastinum. Lungs/Pleura: Multifocal patchy and geographic ground-glass opacities within all lobes of both lungs. Some of these opacities appear coalescing peripherally. No convincing septal thickening or pulmonary edema. No pneumothorax. Trachea and central bronchi are patent. No pleural fluid. The previous right lower lobe pulmonary nodule is obscured by opacities on the current exam. Upper Abdomen: Minimal contrast refluxing into the hepatic veins and IVC. Suspected hepatic steatosis. Cholecystectomy. Multiple cysts in the left kidney, incompletely characterized on this chest CTA, but grossly stable from 2015. Musculoskeletal: Multilevel degenerative change throughout the spine. There are no acute or suspicious osseous abnormalities. Review of the MIP images confirms the above findings. IMPRESSION: 1. No pulmonary embolus allowing for significant breathing motion artifact. 2. Multifocal patchy and geographic ground-glass opacities within all lobes of both lungs, pattern consistent with COVID-19 pneumonia. 3. Small amount of contrast refluxing into the hepatic veins and IVC, suggesting elevated right heart pressures. 4. Small hiatal hernia. 5. Aortic atherosclerosis and coronary artery calcifications. Aortic Atherosclerosis (ICD10-I70.0). Electronically Signed   By: Keith Rake M.D.   On: 03/23/2020 17:05   ECHOCARDIOGRAM COMPLETE  Result Date: 03/24/2020    ECHOCARDIOGRAM REPORT   Patient Name:   DEMARQUS JOCSON Date of  Exam: 03/24/2020 Medical Rec #:  [id number]         Height:       71.0 in Accession #:    [id number]        Weight:       324.7 lb Date of Birth:  07/03/1950         BSA:          2.591 m Patient Age:    45 years          BP:           140/101 mmHg Patient Gender: M                 HR:           115 bpm. Exam Location:  Inpatient Procedure: 2D Echo, Cardiac Doppler and Color Doppler Indications:     I42.9 Cardiomyopathy (unspecified)  History:         Patient has prior history of Echocardiogram examinations, most                  recent 05/06/2018. Risk Factors:Morbid Obesity. Covid 19                  positive.  Sonographer:     Merrie Roof RDCS Referring Phys:  [place] Diagnosing Phys: Carlyle Dolly MD IMPRESSIONS  1. Left ventricular ejection fraction, by estimation, is 55 to 60%. The left ventricle has normal function. The left ventricle has no regional wall motion abnormalities. Left ventricular diastolic parameters are indeterminate.  2. Right ventricular systolic function is normal. The right ventricular size is normal.  3. Left atrial size was mild to moderately dilated.  4. The mitral valve is normal in structure. No evidence of mitral valve regurgitation. No evidence of mitral stenosis.  5. The aortic valve has an indeterminant number of cusps. There is moderate calcification of the aortic valve. There is moderate thickening of the aortic valve. Aortic valve regurgitation is not visualized. Mild to moderate aortic valve stenosis.Aortic valve mean gradient measures 19.3 mmHg. Aortic valve peak gradient measures 32.1 mmHg. Aortic valve area, by VTI measures 1.26 cm. FINDINGS  Left Ventricle: Left ventricular  ejection fraction, by estimation, is 55 to 60%. The left ventricle has normal function. The left ventricle has no regional wall motion abnormalities. The left ventricular internal cavity size was normal in size. There is  no left ventricular hypertrophy. Left ventricular diastolic parameters  are indeterminate. Right Ventricle: The right ventricular size is normal. No increase in right ventricular wall thickness. Right ventricular systolic function is normal. Left Atrium: Left atrial size was mild to moderately dilated. Right Atrium: Right atrial size was normal in size. Pericardium: There is no evidence of pericardial effusion. Mitral Valve: The mitral valve is normal in structure. No evidence of mitral valve regurgitation. No evidence of mitral valve stenosis. Tricuspid Valve: The tricuspid valve is normal in structure. Tricuspid valve regurgitation is not demonstrated. No evidence of tricuspid stenosis. Aortic Valve: The aortic valve has an indeterminant number of cusps. There is moderate calcification of the aortic valve. There is moderate thickening of the aortic valve. There is moderate aortic valve annular calcification. Aortic valve regurgitation is not visualized. Mild to moderate aortic stenosis is present. Aortic valve mean gradient measures 19.3 mmHg. Aortic valve peak gradient measures 32.1 mmHg. Aortic valve area, by VTI measures 1.26 cm. Pulmonic Valve: The pulmonic valve was not well visualized. Pulmonic valve regurgitation is not visualized. No evidence of pulmonic stenosis. Aorta: The aortic root is normal in size and structure. Pulmonary Artery: Indeterminant PASP, inadequate TR jet. IAS/Shunts: The interatrial septum was not well visualized.  LEFT VENTRICLE PLAX 2D LVIDd:         4.70 cm LVIDs:         3.60 cm LV PW:         1.00 cm LV IVS:        0.90 cm LVOT diam:     2.10 cm LV SV:         64 LV SV Index:   25 LVOT Area:     3.46 cm  LV Volumes (MOD) LV vol d, MOD A4C: 86.8 ml LV vol s, MOD A4C: 46.1 ml LV SV MOD A4C:     86.8 ml RIGHT VENTRICLE RV Basal diam:  4.10 cm RV Mid diam:    3.40 cm RV S prime:     14.70 cm/s TAPSE (M-mode): 1.8 cm LEFT ATRIUM            Index       RIGHT ATRIUM           Index LA diam:      4.90 cm  1.89 cm/m  RA Area:     23.50 cm LA Vol (A2C):  103.0 ml 39.75 ml/m RA Volume:   60.10 ml  23.20 ml/m LA Vol (A4C): 82.2 ml  31.73 ml/m  AORTIC VALVE AV Area (Vmax):    1.34 cm AV Area (Vmean):   1.21 cm AV Area (VTI):     1.26 cm AV Vmax:           283.33 cm/s AV Vmean:          208.667 cm/s AV VTI:            0.505 m AV Peak Grad:      32.1 mmHg AV Mean Grad:      19.3 mmHg LVOT Vmax:         110.00 cm/s LVOT Vmean:        73.100 cm/s LVOT VTI:          0.184 m LVOT/AV VTI ratio: 0.36  AORTA  Ao Root diam: 3.80 cm  SHUNTS Systemic VTI:  0.18 m Systemic Diam: 2.10 cm Carlyle Dolly MD Electronically signed by Carlyle Dolly MD Signature Date/Time: 03/24/2020/3:47:49 PM    Final (Updated)        LOS: 2 days   [facility] Pager on [email or web address]  03/25/2020, 12:11 PM

## 2020-03-25 NOTE — Plan of Care (Signed)

## 2020-03-26 ENCOUNTER — Telehealth: Payer: Self-pay | Admitting: Cardiology

## 2020-03-26 ENCOUNTER — Ambulatory Visit: Payer: MEDICARE | Admitting: Cardiology

## 2020-03-26 DIAGNOSIS — U071 COVID-19: Secondary | ICD-10-CM | POA: Diagnosis not present

## 2020-03-26 DIAGNOSIS — R7401 Elevation of levels of liver transaminase levels: Secondary | ICD-10-CM | POA: Diagnosis not present

## 2020-03-26 DIAGNOSIS — J9601 Acute respiratory failure with hypoxia: Secondary | ICD-10-CM | POA: Diagnosis not present

## 2020-03-26 DIAGNOSIS — I10 Essential (primary) hypertension: Secondary | ICD-10-CM | POA: Diagnosis not present

## 2020-03-26 LAB — CBC WITH DIFFERENTIAL/PLATELET
Abs Immature Granulocytes: 0.46 10*3/uL — ABNORMAL HIGH (ref 0.00–0.07)
Basophils Absolute: 0.1 10*3/uL (ref 0.0–0.1)
Basophils Relative: 0 %
Eosinophils Absolute: 0 10*3/uL (ref 0.0–0.5)
Eosinophils Relative: 0 %
HCT: 45.9 % (ref 39.0–52.0)
Hemoglobin: 15.7 g/dL (ref 13.0–17.0)
Immature Granulocytes: 3 %
Lymphocytes Relative: 3 %
Lymphs Abs: 0.5 10*3/uL — ABNORMAL LOW (ref 0.7–4.0)
MCH: 32.2 pg (ref 26.0–34.0)
MCHC: 34.2 g/dL (ref 30.0–36.0)
MCV: 94.1 fL (ref 80.0–100.0)
Monocytes Absolute: 0.6 10*3/uL (ref 0.1–1.0)
Monocytes Relative: 3 %
Neutro Abs: 16.1 10*3/uL — ABNORMAL HIGH (ref 1.7–7.7)
Neutrophils Relative %: 91 %
Platelets: 268 10*3/uL (ref 150–400)
RBC: 4.88 MIL/uL (ref 4.22–5.81)
RDW: 12.6 % (ref 11.5–15.5)
WBC: 17.7 10*3/uL — ABNORMAL HIGH (ref 4.0–10.5)
nRBC: 0 % (ref 0.0–0.2)

## 2020-03-26 LAB — COMPREHENSIVE METABOLIC PANEL
ALT: 81 U/L — ABNORMAL HIGH (ref 0–44)
AST: 24 U/L (ref 15–41)
Albumin: 2.6 g/dL — ABNORMAL LOW (ref 3.5–5.0)
Alkaline Phosphatase: 54 U/L (ref 38–126)
Anion gap: 12 (ref 5–15)
BUN: 33 mg/dL — ABNORMAL HIGH (ref 8–23)
CO2: 26 mmol/L (ref 22–32)
Calcium: 8.6 mg/dL — ABNORMAL LOW (ref 8.9–10.3)
Chloride: 99 mmol/L (ref 98–111)
Creatinine, Ser: 1.2 mg/dL (ref 0.61–1.24)
GFR calc Af Amer: >60 mL/min (ref >60)
GFR calc non Af Amer: >60 mL/min (ref >60)
Glucose, Bld: 195 mg/dL — ABNORMAL HIGH (ref 70–99)
Potassium: 4 mmol/L (ref 3.5–5.1)
Sodium: 137 mmol/L (ref 135–145)
Total Bilirubin: 1.2 mg/dL (ref 0.3–1.2)
Total Protein: 6.6 g/dL (ref 6.5–8.1)

## 2020-03-26 LAB — MAGNESIUM: Magnesium: 2.3 mg/dL (ref 1.7–2.4)

## 2020-03-26 LAB — TSH: TSH: 1.169 u[IU]/mL (ref 0.350–4.500)

## 2020-03-26 LAB — PROTIME-INR
INR: 3.2 — ABNORMAL HIGH (ref 0.8–1.2)
Prothrombin Time: 31.8 seconds — ABNORMAL HIGH (ref 11.4–15.2)

## 2020-03-26 LAB — C-REACTIVE PROTEIN: CRP: 4 mg/dL — ABNORMAL HIGH (ref <1.0)

## 2020-03-26 LAB — D-DIMER, QUANTITATIVE: D-Dimer, Quant: >20 ug/mL-FEU — ABNORMAL HIGH (ref 0.00–0.50)

## 2020-03-26 MED ORDER — FUROSEMIDE 10 MG/ML IJ SOLN
40.0000 mg | Freq: Once | INTRAMUSCULAR | Status: AC
  Administered 2020-03-26: 40 mg via INTRAVENOUS
  Filled 2020-03-26: qty 4

## 2020-03-26 MED ORDER — ALPRAZOLAM 0.25 MG PO TABS
0.2500 mg | ORAL_TABLET | Freq: Three times a day (TID) | ORAL | Status: DC | PRN
  Administered 2020-03-26 – 2020-03-30 (×5): 0.25 mg via ORAL
  Filled 2020-03-26 (×5): qty 1

## 2020-03-26 MED ORDER — NORTRIPTYLINE HCL 25 MG PO CAPS
25.0000 mg | ORAL_CAPSULE | Freq: Every evening | ORAL | Status: DC
  Administered 2020-03-26 – 2020-04-03 (×9): 25 mg via ORAL
  Filled 2020-03-26 (×10): qty 1

## 2020-03-26 MED ORDER — ACETAMINOPHEN 325 MG PO TABS
650.0000 mg | ORAL_TABLET | Freq: Four times a day (QID) | ORAL | Status: DC | PRN
  Administered 2020-03-26: 650 mg via ORAL
  Filled 2020-03-26: qty 2

## 2020-03-26 NOTE — Evaluation (Signed)
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  He was down to about 3 L of oxygen.  He was discharged with home oxygen.  Apparently became more short of breath at home.  Called EMS.  Oxygen requirements were higher.  He was subsequently hospitalized for further management.  Clinical Impression   Pt admitted with above diagnosis. Comes from home where he did not stay long (was home less than a day per pt), after recent dc from hospital; Lives in a house with a few steps to enter; Prior to this recent previous hospitalization, he was working on walking distance, and walking without assistive device; Present to PT with significant decr functional capacity, effecting activity tolerance;  On 15 L supplemental O2 via HFNC; O2 sats dropped as low as 84% with transfer/upright activity; Recovered with about 3-5 minustes of seated rest to 90s; HR tachy and reached 150 bpm (highest observed), and typicall stayed in 120s-130s majority of session; Pt currently with functional limitations due to the deficits listed below (see PT Problem List). Pt will benefit from skilled PT to increase their independence and safety with mobility to allow discharge to the venue listed below.       Follow Up Recommendations No PT follow up    Equipment Recommendations  Rolling walker with 5" wheels;Other (comment) (Consider bari Rollator)    Recommendations for Other Services       Precautions / Restrictions Precautions Precautions: Other (comment) Precaution Comments: Watch SpO2 and HR      Mobility  Bed Mobility               General bed mobility comments: Received sitting in  recliner  Transfers Overall transfer level: Needs assistance Equipment used: None Transfers: Sit to/from Stand Sit to Stand: Min guard         General transfer comment: Notable dependence on UEs for push off, but able to stand without physical assist; cues to self-monitor fro activity tolerance  Ambulation/Gait             General Gait Details: HR to 150 with simple sit<>stands, so held further activity at this time  Stairs            Wheelchair Mobility    Modified Rankin (Stroke Patients Only)       Balance     Sitting balance-Leahy Scale: Good       Standing balance-Leahy Scale: Fair                               Pertinent Vitals/Pain Pain Assessment: No/denies pain    Home Living Family/patient expects to be discharged to:: Private residence Living Arrangements: Spouse/significant other;Children Available Help at Discharge: Family;Available 24 hours/day Type of Home: House Home Access: Stairs to enter Entrance Stairs-Rails: Psychiatric nurse of Steps: 4-6 Home Layout: One level Home Equipment: Clinical cytogeneticist - 2 wheels;Cane - single point;Wheelchair - manual;Transport chair;Hospital bed      Prior Function Level of Independence: Independent         Comments: Prior to most recent hospitalization, Up to walking 3/4 mile with walking sticks due to h/o back issues with BLE numbness/weakness  Hand Dominance   Dominant Hand: Right    Extremity/Trunk Assessment   Upper Extremity Assessment Upper Extremity Assessment: Overall WFL for tasks assessed    Lower Extremity Assessment Lower Extremity Assessment: Generalized weakness    Cervical / Trunk Assessment Cervical / Trunk Assessment: Normal  Communication   Communication: No difficulties;Other (comment) (Noting some O2 desaturation with talking)  Cognition Arousal/Alertness: Awake/alert Behavior During Therapy: WFL for tasks  assessed/performed Overall Cognitive Status: Within Functional Limits for tasks assessed                                        General Comments General comments (skin integrity, edema, etc.): On 15 L supplemental O2 via HFNC; O2 sats dropped as low as 84% with transfer/upright activity; Recovered with about 3-5 minustes of seated rest to 90s; HR tachy and reached 150 bpm (highest observed), and typicall stayed in 120s-130s majority of session    Exercises     Assessment/Plan    PT Assessment Patient needs continued PT services  PT Problem List Decreased strength;Decreased activity tolerance;Decreased balance;Decreased mobility;Cardiopulmonary status limiting activity;Obesity       PT Treatment Interventions DME instruction;Gait training;Stair training;Functional mobility training;Therapeutic activities;Therapeutic exercise;Balance training;Patient/family education    PT Goals (Current goals can be found in the Care Plan section)  Acute Rehab PT Goals Patient Stated Goal: "get better" PT Goal Formulation: With patient Time For Goal Achievement: 04/09/20 Potential to Achieve Goals: Good    Frequency Min 3X/week   Barriers to discharge        Co-evaluation               AM-PAC PT "6 Clicks" Mobility  Outcome Measure Help needed turning from your back to your side while in a flat bed without using bedrails?: None Help needed moving from lying on your back to sitting on the side of a flat bed without using bedrails?: None Help needed moving to and from a bed to a chair (including a wheelchair)?: None Help needed standing up from a chair using your arms (e.g., wheelchair or bedside chair)?: None Help needed to walk in hospital room?: A Little Help needed climbing 3-5 steps with a railing? : A Little 6 Click Score: 22    End of Session Equipment Utilized During Treatment: Oxygen Activity Tolerance: Other (comment) (limited by tachycardia) Patient left:  in chair;with call bell/phone within reach;with nursing/sitter in room Nurse Communication: Mobility status PT Visit Diagnosis: Other abnormalities of gait and mobility (R26.89)    Time: 3474-2595 PT Time Calculation (min) (ACUTE ONLY): 22 min   Charges:   PT Evaluation $PT Eval Moderate Complexity: 1 Mod          Roney Marion, PT  Acute Rehabilitation Services Pager [phone] Office [phone]   [place] 03/26/2020, 2:23 PM

## 2020-03-26 NOTE — Progress Notes (Addendum)
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  He was down to about 3 L of oxygen.  He was discharged with home oxygen.  Apparently became more short of breath at home.  Called EMS.  Oxygen requirements were higher.  He was subsequently hospitalized for further management.  Reason for Visit: Pneumonia due to COVID-19.  Acute respiratory failure with hypoxia  Consultants: None  Procedures: None  Antibiotics: Anti-infectives (From admission, onward)   None      Subjective/Interval History: Patient noted to be quite anxious and upset this morning.  He subsequently calmed down.  He denies any worsening in his shortness of breath.  Continues to have a dry cough.  No chest pain.  Denies any nausea or vomiting.    Assessment/Plan:  Acute Hypoxic Resp. Failure/Pneumonia due to COVID-19   Recent Labs  Lab 03/20/20 0615 03/20/20 0615 03/21/20 0909 03/21/20 0909 03/22/20 1118 03/22/20 1118 03/23/20 0703 03/23/20 0900 03/23/20 1647 03/24/20 0500 03/25/20 0500 03/26/20 0444  DDIMER  --   --  <0.27   < > 0.63*   < >  --  1.75* 2.74* 17.44* >20.00* >20.00*  FERRITIN 1,014*  --  1,224*  --   --   --  1,502*  --   --  1,648*  --   --   CRP 1.7*   < > 0.8   < > 0.7  --  0.9  --   --  10.7* 8.7* 4.0*  ALT 48*   < > 155*   < > 224*  --  203*  --   --  150* 107* 81*  PROCALCITON  --   --   --   --   --   --  <0.10  --  0.11  --   --   --    < > = values in this interval not displayed.    Objective findings: Oxygen requirements: Remains on high flow nasal cannula at 15 L/min.  Saturating in the early to mid 32s.    COVID 19 Therapeutics: Antibacterials: None.  Procalcitonin  0.11 Remdesivir: Recently completed a 5-day course Steroids: Solu-Medrol Diuretics: Lasix was given yesterday.  We will repeat a dose today. Inhaled Steroids: Dulera Baricitinib: Initially started on September 26.  Resumed on 10/1 after discussion of risks and benefits. PUD Prophylaxis: Pepcid DVT Prophylaxis: Therapeutic Lovenox currently.  On warfarin prior to admission.  Patient had to be rehospitalized due to worsening respiratory symptoms.  From a respiratory standpoint patient remains tenuous.  He seems to be slightly better today compared to yesterday with improvement in oxygen saturations.  Should be able to wean down his oxygen slowly.  Recently completed a 5-day course of Remdesivir.  He remains on Solu-Medrol.  Remains on baricitinib.  CRP is improved.  We will repeat dose of Lasix today.  Continue prone positioning, incentive spirometry and mobilization.  Will also involve PT and OT.  D-dimer remains greater than 20.  CT angiogram at the time of admission did not show any PE.  Lower extremity Doppler study negative for DVT.  Continue therapeutic Lovenox for now.  The treatment plan and use of medications and known side effects were discussed with patient/family. Some of the medications used are  based on case reports/anecdotal data.  All other medications being used in the management of COVID-19 based on limited study data.  Complete risks and long-term side effects are unknown, however in the best clinical judgment they seem to be of some benefit.  Patient/family wanted to proceed with treatment options provided.  Transaminitis Most likely due to COVID-19.  Trend has been improving.  Essential hypertension Blood pressure is reasonably well controlled.  Continue to monitor.  Significant lower extremity edema Echocardiogram showed normal systolic function without any significant valvular abnormalities.  Lower extremity Doppler study negative for DVT.  Repeat another dose of Lasix  today.  Keep legs elevated as much as possible.  History of MAT/SVT/irregular heart rhythm Telemetry shows irregular heart rhythm.  Being read as atrial fibrillation.  Patient does have a history of MAT and SVT but no previously documented history of atrial fibrillation..  We will reorder.  Continue telemetry monitoring.  TSH 1.16.   Looks like he has been seen by Dr. Harrell Gave previously with cardiology.  Not noted to be on any AV blocking medications.  Looks like he had refused medications at last visit.  Patient reports "allergy" to multiple cardiac medications.  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  See discussion above as well.  D-dimer noted to be significantly elevated.  CT angiogram was negative.  Continue therapeutic Lovenox.  Lower extremity Dopplers negative for DVT. It was brought to my attention this afternoon that patient has not been on lovenox. Unclear what transpired. Will order it today.   History of bronchial asthma Stable.  No wheezing appreciated at this time.  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  Holding his prednisone while he is getting Solu-Medrol.  Recent acute metabolic encephalopathy Apparently was confused during his previous hospitalization.  Seems to be stable currently.  Mentating normally.  Continue to monitor.  Situational anxiety Patient also noted to have crying episodes.  Noted to be on Pamelor.  Consider increasing dose.  May also need to add anxiolytic.  Morbid obesity Estimated body mass index is 45.29 kg/m as calculated from the following:   Height as of 03/17/20: 5\' 11"  (1.803 m).   Weight as of 03/18/20: 147.3 kg.   DVT Prophylaxis:  full dose Lovenox Code Status: Limited resuscitation: No intubation but all other interventions are fine.   Family Communication: Wife being updated daily.   Disposition Plan: Hopefully return home when  improved  Status is: Inpatient  Remains inpatient appropriate because:IV treatments appropriate due to intensity of illness or inability to take PO and Inpatient level of care appropriate due to severity of illness   Dispo:  Patient From: Home  Planned Disposition: Home  Expected discharge date: 03/28/20  Medically stable for discharge: No        Medications:  Scheduled:  baricitinib  4 mg Oral Daily   famotidine  20 mg Oral Daily   methylPREDNISolone (SOLU-MEDROL) injection  80 mg Intravenous Q12H   mometasone-formoterol  2 puff Inhalation BID   nortriptyline  10 mg Oral QPM   sodium chloride flush  3 mL Intravenous Q12H   Continuous:  LZJ:QBHALPFXT, benzonatate   Objective:  Vital Signs  Vitals:   03/26/20 0032 03/26/20 0444 03/26/20 0748 03/26/20 0801  BP: 131/74 128/87 (!) 123/91   Pulse: 98 92 94   Resp: 20 (!) 22 20   Temp: 97.7 F (36.5 C) 97.6 F (36.4 C) 97.7 F (  36.5 C)   TempSrc: Oral Oral Oral   SpO2: 97% 95% 98% 93%    Intake/Output Summary (Last 24 hours) at 03/26/2020 1021 Last data filed at 03/26/2020 0848 Gross per 24 hour  Intake 240 ml  Output 800 ml  Net -560 ml   There were no vitals filed for this visit.   General appearance: Awake alert.  In no distress Resp: Tachypneic but no use of accessory muscles noted.  Crackles bilateral bases.  No wheezing or rhonchi. Cardio: S1-S2 is normal regular.  No S3-S4.  No rubs murmurs or bruit GI: Abdomen is soft.  Nontender nondistended.  Bowel sounds are present normal.  No masses organomegaly Extremities: No edema.  Full range of motion of lower extremities. Neurologic: Alert and oriented x3.  No focal neurological deficits.     Lab Results:  Data Reviewed: I have personally reviewed following labs and imaging studies  CBC: Recent Labs  Lab 03/22/20 1118 03/22/20 1118 03/23/20 0703 03/23/20 0703 03/23/20 1647 03/23/20 1757 03/24/20 0500 03/25/20 0500 03/26/20 0444  WBC  11.4*   < > 13.3*  --  12.5*  --  14.2* 13.5* 17.7*  NEUTROABS 9.7*  --  10.4*  --   --   --  12.3* 12.1* 16.1*  HGB 15.9   < > 15.6   < > 15.3 14.3 15.4 15.7 15.7  HCT 46.8   < > 45.5   < > 45.6 42.0 45.6 46.0 45.9  MCV 93.6   < > 93.4  --  95.2  --  94.2 94.3 94.1  PLT 245   < > 283  --  259  --  253 273 268   < > = values in this interval not displayed.    Basic Metabolic Panel: Recent Labs  Lab 03/22/20 1118 03/22/20 1118 03/23/20 0703 03/23/20 1647 03/23/20 1757 03/24/20 0500 03/25/20 0500 03/26/20 0444  NA 136   < > 138  --  138 139 137 137  K 3.6   < > 3.6  --  4.3 4.5 4.3 4.0  CL 98  --  102  --   --  102 99 99  CO2 25  --  24  --   --  27 28 26   GLUCOSE 223*  --  97  --   --  143* 180* 195*  BUN 28*  --  24*  --   --  17 23 33*  CREATININE 1.14   < > 1.07 1.08  --  0.94 1.00 1.20  CALCIUM 8.5*  --  8.3*  --   --  8.4* 8.4* 8.6*  MG  --   --   --   --   --  2.2 2.2 2.3  PHOS  --   --   --   --   --  3.5  --   --    < > = values in this interval not displayed.    GFR: Estimated Creatinine Clearance: 86.8 mL/min (by C-G formula based on SCr of 1.2 mg/dL).  Liver Function Tests: Recent Labs  Lab 03/22/20 1118 03/23/20 0703 03/24/20 0500 03/25/20 0500 03/26/20 0444  AST 137* 101* 55* 31 24  ALT 224* 203* 150* 107* 81*  ALKPHOS 43 48 49 55 54  BILITOT 1.1 1.7* 1.3* 1.4* 1.2  PROT 6.7 6.6 6.3* 6.8 6.6  ALBUMIN 2.9* 2.8* 2.5* 2.5* 2.6*    Coagulation Profile: Recent Labs  Lab 03/20/20 0615 03/21/20 0909 03/22/20 1118 03/24/20 0500  INR 2.0* 2.6* 2.7* 3.2*    Anemia Panel: Recent Labs    03/24/20 0500  FERRITIN 1,648*    Recent Results (from the past 240 hour(s))  Blood Culture (routine x 2)     Status: None   Collection Time: 03/18/20 12:11 AM   Specimen: BLOOD  Result Value Ref Range Status   Specimen Description BLOOD RIGHT ARM  Final   Special Requests   Final    BOTTLES DRAWN AEROBIC AND ANAEROBIC Blood Culture adequate volume    Culture   Final    NO GROWTH 5 DAYS Performed at [facility], [address] [place], [place] [address]    Report Status 03/23/2020 FINAL  Final  Blood Culture (routine x 2)     Status: None   Collection Time: 03/18/20 12:11 AM   Specimen: BLOOD  Result Value Ref Range Status   Specimen Description BLOOD LEFT HAND  Final   Special Requests   Final    BOTTLES DRAWN AEROBIC AND ANAEROBIC Blood Culture results may not be optimal due to an inadequate volume of blood received in culture bottles   Culture   Final    NO GROWTH 5 DAYS Performed at [facility], [place] [address], [place], [place] [address]    Report Status 03/23/2020 FINAL  Final  Respiratory Panel by RT PCR (Flu A&B, Covid) - Nasopharyngeal Swab     Status: Abnormal   Collection Time: 03/18/20 12:11 AM   Specimen: Nasopharyngeal Swab  Result Value Ref Range Status   SARS Coronavirus 2 by RT PCR POSITIVE (A) NEGATIVE Final    Comment: RESULT CALLED TO, READ BACK BY AND VERIFIED WITH: L. VENEGAS,RN [id number] 03/18/2020 T. TYSOR (NOTE) SARS-CoV-2 target nucleic acids are DETECTED.  SARS-CoV-2 RNA is generally detectable in upper respiratory specimens  during the acute phase of infection. Positive results are indicative of the presence of the identified virus, but do not rule out bacterial infection or co-infection with other pathogens not detected by the test. Clinical correlation with patient history and other diagnostic information is necessary to determine patient infection status. The expected result is Negative.  Fact Sheet for Patients:  PinkCheek.be  Fact Sheet for Healthcare Providers: GravelBags.it  This test is not yet approved or cleared by the [place] FDA and  has been authorized for detection and/or diagnosis of SARS-CoV-2 by FDA under an Emergency Use Authorization (EUA).  This EUA will remain in effect (meaning this test can  be  used) for the duration of  the COVID-19 declaration under Section 564(b)(1) of the Act, 21 U.S.C. section 360bbb-3(b)(1), unless the authorization is terminated or revoked sooner.      Influenza A by PCR NEGATIVE NEGATIVE Final   Influenza B by PCR NEGATIVE NEGATIVE Final    Comment: (NOTE) The Xpert Xpress SARS-CoV-2/FLU/RSV assay is intended as an aid in  the diagnosis of influenza from Nasopharyngeal swab specimens and  should not be used as a sole basis for treatment. Nasal washings and  aspirates are unacceptable for Xpert Xpress SARS-CoV-2/FLU/RSV  testing.  Fact Sheet for Patients: PinkCheek.be  Fact Sheet for Healthcare Providers: GravelBags.it  This test is not yet approved or cleared by the [place] FDA and  has been authorized for detection and/or diagnosis of SARS-CoV-2 by  FDA under an Emergency Use Authorization (EUA). This EUA will remain  in effect (meaning this test can be used) for the duration of the  Covid-19 declaration under Section 564(b)(1) of  the Act, 21  U.S.C. section 360bbb-3(b)(1), unless the authorization is  terminated or revoked. Performed at [facility], [place] [address] [place], [place] [address]   Urine culture     Status: Abnormal   Collection Time: 03/18/20  2:32 AM   Specimen: In/Out Cath Urine  Result Value Ref Range Status   Specimen Description IN/OUT CATH URINE  Final   Special Requests   Final    NONE Performed at [facility], [place] [address] [place], [place] [address]    Culture MULTIPLE SPECIES PRESENT, SUGGEST RECOLLECTION (A)  Final   Report Status 03/19/2020 FINAL  Final  Blood Culture (routine x 2)     Status: None (Preliminary result)   Collection Time: 03/23/20  7:40 AM   Specimen: BLOOD  Result Value Ref Range Status   Specimen Description BLOOD RIGHT ANTECUBITAL  Final   Special Requests   Final    BOTTLES DRAWN AEROBIC AND ANAEROBIC Blood  Culture results may not be optimal due to an inadequate volume of blood received in culture bottles   Culture   Final    NO GROWTH 2 DAYS Performed at [facility], [place] [address] [place], [place] [address]    Report Status PENDING  Incomplete  Blood Culture (routine x 2)     Status: None (Preliminary result)   Collection Time: 03/23/20  8:00 AM   Specimen: BLOOD  Result Value Ref Range Status   Specimen Description BLOOD LEFT ANTECUBITAL  Final   Special Requests   Final    BOTTLES DRAWN AEROBIC AND ANAEROBIC Blood Culture adequate volume   Culture   Final    NO GROWTH 2 DAYS Performed at [facility], [place] [address] [place], [place] [address]    Report Status PENDING  Incomplete  MRSA PCR Screening     Status: None   Collection Time: 03/24/20  6:54 PM   Specimen: Nasal Mucosa; Nasopharyngeal  Result Value Ref Range Status   MRSA by PCR NEGATIVE NEGATIVE Final    Comment:        The GeneXpert MRSA Assay (FDA approved for NASAL specimens only), is one component of a comprehensive MRSA colonization surveillance program. It is not intended to diagnose MRSA infection nor to guide or monitor treatment for MRSA infections. Performed at [facility], [place] [address], [place],  [address]       Radiology Studies: ECHOCARDIOGRAM COMPLETE  Result Date: 03/24/2020    ECHOCARDIOGRAM REPORT   Patient Name:   Robert [name] Date of Exam: 03/24/2020 Medical Rec #:  [id number]         Height:       71.0 in Accession #:    [id number]        Weight:       324.7 lb Date of Birth:  October 12, 1950         BSA:          2.591 m Patient Age:    24 years          BP:           140/101 mmHg Patient Gender: M                 HR:           115 bpm. Exam Location:  Inpatient Procedure: 2D Echo, Cardiac Doppler and Color Doppler Indications:     I42.9 Cardiomyopathy (unspecified)  History:  Patient has prior history of Echocardiogram examinations, most                   recent 05/06/2018. Risk Factors:Morbid Obesity. Covid 19                  positive.  Sonographer:     Merrie Roof RDCS Referring Phys:  Bertram Diagnosing Phys: Carlyle Dolly MD IMPRESSIONS  1. Left ventricular ejection fraction, by estimation, is 55 to 60%. The left ventricle has normal function. The left ventricle has no regional wall motion abnormalities. Left ventricular diastolic parameters are indeterminate.  2. Right ventricular systolic function is normal. The right ventricular size is normal.  3. Left atrial size was mild to moderately dilated.  4. The mitral valve is normal in structure. No evidence of mitral valve regurgitation. No evidence of mitral stenosis.  5. The aortic valve has an indeterminant number of cusps. There is moderate calcification of the aortic valve. There is moderate thickening of the aortic valve. Aortic valve regurgitation is not visualized. Mild to moderate aortic valve stenosis.Aortic valve mean gradient measures 19.3 mmHg. Aortic valve peak gradient measures 32.1 mmHg. Aortic valve area, by VTI measures 1.26 cm. FINDINGS  Left Ventricle: Left ventricular ejection fraction, by estimation, is 55 to 60%. The left ventricle has normal function. The left ventricle has no regional wall motion abnormalities. The left ventricular internal cavity size was normal in size. There is  no left ventricular hypertrophy. Left ventricular diastolic parameters are indeterminate. Right Ventricle: The right ventricular size is normal. No increase in right ventricular wall thickness. Right ventricular systolic function is normal. Left Atrium: Left atrial size was mild to moderately dilated. Right Atrium: Right atrial size was normal in size. Pericardium: There is no evidence of pericardial effusion. Mitral Valve: The mitral valve is normal in structure. No evidence of mitral valve regurgitation. No evidence of mitral valve stenosis. Tricuspid Valve: The tricuspid valve is normal in  structure. Tricuspid valve regurgitation is not demonstrated. No evidence of tricuspid stenosis. Aortic Valve: The aortic valve has an indeterminant number of cusps. There is moderate calcification of the aortic valve. There is moderate thickening of the aortic valve. There is moderate aortic valve annular calcification. Aortic valve regurgitation is not visualized. Mild to moderate aortic stenosis is present. Aortic valve mean gradient measures 19.3 mmHg. Aortic valve peak gradient measures 32.1 mmHg. Aortic valve area, by VTI measures 1.26 cm. Pulmonic Valve: The pulmonic valve was not well visualized. Pulmonic valve regurgitation is not visualized. No evidence of pulmonic stenosis. Aorta: The aortic root is normal in size and structure. Pulmonary Artery: Indeterminant PASP, inadequate TR jet. IAS/Shunts: The interatrial septum was not well visualized.  LEFT VENTRICLE PLAX 2D LVIDd:         4.70 cm LVIDs:         3.60 cm LV PW:         1.00 cm LV IVS:        0.90 cm LVOT diam:     2.10 cm LV SV:         64 LV SV Index:   25 LVOT Area:     3.46 cm  LV Volumes (MOD) LV vol d, MOD A4C: 86.8 ml LV vol s, MOD A4C: 46.1 ml LV SV MOD A4C:     86.8 ml RIGHT VENTRICLE RV Basal diam:  4.10 cm RV Mid diam:    3.40 cm RV S prime:     14.70  cm/s TAPSE (M-mode): 1.8 cm LEFT ATRIUM            Index       RIGHT ATRIUM           Index LA diam:      4.90 cm  1.89 cm/m  RA Area:     23.50 cm LA Vol (A2C): 103.0 ml 39.75 ml/m RA Volume:   60.10 ml  23.20 ml/m LA Vol (A4C): 82.2 ml  31.73 ml/m  AORTIC VALVE AV Area (Vmax):    1.34 cm AV Area (Vmean):   1.21 cm AV Area (VTI):     1.26 cm AV Vmax:           283.33 cm/s AV Vmean:          208.667 cm/s AV VTI:            0.505 m AV Peak Grad:      32.1 mmHg AV Mean Grad:      19.3 mmHg LVOT Vmax:         110.00 cm/s LVOT Vmean:        73.100 cm/s LVOT VTI:          0.184 m LVOT/AV VTI ratio: 0.36  AORTA Ao Root diam: 3.80 cm  SHUNTS Systemic VTI:  0.18 m Systemic Diam: 2.10 cm  Carlyle Dolly MD Electronically signed by Carlyle Dolly MD Signature Date/Time: 03/24/2020/3:47:49 PM    Final (Updated)    VAS [place] LOWER EXTREMITY VENOUS (DVT)  Result Date: 03/25/2020  Lower Venous DVTStudy Indications: Swelling, and eleavted d-dimer.  Risk Factors: History of extensive DVT RT in 2003. Anticoagulation: Coumadin. Limitations: Body habitus. Comparison Study: 05-07-2013 Prior RT lower venous available. Performing Technologist: Darlin Coco  Examination Guidelines: A complete evaluation includes B-mode imaging, spectral Doppler, color Doppler, and power Doppler as needed of all accessible portions of each vessel. Bilateral testing is considered an integral part of a complete examination. Limited examinations for reoccurring indications may be performed as noted. The reflux portion of the exam is performed with the patient in reverse Trendelenburg.  +---------+---------------+---------+-----------+----------+----------------+  RIGHT     Compressibility Phasicity Spontaneity Properties Thrombus Aging    +---------+---------------+---------+-----------+----------+----------------+  CFV       Full            Yes       Yes                                      +---------+---------------+---------+-----------+----------+----------------+  SFJ       Full                                                               +---------+---------------+---------+-----------+----------+----------------+  FV Prox   Full                                             Fibrin stranding  +---------+---------------+---------+-----------+----------+----------------+  FV Mid    Full                                                               +---------+---------------+---------+-----------+----------+----------------+  FV Distal Full                                                               +---------+---------------+---------+-----------+----------+----------------+  PFV       Full                                                                +---------+---------------+---------+-----------+----------+----------------+  POP       Full            Yes       Yes                    Fibrin stranding  +---------+---------------+---------+-----------+----------+----------------+  PTV       Full                                                               +---------+---------------+---------+-----------+----------+----------------+  PERO                                                       Not visualized    +---------+---------------+---------+-----------+----------+----------------+  Gastroc   Full                                                               +---------+---------------+---------+-----------+----------+----------------+   +---------+---------------+---------+-----------+----------+--------------+  LEFT      Compressibility Phasicity Spontaneity Properties Thrombus Aging  +---------+---------------+---------+-----------+----------+--------------+  CFV       Full            Yes       Yes                                    +---------+---------------+---------+-----------+----------+--------------+  SFJ       Full                                                             +---------+---------------+---------+-----------+----------+--------------+  FV Prox   Full                                                             +---------+---------------+---------+-----------+----------+--------------+  FV Mid    Full                                                             +---------+---------------+---------+-----------+----------+--------------+  FV Distal Full                                                             +---------+---------------+---------+-----------+----------+--------------+  PFV       Full                                                             +---------+---------------+---------+-----------+----------+--------------+  POP       Full            Yes       Yes                                     +---------+---------------+---------+-----------+----------+--------------+  PTV       Full                                                             +---------+---------------+---------+-----------+----------+--------------+  PERO      Full                                                             +---------+---------------+---------+-----------+----------+--------------+     Summary: RIGHT: - There is no evidence of deep vein thrombosis in the lower extremity. However, portions of this examination were limited- see technologist comments above.  - No cystic structure found in the popliteal fossa.  LEFT: - There is no evidence of deep vein thrombosis in the lower extremity.  - No cystic structure found in the popliteal fossa.  *See table(s) above for measurements and observations. Electronically signed by Harold Barban MD on 03/25/2020 at 7:42:06 PM.    Final        LOS: 3 days   [facility] Pager on [email or web address]  03/26/2020, 10:21 AM

## 2020-03-26 NOTE — Telephone Encounter (Signed)
Patient has been admitted to the hospital

## 2020-03-26 NOTE — Plan of Care (Signed)
  Problem: Education: Goal: Knowledge of General Education information will improve Description: Including pain rating scale, medication(s)/side effects and non-pharmacologic comfort measures Outcome: Progressing   Problem: Health Behavior/Discharge Planning: Goal: Ability to manage health-related needs will improve Outcome: Progressing   Problem: Clinical Measurements: Goal: Ability to maintain clinical measurements within normal limits will improve Outcome: Progressing Goal: Will remain free from infection Outcome: Progressing Goal: Diagnostic test results will improve Outcome: Progressing Goal: Respiratory complications will improve Outcome: Progressing Goal: Cardiovascular complication will be avoided Outcome: Progressing   Problem: Activity: Goal: Risk for activity intolerance will decrease Outcome: Progressing   Problem: Nutrition: Goal: Adequate nutrition will be maintained Outcome: Progressing   Problem: Elimination: Goal: Will not experience complications related to bowel motility Outcome: Progressing   Problem: Pain Managment: Goal: General experience of comfort will improve Outcome: Progressing   Problem: Safety: Goal: Ability to remain free from injury will improve Outcome: Progressing   Problem: Skin Integrity: Goal: Risk for impaired skin integrity will decrease Outcome: Progressing   Problem: Coping: Goal: Level of anxiety will decrease Outcome: Not Progressing

## 2020-03-26 NOTE — Progress Notes (Signed)
ANTICOAGULATION CONSULT NOTE - Initial Consult  Pharmacy Consult for Lovenox Indication: Empiric VTE treatment in COVID patient with elevated Ddimer and history of DVT/PE  Allergies  Allergen Reactions   Cat Hair Extract Itching   Diltiazem Anaphylaxis   Sulfa Antibiotics Hives and Swelling   Anoro Ellipta [Umeclidinium-Vilanterol]     Too much mucus   Breo Ellipta [Fluticasone Furoate-Vilanterol]     Too much mucus   Exalgo [Hydromorphone Hcl] Nausea Only    Sick and nauseated   Fentanyl Other (See Comments)    REACTION: too sleepy   Gabapentin    Hydrocodone-Acetaminophen Nausea Only    REACTION: nausea   Lopressor [Metoprolol Tartrate]     Asthmatic issues    Penicillins Hives   Sulfonamide Derivatives Hives and Swelling   Tylenol [Acetaminophen]     Liver issues    Patient Measurements:    Vital Signs: Temp: 97.8 F (36.6 C) (10/04 1939) Temp Source: Oral (10/04 1939) BP: 131/90 (10/04 1939) Pulse Rate: 100 (10/04 1939)  Labs: Recent Labs    03/24/20 0500 03/24/20 0500 03/25/20 0500 03/26/20 0444 03/26/20 1935  HGB 15.4   < > 15.7 15.7  --   HCT 45.6  --  46.0 45.9  --   PLT 253  --  273 268  --   LABPROT 31.9*  --   --   --  31.8*  INR 3.2*  --   --   --  3.2*  CREATININE 0.94  --  1.00 1.20  --    < > = values in this interval not displayed.    Estimated Creatinine Clearance: 86.8 mL/min (by C-G formula based on SCr of 1.2 mg/dL).   Medical History: Past Medical History:  Diagnosis Date   Anxiety    Asthma    "no attack since acupuncture in 7412'I"   Complication of anesthesia    SLOW TO WAKE UP / DIFFICULTY RESPONDING 2003, 3 days before could use left arm, "wild/crazy sometimes"   Cyst of left kidney    "benign"   DVT (deep venous thrombosis) ([place]) 2003   right femoral artery "from groin to knee"   Edema    Right Leg w/ h/o DVT postphlebitic   H/O blood transfusion reaction 2004   FFP, extreme swelling, could not  breath   Headache(784.0)    MIGRAINES-not for a long time   History of pulmonary embolism 2003   both lungs   Hyperlipidemia    Knee pain    left   LBP (low back pain)    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 Warfarin therapy is on hold and pharmacy was consulted to start Lovenox therapy.   INR on 10/2 was elevated at 3.2 despite holding warfarin therapy.  Ordered INR recheck tonight which still remains elevated at 3.2.  CBC wnl.   Goal of Therapy:  Anti-Xa level 0.6-1 units/ml 4hrs after LMWH dose given Monitor platelets by anticoagulation protocol: Yes  Plan:  Continue to hold on Lovenox until INR <2.  Repeat INR in AM.   Sloan Leiter, PharmD, BCPS, BCCCP Clinical Pharmacist Please refer to [facility] for [place] numbers 03/26/2020,8:41 PM

## 2020-03-27 ENCOUNTER — Inpatient Hospital Stay (HOSPITAL_COMMUNITY): Payer: MEDICARE

## 2020-03-27 DIAGNOSIS — U071 COVID-19: Secondary | ICD-10-CM | POA: Diagnosis not present

## 2020-03-27 DIAGNOSIS — R7401 Elevation of levels of liver transaminase levels: Secondary | ICD-10-CM | POA: Diagnosis not present

## 2020-03-27 DIAGNOSIS — I480 Paroxysmal atrial fibrillation: Secondary | ICD-10-CM

## 2020-03-27 DIAGNOSIS — J9601 Acute respiratory failure with hypoxia: Secondary | ICD-10-CM | POA: Diagnosis not present

## 2020-03-27 LAB — CBC WITH DIFFERENTIAL/PLATELET
Abs Immature Granulocytes: 0.35 10*3/uL — ABNORMAL HIGH (ref 0.00–0.07)
Basophils Absolute: 0.1 10*3/uL (ref 0.0–0.1)
Basophils Relative: 0 %
Eosinophils Absolute: 0 10*3/uL (ref 0.0–0.5)
Eosinophils Relative: 0 %
HCT: 44.7 % (ref 39.0–52.0)
Hemoglobin: 15.2 g/dL (ref 13.0–17.0)
Immature Granulocytes: 2 %
Lymphocytes Relative: 3 %
Lymphs Abs: 0.6 10*3/uL — ABNORMAL LOW (ref 0.7–4.0)
MCH: 32.4 pg (ref 26.0–34.0)
MCHC: 34 g/dL (ref 30.0–36.0)
MCV: 95.3 fL (ref 80.0–100.0)
Monocytes Absolute: 0.7 10*3/uL (ref 0.1–1.0)
Monocytes Relative: 4 %
Neutro Abs: 15.3 10*3/uL — ABNORMAL HIGH (ref 1.7–7.7)
Neutrophils Relative %: 91 %
Platelets: 225 10*3/uL (ref 150–400)
RBC: 4.69 MIL/uL (ref 4.22–5.81)
RDW: 12.4 % (ref 11.5–15.5)
WBC: 17 10*3/uL — ABNORMAL HIGH (ref 4.0–10.5)
nRBC: 0 % (ref 0.0–0.2)

## 2020-03-27 LAB — C-REACTIVE PROTEIN: CRP: <0.5 mg/dL (ref <1.0)

## 2020-03-27 LAB — COMPREHENSIVE METABOLIC PANEL
ALT: 74 U/L — ABNORMAL HIGH (ref 0–44)
AST: 36 U/L (ref 15–41)
Albumin: 2.5 g/dL — ABNORMAL LOW (ref 3.5–5.0)
Alkaline Phosphatase: 49 U/L (ref 38–126)
Anion gap: 15 (ref 5–15)
BUN: 36 mg/dL — ABNORMAL HIGH (ref 8–23)
CO2: 23 mmol/L (ref 22–32)
Calcium: 8.3 mg/dL — ABNORMAL LOW (ref 8.9–10.3)
Chloride: 98 mmol/L (ref 98–111)
Creatinine, Ser: 1.06 mg/dL (ref 0.61–1.24)
GFR calc Af Amer: >60 mL/min (ref >60)
GFR calc non Af Amer: >60 mL/min (ref >60)
Glucose, Bld: 168 mg/dL — ABNORMAL HIGH (ref 70–99)
Potassium: 4.1 mmol/L (ref 3.5–5.1)
Sodium: 136 mmol/L (ref 135–145)
Total Bilirubin: 1.2 mg/dL (ref 0.3–1.2)
Total Protein: 6.3 g/dL — ABNORMAL LOW (ref 6.5–8.1)

## 2020-03-27 LAB — PROTIME-INR
INR: 3.1 — ABNORMAL HIGH (ref 0.8–1.2)
Prothrombin Time: 31.2 seconds — ABNORMAL HIGH (ref 11.4–15.2)

## 2020-03-27 LAB — D-DIMER, QUANTITATIVE: D-Dimer, Quant: >20 ug/mL-FEU — ABNORMAL HIGH (ref 0.00–0.50)

## 2020-03-27 LAB — MAGNESIUM: Magnesium: 2.3 mg/dL (ref 1.7–2.4)

## 2020-03-27 MED ORDER — GUAIFENESIN-DM 100-10 MG/5ML PO SYRP
10.0000 mL | ORAL_SOLUTION | ORAL | Status: DC | PRN
  Administered 2020-03-27 – 2020-03-29 (×2): 10 mL via ORAL
  Filled 2020-03-27 (×2): qty 10

## 2020-03-27 MED ORDER — FUROSEMIDE 10 MG/ML IJ SOLN
40.0000 mg | Freq: Once | INTRAMUSCULAR | Status: AC
  Administered 2020-03-27: 40 mg via INTRAVENOUS
  Filled 2020-03-27: qty 4

## 2020-03-27 NOTE — Plan of Care (Signed)

## 2020-03-27 NOTE — Progress Notes (Signed)
Pharmacy:  Warfarin on hold, INR remains elevated at 3.1.  Last Warfarin dose PTA. To begin Lovenox when INR < 2.  Continue daily PT/INR.  Arty Baumgartner, [place] Phone: [phone] 03/27/2020 11:33 AM

## 2020-03-27 NOTE — Evaluation (Signed)
Occupational Therapy Evaluation Patient Details Name: Robert [name] MRN: [id number] DOB: 10/13/1950 Today's Date: 03/27/2020    History of Present Illness 69 yo male presenting via EMS with increased shortness of breath. Recent admission (9/26) with COVID-19 pneumonia and discharged (9/30) to home on 3L O2. 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   Clinical Impression   Prior to last admission, pt was living with his wife and was independent; last admission pt when home with O2 and performing ADLs at Mod I level with increased time.  Pt currently requiring Min A for LB ADLs and Supervision-Min guard A for functional mobility. Pt performing functional mobility to/from recliner and door (with chair at door for seated rest break) with Supervision and RW; x5. SpO2 >87% on 15L via HFNC. HR elevating to 150s with mobility. Pt recovering quickly to SpO2 in 90s and HR in 110s with seated rest break. Pt would benefit from further acute OT to facilitate safe dc. Recommend dc to home with HHOT for further OT to optimize safety, independence with ADLs, and return to PLOF.     Follow Up Recommendations  Home health OT;Supervision/Assistance - 24 hour (Pending progress)    Equipment Recommendations  None recommended by OT    Recommendations for Other Services PT consult     Precautions / Restrictions Precautions Precautions: Other (comment) Precaution Comments: Watch SpO2 and HR      Mobility Bed Mobility               General bed mobility comments: Received sitting in recliner  Transfers Overall transfer level: Needs assistance Equipment used: None Transfers: Sit to/from Stand Sit to Stand: Supervision         General transfer comment: Supervision for safety    Balance Overall balance assessment: Needs assistance Sitting-balance support: Feet supported Sitting balance-Leahy Scale: Good     Standing balance support: No upper extremity  supported;During functional activity Standing balance-Leahy Scale: Fair                             ADL either performed or assessed with clinical judgement   ADL Overall ADL's : Needs assistance/impaired Eating/Feeding: Set up;Sitting   Grooming: Set up;Supervision/safety;Sitting   Upper Body Bathing: Set up;Sitting   Lower Body Bathing: Min guard;Sit to/from stand   Upper Body Dressing : Set up;Sitting   Lower Body Dressing: Min guard;Sit to/from stand   Toilet Transfer: Supervision/safety;Ambulation;RW (simulated to recliner) Toilet Transfer Details (indicate cue type and reason): Cues and Supervision for safety.          Functional mobility during ADLs: Supervision/safety;Rolling walker General ADL Comments: Pt performing functional mobility to/from recliner and door (with chair at door for seated rest break)  with Supervision and RW; x5. SpO2 >87% on 15L via HFNC. HR elevating to 150s with mobility.     Vision Baseline Vision/History: Wears glasses Patient Visual Report: No change from baseline Vision Assessment?: No apparent visual deficits     Perception     Praxis      Pertinent Vitals/Pain Pain Assessment: No/denies pain     Hand Dominance Right   Extremity/Trunk Assessment Upper Extremity Assessment Upper Extremity Assessment: Overall WFL for tasks assessed   Lower Extremity Assessment Lower Extremity Assessment: Defer to PT evaluation   Cervical / Trunk Assessment Cervical / Trunk Assessment: Normal   Communication Communication Communication: No difficulties;Other (comment) (Noting some O2 desaturation with talking)   Cognition  Arousal/Alertness: Awake/alert Behavior During Therapy: WFL for tasks assessed/performed Overall Cognitive Status: Within Functional Limits for tasks assessed                                     General Comments  SpO2 >87% on 15L during mobility. HR elevating to 130-150s in mobility. HR  100-130s at rest. R 20-30s    Exercises Exercises: Other exercises Other Exercises Other Exercises: IS x3 Other Exercises: flutter valve x3   Shoulder Instructions      Home Living Family/patient expects to be discharged to:: Private residence Living Arrangements: Spouse/significant other;Children Available Help at Discharge: Family;Available 24 hours/day Type of Home: House Home Access: Stairs to enter [employer] of Steps: 4-6 Entrance Stairs-Rails: Right;Left Home Layout: One level     Bathroom Shower/Tub: Teacher, early years/pre: Handicapped height     Home Equipment: Clinical cytogeneticist - 2 wheels;Cane - single point;Wheelchair - manual;Transport chair;Hospital bed          Prior Functioning/Environment Level of Independence: Independent        Comments: Prior to most recent hospitalization, Up to walking 3/4 mile with walking sticks due to h/o back issues with BLE numbness/weakness        OT Problem List: Decreased activity tolerance;Cardiopulmonary status limiting activity      OT Treatment/Interventions: Self-care/ADL training;Therapeutic exercise;Energy conservation;DME and/or AE instruction;Patient/family education;Therapeutic activities    OT Goals(Current goals can be found in the care plan section) Acute Rehab OT Goals Patient Stated Goal: "get better" OT Goal Formulation: With patient Time For Goal Achievement: 04/10/20 Potential to Achieve Goals: Good  OT Frequency: Min 2X/week   Barriers to D/C:            Co-evaluation              AM-PAC OT "6 Clicks" Daily Activity     Outcome Measure Help from another person eating meals?: A Little Help from another person taking care of personal grooming?: A Little Help from another person toileting, which includes using toliet, bedpan, or urinal?: A Little Help from another person bathing (including washing, rinsing, drying)?: A Little Help from another person to put on  and taking off regular upper body clothing?: A Little Help from another person to put on and taking off regular lower body clothing?: A Little 6 Click Score: 18   End of Session Equipment Utilized During Treatment: Rolling walker;Oxygen (15L) Nurse Communication: Mobility status  Activity Tolerance: Patient tolerated treatment well Patient left: in chair;with call bell/phone within reach  OT Visit Diagnosis: Other (comment) (decreased cardiopulmonary tolerance)                Time: 7824-2353 OT Time Calculation (min): 38 min Charges:  OT General Charges $OT Visit: 1 Visit OT Evaluation $OT Eval Moderate Complexity: 1 Mod OT Treatments $Therapeutic Activity: 23-37 mins  [place], OTR/L Acute Rehab Pager: [phone] Office: [place] 03/27/2020, 11:05 AM

## 2020-03-27 NOTE — Progress Notes (Signed)
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  He was down to about 3 L of oxygen.  He was discharged with home oxygen.  Apparently became more short of breath at home.  Called EMS.  Oxygen requirements were higher.  He was subsequently hospitalized for further management.  Reason for Visit: Pneumonia due to COVID-19.  Acute respiratory failure with hypoxia  Consultants: None  Procedures: None  Antibiotics: Anti-infectives (From admission, onward)   None      Subjective/Interval History: Patient noted to be in better spirits this morning.  States that he is feeling slightly better.  Continues to have difficulty breathing at times.  Less anxious.  No nausea vomiting.  No chest pain.     Assessment/Plan:  Acute Hypoxic Resp. Failure/Pneumonia due to COVID-19   Recent Labs  Lab 03/21/20 0909 03/22/20 1118 03/23/20 0703 03/23/20 0900 03/23/20 1647 03/24/20 0500 03/25/20 0500 03/26/20 0444 03/27/20 0328  DDIMER <0.27   < >  --    < > 2.74* 17.44* >20.00* >20.00* >20.00*  FERRITIN 1,224*  --  1,502*  --   --  1,648*  --   --   --   CRP 0.8   < > 0.9  --   --  10.7* 8.7* 4.0* <0.5  ALT 155*   < > 203*  --   --  150* 107* 81* 74*  PROCALCITON  --   --  <0.10  --  0.11  --   --   --   --    < > = values in this interval not displayed.    Objective findings: Oxygen requirements: Noted to be saturating in the mid to late 90s on 15 L HFNC.  Was dropped down to 12 L.  Nursing staff was notified.     COVID 19 Therapeutics: Antibacterials: None.  Procalcitonin 0.11 Remdesivir: Recently completed a 5-day course Steroids: Solu-Medrol Diuretics: Lasix given the last 2 days.  We will  repeat another dose today.   Inhaled Steroids: Dulera Baricitinib: Initially started on 9/26.  Resumed on 10/1 after discussion of risks and benefits.  Will complete a total of 14 days or till discharge whichever is earlier. PUD Prophylaxis: Pepcid DVT Prophylaxis: On warfarin prior to admission.  Therapeutic INR noted.  We will transition to Lovenox when INR is below goal.  Patient had to be rehospitalized due to worsening respiratory symptoms.  From a respiratory standpoint patient remains tenuous.  But showing early signs of improvement.  Continue to wean down oxygen.  Patient recently completed a 5-day course of Remdesivir.  He remains on Solu-Medrol and baricitinib.  D-dimer remains greater than 20.  CRP less is 0.5.  Chest x-ray done this morning shows stable appearance.  Continue to mobilize.  Incentive spirometry.  PT and OT evaluation.  D-dimer remains greater than 20.  CT angiogram at the time of admission did not show any PE.  Lower extremity Doppler study negative for DVT.  INR remains therapeutic.  Elevated D-dimer most likely due to COVID-19.  No VTE or other thrombosis noted.  The treatment plan and use of medications and known side effects were discussed with patient/family. Some of the medications used are based on case reports/anecdotal data.  All other  medications being used in the management of COVID-19 based on limited study data.  Complete risks and long-term side effects are unknown, however in the best clinical judgment they seem to be of some benefit.  Patient/family wanted to proceed with treatment options provided.  Transaminitis Most likely due to COVID-19.  Stable for the most part.  Essential hypertension Blood pressure is reasonably well controlled.  Continue to monitor.  Significant lower extremity edema Echocardiogram showed normal systolic function without any significant valvular abnormalities.  Lower extremity Doppler study negative for DVT.  Keep legs  elevated as much as possible.  Lasix been given depending on volume status.  History of MAT/SVT/paroxysmal atrial fibrillation Patient does have a history of MAT and SVT but no previously documented history of atrial fibrillation.. EKG done few days ago did show atrial fibrillation.  Continue telemetry monitoring.  TSH 1.16.   Looks like he has been seen by Dr. Harrell Gave previously with cardiology.  Not noted to be on any AV blocking medications.  Looks like he had refused medications at last visit.  Patient reports "allergy" to multiple cardiac medications. Heart rate is normal.  Continue to monitor.  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  See discussion above as well.  D-dimer noted to be significantly elevated.  CT angiogram was negative.  Lower extremity Dopplers negative for DVT.   History of bronchial asthma Stable.  No wheezing appreciated at this time.  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  Holding his prednisone while he is getting Solu-Medrol.  Recent acute metabolic encephalopathy Apparently was confused during his previous hospitalization.  Seems to be stable currently.  Mentating normally.  Continue to monitor.  Situational anxiety Patient also noted to have crying episodes.  Noted to be on Pamelor.  Dose was increased yesterday.  Anxiolytics as needed.  Steroids are contributing.  We will start tapering when his respiratory status is improved.  Morbid obesity Estimated body mass index is 45.29 kg/m as calculated from the following:   Height as of 03/17/20: 5\' 11"  (1.803 m).   Weight as of 03/18/20: 147.3 kg.   DVT Prophylaxis:  full dose Lovenox Code Status: Limited resuscitation: No intubation but all other interventions are fine.   Family Communication: Wife being updated daily.   Disposition Plan: Hopefully return home when improved  Status is: Inpatient  Remains  inpatient appropriate because:IV treatments appropriate due to intensity of illness or inability to take PO and Inpatient level of care appropriate due to severity of illness   Dispo:  Patient From: Home  Planned Disposition: Home  Expected discharge date: 03/30/20  Medically stable for discharge: No        Medications:  Scheduled: . baricitinib  4 mg Oral Daily  . famotidine  20 mg Oral Daily  . methylPREDNISolone (SOLU-MEDROL) injection  80 mg Intravenous Q12H  . mometasone-formoterol  2 puff Inhalation BID  . nortriptyline  25 mg Oral QPM  . sodium chloride flush  3 mL Intravenous Q12H   Continuous:  VHQ:IONGEXBMWUXLK, albuterol, ALPRAZolam, benzonatate   Objective:  Vital Signs  Vitals:   03/27/20 0024 03/27/20 0400 03/27/20 0750 03/27/20 0820  BP: 130/76 122/74  120/89  Pulse: 95 94  100  Resp: 20 19  20   Temp: 97.9 F (36.6 C) 97.8 F (36.6 C)  98 F (36.7 C)  TempSrc: Oral Oral  Oral  SpO2: 99% 94% 92% 93%  Intake/Output Summary (Last 24 hours) at 03/27/2020 1057 Last data filed at 03/26/2020 1842 Gross per 24 hour  Intake 540 ml  Output 950 ml  Net -410 ml   There were no vitals filed for this visit.   General appearance: Awake alert.  In no distress Resp: Better effort today.  Still has some tachypnea but no use of accessory muscles noted.  Crackles bilateral bases.  No wheezing or rhonchi. Cardio: S1-S2 is normal regular.  No S3-S4.  No rubs murmurs or bruit GI: Abdomen is soft.  Nontender nondistended.  Bowel sounds are present normal.  No masses organomegaly Extremities: Edema bilateral lower extremities Neurologic: Alert and oriented x3.  No focal neurological deficits.      Lab Results:  Data Reviewed: I have personally reviewed following labs and imaging studies  CBC: Recent Labs  Lab 03/23/20 0703 03/23/20 0703 03/23/20 1647 03/23/20 1647 03/23/20 1757 03/24/20 0500 03/25/20 0500 03/26/20 0444 03/27/20 0328  WBC 13.3*    < > 12.5*  --   --  14.2* 13.5* 17.7* 17.0*  NEUTROABS 10.4*  --   --   --   --  12.3* 12.1* 16.1* 15.3*  HGB 15.6   < > 15.3   < > 14.3 15.4 15.7 15.7 15.2  HCT 45.5   < > 45.6   < > 42.0 45.6 46.0 45.9 44.7  MCV 93.4   < > 95.2  --   --  94.2 94.3 94.1 95.3  PLT 283   < > 259  --   --  253 273 268 225   < > = values in this interval not displayed.    Basic Metabolic Panel: Recent Labs  Lab 03/23/20 0703 03/23/20 0703 03/23/20 1647 03/23/20 1757 03/24/20 0500 03/25/20 0500 03/26/20 0444 03/27/20 0328  NA 138   < >  --  138 139 137 137 136  K 3.6   < >  --  4.3 4.5 4.3 4.0 4.1  CL 102  --   --   --  102 99 99 98  CO2 24  --   --   --  27 28 26 23   GLUCOSE 97  --   --   --  143* 180* 195* 168*  BUN 24*  --   --   --  17 23 33* 36*  CREATININE 1.07   < > 1.08  --  0.94 1.00 1.20 1.06  CALCIUM 8.3*  --   --   --  8.4* 8.4* 8.6* 8.3*  MG  --   --   --   --  2.2 2.2 2.3 2.3  PHOS  --   --   --   --  3.5  --   --   --    < > = values in this interval not displayed.    GFR: Estimated Creatinine Clearance: 98.2 mL/min (by C-G formula based on SCr of 1.06 mg/dL).  Liver Function Tests: Recent Labs  Lab 03/23/20 0703 03/24/20 0500 03/25/20 0500 03/26/20 0444 03/27/20 0328  AST 101* 55* 31 24 36  ALT 203* 150* 107* 81* 74*  ALKPHOS 48 49 55 54 49  BILITOT 1.7* 1.3* 1.4* 1.2 1.2  PROT 6.6 6.3* 6.8 6.6 6.3*  ALBUMIN 2.8* 2.5* 2.5* 2.6* 2.5*    Coagulation Profile: Recent Labs  Lab 03/21/20 0909 03/22/20 1118 03/24/20 0500 03/26/20 1935 03/27/20 0328  INR 2.6* 2.7* 3.2* 3.2* 3.1*     Recent Results (from the past  240 hour(s))  Blood Culture (routine x 2)     Status: None   Collection Time: 03/18/20 12:11 AM   Specimen: BLOOD  Result Value Ref Range Status   Specimen Description BLOOD RIGHT ARM  Final   Special Requests   Final    BOTTLES DRAWN AEROBIC AND ANAEROBIC Blood Culture adequate volume   Culture   Final    NO GROWTH 5 DAYS Performed at [facility], [address] [place], [place] [address]    Report Status 03/23/2020 FINAL  Final  Blood Culture (routine x 2)     Status: None   Collection Time: 03/18/20 12:11 AM   Specimen: BLOOD  Result Value Ref Range Status   Specimen Description BLOOD LEFT HAND  Final   Special Requests   Final    BOTTLES DRAWN AEROBIC AND ANAEROBIC Blood Culture results may not be optimal due to an inadequate volume of blood received in culture bottles   Culture   Final    NO GROWTH 5 DAYS Performed at [facility], [place] [address], [place], [place] [address]    Report Status 03/23/2020 FINAL  Final  Respiratory Panel by RT PCR (Flu A&B, Covid) - Nasopharyngeal Swab     Status: Abnormal   Collection Time: 03/18/20 12:11 AM   Specimen: Nasopharyngeal Swab  Result Value Ref Range Status   SARS Coronavirus 2 by RT PCR POSITIVE (A) NEGATIVE Final    Comment: RESULT CALLED TO, READ BACK BY AND VERIFIED WITH: L. VENEGAS,RN 0981 03/18/2020 T. TYSOR (NOTE) SARS-CoV-2 target nucleic acids are DETECTED.  SARS-CoV-2 RNA is generally detectable in upper respiratory specimens  during the acute phase of infection. Positive results are indicative of the presence of the identified virus, but do not rule out bacterial infection or co-infection with other pathogens not detected by the test. Clinical correlation with patient history and other diagnostic information is necessary to determine patient infection status. The expected result is Negative.  Fact Sheet for Patients:  PinkCheek.be  Fact Sheet for Healthcare Providers: GravelBags.it  This test is not yet approved or cleared by the [place] FDA and  has been authorized for detection and/or diagnosis of SARS-CoV-2 by FDA under an Emergency Use Authorization (EUA).  This EUA will remain in effect (meaning this test can  be used) for the duration of  the COVID-19 declaration under Section  564(b)(1) of the Act, 21 U.S.C. section 360bbb-3(b)(1), unless the authorization is terminated or revoked sooner.      Influenza A by PCR NEGATIVE NEGATIVE Final   Influenza B by PCR NEGATIVE NEGATIVE Final    Comment: (NOTE) The Xpert Xpress SARS-CoV-2/FLU/RSV assay is intended as an aid in  the diagnosis of influenza from Nasopharyngeal swab specimens and  should not be used as a sole basis for treatment. Nasal washings and  aspirates are unacceptable for Xpert Xpress SARS-CoV-2/FLU/RSV  testing.  Fact Sheet for Patients: PinkCheek.be  Fact Sheet for Healthcare Providers: GravelBags.it  This test is not yet approved or cleared by the [place] FDA and  has been authorized for detection and/or diagnosis of SARS-CoV-2 by  FDA under an Emergency Use Authorization (EUA). This EUA will remain  in effect (meaning this test can be used) for the duration of the  Covid-19 declaration under Section 564(b)(1) of the Act, 21  U.S.C. section 360bbb-3(b)(1), unless the authorization is  terminated or revoked. Performed at [facility], [place] [address] [place], [place] [address]  Urine culture     Status: Abnormal   Collection Time: 03/18/20  2:32 AM   Specimen: In/Out Cath Urine  Result Value Ref Range Status   Specimen Description IN/OUT CATH URINE  Final   Special Requests   Final    NONE Performed at [facility], [place] [address], [place], [place] [address]    Culture MULTIPLE SPECIES PRESENT, SUGGEST RECOLLECTION (A)  Final   Report Status 03/19/2020 FINAL  Final  Blood Culture (routine x 2)     Status: None (Preliminary result)   Collection Time: 03/23/20  7:40 AM   Specimen: BLOOD  Result Value Ref Range Status   Specimen Description BLOOD RIGHT ANTECUBITAL  Final   Special Requests   Final    BOTTLES DRAWN AEROBIC AND ANAEROBIC Blood Culture results may not be optimal due to an inadequate volume of  blood received in culture bottles   Culture   Final    NO GROWTH 4 DAYS Performed at [facility], [place] [address], [place], [place] [address]    Report Status PENDING  Incomplete  Blood Culture (routine x 2)     Status: None (Preliminary result)   Collection Time: 03/23/20  8:00 AM   Specimen: BLOOD  Result Value Ref Range Status   Specimen Description BLOOD LEFT ANTECUBITAL  Final   Special Requests   Final    BOTTLES DRAWN AEROBIC AND ANAEROBIC Blood Culture adequate volume   Culture   Final    NO GROWTH 4 DAYS Performed at [facility], [place] [address] [place], [place] [address]    Report Status PENDING  Incomplete  MRSA PCR Screening     Status: None   Collection Time: 03/24/20  6:54 PM   Specimen: Nasal Mucosa; Nasopharyngeal  Result Value Ref Range Status   MRSA by PCR NEGATIVE NEGATIVE Final    Comment:        The GeneXpert MRSA Assay (FDA approved for NASAL specimens only), is one component of a comprehensive MRSA colonization surveillance program. It is not intended to diagnose MRSA infection nor to guide or monitor treatment for MRSA infections. Performed at [facility], [place] [address] [place], [place] [address]       Radiology Studies: DG Chest Port 1 View  Result Date: 03/27/2020 CLINICAL DATA:  COVID-19 pneumonia EXAM: PORTABLE CHEST 1 VIEW COMPARISON:  Chest radiograph and chest CT March 23, 2020 FINDINGS: Widespread patchy airspace opacity remains, slightly more on the left than on the right. No new opacity evident. Heart is enlarged, stable, with pulmonary vascularity normal. No adenopathy. No bone lesions. IMPRESSION: Multifocal airspace opacity consistent with atypical organism pneumonia, stable. Stable cardiac prominence. No adenopathy evident. Electronically Signed   By: Lowella Grip III M.D.   On: 03/27/2020 07:55       LOS: 4 days   [facility] Pager on [email or web address]  03/27/2020, 10:57  AM

## 2020-03-27 NOTE — Consult Note (Signed)
   [facility] Inpatient Consult   03/27/2020  BOMANI OOMMEN 11/04/1950 [id number]  [facility] [[facility]] Patient: [employer]  Follow up:  Reviewed for less than 7 days readmission.  Medicare record review reveals patient readmitted with worsening shortness of breath requiring high flow oxygen.  Plan: Follow for progress and disposition needs for complex disease management needs.   For questions please contact:  Natividad Brood, RN BSN [facility] Liaison  [phone] business mobile phone Toll free office [phone]  Fax number: [phone] [email or web address]

## 2020-03-28 DIAGNOSIS — J9601 Acute respiratory failure with hypoxia: Secondary | ICD-10-CM | POA: Diagnosis not present

## 2020-03-28 DIAGNOSIS — U071 COVID-19: Secondary | ICD-10-CM | POA: Diagnosis not present

## 2020-03-28 DIAGNOSIS — J1282 Pneumonia due to Coronavirus disease 2019: Secondary | ICD-10-CM | POA: Diagnosis not present

## 2020-03-28 LAB — MAGNESIUM: Magnesium: 2.4 mg/dL (ref 1.7–2.4)

## 2020-03-28 LAB — CBC WITH DIFFERENTIAL/PLATELET
Abs Immature Granulocytes: 0.39 10*3/uL — ABNORMAL HIGH (ref 0.00–0.07)
Basophils Absolute: 0 10*3/uL (ref 0.0–0.1)
Basophils Relative: 0 %
Eosinophils Absolute: 0 10*3/uL (ref 0.0–0.5)
Eosinophils Relative: 0 %
HCT: 44.6 % (ref 39.0–52.0)
Hemoglobin: 15.6 g/dL (ref 13.0–17.0)
Immature Granulocytes: 2 %
Lymphocytes Relative: 3 %
Lymphs Abs: 0.5 10*3/uL — ABNORMAL LOW (ref 0.7–4.0)
MCH: 32.9 pg (ref 26.0–34.0)
MCHC: 35 g/dL (ref 30.0–36.0)
MCV: 94.1 fL (ref 80.0–100.0)
Monocytes Absolute: 0.9 10*3/uL (ref 0.1–1.0)
Monocytes Relative: 6 %
Neutro Abs: 15.2 10*3/uL — ABNORMAL HIGH (ref 1.7–7.7)
Neutrophils Relative %: 89 %
Platelets: 210 10*3/uL (ref 150–400)
RBC: 4.74 MIL/uL (ref 4.22–5.81)
RDW: 12.4 % (ref 11.5–15.5)
WBC: 17.1 10*3/uL — ABNORMAL HIGH (ref 4.0–10.5)
nRBC: 0 % (ref 0.0–0.2)

## 2020-03-28 LAB — CULTURE, BLOOD (ROUTINE X 2)
Culture: NO GROWTH
Culture: NO GROWTH
Special Requests: ADEQUATE

## 2020-03-28 LAB — D-DIMER, QUANTITATIVE: D-Dimer, Quant: >20 ug/mL-FEU — ABNORMAL HIGH (ref 0.00–0.50)

## 2020-03-28 LAB — COMPREHENSIVE METABOLIC PANEL
ALT: 89 U/L — ABNORMAL HIGH (ref 0–44)
AST: 45 U/L — ABNORMAL HIGH (ref 15–41)
Albumin: 2.6 g/dL — ABNORMAL LOW (ref 3.5–5.0)
Alkaline Phosphatase: 54 U/L (ref 38–126)
Anion gap: 12 (ref 5–15)
BUN: 35 mg/dL — ABNORMAL HIGH (ref 8–23)
CO2: 28 mmol/L (ref 22–32)
Calcium: 8.5 mg/dL — ABNORMAL LOW (ref 8.9–10.3)
Chloride: 95 mmol/L — ABNORMAL LOW (ref 98–111)
Creatinine, Ser: 1.04 mg/dL (ref 0.61–1.24)
GFR calc non Af Amer: >60 mL/min (ref >60)
Glucose, Bld: 171 mg/dL — ABNORMAL HIGH (ref 70–99)
Potassium: 4 mmol/L (ref 3.5–5.1)
Sodium: 135 mmol/L (ref 135–145)
Total Bilirubin: 1.5 mg/dL — ABNORMAL HIGH (ref 0.3–1.2)
Total Protein: 6.5 g/dL (ref 6.5–8.1)

## 2020-03-28 LAB — PROTIME-INR
INR: 2.3 — ABNORMAL HIGH (ref 0.8–1.2)
Prothrombin Time: 24.9 seconds — ABNORMAL HIGH (ref 11.4–15.2)

## 2020-03-28 LAB — C-REACTIVE PROTEIN: CRP: 0.6 mg/dL (ref <1.0)

## 2020-03-28 MED ORDER — DM-GUAIFENESIN ER 30-600 MG PO TB12
1.0000 | ORAL_TABLET | Freq: Two times a day (BID) | ORAL | Status: DC
  Administered 2020-03-28: 1 via ORAL
  Filled 2020-03-28: qty 1

## 2020-03-28 MED ORDER — FUROSEMIDE 10 MG/ML IJ SOLN
40.0000 mg | Freq: Once | INTRAMUSCULAR | Status: AC
  Administered 2020-03-28: 40 mg via INTRAVENOUS
  Filled 2020-03-28: qty 4

## 2020-03-28 MED ORDER — GUAIFENESIN ER 600 MG PO TB12
600.0000 mg | ORAL_TABLET | Freq: Two times a day (BID) | ORAL | Status: DC
  Administered 2020-03-28 – 2020-04-04 (×14): 600 mg via ORAL
  Filled 2020-03-28 (×14): qty 1

## 2020-03-28 NOTE — Progress Notes (Signed)
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  He was down to about 3 L of oxygen.  He was discharged with home oxygen.  Apparently became more short of breath at home.  Called EMS.  Oxygen requirements were higher.  He was subsequently hospitalized for further management.  Reason for Visit: Pneumonia due to COVID-19.  Acute respiratory failure with hypoxia  Consultants: None  Procedures: None  Antibiotics: Anti-infectives (From admission, onward)   None      Subjective/Interval History: Patient noted to be in better spirits overall today.  Shortness of breath persists but better than yesterday.  No chest pain.  No nausea vomiting.     Assessment/Plan:  Acute Hypoxic Resp. Failure/Pneumonia due to COVID-19   Recent Labs  Lab 03/23/20 0703 03/23/20 0703 03/23/20 0900 03/23/20 1647 03/24/20 0500 03/25/20 0500 03/26/20 0444 03/27/20 0328 03/28/20 0524 03/28/20 0634  DDIMER  --    < >   < > 2.74* 17.44* >20.00* >20.00* >20.00*  --  >20.00*  FERRITIN 1,502*  --   --   --  1,648*  --   --   --   --   --   CRP 0.9   < >  --   --  10.7* 8.7* 4.0* <0.5 0.6  --   ALT 203*   < >  --   --  150* 107* 81* 74* 89*  --   PROCALCITON <0.10  --   --  0.11  --   --   --   --   --   --    < > = values in this interval not displayed.    Objective findings: Oxygen requirements: On high flow nasal cannula at 10 L/min.  Saturating in the mid 90s.  COVID 19 Therapeutics: Antibacterials: None.  Procalcitonin 0.11 Remdesivir: Recently completed a 5-day course Steroids: Solu-Medrol Diuretics: Lasix given the last 3 days.  We will repeat another dose today. Inhaled Steroids:  Dulera Baricitinib: Initially started on 9/26.  Resumed on 10/1 after discussion of risks and benefits.  Will complete a total of 14 days or till discharge whichever is earlier. PUD Prophylaxis: Pepcid DVT Prophylaxis: On warfarin prior to admission.  Therapeutic INR noted.  We will transition to Lovenox when INR is below goal.  Patient had to be rehospitalized due to worsening respiratory symptoms.  From a respiratory standpoint patient seems to be making some progress.  He is on less oxygen today than yesterday.  He has completed course of Remdesivir.  He remains on Solu-Medrol and baricitinib.  D-dimer remains greater than 20.  CRP down to normal.  Chest x-ray done yesterday morning showed stable findings.  Leukocytosis is due to steroids.  CT angiogram done at the time of admission did not show any PE.  Lower extremity Doppler studies were negative for DVT.  INR has been therapeutic.  Elevated D-dimer most likely due to COVID-19.  Continue to mobilize, incentive spirometry, PT and OT evaluation.  The treatment plan and use of medications and known side effects were discussed with patient/family. Some of the medications used are based on case reports/anecdotal data.  All other medications being used in the management of COVID-19 based on limited study data.  Complete risks  and long-term side effects are unknown, however in the best clinical judgment they seem to be of some benefit.  Patient/family wanted to proceed with treatment options provided.  Transaminitis Most likely due to COVID-19.  Continue to monitor periodically.  Essential hypertension Blood pressure is reasonably well controlled.  Continue to monitor.  Significant lower extremity edema Echocardiogram showed normal systolic function without any significant valvular abnormalities.  Lower extremity Doppler study negative for DVT.  Keep legs elevated as much as possible.  Lasix been given depending on volume status.  History of  MAT/SVT/paroxysmal atrial fibrillation Patient does have a history of MAT and SVT but no previously documented history of atrial fibrillation.. EKG done few days ago did show atrial fibrillation.  Continue telemetry monitoring.  TSH 1.16.   Looks like he has been seen by Dr. Harrell Gave previously with cardiology.  Not noted to be on any AV blocking medications.  Looks like he had refused medications at last visit.  Patient reports "allergy" to multiple cardiac medications. Heart rate is normal.  Continue to monitor.  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  D-dimer noted to be significantly elevated.  CT angiogram was negative.  Lower extremity Dopplers negative for DVT.   History of bronchial asthma Stable.  No wheezing appreciated at this time.  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  Holding his prednisone while he is getting Solu-Medrol.  Recent acute metabolic encephalopathy Apparently was confused during his previous hospitalization.  Seems to be at baseline currently.  Situational anxiety Apparently patient sister is quite sick with terminal cancer and is under hospice care at home.  She might pass away soon.  Patient with occasional crying episodes.  Pamelor dose was increased yesterday.  Continue alprazolam as needed.  Steroids likely contributing and will be tapered down once his respiratory status shows consistent improvement.    Morbid obesity Estimated body mass index is 45.29 kg/m as calculated from the following:   Height as of 03/17/20: 5\' 11"  (1.803 m).   Weight as of 03/18/20: 147.3 kg.   DVT Prophylaxis:  full dose Lovenox to be started when INR is less than 2 Code Status: Limited resuscitation: No intubation but all other interventions are fine.   Family Communication: Wife being updated daily.   Disposition Plan: Hopefully return home when  improved  Status is: Inpatient  Remains inpatient appropriate because:IV treatments appropriate due to intensity of illness or inability to take PO and Inpatient level of care appropriate due to severity of illness   Dispo:  Patient From: Home  Planned Disposition: Home  Expected discharge date: 03/30/20  Medically stable for discharge: No        Medications:  Scheduled: . baricitinib  4 mg Oral Daily  . dextromethorphan-guaiFENesin  1 tablet Oral BID  . famotidine  20 mg Oral Daily  . methylPREDNISolone (SOLU-MEDROL) injection  80 mg Intravenous Q12H  . mometasone-formoterol  2 puff Inhalation BID  . nortriptyline  25 mg Oral QPM  . sodium chloride flush  3 mL Intravenous Q12H   Continuous:  GQQ:PYPPJKDTOIZTI, albuterol, ALPRAZolam, benzonatate, guaiFENesin-dextromethorphan   Objective:  Vital Signs  Vitals:   03/27/20 2005 03/27/20 2358 03/28/20 0349 03/28/20 0745  BP: 126/82 136/87 121/69 132/90  Pulse: 93 92 92 98  Resp: 19 (!) 21 (!) 23 (!) 22  Temp: 98.2 F (36.8 C) 97.9 F (36.6 C) 97.8 F (36.6 C) 97.9 F (  36.6 C)  TempSrc: Oral Oral Oral Oral  SpO2: 98% 100% 99% 100%    Intake/Output Summary (Last 24 hours) at 03/28/2020 1018 Last data filed at 03/28/2020 0904 Gross per 24 hour  Intake 320 ml  Output 1100 ml  Net -780 ml   There were no vitals filed for this visit.   General appearance: Awake alert.  In no distress Resp: Mildly tachypneic at rest.  No use of accessory muscles.  Crackles bilateral bases.  Coarse breath sounds.  No wheezing or rhonchi.   GI: Abdomen is soft.  Nontender nondistended.  Bowel sounds are present normal.  No masses organomegaly Extremities: Improving edema bilateral lower extremities Neurologic: Alert and oriented x3.  No focal neurological deficits.      Lab Results:  Data Reviewed: I have personally reviewed following labs and imaging studies  CBC: Recent Labs  Lab 03/24/20 0500 03/25/20 0500  03/26/20 0444 03/27/20 0328 03/28/20 0524  WBC 14.2* 13.5* 17.7* 17.0* 17.1*  NEUTROABS 12.3* 12.1* 16.1* 15.3* 15.2*  HGB 15.4 15.7 15.7 15.2 15.6  HCT 45.6 46.0 45.9 44.7 44.6  MCV 94.2 94.3 94.1 95.3 94.1  PLT 253 273 268 225 578    Basic Metabolic Panel: Recent Labs  Lab 03/24/20 0500 03/25/20 0500 03/26/20 0444 03/27/20 0328 03/28/20 0524  NA 139 137 137 136 135  K 4.5 4.3 4.0 4.1 4.0  CL 102 99 99 98 95*  CO2 27 28 26 23 28   GLUCOSE 143* 180* 195* 168* 171*  BUN 17 23 33* 36* 35*  CREATININE 0.94 1.00 1.20 1.06 1.04  CALCIUM 8.4* 8.4* 8.6* 8.3* 8.5*  MG 2.2 2.2 2.3 2.3 2.4  PHOS 3.5  --   --   --   --     GFR: Estimated Creatinine Clearance: 100.1 mL/min (by C-G formula based on SCr of 1.04 mg/dL).  Liver Function Tests: Recent Labs  Lab 03/24/20 0500 03/25/20 0500 03/26/20 0444 03/27/20 0328 03/28/20 0524  AST 55* 31 24 36 45*  ALT 150* 107* 81* 74* 89*  ALKPHOS 49 55 54 49 54  BILITOT 1.3* 1.4* 1.2 1.2 1.5*  PROT 6.3* 6.8 6.6 6.3* 6.5  ALBUMIN 2.5* 2.5* 2.6* 2.5* 2.6*    Coagulation Profile: Recent Labs  Lab 03/22/20 1118 03/24/20 0500 03/26/20 1935 03/27/20 0328 03/28/20 0634  INR 2.7* 3.2* 3.2* 3.1* 2.3*     Recent Results (from the past 240 hour(s))  Blood Culture (routine x 2)     Status: None   Collection Time: 03/23/20  7:40 AM   Specimen: BLOOD  Result Value Ref Range Status   Specimen Description BLOOD RIGHT ANTECUBITAL  Final   Special Requests   Final    BOTTLES DRAWN AEROBIC AND ANAEROBIC Blood Culture results may not be optimal due to an inadequate volume of blood received in culture bottles   Culture   Final    NO GROWTH 5 DAYS Performed at [facility], [place] [address], [place], [place] [address]    Report Status 03/28/2020 FINAL  Final  Blood Culture (routine x 2)     Status: None   Collection Time: 03/23/20  8:00 AM   Specimen: BLOOD  Result Value Ref Range Status   Specimen Description BLOOD LEFT ANTECUBITAL   Final   Special Requests   Final    BOTTLES DRAWN AEROBIC AND ANAEROBIC Blood Culture adequate volume   Culture   Final    NO GROWTH 5 DAYS Performed at [facility], [place] [address], [place], [place] [address]    Report Status 03/28/2020 FINAL  Final  MRSA PCR Screening     Status: None   Collection Time: 03/24/20  6:54 PM   Specimen: Nasal Mucosa; Nasopharyngeal  Result Value Ref Range Status   MRSA by PCR NEGATIVE NEGATIVE Final    Comment:        The GeneXpert MRSA Assay (FDA approved for NASAL specimens only), is one component of a comprehensive MRSA colonization surveillance program. It is not intended to diagnose MRSA infection nor to guide or monitor treatment for MRSA infections. Performed at [facility], [place] [address], [place], [place] [address]       Radiology Studies: DG Chest Port 1 View  Result Date: 03/27/2020 CLINICAL DATA:  COVID-19 pneumonia EXAM: PORTABLE CHEST 1 VIEW COMPARISON:  Chest radiograph and chest CT March 23, 2020 FINDINGS: Widespread patchy airspace opacity remains, slightly more on the left than on the right. No new opacity evident. Heart is enlarged, stable, with pulmonary vascularity normal. No adenopathy. No bone lesions. IMPRESSION: Multifocal airspace opacity consistent with atypical organism pneumonia, stable. Stable cardiac prominence. No adenopathy evident. Electronically Signed   By: Lowella Grip III M.D.   On: 03/27/2020 07:55       LOS: 5 days   [facility] Pager on [email or web address]  03/28/2020, 10:18 AM

## 2020-03-28 NOTE — Progress Notes (Signed)
Physical Therapy Treatment Patient Details Name: Robert [name] MRN: [id number] DOB: 1950-09-17 Today's Date: 03/28/2020    History of Present Illness 69 yo male presenting via EMS with increased shortness of breath. Recent admission (9/26) with COVID-19 pneumonia and discharged (9/30) to home on 3L O2. 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    PT Comments    Pt reports that RN has just decreased his supplemental O2 from 10 to 8 L/min and that he is experiencing increased work of breathing. Focus of session became activity with limited drop in SaO2. Pt able to maintain SaO2 > 92%O2 with 10x sit to stand. Exercise required increased time to limit tachycardia. With first bout HR increased to a max of 135 bpm , with subsequent bouts HR max was in the high 120 bpm. Pt required 1-2 min for HR to recover back to 90s in between bouts of standing. Pt reports that activity was hard but that he does not feel as fatigued. D/c plans remain appropriate. PT will continue to follow acutely.    Follow Up Recommendations  No PT follow up     Equipment Recommendations  Other (comment) Judie Petit Rollator)       Precautions / Restrictions Precautions Precautions: Other (comment) Precaution Comments: Watch SpO2 and HR Restrictions Weight Bearing Restrictions: No    Mobility  Bed Mobility               General bed mobility comments: Received sitting in recliner  Transfers Overall transfer level: Needs assistance Equipment used: None Transfers: Sit to/from Stand Sit to Stand: Supervision         General transfer comment: Supervision for safety      Balance Overall balance assessment: Needs assistance Sitting-balance support: Feet supported Sitting balance-Leahy Scale: Good     Standing balance support: No upper extremity supported;During functional activity Standing balance-Leahy Scale: Fair                              Cognition  Arousal/Alertness: Awake/alert Behavior During Therapy: WFL for tasks assessed/performed Overall Cognitive Status: Within Functional Limits for tasks assessed                                        Exercises Other Exercises Other Exercises: 10x sit<>stand, while maintaining SaO2 >92%O2 on 8L O2 via HFNC    General Comments General comments (skin integrity, edema, etc.): Pt O2 had just been reduced from 10 to 8L, pt with c/o increased WOB, with seated rest breaks pt able to maintain SaO2 >93%O2 throughout session, with sit>stands HR max 135 with inital sit<>stand, with additional bouts pt able max HR with standing dropped to max of 120 bpm      Pertinent Vitals/Pain Pain Assessment: No/denies pain           PT Goals (current goals can now be found in the care plan section) Acute Rehab PT Goals Patient Stated Goal: "get better" PT Goal Formulation: With patient Time For Goal Achievement: 04/09/20 Potential to Achieve Goals: Good Progress towards PT goals: Progressing toward goals    Frequency    Min 3X/week      PT Plan Current plan remains appropriate       AM-PAC PT "6 Clicks" Mobility   Outcome Measure  Help needed turning from your back to your side  while in a flat bed without using bedrails?: None Help needed moving from lying on your back to sitting on the side of a flat bed without using bedrails?: None Help needed moving to and from a bed to a chair (including a wheelchair)?: None Help needed standing up from a chair using your arms (e.g., wheelchair or bedside chair)?: None Help needed to walk in hospital room?: A Little Help needed climbing 3-5 steps with a railing? : A Little 6 Click Score: 22    End of Session Equipment Utilized During Treatment: Oxygen Activity Tolerance: Other (comment) (limited by tachycardia) Patient left: in chair;with call bell/phone within reach;with nursing/sitter in room Nurse Communication: Mobility status PT  Visit Diagnosis: Other abnormalities of gait and mobility (R26.89)     Time: 8295-6213 PT Time Calculation (min) (ACUTE ONLY): 29 min  Charges:  $Therapeutic Exercise: 23-37 mins                     Zyairah Wacha B. Migdalia Dk PT, DPT Acute Rehabilitation Services Pager [phone] Office [phone]    [place] 03/28/2020, 2:26 PM

## 2020-03-28 NOTE — TOC Initial Note (Signed)
Transition of Care [facility]) - Initial/Assessment Note    Patient Details  Name: Robert [name] MRN: [id number] Date of Birth: 04/27/1951  Transition of [facility]) CM/SW Contact:    Carles Collet, RN Phone Number: 03/28/2020, 11:39 AM  Clinical Narrative:    Damaris Schooner w patient over the phone. He verified that he has home oxygen with [employer]. He was referred to [facility] with prior admission, he was readmitted before they could start. He is agreeable to [facility] services again with [facility]. Patient would benefit from [facility] services due to readmission, and home oxygen use. [facility] notified, patient will need resumption orders. Wife to provide transportation home.   Patient continues to wean, currently requiring 10L.                Expected Discharge Plan: [place] Barriers to Discharge: Continued Medical Work up   Patient Goals and CMS Choice Patient states their goals for this hospitalization and ongoing recovery are:: to go home CMS Medicare.gov Compare Post Acute Care list provided to:: Patient Choice offered to / list presented to : Patient  Expected Discharge Plan and Services Expected Discharge Plan: [place]   Discharge Planning Services: CM Consult Post Acute Care Choice: Home Health, Durable Medical Equipment                             HH Arranged: PT [facility] Agency: [facility] Date [place]: 03/28/20 Time HH Agency Contacted: 54 Representative spoke with at [place]: [place] Arrangements/Services   Lives with:: Spouse              Current home services: Home PT, DME    Activities of Daily Living      Permission Sought/Granted                  Emotional Assessment              Admission diagnosis:  Acute respiratory failure with hypoxia ([place]) [J96.01] Acute hypoxemic respiratory failure due to COVID-19 ([place]) [U07.1, J96.01] COVID-19 [U07.1] Patient Active Problem List    Diagnosis Date Noted  . Acute hypoxemic respiratory failure due to COVID-19 ([place]) 03/23/2020  . Acute respiratory failure due to COVID-19 ([place]) 03/18/2020  . Pneumonia due to COVID-19 virus 03/18/2020  . Hyperglycemia 12/21/2019  . Essential hypertension 11/24/2018  . History of DVT of lower extremity 07/29/2018  . Cellulitis, leg 04/12/2018  . Allergic rhinitis 04/12/2018  . Long term (current) use of anticoagulants 03/20/2017  . Well adult exam 02/18/2016  . Chronic venous insufficiency 02/18/2016  . Foot pain, right 02/27/2015  . Lyme disease 12/26/2014  . SVT (supraventricular tachycardia) ([place]) 05/09/2014  . Sinusitis, chronic 02/24/2014  . Cough 02/24/2014  . Encounter for therapeutic drug monitoring 07/22/2013  . Right thigh pain 07/13/2013  . Dysuria 06/28/2013  . Anemia 06/28/2013  . Aortic stenosis 05/11/2013  . Osteoarthritis of right knee 05/11/2013  . Paroxysmal supraventricular tachycardia ([place]) 04/05/2013  . Postoperative anemia due to acute blood loss 03/22/2013  . Hyponatremia 03/22/2013  . OA (osteoarthritis) of knee 03/21/2013  . Osteoarthritis of left knee 03/14/2013  . Acute sinus infection 01/04/2013  . Dermatitis 01/04/2013  . Elevated LFTs 12/21/2012  . Abscess of finger, left 04/05/2011  . Chest pain 03/24/2011  . Hematuria, gross 02/26/2011  . Low back pain 02/25/2011  . Pulmonary nodule, right 11/26/2010  . 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 Chest pain syndrome 09/25/2010  . History of pulmonary embolism 09/04/2010  . Long term current use of anticoagulant 09/04/2010  . B12 deficiency 07/23/2010  . PARESTHESIA 06/18/2010  . DVT of leg (deep venous thrombosis) ([place]) 05/15/2010  . LYMPHADENOPATHY 05/15/2010  . Obesity, Class III, BMI 40-49.9 (morbid obesity) ([place]) 11/14/2009  . OTITIS EXTERNA 11/14/2009  . SEBORRHEIC DERMATITIS 11/14/2009  . DIARRHEA, ACUTE 10/08/2009  . Dyslipidemia 05/15/2009  . OSTEOARTHRITIS 05/15/2009  . OTITIS MEDIA  10/27/2008  . CELLULITIS, HAND, LEFT 10/27/2008  . DEHYDRATION (VOLUME DEPLETION) 04/03/2008  . Asthma 08/26/2007  . Neoplasm of uncertain behavior of skin 04/23/2007  . Edema 04/23/2007  . PULMONARY EMBOLISM, HX OF 04/23/2007   PCP:  Cassandria Anger, MD Pharmacy:   [id number] - [place], [place] [address]  [address] Phone: [phone] Fax: [phone]  [facility] [place], [place] [address] [place] [address] Phone: [phone] Fax: [phone]     Social Determinants of Health (SDOH) Interventions    Readmission Risk Interventions No flowsheet data found.

## 2020-03-28 NOTE — Progress Notes (Signed)
Pharmacy:  Warfarin on hold, INR trending down - 2.3 today.  Last Warfarin dose PTA. To begin Lovenox when INR < 2.  Continue daily PT/INR.  Thank you Anette Guarneri, PharmD [phone] 03/28/2020 9:15 AM

## 2020-03-29 ENCOUNTER — Encounter (HOSPITAL_COMMUNITY): Payer: Self-pay | Admitting: Internal Medicine

## 2020-03-29 DIAGNOSIS — J9601 Acute respiratory failure with hypoxia: Secondary | ICD-10-CM | POA: Diagnosis not present

## 2020-03-29 DIAGNOSIS — R000 Tachycardia, unspecified: Secondary | ICD-10-CM | POA: Diagnosis not present

## 2020-03-29 DIAGNOSIS — J1282 Pneumonia due to Coronavirus disease 2019: Secondary | ICD-10-CM | POA: Diagnosis not present

## 2020-03-29 DIAGNOSIS — I480 Paroxysmal atrial fibrillation: Secondary | ICD-10-CM | POA: Diagnosis not present

## 2020-03-29 DIAGNOSIS — U071 COVID-19: Secondary | ICD-10-CM | POA: Diagnosis not present

## 2020-03-29 LAB — D-DIMER, QUANTITATIVE: D-Dimer, Quant: >20 ug/mL-FEU — ABNORMAL HIGH (ref 0.00–0.50)

## 2020-03-29 LAB — COMPREHENSIVE METABOLIC PANEL
ALT: 93 U/L — ABNORMAL HIGH (ref 0–44)
AST: 42 U/L — ABNORMAL HIGH (ref 15–41)
Albumin: 2.8 g/dL — ABNORMAL LOW (ref 3.5–5.0)
Alkaline Phosphatase: 55 U/L (ref 38–126)
Anion gap: 12 (ref 5–15)
BUN: 34 mg/dL — ABNORMAL HIGH (ref 8–23)
CO2: 30 mmol/L (ref 22–32)
Calcium: 8.5 mg/dL — ABNORMAL LOW (ref 8.9–10.3)
Chloride: 94 mmol/L — ABNORMAL LOW (ref 98–111)
Creatinine, Ser: 1.22 mg/dL (ref 0.61–1.24)
GFR calc non Af Amer: >60 mL/min (ref >60)
Glucose, Bld: 167 mg/dL — ABNORMAL HIGH (ref 70–99)
Potassium: 4.2 mmol/L (ref 3.5–5.1)
Sodium: 136 mmol/L (ref 135–145)
Total Bilirubin: 1.6 mg/dL — ABNORMAL HIGH (ref 0.3–1.2)
Total Protein: 6.5 g/dL (ref 6.5–8.1)

## 2020-03-29 LAB — PROTIME-INR
INR: 1.9 — ABNORMAL HIGH (ref 0.8–1.2)
Prothrombin Time: 21.1 seconds — ABNORMAL HIGH (ref 11.4–15.2)

## 2020-03-29 MED ORDER — AMIODARONE HCL 200 MG PO TABS
400.0000 mg | ORAL_TABLET | Freq: Two times a day (BID) | ORAL | Status: DC
  Administered 2020-03-29 – 2020-04-04 (×12): 400 mg via ORAL
  Filled 2020-03-29 (×12): qty 2

## 2020-03-29 MED ORDER — ORAL CARE MOUTH RINSE
15.0000 mL | Freq: Two times a day (BID) | OROMUCOSAL | Status: DC
  Administered 2020-03-29 – 2020-04-04 (×11): 15 mL via OROMUCOSAL

## 2020-03-29 MED ORDER — ENOXAPARIN SODIUM 150 MG/ML ~~LOC~~ SOLN
140.0000 mg | Freq: Two times a day (BID) | SUBCUTANEOUS | Status: DC
  Administered 2020-03-29 – 2020-04-02 (×10): 140 mg via SUBCUTANEOUS
  Filled 2020-03-29 (×11): qty 0.94

## 2020-03-29 MED ORDER — METHYLPREDNISOLONE SODIUM SUCC 125 MG IJ SOLR
60.0000 mg | Freq: Two times a day (BID) | INTRAMUSCULAR | Status: DC
  Administered 2020-03-29 – 2020-03-30 (×2): 60 mg via INTRAVENOUS
  Filled 2020-03-29 (×2): qty 2

## 2020-03-29 NOTE — Progress Notes (Signed)
Occupational Therapy Treatment Patient Details Name: Robert [name] MRN: [id number] DOB: [birth year] Today's Date: 03/29/2020    History of present illness 69 yo male presenting via EMS with increased shortness of breath. Recent admission (9/26) with COVID-19 pneumonia and discharged (9/30) to home on 3L O2. 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   OT comments  Pt progressing towards established OT goals. Pt performing functional mobility with supervision and RW. Pt tolerating distances of ~6-10 feet before requiring to sit as HR elevating into 140s. Pt maintaining SpO2 >86% on RA throughout mobility. Pt requiring several seated rest breaks throughout to return HR to 90s, SpO2 to 95%, and RR to 20. Continue to recommend dc to home with HHOT and will continue to follow acutely as admitted.    Follow Up Recommendations  Home health OT;Supervision/Assistance - 24 hour (Pending progress)    Equipment Recommendations  None recommended by OT    Recommendations for Other Services PT consult    Precautions / Restrictions Precautions Precautions: Other (comment) Precaution Comments: Watch SpO2 and HR       Mobility Bed Mobility               General bed mobility comments: Received sitting in recliner  Transfers Overall transfer level: Needs assistance Equipment used: None Transfers: Sit to/from Stand Sit to Stand: Supervision         General transfer comment: Supervision for safety    Balance Overall balance assessment: Needs assistance Sitting-balance support: Feet supported Sitting balance-Leahy Scale: Good     Standing balance support: No upper extremity supported;During functional activity Standing balance-Leahy Scale: Fair                             ADL either performed or assessed with clinical judgement   ADL Overall ADL's : Needs assistance/impaired                         Toilet Transfer:  Supervision/safety;Ambulation;RW (simulated to recliner) Toilet Transfer Details (indicate cue type and reason): Cues and Supervision for safety.          Functional mobility during ADLs: Supervision/safety;Rolling walker General ADL Comments: Pt performing functional mobility out into hallway with Supervision and RW. Able to tolerate mobility ~6 feet and then requiring seated rest break due to HR elevating to 140s. SpO2 >86% on RA throughout     Vision   Vision Assessment?: No apparent visual deficits   Perception     Praxis      Cognition Arousal/Alertness: Awake/alert Behavior During Therapy: WFL for tasks assessed/performed Overall Cognitive Status: Within Functional Limits for tasks assessed  Pt more flat today. Reports he is tired and not sleeping well. Also reports that his younger sister - who has been fighting cancer since 2019 - called him earlier to say goodbye as she will be passing away in the next few days.                                           Exercises Exercises: Other exercises   Shoulder Instructions       General Comments SpO2 94-86% on RA. HR 90 at rest; elevating to 140s with activity. RR 20-30s.    Pertinent Vitals/ Pain       Pain Assessment: No/denies  pain  Home Living                                          Prior Functioning/Environment              Frequency  Min 2X/week        Progress Toward Goals  OT Goals(current goals can now be found in the care plan section)  Progress towards OT goals: Progressing toward goals  Acute Rehab OT Goals Patient Stated Goal: "get better" OT Goal Formulation: With patient Time For Goal Achievement: 04/10/20 Potential to Achieve Goals: Good ADL Goals Pt Will Perform Grooming: with modified independence;standing;sitting Pt Will Transfer to Toilet: with modified independence;ambulating;regular height toilet Pt Will Perform Toileting - Clothing Manipulation  and hygiene: with modified independence;sitting/lateral leans;sit to/from stand Pt/caregiver will Perform Home Exercise Program: Increased strength;Independently;With written HEP provided;Both right and left upper extremity Additional ADL Goal #1: Pt will independently monitor SpO2 and use purse lip breathing for ADLs Additional ADL Goal #2: Pt will independently verbalize three energy conservation techniques for ADLs and IADLs  Plan Discharge plan remains appropriate    Co-evaluation                 AM-PAC OT "6 Clicks" Daily Activity     Outcome Measure   Help from another person eating meals?: A Little Help from another person taking care of personal grooming?: A Little Help from another person toileting, which includes using toliet, bedpan, or urinal?: A Little Help from another person bathing (including washing, rinsing, drying)?: A Little Help from another person to put on and taking off regular upper body clothing?: A Little Help from another person to put on and taking off regular lower body clothing?: A Little 6 Click Score: 18    End of Session Equipment Utilized During Treatment: Rolling walker  OT Visit Diagnosis: Other (comment) (decreased cardiopulmonary tolerance)   Activity Tolerance Patient tolerated treatment well   Patient Left in chair;with call bell/phone within reach   Nurse Communication Mobility status        Time: 3220-2542 OT Time Calculation (min): 43 min  Charges: OT General Charges $OT Visit: 1 Visit OT Treatments $Therapeutic Activity: 38-52 mins  Starlyn Droge MSOT, OTR/L Acute Rehab Pager: [phone] Office: [place] 03/29/2020, 5:42 PM

## 2020-03-29 NOTE — Consult Note (Addendum)
Cardiology Consultation:   Patient ID: Robert [name] MRN: [id number]; DOB: [birth year]  Admit date: 03/23/2020 Date of Consult: 03/29/2020  Primary Care Provider: Cassandria Anger, MD [facility] Cardiologist: Buford Dresser, MD  [place] Electrophysiologist:  None    Patient Profile:   Robert [name] is a 69 y.o. male with a hx of prior DVT/PE, dyslipidemia, obesity, PSVT,  HTN who is being seen today for the evaluation of atrial fib at the request of Dr. Kevan Rosebush.  History of Present Illness:   Robert [name] with above hx and last seen by Dr. Harrell Gave a year ago.  Hx of Echo 2019 with EF 60-65%, no RWMA, mild AS very mild, mild MR. Mild to moderate LA enlargement.     On monitor 2019 had SR with SVT X 9 episodes 45 sec or so --Monitor noted 6 episodes of wide complex tachycardia; these are labeled as VT but are different morphology from the majority of his PVCs and bigeminy/trigeminy. Could be VT vs. SVT with aberrancy, given that these events occurred at a faster heart rate than his predominant SVT. The fastest WCT lasted 15 beats with a max rate of 197 bpm (avg 164 bpm); the run with the fastest interval was also the longest.  .  Normal stress echo 2012.    Now re-admitted 03/23/20  -was hospitalized 9/25/to 03/22/20 with AMS due to hypoxia and COVID 19 PNA.  Treated with steroids, remdesivir and baricitnib.  He improved and was discharged with tapering prednisone.  INR at discharge was 2.7.    Presented to ER 03/23/20 by EMS with coughing and SOB.  SPp2 was in high 80s.    EKG:  The EKG was personally reviewed and demonstrates:  Looking back to 9/25 EKG was tachycardia  At 137, possible atrial tach - on 03/23/20 was in atrial fib RVR and no acute ST changes.  Today a fib rate 101 an dnot acute ST changes.   Telemetry:  Telemetry was personally reviewed and demonstrates:  Looking back to tele 03/22/20 pt was in atrial fib at that time as well.    DDimer was 23  but neg PE  INR 10/2 was 3.2 and today 1.9 has been changed to lovenox 140 every and ddimer >20  CTA this admit with no PE+ COVID 19 PNA aortic atherosclerosis and coronary artery calcifications.  CXR 105/21  IMPRESSION: Multifocal airspace opacity consistent with atypical organism pneumonia, stable. Stable cardiac prominence. No adenopathy evident.  Echo 03/24/20 EF 55-60%, no RWMA, RV normal, Lt atrial size mild to moderately dilated.  Mild to mod AS  No MR  Venous doppler neg for DVT   Labs today Na 136, K+ 4.2, BUN 34, Cr 1.22 albumin 2.8 AST 42, ALT 93  WBC 17.1, Hgb 15.6, plts 210.   Pt on chronic coumadin at home.   HR today 120-150s   BP 158/76 P 92 afebrile   He is neg 900 since admit he had lasix 40 IV daily, one time doses  He is aware of heart tracing at times.  Discussed atrial fib.  He is allergic to BB and dilt.    Past Medical History:  Diagnosis Date  . Anxiety   . Asthma    "no attack since acupuncture in 1990's"  . Complication of anesthesia    SLOW TO WAKE UP / DIFFICULTY RESPONDING 2003, 3 days before could use left arm, "wild/crazy sometimes"  . Cyst of left kidney    "benign"  . DVT (  deep venous thrombosis) ([place]) 2003   right femoral artery "from groin to knee"  . Edema    Right Leg w/ h/o DVT postphlebitic  . H/O blood transfusion reaction 2004   FFP, extreme swelling, could not breath  . Headache(784.0)    MIGRAINES-not for a long time  . History of pulmonary embolism 2003   both lungs  . Hyperlipidemia   . Knee pain    left  . LBP ([name] back pain)   . 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 Obesity   . Osteoarthritis   . Peripheral vascular disease ([place])    "poor circulation in  RT leg"  . PONV (postoperative nausea and vomiting)    during surgery in 1990's  . Spondylolisthesis   . Warfarin anticoagulation   . Wheezing    when laying on left side    Past Surgical History:  Procedure Laterality Date  . APPENDECTOMY   1993   Dr Margot Chimes  . CHOLECYSTECTOMY  1993  . FINGER SURGERY  2012   RT THUMB RECONSTRUCTION  . HAND SURGERY Right    abcess  . HEMORROIDECTOMY  1981  . I & D KNEE WITH POLY EXCHANGE Left 04/04/2013   Procedure: IRRIGATION AND DEBRIDEMENT KNEE WITH POLY EXCHANGE AND INSERTION OF CEMENT BEADS;  Surgeon: Gearlean Alf, MD;  Location: [facility];  Service: Orthopedics;  Laterality: Left;  . KNEE ARTHROSCOPY  1990   RT KNEE  . KNEE ARTHROSCOPY Right 2003  . KNEE ARTHROSCOPY  1988   L KNEE  . KNEE ARTHROSCOPY WITH MENISCAL REPAIR Left 10/22/2012   Procedure: LEFT KNEE ARTHROSCOPY PARTIAL MEDIAL AND LATERAL MENISCECTOMY AND DEBRIDEMENT, CHONDROPLASTY ;  Surgeon: Johnn Hai, MD;  Location: [facility];  Service: Orthopedics;  Laterality: Left;  . LUMBAR DISC SURGERY    . LUMBAR FUSION  2003   Dr Maxie Better  . ORCHIECTOMY  1994   R post injury  . TONSILLECTOMY  1967  . TOTAL KNEE ARTHROPLASTY Left 03/21/2013   Procedure: LEFT TOTAL KNEE ARTHROPLASTY;  Surgeon: Gearlean Alf, MD;  Location: [facility];  Service: Orthopedics;  Laterality: Left;     Home Medications:  Prior to Admission medications   Medication Sig Start Date End Date Taking? Authorizing Provider  albuterol (PROVENTIL) (2.5 MG/3ML) 0.083% nebulizer solution Take 3 mLs (2.5 mg total) by nebulization every 4 (four) hours as needed for wheezing or shortness of breath. Dx  K02.542 02/16/20   [name], [name], MD  albuterol (VENTOLIN HFA) 108 (90 Base) MCG/ACT inhaler Inhale 2 puffs into the lungs every 6 (six) hours as needed for wheezing or shortness of breath. 03/22/20   Ghimire, Henreitta Leber, MD  benzonatate (TESSALON PERLES) 100 MG capsule Take 1 capsule (100 mg total) by mouth every 6 (six) hours as needed for cough. 03/22/20 03/22/21  Ghimire, Henreitta Leber, MD  Cholecalciferol (VITAMIN D3) 125 MCG (5000 UT) CAPS Take 1 capsule (5,000 Units total) by mouth daily. 12/25/19   [name], [name], MD  fish oil-omega-3 fatty acids 1000 MG capsule  Take 2 g by mouth every morning.    [provider]  fluticasone (FLONASE) 50 MCG/ACT nasal spray Place 2 sprays into both nostrils daily. Patient taking differently: Place 2 sprays into both nostrils daily as needed for allergies.  04/12/18   [name], [name], MD  furosemide (LASIX) 20 MG tablet Take 1 tablet (20 mg total) by mouth daily. 07/12/19   [name], [name],  MD  glucosamine-chondroitin 500-400 MG tablet Take 1 tablet by mouth every morning.    [provider]  nortriptyline (PAMELOR) 10 MG capsule Take 1 capsule (10 mg total) by mouth every evening. 07/12/19   [name], [name], MD  predniSONE (DELTASONE) 10 MG tablet 4 tablets (40 mg) daily for 2 days, 3 tablets (30 mg) daily for 2 days, 2 tablets (20 mg) daily for 2 days, 1 tablet (10 mg) daily for 2 days-and then resume 0.5 tablet (5 mg) daily 03/22/20   Ghimire, Henreitta Leber, MD  warfarin (COUMADIN) 3 MG tablet Take 1 tablet on Mon Wed and Fri or as directed by anticoagulation clinic 02/01/20   [name], [name], MD  warfarin (COUMADIN) 6 MG tablet As directed Patient taking differently: Take 6 mg by mouth See admin instructions. As directed; Lenna Sciara and Sat 02/01/20   [name], [name], MD    Inpatient Medications: Scheduled Meds: . baricitinib  4 mg Oral Daily  . enoxaparin (LOVENOX) injection  140 mg Subcutaneous Q12H  . famotidine  20 mg Oral Daily  . guaiFENesin  600 mg Oral BID  . mouth rinse  15 mL Mouth Rinse BID  . methylPREDNISolone (SOLU-MEDROL) injection  60 mg Intravenous Q12H  . nortriptyline  25 mg Oral QPM  . sodium chloride flush  3 mL Intravenous Q12H   Continuous Infusions:  PRN Meds: acetaminophen, ALPRAZolam, benzonatate, guaiFENesin-dextromethorphan  Allergies:    Allergies  Allergen Reactions  . Cat Hair Extract Itching  . Diltiazem Anaphylaxis  . Sulfa Antibiotics Hives and Swelling  . Anoro Ellipta [Umeclidinium-Vilanterol]     Too much mucus  . Breo Ellipta  [Fluticasone Furoate-Vilanterol]     Too much mucus  . Exalgo [Hydromorphone Hcl] Nausea Only    Sick and nauseated  . Fentanyl Other (See Comments)    REACTION: too sleepy  . Gabapentin   . Hydrocodone-Acetaminophen Nausea Only    REACTION: nausea  . Lopressor [Metoprolol Tartrate]     Asthmatic issues   . Penicillins Hives  . Sulfonamide Derivatives Hives and Swelling  . Tylenol [Acetaminophen]     Liver issues    Social History:   Social History   Socioeconomic History  . Marital status: Married    Spouse name: Not on file  . Number of children: 3  . Years of education: Not on file  . Highest education level: Not on file  Occupational History  . Occupation: Disabled    Employer: DISABLED  Tobacco Use  . Smoking status: Never Smoker  . Smokeless tobacco: Never Used  Vaping Use  . Vaping Use: Never used  Substance and Sexual Activity  . Alcohol use: No  . Drug use: No  . Sexual activity: Yes  Other Topics Concern  . Not on file  Social History Narrative   Right handed   One story home   Drinks caffeine coffee q am   Social Determinants of Health   Financial Resource Strain:   . Difficulty of Paying Living Expenses: Not on file  Food Insecurity:   . Worried About Charity fundraiser in the Last Year: Not on file  . Ran Out of Food in the Last Year: Not on file  Transportation Needs:   . Lack of Transportation (Medical): Not on file  . Lack of Transportation (Non-Medical): Not on file  Physical Activity:   . Days of Exercise per Week: Not on file  . Minutes of Exercise per Session: Not on file  Stress:   . Feeling of Stress : Not on file  Social Connections:   . Frequency of Communication with Friends and Family: Not on file  . Frequency of Social Gatherings with Friends and Family: Not on file  . Attends Religious Services: Not on file  . Active Member of Clubs or Organizations: Not on file  . Attends Archivist Meetings: Not on file  .  Marital Status: Not on file  Intimate Partner Violence:   . Fear of Current or Ex-Partner: Not on file  . Emotionally Abused: Not on file  . Physically Abused: Not on file  . Sexually Abused: Not on file    Family History:    Family History  Problem Relation Age of Onset  . Heart disease Mother        CABG at age 22  . Hyperlipidemia Other   . Hypertension Other   . Parkinsonism Other   . Heart disease Sister      ROS:  Please see the history of present illness.  General:no colds or fevers, no weight changes Skin:no rashes or ulcers HEENT:no blurred vision, no congestion CV:see HPI PUL:see HPI GI:no diarrhea constipation or melena, no indigestion GU:no hematuria, no dysuria MS:no joint pain, no claudication Neuro:no syncope, no lightheadedness Endo:no diabetes, no thyroid disease  All other ROS reviewed and negative.     Physical Exam/Data:   Vitals:   03/29/20 0759 03/29/20 0810 03/29/20 1142 03/29/20 1622  BP:  124/71 (!) 145/77 (!) 158/76  Pulse: (!) 109 92 96 96  Resp: 19 20 17 19   Temp:  (!) 97.5 F (36.4 C) 97.9 F (36.6 C) 97.7 F (36.5 C)  TempSrc:  Oral Oral Oral  SpO2: 90% 90% 91% 94%  Weight:      Height:        Intake/Output Summary (Last 24 hours) at 03/29/2020 1739 Last data filed at 03/29/2020 1342 Gross per 24 hour  Intake 1329 ml  Output 670 ml  Net 659 ml   Last 3 Weights 03/29/2020 03/18/2020 03/17/2020  Weight (lbs) 306 lb 324 lb 11.8 oz 318 lb  Weight (kg) 138.801 kg 147.3 kg 144.244 kg     Body mass index is 42.68 kg/m.  talked to pt on his cell phone   Relevant CV Studies: Echo 03/24/20 IMPRESSIONS    1. Left ventricular ejection fraction, by estimation, is 55 to 60%. The  left ventricle has normal function. The left ventricle has no regional  wall motion abnormalities. Left ventricular diastolic parameters are  indeterminate.  2. Right ventricular systolic function is normal. The right ventricular  size is normal.  3.  Left atrial size was mild to moderately dilated.  4. The mitral valve is normal in structure. No evidence of mitral valve  regurgitation. No evidence of mitral stenosis.  5. The aortic valve has an indeterminant number of cusps. There is  moderate calcification of the aortic valve. There is moderate thickening  of the aortic valve. Aortic valve regurgitation is not visualized. Mild to  moderate aortic valve stenosis.Aortic  valve mean gradient measures 19.3 mmHg. Aortic valve peak gradient  measures 32.1 mmHg. Aortic valve area, by VTI measures 1.26 cm.   FINDINGS  Left Ventricle: Left ventricular ejection fraction, by estimation, is 55  to 60%. The left ventricle has normal function. The left ventricle has no  regional wall motion abnormalities. The left ventricular internal cavity  size was normal in size. There is  no left ventricular hypertrophy.  Left ventricular diastolic parameters  are indeterminate.   Right Ventricle: The right ventricular size is normal. No increase in  right ventricular wall thickness. Right ventricular systolic function is  normal.   Left Atrium: Left atrial size was mild to moderately dilated.   Right Atrium: Right atrial size was normal in size.   Pericardium: There is no evidence of pericardial effusion.   Mitral Valve: The mitral valve is normal in structure. No evidence of  mitral valve regurgitation. No evidence of mitral valve stenosis.   Tricuspid Valve: The tricuspid valve is normal in structure. Tricuspid  valve regurgitation is not demonstrated. No evidence of tricuspid  stenosis.   Aortic Valve: The aortic valve has an indeterminant number of cusps. There  is moderate calcification of the aortic valve. There is moderate  thickening of the aortic valve. There is moderate aortic valve annular  calcification. Aortic valve regurgitation  is not visualized. Mild to moderate aortic stenosis is present. Aortic  valve mean gradient measures  19.3 mmHg. Aortic valve peak gradient  measures 32.1 mmHg. Aortic valve area, by VTI measures 1.26 cm.   Pulmonic Valve: The pulmonic valve was not well visualized. Pulmonic valve  regurgitation is not visualized. No evidence of pulmonic stenosis.   Aorta: The aortic root is normal in size and structure.   Pulmonary Artery: Indeterminant PASP, inadequate TR jet.   IAS/Shunts: The interatrial septum was not well visualized.      Laboratory Data:  High Sensitivity Troponin:   Recent Labs  Lab 03/23/20 0703 03/23/20 0949  TROPONINIHS 41* 41*     Chemistry Recent Labs  Lab 03/25/20 0500 03/25/20 0500 03/26/20 0444 03/26/20 0444 03/27/20 0328 03/28/20 0524 03/29/20 0204  NA 137   < > 137   < > 136 135 136  K 4.3   < > 4.0   < > 4.1 4.0 4.2  CL 99   < > 99   < > 98 95* 94*  CO2 28   < > 26   < > 23 28 30   GLUCOSE 180*   < > 195*   < > 168* 171* 167*  BUN 23   < > 33*   < > 36* 35* 34*  CREATININE 1.00   < > 1.20   < > 1.06 1.04 1.22  CALCIUM 8.4*   < > 8.6*   < > 8.3* 8.5* 8.5*  GFRNONAA >60   < > >60   < > >60 >60 >60  GFRAA >60  --  >60  --  >60  --   --   ANIONGAP 10   < > 12   < > 15 12 12    < > = values in this interval not displayed.    Recent Labs  Lab 03/27/20 0328 03/28/20 0524 03/29/20 0204  PROT 6.3* 6.5 6.5  ALBUMIN 2.5* 2.6* 2.8*  AST 36 45* 42*  ALT 74* 89* 93*  ALKPHOS 49 54 55  BILITOT 1.2 1.5* 1.6*   Hematology Recent Labs  Lab 03/26/20 0444 03/27/20 0328 03/28/20 0524  WBC 17.7* 17.0* 17.1*  RBC 4.88 4.69 4.74  HGB 15.7 15.2 15.6  HCT 45.9 44.7 44.6  MCV 94.1 95.3 94.1  MCH 32.2 32.4 32.9  MCHC 34.2 34.0 35.0  RDW 12.6 12.4 12.4  PLT 268 225 210   BNPNo results for input(s): BNP, PROBNP in the last 168 hours.  DDimer  Recent Labs  Lab 03/27/20 7322 03/28/20 0254 03/29/20 0204  DDIMER >  20.00* >20.00* >20.00*     Radiology/Studies:  DG Chest Port 1 View  Result Date: 03/27/2020 CLINICAL DATA:  COVID-19 pneumonia EXAM:  PORTABLE CHEST 1 VIEW COMPARISON:  Chest radiograph and chest CT March 23, 2020 FINDINGS: Widespread patchy airspace opacity remains, slightly more on the left than on the right. No new opacity evident. Heart is enlarged, stable, with pulmonary vascularity normal. No adenopathy. No bone lesions. IMPRESSION: Multifocal airspace opacity consistent with atypical organism pneumonia, stable. Stable cardiac prominence. No adenopathy evident. Electronically Signed   By: Lowella Grip III M.D.   On: 03/27/2020 07:55     Assessment and Plan:   1. Atrial fib with RVR, appears to have started on last admit, EKG with possible a tach or flutter but tele reveals a fib, rate controlled on strips.  On return to ER again with tachycardia and a fib.  He on tele from what I see has a fib and today HR was up 120s to 150s.  - difficult with allergy to lopressor and dilt.   Was on coumadin for hx DVT/PE but now on full dose of lovenox.  Will need to continue coumadin.  We will begin amiodarone 400 mg BID  with his anaphylaxis to dilt and intolerance to lopressor with asthma. Afib may improve post infection, with plan once improved and back to therapeutic INR for 3-4 weeks plan DCCV.   2. Hx of SVT  3. Elevated troponin at 41 and flat, CTA of chest with with coronary atherosclerosis, may need ischemic eval once improved. No chest pain 4. COVID 19 PNA recovering. Per IM 5. Elevated DDimer with neg Venosus doppler and neg CTA of chest   6. Hx DVT/PE on chronic coumadin  7. Hx bronchial asthma per IM 8. situational anxiety with gravely ill sister with cancer 9. Morbid obesity  10. 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 Recent acute metabolic encephalopathy .            CHA2DS2-VASc Score = 2  This indicates a 2.2% annual risk of stroke. The patient's score is based upon: CHF History: 0 HTN History: 1 Diabetes History: 0 Stroke History: 0 Vascular Disease History: 0 Age Score:  1 Gender Score: 0         For questions or updates, please contact [place] Please consult [email or web address] for contact info under    Signed, Cecilie Kicks, NP  03/29/2020 5:39 PM  Spoke to him via telephonic device in order to reduce Covid exposure and reduce PPD burden.  Time spent-40 minutes review of medical records, discussion with patient, discussion with Cecilie Kicks, NP, review of telemetry personally.  Atrial fibrillation noted with rapid ventricular response.  Upon review of monitoring from earlier hospitalization, atrial fibrillation was noted transiently.  Currently, still with cough, treating Covid pneumonia, steroid.  Has also had SVT in the past.  Different rhythm than atrial fibrillation.  Physical exam:  General-Pleasant, alert and oriented x3 Respiratory-conversant on phone, occasional cough.  Assessment and plan:  Paroxysmal atrial fibrillation with RVR -He had diltiazem anaphylaxis -He had issues with asthma exacerbation with metoprolol. -I think it makes sense for [place] to utilize amiodarone at least for the short-term, will load 400 twice daily.  This will help with rate control and ultimately may help with conversion to sinus rhythm. -As he improves from a Covid perspective, his atrial fibrillation should follow.  Candee Furbish, MD

## 2020-03-29 NOTE — Progress Notes (Signed)
°   03/29/20 0540  Mobility  Activity Stood at bedside  Level of Assistance Standby assist, set-up cues, supervision of patient - no hands on  Assistive Device None  Minutes Sat in Chair 160 minutes (This shift.)  Minutes Stood 1 minutes (Approximate.)  Mobility Response Tolerated fair   O2 titrated from 8L to 2L [place] this shift. SpO2 mostly 89-93% on 2L while sitting in chair. Patient stood for weight. HR as high as 118, SpO2 as low as 87% on O2 2L [place]. BLE edematous. Patient encouraged to elevate legs. BLE were elevated earlier but dangling most shift. Patient resting in chair at this time, no distress noted. SpO2 89% on O2 2L [place]. Denies needs. Will continue to monitor.

## 2020-03-29 NOTE — Progress Notes (Addendum)
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  He was down to about 3 L of oxygen.  He was discharged with home oxygen.  Apparently became more short of breath at home.  Called EMS.  Oxygen requirements were higher.  He was subsequently hospitalized for further management.  Reason for Visit: Pneumonia due to COVID-19.  Acute respiratory failure with hypoxia  Consultants: None  Procedures: None  Antibiotics: Anti-infectives (From admission, onward)   None      Subjective/Interval History: Patient mentions that she is feeling better from a respiratory standpoint.  Heart rate noted to be somewhat elevated this morning.  Denies any palpitations or chest pain.  No nausea vomiting.  Cough seems to be improving.     Assessment/Plan:  Acute Hypoxic Resp. Failure/Pneumonia due to COVID-19   Recent Labs  Lab 03/23/20 0703 03/23/20 0703 03/23/20 0900 03/23/20 1647 03/24/20 0500 03/24/20 0500 03/25/20 0500 03/26/20 0444 03/27/20 0328 03/28/20 0524 03/28/20 0634 03/29/20 0204  DDIMER  --    < >   < > 2.74* 17.44*   < > >20.00* >20.00* >20.00*  --  >20.00* >20.00*  FERRITIN 1,502*  --   --   --  1,648*  --   --   --   --   --   --   --   CRP 0.9   < >  --   --  10.7*  --  8.7* 4.0* <0.5 0.6  --   --   ALT 203*   < >  --   --  150*   < > 107* 81* 74* 89*  --  93*  PROCALCITON <0.10  --   --  0.11  --   --   --   --   --   --   --   --    < > = values in this interval not displayed.    Objective findings: Oxygen requirements: Noted to be down to 2 L of oxygen by nasal cannula saturating in the late 80s to early 90s.    COVID 19 Therapeutics: Antibacterials: None.  Procalcitonin  0.11 Remdesivir: Recently completed a 5-day course Steroids: Solu-Medrol Diuretics: Lasix given the last 4 days.  We will hold for now. Inhaled Steroids: Dulera Baricitinib: Initially started on 9/26.  Resumed on 10/1 after discussion of risks and benefits.  Will complete a total of 14 days or till discharge whichever is earlier. PUD Prophylaxis: Pepcid DVT Prophylaxis: On warfarin prior to admission.  Lovenox for now.  Patient had to be rehospitalized due to worsening respiratory symptoms.  From a respiratory standpoint patient seems to be making some progress.  He is on less oxygen today compared to few days ago.  He has completed course of Remdesivir.  Remains on Solu-Medrol and baricitinib.  D-dimer persistently greater than 20.  CRP down to normal.  Chest x-ray done 2 days ago showed stable findings.  Leukocytosis is due to steroids.  CT angiogram done at the time of admission did not show any PE.  Lower extremity Doppler studies were negative for DVT.  INR has been therapeutic.  No other thrombotic process has been noted.  Elevated D-dimer most likely due to COVID-19.  Continue to mobilize, incentive  spirometry, PT and OT evaluation.  The treatment plan and use of medications and known side effects were discussed with patient/family. Some of the medications used are based on case reports/anecdotal data.  All other medications being used in the management of COVID-19 based on limited study data.  Complete risks and long-term side effects are unknown, however in the best clinical judgment they seem to be of some benefit.  Patient/family wanted to proceed with treatment options provided.  Transaminitis Most likely due to COVID-19.  Stable.  Continue to monitor periodically.  Essential hypertension Blood pressure is reasonably well controlled.  Continue to monitor.  Significant lower extremity edema Echocardiogram showed normal systolic function without any significant valvular  abnormalities.  Lower extremity Doppler study negative for DVT.  Keep legs elevated as much as possible.  Some improvement noted with Lasix.  History of MAT/SVT/paroxysmal atrial fibrillation/ a flutter Patient does have a history of MAT and SVT but no previously documented history of atrial fibrillation.. EKG done few days ago did show atrial fibrillation.  Continue telemetry monitoring.  TSH 1.16.   Looks like he has been seen by Dr. Harrell Gave previously with cardiology.  Not noted to be on any AV blocking medications.   Noted to be tachycardic this morning.  EKG is pending.  Patient noted to have reports of anaphylactic reaction to diltiazem.  He also mentions a severe asthmatic attack when he takes metoprolol.  Depending on findings on the EKG may need to discuss with cardiology as to how to manage his tachycardia.  May need to use agents such as digoxin.  EKG suggests afib vs aflutter. Discussed with Dr. Sallyanne Kuster. Cardiology will consult.  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  CT angiogram was negative.  Lower extremity Dopplers negative for DVT.  Currently on Lovenox.  History of bronchial asthma Stable.  No wheezing appreciated at this time.  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  Holding his prednisone while he is getting Solu-Medrol.  Recent acute metabolic encephalopathy Apparently was confused during his previous hospitalization.  Seems to be at baseline currently.  Situational anxiety Apparently patient sister is quite sick with terminal cancer and is under hospice care at home.  She might pass away soon.  Patient with occasional crying episodes.  Pamelor dose was increased yesterday.  Continue alprazolam as needed.  Steroids likely contributing and will be tapered down once his respiratory status shows consistent improvement.  Seems to be stable.  Morbid  obesity Estimated body mass index is 42.68 kg/m as calculated from the following:   Height as of this encounter: 5\' 11"  (1.803 m).   Weight as of this encounter: 138.8 kg.   DVT Prophylaxis: Therapeutic Lovenox Code Status: Limited resuscitation: No intubation but all other interventions are fine.   Family Communication: Wife being updated daily.   Disposition Plan: Hopefully return home when improved  Status is: Inpatient  Remains inpatient appropriate because:IV treatments appropriate due to intensity of illness or inability to take PO and Inpatient level of care appropriate due to severity of illness   Dispo:  Patient From: Home  Planned Disposition: Home  Expected discharge date: 03/30/20  Medically stable for discharge: No        Medications:  Scheduled: . baricitinib  4 mg Oral Daily  . enoxaparin (LOVENOX) injection  140 mg Subcutaneous Q12H  . famotidine  20 mg Oral Daily  . guaiFENesin  600 mg Oral BID  . mouth rinse  15 mL Mouth Rinse BID  . methylPREDNISolone (SOLU-MEDROL) injection  80 mg Intravenous Q12H  . nortriptyline  25 mg Oral QPM  . sodium chloride flush  3 mL Intravenous Q12H   Continuous:  FOY:DXAJOINOMVEHM, ALPRAZolam, benzonatate, guaiFENesin-dextromethorphan   Objective:  Vital Signs  Vitals:   03/29/20 0605 03/29/20 0735 03/29/20 0759 03/29/20 0810  BP:  (!) 145/92  124/71  Pulse: 92 83 (!) 109 92  Resp: 19 12 19 20   Temp:  97.6 F (36.4 C)  (!) 97.5 F (36.4 C)  TempSrc:  Oral  Oral  SpO2: (!) 89% 91% 90% 90%  Weight:      Height:        Intake/Output Summary (Last 24 hours) at 03/29/2020 1048 Last data filed at 03/29/2020 0900 Gross per 24 hour  Intake 1149 ml  Output 1020 ml  Net 129 ml   Filed Weights   03/29/20 0407  Weight: (!) 138.8 kg    General appearance: Awake alert.  In no distress Resp: Normal effort at rest.  Coarse breath sounds with crackles at the bases.  No wheezing or rhonchi. Cardio: S1-S2 is  tachycardic and irregular.  No S3-S4.  No rubs murmurs or bruit GI: Abdomen is soft.  Nontender nondistended.  Bowel sounds are present normal.  No masses organomegaly Extremities: Improved edema bilateral lower extremities Neurologic: Alert and oriented x3.  No focal neurological deficits.      Lab Results:  Data Reviewed: I have personally reviewed following labs and imaging studies  CBC: Recent Labs  Lab 03/24/20 0500 03/25/20 0500 03/26/20 0444 03/27/20 0328 03/28/20 0524  WBC 14.2* 13.5* 17.7* 17.0* 17.1*  NEUTROABS 12.3* 12.1* 16.1* 15.3* 15.2*  HGB 15.4 15.7 15.7 15.2 15.6  HCT 45.6 46.0 45.9 44.7 44.6  MCV 94.2 94.3 94.1 95.3 94.1  PLT 253 273 268 225 094    Basic Metabolic Panel: Recent Labs  Lab 03/24/20 0500 03/24/20 0500 03/25/20 0500 03/26/20 0444 03/27/20 0328 03/28/20 0524 03/29/20 0204  NA 139   < > 137 137 136 135 136  K 4.5   < > 4.3 4.0 4.1 4.0 4.2  CL 102   < > 99 99 98 95* 94*  CO2 27   < > 28 26 23 28 30   GLUCOSE 143*   < > 180* 195* 168* 171* 167*  BUN 17   < > 23 33* 36* 35* 34*  CREATININE 0.94   < > 1.00 1.20 1.06 1.04 1.22  CALCIUM 8.4*   < > 8.4* 8.6* 8.3* 8.5* 8.5*  MG 2.2  --  2.2 2.3 2.3 2.4  --   PHOS 3.5  --   --   --   --   --   --    < > = values in this interval not displayed.    GFR: Estimated Creatinine Clearance: 82.5 mL/min (by C-G formula based on SCr of 1.22 mg/dL).  Liver Function Tests: Recent Labs  Lab 03/25/20 0500 03/26/20 0444 03/27/20 0328 03/28/20 0524 03/29/20 0204  AST 31 24 36 45* 42*  ALT 107* 81* 74* 89* 93*  ALKPHOS 55 54 49 54 55  BILITOT 1.4* 1.2 1.2 1.5* 1.6*  PROT 6.8 6.6 6.3* 6.5 6.5  ALBUMIN 2.5* 2.6* 2.5* 2.6* 2.8*    Coagulation Profile: Recent Labs  Lab 03/24/20 0500 03/26/20 1935 03/27/20 0328 03/28/20 0634 03/29/20 0204  INR 3.2* 3.2* 3.1* 2.3* 1.9*  Recent Results (from the past 240 hour(s))  Blood Culture (routine x 2)     Status: None   Collection Time: 03/23/20   7:40 AM   Specimen: BLOOD  Result Value Ref Range Status   Specimen Description BLOOD RIGHT ANTECUBITAL  Final   Special Requests   Final    BOTTLES DRAWN AEROBIC AND ANAEROBIC Blood Culture results may not be optimal due to an inadequate volume of blood received in culture bottles   Culture   Final    NO GROWTH 5 DAYS Performed at [facility], [place] [address] [place], [place] [address]    Report Status 03/28/2020 FINAL  Final  Blood Culture (routine x 2)     Status: None   Collection Time: 03/23/20  8:00 AM   Specimen: BLOOD  Result Value Ref Range Status   Specimen Description BLOOD LEFT ANTECUBITAL  Final   Special Requests   Final    BOTTLES DRAWN AEROBIC AND ANAEROBIC Blood Culture adequate volume   Culture   Final    NO GROWTH 5 DAYS Performed at [facility], [place] [address], [place], [place] [address]    Report Status 03/28/2020 FINAL  Final  MRSA PCR Screening     Status: None   Collection Time: 03/24/20  6:54 PM   Specimen: Nasal Mucosa; Nasopharyngeal  Result Value Ref Range Status   MRSA by PCR NEGATIVE NEGATIVE Final    Comment:        The GeneXpert MRSA Assay (FDA approved for NASAL specimens only), is one component of a comprehensive MRSA colonization surveillance program. It is not intended to diagnose MRSA infection nor to guide or monitor treatment for MRSA infections. Performed at [facility], [place] [address] [place], [place] [address]       Radiology Studies: No results found.     LOS: 6 days   Nadav Swindell [employer] on [email or web address]  03/29/2020, 10:48 AM

## 2020-03-29 NOTE — Progress Notes (Signed)
Patient's HR noted to be in 120s-150s. Dr.Krishnan notified. EKG ordered. Will continue to monitor.

## 2020-03-29 NOTE — Progress Notes (Signed)
Pharmacy: Lovenox  Warfarin on hold for history of remote PE With Covid and D-dimer > 20  INR now 1.9  Plan:  Begin Lovenox 140 mg sq Q12 hours  Watch CBC  Thank you Anette Guarneri, PharmD [phone] 03/29/2020 8:40 AM

## 2020-03-29 NOTE — Care Management Important Message (Signed)
Important Message  Patient Details  Name: Robert [name] MRN: [id number] Date of Birth: Jan 02, 1951   Medicare Important Message Given:  Yes - Important Message mailed due to current National Emergency  Verbal consent obtained due to current National Emergency  Relationship to patient: Self Contact Name: Marlowe Lawes Call Date: 03/29/20  Time: 1509 Phone: [phone] Outcome: No Answer/Busy Important Message mailed to: Patient address on file    Delorse Lek 03/29/2020, 3:10 PM

## 2020-03-30 DIAGNOSIS — R000 Tachycardia, unspecified: Secondary | ICD-10-CM | POA: Diagnosis not present

## 2020-03-30 DIAGNOSIS — U071 COVID-19: Secondary | ICD-10-CM | POA: Diagnosis not present

## 2020-03-30 DIAGNOSIS — J1282 Pneumonia due to Coronavirus disease 2019: Secondary | ICD-10-CM | POA: Diagnosis not present

## 2020-03-30 DIAGNOSIS — J9601 Acute respiratory failure with hypoxia: Secondary | ICD-10-CM | POA: Diagnosis not present

## 2020-03-30 LAB — COMPREHENSIVE METABOLIC PANEL
ALT: 86 U/L — ABNORMAL HIGH (ref 0–44)
AST: 37 U/L (ref 15–41)
Albumin: 2.7 g/dL — ABNORMAL LOW (ref 3.5–5.0)
Alkaline Phosphatase: 52 U/L (ref 38–126)
Anion gap: 10 (ref 5–15)
BUN: 29 mg/dL — ABNORMAL HIGH (ref 8–23)
CO2: 29 mmol/L (ref 22–32)
Calcium: 8.4 mg/dL — ABNORMAL LOW (ref 8.9–10.3)
Chloride: 93 mmol/L — ABNORMAL LOW (ref 98–111)
Creatinine, Ser: 1.02 mg/dL (ref 0.61–1.24)
GFR calc non Af Amer: >60 mL/min (ref >60)
Glucose, Bld: 159 mg/dL — ABNORMAL HIGH (ref 70–99)
Potassium: 4.3 mmol/L (ref 3.5–5.1)
Sodium: 132 mmol/L — ABNORMAL LOW (ref 135–145)
Total Bilirubin: 1.6 mg/dL — ABNORMAL HIGH (ref 0.3–1.2)
Total Protein: 6.1 g/dL — ABNORMAL LOW (ref 6.5–8.1)

## 2020-03-30 LAB — PROTIME-INR
INR: 1.6 — ABNORMAL HIGH (ref 0.8–1.2)
Prothrombin Time: 18.7 seconds — ABNORMAL HIGH (ref 11.4–15.2)

## 2020-03-30 LAB — D-DIMER, QUANTITATIVE: D-Dimer, Quant: >20 ug/mL-FEU — ABNORMAL HIGH (ref 0.00–0.50)

## 2020-03-30 MED ORDER — SALINE SPRAY 0.65 % NA SOLN
1.0000 | NASAL | Status: DC | PRN
  Administered 2020-03-31: 1 via NASAL
  Filled 2020-03-30: qty 44

## 2020-03-30 MED ORDER — METHYLPREDNISOLONE SODIUM SUCC 40 MG IJ SOLR
40.0000 mg | Freq: Two times a day (BID) | INTRAMUSCULAR | Status: DC
  Administered 2020-03-30 – 2020-04-02 (×6): 40 mg via INTRAVENOUS
  Filled 2020-03-30 (×6): qty 1

## 2020-03-30 NOTE — Progress Notes (Signed)
Physical Therapy Treatment Patient Details Name: Robert [name] MRN: [id number] DOB: [birth year] Today's Date: 03/30/2020    History of Present Illness 69 yo male presenting via EMS with increased shortness of breath. Recent admission (9/26) with COVID-19 pneumonia and discharged (9/30) to home on 3L O2. 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    PT Comments    Pt reports increase fatigue and headache this afternoon. Pt eager to ambulate with therapy and discouraged when limited by bouts of tachycardia with short distance ambulation (132-147bpm). Discussed that progress with COVID is not a straight line and that there will be occasional set backs. Pt is supervision for transfers and min guard for ambulation with RW. D/c plan remains appropriate. PT will continue to follow acutely.  Pt on 1L O2 via [place] on entry SaO2 98%O2 with ambulation dropped to 83%O2, requires ~3 min to recover to 88O2, increased to 2L O2 for further ambulation and SaO2 only dropped to 86%O2 and rebounded much more quickly   Follow Up Recommendations  No PT follow up     Equipment Recommendations  Other (comment) Judie Petit Rollator)       Precautions / Restrictions Precautions Precautions: Other (comment) Precaution Comments: Watch SpO2 and HR Restrictions Weight Bearing Restrictions: No    Mobility  Bed Mobility               General bed mobility comments: Received sitting in recliner  Transfers Overall transfer level: Needs assistance Equipment used: None Transfers: Sit to/from Stand Sit to Stand: Supervision         General transfer comment: Supervision for safety  Ambulation/Gait Ambulation/Gait assistance: Min guard Gait Distance (Feet): 16 Feet (x4) Assistive device: None Gait Pattern/deviations: Step-through pattern;Decreased stride length Gait velocity: Decreased   General Gait Details: min guard for safety, vc for upright posture and proximity to  RW         Balance Overall balance assessment: Needs assistance Sitting-balance support: Feet supported Sitting balance-Leahy Scale: Good     Standing balance support: No upper extremity supported;During functional activity Standing balance-Leahy Scale: Fair                              Cognition Arousal/Alertness: Awake/alert Behavior During Therapy: WFL for tasks assessed/performed Overall Cognitive Status: Within Functional Limits for tasks assessed                                           General Comments General comments (skin integrity, edema, etc.): Pt on 1L O2 via [place] on entry, SaO2 98%O2 with ambulation to door dropped to 83%O2, increased to 2 L for further ambulation and dropped to 86% with bouts of ambulation able to quickly recover with purse lip breathing as HR decreased, HR at rest 79 bpm, elevated to 147bpm with 16' ambulation, 135 bpm with second bout of ambulation and 132 with subsequent 2 bouts of ambulation, quickly recovered to 90s bpm with rest and purse lip breathing       Pertinent Vitals/Pain Pain Assessment: 0-10 Pain Score: 7  Pain Location: forehead, headache  Pain Descriptors / Indicators: Headache Pain Intervention(s): Limited activity within patient's tolerance;Monitored during session;Premedicated before session           PT Goals (current goals can now be found in the care plan section) Acute Rehab  PT Goals Patient Stated Goal: "get better" PT Goal Formulation: With patient Time For Goal Achievement: 04/09/20 Potential to Achieve Goals: Good Progress towards PT goals: Not progressing toward goals - comment (limited by tachycardia )    Frequency    Min 3X/week      PT Plan Current plan remains appropriate       AM-PAC PT "6 Clicks" Mobility   Outcome Measure  Help needed turning from your back to your side while in a flat bed without using bedrails?: None Help needed moving from lying on your back  to sitting on the side of a flat bed without using bedrails?: None Help needed moving to and from a bed to a chair (including a wheelchair)?: None Help needed standing up from a chair using your arms (e.g., wheelchair or bedside chair)?: None Help needed to walk in hospital room?: A Little Help needed climbing 3-5 steps with a railing? : A Little 6 Click Score: 22    End of Session Equipment Utilized During Treatment: Oxygen Activity Tolerance: Other (comment) (limited by tachycardia) Patient left: in chair;with call bell/phone within reach;with nursing/sitter in room Nurse Communication: Mobility status PT Visit Diagnosis: Other abnormalities of gait and mobility (R26.89)     Time: 3888-2800 PT Time Calculation (min) (ACUTE ONLY): 28 min  Charges:  $Gait Training: 8-22 mins $Therapeutic Exercise: 8-22 mins                     Bernadett Milian B. Migdalia Dk PT, DPT Acute Rehabilitation Services Pager [phone] Office [phone]    [place] 03/30/2020, 2:48 PM

## 2020-03-30 NOTE — Progress Notes (Addendum)
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  He was down to about 3 L of oxygen.  He was discharged with home oxygen.  Apparently became more short of breath at home.  Called EMS.  Oxygen requirements were higher.  He was subsequently hospitalized for further management.  Reason for Visit: Pneumonia due to COVID-19.  Acute respiratory failure with hypoxia  Consultants: Cardiology  Procedures: None  Antibiotics: Anti-infectives (From admission, onward)   None      Subjective/Interval History: Patient mentions that he is about the same as yesterday.  Had a little trouble this morning with cough and difficulty breathing but better now.  Denies any chest pain currently.     Assessment/Plan:  Acute Hypoxic Resp. Failure/Pneumonia due to COVID-19   Recent Labs  Lab 03/23/20 1647 03/23/20 1647 03/24/20 0500 03/24/20 0500 03/25/20 0500 03/25/20 0500 03/26/20 0444 03/27/20 0328 03/28/20 0524 03/28/20 0634 03/29/20 0204 03/30/20 0457  DDIMER 2.74*   < > 17.44*   < > >20.00*   < > >20.00* >20.00*  --  >20.00* >20.00* >20.00*  FERRITIN  --   --  1,648*  --   --   --   --   --   --   --   --   --   CRP  --   --  10.7*  --  8.7*  --  4.0* <0.5 0.6  --   --   --   ALT  --   --  150*   < > 107*   < > 81* 74* 89*  --  93* 86*  PROCALCITON 0.11  --   --   --   --   --   --   --   --   --   --   --    < > = values in this interval not displayed.    Objective findings: Oxygen requirements: Nasal cannula 1 to 2 L saturating in the early 90s.     COVID 19 Therapeutics: Antibacterials: None.  Procalcitonin 0.11 Remdesivir: Recently completed a 5-day course Steroids:  Solu-Medrol Diuretics: Lasix being given intermittently to keep in negative fluid balance.  Not given in the last 2 days. Inhaled Steroids: Dulera Baricitinib: Initially started on 9/26.  Resumed on 10/1 after discussion of risks and benefits.  Will complete a total of 14 days or till discharge whichever is earlier. PUD Prophylaxis: Pepcid DVT Prophylaxis: On warfarin prior to admission.  Lovenox for now.  Patient had to be rehospitalized due to worsening respiratory symptoms.  From a respiratory standpoint patient seems to have improved.  His oxygen requirements have made significant improvement and is now down to 1 to 2 L of oxygen.  He has completed course of Remdesivir.  He remains on Solu-Medrol and baricitinib.  We will start tapering down steroids.  D-dimer remains persistently elevated.  CT angiogram done at the time of admission did not show any PE.  Lower extremity Doppler studies were negative for DVT.  INR was therapeutic at admission.  Currently is on therapeutic Lovenox.  No other thrombotic process has been noted.  Elevated D-dimer most likely due to COVID-19.  CRP had improved to 0.6. Continue to mobilize, incentive spirometry, PT and  OT evaluation.  The treatment plan and use of medications and known side effects were discussed with patient/family. Some of the medications used are based on case reports/anecdotal data.  All other medications being used in the management of COVID-19 based on limited study data.  Complete risks and long-term side effects are unknown, however in the best clinical judgment they seem to be of some benefit.  Patient/family wanted to proceed with treatment options provided.  Atrial fibrillation with RVR/concern of atrial flutter/history of MAT/PSVT  Patient does have a history of MAT and SVT but no previously documented history of atrial fibrillation.. EKG done few days ago did show atrial fibrillation.  TSH 1.16.   Looks like he has been seen by Dr.  Harrell Gave previously with cardiology.  Not noted to be on any AV blocking medications.   Yesterday patient was noted to be tachycardic.  EKG suggested atrial fibrillation although there was some concern for flutter waves.   Patient noted to have reports of anaphylactic reaction to diltiazem.  He also mentions a severe asthmatic attack when he takes metoprolol.   Cardiology was subsequently consulted.  Patient started on amiodarone.  Heart rate seems to be stable currently.  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  CT angiogram was negative.  Lower extremity Dopplers negative for DVT.  Currently on Lovenox.  Transaminitis Most likely due to COVID-19.  Stable.  Continue to monitor periodically.  Essential hypertension Blood pressure is reasonably well controlled.  Continue to monitor.  Lower extremity edema Echocardiogram showed normal systolic function without any significant valvular abnormalities.  Lower extremity Doppler study negative for DVT.  Keep legs elevated as much as possible.  Improvement noted with Lasix.  History of bronchial asthma Stable.  No wheezing appreciated at this time.  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  Holding his prednisone while he is getting Solu-Medrol.  Recent acute metabolic encephalopathy Apparently was confused during his previous hospitalization.  Seems to be at baseline currently.  Situational anxiety Apparently patient sister is quite sick with terminal cancer and is under hospice care at home.  She might pass away soon.  Patient with occasional crying episodes.  Pamelor dose was increased.  Continue alprazolam as needed.  Hopefully tapering of steroids will also help.  Morbid obesity Estimated body mass index is 42.68 kg/m as calculated from the following:   Height as of this encounter: 5\' 11"  (1.803 m).   Weight as of this  encounter: 138.8 kg.   DVT Prophylaxis: Therapeutic Lovenox Code Status: Limited resuscitation: No intubation but all other interventions are fine.   Family Communication: Wife being updated daily.   Disposition Plan: Hopefully return home when improved.  Noted to be very deconditioned with increase in heart rate noted yesterday with minimal exertion.  Needs to be mobilized more before he can be safely discharged.    Status is: Inpatient  Remains inpatient appropriate because:IV treatments appropriate due to intensity of illness or inability to take PO and Inpatient level of care appropriate due to severity of illness   Dispo:  Patient From: Home  Planned Disposition: Home  Expected discharge date: 03/31/20  Medically stable for discharge: No        Medications:  Scheduled: . amiodarone  400 mg Oral BID  . baricitinib  4 mg Oral Daily  . enoxaparin (LOVENOX) injection  140 mg Subcutaneous Q12H  . famotidine  20 mg  Oral Daily  . guaiFENesin  600 mg Oral BID  . mouth rinse  15 mL Mouth Rinse BID  . methylPREDNISolone (SOLU-MEDROL) injection  60 mg Intravenous Q12H  . nortriptyline  25 mg Oral QPM  . sodium chloride flush  3 mL Intravenous Q12H   Continuous:  EQA:[id number], ALPRAZolam, benzonatate, guaiFENesin-dextromethorphan   Objective:  Vital Signs  Vitals:   03/29/20 2350 03/30/20 0200 03/30/20 0349 03/30/20 0735  BP:   (!) 131/93 125/81  Pulse: 76  86 85  Resp: 18 20 (!) 22 (!) 21  Temp:   97.6 F (36.4 C) 97.6 F (36.4 C)  TempSrc:   Oral Axillary  SpO2: 97% 92% 92% 93%  Weight:      Height:        Intake/Output Summary (Last 24 hours) at 03/30/2020 1000 Last data filed at 03/30/2020 0647 Gross per 24 hour  Intake 709 ml  Output 1550 ml  Net -841 ml   Filed Weights   03/29/20 0407  Weight: (!) 138.8 kg    General appearance: Awake alert.  In no distress Resp: Normal effort at rest.  Coarse breath sounds with crackles bilateral bases.  No  wheezing or rhonchi.   Cardio: 1 S2 is irregularly irregular.  No S3-S4.  No rubs or bruit. GI: Abdomen is soft.  Nontender nondistended.  Bowel sounds are present normal.  No masses organomegaly Extremities: Improved edema bilateral lower extremities Neurologic: Alert and oriented x3.  No focal neurological deficits.      Lab Results:  Data Reviewed: I have personally reviewed following labs and imaging studies  CBC: Recent Labs  Lab 03/24/20 0500 03/25/20 0500 03/26/20 0444 03/27/20 0328 03/28/20 0524  WBC 14.2* 13.5* 17.7* 17.0* 17.1*  NEUTROABS 12.3* 12.1* 16.1* 15.3* 15.2*  HGB 15.4 15.7 15.7 15.2 15.6  HCT 45.6 46.0 45.9 44.7 44.6  MCV 94.2 94.3 94.1 95.3 94.1  PLT 253 273 268 225 921    Basic Metabolic Panel: Recent Labs  Lab 03/24/20 0500 03/24/20 0500 03/25/20 0500 03/25/20 0500 03/26/20 0444 03/27/20 0328 03/28/20 0524 03/29/20 0204 03/30/20 0457  NA 139   < > 137   < > 137 136 135 136 132*  K 4.5   < > 4.3   < > 4.0 4.1 4.0 4.2 4.3  CL 102   < > 99   < > 99 98 95* 94* 93*  CO2 27   < > 28   < > 26 23 28 30 29   GLUCOSE 143*   < > 180*   < > 195* 168* 171* 167* 159*  BUN 17   < > 23   < > 33* 36* 35* 34* 29*  CREATININE 0.94   < > 1.00   < > 1.20 1.06 1.04 1.22 1.02  CALCIUM 8.4*   < > 8.4*   < > 8.6* 8.3* 8.5* 8.5* 8.4*  MG 2.2  --  2.2  --  2.3 2.3 2.4  --   --   PHOS 3.5  --   --   --   --   --   --   --   --    < > = values in this interval not displayed.    GFR: Estimated Creatinine Clearance: 98.7 mL/min (by C-G formula based on SCr of 1.02 mg/dL).  Liver Function Tests: Recent Labs  Lab 03/26/20 0444 03/27/20 0328 03/28/20 0524 03/29/20 0204 03/30/20 0457  AST 24 36 45* 42* 37  ALT 81*  74* 89* 93* 86*  ALKPHOS 54 49 54 55 52  BILITOT 1.2 1.2 1.5* 1.6* 1.6*  PROT 6.6 6.3* 6.5 6.5 6.1*  ALBUMIN 2.6* 2.5* 2.6* 2.8* 2.7*    Coagulation Profile: Recent Labs  Lab 03/26/20 1935 03/27/20 0328 03/28/20 0634 03/29/20 0204  03/30/20 0457  INR 3.2* 3.1* 2.3* 1.9* 1.6*     Recent Results (from the past 240 hour(s))  Blood Culture (routine x 2)     Status: None   Collection Time: 03/23/20  7:40 AM   Specimen: BLOOD  Result Value Ref Range Status   Specimen Description BLOOD RIGHT ANTECUBITAL  Final   Special Requests   Final    BOTTLES DRAWN AEROBIC AND ANAEROBIC Blood Culture results may not be optimal due to an inadequate volume of blood received in culture bottles   Culture   Final    NO GROWTH 5 DAYS Performed at [facility], [place] [address], [place], [place] [address]    Report Status 03/28/2020 FINAL  Final  Blood Culture (routine x 2)     Status: None   Collection Time: 03/23/20  8:00 AM   Specimen: BLOOD  Result Value Ref Range Status   Specimen Description BLOOD LEFT ANTECUBITAL  Final   Special Requests   Final    BOTTLES DRAWN AEROBIC AND ANAEROBIC Blood Culture adequate volume   Culture   Final    NO GROWTH 5 DAYS Performed at [facility], [place] [address], [place], [place] [address]    Report Status 03/28/2020 FINAL  Final  MRSA PCR Screening     Status: None   Collection Time: 03/24/20  6:54 PM   Specimen: Nasal Mucosa; Nasopharyngeal  Result Value Ref Range Status   MRSA by PCR NEGATIVE NEGATIVE Final    Comment:        The GeneXpert MRSA Assay (FDA approved for NASAL specimens only), is one component of a comprehensive MRSA colonization surveillance program. It is not intended to diagnose MRSA infection nor to guide or monitor treatment for MRSA infections. Performed at [facility], [place] [address], [place], [place] [address]       Radiology Studies: No results found.     LOS: 7 days   Laurieanne Galloway [employer] on [email or web address]  03/30/2020, 10:00 AM

## 2020-03-30 NOTE — Progress Notes (Signed)
Progress Note  Patient Name: Robert [name] Date of Encounter: 03/30/2020  [facility] Cardiologist: Buford Dresser, MD   Interviewed patient over telephonic device secondary to COVID-19 precautions  Subjective   Feeling better today.  Less short of breath.  Can actually talk without having to stop in mid sentence.  Heart rate is improved with atrial fibrillation.  Inpatient Medications    Scheduled Meds: . amiodarone  400 mg Oral BID  . baricitinib  4 mg Oral Daily  . enoxaparin (LOVENOX) injection  140 mg Subcutaneous Q12H  . famotidine  20 mg Oral Daily  . guaiFENesin  600 mg Oral BID  . mouth rinse  15 mL Mouth Rinse BID  . methylPREDNISolone (SOLU-MEDROL) injection  40 mg Intravenous Q12H  . nortriptyline  25 mg Oral QPM  . sodium chloride flush  3 mL Intravenous Q12H   Continuous Infusions:  PRN Meds: acetaminophen, ALPRAZolam, benzonatate, guaiFENesin-dextromethorphan   Vital Signs    Vitals:   03/29/20 2350 03/30/20 0200 03/30/20 0349 03/30/20 0735  BP:   (!) 131/93 125/81  Pulse: 76  86 85  Resp: 18 20 (!) 22 (!) 21  Temp:   97.6 F (36.4 C) 97.6 F (36.4 C)  TempSrc:   Oral Axillary  SpO2: 97% 92% 92% 93%  Weight:      Height:        Intake/Output Summary (Last 24 hours) at 03/30/2020 1045 Last data filed at 03/30/2020 0900 Gross per 24 hour  Intake 949 ml  Output 1550 ml  Net -601 ml   Last 3 Weights 03/29/2020 03/18/2020 03/17/2020  Weight (lbs) 306 lb 324 lb 11.8 oz 318 lb  Weight (kg) 138.801 kg 147.3 kg 144.244 kg      Telemetry    Atrial fibrillation heart rate in the 80s to 100 improve- Personally Reviewed  ECG    No new- Personally Reviewed  Physical Exam   Alert and oriented x3, pleasant.  Mildly increased respiratory effort noted over telephone.  Labs    High Sensitivity Troponin:   Recent Labs  Lab 03/23/20 0703 03/23/20 0949  TROPONINIHS 41* 41*      Chemistry Recent Labs  Lab 03/25/20 0500  03/25/20 0500 03/26/20 0444 03/26/20 0444 03/27/20 0328 03/27/20 0328 03/28/20 0524 03/29/20 0204 03/30/20 0457  NA 137   < > 137   < > 136   < > 135 136 132*  K 4.3   < > 4.0   < > 4.1   < > 4.0 4.2 4.3  CL 99   < > 99   < > 98   < > 95* 94* 93*  CO2 28   < > 26   < > 23   < > 28 30 29   GLUCOSE 180*   < > 195*   < > 168*   < > 171* 167* 159*  BUN 23   < > 33*   < > 36*   < > 35* 34* 29*  CREATININE 1.00   < > 1.20   < > 1.06   < > 1.04 1.22 1.02  CALCIUM 8.4*   < > 8.6*   < > 8.3*   < > 8.5* 8.5* 8.4*  PROT 6.8   < > 6.6   < > 6.3*   < > 6.5 6.5 6.1*  ALBUMIN 2.5*   < > 2.6*   < > 2.5*   < > 2.6* 2.8* 2.7*  AST 31   < >  24   < > 36   < > 45* 42* 37  ALT 107*   < > 81*   < > 74*   < > 89* 93* 86*  ALKPHOS 55   < > 54   < > 49   < > 54 55 52  BILITOT 1.4*   < > 1.2   < > 1.2   < > 1.5* 1.6* 1.6*  GFRNONAA >60   < > >60   < > >60   < > >60 >60 >60  GFRAA >60  --  >60  --  >60  --   --   --   --   ANIONGAP 10   < > 12   < > 15   < > 12 12 10    < > = values in this interval not displayed.     Hematology Recent Labs  Lab 03/26/20 0444 03/27/20 0328 03/28/20 0524  WBC 17.7* 17.0* 17.1*  RBC 4.88 4.69 4.74  HGB 15.7 15.2 15.6  HCT 45.9 44.7 44.6  MCV 94.1 95.3 94.1  MCH 32.2 32.4 32.9  MCHC 34.2 34.0 35.0  RDW 12.6 12.4 12.4  PLT 268 225 210    BNPNo results for input(s): BNP, PROBNP in the last 168 hours.   DDimer  Recent Labs  Lab 03/28/20 [phone] 03/29/20 0204 03/30/20 0457  DDIMER >20.00* >20.00* >20.00*    Cardiac Studies   Echo with normal EF on 2020/03/29 mildly dilated left atrium  Patient Profile     69 y.o. male with Covid pneumonia, elevated D-dimer, atrial fibrillation  Assessment & Plan    Persistent atrial fibrillation/SVT -New diagnosis. -Amiodarone load 400 mg twice daily started on 03/29/2020.  Would recommend 1 week of 400 twice daily, 2 weeks of 200 twice daily, then 200 mg thereafter -Hopefully amiodarone will be short-term.  This may help  him convert.  He is on anticoagulation already for prior DVT PE on Coumadin.  Currently on Lovenox. -Has had prior episodes of SVT as well. -Has not been able to tolerate metoprolol because of asthma -Diltiazem causes anaphylaxis.   Elevated troponin -41, not acute coronary syndrome, secondary to demand ischemia in the setting of Covid pneumonia.  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  Morbid obesity -Continue to encourage weight loss.  BMI of 42.   Total time spent 21 minutes with review of records, discussion with patient via telephonic device.  For questions or updates, please contact [place] Please consult [email or web address] for contact info under        Signed, Candee Furbish, MD  03/30/2020, 10:45 AM

## 2020-03-30 NOTE — TOC Transition Note (Signed)
Transition of Care [facility]) - CM/SW Discharge Note   Patient Details  Name: Robert [name] MRN: [id number] Date of Birth: September 07, 1950  [facility]) CM/SW Contact:  Hyman Hopes, RN Phone Number: 03/30/2020, 1:18 PM   Clinical Narrative:    Patient admitted for acute hypoxemic respiratory failure due to COVID-19.  Recent hospitalization with services provided.  Case manager spoke to patient on phone due to [place].  Patient lives with wife who will be transportation home, has PCP, only equipment that patient has prior to this admission is oxygen.  Called [facility], Thedore Mins, and verifed that will be resumption of services.  Called [facility], Amy, and verified that patient was receiving home health PT/OT and that this will resume post discharge.  Orders for oxygen and home health have been verified in computer.  TOC following for progression of care.  Expected DC plan:  Home with home health services, oxygen   Final next level of care: [place] Barriers to Discharge: No Barriers Identified   Patient Goals and CMS Choice Patient states their goals for this hospitalization and ongoing recovery are:: to go home CMS Medicare.gov Compare Post Acute Care list provided to:: Patient Choice offered to / list presented to : Patient  Discharge Placement                       Discharge Plan and Services   Discharge Planning Services: CM Consult Post Acute Care Choice: Home Health, Durable Medical Equipment                    HH Arranged: PT, OT [facility] Agency: [facility] Date [place]: 03/28/20 Time [place]: 1139 Representative spoke with at [place]: Amy  Social Determinants of Health ([place]) Interventions     Readmission Risk Interventions No flowsheet data found.

## 2020-03-30 NOTE — Progress Notes (Signed)
Patient has tolerated well all shift on O2 1L [place]. This RN encouraged patient to try RA last night and this morning. Patient requesting to stay on the O2, even only 1L. Patient not wanting to get anxious. Patient agrees to try RA later.

## 2020-03-31 DIAGNOSIS — J9601 Acute respiratory failure with hypoxia: Secondary | ICD-10-CM | POA: Diagnosis not present

## 2020-03-31 DIAGNOSIS — U071 COVID-19: Secondary | ICD-10-CM | POA: Diagnosis not present

## 2020-03-31 DIAGNOSIS — I4819 Other persistent atrial fibrillation: Secondary | ICD-10-CM | POA: Diagnosis not present

## 2020-03-31 LAB — D-DIMER, QUANTITATIVE: D-Dimer, Quant: 17.55 ug/mL-FEU — ABNORMAL HIGH (ref 0.00–0.50)

## 2020-03-31 LAB — COMPREHENSIVE METABOLIC PANEL
ALT: 89 U/L — ABNORMAL HIGH (ref 0–44)
AST: 38 U/L (ref 15–41)
Albumin: 2.6 g/dL — ABNORMAL LOW (ref 3.5–5.0)
Alkaline Phosphatase: 51 U/L (ref 38–126)
Anion gap: 10 (ref 5–15)
BUN: 30 mg/dL — ABNORMAL HIGH (ref 8–23)
CO2: 28 mmol/L (ref 22–32)
Calcium: 8.3 mg/dL — ABNORMAL LOW (ref 8.9–10.3)
Chloride: 96 mmol/L — ABNORMAL LOW (ref 98–111)
Creatinine, Ser: 1.07 mg/dL (ref 0.61–1.24)
GFR, Estimated: >60 mL/min (ref >60)
Glucose, Bld: 162 mg/dL — ABNORMAL HIGH (ref 70–99)
Potassium: 4.5 mmol/L (ref 3.5–5.1)
Sodium: 134 mmol/L — ABNORMAL LOW (ref 135–145)
Total Bilirubin: 1.5 mg/dL — ABNORMAL HIGH (ref 0.3–1.2)
Total Protein: 6.3 g/dL — ABNORMAL LOW (ref 6.5–8.1)

## 2020-03-31 LAB — CBC
HCT: 43.3 % (ref 39.0–52.0)
Hemoglobin: 15.1 g/dL (ref 13.0–17.0)
MCH: 32.9 pg (ref 26.0–34.0)
MCHC: 34.9 g/dL (ref 30.0–36.0)
MCV: 94.3 fL (ref 80.0–100.0)
Platelets: 246 10*3/uL (ref 150–400)
RBC: 4.59 MIL/uL (ref 4.22–5.81)
RDW: 12.2 % (ref 11.5–15.5)
WBC: 20.2 10*3/uL — ABNORMAL HIGH (ref 4.0–10.5)
nRBC: 0 % (ref 0.0–0.2)

## 2020-03-31 LAB — MAGNESIUM: Magnesium: 2.5 mg/dL — ABNORMAL HIGH (ref 1.7–2.4)

## 2020-03-31 LAB — PROTIME-INR
INR: 1.4 — ABNORMAL HIGH (ref 0.8–1.2)
Prothrombin Time: 16.4 seconds — ABNORMAL HIGH (ref 11.4–15.2)

## 2020-03-31 MED ORDER — WARFARIN SODIUM 6 MG PO TABS
6.0000 mg | ORAL_TABLET | Freq: Once | ORAL | Status: AC
  Administered 2020-03-31: 6 mg via ORAL
  Filled 2020-03-31: qty 1

## 2020-03-31 MED ORDER — WARFARIN - PHARMACIST DOSING INPATIENT: Freq: Every day | Status: DC

## 2020-03-31 NOTE — Progress Notes (Signed)
Progress Note  Patient Name: Robert [name] Date of Encounter: 03/31/2020  [facility] Cardiologist: Buford Dresser, MD   Subjective   Robert [name] is a 69 y.o. male with a hx of prior DVT/PE, dyslipidemia, obesity, PSVT,  HTN, Mild AS, who was evaluated 03/29/20 in the setting of new A fib with COVID Pneumonia who is being seen today for the evaluation of atrial fib at the request of Dr. Kevan Rosebush.  Feeling better today.  Working on getting his strength back.  Asymptomatic when HR in 120s.    Inpatient Medications    Scheduled Meds:  amiodarone  400 mg Oral BID   baricitinib  4 mg Oral Daily   enoxaparin (LOVENOX) injection  140 mg Subcutaneous Q12H   famotidine  20 mg Oral Daily   guaiFENesin  600 mg Oral BID   mouth rinse  15 mL Mouth Rinse BID   methylPREDNISolone (SOLU-MEDROL) injection  40 mg Intravenous Q12H   nortriptyline  25 mg Oral QPM   sodium chloride flush  3 mL Intravenous Q12H   warfarin  6 mg Oral ONCE-1600   Warfarin - Pharmacist Dosing Inpatient   Does not apply q1600   Continuous Infusions:  PRN Meds: acetaminophen, ALPRAZolam, benzonatate, guaiFENesin-dextromethorphan, sodium chloride   Vital Signs    Vitals:   03/30/20 2027 03/31/20 0104 03/31/20 0415 03/31/20 0754  BP: 121/80 (!) 126/97 127/84   Pulse: 96 75 81   Resp: 20 16 18    Temp: 97.6 F (36.4 C) 97.8 F (36.6 C) 97.6 F (36.4 C) 98 F (36.7 C)  TempSrc: Oral Axillary Axillary Axillary  SpO2: 90% 90% 90%   Weight:      Height:        Intake/Output Summary (Last 24 hours) at 03/31/2020 0947 Last data filed at 03/31/2020 0836 Gross per 24 hour  Intake 600 ml  Output 1775 ml  Net -1175 ml   Last 3 Weights 03/29/2020 03/18/2020 03/17/2020  Weight (lbs) 306 lb 324 lb 11.8 oz 318 lb  Weight (kg) 138.801 kg 147.3 kg 144.244 kg      Telemetry    Atrial fibrillation heart rate in the 80s to 124 occasional [place] beats improve- Personally Reviewed  ECG      No new- Personally Reviewed  Physical Exam   General appearance: Awake alert.  In no distress Resp: Coarse breath sounds bilaterally with no wheezes; seen on breathing treatement Can speak full sentences Cardio: irregularly irregular but rate controlled  No rubs or bruit. GI: Abdomen is soft.  Nontender nondistended. Extremities: +1 edema bilateral lower extremities Neurologic: Alert and oriented x3.  No focal neurological deficits.   Labs    High Sensitivity Troponin:   Recent Labs  Lab 03/23/20 0703 03/23/20 0949  TROPONINIHS 41* 41*      Chemistry Recent Labs  Lab 03/25/20 0500 03/25/20 0500 03/26/20 0444 03/26/20 0444 03/27/20 0328 03/28/20 0524 03/29/20 0204 03/30/20 0457 03/31/20 0411  NA 137   < > 137   < > 136   < > 136 132* 134*  K 4.3   < > 4.0   < > 4.1   < > 4.2 4.3 4.5  CL 99   < > 99   < > 98   < > 94* 93* 96*  CO2 28   < > 26   < > 23   < > 30 29 28   GLUCOSE 180*   < > 195*   < > 168*   < >  167* 159* 162*  BUN 23   < > 33*   < > 36*   < > 34* 29* 30*  CREATININE 1.00   < > 1.20   < > 1.06   < > 1.22 1.02 1.07  CALCIUM 8.4*   < > 8.6*   < > 8.3*   < > 8.5* 8.4* 8.3*  PROT 6.8   < > 6.6   < > 6.3*   < > 6.5 6.1* 6.3*  ALBUMIN 2.5*   < > 2.6*   < > 2.5*   < > 2.8* 2.7* 2.6*  AST 31   < > 24   < > 36   < > 42* 37 38  ALT 107*   < > 81*   < > 74*   < > 93* 86* 89*  ALKPHOS 55   < > 54   < > 49   < > 55 52 51  BILITOT 1.4*   < > 1.2   < > 1.2   < > 1.6* 1.6* 1.5*  GFRNONAA >60   < > >60   < > >60   < > >60 >60 >60  GFRAA >60  --  >60  --  >60  --   --   --   --   ANIONGAP 10   < > 12   < > 15   < > 12 10 10    < > = values in this interval not displayed.     Hematology Recent Labs  Lab 03/27/20 0328 03/28/20 0524 03/31/20 0411  WBC 17.0* 17.1* 20.2*  RBC 4.69 4.74 4.59  HGB 15.2 15.6 15.1  HCT 44.7 44.6 43.3  MCV 95.3 94.1 94.3  MCH 32.4 32.9 32.9  MCHC 34.0 35.0 34.9  RDW 12.4 12.4 12.2  PLT 225 210 246    DDimer  Recent Labs  Lab  03/29/20 0204 03/30/20 0457 03/31/20 0411  DDIMER >20.00* >20.00* 17.55*    Cardiac Studies   Echo with normal EF on Apr 16, 2020 mildly dilated left atrium, mild AS  Patient Profile     69 y.o. male with Covid pneumonia, elevated D-dimer, atrial fibrillation  Assessment & Plan    Persistent atrial fibrillation/SVT -New diagnosis. -Amiodarone load 400 mg twice daily started on 03/29/2020.  Would recommend 1 week of 400 twice daily (04/06/20), 2 weeks of 200 twice daily (04/27/20), then 200 mg thereafter -Hopefully amiodarone will be medium-term.  He is on anticoagulation already for prior DVT PE on Coumadin.  Currently on Lovenox and warfarin  -Has not been able to tolerate metoprolol because of asthma in the past -Allergy: Diltiazem causes anaphylaxis.  Covid PNA Elevated troponin -41X2 , not acute coronary syndrome, secondary to demand ischemia in the setting of Covid pneumonia.  Prior DVT PE -AC as above  Morbid obesity -Continue to encourage weight loss.  BMI of 42.  For questions or updates, please contact [place] Please consult [email or web address] for contact info under        Signed, Werner Lean, MD  03/31/2020, 9:47 AM

## 2020-03-31 NOTE — Progress Notes (Signed)
Pharmacy Anticoagulation ote  69 yo m on warfarin PTA for hx of PE Has been on hold, but now with a fib  Has been on enoxaparin treatment dose ddimer been elevated for multiple days  PTA warfarin 6 mg daily except 3 mg MW  Plan:  Continue enoxaparin 1 mg/kg q12h Resume warfarin 6 mg x 1 Daily INR  Barth Kirks, PharmD, BCPS, BCCCP Clinical Pharmacist [phone]  Please check AMION for all [place] numbers  03/31/2020 7:39 AM

## 2020-03-31 NOTE — Progress Notes (Signed)
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  He was down to about 3 L of oxygen.  He was discharged with home oxygen.  Apparently became more short of breath at home.  Called EMS.  Oxygen requirements were higher.  He was subsequently hospitalized for further management.  Reason for Visit: Pneumonia due to COVID-19.  Acute respiratory failure with hypoxia  Consultants: Cardiology  Procedures: None  Antibiotics: Anti-infectives (From admission, onward)   None      Subjective/Interval History:  Patient in bed, appears comfortable, denies any headache, no fever, no chest pain or pressure, no shortness of breath , no abdominal pain. No focal weakness.    Assessment/Plan:  Acute Hypoxic Resp. Failure/Pneumonia due to COVID-19 -unvaccinated and unfortunately has incurred severe parenchymal lung injury due to COVID-19 pneumonia, being treated with IV steroids, Remdesivir and Baricitinib.  Clinical situation although tenuous but seems to have somewhat stabilized.  Encouraged to sit up in chair use I-S and flutter valve for pulmonary toiletry.  Continue to monitor closely.  SpO2: 91 % O2 Flow Rate (L/min): 1 L/min   Recent Labs  Lab 03/25/20 0500 03/25/20 0500 03/26/20 0444 03/26/20 1935 03/27/20 0328 03/28/20 0524 03/28/20 0634 03/29/20 0204 03/30/20 0457 03/31/20 0411  WBC 13.5*  --  17.7*  --  17.0* 17.1*  --   --   --  20.2*  CRP 8.7*  --  4.0*  --  <0.5 0.6  --   --   --   --   DDIMER >20.00*   < > >20.00*  --  >20.00*  --  >20.00* >20.00* >20.00* 17.55*  AST 31   < > 24  --  36 45*  --  42* 37 38  ALT 107*   < > 81*  --  74* 89*  --  93* 86* 89*  ALKPHOS 55   < > 54  --   49 54  --  55 52 51  BILITOT 1.4*   < > 1.2  --  1.2 1.5*  --  1.6* 1.6* 1.5*  ALBUMIN 2.5*   < > 2.6*  --  2.5* 2.6*  --  2.8* 2.7* 2.6*  INR  --   --   --    < > 3.1*  --  2.3* 1.9* 1.6* 1.4*   < > = values in this interval not displayed.         Elevated D-dimer due to intense inflammation.  CTA and leg ultrasound unremarkable currently on Lovenox, long-term on Coumadin at home.   Atrial fibrillation with RVR/concern of atrial flutter/history of MAT/PSVT -Mali vas 2 score of greater than 2, also has history of previous DVTs.. Patient does have a history of MAT and SVT but no previously documented history of atrial fibrillation. Stabile TSH and echocardiogram, cardiology on board.  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 1 week of 400 twice daily (04/06/20), 2 weeks of 200 twice daily (04/27/20), then 200 mg thereafter.  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  CT angiogram was negative.  Lower extremity Dopplers negative for DVT.  Currently on Lovenox.  Chronically on Coumadin.  Transaminitis Most likely due  to COVID-19.  Stable.  Continue to monitor periodically.  Essential hypertension Blood pressure is reasonably well controlled.  Continue to monitor.  Lower extremity edema Echocardiogram showed normal systolic function without any significant valvular abnormalities.  Lower extremity Doppler study negative for DVT.  Keep legs elevated as much as possible.  Improvement noted with Lasix.  History of bronchial asthma Stable.  No wheezing appreciated at this time.  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  Holding his prednisone while he is getting Solu-Medrol.  Recent acute metabolic encephalopathy Apparently was confused during his previous hospitalization.  Seems to be at baseline currently.  Situational  anxiety Apparently patient sister is quite sick with terminal cancer and is under hospice care at home.  She might pass away soon.  Patient with occasional crying episodes.  Pamelor dose was increased.  Continue alprazolam as needed.  Hopefully tapering of steroids will also help.  Morbid obesity Estimated body mass index is 42.68 kg/m as calculated from the following:   Height as of this encounter: 5\' 11"  (1.803 m).   Weight as of this encounter: 138.8 kg.   DVT Prophylaxis: Therapeutic Lovenox Code Status: Limited resuscitation: No intubation but all other interventions are fine.   Family Communication: Wife being updated daily.   Disposition Plan: Hopefully return home when improved.  Noted to be very deconditioned with increase in heart rate noted yesterday with minimal exertion.  Needs to be mobilized more before he can be safely discharged.    Status is: Inpatient  Remains inpatient appropriate because:IV treatments appropriate due to intensity of illness or inability to take PO and Inpatient level of care appropriate due to severity of illness   Dispo:  Patient From: Home  Planned Disposition: Home  Expected discharge date: 04/01/20  Medically stable for discharge: No        Medications:  Scheduled:  amiodarone  400 mg Oral BID   enoxaparin (LOVENOX) injection  140 mg Subcutaneous Q12H   famotidine  20 mg Oral Daily   guaiFENesin  600 mg Oral BID   mouth rinse  15 mL Mouth Rinse BID   methylPREDNISolone (SOLU-MEDROL) injection  40 mg Intravenous Q12H   nortriptyline  25 mg Oral QPM   sodium chloride flush  3 mL Intravenous Q12H   warfarin  6 mg Oral ONCE-1600   Warfarin - Pharmacist Dosing Inpatient   Does not apply q1600   Continuous:  QIW:LNLGXQJJHERDE, ALPRAZolam, benzonatate, guaiFENesin-dextromethorphan, sodium chloride   Objective:  Vital Signs  Vitals:   03/31/20 0104 03/31/20 0415 03/31/20 0754 03/31/20 0800  BP: (!) 126/97 127/84   125/89  Pulse: 75 81  84  Resp: 16 18  16   Temp: 97.8 F (36.6 C) 97.6 F (36.4 C) 98 F (36.7 C)   TempSrc: Axillary Axillary Axillary   SpO2: 90% 90%  91%  Weight:      Height:        Intake/Output Summary (Last 24 hours) at 03/31/2020 1320 Last data filed at 03/31/2020 1254 Gross per 24 hour  Intake 1080 ml  Output 1075 ml  Net 5 ml   Filed Weights   03/29/20 0407  Weight: (!) 138.8 kg    Exam  Awake Alert, No new F.N deficits, Normal affect [place].AT,PERRAL Supple Neck,No JVD, No cervical lymphadenopathy appriciated.  Symmetrical Chest wall movement, Good air movement bilaterally, CTAB iRRR,No Gallops, Rubs or new Murmurs, No Parasternal Heave +ve B.Sounds, Abd Soft, No  tenderness, No organomegaly appriciated, No rebound - guarding or rigidity. No Cyanosis, Clubbing or edema, No new Rash or bruise   Lab Results:  Data Reviewed: I have personally reviewed following labs and imaging studies  CBC: Recent Labs  Lab 03/25/20 0500 03/26/20 0444 03/27/20 0328 03/28/20 0524 03/31/20 0411  WBC 13.5* 17.7* 17.0* 17.1* 20.2*  NEUTROABS 12.1* 16.1* 15.3* 15.2*  --   HGB 15.7 15.7 15.2 15.6 15.1  HCT 46.0 45.9 44.7 44.6 43.3  MCV 94.3 94.1 95.3 94.1 94.3  PLT 273 268 225 210 710    Basic Metabolic Panel: Recent Labs  Lab 03/25/20 0500 03/25/20 0500 03/26/20 0444 03/26/20 0444 03/27/20 0328 03/28/20 0524 03/29/20 0204 03/30/20 0457 03/31/20 0411  NA 137   < > 137   < > 136 135 136 132* 134*  K 4.3   < > 4.0   < > 4.1 4.0 4.2 4.3 4.5  CL 99   < > 99   < > 98 95* 94* 93* 96*  CO2 28   < > 26   < > 23 28 30 29 28   GLUCOSE 180*   < > 195*   < > 168* 171* 167* 159* 162*  BUN 23   < > 33*   < > 36* 35* 34* 29* 30*  CREATININE 1.00   < > 1.20   < > 1.06 1.04 1.22 1.02 1.07  CALCIUM 8.4*   < > 8.6*   < > 8.3* 8.5* 8.5* 8.4* 8.3*  MG 2.2  --  2.3  --  2.3 2.4  --   --  2.5*   < > = values in this interval not displayed.    GFR: Estimated Creatinine Clearance:  94.1 mL/min (by C-G formula based on SCr of 1.07 mg/dL).  Liver Function Tests: Recent Labs  Lab 03/27/20 0328 03/28/20 0524 03/29/20 0204 03/30/20 0457 03/31/20 0411  AST 36 45* 42* 37 38  ALT 74* 89* 93* 86* 89*  ALKPHOS 49 54 55 52 51  BILITOT 1.2 1.5* 1.6* 1.6* 1.5*  PROT 6.3* 6.5 6.5 6.1* 6.3*  ALBUMIN 2.5* 2.6* 2.8* 2.7* 2.6*    Coagulation Profile: Recent Labs  Lab 03/27/20 0328 03/28/20 0634 03/29/20 0204 03/30/20 0457 03/31/20 0411  INR 3.1* 2.3* 1.9* 1.6* 1.4*     Recent Results (from the past 240 hour(s))  Blood Culture (routine x 2)     Status: None   Collection Time: 03/23/20  7:40 AM   Specimen: BLOOD  Result Value Ref Range Status   Specimen Description BLOOD RIGHT ANTECUBITAL  Final   Special Requests   Final    BOTTLES DRAWN AEROBIC AND ANAEROBIC Blood Culture results may not be optimal due to an inadequate volume of blood received in culture bottles   Culture   Final    NO GROWTH 5 DAYS Performed at [facility], [place] [address] [place], [place] [address]    Report Status 03/28/2020 FINAL  Final  Blood Culture (routine x 2)     Status: None   Collection Time: 03/23/20  8:00 AM   Specimen: BLOOD  Result Value Ref Range Status   Specimen Description BLOOD LEFT ANTECUBITAL  Final   Special Requests   Final    BOTTLES DRAWN AEROBIC AND ANAEROBIC Blood Culture adequate volume   Culture   Final    NO GROWTH 5 DAYS Performed at [facility], [place] [address], [place], [place] [address]  Report Status 03/28/2020 FINAL  Final  MRSA PCR Screening     Status: None   Collection Time: 03/24/20  6:54 PM   Specimen: Nasal Mucosa; Nasopharyngeal  Result Value Ref Range Status   MRSA by PCR NEGATIVE NEGATIVE Final    Comment:        The GeneXpert MRSA Assay (FDA approved for NASAL specimens only), is one component of a comprehensive MRSA colonization surveillance program. It is not intended to diagnose MRSA infection nor to guide  or monitor treatment for MRSA infections. Performed at [facility], [place] [address] [place], [place] [address]       Radiology Studies: No results found.     LOS: 8 days   [facility] Pager on [email or web address]  03/31/2020, 1:20 PM

## 2020-04-01 DIAGNOSIS — U071 COVID-19: Secondary | ICD-10-CM | POA: Diagnosis not present

## 2020-04-01 DIAGNOSIS — I4819 Other persistent atrial fibrillation: Secondary | ICD-10-CM | POA: Diagnosis not present

## 2020-04-01 DIAGNOSIS — J9601 Acute respiratory failure with hypoxia: Secondary | ICD-10-CM | POA: Diagnosis not present

## 2020-04-01 LAB — CBC WITH DIFFERENTIAL/PLATELET
Abs Immature Granulocytes: 0.28 10*3/uL — ABNORMAL HIGH (ref 0.00–0.07)
Basophils Absolute: 0.1 10*3/uL (ref 0.0–0.1)
Basophils Relative: 0 %
Eosinophils Absolute: 0 10*3/uL (ref 0.0–0.5)
Eosinophils Relative: 0 %
HCT: 44.5 % (ref 39.0–52.0)
Hemoglobin: 15.5 g/dL (ref 13.0–17.0)
Immature Granulocytes: 1 %
Lymphocytes Relative: 3 %
Lymphs Abs: 0.5 10*3/uL — ABNORMAL LOW (ref 0.7–4.0)
MCH: 33 pg (ref 26.0–34.0)
MCHC: 34.8 g/dL (ref 30.0–36.0)
MCV: 94.7 fL (ref 80.0–100.0)
Monocytes Absolute: 0.8 10*3/uL (ref 0.1–1.0)
Monocytes Relative: 4 %
Neutro Abs: 18.8 10*3/uL — ABNORMAL HIGH (ref 1.7–7.7)
Neutrophils Relative %: 92 %
Platelets: 266 10*3/uL (ref 150–400)
RBC: 4.7 MIL/uL (ref 4.22–5.81)
RDW: 12.2 % (ref 11.5–15.5)
WBC: 20.4 10*3/uL — ABNORMAL HIGH (ref 4.0–10.5)
nRBC: 0 % (ref 0.0–0.2)

## 2020-04-01 LAB — COMPREHENSIVE METABOLIC PANEL
ALT: 99 U/L — ABNORMAL HIGH (ref 0–44)
AST: 39 U/L (ref 15–41)
Albumin: 2.6 g/dL — ABNORMAL LOW (ref 3.5–5.0)
Alkaline Phosphatase: 62 U/L (ref 38–126)
Anion gap: 13 (ref 5–15)
BUN: 27 mg/dL — ABNORMAL HIGH (ref 8–23)
CO2: 27 mmol/L (ref 22–32)
Calcium: 8.5 mg/dL — ABNORMAL LOW (ref 8.9–10.3)
Chloride: 96 mmol/L — ABNORMAL LOW (ref 98–111)
Creatinine, Ser: 0.98 mg/dL (ref 0.61–1.24)
GFR, Estimated: >60 mL/min (ref >60)
Glucose, Bld: 144 mg/dL — ABNORMAL HIGH (ref 70–99)
Potassium: 4.6 mmol/L (ref 3.5–5.1)
Sodium: 136 mmol/L (ref 135–145)
Total Bilirubin: 1.4 mg/dL — ABNORMAL HIGH (ref 0.3–1.2)
Total Protein: 6.2 g/dL — ABNORMAL LOW (ref 6.5–8.1)

## 2020-04-01 LAB — BRAIN NATRIURETIC PEPTIDE: B Natriuretic Peptide: 76.8 pg/mL (ref 0.0–100.0)

## 2020-04-01 LAB — C-REACTIVE PROTEIN: CRP: 0.5 mg/dL (ref <1.0)

## 2020-04-01 LAB — D-DIMER, QUANTITATIVE: D-Dimer, Quant: 9.8 ug/mL-FEU — ABNORMAL HIGH (ref 0.00–0.50)

## 2020-04-01 LAB — PROTIME-INR
INR: 1.4 — ABNORMAL HIGH (ref 0.8–1.2)
Prothrombin Time: 16.2 seconds — ABNORMAL HIGH (ref 11.4–15.2)

## 2020-04-01 LAB — MAGNESIUM: Magnesium: 2.5 mg/dL — ABNORMAL HIGH (ref 1.7–2.4)

## 2020-04-01 MED ORDER — WARFARIN SODIUM 6 MG PO TABS
6.0000 mg | ORAL_TABLET | Freq: Once | ORAL | Status: AC
  Administered 2020-04-01: 6 mg via ORAL
  Filled 2020-04-01: qty 1

## 2020-04-01 MED ORDER — OXYMETAZOLINE HCL 0.05 % NA SOLN
1.0000 | Freq: Two times a day (BID) | NASAL | Status: DC
  Administered 2020-04-01 – 2020-04-04 (×7): 1 via NASAL
  Filled 2020-04-01: qty 30

## 2020-04-01 NOTE — Progress Notes (Signed)
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  He was down to about 3 L of oxygen.  He was discharged with home oxygen.  Apparently became more short of breath at home.  Called EMS.  Oxygen requirements were higher.  He was subsequently hospitalized for further management.  Reason for Visit: Pneumonia due to COVID-19.  Acute respiratory failure with hypoxia  Consultants: Cardiology  Procedures: None  Antibiotics: Anti-infectives (From admission, onward)   None      Subjective/Interval History:  Patient in bed, appears comfortable, denies any headache, no fever, no chest pain or pressure, much improved shortness of breath , no abdominal pain. No focal weakness.    Assessment/Plan:  Acute Hypoxic Resp. Failure/Pneumonia due to COVID-19 -unvaccinated and unfortunately has incurred severe parenchymal lung injury due to COVID-19 pneumonia, being treated with IV steroids, Remdesivir and Baricitinib.  Clinical situation although tenuous but seems to have somewhat stabilized.  Encouraged to sit up in chair use I-S and flutter valve for pulmonary toiletry.  Continue to monitor closely.  SpO2: 90 % O2 Flow Rate (L/min): 1 L/min FiO2 (%): 24 %   Recent Labs  Lab 03/26/20 0444 03/26/20 1935 03/27/20 0328 03/27/20 0328 03/28/20 0524 03/28/20 0634 03/29/20 0204 03/30/20 0457 03/31/20 0411 04/01/20 0500  WBC 17.7*  --  17.0*  --  17.1*  --   --   --  20.2* 20.4*  CRP 4.0*  --  <0.5  --  0.6  --   --   --   --  0.5  DDIMER >20.00*  --  >20.00*   < >  --  >20.00* >20.00* >20.00* 17.55* 9.80*  BNP  --   --   --   --   --   --   --   --   --  76.8  AST 24  --  36   < > 45*  --  42* 37 38  39  ALT 81*  --  74*   < > 89*  --  93* 86* 89* 99*  ALKPHOS 54  --  49   < > 54  --  55 52 51 62  BILITOT 1.2  --  1.2   < > 1.5*  --  1.6* 1.6* 1.5* 1.4*  ALBUMIN 2.6*  --  2.5*   < > 2.6*  --  2.8* 2.7* 2.6* 2.6*  INR  --    < > 3.1*   < >  --  2.3* 1.9* 1.6* 1.4* 1.4*   < > = values in this interval not displayed.         Elevated D-dimer due to intense inflammation.  CTA and leg ultrasound unremarkable currently on Lovenox, long-term on Coumadin at home.  D-dimer is now trending down.   Atrial fibrillation with RVR/concern of atrial flutter/history of MAT/PSVT -Mali vas 2 score of greater than 2, also has history of previous DVTs.. Patient does have a history of MAT and SVT but no previously documented history of atrial fibrillation. Stabile TSH and echocardiogram, cardiology on board.  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 1 week of 400 twice daily (04/06/20), 2 weeks of 200 twice daily (04/27/20), then  200 mg thereafter.  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  CT angiogram was negative.  Lower extremity Dopplers negative for DVT.  Currently on Lovenox.  Chronically on Coumadin.  Mild Nose bleed - due to dryness from oxygen, improved once he is wearing a facial mask, saline nasal drops and Afrin spray provided.  Again bleeding is very minimal.    Transaminitis Most likely due to COVID-19.  Stable.  Continue to monitor periodically.  Essential hypertension Blood pressure is reasonably well controlled.  Continue to monitor.  Lower extremity edema Echocardiogram showed normal systolic function without any significant valvular abnormalities.  Lower extremity Doppler study negative for DVT.  Keep legs elevated as much as possible.  Improvement noted with Lasix.  History of bronchial asthma Stable.  No wheezing appreciated at this time.  Chronic steroid use for longstanding  history of sciatica Apparently cannot do without prednisone as per PCP notes.  Holding his prednisone while he is getting Solu-Medrol.  Recent acute metabolic encephalopathy Apparently was confused during his previous hospitalization.  Seems to be at baseline currently.  Situational anxiety Apparently patient sister is quite sick with terminal cancer and is under hospice care at home.  She might pass away soon.  Patient with occasional crying episodes.  Pamelor dose was increased.  Continue alprazolam as needed.  Hopefully tapering of steroids will also help.  Morbid obesity Estimated body mass index is 42.68 kg/m as calculated from the following:   Height as of this encounter: 5\' 11"  (1.803 m).   Weight as of this encounter: 138.8 kg.   DVT Prophylaxis: Therapeutic Lovenox Code Status: Limited resuscitation: No intubation but all other interventions are fine.   Family Communication: Wife being updated daily.   Disposition Plan: Hopefully return home when improved.  Noted to be very deconditioned with increase in heart rate noted yesterday with minimal exertion.  Needs to be mobilized more before he can be safely discharged.    Status is: Inpatient  Remains inpatient appropriate because:IV treatments appropriate due to intensity of illness or inability to take PO and Inpatient level of care appropriate due to severity of illness   Dispo:  Patient From: Home  Planned Disposition: Home  Expected discharge date: 04/01/20  Medically stable for discharge: No        Medications:  Scheduled: . amiodarone  400 mg Oral BID  . enoxaparin (LOVENOX) injection  140 mg Subcutaneous Q12H  . famotidine  20 mg Oral Daily  . guaiFENesin  600 mg Oral BID  . mouth rinse  15 mL Mouth Rinse BID  . methylPREDNISolone (SOLU-MEDROL) injection  40 mg Intravenous Q12H  . nortriptyline  25 mg Oral QPM  . oxymetazoline  1 spray Each Nare BID  . sodium chloride flush  3 mL Intravenous Q12H  .  warfarin  6 mg Oral ONCE-1600  . Warfarin - Pharmacist Dosing Inpatient   Does not apply q1600   Continuous:  QQI:WLNLGXQJJHERD, ALPRAZolam, benzonatate, guaiFENesin-dextromethorphan, sodium chloride   Objective:  Vital Signs  Vitals:   03/31/20 1945 04/01/20 0026 04/01/20 0441 04/01/20 0800  BP: 130/86 (!) 145/94 (!) 129/97 123/83  Pulse: 80 70 74 65  Resp: 19 16 17 17   Temp: 98 F (36.7 C) 97.6 F (36.4 C) (!) 96.6 F (35.9 C) 97.7 F (36.5 C)  TempSrc: Axillary Axillary Axillary Oral  SpO2: 93% 92% 94% 90%  Weight:      Height:        Intake/Output  Summary (Last 24 hours) at 04/01/2020 1045 Last data filed at 04/01/2020 0912 Gross per 24 hour  Intake 720 ml  Output 725 ml  Net -5 ml   Filed Weights   03/29/20 0407  Weight: (!) 138.8 kg    Exam  Awake Alert, No new F.N deficits, Normal affect [place].AT,PERRAL Supple Neck,No JVD, No cervical lymphadenopathy appriciated.  Symmetrical Chest wall movement, Good air movement bilaterally, CTAB RRR,No Gallops, Rubs or new Murmurs, No Parasternal Heave +ve B.Sounds, Abd Soft, No tenderness, No organomegaly appriciated, No rebound - guarding or rigidity. No Cyanosis, Clubbing or edema, No new Rash or bruise   Lab Results:  Data Reviewed: I have personally reviewed following labs and imaging studies  CBC: Recent Labs  Lab 03/26/20 0444 03/27/20 0328 03/28/20 0524 03/31/20 0411 04/01/20 0500  WBC 17.7* 17.0* 17.1* 20.2* 20.4*  NEUTROABS 16.1* 15.3* 15.2*  --  18.8*  HGB 15.7 15.2 15.6 15.1 15.5  HCT 45.9 44.7 44.6 43.3 44.5  MCV 94.1 95.3 94.1 94.3 94.7  PLT 268 225 210 246 947    Basic Metabolic Panel: Recent Labs  Lab 03/26/20 0444 03/26/20 0444 03/27/20 0328 03/27/20 0328 03/28/20 0524 03/29/20 0204 03/30/20 0457 03/31/20 0411 04/01/20 0500  NA 137   < > 136   < > 135 136 132* 134* 136  K 4.0   < > 4.1   < > 4.0 4.2 4.3 4.5 4.6  CL 99   < > 98   < > 95* 94* 93* 96* 96*  CO2 26   < > 23   < >  28 30 29 28 27   GLUCOSE 195*   < > 168*   < > 171* 167* 159* 162* 144*  BUN 33*   < > 36*   < > 35* 34* 29* 30* 27*  CREATININE 1.20   < > 1.06   < > 1.04 1.22 1.02 1.07 0.98  CALCIUM 8.6*   < > 8.3*   < > 8.5* 8.5* 8.4* 8.3* 8.5*  MG 2.3  --  2.3  --  2.4  --   --  2.5* 2.5*   < > = values in this interval not displayed.    GFR: Estimated Creatinine Clearance: 102.8 mL/min (by C-G formula based on SCr of 0.98 mg/dL).  Liver Function Tests: Recent Labs  Lab 03/28/20 0524 03/29/20 0204 03/30/20 0457 03/31/20 0411 04/01/20 0500  AST 45* 42* 37 38 39  ALT 89* 93* 86* 89* 99*  ALKPHOS 54 55 52 51 62  BILITOT 1.5* 1.6* 1.6* 1.5* 1.4*  PROT 6.5 6.5 6.1* 6.3* 6.2*  ALBUMIN 2.6* 2.8* 2.7* 2.6* 2.6*    Coagulation Profile: Recent Labs  Lab 03/28/20 0634 03/29/20 0204 03/30/20 0457 03/31/20 0411 04/01/20 0500  INR 2.3* 1.9* 1.6* 1.4* 1.4*     Recent Results (from the past 240 hour(s))  Blood Culture (routine x 2)     Status: None   Collection Time: 03/23/20  7:40 AM   Specimen: BLOOD  Result Value Ref Range Status   Specimen Description BLOOD RIGHT ANTECUBITAL  Final   Special Requests   Final    BOTTLES DRAWN AEROBIC AND ANAEROBIC Blood Culture results may not be optimal due to an inadequate volume of blood received in culture bottles   Culture   Final    NO GROWTH 5 DAYS Performed at [place] [address], [place], [place] [address]    Report Status 03/28/2020 FINAL  Final  Blood Culture (routine x 2)     Status: None   Collection Time: 03/23/20  8:00 AM   Specimen: BLOOD  Result Value Ref Range Status   Specimen Description BLOOD LEFT ANTECUBITAL  Final   Special Requests   Final    BOTTLES DRAWN AEROBIC AND ANAEROBIC Blood Culture adequate volume   Culture   Final    NO GROWTH 5 DAYS Performed at [facility], [address] [place], [place] [address]    Report Status 03/28/2020 FINAL  Final  MRSA PCR Screening     Status: None    Collection Time: 03/24/20  6:54 PM   Specimen: Nasal Mucosa; Nasopharyngeal  Result Value Ref Range Status   MRSA by PCR NEGATIVE NEGATIVE Final    Comment:        The GeneXpert MRSA Assay (FDA approved for NASAL specimens only), is one component of a comprehensive MRSA colonization surveillance program. It is not intended to diagnose MRSA infection nor to guide or monitor treatment for MRSA infections. Performed at [facility], [place] [address], [place], [place] [address]       Radiology Studies: No results found.     LOS: 9 days   [facility] Pager on [email or web address]  04/01/2020, 10:45 AM

## 2020-04-01 NOTE — Progress Notes (Signed)
ANTICOAGULATION CONSULT NOTE - Follow Up Consult  Pharmacy Consult for lovenox>warfarin Indication: history of PE  Patient Measurements: Height: 5\' 11"  (180.3 cm) Weight: (!) 138.8 kg (306 lb) IBW/kg (Calculated) : 75.3  Vital Signs: Temp: 96.6 F (35.9 C) (10/10 0441) Temp Source: Axillary (10/10 0441) BP: 129/97 (10/10 0441) Pulse Rate: 74 (10/10 0441)  Labs: Recent Labs    03/30/20 0457 03/31/20 0411 04/01/20 0500  HGB  --  15.1 15.5  HCT  --  43.3 44.5  PLT  --  246 266  LABPROT 18.7* 16.4* 16.2*  INR 1.6* 1.4* 1.4*  CREATININE 1.02 1.07 0.98    Estimated Creatinine Clearance: 102.8 mL/min (by C-G formula based on SCr of 0.98 mg/dL).  Assessment: 69 year old male admitted for COVID19 pneumonia. PTA on warfarin 6mg  all days except 3mg  on Monday and Wednesday. Currently on lovenox 140mg  subcu q12h and transitioning to warfarin.   CBC stable/uptrending: Hgb 15.5; Plt 266 INR 1.4 today (subtherapeutic)  Goal of Therapy:  INR 2-3 Monitor platelets by anticoagulation protocol: Yes   Plan:  Continue lovenox 140mg  subcu q12h  Warfarin 6mg  by mouth x1 today Monitor CBC, renal func, and daily INR  Wilson Singer, PharmD PGY1 Pharmacy Resident 04/01/2020 7:26 AM

## 2020-04-01 NOTE — Progress Notes (Signed)
Progress Note  Patient Name: Robert [name] Date of Encounter: 04/01/2020  [facility] Cardiologist: Buford Dresser, MD   Subjective   Robert [name] is a 69 y.o. male with a hx of prior DVT/PE, dyslipidemia, obesity, PSVT,  HTN, Mild AS, who was evaluated 03/29/20 in the setting of new A fib with COVID Pneumonia who is being seen today for the evaluation of atrial fib at the request of Dr. Kevan Rosebush.  Heart rates have improved.  Patient notes improvement in his heart rate and that he can feel it.  Is coughing up more phlegm.  Inpatient Medications    Scheduled Meds: . amiodarone  400 mg Oral BID  . enoxaparin (LOVENOX) injection  140 mg Subcutaneous Q12H  . famotidine  20 mg Oral Daily  . guaiFENesin  600 mg Oral BID  . mouth rinse  15 mL Mouth Rinse BID  . methylPREDNISolone (SOLU-MEDROL) injection  40 mg Intravenous Q12H  . nortriptyline  25 mg Oral QPM  . oxymetazoline  1 spray Each Nare BID  . sodium chloride flush  3 mL Intravenous Q12H  . warfarin  6 mg Oral ONCE-1600  . Warfarin - Pharmacist Dosing Inpatient   Does not apply q1600   Continuous Infusions:  PRN Meds: acetaminophen, ALPRAZolam, benzonatate, guaiFENesin-dextromethorphan, sodium chloride   Vital Signs    Vitals:   03/31/20 1945 04/01/20 0026 04/01/20 0441 04/01/20 0800  BP: 130/86 (!) 145/94 (!) 129/97 123/83  Pulse: 80 70 74 65  Resp: 19 16 17 17   Temp: 98 F (36.7 C) 97.6 F (36.4 C) (!) 96.6 F (35.9 C) 97.7 F (36.5 C)  TempSrc: Axillary Axillary Axillary Oral  SpO2: 93% 92% 94% 90%  Weight:      Height:        Intake/Output Summary (Last 24 hours) at 04/01/2020 1140 Last data filed at 04/01/2020 0912 Gross per 24 hour  Intake 720 ml  Output 725 ml  Net -5 ml   Last 3 Weights 03/29/2020 03/18/2020 03/17/2020  Weight (lbs) 306 lb 324 lb 11.8 oz 318 lb  Weight (kg) 138.801 kg 147.3 kg 144.244 kg      Telemetry    A fib average rate 89- Personally Reviewed  ECG     No new- Personally Reviewed  Physical Exam   General appearance: Awake alert.  In no distress Resp: Coarse breath sounds bilaterally with no wheezesCardio: irregularly irregular but rate controlled  No rubs or bruit. GI: Abdomen is soft.  Nontender nondistended. Extremities: +1 edema bilateral lower extremities Neurologic: Alert and oriented x3.  No focal neurological deficits.   Labs    High Sensitivity Troponin:   Recent Labs  Lab 03/23/20 0703 03/23/20 0949  TROPONINIHS 41* 41*      Chemistry Recent Labs  Lab 03/26/20 0444 03/26/20 0444 03/27/20 0328 03/28/20 0524 03/30/20 0457 03/31/20 0411 04/01/20 0500  NA 137   < > 136   < > 132* 134* 136  K 4.0   < > 4.1   < > 4.3 4.5 4.6  CL 99   < > 98   < > 93* 96* 96*  CO2 26   < > 23   < > 29 28 27   GLUCOSE 195*   < > 168*   < > 159* 162* 144*  BUN 33*   < > 36*   < > 29* 30* 27*  CREATININE 1.20   < > 1.06   < > 1.02 1.07 0.98  CALCIUM  8.6*   < > 8.3*   < > 8.4* 8.3* 8.5*  PROT 6.6   < > 6.3*   < > 6.1* 6.3* 6.2*  ALBUMIN 2.6*   < > 2.5*   < > 2.7* 2.6* 2.6*  AST 24   < > 36   < > 37 38 39  ALT 81*   < > 74*   < > 86* 89* 99*  ALKPHOS 54   < > 49   < > 52 51 62  BILITOT 1.2   < > 1.2   < > 1.6* 1.5* 1.4*  GFRNONAA >60   < > >60   < > >60 >60 >60  GFRAA >60  --  >60  --   --   --   --   ANIONGAP 12   < > 15   < > 10 10 13    < > = values in this interval not displayed.     Hematology Recent Labs  Lab 03/28/20 0524 03/31/20 0411 04/01/20 0500  WBC 17.1* 20.2* 20.4*  RBC 4.74 4.59 4.70  HGB 15.6 15.1 15.5  HCT 44.6 43.3 44.5  MCV 94.1 94.3 94.7  MCH 32.9 32.9 33.0  MCHC 35.0 34.9 34.8  RDW 12.4 12.2 12.2  PLT 210 246 266    DDimer  Recent Labs  Lab 03/30/20 0457 03/31/20 0411 04/01/20 0500  DDIMER >20.00* 17.55* 9.80*    Cardiac Studies   Echo with normal EF on 2020/04/14 mildly dilated left atrium, mild AS  Patient Profile     69 y.o. male with Covid pneumonia, elevated D-dimer, atrial  fibrillation  Assessment & Plan    Persistent atrial fibrillation/SVT -New diagnosis -Amiodarone load 400 mg twice daily started on 03/29/2020.  Would recommend 1 week of 400 twice daily (04/06/20), 2 weeks of 200 twice daily (04/27/20), then 200 mg thereafter -Currently on Lovenox and warfarin; if pt comes of amiodarone warfarin dose will need to be adjusted -Has not been able to tolerate metoprolol because of asthma in the past -Diltiazem causes anaphylaxis.  Covid PNA Elevated troponin -Secondary to demand ischemia in the setting of Covid pneumonia. Prior DVT PE -AC as above Morbid obesity -Have discussed exercise post infection.  BMI of 42.  For questions or updates, please contact [place] Please consult [email or web address] for contact info under        Signed, Werner Lean, MD  04/01/2020, 11:40 AM

## 2020-04-02 ENCOUNTER — Ambulatory Visit: Payer: MEDICARE | Admitting: Diagnostic Neuroimaging

## 2020-04-02 DIAGNOSIS — U071 COVID-19: Secondary | ICD-10-CM | POA: Diagnosis not present

## 2020-04-02 DIAGNOSIS — J9601 Acute respiratory failure with hypoxia: Secondary | ICD-10-CM | POA: Diagnosis not present

## 2020-04-02 DIAGNOSIS — I4819 Other persistent atrial fibrillation: Secondary | ICD-10-CM | POA: Diagnosis not present

## 2020-04-02 LAB — COMPREHENSIVE METABOLIC PANEL
ALT: 86 U/L — ABNORMAL HIGH (ref 0–44)
AST: 29 U/L (ref 15–41)
Albumin: 2.4 g/dL — ABNORMAL LOW (ref 3.5–5.0)
Alkaline Phosphatase: 66 U/L (ref 38–126)
Anion gap: 11 (ref 5–15)
BUN: 25 mg/dL — ABNORMAL HIGH (ref 8–23)
CO2: 27 mmol/L (ref 22–32)
Calcium: 8.3 mg/dL — ABNORMAL LOW (ref 8.9–10.3)
Chloride: 98 mmol/L (ref 98–111)
Creatinine, Ser: 0.92 mg/dL (ref 0.61–1.24)
GFR, Estimated: >60 mL/min (ref >60)
Glucose, Bld: 171 mg/dL — ABNORMAL HIGH (ref 70–99)
Potassium: 4.8 mmol/L (ref 3.5–5.1)
Sodium: 136 mmol/L (ref 135–145)
Total Bilirubin: 1.2 mg/dL (ref 0.3–1.2)
Total Protein: 6 g/dL — ABNORMAL LOW (ref 6.5–8.1)

## 2020-04-02 LAB — CBC WITH DIFFERENTIAL/PLATELET
Abs Immature Granulocytes: 0.34 10*3/uL — ABNORMAL HIGH (ref 0.00–0.07)
Basophils Absolute: 0 10*3/uL (ref 0.0–0.1)
Basophils Relative: 0 %
Eosinophils Absolute: 0 10*3/uL (ref 0.0–0.5)
Eosinophils Relative: 0 %
HCT: 43.5 % (ref 39.0–52.0)
Hemoglobin: 14.7 g/dL (ref 13.0–17.0)
Immature Granulocytes: 2 %
Lymphocytes Relative: 2 %
Lymphs Abs: 0.4 10*3/uL — ABNORMAL LOW (ref 0.7–4.0)
MCH: 31.9 pg (ref 26.0–34.0)
MCHC: 33.8 g/dL (ref 30.0–36.0)
MCV: 94.4 fL (ref 80.0–100.0)
Monocytes Absolute: 0.8 10*3/uL (ref 0.1–1.0)
Monocytes Relative: 4 %
Neutro Abs: 18.9 10*3/uL — ABNORMAL HIGH (ref 1.7–7.7)
Neutrophils Relative %: 92 %
Platelets: 259 10*3/uL (ref 150–400)
RBC: 4.61 MIL/uL (ref 4.22–5.81)
RDW: 12.3 % (ref 11.5–15.5)
WBC: 20.5 10*3/uL — ABNORMAL HIGH (ref 4.0–10.5)
nRBC: 0 % (ref 0.0–0.2)

## 2020-04-02 LAB — C-REACTIVE PROTEIN: CRP: <0.5 mg/dL (ref <1.0)

## 2020-04-02 LAB — BRAIN NATRIURETIC PEPTIDE: B Natriuretic Peptide: 91.4 pg/mL (ref 0.0–100.0)

## 2020-04-02 LAB — MAGNESIUM: Magnesium: 2.4 mg/dL (ref 1.7–2.4)

## 2020-04-02 LAB — PROTIME-INR
INR: 1.4 — ABNORMAL HIGH (ref 0.8–1.2)
Prothrombin Time: 16.9 seconds — ABNORMAL HIGH (ref 11.4–15.2)

## 2020-04-02 LAB — PROCALCITONIN: Procalcitonin: <0.1 ng/mL

## 2020-04-02 MED ORDER — DIGOXIN 0.25 MG/ML IJ SOLN
0.1250 mg | Freq: Four times a day (QID) | INTRAMUSCULAR | Status: DC
  Administered 2020-04-02: 0.125 mg via INTRAVENOUS
  Filled 2020-04-02: qty 2

## 2020-04-02 MED ORDER — METHYLPREDNISOLONE SODIUM SUCC 40 MG IJ SOLR
40.0000 mg | INTRAMUSCULAR | Status: DC
  Administered 2020-04-03: 40 mg via INTRAVENOUS
  Filled 2020-04-02: qty 1

## 2020-04-02 MED ORDER — WARFARIN SODIUM 7.5 MG PO TABS
7.5000 mg | ORAL_TABLET | Freq: Once | ORAL | Status: AC
  Administered 2020-04-02: 7.5 mg via ORAL
  Filled 2020-04-02: qty 1

## 2020-04-02 NOTE — TOC Benefit Eligibility Note (Signed)
Transition of Care [facility]) Benefit Eligibility Note    Patient Details  Name: Robert [name] MRN: [id number] Date of Birth: 01-27-1951   Medication/Dose: Enoxaparin 150mg  bid (does not come in 140mg )  Covered?: Yes     Prescription Coverage Preferred Pharmacy: [facility]  Spoke with Person/Company/Phone Number:: [employer]  Co-Pay: $92.69 for 90 day mail order/ $32.03 for 30 day retail  Prior Approval: No     Additional Notes: Name brand Lovenox not covered    Delorse Lek Phone Number: 04/02/2020, 12:56 PM

## 2020-04-02 NOTE — Care Management (Signed)
Sent request for Benefits check on Lovenox 140 mg BID

## 2020-04-02 NOTE — Progress Notes (Signed)
Occupational Therapy Treatment Patient Details Name: Robert [name] MRN: [id number] DOB: [birth year] Today's Date: 04/02/2020    History of present illness 69 yo male presenting via EMS with increased shortness of breath. Recent admission (9/26) with COVID-19 pneumonia and discharged (9/30) to home on 3L O2. 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   OT comments  Pt progressing towards OT goals this session. Pt able to wash himself seated in recliner with mod A for below the knees and assist for rear peri care (including application of barrier cream). Pt DOE 2/4 and dropping to 85% on RA with ability to bring back >90% with PLB technique. Pt will benefit from education and practice with AE (specifically long handle sponge, toilet aide) next session.   Follow Up Recommendations  Home health OT    Equipment Recommendations  3 in 1 bedside commode (wide)   Recommendations for Other Services      Precautions / Restrictions Precautions Precautions: Other (comment) Precaution Comments: Watch SpO2 and HR Restrictions Weight Bearing Restrictions: No       Mobility Bed Mobility               General bed mobility comments: Received sitting in recliner  Transfers Overall transfer level: Needs assistance Equipment used: None Transfers: Sit to/from Stand;Stand Pivot Transfers Sit to Stand: Supervision Stand pivot transfers: Supervision       General transfer comment: Supervision for safety, increased time    Balance Overall balance assessment: Needs assistance Sitting-balance support: Feet supported;No upper extremity supported Sitting balance-Leahy Scale: Good     Standing balance support: Single extremity supported;During functional activity;No upper extremity supported Standing balance-Leahy Scale: Fair                             ADL either performed or assessed with clinical judgement   ADL Overall ADL's : Needs  assistance/impaired     Grooming: Set up;Oral care;Wash/dry face;Brushing hair;Sitting   Upper Body Bathing: Minimal assistance;Sitting Upper Body Bathing Details (indicate cue type and reason): assist for back, educated on long handle sponge Lower Body Bathing: Moderate assistance;Total assistance Lower Body Bathing Details (indicate cue type and reason): assist for the knees down     Lower Body Dressing: Moderate assistance;Sit to/from stand Lower Body Dressing Details (indicate cue type and reason): to doff/don socks Toilet Transfer: Supervision/safety;RW   Toileting- Clothing Manipulation and Hygiene: Moderate assistance;Sit to/from stand Toileting - Clothing Manipulation Details (indicate cue type and reason): assist to clean and apply barrier cream     Functional mobility during ADLs: Supervision/safety;Rolling walker       Vision       Perception     Praxis      Cognition Arousal/Alertness: Awake/alert Behavior During Therapy: WFL for tasks assessed/performed Overall Cognitive Status: Within Functional Limits for tasks assessed                                          Exercises General Exercises - Lower Extremity Ankle Circles/Pumps: AROM;Both;10 reps;Seated Long Arc Quad: AROM;Right;Left;10 reps;Seated Hip ABduction/ADduction: AROM;Both;10 reps;Seated Hip Flexion/Marching: AROM;Right;Left;10 reps;Seated   Shoulder Instructions       General Comments reviewed energy conservation with Pt and application to ADL    Pertinent Vitals/ Pain       Pain Assessment: No/denies pain Faces Pain Scale: Hurts  a little bit Pain Location: generalized Pain Descriptors / Indicators: Discomfort;Grimacing Pain Intervention(s): Monitored during session  Home Living                                          Prior Functioning/Environment              Frequency  Min 2X/week        Progress Toward Goals  OT Goals(current goals  can now be found in the care plan section)  Progress towards OT goals: Progressing toward goals  Acute Rehab OT Goals Patient Stated Goal: home OT Goal Formulation: With patient Time For Goal Achievement: 04/10/20 Potential to Achieve Goals: Good  Plan Discharge plan remains appropriate;Equipment recommendations need to be updated    Co-evaluation                 AM-PAC OT "6 Clicks" Daily Activity     Outcome Measure   Help from another person eating meals?: A Little Help from another person taking care of personal grooming?: A Little Help from another person toileting, which includes using toliet, bedpan, or urinal?: A Little Help from another person bathing (including washing, rinsing, drying)?: A Little Help from another person to put on and taking off regular upper body clothing?: A Little Help from another person to put on and taking off regular lower body clothing?: A Little 6 Click Score: 18    End of Session    OT Visit Diagnosis: Other (comment) (decreased pulmonary tolerance)   Activity Tolerance Patient tolerated treatment well   Patient Left in chair;with call bell/phone within reach   Nurse Communication Mobility status        Time: 1021-1173 OT Time Calculation (min): 25 min  Charges: OT General Charges $OT Visit: 1 Visit OT Treatments $Self Care/Home Management : 8-22 mins $Therapeutic Activity: 8-22 mins  Jesse Sans OTR/L Acute Rehabilitation Services Pager: [phone] Office: [place] 04/02/2020, 1:19 PM

## 2020-04-02 NOTE — Progress Notes (Signed)
Physical Therapy Treatment Patient Details Name: Robert [name] MRN: [id number] DOB: [birth year] Today's Date: 04/02/2020    History of Present Illness 69 yo male presenting via EMS with increased shortness of breath. Recent admission (9/26) with COVID-19 pneumonia and discharged (9/30) to home on 3L O2. 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    PT Comments    Pt in recliner on RA. Spo2 90%. Resting HR in 90s. Ambulated 15' x 2 HH/min assist on RA. Desat to 85%. Seated rest break between trials to recover to 90%. Max HR 142 1st trial and 138 2nd trial. Pt performed LE exercises seated in recliner.    Follow Up Recommendations  No PT follow up     Equipment Recommendations  Other (comment) (bari rollator)    Recommendations for Other Services       Precautions / Restrictions Precautions Precautions: Other (comment) Precaution Comments: Watch SpO2 and HR Restrictions Weight Bearing Restrictions: No    Mobility  Bed Mobility               General bed mobility comments: Received sitting in recliner  Transfers Overall transfer level: Needs assistance Equipment used: None Transfers: Sit to/from Stand;Stand Pivot Transfers Sit to Stand: Supervision Stand pivot transfers: Supervision       General transfer comment: Supervision for safety, increased time  Ambulation/Gait Ambulation/Gait assistance: Min assist Gait Distance (Feet): 15 Feet (x 2) Assistive device: 1 person hand held assist Gait Pattern/deviations: Step-through pattern;Decreased stride length Gait velocity: Decreased Gait velocity interpretation: <1.8 ft/sec, indicate of risk for recurrent falls General Gait Details: Ambulated without AD as trial. HHA on L. Pt furniture walking, reaching for support on R. Mobilized on RA. Desat to 85%. Seated rest break between trials to recover to 90%.   Stairs             Wheelchair Mobility    Modified Rankin  (Stroke Patients Only)       Balance Overall balance assessment: Needs assistance Sitting-balance support: Feet supported;No upper extremity supported Sitting balance-Leahy Scale: Good     Standing balance support: Single extremity supported;During functional activity;No upper extremity supported Standing balance-Leahy Scale: Fair                              Cognition Arousal/Alertness: Awake/alert Behavior During Therapy: WFL for tasks assessed/performed Overall Cognitive Status: Within Functional Limits for tasks assessed                                        Exercises General Exercises - Lower Extremity Ankle Circles/Pumps: AROM;Both;10 reps;Seated Long Arc Quad: AROM;Right;Left;10 reps;Seated Hip ABduction/ADduction: AROM;Both;10 reps;Seated Hip Flexion/Marching: AROM;Right;Left;10 reps;Seated    General Comments General comments (skin integrity, edema, etc.): SpO2 90% at rest on RA, HR 102. Desat to 85% during each gait trial. Max HR 142 1st trial and 138 second trial. 3/4 DOE.      Pertinent Vitals/Pain Pain Assessment: Faces Faces Pain Scale: Hurts a little bit Pain Location: generalized Pain Descriptors / Indicators: Discomfort;Grimacing Pain Intervention(s): Limited activity within patient's tolerance;Monitored during session;Repositioned    Home Living                      Prior Function            PT Goals (current goals can  now be found in the care plan section) Acute Rehab PT Goals Patient Stated Goal: home Progress towards PT goals: Progressing toward goals    Frequency    Min 3X/week      PT Plan Current plan remains appropriate    Co-evaluation              AM-PAC PT "6 Clicks" Mobility   Outcome Measure  Help needed turning from your back to your side while in a flat bed without using bedrails?: None Help needed moving from lying on your back to sitting on the side of a flat bed without  using bedrails?: None Help needed moving to and from a bed to a chair (including a wheelchair)?: None Help needed standing up from a chair using your arms (e.g., wheelchair or bedside chair)?: None Help needed to walk in hospital room?: A Little Help needed climbing 3-5 steps with a railing? : A Little 6 Click Score: 22    End of Session   Activity Tolerance: Treatment limited secondary to medical complications (Comment) (tachycardia) Patient left: in chair;with call bell/phone within reach Nurse Communication: Mobility status PT Visit Diagnosis: Other abnormalities of gait and mobility (R26.89)     Time: 4332-9518 PT Time Calculation (min) (ACUTE ONLY): 25 min  Charges:  $Gait Training: 8-22 mins $Therapeutic Exercise: 8-22 mins                     Lorrin Goodell, PT  Office # [phone] Pager [phone]    [name] 04/02/2020, 10:36 AM

## 2020-04-02 NOTE — Progress Notes (Signed)
Progress Note  Patient Name: Robert [name] Date of Encounter: 04/02/2020  Primary Cardiologist: Robert Dresser, MD   Subjective   Robert Petrenko Sizemoreis a 69 y.o.malewith a hx of prior DVT/PE, dyslipidemia, obesity, PSVT, HTN, Mild AS, who was evaluated 03/29/20 in the setting of new A fib with COVID Pneumoniawho is being seen today for the evaluation of atrial fibat the request of Dr. Kevan Rosebush.  No issues overnight.  Feeling better of breathing treatment.  Inpatient Medications    Scheduled Meds: . amiodarone  400 mg Oral BID  . enoxaparin (LOVENOX) injection  140 mg Subcutaneous Q12H  . famotidine  20 mg Oral Daily  . guaiFENesin  600 mg Oral BID  . mouth rinse  15 mL Mouth Rinse BID  . [START ON 04/03/2020] methylPREDNISolone (SOLU-MEDROL) injection  40 mg Intravenous Q24H  . nortriptyline  25 mg Oral QPM  . oxymetazoline  1 spray Each Nare BID  . sodium chloride flush  3 mL Intravenous Q12H  . warfarin  7.5 mg Oral ONCE-1600  . Warfarin - Pharmacist Dosing Inpatient   Does not apply q1600   Continuous Infusions:  PRN Meds: acetaminophen, ALPRAZolam, benzonatate, guaiFENesin-dextromethorphan, sodium chloride   Vital Signs    Vitals:   04/01/20 2036 04/02/20 0006 04/02/20 0449 04/02/20 0800  BP: 120/68 130/84 119/83 (!) 147/95  Pulse: 83 85 79 92  Resp: 17 16 19  (!) 22  Temp: 98.2 F (36.8 C) 98.4 F (36.9 C) 97.9 F (36.6 C) 98.2 F (36.8 C)  TempSrc: Oral Oral Oral Oral  SpO2: 91% 92% 90% 90%  Weight:      Height:        Intake/Output Summary (Last 24 hours) at 04/02/2020 1102 Last data filed at 04/02/2020 0903 Gross per 24 hour  Intake 120 ml  Output 1475 ml  Net -1355 ml   Filed Weights   03/29/20 0407  Weight: (!) 138.8 kg    Telemetry    Afib rate controlled - Personally Reviewed  ECG    10/11 a fib rate controlled  - Personally Reviewed  Physical Exam   GEN: No acute distress.   Neck: No JVD Cardiac: irregularly  irregular, no murmurs, rubs, or gallops.  Respiratory: Coarse bilaterally with no crackles GI: Soft, nontender, non-distended  MS: No edema; No deformity. Neuro:  Nonfocal  Psych: Normal affect   Labs    Chemistry Recent Labs  Lab 03/27/20 0328 03/28/20 0524 03/31/20 0411 04/01/20 0500 04/02/20 0354  NA 136   < > 134* 136 136  K 4.1   < > 4.5 4.6 4.8  CL 98   < > 96* 96* 98  CO2 23   < > 28 27 27   GLUCOSE 168*   < > 162* 144* 171*  BUN 36*   < > 30* 27* 25*  CREATININE 1.06   < > 1.07 0.98 0.92  CALCIUM 8.3*   < > 8.3* 8.5* 8.3*  PROT 6.3*   < > 6.3* 6.2* 6.0*  ALBUMIN 2.5*   < > 2.6* 2.6* 2.4*  AST 36   < > 38 39 29  ALT 74*   < > 89* 99* 86*  ALKPHOS 49   < > 51 62 66  BILITOT 1.2   < > 1.5* 1.4* 1.2  GFRNONAA >60   < > >60 >60 >60  GFRAA >60  --   --   --   --   ANIONGAP 15   < > 10 13  11   < > = values in this interval not displayed.     Hematology Recent Labs  Lab 03/31/20 0411 04/01/20 0500 04/02/20 0354  WBC 20.2* 20.4* 20.5*  RBC 4.59 4.70 4.61  HGB 15.1 15.5 14.7  HCT 43.3 44.5 43.5  MCV 94.3 94.7 94.4  MCH 32.9 33.0 31.9  MCHC 34.9 34.8 33.8  RDW 12.2 12.2 12.3  PLT 246 266 259    Cardiac EnzymesNo results for input(s): TROPONINI in the last 168 hours. No results for input(s): TROPIPOC in the last 168 hours.   BNP Recent Labs  Lab 04/01/20 0500 04/02/20 0354  BNP 76.8 91.4     DDimer  Recent Labs  Lab 03/30/20 0457 03/31/20 0411 04/01/20 0500  DDIMER >20.00* 17.55* 9.80*     Radiology    No results found.  Cardiac Studies   No new  Patient Profile     69 y.o. male AF and COVID  Assessment & Plan    Persistent atrial fibrillation/SVT -New diagnosis -Amiodarone load 400 mg twice daily started on 03/29/2020.  Would recommend 1 week of 400 twice daily (04/06/20), 2 weeks of 200 twice daily (04/27/20), then 200 mg thereafter -Currently on Lovenox and warfarin; if pt comes of amiodarone warfarin dose will need to be  adjusted -Has not been able to tolerate metoprolol because of asthma in the past -Diltiazem causes anaphylaxis.  Covid PNA Elevated troponin -Secondary to demand ischemia in the setting of Covid pneumonia. Prior DVT PE -AC as above Morbid obesity -Have discussed exercise post infection.  BMI of 42.   For questions or updates, please contact [place] Please consult [email or web address] for contact info under Cardiology/STEMI.      Signed, Werner Lean, MD  04/02/2020, 11:02 AM

## 2020-04-02 NOTE — Progress Notes (Signed)
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  He was down to about 3 L of oxygen.  He was discharged with home oxygen.  Apparently became more short of breath at home.  Called EMS.  Oxygen requirements were higher.  He was subsequently hospitalized for further management.  Reason for Visit: Pneumonia due to COVID-19.  Acute respiratory failure with hypoxia  Consultants: Cardiology  Procedures: None  Antibiotics: Anti-infectives (From admission, onward)   None      Subjective/Interval History:  Patient in bed, appears comfortable, denies any headache, no fever, no chest pain or pressure, no shortness of breath , no abdominal pain. No focal weakness.     Assessment/Plan:  Acute Hypoxic Resp. Failure/Pneumonia due to COVID-19 - unvaccinated and unfortunately has incurred severe parenchymal lung injury due to COVID-19 pneumonia, being treated with IV steroids, Remdesivir and Baricitinib.  He has finally shown some signs of clinical improvement, advance activity.  Encouraged to sit up in chair use I-S and flutter valve for pulmonary toiletry. Continue to monitor closely.  SpO2: 90 % O2 Flow Rate (L/min): 1 L/min FiO2 (%): 24 %   Recent Labs  Lab 03/27/20 0328 03/27/20 0328 03/28/20 0524 03/28/20 0524 03/28/20 0634 03/29/20 0204 03/30/20 0457 03/31/20 0411 04/01/20 0500 04/02/20 0354  WBC 17.0*  --  17.1*  --   --   --   --  20.2* 20.4* 20.5*  CRP <0.5  --  0.6  --   --   --   --   --  0.5 <0.5  DDIMER >20.00*   < >  --   --  >20.00* >20.00* >20.00* 17.55* 9.80*  --   BNP  --   --   --   --   --   --   --   --  76.8 91.4  AST 36   < > 45*   < >  --  42* 37 38 39 29  ALT  74*   < > 89*   < >  --  93* 86* 89* 99* 86*  ALKPHOS 49   < > 54   < >  --  55 52 51 62 66  BILITOT 1.2   < > 1.5*   < >  --  1.6* 1.6* 1.5* 1.4* 1.2  ALBUMIN 2.5*   < > 2.6*   < >  --  2.8* 2.7* 2.6* 2.6* 2.4*  INR 3.1*   < >  --    < > 2.3* 1.9* 1.6* 1.4* 1.4* 1.4*   < > = values in this interval not displayed.         Elevated D-dimer due to intense inflammation.  CTA and leg ultrasound unremarkable currently on Lovenox, long-term on Coumadin at home.  D-dimer is now trending down.   Atrial fibrillation with RVR/concern of atrial flutter/history of MAT/PSVT -Mali vas 2 score of greater than 2, also has history of previous DVTs.. Patient does have a history of MAT and SVT but no previously documented history of atrial fibrillation. Stabile TSH and echocardiogram, cardiology on board.  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 1 week of 400 twice daily (04/06/20), 2 weeks of 200  twice daily (04/27/20), then 200 mg thereafter.  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  CT angiogram was negative.  Lower extremity Dopplers negative for DVT.  Currently on Lovenox.  Chronically on Coumadin, does not want to try newer oral anticoagulation agents.  Mild Nose bleed - due to dryness from oxygen, improved once he is wearing a facial mask, saline nasal drops and Afrin spray provided.  Again bleeding is very minimal.    Transaminitis - Most likely due to COVID-19.  Stable.  Continue to monitor periodically.  Essential hypertension - Blood pressure is reasonably well controlled.  Continue to monitor.  Lower extremity edema - Echocardiogram showed normal systolic function without any significant valvular abnormalities.  Lower extremity Doppler study negative for DVT.  Keep legs elevated as much as possible.  Improvement noted with PRN Lasix.  History of bronchial asthma - Stable.  No wheezing appreciated  at this time.  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  Holding his prednisone while he is getting Solu-Medrol.  Recent acute metabolic encephalopathy - Apparently was confused during his previous hospitalization.  Seems to be at baseline currently.  Situational anxiety - Apparently patient sister is quite sick with terminal cancer and is under hospice care at home.  She might pass away soon.  Patient with occasional crying episodes.  Pamelor dose was increased.  Continue alprazolam as needed.  Hopefully tapering of steroids will also help.  Morbid obesity - BMI of 46, follow with PCP for weight loss.    DVT Prophylaxis: Therapeutic Lovenox/Coumadin Code Status: Limited resuscitation: No intubation but all other interventions are fine.   Family Communication: Wife being updated daily.   Disposition Plan: Hopefully return home when improved.  Noted to be very deconditioned with increase in heart rate noted yesterday with minimal exertion.  Needs to be mobilized more before he can be safely discharged.    Status is: Inpatient  Remains inpatient appropriate because:IV treatments appropriate due to intensity of illness or inability to take PO and Inpatient level of care appropriate due to severity of illness   Dispo:  Patient From: Home  Planned Disposition: Home  Expected discharge date: 04/03/20  Medically stable for discharge: No   Medications:  Scheduled: . amiodarone  400 mg Oral BID  . enoxaparin (LOVENOX) injection  140 mg Subcutaneous Q12H  . famotidine  20 mg Oral Daily  . guaiFENesin  600 mg Oral BID  . mouth rinse  15 mL Mouth Rinse BID  . [START ON 04/03/2020] methylPREDNISolone (SOLU-MEDROL) injection  40 mg Intravenous Q24H  . nortriptyline  25 mg Oral QPM  . oxymetazoline  1 spray Each Nare BID  . sodium chloride flush  3 mL Intravenous Q12H  . warfarin  7.5 mg Oral ONCE-1600  . Warfarin -  Pharmacist Dosing Inpatient   Does not apply q1600   Continuous:  OXB:[id number], ALPRAZolam, benzonatate, guaiFENesin-dextromethorphan, sodium chloride   Objective:  Vital Signs  Vitals:   04/01/20 2036 04/02/20 0006 04/02/20 0449 04/02/20 0800  BP: 120/68 130/84 119/83 (!) 147/95  Pulse: 83 85 79 92  Resp: 17 16 19  (!) 22  Temp: 98.2 F (36.8 C) 98.4 F (36.9 C) 97.9 F (36.6 C) 98.2 F (36.8 C)  TempSrc: Oral Oral Oral Oral  SpO2: 91% 92% 90% 90%  Weight:      Height:        Intake/Output Summary (Last 24  hours) at 04/02/2020 1052 Last data filed at 04/02/2020 0903 Gross per 24 hour  Intake 120 ml  Output 1475 ml  Net -1355 ml   Filed Weights   03/29/20 0407  Weight: (!) 138.8 kg    Exam  Awake Alert, No new F.N deficits, Normal affect [place].AT,PERRAL Supple Neck,No JVD, No cervical lymphadenopathy appriciated.  Symmetrical Chest wall movement, Good air movement bilaterally, CTAB RRR,No Gallops, Rubs or new Murmurs, No Parasternal Heave +ve B.Sounds, Abd Soft, No tenderness, No organomegaly appriciated, No rebound - guarding or rigidity. No Cyanosis, Clubbing or edema, No new Rash or bruise   Lab Results:  Data Reviewed: I have personally reviewed following labs and imaging studies  Recent Labs  Lab 03/27/20 0328 03/28/20 0524 03/31/20 0411 04/01/20 0500 04/02/20 0354  WBC 17.0* 17.1* 20.2* 20.4* 20.5*  HGB 15.2 15.6 15.1 15.5 14.7  HCT 44.7 44.6 43.3 44.5 43.5  PLT 225 210 246 266 259  MCV 95.3 94.1 94.3 94.7 94.4  MCH 32.4 32.9 32.9 33.0 31.9  MCHC 34.0 35.0 34.9 34.8 33.8  RDW 12.4 12.4 12.2 12.2 12.3  LYMPHSABS 0.6* 0.5*  --  0.5* 0.4*  MONOABS 0.7 0.9  --  0.8 0.8  EOSABS 0.0 0.0  --  0.0 0.0  BASOSABS 0.1 0.0  --  0.1 0.0    Recent Labs  Lab 03/27/20 0328 03/27/20 0328 03/28/20 0524 03/28/20 0524 03/28/20 0634 03/29/20 0204 03/30/20 0457 03/31/20 0411 04/01/20 0500 04/02/20 0354  NA 136   < > 135   < >  --  136 132* 134*  136 136  K 4.1   < > 4.0   < >  --  4.2 4.3 4.5 4.6 4.8  CL 98   < > 95*   < >  --  94* 93* 96* 96* 98  CO2 23   < > 28   < >  --  30 29 28 27 27   GLUCOSE 168*   < > 171*   < >  --  167* 159* 162* 144* 171*  BUN 36*   < > 35*   < >  --  34* 29* 30* 27* 25*  CREATININE 1.06   < > 1.04   < >  --  1.22 1.02 1.07 0.98 0.92  CALCIUM 8.3*   < > 8.5*   < >  --  8.5* 8.4* 8.3* 8.5* 8.3*  AST 36   < > 45*   < >  --  42* 37 38 39 29  ALT 74*   < > 89*   < >  --  93* 86* 89* 99* 86*  ALKPHOS 49   < > 54   < >  --  55 52 51 62 66  BILITOT 1.2   < > 1.5*   < >  --  1.6* 1.6* 1.5* 1.4* 1.2  ALBUMIN 2.5*   < > 2.6*   < >  --  2.8* 2.7* 2.6* 2.6* 2.4*  MG 2.3  --  2.4  --   --   --   --  2.5* 2.5* 2.4  CRP <0.5  --  0.6  --   --   --   --   --  0.5 <0.5  DDIMER >20.00*   < >  --   --  >20.00* >20.00* >20.00* 17.55* 9.80*  --   INR 3.1*   < >  --    < > 2.3* 1.9* 1.6* 1.4*  1.4* 1.4*  BNP  --   --   --   --   --   --   --   --  76.8 91.4   < > = values in this interval not displayed.    Recent Labs  Lab 03/27/20 0328 03/27/20 0328 03/28/20 0524 03/28/20 0524 03/28/20 1610 03/29/20 0204 03/30/20 0457 03/31/20 0411 04/01/20 0500 04/02/20 0354  WBC 17.0*  --  17.1*  --   --   --   --  20.2* 20.4* 20.5*  HGB 15.2  --  15.6  --   --   --   --  15.1 15.5 14.7  HCT 44.7  --  44.6  --   --   --   --  43.3 44.5 43.5  PLT 225  --  210  --   --   --   --  246 266 259  CRP <0.5  --  0.6  --   --   --   --   --  0.5 <0.5  BNP  --   --   --   --   --   --   --   --  76.8 91.4  DDIMER >20.00*   < >  --   --  >20.00* >20.00* >20.00* 17.55* 9.80*  --   AST 36   < > 45*   < >  --  42* 37 38 39 29  ALT 74*   < > 89*   < >  --  93* 86* 89* 99* 86*  ALKPHOS 49   < > 54   < >  --  55 52 51 62 66  BILITOT 1.2   < > 1.5*   < >  --  1.6* 1.6* 1.5* 1.4* 1.2  ALBUMIN 2.5*   < > 2.6*   < >  --  2.8* 2.7* 2.6* 2.6* 2.4*  INR 3.1*   < >  --    < > 2.3* 1.9* 1.6* 1.4* 1.4* 1.4*   < > = values in this interval not  displayed.       Radiology Studies: No results found.     LOS: 10 days   [facility] Pager on [email or web address]  04/02/2020, 10:52 AM

## 2020-04-02 NOTE — Care Management Important Message (Signed)
Important Message  Patient Details  Name: CLEDIS SOHN MRN: [id number] Date of Birth: 24-Sep-1950   Medicare Important Message Given:  Yes - Important Message mailed due to current National Emergency  Verbal consent obtained due to current National Emergency  Relationship to patient: Self Contact Name: Corian Handley Call Date: 04/02/20  Time: 1324 Phone: [phone] Outcome: No Answer/Busy Important Message mailed to: Patient address on file    Delorse Lek 04/02/2020, 1:24 PM

## 2020-04-02 NOTE — Progress Notes (Signed)
ANTICOAGULATION CONSULT NOTE - Follow Up Consult  Pharmacy Consult for lovenox>warfarin Indication: history of PE  Patient Measurements: Height: 5\' 11"  (180.3 cm) Weight: (!) 138.8 kg (306 lb) IBW/kg (Calculated) : 75.3  Vital Signs: Temp: 98.2 F (36.8 C) (10/11 0800) Temp Source: Oral (10/11 0800) BP: 147/95 (10/11 0800) Pulse Rate: 92 (10/11 0800)  Labs: Recent Labs    03/31/20 0411 03/31/20 0411 04/01/20 0500 04/02/20 0354  HGB 15.1   < > 15.5 14.7  HCT 43.3  --  44.5 43.5  PLT 246  --  266 259  LABPROT 16.4*  --  16.2* 16.9*  INR 1.4*  --  1.4* 1.4*  CREATININE 1.07  --  0.98 0.92   < > = values in this interval not displayed.    Estimated Creatinine Clearance: 109.5 mL/min (by C-G formula based on SCr of 0.92 mg/dL).  Assessment: 69 year old male admitted for COVID19 pneumonia. PTA on warfarin 6mg  all days except 3mg  on Monday and Wednesday. Currently on lovenox 140mg  subcu q12h and transitioning to warfarin.   INR remains at 1.4 after 2 doses of coumadin at 6mg . He is now on amiodarone which will make the coumadin much more sensitive. We will give a slightly higher dose today.   Goal of Therapy:  INR 2-3 Monitor platelets by anticoagulation protocol: Yes   Plan:  Continue lovenox 140mg  subcu q12h  Warfarin 7.5mg  by mouth x1 today Monitor CBC, renal func, and daily INR  Onnie Boer, PharmD, BCIDP, AAHIVP, CPP Infectious Disease Pharmacist 04/02/2020 10:09 AM

## 2020-04-03 DIAGNOSIS — J9601 Acute respiratory failure with hypoxia: Secondary | ICD-10-CM | POA: Diagnosis not present

## 2020-04-03 DIAGNOSIS — U071 COVID-19: Secondary | ICD-10-CM | POA: Diagnosis not present

## 2020-04-03 DIAGNOSIS — I4819 Other persistent atrial fibrillation: Secondary | ICD-10-CM | POA: Diagnosis not present

## 2020-04-03 LAB — COMPREHENSIVE METABOLIC PANEL
ALT: 79 U/L — ABNORMAL HIGH (ref 0–44)
AST: 26 U/L (ref 15–41)
Albumin: 2.5 g/dL — ABNORMAL LOW (ref 3.5–5.0)
Alkaline Phosphatase: 61 U/L (ref 38–126)
Anion gap: 10 (ref 5–15)
BUN: 22 mg/dL (ref 8–23)
CO2: 27 mmol/L (ref 22–32)
Calcium: 8.5 mg/dL — ABNORMAL LOW (ref 8.9–10.3)
Chloride: 99 mmol/L (ref 98–111)
Creatinine, Ser: 1 mg/dL (ref 0.61–1.24)
GFR, Estimated: >60 mL/min (ref >60)
Glucose, Bld: 138 mg/dL — ABNORMAL HIGH (ref 70–99)
Potassium: 4.4 mmol/L (ref 3.5–5.1)
Sodium: 136 mmol/L (ref 135–145)
Total Bilirubin: 0.8 mg/dL (ref 0.3–1.2)
Total Protein: 6.3 g/dL — ABNORMAL LOW (ref 6.5–8.1)

## 2020-04-03 LAB — CBC WITH DIFFERENTIAL/PLATELET
Abs Immature Granulocytes: 0.3 10*3/uL — ABNORMAL HIGH (ref 0.00–0.07)
Basophils Absolute: 0 10*3/uL (ref 0.0–0.1)
Basophils Relative: 0 %
Eosinophils Absolute: 0 10*3/uL (ref 0.0–0.5)
Eosinophils Relative: 0 %
HCT: 43.4 % (ref 39.0–52.0)
Hemoglobin: 14.7 g/dL (ref 13.0–17.0)
Immature Granulocytes: 2 %
Lymphocytes Relative: 5 %
Lymphs Abs: 0.8 10*3/uL (ref 0.7–4.0)
MCH: 32 pg (ref 26.0–34.0)
MCHC: 33.9 g/dL (ref 30.0–36.0)
MCV: 94.6 fL (ref 80.0–100.0)
Monocytes Absolute: 1.2 10*3/uL — ABNORMAL HIGH (ref 0.1–1.0)
Monocytes Relative: 6 %
Neutro Abs: 16.1 10*3/uL — ABNORMAL HIGH (ref 1.7–7.7)
Neutrophils Relative %: 87 %
Platelets: 270 10*3/uL (ref 150–400)
RBC: 4.59 MIL/uL (ref 4.22–5.81)
RDW: 12.5 % (ref 11.5–15.5)
WBC: 18.4 10*3/uL — ABNORMAL HIGH (ref 4.0–10.5)
nRBC: 0 % (ref 0.0–0.2)

## 2020-04-03 LAB — BRAIN NATRIURETIC PEPTIDE: B Natriuretic Peptide: 103 pg/mL — ABNORMAL HIGH (ref 0.0–100.0)

## 2020-04-03 LAB — PROCALCITONIN: Procalcitonin: <0.1 ng/mL

## 2020-04-03 LAB — PROTIME-INR
INR: 1.9 — ABNORMAL HIGH (ref 0.8–1.2)
Prothrombin Time: 21.4 seconds — ABNORMAL HIGH (ref 11.4–15.2)

## 2020-04-03 LAB — MAGNESIUM: Magnesium: 2.3 mg/dL (ref 1.7–2.4)

## 2020-04-03 LAB — C-REACTIVE PROTEIN: CRP: 0.5 mg/dL (ref <1.0)

## 2020-04-03 MED ORDER — PREDNISONE 10 MG PO TABS
10.0000 mg | ORAL_TABLET | Freq: Every day | ORAL | Status: DC
  Administered 2020-04-04: 10 mg via ORAL
  Filled 2020-04-03: qty 1

## 2020-04-03 MED ORDER — WARFARIN SODIUM 6 MG PO TABS
6.0000 mg | ORAL_TABLET | Freq: Once | ORAL | Status: AC
  Administered 2020-04-03: 6 mg via ORAL
  Filled 2020-04-03: qty 1

## 2020-04-03 NOTE — Progress Notes (Signed)
Progress Note  Patient Name: Robert [name] Date of Encounter: 04/03/2020  [facility] Cardiologist: Buford Dresser, MD   Subjective   Robert [name] is a 69 y.o. male with a hx of prior DVT/PE, dyslipidemia, obesity, PSVT,  HTN, Mild AS, who was evaluated 03/29/20 in the setting of new A fib with COVID Pneumonia who is being seen today for the evaluation of atrial fib at the request of Dr. Kevan Rosebush.  Stable from yesterday.  Has no concerns about coumadin or tachycardia.  Hopes he will be safe at home when DC is appropriate for COVID.  Inpatient Medications    Scheduled Meds:  amiodarone  400 mg Oral BID   famotidine  20 mg Oral Daily   guaiFENesin  600 mg Oral BID   mouth rinse  15 mL Mouth Rinse BID   methylPREDNISolone (SOLU-MEDROL) injection  40 mg Intravenous Q24H   nortriptyline  25 mg Oral QPM   oxymetazoline  1 spray Each Nare BID   sodium chloride flush  3 mL Intravenous Q12H   Warfarin - Pharmacist Dosing Inpatient   Does not apply q1600   Continuous Infusions:   PRN Meds: acetaminophen, ALPRAZolam, benzonatate, guaiFENesin-dextromethorphan, sodium chloride   Vital Signs    Vitals:   04/02/20 2000 04/03/20 0005 04/03/20 0405 04/03/20 0716  BP: (!) 146/99 (!) 134/95 121/82 129/80  Pulse: 88 87 86 83  Resp: 20 17 18 20   Temp: 97.9 F (36.6 C) 98.1 F (36.7 C) 97.9 F (36.6 C) 97.6 F (36.4 C)  TempSrc: Oral Oral Axillary Oral  SpO2: 91% 91% (!) 89% 91%  Weight:      Height:        Intake/Output Summary (Last 24 hours) at 04/03/2020 1014 Last data filed at 04/03/2020 0913 Gross per 24 hour  Intake 360 ml  Output 1000 ml  Net -640 ml   Last 3 Weights 03/29/2020 03/18/2020 03/17/2020  Weight (lbs) 306 lb 324 lb 11.8 oz 318 lb  Weight (kg) 138.801 kg 147.3 kg 144.244 kg      Telemetry    A fib average rate 80s- Personally Reviewed  ECG    No new- Personally Reviewed  Physical Exam   General appearance: Awake alert.  In no  distress Resp: Coarse breath sounds bilaterally with no wheezesCardio: irregularly irregular but rate controlled  No rubs or bruit. GI: Abdomen is soft.  Nontender nondistended. Extremities: +1 edema bilateral lower extremities Neurologic: Alert and oriented x3.  No focal neurological deficits.   Labs    High Sensitivity Troponin:   Recent Labs  Lab 03/23/20 0703 03/23/20 0949  TROPONINIHS 41* 41*      Chemistry Recent Labs  Lab 04/01/20 0500 04/02/20 0354 04/03/20 0230  NA 136 136 136  K 4.6 4.8 4.4  CL 96* 98 99  CO2 27 27 27   GLUCOSE 144* 171* 138*  BUN 27* 25* 22  CREATININE 0.98 0.92 1.00  CALCIUM 8.5* 8.3* 8.5*  PROT 6.2* 6.0* 6.3*  ALBUMIN 2.6* 2.4* 2.5*  AST 39 29 26  ALT 99* 86* 79*  ALKPHOS 62 66 61  BILITOT 1.4* 1.2 0.8  GFRNONAA >60 >60 >60  ANIONGAP 13 11 10      Hematology Recent Labs  Lab 04/01/20 0500 04/02/20 0354 04/03/20 0230  WBC 20.4* 20.5* 18.4*  RBC 4.70 4.61 4.59  HGB 15.5 14.7 14.7  HCT 44.5 43.5 43.4  MCV 94.7 94.4 94.6  MCH 33.0 31.9 32.0  MCHC 34.8 33.8 33.9  RDW 12.2 12.3 12.5  PLT 266 259 270    DDimer  Recent Labs  Lab 03/30/20 0457 03/31/20 0411 04/01/20 0500  DDIMER >20.00* 17.55* 9.80*    Cardiac Studies   Echo with normal EF on 03-30-20 mildly dilated left atrium, mild AS  Patient Profile     69 y.o. male with Covid pneumonia, elevated D-dimer, atrial fibrillation  Assessment & Plan    Persistent atrial fibrillation/SVT -New diagnosis -Amiodarone load 400 mg twice daily started on 03/29/2020.  Would recommend 1 week of 400 twice daily (04/06/20), 2 weeks of 200 twice daily (04/27/20), then 200 mg thereafter -INR near therapeutic -Has not been able to tolerate metoprolol because of asthma in the past -Diltiazem causes anaphylaxis.  Covid PNA Elevated troponin -Secondary to demand ischemia in the setting of Covid pneumonia. Prior DVT PE -AC as above Morbid obesity -Have discussed exercise post  infection.  BMI of 42.  Will sign off at this time; please reach out with additional questions or concerns  For questions or updates, please contact [place] Please consult [email or web address] for contact info under        Signed, Werner Lean, MD  04/03/2020, 10:14 AM

## 2020-04-03 NOTE — Progress Notes (Signed)
ANTICOAGULATION CONSULT NOTE - Follow Up Consult  Pharmacy Consult for lovenox>warfarin Indication: history of PE  Patient Measurements: Height: 5\' 11"  (180.3 cm) Weight: (!) 138.8 kg (306 lb) IBW/kg (Calculated) : 75.3  Vital Signs: Temp: 98.1 F (36.7 C) (10/12 1140) Temp Source: Oral (10/12 1140) BP: 122/79 (10/12 1140) Pulse Rate: 96 (10/12 1140)  Labs: Recent Labs    04/01/20 0500 04/01/20 0500 04/02/20 0354 04/03/20 0230  HGB 15.5   < > 14.7 14.7  HCT 44.5  --  43.5 43.4  PLT 266  --  259 270  LABPROT 16.2*  --  16.9* 21.4*  INR 1.4*  --  1.4* 1.9*  CREATININE 0.98  --  0.92 1.00   < > = values in this interval not displayed.    Estimated Creatinine Clearance: 100.7 mL/min (by C-G formula based on SCr of 1 mg/dL).  Assessment: 69 year old male admitted for COVID19 pneumonia. PTA on warfarin 6mg  all days except 3mg  on Monday and Wednesday. Currently on lovenox 140mg  subcu q12h and transitioning to warfarin.   INR up to 1.9 today after 7.5mg  yesterday. He is now on amiodarone which will make the coumadin much more sensitive. Lovenox has been dced by MD  Goal of Therapy:  INR 2-3 Monitor platelets by anticoagulation protocol: Yes   Plan:  Warfarin 6mg  by mouth x1 today Monitor CBC, renal func, and daily INR  Onnie Boer, PharmD, BCIDP, AAHIVP, CPP Infectious Disease Pharmacist 04/03/2020 1:28 PM

## 2020-04-03 NOTE — Progress Notes (Signed)
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  He was down to about 3 L of oxygen.  He was discharged with home oxygen.  Apparently became more short of breath at home.  Called EMS.  Oxygen requirements were higher.  He was subsequently hospitalized for further management.  Reason for Visit: Pneumonia due to COVID-19.  Acute respiratory failure with hypoxia  Consultants: Cardiology  Procedures: None  Antibiotics: Anti-infectives (From admission, onward)   None      Subjective/Interval History:  Patient in bed, appears comfortable, denies any headache, no fever, no chest pain or pressure, no shortness of breath , no abdominal pain. No focal weakness.  No further nosebleeds but does feel weak diffusely.   Assessment/Plan:  Acute Hypoxic Resp. Failure/Pneumonia due to COVID-19 - unvaccinated and unfortunately has incurred severe parenchymal lung injury due to COVID-19 pneumonia, being treated with IV steroids, Remdesivir and Baricitinib.  He has finally shown some signs of clinical improvement, advance activity.  Encouraged to sit up in chair use I-S and flutter valve for pulmonary toiletry. Continue to monitor closely.  SpO2: 91 % O2 Flow Rate (L/min): 1 L/min FiO2 (%): 24 %   Recent Labs  Lab 03/28/20 0524 03/28/20 0524 03/28/20 0634 03/29/20 0204 03/29/20 0204 03/30/20 0457 03/31/20 0411 04/01/20 0500 04/02/20 0354 04/02/20 1039 04/03/20 0230  WBC 17.1*  --   --   --   --   --  20.2* 20.4* 20.5*  --  18.4*  CRP 0.6  --   --   --   --   --   --  0.5 <0.5  --  0.5  DDIMER  --   --  >20.00* >20.00*  --  >20.00* 17.55* 9.80*  --   --   --   BNP  --   --   --   --   --   --    --  76.8 91.4  --  103.0*  PROCALCITON  --   --   --   --   --   --   --   --   --  <0.10 <0.10  AST 45*   < >  --  42*   < > 37 38 39 29  --  26  ALT 89*   < >  --  93*   < > 86* 89* 99* 86*  --  79*  ALKPHOS 54   < >  --  55   < > 52 51 62 66  --  61  BILITOT 1.5*   < >  --  1.6*   < > 1.6* 1.5* 1.4* 1.2  --  0.8  ALBUMIN 2.6*   < >  --  2.8*   < > 2.7* 2.6* 2.6* 2.4*  --  2.5*  INR  --    < > 2.3* 1.9*   < > 1.6* 1.4* 1.4* 1.4*  --  1.9*   < > = values in this interval not displayed.         Elevated D-dimer due to intense inflammation.  CTA and leg ultrasound unremarkable was on D-dimer and Lovenox overlap, INR close to 2, stop Lovenox and monitor.   Atrial fibrillation with RVR/concern of  atrial flutter/history of MAT/PSVT -Mali vas 2 score of greater than 2, also has history of previous DVTs. - Patient does have a history of MAT and SVT but no previously documented history of atrial fibrillation. Stabile TSH and echocardiogram, cardiology on board.  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 1 week of 400 twice daily (04/06/20), 2 weeks of 200 twice daily (04/27/20), then 200 mg thereafter.  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  CT angiogram was negative.  Lower extremity Dopplers negative for DVT.  Was on Lovenox and Coumadin overlap, INR near therapeutic, chronically on Coumadin, does not want to try newer oral anticoagulation agents.  Mild Nose bleed - due to dryness from oxygen, improved once he is wearing a facial mask, saline nasal drops and Afrin spray provided.  This problem has resolved.  Transaminitis - Most likely due to COVID-19.  Stable.  Continue to monitor periodically.  Essential hypertension - Blood pressure is reasonably well controlled.  Continue to monitor.  Lower extremity edema - Echocardiogram showed normal systolic function without any significant valvular  abnormalities.  Lower extremity Doppler study negative for DVT.  Keep legs elevated as much as possible.  Improvement noted with PRN Lasix.  History of bronchial asthma - Stable.  No wheezing appreciated at this time.  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  Finished IV Solu-Medrol and resume home dose prednisone.  Recent acute metabolic encephalopathy - Apparently was confused during his previous hospitalization.  Seems to be at baseline currently.  Situational anxiety - Apparently patient sister is quite sick with terminal cancer and is under hospice care at home.  She might pass away soon.  Patient with occasional crying episodes.  Pamelor dose was increased.  Continue alprazolam as needed.  Hopefully tapering of steroids will also help.  Morbid obesity - BMI of 46, follow with PCP for weight loss.    DVT Prophylaxis: Therapeutic Lovenox/Coumadin Code Status: Limited resuscitation: No intubation but all other interventions are fine.   Family Communication: Wife being updated daily.   Disposition Plan: Hopefully return home when improved.  Noted to be very deconditioned with increase in heart rate noted yesterday with minimal exertion.  Needs to be mobilized more before he can be safely discharged.    Status is: Inpatient  Remains inpatient appropriate because:IV treatments appropriate due to intensity of illness or inability to take PO and Inpatient level of care appropriate due to severity of illness   Dispo:  Patient From: Home  Planned Disposition: Home  Expected discharge date: 04/03/20  Medically stable for discharge: No   Medications:  Scheduled: . amiodarone  400 mg Oral BID  . famotidine  20 mg Oral Daily  . guaiFENesin  600 mg Oral BID  . mouth rinse  15 mL Mouth Rinse BID  . methylPREDNISolone (SOLU-MEDROL) injection  40 mg Intravenous Q24H  . nortriptyline  25 mg Oral QPM  .  oxymetazoline  1 spray Each Nare BID  . sodium chloride flush  3 mL Intravenous Q12H  . Warfarin - Pharmacist Dosing Inpatient   Does not apply q1600   Continuous:  LKG:[id number], ALPRAZolam, benzonatate, guaiFENesin-dextromethorphan, sodium chloride   Objective:  Vital Signs  Vitals:   04/02/20 2000 04/03/20 0005 04/03/20 0405 04/03/20 0716  BP: (!) 146/99 (!) 134/95 121/82 129/80  Pulse: 88 87 86 83  Resp: 20  17 18 20   Temp: 97.9 F (36.6 C) 98.1 F (36.7 C) 97.9 F (36.6 C) 97.6 F (36.4 C)  TempSrc: Oral Oral Axillary Oral  SpO2: 91% 91% (!) 89% 91%  Weight:      Height:        Intake/Output Summary (Last 24 hours) at 04/03/2020 1017 Last data filed at 04/03/2020 0913 Gross per 24 hour  Intake 360 ml  Output 1000 ml  Net -640 ml   Filed Weights   03/29/20 0407  Weight: (!) 138.8 kg    Exam  Awake Alert, No new F.N deficits, Normal affect [place].AT,PERRAL Supple Neck,No JVD, No cervical lymphadenopathy appriciated.  Symmetrical Chest wall movement, Good air movement bilaterally, CTAB RRR,No Gallops, Rubs or new Murmurs, No Parasternal Heave +ve B.Sounds, Abd Soft, No tenderness, No organomegaly appriciated, No rebound - guarding or rigidity. No Cyanosis, Clubbing or edema, No new Rash or bruise   Lab Results:  Data Reviewed: I have personally reviewed following labs and imaging studies  Recent Labs  Lab 03/28/20 0524 03/31/20 0411 04/01/20 0500 04/02/20 0354 04/03/20 0230  WBC 17.1* 20.2* 20.4* 20.5* 18.4*  HGB 15.6 15.1 15.5 14.7 14.7  HCT 44.6 43.3 44.5 43.5 43.4  PLT 210 246 266 259 270  MCV 94.1 94.3 94.7 94.4 94.6  MCH 32.9 32.9 33.0 31.9 32.0  MCHC 35.0 34.9 34.8 33.8 33.9  RDW 12.4 12.2 12.2 12.3 12.5  LYMPHSABS 0.5*  --  0.5* 0.4* 0.8  MONOABS 0.9  --  0.8 0.8 1.2*  EOSABS 0.0  --  0.0 0.0 0.0  BASOSABS 0.0  --  0.1 0.0 0.0    Recent Labs  Lab 03/28/20 0524 03/28/20 0524 03/28/20 0634 03/29/20 0204 03/29/20 0204  03/30/20 0457 03/31/20 0411 04/01/20 0500 04/02/20 0354 04/02/20 1039 04/03/20 0230  NA 135   < >  --  136   < > 132* 134* 136 136  --  136  K 4.0   < >  --  4.2   < > 4.3 4.5 4.6 4.8  --  4.4  CL 95*   < >  --  94*   < > 93* 96* 96* 98  --  99  CO2 28   < >  --  30   < > 29 28 27 27   --  27  GLUCOSE 171*   < >  --  167*   < > 159* 162* 144* 171*  --  138*  BUN 35*   < >  --  34*   < > 29* 30* 27* 25*  --  22  CREATININE 1.04   < >  --  1.22   < > 1.02 1.07 0.98 0.92  --  1.00  CALCIUM 8.5*   < >  --  8.5*   < > 8.4* 8.3* 8.5* 8.3*  --  8.5*  AST 45*   < >  --  42*   < > 37 38 39 29  --  26  ALT 89*   < >  --  93*   < > 86* 89* 99* 86*  --  79*  ALKPHOS 54   < >  --  55   < > 52 51 62 66  --  61  BILITOT 1.5*   < >  --  1.6*   < > 1.6* 1.5* 1.4* 1.2  --  0.8  ALBUMIN 2.6*   < >  --  2.8*   < >  2.7* 2.6* 2.6* 2.4*  --  2.5*  MG 2.4  --   --   --   --   --  2.5* 2.5* 2.4  --  2.3  CRP 0.6  --   --   --   --   --   --  0.5 <0.5  --  0.5  DDIMER  --   --  >20.00* >20.00*  --  >20.00* 17.55* 9.80*  --   --   --   PROCALCITON  --   --   --   --   --   --   --   --   --  <0.10 <0.10  INR  --    < > 2.3* 1.9*   < > 1.6* 1.4* 1.4* 1.4*  --  1.9*  BNP  --   --   --   --   --   --   --  76.8 91.4  --  103.0*   < > = values in this interval not displayed.    Recent Labs  Lab 03/28/20 0524 03/28/20 0524 03/28/20 0634 03/29/20 0204 03/29/20 0204 03/30/20 0457 03/31/20 0411 04/01/20 0500 04/02/20 0354 04/02/20 1039 04/03/20 0230  WBC 17.1*  --   --   --   --   --  20.2* 20.4* 20.5*  --  18.4*  HGB 15.6  --   --   --   --   --  15.1 15.5 14.7  --  14.7  HCT 44.6  --   --   --   --   --  43.3 44.5 43.5  --  43.4  PLT 210  --   --   --   --   --  246 266 259  --  270  CRP 0.6  --   --   --   --   --   --  0.5 <0.5  --  0.5  BNP  --   --   --   --   --   --   --  76.8 91.4  --  103.0*  DDIMER  --   --  >20.00* >20.00*  --  >20.00* 17.55* 9.80*  --   --   --   PROCALCITON  --   --   --    --   --   --   --   --   --  <0.10 <0.10  AST 45*   < >  --  42*   < > 37 38 39 29  --  26  ALT 89*   < >  --  93*   < > 86* 89* 99* 86*  --  79*  ALKPHOS 54   < >  --  55   < > 52 51 62 66  --  61  BILITOT 1.5*   < >  --  1.6*   < > 1.6* 1.5* 1.4* 1.2  --  0.8  ALBUMIN 2.6*   < >  --  2.8*   < > 2.7* 2.6* 2.6* 2.4*  --  2.5*  INR  --    < > 2.3* 1.9*   < > 1.6* 1.4* 1.4* 1.4*  --  1.9*   < > = values in this interval not displayed.       Radiology Studies: No results found.     LOS: 11 days   [facility] Pager on [email or web address]  04/03/2020,  10:17 AM

## 2020-04-03 NOTE — TOC Progression Note (Signed)
Transition of Care [facility]) - Progression Note    Patient Details  Name: Robert [name] MRN: [id number] Date of Birth: 1951-04-21  Transition of [facility]) CM/SW [place], RN Phone Number: 04/03/2020, 9:57 AM  Clinical Narrative:    Case manager called patient on cell phone due to [place].  Discussed Lovenox cost of Lovenox 150 mg: [employer] Co-Pay: $92.69 for 90 day mail order/ $32.03 for 30 day retail, patient denies issues with obtaining or affording medications.  Case manager also verified again that patient does not have any DME's at home other than oxygen.  PT/OT recommended patient get bariatric rolling walker with seat and 3 in 1 commode, patient understands and has no preference on agencies to provide equipment, CM called [employer], Freda Munro, ordered equipment.  TOC following for progression of care.   Expected Discharge Plan: [place] Barriers to Discharge: No Barriers Identified  Expected Discharge Plan and Services Expected Discharge Plan: [place]   Discharge Planning Services: CM Consult Post Acute Care Choice: Durable Medical Equipment Living arrangements for the past 2 months: Single Family Home                 DME Arranged: 3-N-1, Walker rolling with seat (bari rollator) DME Agency: [employer] Date DME Agency Contacted: 04/03/20 Time DME Agency Contacted: [phone] Representative spoke with at DME Agency: Freda Munro HH Arranged: PT, OT [place] Agency: [facility] Date [place]: 03/28/20 Time [place]: 1139 Representative spoke with at [place]: Amy   Social Determinants of Health ([place]) Interventions    Readmission Risk Interventions No flowsheet data found.

## 2020-04-04 DIAGNOSIS — I825Y3 Chronic embolism and thrombosis of unspecified deep veins of proximal lower extremity, bilateral: Secondary | ICD-10-CM

## 2020-04-04 DIAGNOSIS — I10 Essential (primary) hypertension: Secondary | ICD-10-CM | POA: Diagnosis not present

## 2020-04-04 DIAGNOSIS — J9601 Acute respiratory failure with hypoxia: Secondary | ICD-10-CM | POA: Diagnosis not present

## 2020-04-04 DIAGNOSIS — U071 COVID-19: Secondary | ICD-10-CM | POA: Diagnosis not present

## 2020-04-04 LAB — COMPREHENSIVE METABOLIC PANEL
ALT: 77 U/L — ABNORMAL HIGH (ref 0–44)
AST: 25 U/L (ref 15–41)
Albumin: 2.4 g/dL — ABNORMAL LOW (ref 3.5–5.0)
Alkaline Phosphatase: 58 U/L (ref 38–126)
Anion gap: 10 (ref 5–15)
BUN: 25 mg/dL — ABNORMAL HIGH (ref 8–23)
CO2: 25 mmol/L (ref 22–32)
Calcium: 8.5 mg/dL — ABNORMAL LOW (ref 8.9–10.3)
Chloride: 100 mmol/L (ref 98–111)
Creatinine, Ser: 0.98 mg/dL (ref 0.61–1.24)
GFR, Estimated: >60 mL/min (ref >60)
Glucose, Bld: 127 mg/dL — ABNORMAL HIGH (ref 70–99)
Potassium: 4.7 mmol/L (ref 3.5–5.1)
Sodium: 135 mmol/L (ref 135–145)
Total Bilirubin: 1 mg/dL (ref 0.3–1.2)
Total Protein: 6 g/dL — ABNORMAL LOW (ref 6.5–8.1)

## 2020-04-04 LAB — CBC WITH DIFFERENTIAL/PLATELET
Abs Immature Granulocytes: 0.36 10*3/uL — ABNORMAL HIGH (ref 0.00–0.07)
Basophils Absolute: 0 10*3/uL (ref 0.0–0.1)
Basophils Relative: 0 %
Eosinophils Absolute: 0 10*3/uL (ref 0.0–0.5)
Eosinophils Relative: 0 %
HCT: 42.7 % (ref 39.0–52.0)
Hemoglobin: 14.7 g/dL (ref 13.0–17.0)
Immature Granulocytes: 2 %
Lymphocytes Relative: 5 %
Lymphs Abs: 0.8 10*3/uL (ref 0.7–4.0)
MCH: 32.7 pg (ref 26.0–34.0)
MCHC: 34.4 g/dL (ref 30.0–36.0)
MCV: 95.1 fL (ref 80.0–100.0)
Monocytes Absolute: 1 10*3/uL (ref 0.1–1.0)
Monocytes Relative: 6 %
Neutro Abs: 13.6 10*3/uL — ABNORMAL HIGH (ref 1.7–7.7)
Neutrophils Relative %: 87 %
Platelets: 263 10*3/uL (ref 150–400)
RBC: 4.49 MIL/uL (ref 4.22–5.81)
RDW: 12.7 % (ref 11.5–15.5)
WBC: 15.8 10*3/uL — ABNORMAL HIGH (ref 4.0–10.5)
nRBC: 0 % (ref 0.0–0.2)

## 2020-04-04 LAB — C-REACTIVE PROTEIN: CRP: 1 mg/dL — ABNORMAL HIGH (ref <1.0)

## 2020-04-04 LAB — BRAIN NATRIURETIC PEPTIDE: B Natriuretic Peptide: 107.7 pg/mL — ABNORMAL HIGH (ref 0.0–100.0)

## 2020-04-04 LAB — PROTIME-INR
INR: 2.1 — ABNORMAL HIGH (ref 0.8–1.2)
Prothrombin Time: 22.9 seconds — ABNORMAL HIGH (ref 11.4–15.2)

## 2020-04-04 LAB — PROCALCITONIN: Procalcitonin: <0.1 ng/mL

## 2020-04-04 LAB — MAGNESIUM: Magnesium: 2.1 mg/dL (ref 1.7–2.4)

## 2020-04-04 MED ORDER — AMIODARONE HCL 200 MG PO TABS
ORAL_TABLET | ORAL | 0 refills | Status: DC
Start: 2020-04-04 — End: 2020-04-27

## 2020-04-04 MED ORDER — FUROSEMIDE 10 MG/ML IJ SOLN
20.0000 mg | Freq: Once | INTRAMUSCULAR | Status: AC
  Administered 2020-04-04: 20 mg via INTRAVENOUS
  Filled 2020-04-04: qty 2

## 2020-04-04 MED ORDER — FAMOTIDINE 20 MG PO TABS
20.0000 mg | ORAL_TABLET | Freq: Every day | ORAL | 0 refills | Status: DC
Start: 2020-04-05 — End: 2020-05-31

## 2020-04-04 MED ORDER — PREDNISONE 5 MG PO TABS
5.0000 mg | ORAL_TABLET | Freq: Every day | ORAL | 0 refills | Status: DC
Start: 2020-04-04 — End: 2020-07-13

## 2020-04-04 MED ORDER — GUAIFENESIN-DM 100-10 MG/5ML PO SYRP: 10.0000 mL | ORAL_SOLUTION | ORAL | 0 refills | Status: DC | PRN

## 2020-04-04 NOTE — Progress Notes (Signed)
Occupational Therapy Treatment Patient Details Name: Robert [name] MRN: [id number] DOB: [birth year] Today's Date: 04/04/2020    History of present illness 69 yo male presenting via EMS with increased shortness of breath. Recent admission (9/26) with COVID-19 pneumonia and discharged (9/30) to home on 3L O2. 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   OT comments  Pt with gradual progress towards OT goals. He is fatigued today (reports bad night of sleep) and continues to present with generalized weakness and decreased activity tolerance, but does demonstrate good motivation to return to his PLOF. Educated pt in use of AE for LB bathing and toileting ADL with pt verbalizing understanding, AE issued this session to maximize his overall safety and independence with tasks after return home. Pt reports will return home with spouse/son assist as well. Pt verbalizing good understanding of energy conservation techniques reviewed today. Performing session on RA with SpO2 >/=89%, max HR up to 130 with minimal standing activity. Continue to recommend follow up [facility] services after discharge to further progress his strength and endurance for mobility/ADL tasks. Will follow while acutely admitted.    Follow Up Recommendations  Home health OT    Equipment Recommendations  3 in 1 bedside commode (WIDE)          Precautions / Restrictions Precautions Precautions: Other (comment) Precaution Comments: Watch SpO2 and HR Restrictions Weight Bearing Restrictions: No       Mobility Bed Mobility               General bed mobility comments: Received sitting in recliner  Transfers Overall transfer level: Needs assistance Equipment used: None Transfers: Sit to/from Stand Sit to Stand: Supervision         General transfer comment: Supervision for safety, increased time and use of momentum     Balance Overall balance assessment: Needs  assistance Sitting-balance support: Feet supported;No upper extremity supported Sitting balance-Leahy Scale: Good     Standing balance support: During functional activity Standing balance-Leahy Scale: Fair                             ADL either performed or assessed with clinical judgement   ADL Overall ADL's : Needs assistance/impaired               Lower Body Bathing Details (indicate cue type and reason): educated pt in use of AE for LB bathing, pt verbalizing understanding                Toileting - Clothing Manipulation Details (indicate cue type and reason): educated pt in use of toilet aide to increase safety and independence with task      Functional mobility during ADLs: Supervision/safety General ADL Comments: issued appropriate AE for ADL. further reviewed energy conservation techniques for completing ADL tasks after return home with pt verbalizing understanding. pt fatigued today                        Cognition Arousal/Alertness: Awake/alert Behavior During Therapy: WFL for tasks assessed/performed Overall Cognitive Status: Within Functional Limits for tasks assessed                                          Exercises Exercises: Other exercises;General Lower Extremity General Exercises - Lower Extremity Ankle Circles/Pumps: AROM;Both Long Arc  Quad: AROM;Both Hip Flexion/Marching: AROM;Both Other Exercises Other Exercises: use of flutter valve x10 Other Exercises: IS x5, pulling to min 1500  Other Exercises: standing marching x15 steps (approx)    Shoulder Instructions       General Comments      Pertinent Vitals/ Pain       Pain Assessment: No/denies pain  Home Living                                          Prior Functioning/Environment              Frequency  Min 2X/week        Progress Toward Goals  OT Goals(current goals can now be found in the care plan section)   Progress towards OT goals: Progressing toward goals  Acute Rehab OT Goals Patient Stated Goal: home OT Goal Formulation: With patient Time For Goal Achievement: 04/10/20 Potential to Achieve Goals: Good ADL Goals Pt Will Perform Grooming: with modified independence;standing;sitting Pt Will Transfer to Toilet: with modified independence;ambulating;regular height toilet Pt Will Perform Toileting - Clothing Manipulation and hygiene: with modified independence;sitting/lateral leans;sit to/from stand Pt/caregiver will Perform Home Exercise Program: Increased strength;Independently;With written HEP provided;Both right and left upper extremity Additional ADL Goal #1: Pt will independently monitor SpO2 and use purse lip breathing for ADLs Additional ADL Goal #2: Pt will independently verbalize three energy conservation techniques for ADLs and IADLs  Plan Discharge plan remains appropriate;Equipment recommendations need to be updated    Co-evaluation                 AM-PAC OT "6 Clicks" Daily Activity     Outcome Measure   Help from another person eating meals?: A Little Help from another person taking care of personal grooming?: A Little Help from another person toileting, which includes using toliet, bedpan, or urinal?: A Little Help from another person bathing (including washing, rinsing, drying)?: A Little Help from another person to put on and taking off regular upper body clothing?: A Little Help from another person to put on and taking off regular lower body clothing?: A Little 6 Click Score: 18    End of Session    OT Visit Diagnosis: Other (comment) (decreased pulmonary tolerance)   Activity Tolerance Patient tolerated treatment well   Patient Left in chair;with call bell/phone within reach   Nurse Communication Mobility status        Time: 5035-4656 OT Time Calculation (min): 22 min  Charges: OT General Charges $OT Visit: 1 Visit OT Treatments $Self Care/Home  Management : 8-22 mins  Lou Cal, OT Acute Rehabilitation Services Pager [phone] Office [phone]    Raymondo Band 04/04/2020, 11:30 AM

## 2020-04-04 NOTE — Progress Notes (Signed)
Patient discharging home. Vital signs stable at time of discharge as reflected in discharge summary. Discharge instructions given and verbal understanding returned. Patient discharging with bed side commode and Rolator walker. Patient to follow up with PCP within a week.

## 2020-04-04 NOTE — Discharge Instructions (Signed)
Follow with Primary MD [name], [name], MD in 7 days   Get CBC, CMP, INR, 2 view Chest X ray -  checked next visit within 1 week by Primary MD   Activity: As tolerated with Full fall precautions use walker/cane & assistance as needed  Disposition Home   Diet: Heart Healthy    Special Instructions: If you have smoked or chewed Tobacco  in the last 2 yrs please stop smoking, stop any regular Alcohol  and or any Recreational drug use.  On your next visit with your primary care physician please Get Medicines reviewed and adjusted.  Please request your Prim.MD to go over all Hospital Tests and Procedure/Radiological results at the follow up, please get all Hospital records sent to your Prim MD by signing hospital release before you go home.  If you experience worsening of your admission symptoms, develop shortness of breath, life threatening emergency, suicidal or homicidal thoughts you must seek medical attention immediately by calling 911 or calling your MD immediately  if symptoms less severe.  You Must read complete instructions/literature along with all the possible adverse reactions/side effects for all the Medicines you take and that have been prescribed to you. Take any new Medicines after you have completely understood and accpet all the possible adverse reactions/side effects.        Person Under Monitoring Name: Robert [name]  Location: [place] [address]   Infection Prevention Recommendations for Individuals Confirmed to have, or Being Evaluated for, 2019 Novel Coronavirus (COVID-19) Infection Who Receive Care at Home  Individuals who are confirmed to have, or are being evaluated for, COVID-19 should follow the prevention steps below until a healthcare provider or local or state health department says they can return to normal activities.  Stay home except to get medical care You should restrict activities outside your home, except for  getting medical care. Do not go to work, school, or public areas, and do not use public transportation or taxis.  Call ahead before visiting your doctor Before your medical appointment, call the healthcare provider and tell them that you have, or are being evaluated for, COVID-19 infection. This will help the healthcare provider's office take steps to keep other people from getting infected. Ask your healthcare provider to call the local or state health department.  Monitor your symptoms Seek prompt medical attention if your illness is worsening (e.g., difficulty breathing). Before going to your medical appointment, call the healthcare provider and tell them that you have, or are being evaluated for, COVID-19 infection. Ask your healthcare provider to call the local or state health department.  Wear a facemask You should wear a facemask that covers your nose and mouth when you are in the same room with other people and when you visit a healthcare provider. People who live with or visit you should also wear a facemask while they are in the same room with you.  Separate yourself from other people in your home As much as possible, you should stay in a different room from other people in your home. Also, you should use a separate bathroom, if available.  Avoid sharing household items You should not share dishes, drinking glasses, cups, eating utensils, towels, bedding, or other items with other people in your home. After using these items, you should wash them thoroughly with soap and water.  Cover your coughs and sneezes Cover your mouth and nose with a tissue when you cough or sneeze, or you can  cough or sneeze into your sleeve. Throw used tissues in a lined trash can, and immediately wash your hands with soap and water for at least 20 seconds or use an alcohol-based hand rub.  Wash your [facility] your hands often and thoroughly with soap and water for at least 20 seconds. You can use  an alcohol-based hand sanitizer if soap and water are not available and if your hands are not visibly dirty. Avoid touching your eyes, nose, and mouth with unwashed hands.   Prevention Steps for Caregivers and Household Members of Individuals Confirmed to have, or Being Evaluated for, COVID-19 Infection Being Cared for in the Home  If you live with, or provide care at home for, a person confirmed to have, or being evaluated for, COVID-19 infection please follow these guidelines to prevent infection:  Follow healthcare provider's instructions Make sure that you understand and can help the patient follow any healthcare provider instructions for all care.  Provide for the patient's basic needs You should help the patient with basic needs in the home and provide support for getting groceries, prescriptions, and other personal needs.  Monitor the patient's symptoms If they are getting sicker, call his or her medical provider and tell them that the patient has, or is being evaluated for, COVID-19 infection. This will help the healthcare provider's office take steps to keep other people from getting infected. Ask the healthcare provider to call the local or state health department.  Limit the number of people who have contact with the patient  If possible, have only one caregiver for the patient.  Other household members should stay in another home or place of residence. If this is not possible, they should stay  in another room, or be separated from the patient as much as possible. Use a separate bathroom, if available.  Restrict visitors who do not have an essential need to be in the home.  Keep older adults, very young children, and other sick people away from the patient Keep older adults, very young children, and those who have compromised immune systems or chronic health conditions away from the patient. This includes people with chronic heart, lung, or kidney conditions, diabetes,  and cancer.  Ensure good ventilation Make sure that shared spaces in the home have good air flow, such as from an air conditioner or an opened window, weather permitting.  Wash your hands often  Wash your hands often and thoroughly with soap and water for at least 20 seconds. You can use an alcohol based hand sanitizer if soap and water are not available and if your hands are not visibly dirty.  Avoid touching your eyes, nose, and mouth with unwashed hands.  Use disposable paper towels to dry your hands. If not available, use dedicated cloth towels and replace them when they become wet.  Wear a facemask and gloves  Wear a disposable facemask at all times in the room and gloves when you touch or have contact with the patient's blood, body fluids, and/or secretions or excretions, such as sweat, saliva, sputum, nasal mucus, vomit, urine, or feces.  Ensure the mask fits over your nose and mouth tightly, and do not touch it during use.  Throw out disposable facemasks and gloves after using them. Do not reuse.  Wash your hands immediately after removing your facemask and gloves.  If your personal clothing becomes contaminated, carefully remove clothing and launder. Wash your hands after handling contaminated clothing.  Place all used disposable facemasks, gloves, and  other waste in a lined container before disposing them with other household waste.  Remove gloves and wash your hands immediately after handling these items.  Do not share dishes, glasses, or other household items with the patient  Avoid sharing household items. You should not share dishes, drinking glasses, cups, eating utensils, towels, bedding, or other items with a patient who is confirmed to have, or being evaluated for, COVID-19 infection.  After the person uses these items, you should wash them thoroughly with soap and water.  Wash laundry thoroughly  Immediately remove and wash clothes or bedding that have blood,  body fluids, and/or secretions or excretions, such as sweat, saliva, sputum, nasal mucus, vomit, urine, or feces, on them.  Wear gloves when handling laundry from the patient.  Read and follow directions on labels of laundry or clothing items and detergent. In general, wash and dry with the warmest temperatures recommended on the label.  Clean all areas the individual has used often  Clean all touchable surfaces, such as counters, tabletops, doorknobs, bathroom fixtures, toilets, phones, keyboards, tablets, and bedside tables, every day. Also, clean any surfaces that may have blood, body fluids, and/or secretions or excretions on them.  Wear gloves when cleaning surfaces the patient has come in contact with.  Use a diluted bleach solution (e.g., dilute bleach with 1 part bleach and 10 parts water) or a household disinfectant with a label that says EPA-registered for coronaviruses. To make a bleach solution at home, add 1 tablespoon of bleach to 1 quart (4 cups) of water. For a larger supply, add  cup of bleach to 1 gallon (16 cups) of water.  Read labels of cleaning products and follow recommendations provided on product labels. Labels contain instructions for safe and effective use of the cleaning product including precautions you should take when applying the product, such as wearing gloves or eye protection and making sure you have good ventilation during use of the product.  Remove gloves and wash hands immediately after cleaning.  Monitor yourself for signs and symptoms of illness Caregivers and household members are considered close contacts, should monitor their health, and will be asked to limit movement outside of the home to the extent possible. Follow the monitoring steps for close contacts listed on the symptom monitoring form.   ? If you have additional questions, contact your local health department or call the epidemiologist on call at [phone] (available 24/7). ? This  guidance is subject to change. For the most up-to-date guidance from [facility], please refer to their website: [email or web address]

## 2020-04-04 NOTE — Progress Notes (Signed)
Physical Therapy Treatment Patient Details Name: Robert [name] MRN: [id number] DOB: 06/30/1950 Today's Date: 04/04/2020    History of Present Illness Pt is a 69 y.o. male recently d/c home on 3L O2  after admission for COVID-19 (03/18/20-03/22/20), now readmitted 03/23/20 with increased shortness of breath. Workup for acute hypoxic respiratory failure secondary to COVID-19 PNA. 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   PT Comments    Pt seen in preparation for d/c home this afternoon. Reviewed educ re: current condition, activity recommendations, energy conservation strategies, rollator use. Pt able to don clothes with minA, requiring use of DME for LB dressing and frequent seated rest breaks secondary to DOE. Continues to have slow, guarded/stiff ambulation without DME. Recommend follow-up with HHPT services to maximize functional mobility and independence.    Follow Up Recommendations  Home health PT;Supervision for mobility/OOB     Equipment Recommendations   (bari rollator)    Recommendations for Other Services       Precautions / Restrictions Precautions Precautions: Fall Precaution Comments: Watch SpO2 and HR Restrictions Weight Bearing Restrictions: No    Mobility  Bed Mobility               General bed mobility comments: Received sitting in recliner  Transfers Overall transfer level: Needs assistance Equipment used: None;4-wheeled walker Transfers: Sit to/from Stand Sit to Stand: Supervision         General transfer comment: Multiple sit<>stands from recliner to don clothes and ambulate, supervision for safety; cues to lock rollator brakes before sitting for rest break on seat  Ambulation/Gait Ambulation/Gait assistance: Supervision Gait Distance (Feet): 12 Feet Assistive device: None Gait Pattern/deviations: Step-through pattern;Decreased stride length;Wide base of support Gait velocity: Decreased   General  Gait Details: Slow, stiff gait without DME, reaching to bed rail for support; pt walking to rollator seat in preparation for d/c   Stairs             Wheelchair Mobility    Modified Rankin (Stroke Patients Only)       Balance Overall balance assessment: Needs assistance Sitting-balance support: Feet supported;No upper extremity supported Sitting balance-Leahy Scale: Good Sitting balance - Comments: Able to don boxers and shorts with use of reacher to reach feet/ankles   Standing balance support: During functional activity Standing balance-Leahy Scale: Fair Standing balance comment: Able to stand and adjust pants/boxers without UE support; dynamic stability improved with UE support                            Cognition Arousal/Alertness: Awake/alert Behavior During Therapy: WFL for tasks assessed/performed Overall Cognitive Status: Within Functional Limits for tasks assessed                                        Exercises General Exercises - Lower Extremity Ankle Circles/Pumps: AROM;Both Long Arc Quad: AROM;Both Hip Flexion/Marching: AROM;Both Other Exercises Other Exercises: use of flutter valve x10 Other Exercises: IS x5, pulling to min 1500  Other Exercises: standing marching x15 steps (approx)     General Comments General comments (skin integrity, edema, etc.): Preparing for d/c - reviewed DME use and activity/therex recommendations, energy conservation strategies with ADL tasks; pt able to demonstrates good use of reacher provided by OT earlier      Pertinent Vitals/Pain Pain Assessment: No/denies pain    Home  Living                      Prior Function            PT Goals (current goals can now be found in the care plan section) Acute Rehab PT Goals Patient Stated Goal: home Progress towards PT goals: Progressing toward goals    Frequency    Min 3X/week      PT Plan Discharge plan needs to be updated     Co-evaluation              AM-PAC PT "6 Clicks" Mobility   Outcome Measure  Help needed turning from your back to your side while in a flat bed without using bedrails?: None Help needed moving from lying on your back to sitting on the side of a flat bed without using bedrails?: None Help needed moving to and from a bed to a chair (including a wheelchair)?: None Help needed standing up from a chair using your arms (e.g., wheelchair or bedside chair)?: None Help needed to walk in hospital room?: None Help needed climbing 3-5 steps with a railing? : A Little 6 Click Score: 23    End of Session   Activity Tolerance: Patient tolerated treatment well Patient left: with nursing/sitter in room (seated on rollator with NT for d/c)   PT Visit Diagnosis: Other abnormalities of gait and mobility (R26.89)     Time: 0940-7680 PT Time Calculation (min) (ACUTE ONLY): 18 min  Charges:  $Therapeutic Activity: 8-22 mins                     Mabeline Caras, PT, DPT Acute Rehabilitation Services  Pager [phone] Office [place] 04/04/2020, 1:53 PM

## 2020-04-04 NOTE — Discharge Summary (Signed)
Robert [name] EHM:[id number] DOB: 1950-10-30 DOA: 03/23/2020  PCP: Cassandria Anger, MD  Admit date: 03/23/2020  Discharge date: 04/04/2020  Admitted From: Home  Disposition:  Home   Recommendations for Outpatient Follow-up:   Follow up with PCP in 1-2 weeks  PCP Please obtain BMP/CBC, 2 view CXR in 1week,  (see Discharge instructions)   PCP Please follow up on the following pending results: Check INR, CBC, CMP and a two-view chest x-ray in 7 to 10 days.  Kindly review CT scan, ultrasound and echocardiogram report in detail.   Home Health: PT,RN   Equipment/Devices: o2 2lit  Consultations: Cards Discharge Condition: Stable    CODE STATUS: Full    Diet Recommendation: Heart Healthy   Diet Order            Diet - low sodium heart healthy           Diet 2 gram sodium Room service appropriate? Yes; Fluid consistency: Thin  Diet effective now                  Chief Complaint  Patient presents with  . 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  He was down to about 3 L of oxygen.  He was discharged with home oxygen.  Apparently became more short of breath at home.  Called EMS.  Oxygen requirements were higher.  He was subsequently hospitalized for further management.                                                                 Hospital Course   Acute Hypoxic Resp. Failure/Pneumonia due to COVID-19 - unvaccinated and unfortunately has incurred severe parenchymal lung injury due to COVID-19 pneumonia, was treated with IV  steroids, Remdesivir and Baricitinib.  He has finally shown excellent improvement and now at rest he is on room air, on ambulation requires 1 to 2 L of nasal cannula oxygen, will be discharged home with close outpatient follow-up with PCP.  Home oxygen provided.  SpO2: 91 % O2 Flow Rate (L/min): 1 L/min FiO2 (%): 24 %  Recent Labs  Lab 03/29/20 0204 03/29/20 0204 03/30/20 0457 03/30/20 0457 03/31/20 0411 04/01/20 0500 04/02/20 0354 04/02/20 1039 04/03/20 0230 04/04/20 0227  WBC  --   --   --   --  20.2* 20.4* 20.5*  --  18.4* 15.8*  CRP  --   --   --   --   --  0.5 <0.5  --  0.5 1.0*  DDIMER >20.00*  --  >  20.00*  --  17.55* 9.80*  --   --   --   --   BNP  --   --   --   --   --  76.8 91.4  --  103.0* 107.7*  PROCALCITON  --   --   --   --   --   --   --  <0.10 <0.10 <0.10  AST 42*   < > 37   < > 38 39 29  --  26 25  ALT 93*   < > 86*   < > 89* 99* 86*  --  79* 77*  ALKPHOS 55   < > 52   < > 51 62 66  --  61 58  BILITOT 1.6*   < > 1.6*   < > 1.5* 1.4* 1.2  --  0.8 1.0  ALBUMIN 2.8*   < > 2.7*   < > 2.6* 2.6* 2.4*  --  2.5* 2.4*  INR 1.9*   < > 1.6*   < > 1.4* 1.4* 1.4*  --  1.9* 2.1*   < > = values in this interval not displayed.        Elevated D-dimer due to intense inflammation.  CTA and leg ultrasound unremarkable was on D-dimer and Lovenox overlap, INR now above 2.  Lovenox stopped.   Atrial fibrillation with RVR/concern of atrial flutter/history of MAT/PSVT -Mali vas 2 score of greater than 2, also has history of previous DVTs. - Patient does have a history of MAT and SVT but no previously documented history of atrial fibrillation. Stabile TSH and echocardiogram, cardiology on board.  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 1 week of 400 twice daily (04/06/20), 2 weeks of 200 twice daily(04/27/20), then 200 mg thereafter.  Continue Coumadin for anticoagulation INR is stable.  History of venous  thromboembolism on warfarin - See discussion above as well.  CT angiogram was negative.  Lower extremity Dopplers negative for DVT.  Was on Lovenox and Coumadin overlap, INR near therapeutic, chronically on Coumadin, does not want to try newer oral anticoagulation agents.  Mild Nose bleed - due to dryness from oxygen, improved once he is wearing a facial mask, saline nasal drops and Afrin spray provided.  This problem has resolved.  Transaminitis - Most likely due to COVID-19. Trend is stable.  Continue to monitor periodically.  Essential hypertension - Blood pressure is reasonably well controlled.  Continue to monitor.  Lower extremity edema - Echocardiogram showed normal systolic function without any significant valvular abnormalities.  Lower extremity Doppler study negative for DVT.  Much improved.  History of bronchial asthma - Stable.  No wheezing appreciated at this time.  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  Finished IV Solu-Medrol and resume home dose prednisone.  Recent acute metabolic encephalopathy - Apparently was confused during his previous hospitalization.  Seems to be at baseline currently, this problem has resolved..  Situational anxiety - Apparently patient sister is quite sick with terminal cancer and is under hospice care at home.  She might pass away soon.  Patient with occasional crying episodes.  Now improved with supportive care.  Follow with PCP.  Morbid obesity - BMI of 46, follow with PCP for weight loss.    Discharge diagnosis     Principal Problem:   Acute hypoxemic respiratory failure due to COVID-19 [facility]) Active Problems:  DVT of leg (deep venous thrombosis) (HCC)   B12 deficiency   Essential hypertension    Discharge instructions    Discharge Instructions    Diet - low sodium heart healthy   Complete by: As directed    Discharge instructions   Complete by:  As directed    Follow with Primary MD [name], [name], MD in 7 days   Get CBC, CMP, INR, 2 view Chest X ray -  checked next visit within 1 week by Primary MD   Activity: As tolerated with Full fall precautions use walker/cane & assistance as needed  Disposition Home   Diet: Heart Healthy    Special Instructions: If you have smoked or chewed Tobacco  in the last 2 yrs please stop smoking, stop any regular Alcohol  and or any Recreational drug use.  On your next visit with your primary care physician please Get Medicines reviewed and adjusted.  Please request your Prim.MD to go over all Hospital Tests and Procedure/Radiological results at the follow up, please get all Hospital records sent to your Prim MD by signing hospital release before you go home.  If you experience worsening of your admission symptoms, develop shortness of breath, life threatening emergency, suicidal or homicidal thoughts you must seek medical attention immediately by calling 911 or calling your MD immediately  if symptoms less severe.  You Must read complete instructions/literature along with all the possible adverse reactions/side effects for all the Medicines you take and that have been prescribed to you. Take any new Medicines after you have completely understood and accpet all the possible adverse reactions/side effects.   Increase activity slowly   Complete by: As directed       Discharge Medications   Allergies as of 04/04/2020      Reactions   Cat Hair Extract Itching   Diltiazem Anaphylaxis   Sulfa Antibiotics Hives, Swelling   Anoro Ellipta [umeclidinium-vilanterol]    Too much mucus   Breo Ellipta [fluticasone Furoate-vilanterol]    Too much mucus   Exalgo [hydromorphone Hcl] Nausea Only   Sick and nauseated   Fentanyl Other (See Comments)   REACTION: too sleepy   Gabapentin    Hydrocodone-acetaminophen Nausea Only   REACTION: nausea   Lopressor [metoprolol Tartrate]    Asthmatic  issues    Penicillins Hives   Sulfonamide Derivatives Hives, Swelling   Tylenol [acetaminophen]    Liver issues      Medication List    TAKE these medications   albuterol (2.5 MG/3ML) 0.083% nebulizer solution Commonly known as: PROVENTIL Take 3 mLs (2.5 mg total) by nebulization every 4 (four) hours as needed for wheezing or shortness of breath. Dx  J45.909   albuterol 108 (90 Base) MCG/ACT inhaler Commonly known as: VENTOLIN HFA Inhale 2 puffs into the lungs every 6 (six) hours as needed for wheezing or shortness of breath.   amiodarone 200 MG tablet Commonly known as: PACERONE 400 twice daily till (04/06/20), then 2 weeks of 200 twice daily, then 200 mg daily thereafter.   benzonatate 100 MG capsule Commonly known as: Tessalon Perles Take 1 capsule (100 mg total) by mouth every 6 (six) hours as needed for cough.   famotidine 20 MG tablet Commonly known as: PEPCID Take 1 tablet (20 mg total) by mouth daily. Start taking on: April 05, 2020   fish oil-omega-3 fatty acids 1000 MG capsule Take 2 g by mouth every morning.   fluticasone 50 MCG/ACT nasal spray Commonly known as: FLONASE  Place 2 sprays into both nostrils daily. What changed:   when to take this  reasons to take this   furosemide 20 MG tablet Commonly known as: LASIX Take 1 tablet (20 mg total) by mouth daily.   glucosamine-chondroitin 500-400 MG tablet Take 1 tablet by mouth every morning.   guaiFENesin-dextromethorphan 100-10 MG/5ML syrup Commonly known as: ROBITUSSIN DM Take 10 mLs by mouth every 4 (four) hours as needed for cough.   nortriptyline 10 MG capsule Commonly known as: PAMELOR Take 1 capsule (10 mg total) by mouth every evening.   predniSONE 5 MG tablet Commonly known as: DELTASONE Take 1 tablet (5 mg total) by mouth daily with breakfast. Continue your previous home dose 5 mg of prednisone daily unchanged as before. What changed:   medication strength  how much to take  how  to take this  when to take this  additional instructions   Vitamin D3 125 MCG (5000 UT) Caps Take 1 capsule (5,000 Units total) by mouth daily.   warfarin 6 MG tablet Commonly known as: COUMADIN Take as directed. If you are unsure how to take this medication, talk to your nurse or doctor. Original instructions: As directed What changed:   how much to take  how to take this  when to take this  additional instructions   warfarin 3 MG tablet Commonly known as: COUMADIN Take as directed. If you are unsure how to take this medication, talk to your nurse or doctor. Original instructions: Take 1 tablet on Mon Wed and Fri or as directed by anticoagulation clinic What changed: Another medication with the same name was changed. Make sure you understand how and when to take each.            Durable Medical Equipment  (From admission, onward)         Start     Ordered   04/03/20 0956  For home use only DME 3 n 1  Once       Comments: Bariatric 3 in 1 commode   04/03/20 0956   04/03/20 0955  For home use only DME 4 wheeled rolling walker with seat  Once       Comments: Bariatric rollator  Question:  Patient needs a walker to treat with the following condition  Answer:  Generalized weakness   04/03/20 0956   03/30/20 1317  For home use only DME oxygen  Once       Question Answer Comment  Length of Need 6 Months   Mode or (Route) Nasal cannula   Liters per Minute 2   Frequency Continuous (stationary and portable oxygen unit needed)   Oxygen conserving device Yes   Oxygen delivery system Gas      03/30/20 1316           Follow-up Information    [facility] Follow up.   Specialty: Home Health Services Why: For home health services Contact information: [address] [place] [address] [place], [facility] Follow up.   Why: [facility] to resume oxygen services. Ordered bariatric 3 in 1 commode and  rolling walker with seat to be delivered to patient room. Contact information: [address] [place] [address] [phone]        Buford Dresser, MD Follow up on 04/27/2020.   Specialty: Cardiology Why: @ 9AM Contact information: [place] [address] [phone]  [name], [name], MD. Schedule an appointment as soon as possible for a visit in 1 week(s).   Specialty: Internal Medicine Contact information: [place] [address] [phone]               Major procedures and Radiology Reports - PLEASE review detailed and final reports thoroughly  -       CT ANGIO CHEST PE W OR WO CONTRAST  Result Date: 03/23/2020 CLINICAL DATA:  Shortness of breath. COVID positive, recent hospitalization post discharge yesterday. EXAM: CT ANGIOGRAPHY CHEST WITH CONTRAST TECHNIQUE: Multidetector CT imaging of the chest was performed using the standard protocol during bolus administration of intravenous contrast. Multiplanar CT image reconstructions and MIPs were obtained to evaluate the vascular anatomy. CONTRAST:  159mL OMNIPAQUE IOHEXOL 350 MG/ML SOLN COMPARISON:  Radiograph earlier today.  Chest CTA 07/04/2013 FINDINGS: Cardiovascular: There are no filling defects within the pulmonary arteries to suggest pulmonary embolus. There is significant breathing motion artifact, evaluation is diagnostic to the proximal segmental level. Cannot assess distal segmental and subsegmental branches. Thoracic aorta is normal in caliber. There is no aortic dissection. Moderate aortic atherosclerosis. Borderline cardiomegaly. There mitral annulus calcifications. Coronary artery calcifications. No pericardial effusion. Small amount of contrast refluxes into the hepatic veins and IVC. Mediastinum/Nodes: Small hiatal hernia. Small paratracheal and right infrahilar nodes not enlarged by size criteria. No suspicious thyroid nodule. No  pneumomediastinum. Lungs/Pleura: Multifocal patchy and geographic ground-glass opacities within all lobes of both lungs. Some of these opacities appear coalescing peripherally. No convincing septal thickening or pulmonary edema. No pneumothorax. Trachea and central bronchi are patent. No pleural fluid. The previous right lower lobe pulmonary nodule is obscured by opacities on the current exam. Upper Abdomen: Minimal contrast refluxing into the hepatic veins and IVC. Suspected hepatic steatosis. Cholecystectomy. Multiple cysts in the left kidney, incompletely characterized on this chest CTA, but grossly stable from 2015. Musculoskeletal: Multilevel degenerative change throughout the spine. There are no acute or suspicious osseous abnormalities. Review of the MIP images confirms the above findings. IMPRESSION: 1. No pulmonary embolus allowing for significant breathing motion artifact. 2. Multifocal patchy and geographic ground-glass opacities within all lobes of both lungs, pattern consistent with COVID-19 pneumonia. 3. Small amount of contrast refluxing into the hepatic veins and IVC, suggesting elevated right heart pressures. 4. Small hiatal hernia. 5. Aortic atherosclerosis and coronary artery calcifications. Aortic Atherosclerosis (ICD10-I70.0). Electronically Signed   By: Keith Rake M.D.   On: 03/23/2020 17:05   DG Chest Port 1 View  Result Date: 03/27/2020 CLINICAL DATA:  COVID-19 pneumonia EXAM: PORTABLE CHEST 1 VIEW COMPARISON:  Chest radiograph and chest CT March 23, 2020 FINDINGS: Widespread patchy airspace opacity remains, slightly more on the left than on the right. No new opacity evident. Heart is enlarged, stable, with pulmonary vascularity normal. No adenopathy. No bone lesions. IMPRESSION: Multifocal airspace opacity consistent with atypical organism pneumonia, stable. Stable cardiac prominence. No adenopathy evident. Electronically Signed   By: Lowella Grip III M.D.   On: 03/27/2020  07:55   DG Chest Portable 1 View  Result Date: 03/23/2020 CLINICAL DATA:  Shortness of breath EXAM: PORTABLE CHEST 1 VIEW COMPARISON:  03/17/2020 FINDINGS: Cardiomegaly. Bilateral indistinct airspace opacities that have progressed. No clear Kerley lines. No pleural effusion or pneumothorax. IMPRESSION: Progressive pulmonary infiltrates. Electronically Signed   By: Monte Fantasia M.D.   On: 03/23/2020 07:18   DG Chest Port 1 View  Result Date: 03/18/2020 CLINICAL DATA:  Worsening shortness of breath.  COVID positive. EXAM: PORTABLE CHEST 1 VIEW COMPARISON:  03/31/2013 FINDINGS: Cardiac enlargement. Patchy peripheral and basilar infiltration with peribronchial thickening compatible with multifocal pneumonia. No pleural effusions. No pneumothorax. Mediastinal contours appear intact. IMPRESSION: Cardiac enlargement. Patchy bilateral pulmonary infiltrates compatible with multifocal pneumonia. Electronically Signed   By: Lucienne Capers M.D.   On: 03/18/2020 00:12   ECHOCARDIOGRAM COMPLETE  Result Date: 03/24/2020    ECHOCARDIOGRAM REPORT   Patient Name:   NICHOLAD KAUTZMAN Date of Exam: 03/24/2020 Medical Rec #:  [id number]         Height:       71.0 in Accession #:    [id number]        Weight:       324.7 lb Date of Birth:  1950/10/11         BSA:          2.591 m Patient Age:    75 years          BP:           140/101 mmHg Patient Gender: M                 HR:           115 bpm. Exam Location:  Inpatient Procedure: 2D Echo, Cardiac Doppler and Color Doppler Indications:     I42.9 Cardiomyopathy (unspecified)  History:         Patient has prior history of Echocardiogram examinations, most                  recent 05/06/2018. Risk Factors:Morbid Obesity. Covid 19                  positive.  Sonographer:     Merrie Roof RDCS Referring Phys:  [place] Diagnosing Phys: Carlyle Dolly MD IMPRESSIONS  1. Left ventricular ejection fraction, by estimation, is 55 to 60%. The left ventricle has normal  function. The left ventricle has no regional wall motion abnormalities. Left ventricular diastolic parameters are indeterminate.  2. Right ventricular systolic function is normal. The right ventricular size is normal.  3. Left atrial size was mild to moderately dilated.  4. The mitral valve is normal in structure. No evidence of mitral valve regurgitation. No evidence of mitral stenosis.  5. The aortic valve has an indeterminant number of cusps. There is moderate calcification of the aortic valve. There is moderate thickening of the aortic valve. Aortic valve regurgitation is not visualized. Mild to moderate aortic valve stenosis.Aortic valve mean gradient measures 19.3 mmHg. Aortic valve peak gradient measures 32.1 mmHg. Aortic valve area, by VTI measures 1.26 cm. FINDINGS  Left Ventricle: Left ventricular ejection fraction, by estimation, is 55 to 60%. The left ventricle has normal function. The left ventricle has no regional wall motion abnormalities. The left ventricular internal cavity size was normal in size. There is  no left ventricular hypertrophy. Left ventricular diastolic parameters are indeterminate. Right Ventricle: The right ventricular size is normal. No increase in right ventricular wall thickness. Right ventricular systolic function is normal. Left Atrium: Left atrial size was mild to moderately dilated. Right Atrium: Right atrial size was normal in size. Pericardium: There is no evidence of pericardial effusion. Mitral Valve: The mitral valve is normal in structure. No evidence of mitral valve regurgitation. No evidence of mitral valve stenosis. Tricuspid Valve: The tricuspid valve is normal in structure. Tricuspid valve regurgitation is not demonstrated. No evidence of tricuspid stenosis. Aortic Valve: The aortic valve  has an indeterminant number of cusps. There is moderate calcification of the aortic valve. There is moderate thickening of the aortic valve. There is moderate aortic valve annular  calcification. Aortic valve regurgitation is not visualized. Mild to moderate aortic stenosis is present. Aortic valve mean gradient measures 19.3 mmHg. Aortic valve peak gradient measures 32.1 mmHg. Aortic valve area, by VTI measures 1.26 cm. Pulmonic Valve: The pulmonic valve was not well visualized. Pulmonic valve regurgitation is not visualized. No evidence of pulmonic stenosis. Aorta: The aortic root is normal in size and structure. Pulmonary Artery: Indeterminant PASP, inadequate TR jet. IAS/Shunts: The interatrial septum was not well visualized.  LEFT VENTRICLE PLAX 2D LVIDd:         4.70 cm LVIDs:         3.60 cm LV PW:         1.00 cm LV IVS:        0.90 cm LVOT diam:     2.10 cm LV SV:         64 LV SV Index:   25 LVOT Area:     3.46 cm  LV Volumes (MOD) LV vol d, MOD A4C: 86.8 ml LV vol s, MOD A4C: 46.1 ml LV SV MOD A4C:     86.8 ml RIGHT VENTRICLE RV Basal diam:  4.10 cm RV Mid diam:    3.40 cm RV S prime:     14.70 cm/s TAPSE (M-mode): 1.8 cm LEFT ATRIUM            Index       RIGHT ATRIUM           Index LA diam:      4.90 cm  1.89 cm/m  RA Area:     23.50 cm LA Vol (A2C): 103.0 ml 39.75 ml/m RA Volume:   60.10 ml  23.20 ml/m LA Vol (A4C): 82.2 ml  31.73 ml/m  AORTIC VALVE AV Area (Vmax):    1.34 cm AV Area (Vmean):   1.21 cm AV Area (VTI):     1.26 cm AV Vmax:           283.33 cm/s AV Vmean:          208.667 cm/s AV VTI:            0.505 m AV Peak Grad:      32.1 mmHg AV Mean Grad:      19.3 mmHg LVOT Vmax:         110.00 cm/s LVOT Vmean:        73.100 cm/s LVOT VTI:          0.184 m LVOT/AV VTI ratio: 0.36  AORTA Ao Root diam: 3.80 cm  SHUNTS Systemic VTI:  0.18 m Systemic Diam: 2.10 cm Carlyle Dolly MD Electronically signed by Carlyle Dolly MD Signature Date/Time: 03/24/2020/3:47:49 PM    Final (Updated)    VAS [place] LOWER EXTREMITY VENOUS (DVT)  Result Date: 03/25/2020  Lower Venous DVTStudy Indications: Swelling, and eleavted d-dimer.  Risk Factors: History of extensive DVT RT in  2003. Anticoagulation: Coumadin. Limitations: Body habitus. Comparison Study: 05-07-2013 Prior RT lower venous available. Performing Technologist: Darlin Coco  Examination Guidelines: A complete evaluation includes B-mode imaging, spectral Doppler, color Doppler, and power Doppler as needed of all accessible portions of each vessel. Bilateral testing is considered an integral part of a complete examination. Limited examinations for reoccurring indications may be performed as noted. The reflux portion of the exam is performed with the patient  in reverse Trendelenburg.  +---------+---------------+---------+-----------+----------+----------------+ RIGHT    CompressibilityPhasicitySpontaneityPropertiesThrombus Aging   +---------+---------------+---------+-----------+----------+----------------+ CFV      Full           Yes      Yes                                   +---------+---------------+---------+-----------+----------+----------------+ SFJ      Full                                                          +---------+---------------+---------+-----------+----------+----------------+ FV Prox  Full                                         Fibrin stranding +---------+---------------+---------+-----------+----------+----------------+ FV Mid   Full                                                          +---------+---------------+---------+-----------+----------+----------------+ FV DistalFull                                                          +---------+---------------+---------+-----------+----------+----------------+ PFV      Full                                                          +---------+---------------+---------+-----------+----------+----------------+ POP      Full           Yes      Yes                  Fibrin stranding +---------+---------------+---------+-----------+----------+----------------+ PTV      Full                                                           +---------+---------------+---------+-----------+----------+----------------+ PERO                                                  Not visualized   +---------+---------------+---------+-----------+----------+----------------+ Gastroc  Full                                                          +---------+---------------+---------+-----------+----------+----------------+   +---------+---------------+---------+-----------+----------+--------------+  LEFT     CompressibilityPhasicitySpontaneityPropertiesThrombus Aging +---------+---------------+---------+-----------+----------+--------------+ CFV      Full           Yes      Yes                                 +---------+---------------+---------+-----------+----------+--------------+ SFJ      Full                                                        +---------+---------------+---------+-----------+----------+--------------+ FV Prox  Full                                                        +---------+---------------+---------+-----------+----------+--------------+ FV Mid   Full                                                        +---------+---------------+---------+-----------+----------+--------------+ FV DistalFull                                                        +---------+---------------+---------+-----------+----------+--------------+ PFV      Full                                                        +---------+---------------+---------+-----------+----------+--------------+ POP      Full           Yes      Yes                                 +---------+---------------+---------+-----------+----------+--------------+ PTV      Full                                                        +---------+---------------+---------+-----------+----------+--------------+ PERO     Full                                                         +---------+---------------+---------+-----------+----------+--------------+     Summary: RIGHT: - There is no evidence of deep vein thrombosis in the lower extremity. However, portions of this examination were limited- see technologist comments above.  - No cystic structure found in the popliteal fossa.  LEFT: - There is no evidence  of deep vein thrombosis in the lower extremity.  - No cystic structure found in the popliteal fossa.  *See table(s) above for measurements and observations. Electronically signed by Harold Barban MD on 03/25/2020 at 7:42:06 PM.    Final     Micro Results     No results found for this or any previous visit (from the past 240 hour(s)).  Today   Subjective    Robert [name] today has no headache,no chest abdominal pain,no new weakness tingling or numbness, feels much better wants to go home today.     Objective   Blood pressure 112/89, pulse 88, temperature 98.2 F (36.8 C), temperature source Axillary, resp. rate (!) 21, height 5\' 11"  (1.803 m), weight (!) 138.8 kg, SpO2 91 %.   Intake/Output Summary (Last 24 hours) at 04/04/2020 1010 Last data filed at 04/04/2020 0846 Gross per 24 hour  Intake 960 ml  Output 2430 ml  Net -1470 ml    Exam  Awake Alert, No new F.N deficits, Normal affect Java.AT,PERRAL Supple Neck,No JVD, No cervical lymphadenopathy appriciated.  Symmetrical Chest wall movement, Good air movement bilaterally, CTAB RRR,No Gallops,Rubs or new Murmurs, No Parasternal Heave +ve B.Sounds, Abd Soft, Non tender, No organomegaly appriciated, No rebound -guarding or rigidity. No Cyanosis, Clubbing or edema, No new Rash or bruise   Data Review   CBC w Diff:  Lab Results  Component Value Date   WBC 15.8 (H) 04/04/2020   HGB 14.7 04/04/2020   HCT 42.7 04/04/2020   PLT 263 04/04/2020   LYMPHOPCT 5 04/04/2020   MONOPCT 6 04/04/2020   EOSPCT 0 04/04/2020   BASOPCT 0 04/04/2020    CMP:  Lab Results  Component Value Date   NA 135  04/04/2020   NA 138 10/28/2018   K 4.7 04/04/2020   CL 100 04/04/2020   CO2 25 04/04/2020   BUN 25 (H) 04/04/2020   BUN 15 10/28/2018   CREATININE 0.98 04/04/2020   PROT 6.0 (L) 04/04/2020   ALBUMIN 2.4 (L) 04/04/2020   BILITOT 1.0 04/04/2020   ALKPHOS 58 04/04/2020   AST 25 04/04/2020   ALT 77 (H) 04/04/2020  . Lab Results  Component Value Date   INR 2.1 (H) 04/04/2020   INR 1.9 (H) 04/03/2020   INR 1.4 (H) 04/02/2020   PROTIME 18.2 12/01/2008     Total Time in preparing paper work, data evaluation and todays exam - 71 minutes  Lala Lund M.D on 04/04/2020 at 10:10 AM  [facility]   Office  [phone]

## 2020-04-04 NOTE — Care Management (Signed)
Received call from [facility] that wife had called office to let know that patient had arrived home and that they had no home oxygen.  Case manager called Thedore Mins, [employer] about oxygen concerns, Thedore Mins said he would check into it and call me back.  CM called wife on phone and asked about patient if was okay, wife said yes but that he was worn out from trip.  Discussed with wife that if during time without oxygen that patient starts to have any struggles to immediately seek care.  Wife said that while patient was in hospital that [employer] was supposed to change out the oxygen concentrator.  Called [employer] about this, this is what we found out.  Wife had called company to come get equipment and thought that patient would come out with new equipment.  [employer], Zach, and CM together worked to get oxygen sent back out to patient

## 2020-04-05 ENCOUNTER — Other Ambulatory Visit: Payer: Self-pay | Admitting: *Deleted

## 2020-04-05 ENCOUNTER — Telehealth: Payer: Self-pay | Admitting: Internal Medicine

## 2020-04-05 DIAGNOSIS — I872 Venous insufficiency (chronic) (peripheral): Secondary | ICD-10-CM | POA: Diagnosis not present

## 2020-04-05 DIAGNOSIS — J9600 Acute respiratory failure, unspecified whether with hypoxia or hypercapnia: Secondary | ICD-10-CM | POA: Diagnosis not present

## 2020-04-05 DIAGNOSIS — I4891 Unspecified atrial fibrillation: Secondary | ICD-10-CM | POA: Diagnosis not present

## 2020-04-05 DIAGNOSIS — M47896 Other spondylosis, lumbar region: Secondary | ICD-10-CM | POA: Diagnosis not present

## 2020-04-05 DIAGNOSIS — R911 Solitary pulmonary nodule: Secondary | ICD-10-CM | POA: Diagnosis not present

## 2020-04-05 DIAGNOSIS — Z7901 Long term (current) use of anticoagulants: Secondary | ICD-10-CM | POA: Diagnosis not present

## 2020-04-05 DIAGNOSIS — Z9981 Dependence on supplemental oxygen: Secondary | ICD-10-CM | POA: Diagnosis not present

## 2020-04-05 DIAGNOSIS — M6281 Muscle weakness (generalized): Secondary | ICD-10-CM | POA: Diagnosis not present

## 2020-04-05 DIAGNOSIS — Z5181 Encounter for therapeutic drug level monitoring: Secondary | ICD-10-CM | POA: Diagnosis not present

## 2020-04-05 DIAGNOSIS — Z86718 Personal history of other venous thrombosis and embolism: Secondary | ICD-10-CM | POA: Diagnosis not present

## 2020-04-05 DIAGNOSIS — J1282 Pneumonia due to coronavirus disease 2019: Secondary | ICD-10-CM | POA: Diagnosis not present

## 2020-04-05 DIAGNOSIS — I10 Essential (primary) hypertension: Secondary | ICD-10-CM | POA: Diagnosis not present

## 2020-04-05 DIAGNOSIS — U071 COVID-19: Secondary | ICD-10-CM | POA: Diagnosis not present

## 2020-04-05 NOTE — Patient Outreach (Signed)
[facility]) Care Management  04/05/2020  Robert [name] 05/07/1951 [id number]   Referral received from hospital liaison as member was recently discharged after being admitted for Covid complications.  Call placed to member for assessment, no answer. Unable to leave message, will send unsuccessful outreach letter and follow up within the next 3-4 business days.  [name], [place], MSN [place] [phone]

## 2020-04-05 NOTE — Telephone Encounter (Signed)
     [name] from [facility] requesting verbal order for home PT 2x week for 3 1x week for 1  Phone [phone], ok to leave message

## 2020-04-06 ENCOUNTER — Telehealth: Payer: Self-pay | Admitting: *Deleted

## 2020-04-06 DIAGNOSIS — I872 Venous insufficiency (chronic) (peripheral): Secondary | ICD-10-CM

## 2020-04-06 NOTE — Telephone Encounter (Addendum)
° °  [name] from [facility] requesting order be faxed Please fax order to [facility], ATTN: Rennie Natter [phone]   Also, [name] states patients leg is weeping, requesting verbal order for RN nurse eval Please [name]

## 2020-04-06 NOTE — Telephone Encounter (Signed)
Called Robert [name] no answer LMOM RTC.Marland KitchenJohny [name]

## 2020-04-06 NOTE — Telephone Encounter (Signed)
Rec'd call from PT [name]) she states both of the pt legs are swollen, snd wife is wantig to see if he could get a order for pneumatic compression sleeve for his legs to help with the swelling. He is not able to elevated his leg properly due to his back. Pt was D/C 04/04/20, and have a hosp when PCP returns.Robert KitchenJohny Chess

## 2020-04-06 NOTE — Telephone Encounter (Signed)
Please give verbal orders for RN evaluation as requested by PT for evaluation of weeping leg;  I will put order on your desk for pneumatic compression sleeve;

## 2020-04-06 NOTE — Telephone Encounter (Signed)
Can you give a verbal or do we have to write out the orders and print them? Okay to give as requested;

## 2020-04-06 NOTE — Telephone Encounter (Signed)
Called Robert [name] there was no answer LMOM ok verbal for PT.../lmb

## 2020-04-09 ENCOUNTER — Telehealth: Payer: Self-pay | Admitting: Internal Medicine

## 2020-04-09 ENCOUNTER — Other Ambulatory Visit: Payer: Self-pay | Admitting: Internal Medicine

## 2020-04-09 ENCOUNTER — Telehealth: Payer: Self-pay | Admitting: General Practice

## 2020-04-09 DIAGNOSIS — I872 Venous insufficiency (chronic) (peripheral): Secondary | ICD-10-CM | POA: Diagnosis not present

## 2020-04-09 DIAGNOSIS — Z7901 Long term (current) use of anticoagulants: Secondary | ICD-10-CM

## 2020-04-09 DIAGNOSIS — R911 Solitary pulmonary nodule: Secondary | ICD-10-CM | POA: Diagnosis not present

## 2020-04-09 DIAGNOSIS — J1282 Pneumonia due to coronavirus disease 2019: Secondary | ICD-10-CM | POA: Diagnosis not present

## 2020-04-09 DIAGNOSIS — U071 COVID-19: Secondary | ICD-10-CM | POA: Diagnosis not present

## 2020-04-09 DIAGNOSIS — I4891 Unspecified atrial fibrillation: Secondary | ICD-10-CM | POA: Diagnosis not present

## 2020-04-09 NOTE — Telephone Encounter (Signed)
    Will from [facility] requesting verbal order for OT 2X2 Phone [phone], ok to leave a message

## 2020-04-09 NOTE — Telephone Encounter (Signed)
Called Robert [name] there was no answer LMOM order has been faxed , and gave verbal for RN evaluation.Marland KitchenJohny Chess

## 2020-04-09 NOTE — Telephone Encounter (Signed)
Attempted to return call to patient but no answer on home # or Mobile # and no VM set up for either # so could not leave message.

## 2020-04-10 ENCOUNTER — Encounter: Payer: Self-pay | Admitting: *Deleted

## 2020-04-10 ENCOUNTER — Telehealth: Payer: Self-pay | Admitting: General Practice

## 2020-04-10 ENCOUNTER — Other Ambulatory Visit: Payer: Self-pay | Admitting: *Deleted

## 2020-04-10 ENCOUNTER — Ambulatory Visit: Payer: MEDICARE

## 2020-04-10 DIAGNOSIS — J1282 Pneumonia due to coronavirus disease 2019: Secondary | ICD-10-CM | POA: Diagnosis not present

## 2020-04-10 DIAGNOSIS — I4891 Unspecified atrial fibrillation: Secondary | ICD-10-CM | POA: Diagnosis not present

## 2020-04-10 DIAGNOSIS — I872 Venous insufficiency (chronic) (peripheral): Secondary | ICD-10-CM | POA: Diagnosis not present

## 2020-04-10 DIAGNOSIS — U071 COVID-19: Secondary | ICD-10-CM | POA: Diagnosis not present

## 2020-04-10 DIAGNOSIS — R911 Solitary pulmonary nodule: Secondary | ICD-10-CM | POA: Diagnosis not present

## 2020-04-10 NOTE — Telephone Encounter (Signed)
Ok OT Thx

## 2020-04-10 NOTE — Telephone Encounter (Signed)
Order faxed to [facility] [place] to check patient's pt/inr on Wednesday, 10/20 and report to Villa Herb, RN @ [phone].

## 2020-04-10 NOTE — Patient Outreach (Signed)
[facility]) Care Management  04/10/2020  TAAVI HOOSE [birth year] [id number]   Referral Date: 10/13 Referral Source: Hospital liaison Referral Reason: Post hospital discharge/Covid infection Insurance: Medicare   Outreach attempt #2, successful.  Identity verified.  This care manager introduced self and stated purpose of call.  [facility] care management services explained.    Social: Lives with spouse who is supportive and has a medical background.  Also has sons who have been staying with him while his wife is working.  Sons and daughter in laws all live within 5 minutes.  Report he was discharged on oxygen, not having to use as much now as he did when first discharged.  Does complain of lower extremity swelling,foot pumps were ordered through home health, waiting on the arrival of machine.  Home health making home visits for PT, OT, and nursing.  Conditions: Per chart, has history of DVT, SVT,A-fib, HTN, chronic venous insufficiency, osteoarthritis, dyslipidemia, and lymphadenopathy.  Medications: Reviewed with member, state his wife helps with medication management.  Denies need for financial assistance.  Appointments: Office visits scheduled with cardiology on 11/5 and with PCP on 11/11.  Denies need for transportation resources.  Denies any urgent concerns, encouraged to contact this care manager with questions.  Plan: RN CM will send EMMI education regarding Pneumonia and Covid recovery.  Will follow up within the next 2 weeks.  Goals Addressed            This Visit's Progress   . [facility] - Make and Keep All Appointments       Follow Up Date 11/15   - ask family or friend for a ride - call to cancel if needed - keep a calendar with appointment dates    Why is this important?   Part of staying healthy is seeing the doctor for follow-up care.  If you forget your appointments, there are some things you can do to stay on track.    Notes:     . [facility] - Will  not be readmitted within the next 31 days       Reviewed discharge AVS with patient Educated on importance of active involvement with home health      [name], Therapist, sports, MSN [place] [phone]

## 2020-04-10 NOTE — Telephone Encounter (Signed)
Called Will there was no answer LMOM w/verbal for OT.Marland KitchenJohny Chess

## 2020-04-11 ENCOUNTER — Ambulatory Visit (INDEPENDENT_AMBULATORY_CARE_PROVIDER_SITE_OTHER): Payer: MEDICARE | Admitting: General Practice

## 2020-04-11 ENCOUNTER — Telehealth: Payer: Self-pay | Admitting: Internal Medicine

## 2020-04-11 DIAGNOSIS — Z86711 Personal history of pulmonary embolism: Secondary | ICD-10-CM

## 2020-04-11 DIAGNOSIS — Z7901 Long term (current) use of anticoagulants: Secondary | ICD-10-CM

## 2020-04-11 DIAGNOSIS — J1282 Pneumonia due to coronavirus disease 2019: Secondary | ICD-10-CM | POA: Diagnosis not present

## 2020-04-11 DIAGNOSIS — I4891 Unspecified atrial fibrillation: Secondary | ICD-10-CM | POA: Diagnosis not present

## 2020-04-11 DIAGNOSIS — R911 Solitary pulmonary nodule: Secondary | ICD-10-CM | POA: Diagnosis not present

## 2020-04-11 DIAGNOSIS — U071 COVID-19: Secondary | ICD-10-CM | POA: Diagnosis not present

## 2020-04-11 DIAGNOSIS — I872 Venous insufficiency (chronic) (peripheral): Secondary | ICD-10-CM | POA: Diagnosis not present

## 2020-04-11 LAB — POCT INR: INR: 6.3 — AB (ref 2.0–3.0)

## 2020-04-11 NOTE — Telephone Encounter (Signed)
    Spouse calling to request order for labs be faxed to [facility] to be drawn today when nurse comes to check INR Patient recently discharged from the hospital,spouse states discharge summary mentioned having CBC  Please advise

## 2020-04-11 NOTE — Patient Instructions (Signed)
Pre visit review using our clinic review tool, if applicable. No additional management support is needed unless otherwise documented below in the visit note.  Hold coumadin through Saturday.  Take 6 mg on Sunday and re-check on Monday.  Dosing instructions given to [place], [place] @ [facility].

## 2020-04-12 ENCOUNTER — Ambulatory Visit: Payer: MEDICARE

## 2020-04-12 DIAGNOSIS — J1282 Pneumonia due to coronavirus disease 2019: Secondary | ICD-10-CM | POA: Diagnosis not present

## 2020-04-12 DIAGNOSIS — R911 Solitary pulmonary nodule: Secondary | ICD-10-CM | POA: Diagnosis not present

## 2020-04-12 DIAGNOSIS — I872 Venous insufficiency (chronic) (peripheral): Secondary | ICD-10-CM | POA: Diagnosis not present

## 2020-04-12 DIAGNOSIS — U071 COVID-19: Secondary | ICD-10-CM | POA: Diagnosis not present

## 2020-04-12 DIAGNOSIS — I4891 Unspecified atrial fibrillation: Secondary | ICD-10-CM | POA: Diagnosis not present

## 2020-04-12 NOTE — Telephone Encounter (Signed)
OK CBC, CMET Thx

## 2020-04-13 NOTE — Telephone Encounter (Signed)
What Dx to use for orders? Also pt wife states they are having hard time trying to fine the pneumatic sleeves for his legs. She states his legs/feet are so swollen that their whoozing fluid. They had tried him in a unna boot but whatever inside the boot broke him out. Want to know what else she can do

## 2020-04-14 NOTE — Telephone Encounter (Signed)
Can his home care agency to provide him with the sequential compression device?  Do I need to write an order for a medical supply store? For now they can buy and memory foam wedge for leg elevation on [place].  It is an inexpensive. Thanks

## 2020-04-16 ENCOUNTER — Telehealth: Payer: Self-pay | Admitting: Internal Medicine

## 2020-04-16 ENCOUNTER — Ambulatory Visit (INDEPENDENT_AMBULATORY_CARE_PROVIDER_SITE_OTHER): Payer: MEDICARE | Admitting: General Practice

## 2020-04-16 DIAGNOSIS — Z7901 Long term (current) use of anticoagulants: Secondary | ICD-10-CM | POA: Diagnosis not present

## 2020-04-16 DIAGNOSIS — J1282 Pneumonia due to coronavirus disease 2019: Secondary | ICD-10-CM | POA: Diagnosis not present

## 2020-04-16 DIAGNOSIS — I872 Venous insufficiency (chronic) (peripheral): Secondary | ICD-10-CM | POA: Diagnosis not present

## 2020-04-16 DIAGNOSIS — I4891 Unspecified atrial fibrillation: Secondary | ICD-10-CM | POA: Diagnosis not present

## 2020-04-16 DIAGNOSIS — R911 Solitary pulmonary nodule: Secondary | ICD-10-CM | POA: Diagnosis not present

## 2020-04-16 DIAGNOSIS — U071 COVID-19: Secondary | ICD-10-CM | POA: Diagnosis not present

## 2020-04-16 LAB — POCT INR: INR: 2.1 (ref 2.0–3.0)

## 2020-04-16 NOTE — Telephone Encounter (Signed)
[name] from [facility] calling with questions about the labs that need to be drawn. Can reach her at [phone]

## 2020-04-16 NOTE — Telephone Encounter (Signed)
Villa Herb, RN gave Olivia Mackie, RN @ [facility] a VO for a CBC and CMET per Dr. Merryl Hacker.  See telephone note from 10/20.

## 2020-04-16 NOTE — Patient Instructions (Addendum)
Pre visit review using our clinic review tool, if applicable. No additional management support is needed unless otherwise documented below in the visit note.  Continue to take 6 mg daily except 3 mg on Mon Wed and Fridays.    Dosing instructions given to Norm Parcel @ [facility].  [phone].  Re-check on Thursday.  VO given to [place], RN @ [facility] to draw CBC and CMET per Dr. Alain Marion.  See telephone note on 10/20.

## 2020-04-16 NOTE — Telephone Encounter (Signed)
Prescription was already give to his home agency they are the ones who can't find. Wife states they have checked medical supply store which they did not have as well.Marland KitchenJohny [name]

## 2020-04-17 ENCOUNTER — Encounter: Payer: Self-pay | Admitting: Internal Medicine

## 2020-04-17 DIAGNOSIS — I872 Venous insufficiency (chronic) (peripheral): Secondary | ICD-10-CM | POA: Diagnosis not present

## 2020-04-17 DIAGNOSIS — I4891 Unspecified atrial fibrillation: Secondary | ICD-10-CM | POA: Diagnosis not present

## 2020-04-17 DIAGNOSIS — U071 COVID-19: Secondary | ICD-10-CM | POA: Diagnosis not present

## 2020-04-17 DIAGNOSIS — R911 Solitary pulmonary nodule: Secondary | ICD-10-CM | POA: Diagnosis not present

## 2020-04-17 DIAGNOSIS — R059 Cough, unspecified: Secondary | ICD-10-CM | POA: Diagnosis not present

## 2020-04-17 DIAGNOSIS — R0989 Other specified symptoms and signs involving the circulatory and respiratory systems: Secondary | ICD-10-CM | POA: Diagnosis not present

## 2020-04-17 DIAGNOSIS — J1282 Pneumonia due to coronavirus disease 2019: Secondary | ICD-10-CM | POA: Diagnosis not present

## 2020-04-17 NOTE — Telephone Encounter (Signed)
Verbal order given to Olivia Mackie, RN @ [facility] for a Mobile 2 view chest X-ray per Dr. Alain Marion.  Olivia Mackie verbalized understanding.

## 2020-04-17 NOTE — Telephone Encounter (Signed)
Robert [name], You know what agency provides sequential compression devices? Thanks

## 2020-04-17 NOTE — Telephone Encounter (Signed)
-----   Message from Cassandria Anger, MD sent at 04/16/2020 11:35 PM EDT ----- Regarding: RE: 2 view chest x-ray Okay.  Thanks, AP ----- Message ----- From: Warden Fillers, RN Sent: 04/16/2020   3:52 PM EDT To: Cassandria Anger, MD Subject: 2 view chest x-ray                             Dr. Alain Marion,  Hospital discharge orders are to get a 2 view chest x-ray in 7 to 10 days.  OK to give Home health RN a verbal order for a mobile 2 view chest x-ray?   Please advise.  Villa Herb, RN

## 2020-04-17 NOTE — Telephone Encounter (Signed)
Okay.  Thanks.

## 2020-04-19 ENCOUNTER — Ambulatory Visit (INDEPENDENT_AMBULATORY_CARE_PROVIDER_SITE_OTHER): Payer: MEDICARE | Admitting: General Practice

## 2020-04-19 ENCOUNTER — Encounter: Payer: Self-pay | Admitting: Internal Medicine

## 2020-04-19 DIAGNOSIS — R911 Solitary pulmonary nodule: Secondary | ICD-10-CM | POA: Diagnosis not present

## 2020-04-19 DIAGNOSIS — R799 Abnormal finding of blood chemistry, unspecified: Secondary | ICD-10-CM | POA: Diagnosis not present

## 2020-04-19 DIAGNOSIS — J1282 Pneumonia due to coronavirus disease 2019: Secondary | ICD-10-CM | POA: Diagnosis not present

## 2020-04-19 DIAGNOSIS — Z13228 Encounter for screening for other metabolic disorders: Secondary | ICD-10-CM | POA: Diagnosis not present

## 2020-04-19 DIAGNOSIS — I872 Venous insufficiency (chronic) (peripheral): Secondary | ICD-10-CM | POA: Diagnosis not present

## 2020-04-19 DIAGNOSIS — Z7901 Long term (current) use of anticoagulants: Secondary | ICD-10-CM | POA: Diagnosis not present

## 2020-04-19 DIAGNOSIS — I4891 Unspecified atrial fibrillation: Secondary | ICD-10-CM | POA: Diagnosis not present

## 2020-04-19 DIAGNOSIS — U071 COVID-19: Secondary | ICD-10-CM | POA: Diagnosis not present

## 2020-04-19 LAB — POCT INR: INR: 1.6 — AB (ref 2.0–3.0)

## 2020-04-19 NOTE — Patient Instructions (Signed)
Pre visit review using our clinic review tool, if applicable. No additional management support is needed unless otherwise documented below in the visit note.  Take 9 mg today and then continue to take 6 mg daily except 3 mg on Mon Wed and Fridays.    Dosing instructions given to Robert [name] @ [facility].  [phone].  Re-check on in 1 week.

## 2020-04-20 ENCOUNTER — Telehealth: Payer: Self-pay | Admitting: Internal Medicine

## 2020-04-20 DIAGNOSIS — M47896 Other spondylosis, lumbar region: Secondary | ICD-10-CM | POA: Diagnosis not present

## 2020-04-20 DIAGNOSIS — U071 COVID-19: Secondary | ICD-10-CM | POA: Diagnosis not present

## 2020-04-20 DIAGNOSIS — Z9981 Dependence on supplemental oxygen: Secondary | ICD-10-CM | POA: Diagnosis not present

## 2020-04-20 DIAGNOSIS — I4891 Unspecified atrial fibrillation: Secondary | ICD-10-CM | POA: Diagnosis not present

## 2020-04-20 DIAGNOSIS — Z7901 Long term (current) use of anticoagulants: Secondary | ICD-10-CM | POA: Diagnosis not present

## 2020-04-20 DIAGNOSIS — J1282 Pneumonia due to coronavirus disease 2019: Secondary | ICD-10-CM | POA: Diagnosis not present

## 2020-04-20 DIAGNOSIS — R911 Solitary pulmonary nodule: Secondary | ICD-10-CM | POA: Diagnosis not present

## 2020-04-20 DIAGNOSIS — I10 Essential (primary) hypertension: Secondary | ICD-10-CM | POA: Diagnosis not present

## 2020-04-20 DIAGNOSIS — M6281 Muscle weakness (generalized): Secondary | ICD-10-CM | POA: Diagnosis not present

## 2020-04-20 DIAGNOSIS — I872 Venous insufficiency (chronic) (peripheral): Secondary | ICD-10-CM | POA: Diagnosis not present

## 2020-04-20 DIAGNOSIS — Z5181 Encounter for therapeutic drug level monitoring: Secondary | ICD-10-CM | POA: Diagnosis not present

## 2020-04-20 DIAGNOSIS — Z86718 Personal history of other venous thrombosis and embolism: Secondary | ICD-10-CM

## 2020-04-20 NOTE — Telephone Encounter (Signed)
Called Linus Orn back to get more information. She states she saw the pt yesterday and saw the area. The area is just red.. no rash, no spots, or anything. Wife called today stating the area is spreading. There concerns is that he had cellulitis before. Inform nrse pt will need to make an appt. Per chart he has appt on 11/11, but will call wife to try and move up appt.Marland KitchenJohny Chess

## 2020-04-20 NOTE — Telephone Encounter (Signed)
    [facility] calling to report patient has a "red area" on shin, starting to spread  Tracey at [facility] [phone]

## 2020-04-23 ENCOUNTER — Telehealth: Payer: Self-pay | Admitting: Cardiology

## 2020-04-23 ENCOUNTER — Telehealth: Payer: Self-pay | Admitting: Physician Assistant

## 2020-04-23 DIAGNOSIS — U071 COVID-19: Secondary | ICD-10-CM | POA: Diagnosis not present

## 2020-04-23 DIAGNOSIS — R911 Solitary pulmonary nodule: Secondary | ICD-10-CM | POA: Diagnosis not present

## 2020-04-23 DIAGNOSIS — I872 Venous insufficiency (chronic) (peripheral): Secondary | ICD-10-CM | POA: Diagnosis not present

## 2020-04-23 DIAGNOSIS — I4891 Unspecified atrial fibrillation: Secondary | ICD-10-CM | POA: Diagnosis not present

## 2020-04-23 DIAGNOSIS — J1282 Pneumonia due to coronavirus disease 2019: Secondary | ICD-10-CM | POA: Diagnosis not present

## 2020-04-23 MED ORDER — DOXYCYCLINE HYCLATE 100 MG PO TABS: 100.0000 mg | ORAL_TABLET | Freq: Two times a day (BID) | ORAL | 0 refills | Status: DC

## 2020-04-23 NOTE — Telephone Encounter (Signed)
Please start doxycycline.  Thanks

## 2020-04-23 NOTE — Telephone Encounter (Signed)
New message:     Patient calling to see if he can do VV for his apt. Patient recovering from covid patient has bee in the hospital for a month.

## 2020-04-23 NOTE — Telephone Encounter (Signed)
  Patient Consent for Virtual Visit   I am acting in triage role today to assist with patient callbacks. Patient requesting change to VV so I obtained consent.       Robert [name] has provided verbal consent on 04/23/2020 for a virtual visit (video or telephone).   CONSENT FOR VIRTUAL VISIT FOR:  Robert [name]  By participating in this virtual visit I agree to the following:  I hereby voluntarily request, consent and authorize [place] and its employed or contracted physicians, Engineer, materials, nurse practitioners or other licensed health care professionals (the Practitioner), to provide me with telemedicine health care services (the "Services") as deemed necessary by the treating Practitioner. I acknowledge and consent to receive the Services by the Practitioner via telemedicine. I understand that the telemedicine visit will involve communicating with the Practitioner through live audiovisual communication technology and the disclosure of certain medical information by electronic transmission. I acknowledge that I have been given the opportunity to request an in-person assessment or other available alternative prior to the telemedicine visit and am voluntarily participating in the telemedicine visit.  I understand that I have the right to withhold or withdraw my consent to the use of telemedicine in the course of my care at any time, without affecting my right to future care or treatment, and that the Practitioner or I may terminate the telemedicine visit at any time. I understand that I have the right to inspect all information obtained and/or recorded in the course of the telemedicine visit and may receive copies of available information for a reasonable fee.  I understand that some of the potential risks of receiving the Services via telemedicine include:  [name] Delay or interruption in medical evaluation due to technological equipment failure or disruption; . Information  transmitted may not be sufficient (e.g. poor resolution of images) to allow for appropriate medical decision making by the Practitioner; and/or  . In rare instances, security protocols could fail, causing a breach of personal health information.  Furthermore, I acknowledge that it is my responsibility to provide information about my medical history, conditions and care that is complete and accurate to the best of my ability. I acknowledge that Practitioner's advice, recommendations, and/or decision may be based on factors not within their control, such as incomplete or inaccurate data provided by me or distortions of diagnostic images or specimens that may result from electronic transmissions. I understand that the practice of medicine is not an exact science and that Practitioner makes no warranties or guarantees regarding treatment outcomes. I acknowledge that a copy of this consent can be made available to me via my patient portal ([place]), or I can request a printed copy by calling the office of [place].    I understand that my insurance will be billed for this visit.   I have read or had this consent read to me. . I understand the contents of this consent, which adequately explains the benefits and risks of the Services being provided via telemedicine.  . I have been provided ample opportunity to ask questions regarding this consent and the Services and have had my questions answered to my satisfaction. . I give my informed consent for the services to be provided through the use of telemedicine in my medical care

## 2020-04-23 NOTE — Telephone Encounter (Signed)
Called pt inform him MD sent over doxycycline to start taking and to keep appt scheduled for Nov 11th

## 2020-04-23 NOTE — Telephone Encounter (Addendum)
I am acting in triage role today to assist with patient callbacks.   Spoke with patient. He is still recovering from Covid and requests virtual visit for his 11/5 follow-up instead of in-person visit. He was admitted in September and October with complications from Covid. Last admission he was noted to have atrial fibrillation, started on amiodarone which he states he's been tolerating without adverse effect. He reports generalized HR rate control except does increase with activity in the context of deconditioning. Otherwise no acute cardiac concerns. He does have capability to check BP and HR at home therefore this seems reasonable to accomodate the switch. If needed, Zio can be utilized to assess HR control in the ambulatory setting. He also has a smartphone and is willing to try video visit. Encouraged him to download MyChart app for ease of use during virtual visit. Notified him of checking vitals day of appt, having pen/paper ready and being prepared for a call 15 minutes beforehand. Consent obtained in separate note. The patient verbalized understanding and gratitude.  Will route to Dr. Harrell Gave just as Juluis Rainier.  Yordi Krager PA-C

## 2020-04-24 ENCOUNTER — Ambulatory Visit (INDEPENDENT_AMBULATORY_CARE_PROVIDER_SITE_OTHER): Payer: MEDICARE | Admitting: General Practice

## 2020-04-24 ENCOUNTER — Other Ambulatory Visit: Payer: Self-pay | Admitting: *Deleted

## 2020-04-24 DIAGNOSIS — R911 Solitary pulmonary nodule: Secondary | ICD-10-CM | POA: Diagnosis not present

## 2020-04-24 DIAGNOSIS — I872 Venous insufficiency (chronic) (peripheral): Secondary | ICD-10-CM | POA: Diagnosis not present

## 2020-04-24 DIAGNOSIS — Z7901 Long term (current) use of anticoagulants: Secondary | ICD-10-CM

## 2020-04-24 DIAGNOSIS — U071 COVID-19: Secondary | ICD-10-CM | POA: Diagnosis not present

## 2020-04-24 DIAGNOSIS — I4891 Unspecified atrial fibrillation: Secondary | ICD-10-CM | POA: Diagnosis not present

## 2020-04-24 DIAGNOSIS — J1282 Pneumonia due to coronavirus disease 2019: Secondary | ICD-10-CM | POA: Diagnosis not present

## 2020-04-24 LAB — POCT INR: INR: 3.7 — AB (ref 2.0–3.0)

## 2020-04-24 NOTE — Patient Instructions (Signed)
Pre visit review using our clinic review tool, if applicable. No additional management support is needed unless otherwise documented below in the visit note.  Hold coumadin today and change dosage and take 3 mg daily except 6 mg on Sun and Thursdays.  Re-check in 2 weeks. Dosing instructions given to [place], [place] @ [facility].  [phone] Re-check on in 1 week.

## 2020-04-24 NOTE — Patient Outreach (Signed)
[facility]) Care Management  04/24/2020  TYCEN DOCKTER [birth year] [id number]   Call placed to member to follow up on Covid recovery.  No answer, HIPAA compliant voice message left.  Will follow up within the next 3-4 business days.  [name], [place], MSN [place] [phone]

## 2020-04-27 ENCOUNTER — Other Ambulatory Visit: Payer: Self-pay | Admitting: *Deleted

## 2020-04-27 ENCOUNTER — Telehealth (INDEPENDENT_AMBULATORY_CARE_PROVIDER_SITE_OTHER): Payer: MEDICARE | Admitting: Cardiology

## 2020-04-27 ENCOUNTER — Encounter: Payer: Self-pay | Admitting: Cardiology

## 2020-04-27 VITALS — BP 115/65 | HR 120 | Ht 71.5 in | Wt 296.0 lb

## 2020-04-27 DIAGNOSIS — Z7901 Long term (current) use of anticoagulants: Secondary | ICD-10-CM | POA: Diagnosis not present

## 2020-04-27 DIAGNOSIS — Z8616 Personal history of COVID-19: Secondary | ICD-10-CM

## 2020-04-27 DIAGNOSIS — I872 Venous insufficiency (chronic) (peripheral): Secondary | ICD-10-CM | POA: Diagnosis not present

## 2020-04-27 DIAGNOSIS — I10 Essential (primary) hypertension: Secondary | ICD-10-CM

## 2020-04-27 DIAGNOSIS — I4819 Other persistent atrial fibrillation: Secondary | ICD-10-CM | POA: Diagnosis not present

## 2020-04-27 MED ORDER — AMIODARONE HCL 200 MG PO TABS: 200.0000 mg | ORAL_TABLET | Freq: Every day | ORAL | 3 refills | Status: DC

## 2020-04-27 NOTE — Progress Notes (Signed)
Virtual Visit via Video Note   This visit type was conducted due to national recommendations for restrictions regarding the COVID-19 Pandemic (e.g. social distancing) in an effort to limit this patient's exposure and mitigate transmission in our community.  Due to his co-morbid illnesses, this patient is at least at moderate risk for complications without adequate follow up.  This format is felt to be most appropriate for this patient at this time.  All issues noted in this document were discussed and addressed.  A limited physical exam was performed with this format.  Please refer to the patient's chart for his consent to telehealth for [facility].  The patient was identified using 2 identifiers.  Patient Location: Home Provider Location: Office/Clinic  Date:  04/27/2020   ID:  Robert [name], DOB 07-25-1950, MRN [id number]  PCP:  Cassandria Anger, MD  Cardiologist:  Buford Dresser, MD PhD (previously seen by Dr. Johnsie Cancel)  Referring MD: Cassandria Anger, MD   CC: follow up  History of Present Illness:    Robert [name] is a 69 y.o. male with a hx of prior DVT/PE, dyslipidemia, obesity, paroxysmal SVT who is seen in follow up at the request of [name], [name], MD for the evaluation and management of arrhythmia. He was previously seen by cardiology in 04/2014 by Dr. Johnsie Cancel. His initial consult with me was on 04/23/18. Please see that note for full review of his history.  Updated cardiac history: Cardiac monitor showed very frequent SVT events as well as a WCT event. Echo without major abnormalities. I recommended starting a low dose ditiazem to try to control the tachycardia, but he was very concerned about side effects and did not wish to start a medication. He saw Dr. Rayann Heman in EP who also reviewed monitor, discussed management options, recommended sleep study.  Today: Seen for a virtual visit. 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 He was unvaccinated prior to contracting Covid. Per hospital notes, has severe parenchymal lung injury from this. Received IV steroids, remdesivir and baricitinib. He has required home oxygen since his Covid infection. Course was complicated by atrial fibrillation with RVR, for which he is on amiodarone. He remains on coumadin long term due to history of VTE.   Can't walk 5-10 feet without needing to turn on his oxygen tank. Feels like he is gasping otherwise. Hard to get his O2 above 90% without 02; was 86% off O2 this AM. INRs have been labile. Can tell when his INR is up as he gets nosebleeds. Feels it is doing fair, understands it will be a long healing process. Has home health to assist. Getting routine weights, watching swelling. Checking heart rate routinely as well.   Infection was so severe, at one point the team called his wife to come in because they didn't think he would make it.   Heart rates have been highly variable. Can range anywhere from 60 bpm-rare 160 bpm, most times above 100 bpm. Has not been able to tolerate metoprolol in the past due to asthma. Has had anaphylaxis on diltiazem. We discussed options for management. Discussed digoxin, but without beta blocker unlikely to be significantly helpful.   ROS diffusely positive, has memory issues, severe shortness of breath, presyncope. No full syncope. Conversational dyspnea. His phlebitis has worsened as well, improves with wrapping legs. Sense of taste is off, losing weight as no food tastes good.  Past Medical History:  Diagnosis Date  . Anxiety   .  Asthma    "no attack since acupuncture in 1990's"  . Complication of anesthesia    SLOW TO WAKE UP / DIFFICULTY RESPONDING 2003, 3 days before could use left arm, "wild/crazy sometimes"  . Cyst of left kidney    "benign"  . DVT (deep venous thrombosis) ([place]) 2003   right femoral artery "from groin to knee"  . Edema    Right Leg w/ h/o DVT postphlebitic   . H/O blood transfusion reaction 2004   FFP, extreme swelling, could not breath  . Headache(784.0)    MIGRAINES-not for a long time  . History of pulmonary embolism 2003   both lungs  . Hyperlipidemia   . Knee pain    left  . LBP (low back pain)   . 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 Obesity   . Osteoarthritis   . Peripheral vascular disease ([place])    "poor circulation in  RT leg"  . PONV (postoperative nausea and vomiting)    during surgery in 1990's  . Spondylolisthesis   . Warfarin anticoagulation   . Wheezing    when laying on left side    Past Surgical History:  Procedure Laterality Date  . APPENDECTOMY  1993   Dr Margot Chimes  . CHOLECYSTECTOMY  1993  . FINGER SURGERY  2012   RT THUMB RECONSTRUCTION  . HAND SURGERY Right    abcess  . HEMORROIDECTOMY  1981  . I & D KNEE WITH POLY EXCHANGE Left 04/04/2013   Procedure: IRRIGATION AND DEBRIDEMENT KNEE WITH POLY EXCHANGE AND INSERTION OF CEMENT BEADS;  Surgeon: Gearlean Alf, MD;  Location: [facility];  Service: Orthopedics;  Laterality: Left;  . KNEE ARTHROSCOPY  1990   RT KNEE  . KNEE ARTHROSCOPY Right 2003  . KNEE ARTHROSCOPY  1988   L KNEE  . KNEE ARTHROSCOPY WITH MENISCAL REPAIR Left 10/22/2012   Procedure: LEFT KNEE ARTHROSCOPY PARTIAL MEDIAL AND LATERAL MENISCECTOMY AND DEBRIDEMENT, CHONDROPLASTY ;  Surgeon: Johnn Hai, MD;  Location: [facility];  Service: Orthopedics;  Laterality: Left;  . LUMBAR DISC SURGERY    . LUMBAR FUSION  2003   Dr Maxie Better  . ORCHIECTOMY  1994   R post injury  . TONSILLECTOMY  1967  . TOTAL KNEE ARTHROPLASTY Left 03/21/2013   Procedure: LEFT TOTAL KNEE ARTHROPLASTY;  Surgeon: Gearlean Alf, MD;  Location: [facility];  Service: Orthopedics;  Laterality: Left;    Current Medications: Current Outpatient Medications on File Prior to Visit  Medication Sig  . albuterol (PROVENTIL) (2.5 MG/3ML) 0.083% nebulizer solution Take 3 mLs (2.5 mg total) by nebulization every 4 (four)  hours as needed for wheezing or shortness of breath. Dx  J45.909  . albuterol (VENTOLIN HFA) 108 (90 Base) MCG/ACT inhaler Inhale 2 puffs into the lungs every 6 (six) hours as needed for wheezing or shortness of breath.  [name] amiodarone (PACERONE) 200 MG tablet 400 twice daily till (04/06/20), then 2 weeks of 200 twice daily, then 200 mg daily thereafter.  . benzonatate (TESSALON PERLES) 100 MG capsule Take 1 capsule (100 mg total) by mouth every 6 (six) hours as needed for cough.  . Cholecalciferol (VITAMIN D3) 125 MCG (5000 UT) CAPS Take 1 capsule (5,000 Units total) by mouth daily.  [name] doxycycline (VIBRA-TABS) 100 MG tablet Take 1 tablet (100 mg total) by mouth 2 (two) times daily.  . famotidine (PEPCID) 20 MG tablet Take 1 tablet (20 mg total) by mouth daily.  [name]  fish oil-omega-3 fatty acids 1000 MG capsule Take 2 g by mouth every morning.  . fluticasone (FLONASE) 50 MCG/ACT nasal spray Place 2 sprays into both nostrils daily. (Patient taking differently: Place 2 sprays into both nostrils daily as needed for allergies. )  . furosemide (LASIX) 20 MG tablet Take 1 tablet (20 mg total) by mouth daily.  [name] glucosamine-chondroitin 500-400 MG tablet Take 1 tablet by mouth every morning.  [name] guaiFENesin-dextromethorphan (ROBITUSSIN DM) 100-10 MG/5ML syrup Take 10 mLs by mouth every 4 (four) hours as needed for cough.  . nortriptyline (PAMELOR) 10 MG capsule Take 1 capsule (10 mg total) by mouth every evening.  . predniSONE (DELTASONE) 5 MG tablet Take 1 tablet (5 mg total) by mouth daily with breakfast. Continue your previous home dose 5 mg of prednisone daily unchanged as before.  . warfarin (COUMADIN) 3 MG tablet Take 1 tablet on Mon Wed and Fri or as directed by anticoagulation clinic  . warfarin (COUMADIN) 6 MG tablet TAKE 1 TABLET ON SUNDAY, TUESDAY, THURSDAY AND SATURDAY OR AS DIRECTED BY ANTICOAGULATION CLINIC   No current facility-administered medications on file prior to visit.     Allergies:    Cat hair extract, Diltiazem, Sulfa antibiotics, Anoro ellipta [umeclidinium-vilanterol], Breo ellipta [fluticasone furoate-vilanterol], Exalgo [hydromorphone hcl], Fentanyl, Gabapentin, Hydrocodone-acetaminophen, Lopressor [metoprolol tartrate], Penicillins, Sulfonamide derivatives, and Tylenol [acetaminophen]   Social History   Tobacco Use  . Smoking status: Never Smoker  . Smokeless tobacco: Never Used  Vaping Use  . Vaping Use: Never used  Substance Use Topics  . Alcohol use: No  . Drug use: No    Family History: The patient's family history includes Heart disease in his mother and sister; Hyperlipidemia in an other family member; Hypertension in an other family member; Parkinsonism in an other family member. son passed away with hard heartbeats. Mother had 5V CABG about 10 years ago, had to have a pacemaker placed.  ROS:   Please see the history of present illness.  Additional pertinent ROS otherwise unremarkable.    EKGs/Labs/Other Studies Reviewed:    The following studies were reviewed today: Echo 03/24/20 1. Left ventricular ejection fraction, by estimation, is 55 to 60%. The  left ventricle has normal function. The left ventricle has no regional  wall motion abnormalities. Left ventricular diastolic parameters are  indeterminate.  2. Right ventricular systolic function is normal. The right ventricular  size is normal.  3. Left atrial size was mild to moderately dilated.  4. The mitral valve is normal in structure. No evidence of mitral valve  regurgitation. No evidence of mitral stenosis.  5. The aortic valve has an indeterminant number of cusps. There is  moderate calcification of the aortic valve. There is moderate thickening  of the aortic valve. Aortic valve regurgitation is not visualized. Mild to  moderate aortic valve stenosis.Aortic  valve mean gradient measures 19.3 mmHg. Aortic valve peak gradient  measures 32.1 mmHg. Aortic valve area, by VTI measures  1.26 cm.  Echo 05/06/18 - Left ventricle: The cavity size was normal. Systolic function was   normal. The estimated ejection fraction was in the range of 60%   to 65%. Wall motion was normal; there were no regional wall   motion abnormalities. - Aortic valve: There was very mild stenosis. Valve area (VTI):   1.67 cm^2. Valve area (Vmax): 1.85 cm^2. Valve area (Vmean): 1.59   cm^2. - Mitral valve: There was mild regurgitation. - Left atrium: The atrium was mildly to moderately dilated.  Impressions: - Left ventricular systolic function is preserved visually   estimated ejection fraction 60 to 65%.   Mild mitral regurgitation.   Mild to moderate left atrial enlargement   Very mild aortic stenosis.  Monitor 05/27/18 Patient had a min HR of 53 bpm, max HR of 214 bpm, and avg HR of 89 bpm. Predominant underlying rhythm was Sinus Rhythm. There were 9 patient triggered events, and Supraventricular Tachycardia was detected within +/- 45 seconds of symptomatic patient event(s).   Monitor noted 6 episodes of wide complex tachycardia; these are labeled as VT but are different morphology from the majority of his PVCs and bigeminy/trigeminy. Could be VT vs. SVT with aberrancy, given that these events occurred at a faster heart rate than his predominant SVT. The fastest WCT lasted 15 beats with a max rate of 197 bpm (avg 164 bpm); the run with the fastest interval was also the longest.   4752 narrow complex Supraventricular Tachycardia runs occurred, the run with the fastest interval lasting 1 min 37 secs with a max rate of 214 bpm, the longest lasting 56 mins 49 secs with an avg rate of 126 bpm. Some episodes of Supraventricular Tachycardia conducted with possible aberrancy. Isolated SVEs were frequent (10.9%, [id number]), SVE Couplets were rare (<1.0%, 7844), and SVE Triplets were rare (<1.0%, 793). Isolated VEs were occasional (2.1%, [id number]), VE Couplets were rare (<1.0%, 338), and VE Triplets were rare  (<1.0%, 5). Ventricular Bigeminy and Trigeminy were present.  Echo stress 03/27/11 - Stress ECG conclusions: The stress ECG was normal. - Staged echo: There was no echocardiographic evidence for stress-induced ischemia. Impressions:  - Stress echo with chest tightness but no ST changes; no stress-induced wall motion abnormalities  EKG:  ECG personally reviewed. The ekg ordered 03/29/20 demonstrates atrial fibrillation with RVR  Recent Labs: 03/26/2020: TSH 1.169 04/04/2020: ALT 77; B Natriuretic Peptide 107.7; BUN 25; Creatinine, Ser 0.98; Hemoglobin 14.7; Magnesium 2.1; Platelets 263; Potassium 4.7; Sodium 135  Recent Lipid Panel    Component Value Date/Time   CHOL 208 (H) 03/30/2019 0742   TRIG 112 03/23/2020 0703   TRIG 118 04/30/2006 0841   HDL 34.40 (L) 03/30/2019 0742   CHOLHDL 6 03/30/2019 0742   VLDL 33.0 03/30/2019 0742   LDLCALC 141 (H) 03/30/2019 0742   LDLDIRECT 137.0 03/10/2017 0842    Physical Exam:    VS:  BP 115/65   Pulse (!) 120   Ht 5' 11.5" (1.816 m)   Wt 296 lb (134.3 kg)   SpO2 92%   BMI 40.71 kg/m     Wt Readings from Last 3 Encounters:  04/27/20 296 lb (134.3 kg)  03/29/20 (!) 306 lb (138.8 kg)  03/18/20 (!) 324 lb 11.8 oz (147.3 kg)    VITAL SIGNS:  reviewed GEN:  no acute distress EYES:  sclerae anicteric, EOMI - Extraocular Movements Intact RESPIRATORY:  normal respiratory effort, symmetric expansion. Is conversationally dyspneic CARDIOVASCULAR:  no visible JVD SKIN:  no rash, lesions or ulcers. MUSCULOSKELETAL:  no obvious deformities. NEURO:  alert and oriented x 3, no obvious focal deficit PSYCH:  normal affect  ASSESSMENT:    1. Personal history of COVID-19   2. Persistent atrial fibrillation ([place])   3. Long term current use of anticoagulant   4. Essential hypertension   5. Chronic venous insufficiency    PLAN:    History of Covid-19 with prolonged course/hospitalization -recovering slowly, not at baseline -he reports  that he has residual severe lung injury -dyspneic on conversation,  requires frequent home O2  Atrial fibrillation with RVR: now persistent -noted during covid hospitalization -reports variable heart rates at home -limited options for rate control: had anaphylaxis on diltiazem, has not tolerated metoprolol as it worsens his asthma. Given his severe lung injury post covid, do not want to worsen this. -continue amiodarone, discussed today. Also reviewed how amiodarone can affect coumadin -CHA2DS2/VAS Stroke Risk Points=4 -on long term coumadin, denies bleeding  Hypertension: at goal today -currently only on lasix  History of DVT/PE with chronic venous insufficiency, on long term anticoagulation with coumadin: not interested in DOAC, continue coumadin therapy.  -Uses compression stocking for insufficiency  Dyslipidemia: -see prior discussions, declines statins, personal preference to continue fish oilk  Obesity: BMI 40  CV risk counseling and prevention -recommend heart healthy/Mediterranean diet, with whole grains, fruits, vegetable, fish, lean meats, nuts, and olive oil. Limit salt. -recommend moderate walking, 3-5 times/week for 30-50 minutes each session. Aim for at least 150 minutes.week. Goal should be pace of 3 miles/hours, or walking 1.5 miles in 30 minutes -recommend avoidance of tobacco products. Avoid excess alcohol. -ASCVD risk score: The 10-year ASCVD risk score Mikey Bussing DC Brooke Bonito., et al., 2013) is: 18.7%   Values used to calculate the score:     Age: 36 years     Sex: Male     Is Non-Hispanic African American: No     Diabetic: No     Tobacco smoker: No     Systolic Blood Pressure: 401 mmHg     Is BP treated: Yes     HDL Cholesterol: 34.4 mg/dL     Total Cholesterol: 208 mg/dL   Plan for follow up: 1 month  Today, I have spent 34 minutes with the patient with telehealth technology discussing the above problems.  Additional time spent in chart review, documentation, and  communication.  Medication Adjustments/Labs and Tests Ordered: Current medicines are reviewed at length with the patient today.  Concerns regarding medicines are outlined above.  No orders of the defined types were placed in this encounter.  Meds ordered this encounter  Medications  . amiodarone (PACERONE) 200 MG tablet    Sig: Take 1 tablet (200 mg total) by mouth daily.    Dispense:  90 tablet    Refill:  3    Patient Instructions  Medication Instructions:  Your Physician recommend you continue on your current medication as directed.    *If you need a refill on your cardiac medications before your next appointment, please call your pharmacy*   Lab Work: None  Testing/Procedures: None   Follow-Up: At [facility], you and your health needs are our priority.  As part of our continuing mission to provide you with exceptional heart care, we have created designated Provider Care Teams.  These Care Teams include your primary Cardiologist (physician) and Advanced Practice Providers (APPs -  Physician Assistants and Nurse Practitioners) who all work together to provide you with the care you need, when you need it.  We recommend signing up for the patient portal called "MyChart".  Sign up information is provided on this After Visit Summary.  MyChart is used to connect with patients for Virtual Visits (Telemedicine).  Patients are able to view lab/test results, encounter notes, upcoming appointments, etc.  Non-urgent messages can be sent to your provider as well.   To learn more about what you can do with MyChart, go to [email or web address].    Your next appointment:   1 month(s)  The format for your next  appointment:   Virtual Visit   Provider:   Buford Dresser, MD       Signed, Buford Dresser, MD PhD 04/27/2020    [place]

## 2020-04-27 NOTE — Patient Instructions (Signed)
Medication Instructions:  Your Physician recommend you continue on your current medication as directed.    *If you need a refill on your cardiac medications before your next appointment, please call your pharmacy*   Lab Work: None   Testing/Procedures: None   Follow-Up: At [facility], you and your health needs are our priority.  As part of our continuing mission to provide you with exceptional heart care, we have created designated Provider Care Teams.  These Care Teams include your primary Cardiologist (physician) and Advanced Practice Providers (APPs -  Physician Assistants and Nurse Practitioners) who all work together to provide you with the care you need, when you need it.  We recommend signing up for the patient portal called "MyChart".  Sign up information is provided on this After Visit Summary.  MyChart is used to connect with patients for Virtual Visits (Telemedicine).  Patients are able to view lab/test results, encounter notes, upcoming appointments, etc.  Non-urgent messages can be sent to your provider as well.   To learn more about what you can do with MyChart, go to [email or web address].    Your next appointment:   1 month(s)  The format for your next appointment:   Virtual Visit   Provider:   Bridgette Christopher, MD      

## 2020-04-27 NOTE — Patient Outreach (Signed)
[facility]) Care Management  04/27/2020  JENNIE BOLAR [birth year] [id number]   Outreach attempt #2, successful.    State he is still having a difficult time with his recovery, particularly his breathing with exertion.  He is using his oxygen and performing his breathing exercises, which he feel is providing some relief.  Does still have a cough, taking Tessalon perles for suppressant.  Had a follow up x-ray done, will have a virtual visit with PCP on 11/11, anticipate he will receive referral for pulmonologist at that time.  Also had virtual visit with cardiology today, remain with episodes of elevated heart rate.  Report minimal episodes of heart palpitation, denies chest pain.  Home health remains involved, helping to monitor member's weights (down to 296 pounds), heart rate and blood pressure (was 120 and 115/65 today).  He acknowledges that it will take time to fully recover, denies any urgent concerns.  He will continue to monitor daily vitals and use of oxygen/breathing devices.  Encouraged to contact this care manager with questions.  Agrees to follow up within the next month.  Goals Addressed            This Visit's Progress   . [facility] - Make and Keep All Appointments   On track    Follow Up Date 11/15   - ask family or friend for a ride - call to cancel if needed - keep a calendar with appointment dates    Why is this important?   Part of staying healthy is seeing the doctor for follow-up care.  If you forget your appointments, there are some things you can do to stay on track.    Notes:     . [facility] - Will not be readmitted within the next 31 days   On track    Reviewed discharge AVS with patient Educated on importance of active involvement with home health      [name], Therapist, sports, MSN [place] [phone]

## 2020-04-30 DIAGNOSIS — I4891 Unspecified atrial fibrillation: Secondary | ICD-10-CM | POA: Diagnosis not present

## 2020-04-30 DIAGNOSIS — R911 Solitary pulmonary nodule: Secondary | ICD-10-CM | POA: Diagnosis not present

## 2020-04-30 DIAGNOSIS — I872 Venous insufficiency (chronic) (peripheral): Secondary | ICD-10-CM | POA: Diagnosis not present

## 2020-04-30 DIAGNOSIS — J1282 Pneumonia due to coronavirus disease 2019: Secondary | ICD-10-CM | POA: Diagnosis not present

## 2020-04-30 DIAGNOSIS — U071 COVID-19: Secondary | ICD-10-CM | POA: Diagnosis not present

## 2020-05-01 ENCOUNTER — Ambulatory Visit (INDEPENDENT_AMBULATORY_CARE_PROVIDER_SITE_OTHER): Payer: MEDICARE | Admitting: General Practice

## 2020-05-01 DIAGNOSIS — Z7901 Long term (current) use of anticoagulants: Secondary | ICD-10-CM

## 2020-05-01 DIAGNOSIS — Z86711 Personal history of pulmonary embolism: Secondary | ICD-10-CM | POA: Diagnosis not present

## 2020-05-01 DIAGNOSIS — I4891 Unspecified atrial fibrillation: Secondary | ICD-10-CM | POA: Diagnosis not present

## 2020-05-01 DIAGNOSIS — R911 Solitary pulmonary nodule: Secondary | ICD-10-CM | POA: Diagnosis not present

## 2020-05-01 DIAGNOSIS — I872 Venous insufficiency (chronic) (peripheral): Secondary | ICD-10-CM | POA: Diagnosis not present

## 2020-05-01 DIAGNOSIS — U071 COVID-19: Secondary | ICD-10-CM | POA: Diagnosis not present

## 2020-05-01 DIAGNOSIS — J1282 Pneumonia due to coronavirus disease 2019: Secondary | ICD-10-CM | POA: Diagnosis not present

## 2020-05-01 LAB — POCT INR: INR: 3.7 — AB (ref 2.0–3.0)

## 2020-05-01 NOTE — Patient Instructions (Signed)
Pre visit review using our clinic review tool, if applicable. No additional management support is needed unless otherwise documented below in the visit note.  Hold coumadin today and change dosage and take 3 mg daily except 6 mg on Sun.    Re-check in 1 week. Dosing instructions given to [place], [place] @ [facility].  [phone]

## 2020-05-03 ENCOUNTER — Telehealth (INDEPENDENT_AMBULATORY_CARE_PROVIDER_SITE_OTHER): Payer: MEDICARE | Admitting: Internal Medicine

## 2020-05-03 ENCOUNTER — Encounter: Payer: Self-pay | Admitting: Internal Medicine

## 2020-05-03 ENCOUNTER — Ambulatory Visit: Payer: MEDICARE

## 2020-05-03 DIAGNOSIS — I10 Essential (primary) hypertension: Secondary | ICD-10-CM | POA: Diagnosis not present

## 2020-05-03 DIAGNOSIS — J9601 Acute respiratory failure with hypoxia: Secondary | ICD-10-CM | POA: Diagnosis not present

## 2020-05-03 DIAGNOSIS — R310 Gross hematuria: Secondary | ICD-10-CM | POA: Diagnosis not present

## 2020-05-03 DIAGNOSIS — Z66 Do not resuscitate: Secondary | ICD-10-CM | POA: Diagnosis not present

## 2020-05-03 DIAGNOSIS — R6883 Chills (without fever): Secondary | ICD-10-CM | POA: Insufficient documentation

## 2020-05-03 DIAGNOSIS — U071 COVID-19: Secondary | ICD-10-CM

## 2020-05-03 MED ORDER — FUROSEMIDE 20 MG PO TABS
20.0000 mg | ORAL_TABLET | Freq: Every day | ORAL | 3 refills | Status: DC
Start: 2020-05-03 — End: 2020-07-26

## 2020-05-03 NOTE — Assessment & Plan Note (Signed)
Losartan 

## 2020-05-03 NOTE — Assessment & Plan Note (Signed)
On Doxy now UA if needed

## 2020-05-03 NOTE — Assessment & Plan Note (Signed)
05/03/20 [name] wanted me to re-instate a DNR order - done

## 2020-05-03 NOTE — Assessment & Plan Note (Signed)
No relapse 

## 2020-05-03 NOTE — Assessment & Plan Note (Addendum)
On 2.5 l/min now via [place] for O2 ?CHF on CXR - increase Lasix to 40 mg/d

## 2020-05-03 NOTE — Progress Notes (Signed)
Virtual Visit via Video Note  I connected with Robert [name] on 05/03/20 at  1:20 PM EST by a video enabled telemedicine application and verified that I am speaking with the correct person using two identifiers.   I discussed the limitations of evaluation and management by telemedicine and the availability of in person appointments. The patient expressed understanding and agreed to proceed.  I was located at our [facility] office. The patient was at home. There was no one else present in the visit.   History of Present Illness: We need to follow-up on post-COVID, hypoxeia C/o chills at times  [name] wanted me to re-instate a DNR order - done There has been no runny nose, cough, chest pain, shortness of breath, abdominal pain, diarrhea, constipation, arthralgias, skin rashes.   Observations/Objective: The patient appears to be in no acute distress, looks well.  Assessment and Plan:  See my Assessment and Plan. Follow Up Instructions:    I discussed the assessment and treatment plan with the patient. The patient was provided an opportunity to ask questions and all were answered. The patient agreed with the plan and demonstrated an understanding of the instructions.   The patient was advised to call back or seek an in-person evaluation if the symptoms worsen or if the condition fails to improve as anticipated.  I provided face-to-face time during this encounter. We were at different locations.   Walker Kehr, MD

## 2020-05-04 DIAGNOSIS — J1282 Pneumonia due to coronavirus disease 2019: Secondary | ICD-10-CM | POA: Diagnosis not present

## 2020-05-04 DIAGNOSIS — U071 COVID-19: Secondary | ICD-10-CM | POA: Diagnosis not present

## 2020-05-04 DIAGNOSIS — I872 Venous insufficiency (chronic) (peripheral): Secondary | ICD-10-CM | POA: Diagnosis not present

## 2020-05-04 DIAGNOSIS — I4891 Unspecified atrial fibrillation: Secondary | ICD-10-CM | POA: Diagnosis not present

## 2020-05-04 DIAGNOSIS — R911 Solitary pulmonary nodule: Secondary | ICD-10-CM | POA: Diagnosis not present

## 2020-05-05 DIAGNOSIS — R911 Solitary pulmonary nodule: Secondary | ICD-10-CM | POA: Diagnosis not present

## 2020-05-05 DIAGNOSIS — J1282 Pneumonia due to coronavirus disease 2019: Secondary | ICD-10-CM | POA: Diagnosis not present

## 2020-05-05 DIAGNOSIS — I10 Essential (primary) hypertension: Secondary | ICD-10-CM | POA: Diagnosis not present

## 2020-05-05 DIAGNOSIS — I4891 Unspecified atrial fibrillation: Secondary | ICD-10-CM | POA: Diagnosis not present

## 2020-05-05 DIAGNOSIS — U071 COVID-19: Secondary | ICD-10-CM | POA: Diagnosis not present

## 2020-05-05 DIAGNOSIS — Z5181 Encounter for therapeutic drug level monitoring: Secondary | ICD-10-CM | POA: Diagnosis not present

## 2020-05-05 DIAGNOSIS — Z7901 Long term (current) use of anticoagulants: Secondary | ICD-10-CM | POA: Diagnosis not present

## 2020-05-05 DIAGNOSIS — M6281 Muscle weakness (generalized): Secondary | ICD-10-CM | POA: Diagnosis not present

## 2020-05-05 DIAGNOSIS — M47896 Other spondylosis, lumbar region: Secondary | ICD-10-CM | POA: Diagnosis not present

## 2020-05-05 DIAGNOSIS — I872 Venous insufficiency (chronic) (peripheral): Secondary | ICD-10-CM | POA: Diagnosis not present

## 2020-05-05 DIAGNOSIS — Z9981 Dependence on supplemental oxygen: Secondary | ICD-10-CM | POA: Diagnosis not present

## 2020-05-05 DIAGNOSIS — Z86718 Personal history of other venous thrombosis and embolism: Secondary | ICD-10-CM | POA: Diagnosis not present

## 2020-05-08 ENCOUNTER — Ambulatory Visit: Payer: Self-pay | Admitting: *Deleted

## 2020-05-08 DIAGNOSIS — U071 COVID-19: Secondary | ICD-10-CM | POA: Diagnosis not present

## 2020-05-08 DIAGNOSIS — I872 Venous insufficiency (chronic) (peripheral): Secondary | ICD-10-CM | POA: Diagnosis not present

## 2020-05-08 DIAGNOSIS — J1282 Pneumonia due to coronavirus disease 2019: Secondary | ICD-10-CM | POA: Diagnosis not present

## 2020-05-08 DIAGNOSIS — R911 Solitary pulmonary nodule: Secondary | ICD-10-CM | POA: Diagnosis not present

## 2020-05-08 DIAGNOSIS — I4891 Unspecified atrial fibrillation: Secondary | ICD-10-CM | POA: Diagnosis not present

## 2020-05-10 ENCOUNTER — Ambulatory Visit (INDEPENDENT_AMBULATORY_CARE_PROVIDER_SITE_OTHER): Payer: MEDICARE | Admitting: General Practice

## 2020-05-10 DIAGNOSIS — Z86711 Personal history of pulmonary embolism: Secondary | ICD-10-CM | POA: Diagnosis not present

## 2020-05-10 DIAGNOSIS — R911 Solitary pulmonary nodule: Secondary | ICD-10-CM | POA: Diagnosis not present

## 2020-05-10 DIAGNOSIS — I872 Venous insufficiency (chronic) (peripheral): Secondary | ICD-10-CM | POA: Diagnosis not present

## 2020-05-10 DIAGNOSIS — I4891 Unspecified atrial fibrillation: Secondary | ICD-10-CM | POA: Diagnosis not present

## 2020-05-10 DIAGNOSIS — J1282 Pneumonia due to coronavirus disease 2019: Secondary | ICD-10-CM | POA: Diagnosis not present

## 2020-05-10 DIAGNOSIS — Z7901 Long term (current) use of anticoagulants: Secondary | ICD-10-CM

## 2020-05-10 DIAGNOSIS — U071 COVID-19: Secondary | ICD-10-CM | POA: Diagnosis not present

## 2020-05-10 LAB — POCT INR: INR: 3.2 — AB (ref 2.0–3.0)

## 2020-05-10 NOTE — Patient Instructions (Signed)
Pre visit review using our clinic review tool, if applicable. No additional management support is needed unless otherwise documented below in the visit note.  Hold coumadin today and change dosage and take 3 mg daily . Re-check in 1 week. Dosing instructions given to [place], [place] @ [facility].  [phone]

## 2020-05-14 DIAGNOSIS — I872 Venous insufficiency (chronic) (peripheral): Secondary | ICD-10-CM | POA: Diagnosis not present

## 2020-05-14 DIAGNOSIS — J1282 Pneumonia due to coronavirus disease 2019: Secondary | ICD-10-CM | POA: Diagnosis not present

## 2020-05-14 DIAGNOSIS — R911 Solitary pulmonary nodule: Secondary | ICD-10-CM | POA: Diagnosis not present

## 2020-05-14 DIAGNOSIS — I4891 Unspecified atrial fibrillation: Secondary | ICD-10-CM | POA: Diagnosis not present

## 2020-05-14 DIAGNOSIS — U071 COVID-19: Secondary | ICD-10-CM | POA: Diagnosis not present

## 2020-05-21 ENCOUNTER — Ambulatory Visit (INDEPENDENT_AMBULATORY_CARE_PROVIDER_SITE_OTHER): Payer: MEDICARE | Admitting: General Practice

## 2020-05-21 DIAGNOSIS — Z7901 Long term (current) use of anticoagulants: Secondary | ICD-10-CM

## 2020-05-21 DIAGNOSIS — Z86711 Personal history of pulmonary embolism: Secondary | ICD-10-CM | POA: Diagnosis not present

## 2020-05-21 DIAGNOSIS — R911 Solitary pulmonary nodule: Secondary | ICD-10-CM | POA: Diagnosis not present

## 2020-05-21 DIAGNOSIS — I872 Venous insufficiency (chronic) (peripheral): Secondary | ICD-10-CM | POA: Diagnosis not present

## 2020-05-21 DIAGNOSIS — I4891 Unspecified atrial fibrillation: Secondary | ICD-10-CM | POA: Diagnosis not present

## 2020-05-21 DIAGNOSIS — U071 COVID-19: Secondary | ICD-10-CM | POA: Diagnosis not present

## 2020-05-21 DIAGNOSIS — J1282 Pneumonia due to coronavirus disease 2019: Secondary | ICD-10-CM | POA: Diagnosis not present

## 2020-05-21 LAB — POCT INR: INR: 2.6 (ref 2.0–3.0)

## 2020-05-21 NOTE — Patient Instructions (Signed)
Pre visit review using our clinic review tool, if applicable. No additional management support is needed unless otherwise documented below in the visit note.  Continue to take 3 mg daily . Re-check in 2 to 3 weeks. Dosing instructions given to [place], [place] @ [facility].  [phone]. Re-check in 2 weeks.

## 2020-05-22 DIAGNOSIS — I872 Venous insufficiency (chronic) (peripheral): Secondary | ICD-10-CM | POA: Diagnosis not present

## 2020-05-22 DIAGNOSIS — U071 COVID-19: Secondary | ICD-10-CM | POA: Diagnosis not present

## 2020-05-22 DIAGNOSIS — R911 Solitary pulmonary nodule: Secondary | ICD-10-CM | POA: Diagnosis not present

## 2020-05-22 DIAGNOSIS — J1282 Pneumonia due to coronavirus disease 2019: Secondary | ICD-10-CM | POA: Diagnosis not present

## 2020-05-22 DIAGNOSIS — I4891 Unspecified atrial fibrillation: Secondary | ICD-10-CM | POA: Diagnosis not present

## 2020-05-22 NOTE — Progress Notes (Signed)
Medical screening examination/treatment/procedure(s) were performed by non-physician practitioner and as supervising physician I was immediately available for consultation/collaboration. I agree with above. Vincient Vanaman, MD  

## 2020-05-24 DIAGNOSIS — J1282 Pneumonia due to coronavirus disease 2019: Secondary | ICD-10-CM | POA: Diagnosis not present

## 2020-05-24 DIAGNOSIS — I4891 Unspecified atrial fibrillation: Secondary | ICD-10-CM | POA: Diagnosis not present

## 2020-05-24 DIAGNOSIS — U071 COVID-19: Secondary | ICD-10-CM | POA: Diagnosis not present

## 2020-05-24 DIAGNOSIS — I872 Venous insufficiency (chronic) (peripheral): Secondary | ICD-10-CM | POA: Diagnosis not present

## 2020-05-24 DIAGNOSIS — R911 Solitary pulmonary nodule: Secondary | ICD-10-CM | POA: Diagnosis not present

## 2020-05-25 ENCOUNTER — Other Ambulatory Visit: Payer: Self-pay | Admitting: *Deleted

## 2020-05-25 NOTE — Patient Outreach (Signed)
[facility]) Care Management  05/25/2020  Robert [name] [birth year] [id number]   Outgoing call placed to member, state he is slowly improving.  He is no longer wearing the oxygen all day long, only at night.  Had virtual visit with PCP on 11/11, will follow up in person on 12/9 as well as cardiology on 12/9, has Medicare Wellness visit on 12/6.  Remains active with home health, report they have acknowledged huge improvement in his condition.  No longer using a cane or walker for ambulation, saturations remaining greater than 95%.  Continues to monitor daily BP, weights, and HR (105/52, 296 pounds, & 77 today).  Report decreased swelling in legs/feet with increase in Lasix.  Denies any shortness of breath or chest discomfort.  Does not have any urgent concerns, agrees to follow up within the next month.  Goals Addressed            This Visit's Progress   . [facility] - Make and Keep All Appointments   On track    Follow Up Date 07/07/2020   - ask family or friend for a ride - call to cancel if needed - keep a calendar with appointment dates    Why is this important?   Part of staying healthy is seeing the doctor for follow-up care.  If you forget your appointments, there are some things you can do to stay on track.    Notes:   12/3 - Reviewed upcoming appointments: PCP for follow up and [place]    . [facility] - Optimal Respiratory Status       Follow up 07/22/2020  Notes:  Reviewed breathing exercises, use of O2, and oxygen saturation monitoring    . COMPLETED: [facility] - Will not be readmitted within the next 31 days       Reviewed discharge AVS with patient Educated on importance of active involvement with home health      [name], Therapist, sports, MSN [place] [phone]

## 2020-05-28 ENCOUNTER — Ambulatory Visit (INDEPENDENT_AMBULATORY_CARE_PROVIDER_SITE_OTHER): Payer: MEDICARE

## 2020-05-28 VITALS — BP 128/74 | HR 97 | Ht 72.0 in | Wt 294.5 lb

## 2020-05-28 DIAGNOSIS — Z0000 Encounter for general adult medical examination without abnormal findings: Secondary | ICD-10-CM

## 2020-05-28 NOTE — Progress Notes (Addendum)
I connected with [name] today by telephone and verified that I am speaking with the correct person using two identifiers. Location patient: home Location provider: work Persons participating in the virtual visit: Jyron Turman and [employer], LPN.   I discussed the limitations, risks, security and privacy concerns of performing an evaluation and management service by telephone and the availability of in person appointments. I also discussed with the patient that there may be a patient responsible charge related to this service. The patient expressed understanding and verbally consented to this telephonic visit.    Interactive audio and video telecommunications were attempted between this provider and patient, however failed, due to patient having technical difficulties OR patient did not have access to video capability.  We continued and completed visit with audio only.  Some vital signs may be absent or patient reported.   Time Spent with patient on telephone encounter: 30 minutes  Subjective:   Robert [name] is a 69 y.o. male who presents for Medicare Annual/Subsequent preventive examination.  Review of Systems    No ROS. Medicare Wellness Visit. Additional risk factors are reflected in social history. Cardiac Risk Factors include: advanced age (>83men, >32 women);hypertension;male gender;family history of premature cardiovascular disease;dyslipidemia     Objective:    Today's Vitals   05/28/20 1433  BP: 128/74  Pulse: 97  SpO2: 96%  Weight: 294 lb 8 oz (133.6 kg)  Height: 6' (1.829 m)  PainSc: 0-No pain   Body mass index is 39.94 kg/m.  Advanced Directives 05/28/2020 04/10/2020 03/28/2020 03/23/2020 03/18/2020 03/17/2020 08/29/2019  Does Patient Have a Medical Advance Directive? Yes Yes Yes No No No Yes  Type of Paramedic of [place];Living will [place];Living will - - - -  Does  patient want to make changes to medical advance directive? - No - Patient declined No - Patient declined - - - -  Copy of [place] in Chart? No - copy requested - Yes - validated most recent copy scanned in chart (See row information) - - - -  Would patient like information on creating a medical advance directive? No - Patient declined No - Patient declined - No - Guardian declined No - Patient declined No - Patient declined -  Pre-existing out of facility DNR order (yellow form or pink MOST form) - - - - - - -    Current Medications (verified) Outpatient Encounter Medications as of 05/28/2020  Medication Sig   albuterol (PROVENTIL) (2.5 MG/3ML) 0.083% nebulizer solution Take 3 mLs (2.5 mg total) by nebulization every 4 (four) hours as needed for wheezing or shortness of breath. Dx  J45.909   albuterol (VENTOLIN HFA) 108 (90 Base) MCG/ACT inhaler Inhale 2 puffs into the lungs every 6 (six) hours as needed for wheezing or shortness of breath.   amiodarone (PACERONE) 200 MG tablet Take 1 tablet (200 mg total) by mouth daily.   benzonatate (TESSALON PERLES) 100 MG capsule Take 1 capsule (100 mg total) by mouth every 6 (six) hours as needed for cough.   Cholecalciferol (VITAMIN D3) 125 MCG (5000 UT) CAPS Take 1 capsule (5,000 Units total) by mouth daily.   doxycycline (VIBRA-TABS) 100 MG tablet Take 1 tablet (100 mg total) by mouth 2 (two) times daily.   famotidine (PEPCID) 20 MG tablet Take 1 tablet (20 mg total) by mouth daily.   fish oil-omega-3 fatty acids 1000 MG capsule Take 2 g by mouth every  morning.   fluticasone (FLONASE) 50 MCG/ACT nasal spray Place 2 sprays into both nostrils daily. (Patient taking differently: Place 2 sprays into both nostrils daily as needed for allergies. )   furosemide (LASIX) 20 MG tablet Take 1-2 tablets (20-40 mg total) by mouth daily.   glucosamine-chondroitin 500-400 MG tablet Take 1 tablet by mouth every morning.   guaiFENesin-dextromethorphan  (ROBITUSSIN DM) 100-10 MG/5ML syrup Take 10 mLs by mouth every 4 (four) hours as needed for cough.   nortriptyline (PAMELOR) 10 MG capsule Take 1 capsule (10 mg total) by mouth every evening.   predniSONE (DELTASONE) 5 MG tablet Take 1 tablet (5 mg total) by mouth daily with breakfast. Continue your previous home dose 5 mg of prednisone daily unchanged as before.   warfarin (COUMADIN) 3 MG tablet Take 1 tablet on Mon Wed and Fri or as directed by anticoagulation clinic   warfarin (COUMADIN) 6 MG tablet TAKE 1 TABLET ON SUNDAY, TUESDAY, THURSDAY AND SATURDAY OR AS DIRECTED BY ANTICOAGULATION CLINIC   No facility-administered encounter medications on file as of 05/28/2020.    Allergies (verified) Cat hair extract, Diltiazem, Sulfa antibiotics, Anoro ellipta [umeclidinium-vilanterol], Breo ellipta [fluticasone furoate-vilanterol], Exalgo [hydromorphone hcl], Fentanyl, Gabapentin, Hydrocodone-acetaminophen, Lopressor [metoprolol tartrate], Penicillins, Sulfonamide derivatives, and Tylenol [acetaminophen]   History: Past Medical History:  Diagnosis Date   Anxiety    Asthma    "no attack since acupuncture in 5784'O"   Complication of anesthesia    SLOW TO WAKE UP / DIFFICULTY RESPONDING 2003, 3 days before could use left arm, "wild/crazy sometimes"   Cyst of left kidney    "benign"   DVT (deep venous thrombosis) ([place]) 2003   right femoral artery "from groin to knee"   Edema    Right Leg w/ h/o DVT postphlebitic   H/O blood transfusion reaction 2004   FFP, extreme swelling, could not breath   Headache(784.0)    MIGRAINES-not for a long time   History of pulmonary embolism 2003   both lungs   Hyperlipidemia    Knee pain    left   LBP (low back pain)    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, clear in 2012"   Obesity    Osteoarthritis    Peripheral vascular disease ([place])    "poor circulation in  RT leg"   PONV (postoperative nausea and vomiting)    during surgery in 1990's    Spondylolisthesis    Warfarin anticoagulation    Wheezing    when laying on left side   Past Surgical History:  Procedure Laterality Date   APPENDECTOMY  1993   Dr Margot Chimes   CHOLECYSTECTOMY  1993   FINGER SURGERY  2012   RT [place] Right    abcess   [place] Left 04/04/2013   Procedure: IRRIGATION AND DEBRIDEMENT KNEE WITH POLY EXCHANGE AND INSERTION OF CEMENT BEADS;  Surgeon: Gearlean Alf, MD;  Location: [facility];  Service: Orthopedics;  Laterality: Left;   KNEE ARTHROSCOPY  1990   RT KNEE   KNEE ARTHROSCOPY Right 2003   KNEE ARTHROSCOPY  1988   L KNEE   KNEE ARTHROSCOPY WITH MENISCAL REPAIR Left 10/22/2012   Procedure: LEFT KNEE ARTHROSCOPY PARTIAL MEDIAL AND LATERAL MENISCECTOMY AND DEBRIDEMENT, CHONDROPLASTY ;  Surgeon: Johnn Hai, MD;  Location: [facility];  Service: Orthopedics;  Laterality: Left;   LUMBAR DISC SURGERY     LUMBAR FUSION  2003   Dr Maxie Better   ORCHIECTOMY  1994   R post injury   [place] Left 03/21/2013   Procedure: LEFT TOTAL KNEE ARTHROPLASTY;  Surgeon: Gearlean Alf, MD;  Location: [facility];  Service: Orthopedics;  Laterality: Left;   Family History  Problem Relation Age of Onset   Heart disease Mother        CABG at age 27   Hyperlipidemia Other    Hypertension Other    Parkinsonism Other    Heart disease Sister    Social History   Socioeconomic History   Marital status: Married    Spouse name: Not on file   Number of children: 3   Years of education: Not on file   Highest education level: Not on file  Occupational History   Occupation: Disabled    Employer: DISABLED  Tobacco Use   Smoking status: Never Smoker   Smokeless tobacco: Never Used  Scientific laboratory technician Use: Never used  Substance and Sexual Activity   Alcohol use: No   Drug use: No   Sexual activity: Yes  Other Topics Concern   Not on file  Social History Narrative    Right handed   One story home   Drinks caffeine coffee q am   Social Determinants of Health   Financial Resource Strain: Low Risk    Difficulty of Paying Living Expenses: Not hard at all  Food Insecurity: No Food Insecurity   Worried About Charity fundraiser in the Last Year: Never true   [place] in the Last Year: Never true  Transportation Needs: No Transportation Needs   Lack of Transportation (Medical): No   Lack of Transportation (Non-Medical): No  Physical Activity: Insufficiently Active   Days of Exercise per Week: 1 day   Minutes of Exercise per Session: 40 min  Stress: No Stress Concern Present   Feeling of Stress : Not at all  Social Connections:    Frequency of Communication with Friends and Family: Not on file   Frequency of Social Gatherings with Friends and Family: Not on file   Attends Religious Services: Not on Electrical engineer or Organizations: Not on file   Attends Archivist Meetings: Not on file   Marital Status: Not on file    Tobacco Counseling Counseling given: Not Answered   Clinical Intake:  Pre-visit preparation completed: Yes  Pain : No/denies pain Pain Score: 0-No pain     Nutritional Risks: Nausea/ vomitting/ diarrhea (due to Covid) Diabetes: No  How often do you need to have someone help you when you read instructions, pamphlets, or other written materials from your doctor or pharmacy?: 1 - Never What is the last grade level you completed in school?: BS Degree  Diabetic? no  Interpreter Needed?: No  Information entered by :: Danne Baxter, LPN   Activities of Daily Living In your present state of health, do you have any difficulty performing the following activities: 05/28/2020 03/28/2020  Hearing? N N  Vision? N N  Difficulty concentrating or making decisions? N N  Walking or climbing stairs? N Y  Dressing or bathing? N N  Doing errands, shopping? N N  Preparing Food and eating ? N -  Using  the Toilet? N -  In the past six months, have you accidently leaked urine? N -  Do you have problems with loss of bowel control? N -  Managing your Medications? N -  Managing your Finances? N -  Housekeeping or managing your Housekeeping? N -  Some recent data might be hidden    Patient Care Team: [name], [name], MD as PCP - General Buford Dresser, MD as PCP - Cardiology (Cardiology) Rigoberto Noel, MD (Pulmonary Disease) Susa Day, MD (Orthopedic Surgery) Gaynelle Arabian, MD as Attending Physician (Orthopedic Surgery) Josue Hector, MD as Consulting Physician (Cardiology) Thompson Grayer, MD as Consulting Physician (Cardiology) Pieter Partridge, DO as Consulting Physician (Neurology) [name], RN as [place] any recent Medical Services you may have received from other than [facility] providers in the past year (date may be approximate).     Assessment:   This is a routine wellness examination for [facility].  Hearing/Vision screen No exam data present  Dietary issues and exercise activities discussed: Current Exercise Habits: Home exercise routine, Type of exercise: walking;Other - see comments (Physical Therapy 1 x week), Time (Minutes): 20, Frequency (Times/Week): 1, Weekly Exercise (Minutes/Week): 20, Intensity: Mild, Exercise limited by: respiratory conditions(s)  Goals      [facility] - Make and Keep All Appointments     Follow Up Date 11/15   - ask family or friend for a ride - call to cancel if needed - keep a calendar with appointment dates    Why is this important?   Part of staying healthy is seeing the doctor for follow-up care.  If you forget your appointments, there are some things you can do to stay on track.    Notes:      [facility] - Will not be readmitted within the next 31 days     Reviewed discharge AVS with patient Educated on importance of active involvement with home health       Depression Screen PHQ  2/9 Scores 05/28/2020 04/10/2020 02/05/2018  PHQ - 2 Score 0 1 0  Exception Documentation - (No Data) -    Fall Risk Fall Risk  05/28/2020 08/29/2019 05/18/2019 02/05/2018 09/02/2016  Falls in the past year? 0 0 0 No Yes  Comment - - Emmi Telephone Survey: data to providers prior to load - -  Number falls in past yr: 0 0 - - 1  Injury with Fall? 0 0 - - Yes  Risk for fall due to : No Fall Risks - - - -  Follow up Falls evaluation completed - - - Falls evaluation completed    FALL RISK PREVENTION PERTAINING TO THE HOME:  Any stairs in or around the home? No  If so, are there any without handrails? No  Home free of loose throw rugs in walkways, pet beds, electrical cords, etc? Yes  Adequate lighting in your home to reduce risk of falls? Yes   ASSISTIVE DEVICES UTILIZED TO PREVENT FALLS:  Life alert? No  Use of a cane, walker or w/c? No  Grab bars in the bathroom? Yes  Shower chair or bench in shower? Yes  Elevated toilet seat or a handicapped toilet? Yes   TIMED UP AND GO:  Was the test performed? No .  Length of time to ambulate 10 feet: 0 sec.   Gait slow and steady without use of assistive device  Cognitive Function: not indicated.          Immunizations Immunization History  Administered Date(s) Administered   Fluad Quad(high Dose 65+) 03/21/2019   Influenza Split 03/22/2012   Influenza Whole 03/17/2008, 05/15/2009, 02/13/2010   Influenza, High Dose  Seasonal PF 03/26/2018   Influenza,inj,Quad PF,6+ Mos 03/14/2013, 05/09/2014, 03/06/2015, 02/18/2016   Influenza-Unspecified 03/04/2017   Pneumococcal Conjugate-13 08/19/2013   Pneumococcal Polysaccharide-23 04/04/2008, 02/05/2018   Td 06/27/2002   Tdap 09/16/2012   Zoster 06/06/2014    TDAP status: Up to date  Flu Vaccine status: Declined, Education has been provided regarding the importance of this vaccine but patient still declined. Advised may receive this vaccine at local pharmacy or Health Dept. Aware to  provide a copy of the vaccination record if obtained from local pharmacy or Health Dept. Verbalized acceptance and understanding.  Pneumococcal vaccine status: Up to date  Covid-19 vaccine status: Declined, Education has been provided regarding the importance of this vaccine but patient still declined. Advised may receive this vaccine at local pharmacy or Health Dept.or vaccine clinic. Aware to provide a copy of the vaccination record if obtained from local pharmacy or Health Dept. Verbalized acceptance and understanding.  Qualifies for Shingles Vaccine? Yes   Zostavax completed No   Shingrix Completed?: No.    Education has been provided regarding the importance of this vaccine. Patient has been advised to call insurance company to determine out of pocket expense if they have not yet received this vaccine. Advised may also receive vaccine at local pharmacy or Health Dept. Verbalized acceptance and understanding.  Screening Tests Health Maintenance  Topic Date Due   COVID-19 Vaccine (1) Never done   INFLUENZA VACCINE  01/22/2020   COLONOSCOPY  06/22/2020 (Originally 09/20/2001)   TETANUS/TDAP  09/17/2022   Hepatitis C Screening  Completed   PNA vac Low Risk Adult  Completed    Health Maintenance  Health Maintenance Due  Topic Date Due   COVID-19 Vaccine (1) Never done   INFLUENZA VACCINE  01/22/2020    Colon Cancer Screening: patient declined  Lung Cancer Screening: (Low Dose CT Chest recommended if Age 48-80 years, 30 pack-year currently smoking OR have quit w/in 15years.) does not qualify.   Lung Cancer Screening Referral: no  Additional Screening:  Hepatitis C Screening: does qualify; Completed yes  Vision Screening: Recommended annual ophthalmology exams for early detection of glaucoma and other disorders of the eye. Is the patient up to date with their annual eye exam?  Yes  Who is the provider or what is the name of the office in which the patient attends annual eye  exams? Cleon Gustin, MD. If pt is not established with a provider, would they like to be referred to a provider to establish care? No .   Dental Screening: Recommended annual dental exams for proper oral hygiene  Community Resource Referral / Chronic Care Management: CRR required this visit?  No   CCM required this visit?  No      Plan:     I have personally reviewed and noted the following in the patient's chart:   Medical and social history Use of alcohol, tobacco or illicit drugs  Current medications and supplements Functional ability and status Nutritional status Physical activity Advanced directives List of other physicians Hospitalizations, surgeries, and ER visits in previous 12 months Vitals Screenings to include cognitive, depression, and falls Referrals and appointments  In addition, I have reviewed and discussed with patient certain preventive protocols, quality metrics, and best practice recommendations. A written personalized care plan for preventive services as well as general preventive health recommendations were provided to patient.     Sheral Flow, LPN   28/08/1515   Nurse Notes:  Patient is cogitatively intact. Patient provided his own  vital signs for this visit.  Medical screening examination/treatment/procedure(s) were performed by non-physician practitioner and as supervising physician I was immediately available for consultation/collaboration.  I agree with above. Lew Dawes, MD

## 2020-05-28 NOTE — Progress Notes (Signed)
Medical screening examination/treatment/procedure(s) were performed by non-physician practitioner and as supervising physician I was immediately available for consultation/collaboration. I agree with above. Razia Screws, MD  

## 2020-05-28 NOTE — Progress Notes (Signed)
Medical screening examination/treatment/procedure(s) were performed by non-physician practitioner and as supervising physician I was immediately available for consultation/collaboration. I agree with above. Edgel Degnan, MD  

## 2020-05-28 NOTE — Patient Instructions (Signed)
[name] , Thank you for taking time to come for your Medicare Wellness Visit. I appreciate your ongoing commitment to your health goals. Please review the following plan we discussed and let me know if I can assist you in the future.   Screening recommendations/referrals: Colonoscopy: declined Recommended yearly ophthalmology/optometry visit for glaucoma screening and checkup Recommended yearly dental visit for hygiene and checkup  Vaccinations: Influenza vaccine: declined Pneumococcal vaccine: up to date Tdap vaccine: 09/16/2012; due every 10 years Shingles vaccine: declined  Covid-19: declined  Advanced directives: Documents on file  Conditions/risks identified: Yes; Reviewed health maintenance screenings with patient today and relevant education, vaccines, and/or referrals were provided. Please continue to do your personal lifestyle choices by: daily care of teeth and gums, regular physical activity (goal should be 5 days a week for 30 minutes), eat a healthy diet, avoid tobacco and drug use, limiting any alcohol intake, taking a low-dose aspirin (if not allergic or have been advised by your provider otherwise) and taking vitamins and minerals as recommended by your provider. Continue doing brain stimulating activities (puzzles, reading, adult coloring books, staying active) to keep memory sharp. Continue to eat heart healthy diet (full of fruits, vegetables, whole grains, lean protein, water--limit salt, fat, and sugar intake) and increase physical activity as tolerated.  Next appointment: Please schedule your next Medicare Wellness Visit with your Nurse Health Advisor in 1 year by calling [phone].  Preventive Care 4 Years and Older, Male Preventive care refers to lifestyle choices and visits with your health care provider that can promote health and wellness. What does preventive care include?  A yearly physical exam. This is also called an annual well check.  Dental exams  once or twice a year.  Routine eye exams. Ask your health care provider how often you should have your eyes checked.  Personal lifestyle choices, including:  Daily care of your teeth and gums.  Regular physical activity.  Eating a healthy diet.  Avoiding tobacco and drug use.  Limiting alcohol use.  Practicing safe sex.  Taking low doses of aspirin every day.  Taking vitamin and mineral supplements as recommended by your health care provider. What happens during an annual well check? The services and screenings done by your health care provider during your annual well check will depend on your age, overall health, lifestyle risk factors, and family history of disease. Counseling  Your health care provider may ask you questions about your:  Alcohol use.  Tobacco use.  Drug use.  Emotional well-being.  Home and relationship well-being.  Sexual activity.  Eating habits.  History of falls.  Memory and ability to understand (cognition).  Work and work Statistician. Screening  You may have the following tests or measurements:  Height, weight, and BMI.  Blood pressure.  Lipid and cholesterol levels. These may be checked every 5 years, or more frequently if you are over 74 years old.  Skin check.  Lung cancer screening. You may have this screening every year starting at age 28 if you have a 30-pack-year history of smoking and currently smoke or have quit within the past 15 years.  Fecal occult blood test (FOBT) of the stool. You may have this test every year starting at age 1.  Flexible sigmoidoscopy or colonoscopy. You may have a sigmoidoscopy every 5 years or a colonoscopy every 10 years starting at age 55.  Prostate cancer screening. Recommendations will vary depending on your family history and other risks.  Hepatitis C blood test.  Hepatitis B blood test.  Sexually transmitted disease (STD) testing.  Diabetes screening. This is done by checking your  blood sugar (glucose) after you have not eaten for a while (fasting). You may have this done every 1-3 years.  Abdominal aortic aneurysm (AAA) screening. You may need this if you are a current or former smoker.  Osteoporosis. You may be screened starting at age 47 if you are at high risk. Talk with your health care provider about your test results, treatment options, and if necessary, the need for more tests. Vaccines  Your health care provider may recommend certain vaccines, such as:  Influenza vaccine. This is recommended every year.  Tetanus, diphtheria, and acellular pertussis (Tdap, Td) vaccine. You may need a Td booster every 10 years.  Zoster vaccine. You may need this after age 20.  Pneumococcal 13-valent conjugate (PCV13) vaccine. One dose is recommended after age 82.  Pneumococcal polysaccharide (PPSV23) vaccine. One dose is recommended after age 23. Talk to your health care provider about which screenings and vaccines you need and how often you need them. This information is not intended to replace advice given to you by your health care provider. Make sure you discuss any questions you have with your health care provider. Document Released: 07/06/2015 Document Revised: 02/27/2016 Document Reviewed: 04/10/2015 Elsevier Interactive Patient Education  2017 [place] Prevention in the Home Falls can cause injuries. They can happen to people of all ages. There are many things you can do to make your home safe and to help prevent falls. What can I do on the outside of my home?  Regularly fix the edges of walkways and driveways and fix any cracks.  Remove anything that might make you trip as you walk through a door, such as a raised step or threshold.  Trim any bushes or trees on the path to your home.  Use bright outdoor lighting.  Clear any walking paths of anything that might make someone trip, such as rocks or tools.  Regularly check to see if handrails are  loose or broken. Make sure that both sides of any steps have handrails.  Any raised decks and porches should have guardrails on the edges.  Have any leaves, snow, or ice cleared regularly.  Use sand or salt on walking paths during winter.  Clean up any spills in your garage right away. This includes oil or grease spills. What can I do in the bathroom?  Use night lights.  Install grab bars by the toilet and in the tub and shower. Do not use towel bars as grab bars.  Use non-skid mats or decals in the tub or shower.  If you need to sit down in the shower, use a plastic, non-slip stool.  Keep the floor dry. Clean up any water that spills on the floor as soon as it happens.  Remove soap buildup in the tub or shower regularly.  Attach bath mats securely with double-sided non-slip rug tape.  Do not have throw rugs and other things on the floor that can make you trip. What can I do in the bedroom?  Use night lights.  Make sure that you have a light by your bed that is easy to reach.  Do not use any sheets or blankets that are too big for your bed. They should not hang down onto the floor.  Have a firm chair that has side arms. You can use this for support while you get dressed.  Do not  have throw rugs and other things on the floor that can make you trip. What can I do in the kitchen?  Clean up any spills right away.  Avoid walking on wet floors.  Keep items that you use a lot in easy-to-reach places.  If you need to reach something above you, use a strong step stool that has a grab bar.  Keep electrical cords out of the way.  Do not use floor polish or wax that makes floors slippery. If you must use wax, use non-skid floor wax.  Do not have throw rugs and other things on the floor that can make you trip. What can I do with my stairs?  Do not leave any items on the stairs.  Make sure that there are handrails on both sides of the stairs and use them. Fix handrails that  are broken or loose. Make sure that handrails are as long as the stairways.  Check any carpeting to make sure that it is firmly attached to the stairs. Fix any carpet that is loose or worn.  Avoid having throw rugs at the top or bottom of the stairs. If you do have throw rugs, attach them to the floor with carpet tape.  Make sure that you have a light switch at the top of the stairs and the bottom of the stairs. If you do not have them, ask someone to add them for you. What else can I do to help prevent falls?  Wear shoes that:  Do not have high heels.  Have rubber bottoms.  Are comfortable and fit you well.  Are closed at the toe. Do not wear sandals.  If you use a stepladder:  Make sure that it is fully opened. Do not climb a closed stepladder.  Make sure that both sides of the stepladder are locked into place.  Ask someone to hold it for you, if possible.  Clearly mark and make sure that you can see:  Any grab bars or handrails.  First and last steps.  Where the edge of each step is.  Use tools that help you move around (mobility aids) if they are needed. These include:  Canes.  Walkers.  Scooters.  Crutches.  Turn on the lights when you go into a dark area. Replace any light bulbs as soon as they burn out.  Set up your furniture so you have a clear path. Avoid moving your furniture around.  If any of your floors are uneven, fix them.  If there are any pets around you, be aware of where they are.  Review your medicines with your doctor. Some medicines can make you feel dizzy. This can increase your chance of falling. Ask your doctor what other things that you can do to help prevent falls. This information is not intended to replace advice given to you by your health care provider. Make sure you discuss any questions you have with your health care provider. Document Released: 04/05/2009 Document Revised: 11/15/2015 Document Reviewed: 07/14/2014 Elsevier  Interactive Patient Education  2017 [employer].

## 2020-05-31 ENCOUNTER — Telehealth (INDEPENDENT_AMBULATORY_CARE_PROVIDER_SITE_OTHER): Payer: MEDICARE | Admitting: Cardiology

## 2020-05-31 ENCOUNTER — Encounter: Payer: Self-pay | Admitting: Internal Medicine

## 2020-05-31 ENCOUNTER — Other Ambulatory Visit: Payer: Self-pay

## 2020-05-31 ENCOUNTER — Ambulatory Visit (INDEPENDENT_AMBULATORY_CARE_PROVIDER_SITE_OTHER): Payer: MEDICARE | Admitting: Internal Medicine

## 2020-05-31 VITALS — BP 107/74 | HR 76 | Temp 97.5°F | Ht 71.0 in | Wt 299.0 lb

## 2020-05-31 VITALS — BP 148/82 | HR 85 | Temp 98.6°F | Ht 71.0 in | Wt 304.4 lb

## 2020-05-31 DIAGNOSIS — R600 Localized edema: Secondary | ICD-10-CM

## 2020-05-31 DIAGNOSIS — G8929 Other chronic pain: Secondary | ICD-10-CM | POA: Diagnosis not present

## 2020-05-31 DIAGNOSIS — U099 Post covid-19 condition, unspecified: Secondary | ICD-10-CM

## 2020-05-31 DIAGNOSIS — I872 Venous insufficiency (chronic) (peripheral): Secondary | ICD-10-CM | POA: Diagnosis not present

## 2020-05-31 DIAGNOSIS — M545 Low back pain, unspecified: Secondary | ICD-10-CM | POA: Diagnosis not present

## 2020-05-31 DIAGNOSIS — Z8616 Personal history of COVID-19: Secondary | ICD-10-CM | POA: Diagnosis not present

## 2020-05-31 DIAGNOSIS — Z86718 Personal history of other venous thrombosis and embolism: Secondary | ICD-10-CM

## 2020-05-31 DIAGNOSIS — E871 Hypo-osmolality and hyponatremia: Secondary | ICD-10-CM

## 2020-05-31 DIAGNOSIS — I10 Essential (primary) hypertension: Secondary | ICD-10-CM

## 2020-05-31 DIAGNOSIS — Z7901 Long term (current) use of anticoagulants: Secondary | ICD-10-CM

## 2020-05-31 DIAGNOSIS — U071 COVID-19: Secondary | ICD-10-CM | POA: Diagnosis not present

## 2020-05-31 DIAGNOSIS — D62 Acute posthemorrhagic anemia: Secondary | ICD-10-CM | POA: Diagnosis not present

## 2020-05-31 DIAGNOSIS — E538 Deficiency of other specified B group vitamins: Secondary | ICD-10-CM | POA: Diagnosis not present

## 2020-05-31 DIAGNOSIS — J1282 Pneumonia due to coronavirus disease 2019: Secondary | ICD-10-CM | POA: Diagnosis not present

## 2020-05-31 DIAGNOSIS — R911 Solitary pulmonary nodule: Secondary | ICD-10-CM | POA: Diagnosis not present

## 2020-05-31 DIAGNOSIS — R7989 Other specified abnormal findings of blood chemistry: Secondary | ICD-10-CM | POA: Diagnosis not present

## 2020-05-31 DIAGNOSIS — I4819 Other persistent atrial fibrillation: Secondary | ICD-10-CM

## 2020-05-31 DIAGNOSIS — I4891 Unspecified atrial fibrillation: Secondary | ICD-10-CM | POA: Diagnosis not present

## 2020-05-31 LAB — COMPREHENSIVE METABOLIC PANEL
ALT: 17 U/L (ref 0–53)
AST: 18 U/L (ref 0–37)
Albumin: 3.9 g/dL (ref 3.5–5.2)
Alkaline Phosphatase: 63 U/L (ref 39–117)
BUN: 15 mg/dL (ref 6–23)
CO2: 29 mEq/L (ref 19–32)
Calcium: 9.4 mg/dL (ref 8.4–10.5)
Chloride: 101 mEq/L (ref 96–112)
Creatinine, Ser: 1.05 mg/dL (ref 0.40–1.50)
GFR: 72.84 mL/min (ref >60.00)
Glucose, Bld: 101 mg/dL — ABNORMAL HIGH (ref 70–99)
Potassium: 3.9 mEq/L (ref 3.5–5.1)
Sodium: 140 mEq/L (ref 135–145)
Total Bilirubin: 0.6 mg/dL (ref 0.2–1.2)
Total Protein: 8.2 g/dL (ref 6.0–8.3)

## 2020-05-31 LAB — CBC WITH DIFFERENTIAL/PLATELET
Basophils Absolute: 0.1 10*3/uL (ref 0.0–0.1)
Basophils Relative: 1.1 % (ref 0.0–3.0)
Eosinophils Absolute: 0.8 10*3/uL — ABNORMAL HIGH (ref 0.0–0.7)
Eosinophils Relative: 6.7 % — ABNORMAL HIGH (ref 0.0–5.0)
HCT: 42.2 % (ref 39.0–52.0)
Hemoglobin: 14 g/dL (ref 13.0–17.0)
Lymphocytes Relative: 19.6 % (ref 12.0–46.0)
Lymphs Abs: 2.3 10*3/uL (ref 0.7–4.0)
MCHC: 33.3 g/dL (ref 30.0–36.0)
MCV: 96 fl (ref 78.0–100.0)
Monocytes Absolute: 1.1 10*3/uL — ABNORMAL HIGH (ref 0.1–1.0)
Monocytes Relative: 9.7 % (ref 3.0–12.0)
Neutro Abs: 7.3 10*3/uL (ref 1.4–7.7)
Neutrophils Relative %: 62.9 % (ref 43.0–77.0)
Platelets: 330 10*3/uL (ref 150.0–400.0)
RBC: 4.39 Mil/uL (ref 4.22–5.81)
RDW: 16.1 % — ABNORMAL HIGH (ref 11.5–15.5)
WBC: 11.6 10*3/uL — ABNORMAL HIGH (ref 4.0–10.5)

## 2020-05-31 MED ORDER — DOXYCYCLINE HYCLATE 100 MG PO TABS: 100.0000 mg | ORAL_TABLET | Freq: Two times a day (BID) | ORAL | 0 refills | Status: DC

## 2020-05-31 NOTE — Assessment & Plan Note (Signed)
Losartan 

## 2020-05-31 NOTE — Addendum Note (Signed)
Addended by: Trenda Moots on: 39/08/5938 11:32 AM   Modules accepted: Orders

## 2020-05-31 NOTE — Assessment & Plan Note (Signed)
Taste changes, fatigue Discussed

## 2020-05-31 NOTE — Addendum Note (Signed)
Addended by: Cassandria Anger on: 05/31/2020 11:37 AM   Modules accepted: Orders

## 2020-05-31 NOTE — Assessment & Plan Note (Signed)
On B12 

## 2020-05-31 NOTE — Assessment & Plan Note (Signed)
Try Lion's mane 

## 2020-05-31 NOTE — Assessment & Plan Note (Signed)
Better w/wt loss If needs SCD - [employer]

## 2020-05-31 NOTE — Patient Instructions (Signed)
   B-complex with Niacin 100 mg    Lion's mane  

## 2020-05-31 NOTE — Progress Notes (Signed)
Virtual Visit via Telephone Note   This visit type was conducted due to national recommendations for restrictions regarding the COVID-19 Pandemic (e.g. social distancing) in an effort to limit this patient's exposure and mitigate transmission in our community.  Due to his co-morbid illnesses, this patient is at least at moderate risk for complications without adequate follow up.  This format is felt to be most appropriate for this patient at this time.  The patient did not have access to video technology/had technical difficulties with video requiring transitioning to audio format only (telephone).  All issues noted in this document were discussed and addressed.  No physical exam could be performed with this format.  Please refer to the patient's chart for his  consent to telehealth for [facility].   The patient was identified using 2 identifiers.  Patient Location: Home Provider Location: Home Office  Date:  05/31/2020   ID:  Robert [name], DOB 02/27/1951, MRN [id number]  PCP:  Cassandria Anger, MD  Cardiologist:  Buford Dresser, MD PhD (previously seen by Dr. Johnsie Cancel)  Referring MD: Cassandria Anger, MD   CC: follow up  History of Present Illness:    Robert [name] is a 69 y.o. male with a hx of severe Covid infection requiring prolonged hospitalization, atrial fibrillation (persistent, started with Covid), prior DVT/PE, dyslipidemia, obesity, paroxysmal SVT who is seen in follow up at the request of [name], [name], MD for the evaluation and management of arrhythmia. He was previously seen by cardiology in 04/2014 by Dr. Johnsie Cancel. His initial consult with me was on 04/23/18. Please see that note for full review of his history.  Today: Slowly improving but not near baseline. Can now walk about 200 ft. Trying to do home PT exercises. Sleeping in a hospital bed. Still using oxygen at night his his O2 sats are still dropping. He is usually running 94-97% off O2  while sitting during the day. Cold air triggers shortness of breath. Still has abnormal taste, especially with liquids.   Has appt with Dr. Alain Marion later today. Has been getting mixed information on vaccination post Covid infection, will defer to whatever Dr. Alain Marion says. We reviewed the way that amiodarone and coumadin affect each other, and he has seen that his coumadin has needed to be reduced since starting amiodarone.  HR has been variable. With exertion can be 120-130 bpm, but while sitting can be around 70 bpm.  Past Medical History:  Diagnosis Date  . Anxiety   . Asthma    "no attack since acupuncture in 1990's"  . Complication of anesthesia    SLOW TO WAKE UP / DIFFICULTY RESPONDING 2003, 3 days before could use left arm, "wild/crazy sometimes"  . Cyst of left kidney    "benign"  . DVT (deep venous thrombosis) ([place]) 2003   right femoral artery "from groin to knee"  . Edema    Right Leg w/ h/o DVT postphlebitic  . H/O blood transfusion reaction 2004   FFP, extreme swelling, could not breath  . Headache(784.0)    MIGRAINES-not for a long time  . History of pulmonary embolism 2003   both lungs  . Hyperlipidemia   . Knee pain    left  . LBP (low back pain)   . 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 Obesity   . Osteoarthritis   . Peripheral vascular disease ([place])    "poor circulation in  RT leg"  . PONV (  postoperative nausea and vomiting)    during surgery in 1990's  . Spondylolisthesis   . Warfarin anticoagulation   . Wheezing    when laying on left side    Past Surgical History:  Procedure Laterality Date  . APPENDECTOMY  1993   Dr Margot Chimes  . CHOLECYSTECTOMY  1993  . FINGER SURGERY  2012   RT THUMB RECONSTRUCTION  . HAND SURGERY Right    abcess  . HEMORROIDECTOMY  1981  . I & D KNEE WITH POLY EXCHANGE Left 04/04/2013   Procedure: IRRIGATION AND DEBRIDEMENT KNEE WITH POLY EXCHANGE AND INSERTION OF CEMENT BEADS;  Surgeon: Gearlean Alf, MD;  Location: [facility];  Service: Orthopedics;  Laterality: Left;  . KNEE ARTHROSCOPY  1990   RT KNEE  . KNEE ARTHROSCOPY Right 2003  . KNEE ARTHROSCOPY  1988   L KNEE  . KNEE ARTHROSCOPY WITH MENISCAL REPAIR Left 10/22/2012   Procedure: LEFT KNEE ARTHROSCOPY PARTIAL MEDIAL AND LATERAL MENISCECTOMY AND DEBRIDEMENT, CHONDROPLASTY ;  Surgeon: Johnn Hai, MD;  Location: [facility];  Service: Orthopedics;  Laterality: Left;  . LUMBAR DISC SURGERY    . LUMBAR FUSION  2003   Dr Maxie Better  . ORCHIECTOMY  1994   R post injury  . TONSILLECTOMY  1967  . TOTAL KNEE ARTHROPLASTY Left 03/21/2013   Procedure: LEFT TOTAL KNEE ARTHROPLASTY;  Surgeon: Gearlean Alf, MD;  Location: [facility];  Service: Orthopedics;  Laterality: Left;    Current Medications: Current Outpatient Medications on File Prior to Visit  Medication Sig  . albuterol (PROVENTIL) (2.5 MG/3ML) 0.083% nebulizer solution Take 3 mLs (2.5 mg total) by nebulization every 4 (four) hours as needed for wheezing or shortness of breath. Dx  J45.909  . albuterol (VENTOLIN HFA) 108 (90 Base) MCG/ACT inhaler Inhale 2 puffs into the lungs every 6 (six) hours as needed for wheezing or shortness of breath.  [name] amiodarone (PACERONE) 200 MG tablet Take 1 tablet (200 mg total) by mouth daily.  . benzonatate (TESSALON PERLES) 100 MG capsule Take 1 capsule (100 mg total) by mouth every 6 (six) hours as needed for cough.  . Cholecalciferol (VITAMIN D3) 125 MCG (5000 UT) CAPS Take 1 capsule (5,000 Units total) by mouth daily.  . famotidine (PEPCID) 20 MG tablet Take 1 tablet (20 mg total) by mouth daily.  . fish oil-omega-3 fatty acids 1000 MG capsule Take 2 g by mouth every morning.  . furosemide (LASIX) 20 MG tablet Take 1-2 tablets (20-40 mg total) by mouth daily.  [name] glucosamine-chondroitin 500-400 MG tablet Take 1 tablet by mouth every morning.  . nortriptyline (PAMELOR) 10 MG capsule Take 1 capsule (10 mg total) by mouth every evening.  . predniSONE  (DELTASONE) 5 MG tablet Take 1 tablet (5 mg total) by mouth daily with breakfast. Continue your previous home dose 5 mg of prednisone daily unchanged as before.  . warfarin (COUMADIN) 3 MG tablet Take 3 mg by mouth daily.   No current facility-administered medications on file prior to visit.     Allergies:   Cat hair extract, Diltiazem, Sulfa antibiotics, Anoro ellipta [umeclidinium-vilanterol], Breo ellipta [fluticasone furoate-vilanterol], Exalgo [hydromorphone hcl], Fentanyl, Gabapentin, Hydrocodone-acetaminophen, Lopressor [metoprolol tartrate], Penicillins, Sulfonamide derivatives, and Tylenol [acetaminophen]   Social History   Tobacco Use  . Smoking status: Never Smoker  . Smokeless tobacco: Never Used  Vaping Use  . Vaping Use: Never used  Substance Use Topics  . Alcohol use: No  . Drug use: No  Family History: The patient's family history includes Heart disease in his mother and sister; Hyperlipidemia in an other family member; Hypertension in an other family member; Parkinsonism in an other family member. son passed away with hard heartbeats. Mother had 5V CABG about 10 years ago, had to have a pacemaker placed.  ROS:   Please see the history of present illness.  Additional pertinent ROS otherwise unremarkable.  EKGs/Labs/Other Studies Reviewed:    The following studies were reviewed today: Echo 03/24/20 1. Left ventricular ejection fraction, by estimation, is 55 to 60%. The  left ventricle has normal function. The left ventricle has no regional  wall motion abnormalities. Left ventricular diastolic parameters are  indeterminate.  2. Right ventricular systolic function is normal. The right ventricular  size is normal.  3. Left atrial size was mild to moderately dilated.  4. The mitral valve is normal in structure. No evidence of mitral valve  regurgitation. No evidence of mitral stenosis.  5. The aortic valve has an indeterminant number of cusps. There is   moderate calcification of the aortic valve. There is moderate thickening  of the aortic valve. Aortic valve regurgitation is not visualized. Mild to  moderate aortic valve stenosis.Aortic  valve mean gradient measures 19.3 mmHg. Aortic valve peak gradient  measures 32.1 mmHg. Aortic valve area, by VTI measures 1.26 cm.   Echo 05/06/18 - Left ventricle: The cavity size was normal. Systolic function was   normal. The estimated ejection fraction was in the range of 60%   to 65%. Wall motion was normal; there were no regional wall   motion abnormalities. - Aortic valve: There was very mild stenosis. Valve area (VTI):   1.67 cm^2. Valve area (Vmax): 1.85 cm^2. Valve area (Vmean): 1.59   cm^2. - Mitral valve: There was mild regurgitation. - Left atrium: The atrium was mildly to moderately dilated.  Impressions: - Left ventricular systolic function is preserved visually   estimated ejection fraction 60 to 65%.   Mild mitral regurgitation.   Mild to moderate left atrial enlargement   Very mild aortic stenosis.  Monitor 05/27/18 Patient had a min HR of 53 bpm, max HR of 214 bpm, and avg HR of 89 bpm. Predominant underlying rhythm was Sinus Rhythm. There were 9 patient triggered events, and Supraventricular Tachycardia was detected within +/- 45 seconds of symptomatic patient event(s).   Monitor noted 6 episodes of wide complex tachycardia; these are labeled as VT but are different morphology from the majority of his PVCs and bigeminy/trigeminy. Could be VT vs. SVT with aberrancy, given that these events occurred at a faster heart rate than his predominant SVT. The fastest WCT lasted 15 beats with a max rate of 197 bpm (avg 164 bpm); the run with the fastest interval was also the longest.   4752 narrow complex Supraventricular Tachycardia runs occurred, the run with the fastest interval lasting 1 min 37 secs with a max rate of 214 bpm, the longest lasting 56 mins 49 secs with an avg rate of  126 bpm. Some episodes of Supraventricular Tachycardia conducted with possible aberrancy. Isolated SVEs were frequent (10.9%, [id number]), SVE Couplets were rare (<1.0%, 7844), and SVE Triplets were rare (<1.0%, 793). Isolated VEs were occasional (2.1%, [id number]), VE Couplets were rare (<1.0%, 338), and VE Triplets were rare (<1.0%, 5). Ventricular Bigeminy and Trigeminy were present.  Echo stress 03/27/11 - Stress ECG conclusions: The stress ECG was normal. - Staged echo: There was no echocardiographic evidence for stress-induced ischemia. Impressions:  -  Stress echo with chest tightness but no ST changes; no stress-induced wall motion abnormalities  EKG:  ECG personally reviewed. The ekg ordered 04/02/20 demonstrates atrial fibrillation at 99 bpm  Recent Labs: 03/26/2020: TSH 1.169 04/04/2020: ALT 77; B Natriuretic Peptide 107.7; BUN 25; Creatinine, Ser 0.98; Hemoglobin 14.7; Magnesium 2.1; Platelets 263; Potassium 4.7; Sodium 135  Recent Lipid Panel    Component Value Date/Time   CHOL 208 (H) 03/30/2019 0742   TRIG 112 03/23/2020 0703   TRIG 118 04/30/2006 0841   HDL 34.40 (L) 03/30/2019 0742   CHOLHDL 6 03/30/2019 0742   VLDL 33.0 03/30/2019 0742   LDLCALC 141 (H) 03/30/2019 0742   LDLDIRECT 137.0 03/10/2017 0842    Physical Exam:    VS:  BP 107/74   Pulse 76   Temp (!) 97.5 F (36.4 C)   Ht 5\' 11"  (1.803 m)   Wt 299 lb (135.6 kg)   SpO2 94%   BMI 41.70 kg/m     Wt Readings from Last 3 Encounters:  05/31/20 299 lb (135.6 kg)  05/28/20 294 lb 8 oz (133.6 kg)  04/27/20 296 lb (134.3 kg)    Speaking comfortably on the phone, no audible wheezing but is conversationally dyspneic after speaking In no acute distress Alert and oriented Normal affect Normal speech  ASSESSMENT:    1. Personal history of COVID-19   2. Persistent atrial fibrillation ([place])   3. Long term current use of anticoagulant   4. History of DVT of lower extremity   5. Chronic venous insufficiency    6. Essential hypertension    PLAN:    Recent prolonged Covid infection/hospitalization: -has been a slow recovery process for him -we discussed at length the long term effects Covid can have on the body  Atrial fibrillation, persistent: -he remains in atrial fibrillation -difficult situation, as he cannot tolerate diltiazem or beta blockers -on amiodarone for rate control -chadsvasc=4 -was already on long term coumadin, but discussed how amiodarone can affect INR on coumadin -discussed cardioversion, but given that he is still ill I do not think it would be likely to hold at this time. Will continue to discuss -has declined sleep study in the past.  Hypertension: running low since Covid, monitor  History of DVT/PE with chronic venous insufficiency, on long term anticoagulation with coumadin: No changes, not interested in DOAC, continue coumadin therapy.  -Uses compression stocking for insufficiency  Dyslipidemia: last LDL 151, TG 181. We have discussed at length options for management. He prefers to stay on OTC fish oil. Has significantly elevated ASCVD risk, below  Obesity: BMI 41. Lost some weight with Covid, limited mobility currently for health activity/weight loss  ASCVD risk score: The 10-year ASCVD risk score Mikey Bussing DC Brooke Bonito., et al., 2013) is: 16.6%   Values used to calculate the score:     Age: 66 years     Sex: Male     Is Non-Hispanic African American: No     Diabetic: No     Tobacco smoker: No     Systolic Blood Pressure: 841 mmHg     Is BP treated: Yes     HDL Cholesterol: 34.4 mg/dL     Total Cholesterol: 208 mg/dL   Plan for follow up: 6 weeks, in person  Today, I have spent 21 minutes with the patient with telehealth technology discussing the above problems.  Additional time spent in chart review, documentation, and communication.  Medication Adjustments/Labs and Tests Ordered: Current medicines are reviewed at length  with the patient today.  Concerns  regarding medicines are outlined above.  No orders of the defined types were placed in this encounter.  No orders of the defined types were placed in this encounter.   Patient Instructions  Medication Instructions:  Your Physician recommend you continue on your current medication as directed.    *If you need a refill on your cardiac medications before your next appointment, please call your pharmacy*   Lab Work: None   Testing/Procedures: None   Follow-Up: At [facility], you and your health needs are our priority.  As part of our continuing mission to provide you with exceptional heart care, we have created designated Provider Care Teams.  These Care Teams include your primary Cardiologist (physician) and Advanced Practice Providers (APPs -  Physician Assistants and Nurse Practitioners) who all work together to provide you with the care you need, when you need it.  We recommend signing up for the patient portal called "MyChart".  Sign up information is provided on this After Visit Summary.  MyChart is used to connect with patients for Virtual Visits (Telemedicine).  Patients are able to view lab/test results, encounter notes, upcoming appointments, etc.  Non-urgent messages can be sent to your provider as well.   To learn more about what you can do with MyChart, go to [email or web address].    Your next appointment:   6 week(s)  The format for your next appointment:   In Person  Provider:   Buford Dresser, MD      Signed, Buford Dresser, MD PhD 05/31/2020    [place]

## 2020-05-31 NOTE — Progress Notes (Addendum)
Subjective:  Patient ID: DAIVD FREDERICKSEN, male    DOB: 1950/09/02  Age: 69 y.o. MRN: [id number]  CC: Annual Exam   HPI KYLIE GROS presents for a post-COVID issues visit, asthma, LBP f/u C/o R sinusitis sx's  Outpatient Medications Prior to Visit  Medication Sig Dispense Refill  . albuterol (PROVENTIL) (2.5 MG/3ML) 0.083% nebulizer solution Take 3 mLs (2.5 mg total) by nebulization every 4 (four) hours as needed for wheezing or shortness of breath. Dx  J45.909 150 mL 3  . albuterol (VENTOLIN HFA) 108 (90 Base) MCG/ACT inhaler Inhale 2 puffs into the lungs every 6 (six) hours as needed for wheezing or shortness of breath. 6.7 g 0  . amiodarone (PACERONE) 200 MG tablet Take 1 tablet (200 mg total) by mouth daily. 90 tablet 3  . Cholecalciferol (VITAMIN D3) 125 MCG (5000 UT) CAPS Take 1 capsule (5,000 Units total) by mouth daily. 100 capsule 3  . fish oil-omega-3 fatty acids 1000 MG capsule Take 2 g by mouth every morning.    . furosemide (LASIX) 20 MG tablet Take 1-2 tablets (20-40 mg total) by mouth daily. 90 tablet 3  . glucosamine-chondroitin 500-400 MG tablet Take 1 tablet by mouth every morning.    . nortriptyline (PAMELOR) 10 MG capsule Take 1 capsule (10 mg total) by mouth every evening. 90 capsule 3  . predniSONE (DELTASONE) 5 MG tablet Take 1 tablet (5 mg total) by mouth daily with breakfast. Continue your previous home dose 5 mg of prednisone daily unchanged as before. 30 tablet 0  . warfarin (COUMADIN) 3 MG tablet Take 3 mg by mouth daily.    . benzonatate (TESSALON PERLES) 100 MG capsule Take 1 capsule (100 mg total) by mouth every 6 (six) hours as needed for cough. (Patient not taking: Reported on 05/31/2020) 30 capsule 0  . famotidine (PEPCID) 20 MG tablet Take 1 tablet (20 mg total) by mouth daily. 30 tablet 0   No facility-administered medications prior to visit.    ROS: Review of Systems  Constitutional: Positive for fatigue. Negative for appetite change and  unexpected weight change.  HENT: Negative for congestion, nosebleeds, sneezing, sore throat and trouble swallowing.   Eyes: Negative for itching and visual disturbance.  Respiratory: Negative for cough.   Cardiovascular: Negative for chest pain, palpitations and leg swelling.  Gastrointestinal: Negative for abdominal distention, blood in stool, diarrhea and nausea.  Genitourinary: Negative for frequency and hematuria.  Musculoskeletal: Positive for arthralgias, back pain and gait problem. Negative for joint swelling and neck pain.  Skin: Negative for rash and wound.  Neurological: Positive for weakness. Negative for dizziness, tremors and speech difficulty.  Psychiatric/Behavioral: Negative for agitation, dysphoric mood and sleep disturbance. The patient is nervous/anxious.     Objective:  BP (!) 148/82 (BP Location: Left Arm)   Pulse 85   Temp 98.6 F (37 C) (Oral)   Ht 5\' 11"  (1.803 m)   Wt (!) 304 lb 6.4 oz (138.1 kg)   SpO2 95%   BMI 42.46 kg/m   BP Readings from Last 3 Encounters:  05/31/20 (!) 148/82  05/31/20 107/74  05/28/20 128/74    Wt Readings from Last 3 Encounters:  05/31/20 (!) 304 lb 6.4 oz (138.1 kg)  05/31/20 299 lb (135.6 kg)  05/28/20 294 lb 8 oz (133.6 kg)    Physical Exam Constitutional:      General: He is not in acute distress.    Appearance: He is well-developed.     Comments:  NAD  HENT:     Mouth/Throat:     Mouth: Oropharynx is clear and moist.  Eyes:     Conjunctiva/sclera: Conjunctivae normal.     Pupils: Pupils are equal, round, and reactive to light.  Neck:     Thyroid: No thyromegaly.     Vascular: No JVD.  Cardiovascular:     Rate and Rhythm: Normal rate and regular rhythm.     Pulses: Intact distal pulses.     Heart sounds: Normal heart sounds. No murmur heard. No friction rub. No gallop.   Pulmonary:     Effort: Pulmonary effort is normal. No respiratory distress.     Breath sounds: Normal breath sounds. No wheezing or rales.   Chest:     Chest wall: No tenderness.  Abdominal:     General: Bowel sounds are normal. There is no distension.     Palpations: Abdomen is soft. There is no mass.     Tenderness: There is no abdominal tenderness. There is no guarding or rebound.  Musculoskeletal:        General: No tenderness or edema. Normal range of motion.     Cervical back: Normal range of motion.  Lymphadenopathy:     Cervical: No cervical adenopathy.  Skin:    General: Skin is warm and dry.     Findings: No rash.  Neurological:     Mental Status: He is alert and oriented to person, place, and time.     Cranial Nerves: No cranial nerve deficit.     Motor: No abnormal muscle tone.     Coordination: He displays a negative Romberg sign. Coordination normal.     Gait: Gait normal.     Deep Tendon Reflexes: Reflexes are normal and symmetric.  Psychiatric:        Mood and Affect: Mood and affect normal.        Behavior: Behavior normal.        Thought Content: Thought content normal.        Judgment: Judgment normal.     Lab Results  Component Value Date   WBC 15.8 (H) 04/04/2020   HGB 14.7 04/04/2020   HCT 42.7 04/04/2020   PLT 263 04/04/2020   GLUCOSE 127 (H) 04/04/2020   CHOL 208 (H) 03/30/2019   TRIG 112 03/23/2020   HDL 34.40 (L) 03/30/2019   LDLDIRECT 137.0 03/10/2017   LDLCALC 141 (H) 03/30/2019   ALT 77 (H) 04/04/2020   AST 25 04/04/2020   NA 135 04/04/2020   K 4.7 04/04/2020   CL 100 04/04/2020   CREATININE 0.98 04/04/2020   BUN 25 (H) 04/04/2020   CO2 25 04/04/2020   TSH 1.169 03/26/2020   PSA 0.09 (L) 03/30/2019   INR 2.6 05/21/2020   HGBA1C 6.0 12/21/2019    CT ANGIO CHEST PE W OR WO CONTRAST  Result Date: 03/23/2020 CLINICAL DATA:  Shortness of breath. COVID positive, recent hospitalization post discharge yesterday. EXAM: CT ANGIOGRAPHY CHEST WITH CONTRAST TECHNIQUE: Multidetector CT imaging of the chest was performed using the standard protocol during bolus administration of  intravenous contrast. Multiplanar CT image reconstructions and MIPs were obtained to evaluate the vascular anatomy. CONTRAST:  167mL OMNIPAQUE IOHEXOL 350 MG/ML SOLN COMPARISON:  Radiograph earlier today.  Chest CTA 07/04/2013 FINDINGS: Cardiovascular: There are no filling defects within the pulmonary arteries to suggest pulmonary embolus. There is significant breathing motion artifact, evaluation is diagnostic to the proximal segmental level. Cannot assess distal segmental and subsegmental branches. Thoracic aorta  is normal in caliber. There is no aortic dissection. Moderate aortic atherosclerosis. Borderline cardiomegaly. There mitral annulus calcifications. Coronary artery calcifications. No pericardial effusion. Small amount of contrast refluxes into the hepatic veins and IVC. Mediastinum/Nodes: Small hiatal hernia. Small paratracheal and right infrahilar nodes not enlarged by size criteria. No suspicious thyroid nodule. No pneumomediastinum. Lungs/Pleura: Multifocal patchy and geographic ground-glass opacities within all lobes of both lungs. Some of these opacities appear coalescing peripherally. No convincing septal thickening or pulmonary edema. No pneumothorax. Trachea and central bronchi are patent. No pleural fluid. The previous right lower lobe pulmonary nodule is obscured by opacities on the current exam. Upper Abdomen: Minimal contrast refluxing into the hepatic veins and IVC. Suspected hepatic steatosis. Cholecystectomy. Multiple cysts in the left kidney, incompletely characterized on this chest CTA, but grossly stable from 2015. Musculoskeletal: Multilevel degenerative change throughout the spine. There are no acute or suspicious osseous abnormalities. Review of the MIP images confirms the above findings. IMPRESSION: 1. No pulmonary embolus allowing for significant breathing motion artifact. 2. Multifocal patchy and geographic ground-glass opacities within all lobes of both lungs, pattern consistent  with COVID-19 pneumonia. 3. Small amount of contrast refluxing into the hepatic veins and IVC, suggesting elevated right heart pressures. 4. Small hiatal hernia. 5. Aortic atherosclerosis and coronary artery calcifications. Aortic Atherosclerosis (ICD10-I70.0). Electronically Signed   By: Keith Rake M.D.   On: 03/23/2020 17:05   DG Chest Portable 1 View  Result Date: 03/23/2020 CLINICAL DATA:  Shortness of breath EXAM: PORTABLE CHEST 1 VIEW COMPARISON:  03/17/2020 FINDINGS: Cardiomegaly. Bilateral indistinct airspace opacities that have progressed. No clear Kerley lines. No pleural effusion or pneumothorax. IMPRESSION: Progressive pulmonary infiltrates. Electronically Signed   By: Monte Fantasia M.D.   On: 03/23/2020 07:18   ECHOCARDIOGRAM COMPLETE  Result Date: 03/24/2020    ECHOCARDIOGRAM REPORT   Patient Name:   BRYAR DAHMS Date of Exam: 03/24/2020 Medical Rec #:  [id number]         Height:       71.0 in Accession #:    [id number]        Weight:       324.7 lb Date of Birth:  August 14, 1950         BSA:          2.591 m Patient Age:    73 years          BP:           140/101 mmHg Patient Gender: M                 HR:           115 bpm. Exam Location:  Inpatient Procedure: 2D Echo, Cardiac Doppler and Color Doppler Indications:     I42.9 Cardiomyopathy (unspecified)  History:         Patient has prior history of Echocardiogram examinations, most                  recent 05/06/2018. Risk Factors:Morbid Obesity. Covid 19                  positive.  Sonographer:     Merrie Roof RDCS Referring Phys:  [place] Diagnosing Phys: Carlyle Dolly MD IMPRESSIONS  1. Left ventricular ejection fraction, by estimation, is 55 to 60%. The left ventricle has normal function. The left ventricle has no regional wall motion abnormalities. Left ventricular diastolic parameters are indeterminate.  2. Right ventricular systolic function is normal. The  right ventricular size is normal.  3. Left atrial size was  mild to moderately dilated.  4. The mitral valve is normal in structure. No evidence of mitral valve regurgitation. No evidence of mitral stenosis.  5. The aortic valve has an indeterminant number of cusps. There is moderate calcification of the aortic valve. There is moderate thickening of the aortic valve. Aortic valve regurgitation is not visualized. Mild to moderate aortic valve stenosis.Aortic valve mean gradient measures 19.3 mmHg. Aortic valve peak gradient measures 32.1 mmHg. Aortic valve area, by VTI measures 1.26 cm. FINDINGS  Left Ventricle: Left ventricular ejection fraction, by estimation, is 55 to 60%. The left ventricle has normal function. The left ventricle has no regional wall motion abnormalities. The left ventricular internal cavity size was normal in size. There is  no left ventricular hypertrophy. Left ventricular diastolic parameters are indeterminate. Right Ventricle: The right ventricular size is normal. No increase in right ventricular wall thickness. Right ventricular systolic function is normal. Left Atrium: Left atrial size was mild to moderately dilated. Right Atrium: Right atrial size was normal in size. Pericardium: There is no evidence of pericardial effusion. Mitral Valve: The mitral valve is normal in structure. No evidence of mitral valve regurgitation. No evidence of mitral valve stenosis. Tricuspid Valve: The tricuspid valve is normal in structure. Tricuspid valve regurgitation is not demonstrated. No evidence of tricuspid stenosis. Aortic Valve: The aortic valve has an indeterminant number of cusps. There is moderate calcification of the aortic valve. There is moderate thickening of the aortic valve. There is moderate aortic valve annular calcification. Aortic valve regurgitation is not visualized. Mild to moderate aortic stenosis is present. Aortic valve mean gradient measures 19.3 mmHg. Aortic valve peak gradient measures 32.1 mmHg. Aortic valve area, by VTI measures 1.26  cm. Pulmonic Valve: The pulmonic valve was not well visualized. Pulmonic valve regurgitation is not visualized. No evidence of pulmonic stenosis. Aorta: The aortic root is normal in size and structure. Pulmonary Artery: Indeterminant PASP, inadequate TR jet. IAS/Shunts: The interatrial septum was not well visualized.  LEFT VENTRICLE PLAX 2D LVIDd:         4.70 cm LVIDs:         3.60 cm LV PW:         1.00 cm LV IVS:        0.90 cm LVOT diam:     2.10 cm LV SV:         64 LV SV Index:   25 LVOT Area:     3.46 cm  LV Volumes (MOD) LV vol d, MOD A4C: 86.8 ml LV vol s, MOD A4C: 46.1 ml LV SV MOD A4C:     86.8 ml RIGHT VENTRICLE RV Basal diam:  4.10 cm RV Mid diam:    3.40 cm RV S prime:     14.70 cm/s TAPSE (M-mode): 1.8 cm LEFT ATRIUM            Index       RIGHT ATRIUM           Index LA diam:      4.90 cm  1.89 cm/m  RA Area:     23.50 cm LA Vol (A2C): 103.0 ml 39.75 ml/m RA Volume:   60.10 ml  23.20 ml/m LA Vol (A4C): 82.2 ml  31.73 ml/m  AORTIC VALVE AV Area (Vmax):    1.34 cm AV Area (Vmean):   1.21 cm AV Area (VTI):     1.26 cm AV Vmax:  283.33 cm/s AV Vmean:          208.667 cm/s AV VTI:            0.505 m AV Peak Grad:      32.1 mmHg AV Mean Grad:      19.3 mmHg LVOT Vmax:         110.00 cm/s LVOT Vmean:        73.100 cm/s LVOT VTI:          0.184 m LVOT/AV VTI ratio: 0.36  AORTA Ao Root diam: 3.80 cm  SHUNTS Systemic VTI:  0.18 m Systemic Diam: 2.10 cm Carlyle Dolly MD Electronically signed by Carlyle Dolly MD Signature Date/Time: 03/24/2020/3:47:49 PM    Final (Updated)     Assessment & Plan:    Walker Kehr, MD

## 2020-05-31 NOTE — Patient Instructions (Signed)
Medication Instructions:  Your Physician recommend you continue on your current medication as directed.    *If you need a refill on your cardiac medications before your next appointment, please call your pharmacy*   Lab Work: None   Testing/Procedures: None   Follow-Up: At [facility], you and your health needs are our priority.  As part of our continuing mission to provide you with exceptional heart care, we have created designated Provider Care Teams.  These Care Teams include your primary Cardiologist (physician) and Advanced Practice Providers (APPs -  Physician Assistants and Nurse Practitioners) who all work together to provide you with the care you need, when you need it.  We recommend signing up for the patient portal called "MyChart".  Sign up information is provided on this After Visit Summary.  MyChart is used to connect with patients for Virtual Visits (Telemedicine).  Patients are able to view lab/test results, encounter notes, upcoming appointments, etc.  Non-urgent messages can be sent to your provider as well.   To learn more about what you can do with MyChart, go to [email or web address].    Your next appointment:   6 week(s)  The format for your next appointment:   In Person  Provider:   Buford Dresser, MD

## 2020-06-01 DIAGNOSIS — J1282 Pneumonia due to coronavirus disease 2019: Secondary | ICD-10-CM | POA: Diagnosis not present

## 2020-06-01 DIAGNOSIS — I4891 Unspecified atrial fibrillation: Secondary | ICD-10-CM | POA: Diagnosis not present

## 2020-06-01 DIAGNOSIS — U071 COVID-19: Secondary | ICD-10-CM | POA: Diagnosis not present

## 2020-06-01 DIAGNOSIS — R911 Solitary pulmonary nodule: Secondary | ICD-10-CM | POA: Diagnosis not present

## 2020-06-01 DIAGNOSIS — I872 Venous insufficiency (chronic) (peripheral): Secondary | ICD-10-CM | POA: Diagnosis not present

## 2020-06-04 DIAGNOSIS — M47896 Other spondylosis, lumbar region: Secondary | ICD-10-CM | POA: Diagnosis not present

## 2020-06-04 DIAGNOSIS — I10 Essential (primary) hypertension: Secondary | ICD-10-CM | POA: Diagnosis not present

## 2020-06-04 DIAGNOSIS — I4891 Unspecified atrial fibrillation: Secondary | ICD-10-CM | POA: Diagnosis not present

## 2020-06-04 DIAGNOSIS — Z7901 Long term (current) use of anticoagulants: Secondary | ICD-10-CM | POA: Diagnosis not present

## 2020-06-04 DIAGNOSIS — R911 Solitary pulmonary nodule: Secondary | ICD-10-CM | POA: Diagnosis not present

## 2020-06-04 DIAGNOSIS — Z5181 Encounter for therapeutic drug level monitoring: Secondary | ICD-10-CM | POA: Diagnosis not present

## 2020-06-04 DIAGNOSIS — J019 Acute sinusitis, unspecified: Secondary | ICD-10-CM | POA: Diagnosis not present

## 2020-06-04 DIAGNOSIS — Z8616 Personal history of COVID-19: Secondary | ICD-10-CM | POA: Diagnosis not present

## 2020-06-04 DIAGNOSIS — Z9981 Dependence on supplemental oxygen: Secondary | ICD-10-CM | POA: Diagnosis not present

## 2020-06-04 DIAGNOSIS — U071 COVID-19: Secondary | ICD-10-CM | POA: Diagnosis not present

## 2020-06-04 DIAGNOSIS — Z86718 Personal history of other venous thrombosis and embolism: Secondary | ICD-10-CM | POA: Diagnosis not present

## 2020-06-04 DIAGNOSIS — M6281 Muscle weakness (generalized): Secondary | ICD-10-CM | POA: Diagnosis not present

## 2020-06-04 DIAGNOSIS — I872 Venous insufficiency (chronic) (peripheral): Secondary | ICD-10-CM | POA: Diagnosis not present

## 2020-06-05 DIAGNOSIS — I4891 Unspecified atrial fibrillation: Secondary | ICD-10-CM | POA: Diagnosis not present

## 2020-06-05 DIAGNOSIS — J019 Acute sinusitis, unspecified: Secondary | ICD-10-CM | POA: Diagnosis not present

## 2020-06-05 DIAGNOSIS — I872 Venous insufficiency (chronic) (peripheral): Secondary | ICD-10-CM | POA: Diagnosis not present

## 2020-06-05 DIAGNOSIS — Z8616 Personal history of COVID-19: Secondary | ICD-10-CM | POA: Diagnosis not present

## 2020-06-05 DIAGNOSIS — M6281 Muscle weakness (generalized): Secondary | ICD-10-CM | POA: Diagnosis not present

## 2020-06-06 DIAGNOSIS — Z8616 Personal history of COVID-19: Secondary | ICD-10-CM | POA: Diagnosis not present

## 2020-06-06 DIAGNOSIS — I4891 Unspecified atrial fibrillation: Secondary | ICD-10-CM | POA: Diagnosis not present

## 2020-06-06 DIAGNOSIS — M6281 Muscle weakness (generalized): Secondary | ICD-10-CM | POA: Diagnosis not present

## 2020-06-06 DIAGNOSIS — J019 Acute sinusitis, unspecified: Secondary | ICD-10-CM | POA: Diagnosis not present

## 2020-06-06 DIAGNOSIS — I872 Venous insufficiency (chronic) (peripheral): Secondary | ICD-10-CM | POA: Diagnosis not present

## 2020-06-07 ENCOUNTER — Ambulatory Visit (INDEPENDENT_AMBULATORY_CARE_PROVIDER_SITE_OTHER): Payer: MEDICARE | Admitting: General Practice

## 2020-06-07 DIAGNOSIS — Z86711 Personal history of pulmonary embolism: Secondary | ICD-10-CM

## 2020-06-07 DIAGNOSIS — Z7901 Long term (current) use of anticoagulants: Secondary | ICD-10-CM

## 2020-06-07 LAB — POCT INR: INR: 1.8 — AB (ref 2.0–3.0)

## 2020-06-07 NOTE — Patient Instructions (Signed)
Pre visit review using our clinic review tool, if applicable. No additional management support is needed unless otherwise documented below in the visit note.  Take 6 mg today and then change dosage and take 3 mg daily except take 6 mg on Mondays.  Re-check in 2 weeks. Dosing instructions given to [place], [place] @ [facility].  [phone]. Re-check in 2 weeks.

## 2020-06-11 DIAGNOSIS — I872 Venous insufficiency (chronic) (peripheral): Secondary | ICD-10-CM | POA: Diagnosis not present

## 2020-06-11 DIAGNOSIS — I4891 Unspecified atrial fibrillation: Secondary | ICD-10-CM | POA: Diagnosis not present

## 2020-06-11 DIAGNOSIS — M6281 Muscle weakness (generalized): Secondary | ICD-10-CM | POA: Diagnosis not present

## 2020-06-11 DIAGNOSIS — J019 Acute sinusitis, unspecified: Secondary | ICD-10-CM | POA: Diagnosis not present

## 2020-06-11 DIAGNOSIS — Z8616 Personal history of COVID-19: Secondary | ICD-10-CM | POA: Diagnosis not present

## 2020-06-13 DIAGNOSIS — I4891 Unspecified atrial fibrillation: Secondary | ICD-10-CM | POA: Diagnosis not present

## 2020-06-13 DIAGNOSIS — J019 Acute sinusitis, unspecified: Secondary | ICD-10-CM | POA: Diagnosis not present

## 2020-06-13 DIAGNOSIS — I872 Venous insufficiency (chronic) (peripheral): Secondary | ICD-10-CM | POA: Diagnosis not present

## 2020-06-13 DIAGNOSIS — M6281 Muscle weakness (generalized): Secondary | ICD-10-CM | POA: Diagnosis not present

## 2020-06-13 DIAGNOSIS — Z8616 Personal history of COVID-19: Secondary | ICD-10-CM | POA: Diagnosis not present

## 2020-06-18 ENCOUNTER — Other Ambulatory Visit: Payer: Self-pay | Admitting: Cardiology

## 2020-06-19 DIAGNOSIS — I872 Venous insufficiency (chronic) (peripheral): Secondary | ICD-10-CM | POA: Diagnosis not present

## 2020-06-19 DIAGNOSIS — M6281 Muscle weakness (generalized): Secondary | ICD-10-CM | POA: Diagnosis not present

## 2020-06-19 DIAGNOSIS — J019 Acute sinusitis, unspecified: Secondary | ICD-10-CM | POA: Diagnosis not present

## 2020-06-19 DIAGNOSIS — Z8616 Personal history of COVID-19: Secondary | ICD-10-CM | POA: Diagnosis not present

## 2020-06-19 DIAGNOSIS — I4891 Unspecified atrial fibrillation: Secondary | ICD-10-CM | POA: Diagnosis not present

## 2020-06-20 ENCOUNTER — Ambulatory Visit (INDEPENDENT_AMBULATORY_CARE_PROVIDER_SITE_OTHER): Payer: MEDICARE | Admitting: General Practice

## 2020-06-20 DIAGNOSIS — I872 Venous insufficiency (chronic) (peripheral): Secondary | ICD-10-CM | POA: Diagnosis not present

## 2020-06-20 DIAGNOSIS — Z7901 Long term (current) use of anticoagulants: Secondary | ICD-10-CM | POA: Diagnosis not present

## 2020-06-20 DIAGNOSIS — J019 Acute sinusitis, unspecified: Secondary | ICD-10-CM | POA: Diagnosis not present

## 2020-06-20 DIAGNOSIS — Z86711 Personal history of pulmonary embolism: Secondary | ICD-10-CM | POA: Diagnosis not present

## 2020-06-20 DIAGNOSIS — M6281 Muscle weakness (generalized): Secondary | ICD-10-CM | POA: Diagnosis not present

## 2020-06-20 DIAGNOSIS — I4891 Unspecified atrial fibrillation: Secondary | ICD-10-CM | POA: Diagnosis not present

## 2020-06-20 DIAGNOSIS — Z8616 Personal history of COVID-19: Secondary | ICD-10-CM | POA: Diagnosis not present

## 2020-06-20 LAB — POCT INR: INR: 2.4 (ref 2.0–3.0)

## 2020-06-20 NOTE — Progress Notes (Signed)
I have reviewed the results and agree with this plan   

## 2020-06-20 NOTE — Patient Instructions (Addendum)
Pre visit review using our clinic review tool, if applicable. No additional management support is needed unless otherwise documented below in the visit note.  Continue to take 3 mg daily except take 6 mg on Mondays.  Re-check in 2 weeks. Dosing instructions given to Juanda Chance @ [facility].  [phone]

## 2020-06-25 ENCOUNTER — Other Ambulatory Visit: Payer: Self-pay | Admitting: *Deleted

## 2020-06-25 NOTE — Patient Outreach (Signed)
[facility]) Care Management  06/25/2020  Robert [name] Dec 10, 1950 [id number]   Outgoing call placed to member, state this is not a good time as he is driving.  Request made to call this care manager back once he is home.  Will await call back, if no call back will follow up within the next 3-4 business days.    Update:  Call received back from member, report he is still progressing.  He does not feel he is making strides as he should but verbalizes understanding that Covid recovery does take a while.  Continues to wear oxygen at night, expresses concern regarding today's loss of electricity.  Discussed having backup tank in the home for emergencies as well as notifying [employer] about home equipment.  State he has done so and waiting for electricity to be restored.  He also has a generator for emergency use.    Reportedly doing "excellent" per PT, will have last home visits from home health team this week.  Blood pressure was elevated slightly today, 146/92, state he has been out of the home today with his son. Will recheck later.  Typically 130's/80-90's.  Weight remains stable, 296-302 pounds.  Still has shortness of breath intermittently, state physician's feel it may now be related to his heart rather then the Covid infection.  Will have follow up with cardiology on 1/19.  Denies any urgent concerns, encouraged to contact this care manager with questions.  Agrees to follow up within the next month.  Goals Addressed            This Visit's Progress   . [facility] - Make and Keep All Appointments   On track    Follow Up Date 07/07/2020   - ask family or friend for a ride - call to cancel if needed - keep a calendar with appointment dates    Why is this important?   Part of staying healthy is seeing the doctor for follow-up care.  If you forget your appointments, there are some things you can do to stay on track.    Notes:   12/3 - Reviewed upcoming appointments:  PCP for follow up and [facility]    . [facility] - Optimal Respiratory Status   On track    Follow up 07/22/2020  Notes:  Reviewed breathing exercises, use of O2, and oxygen saturation monitoring        [name], Charity fundraiser, MSN [facility] Care Management  [facility] Manager [phone]

## 2020-06-26 DIAGNOSIS — Z8616 Personal history of COVID-19: Secondary | ICD-10-CM | POA: Diagnosis not present

## 2020-06-26 DIAGNOSIS — J019 Acute sinusitis, unspecified: Secondary | ICD-10-CM | POA: Diagnosis not present

## 2020-06-26 DIAGNOSIS — M6281 Muscle weakness (generalized): Secondary | ICD-10-CM | POA: Diagnosis not present

## 2020-06-26 DIAGNOSIS — I872 Venous insufficiency (chronic) (peripheral): Secondary | ICD-10-CM | POA: Diagnosis not present

## 2020-06-26 DIAGNOSIS — I4891 Unspecified atrial fibrillation: Secondary | ICD-10-CM | POA: Diagnosis not present

## 2020-06-28 DIAGNOSIS — M6281 Muscle weakness (generalized): Secondary | ICD-10-CM | POA: Diagnosis not present

## 2020-06-28 DIAGNOSIS — I4891 Unspecified atrial fibrillation: Secondary | ICD-10-CM | POA: Diagnosis not present

## 2020-06-28 DIAGNOSIS — Z8616 Personal history of COVID-19: Secondary | ICD-10-CM | POA: Diagnosis not present

## 2020-06-28 DIAGNOSIS — I872 Venous insufficiency (chronic) (peripheral): Secondary | ICD-10-CM | POA: Diagnosis not present

## 2020-06-28 DIAGNOSIS — J019 Acute sinusitis, unspecified: Secondary | ICD-10-CM | POA: Diagnosis not present

## 2020-07-01 DIAGNOSIS — M542 Cervicalgia: Secondary | ICD-10-CM | POA: Diagnosis not present

## 2020-07-05 ENCOUNTER — Encounter: Payer: Self-pay | Admitting: Cardiology

## 2020-07-11 ENCOUNTER — Encounter: Payer: Self-pay | Admitting: Cardiology

## 2020-07-11 ENCOUNTER — Other Ambulatory Visit: Payer: Self-pay

## 2020-07-11 ENCOUNTER — Ambulatory Visit (INDEPENDENT_AMBULATORY_CARE_PROVIDER_SITE_OTHER): Payer: MEDICARE | Admitting: Cardiology

## 2020-07-11 VITALS — BP 130/80 | Ht 71.0 in | Wt 315.0 lb

## 2020-07-11 DIAGNOSIS — I872 Venous insufficiency (chronic) (peripheral): Secondary | ICD-10-CM

## 2020-07-11 DIAGNOSIS — Z8616 Personal history of COVID-19: Secondary | ICD-10-CM

## 2020-07-11 DIAGNOSIS — Z6841 Body Mass Index (BMI) 40.0 and over, adult: Secondary | ICD-10-CM | POA: Diagnosis not present

## 2020-07-11 DIAGNOSIS — Z7901 Long term (current) use of anticoagulants: Secondary | ICD-10-CM

## 2020-07-11 DIAGNOSIS — I4819 Other persistent atrial fibrillation: Secondary | ICD-10-CM | POA: Diagnosis not present

## 2020-07-11 DIAGNOSIS — I10 Essential (primary) hypertension: Secondary | ICD-10-CM | POA: Diagnosis not present

## 2020-07-11 NOTE — Patient Instructions (Signed)

## 2020-07-11 NOTE — Progress Notes (Signed)
Cardiology Office Note:    Date:  07/11/2020   ID:  Robert [name], DOB [birth year], MRN [id number]  PCP:  Cassandria Anger, MD  Cardiologist:  Buford Dresser, MD  Referring MD: Cassandria Anger, MD   Chief Complaint  Patient presents with  . Follow-up    Follow-up some chest pain, Short Of Breath    History of Present Illness:    Robert [name] is a 70 y.o. male with a hx of severe Covid infection requiring prolonged hospitalization, atrial fibrillation (persistent, started with Covid), prior DVT/PE, dyslipidemia, obesity, paroxysmal SVT who is seen in follow up at the request of [name], [name], MD for the evaluation and management of arrhythmia. He was previously seen by cardiology in 04/2014 by Dr. Johnsie Cancel. His initial consult with me was on 04/23/18. Please see that note for full review of his history.  Today: He is slowly recovering from Covid. Has intermittent episodes of hard beats/feeling like stopping and starting. No longer has home healthcare, can walk over 200 ft on his own. Generally feels poorly, very fatigued, having nerve pain in both arms. Very sensitive to the cold, doesn't tolerate it with his breathing.  Discussed slow process of healing from Covid, gradual recovery. Still using O2 at night. Feels that Covid has affected every location he has had an injury/surgery before.  In atrial fib today. Rate controlled at rest, still rises with exertion. Discussed that we have limited options for management at this time, as he cannot receive beta blockers or calcium channel blockers. Discussed the likelihood of maintaining sinus rhythm with cardioversion. We agree to continue with amiodarone, though long term risk of lund injury is not trivial.  ROS positive for fatigue, joint pain, shortness of breath. Negative for active/significant bleeding.  Past Medical History:  Diagnosis Date  . Anxiety   . Asthma    "no attack since acupuncture in 1990's"  .  Complication of anesthesia    SLOW TO WAKE UP / DIFFICULTY RESPONDING 2003, 3 days before could use left arm, "wild/crazy sometimes"  . Cyst of left kidney    "benign"  . DVT (deep venous thrombosis) ([place]) 2003   right femoral artery "from groin to knee"  . Edema    Right Leg w/ h/o DVT postphlebitic  . H/O blood transfusion reaction 2004   FFP, extreme swelling, could not breath  . Headache(784.0)    MIGRAINES-not for a long time  . History of pulmonary embolism 2003   both lungs  . Hyperlipidemia   . Knee pain    left  . LBP (low back pain)   . 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 Obesity   . Osteoarthritis   . Peripheral vascular disease ([place])    "poor circulation in  RT leg"  . PONV (postoperative nausea and vomiting)    during surgery in 1990's  . Spondylolisthesis   . Warfarin anticoagulation   . Wheezing    when laying on left side    Past Surgical History:  Procedure Laterality Date  . APPENDECTOMY  1993   Dr Margot Chimes  . CHOLECYSTECTOMY  1993  . FINGER SURGERY  2012   RT THUMB RECONSTRUCTION  . HAND SURGERY Right    abcess  . HEMORROIDECTOMY  1981  . I & D KNEE WITH POLY EXCHANGE Left 04/04/2013   Procedure: IRRIGATION AND DEBRIDEMENT KNEE WITH POLY EXCHANGE AND INSERTION OF CEMENT BEADS;  Surgeon: Gearlean Alf, MD;  Location: [facility];  Service: Orthopedics;  Laterality: Left;  . KNEE ARTHROSCOPY  1990   RT KNEE  . KNEE ARTHROSCOPY Right 2003  . KNEE ARTHROSCOPY  1988   L KNEE  . KNEE ARTHROSCOPY WITH MENISCAL REPAIR Left 10/22/2012   Procedure: LEFT KNEE ARTHROSCOPY PARTIAL MEDIAL AND LATERAL MENISCECTOMY AND DEBRIDEMENT, CHONDROPLASTY ;  Surgeon: Johnn Hai, MD;  Location: [facility];  Service: Orthopedics;  Laterality: Left;  . LUMBAR DISC SURGERY    . LUMBAR FUSION  2003   Dr Maxie Better  . ORCHIECTOMY  1994   R post injury  . TONSILLECTOMY  1967  . TOTAL KNEE ARTHROPLASTY Left 03/21/2013   Procedure: LEFT TOTAL KNEE ARTHROPLASTY;   Surgeon: Gearlean Alf, MD;  Location: [facility];  Service: Orthopedics;  Laterality: Left;    Current Medications: Current Outpatient Medications on File Prior to Visit  Medication Sig  . albuterol (PROVENTIL) (2.5 MG/3ML) 0.083% nebulizer solution Take 3 mLs (2.5 mg total) by nebulization every 4 (four) hours as needed for wheezing or shortness of breath. Dx  J45.909  . albuterol (VENTOLIN HFA) 108 (90 Base) MCG/ACT inhaler Inhale 2 puffs into the lungs every 6 (six) hours as needed for wheezing or shortness of breath.  [name] amiodarone (PACERONE) 200 MG tablet Take 1 tablet (200 mg total) by mouth daily.  . Cholecalciferol (VITAMIN D3) 125 MCG (5000 UT) CAPS Take 1 capsule (5,000 Units total) by mouth daily.  [name] doxycycline (VIBRA-TABS) 100 MG tablet Take 1 tablet (100 mg total) by mouth 2 (two) times daily.  . fish oil-omega-3 fatty acids 1000 MG capsule Take 2 g by mouth every morning.  . furosemide (LASIX) 20 MG tablet Take 1-2 tablets (20-40 mg total) by mouth daily.  [name] glucosamine-chondroitin 500-400 MG tablet Take 1 tablet by mouth every morning.  . nortriptyline (PAMELOR) 10 MG capsule Take 1 capsule (10 mg total) by mouth every evening.  . predniSONE (DELTASONE) 5 MG tablet Take 1 tablet (5 mg total) by mouth daily with breakfast. Continue your previous home dose 5 mg of prednisone daily unchanged as before.  . warfarin (COUMADIN) 3 MG tablet Take 3 mg by mouth daily.   No current facility-administered medications on file prior to visit.     Allergies:   Cat hair extract, Diltiazem, Sulfa antibiotics, Anoro ellipta [umeclidinium-vilanterol], Breo ellipta [fluticasone furoate-vilanterol], Exalgo [hydromorphone hcl], Fentanyl, Gabapentin, Hydrocodone-acetaminophen, Lopressor [metoprolol tartrate], Penicillins, Sulfonamide derivatives, and Tylenol [acetaminophen]   Social History   Tobacco Use  . Smoking status: Never Smoker  . Smokeless tobacco: Never Used  Vaping Use  . Vaping Use:  Never used  Substance Use Topics  . Alcohol use: No  . Drug use: No    Family History: The patient's family history includes Heart disease in his mother and sister; Hyperlipidemia in an other family member; Hypertension in an other family member; Parkinsonism in an other family member. son passed away with hard heartbeats. Mother had 5V CABG about 10 years ago, had to have a pacemaker placed.  ROS:   Please see the history of present illness.  Additional pertinent ROS included in HPI.  EKGs/Labs/Other Studies Reviewed:    The following studies were reviewed today: Echo 03/24/20 1. Left ventricular ejection fraction, by estimation, is 55 to 60%. The  left ventricle has normal function. The left ventricle has no regional  wall motion abnormalities. Left ventricular diastolic parameters are  indeterminate.  2. Right ventricular systolic function is normal. The right ventricular  size  is normal.  3. Left atrial size was mild to moderately dilated.  4. The mitral valve is normal in structure. No evidence of mitral valve  regurgitation. No evidence of mitral stenosis.  5. The aortic valve has an indeterminant number of cusps. There is  moderate calcification of the aortic valve. There is moderate thickening  of the aortic valve. Aortic valve regurgitation is not visualized. Mild to  moderate aortic valve stenosis.Aortic  valve mean gradient measures 19.3 mmHg. Aortic valve peak gradient  measures 32.1 mmHg. Aortic valve area, by VTI measures 1.26 cm.   Echo 05/06/18 - Left ventricle: The cavity size was normal. Systolic function was   normal. The estimated ejection fraction was in the range of 60%   to 65%. Wall motion was normal; there were no regional wall   motion abnormalities. - Aortic valve: There was very mild stenosis. Valve area (VTI):   1.67 cm^2. Valve area (Vmax): 1.85 cm^2. Valve area (Vmean): 1.59   cm^2. - Mitral valve: There was mild regurgitation. - Left  atrium: The atrium was mildly to moderately dilated.  Impressions: - Left ventricular systolic function is preserved visually   estimated ejection fraction 60 to 65%.   Mild mitral regurgitation.   Mild to moderate left atrial enlargement   Very mild aortic stenosis.  Monitor 05/27/18 Patient had a min HR of 53 bpm, max HR of 214 bpm, and avg HR of 89 bpm. Predominant underlying rhythm was Sinus Rhythm. There were 9 patient triggered events, and Supraventricular Tachycardia was detected within +/- 45 seconds of symptomatic patient event(s).   Monitor noted 6 episodes of wide complex tachycardia; these are labeled as VT but are different morphology from the majority of his PVCs and bigeminy/trigeminy. Could be VT vs. SVT with aberrancy, given that these events occurred at a faster heart rate than his predominant SVT. The fastest WCT lasted 15 beats with a max rate of 197 bpm (avg 164 bpm); the run with the fastest interval was also the longest.   4752 narrow complex Supraventricular Tachycardia runs occurred, the run with the fastest interval lasting 1 min 37 secs with a max rate of 214 bpm, the longest lasting 56 mins 49 secs with an avg rate of 126 bpm. Some episodes of Supraventricular Tachycardia conducted with possible aberrancy. Isolated SVEs were frequent (10.9%, [id number]), SVE Couplets were rare (<1.0%, 7844), and SVE Triplets were rare (<1.0%, 793). Isolated VEs were occasional (2.1%, [id number]), VE Couplets were rare (<1.0%, 338), and VE Triplets were rare (<1.0%, 5). Ventricular Bigeminy and Trigeminy were present.  Echo stress 03/27/11 - Stress ECG conclusions: The stress ECG was normal. - Staged echo: There was no echocardiographic evidence for stress-induced ischemia. Impressions:  - Stress echo with chest tightness but no ST changes; no stress-induced wall motion abnormalities  EKG:  ECG personally reviewed. The ekg ordered today demonstrates atrial fibrillation at 89  bpm  Recent Labs: 03/26/2020: TSH 1.169 04/04/2020: B Natriuretic Peptide 107.7; Magnesium 2.1 05/31/2020: ALT 17; BUN 15; Creatinine, Ser 1.05; Hemoglobin 14.0; Platelets 330.0; Potassium 3.9; Sodium 140  Recent Lipid Panel    Component Value Date/Time   CHOL 208 (H) 03/30/2019 0742   TRIG 112 03/23/2020 0703   TRIG 118 04/30/2006 0841   HDL 34.40 (L) 03/30/2019 0742   CHOLHDL 6 03/30/2019 0742   VLDL 33.0 03/30/2019 0742   LDLCALC 141 (H) 03/30/2019 0742   LDLDIRECT 137.0 03/10/2017 0842    Physical Exam:    VS:  BP 130/80 (  BP Location: Left Arm, Patient Position: Sitting)   Ht 5\' 11"  (1.803 m)   Wt (!) 315 lb (142.9 kg)   SpO2 96%   BMI 43.93 kg/m     Wt Readings from Last 3 Encounters:  07/11/20 (!) 315 lb (142.9 kg)  05/31/20 (!) 304 lb 6.4 oz (138.1 kg)  05/31/20 299 lb (135.6 kg)    GEN: Well nourished, well developed in no acute distress HEENT: Normal, moist mucous membranes NECK: No JVD CARDIAC: irregularly irregular rhythm, normal S1 and S2, no rubs or gallops. 1/6 SEM VASCULAR: Radial and DP pulses 2+ bilaterally. No carotid bruits RESPIRATORY:  Clear to auscultation without rales, wheezing or rhonchi  ABDOMEN: Soft, non-tender, non-distended MUSCULOSKELETAL:  Ambulates independently SKIN: Warm and dry, bilateral trace LE edema, compression stockings in place NEUROLOGIC:  Alert and oriented x 3. No focal neuro deficits noted. PSYCHIATRIC:  Normal affect   ASSESSMENT:    1. Persistent atrial fibrillation ([place])   2. Essential hypertension   3. Personal history of COVID-19   4. Long term current use of anticoagulant   5. Chronic venous insufficiency   6. Class 3 severe obesity due to excess calories with serious comorbidity and body mass index (BMI) of 40.0 to 44.9 in adult [facility])    PLAN:    Persistent atrial fibrillation -began during his Covid illness -given limitations/sensitivities to medications, limited options. Tolerated amiodarone as a rate  control agent. This is not ideal long term give risk of lung toxicity and his lungs damaged from Covid, but cannot tolerate diltiazem or beta blockers. -CHA2DS2/VAS Stroke Risk Points=4  -on long term coumadin -discussed potential for cardioversion, but he feels like it is unlikely to hold given his continued symptoms/illness. He would like six more months to see if he can get closer to his prior baseline, then would consider discussion of cardioversion.  Hypertension:  -at goal today -medications changed after Covid hospitalization, currently on furosemide alone  History of DVT/PE with chronic venous insufficiency, on long term anticoagulation with coumadin:  -No changes, not interested in DOAC, continue coumadin therapy.  -Uses compression stocking for insufficiency  Dyslipidemia: last LDL 151, TG 181. We have discussed at length options for management. He prefers to stay on OTC fish oil. Has significantly elevated ASCVD risk, below. Discussed that OTC fish oil not recommended from a cardiovascular perspective.  Obesity: BMI ~44. Aware that weight loss would be beneficial, but he feels very limited in his ability to do this due to his chronic medical conditions.  CV risk counseling and prevention -recommend heart healthy/Mediterranean diet, with whole grains, fruits, vegetable, fish, lean meats, nuts, and olive oil. Limit salt. -recommend moderate walking, 3-5 times/week for 30-50 minutes each session. Aim for at least 150 minutes.week. Goal should be pace of 3 miles/hours, or walking 1.5 miles in 30 minutes -recommend avoidance of tobacco products. Avoid excess alcohol. -ASCVD risk score: The 10-year ASCVD risk score Mikey Bussing DC Brooke Bonito., et al., 2013) is: 27.8%   Values used to calculate the score:     Age: 84 years     Sex: Male     Is Non-Hispanic African American: No     Diabetic: No     Tobacco smoker: No     Systolic Blood Pressure: 123456 mmHg     Is BP treated: Yes     HDL Cholesterol:  34.4 mg/dL     Total Cholesterol: 208 mg/dL   Plan for follow up: 6 mos or sooner PRN  Medication Adjustments/Labs and Tests Ordered: Current medicines are reviewed at length with the patient today.  Concerns regarding medicines are outlined above.  Orders Placed This Encounter  Procedures  . EKG 12-Lead   No orders of the defined types were placed in this encounter.   Patient Instructions  Medication Instructions:  Your Physician recommend you continue on your current medication as directed.    *If you need a refill on your cardiac medications before your next appointment, please call your pharmacy*   Lab Work: None   Testing/Procedures: None   Follow-Up: At [facility], you and your health needs are our priority.  As part of our continuing mission to provide you with exceptional heart care, we have created designated Provider Care Teams.  These Care Teams include your primary Cardiologist (physician) and Advanced Practice Providers (APPs -  Physician Assistants and Nurse Practitioners) who all work together to provide you with the care you need, when you need it.  We recommend signing up for the patient portal called "MyChart".  Sign up information is provided on this After Visit Summary.  MyChart is used to connect with patients for Virtual Visits (Telemedicine).  Patients are able to view lab/test results, encounter notes, upcoming appointments, etc.  Non-urgent messages can be sent to your provider as well.   To learn more about what you can do with MyChart, go to [email or web address].    Your next appointment:   6 month(s)  The format for your next appointment:   In Person  Provider:   Buford Dresser, MD        Signed, Buford Dresser, MD PhD 07/11/2020    [place]

## 2020-07-13 ENCOUNTER — Other Ambulatory Visit: Payer: Self-pay | Admitting: Internal Medicine

## 2020-07-13 MED ORDER — PREDNISONE 5 MG PO TABS: 5.0000 mg | ORAL_TABLET | Freq: Every day | ORAL | 1 refills | Status: DC

## 2020-07-17 ENCOUNTER — Other Ambulatory Visit: Payer: Self-pay | Admitting: General Practice

## 2020-07-17 ENCOUNTER — Other Ambulatory Visit: Payer: Self-pay

## 2020-07-17 ENCOUNTER — Ambulatory Visit (INDEPENDENT_AMBULATORY_CARE_PROVIDER_SITE_OTHER): Payer: MEDICARE | Admitting: General Practice

## 2020-07-17 DIAGNOSIS — Z86711 Personal history of pulmonary embolism: Secondary | ICD-10-CM

## 2020-07-17 DIAGNOSIS — Z7901 Long term (current) use of anticoagulants: Secondary | ICD-10-CM

## 2020-07-17 LAB — POCT INR: INR: 2.4 (ref 2.0–3.0)

## 2020-07-17 MED ORDER — WARFARIN SODIUM 3 MG PO TABS: ORAL_TABLET | ORAL | 1 refills | Status: DC

## 2020-07-17 NOTE — Patient Instructions (Signed)
Pre visit review using our clinic review tool, if applicable. No additional management support is needed unless otherwise documented below in the visit note.  Continue to take 3 mg daily except take 6 mg on Mondays.  Re-check in 6 weeks.

## 2020-07-24 ENCOUNTER — Other Ambulatory Visit: Payer: Self-pay | Admitting: *Deleted

## 2020-07-24 NOTE — Patient Outreach (Signed)
[facility] [facility]) Care Management  [place]  07/24/2020   Robert [name] 17-Nov-1950 [id number]   Outgoing call placed to member, state he has progressed well.  Report he has not used his home O2 in about 2 weeks, oxygen levels have remained 95-98%.  He does have some shortness of breath with activity, relieved with rest and deep breathing techniques.  Verbalizes understanding that he has severe lung damage, which has gotten worse since having Covid.  Feel he is continuing to manage as well as anticipated.    Seen by cardiology on 1/19, state visit went well.  His Lasix was changed to 40 mg instead of 20mg , state extremity swelling has now resolved.  Blood pressure has remained less than 967 systolic, which is his goal.  Noted per chart that cardiology mentioned cardioversion for persistent A-fib, however he does not feel it will work and does not want procedure.  He will follow up with cardiology in the next 6 months to discuss further.  Will see PCP for follow up on 3/9.  Denies any urgent concerns, encouraged to contact this care manager with questions.     Encounter Medications:  Outpatient Encounter Medications as of 07/24/2020  Medication Sig  . albuterol (PROVENTIL) (2.5 MG/3ML) 0.083% nebulizer solution Take 3 mLs (2.5 mg total) by nebulization every 4 (four) hours as needed for wheezing or shortness of breath. Dx  J45.909  . albuterol (VENTOLIN HFA) 108 (90 Base) MCG/ACT inhaler Inhale 2 puffs into the lungs every 6 (six) hours as needed for wheezing or shortness of breath.  [name] amiodarone (PACERONE) 200 MG tablet Take 1 tablet (200 mg total) by mouth daily.  . Cholecalciferol (VITAMIN D3) 125 MCG (5000 UT) CAPS Take 1 capsule (5,000 Units total) by mouth daily.  . fish oil-omega-3 fatty acids 1000 MG capsule Take 2 g by mouth every morning.  . furosemide (LASIX) 20 MG tablet Take 1-2 tablets (20-40 mg total) by mouth daily.  [name] glucosamine-chondroitin 500-400 MG  tablet Take 1 tablet by mouth every morning.  . nortriptyline (PAMELOR) 10 MG capsule Take 1 capsule (10 mg total) by mouth every evening.  . predniSONE (DELTASONE) 5 MG tablet Take 1 tablet (5 mg total) by mouth daily with breakfast. Continue your previous home dose 5 mg of prednisone daily unchanged as before.  . warfarin (COUMADIN) 3 MG tablet Take 1 tablet daily or take as directed by anticoagulation clinic   No facility-administered encounter medications on file as of 07/24/2020.    Functional Status:  In your present state of health, do you have any difficulty performing the following activities: 05/28/2020 03/28/2020  Hearing? N N  Vision? N N  Difficulty concentrating or making decisions? N N  Walking or climbing stairs? N Y  Dressing or bathing? N N  Doing errands, shopping? N N  Preparing Food and eating ? N -  Using the Toilet? N -  In the past six months, have you accidently leaked urine? N -  Do you have problems with loss of bowel control? N -  Managing your Medications? N -  Managing your Finances? N -  Housekeeping or managing your Housekeeping? N -  Some recent data might be hidden    Fall/Depression Screening: Fall Risk  05/31/2020 05/28/2020 08/29/2019  Falls in the past year? 1 0 0  Comment - - -  Number falls in past yr: 0 0 0  Injury with Fall? 0 0 0  Risk for fall due  to : - No Fall Risks -  Follow up - Falls evaluation completed -   PHQ 2/9 Scores 05/31/2020 05/28/2020 04/10/2020 02/05/2018  PHQ - 2 Score 0 0 1 0  Exception Documentation - - (No Data) -    Assessment:  Goals Addressed            This Visit's Progress   . [facility] - Make and Keep All Appointments   On track    Follow Up Date 08/22/2020  Timeframe:  Short-Term Goal Priority:  Medium Start Date:         07/24/2020                    Expected End Date:     07/25/2020                  - ask family or friend for a ride - call to cancel if needed - keep a calendar with appointment dates    Why  is this important?   Part of staying healthy is seeing the doctor for follow-up care.  If you forget your appointments, there are some things you can do to stay on track.    Notes:   12/3 - Reviewed upcoming appointments: PCP for follow up and [place]  2/1 - Recent cardiology visit reviewed, upcoming appointments reviewed.  Next is March with PCP    . [facility] - Optimal Respiratory Status   On track    Follow up 08/22/2020  Timeframe:  Long-Range Goal Priority:  High Start Date:       07/24/2020                      Expected End Date:      09/21/2020                  Notes:  Reviewed breathing exercises, use of O2, and oxygen saturation monitoring  2/1 - Reviewed decreasing use of oxygen, encouraged to use as instructed       Plan:  Follow-up:  Patient agrees to Care Plan and Follow-up. Will follow up within the next month.  [name], [place], MSN [place] [phone]

## 2020-07-26 ENCOUNTER — Other Ambulatory Visit: Payer: Self-pay | Admitting: Internal Medicine

## 2020-07-26 MED ORDER — FUROSEMIDE 20 MG PO TABS: 20.0000 mg | ORAL_TABLET | Freq: Every day | ORAL | 3 refills | Status: DC

## 2020-07-27 NOTE — Addendum Note (Signed)
Addended by: Earnstine Regal on: 07/27/2020 03:14 PM   Modules accepted: Orders

## 2020-07-27 NOTE — Telephone Encounter (Signed)
[facility] is calling needing order fax for the mobile chest xray that was done back in October. MD gave verbal 04/11/20. Verified chart generated cxr order.Marye Round fax to [facility]...[name]

## 2020-07-30 ENCOUNTER — Other Ambulatory Visit: Payer: Self-pay | Admitting: Internal Medicine

## 2020-08-06 ENCOUNTER — Other Ambulatory Visit: Payer: Self-pay | Admitting: *Deleted

## 2020-08-06 NOTE — Patient Outreach (Signed)
[facility]) Care Management  08/06/2020  Robert [name] 1950/12/26 [id number]   Patient is being transitioned to Chronic Care Management with the primary provider office, case closed.  [name], [place], MSN [place] [phone]

## 2020-08-07 ENCOUNTER — Other Ambulatory Visit: Payer: Self-pay | Admitting: Internal Medicine

## 2020-08-07 ENCOUNTER — Ambulatory Visit: Payer: Self-pay | Admitting: *Deleted

## 2020-08-07 DIAGNOSIS — Z7901 Long term (current) use of anticoagulants: Secondary | ICD-10-CM

## 2020-08-07 MED ORDER — WARFARIN SODIUM 3 MG PO TABS: ORAL_TABLET | ORAL | 1 refills | Status: DC

## 2020-08-07 NOTE — Chronic Care Management (AMB) (Signed)
   08/07/2020  [name] [birth year] [id number]  Unsuccessful outreach to Robert [name] who was previously enrolled in Community Case Management program to offer ongoing case management support/ follow up.  Plan:  Will re-attempt telephone Outreach next week  Oneta Rack, RN, BSN, [facility] RN Care Coordination- [place] [phone]

## 2020-08-07 NOTE — Patient Instructions (Signed)
Hello Mr. Spiker,  I tried to contact you by phone today but was unsuccessful; I am a partner of Brayton Layman, your previous case manager but I work with directly with Dr. Alain Marion at the [facility] office.  I called to check in and make sure that you are doing well and to let you know that I am here to help if you have any needs related to your health care.  I will try to contact you by phone again next week.   Feel free to reach out to me at [phone] if the care management/care coordination team may be of assistance to you in the meantime.   Oneta Rack, RN, BSN, [facility] RN Care Coordination- [place] [phone]

## 2020-08-12 ENCOUNTER — Encounter: Payer: Self-pay | Admitting: Cardiology

## 2020-08-13 ENCOUNTER — Ambulatory Visit: Payer: Self-pay | Admitting: *Deleted

## 2020-08-13 ENCOUNTER — Encounter: Payer: Self-pay | Admitting: *Deleted

## 2020-08-13 NOTE — Chronic Care Management (AMB) (Signed)
  Care Management   Outreach Note  08/13/2020 Name: Robert [name] MRN: [id number] DOB: September 17, 1950  Referred by: Cassandria Anger, MD Reason for referral : Care Coordination (RN CM Telephone Outreach)  A second telephone outreach was attempted and completed today. Robert [name] was previously enrolled in Community Case Management program and call today was to offer ongoing case management support/ follow up.  Robert [name] declined ongoing participation in care management, stating no unmet care coordination/ care management needs.   Follow Up Plan: The patient has been provided with contact information for the care management team and has been advised to call with any health related questions or concerns.   Oneta Rack, RN, BSN, [facility] RN Care Coordination- [place] [phone]

## 2020-08-13 NOTE — Patient Instructions (Signed)
Visit Information  Thank you for allowing me to share the care management and care coordination services that are available to you as part of your health plan and services through your primary care provider and medical home. Please reach out to me at [phone] if the care management/care coordination team may be of assistance to you in the future.   Oneta Rack, RN, BSN, [facility] RN Care Coordination- [place] [phone]

## 2020-08-22 ENCOUNTER — Ambulatory Visit: Payer: Self-pay | Admitting: *Deleted

## 2020-08-28 ENCOUNTER — Other Ambulatory Visit: Payer: Self-pay

## 2020-08-28 ENCOUNTER — Ambulatory Visit (INDEPENDENT_AMBULATORY_CARE_PROVIDER_SITE_OTHER): Payer: MEDICARE | Admitting: General Practice

## 2020-08-28 DIAGNOSIS — Z7901 Long term (current) use of anticoagulants: Secondary | ICD-10-CM | POA: Diagnosis not present

## 2020-08-28 DIAGNOSIS — Z86711 Personal history of pulmonary embolism: Secondary | ICD-10-CM

## 2020-08-28 LAB — POCT INR: INR: 1.7 — AB (ref 2.0–3.0)

## 2020-08-28 NOTE — Patient Instructions (Signed)
Pre visit review using our clinic review tool, if applicable. No additional management support is needed unless otherwise documented below in the visit note.  Take 6 mg today and then change overall dosage and take 3 mg daily except take 6 mg on Mondays and Thursdays.  Re-check in 2 weeks due to stopping the amiodarone.

## 2020-08-29 ENCOUNTER — Ambulatory Visit (INDEPENDENT_AMBULATORY_CARE_PROVIDER_SITE_OTHER): Payer: MEDICARE | Admitting: Internal Medicine

## 2020-08-29 ENCOUNTER — Encounter: Payer: Self-pay | Admitting: Internal Medicine

## 2020-08-29 DIAGNOSIS — I471 Supraventricular tachycardia: Secondary | ICD-10-CM | POA: Diagnosis not present

## 2020-08-29 DIAGNOSIS — U099 Post covid-19 condition, unspecified: Secondary | ICD-10-CM | POA: Diagnosis not present

## 2020-08-29 DIAGNOSIS — Z86718 Personal history of other venous thrombosis and embolism: Secondary | ICD-10-CM | POA: Diagnosis not present

## 2020-08-29 DIAGNOSIS — E785 Hyperlipidemia, unspecified: Secondary | ICD-10-CM | POA: Diagnosis not present

## 2020-08-29 DIAGNOSIS — Z86711 Personal history of pulmonary embolism: Secondary | ICD-10-CM | POA: Diagnosis not present

## 2020-08-29 DIAGNOSIS — M79672 Pain in left foot: Secondary | ICD-10-CM | POA: Insufficient documentation

## 2020-08-29 DIAGNOSIS — E538 Deficiency of other specified B group vitamins: Secondary | ICD-10-CM

## 2020-08-29 DIAGNOSIS — S92902D Unspecified fracture of left foot, subsequent encounter for fracture with routine healing: Secondary | ICD-10-CM

## 2020-08-29 DIAGNOSIS — S92902A Unspecified fracture of left foot, initial encounter for closed fracture: Secondary | ICD-10-CM | POA: Insufficient documentation

## 2020-08-29 LAB — COMPREHENSIVE METABOLIC PANEL
ALT: 19 U/L (ref 0–53)
AST: 18 U/L (ref 0–37)
Albumin: 4.2 g/dL (ref 3.5–5.2)
Alkaline Phosphatase: 68 U/L (ref 39–117)
BUN: 20 mg/dL (ref 6–23)
CO2: 30 mEq/L (ref 19–32)
Calcium: 9.8 mg/dL (ref 8.4–10.5)
Chloride: 97 mEq/L (ref 96–112)
Creatinine, Ser: 1.36 mg/dL (ref 0.40–1.50)
GFR: 53.31 mL/min — ABNORMAL LOW (ref >60.00)
Glucose, Bld: 106 mg/dL — ABNORMAL HIGH (ref 70–99)
Potassium: 4.7 mEq/L (ref 3.5–5.1)
Sodium: 137 mEq/L (ref 135–145)
Total Bilirubin: 0.6 mg/dL (ref 0.2–1.2)
Total Protein: 8.4 g/dL — ABNORMAL HIGH (ref 6.0–8.3)

## 2020-08-29 LAB — TSH: TSH: 14.88 u[IU]/mL — ABNORMAL HIGH (ref 0.35–4.50)

## 2020-08-29 NOTE — Assessment & Plan Note (Signed)
Pt declined statins 

## 2020-08-29 NOTE — Assessment & Plan Note (Signed)
Off Amiodarone - side effects

## 2020-08-29 NOTE — Assessment & Plan Note (Signed)
Taste changes, fatigue, brain fog

## 2020-08-29 NOTE — Assessment & Plan Note (Signed)
On Coumadin 

## 2020-08-29 NOTE — Assessment & Plan Note (Signed)
Residual swelling Compr sock

## 2020-08-29 NOTE — Addendum Note (Signed)
Addended by: Jacobo Forest on: 08/29/2020 11:48 AM   Modules accepted: Orders

## 2020-08-29 NOTE — Assessment & Plan Note (Signed)
Robert [name] is eating better - lost wt

## 2020-08-29 NOTE — Progress Notes (Signed)
Subjective:  Patient ID: Robert [name], male    DOB: 1950-09-16  Age: 70 y.o. MRN: [id number]  CC: Follow-up (3 MONTH F/U)   HPI Robert [name] presents for HTN, SVT, DVT  Outpatient Medications Prior to Visit  Medication Sig Dispense Refill  . albuterol (PROVENTIL) (2.5 MG/3ML) 0.083% nebulizer solution Take 3 mLs (2.5 mg total) by nebulization every 4 (four) hours as needed for wheezing or shortness of breath. Dx  J45.909 150 mL 3  . albuterol (VENTOLIN HFA) 108 (90 Base) MCG/ACT inhaler Inhale 2 puffs into the lungs every 6 (six) hours as needed for wheezing or shortness of breath. 6.7 g 0  . Cholecalciferol (VITAMIN D3) 125 MCG (5000 UT) CAPS Take 1 capsule (5,000 Units total) by mouth daily. 100 capsule 3  . fish oil-omega-3 fatty acids 1000 MG capsule Take 2 g by mouth every morning.    . furosemide (LASIX) 20 MG tablet TAKE 1 TABLET DAILY 90 tablet 3  . glucosamine-chondroitin 500-400 MG tablet Take 1 tablet by mouth every morning.    . nortriptyline (PAMELOR) 10 MG capsule TAKE 1 CAPSULE EVERY EVENING 90 capsule 3  . predniSONE (DELTASONE) 5 MG tablet Take 1 tablet (5 mg total) by mouth daily with breakfast. Continue your previous home dose 5 mg of prednisone daily unchanged as before. 90 tablet 1  . warfarin (COUMADIN) 3 MG tablet Take 1 tablet daily except take 2 tablets on Monday or Take as directed by anticoagulation clinic 105 tablet 1  . amiodarone (PACERONE) 200 MG tablet Take 1 tablet (200 mg total) by mouth daily. (Patient not taking: Reported on 08/29/2020) 90 tablet 3   No facility-administered medications prior to visit.    ROS: Review of Systems  Constitutional: Positive for fatigue. Negative for appetite change and unexpected weight change.  HENT: Negative for congestion, nosebleeds, sneezing, sore throat and trouble swallowing.   Eyes: Negative for itching and visual disturbance.  Respiratory: Negative for cough.   Cardiovascular: Positive for leg  swelling. Negative for chest pain and palpitations.  Gastrointestinal: Negative for abdominal distention, blood in stool, diarrhea and nausea.  Genitourinary: Negative for frequency and hematuria.  Musculoskeletal: Positive for arthralgias. Negative for back pain, gait problem, joint swelling and neck pain.  Skin: Negative for rash.  Neurological: Positive for weakness. Negative for dizziness, tremors and speech difficulty.  Psychiatric/Behavioral: Negative for agitation, dysphoric mood, sleep disturbance and suicidal ideas. The patient is not nervous/anxious.     Objective:  BP 138/82 (BP Location: Left Arm)   Pulse 98   Temp 97.9 F (36.6 C) (Oral)   Ht 5\' 11"  (1.803 m)   Wt (!) 312 lb (141.5 kg)   SpO2 98%   BMI 43.52 kg/m   BP Readings from Last 3 Encounters:  08/29/20 138/82  07/11/20 130/80  05/31/20 (!) 148/82    Wt Readings from Last 3 Encounters:  08/29/20 (!) 312 lb (141.5 kg)  07/11/20 (!) 315 lb (142.9 kg)  05/31/20 (!) 304 lb 6.4 oz (138.1 kg)    Physical Exam Constitutional:      General: He is not in acute distress.    Appearance: He is well-developed. He is obese.     Comments: NAD  HENT:     Mouth/Throat:     Mouth: Oropharynx is clear and moist.  Eyes:     Conjunctiva/sclera: Conjunctivae normal.     Pupils: Pupils are equal, round, and reactive to light.  Neck:     Thyroid: No  thyromegaly.     Vascular: No JVD.  Cardiovascular:     Rate and Rhythm: Normal rate and regular rhythm.     Pulses: Intact distal pulses.     Heart sounds: Normal heart sounds. No murmur heard. No friction rub. No gallop.   Pulmonary:     Effort: Pulmonary effort is normal. No respiratory distress.     Breath sounds: Normal breath sounds. No wheezing or rales.  Chest:     Chest wall: No tenderness.  Abdominal:     General: Bowel sounds are normal. There is no distension.     Palpations: Abdomen is soft. There is no mass.     Tenderness: There is no abdominal  tenderness. There is no guarding or rebound.  Musculoskeletal:        General: No tenderness. Normal range of motion.     Cervical back: Normal range of motion.     Right lower leg: Edema present.     Left lower leg: Edema present.  Lymphadenopathy:     Cervical: No cervical adenopathy.  Skin:    General: Skin is warm and dry.     Findings: No rash.  Neurological:     Mental Status: He is alert and oriented to person, place, and time.     Cranial Nerves: No cranial nerve deficit.     Motor: No abnormal muscle tone.     Coordination: He displays a negative Romberg sign. Coordination normal.     Gait: Gait normal.     Deep Tendon Reflexes: Reflexes are normal and symmetric.  Psychiatric:        Mood and Affect: Mood and affect normal.        Behavior: Behavior normal.        Thought Content: Thought content normal.        Judgment: Judgment normal.   L foot w/pain - recent fx  Lab Results  Component Value Date   WBC 11.6 (H) 05/31/2020   HGB 14.0 05/31/2020   HCT 42.2 05/31/2020   PLT 330.0 05/31/2020   GLUCOSE 101 (H) 05/31/2020   CHOL 208 (H) 03/30/2019   TRIG 112 03/23/2020   HDL 34.40 (L) 03/30/2019   LDLDIRECT 137.0 03/10/2017   LDLCALC 141 (H) 03/30/2019   ALT 17 05/31/2020   AST 18 05/31/2020   NA 140 05/31/2020   K 3.9 05/31/2020   CL 101 05/31/2020   CREATININE 1.05 05/31/2020   BUN 15 05/31/2020   CO2 29 05/31/2020   TSH 1.169 03/26/2020   PSA 0.09 (L) 03/30/2019   INR 1.7 (A) 08/28/2020   HGBA1C 6.0 12/21/2019    CT ANGIO CHEST PE W OR WO CONTRAST  Result Date: 03/23/2020 CLINICAL DATA:  Shortness of breath. COVID positive, recent hospitalization post discharge yesterday. EXAM: CT ANGIOGRAPHY CHEST WITH CONTRAST TECHNIQUE: Multidetector CT imaging of the chest was performed using the standard protocol during bolus administration of intravenous contrast. Multiplanar CT image reconstructions and MIPs were obtained to evaluate the vascular anatomy.  CONTRAST:  175mL OMNIPAQUE IOHEXOL 350 MG/ML SOLN COMPARISON:  Radiograph earlier today.  Chest CTA 07/04/2013 FINDINGS: Cardiovascular: There are no filling defects within the pulmonary arteries to suggest pulmonary embolus. There is significant breathing motion artifact, evaluation is diagnostic to the proximal segmental level. Cannot assess distal segmental and subsegmental branches. Thoracic aorta is normal in caliber. There is no aortic dissection. Moderate aortic atherosclerosis. Borderline cardiomegaly. There mitral annulus calcifications. Coronary artery calcifications. No pericardial effusion. Small amount of contrast  refluxes into the hepatic veins and IVC. Mediastinum/Nodes: Small hiatal hernia. Small paratracheal and right infrahilar nodes not enlarged by size criteria. No suspicious thyroid nodule. No pneumomediastinum. Lungs/Pleura: Multifocal patchy and geographic ground-glass opacities within all lobes of both lungs. Some of these opacities appear coalescing peripherally. No convincing septal thickening or pulmonary edema. No pneumothorax. Trachea and central bronchi are patent. No pleural fluid. The previous right lower lobe pulmonary nodule is obscured by opacities on the current exam. Upper Abdomen: Minimal contrast refluxing into the hepatic veins and IVC. Suspected hepatic steatosis. Cholecystectomy. Multiple cysts in the left kidney, incompletely characterized on this chest CTA, but grossly stable from 2015. Musculoskeletal: Multilevel degenerative change throughout the spine. There are no acute or suspicious osseous abnormalities. Review of the MIP images confirms the above findings. IMPRESSION: 1. No pulmonary embolus allowing for significant breathing motion artifact. 2. Multifocal patchy and geographic ground-glass opacities within all lobes of both lungs, pattern consistent with COVID-19 pneumonia. 3. Small amount of contrast refluxing into the hepatic veins and IVC, suggesting elevated  right heart pressures. 4. Small hiatal hernia. 5. Aortic atherosclerosis and coronary artery calcifications. Aortic Atherosclerosis (ICD10-I70.0). Electronically Signed   By: Keith Rake M.D.   On: 03/23/2020 17:05   DG Chest Portable 1 View  Result Date: 03/23/2020 CLINICAL DATA:  Shortness of breath EXAM: PORTABLE CHEST 1 VIEW COMPARISON:  03/17/2020 FINDINGS: Cardiomegaly. Bilateral indistinct airspace opacities that have progressed. No clear Kerley lines. No pleural effusion or pneumothorax. IMPRESSION: Progressive pulmonary infiltrates. Electronically Signed   By: Monte Fantasia M.D.   On: 03/23/2020 07:18   ECHOCARDIOGRAM COMPLETE  Result Date: 03/24/2020    ECHOCARDIOGRAM REPORT   Patient Name:   ALONZO OWCZARZAK Date of Exam: 03/24/2020 Medical Rec #:  [id number]         Height:       71.0 in Accession #:    [id number]        Weight:       324.7 lb Date of Birth:  1951/03/30         BSA:          2.591 m Patient Age:    57 years          BP:           140/101 mmHg Patient Gender: M                 HR:           115 bpm. Exam Location:  Inpatient Procedure: 2D Echo, Cardiac Doppler and Color Doppler Indications:     I42.9 Cardiomyopathy (unspecified)  History:         Patient has prior history of Echocardiogram examinations, most                  recent 05/06/2018. Risk Factors:Morbid Obesity. Covid 19                  positive.  Sonographer:     Merrie Roof RDCS Referring Phys:  [place] Diagnosing Phys: Carlyle Dolly MD IMPRESSIONS  1. Left ventricular ejection fraction, by estimation, is 55 to 60%. The left ventricle has normal function. The left ventricle has no regional wall motion abnormalities. Left ventricular diastolic parameters are indeterminate.  2. Right ventricular systolic function is normal. The right ventricular size is normal.  3. Left atrial size was mild to moderately dilated.  4. The mitral valve is normal in structure. No evidence of mitral  valve regurgitation.  No evidence of mitral stenosis.  5. The aortic valve has an indeterminant number of cusps. There is moderate calcification of the aortic valve. There is moderate thickening of the aortic valve. Aortic valve regurgitation is not visualized. Mild to moderate aortic valve stenosis.Aortic valve mean gradient measures 19.3 mmHg. Aortic valve peak gradient measures 32.1 mmHg. Aortic valve area, by VTI measures 1.26 cm. FINDINGS  Left Ventricle: Left ventricular ejection fraction, by estimation, is 55 to 60%. The left ventricle has normal function. The left ventricle has no regional wall motion abnormalities. The left ventricular internal cavity size was normal in size. There is  no left ventricular hypertrophy. Left ventricular diastolic parameters are indeterminate. Right Ventricle: The right ventricular size is normal. No increase in right ventricular wall thickness. Right ventricular systolic function is normal. Left Atrium: Left atrial size was mild to moderately dilated. Right Atrium: Right atrial size was normal in size. Pericardium: There is no evidence of pericardial effusion. Mitral Valve: The mitral valve is normal in structure. No evidence of mitral valve regurgitation. No evidence of mitral valve stenosis. Tricuspid Valve: The tricuspid valve is normal in structure. Tricuspid valve regurgitation is not demonstrated. No evidence of tricuspid stenosis. Aortic Valve: The aortic valve has an indeterminant number of cusps. There is moderate calcification of the aortic valve. There is moderate thickening of the aortic valve. There is moderate aortic valve annular calcification. Aortic valve regurgitation is not visualized. Mild to moderate aortic stenosis is present. Aortic valve mean gradient measures 19.3 mmHg. Aortic valve peak gradient measures 32.1 mmHg. Aortic valve area, by VTI measures 1.26 cm. Pulmonic Valve: The pulmonic valve was not well visualized. Pulmonic valve regurgitation is not visualized. No  evidence of pulmonic stenosis. Aorta: The aortic root is normal in size and structure. Pulmonary Artery: Indeterminant PASP, inadequate TR jet. IAS/Shunts: The interatrial septum was not well visualized.  LEFT VENTRICLE PLAX 2D LVIDd:         4.70 cm LVIDs:         3.60 cm LV PW:         1.00 cm LV IVS:        0.90 cm LVOT diam:     2.10 cm LV SV:         64 LV SV Index:   25 LVOT Area:     3.46 cm  LV Volumes (MOD) LV vol d, MOD A4C: 86.8 ml LV vol s, MOD A4C: 46.1 ml LV SV MOD A4C:     86.8 ml RIGHT VENTRICLE RV Basal diam:  4.10 cm RV Mid diam:    3.40 cm RV S prime:     14.70 cm/s TAPSE (M-mode): 1.8 cm LEFT ATRIUM            Index       RIGHT ATRIUM           Index LA diam:      4.90 cm  1.89 cm/m  RA Area:     23.50 cm LA Vol (A2C): 103.0 ml 39.75 ml/m RA Volume:   60.10 ml  23.20 ml/m LA Vol (A4C): 82.2 ml  31.73 ml/m  AORTIC VALVE AV Area (Vmax):    1.34 cm AV Area (Vmean):   1.21 cm AV Area (VTI):     1.26 cm AV Vmax:           283.33 cm/s AV Vmean:          208.667 cm/s AV VTI:  0.505 m AV Peak Grad:      32.1 mmHg AV Mean Grad:      19.3 mmHg LVOT Vmax:         110.00 cm/s LVOT Vmean:        73.100 cm/s LVOT VTI:          0.184 m LVOT/AV VTI ratio: 0.36  AORTA Ao Root diam: 3.80 cm  SHUNTS Systemic VTI:  0.18 m Systemic Diam: 2.10 cm Carlyle Dolly MD Electronically signed by Carlyle Dolly MD Signature Date/Time: 03/24/2020/3:47:49 PM    Final (Updated)     Assessment & Plan:   There are no diagnoses linked to this encounter.   No orders of the defined types were placed in this encounter.    Follow-up: No follow-ups on file.  Walker Kehr, MD

## 2020-08-29 NOTE — Assessment & Plan Note (Signed)
On Vit B12 

## 2020-09-11 ENCOUNTER — Ambulatory Visit (INDEPENDENT_AMBULATORY_CARE_PROVIDER_SITE_OTHER): Payer: MEDICARE | Admitting: General Practice

## 2020-09-11 ENCOUNTER — Other Ambulatory Visit: Payer: Self-pay

## 2020-09-11 DIAGNOSIS — Z86711 Personal history of pulmonary embolism: Secondary | ICD-10-CM

## 2020-09-11 DIAGNOSIS — Z7901 Long term (current) use of anticoagulants: Secondary | ICD-10-CM | POA: Diagnosis not present

## 2020-09-11 LAB — POCT INR: INR: 2.4 (ref 2.0–3.0)

## 2020-09-11 NOTE — Patient Instructions (Signed)
Pre visit review using our clinic review tool, if applicable. No additional management support is needed unless otherwise documented below in the visit note.  Continue to take 3 mg daily except take 6 mg on Mondays and Thursdays.  Re-check in 4 weeks.

## 2020-09-17 NOTE — Progress Notes (Signed)
Medical screening examination/treatment/procedure(s) were performed by non-physician practitioner and as supervising physician I was immediately available for consultation/collaboration. I agree with above. Adaria Hole, MD   

## 2020-09-18 ENCOUNTER — Other Ambulatory Visit: Payer: Self-pay | Admitting: Internal Medicine

## 2020-09-18 DIAGNOSIS — L659 Nonscarring hair loss, unspecified: Secondary | ICD-10-CM

## 2020-09-20 DIAGNOSIS — M79641 Pain in right hand: Secondary | ICD-10-CM | POA: Diagnosis not present

## 2020-09-20 DIAGNOSIS — M71341 Other bursal cyst, right hand: Secondary | ICD-10-CM | POA: Diagnosis not present

## 2020-09-24 ENCOUNTER — Other Ambulatory Visit (INDEPENDENT_AMBULATORY_CARE_PROVIDER_SITE_OTHER): Payer: MEDICARE

## 2020-09-24 DIAGNOSIS — L659 Nonscarring hair loss, unspecified: Secondary | ICD-10-CM | POA: Diagnosis not present

## 2020-09-24 LAB — TSH: TSH: 19.17 u[IU]/mL — ABNORMAL HIGH (ref 0.35–4.50)

## 2020-09-24 LAB — T4, FREE: Free T4: 0.7 ng/dL (ref 0.60–1.60)

## 2020-09-24 LAB — T3, FREE: T3, Free: 2.9 pg/mL (ref 2.3–4.2)

## 2020-09-27 DIAGNOSIS — M79641 Pain in right hand: Secondary | ICD-10-CM | POA: Diagnosis not present

## 2020-10-02 DIAGNOSIS — M71341 Other bursal cyst, right hand: Secondary | ICD-10-CM | POA: Diagnosis not present

## 2020-10-09 ENCOUNTER — Ambulatory Visit (INDEPENDENT_AMBULATORY_CARE_PROVIDER_SITE_OTHER): Payer: MEDICARE | Admitting: General Practice

## 2020-10-09 ENCOUNTER — Other Ambulatory Visit: Payer: Self-pay

## 2020-10-09 DIAGNOSIS — Z7901 Long term (current) use of anticoagulants: Secondary | ICD-10-CM

## 2020-10-09 DIAGNOSIS — Z86711 Personal history of pulmonary embolism: Secondary | ICD-10-CM

## 2020-10-09 LAB — POCT INR: INR: 2.2 (ref 2.0–3.0)

## 2020-10-09 NOTE — Patient Instructions (Signed)
Pre visit review using our clinic review tool, if applicable. No additional management support is needed unless otherwise documented below in the visit note.  Continue to take 3 mg daily except take 6 mg on Mondays and Thursdays.  Re-check in 6 weeks.

## 2020-10-09 NOTE — Progress Notes (Signed)
Medical screening examination/treatment/procedure(s) were performed by non-physician practitioner and as supervising physician I was immediately available for consultation/collaboration. I agree with above. Debbie Yearick, MD  

## 2020-11-13 ENCOUNTER — Encounter: Payer: Self-pay | Admitting: Internal Medicine

## 2020-11-13 ENCOUNTER — Telehealth (INDEPENDENT_AMBULATORY_CARE_PROVIDER_SITE_OTHER): Payer: MEDICARE | Admitting: Internal Medicine

## 2020-11-13 DIAGNOSIS — J320 Chronic maxillary sinusitis: Secondary | ICD-10-CM | POA: Diagnosis not present

## 2020-11-13 DIAGNOSIS — J4520 Mild intermittent asthma, uncomplicated: Secondary | ICD-10-CM

## 2020-11-13 DIAGNOSIS — J209 Acute bronchitis, unspecified: Secondary | ICD-10-CM | POA: Diagnosis not present

## 2020-11-13 MED ORDER — METHYLPREDNISOLONE 4 MG PO TBPK: ORAL_TABLET | ORAL | 0 refills | Status: DC

## 2020-11-13 MED ORDER — DOXYCYCLINE HYCLATE 100 MG PO TABS: 100.0000 mg | ORAL_TABLET | Freq: Two times a day (BID) | ORAL | 0 refills | Status: DC

## 2020-11-13 MED ORDER — PROMETHAZINE-CODEINE 6.25-10 MG/5ML PO SYRP: 5.0000 mL | ORAL_SOLUTION | ORAL | 0 refills | Status: DC | PRN

## 2020-11-13 NOTE — Assessment & Plan Note (Signed)
Doxycycline p.o.  Promethazine with codeine cough syrup.  To ER if worse.  Check O2 sats daily

## 2020-11-13 NOTE — Assessment & Plan Note (Signed)
Exacerbation.  Use albuterol HHN every 4 hours.  Medrol Dosepak.  Go to ER if worse

## 2020-11-13 NOTE — Progress Notes (Signed)
Virtual Visit via Video Note  I connected with Robert [name] on 11/13/20 at 11:20 AM EDT by a video enabled telemedicine application and verified that I am speaking with the correct person using two identifiers.   I discussed the limitations of evaluation and management by telemedicine and the availability of in person appointments. The patient expressed understanding and agreed to proceed.  I was located at our [facility] office. The patient was at home. There was no one else present in the visit.   History of Present Illness:   C/o runny nose, sinus congestion, wheezing, productive cough, some shortness of breath. No abdominal pain, diarrhea, constipation.  Robert [name] was using albuterol nebulizer twice a day.  His COVID test was negative.   Observations/Objective: The patient appears to be in no acute distress, looks tired.  Assessment and Plan:  See my Assessment and Plan. Follow Up Instructions:    I discussed the assessment and treatment plan with the patient. The patient was provided an opportunity to ask questions and all were answered. The patient agreed with the plan and demonstrated an understanding of the instructions.   The patient was advised to call back or seek an in-person evaluation if the symptoms worsen or if the condition fails to improve as anticipated.  I provided face-to-face time during this encounter. We were at different locations.   Walker Kehr, MD

## 2020-11-13 NOTE — Assessment & Plan Note (Signed)
Exacerbation.  Doxycycline for 10 days.  Candace Cruise

## 2020-11-20 ENCOUNTER — Other Ambulatory Visit: Payer: Self-pay

## 2020-11-20 ENCOUNTER — Ambulatory Visit (INDEPENDENT_AMBULATORY_CARE_PROVIDER_SITE_OTHER): Payer: MEDICARE | Admitting: General Practice

## 2020-11-20 DIAGNOSIS — Z7901 Long term (current) use of anticoagulants: Secondary | ICD-10-CM

## 2020-11-20 DIAGNOSIS — Z86711 Personal history of pulmonary embolism: Secondary | ICD-10-CM

## 2020-11-20 LAB — POCT INR: INR: 3.6 — AB (ref 2.0–3.0)

## 2020-11-20 NOTE — Patient Instructions (Addendum)
Pre visit review using our clinic review tool, if applicable. No additional management support is needed unless otherwise documented below in the visit note.  Skip dosage today and then take 3 mg until finished with doxycycline.  After finishing with doxy continue to take 3 mg daily except take 6 mg on Mondays and Thursdays.  Re-check in 6 weeks.

## 2020-11-20 NOTE — Progress Notes (Signed)
Medical screening examination/treatment/procedure(s) were performed by non-physician practitioner and as supervising physician I was immediately available for consultation/collaboration. I agree with above. Shalae Belmonte, MD   

## 2020-11-29 ENCOUNTER — Ambulatory Visit: Payer: MEDICARE | Admitting: Internal Medicine

## 2021-01-01 ENCOUNTER — Other Ambulatory Visit: Payer: Self-pay

## 2021-01-01 ENCOUNTER — Ambulatory Visit (INDEPENDENT_AMBULATORY_CARE_PROVIDER_SITE_OTHER): Payer: MEDICARE | Admitting: General Practice

## 2021-01-01 DIAGNOSIS — Z86711 Personal history of pulmonary embolism: Secondary | ICD-10-CM | POA: Diagnosis not present

## 2021-01-01 DIAGNOSIS — Z7901 Long term (current) use of anticoagulants: Secondary | ICD-10-CM

## 2021-01-01 LAB — POCT INR: INR: 2 (ref 2.0–3.0)

## 2021-01-01 NOTE — Patient Instructions (Addendum)
Pre visit review using our clinic review tool, if applicable. No additional management support is needed unless otherwise documented below in the visit note.  Continue to take 3 mg daily except take 6 mg on Mondays and Thursdays.  Re-check in 6 weeks.

## 2021-01-06 ENCOUNTER — Other Ambulatory Visit: Payer: Self-pay | Admitting: Internal Medicine

## 2021-01-14 ENCOUNTER — Other Ambulatory Visit: Payer: Self-pay | Admitting: Internal Medicine

## 2021-01-14 ENCOUNTER — Ambulatory Visit: Payer: MEDICARE | Admitting: Internal Medicine

## 2021-01-14 DIAGNOSIS — Z7901 Long term (current) use of anticoagulants: Secondary | ICD-10-CM

## 2021-02-12 ENCOUNTER — Other Ambulatory Visit: Payer: Self-pay

## 2021-02-12 ENCOUNTER — Ambulatory Visit (INDEPENDENT_AMBULATORY_CARE_PROVIDER_SITE_OTHER): Payer: MEDICARE

## 2021-02-12 DIAGNOSIS — Z7901 Long term (current) use of anticoagulants: Secondary | ICD-10-CM | POA: Diagnosis not present

## 2021-02-12 LAB — POCT INR: INR: 2.3 (ref 2.0–3.0)

## 2021-02-12 NOTE — Patient Instructions (Addendum)
Pre visit review using our clinic review tool, if applicable. No additional management support is needed unless otherwise documented below in the visit note.  Continue to take 3 mg daily except take 6 mg on Mondays and Thursdays.  Re-check in 6 weeks.

## 2021-02-18 DIAGNOSIS — S46211A Strain of muscle, fascia and tendon of other parts of biceps, right arm, initial encounter: Secondary | ICD-10-CM | POA: Diagnosis not present

## 2021-02-19 ENCOUNTER — Encounter: Payer: Self-pay | Admitting: Internal Medicine

## 2021-02-19 ENCOUNTER — Other Ambulatory Visit: Payer: Self-pay

## 2021-02-19 ENCOUNTER — Other Ambulatory Visit: Payer: Self-pay | Admitting: Orthopedic Surgery

## 2021-02-19 ENCOUNTER — Ambulatory Visit (INDEPENDENT_AMBULATORY_CARE_PROVIDER_SITE_OTHER): Payer: MEDICARE | Admitting: Internal Medicine

## 2021-02-19 DIAGNOSIS — Z7901 Long term (current) use of anticoagulants: Secondary | ICD-10-CM | POA: Diagnosis not present

## 2021-02-19 DIAGNOSIS — M25521 Pain in right elbow: Secondary | ICD-10-CM

## 2021-02-19 DIAGNOSIS — M545 Low back pain, unspecified: Secondary | ICD-10-CM | POA: Diagnosis not present

## 2021-02-19 DIAGNOSIS — G8929 Other chronic pain: Secondary | ICD-10-CM | POA: Diagnosis not present

## 2021-02-19 DIAGNOSIS — W1800XA Striking against unspecified object with subsequent fall, initial encounter: Secondary | ICD-10-CM | POA: Diagnosis not present

## 2021-02-19 DIAGNOSIS — E538 Deficiency of other specified B group vitamins: Secondary | ICD-10-CM | POA: Diagnosis not present

## 2021-02-19 DIAGNOSIS — M25511 Pain in right shoulder: Secondary | ICD-10-CM | POA: Diagnosis not present

## 2021-02-19 MED ORDER — NORTRIPTYLINE HCL 25 MG PO CAPS: 25.0000 mg | ORAL_CAPSULE | Freq: Every day | ORAL | 3 refills | Status: DC

## 2021-02-19 MED ORDER — OXYCODONE HCL 5 MG PO TABS: 5.0000 mg | ORAL_TABLET | Freq: Four times a day (QID) | ORAL | 0 refills | Status: DC | PRN

## 2021-02-19 NOTE — Assessment & Plan Note (Addendum)
Worse On nortriptyline - will increase the dose

## 2021-02-19 NOTE — Assessment & Plan Note (Addendum)
Pt fell in the garage No LOC Seeing dr Noemi Chapel Pt declined head CT. No sx's

## 2021-02-19 NOTE — Assessment & Plan Note (Signed)
Cont w/Coumadin °

## 2021-02-19 NOTE — Assessment & Plan Note (Addendum)
R shoulder S/p fall 8/22 Seeing Dr Noemi Chapel. MRI was ordered Oxy prn  Potential benefits of a short/long term opioids use as well as potential risks (i.e. addiction risk, apnea etc) and complications (i.e. Somnolence, constipation and others) were explained to the patient and were aknowledged.

## 2021-02-19 NOTE — Assessment & Plan Note (Signed)
Cont w/Vit B12 

## 2021-02-19 NOTE — Progress Notes (Signed)
Subjective:  Patient ID: Robert [name], male    DOB: 07-Dec-1950  Age: 70 y.o. MRN: [id number]  CC: Follow-up (3 month f/u)   HPI Robert [name] presents for a fall on Sat last week in the garage. He was seen at [facility] shoulder pain w/o a fx. MRI was ordered. No LOC. No HA, no n/v  F/u DVT, obesity, LBP   Outpatient Medications Prior to Visit  Medication Sig Dispense Refill   albuterol (PROVENTIL) (2.5 MG/3ML) 0.083% nebulizer solution Take 3 mLs (2.5 mg total) by nebulization every 4 (four) hours as needed for wheezing or shortness of breath. Dx  J45.909 150 mL 3   albuterol (VENTOLIN HFA) 108 (90 Base) MCG/ACT inhaler Inhale 2 puffs into the lungs every 6 (six) hours as needed for wheezing or shortness of breath. 6.7 g 0   Cholecalciferol (VITAMIN D3) 125 MCG (5000 UT) CAPS Take 1 capsule (5,000 Units total) by mouth daily. 100 capsule 3   fish oil-omega-3 fatty acids 1000 MG capsule Take 2 g by mouth every morning.     furosemide (LASIX) 20 MG tablet TAKE 1 TABLET DAILY 90 tablet 3   glucosamine-chondroitin 500-400 MG tablet Take 1 tablet by mouth every morning.     nortriptyline (PAMELOR) 10 MG capsule TAKE 1 CAPSULE EVERY EVENING 90 capsule 3   predniSONE (DELTASONE) 5 MG tablet TAKE 1 TAB DAILY WITH BREAKFAST. CONTINUE YOUR PREVIOUS HOME DOSE '5MG'$  OF PREDNISONE DAILY AS BEFORE. 90 tablet 1   warfarin (COUMADIN) 3 MG tablet TAKE 1 TABLET DAILY OR TAKE AS DIRECTED BY ANTICOAGULATION CLINIC 90 tablet 1   doxycycline (VIBRA-TABS) 100 MG tablet Take 1 tablet (100 mg total) by mouth 2 (two) times daily. (Patient not taking: Reported on 02/19/2021) 20 tablet 0   methylPREDNISolone (MEDROL DOSEPAK) 4 MG TBPK tablet As directed (Patient not taking: Reported on 02/19/2021) 21 tablet 0   promethazine-codeine (PHENERGAN WITH CODEINE) 6.25-10 MG/5ML syrup Take 5 mLs by mouth every 4 (four) hours as needed. (Patient not taking: Reported on 02/19/2021) 300 mL 0   No  facility-administered medications prior to visit.    ROS: Review of Systems  Constitutional:  Positive for fatigue. Negative for appetite change and unexpected weight change.  HENT:  Negative for congestion, nosebleeds, sneezing, sore throat and trouble swallowing.   Eyes:  Negative for itching and visual disturbance.  Respiratory:  Positive for shortness of breath. Negative for cough.   Cardiovascular:  Negative for chest pain, palpitations and leg swelling.  Gastrointestinal:  Negative for abdominal distention, blood in stool, diarrhea and nausea.  Genitourinary:  Negative for frequency and hematuria.  Musculoskeletal:  Positive for arthralgias, back pain, gait problem and myalgias. Negative for joint swelling and neck pain.  Skin:  Negative for rash.  Neurological:  Positive for headaches. Negative for dizziness, tremors, speech difficulty and weakness.  Psychiatric/Behavioral:  Negative for agitation, decreased concentration, dysphoric mood, sleep disturbance and suicidal ideas. The patient is not nervous/anxious.    Objective:  BP (!) 142/90 (BP Location: Left Arm)   Pulse 97   Temp 98.8 F (37.1 C) (Oral)   Ht '6\' 1"'$  (1.854 m)   Wt (!) 311 lb 12.8 oz (141.4 kg)   SpO2 95%   BMI 41.14 kg/m   BP Readings from Last 3 Encounters:  02/19/21 (!) 142/90  08/29/20 138/82  07/11/20 130/80    Wt Readings from Last 3 Encounters:  02/19/21 (!) 311 lb 12.8 oz (141.4 kg)  08/29/20 [name])  312 lb (141.5 kg)  07/11/20 (!) 315 lb (142.9 kg)    Physical Exam Constitutional:      General: He is not in acute distress.    Appearance: He is well-developed. He is obese.     Comments: NAD  Eyes:     Conjunctiva/sclera: Conjunctivae normal.     Pupils: Pupils are equal, round, and reactive to light.  Neck:     Thyroid: No thyromegaly.     Vascular: No JVD.  Cardiovascular:     Rate and Rhythm: Normal rate and regular rhythm.     Heart sounds: Normal heart sounds. No murmur heard.   No  friction rub. No gallop.  Pulmonary:     Effort: Pulmonary effort is normal. No respiratory distress.     Breath sounds: Normal breath sounds. No wheezing or rales.  Chest:     Chest wall: No tenderness.  Abdominal:     General: Bowel sounds are normal. There is no distension.     Palpations: Abdomen is soft. There is no mass.     Tenderness: There is no abdominal tenderness. There is no guarding or rebound.  Musculoskeletal:        General: Tenderness present. Normal range of motion.     Cervical back: Normal range of motion.  Lymphadenopathy:     Cervical: No cervical adenopathy.  Skin:    General: Skin is warm and dry.     Findings: No rash.  Neurological:     Mental Status: He is alert and oriented to person, place, and time.     Cranial Nerves: No cranial nerve deficit.     Motor: No abnormal muscle tone.     Coordination: Coordination abnormal.     Gait: Gait abnormal.     Deep Tendon Reflexes: Reflexes are normal and symmetric.  Psychiatric:        Behavior: Behavior normal.        Thought Content: Thought content normal.        Judgment: Judgment normal.  Antalgic gait R arm in a sling R elbow w/a bruise  Lab Results  Component Value Date   WBC 11.6 (H) 05/31/2020   HGB 14.0 05/31/2020   HCT 42.2 05/31/2020   PLT 330.0 05/31/2020   GLUCOSE 106 (H) 08/29/2020   CHOL 208 (H) 03/30/2019   TRIG 112 03/23/2020   HDL 34.40 (L) 03/30/2019   LDLDIRECT 137.0 03/10/2017   LDLCALC 141 (H) 03/30/2019   ALT 19 08/29/2020   AST 18 08/29/2020   NA 137 08/29/2020   K 4.7 08/29/2020   CL 97 08/29/2020   CREATININE 1.36 08/29/2020   BUN 20 08/29/2020   CO2 30 08/29/2020   TSH 19.17 (H) 09/24/2020   PSA 0.09 (L) 03/30/2019   INR 2.3 02/12/2021   HGBA1C 6.0 12/21/2019    CT ANGIO CHEST PE W OR WO CONTRAST  Result Date: 03/23/2020 CLINICAL DATA:  Shortness of breath. COVID positive, recent hospitalization post discharge yesterday. EXAM: CT ANGIOGRAPHY CHEST WITH  CONTRAST TECHNIQUE: Multidetector CT imaging of the chest was performed using the standard protocol during bolus administration of intravenous contrast. Multiplanar CT image reconstructions and MIPs were obtained to evaluate the vascular anatomy. CONTRAST:  13m OMNIPAQUE IOHEXOL 350 MG/ML SOLN COMPARISON:  Radiograph earlier today.  Chest CTA 07/04/2013 FINDINGS: Cardiovascular: There are no filling defects within the pulmonary arteries to suggest pulmonary embolus. There is significant breathing motion artifact, evaluation is diagnostic to the proximal segmental level. Cannot assess distal  segmental and subsegmental branches. Thoracic aorta is normal in caliber. There is no aortic dissection. Moderate aortic atherosclerosis. Borderline cardiomegaly. There mitral annulus calcifications. Coronary artery calcifications. No pericardial effusion. Small amount of contrast refluxes into the hepatic veins and IVC. Mediastinum/Nodes: Small hiatal hernia. Small paratracheal and right infrahilar nodes not enlarged by size criteria. No suspicious thyroid nodule. No pneumomediastinum. Lungs/Pleura: Multifocal patchy and geographic ground-glass opacities within all lobes of both lungs. Some of these opacities appear coalescing peripherally. No convincing septal thickening or pulmonary edema. No pneumothorax. Trachea and central bronchi are patent. No pleural fluid. The previous right lower lobe pulmonary nodule is obscured by opacities on the current exam. Upper Abdomen: Minimal contrast refluxing into the hepatic veins and IVC. Suspected hepatic steatosis. Cholecystectomy. Multiple cysts in the left kidney, incompletely characterized on this chest CTA, but grossly stable from 2015. Musculoskeletal: Multilevel degenerative change throughout the spine. There are no acute or suspicious osseous abnormalities. Review of the MIP images confirms the above findings. IMPRESSION: 1. No pulmonary embolus allowing for significant  breathing motion artifact. 2. Multifocal patchy and geographic ground-glass opacities within all lobes of both lungs, pattern consistent with COVID-19 pneumonia. 3. Small amount of contrast refluxing into the hepatic veins and IVC, suggesting elevated right heart pressures. 4. Small hiatal hernia. 5. Aortic atherosclerosis and coronary artery calcifications. Aortic Atherosclerosis (ICD10-I70.0). Electronically Signed   By: Keith Rake M.D.   On: 03/23/2020 17:05   DG Chest Portable 1 View  Result Date: 03/23/2020 CLINICAL DATA:  Shortness of breath EXAM: PORTABLE CHEST 1 VIEW COMPARISON:  03/17/2020 FINDINGS: Cardiomegaly. Bilateral indistinct airspace opacities that have progressed. No clear Kerley lines. No pleural effusion or pneumothorax. IMPRESSION: Progressive pulmonary infiltrates. Electronically Signed   By: Monte Fantasia M.D.   On: 03/23/2020 07:18   ECHOCARDIOGRAM COMPLETE  Result Date: 03/24/2020    ECHOCARDIOGRAM REPORT   Patient Name:   Robert [name] Date of Exam: 03/24/2020 Medical Rec #:  [id number]         Height:       71.0 in Accession #:    [id number]        Weight:       324.7 lb Date of Birth:  [birth year]         BSA:          2.591 m Patient Age:    71 years          BP:           140/101 mmHg Patient Gender: M                 HR:           115 bpm. Exam Location:  Inpatient Procedure: 2D Echo, Cardiac Doppler and Color Doppler Indications:     I42.9 Cardiomyopathy (unspecified)  History:         Patient has prior history of Echocardiogram examinations, most                  recent 05/06/2018. Risk Factors:Morbid Obesity. Covid 19                  positive.  Sonographer:     Merrie Roof RDCS Referring Phys:  [place] Diagnosing Phys: Carlyle Dolly MD IMPRESSIONS  1. Left ventricular ejection fraction, by estimation, is 55 to 60%. The left ventricle has normal function. The left ventricle has no regional wall motion abnormalities. Left ventricular diastolic  parameters are indeterminate.  2.  Right ventricular systolic function is normal. The right ventricular size is normal.  3. Left atrial size was mild to moderately dilated.  4. The mitral valve is normal in structure. No evidence of mitral valve regurgitation. No evidence of mitral stenosis.  5. The aortic valve has an indeterminant number of cusps. There is moderate calcification of the aortic valve. There is moderate thickening of the aortic valve. Aortic valve regurgitation is not visualized. Mild to moderate aortic valve stenosis.Aortic valve mean gradient measures 19.3 mmHg. Aortic valve peak gradient measures 32.1 mmHg. Aortic valve area, by VTI measures 1.26 cm. FINDINGS  Left Ventricle: Left ventricular ejection fraction, by estimation, is 55 to 60%. The left ventricle has normal function. The left ventricle has no regional wall motion abnormalities. The left ventricular internal cavity size was normal in size. There is  no left ventricular hypertrophy. Left ventricular diastolic parameters are indeterminate. Right Ventricle: The right ventricular size is normal. No increase in right ventricular wall thickness. Right ventricular systolic function is normal. Left Atrium: Left atrial size was mild to moderately dilated. Right Atrium: Right atrial size was normal in size. Pericardium: There is no evidence of pericardial effusion. Mitral Valve: The mitral valve is normal in structure. No evidence of mitral valve regurgitation. No evidence of mitral valve stenosis. Tricuspid Valve: The tricuspid valve is normal in structure. Tricuspid valve regurgitation is not demonstrated. No evidence of tricuspid stenosis. Aortic Valve: The aortic valve has an indeterminant number of cusps. There is moderate calcification of the aortic valve. There is moderate thickening of the aortic valve. There is moderate aortic valve annular calcification. Aortic valve regurgitation is not visualized. Mild to moderate aortic stenosis is  present. Aortic valve mean gradient measures 19.3 mmHg. Aortic valve peak gradient measures 32.1 mmHg. Aortic valve area, by VTI measures 1.26 cm. Pulmonic Valve: The pulmonic valve was not well visualized. Pulmonic valve regurgitation is not visualized. No evidence of pulmonic stenosis. Aorta: The aortic root is normal in size and structure. Pulmonary Artery: Indeterminant PASP, inadequate TR jet. IAS/Shunts: The interatrial septum was not well visualized.  LEFT VENTRICLE PLAX 2D LVIDd:         4.70 cm LVIDs:         3.60 cm LV PW:         1.00 cm LV IVS:        0.90 cm LVOT diam:     2.10 cm LV SV:         64 LV SV Index:   25 LVOT Area:     3.46 cm  LV Volumes (MOD) LV vol d, MOD A4C: 86.8 ml LV vol s, MOD A4C: 46.1 ml LV SV MOD A4C:     86.8 ml RIGHT VENTRICLE RV Basal diam:  4.10 cm RV Mid diam:    3.40 cm RV S prime:     14.70 cm/s TAPSE (M-mode): 1.8 cm LEFT ATRIUM            Index       RIGHT ATRIUM           Index LA diam:      4.90 cm  1.89 cm/m  RA Area:     23.50 cm LA Vol (A2C): 103.0 ml 39.75 ml/m RA Volume:   60.10 ml  23.20 ml/m LA Vol (A4C): 82.2 ml  31.73 ml/m  AORTIC VALVE AV Area (Vmax):    1.34 cm AV Area (Vmean):   1.21 cm AV Area (VTI):  1.26 cm AV Vmax:           283.33 cm/s AV Vmean:          208.667 cm/s AV VTI:            0.505 m AV Peak Grad:      32.1 mmHg AV Mean Grad:      19.3 mmHg LVOT Vmax:         110.00 cm/s LVOT Vmean:        73.100 cm/s LVOT VTI:          0.184 m LVOT/AV VTI ratio: 0.36  AORTA Ao Root diam: 3.80 cm  SHUNTS Systemic VTI:  0.18 m Systemic Diam: 2.10 cm Carlyle Dolly MD Electronically signed by Carlyle Dolly MD Signature Date/Time: 03/24/2020/3:47:49 PM    Final (Updated)     Assessment & Plan:     Walker Kehr, MD

## 2021-02-24 ENCOUNTER — Ambulatory Visit
Admission: RE | Admit: 2021-02-24 | Discharge: 2021-02-24 | Disposition: A | Discharge status: Home / Self Care | Payer: MEDICARE | Source: Ambulatory Visit | Attending: Orthopedic Surgery | Admitting: Orthopedic Surgery

## 2021-02-24 ENCOUNTER — Other Ambulatory Visit: Payer: Self-pay

## 2021-02-24 DIAGNOSIS — M25421 Effusion, right elbow: Secondary | ICD-10-CM | POA: Diagnosis not present

## 2021-02-24 DIAGNOSIS — M25521 Pain in right elbow: Secondary | ICD-10-CM | POA: Diagnosis not present

## 2021-02-24 DIAGNOSIS — R600 Localized edema: Secondary | ICD-10-CM | POA: Diagnosis not present

## 2021-02-27 ENCOUNTER — Other Ambulatory Visit: Payer: No Typology Code available for payment source

## 2021-03-26 ENCOUNTER — Other Ambulatory Visit: Payer: Self-pay

## 2021-03-26 ENCOUNTER — Ambulatory Visit (INDEPENDENT_AMBULATORY_CARE_PROVIDER_SITE_OTHER): Payer: MEDICARE

## 2021-03-26 DIAGNOSIS — Z7901 Long term (current) use of anticoagulants: Secondary | ICD-10-CM | POA: Diagnosis not present

## 2021-03-26 LAB — POCT INR: INR: 1.9 — AB (ref 2.0–3.0)

## 2021-03-26 NOTE — Patient Instructions (Addendum)
Pre visit review using our clinic review tool, if applicable. No additional management support is needed unless otherwise documented below in the visit note.  Increase dose today to 6mg  and then continue to take 3 mg daily except take 6 mg on Mondays and Thursdays.  Re-check in 6 weeks.

## 2021-04-03 ENCOUNTER — Other Ambulatory Visit: Payer: Self-pay

## 2021-04-04 ENCOUNTER — Other Ambulatory Visit: Payer: Self-pay

## 2021-04-04 ENCOUNTER — Encounter: Payer: Self-pay | Admitting: Internal Medicine

## 2021-04-04 ENCOUNTER — Ambulatory Visit (INDEPENDENT_AMBULATORY_CARE_PROVIDER_SITE_OTHER): Payer: MEDICARE | Admitting: Internal Medicine

## 2021-04-04 VITALS — BP 140/82 | HR 82 | Temp 97.8°F | Ht 73.0 in | Wt 308.8 lb

## 2021-04-04 DIAGNOSIS — J320 Chronic maxillary sinusitis: Secondary | ICD-10-CM

## 2021-04-04 DIAGNOSIS — I10 Essential (primary) hypertension: Secondary | ICD-10-CM

## 2021-04-04 DIAGNOSIS — R739 Hyperglycemia, unspecified: Secondary | ICD-10-CM | POA: Diagnosis not present

## 2021-04-04 DIAGNOSIS — R7989 Other specified abnormal findings of blood chemistry: Secondary | ICD-10-CM | POA: Diagnosis not present

## 2021-04-04 DIAGNOSIS — Z86718 Personal history of other venous thrombosis and embolism: Secondary | ICD-10-CM

## 2021-04-04 DIAGNOSIS — Z7901 Long term (current) use of anticoagulants: Secondary | ICD-10-CM | POA: Diagnosis not present

## 2021-04-04 DIAGNOSIS — E538 Deficiency of other specified B group vitamins: Secondary | ICD-10-CM | POA: Diagnosis not present

## 2021-04-04 LAB — COMPREHENSIVE METABOLIC PANEL
ALT: 19 U/L (ref 0–53)
AST: 25 U/L (ref 0–37)
Albumin: 4.1 g/dL (ref 3.5–5.2)
Alkaline Phosphatase: 69 U/L (ref 39–117)
BUN: 19 mg/dL (ref 6–23)
CO2: 31 mEq/L (ref 19–32)
Calcium: 9.5 mg/dL (ref 8.4–10.5)
Chloride: 100 mEq/L (ref 96–112)
Creatinine, Ser: 1.18 mg/dL (ref 0.40–1.50)
GFR: 62.95 mL/min (ref >60.00)
Glucose, Bld: 96 mg/dL (ref 70–99)
Potassium: 4.6 mEq/L (ref 3.5–5.1)
Sodium: 138 mEq/L (ref 135–145)
Total Bilirubin: 0.7 mg/dL (ref 0.2–1.2)
Total Protein: 7.9 g/dL (ref 6.0–8.3)

## 2021-04-04 LAB — HEMOGLOBIN A1C: Hgb A1c MFr Bld: 6.1 % (ref 4.6–6.5)

## 2021-04-04 LAB — T4, FREE: Free T4: 0.58 ng/dL — ABNORMAL LOW (ref 0.60–1.60)

## 2021-04-04 LAB — TSH: TSH: 10.74 u[IU]/mL — ABNORMAL HIGH (ref 0.35–5.50)

## 2021-04-04 MED ORDER — DOXYCYCLINE HYCLATE 100 MG PO TABS: 100.0000 mg | ORAL_TABLET | Freq: Two times a day (BID) | ORAL | 0 refills | Status: DC

## 2021-04-04 NOTE — Assessment & Plan Note (Signed)
Wt Readings from Last 3 Encounters:  04/04/21 (!) 308 lb 12.8 oz (140.1 kg)  02/19/21 (!) 311 lb 12.8 oz (141.4 kg)  08/29/20 (!) 312 lb (141.5 kg)

## 2021-04-04 NOTE — Assessment & Plan Note (Signed)
Check FT4 and TSH

## 2021-04-04 NOTE — Assessment & Plan Note (Signed)
Compr socks Cont w/Coumadin

## 2021-04-04 NOTE — Assessment & Plan Note (Signed)
New. Rx: Doxy x 10 d

## 2021-04-04 NOTE — Progress Notes (Signed)
Subjective:  Patient ID: Robert [name], male    DOB: [birth year]  Age: 70 y.o. MRN: [id number]  CC: Follow-up (6 WEEK F/U)   HPI Robert [name] presents for sinusitis sx's - bloody d/c, h/o DVT, Vit B12 def  Outpatient Medications Prior to Visit  Medication Sig Dispense Refill   albuterol (PROVENTIL) (2.5 MG/3ML) 0.083% nebulizer solution Take 3 mLs (2.5 mg total) by nebulization every 4 (four) hours as needed for wheezing or shortness of breath. Dx  J45.909 150 mL 3   albuterol (VENTOLIN HFA) 108 (90 Base) MCG/ACT inhaler Inhale 2 puffs into the lungs every 6 (six) hours as needed for wheezing or shortness of breath. 6.7 g 0   Cholecalciferol (VITAMIN D3) 125 MCG (5000 UT) CAPS Take 1 capsule (5,000 Units total) by mouth daily. 100 capsule 3   fish oil-omega-3 fatty acids 1000 MG capsule Take 2 g by mouth every morning.     furosemide (LASIX) 20 MG tablet TAKE 1 TABLET DAILY 90 tablet 3   glucosamine-chondroitin 500-400 MG tablet Take 1 tablet by mouth every morning.     nortriptyline (PAMELOR) 25 MG capsule Take 1 capsule (25 mg total) by mouth at bedtime. 90 capsule 3   oxyCODONE (ROXICODONE) 5 MG immediate release tablet Take 1 tablet (5 mg total) by mouth every 6 (six) hours as needed for severe pain. 20 tablet 0   predniSONE (DELTASONE) 5 MG tablet TAKE 1 TAB DAILY WITH BREAKFAST. CONTINUE YOUR PREVIOUS HOME DOSE 5MG  OF PREDNISONE DAILY AS BEFORE. 90 tablet 1   warfarin (COUMADIN) 3 MG tablet TAKE 1 TABLET DAILY OR TAKE AS DIRECTED BY ANTICOAGULATION CLINIC 90 tablet 1   warfarin (COUMADIN) 6 MG tablet Take 6 mg by mouth. TAKE 1 TWICE A WEEK     No facility-administered medications prior to visit.    ROS: Review of Systems  Constitutional:  Negative for appetite change, fatigue and unexpected weight change.  HENT:  Negative for congestion, nosebleeds, sneezing, sore throat and trouble swallowing.   Eyes:  Negative for itching and visual disturbance.  Respiratory:   Negative for cough.   Cardiovascular:  Positive for leg swelling. Negative for chest pain and palpitations.  Gastrointestinal:  Negative for abdominal distention, blood in stool, diarrhea and nausea.  Genitourinary:  Negative for frequency and hematuria.  Musculoskeletal:  Positive for arthralgias, back pain and gait problem. Negative for joint swelling and neck pain.  Skin:  Negative for rash.  Neurological:  Negative for dizziness, tremors, speech difficulty and weakness.  Hematological:  Bruises/bleeds easily.  Psychiatric/Behavioral:  Negative for agitation, dysphoric mood, sleep disturbance and suicidal ideas. The patient is not nervous/anxious.    Objective:  BP 140/82 (BP Location: Left Arm)   Pulse 82   Temp 97.8 F (36.6 C) (Oral)   Ht 6\' 1"  (1.854 m)   Wt (!) 308 lb 12.8 oz (140.1 kg)   SpO2 98%   BMI 40.74 kg/m   BP Readings from Last 3 Encounters:  04/04/21 140/82  02/19/21 (!) 142/90  08/29/20 138/82    Wt Readings from Last 3 Encounters:  04/04/21 (!) 308 lb 12.8 oz (140.1 kg)  02/19/21 (!) 311 lb 12.8 oz (141.4 kg)  08/29/20 (!) 312 lb (141.5 kg)    Physical Exam Constitutional:      General: He is not in acute distress.    Appearance: He is well-developed. He is obese.     Comments: NAD  Eyes:     Conjunctiva/sclera: Conjunctivae normal.  Pupils: Pupils are equal, round, and reactive to light.  Neck:     Thyroid: No thyromegaly.     Vascular: No JVD.  Cardiovascular:     Rate and Rhythm: Normal rate and regular rhythm.     Heart sounds: Normal heart sounds. No murmur heard.   No friction rub. No gallop.  Pulmonary:     Effort: Pulmonary effort is normal. No respiratory distress.     Breath sounds: Normal breath sounds. No wheezing or rales.  Chest:     Chest wall: No tenderness.  Abdominal:     General: Bowel sounds are normal. There is no distension.     Palpations: Abdomen is soft. There is no mass.     Tenderness: There is no abdominal  tenderness. There is no guarding or rebound.  Musculoskeletal:        General: Tenderness present. Normal range of motion.     Cervical back: Normal range of motion.  Lymphadenopathy:     Cervical: No cervical adenopathy.  Skin:    General: Skin is warm and dry.     Findings: No rash.  Neurological:     Mental Status: He is alert and oriented to person, place, and time.     Cranial Nerves: No cranial nerve deficit.     Motor: No abnormal muscle tone.     Coordination: Coordination normal.     Gait: Gait abnormal.     Deep Tendon Reflexes: Reflexes are normal and symmetric.  Psychiatric:        Behavior: Behavior normal.        Thought Content: Thought content normal.        Judgment: Judgment normal.    Lab Results  Component Value Date   WBC 11.6 (H) 05/31/2020   HGB 14.0 05/31/2020   HCT 42.2 05/31/2020   PLT 330.0 05/31/2020   GLUCOSE 106 (H) 08/29/2020   CHOL 208 (H) 03/30/2019   TRIG 112 03/23/2020   HDL 34.40 (L) 03/30/2019   LDLDIRECT 137.0 03/10/2017   LDLCALC 141 (H) 03/30/2019   ALT 19 08/29/2020   AST 18 08/29/2020   NA 137 08/29/2020   K 4.7 08/29/2020   CL 97 08/29/2020   CREATININE 1.36 08/29/2020   BUN 20 08/29/2020   CO2 30 08/29/2020   TSH 19.17 (H) 09/24/2020   PSA 0.09 (L) 03/30/2019   INR 1.9 (A) 03/26/2021   HGBA1C 6.0 12/21/2019    MR ELBOW RIGHT WO CONTRAST  Result Date: 02/26/2021 CLINICAL DATA:  Right elbow pain and limited range of motion, fall last Saturday. EXAM: MRI OF THE RIGHT ELBOW WITHOUT CONTRAST TECHNIQUE: Multiplanar, multisequence MR imaging of the elbow was performed. No intravenous contrast was administered. COMPARISON:  None. FINDINGS: The patient was unable to fully extend the elbow during imaging. TENDONS Common forearm flexor origin: Mild proximal tendinopathy. Common forearm extensor origin: Moderate proximal tendinopathy as shown on image 11 series 7. Biceps: Unremarkable Triceps: Unremarkable LIGAMENTS Medial stabilizers:  Grossly intact Lateral stabilizers: Thickening and low-grade edema proximally in the radial collateral ligament could reflect a mild sprain. The lateral ulnar collateral ligament appears intact. Cartilage: Moderate thinning of articular cartilage along various surfaces including the capitellum, radial head, olecranon, and parts of the femoral trochlea, without a well-defined focal chondral defect. Joint: Small to moderate joint effusion. I do not observe a free osteochondral fragment. Cubital tunnel: Abnormal laxity or prior release of the cubital retinaculum with the ulnar nerve medially deviated from its expected position for  example on images 23 through 19 of series 5. Subtle edema in the ulnar nerve with a small amount of edema and a small fluid collection tracking in the proximal flexor carpi ulnaris muscle for example on image 12 series 6. Bones: Considerable spurring of the capitellum, radial head, olecranon, and trochlear groove. There is marrow edema in the prominent anterior spurring along the coronoid process which may be fragmented. Small erosion or degenerative lesion medially along the olecranon on image 4 of series 7 and image 13 of series 9. There is some supracondylar spurring which may be contributing to posterior elbow impingement IMPRESSION: 1. Prominent tricompartmental spurring including fragmented edematous spurring from the olecranon, and some supracondylar spurring along the trochlear groove which along with spurring of the electron on may contribute to posterior elbow impingement. Substantial radiocapitellar spurring and chondral thinning also noted. 2. Small to moderate elbow joint effusion without a definite loose free osteochondral fragment. 3. Abnormal medial positioning of the ulnar nerve in the expected vicinity of the cubital tunnel possibly due to retinacular insufficiency or prior retinacular release (correlate with operative history). There is low-grade edema in the ulnar nerve,  correlate with symptoms of ulnar neuropathy. There is also a small amount of edema and a small fluid collection in the adjacent proximal flexor carpi radialis muscle. 4. Moderate common extensor tendinopathy and mild common flexor tendinopathy. Low-grade thickening of the proximal radial collateral ligament could reflect a mild sprain. Electronically Signed   By: Van Clines M.D.   On: 02/26/2021 11:27    Assessment & Plan:   Problem List Items Addressed This Visit     Abnormal TSH    Check FT4 and TSH      Relevant Orders   Comprehensive metabolic panel   T4, free   TSH   B12 deficiency    Cont w/Vit B12      Essential hypertension - Primary   Relevant Medications   warfarin (COUMADIN) 6 MG tablet   Other Relevant Orders   Comprehensive metabolic panel   History of DVT of lower extremity    Compr socks Cont w/Coumadin      Hyperglycemia   Relevant Orders   Comprehensive metabolic panel   Hemoglobin A1c   Long term (current) use of anticoagulants    Cont w/Coumadin      Obesity, Class III, BMI 40-49.9 (morbid obesity) (HCC)    Wt Readings from Last 3 Encounters:  04/04/21 (!) 308 lb 12.8 oz (140.1 kg)  02/19/21 (!) 311 lb 12.8 oz (141.4 kg)  08/29/20 (!) 312 lb (141.5 kg)        PULMONARY EMBOLISM, HX OF     Cont w/Coumadin      Sinusitis, chronic    New. Rx: Doxy x 10 d      Relevant Medications   doxycycline (VIBRA-TABS) 100 MG tablet      Follow-up: Return in about 4 months (around 08/05/2021) for a follow-up visit.  Walker Kehr, MD

## 2021-04-04 NOTE — Assessment & Plan Note (Signed)
Cont w/Vit B12 

## 2021-04-04 NOTE — Progress Notes (Signed)
Medical screening examination/treatment/procedure(s) were performed by non-physician practitioner and as supervising physician I was immediately available for consultation/collaboration. I agree with above. Jendaya Gossett, MD  

## 2021-04-04 NOTE — Assessment & Plan Note (Signed)
Cont w/Coumadin °

## 2021-04-07 ENCOUNTER — Other Ambulatory Visit: Payer: Self-pay | Admitting: Internal Medicine

## 2021-04-07 DIAGNOSIS — E039 Hypothyroidism, unspecified: Secondary | ICD-10-CM | POA: Insufficient documentation

## 2021-04-07 MED ORDER — LEVOTHYROXINE SODIUM 25 MCG PO TABS: 25.0000 ug | ORAL_TABLET | Freq: Every day | ORAL | 11 refills | Status: DC

## 2021-04-07 NOTE — Assessment & Plan Note (Addendum)
New problem-start levothyroxine 25 mcg daily Will check free T4, TSH in 4-6 weeks

## 2021-04-10 ENCOUNTER — Other Ambulatory Visit: Payer: Self-pay | Admitting: Internal Medicine

## 2021-04-30 ENCOUNTER — Ambulatory Visit: Payer: MEDICARE | Admitting: Internal Medicine

## 2021-05-02 ENCOUNTER — Encounter: Payer: Self-pay | Admitting: Family Medicine

## 2021-05-02 ENCOUNTER — Telehealth (INDEPENDENT_AMBULATORY_CARE_PROVIDER_SITE_OTHER): Payer: MEDICARE | Admitting: Family Medicine

## 2021-05-02 ENCOUNTER — Other Ambulatory Visit: Payer: Self-pay

## 2021-05-02 VITALS — BP 126/69 | HR 88 | Temp 100.0°F | Wt 308.0 lb

## 2021-05-02 DIAGNOSIS — R0981 Nasal congestion: Secondary | ICD-10-CM | POA: Diagnosis not present

## 2021-05-02 DIAGNOSIS — R059 Cough, unspecified: Secondary | ICD-10-CM

## 2021-05-02 MED ORDER — AZITHROMYCIN 250 MG PO TABS: ORAL_TABLET | ORAL | 0 refills | Status: DC

## 2021-05-02 MED ORDER — BENZONATATE 100 MG PO CAPS: ORAL_CAPSULE | ORAL | 0 refills | Status: DC

## 2021-05-02 MED ORDER — ALBUTEROL SULFATE (2.5 MG/3ML) 0.083% IN NEBU: 2.5000 mg | INHALATION_SOLUTION | RESPIRATORY_TRACT | 0 refills | Status: DC | PRN

## 2021-05-02 NOTE — Patient Instructions (Signed)
-I sent the medication(s) we discussed to your pharmacy: Meds ordered this encounter  Medications   azithromycin (ZITHROMAX) 250 MG tablet    Sig: 2 tabs day 1, then one tab daily    Dispense:  6 tablet    Refill:  0   benzonatate (TESSALON PERLES) 100 MG capsule    Sig: 1-2 capsules up to twice daily as needed for cough.    Dispense:  20 capsule    Refill:  0   albuterol (PROVENTIL) (2.5 MG/3ML) 0.083% nebulizer solution    Sig: Take 3 mLs (2.5 mg total) by nebulization every 4 (four) hours as needed for wheezing or shortness of breath. Dx  J45.909    Dispense:  150 mL    Refill:  0    DX: Asthma   Contact your coumadin nurse in the morning and let her know if you decide to take the Azithromycin (antibiotic)  I hope you are feeling better soon!  Seek in person care promptly if your symptoms worsen, new concerns arise or you are not improving with treatment.  It was nice to meet you today. I help [facility] out with telemedicine visits on Tuesdays and Thursdays and am available for visits on those days. If you have any concerns or questions following this visit please schedule a follow up visit with your Primary Care doctor or seek care at a local urgent care clinic to avoid delays in care.    Azithromycin Tablets What is this medication? AZITHROMYCIN (az ith roe MYE sin) treats infections caused by bacteria. It belongs to a group of medications called antibiotics. It will not treat colds, the flu, or infections caused by viruses. This medicine may be used for other purposes; ask your health care provider or pharmacist if you have questions. COMMON BRAND NAME(S): Zithromax, Zithromax Tri-Pak, Zithromax Z-Pak What should I tell my care team before I take this medication? They need to know if you have any of these conditions: History of blood diseases, like leukemia History of irregular heartbeat Kidney disease Liver disease Myasthenia gravis An unusual or allergic reaction to  azithromycin, erythromycin, other macrolide antibiotics, foods, dyes, or preservatives Pregnant or trying to get pregnant Breast-feeding How should I use this medication? Take this medication by mouth with a full glass of water. Follow the directions on the prescription label. The tablets can be taken with food or on an empty stomach. If the medication upsets your stomach, take it with food. Take your medication at regular intervals. Do not take your medication more often than directed. Take all of your medication as directed even if you think you are better. Do not skip doses or stop your medication early. Talk to your care team regarding the use of this medication in children. While this medication may be prescribed for children as young as 6 months for selected conditions, precautions do apply. Overdosage: If you think you have taken too much of this medicine contact a poison control center or emergency room at once. NOTE: This medicine is only for you. Do not share this medicine with others. What if I miss a dose? If you miss a dose, take it as soon as you can. If it is almost time for your next dose, take only that dose. Do not take double or extra doses. What may interact with this medication? Do not take this medication with any of the following: Cisapride Dronedarone Pimozide Thioridazine This medication may also interact with the following: Antacids that contain aluminum or  magnesium Birth control pills Colchicine Cyclosporine Digoxin Ergot alkaloids like dihydroergotamine, ergotamine Nelfinavir Other medications that prolong the QT interval (an abnormal heart rhythm) Phenytoin Warfarin This list may not describe all possible interactions. Give your health care provider a list of all the medicines, herbs, non-prescription drugs, or dietary supplements you use. Also tell them if you smoke, drink alcohol, or use illegal drugs. Some items may interact with your medicine. What should  I watch for while using this medication? Tell your care team if your symptoms do not start to get better or if they get worse. This medication may cause serious skin reactions. They can happen weeks to months after starting the medication. Contact your care team right away if you notice fevers or flu-like symptoms with a rash. The rash may be red or purple and then turn into blisters or peeling of the skin. Or, you might notice a red rash with swelling of the face, lips or lymph nodes in your neck or under your arms. Do not treat diarrhea with over the counter products. Contact your care team if you have diarrhea that lasts more than 2 days or if it is severe and watery. This medication can make you more sensitive to the sun. Keep out of the sun. If you cannot avoid being in the sun, wear protective clothing and use sunscreen. Do not use sun lamps or tanning beds/booths. What side effects may I notice from receiving this medication? Side effects that you should report to your care team as soon as possible: Allergic reactions or angioedema--skin rash, itching, hives, swelling of the face, eyes, lips, tongue, arms, or legs, trouble swallowing or breathing Heart rhythm changes--fast or irregular heartbeat, dizziness, feeling faint or lightheaded, chest pain, trouble breathing Liver injury--right upper belly pain, loss of appetite, nausea, light-colored stool, dark yellow or brown urine, yellowing skin or eyes, unusual weakness or fatigue Rash, fever, and swollen lymph nodes Redness, blistering, peeling, or loosening of the skin, including inside the mouth Severe diarrhea, fever Unusual vaginal discharge, itching, or odor Side effects that usually do not require medical attention (report to your care team if they continue or are bothersome): Diarrhea Nausea Stomach pain Vomiting This list may not describe all possible side effects. Call your doctor for medical advice about side effects. You may report  side effects to FDA at 1-800-FDA-1088. Where should I keep my medication? Keep out of the reach of children and pets. Store at room temperature between 15 and 30 degrees C (59 and 86 degrees F). Throw away any unused medication after the expiration date. NOTE: This sheet is a summary. It may not cover all possible information. If you have questions about this medicine, talk to your doctor, pharmacist, or health care provider.  2022 Elsevier/Gold Standard (2020-06-04 00:00:00)

## 2021-05-02 NOTE — Progress Notes (Signed)
Virtual Visit via Telephone Note  I connected with Robert [name] on 05/02/21 at  4:40 PM EST by telephone and verified that I am speaking with the correct person using two identifiers.   I discussed the limitations, risks, security and privacy concerns of performing an evaluation and management service by telephone and the availability of in person appointments. I also discussed with the patient that there may be a patient responsible charge related to this service. The patient expressed understanding and agreed to proceed.  Location patient: home, [place] Location provider: work or home office Participants present for the call: patient, provider Patient did not have a visit with me in the prior 7 days to address this/these issue(s).   History of Present Illness:  Acute telemedicine visit for cough and sinus issues: -Onset: about 8 or more days ago symptoms worsened; but has chronic congestion with allergies -several folks in the family have been sick  -they have tested for covid and it has negative -Symptoms include:had some diarrhea and body aches, nasal sinus congestion, ears feel full also, cough - cough is bad and has lots of green and yellow sinus mucus worsening and now low grade temp, pressure in R side of sinuses -wants abx, also when talking with wife at her visit he wants refill on his albuterol nebs - reports take just fine despite listed allergies and has used recently but is running out -O2 is 98% -Denies: fever, CP, SOB, vomiting, inability to drink fluids -Pertinent past medical history:see below -Pertinent medication allergies:  Allergies  Allergen Reactions   Cat Hair Extract Itching   Diltiazem Anaphylaxis   Sulfa Antibiotics Hives and Swelling   Amiodarone     Hand swelling   Anoro Ellipta [Umeclidinium-Vilanterol]     Too much mucus   Breo Ellipta [Fluticasone Furoate-Vilanterol]     Too much mucus   Exalgo [Hydromorphone Hcl] Nausea Only    Sick and nauseated    Fentanyl Other (See Comments)    REACTION: too sleepy   Gabapentin    Hydrocodone-Acetaminophen Nausea Only    REACTION: nausea   Lopressor [Metoprolol Tartrate]     Asthmatic issues    Penicillins Hives   Sulfonamide Derivatives Hives and Swelling   Tylenol [Acetaminophen]     Liver issues  -COVID-19 vaccine status: Immunization History  Administered Date(s) Administered   Fluad Quad(high Dose 65+) 03/21/2019   Influenza Split 03/22/2012   Influenza Whole 03/17/2008, 05/15/2009, 02/13/2010   Influenza, High Dose Seasonal PF 03/26/2018   Influenza,inj,Quad PF,6+ Mos 03/14/2013, 05/09/2014, 03/06/2015, 02/18/2016   Influenza-Unspecified 03/04/2017   Pneumococcal Conjugate-13 08/19/2013   Pneumococcal Polysaccharide-23 04/04/2008, 02/05/2018   Td 06/27/2002   Tdap 09/16/2012   Zoster, Live 06/06/2014      Current Outpatient Medications on File Prior to Visit  Medication Sig Dispense Refill   albuterol (PROVENTIL) (2.5 MG/3ML) 0.083% nebulizer solution Take 3 mLs (2.5 mg total) by nebulization every 4 (four) hours as needed for wheezing or shortness of breath. Dx  J45.909 150 mL 3   albuterol (VENTOLIN HFA) 108 (90 Base) MCG/ACT inhaler Inhale 2 puffs into the lungs every 6 (six) hours as needed for wheezing or shortness of breath. 6.7 g 0   Cholecalciferol (VITAMIN D3) 125 MCG (5000 UT) CAPS Take 1 capsule (5,000 Units total) by mouth daily. 100 capsule 3   fish oil-omega-3 fatty acids 1000 MG capsule Take 2 g by mouth every morning.     furosemide (LASIX) 20 MG tablet TAKE 1 TABLET  DAILY 90 tablet 3   glucosamine-chondroitin 500-400 MG tablet Take 1 tablet by mouth every morning.     levothyroxine (SYNTHROID) 25 MCG tablet Take 1 tablet (25 mcg total) by mouth daily. 30 tablet 11   nortriptyline (PAMELOR) 25 MG capsule Take 1 capsule (25 mg total) by mouth at bedtime. 90 capsule 3   oxyCODONE (ROXICODONE) 5 MG immediate release tablet Take 1 tablet (5 mg total) by mouth every  6 (six) hours as needed for severe pain. 20 tablet 0   predniSONE (DELTASONE) 5 MG tablet TAKE 1 TAB DAILY WITH BREAKFAST. CONTINUE YOUR PREVIOUS HOME DOSE 5MG  OF PREDNISONE DAILY AS BEFORE. 90 tablet 1   warfarin (COUMADIN) 3 MG tablet TAKE 1 TABLET DAILY OR TAKE AS DIRECTED BY ANTICOAGULATION CLINIC 90 tablet 1   warfarin (COUMADIN) 6 MG tablet Take 6 mg by mouth. TAKE 1 TWICE A WEEK     No current facility-administered medications on file prior to visit.    Observations/Objective: Patient sounds cheerful and well on the phone. I do not appreciate any SOB. Speech and thought processing are grossly intact. Patient reported vitals:  Assessment and Plan:  Cough, unspecified type  Sinus congestion  -we discussed possible serious and likely etiologies, options for evaluation and workup, limitations of telemedicine visit vs in person visit, treatment, treatment risks and precautions. Pt prefers to treat via telemedicine empirically rather than in person at this moment. Query influenza, VURI, possible developing sinusitis vs other. He feels strongly that he needs a zpack and prefers that over other options. Discussed viral vs bacterial sinusitis and risks with abx. He agrees to try nasal saline, tessalon for cough and wants zpack if needed. Warned of risks with coumadin and he reports has appt in a few days with coumadin clinic and agrees to call his coumadin nurse in the morning to let her know he might take the zpack.  Advised to seek prompt follow up with PCP office/in person care if worsening, new symptoms arise, or if is not improving with treatment.rsdays and advised a follow up visit with PCP or at an [facility] if has further questions or concerns.   Follow Up Instructions:  I did not refer this patient for an OV with me in the next 24 hours for this/these issue(s).  I discussed the assessment and treatment plan with the patient. The patient was provided an opportunity to ask questions and all  were answered. The patient agreed with the plan and demonstrated an understanding of the instructions.   I spent 21 minutes on the date of this visit in the care of this patient. See summary of tasks completed to properly care for this patient in the detailed notes above which also included counseling of above, review of PMH, medications, allergies, evaluation of the patient and ordering and/or  instructing patient on testing and care options.     Lucretia Kern, DO

## 2021-05-07 ENCOUNTER — Ambulatory Visit (INDEPENDENT_AMBULATORY_CARE_PROVIDER_SITE_OTHER): Payer: MEDICARE

## 2021-05-07 ENCOUNTER — Other Ambulatory Visit: Payer: Self-pay

## 2021-05-07 ENCOUNTER — Other Ambulatory Visit (INDEPENDENT_AMBULATORY_CARE_PROVIDER_SITE_OTHER): Payer: MEDICARE

## 2021-05-07 DIAGNOSIS — E039 Hypothyroidism, unspecified: Secondary | ICD-10-CM | POA: Diagnosis not present

## 2021-05-07 DIAGNOSIS — Z7901 Long term (current) use of anticoagulants: Secondary | ICD-10-CM | POA: Diagnosis not present

## 2021-05-07 LAB — TSH: TSH: 6.18 u[IU]/mL — ABNORMAL HIGH (ref 0.35–5.50)

## 2021-05-07 LAB — POCT INR: INR: 1.9 — AB (ref 2.0–3.0)

## 2021-05-07 LAB — T4, FREE: Free T4: 0.99 ng/dL (ref 0.60–1.60)

## 2021-05-07 NOTE — Progress Notes (Addendum)
Increase dose today to 6mg  and then change to take 3 mg daily except take 6 mg on Mondays, Wednesdays and Fridays.  Re-check in 4 weeks  Medical screening examination/treatment/procedure(s) were performed by non-physician practitioner and as supervising physician I was immediately available for consultation/collaboration.  I agree with above. Lew Dawes, MD

## 2021-05-07 NOTE — Patient Instructions (Addendum)
Pre visit review using our clinic review tool, if applicable. No additional management support is needed unless otherwise documented below in the visit note.  Increase dose today to 6mg  and then change to take 3 mg daily except take 6 mg on Mondays, Wednesdays and Fridays.  Re-check in 4 weeks

## 2021-05-08 ENCOUNTER — Other Ambulatory Visit: Payer: Self-pay | Admitting: Internal Medicine

## 2021-05-08 NOTE — Telephone Encounter (Signed)
Called pt he states he was calling concerning his son. [name]). He states the pain clinic that MD referred Joey to decline to see him. He states that he does no the reason why, but he did not want Dr. Camila Li to tell him that they decline to see him. He is under a lot of stress/ ain , and has been down own him self, but they did find another pain clinic that does the Ablasion of the Abdominal nerves that he need. Its in [facility]. Since the pt is driving he ask if I coud call him back in te morning and he will give me the exact name & number.Marland KitchenJohny [name]

## 2021-05-09 NOTE — Telephone Encounter (Signed)
Message concern son was sent under his chart, and MD will place another referral to [facility] in [place].Marland KitchenJohny [name]

## 2021-05-20 ENCOUNTER — Telehealth: Payer: Self-pay | Admitting: *Deleted

## 2021-05-20 DIAGNOSIS — Z7901 Long term (current) use of anticoagulants: Secondary | ICD-10-CM

## 2021-05-20 MED ORDER — PREDNISONE 5 MG PO TABS: 5.0000 mg | ORAL_TABLET | Freq: Every day | ORAL | 1 refills | Status: DC

## 2021-05-20 MED ORDER — LEVOTHYROXINE SODIUM 25 MCG PO TABS: 25.0000 ug | ORAL_TABLET | Freq: Every day | ORAL | 3 refills | Status: DC

## 2021-05-20 NOTE — Telephone Encounter (Signed)
Rec'd fax stating pt is changing pharmacy. Will need new rx for prednisone 5 mg, Levothyroxine 25 mcg, and warfarin..Will send rx for prednisone & levothyroxine. Will forward msg for renewal warfarin to [place], RN in coumadin clinic.Marland KitchenJohny Chess

## 2021-05-21 MED ORDER — WARFARIN SODIUM 3 MG PO TABS: ORAL_TABLET | ORAL | 1 refills | Status: DC

## 2021-05-21 MED ORDER — WARFARIN SODIUM 6 MG PO TABS: ORAL_TABLET | ORAL | 1 refills | Status: DC

## 2021-05-21 NOTE — Telephone Encounter (Signed)
Confirmed with pt that he is using a 3mg  and 6mg  tablet. He reports when he tried to split the 6mg  tablets in the past they just crumbled. Pt did request medication sent to [facility] on [address]. Advised refills would be sent and if any changes before next apt on 12/13, to contact this clinic. Pt appreciative and verbalized understanding.   Pt is compliant with warfarin management. Sent in refills to [facility].

## 2021-06-04 ENCOUNTER — Other Ambulatory Visit: Payer: Self-pay

## 2021-06-04 ENCOUNTER — Ambulatory Visit (INDEPENDENT_AMBULATORY_CARE_PROVIDER_SITE_OTHER): Payer: MEDICARE

## 2021-06-04 DIAGNOSIS — Z7901 Long term (current) use of anticoagulants: Secondary | ICD-10-CM

## 2021-06-04 LAB — POCT INR: INR: 2.8 (ref 2.0–3.0)

## 2021-06-04 NOTE — Progress Notes (Addendum)
Continue to take 3 mg daily except take 6 mg on Mondays, Wednesdays and Fridays.  Re-check in 6 weeks.  Medical screening examination/treatment/procedure(s) were performed by non-physician practitioner and as supervising physician I was immediately available for consultation/collaboration.  I agree with above. Aleksei [name], MD 

## 2021-06-04 NOTE — Patient Instructions (Addendum)
Pre visit review using our clinic review tool, if applicable. No additional management support is needed unless otherwise documented below in the visit note.  Continue to take 3 mg daily except take 6 mg on Mondays, Wednesdays and Fridays.  Re-check in 6 weeks. 

## 2021-06-09 ENCOUNTER — Encounter: Payer: Self-pay | Admitting: Internal Medicine

## 2021-06-11 ENCOUNTER — Other Ambulatory Visit: Payer: Self-pay | Admitting: Internal Medicine

## 2021-06-11 NOTE — Telephone Encounter (Signed)
Patient calling to check status of mychart response   Patient states hives are still present  Patient requesting a call back   Phone [phone]  *see below*

## 2021-06-19 ENCOUNTER — Other Ambulatory Visit: Payer: Self-pay | Admitting: Internal Medicine

## 2021-06-19 DIAGNOSIS — E039 Hypothyroidism, unspecified: Secondary | ICD-10-CM

## 2021-06-25 ENCOUNTER — Other Ambulatory Visit: Payer: Self-pay | Admitting: *Deleted

## 2021-06-25 DIAGNOSIS — Z7901 Long term (current) use of anticoagulants: Secondary | ICD-10-CM

## 2021-06-25 MED ORDER — FUROSEMIDE 20 MG PO TABS: 20.0000 mg | ORAL_TABLET | Freq: Every day | ORAL | 3 refills | Status: DC

## 2021-06-25 MED ORDER — PREDNISONE 5 MG PO TABS: 5.0000 mg | ORAL_TABLET | Freq: Every day | ORAL | 1 refills | Status: DC

## 2021-06-25 MED ORDER — NORTRIPTYLINE HCL 10 MG PO CAPS: ORAL_CAPSULE | ORAL | 3 refills | Status: DC

## 2021-06-25 NOTE — Telephone Encounter (Signed)
Rec'd fax renewal for pt [name]. Forward to [place], RN w/ Coumadin clinic for refills.[facility]...Robert [name]

## 2021-06-26 MED ORDER — WARFARIN SODIUM 3 MG PO TABS: ORAL_TABLET | ORAL | 1 refills | Status: DC

## 2021-06-26 MED ORDER — WARFARIN SODIUM 6 MG PO TABS: ORAL_TABLET | ORAL | 1 refills | Status: DC

## 2021-06-26 NOTE — Telephone Encounter (Signed)
Pt compliant with coumadin management and PCP apts. Sent in refills.

## 2021-07-01 ENCOUNTER — Other Ambulatory Visit: Payer: Self-pay | Admitting: *Deleted

## 2021-07-01 MED ORDER — NORTRIPTYLINE HCL 25 MG PO CAPS: 25.0000 mg | ORAL_CAPSULE | Freq: Every day | ORAL | 3 refills | Status: DC

## 2021-07-16 ENCOUNTER — Ambulatory Visit: Payer: MEDICARE

## 2021-07-16 ENCOUNTER — Other Ambulatory Visit: Payer: Self-pay | Admitting: Internal Medicine

## 2021-07-16 ENCOUNTER — Ambulatory Visit (INDEPENDENT_AMBULATORY_CARE_PROVIDER_SITE_OTHER): Payer: MEDICARE

## 2021-07-16 ENCOUNTER — Other Ambulatory Visit: Payer: Self-pay

## 2021-07-16 DIAGNOSIS — Z7901 Long term (current) use of anticoagulants: Secondary | ICD-10-CM

## 2021-07-16 LAB — POCT INR: INR: 3 (ref 2.0–3.0)

## 2021-07-16 NOTE — Telephone Encounter (Signed)
Pt is compliant with warfarin management and PCP apts. ?Sent in refill.  ?

## 2021-07-16 NOTE — Patient Instructions (Addendum)
Pre visit review using our clinic review tool, if applicable. No additional management support is needed unless otherwise documented below in the visit note.  Continue to take 3 mg daily except take 6 mg on Mondays, Wednesdays and Fridays.  Re-check in 6 weeks. 

## 2021-07-16 NOTE — Progress Notes (Addendum)
Continue to take 3 mg daily except take 6 mg on Mondays, Wednesdays and Fridays.  Re-check in 6 weeks.  Medical screening examination/treatment/procedure(s) were performed by non-physician practitioner and as supervising physician I was immediately available for consultation/collaboration.  I agree with above. Aleksei [name], MD 

## 2021-08-05 DIAGNOSIS — M5451 Vertebrogenic low back pain: Secondary | ICD-10-CM | POA: Diagnosis not present

## 2021-08-06 ENCOUNTER — Other Ambulatory Visit (INDEPENDENT_AMBULATORY_CARE_PROVIDER_SITE_OTHER): Payer: MEDICARE

## 2021-08-06 DIAGNOSIS — E039 Hypothyroidism, unspecified: Secondary | ICD-10-CM

## 2021-08-06 LAB — T3, FREE: T3, Free: 3 pg/mL (ref 2.3–4.2)

## 2021-08-06 LAB — TSH: TSH: 2.97 u[IU]/mL (ref 0.35–5.50)

## 2021-08-06 LAB — T4, FREE: Free T4: 0.8 ng/dL (ref 0.60–1.60)

## 2021-08-07 LAB — COMPREHENSIVE METABOLIC PANEL
ALT: 17 U/L (ref 0–53)
AST: 20 U/L (ref 0–37)
Albumin: 4.4 g/dL (ref 3.5–5.2)
Alkaline Phosphatase: 72 U/L (ref 39–117)
BUN: 12 mg/dL (ref 6–23)
CO2: 29 mEq/L (ref 19–32)
Calcium: 10.1 mg/dL (ref 8.4–10.5)
Chloride: 99 mEq/L (ref 96–112)
Creatinine, Ser: 1.11 mg/dL (ref 0.40–1.50)
GFR: 67.58 mL/min (ref >60.00)
Glucose, Bld: 117 mg/dL — ABNORMAL HIGH (ref 70–99)
Potassium: 4.9 mEq/L (ref 3.5–5.1)
Sodium: 137 mEq/L (ref 135–145)
Total Bilirubin: 0.6 mg/dL (ref 0.2–1.2)
Total Protein: 8.4 g/dL — ABNORMAL HIGH (ref 6.0–8.3)

## 2021-08-08 ENCOUNTER — Other Ambulatory Visit: Payer: Self-pay

## 2021-08-08 ENCOUNTER — Ambulatory Visit (INDEPENDENT_AMBULATORY_CARE_PROVIDER_SITE_OTHER): Payer: MEDICARE | Admitting: Internal Medicine

## 2021-08-08 ENCOUNTER — Encounter: Payer: Self-pay | Admitting: Internal Medicine

## 2021-08-08 DIAGNOSIS — M1712 Unilateral primary osteoarthritis, left knee: Secondary | ICD-10-CM

## 2021-08-08 DIAGNOSIS — M1711 Unilateral primary osteoarthritis, right knee: Secondary | ICD-10-CM

## 2021-08-08 DIAGNOSIS — M170 Bilateral primary osteoarthritis of knee: Secondary | ICD-10-CM

## 2021-08-08 DIAGNOSIS — E538 Deficiency of other specified B group vitamins: Secondary | ICD-10-CM

## 2021-08-08 DIAGNOSIS — M545 Low back pain, unspecified: Secondary | ICD-10-CM | POA: Diagnosis not present

## 2021-08-08 DIAGNOSIS — G8929 Other chronic pain: Secondary | ICD-10-CM

## 2021-08-08 NOTE — Assessment & Plan Note (Signed)
On CONOCB2 Try to loose wt

## 2021-08-08 NOTE — Progress Notes (Signed)
Subjective:  Patient ID: Robert [name], male    DOB: 1951-01-06  Age: 71 y.o. MRN: [id number]  CC: No chief complaint on file.   HPI Robert [name] presents for abn TSH, LBP/OA, allergies, DVT f/u Taking CONOCB2 drops for pain  Outpatient Medications Prior to Visit  Medication Sig Dispense Refill   albuterol (PROVENTIL) (2.5 MG/3ML) 0.083% nebulizer solution Take 3 mLs (2.5 mg total) by nebulization every 4 (four) hours as needed for wheezing or shortness of breath. 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 6.7 g 0   benzonatate (TESSALON PERLES) 100 MG capsule 1-2 capsules up to twice daily as needed for cough. 20 capsule 0   Cholecalciferol (VITAMIN D3) 125 MCG (5000 UT) CAPS Take 1 capsule (5,000 Units total) by mouth daily. 100 capsule 3   fish oil-omega-3 fatty acids 1000 MG capsule Take 2 g by mouth every morning.     furosemide (LASIX) 20 MG tablet Take 1 tablet (20 mg total) by mouth daily. 90 tablet 3   glucosamine-chondroitin 500-400 MG tablet Take 1 tablet by mouth every morning.     levothyroxine (SYNTHROID) 25 MCG tablet Take 1 tablet (25 mcg total) by mouth daily. 90 tablet 3   methocarbamol (ROBAXIN) 500 MG tablet methocarbamol 500 mg tablet  Take 1 tablet 3 times a day by oral route as needed for 10 days.     nortriptyline (PAMELOR) 10 MG capsule TAKE 1 CAPSULE IN THE EVENING 90 capsule 3   nortriptyline (PAMELOR) 25 MG capsule Take 1 capsule (25 mg total) by mouth at bedtime. 90 capsule 3   predniSONE (DELTASONE) 5 MG tablet Take 1 tablet (5 mg total) by mouth daily with breakfast. 90 tablet 1   pregabalin (LYRICA) 75 MG capsule Lyrica 75 mg capsule  Take 1 capsule twice a day by oral route as needed for 14 days.     warfarin (COUMADIN) 3 MG tablet TAKE 1 TABLET DAILY OR TAKE AS DIRECTED BY ANTICOAGULATION CLINIC 90 tablet 1    warfarin (COUMADIN) 6 MG tablet TAKE 1 TABLET BY MOUTH ON MONDAYS, WEDNESDAYS AND FRIDAYS OR AS DIRECTED BY ANTICOAGULATION CLINIC 45 tablet 1   oxyCODONE (ROXICODONE) 5 MG immediate release tablet Take 1 tablet (5 mg total) by mouth every 6 (six) hours as needed for severe pain. 20 tablet 0   No facility-administered medications prior to visit.    ROS: Review of Systems  Constitutional:  Positive for fatigue. Negative for appetite change and unexpected weight change.  HENT:  Negative for congestion, nosebleeds, sneezing, sore throat and trouble swallowing.   Eyes:  Negative for itching and visual disturbance.  Respiratory:  Negative for cough.   Cardiovascular:  Negative for chest pain, palpitations and leg swelling.  Gastrointestinal:  Negative for abdominal distention, blood in stool, diarrhea and nausea.  Genitourinary:  Negative for frequency and hematuria.  Musculoskeletal:  Positive for arthralgias, back pain and gait problem. Negative for joint swelling and neck pain.  Skin:  Negative for rash.  Neurological:  Negative for dizziness, tremors, speech difficulty and weakness.  Psychiatric/Behavioral:  Negative for agitation, dysphoric mood, sleep disturbance and suicidal ideas. The patient is not nervous/anxious.    Objective:  BP (!) 144/86 (BP Location: Left Arm, Patient Position: Sitting, Cuff Size: Large)    Pulse 89    Temp 98.7  F (37.1 C) (Oral)    Ht 6\' 1"  (1.854 m)    Wt (!) 311 lb (141.1 kg)    SpO2 97%    BMI 41.03 kg/m   BP Readings from Last 3 Encounters:  08/08/21 (!) 144/86  05/02/21 126/69  04/04/21 140/82    Wt Readings from Last 3 Encounters:  08/08/21 (!) 311 lb (141.1 kg)  05/02/21 (!) 308 lb (139.7 kg)  04/04/21 (!) 308 lb 12.8 oz (140.1 kg)    Physical Exam Constitutional:      General: He is not in acute distress.    Appearance: He is well-developed. He is obese.     Comments: NAD  Eyes:     Conjunctiva/sclera: Conjunctivae normal.     Pupils:  Pupils are equal, round, and reactive to light.  Neck:     Thyroid: No thyromegaly.     Vascular: No JVD.  Cardiovascular:     Rate and Rhythm: Normal rate and regular rhythm.     Heart sounds: Normal heart sounds. No murmur heard.   No friction rub. No gallop.  Pulmonary:     Effort: Pulmonary effort is normal. No respiratory distress.     Breath sounds: Normal breath sounds. No wheezing or rales.  Chest:     Chest wall: No tenderness.  Abdominal:     General: Bowel sounds are normal. There is no distension.     Palpations: Abdomen is soft. There is no mass.     Tenderness: There is no abdominal tenderness. There is no guarding or rebound.  Musculoskeletal:        General: No tenderness. Normal range of motion.     Cervical back: Normal range of motion.     Right lower leg: Edema present.     Left lower leg: Edema present.  Lymphadenopathy:     Cervical: No cervical adenopathy.  Skin:    General: Skin is warm and dry.     Findings: No rash.  Neurological:     Mental Status: He is alert and oriented to person, place, and time.     Cranial Nerves: No cranial nerve deficit.     Motor: No abnormal muscle tone.     Coordination: Coordination normal.     Gait: Gait abnormal.     Deep Tendon Reflexes: Reflexes are normal and symmetric.  Psychiatric:        Behavior: Behavior normal.        Thought Content: Thought content normal.        Judgment: Judgment normal.   LS w/pain Trace edema   Lab Results  Component Value Date   WBC 11.6 (H) 05/31/2020   HGB 14.0 05/31/2020   HCT 42.2 05/31/2020   PLT 330.0 05/31/2020   GLUCOSE 117 (H) 08/06/2021   CHOL 208 (H) 03/30/2019   TRIG 112 03/23/2020   HDL 34.40 (L) 03/30/2019   LDLDIRECT 137.0 03/10/2017   LDLCALC 141 (H) 03/30/2019   ALT 17 08/06/2021   AST 20 08/06/2021   NA 137 08/06/2021   K 4.9 08/06/2021   CL 99 08/06/2021   CREATININE 1.11 08/06/2021   BUN 12 08/06/2021   CO2 29 08/06/2021   TSH 2.97 08/06/2021    PSA 0.09 (L) 03/30/2019   INR 3.0 07/16/2021   HGBA1C 6.1 04/04/2021    MR ELBOW RIGHT WO CONTRAST  Result Date: 02/26/2021 CLINICAL DATA:  Right elbow pain and limited range of motion, fall last Saturday. EXAM: MRI OF THE RIGHT ELBOW  WITHOUT CONTRAST TECHNIQUE: Multiplanar, multisequence MR imaging of the elbow was performed. No intravenous contrast was administered. COMPARISON:  None. FINDINGS: The patient was unable to fully extend the elbow during imaging. TENDONS Common forearm flexor origin: Mild proximal tendinopathy. Common forearm extensor origin: Moderate proximal tendinopathy as shown on image 11 series 7. Biceps: Unremarkable Triceps: Unremarkable LIGAMENTS Medial stabilizers: Grossly intact Lateral stabilizers: Thickening and low-grade edema proximally in the radial collateral ligament could reflect a mild sprain. The lateral ulnar collateral ligament appears intact. Cartilage: Moderate thinning of articular cartilage along various surfaces including the capitellum, radial head, olecranon, and parts of the femoral trochlea, without a well-defined focal chondral defect. Joint: Small to moderate joint effusion. I do not observe a free osteochondral fragment. Cubital tunnel: Abnormal laxity or prior release of the cubital retinaculum with the ulnar nerve medially deviated from its expected position for example on images 23 through 19 of series 5. Subtle edema in the ulnar nerve with a small amount of edema and a small fluid collection tracking in the proximal flexor carpi ulnaris muscle for example on image 12 series 6. Bones: Considerable spurring of the capitellum, radial head, olecranon, and trochlear groove. There is marrow edema in the prominent anterior spurring along the coronoid process which may be fragmented. Small erosion or degenerative lesion medially along the olecranon on image 4 of series 7 and image 13 of series 9. There is some supracondylar spurring which may be contributing to  posterior elbow impingement IMPRESSION: 1. Prominent tricompartmental spurring including fragmented edematous spurring from the olecranon, and some supracondylar spurring along the trochlear groove which along with spurring of the electron on may contribute to posterior elbow impingement. Substantial radiocapitellar spurring and chondral thinning also noted. 2. Small to moderate elbow joint effusion without a definite loose free osteochondral fragment. 3. Abnormal medial positioning of the ulnar nerve in the expected vicinity of the cubital tunnel possibly due to retinacular insufficiency or prior retinacular release (correlate with operative history). There is low-grade edema in the ulnar nerve, correlate with symptoms of ulnar neuropathy. There is also a small amount of edema and a small fluid collection in the adjacent proximal flexor carpi radialis muscle. 4. Moderate common extensor tendinopathy and mild common flexor tendinopathy. Low-grade thickening of the proximal radial collateral ligament could reflect a mild sprain. Electronically Signed   By: Van Clines M.D.   On: 02/26/2021 11:27    Assessment & Plan:   Problem List Items Addressed This Visit     B12 deficiency    Cont w/Vit B12      Low back pain    Chronic Taking CONOCB2 drops for pain      Relevant Medications   methocarbamol (ROBAXIN) 500 MG tablet   OA (osteoarthritis) of knee    Try to loose wt      Relevant Medications   methocarbamol (ROBAXIN) 500 MG tablet   Obesity, Class III, BMI 40-49.9 (morbid obesity) (HCC)    Try to loose wt      Osteoarthritis of left knee    On CONOCB2 Try to loose wt      Relevant Medications   methocarbamol (ROBAXIN) 500 MG tablet   Osteoarthritis of right knee    On CONOCB2 Try to loose wt      Relevant Medications   methocarbamol (ROBAXIN) 500 MG tablet      No orders of the defined types were placed in this encounter.     Follow-up: Return in about 3 months  (  around 11/05/2021) for a follow-up visit.  Walker Kehr, MD

## 2021-08-08 NOTE — Assessment & Plan Note (Signed)
Cont w/Vit B12 

## 2021-08-08 NOTE — Assessment & Plan Note (Signed)
Chronic Taking CONOCB2 drops for pain

## 2021-08-08 NOTE — Assessment & Plan Note (Signed)
Try to loose wt 

## 2021-08-12 ENCOUNTER — Encounter: Payer: Self-pay | Admitting: Internal Medicine

## 2021-08-12 NOTE — Telephone Encounter (Signed)
Patient wife Rip Harbour calling in  Says patient is still having severe pain in right leg/hip.. pain is so severe patient is unable to lift right leg   Wants a cb from nurse [phone]

## 2021-08-15 DIAGNOSIS — M546 Pain in thoracic spine: Secondary | ICD-10-CM | POA: Diagnosis not present

## 2021-08-15 DIAGNOSIS — M5451 Vertebrogenic low back pain: Secondary | ICD-10-CM | POA: Diagnosis not present

## 2021-08-15 DIAGNOSIS — M5459 Other low back pain: Secondary | ICD-10-CM | POA: Diagnosis not present

## 2021-08-16 ENCOUNTER — Other Ambulatory Visit: Payer: Self-pay | Admitting: Internal Medicine

## 2021-08-24 DIAGNOSIS — M546 Pain in thoracic spine: Secondary | ICD-10-CM | POA: Diagnosis not present

## 2021-08-24 DIAGNOSIS — M5416 Radiculopathy, lumbar region: Secondary | ICD-10-CM | POA: Diagnosis not present

## 2021-08-24 DIAGNOSIS — M5459 Other low back pain: Secondary | ICD-10-CM | POA: Diagnosis not present

## 2021-08-27 ENCOUNTER — Ambulatory Visit (INDEPENDENT_AMBULATORY_CARE_PROVIDER_SITE_OTHER): Payer: MEDICARE

## 2021-08-27 ENCOUNTER — Other Ambulatory Visit: Payer: Self-pay

## 2021-08-27 DIAGNOSIS — Z7901 Long term (current) use of anticoagulants: Secondary | ICD-10-CM

## 2021-08-27 LAB — POCT INR: INR: 2.2 (ref 2.0–3.0)

## 2021-08-27 NOTE — Patient Instructions (Addendum)
Pre visit review using our clinic review tool, if applicable. No additional management support is needed unless otherwise documented below in the visit note.  Continue to take 3 mg daily except take 6 mg on Mondays, Wednesdays and Fridays.  Re-check in 6 weeks. 

## 2021-08-27 NOTE — Progress Notes (Addendum)
Continue to take 3 mg daily except take 6 mg on Mondays, Wednesdays and Fridays.  Re-check in 6 weeks.  Medical screening examination/treatment/procedure(s) were performed by non-physician practitioner and as supervising physician I was immediately available for consultation/collaboration.  I agree with above. Aleksei [name], MD 

## 2021-09-03 DIAGNOSIS — M546 Pain in thoracic spine: Secondary | ICD-10-CM | POA: Diagnosis not present

## 2021-09-03 DIAGNOSIS — M5451 Vertebrogenic low back pain: Secondary | ICD-10-CM | POA: Diagnosis not present

## 2021-10-08 ENCOUNTER — Ambulatory Visit (INDEPENDENT_AMBULATORY_CARE_PROVIDER_SITE_OTHER): Payer: MEDICARE

## 2021-10-08 DIAGNOSIS — Z7901 Long term (current) use of anticoagulants: Secondary | ICD-10-CM

## 2021-10-08 LAB — POCT INR: INR: 2.9 (ref 2.0–3.0)

## 2021-10-08 NOTE — Patient Instructions (Addendum)
Pre visit review using our clinic review tool, if applicable. No additional management support is needed unless otherwise documented below in the visit note.  Continue to take 3 mg daily except take 6 mg on Mondays, Wednesdays and Fridays.  Re-check in 6 weeks. 

## 2021-10-08 NOTE — Progress Notes (Addendum)
Continue to take 3 mg daily except take 6 mg on Mondays, Wednesdays and Fridays.  Re-check in 6 weeks.  Medical screening examination/treatment/procedure(s) were performed by non-physician practitioner and as supervising physician I was immediately available for consultation/collaboration.  I agree with above. Aleksei [name], MD 

## 2021-10-14 ENCOUNTER — Encounter: Payer: Self-pay | Admitting: Internal Medicine

## 2021-10-17 ENCOUNTER — Telehealth (INDEPENDENT_AMBULATORY_CARE_PROVIDER_SITE_OTHER): Payer: MEDICARE | Admitting: Family Medicine

## 2021-10-17 ENCOUNTER — Encounter: Payer: Self-pay | Admitting: Family Medicine

## 2021-10-17 ENCOUNTER — Telehealth: Payer: MEDICARE | Admitting: Family Medicine

## 2021-10-17 DIAGNOSIS — H109 Unspecified conjunctivitis: Secondary | ICD-10-CM | POA: Diagnosis not present

## 2021-10-17 MED ORDER — POLYMYXIN B-TRIMETHOPRIM 10000-0.1 UNIT/ML-% OP SOLN: 1.0000 [drp] | Freq: Four times a day (QID) | OPHTHALMIC | 0 refills | Status: DC

## 2021-10-17 MED ORDER — POLYMYXIN B-TRIMETHOPRIM 10000-0.1 UNIT/ML-% OP SOLN: 1.0000 [drp] | Freq: Four times a day (QID) | OPHTHALMIC | 0 refills | Status: AC

## 2021-10-17 NOTE — Progress Notes (Signed)
? ? ?MyChart Video Visit ? ? ? ?Virtual Visit via Video Note  ? ?This visit type was conducted due to national recommendations for restrictions regarding the COVID-19 Pandemic (e.g. social distancing) in an effort to limit this patient's exposure and mitigate transmission in our community. This patient is at least at moderate risk for complications without adequate follow up. This format is felt to be most appropriate for this patient at this time. Physical exam was limited by quality of the video and audio technology used for the visit. CMA was able to get the patient set up on a video visit. ? ?Patient location: Home. Patient and provider in visit ?Provider location: Office ? ?I discussed the limitations of evaluation and management by telemedicine and the availability of in person appointments. The patient expressed understanding and agreed to proceed. ? ?Visit Date: 10/17/2021 ? ?Today's healthcare provider: Harland Dingwall, NP-C  ? ? ? ?Subjective:  ? ? Patient ID: Robert [name], male    DOB: [birth year], 71 y.o.   MRN: [id number] ? ?Chief Complaint  ?Patient presents with  ? Conjunctivitis  ? ? ?HPI ? ?Complains of bilateral conjunctivitis.  ?Left eye felt like he had something in it 4 days ago and became red, irritated and eyelashes have been matted together in the mornings. No known foreign body.  ?Right eye started having redness, matting and irritated yesterday.  ? ?Reports recent exposure to pink eye with grandchild.  ? ?Denies fever, chills, headache, loss of vision or double vision, N/V/D.  ? ?Reports having blurry vision which takes time to clear when waking up or using Equate drops.  ? ?He has a history of allergies and is taking medication for this. ? ? ? ? ?Past Medical History:  ?Diagnosis Date  ? Anxiety   ? Asthma   ? "no attack since acupuncture in 1990's"  ? Complication of anesthesia   ? SLOW TO WAKE UP / DIFFICULTY RESPONDING 2003, 3 days before could use left arm, "wild/crazy sometimes"  ?  Cyst of left kidney   ? "benign"  ? DVT (deep venous thrombosis) ([place]) 2003  ? right femoral artery "from groin to knee"  ? Edema   ? Right Leg w/ h/o DVT postphlebitic  ? H/O blood transfusion reaction 2004  ? FFP, extreme swelling, could not breath  ? Headache(784.0)   ? MIGRAINES-not for a long time  ? History of pulmonary embolism 2003  ? both lungs  ? Hyperlipidemia   ? Knee pain   ? left  ? LBP (low back pain)   ? Lung nodule   ? "from 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, clear in 2012"  ? Obesity   ? Osteoarthritis   ? Peripheral vascular disease ([place])   ? "poor circulation in  RT leg"  ? PONV (postoperative nausea and vomiting)   ? during surgery in 1990's  ? Spondylolisthesis   ? Warfarin anticoagulation   ? Wheezing   ? when laying on left side  ? ? ?Past Surgical History:  ?Procedure Laterality Date  ? APPENDECTOMY  1993  ? Dr Margot Chimes  ? CHOLECYSTECTOMY  1993  ? FINGER SURGERY  2012  ? RT THUMB RECONSTRUCTION  ? HAND SURGERY Right   ? abcess  ? HEMORROIDECTOMY  1981  ? I & D KNEE WITH POLY EXCHANGE Left 04/04/2013  ? Procedure: IRRIGATION AND DEBRIDEMENT KNEE WITH POLY EXCHANGE AND INSERTION OF CEMENT BEADS;  Surgeon: Gearlean Alf, MD;  Location: [facility];  Service: Orthopedics;  Laterality: Left;  ?  KNEE ARTHROSCOPY  1990  ? RT KNEE  ? KNEE ARTHROSCOPY Right 2003  ? KNEE ARTHROSCOPY  1988  ? L KNEE  ? KNEE ARTHROSCOPY WITH MENISCAL REPAIR Left 10/22/2012  ? Procedure: LEFT KNEE ARTHROSCOPY PARTIAL MEDIAL AND LATERAL MENISCECTOMY AND DEBRIDEMENT, CHONDROPLASTY ;  Surgeon: Johnn Hai, MD;  Location: [facility];  Service: Orthopedics;  Laterality: Left;  ? LUMBAR DISC SURGERY    ? LUMBAR FUSION  2003  ? Dr Maxie Better  ? ORCHIECTOMY  1994  ? R post injury  ? TONSILLECTOMY  1967  ? TOTAL KNEE ARTHROPLASTY Left 03/21/2013  ? Procedure: LEFT TOTAL KNEE ARTHROPLASTY;  Surgeon: Gearlean Alf, MD;  Location: [facility];  Service: Orthopedics;  Laterality: Left;  ? ? ?Family History  ?Problem Relation Age of Onset  ? Heart disease Mother   ?      CABG at age 46  ? Hyperlipidemia Other   ? Hypertension Other   ? Parkinsonism Other   ? Heart disease Sister   ? ? ?Social History  ? ?Socioeconomic History  ? Marital status: Married  ?  Spouse name: Not on file  ? Number of children: 3  ? Years of education: Not on file  ? Highest education level: Not on file  ?Occupational History  ? Occupation: Disabled  ?  Employer: DISABLED  ?Tobacco Use  ? Smoking status: Never  ? Smokeless tobacco: Never  ?Vaping Use  ? Vaping Use: Never used  ?Substance and Sexual Activity  ? Alcohol use: No  ? Drug use: No  ? Sexual activity: Yes  ?Other Topics Concern  ? Not on file  ?Social History Narrative  ? Right handed  ? One story home  ? Drinks caffeine coffee q am  ? ?Social Determinants of Health  ? ?Financial Resource Strain: Not on file  ?Food Insecurity: Not on file  ?Transportation Needs: Not on file  ?Physical Activity: Not on file  ?Stress: Not on file  ?Social Connections: Not on file  ?Intimate Partner Violence: Not on file  ? ? ?Outpatient Medications Prior to Visit  ?Medication Sig Dispense Refill  ? albuterol (PROVENTIL) (2.5 MG/3ML) 0.083% nebulizer solution Take 3 mLs (2.5 mg total) by nebulization every 4 (four) hours as needed for wheezing or shortness of breath. Dx  J45.909 150 mL 0  ? albuterol (VENTOLIN HFA) 108 (90 Base) MCG/ACT inhaler Inhale 2 puffs into the lungs every 6 (six) hours as needed for wheezing or shortness of breath. 6.7 g 0  ? benzonatate (TESSALON PERLES) 100 MG capsule 1-2 capsules up to twice daily as needed for cough. 20 capsule 0  ? Cholecalciferol (VITAMIN D3) 125 MCG (5000 UT) CAPS Take 1 capsule (5,000 Units total) by mouth daily. 100 capsule 3  ? fish oil-omega-3 fatty acids 1000 MG capsule Take 2 g by mouth every morning.    ? furosemide (LASIX) 20 MG tablet Take 1 tablet (20 mg total) by mouth daily. 90 tablet 3  ? glucosamine-chondroitin 500-400 MG tablet Take 1 tablet by mouth every morning.    ? levothyroxine (SYNTHROID) 25  MCG tablet Take 1 tablet (25 mcg total) by mouth daily. 90 tablet 3  ? methocarbamol (ROBAXIN) 500 MG tablet methocarbamol 500 mg tablet ? Take 1 tablet 3 times a day by oral route as needed for 10 days.    ? nortriptyline (PAMELOR) 10 MG capsule TAKE 1 CAPSULE IN THE EVENING 90 capsule 3  ? nortriptyline (PAMELOR) 25 MG capsule Take 1  capsule (25 mg total) by mouth at bedtime. 90 capsule 3  ? predniSONE (DELTASONE) 5 MG tablet Take 1 tablet (5 mg total) by mouth daily with breakfast. 90 tablet 1  ? pregabalin (LYRICA) 75 MG capsule Lyrica 75 mg capsule ? Take 1 capsule twice a day by oral route as needed for 14 days.    ? warfarin (COUMADIN) 3 MG tablet TAKE 1 TABLET DAILY OR TAKE AS DIRECTED BY ANTICOAGULATION CLINIC 90 tablet 1  ? warfarin (COUMADIN) 6 MG tablet TAKE 1 TABLET BY MOUTH ON MONDAYS, WEDNESDAYS AND FRIDAYS OR AS DIRECTED BY ANTICOAGULATION CLINIC 45 tablet 1  ? ?No facility-administered medications prior to visit.  ? ? ?Allergies  ?Allergen Reactions  ? Cat Hair Extract Itching  ? Diltiazem Anaphylaxis  ? Sulfa Antibiotics Hives and Swelling  ? Amiodarone   ?  Hand swelling  ? Anoro Ellipta [Umeclidinium-Vilanterol]   ?  Too much mucus  ? Breo Ellipta [Fluticasone Furoate-Vilanterol]   ?  Too much mucus  ? Exalgo [Hydromorphone Hcl] Nausea Only  ?  Sick and nauseated  ? Fentanyl Other (See Comments)  ?  REACTION: too sleepy  ? Gabapentin   ? Hydrocodone-Acetaminophen Nausea Only  ?  REACTION: nausea  ? Lopressor [Metoprolol Tartrate]   ?  Asthmatic issues   ? Penicillins Hives  ? Sulfonamide Derivatives Hives and Swelling  ? Tylenol [Acetaminophen]   ?  Liver issues  ? ? ?ROS ? ?   ?Objective:  ?  ?Physical Exam ? ?There were no vitals taken for this visit. ?Wt Readings from Last 3 Encounters:  ?08/08/21 (!) 311 lb (141.1 kg)  ?05/02/21 (!) 308 lb (139.7 kg)  ?04/04/21 (!) 308 lb 12.8 oz (140.1 kg)  ? ?Bilateral eye redness, worse on the right. EOMs intact. Vision aligned and grossly intact.  Symmetric facial movements.  ?   ?Assessment & Plan:  ? ?Problem List Items Addressed This Visit   ?None ?Visit Diagnoses   ? ? Conjunctivitis of both eyes, unspecified conjunctivitis type      ? Relevant Medic

## 2021-10-17 NOTE — Progress Notes (Signed)
E-Visit for Pink Eye ? ? ?We are sorry that you are not feeling well.  Here is how we plan to help! ? ?Based on what you have shared with me it looks like you have conjunctivitis.  Conjunctivitis is a common inflammatory or infectious condition of the eye that is often referred to as "pink eye".  In most cases it is contagious (viral or bacterial). However, not all conjunctivitis requires antibiotics (ex. Allergic).  We have made appropriate suggestions for you based upon your presentation. ? ?I have prescribed Polytrim Ophthalmic drops 1-2 drops 4 times a day times 5 days ? ?Pink eye can be highly contagious.  It is typically spread through direct contact with secretions, or contaminated objects or surfaces that one may have touched.  Strict handwashing is suggested with soap and water is urged.  If not available, use alcohol based had sanitizer.  Avoid unnecessary touching of the eye.  If you wear contact lenses, you will need to refrain from wearing them until you see no white discharge from the eye for at least 24 hours after being on medication.  You should see symptom improvement in 1-2 days after starting the medication regimen.  Call us if symptoms are not improved in 1-2 days. ? ?Home Care: ?Wash your hands often! ?Do not wear your contacts until you complete your treatment plan. ?Avoid sharing towels, bed linen, personal items with a person who has pink eye. ?See attention for anyone in your home with similar symptoms. ? ?Get Help Right Away If: ?Your symptoms do not improve. ?You develop blurred or loss of vision. ?Your symptoms worsen (increased discharge, pain or redness) ? ? ?Thank you for choosing an e-visit. ? ?Your e-visit answers were reviewed by a board certified advanced clinical practitioner to complete your personal care plan. Depending upon the condition, your plan could have included both over the counter or prescription medications. ? ?Please review your pharmacy choice. Make sure the  pharmacy is open so you can pick up prescription now. If there is a problem, you may contact your provider through MyChart messaging and have the prescription routed to another pharmacy.  Your safety is important to us. If you have drug allergies check your prescription carefully.  ? ?For the next 24 hours you can use MyChart to ask questions about today's visit, request a non-urgent call back, or ask for a work or school excuse. ?You will get an email in the next two days asking about your experience. I hope that your e-visit has been valuable and will speed your recovery. ? ?I provided 5 minutes of non face-to-face time during this encounter for chart review, medication and order placement, as well as and documentation.  ? ?

## 2021-11-07 ENCOUNTER — Ambulatory Visit: Payer: MEDICARE | Admitting: Internal Medicine

## 2021-11-12 ENCOUNTER — Ambulatory Visit (INDEPENDENT_AMBULATORY_CARE_PROVIDER_SITE_OTHER): Payer: MEDICARE | Admitting: Internal Medicine

## 2021-11-12 ENCOUNTER — Encounter: Payer: Self-pay | Admitting: Internal Medicine

## 2021-11-12 VITALS — BP 140/82 | HR 78 | Temp 97.9°F | Ht 73.0 in | Wt 316.2 lb

## 2021-11-12 DIAGNOSIS — R739 Hyperglycemia, unspecified: Secondary | ICD-10-CM

## 2021-11-12 DIAGNOSIS — W1800XA Striking against unspecified object with subsequent fall, initial encounter: Secondary | ICD-10-CM

## 2021-11-12 DIAGNOSIS — M545 Low back pain, unspecified: Secondary | ICD-10-CM | POA: Diagnosis not present

## 2021-11-12 DIAGNOSIS — J0101 Acute recurrent maxillary sinusitis: Secondary | ICD-10-CM

## 2021-11-12 DIAGNOSIS — J4520 Mild intermittent asthma, uncomplicated: Secondary | ICD-10-CM | POA: Diagnosis not present

## 2021-11-12 DIAGNOSIS — E538 Deficiency of other specified B group vitamins: Secondary | ICD-10-CM | POA: Diagnosis not present

## 2021-11-12 DIAGNOSIS — R600 Localized edema: Secondary | ICD-10-CM | POA: Diagnosis not present

## 2021-11-12 DIAGNOSIS — G8929 Other chronic pain: Secondary | ICD-10-CM

## 2021-11-12 DIAGNOSIS — I10 Essential (primary) hypertension: Secondary | ICD-10-CM

## 2021-11-12 LAB — HEMOGLOBIN A1C: Hgb A1c MFr Bld: 5.8 % (ref 4.6–6.5)

## 2021-11-12 LAB — COMPREHENSIVE METABOLIC PANEL
ALT: 20 U/L (ref 0–53)
AST: 23 U/L (ref 0–37)
Albumin: 4 g/dL (ref 3.5–5.2)
Alkaline Phosphatase: 73 U/L (ref 39–117)
BUN: 20 mg/dL (ref 6–23)
CO2: 30 mEq/L (ref 19–32)
Calcium: 9.5 mg/dL (ref 8.4–10.5)
Chloride: 101 mEq/L (ref 96–112)
Creatinine, Ser: 1.01 mg/dL (ref 0.40–1.50)
GFR: 75.54 mL/min (ref >60.00)
Glucose, Bld: 108 mg/dL — ABNORMAL HIGH (ref 70–99)
Potassium: 4 mEq/L (ref 3.5–5.1)
Sodium: 137 mEq/L (ref 135–145)
Total Bilirubin: 0.7 mg/dL (ref 0.2–1.2)
Total Protein: 8 g/dL (ref 6.0–8.3)

## 2021-11-12 LAB — VITAMIN B12: Vitamin B-12: 935 pg/mL — ABNORMAL HIGH (ref 211–911)

## 2021-11-12 MED ORDER — DOXYCYCLINE HYCLATE 100 MG PO TABS: 100.0000 mg | ORAL_TABLET | Freq: Two times a day (BID) | ORAL | 0 refills | Status: DC

## 2021-11-12 NOTE — Assessment & Plan Note (Signed)
Cont on Losartan °

## 2021-11-12 NOTE — Assessment & Plan Note (Addendum)
Blue-Emu cream was recommended to use 2-3 times a day Using a Light Wave patch 

## 2021-11-12 NOTE — Assessment & Plan Note (Signed)
On B12 

## 2021-11-12 NOTE — Patient Instructions (Signed)
Blue-Emu cream -- use 2-3 times a day ? ?

## 2021-11-12 NOTE — Assessment & Plan Note (Signed)
R>L Compr sock R

## 2021-11-12 NOTE — Progress Notes (Signed)
Subjective:  Patient ID: Robert [name], male    DOB: Jan 25, 1951  Age: 71 y.o. MRN: [id number]  CC: Follow-up   HPI SCOT SHIRAISHI presents for chronic pain - LBP, anticoagulation for DVT, obesity f/u [name] fell on the L >R knee yesterday C/o sinusitis sx's x weeks   Outpatient Medications Prior to Visit  Medication Sig Dispense Refill   albuterol (PROVENTIL) (2.5 MG/3ML) 0.083% nebulizer solution Take 3 mLs (2.5 mg total) by nebulization every 4 (four) hours as needed for wheezing or shortness of breath. 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 6.7 g 0   Cholecalciferol (VITAMIN D3) 125 MCG (5000 UT) CAPS Take 1 capsule (5,000 Units total) by mouth daily. 100 capsule 3   fish oil-omega-3 fatty acids 1000 MG capsule Take 2 g by mouth every morning.     furosemide (LASIX) 20 MG tablet Take 1 tablet (20 mg total) by mouth daily. 90 tablet 3   glucosamine-chondroitin 500-400 MG tablet Take 1 tablet by mouth every morning.     levothyroxine (SYNTHROID) 25 MCG tablet Take 1 tablet (25 mcg total) by mouth daily. 90 tablet 3   methocarbamol (ROBAXIN) 500 MG tablet methocarbamol 500 mg tablet  Take 1 tablet 3 times a day by oral route as needed for 10 days.     nortriptyline (PAMELOR) 25 MG capsule Take 1 capsule (25 mg total) by mouth at bedtime. 90 capsule 3   predniSONE (DELTASONE) 5 MG tablet Take 1 tablet (5 mg total) by mouth daily with breakfast. 90 tablet 1   pregabalin (LYRICA) 75 MG capsule Lyrica 75 mg capsule  Take 1 capsule twice a day by oral route as needed for 14 days.     warfarin (COUMADIN) 3 MG tablet TAKE 1 TABLET DAILY OR TAKE AS DIRECTED BY ANTICOAGULATION CLINIC 90 tablet 1   warfarin (COUMADIN) 6 MG tablet TAKE 1 TABLET BY MOUTH ON MONDAYS, WEDNESDAYS AND FRIDAYS OR AS DIRECTED BY ANTICOAGULATION CLINIC 45 tablet 1   benzonatate  (TESSALON PERLES) 100 MG capsule 1-2 capsules up to twice daily as needed for cough. (Patient not taking: Reported on 11/12/2021) 20 capsule 0   nortriptyline (PAMELOR) 10 MG capsule TAKE 1 CAPSULE IN THE EVENING 90 capsule 3   oxyCODONE (OXY IR/ROXICODONE) 5 MG immediate release tablet oxycodone 5 mg tablet  TAKE 1 TABLET BY MOUTH EVERY 6 TO 8 HOURS AS NEEDED FOR 5 DAYS     No facility-administered medications prior to visit.    ROS: Review of Systems  Constitutional:  Positive for unexpected weight change. Negative for appetite change and fatigue.  HENT:  Negative for congestion, nosebleeds, sneezing, sore throat and trouble swallowing.   Eyes:  Negative for itching and visual disturbance.  Respiratory:  Negative for cough.   Cardiovascular:  Positive for leg swelling. Negative for chest pain and palpitations.  Gastrointestinal:  Negative for abdominal distention, blood in stool, diarrhea and nausea.  Genitourinary:  Negative for frequency and hematuria.  Musculoskeletal:  Positive for arthralgias, back pain and gait problem. Negative for joint swelling and neck pain.  Skin:  Negative for rash.  Neurological:  Negative for dizziness, tremors, speech difficulty and weakness.  Psychiatric/Behavioral:  Negative for agitation, dysphoric mood and sleep disturbance. The patient is not nervous/anxious.    Objective:  BP 140/82 (BP Location: Left Arm,  Patient Position: Sitting, Cuff Size: Large)   Pulse 78   Temp 97.9 F (36.6 C) (Oral)   Ht '6\' 1"'$  (1.854 m)   Wt (!) 316 lb 3.2 oz (143.4 kg)   SpO2 97%   BMI 41.72 kg/m   BP Readings from Last 3 Encounters:  11/12/21 140/82  08/08/21 (!) 144/86  05/02/21 126/69    Wt Readings from Last 3 Encounters:  11/12/21 (!) 316 lb 3.2 oz (143.4 kg)  08/08/21 (!) 311 lb (141.1 kg)  05/02/21 (!) 308 lb (139.7 kg)    Physical Exam Constitutional:      General: He is not in acute distress.    Appearance: He is well-developed. He is obese.      Comments: NAD  Eyes:     Conjunctiva/sclera: Conjunctivae normal.     Pupils: Pupils are equal, round, and reactive to light.  Neck:     Thyroid: No thyromegaly.     Vascular: No JVD.  Cardiovascular:     Rate and Rhythm: Normal rate and regular rhythm.     Heart sounds: Normal heart sounds. No murmur heard.   No friction rub. No gallop.  Pulmonary:     Effort: Pulmonary effort is normal. No respiratory distress.     Breath sounds: Normal breath sounds. No wheezing or rales.  Chest:     Chest wall: No tenderness.  Abdominal:     General: Bowel sounds are normal. There is no distension.     Palpations: Abdomen is soft. There is no mass.     Tenderness: There is no abdominal tenderness. There is no guarding or rebound.  Musculoskeletal:        General: Tenderness and signs of injury present. Normal range of motion.     Cervical back: Normal range of motion.     Right lower leg: Edema present.     Left lower leg: Edema present.  Lymphadenopathy:     Cervical: No cervical adenopathy.  Skin:    General: Skin is warm and dry.     Findings: No rash.  Neurological:     Mental Status: He is alert and oriented to person, place, and time.     Cranial Nerves: No cranial nerve deficit.     Motor: No abnormal muscle tone.     Coordination: Coordination normal.     Gait: Gait abnormal.     Deep Tendon Reflexes: Reflexes are normal and symmetric.  Psychiatric:        Behavior: Behavior normal.        Thought Content: Thought content normal.        Judgment: Judgment normal.  Antalgic gait LS w/pain  Lab Results  Component Value Date   WBC 11.6 (H) 05/31/2020   HGB 14.0 05/31/2020   HCT 42.2 05/31/2020   PLT 330.0 05/31/2020   GLUCOSE 117 (H) 08/06/2021   CHOL 208 (H) 03/30/2019   TRIG 112 03/23/2020   HDL 34.40 (L) 03/30/2019   LDLDIRECT 137.0 03/10/2017   LDLCALC 141 (H) 03/30/2019   ALT 17 08/06/2021   AST 20 08/06/2021   NA 137 08/06/2021   K 4.9 08/06/2021   CL 99  08/06/2021   CREATININE 1.11 08/06/2021   BUN 12 08/06/2021   CO2 29 08/06/2021   TSH 2.97 08/06/2021   PSA 0.09 (L) 03/30/2019   INR 2.9 10/08/2021   HGBA1C 6.1 04/04/2021    MR ELBOW RIGHT WO CONTRAST  Result Date: 02/26/2021 CLINICAL DATA:  Right elbow pain  and limited range of motion, fall last Saturday. EXAM: MRI OF THE RIGHT ELBOW WITHOUT CONTRAST TECHNIQUE: Multiplanar, multisequence MR imaging of the elbow was performed. No intravenous contrast was administered. COMPARISON:  None. FINDINGS: The patient was unable to fully extend the elbow during imaging. TENDONS Common forearm flexor origin: Mild proximal tendinopathy. Common forearm extensor origin: Moderate proximal tendinopathy as shown on image 11 series 7. Biceps: Unremarkable Triceps: Unremarkable LIGAMENTS Medial stabilizers: Grossly intact Lateral stabilizers: Thickening and low-grade edema proximally in the radial collateral ligament could reflect a mild sprain. The lateral ulnar collateral ligament appears intact. Cartilage: Moderate thinning of articular cartilage along various surfaces including the capitellum, radial head, olecranon, and parts of the femoral trochlea, without a well-defined focal chondral defect. Joint: Small to moderate joint effusion. I do not observe a free osteochondral fragment. Cubital tunnel: Abnormal laxity or prior release of the cubital retinaculum with the ulnar nerve medially deviated from its expected position for example on images 23 through 19 of series 5. Subtle edema in the ulnar nerve with a small amount of edema and a small fluid collection tracking in the proximal flexor carpi ulnaris muscle for example on image 12 series 6. Bones: Considerable spurring of the capitellum, radial head, olecranon, and trochlear groove. There is marrow edema in the prominent anterior spurring along the coronoid process which may be fragmented. Small erosion or degenerative lesion medially along the olecranon on image  4 of series 7 and image 13 of series 9. There is some supracondylar spurring which may be contributing to posterior elbow impingement IMPRESSION: 1. Prominent tricompartmental spurring including fragmented edematous spurring from the olecranon, and some supracondylar spurring along the trochlear groove which along with spurring of the electron on may contribute to posterior elbow impingement. Substantial radiocapitellar spurring and chondral thinning also noted. 2. Small to moderate elbow joint effusion without a definite loose free osteochondral fragment. 3. Abnormal medial positioning of the ulnar nerve in the expected vicinity of the cubital tunnel possibly due to retinacular insufficiency or prior retinacular release (correlate with operative history). There is low-grade edema in the ulnar nerve, correlate with symptoms of ulnar neuropathy. There is also a small amount of edema and a small fluid collection in the adjacent proximal flexor carpi radialis muscle. 4. Moderate common extensor tendinopathy and mild common flexor tendinopathy. Low-grade thickening of the proximal radial collateral ligament could reflect a mild sprain. Electronically Signed   By: Van Clines M.D.   On: 02/26/2021 11:27    Assessment & Plan:   Problem List Items Addressed This Visit     Hyperglycemia - Primary   Relevant Orders   Comprehensive metabolic panel   Hemoglobin A1c   Asthma    Chronic, seasonal Albuterol MDI PRN Treat sinusitis      Edema    R>L Compr sock R      B12 deficiency    On B12       Relevant Orders   Vitamin B12   Low back pain    Blue-Emu cream was recommended to use 2-3 times a day Using a Light Wave patch        Relevant Medications   oxyCODONE (OXY IR/ROXICODONE) 5 MG immediate release tablet   Acute sinusitis    Start Doxy po       Relevant Medications   doxycycline (VIBRA-TABS) 100 MG tablet   Essential hypertension    Cont on Losartan      Relevant  Orders   Comprehensive metabolic panel  Hemoglobin A1c   Fall against object    L knee contusion - Blue-Emu cream was recommended to use 2-3 times a day           Meds ordered this encounter  Medications   doxycycline (VIBRA-TABS) 100 MG tablet    Sig: Take 1 tablet (100 mg total) by mouth 2 (two) times daily.    Dispense:  28 tablet    Refill:  0      Follow-up: Return in about 4 months (around 03/15/2022) for a follow-up visit.  Walker Kehr, MD

## 2021-11-12 NOTE — Assessment & Plan Note (Signed)
L knee contusion - Blue-Emu cream was recommended to use 2-3 times a day

## 2021-11-12 NOTE — Assessment & Plan Note (Addendum)
Chronic, seasonal Albuterol MDI PRN Treat sinusitis

## 2021-11-12 NOTE — Assessment & Plan Note (Signed)
Start Doxy po

## 2021-11-26 ENCOUNTER — Ambulatory Visit (INDEPENDENT_AMBULATORY_CARE_PROVIDER_SITE_OTHER): Payer: MEDICARE

## 2021-11-26 DIAGNOSIS — Z7901 Long term (current) use of anticoagulants: Secondary | ICD-10-CM

## 2021-11-26 LAB — POCT INR: INR: 2.2 (ref 2.0–3.0)

## 2021-11-26 NOTE — Patient Instructions (Addendum)
Pre visit review using our clinic review tool, if applicable. No additional management support is needed unless otherwise documented below in the visit note.  Continue to take 3 mg daily except take 6 mg on Mondays, Wednesdays and Fridays.  Re-check in 6 weeks. 

## 2021-11-26 NOTE — Progress Notes (Cosign Needed Addendum)
Continue to take 3 mg daily except take 6 mg on Mondays, Wednesdays and Fridays.  Re-check in 6 weeks.  Medical screening examination/treatment/procedure(s) were performed by non-physician practitioner and as supervising physician I was immediately available for consultation/collaboration.  I agree with above. Aleksei [name], MD 

## 2021-11-29 ENCOUNTER — Other Ambulatory Visit: Payer: Self-pay | Admitting: Internal Medicine

## 2021-11-29 ENCOUNTER — Encounter: Payer: Self-pay | Admitting: Internal Medicine

## 2021-12-02 ENCOUNTER — Other Ambulatory Visit: Payer: Self-pay | Admitting: Internal Medicine

## 2021-12-02 DIAGNOSIS — Z7901 Long term (current) use of anticoagulants: Secondary | ICD-10-CM

## 2021-12-02 NOTE — Telephone Encounter (Signed)
Pt is compliant with warfarin management and PCP apts. ?Sent in refill.  ?

## 2021-12-05 ENCOUNTER — Telehealth: Payer: Self-pay

## 2021-12-05 NOTE — Telephone Encounter (Signed)
Pt reports he missed dose yesterday and the day before yesterday, a 3 mg dose and a 6 mg dose.  Advised to take 6 mg today and take 9 mg tomorrow and then continue current dosing. Educated pt on signs and symptoms of DVT/PE and stroke. Advised if he has any of these signs and symptoms to contact the office or go to the ER. Pt verbalized understanding.

## 2021-12-06 DIAGNOSIS — H35351 Cystoid macular degeneration, right eye: Secondary | ICD-10-CM | POA: Diagnosis not present

## 2021-12-09 ENCOUNTER — Encounter: Payer: Self-pay | Admitting: Internal Medicine

## 2021-12-16 ENCOUNTER — Encounter: Payer: Self-pay | Admitting: Internal Medicine

## 2021-12-20 DIAGNOSIS — H34831 Tributary (branch) retinal vein occlusion, right eye, with macular edema: Secondary | ICD-10-CM | POA: Diagnosis not present

## 2022-01-07 ENCOUNTER — Ambulatory Visit (INDEPENDENT_AMBULATORY_CARE_PROVIDER_SITE_OTHER): Payer: MEDICARE

## 2022-01-07 DIAGNOSIS — Z7901 Long term (current) use of anticoagulants: Secondary | ICD-10-CM | POA: Diagnosis not present

## 2022-01-07 LAB — POCT INR: INR: 2.6 (ref 2.0–3.0)

## 2022-01-07 NOTE — Progress Notes (Addendum)
Continue to take 3 mg daily except take 6 mg on Mondays, Wednesdays and Fridays.  Re-check in 6 weeks.  Medical screening examination/treatment/procedure(s) were performed by non-physician practitioner and as supervising physician I was immediately available for consultation/collaboration.  I agree with above. Lew Dawes, MD

## 2022-01-07 NOTE — Patient Instructions (Addendum)
Pre visit review using our clinic review tool, if applicable. No additional management support is needed unless otherwise documented below in the visit note.  Continue to take 3 mg daily except take 6 mg on Mondays, Wednesdays and Fridays.  Re-check in 6 weeks.

## 2022-01-20 ENCOUNTER — Ambulatory Visit (INDEPENDENT_AMBULATORY_CARE_PROVIDER_SITE_OTHER): Payer: MEDICARE

## 2022-01-20 DIAGNOSIS — Z0000 Encounter for general adult medical examination without abnormal findings: Secondary | ICD-10-CM | POA: Diagnosis not present

## 2022-01-20 NOTE — Progress Notes (Addendum)
I connected with Robert [name] today by telephone and verified that I am speaking with the correct person using two identifiers. Location patient: home Location provider: work Persons participating in the virtual visit: patient, provider.   I discussed the limitations, risks, security and privacy concerns of performing an evaluation and management service by telephone and the availability of in person appointments. I also discussed with the patient that there may be a patient responsible charge related to this service. The patient expressed understanding and verbally consented to this telephonic visit.    Interactive audio and video telecommunications were attempted between this provider and patient, however failed, due to patient having technical difficulties OR patient did not have access to video capability.  We continued and completed visit with audio only.  Some vital signs may be absent or patient reported.   Time Spent with patient on telephone encounter: 30 minutes  Subjective:   Robert [name] is a 71 y.o. male who presents for Medicare Annual/Subsequent preventive examination.  Review of Systems     Cardiac Risk Factors include: advanced age (>83mn, >>17women);family history of premature cardiovascular disease;dyslipidemia;hypertension;male gender;obesity (BMI >30kg/m2)     Objective:    There were no vitals filed for this visit. There is no height or weight on file to calculate BMI.     01/20/2022    3:41 PM 05/28/2020    2:16 PM 04/10/2020   10:02 AM 03/28/2020    8:00 PM 03/23/2020    8:41 AM 03/18/2020    9:19 AM 03/17/2020   11:01 PM  Advanced Directives  Does Patient Have a Medical Advance Directive? Yes Yes Yes Yes No No No  Type of Advance Directive Out of facility DNR (pink MOST or yellow form) Healthcare Power of [place] will     Does patient want to make changes to medical  advance directive? No - Patient declined  No - Patient declined No - Patient declined     Copy of [place] Chart?  No - copy requested  Yes - validated most recent copy scanned in chart (See row information)     Would patient like information on creating a medical advance directive?  No - Patient declined No - Patient declined  No - Guardian declined No - Patient declined No - Patient declined  Pre-existing out of facility DNR order (yellow form or pink MOST form) Pink MOST/Yellow Form most recent copy in chart - Physician notified to receive inpatient order          Current Medications (verified) Outpatient Encounter Medications as of 01/20/2022  Medication Sig   albuterol (PROVENTIL) (2.5 MG/3ML) 0.083% nebulizer solution Take 3 mLs (2.5 mg total) by nebulization every 4 (four) hours as needed for wheezing or shortness of breath. Dx  J45.909   albuterol (VENTOLIN HFA) 108 (90 Base) MCG/ACT inhaler Inhale 2 puffs into the lungs every 6 (six) hours as needed for wheezing or shortness of breath.   Cholecalciferol (VITAMIN D3) 125 MCG (5000 UT) CAPS Take 1 capsule (5,000 Units total) by mouth daily.   fish oil-omega-3 fatty acids 1000 MG capsule Take 2 g by mouth every morning.   furosemide (LASIX) 20 MG tablet Take 1 tablet (20 mg total) by mouth daily.   glucosamine-chondroitin 500-400 MG tablet Take 1 tablet by mouth every morning.   methocarbamol (ROBAXIN) 500 MG tablet methocarbamol 500 mg tablet  Take 1 tablet 3 times a day  by oral route as needed for 10 days.   nortriptyline (PAMELOR) 25 MG capsule Take 1 capsule (25 mg total) by mouth at bedtime.   oxyCODONE (OXY IR/ROXICODONE) 5 MG immediate release tablet oxycodone 5 mg tablet  TAKE 1 TABLET BY MOUTH EVERY 6 TO 8 HOURS AS NEEDED FOR 5 DAYS   predniSONE (DELTASONE) 5 MG tablet TAKE 1 TABLET DAILY WITH BREAKFAST   warfarin (COUMADIN) 3 MG tablet TAKE 1 TABLET DAILY OR TAKE AS DIRECTED BY ANTICOAGULATION CLINIC    warfarin (COUMADIN) 6 MG tablet TAKE 1 TABLET ON MONDAY, WEDNESDAY AND FRIDAY OR AS DIRECTED BY ANTICOAGULATION CLINIC   [DISCONTINUED] benzonatate (TESSALON PERLES) 100 MG capsule 1-2 capsules up to twice daily as needed for cough. (Patient not taking: Reported on 11/12/2021)   [DISCONTINUED] doxycycline (VIBRA-TABS) 100 MG tablet Take 1 tablet (100 mg total) by mouth 2 (two) times daily.   [DISCONTINUED] levothyroxine (SYNTHROID) 25 MCG tablet Take 1 tablet (25 mcg total) by mouth daily.   [DISCONTINUED] nortriptyline (PAMELOR) 10 MG capsule TAKE 1 CAPSULE IN THE EVENING   [DISCONTINUED] pregabalin (LYRICA) 75 MG capsule Lyrica 75 mg capsule  Take 1 capsule twice a day by oral route as needed for 14 days.   No facility-administered encounter medications on file as of 01/20/2022.    Allergies (verified) Cat hair extract, Diltiazem, Sulfa antibiotics, Amiodarone, Anoro ellipta [umeclidinium-vilanterol], Breo ellipta [fluticasone furoate-vilanterol], Exalgo [hydromorphone hcl], Fentanyl, Gabapentin, Hydrocodone-acetaminophen, Lopressor [metoprolol tartrate], Penicillins, Sulfonamide derivatives, and Tylenol [acetaminophen]   History: Past Medical History:  Diagnosis Date   Anxiety    Asthma    "no attack since acupuncture in 2703'J"   Complication of anesthesia    SLOW TO WAKE UP / DIFFICULTY RESPONDING 2003, 3 days before could use left arm, "wild/crazy sometimes"   Cyst of left kidney    "benign"   DVT (deep venous thrombosis) ([place]) 2003   right femoral artery "from groin to knee"   Edema    Right Leg w/ h/o DVT postphlebitic   H/O blood transfusion reaction 2004   FFP, extreme swelling, could not breath   Headache(784.0)    MIGRAINES-not for a long time   History of pulmonary embolism 2003   both lungs   Hyperlipidemia    Knee pain    left   LBP (low back pain)    [name] 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, clear in 2012"   Obesity    Osteoarthritis    Peripheral vascular  disease ([place])    "poor circulation in  RT leg"   PONV (postoperative nausea and vomiting)    during surgery in 1990's   Spondylolisthesis    Warfarin anticoagulation    Wheezing    when laying on left side   Past Surgical History:  Procedure Laterality Date   APPENDECTOMY  1993   Dr Margot Chimes   CHOLECYSTECTOMY  1993   FINGER SURGERY  2012   RT [place] Right    abcess   [place] Left 04/04/2013   Procedure: IRRIGATION AND DEBRIDEMENT KNEE WITH POLY EXCHANGE AND INSERTION OF CEMENT BEADS;  Surgeon: Gearlean Alf, MD;  Location: [facility];  Service: Orthopedics;  Laterality: Left;   KNEE ARTHROSCOPY  1990   RT KNEE   KNEE ARTHROSCOPY Right 2003   KNEE ARTHROSCOPY  1988   L KNEE   KNEE ARTHROSCOPY WITH MENISCAL REPAIR Left 10/22/2012   Procedure:  LEFT KNEE ARTHROSCOPY PARTIAL MEDIAL AND LATERAL MENISCECTOMY AND DEBRIDEMENT, CHONDROPLASTY ;  Surgeon: Johnn Hai, MD;  Location: [facility];  Service: Orthopedics;  Laterality: Left;   LUMBAR DISC SURGERY     LUMBAR FUSION  2003   Dr Maxie Better   ORCHIECTOMY  1994   R post injury   [place] Left 03/21/2013   Procedure: LEFT TOTAL KNEE ARTHROPLASTY;  Surgeon: Gearlean Alf, MD;  Location: [facility];  Service: Orthopedics;  Laterality: Left;   Family History  Problem Relation Age of Onset   Heart disease Mother        CABG at age 58   Hyperlipidemia Other    Hypertension Other    Parkinsonism Other    Heart disease Sister    Social History   Socioeconomic History   Marital status: Married    Spouse name: Not on file   Number of children: 3   Years of education: Not on file   Highest education level: Not on file  Occupational History   Occupation: Disabled    Employer: DISABLED  Tobacco Use   Smoking status: Never   Smokeless tobacco: Never  Vaping Use   Vaping Use: Never used  Substance and Sexual Activity   Alcohol  use: No   Drug use: No   Sexual activity: Yes  Other Topics Concern   Not on file  Social History Narrative   Right handed   One story home   Drinks caffeine coffee q am   Social Determinants of Health   Financial Resource Strain: Low Risk  (01/20/2022)   Overall Financial Resource Strain (CARDIA)    Difficulty of Paying Living Expenses: Not hard at all  Food Insecurity: No Food Insecurity (01/20/2022)   Hunger Vital Sign    Worried About Running Out of Food in the Last Year: Never true    [place] in the Last Year: Never true  Transportation Needs: No Transportation Needs (01/20/2022)   PRAPARE - Hydrologist (Medical): No    Lack of Transportation (Non-Medical): No  Physical Activity: Inactive (01/20/2022)   Exercise Vital Sign    Days of Exercise per Week: 0 days    Minutes of Exercise per Session: 0 min  Stress: No Stress Concern Present (01/20/2022)   [place]    Feeling of Stress : Not at all  Social Connections: Not on file    Tobacco Counseling Counseling given: Not Answered   Clinical Intake:  Pre-visit preparation completed: Yes  Pain : No/denies pain     BMI - recorded: 41.73 Nutritional Status: BMI > 30  Obese Nutritional Risks: None Diabetes: No  How often do you need to have someone help you when you read instructions, pamphlets, or other written materials from your doctor or pharmacy?: 1 - Never What is the last grade level you completed in school?: [employer]; Bachelor of Science; 30 hours of graduate school  Diabetic? no  Interpreter Needed?: No  Information entered by :: Lisette Abu, LPN.   Activities of Daily Living    01/20/2022    3:49 PM  In your present state of health, do you have any difficulty performing the following activities:  Hearing? 0  Vision? 0  Difficulty concentrating or making decisions? 0  Walking or climbing stairs? 0   Dressing or bathing? 0  Doing errands, shopping? 0  Preparing Food and  eating ? N  Using the Toilet? N  In the past six months, have you accidently leaked urine? N  Do you have problems with loss of bowel control? N  Managing your Medications? N  Managing your Finances? N  Housekeeping or managing your Housekeeping? N    Patient Care Team: [name], [name], MD as PCP - General Buford Dresser, MD as PCP - Cardiology (Cardiology) Rigoberto Noel, MD (Pulmonary Disease) Susa Day, MD (Orthopedic Surgery) Gaynelle Arabian, MD as Attending Physician (Orthopedic Surgery) Josue Hector, MD as Consulting Physician (Cardiology) Thompson Grayer, MD as Consulting Physician (Cardiology) Pieter Partridge, DO as Consulting Physician (Neurology) Lynnell Dike, OD as Consulting Physician (Optometry) Stacie Glaze, Jacqualyn Posey, MD as Consulting Physician (Ophthalmology)  Indicate any recent Medical Services you may have received from other than [facility] providers in the past year (date may be approximate).     Assessment:   This is a routine wellness examination for [facility].  Hearing/Vision screen Hearing Screening - Comments:: Patient denied any hearing difficulty.   No hearing aids.  Vision Screening - Comments:: Patient does wear corrective lenses/contacts.  Eye exam done by: Dennie Fetters, MD.   Dietary issues and exercise activities discussed: Current Exercise Habits: The patient does not participate in regular exercise at present, Exercise limited by: orthopedic condition(s);respiratory conditions(s);cardiac condition(s)   Goals Addressed   None   Depression Screen    01/20/2022    3:47 PM 08/08/2021    8:27 AM 05/31/2020   11:00 AM 05/28/2020    2:20 PM 04/10/2020   10:05 AM 02/05/2018    8:19 AM  PHQ 2/9 Scores  PHQ - 2 Score 0 0 0 0 1 0    Fall Risk    01/20/2022    3:49 PM 08/08/2021    8:27 AM 05/31/2020   11:00 AM 05/28/2020    2:17 PM 08/29/2019    1:57 PM  [place] in the past year? 0 0 1 0 0  Number falls in past yr: 0 0 0 0 0  Injury with Fall? 0 0 0 0 0  Risk for fall due to : No Fall Risks No Fall Risks  No Fall Risks   Follow up Falls evaluation completed Falls evaluation completed  Falls evaluation completed     [place]:  Any stairs in or around the home? No  If so, are there any without handrails? No  Home free of loose throw rugs in walkways, pet beds, electrical cords, etc? Yes  Adequate lighting in your home to reduce risk of falls? Yes   ASSISTIVE DEVICES UTILIZED TO PREVENT FALLS:  Life alert? No  Use of a cane, walker or w/c? Yes  Grab bars in the bathroom? No  Shower chair or bench in shower? No  Elevated toilet seat or a handicapped toilet? No   TIMED UP AND GO:  Was the test performed? No .  Length of time to ambulate 10 feet: n/a sec.   Appearance of gait: Gait not evaluated during this visit.  Cognitive Function:        01/20/2022    3:49 PM  6CIT Screen  What Year? 0 points  What month? 0 points  What time? 0 points  Count back from 20 0 points  Months in reverse 0 points  Repeat phrase 0 points  Total Score 0 points    Immunizations Immunization History  Administered Date(s) Administered  Fluad Quad(high Dose 65+) 03/21/2019   Influenza Split 03/22/2012   Influenza Whole 03/17/2008, 05/15/2009, 02/13/2010   Influenza, High Dose Seasonal PF 03/26/2018   Influenza,inj,Quad PF,6+ Mos 03/14/2013, 05/09/2014, 03/06/2015, 02/18/2016   Influenza-Unspecified 03/04/2017   Pneumococcal Conjugate-13 08/19/2013   Pneumococcal Polysaccharide-23 04/04/2008, 02/05/2018   Td 06/27/2002   Tdap 09/16/2012   Zoster, Live 06/06/2014    TDAP status: Up to date  Flu Vaccine status: Due, Education has been provided regarding the importance of this vaccine. Advised may receive this vaccine at local pharmacy or Health Dept. Aware to provide a copy of the vaccination record if  obtained from local pharmacy or Health Dept. Verbalized acceptance and understanding.  Pneumococcal vaccine status: Up to date  Covid-19 vaccine status: Declined, Education has been provided regarding the importance of this vaccine but patient still declined. Advised may receive this vaccine at local pharmacy or Health Dept.or vaccine clinic. Aware to provide a copy of the vaccination record if obtained from local pharmacy or Health Dept. Verbalized acceptance and understanding.  Qualifies for Shingles Vaccine? Yes   Zostavax completed Yes   Shingrix Completed?: No.    Education has been provided regarding the importance of this vaccine. Patient has been advised to call insurance company to determine out of pocket expense if they have not yet received this vaccine. Advised may also receive vaccine at local pharmacy or Health Dept. Verbalized acceptance and understanding.  Screening Tests Health Maintenance  Topic Date Due   Zoster Vaccines- Shingrix (1 of 2) Never done   COLONOSCOPY (Pts 45-75yr Insurance coverage will need to be confirmed)  Never done   INFLUENZA VACCINE  01/21/2022   TETANUS/TDAP  09/17/2022   Pneumonia Vaccine 70 Years old  Completed   Hepatitis C Screening  Completed   HPV VACCINES  Aged Out   COVID-19 Vaccine  Discontinued    Health Maintenance  Health Maintenance Due  Topic Date Due   Zoster Vaccines- Shingrix (1 of 2) Never done   COLONOSCOPY (Pts 45-423yrInsurance coverage will need to be confirmed)  Never done    Colorectal cancer screening: No longer required.   [name] Cancer Screening: (Low Dose CT Chest recommended if Age 70-80ears, 30 pack-year currently smoking OR have quit w/in 15years.) does not qualify.   [name] Cancer Screening Referral: no  Additional Screening:  Hepatitis C Screening: does qualify; Completed 02/18/2016  Vision Screening: Recommended annual ophthalmology exams for early detection of glaucoma and other disorders of the  eye. Is the patient up to date with their annual eye exam?  Yes  Who is the provider or what is the name of the office in which the patient attends annual eye exams? [facility]. If pt is not established with a provider, would they like to be referred to a provider to establish care? No .   Dental Screening: Recommended annual dental exams for proper oral hygiene  Community Resource Referral / Chronic Care Management: CRR required this visit?  No   CCM required this visit?  No      Plan:     I have personally reviewed and noted the following in the patient's chart:   Medical and social history Use of alcohol, tobacco or illicit drugs  Current medications and supplements including opioid prescriptions. Patient is not currently taking opioid prescriptions. Functional ability and status Nutritional status Physical activity Advanced directives List of other physicians Hospitalizations, surgeries, and ER visits in previous 12 months Vitals Screenings to include cognitive, depression,  and falls Referrals and appointments  In addition, I have reviewed and discussed with patient certain preventive protocols, quality metrics, and best practice recommendations. A written personalized care plan for preventive services as well as general preventive health recommendations were provided to patient.     Sheral Flow, LPN   [id number]   Nurse Notes:  Patient is cogitatively intact. There were no vitals filed for this visit. There is no height or weight on file to calculate BMI.  Medical screening examination/treatment/procedure(s) were performed by non-physician practitioner and as supervising physician I was immediately available for consultation/collaboration.  I agree with above. Lew Dawes, MD

## 2022-01-20 NOTE — Patient Instructions (Signed)
Robert [name] , Thank you for taking time to come for your Medicare Wellness Visit. I appreciate your ongoing commitment to your health goals. Please review the following plan we discussed and let me know if I can assist you in the future.   Screening recommendations/referrals: Colonoscopy: Declined Recommended yearly ophthalmology/optometry visit for glaucoma screening and checkup Recommended yearly dental visit for hygiene and checkup  Vaccinations: Influenza vaccine: declined Pneumococcal vaccine: 02/05/2018, 08/19/2013 Tdap vaccine: 09/16/2012; due every 10 years Shingles vaccine: declined   Covid-19: declined  Advanced directives: DNR on file  Conditions/risks identified: Yes  Next appointment: Please schedule your next Medicare Wellness Visit with your Nurse Health Advisor in 1 year by calling [phone].  Preventive Care 70 Years and Older, Male Preventive care refers to lifestyle choices and visits with your health care provider that can promote health and wellness. What does preventive care include? A yearly physical exam. This is also called an annual well check. Dental exams once or twice a year. Routine eye exams. Ask your health care provider how often you should have your eyes checked. Personal lifestyle choices, including: Daily care of your teeth and gums. Regular physical activity. Eating a healthy diet. Avoiding tobacco and drug use. Limiting alcohol use. Practicing safe sex. Taking low doses of aspirin every day. Taking vitamin and mineral supplements as recommended by your health care provider. What happens during an annual well check? The services and screenings done by your health care provider during your annual well check will depend on your age, overall health, lifestyle risk factors, and family history of disease. Counseling  Your health care provider may ask you questions about your: Alcohol use. Tobacco use. Drug use. Emotional well-being. Home  and relationship well-being. Sexual activity. Eating habits. History of falls. Memory and ability to understand (cognition). Work and work Statistician. Screening  You may have the following tests or measurements: Height, weight, and BMI. Blood pressure. Lipid and cholesterol levels. These may be checked every 5 years, or more frequently if you are over 18 years old. Skin check. Lung cancer screening. You may have this screening every year starting at age 70 if you have a 30-pack-year history of smoking and currently smoke or have quit within the past 15 years. Fecal occult blood test (FOBT) of the stool. You may have this test every year starting at age 70. Flexible sigmoidoscopy or colonoscopy. You may have a sigmoidoscopy every 5 years or a colonoscopy every 10 years starting at age 70. Prostate cancer screening. Recommendations will vary depending on your family history and other risks. Hepatitis C blood test. Hepatitis B blood test. Sexually transmitted disease (STD) testing. Diabetes screening. This is done by checking your blood sugar (glucose) after you have not eaten for a while (fasting). You may have this done every 1-3 years. Abdominal aortic aneurysm (AAA) screening. You may need this if you are a current or former smoker. Osteoporosis. You may be screened starting at age 70 if you are at high risk. Talk with your health care provider about your test results, treatment options, and if necessary, the need for more tests. Vaccines  Your health care provider may recommend certain vaccines, such as: Influenza vaccine. This is recommended every year. Tetanus, diphtheria, and acellular pertussis (Tdap, Td) vaccine. You may need a Td booster every 10 years. Zoster vaccine. You may need this after age 70. Pneumococcal 13-valent conjugate (PCV13) vaccine. One dose is recommended after age 70. Pneumococcal polysaccharide (PPSV23) vaccine. One dose is recommended after  age 20. Talk to  your health care provider about which screenings and vaccines you need and how often you need them. This information is not intended to replace advice given to you by your health care provider. Make sure you discuss any questions you have with your health care provider. Document Released: 07/06/2015 Document Revised: 02/27/2016 Document Reviewed: 04/10/2015 Elsevier Interactive Patient Education  2017 [employer] Prevention in the Home Falls can cause injuries. They can happen to people of all ages. There are many things you can do to make your home safe and to help prevent falls. What can I do on the outside of my home? Regularly fix the edges of walkways and driveways and fix any cracks. Remove anything that might make you trip as you walk through a door, such as a raised step or threshold. Trim any bushes or trees on the path to your home. Use bright outdoor lighting. Clear any walking paths of anything that might make someone trip, such as rocks or tools. Regularly check to see if handrails are loose or broken. Make sure that both sides of any steps have handrails. Any raised decks and porches should have guardrails on the edges. Have any leaves, snow, or ice cleared regularly. Use sand or salt on walking paths during winter. Clean up any spills in your garage right away. This includes oil or grease spills. What can I do in the bathroom? Use night lights. Install grab bars by the toilet and in the tub and shower. Do not use towel bars as grab bars. Use non-skid mats or decals in the tub or shower. If you need to sit down in the shower, use a plastic, non-slip stool. Keep the floor dry. Clean up any water that spills on the floor as soon as it happens. Remove soap buildup in the tub or shower regularly. Attach bath mats securely with double-sided non-slip rug tape. Do not have throw rugs and other things on the floor that can make you trip. What can I do in the bedroom? Use  night lights. Make sure that you have a light by your bed that is easy to reach. Do not use any sheets or blankets that are too big for your bed. They should not hang down onto the floor. Have a firm chair that has side arms. You can use this for support while you get dressed. Do not have throw rugs and other things on the floor that can make you trip. What can I do in the kitchen? Clean up any spills right away. Avoid walking on wet floors. Keep items that you use a lot in easy-to-reach places. If you need to reach something above you, use a strong step stool that has a grab bar. Keep electrical cords out of the way. Do not use floor polish or wax that makes floors slippery. If you must use wax, use non-skid floor wax. Do not have throw rugs and other things on the floor that can make you trip. What can I do with my stairs? Do not leave any items on the stairs. Make sure that there are handrails on both sides of the stairs and use them. Fix handrails that are broken or loose. Make sure that handrails are as long as the stairways. Check any carpeting to make sure that it is firmly attached to the stairs. Fix any carpet that is loose or worn. Avoid having throw rugs at the top or bottom of the stairs. If you do  have throw rugs, attach them to the floor with carpet tape. Make sure that you have a light switch at the top of the stairs and the bottom of the stairs. If you do not have them, ask someone to add them for you. What else can I do to help prevent falls? Wear shoes that: Do not have high heels. Have rubber bottoms. Are comfortable and fit you well. Are closed at the toe. Do not wear sandals. If you use a stepladder: Make sure that it is fully opened. Do not climb a closed stepladder. Make sure that both sides of the stepladder are locked into place. Ask someone to hold it for you, if possible. Clearly mark and make sure that you can see: Any grab bars or handrails. First and last  steps. Where the edge of each step is. Use tools that help you move around (mobility aids) if they are needed. These include: Canes. Walkers. Scooters. Crutches. Turn on the lights when you go into a dark area. Replace any light bulbs as soon as they burn out. Set up your furniture so you have a clear path. Avoid moving your furniture around. If any of your floors are uneven, fix them. If there are any pets around you, be aware of where they are. Review your medicines with your doctor. Some medicines can make you feel dizzy. This can increase your chance of falling. Ask your doctor what other things that you can do to help prevent falls. This information is not intended to replace advice given to you by your health care provider. Make sure you discuss any questions you have with your health care provider. Document Released: 04/05/2009 Document Revised: 11/15/2015 Document Reviewed: 07/14/2014 Elsevier Interactive Patient Education  2017 [employer].

## 2022-01-22 DIAGNOSIS — H34831 Tributary (branch) retinal vein occlusion, right eye, with macular edema: Secondary | ICD-10-CM | POA: Diagnosis not present

## 2022-02-18 ENCOUNTER — Ambulatory Visit (INDEPENDENT_AMBULATORY_CARE_PROVIDER_SITE_OTHER): Payer: MEDICARE

## 2022-02-18 DIAGNOSIS — Z7901 Long term (current) use of anticoagulants: Secondary | ICD-10-CM | POA: Diagnosis not present

## 2022-02-18 LAB — POCT INR: INR: 2.5 (ref 2.0–3.0)

## 2022-02-18 NOTE — Progress Notes (Cosign Needed Addendum)
Continue to take 3 mg daily except take 6 mg on Mondays, Wednesdays and Fridays.  Re-check in 6 weeks.  Medical screening examination/treatment/procedure(s) were performed by non-physician practitioner and as supervising physician I was immediately available for consultation/collaboration.  I agree with above. Lew Dawes, MD

## 2022-02-18 NOTE — Patient Instructions (Addendum)
Pre visit review using our clinic review tool, if applicable. No additional management support is needed unless otherwise documented below in the visit note.  Continue to take 3 mg daily except take 6 mg on Mondays, Wednesdays and Fridays.  Re-check in 6 weeks.

## 2022-02-19 DIAGNOSIS — H34831 Tributary (branch) retinal vein occlusion, right eye, with macular edema: Secondary | ICD-10-CM | POA: Diagnosis not present

## 2022-03-12 IMAGING — DX DG CHEST 1V PORT
1 series · 1 of 1 positions shown · non-contrast
Comparison: 03/31/2013

CLINICAL DATA: Worsening shortness of breath.  COVID positive.

EXAM:
PORTABLE CHEST 1 VIEW

[chest ap]
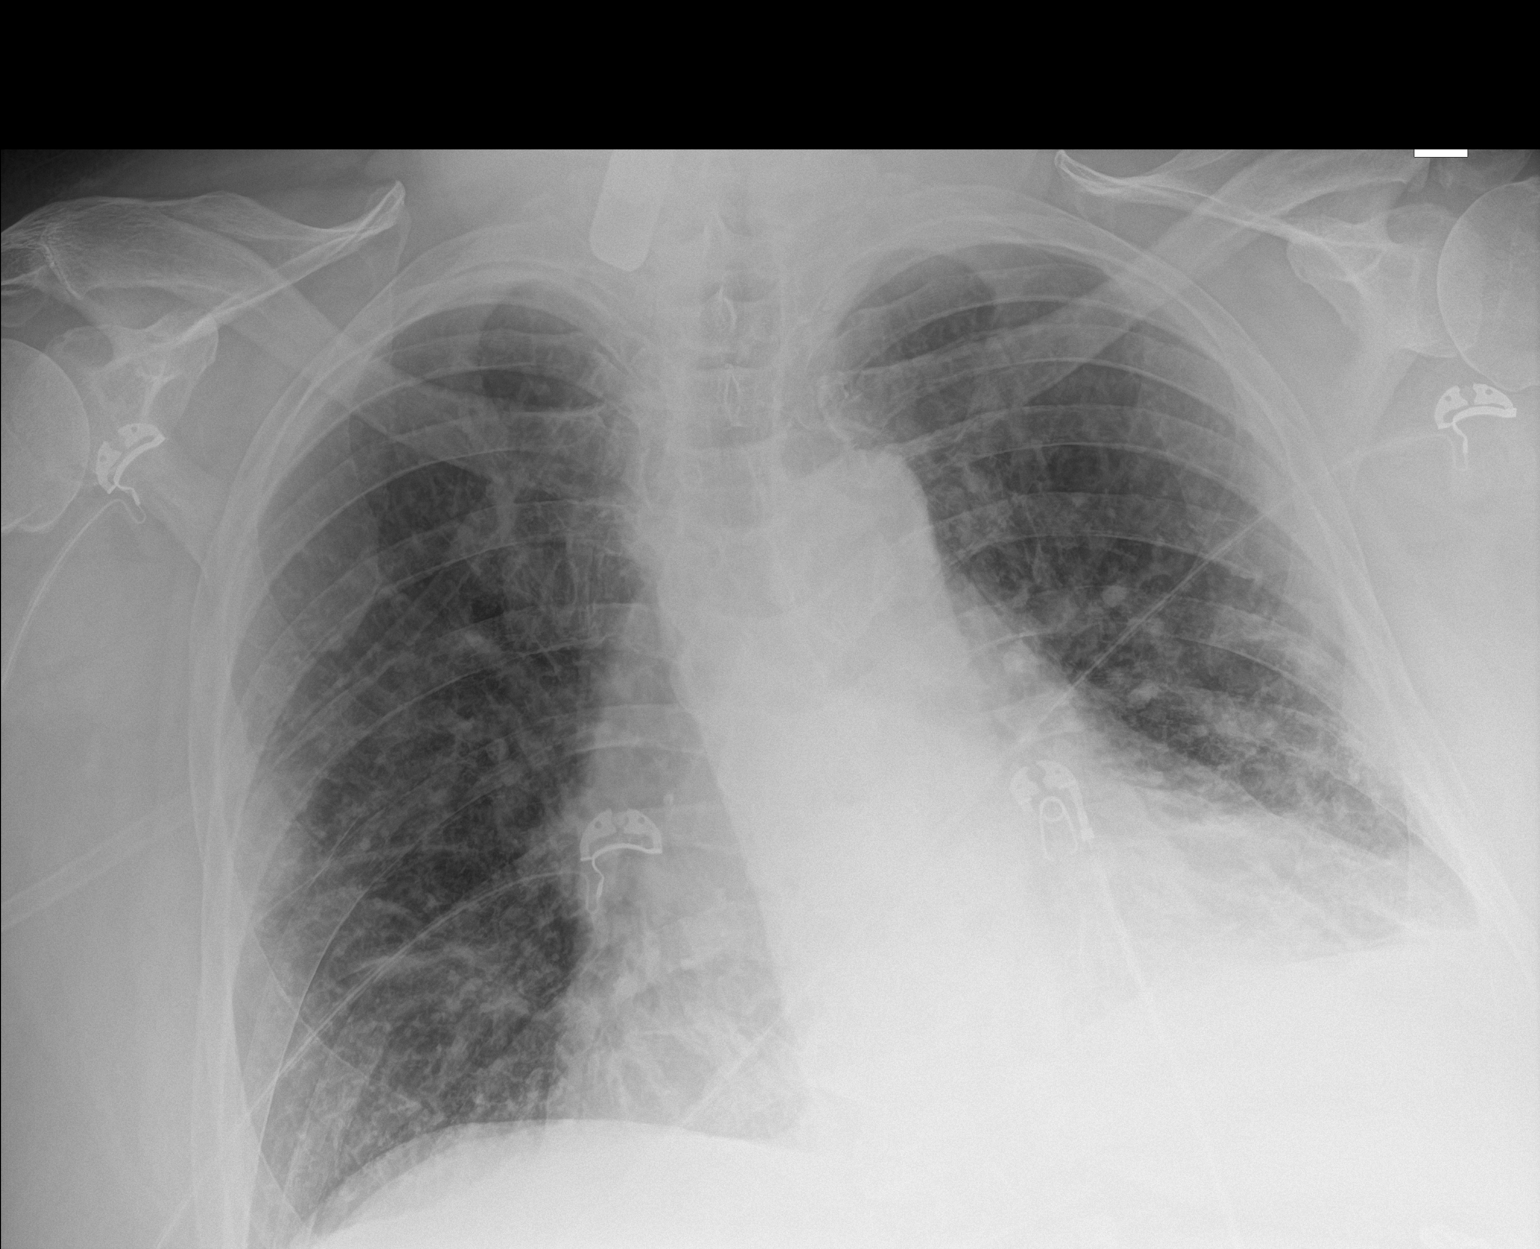

[1 of 1 positions shown; findings below may reference images not displayed]

FINDINGS: Cardiac enlargement. Patchy peripheral and basilar infiltration with
peribronchial thickening compatible with multifocal pneumonia. No
pleural effusions. No pneumothorax. Mediastinal contours appear
intact.
IMPRESSION: Cardiac enlargement. Patchy bilateral pulmonary infiltrates
compatible with multifocal pneumonia.

## 2022-03-17 ENCOUNTER — Encounter: Payer: Self-pay | Admitting: Internal Medicine

## 2022-03-17 ENCOUNTER — Ambulatory Visit (INDEPENDENT_AMBULATORY_CARE_PROVIDER_SITE_OTHER): Payer: MEDICARE | Admitting: Internal Medicine

## 2022-03-17 VITALS — BP 132/84 | HR 79 | Temp 98.2°F | Ht 73.0 in | Wt 310.4 lb

## 2022-03-17 DIAGNOSIS — M545 Low back pain, unspecified: Secondary | ICD-10-CM

## 2022-03-17 DIAGNOSIS — G8929 Other chronic pain: Secondary | ICD-10-CM | POA: Diagnosis not present

## 2022-03-17 DIAGNOSIS — E785 Hyperlipidemia, unspecified: Secondary | ICD-10-CM | POA: Diagnosis not present

## 2022-03-17 DIAGNOSIS — H348112 Central retinal vein occlusion, right eye, stable: Secondary | ICD-10-CM | POA: Diagnosis not present

## 2022-03-17 DIAGNOSIS — J320 Chronic maxillary sinusitis: Secondary | ICD-10-CM

## 2022-03-17 DIAGNOSIS — E538 Deficiency of other specified B group vitamins: Secondary | ICD-10-CM

## 2022-03-17 DIAGNOSIS — G44229 Chronic tension-type headache, not intractable: Secondary | ICD-10-CM

## 2022-03-17 DIAGNOSIS — I10 Essential (primary) hypertension: Secondary | ICD-10-CM

## 2022-03-17 DIAGNOSIS — R519 Headache, unspecified: Secondary | ICD-10-CM | POA: Insufficient documentation

## 2022-03-17 LAB — LIPID PANEL
Cholesterol: 191 mg/dL (ref 0–200)
HDL: 36.2 mg/dL — ABNORMAL LOW (ref >39.00)
LDL Cholesterol: 121 mg/dL — ABNORMAL HIGH (ref 0–99)
NonHDL: 154.34
Total CHOL/HDL Ratio: 5
Triglycerides: 168 mg/dL — ABNORMAL HIGH (ref 0.0–149.0)
VLDL: 33.6 mg/dL (ref 0.0–40.0)

## 2022-03-17 LAB — CBC WITH DIFFERENTIAL/PLATELET
Basophils Absolute: 0.1 10*3/uL (ref 0.0–0.1)
Basophils Relative: 0.9 % (ref 0.0–3.0)
Eosinophils Absolute: 0.6 10*3/uL (ref 0.0–0.7)
Eosinophils Relative: 7 % — ABNORMAL HIGH (ref 0.0–5.0)
HCT: 45.3 % (ref 39.0–52.0)
Hemoglobin: 15.8 g/dL (ref 13.0–17.0)
Lymphocytes Relative: 28.7 % (ref 12.0–46.0)
Lymphs Abs: 2.3 10*3/uL (ref 0.7–4.0)
MCHC: 34.9 g/dL (ref 30.0–36.0)
MCV: 93.9 fl (ref 78.0–100.0)
Monocytes Absolute: 1 10*3/uL (ref 0.1–1.0)
Monocytes Relative: 13 % — ABNORMAL HIGH (ref 3.0–12.0)
Neutro Abs: 4 10*3/uL (ref 1.4–7.7)
Neutrophils Relative %: 50.4 % (ref 43.0–77.0)
Platelets: 234 10*3/uL (ref 150.0–400.0)
RBC: 4.83 Mil/uL (ref 4.22–5.81)
RDW: 13.8 % (ref 11.5–15.5)
WBC: 7.9 10*3/uL (ref 4.0–10.5)

## 2022-03-17 LAB — COMPREHENSIVE METABOLIC PANEL
ALT: 18 U/L (ref 0–53)
AST: 19 U/L (ref 0–37)
Albumin: 3.9 g/dL (ref 3.5–5.2)
Alkaline Phosphatase: 73 U/L (ref 39–117)
BUN: 16 mg/dL (ref 6–23)
CO2: 29 mEq/L (ref 19–32)
Calcium: 9.1 mg/dL (ref 8.4–10.5)
Chloride: 102 mEq/L (ref 96–112)
Creatinine, Ser: 1.04 mg/dL (ref 0.40–1.50)
GFR: 72.76 mL/min (ref >60.00)
Glucose, Bld: 96 mg/dL (ref 70–99)
Potassium: 3.9 mEq/L (ref 3.5–5.1)
Sodium: 140 mEq/L (ref 135–145)
Total Bilirubin: 0.8 mg/dL (ref 0.2–1.2)
Total Protein: 7.6 g/dL (ref 6.0–8.3)

## 2022-03-17 LAB — TSH: TSH: 4.49 u[IU]/mL (ref 0.35–5.50)

## 2022-03-17 LAB — SEDIMENTATION RATE: Sed Rate: 24 mm/hr — ABNORMAL HIGH (ref 0–20)

## 2022-03-17 NOTE — Assessment & Plan Note (Signed)
Chronic Cont w/Vit B12

## 2022-03-17 NOTE — Assessment & Plan Note (Signed)
Chronic LBP

## 2022-03-17 NOTE — Progress Notes (Signed)
Subjective:  Patient ID: Robert [name], male    DOB: 1950/08/20  Age: 71 y.o. MRN: [id number]  CC: Follow-up (4 MONTH F/U)   HPI Robert [name] presents for CAD, OA, anticoagulation for DVT C/o R HA  C/o recent R eye retinal vein occlusion - Dr Stacie Glaze  Outpatient Medications Prior to Visit  Medication Sig Dispense Refill   Aflibercept (EYLEA) 2 MG/0.05ML SOLN by Intravitreal route. 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 6.7 g 0   Cholecalciferol (VITAMIN D3) 125 MCG (5000 UT) CAPS Take 1 capsule (5,000 Units total) by mouth daily. 100 capsule 3   fish oil-omega-3 fatty acids 1000 MG capsule Take 2 g by mouth every morning.     furosemide (LASIX) 20 MG tablet Take 1 tablet (20 mg total) by mouth daily. 90 tablet 3   glucosamine-chondroitin 500-400 MG tablet Take 1 tablet by mouth every morning.     nortriptyline (PAMELOR) 25 MG capsule Take 1 capsule (25 mg total) by mouth at bedtime. 90 capsule 3   oxyCODONE (OXY IR/ROXICODONE) 5 MG immediate release tablet oxycodone 5 mg tablet  TAKE 1 TABLET BY MOUTH EVERY 6 TO 8 HOURS AS NEEDED FOR 5 DAYS     predniSONE (DELTASONE) 5 MG tablet TAKE 1 TABLET DAILY WITH BREAKFAST 90 tablet 1   warfarin (COUMADIN) 3 MG tablet TAKE 1 TABLET DAILY OR TAKE AS DIRECTED BY ANTICOAGULATION CLINIC 90 tablet 1   warfarin (COUMADIN) 6 MG tablet TAKE 1 TABLET ON MONDAY, WEDNESDAY AND FRIDAY OR AS DIRECTED BY ANTICOAGULATION CLINIC 50 tablet 3   methocarbamol (ROBAXIN) 500 MG tablet methocarbamol 500 mg tablet  Take 1 tablet 3 times a day by oral route as needed for 10 days.     No  facility-administered medications prior to visit.    ROS: Review of Systems  Constitutional:  Positive for fatigue. Negative for appetite change and unexpected weight change.  HENT:  Positive for congestion. Negative for nosebleeds, sneezing, sore throat and trouble swallowing.   Eyes:  Positive for visual disturbance. Negative for itching.  Respiratory:  Negative for cough.   Cardiovascular:  Negative for chest pain, palpitations and leg swelling.  Gastrointestinal:  Negative for abdominal distention, blood in stool, diarrhea and nausea.  Genitourinary:  Negative for frequency and hematuria.  Musculoskeletal:  Positive for gait problem. Negative for back pain, joint swelling and neck pain.  Skin:  Negative for rash.  Neurological:  Positive for headaches. Negative for dizziness, tremors, speech difficulty and weakness.  Psychiatric/Behavioral:  Negative for agitation, dysphoric mood and sleep disturbance. The patient is not nervous/anxious.     Objective:  BP 132/84 (BP Location: Left Arm)   Pulse 79   Temp 98.2 F (36.8 C) (Oral)   Ht $R'6\' 1"'Ya$  (1.854 m)   Wt (!) 310 lb 6.4 oz (140.8 kg)   SpO2 97%   BMI 40.95 kg/m   BP Readings from Last 3 Encounters:  03/17/22 132/84  11/12/21 140/82  08/08/21 (!) 144/86    Wt Readings from Last 3 Encounters:  03/17/22 (!) 310 lb 6.4 oz (140.8 kg)  11/12/21 (!) 316 lb 3.2 oz (143.4 kg)  08/08/21 (!) 311 lb (141.1 kg)    Physical Exam Constitutional:      General: He is not in acute distress.    Appearance: He is well-developed. He is obese.     Comments: NAD  Eyes:     Conjunctiva/sclera: Conjunctivae normal.     Pupils: Pupils are equal, round, and reactive to light.  Neck:     Thyroid: No thyromegaly.     Vascular: No JVD.  Cardiovascular:     Rate and Rhythm: Normal rate and regular rhythm.     Heart sounds: Normal heart sounds. No murmur heard.    No friction rub. No gallop.  Pulmonary:     Effort: Pulmonary effort is  normal. No respiratory distress.     Breath sounds: Normal breath sounds. No wheezing or rales.  Chest:     Chest wall: No tenderness.  Abdominal:     General: Bowel sounds are normal. There is no distension.     Palpations: Abdomen is soft. There is no mass.     Tenderness: There is no abdominal tenderness. There is no guarding or rebound.  Musculoskeletal:        General: No tenderness. Normal range of motion.     Cervical back: Normal range of motion.  Lymphadenopathy:     Cervical: No cervical adenopathy.  Skin:    General: Skin is warm and dry.     Findings: No rash.  Neurological:     Mental Status: He is alert and oriented to person, place, and time.     Cranial Nerves: No cranial nerve deficit.     Motor: No abnormal muscle tone.     Coordination: Coordination normal.     Gait: Gait normal.     Deep Tendon Reflexes: Reflexes are normal and symmetric.  Psychiatric:        Behavior: Behavior normal.        Thought Content: Thought content normal.        Judgment: Judgment normal.   B temples NT  Lab Results  Component Value Date   WBC 11.6 (H) 05/31/2020   HGB 14.0 05/31/2020   HCT 42.2 05/31/2020   PLT 330.0 05/31/2020   GLUCOSE 108 (H) 11/12/2021   CHOL 208 (H) 03/30/2019   TRIG 112 03/23/2020   HDL 34.40 (L) 03/30/2019   LDLDIRECT 137.0 03/10/2017   LDLCALC 141 (H) 03/30/2019   ALT 20 11/12/2021   AST 23 11/12/2021   NA 137 11/12/2021   K 4.0 11/12/2021   CL 101 11/12/2021   CREATININE 1.01 11/12/2021   BUN 20 11/12/2021   CO2 30 11/12/2021   TSH 2.97 08/06/2021   PSA 0.09 (L) 03/30/2019   INR 2.5 02/18/2022   HGBA1C 5.8 11/12/2021    MR ELBOW RIGHT WO CONTRAST  Result Date: 02/26/2021 CLINICAL DATA:  Right elbow pain and limited range of motion, fall last Saturday. EXAM: MRI OF THE RIGHT ELBOW WITHOUT CONTRAST TECHNIQUE: Multiplanar, multisequence MR imaging of the elbow was performed. No intravenous contrast was administered. COMPARISON:  None.  FINDINGS: The patient was unable to fully extend the elbow during imaging. TENDONS Common forearm flexor origin: Mild proximal tendinopathy. Common forearm extensor origin: Moderate proximal tendinopathy as shown on image 11 series 7. Biceps: Unremarkable Triceps: Unremarkable LIGAMENTS Medial stabilizers: Grossly intact Lateral stabilizers: Thickening and low-grade edema proximally in the radial collateral ligament could reflect a mild sprain. The lateral ulnar collateral ligament appears  intact. Cartilage: Moderate thinning of articular cartilage along various surfaces including the capitellum, radial head, olecranon, and parts of the femoral trochlea, without a well-defined focal chondral defect. Joint: Small to moderate joint effusion. I do not observe a free osteochondral fragment. Cubital tunnel: Abnormal laxity or prior release of the cubital retinaculum with the ulnar nerve medially deviated from its expected position for example on images 23 through 19 of series 5. Subtle edema in the ulnar nerve with a small amount of edema and a small fluid collection tracking in the proximal flexor carpi ulnaris muscle for example on image 12 series 6. Bones: Considerable spurring of the capitellum, radial head, olecranon, and trochlear groove. There is marrow edema in the prominent anterior spurring along the coronoid process which may be fragmented. Small erosion or degenerative lesion medially along the olecranon on image 4 of series 7 and image 13 of series 9. There is some supracondylar spurring which may be contributing to posterior elbow impingement IMPRESSION: 1. Prominent tricompartmental spurring including fragmented edematous spurring from the olecranon, and some supracondylar spurring along the trochlear groove which along with spurring of the electron on may contribute to posterior elbow impingement. Substantial radiocapitellar spurring and chondral thinning also noted. 2. Small to moderate elbow joint  effusion without a definite loose free osteochondral fragment. 3. Abnormal medial positioning of the ulnar nerve in the expected vicinity of the cubital tunnel possibly due to retinacular insufficiency or prior retinacular release (correlate with operative history). There is low-grade edema in the ulnar nerve, correlate with symptoms of ulnar neuropathy. There is also a small amount of edema and a small fluid collection in the adjacent proximal flexor carpi radialis muscle. 4. Moderate common extensor tendinopathy and mild common flexor tendinopathy. Low-grade thickening of the proximal radial collateral ligament could reflect a mild sprain. Electronically Signed   By: Van Clines M.D.   On: 02/26/2021 11:27    Assessment & Plan:   Problem List Items Addressed This Visit     B12 deficiency    Chronic Cont w/Vit B12      Chronic sinusitis     Recurrent  Can tolerate Ceftin, Doxy ok ENT ref      Relevant Orders   Ambulatory referral to ENT   Dyslipidemia   Relevant Orders   TSH   Lipid panel   Essential hypertension    Cont on Losartan      Relevant Orders   TSH   Lipid panel   Comprehensive metabolic panel   Sedimentation rate   CBC with Differential/Platelet   Headache - Primary    Check ESR Artery bx option discussed      Relevant Orders   TSH   Lipid panel   Comprehensive metabolic panel   Sedimentation rate   CBC with Differential/Platelet   Low back pain    Chronic LBP      Retinal vein occlusion of right eye     recent R eye retinal vein occlusion - Dr Stacie Glaze      Relevant Orders   Comprehensive metabolic panel   CBC with Differential/Platelet      No orders of the defined types were placed in this encounter.     Follow-up: Return in about 4 months (around 07/17/2022) for a follow-up visit.  Walker Kehr, MD

## 2022-03-17 NOTE — Assessment & Plan Note (Signed)
Cont on Losartan °

## 2022-03-17 NOTE — Assessment & Plan Note (Signed)
Check ESR Artery bx option discussed

## 2022-03-17 NOTE — Assessment & Plan Note (Signed)
Recurrent  Can tolerate Ceftin, Doxy ok ENT ref

## 2022-03-17 NOTE — Assessment & Plan Note (Signed)
recent R eye retinal vein occlusion - Dr Stacie Glaze

## 2022-03-18 ENCOUNTER — Encounter: Payer: Self-pay | Admitting: Internal Medicine

## 2022-03-18 IMAGING — CT CT ANGIO CHEST
2 of 6 series · 17 of 46 positions shown · IV contrast (APPLIED)
Comparison: Radiograph earlier today.  Chest CTA 07/04/2013

CLINICAL DATA: Shortness of breath. COVID positive, recent
hospitalization post discharge yesterday.

EXAM:
CT ANGIOGRAPHY CHEST WITH CONTRAST
TECHNIQUE: Multidetector CT imaging of the chest was performed using the
standard protocol during bolus administration of intravenous
contrast. Multiplanar CT image reconstructions and MIPs were
obtained to evaluate the vascular anatomy.
CONTRAST:  100mL OMNIPAQUE IOHEXOL 350 MG/ML SOLN

[Series 7: thins · axial · 0.98mm/px · z∈[+1084,+1395]mm · 14 of 341 slices shown]
[im 15/341  lung]
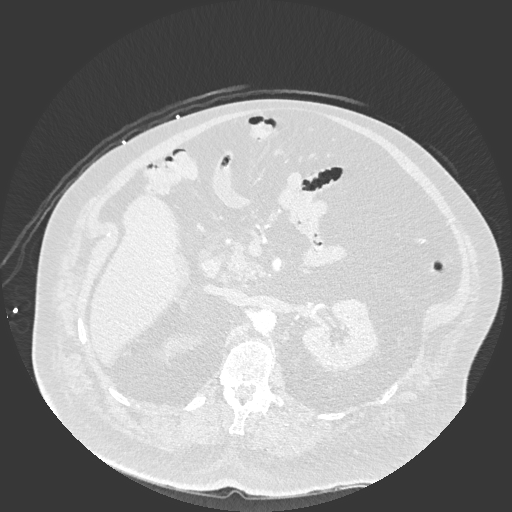
[im 45/341  soft-tissue]
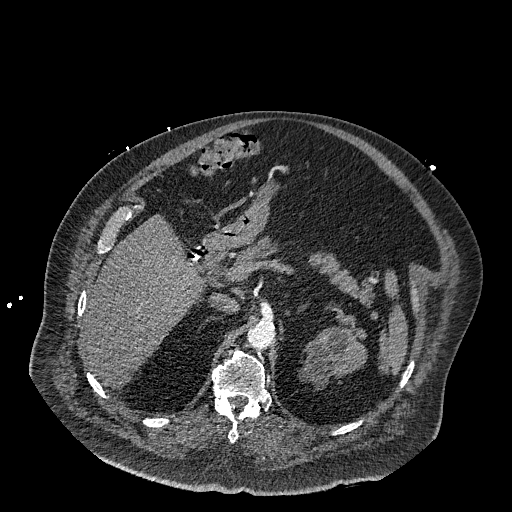
[im 60/341  lung]
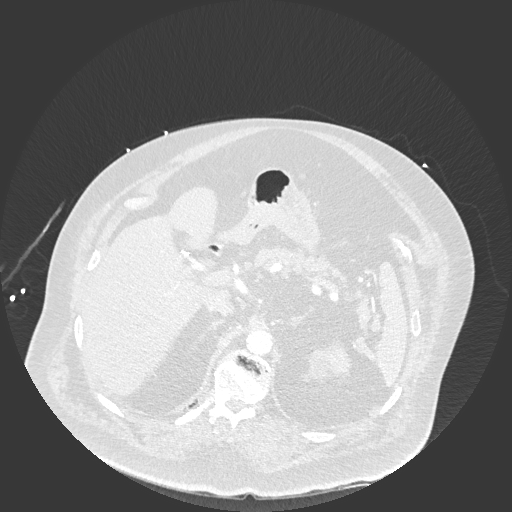
[im 89/341  soft-tissue]
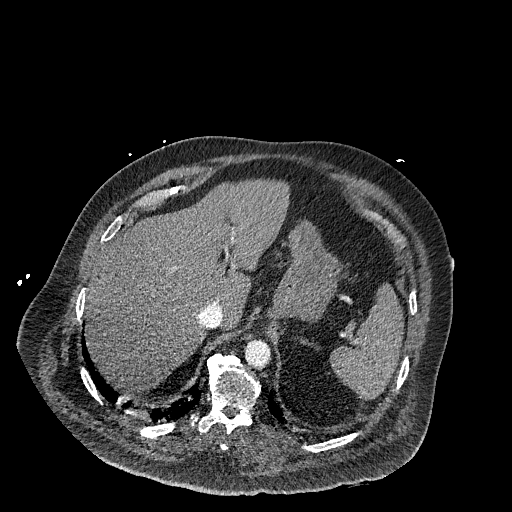
[im 119/341  lung]
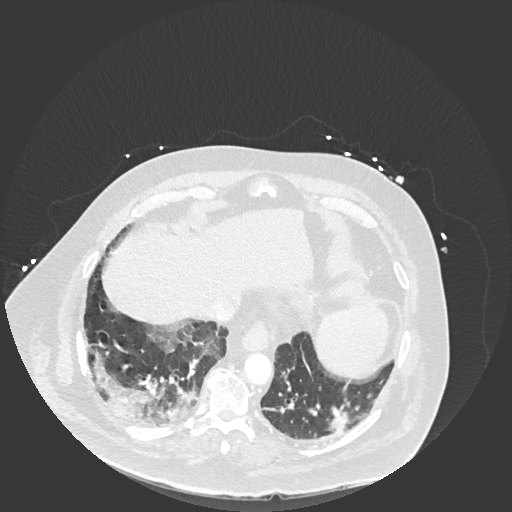
[im 134/341  soft-tissue]
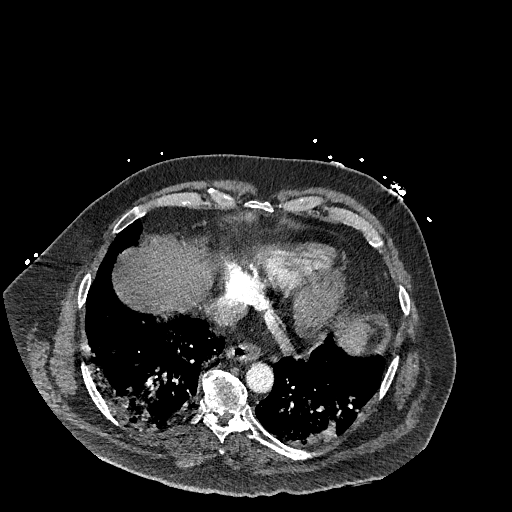
[im 163/341  lung]
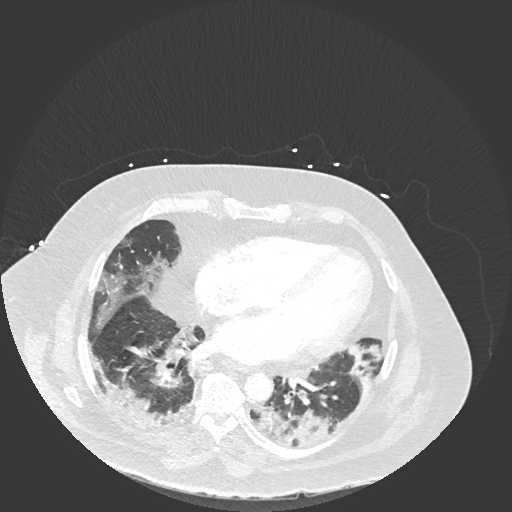
[im 178/341  soft-tissue]
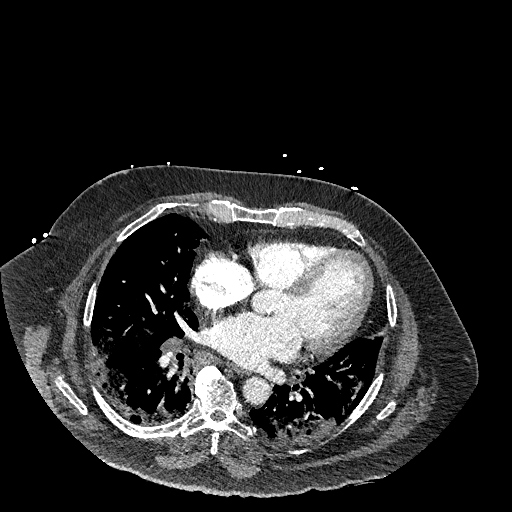
[im 207/341  lung]
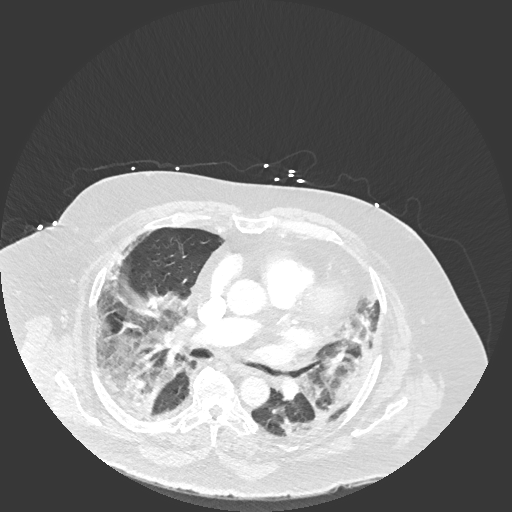
[im 222/341  soft-tissue]
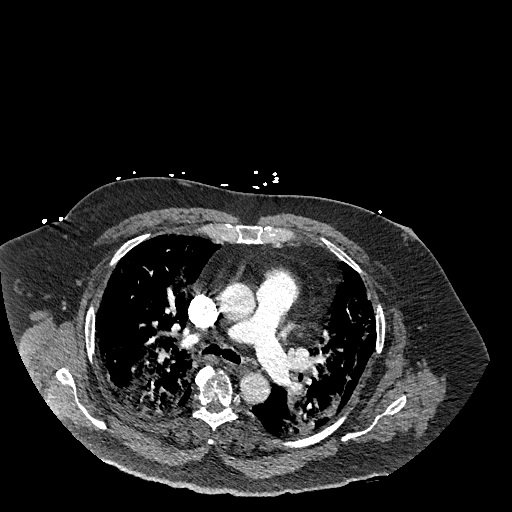
[im 252/341  lung]
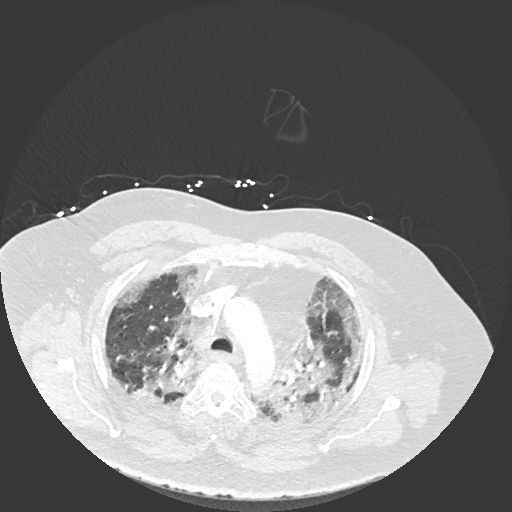
[im 281/341  soft-tissue]
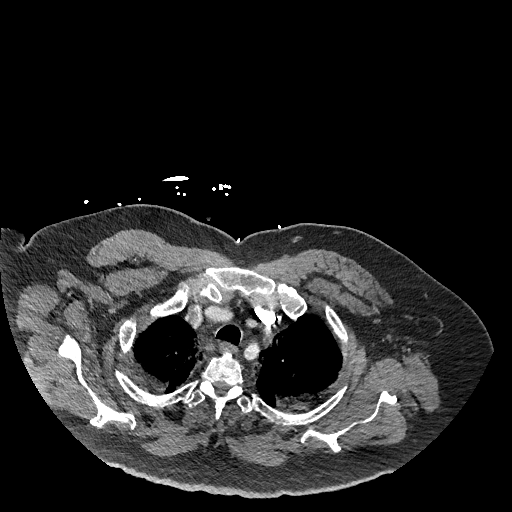
[im 296/341  lung]
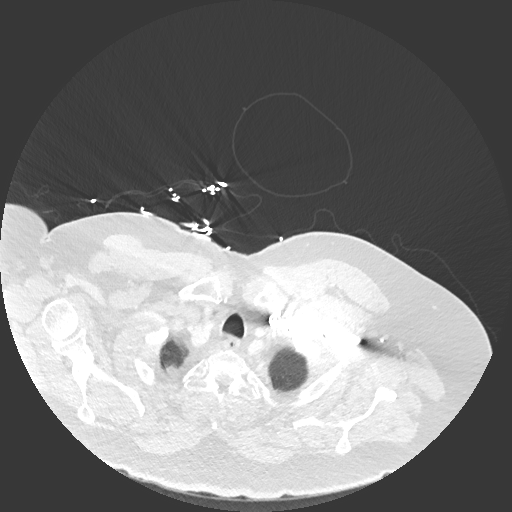
[im 326/341  soft-tissue]
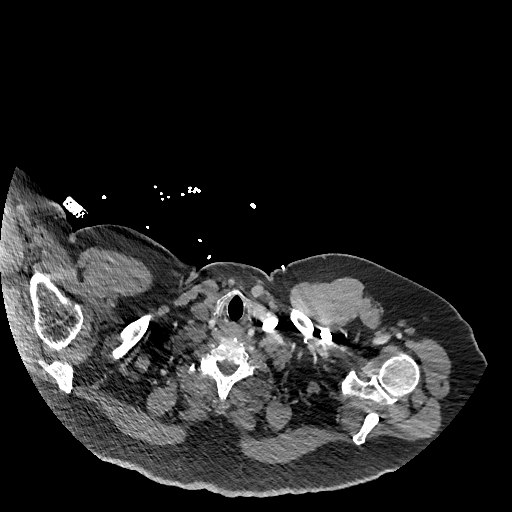

[Series 9: coronal mpr · coronal · 0.71mm/px · 3 of 193 slices shown]
[im 49/193  soft-tissue]
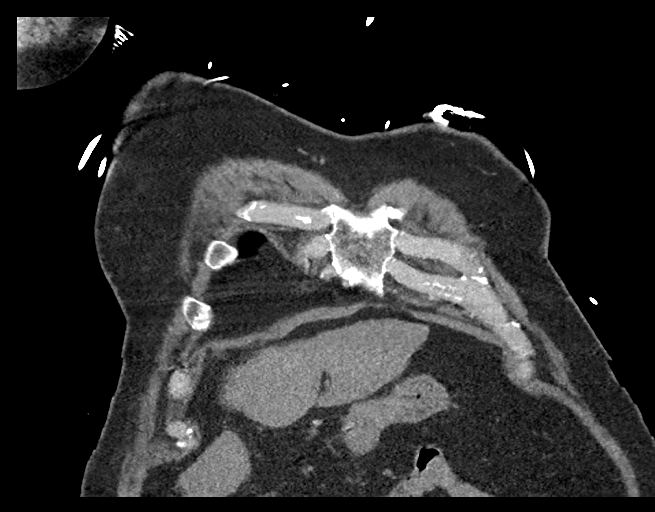
[im 97/193  soft-tissue]
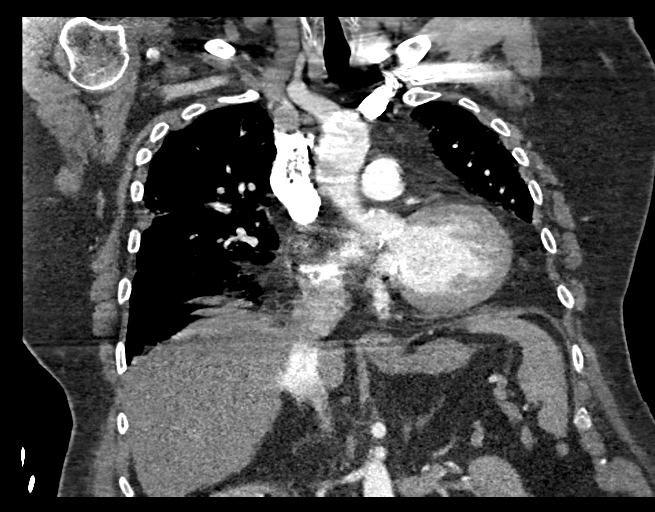
[im 145/193  soft-tissue]
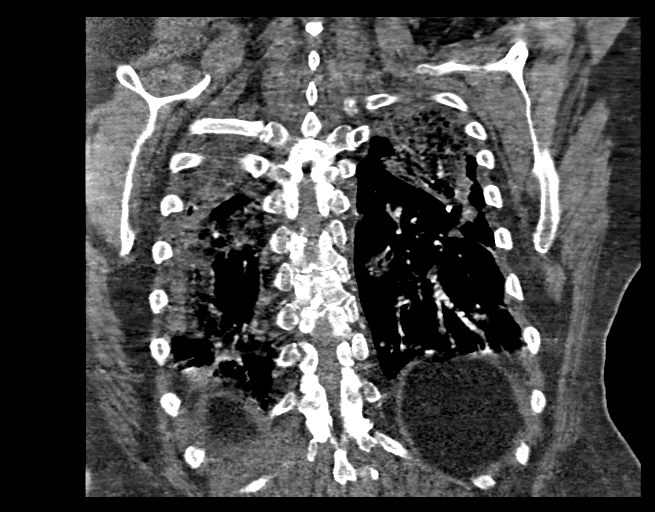

[17 of 46 positions shown; findings below may reference images not displayed]

FINDINGS: Cardiovascular: There are no filling defects within the pulmonary
arteries to suggest pulmonary embolus. There is significant
breathing motion artifact, evaluation is diagnostic to the proximal
segmental level. Cannot assess distal segmental and subsegmental
branches. Thoracic aorta is normal in caliber. There is no aortic
dissection. Moderate aortic atherosclerosis. Borderline
cardiomegaly. There mitral annulus calcifications. Coronary artery
calcifications. No pericardial effusion. Small amount of contrast
refluxes into the hepatic veins and IVC.

Mediastinum/Nodes: Small hiatal hernia. Small paratracheal and right
infrahilar nodes not enlarged by size criteria. No suspicious
thyroid nodule. No pneumomediastinum.

Lungs/Pleura: Multifocal patchy and geographic ground-glass
opacities within all lobes of both lungs. Some of these opacities
appear coalescing peripherally. No convincing septal thickening or
pulmonary edema. No pneumothorax. Trachea and central bronchi are
patent. No pleural fluid. The previous right lower lobe pulmonary
nodule is obscured by opacities on the current exam.

Upper Abdomen: Minimal contrast refluxing into the hepatic veins and
IVC. Suspected hepatic steatosis. Cholecystectomy. Multiple cysts in
the left kidney, incompletely characterized on this chest CTA, but
grossly stable from 0836.

Musculoskeletal: Multilevel degenerative change throughout the
spine. There are no acute or suspicious osseous abnormalities.

Review of the MIP images confirms the above findings.
IMPRESSION: 1. No pulmonary embolus allowing for significant breathing motion
artifact.
2. Multifocal patchy and geographic ground-glass opacities within
all lobes of both lungs, pattern consistent with [id number] pneumonia.
3. Small amount of contrast refluxing into the hepatic veins and
IVC, suggesting elevated right heart pressures.
4. Small hiatal hernia.
5. Aortic atherosclerosis and coronary artery calcifications.

Aortic Atherosclerosis ([id number]).

## 2022-03-19 DIAGNOSIS — H34831 Tributary (branch) retinal vein occlusion, right eye, with macular edema: Secondary | ICD-10-CM | POA: Diagnosis not present

## 2022-04-01 ENCOUNTER — Ambulatory Visit (INDEPENDENT_AMBULATORY_CARE_PROVIDER_SITE_OTHER): Payer: MEDICARE

## 2022-04-01 DIAGNOSIS — Z7901 Long term (current) use of anticoagulants: Secondary | ICD-10-CM | POA: Diagnosis not present

## 2022-04-01 LAB — POCT INR: INR: 2.2 (ref 2.0–3.0)

## 2022-04-01 NOTE — Progress Notes (Addendum)
Continue to take 3 mg daily except take 6 mg on Mondays, Wednesdays and Fridays.  Re-check in 6 weeks.  Medical screening examination/treatment/procedure(s) were performed by non-physician practitioner and as supervising physician I was immediately available for consultation/collaboration.  I agree with above. Lew Dawes, MD

## 2022-04-01 NOTE — Patient Instructions (Signed)
Continue to take 3 mg daily except take 6 mg on Mondays, Wednesdays and Fridays.  Re-check in 6 weeks.

## 2022-04-06 DIAGNOSIS — Z882 Allergy status to sulfonamides status: Secondary | ICD-10-CM | POA: Diagnosis not present

## 2022-04-06 DIAGNOSIS — Z888 Allergy status to other drugs, medicaments and biological substances status: Secondary | ICD-10-CM | POA: Diagnosis not present

## 2022-04-06 DIAGNOSIS — R21 Rash and other nonspecific skin eruption: Secondary | ICD-10-CM | POA: Diagnosis not present

## 2022-04-06 DIAGNOSIS — M7989 Other specified soft tissue disorders: Secondary | ICD-10-CM | POA: Diagnosis not present

## 2022-04-06 DIAGNOSIS — S9032XA Contusion of left foot, initial encounter: Secondary | ICD-10-CM | POA: Diagnosis not present

## 2022-04-06 DIAGNOSIS — M25572 Pain in left ankle and joints of left foot: Secondary | ICD-10-CM | POA: Diagnosis not present

## 2022-04-06 DIAGNOSIS — Z880 Allergy status to penicillin: Secondary | ICD-10-CM | POA: Diagnosis not present

## 2022-04-06 DIAGNOSIS — Z9109 Other allergy status, other than to drugs and biological substances: Secondary | ICD-10-CM | POA: Diagnosis not present

## 2022-04-09 ENCOUNTER — Ambulatory Visit (INDEPENDENT_AMBULATORY_CARE_PROVIDER_SITE_OTHER): Payer: MEDICARE | Admitting: Internal Medicine

## 2022-04-09 ENCOUNTER — Encounter: Payer: Self-pay | Admitting: Internal Medicine

## 2022-04-09 VITALS — BP 120/72 | HR 81 | Temp 98.3°F | Ht 73.0 in

## 2022-04-09 DIAGNOSIS — Z7901 Long term (current) use of anticoagulants: Secondary | ICD-10-CM

## 2022-04-09 DIAGNOSIS — R600 Localized edema: Secondary | ICD-10-CM

## 2022-04-09 DIAGNOSIS — M79672 Pain in left foot: Secondary | ICD-10-CM

## 2022-04-09 MED ORDER — CELECOXIB 200 MG PO CAPS: 200.0000 mg | ORAL_CAPSULE | Freq: Two times a day (BID) | ORAL | 0 refills | Status: AC

## 2022-04-09 MED ORDER — FAMOTIDINE 40 MG PO TABS: 40.0000 mg | ORAL_TABLET | Freq: Every day | ORAL | 0 refills | Status: DC

## 2022-04-09 NOTE — Assessment & Plan Note (Signed)
New L foot and ankle pain and swelling. [name] stepped out of his truck and developed pain on Tue last week. and Gershon Mussel went to ER at [facility] - no fx in the L foot/ankle on 10/15. He can't bear wt on it. R/o stress fx [facility] papers were reviewed Celebrex w/Pepcid with caution Elevate leg/foot Use surgical shoe and ankle brace Podiatry ref

## 2022-04-09 NOTE — Assessment & Plan Note (Signed)
L foot - elevate L foot

## 2022-04-09 NOTE — Progress Notes (Signed)
Subjective:  Patient ID: Robert [name], male    DOB: [birth year]  Age: 71 y.o. MRN: [id number]  CC: Follow-up (ER Follow-up)   HPI Robert [name] presents for L foot and ankle pain and swelling. Robert [name] stepped out of his truck and developed pain on Tue last week. and Robert [name] went to ER at [facility] - no fx in the L foot/ankle on 10/15. He can't bear wt on it.  He is here w/his son  Outpatient Medications Prior to Visit  Medication Sig Dispense Refill   Aflibercept (EYLEA) 2 MG/0.05ML SOLN by Intravitreal route. 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 6.7 g 0   Cholecalciferol (VITAMIN D3) 125 MCG (5000 UT) CAPS Take 1 capsule (5,000 Units total) by mouth daily. 100 capsule 3   fish oil-omega-3 fatty acids 1000 MG capsule Take 2 g by mouth every morning.     furosemide (LASIX) 20 MG tablet Take 1 tablet (20 mg total) by mouth daily. 90 tablet 3   glucosamine-chondroitin 500-400 MG tablet Take 1 tablet by mouth every morning.     nortriptyline (PAMELOR) 25 MG capsule Take 1 capsule (25 mg total) by mouth at bedtime. 90 capsule 3   predniSONE (DELTASONE) 5 MG tablet TAKE 1 TABLET DAILY WITH BREAKFAST 90 tablet 1   warfarin (COUMADIN) 3 MG tablet TAKE 1 TABLET DAILY OR TAKE AS DIRECTED BY ANTICOAGULATION CLINIC 90 tablet 1   warfarin (COUMADIN) 6 MG tablet TAKE 1 TABLET ON MONDAY, WEDNESDAY AND FRIDAY OR AS DIRECTED BY ANTICOAGULATION CLINIC 50 tablet 3   oxyCODONE (OXY IR/ROXICODONE) 5 MG immediate release tablet oxycodone 5 mg tablet  TAKE 1 TABLET BY MOUTH EVERY 6 TO 8 HOURS AS NEEDED FOR 5 DAYS (Patient not taking: Reported  on 04/09/2022)     No facility-administered medications prior to visit.    ROS: Review of Systems  Constitutional:  Negative for appetite change, fatigue and unexpected weight change.  HENT:  Negative for congestion, nosebleeds, sneezing, sore throat and trouble swallowing.   Eyes:  Negative for itching and visual disturbance.  Respiratory:  Negative for cough.   Cardiovascular:  Positive for leg swelling. Negative for chest pain and palpitations.  Gastrointestinal:  Negative for abdominal distention, blood in stool, diarrhea and nausea.  Genitourinary:  Negative for frequency and hematuria.  Musculoskeletal:  Positive for arthralgias and gait problem. Negative for back pain, joint swelling and neck pain.  Skin:  Negative for rash.  Neurological:  Negative for dizziness, tremors, speech difficulty and weakness.  Psychiatric/Behavioral:  Negative for agitation, dysphoric mood and sleep disturbance. The patient is not nervous/anxious.     Objective:  BP 120/72 (BP Location: Left Arm)   Pulse 81   Temp 98.3 F (36.8 C) (Oral)   Ht '6\' 1"'$  (1.854 m)   SpO2 97%   BMI 40.95 kg/m   BP Readings from Last 3 Encounters:  04/09/22 120/72  03/17/22 132/84  11/12/21 140/82    Wt Readings from Last 3 Encounters:  03/17/22 (!) 310 lb 6.4 oz (140.8 kg)  11/12/21 (!) 316 lb 3.2 oz (143.4 kg)  08/08/21 (!) 311 lb (141.1 kg)    Physical Exam Constitutional:      General: He is not in acute distress.    Appearance: He is well-developed.     Comments: NAD  Eyes:     Conjunctiva/sclera: Conjunctivae normal.     Pupils: Pupils are equal, round, and reactive to light.  Neck:     Thyroid: No thyromegaly.     Vascular: No JVD.  Cardiovascular:     Rate and Rhythm: Normal rate and regular rhythm.     Heart sounds: Normal heart sounds. No murmur heard.    No friction rub. No gallop.  Pulmonary:     Effort: Pulmonary effort is normal. No respiratory distress.     Breath sounds: Normal  breath sounds. No wheezing or rales.  Chest:     Chest wall: No tenderness.  Abdominal:     General: Bowel sounds are normal. There is no distension.     Palpations: Abdomen is soft. There is no mass.     Tenderness: There is no abdominal tenderness. There is no guarding or rebound.  Musculoskeletal:        General: No tenderness. Normal range of motion.     Cervical back: Normal range of motion.  Lymphadenopathy:     Cervical: No cervical adenopathy.  Skin:    General: Skin is warm and dry.     Findings: No rash.  Neurological:     Mental Status: He is alert and oriented to person, place, and time.     Cranial Nerves: No cranial nerve deficit.     Motor: No abnormal muscle tone.     Coordination: Coordination normal.     Gait: Gait abnormal.     Deep Tendon Reflexes: Reflexes are normal and symmetric.  Psychiatric:        Behavior: Behavior normal.        Thought Content: Thought content normal.        Judgment: Judgment normal.    L ankle, foot swelling and pain - diffuse. No focal tenderness.Marland KitchenMarland KitchenIn a w/c   Lab Results  Component Value Date   WBC 7.9 03/17/2022   HGB 15.8 03/17/2022   HCT 45.3 03/17/2022   PLT 234.0 03/17/2022   GLUCOSE 96 03/17/2022   CHOL 191 03/17/2022   TRIG 168.0 (H) 03/17/2022   HDL 36.20 (L) 03/17/2022   LDLDIRECT 137.0 03/10/2017   LDLCALC 121 (H) 03/17/2022   ALT 18 03/17/2022   AST 19 03/17/2022   NA 140 03/17/2022   K 3.9 03/17/2022   CL 102 03/17/2022   CREATININE 1.04 03/17/2022   BUN 16 03/17/2022   CO2 29 03/17/2022   TSH 4.49 03/17/2022   PSA 0.09 (L) 03/30/2019   INR 2.2 04/01/2022   HGBA1C 5.8 11/12/2021    MR ELBOW RIGHT WO CONTRAST  Result Date: 02/26/2021 CLINICAL DATA:  Right elbow pain and limited range of motion, fall last Saturday. EXAM: MRI OF THE RIGHT ELBOW WITHOUT CONTRAST TECHNIQUE: Multiplanar, multisequence MR imaging of the elbow was performed. No intravenous contrast was administered. COMPARISON:  None.  FINDINGS: The patient was unable to fully extend the elbow during imaging. TENDONS Common forearm flexor origin: Mild proximal tendinopathy. Common forearm extensor origin: Moderate proximal tendinopathy as shown on image 11 series 7. Biceps: Unremarkable Triceps: Unremarkable LIGAMENTS Medial stabilizers: Grossly intact Lateral stabilizers: Thickening and low-grade edema proximally in the radial collateral ligament could reflect a mild sprain. The lateral ulnar collateral ligament appears intact. Cartilage:  Moderate thinning of articular cartilage along various surfaces including the capitellum, radial head, olecranon, and parts of the femoral trochlea, without a well-defined focal chondral defect. Joint: Small to moderate joint effusion. I do not observe a free osteochondral fragment. Cubital tunnel: Abnormal laxity or prior release of the cubital retinaculum with the ulnar nerve medially deviated from its expected position for example on images 23 through 19 of series 5. Subtle edema in the ulnar nerve with a small amount of edema and a small fluid collection tracking in the proximal flexor carpi ulnaris muscle for example on image 12 series 6. Bones: Considerable spurring of the capitellum, radial head, olecranon, and trochlear groove. There is marrow edema in the prominent anterior spurring along the coronoid process which may be fragmented. Small erosion or degenerative lesion medially along the olecranon on image 4 of series 7 and image 13 of series 9. There is some supracondylar spurring which may be contributing to posterior elbow impingement IMPRESSION: 1. Prominent tricompartmental spurring including fragmented edematous spurring from the olecranon, and some supracondylar spurring along the trochlear groove which along with spurring of the electron on may contribute to posterior elbow impingement. Substantial radiocapitellar spurring and chondral thinning also noted. 2. Small to moderate elbow joint  effusion without a definite loose free osteochondral fragment. 3. Abnormal medial positioning of the ulnar nerve in the expected vicinity of the cubital tunnel possibly due to retinacular insufficiency or prior retinacular release (correlate with operative history). There is low-grade edema in the ulnar nerve, correlate with symptoms of ulnar neuropathy. There is also a small amount of edema and a small fluid collection in the adjacent proximal flexor carpi radialis muscle. 4. Moderate common extensor tendinopathy and mild common flexor tendinopathy. Low-grade thickening of the proximal radial collateral ligament could reflect a mild sprain. Electronically Signed   By: Van Clines M.D.   On: 02/26/2021 11:27    Assessment & Plan:   Problem List Items Addressed This Visit     Edema    L foot - elevate L foot      Relevant Orders   Ambulatory referral to Podiatry   Foot pain, left - Primary    New L foot and ankle pain and swelling. Robert [name] stepped out of his truck and developed pain on Tue last week. and Robert [name] went to ER at [facility] - no fx in the L foot/ankle on 10/15. He can't bear wt on it. R/o stress fx [facility] papers were reviewed Celebrex w/Pepcid with caution Elevate leg/foot Use surgical shoe and ankle brace Podiatry ref      Relevant Orders   Ambulatory referral to Podiatry   Long term current use of anticoagulant    Caution w/Celebrex Pepcid po         Meds ordered this encounter  Medications   famotidine (PEPCID) 40 MG tablet    Sig: Take 1 tablet (40 mg total) by mouth daily.    Dispense:  30 tablet    Refill:  0   celecoxib (CELEBREX) 200 MG capsule    Sig: Take 1 capsule (200 mg total) by mouth 2 (two) times daily.    Dispense:  14 capsule    Refill:  0      Follow-up: Return in about 2 weeks (around 04/23/2022) for a follow-up visit.  Walker Kehr, MD

## 2022-04-09 NOTE — Assessment & Plan Note (Signed)
Caution w/Celebrex Pepcid po

## 2022-04-16 DIAGNOSIS — H2513 Age-related nuclear cataract, bilateral: Secondary | ICD-10-CM | POA: Diagnosis not present

## 2022-04-16 DIAGNOSIS — H34831 Tributary (branch) retinal vein occlusion, right eye, with macular edema: Secondary | ICD-10-CM | POA: Diagnosis not present

## 2022-04-23 ENCOUNTER — Encounter: Payer: Self-pay | Admitting: Internal Medicine

## 2022-04-23 ENCOUNTER — Ambulatory Visit (INDEPENDENT_AMBULATORY_CARE_PROVIDER_SITE_OTHER): Payer: MEDICARE | Admitting: Internal Medicine

## 2022-04-23 DIAGNOSIS — M545 Low back pain, unspecified: Secondary | ICD-10-CM

## 2022-04-23 DIAGNOSIS — E538 Deficiency of other specified B group vitamins: Secondary | ICD-10-CM

## 2022-04-23 DIAGNOSIS — M79672 Pain in left foot: Secondary | ICD-10-CM | POA: Diagnosis not present

## 2022-04-23 DIAGNOSIS — R21 Rash and other nonspecific skin eruption: Secondary | ICD-10-CM | POA: Insufficient documentation

## 2022-04-23 DIAGNOSIS — G8929 Other chronic pain: Secondary | ICD-10-CM

## 2022-04-23 MED ORDER — CETIRIZINE HCL 10 MG PO TABS: 10.0000 mg | ORAL_TABLET | Freq: Every day | ORAL | 5 refills | Status: DC

## 2022-04-23 MED ORDER — TRIAMCINOLONE ACETONIDE 0.1 % EX CREA: 1.0000 | TOPICAL_CREAM | Freq: Three times a day (TID) | CUTANEOUS | 1 refills | Status: DC

## 2022-04-23 NOTE — Assessment & Plan Note (Signed)
Cont B12

## 2022-04-23 NOTE — Assessment & Plan Note (Signed)
Blue-Emu cream was recommended to use 2-3 times a day Using a Light Wave patch

## 2022-04-23 NOTE — Assessment & Plan Note (Addendum)
Allergic reaction ?cause since spring Start Triamc cream, Zyrtec Find the cause - ?etiology

## 2022-04-23 NOTE — Assessment & Plan Note (Signed)
Doing much better Celebrex w/Pepcid with caution -  Elevate leg/foot Use surgical shoe and ankle brace Podiatry ref if needed

## 2022-04-23 NOTE — Progress Notes (Signed)
Subjective:  Patient ID: Robert [name], male    DOB: [birth year]  Age: 71 y.o. MRN: [id number]  CC: Follow-up (2 week f/u)   HPI Robert [name] presents for   Outpatient Medications Prior to Visit  Medication Sig Dispense Refill   Aflibercept (EYLEA) 2 MG/0.05ML SOLN by Intravitreal route. 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 6.7 g 0   celecoxib (CELEBREX) 200 MG capsule Take 1 capsule (200 mg total) by mouth 2 (two) times daily. 14 capsule 0   Cholecalciferol (VITAMIN D3) 125 MCG (5000 UT) CAPS Take 1 capsule (5,000 Units total) by mouth daily. 100 capsule 3   famotidine (PEPCID) 40 MG tablet Take 1 tablet (40 mg total) by mouth daily. 30 tablet 0   fish oil-omega-3 fatty acids 1000 MG capsule Take 2 g by mouth every morning.     furosemide (LASIX) 20 MG tablet Take 1 tablet (20 mg total) by mouth daily. 90 tablet 3   glucosamine-chondroitin 500-400 MG tablet Take 1 tablet by mouth every morning.     nortriptyline (PAMELOR) 25 MG capsule Take 1 capsule (25 mg total) by mouth at bedtime. 90 capsule 3   predniSONE (DELTASONE) 5 MG tablet TAKE 1 TABLET DAILY WITH BREAKFAST 90 tablet 1   warfarin (COUMADIN) 3 MG tablet TAKE 1 TABLET DAILY OR TAKE AS DIRECTED BY ANTICOAGULATION CLINIC 90 tablet 1   warfarin (COUMADIN) 6 MG tablet TAKE 1 TABLET ON MONDAY, WEDNESDAY AND FRIDAY OR AS DIRECTED BY ANTICOAGULATION CLINIC 50 tablet 3   No facility-administered medications prior to visit.    ROS: Review of Systems  Constitutional:  Negative for appetite change, fatigue and unexpected weight change.  HENT:  Negative  for congestion, nosebleeds, sneezing, sore throat and trouble swallowing.   Eyes:  Negative for itching and visual disturbance.  Respiratory:  Negative for cough.   Cardiovascular:  Negative for chest pain, palpitations and leg swelling.  Gastrointestinal:  Negative for abdominal distention, blood in stool, diarrhea and nausea.  Genitourinary:  Negative for frequency and hematuria.  Musculoskeletal:  Positive for arthralgias and back pain. Negative for gait problem, joint swelling and neck pain.  Skin:  Negative for rash.  Neurological:  Negative for dizziness, tremors, speech difficulty and weakness.  Hematological:  Does not bruise/bleed easily.  Psychiatric/Behavioral:  Negative for agitation, dysphoric mood, sleep disturbance and suicidal ideas. The patient is not nervous/anxious.     Objective:  BP 118/82 (BP Location: Left Arm)   Temp 98.1 F (36.7 C) (Oral)   Ht '6\' 1"'$  (1.854 m)   Wt (!) 310 lb 12.8 oz (141 kg)   SpO2 96%   BMI 41.01 kg/m   BP Readings from Last 3 Encounters:  04/23/22 118/82  04/09/22 120/72  03/17/22 132/84    Wt Readings from Last 3 Encounters:  04/23/22 (!) 310 lb 12.8 oz (141 kg)  03/17/22 (!) 310 lb 6.4 oz (140.8 kg)  11/12/21 (!) 316 lb 3.2 oz (143.4 kg)    Physical Exam Constitutional:      General: He is not in acute distress.    Appearance:  He is well-developed. He is obese.     Comments: NAD  Eyes:     Conjunctiva/sclera: Conjunctivae normal.     Pupils: Pupils are equal, round, and reactive to light.  Neck:     Thyroid: No thyromegaly.     Vascular: No JVD.  Cardiovascular:     Rate and Rhythm: Normal rate and regular rhythm.     Heart sounds: Normal heart sounds. No murmur heard.    No friction rub. No gallop.  Pulmonary:     Effort: Pulmonary effort is normal. No respiratory distress.     Breath sounds: Normal breath sounds. No wheezing or rales.  Chest:     Chest wall: No tenderness.  Abdominal:     General: Bowel sounds  are normal. There is no distension.     Palpations: Abdomen is soft. There is no mass.     Tenderness: There is no abdominal tenderness. There is no guarding or rebound.  Musculoskeletal:        General: No tenderness. Normal range of motion.     Cervical back: Normal range of motion.  Lymphadenopathy:     Cervical: No cervical adenopathy.  Skin:    General: Skin is warm and dry.     Findings: No rash.  Neurological:     Mental Status: He is alert and oriented to person, place, and time.     Cranial Nerves: No cranial nerve deficit.     Motor: No abnormal muscle tone.     Coordination: Coordination normal.     Gait: Gait normal.     Deep Tendon Reflexes: Reflexes are normal and symmetric.  Psychiatric:        Behavior: Behavior normal.        Thought Content: Thought content normal.        Judgment: Judgment normal.   Rash on trunk legs, arms - eryth and scaly  Lab Results  Component Value Date   WBC 7.9 03/17/2022   HGB 15.8 03/17/2022   HCT 45.3 03/17/2022   PLT 234.0 03/17/2022   GLUCOSE 96 03/17/2022   CHOL 191 03/17/2022   TRIG 168.0 (H) 03/17/2022   HDL 36.20 (L) 03/17/2022   LDLDIRECT 137.0 03/10/2017   LDLCALC 121 (H) 03/17/2022   ALT 18 03/17/2022   AST 19 03/17/2022   NA 140 03/17/2022   K 3.9 03/17/2022   CL 102 03/17/2022   CREATININE 1.04 03/17/2022   BUN 16 03/17/2022   CO2 29 03/17/2022   TSH 4.49 03/17/2022   PSA 0.09 (L) 03/30/2019   INR 2.2 04/01/2022   HGBA1C 5.8 11/12/2021    MR ELBOW RIGHT WO CONTRAST  Result Date: 02/26/2021 CLINICAL DATA:  Right elbow pain and limited range of motion, fall last Saturday. EXAM: MRI OF THE RIGHT ELBOW WITHOUT CONTRAST TECHNIQUE: Multiplanar, multisequence MR imaging of the elbow was performed. No intravenous contrast was administered. COMPARISON:  None. FINDINGS: The patient was unable to fully extend the elbow during imaging. TENDONS Common forearm flexor origin: Mild proximal tendinopathy. Common forearm  extensor origin: Moderate proximal tendinopathy as shown on image 11 series 7. Biceps: Unremarkable Triceps: Unremarkable LIGAMENTS Medial stabilizers: Grossly intact Lateral stabilizers: Thickening and low-grade edema proximally in the radial collateral ligament could reflect a mild sprain. The lateral ulnar collateral ligament appears intact. Cartilage: Moderate thinning of articular cartilage along various surfaces including the capitellum, radial head, olecranon, and parts of the femoral trochlea, without a well-defined focal chondral defect. Joint: Small to moderate joint  effusion. I do not observe a free osteochondral fragment. Cubital tunnel: Abnormal laxity or prior release of the cubital retinaculum with the ulnar nerve medially deviated from its expected position for example on images 23 through 19 of series 5. Subtle edema in the ulnar nerve with a small amount of edema and a small fluid collection tracking in the proximal flexor carpi ulnaris muscle for example on image 12 series 6. Bones: Considerable spurring of the capitellum, radial head, olecranon, and trochlear groove. There is marrow edema in the prominent anterior spurring along the coronoid process which may be fragmented. Small erosion or degenerative lesion medially along the olecranon on image 4 of series 7 and image 13 of series 9. There is some supracondylar spurring which may be contributing to posterior elbow impingement IMPRESSION: 1. Prominent tricompartmental spurring including fragmented edematous spurring from the olecranon, and some supracondylar spurring along the trochlear groove which along with spurring of the electron on may contribute to posterior elbow impingement. Substantial radiocapitellar spurring and chondral thinning also noted. 2. Small to moderate elbow joint effusion without a definite loose free osteochondral fragment. 3. Abnormal medial positioning of the ulnar nerve in the expected vicinity of the cubital tunnel  possibly due to retinacular insufficiency or prior retinacular release (correlate with operative history). There is low-grade edema in the ulnar nerve, correlate with symptoms of ulnar neuropathy. There is also a small amount of edema and a small fluid collection in the adjacent proximal flexor carpi radialis muscle. 4. Moderate common extensor tendinopathy and mild common flexor tendinopathy. Low-grade thickening of the proximal radial collateral ligament could reflect a mild sprain. Electronically Signed   By: Van Clines M.D.   On: 02/26/2021 11:27    Assessment & Plan:   Problem List Items Addressed This Visit     B12 deficiency    Cont B12      Foot pain, left    Doing much better Celebrex w/Pepcid with caution -  Elevate leg/foot Use surgical shoe and ankle brace Podiatry ref if needed      Low back pain    Blue-Emu cream was recommended to use 2-3 times a day Using a Light Wave patch      Rash and nonspecific skin eruption    Allergic reaction ?cause since spring Start Triamc cream, Zyrtec Find the cause - ?etiology          Meds ordered this encounter  Medications   triamcinolone cream (KENALOG) 0.1 %    Sig: Apply 1 Application topically 3 (three) times daily.    Dispense:  450 g    Refill:  1   cetirizine (ZYRTEC ALLERGY) 10 MG tablet    Sig: Take 1 tablet (10 mg total) by mouth daily.    Dispense:  30 tablet    Refill:  5      Follow-up: Return in about 3 months (around 07/24/2022) for a follow-up visit.  Walker Kehr, MD

## 2022-04-30 ENCOUNTER — Other Ambulatory Visit: Payer: Self-pay | Admitting: Internal Medicine

## 2022-05-08 DIAGNOSIS — R040 Epistaxis: Secondary | ICD-10-CM | POA: Diagnosis not present

## 2022-05-08 DIAGNOSIS — J320 Chronic maxillary sinusitis: Secondary | ICD-10-CM | POA: Diagnosis not present

## 2022-05-08 DIAGNOSIS — Z7901 Long term (current) use of anticoagulants: Secondary | ICD-10-CM | POA: Diagnosis not present

## 2022-05-09 DIAGNOSIS — J320 Chronic maxillary sinusitis: Secondary | ICD-10-CM | POA: Diagnosis not present

## 2022-05-09 DIAGNOSIS — J342 Deviated nasal septum: Secondary | ICD-10-CM | POA: Diagnosis not present

## 2022-05-13 ENCOUNTER — Ambulatory Visit (INDEPENDENT_AMBULATORY_CARE_PROVIDER_SITE_OTHER): Payer: MEDICARE

## 2022-05-13 DIAGNOSIS — Z7901 Long term (current) use of anticoagulants: Secondary | ICD-10-CM

## 2022-05-13 LAB — POCT INR: INR: 2.7 (ref 2.0–3.0)

## 2022-05-13 NOTE — Progress Notes (Cosign Needed Addendum)
Pt stopped taking 3 mg tablets and started breaking 6 mg tablets in half on days requiring 3 mg dose.   Continue to take 3 mg (1/2 tablet) daily except take 6 mg (1 tablet) on Mondays, Wednesdays and Fridays.  Recheck in 6 weeks.  Medical screening examination/treatment/procedure(s) were performed by non-physician practitioner and as supervising physician I was immediately available for consultation/collaboration.  I agree with above. Lew Dawes, MD

## 2022-05-13 NOTE — Patient Instructions (Signed)
Continue to take 3 mg daily except take 6 mg on Mondays, Wednesdays and Fridays.  Recheck on 06/24/22 at 9:00.

## 2022-05-14 DIAGNOSIS — H34831 Tributary (branch) retinal vein occlusion, right eye, with macular edema: Secondary | ICD-10-CM | POA: Diagnosis not present

## 2022-05-28 ENCOUNTER — Other Ambulatory Visit: Payer: Self-pay | Admitting: Internal Medicine

## 2022-05-29 DIAGNOSIS — J340 Abscess, furuncle and carbuncle of nose: Secondary | ICD-10-CM | POA: Diagnosis not present

## 2022-06-03 ENCOUNTER — Other Ambulatory Visit: Payer: Self-pay | Admitting: Internal Medicine

## 2022-06-11 DIAGNOSIS — H34831 Tributary (branch) retinal vein occlusion, right eye, with macular edema: Secondary | ICD-10-CM | POA: Diagnosis not present

## 2022-06-24 ENCOUNTER — Ambulatory Visit (INDEPENDENT_AMBULATORY_CARE_PROVIDER_SITE_OTHER): Payer: MEDICARE

## 2022-06-24 DIAGNOSIS — Z7901 Long term (current) use of anticoagulants: Secondary | ICD-10-CM | POA: Diagnosis not present

## 2022-06-24 LAB — POCT INR: INR: 4.6 — AB (ref 2.0–3.0)

## 2022-06-24 NOTE — Patient Instructions (Addendum)
Pre visit review using our clinic review tool, if applicable. No additional management support is needed unless otherwise documented below in the visit note.  Hold dose today and then decrease dose tomorrow and only take 3 mg and then continue to take 3 mg (1/2 tablet) daily except take 6 mg (1 tablet) on Mondays, Wednesdays and Fridays.  Recheck in 2 weeks.

## 2022-06-24 NOTE — Progress Notes (Addendum)
Pt reports taking celebrex yesterday and he does not normally take that. Due to interaction with this medication there will not be a change in weekly dosing because pt has been stable on this for over 8 months.   Hold dose today and then decrease dose tomorrow and only take 3 mg and then continue to take 3 mg (1/2 tablet) daily except take 6 mg (1 tablet) on Mondays, Wednesdays and Fridays.  Recheck in 2 weeks.  Medical screening examination/treatment/procedure(s) were performed by non-physician practitioner and as supervising physician I was immediately available for consultation/collaboration.  I agree with above. Lew Dawes, MD

## 2022-07-08 ENCOUNTER — Ambulatory Visit (INDEPENDENT_AMBULATORY_CARE_PROVIDER_SITE_OTHER): Payer: MEDICARE

## 2022-07-08 DIAGNOSIS — Z7901 Long term (current) use of anticoagulants: Secondary | ICD-10-CM | POA: Diagnosis not present

## 2022-07-08 LAB — POCT INR: INR: 3.5 — AB (ref 2.0–3.0)

## 2022-07-08 NOTE — Patient Instructions (Addendum)
Pre visit review using our clinic review tool, if applicable. No additional management support is needed unless otherwise documented below in the visit note.  Hold dose today and then change weekly dose to take 1/2 tablet daily except take 1 tablet on Mondays and Fridays.  Recheck in 3 weeks.

## 2022-07-08 NOTE — Progress Notes (Addendum)
Pt denies any changes except not eating well due to a nerve in his jaw that is painful and making it difficult for him to eat.  Hold dose today and then change weekly dose to take 1/2 tablet daily except take 1 tablet on Mondays and Fridays.  Recheck in 3 weeks.   Medical screening examination/treatment/procedure(s) were performed by non-physician practitioner and as supervising physician I was immediately available for consultation/collaboration.  I agree with above. Lew Dawes, MD

## 2022-07-09 ENCOUNTER — Encounter: Payer: Self-pay | Admitting: Internal Medicine

## 2022-07-09 DIAGNOSIS — H34831 Tributary (branch) retinal vein occlusion, right eye, with macular edema: Secondary | ICD-10-CM | POA: Diagnosis not present

## 2022-07-13 ENCOUNTER — Other Ambulatory Visit: Payer: Self-pay | Admitting: Internal Medicine

## 2022-07-13 MED ORDER — PREGABALIN 50 MG PO CAPS: 50.0000 mg | ORAL_CAPSULE | Freq: Two times a day (BID) | ORAL | 0 refills | Status: DC | PRN

## 2022-07-14 ENCOUNTER — Ambulatory Visit (INDEPENDENT_AMBULATORY_CARE_PROVIDER_SITE_OTHER): Payer: MEDICARE | Admitting: Internal Medicine

## 2022-07-14 ENCOUNTER — Encounter: Payer: Self-pay | Admitting: Internal Medicine

## 2022-07-14 VITALS — BP 160/102 | HR 90 | Temp 98.2°F | Ht 73.0 in | Wt 316.0 lb

## 2022-07-14 DIAGNOSIS — G44229 Chronic tension-type headache, not intractable: Secondary | ICD-10-CM | POA: Diagnosis not present

## 2022-07-14 DIAGNOSIS — Z5181 Encounter for therapeutic drug level monitoring: Secondary | ICD-10-CM | POA: Diagnosis not present

## 2022-07-14 DIAGNOSIS — I10 Essential (primary) hypertension: Secondary | ICD-10-CM

## 2022-07-14 MED ORDER — VALACYCLOVIR HCL 1 G PO TABS: 1000.0000 mg | ORAL_TABLET | Freq: Three times a day (TID) | ORAL | 0 refills | Status: AC

## 2022-07-14 MED ORDER — PREGABALIN 75 MG PO CAPS: 75.0000 mg | ORAL_CAPSULE | Freq: Three times a day (TID) | ORAL | 3 refills | Status: DC | PRN

## 2022-07-14 NOTE — Progress Notes (Signed)
Subjective:  Patient ID: Robert [name], male    DOB: [birth year]  Age: 72 y.o. MRN: [id number]  CC: No chief complaint on file.   HPI Robert [name] presents for R HA: trigeminal neuralgia 5/10. Lyrica helped.  HTN, anticoagulation  Outpatient Medications Prior to Visit  Medication Sig Dispense Refill   Aflibercept (EYLEA) 2 MG/0.05ML SOLN by Intravitreal route. 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 6.7 g 0   celecoxib (CELEBREX) 200 MG capsule Take 1 capsule (200 mg total) by mouth 2 (two) times daily. 14 capsule 0   cetirizine (ZYRTEC ALLERGY) 10 MG tablet Take 1 tablet (10 mg total) by mouth daily. 30 tablet 5   Cholecalciferol (VITAMIN D3) 125 MCG (5000 UT) CAPS Take 1 capsule (5,000 Units total) by mouth daily. 100 capsule 3   famotidine (PEPCID) 40 MG tablet Take 1 tablet by mouth once daily 30 tablet 5   fish oil-omega-3 fatty acids 1000 MG capsule Take 2 g by mouth every morning.     furosemide (LASIX) 20 MG tablet TAKE 1 TABLET DAILY 90 tablet 3   glucosamine-chondroitin 500-400 MG tablet Take 1 tablet by mouth every morning.     nortriptyline (PAMELOR) 25 MG capsule TAKE 1 CAPSULE AT BEDTIME 90 capsule 3   predniSONE (DELTASONE) 5 MG tablet TAKE 1 TABLET DAILY WITH BREAKFAST 90 tablet 3   triamcinolone cream (KENALOG) 0.1 % Apply 1 Application topically 3 (three) times daily. 450 g 1   warfarin (COUMADIN) 3 MG tablet TAKE 1 TABLET DAILY OR TAKE AS DIRECTED BY ANTICOAGULATION CLINIC 90 tablet 1   warfarin (COUMADIN) 6 MG tablet TAKE 1 TABLET ON MONDAY, WEDNESDAY AND FRIDAY OR AS DIRECTED BY  ANTICOAGULATION CLINIC 50 tablet 3   pregabalin (LYRICA) 50 MG capsule Take 1 capsule (50 mg total) by mouth 2 (two) times daily as needed. 60 capsule 0   No facility-administered medications prior to visit.    ROS: Review of Systems  Constitutional:  Negative for appetite change, fatigue and unexpected weight change.  HENT:  Negative for congestion, nosebleeds, sneezing, sore throat and trouble swallowing.   Eyes:  Negative for itching and visual disturbance.  Respiratory:  Negative for cough.   Cardiovascular:  Negative for chest pain, palpitations and leg swelling.  Gastrointestinal:  Positive for constipation. Negative for abdominal distention, blood in stool, diarrhea and nausea.  Genitourinary:  Negative for frequency and hematuria.  Musculoskeletal:  Negative for back pain, gait problem, joint swelling and neck pain.  Skin:  Negative for rash.  Neurological:  Negative for dizziness, tremors, speech difficulty and weakness.  Psychiatric/Behavioral:  Negative for agitation, dysphoric mood and sleep disturbance. The patient is not nervous/anxious.     Objective:  BP (!) 160/102 (BP Location: Right Arm, Patient Position: Sitting, Cuff Size: Normal)   Pulse 90   Temp 98.2 F (36.8 C) (Oral)   Ht '6\' 1"'$  (1.854 m)   Wt (!) 316 lb (143.3 kg)   SpO2 95%   BMI 41.69 kg/m   BP Readings from Last 3 Encounters:  07/14/22 (!) 160/102  04/23/22 118/82  04/09/22 120/72    Wt Readings from Last 3 Encounters:  07/14/22 (!) 316 lb (143.3 kg)  04/23/22 (!) 310 lb 12.8 oz (141 kg)  03/17/22 (!) 310 lb 6.4 oz (140.8 kg)    Physical Exam Constitutional:      General: He is not in acute distress.    Appearance: He is well-developed. He is obese.     Comments: NAD  Eyes:     Conjunctiva/sclera: Conjunctivae normal.     Pupils: Pupils are equal, round, and reactive to light.  Neck:     Thyroid: No thyromegaly.     Vascular: No JVD.  Cardiovascular:     Rate and Rhythm: Normal rate  and regular rhythm.     Heart sounds: Normal heart sounds. No murmur heard.    No friction rub. No gallop.  Pulmonary:     Effort: Pulmonary effort is normal. No respiratory distress.     Breath sounds: Normal breath sounds. No wheezing or rales.  Chest:     Chest wall: No tenderness.  Abdominal:     General: Bowel sounds are normal. There is no distension.     Palpations: Abdomen is soft. There is no mass.     Tenderness: There is no abdominal tenderness. There is no guarding or rebound.  Musculoskeletal:        General: No tenderness. Normal range of motion.     Cervical back: Normal range of motion.  Lymphadenopathy:     Cervical: No cervical adenopathy.  Skin:    General: Skin is warm and dry.     Findings: No rash.  Neurological:     Mental Status: He is alert and oriented to person, place, and time.     Cranial Nerves: No cranial nerve deficit.     Motor: No abnormal muscle tone.     Coordination: Coordination normal.     Gait: Gait normal.     Deep Tendon Reflexes: Reflexes are normal and symmetric.  Psychiatric:        Behavior: Behavior normal.        Thought Content: Thought content normal.        Judgment: Judgment normal.     Lab Results  Component Value Date   WBC 7.9 03/17/2022   HGB 15.8 03/17/2022   HCT 45.3 03/17/2022   PLT 234.0 03/17/2022   GLUCOSE 96 03/17/2022   CHOL 191 03/17/2022   TRIG 168.0 (H) 03/17/2022   HDL 36.20 (L) 03/17/2022   LDLDIRECT 137.0 03/10/2017   LDLCALC 121 (H) 03/17/2022   ALT 18 03/17/2022   AST 19 03/17/2022   NA 140 03/17/2022   K 3.9 03/17/2022   CL 102 03/17/2022   CREATININE 1.04 03/17/2022   BUN 16 03/17/2022   CO2 29 03/17/2022   TSH 4.49 03/17/2022   PSA 0.09 (L) 03/30/2019   INR 3.5 (A) 07/08/2022   HGBA1C 5.8 11/12/2021    MR ELBOW RIGHT WO CONTRAST  Result Date: 02/26/2021 CLINICAL DATA:  Right elbow pain and limited range of motion, fall last Saturday. EXAM: MRI OF THE RIGHT ELBOW WITHOUT CONTRAST  TECHNIQUE: Multiplanar, multisequence MR imaging of the elbow was performed. No intravenous contrast was administered. COMPARISON:  None. FINDINGS: The patient was unable to fully extend the elbow during imaging. TENDONS Common forearm flexor origin: Mild proximal tendinopathy. Common forearm extensor origin: Moderate proximal tendinopathy as shown on image 11 series 7. Biceps: Unremarkable Triceps: Unremarkable LIGAMENTS Medial stabilizers: Grossly intact Lateral stabilizers: Thickening and low-grade  edema proximally in the radial collateral ligament could reflect a mild sprain. The lateral ulnar collateral ligament appears intact. Cartilage: Moderate thinning of articular cartilage along various surfaces including the capitellum, radial head, olecranon, and parts of the femoral trochlea, without a well-defined focal chondral defect. Joint: Small to moderate joint effusion. I do not observe a free osteochondral fragment. Cubital tunnel: Abnormal laxity or prior release of the cubital retinaculum with the ulnar nerve medially deviated from its expected position for example on images 23 through 19 of series 5. Subtle edema in the ulnar nerve with a small amount of edema and a small fluid collection tracking in the proximal flexor carpi ulnaris muscle for example on image 12 series 6. Bones: Considerable spurring of the capitellum, radial head, olecranon, and trochlear groove. There is marrow edema in the prominent anterior spurring along the coronoid process which may be fragmented. Small erosion or degenerative lesion medially along the olecranon on image 4 of series 7 and image 13 of series 9. There is some supracondylar spurring which may be contributing to posterior elbow impingement IMPRESSION: 1. Prominent tricompartmental spurring including fragmented edematous spurring from the olecranon, and some supracondylar spurring along the trochlear groove which along with spurring of the electron on may contribute to  posterior elbow impingement. Substantial radiocapitellar spurring and chondral thinning also noted. 2. Small to moderate elbow joint effusion without a definite loose free osteochondral fragment. 3. Abnormal medial positioning of the ulnar nerve in the expected vicinity of the cubital tunnel possibly due to retinacular insufficiency or prior retinacular release (correlate with operative history). There is low-grade edema in the ulnar nerve, correlate with symptoms of ulnar neuropathy. There is also a small amount of edema and a small fluid collection in the adjacent proximal flexor carpi radialis muscle. 4. Moderate common extensor tendinopathy and mild common flexor tendinopathy. Low-grade thickening of the proximal radial collateral ligament could reflect a mild sprain. Electronically Signed   By: Van Clines M.D.   On: 02/26/2021 11:27    Assessment & Plan:   Problem List Items Addressed This Visit       Cardiovascular and Mediastinum   Essential hypertension    Cont on Losartan        Other   Encounter for therapeutic drug monitoring    Cont on Coumadin      Headache - Primary    R Trigeminal neuralgia, recurrent Valtrex x 10 d Lyrica 75 mg tid prn Blue-Emu cream was recommended to use 2-3 times a day       Relevant Medications   pregabalin (LYRICA) 75 MG capsule      Meds ordered this encounter  Medications   pregabalin (LYRICA) 75 MG capsule    Sig: Take 1 capsule (75 mg total) by mouth 3 (three) times daily as needed.    Dispense:  90 capsule    Refill:  3   valACYclovir (VALTREX) 1000 MG tablet    Sig: Take 1 tablet (1,000 mg total) by mouth 3 (three) times daily for 10 days.    Dispense:  30 tablet    Refill:  0      Follow-up: Return in about 3 months (around 10/13/2022) for a follow-up visit.  Walker Kehr, MD

## 2022-07-14 NOTE — Patient Instructions (Signed)
Use rice sock heating pad

## 2022-07-14 NOTE — Assessment & Plan Note (Signed)
Cont on Coumadin

## 2022-07-14 NOTE — Assessment & Plan Note (Signed)
Cont on Losartan °

## 2022-07-14 NOTE — Assessment & Plan Note (Signed)
R Trigeminal neuralgia, recurrent Valtrex x 10 d Lyrica 75 mg tid prn Blue-Emu cream was recommended to use 2-3 times a day

## 2022-07-20 DIAGNOSIS — M25572 Pain in left ankle and joints of left foot: Secondary | ICD-10-CM | POA: Diagnosis not present

## 2022-07-21 ENCOUNTER — Ambulatory Visit (INDEPENDENT_AMBULATORY_CARE_PROVIDER_SITE_OTHER): Payer: MEDICARE | Admitting: Internal Medicine

## 2022-07-21 ENCOUNTER — Telehealth: Payer: Self-pay | Admitting: Internal Medicine

## 2022-07-21 ENCOUNTER — Encounter: Payer: Self-pay | Admitting: Internal Medicine

## 2022-07-21 ENCOUNTER — Ambulatory Visit: Payer: MEDICARE | Admitting: Internal Medicine

## 2022-07-21 VITALS — BP 120/80 | HR 113 | Temp 97.5°F | Ht 73.0 in | Wt 315.0 lb

## 2022-07-21 DIAGNOSIS — M79672 Pain in left foot: Secondary | ICD-10-CM | POA: Diagnosis not present

## 2022-07-21 NOTE — Telephone Encounter (Signed)
[place] colappsed on foor. Foot started swelliong wednesday

## 2022-07-21 NOTE — Progress Notes (Unsigned)
   Subjective:   Patient ID: Robert [name], male    DOB: Nov 23, 1950, 72 y.o.   MRN: [id number]  HPI The patient is a 72 YO man coming in for left foot pain. Already had x-rays with ortho urgent care took 6 x-rays.  Review of Systems  Objective:  Physical Exam  Vitals:   07/21/22 1527  BP: 120/80  Pulse: (!) 113  Temp: (!) 97.5 F (36.4 C)  TempSrc: Oral  SpO2: 99%  Weight: (!) 315 lb (142.9 kg)  Height: '6\' 1"'$  (1.854 m)    Assessment & Plan:

## 2022-07-21 NOTE — Patient Instructions (Signed)
You likely do not have a blood clot.  Keep off the foot and let the swelling go down.

## 2022-07-22 ENCOUNTER — Encounter: Payer: Self-pay | Admitting: Internal Medicine

## 2022-07-22 NOTE — Assessment & Plan Note (Signed)
Due to injury about 1 week ago with wood falling against his foot. Patient reports over the weekend he did go to urgent care through ortho and had x-rays negative for fracture. Swelling is decreased today. Good capillary refill on left foot but cannot palpate pulses due to swelling. No calf tenderness. 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 No calf tenderness and swelling is reducing. Monitor over the next week. If persistent pain or persistent swelling we can consider study.

## 2022-07-29 ENCOUNTER — Ambulatory Visit (INDEPENDENT_AMBULATORY_CARE_PROVIDER_SITE_OTHER): Payer: MEDICARE

## 2022-07-29 DIAGNOSIS — Z7901 Long term (current) use of anticoagulants: Secondary | ICD-10-CM

## 2022-07-29 LAB — POCT INR: INR: 3.7 — AB (ref 2.0–3.0)

## 2022-07-29 NOTE — Patient Instructions (Addendum)
Pre visit review using our clinic review tool, if applicable. No additional management support is needed unless otherwise documented below in the visit note.  Hold dose today and then change weekly dose to take 1/2 tablet daily except take 1 tablet on Fridays.  Recheck in 2 weeks.

## 2022-07-29 NOTE — Progress Notes (Cosign Needed Addendum)
Pt denies any changes. Had a foot injury but has healed. Was prescribed Valtrex for nerve pain in jaw.  Pt called back after the visit to report the eye injections he receives did not start until after his INR has already been elevated. He reports his eye physician told him the injections would not be absorbed into the body and would not interact with warfarin. Advised pt to watch for s/s of bleeding or abnormal bruising and if he had any to contact office or go to the ER. Pt verbalized understanding.  Hold dose today and then change weekly dose to take 1/2 tablet daily except take 1 tablet on Fridays.  Recheck in 2 weeks.  Medical screening examination/treatment/procedure(s) were performed by non-physician practitioner and as supervising physician I was immediately available for consultation/collaboration.  I agree with above. Lew Dawes, MD

## 2022-08-06 DIAGNOSIS — H2513 Age-related nuclear cataract, bilateral: Secondary | ICD-10-CM | POA: Diagnosis not present

## 2022-08-06 DIAGNOSIS — H34831 Tributary (branch) retinal vein occlusion, right eye, with macular edema: Secondary | ICD-10-CM | POA: Diagnosis not present

## 2022-08-12 ENCOUNTER — Ambulatory Visit (INDEPENDENT_AMBULATORY_CARE_PROVIDER_SITE_OTHER): Payer: MEDICARE

## 2022-08-12 DIAGNOSIS — Z7901 Long term (current) use of anticoagulants: Secondary | ICD-10-CM

## 2022-08-12 LAB — POCT INR: INR: 2.3 (ref 2.0–3.0)

## 2022-08-12 NOTE — Progress Notes (Addendum)
Pt finished Valtrex one week ago and is now concerned his warfarin dose is too low. Pt will return in one week to recheck INR.  Continue 1/2 tablet daily except take 1 tablet on Fridays.  Recheck in 1 weeks.  Medical screening examination/treatment/procedure(s) were performed by non-physician practitioner and as supervising physician I was immediately available for consultation/collaboration.  I agree with above. Lew Dawes, MD

## 2022-08-12 NOTE — Patient Instructions (Addendum)
Pre visit review using our clinic review tool, if applicable. No additional management support is needed unless otherwise documented below in the visit note.  Continue 1/2 tablet daily except take 1 tablet on Fridays.  Recheck in 1 weeks.

## 2022-08-13 ENCOUNTER — Telehealth: Payer: Self-pay

## 2022-08-13 NOTE — Telephone Encounter (Signed)
Pt called to report he has started taking allopurinol due to gout in feet. This will elevate INR. He has apt for INR check on 2/27. Advised to watch for s/s of bleeding and if he had any contact the clinic or go to the ER. Pt verbalized understanding.

## 2022-08-19 ENCOUNTER — Ambulatory Visit (INDEPENDENT_AMBULATORY_CARE_PROVIDER_SITE_OTHER): Payer: MEDICARE

## 2022-08-19 DIAGNOSIS — Z7901 Long term (current) use of anticoagulants: Secondary | ICD-10-CM

## 2022-08-19 LAB — POCT INR: INR: 2.4 (ref 2.0–3.0)

## 2022-08-19 NOTE — Progress Notes (Addendum)
Pt started allopurinol last week. Continue 1/2 tablet daily except take 1 tablet on Fridays.  Recheck in 6 weeks.  Medical screening examination/treatment/procedure(s) were performed by non-physician practitioner and as supervising physician I was immediately available for consultation/collaboration.  I agree with above. Lew Dawes, MD

## 2022-08-19 NOTE — Patient Instructions (Addendum)
Pre visit review using our clinic review tool, if applicable. No additional management support is needed unless otherwise documented below in the visit note.  Continue 1/2 tablet daily except take 1 tablet on Fridays.  Recheck in 6 weeks.

## 2022-08-26 DIAGNOSIS — Z79899 Other long term (current) drug therapy: Secondary | ICD-10-CM | POA: Diagnosis not present

## 2022-08-26 DIAGNOSIS — I517 Cardiomegaly: Secondary | ICD-10-CM | POA: Diagnosis not present

## 2022-08-26 DIAGNOSIS — Z91048 Other nonmedicinal substance allergy status: Secondary | ICD-10-CM | POA: Diagnosis not present

## 2022-08-26 DIAGNOSIS — Z7901 Long term (current) use of anticoagulants: Secondary | ICD-10-CM | POA: Diagnosis not present

## 2022-08-26 DIAGNOSIS — R0602 Shortness of breath: Secondary | ICD-10-CM | POA: Diagnosis not present

## 2022-08-26 DIAGNOSIS — U071 COVID-19: Secondary | ICD-10-CM | POA: Diagnosis not present

## 2022-08-26 DIAGNOSIS — Z888 Allergy status to other drugs, medicaments and biological substances status: Secondary | ICD-10-CM | POA: Diagnosis not present

## 2022-08-26 DIAGNOSIS — Z86718 Personal history of other venous thrombosis and embolism: Secondary | ICD-10-CM | POA: Diagnosis not present

## 2022-08-26 DIAGNOSIS — Z880 Allergy status to penicillin: Secondary | ICD-10-CM | POA: Diagnosis not present

## 2022-08-26 DIAGNOSIS — Z882 Allergy status to sulfonamides status: Secondary | ICD-10-CM | POA: Diagnosis not present

## 2022-08-26 DIAGNOSIS — Z86711 Personal history of pulmonary embolism: Secondary | ICD-10-CM | POA: Diagnosis not present

## 2022-08-26 DIAGNOSIS — I4891 Unspecified atrial fibrillation: Secondary | ICD-10-CM | POA: Diagnosis not present

## 2022-08-27 ENCOUNTER — Telehealth: Payer: Self-pay | Admitting: Internal Medicine

## 2022-08-27 NOTE — Telephone Encounter (Signed)
Pt called wanted to let Dr. Camila Li know he did tested positive for Covid at the ER yesterday. Pt also inquiring about some type of medication for him.  Best call # [phone]

## 2022-08-27 NOTE — Telephone Encounter (Signed)
Dr. Lennox Solders is out of the office until Friday 08/29/22. If he was in the ER and wan;t given antiviral he will have to be seen or virtual appt by another provider to be rx something. Pls call pt and inform & schedule.Marland KitchenJohny Chess

## 2022-09-01 ENCOUNTER — Telehealth: Payer: Self-pay | Admitting: Internal Medicine

## 2022-09-01 ENCOUNTER — Encounter: Payer: Self-pay | Admitting: Internal Medicine

## 2022-09-01 MED ORDER — COLCHICINE 0.6 MG PO TABS: ORAL_TABLET | ORAL | 1 refills | Status: DC

## 2022-09-01 NOTE — Telephone Encounter (Signed)
Prescription Request  09/01/2022  LOV: 07/14/2022  What is the name of the medication or equipment? Colchicine  Have you contacted your pharmacy to request a refill? No   Which pharmacy would you like this sent to?  [facility], [address] [place] [address] Phone: [phone] Fax: [phone]  Patient notified that their request is being sent to the clinical staff for review and that they should receive a response within 2 business days.   Please advise at Mobile [phone] (mobile)    Patient said he is having a gout flare up and would like to know if he can get this medication filled. He declined scheduling an appointment.

## 2022-09-01 NOTE — Telephone Encounter (Signed)
Pls advise med not on med list../lmb

## 2022-09-01 NOTE — Telephone Encounter (Signed)
Okay.  Thanks.

## 2022-09-10 DIAGNOSIS — H34831 Tributary (branch) retinal vein occlusion, right eye, with macular edema: Secondary | ICD-10-CM | POA: Diagnosis not present

## 2022-09-11 ENCOUNTER — Telehealth: Payer: Self-pay

## 2022-09-11 NOTE — Telephone Encounter (Signed)
Pt reports he has been taking allopurinol for gout flare and letting coumadin clinic know since he was advised to contact clinic if he is using this medication because it does interact with warfarin.   Advised pt to watch for s/s of abnormal bruising or bleed. Pt reports he has had some scabs in his nose but this has occurred intermittently and no bleeding incidents. Advised if any s/s to contact clinic or go to the ER. Pt verbalized understanding.   Advised pt it would be best to check INR next week. RS apt for 3/26.

## 2022-09-16 ENCOUNTER — Ambulatory Visit (INDEPENDENT_AMBULATORY_CARE_PROVIDER_SITE_OTHER): Payer: MEDICARE

## 2022-09-16 ENCOUNTER — Telehealth: Payer: Self-pay

## 2022-09-16 DIAGNOSIS — Z7901 Long term (current) use of anticoagulants: Secondary | ICD-10-CM

## 2022-09-16 LAB — POCT INR: INR: 2.3 (ref 2.0–3.0)

## 2022-09-16 NOTE — Patient Instructions (Addendum)
Pre visit review using our clinic review tool, if applicable. No additional management support is needed unless otherwise documented below in the visit note.  Continue 1/2 tablet daily except take 1 tablet on Fridays.  Recheck in 6 weeks. 

## 2022-09-16 NOTE — Telephone Encounter (Signed)
Pt in today for INR check and requested refill of allopurinol. He is also using colchicine. Pt reports he has used it in the past and it has helped. He takes 100 mg daily. Advised pt a msg would be sent to PCP for request.  Preferred pharmacy is [facility], [place]

## 2022-09-16 NOTE — Progress Notes (Addendum)
Pt has been using allopurinol for gout flare. Also had bloody scabs in nose. No nose bleeds. Continue 1/2 tablet daily except take 1 tablet on Fridays.  Recheck in 6 weeks.  Pt also requested refill of allopurinol. Not on his medication list. Advised a msg would be sent to PCP to request refill. Pharmacy is [facility], [place]  Sent msg to PCP.  Medical screening examination/treatment/procedure(s) were performed by non-physician practitioner and as supervising physician I was immediately available for consultation/collaboration.  I agree with above. Lew Dawes, MD

## 2022-09-17 MED ORDER — ALLOPURINOL 100 MG PO TABS: 100.0000 mg | ORAL_TABLET | Freq: Every day | ORAL | 1 refills | Status: DC

## 2022-09-17 NOTE — Telephone Encounter (Signed)
Okay.  Prescription emailed.  Thanks 

## 2022-09-22 DIAGNOSIS — H25811 Combined forms of age-related cataract, right eye: Secondary | ICD-10-CM | POA: Diagnosis not present

## 2022-09-22 DIAGNOSIS — H25812 Combined forms of age-related cataract, left eye: Secondary | ICD-10-CM | POA: Diagnosis not present

## 2022-09-26 ENCOUNTER — Ambulatory Visit: Payer: MEDICARE

## 2022-09-30 DIAGNOSIS — M79641 Pain in right hand: Secondary | ICD-10-CM | POA: Diagnosis not present

## 2022-10-08 ENCOUNTER — Encounter: Payer: Self-pay | Admitting: Internal Medicine

## 2022-10-09 NOTE — Telephone Encounter (Signed)
Already received 3 surgical clearance forms for the eye surgery. Form has been placed in MD folder for completion.Robert KitchenRaechel Chute

## 2022-10-09 NOTE — Telephone Encounter (Signed)
Patient dropped off document Surgical Clearance, to be filled out by provider. Patient requested to send it via Call Patient to pick up within ASAP. Document is located in providers tray at front office.Please advise at  [phone]

## 2022-10-13 ENCOUNTER — Other Ambulatory Visit: Payer: Self-pay | Admitting: Internal Medicine

## 2022-10-16 ENCOUNTER — Encounter: Payer: Self-pay | Admitting: Internal Medicine

## 2022-10-16 ENCOUNTER — Ambulatory Visit (INDEPENDENT_AMBULATORY_CARE_PROVIDER_SITE_OTHER): Payer: MEDICARE | Admitting: Internal Medicine

## 2022-10-16 VITALS — BP 128/88 | HR 85 | Temp 97.9°F | Ht 73.0 in | Wt 317.0 lb

## 2022-10-16 DIAGNOSIS — E538 Deficiency of other specified B group vitamins: Secondary | ICD-10-CM

## 2022-10-16 DIAGNOSIS — M109 Gout, unspecified: Secondary | ICD-10-CM | POA: Insufficient documentation

## 2022-10-16 DIAGNOSIS — G8929 Other chronic pain: Secondary | ICD-10-CM

## 2022-10-16 DIAGNOSIS — M545 Low back pain, unspecified: Secondary | ICD-10-CM | POA: Diagnosis not present

## 2022-10-16 DIAGNOSIS — M1A09X0 Idiopathic chronic gout, multiple sites, without tophus (tophi): Secondary | ICD-10-CM

## 2022-10-16 DIAGNOSIS — U099 Post covid-19 condition, unspecified: Secondary | ICD-10-CM

## 2022-10-16 DIAGNOSIS — I471 Supraventricular tachycardia, unspecified: Secondary | ICD-10-CM | POA: Diagnosis not present

## 2022-10-16 DIAGNOSIS — R040 Epistaxis: Secondary | ICD-10-CM | POA: Diagnosis not present

## 2022-10-16 LAB — COMPREHENSIVE METABOLIC PANEL
ALT: 19 U/L (ref 0–53)
AST: 23 U/L (ref 0–37)
Albumin: 4.1 g/dL (ref 3.5–5.2)
Alkaline Phosphatase: 68 U/L (ref 39–117)
BUN: 15 mg/dL (ref 6–23)
CO2: 30 mEq/L (ref 19–32)
Calcium: 9.2 mg/dL (ref 8.4–10.5)
Chloride: 101 mEq/L (ref 96–112)
Creatinine, Ser: 1.14 mg/dL (ref 0.40–1.50)
GFR: 64.91 mL/min (ref >60.00)
Glucose, Bld: 104 mg/dL — ABNORMAL HIGH (ref 70–99)
Potassium: 4.7 mEq/L (ref 3.5–5.1)
Sodium: 138 mEq/L (ref 135–145)
Total Bilirubin: 0.7 mg/dL (ref 0.2–1.2)
Total Protein: 8.1 g/dL (ref 6.0–8.3)

## 2022-10-16 LAB — URIC ACID: Uric Acid, Serum: 7.1 mg/dL (ref 4.0–7.8)

## 2022-10-16 LAB — VITAMIN B12: Vitamin B-12: 259 pg/mL (ref 211–911)

## 2022-10-16 NOTE — Assessment & Plan Note (Addendum)
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

## 2022-10-16 NOTE — Assessment & Plan Note (Addendum)
Off Amiodarone due to side effects F/u w/Dr Cristal Deer

## 2022-10-16 NOTE — Assessment & Plan Note (Signed)
Cont B12 

## 2022-10-16 NOTE — Progress Notes (Signed)
Subjective:  Patient ID: ABE SCHOOLS, male    DOB: 1950-10-26  Age: 72 y.o. MRN: [id number]  CC: No chief complaint on file.   HPI Zoran Yankee Epstein presents for LBP, GERD, asthma, blurred vision - cataract surgery next week... Pt had COVID in Jan 2024 - it triggered gout  He is here w/his wife [name]  Outpatient Medications Prior to Visit  Medication Sig Dispense Refill   Aflibercept (EYLEA) 2 MG/0.05ML SOLN by Intravitreal route. 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 6.7 g 0   allopurinol (ZYLOPRIM) 100 MG tablet Take 1 tablet (100 mg total) by mouth daily. 90 tablet 1   celecoxib (CELEBREX) 200 MG capsule Take 1 capsule (200 mg total) by mouth 2 (two) times daily. 14 capsule 0   cetirizine (ZYRTEC) 10 MG tablet Take 1 tablet by mouth once daily 30 tablet 5   Cholecalciferol (VITAMIN D3) 125 MCG (5000 UT) CAPS Take 1 capsule (5,000 Units total) by mouth daily. 100 capsule 3   colchicine 0.6 MG tablet Take two tablets prn gout attack.Then take another one in 1-2 hrs. Do not repeat for 3 days. 18 tablet 1   famotidine (PEPCID) 40 MG tablet Take 1 tablet by mouth once daily 30 tablet 5   fish oil-omega-3 fatty acids 1000 MG capsule Take 2 g by mouth every morning.     furosemide (LASIX) 20 MG tablet TAKE 1 TABLET DAILY 90 tablet 3   glucosamine-chondroitin 500-400 MG tablet Take 1 tablet by mouth every morning.     nortriptyline (PAMELOR) 25 MG capsule TAKE 1 CAPSULE AT BEDTIME 90 capsule 3   predniSONE (DELTASONE) 5 MG tablet TAKE 1 TABLET DAILY WITH BREAKFAST 90 tablet 3   pregabalin (LYRICA) 75 MG capsule  Take 1 capsule (75 mg total) by mouth 3 (three) times daily as needed. 90 capsule 3   triamcinolone cream (KENALOG) 0.1 % Apply 1 Application topically 3 (three) times daily. 450 g 1   warfarin (COUMADIN) 3 MG tablet TAKE 1 TABLET DAILY OR TAKE AS DIRECTED BY ANTICOAGULATION CLINIC 90 tablet 1   warfarin (COUMADIN) 6 MG tablet TAKE 1 TABLET ON MONDAY, WEDNESDAY AND FRIDAY OR AS DIRECTED BY ANTICOAGULATION CLINIC 50 tablet 3   No facility-administered medications prior to visit.    ROS: Review of Systems  Constitutional:  Positive for fatigue. Negative for appetite change and unexpected weight change.  HENT:  Negative for congestion, nosebleeds, sneezing, sore throat and trouble swallowing.   Eyes:  Negative for itching and visual disturbance.  Respiratory:  Negative for cough.   Cardiovascular:  Positive for leg swelling. Negative for chest pain and palpitations.  Gastrointestinal:  Negative for abdominal distention, blood in stool, diarrhea and nausea.  Genitourinary:  Negative for frequency and hematuria.  Musculoskeletal:  Positive for arthralgias and back pain. Negative for gait problem, joint swelling and neck pain.  Skin:  Negative for rash.  Neurological:  Negative for dizziness, tremors, speech difficulty and weakness.  Psychiatric/Behavioral:  Negative for agitation, dysphoric mood and sleep disturbance. The patient is not nervous/anxious.     Objective:  BP 128/88 (BP Location: Left Arm, Patient Position: Sitting, Cuff Size: Normal)   Pulse 85   Temp 97.9 F (36.6 C) (Oral)   Ht  (1.854 m)   Wt (!) 317 lb (143.8 kg)   SpO2 97%   BMI 41.82 kg/m   BP Readings from Last 3 Encounters:  10/16/22 128/88  07/21/22 120/80  07/14/22 (!) 160/102    Wt Readings from Last 3 Encounters:  10/16/22 (!) 317 lb (143.8 kg)  07/21/22 (!) 315 lb (142.9 kg)  07/14/22 (!) 316 lb (143.3 kg)    Physical Exam Constitutional:      General: He is not in acute distress.     Appearance: He is well-developed. He is obese.     Comments: NAD  Eyes:     Conjunctiva/sclera: Conjunctivae normal.     Pupils: Pupils are equal, round, and reactive to light.  Neck:     Thyroid: No thyromegaly.     Vascular: No JVD.  Cardiovascular:     Rate and Rhythm: Normal rate. Rhythm irregular.     Heart sounds: Murmur heard.     No friction rub. No gallop.  Pulmonary:     Effort: Pulmonary effort is normal. No respiratory distress.     Breath sounds: Normal breath sounds. No wheezing or rales.  Chest:     Chest wall: No tenderness.  Abdominal:     General: Bowel sounds are normal. There is no distension.     Palpations: Abdomen is soft. There is no mass.     Tenderness: There is no abdominal tenderness. There is no guarding or rebound.  Musculoskeletal:        General: Tenderness present. Normal range of motion.     Cervical back: Normal range of motion.  Lymphadenopathy:     Cervical: No cervical adenopathy.  Skin:    General: Skin is warm and dry.     Findings: No rash.  Neurological:     Mental Status: He is alert and oriented to person, place, and time.     Cranial Nerves: No cranial nerve deficit.     Motor: No abnormal muscle tone.     Coordination: Coordination normal.     Gait: Gait abnormal.     Deep Tendon Reflexes: Reflexes are normal and symmetric.  Psychiatric:        Behavior: Behavior normal.        Thought Content: Thought content normal.        Judgment: Judgment normal.   Irreg beats  Lab Results  Component Value Date   WBC 7.9 03/17/2022   HGB 15.8 03/17/2022   HCT 45.3 03/17/2022   PLT 234.0 03/17/2022   GLUCOSE 96 03/17/2022   CHOL 191 03/17/2022   TRIG 168.0 (H) 03/17/2022   HDL 36.20 (L) 03/17/2022   LDLDIRECT 137.0 03/10/2017   LDLCALC 121 (H) 03/17/2022   ALT 18 03/17/2022   AST 19 03/17/2022   NA 140 03/17/2022   K 3.9 03/17/2022   CL 102 03/17/2022   CREATININE 1.04 03/17/2022   BUN 16 03/17/2022   CO2 29 03/17/2022    TSH 4.49 03/17/2022   PSA 0.09 (L) 03/30/2019   INR 2.3 09/16/2022   HGBA1C 5.8 11/12/2021    MR ELBOW RIGHT WO CONTRAST  Result Date: 02/26/2021 CLINICAL DATA:  Right elbow pain and limited range of motion, fall last Saturday. EXAM: MRI OF THE RIGHT ELBOW WITHOUT CONTRAST TECHNIQUE: Multiplanar, multisequence MR imaging of the elbow was performed.  No intravenous contrast was administered. COMPARISON:  None. FINDINGS: The patient was unable to fully extend the elbow during imaging. TENDONS Common forearm flexor origin: Mild proximal tendinopathy. Common forearm extensor origin: Moderate proximal tendinopathy as shown on image 11 series 7. Biceps: Unremarkable Triceps: Unremarkable LIGAMENTS Medial stabilizers: Grossly intact Lateral stabilizers: Thickening and low-grade edema proximally in the radial collateral ligament could reflect a mild sprain. The lateral ulnar collateral ligament appears intact. Cartilage: Moderate thinning of articular cartilage along various surfaces including the capitellum, radial head, olecranon, and parts of the femoral trochlea, without a well-defined focal chondral defect. Joint: Small to moderate joint effusion. I do not observe a free osteochondral fragment. Cubital tunnel: Abnormal laxity or prior release of the cubital retinaculum with the ulnar nerve medially deviated from its expected position for example on images 23 through 19 of series 5. Subtle edema in the ulnar nerve with a small amount of edema and a small fluid collection tracking in the proximal flexor carpi ulnaris muscle for example on image 12 series 6. Bones: Considerable spurring of the capitellum, radial head, olecranon, and trochlear groove. There is marrow edema in the prominent anterior spurring along the coronoid process which may be fragmented. Small erosion or degenerative lesion medially along the olecranon on image 4 of series 7 and image 13 of series 9. There is some supracondylar spurring which may  be contributing to posterior elbow impingement IMPRESSION: 1. Prominent tricompartmental spurring including fragmented edematous spurring from the olecranon, and some supracondylar spurring along the trochlear groove which along with spurring of the electron on may contribute to posterior elbow impingement. Substantial radiocapitellar spurring and chondral thinning also noted. 2. Small to moderate elbow joint effusion without a definite loose free osteochondral fragment. 3. Abnormal medial positioning of the ulnar nerve in the expected vicinity of the cubital tunnel possibly due to retinacular insufficiency or prior retinacular release (correlate with operative history). There is low-grade edema in the ulnar nerve, correlate with symptoms of ulnar neuropathy. There is also a small amount of edema and a small fluid collection in the adjacent proximal flexor carpi radialis muscle. 4. Moderate common extensor tendinopathy and mild common flexor tendinopathy. Low-grade thickening of the proximal radial collateral ligament could reflect a mild sprain. Electronically Signed   By: Gaylyn Rong M.D.   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     Follow-up: Return in about 3 months (around 01/15/2023) for a follow-up visit.  Sonda Primes, MD

## 2022-10-16 NOTE — Assessment & Plan Note (Signed)
Gout - on Rx

## 2022-10-16 NOTE — Assessment & Plan Note (Signed)
Blue-Emu cream was recommended to use 2-3 times a day Using a Light Wave patch  

## 2022-10-16 NOTE — Assessment & Plan Note (Signed)
Recurrent - seeing ENT

## 2022-10-20 ENCOUNTER — Ambulatory Visit: Payer: MEDICARE | Admitting: Internal Medicine

## 2022-10-22 DIAGNOSIS — H25811 Combined forms of age-related cataract, right eye: Secondary | ICD-10-CM | POA: Diagnosis not present

## 2022-10-22 DIAGNOSIS — H269 Unspecified cataract: Secondary | ICD-10-CM | POA: Diagnosis not present

## 2022-10-23 ENCOUNTER — Encounter (HOSPITAL_BASED_OUTPATIENT_CLINIC_OR_DEPARTMENT_OTHER): Payer: Self-pay | Admitting: Cardiology

## 2022-10-23 ENCOUNTER — Ambulatory Visit (INDEPENDENT_AMBULATORY_CARE_PROVIDER_SITE_OTHER): Payer: MEDICARE | Admitting: Cardiology

## 2022-10-23 VITALS — BP 138/72 | HR 104 | Ht 71.0 in | Wt 317.0 lb

## 2022-10-23 DIAGNOSIS — Z7901 Long term (current) use of anticoagulants: Secondary | ICD-10-CM

## 2022-10-23 DIAGNOSIS — I350 Nonrheumatic aortic (valve) stenosis: Secondary | ICD-10-CM

## 2022-10-23 DIAGNOSIS — R600 Localized edema: Secondary | ICD-10-CM

## 2022-10-23 DIAGNOSIS — D6869 Other thrombophilia: Secondary | ICD-10-CM

## 2022-10-23 DIAGNOSIS — R0609 Other forms of dyspnea: Secondary | ICD-10-CM

## 2022-10-23 DIAGNOSIS — I4821 Permanent atrial fibrillation: Secondary | ICD-10-CM | POA: Diagnosis not present

## 2022-10-23 NOTE — Progress Notes (Signed)
Cardiology Office Note:    Date:  10/23/2022   ID:  Robert [name], DOB [birth year], MRN [id number]  PCP:  Tresa Garter, MD  Cardiologist:  Jodelle Red, MD  Referring MD: Tresa Garter, MD   CC:  Follow-up  History of Present Illness:    Robert [name] is a 72 y.o. male with a hx of severe Covid infection requiring prolonged hospitalization, atrial fibrillation (persistent, started with Covid), prior DVT/PE, dyslipidemia, obesity, paroxysmal SVT who is seen in follow up at the request of [name], Georgina Quint, MD for the evaluation and management of arrhythmia. He was previously seen by cardiology in 04/2014 by Dr. Eden Emms. His initial consult with me was on 04/23/18. Please see that note for full review of his history.  At his last appointment he was slowly recovering from Covid. Had intermittent episodes of hard beats/feeling like stopping and starting. Generally felt poorly, very fatigued, having nerve pain in both arms. Still using O2 at night. Feels that Covid has affected every location he has had an injury/surgery before. In atrial fib, rate controlled at rest, still rises with exertion. We agreed to continue with amiodarone.  He presented to the [facility] ED 08/26/2022 complaining of ongoing productive cough, DOE, and diarrhea. At the ER he tested positive for Covid-19. Labs were unremarkable. Discharged on Zofran. Per ED notes, personally reviewed, his EKG showed: Diagnosis Atrial fibrillation with rapid ventricular response at 112 bpm  Nonspecific ST and T wave abnormality  Abnormal ECG  When compared with ECG of 30-Mar-2018 16:01,  Atrial fibrillation has replaced Sinus rhythm  Non-specific change in ST segment in Inferior leads  Nonspecific T wave abnormality, worse in Inferior leads   Today, he is accompanied by a family member. He notes that when he had Covid-19 in March he was not given treatments specifically for Covid-19 as it was uncertain how they  would interact with coumadin. Also he had a triggered gout flare-up in the bottom of his feet, which was a new location for him. Prior to his Covid infection he was still short of breath. He was walking and doing well until his prior fall last Spring. Subsequently he began noticing shortness of breath that has been gradually worsening. If he walks 100-150 feet in the yard or tries to use the push mower he will be short of breath. He is using hiking sticks for balance, and will be very winded after completing 1/4 mile up the street (previously able to walk 1 mile and split wood). Also if he turns the wrong way, he might be unable to walk again for a time given his chronic back issues.  At night his breathing is stable since he sleeps in a recliner. If he tilts backwards to any degree, he will start coughing. He states that as long as he is at rest he is able to breathe normally.  Still a lot of swelling in his bilateral ankles despite wearing compression socks, which he is wearing today. There is still some numbness in his bilateral hands. Improved from before.  Didn't have anesthesia for cataract surgery. No changes were made to his coumadin prior to his eye surgery.  He denies any palpitations, chest pain, lightheadedness, headaches, syncope, orthopnea, or PND.   Past Medical History:  Diagnosis Date   Anxiety    Asthma    "no attack since acupuncture in 1990's"   Complication of anesthesia    SLOW TO WAKE UP / DIFFICULTY RESPONDING 2003, 3 days  before could use left arm, "wild/crazy sometimes"   Cyst of left kidney    "benign"   DVT (deep venous thrombosis) (HCC) 2003   right femoral artery "from groin to knee"   Edema    Right Leg w/ h/o DVT postphlebitic   H/O blood transfusion reaction 2004   FFP, extreme swelling, could not breath   Headache(784.0)    MIGRAINES-not for a long time   History of pulmonary embolism 2003   both lungs   Hyperlipidemia    Knee pain    left   LBP (low  back pain)    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, clear in 2012"   Obesity    Osteoarthritis    Peripheral vascular disease (HCC)    "poor circulation in  RT leg"   PONV (postoperative nausea and vomiting)    during surgery in 1990's   Spondylolisthesis    Warfarin anticoagulation    Wheezing    when laying on left side    Past Surgical History:  Procedure Laterality Date   APPENDECTOMY  1993   Dr Jamey Ripa   CHOLECYSTECTOMY  1993   FINGER SURGERY  2012   RT THUMB RECONSTRUCTION   HAND SURGERY Right    abcess   HEMORROIDECTOMY  1981   I & D KNEE WITH POLY EXCHANGE Left 04/04/2013   Procedure: IRRIGATION AND DEBRIDEMENT KNEE WITH POLY EXCHANGE AND INSERTION OF CEMENT BEADS;  Surgeon: Loanne Drilling, MD;  Location: [facility];  Service: Orthopedics;  Laterality: Left;   KNEE ARTHROSCOPY  1990   RT KNEE   KNEE ARTHROSCOPY Right 2003   KNEE ARTHROSCOPY  1988   L KNEE   KNEE ARTHROSCOPY WITH MENISCAL REPAIR Left 10/22/2012   Procedure: LEFT KNEE ARTHROSCOPY PARTIAL MEDIAL AND LATERAL MENISCECTOMY AND DEBRIDEMENT, CHONDROPLASTY ;  Surgeon: Javier Docker, MD;  Location: [facility];  Service: Orthopedics;  Laterality: Left;   LUMBAR DISC SURGERY     LUMBAR FUSION  2003   Dr Jillyn Hidden   ORCHIECTOMY  1994   R post injury   TONSILLECTOMY  1967   TOTAL KNEE ARTHROPLASTY Left 03/21/2013   Procedure: LEFT TOTAL KNEE ARTHROPLASTY;  Surgeon: Loanne Drilling, MD;  Location: [facility];  Service: Orthopedics;  Laterality: Left;    Current Medications: Current Outpatient Medications on File Prior to Visit  Medication Sig   Aflibercept (EYLEA) 2 MG/0.05ML SOLN by Intravitreal route. MONTHLY INJECTION for Retinal Vein Occlusion and Macular Occlusion   albuterol (PROVENTIL) (2.5 MG/3ML) 0.083% nebulizer solution Take 3 mLs (2.5 mg total) by nebulization every 4 (four) hours as needed for wheezing or shortness of breath. Dx  J45.909   albuterol (VENTOLIN HFA) 108 (90 Base) MCG/ACT inhaler Inhale 2 puffs  into the lungs every 6 (six) hours as needed for wheezing or shortness of breath.   allopurinol (ZYLOPRIM) 100 MG tablet Take 1 tablet (100 mg total) by mouth daily.   celecoxib (CELEBREX) 200 MG capsule Take 1 capsule (200 mg total) by mouth 2 (two) times daily.   cetirizine (ZYRTEC) 10 MG tablet Take 1 tablet by mouth once daily   Cholecalciferol (VITAMIN D3) 125 MCG (5000 UT) CAPS Take 1 capsule (5,000 Units total) by mouth daily.   colchicine 0.6 MG tablet Take two tablets prn gout attack.Then take another one in 1-2 hrs. Do not repeat for 3 days.   famotidine (PEPCID) 40 MG tablet Take 1 tablet by mouth once daily   fish oil-omega-3 fatty acids  1000 MG capsule Take 2 g by mouth every morning.   furosemide (LASIX) 20 MG tablet TAKE 1 TABLET DAILY   glucosamine-chondroitin 500-400 MG tablet Take 1 tablet by mouth every morning.   nortriptyline (PAMELOR) 25 MG capsule TAKE 1 CAPSULE AT BEDTIME   predniSONE (DELTASONE) 5 MG tablet TAKE 1 TABLET DAILY WITH BREAKFAST   pregabalin (LYRICA) 75 MG capsule Take 1 capsule (75 mg total) by mouth 3 (three) times daily as needed.   triamcinolone cream (KENALOG) 0.1 % Apply 1 Application topically 3 (three) times daily.   warfarin (COUMADIN) 3 MG tablet TAKE 1 TABLET DAILY OR TAKE AS DIRECTED BY ANTICOAGULATION CLINIC   warfarin (COUMADIN) 6 MG tablet TAKE 1 TABLET ON MONDAY, WEDNESDAY AND FRIDAY OR AS DIRECTED BY ANTICOAGULATION CLINIC   No current facility-administered medications on file prior to visit.     Allergies:   Cat hair extract, Diltiazem, Sulfa antibiotics, Amiodarone, Anoro ellipta [umeclidinium-vilanterol], Breo ellipta [fluticasone furoate-vilanterol], Exalgo [hydromorphone hcl], Fentanyl, Gabapentin, Hydrocodone-acetaminophen, Lopressor [metoprolol tartrate], Penicillins, Sulfonamide derivatives, and Tylenol [acetaminophen]   Social History   Tobacco Use   Smoking status: Never   Smokeless tobacco: Never  Vaping Use   Vaping Use:  Never used  Substance Use Topics   Alcohol use: No   Drug use: No    Family History: The patient's family history includes Heart disease in his mother and sister; Hyperlipidemia in an other family member; Hypertension in an other family member; Parkinsonism in an other family member. son passed away with hard heartbeats. Mother had 5V CABG about 10 years ago, had to have a pacemaker placed.  ROS:   Please see the history of present illness.   (+) Shortness of breath (+) BLE edema (+) Numbness of bilateral hands Additional pertinent ROS included in HPI.  EKGs/Labs/Other Studies Reviewed:    The following studies were reviewed today:  Echo 03/24/20 1. Left ventricular ejection fraction, by estimation, is 55 to 60%. The  left ventricle has normal function. The left ventricle has no regional  wall motion abnormalities. Left ventricular diastolic parameters are  indeterminate.   2. Right ventricular systolic function is normal. The right ventricular  size is normal.   3. Left atrial size was mild to moderately dilated.   4. The mitral valve is normal in structure. No evidence of mitral valve  regurgitation. No evidence of mitral stenosis.   5. The aortic valve has an indeterminant number of cusps. There is  moderate calcification of the aortic valve. There is moderate thickening  of the aortic valve. Aortic valve regurgitation is not visualized. Mild to  moderate aortic valve stenosis.Aortic  valve mean gradient measures 19.3 mmHg. Aortic valve peak gradient  measures 32.1 mmHg. Aortic valve area, by VTI measures 1.26 cm.   Echo 05/06/18 - Left ventricle: The cavity size was normal. Systolic function was   normal. The estimated ejection fraction was in the range of 60%   to 65%. Wall motion was normal; there were no regional wall   motion abnormalities. - Aortic valve: There was very mild stenosis. Valve area (VTI):   1.67 cm^2. Valve area (Vmax): 1.85 cm^2. Valve area (Vmean):  1.59   cm^2. - Mitral valve: There was mild regurgitation. - Left atrium: The atrium was mildly to moderately dilated.   Impressions:  - Left ventricular systolic function is preserved visually   estimated ejection fraction 60 to 65%.   Mild mitral regurgitation.   Mild to moderate left atrial enlargement  Very mild aortic stenosis.  Monitor 05/27/18 Patient had a min HR of 53 bpm, max HR of 214 bpm, and avg HR of 89 bpm. Predominant underlying rhythm was Sinus Rhythm. There were 9 patient triggered events, and Supraventricular Tachycardia was detected within +/- 45 seconds of symptomatic patient event(s).    Monitor noted 6 episodes of wide complex tachycardia; these are labeled as VT but are different morphology from the majority of his PVCs and bigeminy/trigeminy. Could be VT vs. SVT with aberrancy, given that these events occurred at a faster heart rate than his predominant SVT. The fastest WCT lasted 15 beats with a max rate of 197 bpm (avg 164 bpm); the run with the fastest interval was also the longest.    4752 narrow complex Supraventricular Tachycardia runs occurred, the run with the fastest interval lasting 1 min 37 secs with a max rate of 214 bpm, the longest lasting 56 mins 49 secs with an avg rate of 126 bpm. Some episodes of Supraventricular Tachycardia conducted with possible aberrancy. Isolated SVEs were frequent (10.9%, [id number]), SVE Couplets were rare (<1.0%, 7844), and SVE Triplets were rare (<1.0%, 793). Isolated VEs were occasional (2.1%, [id number]), VE Couplets were rare (<1.0%, 338), and VE Triplets were rare (<1.0%, 5). Ventricular Bigeminy and Trigeminy were present.  Echo stress 03/27/11 - Stress ECG conclusions: The stress ECG was normal. - Staged echo: There was no echocardiographic evidence for   stress-induced ischemia. Impressions:  - Stress echo with chest tightness but no ST changes; no   stress-induced wall motion abnormalities  EKG:  ECG personally reviewed.   10/23/2022:  afib rvr at 104 bpm 07/11/2020:  atrial fibrillation at 89 bpm  Recent Labs: 03/17/2022: Hemoglobin 15.8; Platelets 234.0; TSH 4.49 10/16/2022: ALT 19; BUN 15; Creatinine, Ser 1.14; Potassium 4.7; Sodium 138   Recent Lipid Panel    Component Value Date/Time   CHOL 191 03/17/2022 0927   TRIG 168.0 (H) 03/17/2022 0927   TRIG 118 04/30/2006 0841   HDL 36.20 (L) 03/17/2022 0927   CHOLHDL 5 03/17/2022 0927   VLDL 33.6 03/17/2022 0927   LDLCALC 121 (H) 03/17/2022 0927   LDLDIRECT 137.0 03/10/2017 0842    Physical Exam:    VS:  BP 138/72   Pulse (!) 104   Ht 5\' 11"  (1.803 m)   Wt (!) 317 lb (143.8 kg)   SpO2 94%   BMI 44.21 kg/m     Wt Readings from Last 3 Encounters:  10/23/22 (!) 317 lb (143.8 kg)  10/16/22 (!) 317 lb (143.8 kg)  07/21/22 (!) 315 lb (142.9 kg)    GEN: Well nourished, well developed in no acute distress HEENT: Normal, moist mucous membranes NECK: No JVD CARDIAC: irregularly irregular rhythm, normal S1 and S2, no rubs or gallops. 2/6 SEM (distant) VASCULAR: Radial and DP pulses 2+ bilaterally. No carotid bruits RESPIRATORY:  Clear to auscultation without rales, wheezing or rhonchi  ABDOMEN: Soft, non-tender, non-distended MUSCULOSKELETAL:  Ambulates independently SKIN: Warm and dry, bilateral trace LE edema, compression stockings in place NEUROLOGIC:  Alert and oriented x 3. No focal neuro deficits noted. PSYCHIATRIC:  Normal affect   ASSESSMENT:    1. Dyspnea on exertion   2. Nonrheumatic aortic valve stenosis   3. Permanent atrial fibrillation (HCC)   4. Secondary hypercoagulable state (HCC)   5. Long term current use of anticoagulant   6. Bilateral leg edema     PLAN:    Dyspnea on exertion -distant heart sounds, but murmur may be  slightly louder -recheck echo, concern for progression of AS -also concern for CAD/anginal equivalent. Given body habitus, PET best option  Informed Consent   Shared Decision Making/Informed  Consent The risks [chest pain, shortness of breath, cardiac arrhythmias, dizziness, blood pressure fluctuations, myocardial infarction, stroke/transient ischemic attack, nausea, vomiting, allergic reaction, radiation exposure, metallic taste sensation and life-threatening complications (estimated to be 1 in 10,000)], benefits (risk stratification, diagnosing coronary artery disease, treatment guidance) and alternatives of a cardiac PET stress test were discussed in detail with Mr. Bensman and he agrees to proceed.      Persistent atrial fibrillation -began during his Covid illness -given limitations/sensitivities to medications, limited options. Tolerated amiodarone as a rate control agent. This is not ideal long term give risk of lung toxicity and his lungs damaged from Covid, but cannot tolerate diltiazem or beta blockers. -CHA2DS2/VAS Stroke Risk Points=4  -on long term coumadin -discussed potential for cardioversion, but he feels like it is unlikely to hold given his continued symptoms/illness. He would like six more months to see if he can get closer to his prior baseline, then would consider discussion of cardioversion.  Hypertension:  -medications changed after Covid hospitalization, currently on furosemide alone  History of DVT/PE with chronic venous insufficiency, on long term anticoagulation with coumadin:  -No changes, not interested in DOAC, continue coumadin therapy.  -Uses compression stocking for insufficiency  Dyslipidemia: We have discussed at length options for management. He prefers to stay on OTC fish oil. Has significantly elevated ASCVD risk, below. Discussed that OTC fish oil not recommended from a cardiovascular perspective.  Obesity: BMI ~44. Aware that weight loss would be beneficial, but he feels very limited in his ability to do this due to his chronic medical conditions.  CV risk counseling and prevention -recommend heart healthy/Mediterranean diet, with whole  grains, fruits, vegetable, fish, lean meats, nuts, and olive oil. Limit salt. -recommend moderate walking, 3-5 times/week for 30-50 minutes each session. Aim for at least 150 minutes.week. Goal should be pace of 3 miles/hours, or walking 1.5 miles in 30 minutes -recommend avoidance of tobacco products. Avoid excess alcohol. -ASCVD risk score: The 10-year ASCVD risk score (Arnett DK, et al., 2019) is: 27.8%   Values used to calculate the score:     Age: 81 years     Sex: Male     Is Non-Hispanic African American: No     Diabetic: No     Tobacco smoker: No     Systolic Blood Pressure: 138 mmHg     Is BP treated: Yes     HDL Cholesterol: 36.2 mg/dL     Total Cholesterol: 191 mg/dL   Plan for follow up: 2 months, or sooner as needed.  Medication Adjustments/Labs and Tests Ordered: Current medicines are reviewed at length with the patient today.  Concerns regarding medicines are outlined above.   Orders Placed This Encounter  Procedures   NM PET CT CARDIAC PERFUSION MULTI W/ABSOLUTE BLOODFLOW   EKG 12-Lead   ECHOCARDIOGRAM COMPLETE   No orders of the defined types were placed in this encounter.  Patient Instructions  Medication Instructions:  Your physician recommends that you continue on your current medications as directed. Please refer to the Current Medication list given to you today.  Testing/Procedures: Your physician has requested that you have an echocardiogram. Echocardiography is a painless test that uses sound waves to create images of your heart. It provides your doctor with information about the size and shape of your heart and how well  your heart's chambers and valves are working. This procedure takes approximately one hour. There are no restrictions for this procedure. Please do NOT wear cologne, perfume, aftershave, or lotions (deodorant is allowed). Please arrive 15 minutes prior to your appointment time.   How to Prepare for Your Cardiac PET/CT Stress Test:  1.  Please do not take these medications before your test:   Medications that may interfere with the cardiac pharmacological stress agent (ex. nitrates - including erectile dysfunction medications, isosorbide mononitrate, tamulosin or beta-blockers) the day of the exam. (Erectile dysfunction medication should be held for at least 72 hrs prior to test) Theophylline containing medications for 12 hours. Dipyridamole 48 hours prior to the test. Your remaining medications may be taken with water.  2. Nothing to eat or drink, except water, 3 hours prior to arrival time.   NO caffeine/decaffeinated products, or chocolate 12 hours prior to arrival.  3. NO perfume, cologne or lotion  4. Total time is 1 to 2 hours; you may want to bring reading material for the waiting time.  5. Please report to Radiology at the [facility] Main Entrance 30 minutes early for your test.  [address] [place], [place] [address]  Diabetic Preparation:  Hold oral medications. You may take NPH and Lantus insulin. Do not take Humalog or Humulin R (Regular Insulin) the day of your test. Check blood sugars prior to leaving the house. If able to eat breakfast prior to 3 hour fasting, you may take all medications, including your insulin, Do not worry if you miss your breakfast dose of insulin - start at your next meal.  IF YOU THINK YOU MAY BE PREGNANT, OR ARE NURSING PLEASE INFORM THE TECHNOLOGIST.  In preparation for your appointment, medication and supplies will be purchased.  Appointment availability is limited, so if you need to cancel or reschedule, please call the Radiology Department at [phone]  24 hours in advance to avoid a cancellation fee of $100.00  What to Expect After you Arrive:  Once you arrive and check in for your appointment, you will be taken to a preparation room within the Radiology Department.  A technologist or Nurse will obtain your medical history, verify that you are correctly  prepped for the exam, and explain the procedure.  Afterwards,  an IV will be started in your arm and electrodes will be placed on your skin for EKG monitoring during the stress portion of the exam. Then you will be escorted to the PET/CT scanner.  There, staff will get you positioned on the scanner and obtain a blood pressure and EKG.  During the exam, you will continue to be connected to the EKG and blood pressure machines.  A small, safe amount of a radioactive tracer will be injected in your IV to obtain a series of pictures of your heart along with an injection of a stress agent.    After your Exam:  It is recommended that you eat a meal and drink a caffeinated beverage to counter act any effects of the stress agent.  Drink plenty of fluids for the remainder of the day and urinate frequently for the first couple of hours after the exam.  Your doctor will inform you of your test results within 7-10 business days.  For questions about your test or how to prepare for your test, please call: Rockwell Alexandria, Cardiac Imaging Nurse Navigator  Larey Brick, Cardiac Imaging Nurse Navigator Office: [phone]  Follow-Up: At [facility], you  and your health needs are our priority.  As part of our continuing mission to provide you with exceptional heart care, we have created designated Provider Care Teams.  These Care Teams include your primary Cardiologist (physician) and Advanced Practice Providers (APPs -  Physician Assistants and Nurse Practitioners) who all work together to provide you with the care you need, when you need it.  We recommend signing up for the patient portal called "MyChart".  Sign up information is provided on this After Visit Summary.  MyChart is used to connect with patients for Virtual Visits (Telemedicine).  Patients are able to view lab/test results, encounter notes, upcoming appointments, etc.  Non-urgent messages can be sent to your provider as well.   To learn more  about what you can do with MyChart, go to [email or web address].    Your next appointment:   2 months with Dr. Hansel Feinstein Stumpf,acting as a scribe for Jodelle Red, MD.,have documented all relevant documentation on the behalf of Jodelle Red, MD,as directed by  Jodelle Red, MD while in the presence of Jodelle Red, MD.  I, Jodelle Red, MD, have reviewed all documentation for this visit. The documentation on 12/11/22 for the exam, diagnosis, procedures, and orders are all accurate and complete.   Signed, Jodelle Red, MD PhD 10/23/2022    [facility]

## 2022-10-23 NOTE — Patient Instructions (Addendum)
Medication Instructions:  Your physician recommends that you continue on your current medications as directed. Please refer to the Current Medication list given to you today.  Testing/Procedures: Your physician has requested that you have an echocardiogram. Echocardiography is a painless test that uses sound waves to create images of your heart. It provides your doctor with information about the size and shape of your heart and how well your heart's chambers and valves are working. This procedure takes approximately one hour. There are no restrictions for this procedure. Please do NOT wear cologne, perfume, aftershave, or lotions (deodorant is allowed). Please arrive 15 minutes prior to your appointment time.   How to Prepare for Your Cardiac PET/CT Stress Test:  1. Please do not take these medications before your test:   Medications that may interfere with the cardiac pharmacological stress agent (ex. nitrates - including erectile dysfunction medications, isosorbide mononitrate, tamulosin or beta-blockers) the day of the exam. (Erectile dysfunction medication should be held for at least 72 hrs prior to test) Theophylline containing medications for 12 hours. Dipyridamole 48 hours prior to the test. Your remaining medications may be taken with water.  2. Nothing to eat or drink, except water, 3 hours prior to arrival time.   NO caffeine/decaffeinated products, or chocolate 12 hours prior to arrival.  3. NO perfume, cologne or lotion  4. Total time is 1 to 2 hours; you may want to bring reading material for the waiting time.  5. Please report to Radiology at the [facility] Main Entrance 30 minutes early for your test.  [address] [place], [place] [address]  Diabetic Preparation:  Hold oral medications. You may take NPH and Lantus insulin. Do not take Humalog or Humulin R (Regular Insulin) the day of your test. Check blood sugars prior to leaving the house. If able to  eat breakfast prior to 3 hour fasting, you may take all medications, including your insulin, Do not worry if you miss your breakfast dose of insulin - start at your next meal.  IF YOU THINK YOU MAY BE PREGNANT, OR ARE NURSING PLEASE INFORM THE TECHNOLOGIST.  In preparation for your appointment, medication and supplies will be purchased.  Appointment availability is limited, so if you need to cancel or reschedule, please call the Radiology Department at [phone]  24 hours in advance to avoid a cancellation fee of $100.00  What to Expect After you Arrive:  Once you arrive and check in for your appointment, you will be taken to a preparation room within the Radiology Department.  A technologist or Nurse will obtain your medical history, verify that you are correctly prepped for the exam, and explain the procedure.  Afterwards,  an IV will be started in your arm and electrodes will be placed on your skin for EKG monitoring during the stress portion of the exam. Then you will be escorted to the PET/CT scanner.  There, staff will get you positioned on the scanner and obtain a blood pressure and EKG.  During the exam, you will continue to be connected to the EKG and blood pressure machines.  A small, safe amount of a radioactive tracer will be injected in your IV to obtain a series of pictures of your heart along with an injection of a stress agent.    After your Exam:  It is recommended that you eat a meal and drink a caffeinated beverage to counter act any effects of the stress agent.  Drink plenty of fluids for  the remainder of the day and urinate frequently for the first couple of hours after the exam.  Your doctor will inform you of your test results within 7-10 business days.  For questions about your test or how to prepare for your test, please call: Rockwell Alexandria, Cardiac Imaging Nurse Navigator  Larey Brick, Cardiac Imaging Nurse Navigator Office: [phone]  Follow-Up: At [facility], you and your health needs are our priority.  As part of our continuing mission to provide you with exceptional heart care, we have created designated Provider Care Teams.  These Care Teams include your primary Cardiologist (physician) and Advanced Practice Providers (APPs -  Physician Assistants and Nurse Practitioners) who all work together to provide you with the care you need, when you need it.  We recommend signing up for the patient portal called "MyChart".  Sign up information is provided on this After Visit Summary.  MyChart is used to connect with patients for Virtual Visits (Telemedicine).  Patients are able to view lab/test results, encounter notes, upcoming appointments, etc.  Non-urgent messages can be sent to your provider as well.   To learn more about what you can do with MyChart, go to [email or web address].    Your next appointment:   2 months with Dr. Cristal Deer

## 2022-10-24 DIAGNOSIS — H34831 Tributary (branch) retinal vein occlusion, right eye, with macular edema: Secondary | ICD-10-CM | POA: Diagnosis not present

## 2022-10-28 ENCOUNTER — Ambulatory Visit (INDEPENDENT_AMBULATORY_CARE_PROVIDER_SITE_OTHER): Payer: MEDICARE

## 2022-10-28 DIAGNOSIS — Z7901 Long term (current) use of anticoagulants: Secondary | ICD-10-CM

## 2022-10-28 LAB — POCT INR: INR: 2.7 (ref 2.0–3.0)

## 2022-10-28 NOTE — Progress Notes (Addendum)
Continue 1/2 tablet daily except take 1 tablet on Fridays.  Recheck in 6 weeks.  Medical screening examination/treatment/procedure(s) were performed by non-physician practitioner and as supervising physician I was immediately available for consultation/collaboration.  I agree with above. Jacinta Shoe, MD

## 2022-10-28 NOTE — Patient Instructions (Addendum)
Pre visit review using our clinic review tool, if applicable. No additional management support is needed unless otherwise documented below in the visit note.  Continue 1/2 tablet daily except take 1 tablet on Fridays.  Recheck in 6 weeks. 

## 2022-10-31 DIAGNOSIS — H25812 Combined forms of age-related cataract, left eye: Secondary | ICD-10-CM | POA: Diagnosis not present

## 2022-11-13 ENCOUNTER — Ambulatory Visit (HOSPITAL_COMMUNITY): Payer: MEDICARE | Attending: Cardiology

## 2022-11-13 DIAGNOSIS — D6869 Other thrombophilia: Secondary | ICD-10-CM | POA: Insufficient documentation

## 2022-11-13 DIAGNOSIS — I4821 Permanent atrial fibrillation: Secondary | ICD-10-CM | POA: Diagnosis not present

## 2022-11-13 DIAGNOSIS — Z7901 Long term (current) use of anticoagulants: Secondary | ICD-10-CM | POA: Insufficient documentation

## 2022-11-13 DIAGNOSIS — R600 Localized edema: Secondary | ICD-10-CM | POA: Diagnosis not present

## 2022-11-13 DIAGNOSIS — R0609 Other forms of dyspnea: Secondary | ICD-10-CM | POA: Insufficient documentation

## 2022-11-13 DIAGNOSIS — I350 Nonrheumatic aortic (valve) stenosis: Secondary | ICD-10-CM

## 2022-11-13 LAB — ECHOCARDIOGRAM COMPLETE
AR max vel: 0.75 cm2
AV Area VTI: 0.72 cm2
AV Area mean vel: 0.65 cm2
AV Mean grad: 40 mmHg
AV Peak grad: 58.4 mmHg
Ao pk vel: 3.82 m/s
Area-P 1/2: 4.01 cm2
S' Lateral: 4 cm

## 2022-11-14 ENCOUNTER — Other Ambulatory Visit: Payer: Self-pay | Admitting: Internal Medicine

## 2022-11-14 DIAGNOSIS — Z7901 Long term (current) use of anticoagulants: Secondary | ICD-10-CM

## 2022-11-14 NOTE — Telephone Encounter (Signed)
Pt is compliant with warfarin management and PCP apts.  Sent in refill of warfarin to requested pharmacy.      

## 2022-11-19 ENCOUNTER — Other Ambulatory Visit (HOSPITAL_BASED_OUTPATIENT_CLINIC_OR_DEPARTMENT_OTHER): Payer: MEDICARE

## 2022-11-19 ENCOUNTER — Telehealth: Payer: Self-pay | Admitting: Cardiology

## 2022-11-19 NOTE — Telephone Encounter (Signed)
Pt's spouse would like a callback to discuss ECHO results. Please advise

## 2022-11-21 ENCOUNTER — Telehealth (HOSPITAL_COMMUNITY): Payer: Self-pay | Admitting: Emergency Medicine

## 2022-11-24 ENCOUNTER — Telehealth (HOSPITAL_COMMUNITY): Payer: Self-pay | Admitting: Emergency Medicine

## 2022-11-24 ENCOUNTER — Telehealth (HOSPITAL_BASED_OUTPATIENT_CLINIC_OR_DEPARTMENT_OTHER): Payer: Self-pay

## 2022-11-24 NOTE — Telephone Encounter (Addendum)
Seen by patient Robert [name] on 11/23/2022  9:24 PM; follow up mychart message to patient.   ----- Message from Alver Sorrow, NP sent at 11/23/2022  9:12 PM EDT ----- Aortic valve with severe calcification and stenosis. Normal heart pumping function.  Proceed with cardiac PET as scheduled.   If he is having new dyspnea, chest pain, syncope please move up his appointment with Dr. Cristal Deer. Would recommend it be after his PET and with Dr. Cristal Deer only.

## 2022-11-24 NOTE — Telephone Encounter (Signed)
Reaching out to patient to offer assistance regarding upcoming cardiac imaging study; pt verbalizes understanding of appt date/time, parking situation and where to check in, pre-test NPO status and medications ordered, and verified current allergies; name and call back number provided for further questions should they arise Claude Waldman RN Navigator Cardiac Imaging [facility] [phone] office [phone] cell 

## 2022-11-24 NOTE — Telephone Encounter (Signed)
Attempted to call patient regarding upcoming cardiac PET appointment. Left message on voicemail with name and callback number Joretta Eads RN Navigator Cardiac Imaging [facility] [phone] Office [phone] Cell  

## 2022-11-26 ENCOUNTER — Ambulatory Visit (HOSPITAL_COMMUNITY)
Admission: RE | Admit: 2022-11-26 | Discharge: 2022-11-26 | Disposition: A | Discharge status: Home / Self Care | Payer: MEDICARE | Source: Ambulatory Visit | Attending: Cardiology | Admitting: Cardiology

## 2022-11-26 ENCOUNTER — Telehealth: Payer: Self-pay

## 2022-11-26 DIAGNOSIS — Z7901 Long term (current) use of anticoagulants: Secondary | ICD-10-CM

## 2022-11-26 DIAGNOSIS — R918 Other nonspecific abnormal finding of lung field: Secondary | ICD-10-CM | POA: Diagnosis not present

## 2022-11-26 DIAGNOSIS — R0609 Other forms of dyspnea: Secondary | ICD-10-CM | POA: Diagnosis not present

## 2022-11-26 DIAGNOSIS — D6869 Other thrombophilia: Secondary | ICD-10-CM | POA: Diagnosis not present

## 2022-11-26 DIAGNOSIS — R600 Localized edema: Secondary | ICD-10-CM | POA: Insufficient documentation

## 2022-11-26 DIAGNOSIS — I350 Nonrheumatic aortic (valve) stenosis: Secondary | ICD-10-CM | POA: Diagnosis not present

## 2022-11-26 DIAGNOSIS — I4821 Permanent atrial fibrillation: Secondary | ICD-10-CM | POA: Insufficient documentation

## 2022-11-26 LAB — NM PET CT CARDIAC PERFUSION MULTI W/ABSOLUTE BLOODFLOW
Base ST Depression (mm): 0 mm
LV dias vol: 129 mL (ref 62–150)
LV sys vol: 77 mL
MBFR: 1.92
Nuc Rest EF: 40 %
Nuc Stress EF: 33 %
Rest MBF: 0.72 ml/g/min
Rest Nuclear Isotope Dose: 28.4 mCi
Rest perfusion cavity size (mL): 77 mL
ST Depression (mm): 0 mm
Stress MBF: 1.38 ml/g/min
Stress Nuclear Isotope Dose: 28.3 mCi
Stress perfusion cavity size (mL): 97 mL
Stress/rest perfusion ratio: 1.3

## 2022-11-26 MED ORDER — RUBIDIUM RB82 GENERATOR (RUBYFILL)
28.3000 | PACK | Freq: Once | INTRAVENOUS | Status: AC
  Administered 2022-11-26: 28.3 via INTRAVENOUS

## 2022-11-26 MED ORDER — RUBIDIUM RB82 GENERATOR (RUBYFILL)
28.4000 | PACK | Freq: Once | INTRAVENOUS | Status: AC
  Administered 2022-11-26: 28.4 via INTRAVENOUS

## 2022-11-26 MED ORDER — REGADENOSON 0.4 MG/5ML IV SOLN
INTRAVENOUS | Status: AC
  Filled 2022-11-26: qty 5

## 2022-11-26 MED ORDER — REGADENOSON 0.4 MG/5ML IV SOLN
0.4000 mg | Freq: Once | INTRAVENOUS | Status: AC
  Administered 2022-11-26: 0.4 mg via INTRAVENOUS

## 2022-11-26 NOTE — Telephone Encounter (Signed)
Left voicemail to return call to office.

## 2022-11-26 NOTE — Telephone Encounter (Signed)
-----   Message from Jodelle Red, MD sent at 11/26/2022  1:28 PM EDT ----- Overall the stress test showed there is no areas of the heart with decreased blood flow. It did show significant vessel calcification. This study noted that the pump function of the heart is reduced, but recent echo showed that it is normal. With atrial fibrillation, the echo is more reliable than the stress test for pump function. Overall suggests it is not progression of blockages that is causing symptoms.

## 2022-11-27 ENCOUNTER — Telehealth (HOSPITAL_BASED_OUTPATIENT_CLINIC_OR_DEPARTMENT_OTHER): Payer: Self-pay | Admitting: Cardiology

## 2022-11-27 NOTE — Telephone Encounter (Signed)
  Pt is calling to get pet scan result. He said he saw it on mychart but would like to speak with a nurse to discuss it more

## 2022-11-27 NOTE — Telephone Encounter (Signed)
Returned call to patient,   Patient states he was just returning call to confirm that Dr. Cristal Deer will review everything with him at his appointment on Tuesday! Confirmed that she will.     "----- Message from Jodelle Red, MD sent at 11/26/2022  1:28 PM EDT ----- Overall the stress test showed there is no areas of the heart with decreased blood flow. It did show significant vessel calcification. This study noted that the pump function of the heart is reduced, but recent echo showed that it is normal. With atrial fibrillation, the echo is more reliable than the stress test for pump function. Overall suggests it is not progression of blockages that is causing symptoms"

## 2022-12-02 ENCOUNTER — Encounter (HOSPITAL_BASED_OUTPATIENT_CLINIC_OR_DEPARTMENT_OTHER): Payer: Self-pay | Admitting: Cardiology

## 2022-12-02 ENCOUNTER — Ambulatory Visit (INDEPENDENT_AMBULATORY_CARE_PROVIDER_SITE_OTHER): Payer: MEDICARE | Admitting: Cardiology

## 2022-12-02 VITALS — BP 128/73 | HR 98 | Ht 71.0 in | Wt 321.1 lb

## 2022-12-02 DIAGNOSIS — I4821 Permanent atrial fibrillation: Secondary | ICD-10-CM

## 2022-12-02 DIAGNOSIS — R600 Localized edema: Secondary | ICD-10-CM

## 2022-12-02 DIAGNOSIS — Z7901 Long term (current) use of anticoagulants: Secondary | ICD-10-CM | POA: Diagnosis not present

## 2022-12-02 DIAGNOSIS — D6869 Other thrombophilia: Secondary | ICD-10-CM

## 2022-12-02 DIAGNOSIS — I350 Nonrheumatic aortic (valve) stenosis: Secondary | ICD-10-CM | POA: Diagnosis not present

## 2022-12-02 DIAGNOSIS — R0609 Other forms of dyspnea: Secondary | ICD-10-CM | POA: Diagnosis not present

## 2022-12-02 NOTE — Progress Notes (Signed)
Cardiology Office Note:    Date:  12/02/2022   ID:  Robert [name], DOB August 02, 1950, MRN [id number]  PCP:  Tresa Garter, MD  Cardiologist:  Jodelle Red, MD  Referring MD: Tresa Garter, MD   CC:  Follow-up  History of Present Illness:    Robert [name] is a 72 y.o. male with a hx of severe Covid infection requiring prolonged hospitalization, atrial fibrillation (permanent, started with Covid), prior DVT/PE, dyslipidemia, obesity, paroxysmal SVT who is seen in follow up.  He was previously seen by cardiology in 04/2014 by Dr. Eden Emms. His initial consult with me was on 04/23/18. Please see that note for full review of his history.  Today: Reviewed his echo and PET together today. Echo with severe aortic stenosis. PET without ischemia. Discussed that he will likely need a CT TAVR and cath for further evaluation before determining what he might be a candidate for. He is concerned about the possibility of needing open heart surgery and would much prefer TAVR is possible, but understands the need for workup.  Short of breath walking from one side of the house to the other. No chest pain, no syncope. Chronic LE edema is unchanged. Has chronic cough if he tries to lay flat, improved with inhaler..   Past Medical History:  Diagnosis Date   Anxiety    Asthma    "no attack since acupuncture in 1990's"   Complication of anesthesia    SLOW TO WAKE UP / DIFFICULTY RESPONDING 2003, 3 days before could use left arm, "wild/crazy sometimes"   Cyst of left kidney    "benign"   DVT (deep venous thrombosis) (HCC) 2003   right femoral artery "from groin to knee"   Edema    Right Leg w/ h/o DVT postphlebitic   H/O blood transfusion reaction 2004   FFP, extreme swelling, could not breath   Headache(784.0)    MIGRAINES-not for a long time   History of pulmonary embolism 2003   both lungs   Hyperlipidemia    Knee pain    left   LBP (low back pain)    Lung nodule     "from 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, clear in 2012"   Obesity    Osteoarthritis    Peripheral vascular disease (HCC)    "poor circulation in  RT leg"   PONV (postoperative nausea and vomiting)    during surgery in 1990's   Spondylolisthesis    Warfarin anticoagulation    Wheezing    when laying on left side    Past Surgical History:  Procedure Laterality Date   APPENDECTOMY  1993   Dr Jamey Ripa   CHOLECYSTECTOMY  1993   FINGER SURGERY  2012   RT THUMB RECONSTRUCTION   HAND SURGERY Right    abcess   HEMORROIDECTOMY  1981   I & D KNEE WITH POLY EXCHANGE Left 04/04/2013   Procedure: IRRIGATION AND DEBRIDEMENT KNEE WITH POLY EXCHANGE AND INSERTION OF CEMENT BEADS;  Surgeon: Loanne Drilling, MD;  Location: [facility];  Service: Orthopedics;  Laterality: Left;   KNEE ARTHROSCOPY  1990   RT KNEE   KNEE ARTHROSCOPY Right 2003   KNEE ARTHROSCOPY  1988   L KNEE   KNEE ARTHROSCOPY WITH MENISCAL REPAIR Left 10/22/2012   Procedure: LEFT KNEE ARTHROSCOPY PARTIAL MEDIAL AND LATERAL MENISCECTOMY AND DEBRIDEMENT, CHONDROPLASTY ;  Surgeon: Javier Docker, MD;  Location: [facility];  Service: Orthopedics;  Laterality: Left;   LUMBAR DISC SURGERY  LUMBAR FUSION  2003   Dr Jillyn Hidden   ORCHIECTOMY  1994   R post injury   TONSILLECTOMY  1967   TOTAL KNEE ARTHROPLASTY Left 03/21/2013   Procedure: LEFT TOTAL KNEE ARTHROPLASTY;  Surgeon: Loanne Drilling, MD;  Location: [facility];  Service: Orthopedics;  Laterality: Left;    Current Medications: Current Outpatient Medications on File Prior to Visit  Medication Sig   Aflibercept (EYLEA) 2 MG/0.05ML SOLN by Intravitreal route. MONTHLY INJECTION for Retinal Vein Occlusion and Macular Occlusion   albuterol (PROVENTIL) (2.5 MG/3ML) 0.083% nebulizer solution Take 3 mLs (2.5 mg total) by nebulization every 4 (four) hours as needed for wheezing or shortness of breath. Dx  J45.909   albuterol (VENTOLIN HFA) 108 (90 Base) MCG/ACT inhaler Inhale 2 puffs into the lungs every 6 (six)  hours as needed for wheezing or shortness of breath.   allopurinol (ZYLOPRIM) 100 MG tablet Take 1 tablet (100 mg total) by mouth daily.   celecoxib (CELEBREX) 200 MG capsule Take 1 capsule (200 mg total) by mouth 2 (two) times daily.   cetirizine (ZYRTEC) 10 MG tablet Take 1 tablet by mouth once daily   Cholecalciferol (VITAMIN D3) 125 MCG (5000 UT) CAPS Take 1 capsule (5,000 Units total) by mouth daily.   colchicine 0.6 MG tablet Take two tablets prn gout attack.Then take another one in 1-2 hrs. Do not repeat for 3 days.   famotidine (PEPCID) 40 MG tablet Take 1 tablet by mouth once daily   fish oil-omega-3 fatty acids 1000 MG capsule Take 2 g by mouth every morning.   furosemide (LASIX) 20 MG tablet TAKE 1 TABLET DAILY   glucosamine-chondroitin 500-400 MG tablet Take 1 tablet by mouth every morning.   nortriptyline (PAMELOR) 25 MG capsule TAKE 1 CAPSULE AT BEDTIME   predniSONE (DELTASONE) 5 MG tablet TAKE 1 TABLET DAILY WITH BREAKFAST   pregabalin (LYRICA) 75 MG capsule Take 1 capsule (75 mg total) by mouth 3 (three) times daily as needed.   triamcinolone cream (KENALOG) 0.1 % Apply 1 Application topically 3 (three) times daily.   warfarin (COUMADIN) 3 MG tablet TAKE 1 TABLET DAILY OR TAKE AS DIRECTED BY ANTICOAGULATION CLINIC   warfarin (COUMADIN) 6 MG tablet TAKE 1/2 TABLET BY MOUTH DAILY EXCEPT TAKE 1 TABLET ON FRIDAYS OR AS DIRECTED BY ANTICOAGULATION CLINIC   No current facility-administered medications on file prior to visit.     Allergies:   Cat hair extract, Diltiazem, Sulfa antibiotics, Amiodarone, Anoro ellipta [umeclidinium-vilanterol], Breo ellipta [fluticasone furoate-vilanterol], Exalgo [hydromorphone hcl], Fentanyl, Gabapentin, Hydrocodone-acetaminophen, Lopressor [metoprolol tartrate], Penicillins, Sulfonamide derivatives, and Tylenol [acetaminophen]   Social History   Tobacco Use   Smoking status: Never   Smokeless tobacco: Never  Vaping Use   Vaping Use: Never used   Substance Use Topics   Alcohol use: No   Drug use: No    Family History: The patient's family history includes Heart disease in his mother and sister; Hyperlipidemia in an other family member; Hypertension in an other family member; Parkinsonism in an other family member. son passed away with hard heartbeats. Mother had 5V CABG about 10 years ago, had to have a pacemaker placed.  ROS:   Please see the history of present illness.   (+) Shortness of breath (+) BLE edema Additional pertinent ROS included in HPI.  EKGs/Labs/Other Studies Reviewed:    The following studies were reviewed today: Pet 12/01/2022 Narrative & Impression      LV perfusion is normal. There is no evidence  of ischemia. There is no evidence of infarction.   Rest left ventricular function is abnormal. Rest global function is moderately reduced. There were no regional wall motion abnormalities. Rest EF: 40 %. Stress left ventricular function is abnormal. Stress global function is moderately reduced. There were no regional wall abnormalities. Stress EF: 33 %. End diastolic cavity size is mildly enlarged. End systolic cavity size is mildly enlarged.   Myocardial blood flow was computed to be 0.48ml/g/min at rest and 1.90ml/g/min at stress. Global myocardial blood flow reserve was 1.92 and was mildly abnormal.   Coronary calcium was present on the attenuation correction CT images. Severe coronary calcifications were present. Coronary calcifications were present in the left anterior descending artery and right coronary artery distribution(s).   Findings are equivocal. The study is intermediate risk.   Despite normal perfusion study considered intermediate to high risk. Resting EF abnormal and decreases with stress. TID 1.3 with abnormal MBF and severe calcium in LAD and RCA Baseline heart rhythm atrial fibrillation  IMPRESSION: 1. Upper limits of normal left posterior mediastinal lymph node measures 1.5 cm. 2. Decreased AP  diameter of the trachea and bilateral mainstem bronchi which may be seen with chronic tracheobronchomalacia. 3. Chronic postinflammatory changes noted within both lungs including subpleural banding and interstitial reticulation.  Echo 11/13/22 1. The aortic valve is tricuspid. There is severe calcifcation of the  aortic valve. There is severe thickening of the aortic valve. Aortic valve  regurgitation is mild. Severe aortic valve stenosis. Aortic valve area, by  VTI measures 0.72 cm. Aortic  valve mean gradient measures 40.0 mmHg. Aortic valve Vmax measures 3.82  m/s. DI 0.23.   2. Left ventricular ejection fraction, by estimation, is 55 to 60%. The  left ventricle has normal function. The left ventricle has no regional  wall motion abnormalities. There is mild concentric left ventricular  hypertrophy. Left ventricular diastolic  parameters are indeterminate.   3. Right ventricular systolic function is normal. The right ventricular  size is normal. There is normal pulmonary artery systolic pressure.   4. Left atrial size was moderately dilated.   5. Right atrial size was mildly dilated.   6. The mitral valve is grossly normal. Mild mitral valve regurgitation.   Comparison(s): Compared to prior TTE in 2021, the AS is now severe (prior  mean gradient 19.3, AVA 1.26cm2).   Echo 03/24/20 1. Left ventricular ejection fraction, by estimation, is 55 to 60%. The  left ventricle has normal function. The left ventricle has no regional  wall motion abnormalities. Left ventricular diastolic parameters are  indeterminate.   2. Right ventricular systolic function is normal. The right ventricular  size is normal.   3. Left atrial size was mild to moderately dilated.   4. The mitral valve is normal in structure. No evidence of mitral valve  regurgitation. No evidence of mitral stenosis.   5. The aortic valve has an indeterminant number of cusps. There is  moderate calcification of the aortic  valve. There is moderate thickening  of the aortic valve. Aortic valve regurgitation is not visualized. Mild to  moderate aortic valve stenosis.Aortic  valve mean gradient measures 19.3 mmHg. Aortic valve peak gradient  measures 32.1 mmHg. Aortic valve area, by VTI measures 1.26 cm.   Echo 05/06/18 - Left ventricle: The cavity size was normal. Systolic function was   normal. The estimated ejection fraction was in the range of 60%   to 65%. Wall motion was normal; there were no regional wall  motion abnormalities. - Aortic valve: There was very mild stenosis. Valve area (VTI):   1.67 cm^2. Valve area (Vmax): 1.85 cm^2. Valve area (Vmean): 1.59   cm^2. - Mitral valve: There was mild regurgitation. - Left atrium: The atrium was mildly to moderately dilated.   Impressions:  - Left ventricular systolic function is preserved visually   estimated ejection fraction 60 to 65%.   Mild mitral regurgitation.   Mild to moderate left atrial enlargement   Very mild aortic stenosis.  Monitor 05/27/18 Patient had a min HR of 53 bpm, max HR of 214 bpm, and avg HR of 89 bpm. Predominant underlying rhythm was Sinus Rhythm. There were 9 patient triggered events, and Supraventricular Tachycardia was detected within +/- 45 seconds of symptomatic patient event(s).    Monitor noted 6 episodes of wide complex tachycardia; these are labeled as VT but are different morphology from the majority of his PVCs and bigeminy/trigeminy. Could be VT vs. SVT with aberrancy, given that these events occurred at a faster heart rate than his predominant SVT. The fastest WCT lasted 15 beats with a max rate of 197 bpm (avg 164 bpm); the run with the fastest interval was also the longest.    4752 narrow complex Supraventricular Tachycardia runs occurred, the run with the fastest interval lasting 1 min 37 secs with a max rate of 214 bpm, the longest lasting 56 mins 49 secs with an avg rate of 126 bpm. Some episodes of  Supraventricular Tachycardia conducted with possible aberrancy. Isolated SVEs were frequent (10.9%, [id number]), SVE Couplets were rare (<1.0%, 7844), and SVE Triplets were rare (<1.0%, 793). Isolated VEs were occasional (2.1%, [id number]), VE Couplets were rare (<1.0%, 338), and VE Triplets were rare (<1.0%, 5). Ventricular Bigeminy and Trigeminy were present.  Echo stress 03/27/11 - Stress ECG conclusions: The stress ECG was normal. - Staged echo: There was no echocardiographic evidence for   stress-induced ischemia. Impressions:  - Stress echo with chest tightness but no ST changes; no   stress-induced wall motion abnormalities  EKG:  ECG personally reviewed.  07/11/2020:  atrial fibrillation at 89 bpm  Recent Labs: 03/17/2022: Hemoglobin 15.8; Platelets 234.0; TSH 4.49 10/16/2022: ALT 19; BUN 15; Creatinine, Ser 1.14; Potassium 4.7; Sodium 138   Recent Lipid Panel    Component Value Date/Time   CHOL 191 03/17/2022 0927   TRIG 168.0 (H) 03/17/2022 0927   TRIG 118 04/30/2006 0841   HDL 36.20 (L) 03/17/2022 0927   CHOLHDL 5 03/17/2022 0927   VLDL 33.6 03/17/2022 0927   LDLCALC 121 (H) 03/17/2022 0927   LDLDIRECT 137.0 03/10/2017 0842    Physical Exam:    VS:  BP 128/73 (BP Location: Left Arm, Patient Position: Sitting, Cuff Size: Large)   Pulse 98   Ht 5\' 11"  (1.803 m)   Wt (!) 321 lb 1.6 oz (145.7 kg)   SpO2 96%   BMI 44.78 kg/m     Wt Readings from Last 3 Encounters:  12/02/22 (!) 321 lb 1.6 oz (145.7 kg)  10/23/22 (!) 317 lb (143.8 kg)  10/16/22 (!) 317 lb (143.8 kg)    GEN: Well nourished, well developed in no acute distress HEENT: Normal, moist mucous membranes NECK: No JVD CARDIAC: irregularly irregular rhythm, normal S1 and S2, no rubs or gallops. Distant 2/6 SEM, harsh VASCULAR: Radial and DP pulses 2+ bilaterally. No carotid bruits RESPIRATORY:  Clear to auscultation without rales, wheezing or rhonchi  ABDOMEN: Soft, non-tender, non-distended MUSCULOSKELETAL:   Ambulates independently SKIN: Warm and  dry, bilateral trace LE edema, compression stockings in place NEUROLOGIC:  Alert and oriented x 3. No focal neuro deficits noted. PSYCHIATRIC:  Normal affect   ASSESSMENT:    1. Nonrheumatic aortic valve stenosis   2. Dyspnea on exertion   3. Permanent atrial fibrillation (HCC)   4. Secondary hypercoagulable state (HCC)   5. Long term current use of anticoagulant   6. Bilateral leg edema    PLAN:    Severe aortic stenosis Severe shortness of breath -reviewed tests at length today. Difficult to appreciate murmur due to body habitus.  -has significant underlying lung disease, tracheobronchomalacia as well -PET without ischemia. Known severe calcifications. Suspect EF on PET is not accurate given echo done recently/chronic afib -we discussed structural heart evaluation at length. He feels that he is not a good candidate for open heart surgery, and I agree. With his underlying lung issues I worry about going on pump, long term anesthesia, and ventilator dependence. He does have a DNR, which would be suspended for any procedures -discussed that with his know peripheral vascular disease, he would need a CT TAVR to determine access. He would also likely need a cath, although he had no ischemia on PET and has a normal EF, discussed that this is standard part of workup.  Permanent atrial fibrillation -began during his Covid illness -given limitations/sensitivities to medications, limited options. Tolerated amiodarone as a rate control agent. This is not ideal long term give risk of lung toxicity and his lungs damaged from Covid, but cannot tolerate diltiazem or beta blockers. -CHA2DS2/VAS Stroke Risk Points=4  -on long term coumadin -we have discussion cardioversion in the past, declined  Hypertension:  -at goal today -medications changed after Covid hospitalization, currently on furosemide alone  History of DVT/PE with chronic venous insufficiency, on  long term anticoagulation with coumadin:  -No changes, not interested in DOAC, continue coumadin therapy.  -Uses compression stocking for insufficiency  Dyslipidemia Coronary calcification consistent with CAD Peripheral vascular disease -We have discussed at length options for management. Declines statin and other medications. He prefers to stay on OTC fish oil. Has significantly elevated ASCVD risk, below. Discussed that OTC fish oil not recommended from a cardiovascular perspective.  Obesity: BMI ~44. Aware that weight loss would be beneficial, but he feels very limited in his ability to do this due to his chronic medical conditions.  CV risk counseling and prevention -recommend heart healthy/Mediterranean diet, with whole grains, fruits, vegetable, fish, lean meats, nuts, and olive oil. Limit salt. -recommend moderate walking, 3-5 times/week for 30-50 minutes each session. Aim for at least 150 minutes.week. Goal should be pace of 3 miles/hours, or walking 1.5 miles in 30 minutes -recommend avoidance of tobacco products. Avoid excess alcohol. -ASCVD risk score: The 10-year ASCVD risk score (Arnett DK, et al., 2019) is: 24.8%   Values used to calculate the score:     Age: 83 years     Sex: Male     Is Non-Hispanic African American: No     Diabetic: No     Tobacco smoker: No     Systolic Blood Pressure: 128 mmHg     Is BP treated: Yes     HDL Cholesterol: 36.2 mg/dL     Total Cholesterol: 191 mg/dL   Plan for follow up: Refer to structural heart. Follow up 3 months, or sooner as needed.  Total time of encounter: 46 minutes total time of encounter, including 31 minutes spent in face-to-face patient care. This time includes coordination  of care and counseling regarding above conditions. Remainder of non-face-to-face time involved reviewing chart documents/testing relevant to the patient encounter and documentation in the medical record.  Jodelle Red, MD, PhD, [facility]  [facility]    Signed, Jodelle Red, MD PhD 12/02/2022    [facility]

## 2022-12-02 NOTE — Patient Instructions (Addendum)
Medication Instructions:  Your physician recommends that you continue on your current medications as directed. Please refer to the Current Medication list given to you today.  *If you need a refill on your cardiac medications before your next appointment, please call your pharmacy*  Lab Work: NONE  Testing/Procedures: NONE  Follow-Up: You have been referred to [facility]UCT[facility] HEART TE[facility] THEY WILL CALL YOU WITH APPOINTMENT DATE AND TIME  03/06/2023 9:20 am with Dr Cristal Deer

## 2022-12-09 ENCOUNTER — Ambulatory Visit (INDEPENDENT_AMBULATORY_CARE_PROVIDER_SITE_OTHER): Payer: MEDICARE

## 2022-12-09 DIAGNOSIS — Z7901 Long term (current) use of anticoagulants: Secondary | ICD-10-CM | POA: Diagnosis not present

## 2022-12-09 LAB — POCT INR: INR: 2.5 (ref 2.0–3.0)

## 2022-12-09 NOTE — Patient Instructions (Addendum)
Pre visit review using our clinic review tool, if applicable. No additional management support is needed unless otherwise documented below in the visit note.  Continue 1/2 tablet daily except take 1 tablet on Fridays.  Recheck in 6 weeks. 

## 2022-12-09 NOTE — Progress Notes (Cosign Needed Addendum)
Continue 1/2 tablet daily except take 1 tablet on Fridays.  Recheck in 6 weeks.  Medical screening examination/treatment/procedure(s) were performed by non-physician practitioner and as supervising physician I was immediately available for consultation/collaboration.  I agree with above. Aleksei [name], MD 

## 2022-12-09 NOTE — H&P (View-Only) (Signed)
Patient ID: Robert [name] MRN: [id number] DOB/AGE: 1951/01/02 72 y.o.  Primary Care Physician:[name], Robert Quint, MD Primary Cardiologist: Robert Red, MD   FOCUSED CARDIOVASCULAR PROBLEM LIST:   1.  Severe aortic stenosis with an aortic valve area 0.72 cm2, mean gradient 40 mmHg, peak velocity of 3.8 m/s with ejection fraction 55 to 60%; EKG 2022 demonstrates atrial fibrillation without bundle-branch blocks. 2.  Paroxysmal atrial fibrillation on warfarin rather than DOAC per patient preference (see note 08/18/18) 3.  COVID infection January 2024 4.  GERD 5.  BMI 41  HISTORY OF PRESENT ILLNESS: The patient is a 72 y.o. male with the indicated medical history here for recommendations regarding his aortic valvular disease.  He was seen by Dr. Cristal [name] recently and the patient endorsed increasing shortness of breath with no chest pain or syncope.  He had previously had a PET stress test in early June which showed no evidence of ischemia however the test was thought to be intermediate risk given resting abnormal EF, decreased with stress, and severe coronary artery calcification of the LAD and right coronary artery.  The patient tells me that he is developed increasing shortness of breath over the last 6 months and endorses NYHA class II symptoms.  He occasionally gets presyncopal but has had no frank syncope.  He has had increasing fatigue and some exertional chest tightness as well.  He fortunately has not required emergency room visits or hospitalizations.  He is quite dissatisfied with the quality of his life because of this.  He likes to work outside in the yard and is unable to do this.  He is found that his shortness of breath has gotten a lot worse since he contracted COVID.  Since that time he is noticed increasing shortness of breath but again within the last few months this is gotten progressively worse.  He underwent an evaluation with Dr. Cristal [name] and underwent a  stress test as detailed above and an echocardiogram confirms significant aortic valvular disease.  He tells me he is not seen a dentist in a while and denies any dental complaints.  He does not smoke or drink.  He is very much against taking any anticoagulant other than Coumadin because of the availability of a reversal agent in case he were to bleed significantly.  Apparently he had family members who had severe hemorrhage while on DOAC's.  Past Medical History:  Diagnosis Date   Anxiety    Asthma    "no attack since acupuncture in 1990's"   Complication of anesthesia    SLOW TO WAKE UP / DIFFICULTY RESPONDING 2003, 3 days before could use left arm, "wild/crazy sometimes"   Cyst of left kidney    "benign"   DVT (deep venous thrombosis) (HCC) 2003   right femoral artery "from groin to knee"   Edema    Right Leg w/ h/o DVT postphlebitic   H/O blood transfusion reaction 2004   FFP, extreme swelling, could not breath   Headache(784.0)    MIGRAINES-not for a long time   History of pulmonary embolism 2003   both lungs   Hyperlipidemia    Knee pain    left   LBP (low back pain)    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, clear in 2012"   Obesity    Osteoarthritis    Peripheral vascular disease (HCC)    "poor circulation in  RT leg"   PONV (postoperative nausea and vomiting)    during surgery in  1990's   Spondylolisthesis    Warfarin anticoagulation    Wheezing    when laying on left side    Past Surgical History:  Procedure Laterality Date   APPENDECTOMY  1993   Dr Jamey Ripa   CHOLECYSTECTOMY  1993   FINGER SURGERY  2012   RT THUMB RECONSTRUCTION   HAND SURGERY Right    abcess   HEMORROIDECTOMY  1981   I & D KNEE WITH POLY EXCHANGE Left 04/04/2013   Procedure: IRRIGATION AND DEBRIDEMENT KNEE WITH POLY EXCHANGE AND INSERTION OF CEMENT BEADS;  Surgeon: Loanne Drilling, MD;  Location: [facility];  Service: Orthopedics;  Laterality: Left;   KNEE ARTHROSCOPY  1990   RT KNEE   KNEE  ARTHROSCOPY Right 2003   KNEE ARTHROSCOPY  1988   L KNEE   KNEE ARTHROSCOPY WITH MENISCAL REPAIR Left 10/22/2012   Procedure: LEFT KNEE ARTHROSCOPY PARTIAL MEDIAL AND LATERAL MENISCECTOMY AND DEBRIDEMENT, CHONDROPLASTY ;  Surgeon: Javier Docker, MD;  Location: [facility];  Service: Orthopedics;  Laterality: Left;   LUMBAR DISC SURGERY     LUMBAR FUSION  2003   Dr Jillyn Hidden   ORCHIECTOMY  1994   R post injury   TONSILLECTOMY  1967   TOTAL KNEE ARTHROPLASTY Left 03/21/2013   Procedure: LEFT TOTAL KNEE ARTHROPLASTY;  Surgeon: Loanne Drilling, MD;  Location: [facility];  Service: Orthopedics;  Laterality: Left;    Family History  Problem Relation Age of Onset   Heart disease Mother        CABG at age 33   Hyperlipidemia Other    Hypertension Other    Parkinsonism Other    Heart disease Sister     Social History   Socioeconomic History   Marital status: Married    Spouse name: Not on file   Number of children: 3   Years of education: Not on file   Highest education level: Bachelor's degree (e.g., BA, AB, BS)  Occupational History   Occupation: Disabled    Employer: DISABLED  Tobacco Use   Smoking status: Never   Smokeless tobacco: Never  Vaping Use   Vaping Use: Never used  Substance and Sexual Activity   Alcohol use: Never   Drug use: Never   Sexual activity: Yes  Other Topics Concern   Not on file  Social History Narrative   Right handed   One story home   Drinks caffeine coffee q am   Social Determinants of Health   Financial Resource Strain: Low Risk  (01/20/2022)   Overall Financial Resource Strain (CARDIA)    Difficulty of Paying Living Expenses: Not hard at all  Food Insecurity: Patient Declined (10/13/2022)   Hunger Vital Sign    Worried About Running Out of Food in the Last Year: Patient declined    Ran Out of Food in the Last Year: Patient declined  Transportation Needs: No Transportation Needs (10/13/2022)   PRAPARE - Administrator, Civil Service  (Medical): No    Lack of Transportation (Non-Medical): No  Physical Activity: Insufficiently Active (10/13/2022)   Exercise Vital Sign    Days of Exercise per Week: 3 days    Minutes of Exercise per Session: 30 min  Stress: No Stress Concern Present (01/20/2022)   [employer] of Occupational Health - Occupational Stress Questionnaire    Feeling of Stress : Not at all  Social Connections: Unknown (10/13/2022)   Social Connection and Isolation Panel [NHANES]    Frequency of Communication with  Friends and Family: Not on file    Frequency of Social Gatherings with Friends and Family: Not on file    Attends Religious Services: Patient declined    Active Member of Clubs or Organizations: Not on file    Attends Banker Meetings: Not on file    Marital Status: Married  Intimate Partner Violence: Not At Risk (01/20/2022)   Humiliation, Afraid, Rape, and Kick questionnaire    Fear of Current or Ex-Partner: No    Emotionally Abused: No    Physically Abused: No    Sexually Abused: No     Prior to Admission medications   Medication Sig Start Date End Date Taking? Authorizing Provider  Aflibercept (EYLEA) 2 MG/0.05ML SOLN by Intravitreal route. MONTHLY INJECTION for Retinal Vein Occlusion and Macular Occlusion    [provider]  albuterol (PROVENTIL) (2.5 MG/3ML) 0.083% nebulizer solution Take 3 mLs (2.5 mg total) by nebulization every 4 (four) hours as needed for wheezing or shortness of breath. Dx  J45.909 05/02/21   Terressa Koyanagi, DO  albuterol (VENTOLIN HFA) 108 (90 Base) MCG/ACT inhaler Inhale 2 puffs into the lungs every 6 (six) hours as needed for wheezing or shortness of breath. 03/22/20   Ghimire, Werner Lean, MD  allopurinol (ZYLOPRIM) 100 MG tablet Take 1 tablet (100 mg total) by mouth daily. 09/17/22   [name], Robert Quint, MD  celecoxib (CELEBREX) 200 MG capsule Take 1 capsule (200 mg total) by mouth 2 (two) times daily. 04/09/22   [name], Robert Quint, MD   cetirizine (ZYRTEC) 10 MG tablet Take 1 tablet by mouth once daily 10/13/22   [name], Robert Quint, MD  Cholecalciferol (VITAMIN D3) 125 MCG (5000 UT) CAPS Take 1 capsule (5,000 Units total) by mouth daily. 12/25/19   [name], Robert Quint, MD  colchicine 0.6 MG tablet Take two tablets prn gout attack.Then take another one in 1-2 hrs. Do not repeat for 3 days. 09/01/22   [name], Robert Quint, MD  famotidine (PEPCID) 40 MG tablet Take 1 tablet by mouth once daily 04/30/22   [name], Robert Quint, MD  fish oil-omega-3 fatty acids 1000 MG capsule Take 2 g by mouth every morning.    [provider]  furosemide (LASIX) 20 MG tablet TAKE 1 TABLET DAILY 05/28/22   [name], Robert Quint, MD  glucosamine-chondroitin 500-400 MG tablet Take 1 tablet by mouth every morning.    [provider]  nortriptyline (PAMELOR) 25 MG capsule TAKE 1 CAPSULE AT BEDTIME 06/03/22   [name], Robert Quint, MD  predniSONE (DELTASONE) 5 MG tablet TAKE 1 TABLET DAILY WITH BREAKFAST 05/28/22   [name], Robert Quint, MD  pregabalin (LYRICA) 75 MG capsule Take 1 capsule (75 mg total) by mouth 3 (three) times daily as needed. 07/14/22   [name], Robert Quint, MD  triamcinolone cream (KENALOG) 0.1 % Apply 1 Application topically 3 (three) times daily. 04/23/22   [name], Robert Quint, MD  warfarin (COUMADIN) 3 MG tablet TAKE 1 TABLET DAILY OR TAKE AS DIRECTED BY ANTICOAGULATION CLINIC 07/16/21   [name], Robert Quint, MD  warfarin (COUMADIN) 6 MG tablet TAKE 1/2 TABLET BY MOUTH DAILY EXCEPT TAKE 1 TABLET ON FRIDAYS OR AS DIRECTED BY ANTICOAGULATION CLINIC 11/14/22   [name], Robert Quint, MD    Allergies  Allergen Reactions   Cat Hair Extract Itching   Diltiazem Anaphylaxis   Sulfa Antibiotics Hives and Swelling   Amiodarone     Hand swelling   Anoro Ellipta [Umeclidinium-Vilanterol]     Too much mucus  Breo Ellipta [Fluticasone Furoate-Vilanterol]     Too much mucus   Exalgo [Hydromorphone Hcl] Nausea Only    Sick and  nauseated   Fentanyl Other (See Comments)    REACTION: too sleepy   Gabapentin    Hydrocodone-Acetaminophen Nausea Only    REACTION: nausea   Lopressor [Metoprolol Tartrate]     Asthmatic issues    Penicillins Hives   Sulfonamide Derivatives Hives and Swelling   Tylenol [Acetaminophen]     Liver issues    REVIEW OF SYSTEMS:  General: no fevers/chills/night sweats Eyes: no blurry vision, diplopia, or amaurosis ENT: no sore throat or hearing loss Resp: no cough, wheezing, or hemoptysis CV: no edema or palpitations GI: no abdominal pain, nausea, vomiting, diarrhea, or constipation GU: no dysuria, frequency, or hematuria Skin: no rash Neuro: no headache, numbness, tingling, or weakness of extremities Musculoskeletal: no joint pain or swelling Heme: no bleeding, DVT, or easy bruising Endo: no polydipsia or polyuria  BP 128/80   Pulse 80   Ht 5\' 11"  (1.803 m)   Wt (!) 318 lb (144.2 kg)   SpO2 95%   BMI 44.35 kg/m   PHYSICAL EXAM: GEN:  AO x 3 in no acute distress HEENT: normal Dentition: Normal Neck: JVP normal. +2 carotid upstrokes without bruits. No thyromegaly. Lungs: equal expansion, clear bilaterally CV: Apex is discrete and nondisplaced, RRR 3/6 SEM Abd: soft, non-tender, non-distended; no bruit; positive bowel sounds Ext: no edema, ecchymoses, or cyanosis Vascular: 2+ femoral pulses, 2+ radial pulses       Skin: warm and dry without rash Neuro: CN II-XII grossly intact; motor and sensory grossly intact    DATA AND STUDIES:  EKG: Rate controlled atrial fibrillation without bundle-branch blocks  2D ECHO:  May 2024 1. The aortic valve is tricuspid. There is severe calcifcation of the  aortic valve. There is severe thickening of the aortic valve. Aortic valve  regurgitation is mild. Severe aortic valve stenosis. Aortic valve area, by  VTI measures 0.72 cm. Aortic  valve mean gradient measures 40.0 mmHg. Aortic valve Vmax measures 3.82  m/s. DI 0.23.   2.  Left ventricular ejection fraction, by estimation, is 55 to 60%. The  left ventricle has normal function. The left ventricle has no regional  wall motion abnormalities. There is mild concentric left ventricular  hypertrophy. Left ventricular diastolic  parameters are indeterminate.   3. Right ventricular systolic function is normal. The right ventricular  size is normal. There is normal pulmonary artery systolic pressure.   4. Left atrial size was moderately dilated.   5. Right atrial size was mildly dilated.   6. The mitral valve is grossly normal. Mild mitral valve regurgitation.   CARDIAC CATH: pending  STS RISK CALCULATOR: pending  NHYA CLASS: 2    ASSESSMENT AND PLAN:   Nonrheumatic aortic valve stenosis - Plan: Basic metabolic panel, CBC, CANCELED: Basic metabolic panel, CANCELED: CBC, CANCELED: EKG 12-Lead  Permanent atrial fibrillation (HCC) - Plan: Basic metabolic panel, CBC, EKG 12-Lead, EKG 12-Lead, CANCELED: Basic metabolic panel, CANCELED: CBC, CANCELED: EKG 12-Lead, CANCELED: EKG 12-Lead  Secondary hypercoagulable state (HCC) - Plan: Basic metabolic panel, CBC, CANCELED: Basic metabolic panel, CANCELED: CBC, CANCELED: EKG 12-Lead  BMI 40.0-44.9, adult (HCC) - Plan: Basic metabolic panel, CBC, CANCELED: Basic metabolic panel, CANCELED: CBC, CANCELED: EKG 12-Lead  The patient has developed NYHA class II symptoms of shortness of breath and stage D aortic stenosis.  I will refer him for coronary angiography and right heart catheterization study,  TAVR protocol CTA, and cardiothoracic surgical consultation.  The patient tells me if he were referred for surgical AVR he would likely refuse the procedure and so he is hopeful that a TAVR is recommended.  I have asked the patient to see his dentist.  He will contact our office if he has problems with this.  As detailed above the patient is very reluctant to switch from Coumadin for his chronic anticoagulation.  Given his body habitus  secondary access via the left radial approach may be advisable.  I have personally reviewed the patients imaging data as summarized above.  I have reviewed the natural history of aortic stenosis with the patient and family members who are present today. We have discussed the limitations of medical therapy and the poor prognosis associated with symptomatic aortic stenosis. We have also reviewed potential treatment options, including palliative medical therapy, conventional surgical aortic valve replacement, and transcatheter aortic valve replacement. We discussed treatment options in the context of this patient's specific comorbid medical conditions.   All of the patient's questions were answered today. Will make further recommendations based on the results of studies outlined above.   Total time spent with patient today 60 minutes. This includes reviewing records, evaluating the patient and coordinating care.   [name], MD  12/10/2022 3:06 PM    [facility] [address] [place], [place], [place]  [address] Phone: [phone]; Fax: [phone]

## 2022-12-09 NOTE — H&P (View-Only) (Signed)
Patient ID: Robert [name] MRN: [id number] DOB/AGE: 1951/01/02 72 y.o.  Primary Care Physician:[name], Georgina Quint, MD Primary Cardiologist: Jodelle Red, MD   FOCUSED CARDIOVASCULAR PROBLEM LIST:   1.  Severe aortic stenosis with an aortic valve area 0.72 cm2, mean gradient 40 mmHg, peak velocity of 3.8 m/s with ejection fraction 55 to 60%; EKG 2022 demonstrates atrial fibrillation without bundle-branch blocks. 2.  Paroxysmal atrial fibrillation on warfarin rather than DOAC per patient preference (see note 08/18/18) 3.  COVID infection January 2024 4.  GERD 5.  BMI 41  HISTORY OF PRESENT ILLNESS: The patient is a 72 y.o. male with the indicated medical history here for recommendations regarding his aortic valvular disease.  He was seen by Dr. Cristal Deer recently and the patient endorsed increasing shortness of breath with no chest pain or syncope.  He had previously had a PET stress test in early June which showed no evidence of ischemia however the test was thought to be intermediate risk given resting abnormal EF, decreased with stress, and severe coronary artery calcification of the LAD and right coronary artery.  The patient tells me that he is developed increasing shortness of breath over the last 6 months and endorses NYHA class II symptoms.  He occasionally gets presyncopal but has had no frank syncope.  He has had increasing fatigue and some exertional chest tightness as well.  He fortunately has not required emergency room visits or hospitalizations.  He is quite dissatisfied with the quality of his life because of this.  He likes to work outside in the yard and is unable to do this.  He is found that his shortness of breath has gotten a lot worse since he contracted COVID.  Since that time he is noticed increasing shortness of breath but again within the last few months this is gotten progressively worse.  He underwent an evaluation with Dr. Cristal Deer and underwent a  stress test as detailed above and an echocardiogram confirms significant aortic valvular disease.  He tells me he is not seen a dentist in a while and denies any dental complaints.  He does not smoke or drink.  He is very much against taking any anticoagulant other than Coumadin because of the availability of a reversal agent in case he were to bleed significantly.  Apparently he had family members who had severe hemorrhage while on DOAC's.  Past Medical History:  Diagnosis Date   Anxiety    Asthma    "no attack since acupuncture in 1990's"   Complication of anesthesia    SLOW TO WAKE UP / DIFFICULTY RESPONDING 2003, 3 days before could use left arm, "wild/crazy sometimes"   Cyst of left kidney    "benign"   DVT (deep venous thrombosis) (HCC) 2003   right femoral artery "from groin to knee"   Edema    Right Leg w/ h/o DVT postphlebitic   H/O blood transfusion reaction 2004   FFP, extreme swelling, could not breath   Headache(784.0)    MIGRAINES-not for a long time   History of pulmonary embolism 2003   both lungs   Hyperlipidemia    Knee pain    left   LBP (low back pain)    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, clear in 2012"   Obesity    Osteoarthritis    Peripheral vascular disease (HCC)    "poor circulation in  RT leg"   PONV (postoperative nausea and vomiting)    during surgery in  1990's   Spondylolisthesis    Warfarin anticoagulation    Wheezing    when laying on left side    Past Surgical History:  Procedure Laterality Date   APPENDECTOMY  1993   Dr Jamey Ripa   CHOLECYSTECTOMY  1993   FINGER SURGERY  2012   RT THUMB RECONSTRUCTION   HAND SURGERY Right    abcess   HEMORROIDECTOMY  1981   I & D KNEE WITH POLY EXCHANGE Left 04/04/2013   Procedure: IRRIGATION AND DEBRIDEMENT KNEE WITH POLY EXCHANGE AND INSERTION OF CEMENT BEADS;  Surgeon: Loanne Drilling, MD;  Location: [facility];  Service: Orthopedics;  Laterality: Left;   KNEE ARTHROSCOPY  1990   RT KNEE   KNEE  ARTHROSCOPY Right 2003   KNEE ARTHROSCOPY  1988   L KNEE   KNEE ARTHROSCOPY WITH MENISCAL REPAIR Left 10/22/2012   Procedure: LEFT KNEE ARTHROSCOPY PARTIAL MEDIAL AND LATERAL MENISCECTOMY AND DEBRIDEMENT, CHONDROPLASTY ;  Surgeon: Javier Docker, MD;  Location: [facility];  Service: Orthopedics;  Laterality: Left;   LUMBAR DISC SURGERY     LUMBAR FUSION  2003   Dr Jillyn Hidden   ORCHIECTOMY  1994   R post injury   TONSILLECTOMY  1967   TOTAL KNEE ARTHROPLASTY Left 03/21/2013   Procedure: LEFT TOTAL KNEE ARTHROPLASTY;  Surgeon: Loanne Drilling, MD;  Location: [facility];  Service: Orthopedics;  Laterality: Left;    Family History  Problem Relation Age of Onset   Heart disease Mother        CABG at age 33   Hyperlipidemia Other    Hypertension Other    Parkinsonism Other    Heart disease Sister     Social History   Socioeconomic History   Marital status: Married    Spouse name: Not on file   Number of children: 3   Years of education: Not on file   Highest education level: Bachelor's degree (e.g., BA, AB, BS)  Occupational History   Occupation: Disabled    Employer: DISABLED  Tobacco Use   Smoking status: Never   Smokeless tobacco: Never  Vaping Use   Vaping Use: Never used  Substance and Sexual Activity   Alcohol use: Never   Drug use: Never   Sexual activity: Yes  Other Topics Concern   Not on file  Social History Narrative   Right handed   One story home   Drinks caffeine coffee q am   Social Determinants of Health   Financial Resource Strain: Low Risk  (01/20/2022)   Overall Financial Resource Strain (CARDIA)    Difficulty of Paying Living Expenses: Not hard at all  Food Insecurity: Patient Declined (10/13/2022)   Hunger Vital Sign    Worried About Running Out of Food in the Last Year: Patient declined    Ran Out of Food in the Last Year: Patient declined  Transportation Needs: No Transportation Needs (10/13/2022)   PRAPARE - Administrator, Civil Service  (Medical): No    Lack of Transportation (Non-Medical): No  Physical Activity: Insufficiently Active (10/13/2022)   Exercise Vital Sign    Days of Exercise per Week: 3 days    Minutes of Exercise per Session: 30 min  Stress: No Stress Concern Present (01/20/2022)   [employer] of Occupational Health - Occupational Stress Questionnaire    Feeling of Stress : Not at all  Social Connections: Unknown (10/13/2022)   Social Connection and Isolation Panel [NHANES]    Frequency of Communication with  Friends and Family: Not on file    Frequency of Social Gatherings with Friends and Family: Not on file    Attends Religious Services: Patient declined    Active Member of Clubs or Organizations: Not on file    Attends Banker Meetings: Not on file    Marital Status: Married  Intimate Partner Violence: Not At Risk (01/20/2022)   Humiliation, Afraid, Rape, and Kick questionnaire    Fear of Current or Ex-Partner: No    Emotionally Abused: No    Physically Abused: No    Sexually Abused: No     Prior to Admission medications   Medication Sig Start Date End Date Taking? Authorizing Provider  Aflibercept (EYLEA) 2 MG/0.05ML SOLN by Intravitreal route. MONTHLY INJECTION for Retinal Vein Occlusion and Macular Occlusion    [provider]  albuterol (PROVENTIL) (2.5 MG/3ML) 0.083% nebulizer solution Take 3 mLs (2.5 mg total) by nebulization every 4 (four) hours as needed for wheezing or shortness of breath. Dx  J45.909 05/02/21   Terressa Koyanagi, DO  albuterol (VENTOLIN HFA) 108 (90 Base) MCG/ACT inhaler Inhale 2 puffs into the lungs every 6 (six) hours as needed for wheezing or shortness of breath. 03/22/20   Ghimire, Werner Lean, MD  allopurinol (ZYLOPRIM) 100 MG tablet Take 1 tablet (100 mg total) by mouth daily. 09/17/22   [name], Georgina Quint, MD  celecoxib (CELEBREX) 200 MG capsule Take 1 capsule (200 mg total) by mouth 2 (two) times daily. 04/09/22   [name], Georgina Quint, MD   cetirizine (ZYRTEC) 10 MG tablet Take 1 tablet by mouth once daily 10/13/22   [name], Georgina Quint, MD  Cholecalciferol (VITAMIN D3) 125 MCG (5000 UT) CAPS Take 1 capsule (5,000 Units total) by mouth daily. 12/25/19   [name], Georgina Quint, MD  colchicine 0.6 MG tablet Take two tablets prn gout attack.Then take another one in 1-2 hrs. Do not repeat for 3 days. 09/01/22   [name], Georgina Quint, MD  famotidine (PEPCID) 40 MG tablet Take 1 tablet by mouth once daily 04/30/22   [name], Georgina Quint, MD  fish oil-omega-3 fatty acids 1000 MG capsule Take 2 g by mouth every morning.    [provider]  furosemide (LASIX) 20 MG tablet TAKE 1 TABLET DAILY 05/28/22   [name], Georgina Quint, MD  glucosamine-chondroitin 500-400 MG tablet Take 1 tablet by mouth every morning.    [provider]  nortriptyline (PAMELOR) 25 MG capsule TAKE 1 CAPSULE AT BEDTIME 06/03/22   [name], Georgina Quint, MD  predniSONE (DELTASONE) 5 MG tablet TAKE 1 TABLET DAILY WITH BREAKFAST 05/28/22   [name], Georgina Quint, MD  pregabalin (LYRICA) 75 MG capsule Take 1 capsule (75 mg total) by mouth 3 (three) times daily as needed. 07/14/22   [name], Georgina Quint, MD  triamcinolone cream (KENALOG) 0.1 % Apply 1 Application topically 3 (three) times daily. 04/23/22   [name], Georgina Quint, MD  warfarin (COUMADIN) 3 MG tablet TAKE 1 TABLET DAILY OR TAKE AS DIRECTED BY ANTICOAGULATION CLINIC 07/16/21   [name], Georgina Quint, MD  warfarin (COUMADIN) 6 MG tablet TAKE 1/2 TABLET BY MOUTH DAILY EXCEPT TAKE 1 TABLET ON FRIDAYS OR AS DIRECTED BY ANTICOAGULATION CLINIC 11/14/22   [name], Georgina Quint, MD    Allergies  Allergen Reactions   Cat Hair Extract Itching   Diltiazem Anaphylaxis   Sulfa Antibiotics Hives and Swelling   Amiodarone     Hand swelling   Anoro Ellipta [Umeclidinium-Vilanterol]     Too much mucus  Breo Ellipta [Fluticasone Furoate-Vilanterol]     Too much mucus   Exalgo [Hydromorphone Hcl] Nausea Only    Sick and  nauseated   Fentanyl Other (See Comments)    REACTION: too sleepy   Gabapentin    Hydrocodone-Acetaminophen Nausea Only    REACTION: nausea   Lopressor [Metoprolol Tartrate]     Asthmatic issues    Penicillins Hives   Sulfonamide Derivatives Hives and Swelling   Tylenol [Acetaminophen]     Liver issues    REVIEW OF SYSTEMS:  General: no fevers/chills/night sweats Eyes: no blurry vision, diplopia, or amaurosis ENT: no sore throat or hearing loss Resp: no cough, wheezing, or hemoptysis CV: no edema or palpitations GI: no abdominal pain, nausea, vomiting, diarrhea, or constipation GU: no dysuria, frequency, or hematuria Skin: no rash Neuro: no headache, numbness, tingling, or weakness of extremities Musculoskeletal: no joint pain or swelling Heme: no bleeding, DVT, or easy bruising Endo: no polydipsia or polyuria  BP 128/80   Pulse 80   Ht 5\' 11"  (1.803 m)   Wt (!) 318 lb (144.2 kg)   SpO2 95%   BMI 44.35 kg/m   PHYSICAL EXAM: GEN:  AO x 3 in no acute distress HEENT: normal Dentition: Normal Neck: JVP normal. +2 carotid upstrokes without bruits. No thyromegaly. Lungs: equal expansion, clear bilaterally CV: Apex is discrete and nondisplaced, RRR 3/6 SEM Abd: soft, non-tender, non-distended; no bruit; positive bowel sounds Ext: no edema, ecchymoses, or cyanosis Vascular: 2+ femoral pulses, 2+ radial pulses       Skin: warm and dry without rash Neuro: CN II-XII grossly intact; motor and sensory grossly intact    DATA AND STUDIES:  EKG: Rate controlled atrial fibrillation without bundle-branch blocks  2D ECHO:  May 2024 1. The aortic valve is tricuspid. There is severe calcifcation of the  aortic valve. There is severe thickening of the aortic valve. Aortic valve  regurgitation is mild. Severe aortic valve stenosis. Aortic valve area, by  VTI measures 0.72 cm. Aortic  valve mean gradient measures 40.0 mmHg. Aortic valve Vmax measures 3.82  m/s. DI 0.23.   2.  Left ventricular ejection fraction, by estimation, is 55 to 60%. The  left ventricle has normal function. The left ventricle has no regional  wall motion abnormalities. There is mild concentric left ventricular  hypertrophy. Left ventricular diastolic  parameters are indeterminate.   3. Right ventricular systolic function is normal. The right ventricular  size is normal. There is normal pulmonary artery systolic pressure.   4. Left atrial size was moderately dilated.   5. Right atrial size was mildly dilated.   6. The mitral valve is grossly normal. Mild mitral valve regurgitation.   CARDIAC CATH: pending  STS RISK CALCULATOR: pending  NHYA CLASS: 2    ASSESSMENT AND PLAN:   Nonrheumatic aortic valve stenosis - Plan: Basic metabolic panel, CBC, CANCELED: Basic metabolic panel, CANCELED: CBC, CANCELED: EKG 12-Lead  Permanent atrial fibrillation (HCC) - Plan: Basic metabolic panel, CBC, EKG 12-Lead, EKG 12-Lead, CANCELED: Basic metabolic panel, CANCELED: CBC, CANCELED: EKG 12-Lead, CANCELED: EKG 12-Lead  Secondary hypercoagulable state (HCC) - Plan: Basic metabolic panel, CBC, CANCELED: Basic metabolic panel, CANCELED: CBC, CANCELED: EKG 12-Lead  BMI 40.0-44.9, adult (HCC) - Plan: Basic metabolic panel, CBC, CANCELED: Basic metabolic panel, CANCELED: CBC, CANCELED: EKG 12-Lead  The patient has developed NYHA class II symptoms of shortness of breath and stage D aortic stenosis.  I will refer him for coronary angiography and right heart catheterization study,  TAVR protocol CTA, and cardiothoracic surgical consultation.  The patient tells me if he were referred for surgical AVR he would likely refuse the procedure and so he is hopeful that a TAVR is recommended.  I have asked the patient to see his dentist.  He will contact our office if he has problems with this.  As detailed above the patient is very reluctant to switch from Coumadin for his chronic anticoagulation.  Given his body habitus  secondary access via the left radial approach may be advisable.  I have personally reviewed the patients imaging data as summarized above.  I have reviewed the natural history of aortic stenosis with the patient and family members who are present today. We have discussed the limitations of medical therapy and the poor prognosis associated with symptomatic aortic stenosis. We have also reviewed potential treatment options, including palliative medical therapy, conventional surgical aortic valve replacement, and transcatheter aortic valve replacement. We discussed treatment options in the context of this patient's specific comorbid medical conditions.   All of the patient's questions were answered today. Will make further recommendations based on the results of studies outlined above.   Total time spent with patient today 60 minutes. This includes reviewing records, evaluating the patient and coordinating care.   [name], MD  12/10/2022 3:06 PM    [facility] [address] [place], [place], [place]  [address] Phone: [phone]; Fax: [phone]

## 2022-12-09 NOTE — Progress Notes (Unsigned)
Patient ID: Robert [name] MRN: [id number] DOB/AGE: 1951/01/02 72 y.o.  Primary Care Physician:[name], Georgina Quint, MD Primary Cardiologist: Jodelle Red, MD   FOCUSED CARDIOVASCULAR PROBLEM LIST:   1.  Severe aortic stenosis with an aortic valve area 0.72 cm2, mean gradient 40 mmHg, peak velocity of 3.8 m/s with ejection fraction 55 to 60%; EKG 2022 demonstrates atrial fibrillation without bundle-branch blocks. 2.  Paroxysmal atrial fibrillation on warfarin rather than DOAC per patient preference (see note 08/18/18) 3.  COVID infection January 2024 4.  GERD 5.  BMI 41  HISTORY OF PRESENT ILLNESS: The patient is a 72 y.o. male with the indicated medical history here for recommendations regarding his aortic valvular disease.  He was seen by Dr. Cristal Deer recently and the patient endorsed increasing shortness of breath with no chest pain or syncope.  He had previously had a PET stress test in early June which showed no evidence of ischemia however the test was thought to be intermediate risk given resting abnormal EF, decreased with stress, and severe coronary artery calcification of the LAD and right coronary artery.  The patient tells me that he is developed increasing shortness of breath over the last 6 months and endorses NYHA class II symptoms.  He occasionally gets presyncopal but has had no frank syncope.  He has had increasing fatigue and some exertional chest tightness as well.  He fortunately has not required emergency room visits or hospitalizations.  He is quite dissatisfied with the quality of his life because of this.  He likes to work outside in the yard and is unable to do this.  He is found that his shortness of breath has gotten a lot worse since he contracted COVID.  Since that time he is noticed increasing shortness of breath but again within the last few months this is gotten progressively worse.  He underwent an evaluation with Dr. Cristal Deer and underwent a  stress test as detailed above and an echocardiogram confirms significant aortic valvular disease.  He tells me he is not seen a dentist in a while and denies any dental complaints.  He does not smoke or drink.  He is very much against taking any anticoagulant other than Coumadin because of the availability of a reversal agent in case he were to bleed significantly.  Apparently he had family members who had severe hemorrhage while on DOAC's.  Past Medical History:  Diagnosis Date   Anxiety    Asthma    "no attack since acupuncture in 1990's"   Complication of anesthesia    SLOW TO WAKE UP / DIFFICULTY RESPONDING 2003, 3 days before could use left arm, "wild/crazy sometimes"   Cyst of left kidney    "benign"   DVT (deep venous thrombosis) (HCC) 2003   right femoral artery "from groin to knee"   Edema    Right Leg w/ h/o DVT postphlebitic   H/O blood transfusion reaction 2004   FFP, extreme swelling, could not breath   Headache(784.0)    MIGRAINES-not for a long time   History of pulmonary embolism 2003   both lungs   Hyperlipidemia    Knee pain    left   LBP (low back pain)    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, clear in 2012"   Obesity    Osteoarthritis    Peripheral vascular disease (HCC)    "poor circulation in  RT leg"   PONV (postoperative nausea and vomiting)    during surgery in  1990's   Spondylolisthesis    Warfarin anticoagulation    Wheezing    when laying on left side    Past Surgical History:  Procedure Laterality Date   APPENDECTOMY  1993   Dr Jamey Ripa   CHOLECYSTECTOMY  1993   FINGER SURGERY  2012   RT THUMB RECONSTRUCTION   HAND SURGERY Right    abcess   HEMORROIDECTOMY  1981   I & D KNEE WITH POLY EXCHANGE Left 04/04/2013   Procedure: IRRIGATION AND DEBRIDEMENT KNEE WITH POLY EXCHANGE AND INSERTION OF CEMENT BEADS;  Surgeon: Loanne Drilling, MD;  Location: [facility];  Service: Orthopedics;  Laterality: Left;   KNEE ARTHROSCOPY  1990   RT KNEE   KNEE  ARTHROSCOPY Right 2003   KNEE ARTHROSCOPY  1988   L KNEE   KNEE ARTHROSCOPY WITH MENISCAL REPAIR Left 10/22/2012   Procedure: LEFT KNEE ARTHROSCOPY PARTIAL MEDIAL AND LATERAL MENISCECTOMY AND DEBRIDEMENT, CHONDROPLASTY ;  Surgeon: Javier Docker, MD;  Location: [facility];  Service: Orthopedics;  Laterality: Left;   LUMBAR DISC SURGERY     LUMBAR FUSION  2003   Dr Jillyn Hidden   ORCHIECTOMY  1994   R post injury   TONSILLECTOMY  1967   TOTAL KNEE ARTHROPLASTY Left 03/21/2013   Procedure: LEFT TOTAL KNEE ARTHROPLASTY;  Surgeon: Loanne Drilling, MD;  Location: [facility];  Service: Orthopedics;  Laterality: Left;    Family History  Problem Relation Age of Onset   Heart disease Mother        CABG at age 33   Hyperlipidemia Other    Hypertension Other    Parkinsonism Other    Heart disease Sister     Social History   Socioeconomic History   Marital status: Married    Spouse name: Not on file   Number of children: 3   Years of education: Not on file   Highest education level: Bachelor's degree (e.g., BA, AB, BS)  Occupational History   Occupation: Disabled    Employer: DISABLED  Tobacco Use   Smoking status: Never   Smokeless tobacco: Never  Vaping Use   Vaping Use: Never used  Substance and Sexual Activity   Alcohol use: Never   Drug use: Never   Sexual activity: Yes  Other Topics Concern   Not on file  Social History Narrative   Right handed   One story home   Drinks caffeine coffee q am   Social Determinants of Health   Financial Resource Strain: Low Risk  (01/20/2022)   Overall Financial Resource Strain (CARDIA)    Difficulty of Paying Living Expenses: Not hard at all  Food Insecurity: Patient Declined (10/13/2022)   Hunger Vital Sign    Worried About Running Out of Food in the Last Year: Patient declined    Ran Out of Food in the Last Year: Patient declined  Transportation Needs: No Transportation Needs (10/13/2022)   PRAPARE - Administrator, Civil Service  (Medical): No    Lack of Transportation (Non-Medical): No  Physical Activity: Insufficiently Active (10/13/2022)   Exercise Vital Sign    Days of Exercise per Week: 3 days    Minutes of Exercise per Session: 30 min  Stress: No Stress Concern Present (01/20/2022)   [employer] of Occupational Health - Occupational Stress Questionnaire    Feeling of Stress : Not at all  Social Connections: Unknown (10/13/2022)   Social Connection and Isolation Panel [NHANES]    Frequency of Communication with  Friends and Family: Not on file    Frequency of Social Gatherings with Friends and Family: Not on file    Attends Religious Services: Patient declined    Active Member of Clubs or Organizations: Not on file    Attends Banker Meetings: Not on file    Marital Status: Married  Intimate Partner Violence: Not At Risk (01/20/2022)   Humiliation, Afraid, Rape, and Kick questionnaire    Fear of Current or Ex-Partner: No    Emotionally Abused: No    Physically Abused: No    Sexually Abused: No     Prior to Admission medications   Medication Sig Start Date End Date Taking? Authorizing Provider  Aflibercept (EYLEA) 2 MG/0.05ML SOLN by Intravitreal route. MONTHLY INJECTION for Retinal Vein Occlusion and Macular Occlusion    [provider]  albuterol (PROVENTIL) (2.5 MG/3ML) 0.083% nebulizer solution Take 3 mLs (2.5 mg total) by nebulization every 4 (four) hours as needed for wheezing or shortness of breath. Dx  J45.909 05/02/21   Terressa Koyanagi, DO  albuterol (VENTOLIN HFA) 108 (90 Base) MCG/ACT inhaler Inhale 2 puffs into the lungs every 6 (six) hours as needed for wheezing or shortness of breath. 03/22/20   Ghimire, Werner Lean, MD  allopurinol (ZYLOPRIM) 100 MG tablet Take 1 tablet (100 mg total) by mouth daily. 09/17/22   [name], Georgina Quint, MD  celecoxib (CELEBREX) 200 MG capsule Take 1 capsule (200 mg total) by mouth 2 (two) times daily. 04/09/22   [name], Georgina Quint, MD   cetirizine (ZYRTEC) 10 MG tablet Take 1 tablet by mouth once daily 10/13/22   [name], Georgina Quint, MD  Cholecalciferol (VITAMIN D3) 125 MCG (5000 UT) CAPS Take 1 capsule (5,000 Units total) by mouth daily. 12/25/19   [name], Georgina Quint, MD  colchicine 0.6 MG tablet Take two tablets prn gout attack.Then take another one in 1-2 hrs. Do not repeat for 3 days. 09/01/22   [name], Georgina Quint, MD  famotidine (PEPCID) 40 MG tablet Take 1 tablet by mouth once daily 04/30/22   [name], Georgina Quint, MD  fish oil-omega-3 fatty acids 1000 MG capsule Take 2 g by mouth every morning.    [provider]  furosemide (LASIX) 20 MG tablet TAKE 1 TABLET DAILY 05/28/22   [name], Georgina Quint, MD  glucosamine-chondroitin 500-400 MG tablet Take 1 tablet by mouth every morning.    [provider]  nortriptyline (PAMELOR) 25 MG capsule TAKE 1 CAPSULE AT BEDTIME 06/03/22   [name], Georgina Quint, MD  predniSONE (DELTASONE) 5 MG tablet TAKE 1 TABLET DAILY WITH BREAKFAST 05/28/22   [name], Georgina Quint, MD  pregabalin (LYRICA) 75 MG capsule Take 1 capsule (75 mg total) by mouth 3 (three) times daily as needed. 07/14/22   [name], Georgina Quint, MD  triamcinolone cream (KENALOG) 0.1 % Apply 1 Application topically 3 (three) times daily. 04/23/22   [name], Georgina Quint, MD  warfarin (COUMADIN) 3 MG tablet TAKE 1 TABLET DAILY OR TAKE AS DIRECTED BY ANTICOAGULATION CLINIC 07/16/21   [name], Georgina Quint, MD  warfarin (COUMADIN) 6 MG tablet TAKE 1/2 TABLET BY MOUTH DAILY EXCEPT TAKE 1 TABLET ON FRIDAYS OR AS DIRECTED BY ANTICOAGULATION CLINIC 11/14/22   [name], Georgina Quint, MD    Allergies  Allergen Reactions   Cat Hair Extract Itching   Diltiazem Anaphylaxis   Sulfa Antibiotics Hives and Swelling   Amiodarone     Hand swelling   Anoro Ellipta [Umeclidinium-Vilanterol]     Too much mucus  Breo Ellipta [Fluticasone Furoate-Vilanterol]     Too much mucus   Exalgo [Hydromorphone Hcl] Nausea Only    Sick and  nauseated   Fentanyl Other (See Comments)    REACTION: too sleepy   Gabapentin    Hydrocodone-Acetaminophen Nausea Only    REACTION: nausea   Lopressor [Metoprolol Tartrate]     Asthmatic issues    Penicillins Hives   Sulfonamide Derivatives Hives and Swelling   Tylenol [Acetaminophen]     Liver issues    REVIEW OF SYSTEMS:  General: no fevers/chills/night sweats Eyes: no blurry vision, diplopia, or amaurosis ENT: no sore throat or hearing loss Resp: no cough, wheezing, or hemoptysis CV: no edema or palpitations GI: no abdominal pain, nausea, vomiting, diarrhea, or constipation GU: no dysuria, frequency, or hematuria Skin: no rash Neuro: no headache, numbness, tingling, or weakness of extremities Musculoskeletal: no joint pain or swelling Heme: no bleeding, DVT, or easy bruising Endo: no polydipsia or polyuria  BP 128/80   Pulse 80   Ht 5\' 11"  (1.803 m)   Wt (!) 318 lb (144.2 kg)   SpO2 95%   BMI 44.35 kg/m   PHYSICAL EXAM: GEN:  AO x 3 in no acute distress HEENT: normal Dentition: Normal Neck: JVP normal. +2 carotid upstrokes without bruits. No thyromegaly. Lungs: equal expansion, clear bilaterally CV: Apex is discrete and nondisplaced, RRR 3/6 SEM Abd: soft, non-tender, non-distended; no bruit; positive bowel sounds Ext: no edema, ecchymoses, or cyanosis Vascular: 2+ femoral pulses, 2+ radial pulses       Skin: warm and dry without rash Neuro: CN II-XII grossly intact; motor and sensory grossly intact    DATA AND STUDIES:  EKG: Rate controlled atrial fibrillation without bundle-branch blocks  2D ECHO:  May 2024 1. The aortic valve is tricuspid. There is severe calcifcation of the  aortic valve. There is severe thickening of the aortic valve. Aortic valve  regurgitation is mild. Severe aortic valve stenosis. Aortic valve area, by  VTI measures 0.72 cm. Aortic  valve mean gradient measures 40.0 mmHg. Aortic valve Vmax measures 3.82  m/s. DI 0.23.   2.  Left ventricular ejection fraction, by estimation, is 55 to 60%. The  left ventricle has normal function. The left ventricle has no regional  wall motion abnormalities. There is mild concentric left ventricular  hypertrophy. Left ventricular diastolic  parameters are indeterminate.   3. Right ventricular systolic function is normal. The right ventricular  size is normal. There is normal pulmonary artery systolic pressure.   4. Left atrial size was moderately dilated.   5. Right atrial size was mildly dilated.   6. The mitral valve is grossly normal. Mild mitral valve regurgitation.   CARDIAC CATH: pending  STS RISK CALCULATOR: pending  NHYA CLASS: 2    ASSESSMENT AND PLAN:   Nonrheumatic aortic valve stenosis - Plan: Basic metabolic panel, CBC, CANCELED: Basic metabolic panel, CANCELED: CBC, CANCELED: EKG 12-Lead  Permanent atrial fibrillation (HCC) - Plan: Basic metabolic panel, CBC, EKG 12-Lead, EKG 12-Lead, CANCELED: Basic metabolic panel, CANCELED: CBC, CANCELED: EKG 12-Lead, CANCELED: EKG 12-Lead  Secondary hypercoagulable state (HCC) - Plan: Basic metabolic panel, CBC, CANCELED: Basic metabolic panel, CANCELED: CBC, CANCELED: EKG 12-Lead  BMI 40.0-44.9, adult (HCC) - Plan: Basic metabolic panel, CBC, CANCELED: Basic metabolic panel, CANCELED: CBC, CANCELED: EKG 12-Lead  The patient has developed NYHA class II symptoms of shortness of breath and stage D aortic stenosis.  I will refer him for coronary angiography and right heart catheterization study,  TAVR protocol CTA, and cardiothoracic surgical consultation.  The patient tells me if he were referred for surgical AVR he would likely refuse the procedure and so he is hopeful that a TAVR is recommended.  I have asked the patient to see his dentist.  He will contact our office if he has problems with this.  As detailed above the patient is very reluctant to switch from Coumadin for his chronic anticoagulation.  Given his body habitus  secondary access via the left radial approach may be advisable.  I have personally reviewed the patients imaging data as summarized above.  I have reviewed the natural history of aortic stenosis with the patient and family members who are present today. We have discussed the limitations of medical therapy and the poor prognosis associated with symptomatic aortic stenosis. We have also reviewed potential treatment options, including palliative medical therapy, conventional surgical aortic valve replacement, and transcatheter aortic valve replacement. We discussed treatment options in the context of this patient's specific comorbid medical conditions.   All of the patient's questions were answered today. Will make further recommendations based on the results of studies outlined above.   Total time spent with patient today 60 minutes. This includes reviewing records, evaluating the patient and coordinating care.   [name], MD  12/10/2022 3:06 PM    [facility] [address] [place], [place], [place]  [address] Phone: [phone]; Fax: [phone]

## 2022-12-10 ENCOUNTER — Ambulatory Visit: Payer: Medicare Other | Attending: Internal Medicine | Admitting: Internal Medicine

## 2022-12-10 ENCOUNTER — Encounter: Payer: Self-pay | Admitting: Internal Medicine

## 2022-12-10 VITALS — BP 128/80 | HR 80 | Ht 71.0 in | Wt 318.0 lb

## 2022-12-10 DIAGNOSIS — I350 Nonrheumatic aortic (valve) stenosis: Secondary | ICD-10-CM | POA: Diagnosis not present

## 2022-12-10 DIAGNOSIS — I4821 Permanent atrial fibrillation: Secondary | ICD-10-CM | POA: Diagnosis not present

## 2022-12-10 DIAGNOSIS — D6869 Other thrombophilia: Secondary | ICD-10-CM | POA: Diagnosis not present

## 2022-12-10 DIAGNOSIS — Z6841 Body Mass Index (BMI) 40.0 and over, adult: Secondary | ICD-10-CM | POA: Insufficient documentation

## 2022-12-10 NOTE — Patient Instructions (Addendum)
Medication Instructions:  Your physician recommends that you continue on your current medications as directed. Please refer to the Current Medication list given to you today.  *If you need a refill on your cardiac medications before your next appointment, please call your pharmacy*   Lab Work: CBC, BMET If you have labs (blood work) drawn today and your tests are completely normal, you will receive your results only by: MyChart Message (if you have MyChart) OR A paper copy in the mail If you have any lab test that is abnormal or we need to change your treatment, we will call you to review the results.   Testing/Procedures: Your physician has requested that you have a cardiac catheterization. Cardiac catheterization is used to diagnose and/or treat various heart conditions. Doctors may recommend this procedure for a number of different reasons. The most common reason is to evaluate chest pain. Chest pain can be a symptom of coronary artery disease (CAD), and cardiac catheterization can show whether plaque is narrowing or blocking your heart's arteries. This procedure is also used to evaluate the valves, as well as measure the blood flow and oxygen levels in different parts of your heart. For further information please visit [email or web address] Please follow instruction sheet, as given.    Follow-Up: At [facility], you and your health needs are our priority.  As part of our continuing mission to provide you with exceptional heart care, we have created designated Provider Care Teams.  These Care Teams include your primary Cardiologist (physician) and Advanced Practice Providers (APPs -  Physician Assistants and Nurse Practitioners) who all work together to provide you with the care you need, when you need it.  Your next appointment:   As directed by structural team  Provider:   Dr Lynnette Caffey  Other Instructions  [name] A DEPT OF [facility] AT [facility] [address] [place], [place] [address] [id number] [facility] [place] [address] Dept: [phone] Loc: [phone]  Robert [name]  12/10/2022  You are scheduled for a Cardiac Catheterization on Tuesday, June 25 with Dr. Alverda Skeans.  1. Please arrive at the [facility] (Main Entrance A) at [facility]: [address] [place], [place] [address] at 7:00 AM (This time is 2 hour(s) before your procedure to ensure your preparation). Free valet parking service is available. You will check in at ADMITTING. The support person will be asked to wait in the waiting room.  It is OK to have someone drop you off and come back when you are ready to be discharged.    Special note: Every effort is made to have your procedure done on time. Please understand that emergencies sometimes delay scheduled procedures.  2. Diet: Do not eat solid foods after midnight.  The patient may have clear liquids until 5am upon the day of the procedure.  3. Labs: You will need to have blood drawn on today  4. Medication instructions in preparation for your procedure:   Contrast Allergy: No   Stop taking Coumadin (Warfarin) on Friday, June 21.  Stop taking, Lasix (Furosemide)     On the morning of your procedure, take your Aspirin 81 mg and any morning medicines NOT listed above.  You may use sips of water.  5. Plan to go home the same day, you will only stay overnight if medically necessary. 6. Bring a current list of your medications and current insurance cards. 7. You MUST have a responsible person to  drive you home. 8. Someone MUST be with you the first 24 hours after you arrive home or your discharge will be delayed. 9. Please wear clothes that are easy to get on and off and wear slip-on shoes.  Thank you for allowing [place] to care for you!   -- Robert [name] Invasive Cardiovascular service

## 2022-12-10 NOTE — Progress Notes (Addendum)
Pre Surgical Assessment: 5 M Walk Test  66M=16.3ft  5 Meter Walk Test- trial 1: 7.16 seconds 5 Meter Walk Test- trial 2: 6.14 seconds 5 Meter Walk Test- trial 3: 5.17 seconds 5 Meter Walk Test Average: 6.16 seconds    ____________________________   Procedure Type: Isolated AVR PERIOPERATIVE OUTCOME ESTIMATE % Operative Mortality 2.4% Morbidity & Mortality 12.1% Stroke 1.15% Renal Failure 2.81% Reoperation 3.11% Prolonged Ventilation 6.64% Deep Sternal Wound Infection 0.338% Long Hospital Stay (>14 days) 3.83% Short Hospital Stay (<6 days)* 26.8%

## 2022-12-11 ENCOUNTER — Encounter (HOSPITAL_BASED_OUTPATIENT_CLINIC_OR_DEPARTMENT_OTHER): Payer: Self-pay | Admitting: Cardiology

## 2022-12-11 ENCOUNTER — Telehealth: Payer: Self-pay

## 2022-12-11 LAB — BASIC METABOLIC PANEL
BUN/Creatinine Ratio: 15 (ref 10–24)
BUN: 17 mg/dL (ref 8–27)
CO2: 25 mmol/L (ref 20–29)
Calcium: 9.3 mg/dL (ref 8.6–10.2)
Chloride: 100 mmol/L (ref 96–106)
Creatinine, Ser: 1.12 mg/dL (ref 0.76–1.27)
Glucose: 100 mg/dL — ABNORMAL HIGH (ref 70–99)
Potassium: 4.9 mmol/L (ref 3.5–5.2)
Sodium: 140 mmol/L (ref 134–144)
eGFR: 70 mL/min/{1.73_m2} (ref >59)

## 2022-12-11 LAB — CBC
Hematocrit: 49.6 % (ref 37.5–51.0)
Hemoglobin: 16.7 g/dL (ref 13.0–17.7)
MCH: 32.5 pg (ref 26.6–33.0)
MCHC: 33.7 g/dL (ref 31.5–35.7)
MCV: 97 fL (ref 79–97)
Platelets: 251 10*3/uL (ref 150–450)
RBC: 5.14 x10E6/uL (ref 4.14–5.80)
RDW: 13.1 % (ref 11.6–15.4)
WBC: 9.1 10*3/uL (ref 3.4–10.8)

## 2022-12-11 NOTE — Telephone Encounter (Signed)
Pt reports he will undergo a heart cath on 6/25. Cardiology has advised pt stop warfarin on 6/21 and take 81 mg aspirin on morning of procedure. Cardiology will advise when to restart warfarin.   Scheduled next INR check for 7/2. Pt verbalized understanding.

## 2022-12-16 ENCOUNTER — Ambulatory Visit (HOSPITAL_COMMUNITY)
Admission: RE | Admit: 2022-12-16 | Discharge: 2022-12-16 | Disposition: A | Discharge status: Home / Self Care | Payer: Medicare Other | Source: Ambulatory Visit | Attending: Internal Medicine | Admitting: Internal Medicine

## 2022-12-16 ENCOUNTER — Encounter (HOSPITAL_COMMUNITY)
Admission: RE | Disposition: A | Discharge status: Home / Self Care | Payer: Self-pay | Source: Ambulatory Visit | Attending: Internal Medicine

## 2022-12-16 ENCOUNTER — Other Ambulatory Visit: Payer: Self-pay

## 2022-12-16 DIAGNOSIS — I2510 Atherosclerotic heart disease of native coronary artery without angina pectoris: Secondary | ICD-10-CM | POA: Diagnosis not present

## 2022-12-16 DIAGNOSIS — K219 Gastro-esophageal reflux disease without esophagitis: Secondary | ICD-10-CM | POA: Diagnosis not present

## 2022-12-16 DIAGNOSIS — D6869 Other thrombophilia: Secondary | ICD-10-CM | POA: Diagnosis not present

## 2022-12-16 DIAGNOSIS — I4821 Permanent atrial fibrillation: Secondary | ICD-10-CM | POA: Diagnosis not present

## 2022-12-16 DIAGNOSIS — Z8616 Personal history of COVID-19: Secondary | ICD-10-CM | POA: Diagnosis not present

## 2022-12-16 DIAGNOSIS — Z6841 Body Mass Index (BMI) 40.0 and over, adult: Secondary | ICD-10-CM | POA: Diagnosis not present

## 2022-12-16 DIAGNOSIS — Z7901 Long term (current) use of anticoagulants: Secondary | ICD-10-CM | POA: Diagnosis not present

## 2022-12-16 DIAGNOSIS — I350 Nonrheumatic aortic (valve) stenosis: Secondary | ICD-10-CM

## 2022-12-16 DIAGNOSIS — Z01812 Encounter for preprocedural laboratory examination: Secondary | ICD-10-CM

## 2022-12-16 HISTORY — PX: RIGHT HEART CATH AND CORONARY ANGIOGRAPHY: CATH118264

## 2022-12-16 LAB — POCT I-STAT EG7
Acid-Base Excess: 0 mmol/L (ref 0.0–2.0)
Acid-base deficit: 1 mmol/L (ref 0.0–2.0)
Bicarbonate: 25.2 mmol/L (ref 20.0–28.0)
Bicarbonate: 25.8 mmol/L (ref 20.0–28.0)
Calcium, Ion: 1.15 mmol/L (ref 1.15–1.40)
Calcium, Ion: 1.17 mmol/L (ref 1.15–1.40)
HCT: 45 % (ref 39.0–52.0)
HCT: 46 % (ref 39.0–52.0)
Hemoglobin: 15.3 g/dL (ref 13.0–17.0)
Hemoglobin: 15.6 g/dL (ref 13.0–17.0)
O2 Saturation: 74 %
O2 Saturation: 76 %
Potassium: 3.8 mmol/L (ref 3.5–5.1)
Potassium: 3.8 mmol/L (ref 3.5–5.1)
Sodium: 141 mmol/L (ref 135–145)
Sodium: 141 mmol/L (ref 135–145)
TCO2: 27 mmol/L (ref 22–32)
TCO2: 27 mmol/L (ref 22–32)
pCO2, Ven: 45.3 mmHg (ref 44–60)
pCO2, Ven: 45.7 mmHg (ref 44–60)
pH, Ven: 7.349 (ref 7.25–7.43)
pH, Ven: 7.364 (ref 7.25–7.43)
pO2, Ven: 41 mmHg (ref 32–45)
pO2, Ven: 43 mmHg (ref 32–45)

## 2022-12-16 LAB — POCT I-STAT 7, (LYTES, BLD GAS, ICA,H+H)
Acid-base deficit: 2 mmol/L (ref 0.0–2.0)
Bicarbonate: 23.1 mmol/L (ref 20.0–28.0)
Calcium, Ion: 1.11 mmol/L — ABNORMAL LOW (ref 1.15–1.40)
HCT: 45 % (ref 39.0–52.0)
Hemoglobin: 15.3 g/dL (ref 13.0–17.0)
O2 Saturation: 96 %
Potassium: 3.7 mmol/L (ref 3.5–5.1)
Sodium: 141 mmol/L (ref 135–145)
TCO2: 24 mmol/L (ref 22–32)
pCO2 arterial: 39.5 mmHg (ref 32–48)
pH, Arterial: 7.376 (ref 7.35–7.45)
pO2, Arterial: 87 mmHg (ref 83–108)

## 2022-12-16 LAB — PROTIME-INR
INR: 1.3 — ABNORMAL HIGH (ref 0.8–1.2)
Prothrombin Time: 15.9 seconds — ABNORMAL HIGH (ref 11.4–15.2)

## 2022-12-16 SURGERY — RIGHT HEART CATH AND CORONARY ANGIOGRAPHY
Anesthesia: LOCAL

## 2022-12-16 MED ORDER — FUROSEMIDE 20 MG PO TABS: 20.0000 mg | ORAL_TABLET | Freq: Two times a day (BID) | ORAL | 3 refills | Status: DC

## 2022-12-16 MED ORDER — SODIUM CHLORIDE 0.9% FLUSH: 3.0000 mL | INTRAVENOUS | Status: DC | PRN

## 2022-12-16 MED ORDER — HEPARIN SODIUM (PORCINE) 1000 UNIT/ML IJ SOLN
INTRAMUSCULAR | Status: AC
  Filled 2022-12-16: qty 10

## 2022-12-16 MED ORDER — FENTANYL CITRATE (PF) 100 MCG/2ML IJ SOLN
INTRAMUSCULAR | Status: DC | PRN
  Administered 2022-12-16: 25 ug via INTRAVENOUS

## 2022-12-16 MED ORDER — FENTANYL CITRATE (PF) 100 MCG/2ML IJ SOLN
INTRAMUSCULAR | Status: AC
  Filled 2022-12-16: qty 2

## 2022-12-16 MED ORDER — HEPARIN SODIUM (PORCINE) 1000 UNIT/ML IJ SOLN
INTRAMUSCULAR | Status: DC | PRN
  Administered 2022-12-16: 5000 [IU] via INTRAVENOUS

## 2022-12-16 MED ORDER — HEPARIN (PORCINE) IN NACL 1000-0.9 UT/500ML-% IV SOLN
INTRAVENOUS | Status: DC | PRN
  Administered 2022-12-16 (×2): 500 mL

## 2022-12-16 MED ORDER — ASPIRIN 81 MG PO CHEW: 81.0000 mg | CHEWABLE_TABLET | ORAL | Status: DC

## 2022-12-16 MED ORDER — IOHEXOL 350 MG/ML SOLN
INTRAVENOUS | Status: DC | PRN
  Administered 2022-12-16: 40 mL

## 2022-12-16 MED ORDER — SODIUM CHLORIDE 0.9 % IV SOLN: 250.0000 mL | INTRAVENOUS | Status: DC | PRN

## 2022-12-16 MED ORDER — SODIUM CHLORIDE 0.9% FLUSH: 3.0000 mL | Freq: Two times a day (BID) | INTRAVENOUS | Status: DC

## 2022-12-16 MED ORDER — VERAPAMIL HCL 2.5 MG/ML IV SOLN
INTRAVENOUS | Status: AC
  Filled 2022-12-16: qty 2

## 2022-12-16 MED ORDER — MIDAZOLAM HCL 2 MG/2ML IJ SOLN
INTRAMUSCULAR | Status: AC
  Filled 2022-12-16: qty 2

## 2022-12-16 MED ORDER — SODIUM CHLORIDE 0.9 % WEIGHT BASED INFUSION: 1.0000 mL/kg/h | INTRAVENOUS | Status: DC

## 2022-12-16 MED ORDER — VERAPAMIL HCL 2.5 MG/ML IV SOLN
INTRAVENOUS | Status: DC | PRN
  Administered 2022-12-16: 10 mL via INTRA_ARTERIAL

## 2022-12-16 MED ORDER — MIDAZOLAM HCL 2 MG/2ML IJ SOLN
INTRAMUSCULAR | Status: DC | PRN
  Administered 2022-12-16: 1 mg via INTRAVENOUS

## 2022-12-16 MED ORDER — LIDOCAINE HCL (PF) 1 % IJ SOLN
INTRAMUSCULAR | Status: DC | PRN
  Administered 2022-12-16: 5 mL

## 2022-12-16 MED ORDER — LIDOCAINE HCL (PF) 1 % IJ SOLN
INTRAMUSCULAR | Status: AC
  Filled 2022-12-16: qty 30

## 2022-12-16 MED ORDER — SODIUM CHLORIDE 0.9 % WEIGHT BASED INFUSION
3.0000 mL/kg/h | INTRAVENOUS | Status: AC
  Administered 2022-12-16: 3 mL/kg/h via INTRAVENOUS

## 2022-12-16 SURGICAL SUPPLY — 11 items
CATH BALLN WEDGE 5F 110CM (CATHETERS) IMPLANT
CATH OPTITORQUE TIG 4.0 6F (CATHETERS) IMPLANT
DEVICE RAD COMP TR BAND LRG (VASCULAR PRODUCTS) IMPLANT
GLIDESHEATH SLEND SS 6F .021 (SHEATH) IMPLANT
GUIDEWIRE .025 260CM (WIRE) IMPLANT
KIT HEART LEFT (KITS) ×1 IMPLANT
PACK CARDIAC CATHETERIZATION (CUSTOM PROCEDURE TRAY) ×1 IMPLANT
SHEATH GLIDE SLENDER 4/5FR (SHEATH) IMPLANT
TRANSDUCER W/STOPCOCK (MISCELLANEOUS) ×1 IMPLANT
TUBING CIL FLEX 10 FLL-RA (TUBING) ×1 IMPLANT
WIRE EMERALD 3MM-J .035X260CM (WIRE) IMPLANT

## 2022-12-16 NOTE — Discharge Instructions (Addendum)
Restart Warfarin tonight 12/16/22 Start taking your lasix twice a day

## 2022-12-16 NOTE — Interval H&P Note (Signed)
History and Physical Interval Note:  12/16/2022 7:06 AM  Robert [name]  has presented today for surgery, with the diagnosis of aortic valve disease.  The various methods of treatment have been discussed with the patient and family. After consideration of risks, benefits and other options for treatment, the patient has consented to  Procedure(s): RIGHT/LEFT HEART CATH AND CORONARY ANGIOGRAPHY (N/A) as a surgical intervention.  The patient's history has been reviewed, patient examined, no change in status, stable for surgery.  I have reviewed the patient's chart and labs.  Questions were answered to the patient's satisfaction.     [name]

## 2022-12-17 ENCOUNTER — Encounter (HOSPITAL_COMMUNITY): Payer: Self-pay | Admitting: Internal Medicine

## 2022-12-17 DIAGNOSIS — H34831 Tributary (branch) retinal vein occlusion, right eye, with macular edema: Secondary | ICD-10-CM | POA: Diagnosis not present

## 2022-12-22 ENCOUNTER — Ambulatory Visit (HOSPITAL_COMMUNITY)
Admission: RE | Admit: 2022-12-22 | Discharge: 2022-12-22 | Disposition: A | Discharge status: Home / Self Care | Payer: MEDICARE | Source: Ambulatory Visit | Attending: Internal Medicine | Admitting: Internal Medicine

## 2022-12-22 DIAGNOSIS — I700 Atherosclerosis of aorta: Secondary | ICD-10-CM | POA: Diagnosis not present

## 2022-12-22 DIAGNOSIS — N289 Disorder of kidney and ureter, unspecified: Secondary | ICD-10-CM | POA: Diagnosis not present

## 2022-12-22 DIAGNOSIS — Z01818 Encounter for other preprocedural examination: Secondary | ICD-10-CM | POA: Diagnosis not present

## 2022-12-22 DIAGNOSIS — I2510 Atherosclerotic heart disease of native coronary artery without angina pectoris: Secondary | ICD-10-CM | POA: Diagnosis not present

## 2022-12-22 DIAGNOSIS — I350 Nonrheumatic aortic (valve) stenosis: Secondary | ICD-10-CM | POA: Diagnosis not present

## 2022-12-22 DIAGNOSIS — R590 Localized enlarged lymph nodes: Secondary | ICD-10-CM | POA: Insufficient documentation

## 2022-12-22 DIAGNOSIS — I517 Cardiomegaly: Secondary | ICD-10-CM | POA: Diagnosis not present

## 2022-12-22 MED ORDER — IOHEXOL 350 MG/ML SOLN
100.0000 mL | Freq: Once | INTRAVENOUS | Status: AC | PRN
  Administered 2022-12-22: 100 mL via INTRAVENOUS

## 2022-12-22 NOTE — Progress Notes (Signed)
Called and spoke to Robert [name]. Reviewed with her patient's heart rate range of 88-110 bpm in a-fib.  She said she will defer to Robert [name] in [place].  Robert [name] came and looked at patient's rate/rhythm and believes he can get a good scan.   Patient reports with lopressor and cardizem patient has anaphylactic reactions so we are unable to give medications. Patient also reports he drank a cup of coffee this morning prior to coming in.

## 2022-12-23 ENCOUNTER — Ambulatory Visit (INDEPENDENT_AMBULATORY_CARE_PROVIDER_SITE_OTHER): Payer: MEDICARE

## 2022-12-23 DIAGNOSIS — Z7901 Long term (current) use of anticoagulants: Secondary | ICD-10-CM | POA: Diagnosis not present

## 2022-12-23 LAB — POCT INR: INR: 1.5 — AB (ref 2.0–3.0)

## 2022-12-23 NOTE — Progress Notes (Addendum)
Pt restarted warfarin on 6/26, was on a 5 day hold, and denies any changes. Pt reports he may have aortic valve replacement this month. He is seeing cardiology on 7/15 to discuss surgery. Pt reports cardiology is talking about having surgery on 7/16. They will manage warfarin around surgery if pt is having surgery. Advised if surgery is on 7/16 no need for coumadin clinic apt. Advised to keep coumadin clinic aware of surgery date.  Pt has been therapeutic on this dose for several months so not weekly change will be made to dosing.  Increase dose today to take 1 tablet and the continue 1/2 tablet daily except take 1 tablet on Fridays.  Recheck in 2 weeks.   Medical screening examination/treatment/procedure(s) were performed by non-physician practitioner and as supervising physician I was immediately available for consultation/collaboration.  I agree with above. Jacinta Shoe, MD

## 2022-12-23 NOTE — Patient Instructions (Addendum)
Pre visit review using our clinic review tool, if applicable. No additional management support is needed unless otherwise documented below in the visit note.  Increase dose today to take 1 tablet and the continue 1/2 tablet daily except take 1 tablet on Fridays.  Recheck in 2 weeks.

## 2022-12-24 ENCOUNTER — Encounter: Payer: Self-pay | Admitting: Physician Assistant

## 2022-12-24 ENCOUNTER — Other Ambulatory Visit: Payer: Self-pay | Admitting: Physician Assistant

## 2022-12-24 DIAGNOSIS — I350 Nonrheumatic aortic (valve) stenosis: Secondary | ICD-10-CM

## 2022-12-29 NOTE — Telephone Encounter (Signed)
Pt contacted coumadin clinic to make aware of upcoming TAVR surgery on 7/16. Cardiology has advised pt to hold warfarin for 5 days prior to surgery with 6/10 being last dose taken before surgery. He was not advised of any lovenox bridge. He was advised to contact coumadin clinic to make apt for INR check one week after surgery. Scheduled pt for one week after surgery. Pt requested a msg be sent to PCP to make sure he is aware of his upcoming surgery. Advised a msg would be sent and if anything is needed to contact the office. Pt verbalized understanding.

## 2023-01-02 ENCOUNTER — Other Ambulatory Visit: Payer: Self-pay

## 2023-01-02 ENCOUNTER — Ambulatory Visit (HOSPITAL_COMMUNITY)
Admission: RE | Admit: 2023-01-02 | Discharge: 2023-01-02 | Disposition: A | Discharge status: Home / Self Care | Payer: MEDICARE | Source: Ambulatory Visit | Attending: Physician Assistant | Admitting: Physician Assistant

## 2023-01-02 ENCOUNTER — Encounter (HOSPITAL_COMMUNITY)
Admission: RE | Admit: 2023-01-02 | Discharge: 2023-01-02 | Disposition: A | Discharge status: Home / Self Care | Payer: MEDICARE | Source: Ambulatory Visit | Attending: Internal Medicine | Admitting: Internal Medicine

## 2023-01-02 DIAGNOSIS — I350 Nonrheumatic aortic (valve) stenosis: Secondary | ICD-10-CM | POA: Insufficient documentation

## 2023-01-02 DIAGNOSIS — Z01818 Encounter for other preprocedural examination: Secondary | ICD-10-CM | POA: Insufficient documentation

## 2023-01-02 DIAGNOSIS — I517 Cardiomegaly: Secondary | ICD-10-CM | POA: Diagnosis not present

## 2023-01-02 DIAGNOSIS — Z1152 Encounter for screening for COVID-19: Secondary | ICD-10-CM | POA: Insufficient documentation

## 2023-01-02 LAB — CBC
HCT: 49.2 % (ref 39.0–52.0)
Hemoglobin: 16.9 g/dL (ref 13.0–17.0)
MCH: 33 pg (ref 26.0–34.0)
MCHC: 34.3 g/dL (ref 30.0–36.0)
MCV: 96.1 fL (ref 80.0–100.0)
Platelets: 264 10*3/uL (ref 150–400)
RBC: 5.12 MIL/uL (ref 4.22–5.81)
RDW: 12.9 % (ref 11.5–15.5)
WBC: 11.2 10*3/uL — ABNORMAL HIGH (ref 4.0–10.5)
nRBC: 0 % (ref 0.0–0.2)

## 2023-01-02 LAB — TYPE AND SCREEN
ABO/RH(D): O POS
Antibody Screen: NEGATIVE

## 2023-01-02 LAB — URINALYSIS, ROUTINE W REFLEX MICROSCOPIC
Bilirubin Urine: NEGATIVE
Glucose, UA: NEGATIVE mg/dL
Hgb urine dipstick: NEGATIVE
Ketones, ur: NEGATIVE mg/dL
Leukocytes,Ua: NEGATIVE
Nitrite: NEGATIVE
Protein, ur: NEGATIVE mg/dL
Specific Gravity, Urine: 1.003 — ABNORMAL LOW (ref 1.005–1.030)
pH: 6 (ref 5.0–8.0)

## 2023-01-02 LAB — COMPREHENSIVE METABOLIC PANEL
ALT: 23 U/L (ref 0–44)
AST: 25 U/L (ref 15–41)
Albumin: 3.7 g/dL (ref 3.5–5.0)
Alkaline Phosphatase: 67 U/L (ref 38–126)
Anion gap: 11 (ref 5–15)
BUN: 12 mg/dL (ref 8–23)
CO2: 25 mmol/L (ref 22–32)
Calcium: 9.1 mg/dL (ref 8.9–10.3)
Chloride: 101 mmol/L (ref 98–111)
Creatinine, Ser: 1.26 mg/dL — ABNORMAL HIGH (ref 0.61–1.24)
GFR, Estimated: >60 mL/min (ref >60)
Glucose, Bld: 101 mg/dL — ABNORMAL HIGH (ref 70–99)
Potassium: 3.5 mmol/L (ref 3.5–5.1)
Sodium: 137 mmol/L (ref 135–145)
Total Bilirubin: 1.4 mg/dL — ABNORMAL HIGH (ref 0.3–1.2)
Total Protein: 8.2 g/dL — ABNORMAL HIGH (ref 6.5–8.1)

## 2023-01-02 LAB — SARS CORONAVIRUS 2 (TAT 6-24 HRS): SARS Coronavirus 2: NEGATIVE

## 2023-01-02 LAB — PROTIME-INR
INR: 1.6 — ABNORMAL HIGH (ref 0.8–1.2)
Prothrombin Time: 19.4 seconds — ABNORMAL HIGH (ref 11.4–15.2)

## 2023-01-02 LAB — SURGICAL PCR SCREEN
MRSA, PCR: NEGATIVE
Staphylococcus aureus: POSITIVE — AB

## 2023-01-02 NOTE — Progress Notes (Signed)
Patient signed all consents at PAT lab appointment. CHG soap and instructions were given to patient. CHG surgical prep reviewed with patient and all questions answered.  Patients chart send to anesthesia for review.  

## 2023-01-02 NOTE — Progress Notes (Signed)
On Blood Bank paperwork, pt noted a reaction to a transfusion. February 03, 2003 pt underwent an I&D of right finger abscess with Dr. Merlyn Lot Sr. He received a unit of FFP to reverse his coumadin. FFP transfusion resulted in face swelling and throat swelling per pt, but wife states it was just his face. Pt was given IV Benadryl in the recovery room and pts reaction resolved with no additional issues. No issues with surgery noted as well.  Linda with the Blood Bank notified of all information.

## 2023-01-05 ENCOUNTER — Telehealth: Payer: Self-pay | Admitting: Internal Medicine

## 2023-01-05 ENCOUNTER — Encounter: Payer: MEDICARE | Admitting: Thoracic Surgery (Cardiothoracic Vascular Surgery)

## 2023-01-05 MED ORDER — NOREPINEPHRINE 4 MG/250ML-% IV SOLN
0.0000 ug/min | INTRAVENOUS | Status: AC
  Administered 2023-01-06: 2 ug/min via INTRAVENOUS
  Filled 2023-01-05: qty 250

## 2023-01-05 MED ORDER — MAGNESIUM SULFATE 50 % IJ SOLN
40.0000 meq | INTRAMUSCULAR | Status: DC
  Filled 2023-01-05: qty 9.85

## 2023-01-05 MED ORDER — POTASSIUM CHLORIDE 2 MEQ/ML IV SOLN
80.0000 meq | INTRAVENOUS | Status: DC
  Filled 2023-01-05: qty 40

## 2023-01-05 MED ORDER — CEFAZOLIN IN SODIUM CHLORIDE 3-0.9 GM/100ML-% IV SOLN
3.0000 g | INTRAVENOUS | Status: AC
  Administered 2023-01-06: 3 g via INTRAVENOUS
  Filled 2023-01-05 (×2): qty 100

## 2023-01-05 MED ORDER — HEPARIN 30,000 UNITS/1000 ML (OHS) CELLSAVER SOLUTION
Status: DC
  Filled 2023-01-05: qty 1000

## 2023-01-05 MED ORDER — DEXMEDETOMIDINE HCL IN NACL 400 MCG/100ML IV SOLN
0.1000 ug/kg/h | INTRAVENOUS | Status: AC
  Administered 2023-01-06: 72.12 ug via INTRAVENOUS
  Administered 2023-01-06: .5 ug/kg/h via INTRAVENOUS
  Filled 2023-01-05: qty 100

## 2023-01-05 NOTE — H&P (View-Only) (Signed)
[address]       [place] [address]             [phone]           [facility] Medical Record #[id number] Date of Birth: 12/14/1950  No ref. provider found [name], Georgina Quint, MD  Chief Complaint:   Increasing SOB  History of Present Illness:     Pt is a 72 yo male who over the past 6 months has developed increasing DOE with little activity. This is associated with chest pressure and lightheadedness. His symptoms have increased after he contracted COVID earlier this year. He was sent for a stress test without ischemia however TTE with preserved LV function (EF 55-60%) and severe AS with a mean gradient of and a valve area of 0.72cm2. Pt was evaluated with cath without CAD and CTA with acceptable anatomy for TAVR. Pt has atrial fibrillation and on coumadin. Pt prefers not to have open heart surgery.      Past Medical History:  Diagnosis Date   Anxiety    Asthma    "no attack since acupuncture in 1990's"   Complication of anesthesia    SLOW TO WAKE UP / DIFFICULTY RESPONDING 2003, 3 days before could use left arm, "wild/crazy sometimes"   Cyst of left kidney    "benign"   DVT (deep venous thrombosis) (HCC) 2003   right femoral artery "from groin to knee"   Edema    Right Leg w/ h/o DVT postphlebitic   H/O blood transfusion reaction 2004   FFP, extreme swelling, could not breath   Headache(784.0)    MIGRAINES-not for a long time   History of pulmonary embolism 2003   both lungs   Hyperlipidemia    Knee pain    left   LBP (low back pain)    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, clear in 2012"   Obesity    Osteoarthritis    Peripheral vascular disease (HCC)    "poor circulation in  RT leg"   PONV (postoperative nausea and vomiting)    during surgery in 1990's   Spondylolisthesis    Warfarin anticoagulation    Wheezing    when laying on left side    Past Surgical History:  Procedure Laterality Date    APPENDECTOMY  1993   Dr Jamey Ripa   CHOLECYSTECTOMY  1993   FINGER SURGERY  2012   RT THUMB RECONSTRUCTION   HAND SURGERY Right    abcess   HEMORROIDECTOMY  1981   I & D KNEE WITH POLY EXCHANGE Left 04/04/2013   Procedure: IRRIGATION AND DEBRIDEMENT KNEE WITH POLY EXCHANGE AND INSERTION OF CEMENT BEADS;  Surgeon: Loanne Drilling, MD;  Location: [facility];  Service: Orthopedics;  Laterality: Left;   KNEE ARTHROSCOPY  1990   RT KNEE   KNEE ARTHROSCOPY Right 2003   KNEE ARTHROSCOPY  1988   L KNEE   KNEE ARTHROSCOPY WITH MENISCAL REPAIR Left 10/22/2012   Procedure: LEFT KNEE ARTHROSCOPY PARTIAL MEDIAL AND LATERAL MENISCECTOMY AND DEBRIDEMENT, CHONDROPLASTY ;  Surgeon: Javier Docker, MD;  Location: [facility];  Service: Orthopedics;  Laterality: Left;   LUMBAR DISC SURGERY     LUMBAR FUSION  2003   Dr Jillyn Hidden   ORCHIECTOMY  1994   R post injury   RIGHT HEART CATH AND CORONARY ANGIOGRAPHY N/A 12/16/2022   Procedure: RIGHT HEART CATH AND CORONARY ANGIOGRAPHY;  Surgeon: Alverda Skeans  K, MD;  Location: [facility];  Service: Cardiovascular;  Laterality: N/A;   TONSILLECTOMY  1967   TOTAL KNEE ARTHROPLASTY Left 03/21/2013   Procedure: LEFT TOTAL KNEE ARTHROPLASTY;  Surgeon: Loanne Drilling, MD;  Location: [facility];  Service: Orthopedics;  Laterality: Left;    Social History   Tobacco Use  Smoking Status Never  Smokeless Tobacco Never    Social History   Substance and Sexual Activity  Alcohol Use Never    Social History   Socioeconomic History   Marital status: Married    Spouse name: Not on file   Number of children: 3   Years of education: Not on file   Highest education level: Bachelor's degree (e.g., BA, AB, BS)  Occupational History   Occupation: Disabled    Employer: DISABLED  Tobacco Use   Smoking status: Never   Smokeless tobacco: Never  Vaping Use   Vaping status: Never Used  Substance and Sexual Activity   Alcohol use: Never   Drug use: Never   Sexual activity: Yes   Other Topics Concern   Not on file  Social History Narrative   Right handed   One story home   Drinks caffeine coffee q am   Social Determinants of Health   Financial Resource Strain: Low Risk  (01/20/2022)   Overall Financial Resource Strain (CARDIA)    Difficulty of Paying Living Expenses: Not hard at all  Food Insecurity: Patient Declined (10/13/2022)   Hunger Vital Sign    Worried About Running Out of Food in the Last Year: Patient declined    Ran Out of Food in the Last Year: Patient declined  Transportation Needs: No Transportation Needs (10/13/2022)   PRAPARE - Administrator, Civil Service (Medical): No    Lack of Transportation (Non-Medical): No  Physical Activity: Insufficiently Active (10/13/2022)   Exercise Vital Sign    Days of Exercise per Week: 3 days    Minutes of Exercise per Session: 30 min  Stress: No Stress Concern Present (01/20/2022)   [employer] of Occupational Health - Occupational Stress Questionnaire    Feeling of Stress : Not at all  Social Connections: Unknown (10/13/2022)   Social Connection and Isolation Panel [NHANES]    Frequency of Communication with Friends and Family: Not on file    Frequency of Social Gatherings with Friends and Family: Not on file    Attends Religious Services: Patient declined    Active Member of Clubs or Organizations: Not on file    Attends Banker Meetings: Not on file    Marital Status: Married  Intimate Partner Violence: Unknown (04/06/2022)   Received from [employer], [facility]   HITS    Physically Hurt: Not on file    Insult or Talk Down To: Not on file    Threaten Physical Harm: Not on file    Scream or Curse: Not on file    Allergies  Allergen Reactions   Cardizem [Diltiazem] Anaphylaxis   Cat Hair Extract Itching   Lopressor [Metoprolol Tartrate] Anaphylaxis    Asthmatic issues    Sulfa Antibiotics Hives and Swelling   Amiodarone     Hand swelling   Anoro Ellipta  [Umeclidinium-Vilanterol]     Too much mucus   Breo Ellipta [Fluticasone Furoate-Vilanterol]     Too much mucus   Exalgo [Hydromorphone Hcl] Nausea Only    Sick and nauseated   Gabapentin     Made pt feel out of  it    Hydrocodone-Acetaminophen Nausea Only    REACTION: nausea   Penicillins Hives    Tolerated Cefazolin 04/07/13, S.Andrey Campanile, PharmD   Sulfonamide Derivatives Hives and Swelling   Tylenol [Acetaminophen]     Liver issues    Current Facility-Administered Medications  Medication Dose Route Frequency Provider Last Rate Last Admin   [START ON 01/06/2023] ceFAZolin (ANCEF) IVPB 3g/100 mL premix  3 g Intravenous To OR [name], MD       [START ON 01/06/2023] dexmedetomidine (PRECEDEX) 400 MCG/100ML (4 mcg/mL) infusion  0.1-0.7 mcg/kg/hr Intravenous To OR [name], MD       [START ON 01/06/2023] heparin 30,000 units/NS 1000 mL solution for CELLSAVER   Other To OR [name], MD       [START ON 01/06/2023] magnesium sulfate (IV Push/IM) injection 40 mEq  40 mEq Other To OR [name], MD       [START ON 01/06/2023] norepinephrine (LEVOPHED) 4mg  in (0.016 mg/mL) premix infusion  0-10 mcg/min Intravenous To OR [name], MD       [START ON 01/06/2023] potassium chloride injection 80 mEq  80 mEq Other To OR [name], MD       Current Outpatient Medications  Medication Sig Dispense Refill   albuterol (VENTOLIN HFA) 108 (90 Base) MCG/ACT inhaler Inhale 2 puffs into the lungs every 6 (six) hours as needed for wheezing or shortness of breath. 6.7 g 0   allopurinol (ZYLOPRIM) 100 MG tablet Take 1 tablet (100 mg total) by mouth daily. 90 tablet 1   celecoxib (CELEBREX) 200 MG capsule Take 1 capsule (200 mg total) by mouth 2 (two) times daily. (Patient taking differently: Take 200 mg by mouth 2 (two) times daily as needed for moderate pain.) 14 capsule 0   cetirizine (ZYRTEC) 10 MG tablet Take 1 tablet by mouth once daily (Patient taking differently:  Take 10 mg by mouth at bedtime.) 30 tablet 5   Cholecalciferol (VITAMIN D3) 125 MCG (5000 UT) CAPS Take 1 capsule (5,000 Units total) by mouth daily. 100 capsule 3   colchicine 0.6 MG tablet Take two tablets prn gout attack.Then take another one in 1-2 hrs. Do not repeat for 3 days. 18 tablet 1   famotidine (PEPCID) 40 MG tablet Take 1 tablet by mouth once daily (Patient taking differently: Take 40 mg by mouth daily as needed for heartburn or indigestion.) 30 tablet 5   fish oil-omega-3 fatty acids 1000 MG capsule Take 1 g by mouth every morning.     furosemide (LASIX) 20 MG tablet Take 1 tablet (20 mg total) by mouth 2 (two) times daily. 90 tablet 3   glucosamine-chondroitin 500-400 MG tablet Take 1 tablet by mouth every morning.     nortriptyline (PAMELOR) 25 MG capsule TAKE 1 CAPSULE AT BEDTIME 90 capsule 3   predniSONE (DELTASONE) 5 MG tablet TAKE 1 TABLET DAILY WITH BREAKFAST 90 tablet 3   pregabalin (LYRICA) 75 MG capsule Take 1 capsule (75 mg total) by mouth 3 (three) times daily as needed. 90 capsule 3   triamcinolone cream (KENALOG) 0.1 % Apply 1 Application topically 3 (three) times daily. (Patient taking differently: Apply 1 Application topically daily as needed (irritation).) 450 g 1   warfarin (COUMADIN) 3 MG tablet TAKE 1 TABLET DAILY OR TAKE AS DIRECTED BY ANTICOAGULATION CLINIC (Patient taking differently: Take 3 mg by mouth See admin instructions. TAKE 1 TABLET DAILY OR TAKE AS DIRECTED BY ANTICOAGULATION CLINIC; 6  mg (6 mg x 1) every Fri; 3 mg (6 mg x 0.5) all other days) 90 tablet 1   warfarin (COUMADIN) 6 MG tablet TAKE 1/2 TABLET BY MOUTH DAILY EXCEPT TAKE 1 TABLET ON FRIDAYS OR AS DIRECTED BY ANTICOAGULATION CLINIC (Patient taking differently: Take 6 mg by mouth See admin instructions. TAKE 1/2 TABLET BY MOUTH DAILY EXCEPT TAKE 1 TABLET ON FRIDAYS OR AS DIRECTED BY ANTICOAGULATION CLINIC; 6 mg (6 mg x 1) every Fri; 3 mg (6 mg x 0.5) all other days) 60 tablet 1     Family History   Problem Relation Age of Onset   Heart disease Mother        CABG at age 24   Hyperlipidemia Other    Hypertension Other    Parkinsonism Other    Heart disease Sister        Physical Exam: Wt (!) 144.2 kg Comment: wt from 12/16/22  BMI 44.34 kg/m  Lungs: overall clear Card: IRR with high pitched sem Ext: trace edema General: obese    Diagnostic Studies & Laboratory data: I have personally reviewed the following studies and agree with the findings   TTE (10/2022) IMPRESSIONS     1. The aortic valve is tricuspid. There is severe calcifcation of the  aortic valve. There is severe thickening of the aortic valve. Aortic valve  regurgitation is mild. Severe aortic valve stenosis. Aortic valve area, by  VTI measures 0.72 cm. Aortic  valve mean gradient measures 40.0 mmHg. Aortic valve Vmax measures 3.82  m/s. DI 0.23.   2. Left ventricular ejection fraction, by estimation, is 55 to 60%. The  left ventricle has normal function. The left ventricle has no regional  wall motion abnormalities. There is mild concentric left ventricular  hypertrophy. Left ventricular diastolic  parameters are indeterminate.   3. Right ventricular systolic function is normal. The right ventricular  size is normal. There is normal pulmonary artery systolic pressure.   4. Left atrial size was moderately dilated.   5. Right atrial size was mildly dilated.   6. The mitral valve is grossly normal. Mild mitral valve regurgitation.   Comparison(s): Compared to prior TTE in 2021, the AS is now severe (prior  mean gradient 19.3, AVA 1.26cm2).   FINDINGS   Left Ventricle: Left ventricular ejection fraction, by estimation, is 55  to 60%. The left ventricle has normal function. The left ventricle has no  regional wall motion abnormalities. The left ventricular internal cavity  size was normal in size. There is   mild concentric left ventricular hypertrophy. Left ventricular diastolic  parameters are  indeterminate.   Right Ventricle: The right ventricular size is normal. Right vetricular  wall thickness was not well visualized. Right ventricular systolic  function is normal. There is normal pulmonary artery systolic pressure.  The tricuspid regurgitant velocity is 2.69  m/s, and with an assumed right atrial pressure of 3 mmHg, the estimated  right ventricular systolic pressure is 31.9 mmHg.   Left Atrium: Left atrial size was moderately dilated.   Right Atrium: Right atrial size was mildly dilated.   Pericardium: There is no evidence of pericardial effusion.   Mitral Valve: The mitral valve is grossly normal. Mild mitral valve  regurgitation.   Tricuspid Valve: The tricuspid valve is normal in structure. Tricuspid  valve regurgitation is mild.   Aortic Valve: The aortic valve is tricuspid. There is severe calcifcation  of the aortic valve. There is severe thickening of the aortic valve.  Aortic  valve regurgitation is mild. Severe aortic stenosis is present.  Aortic valve mean gradient measures 40.0   mmHg. Aortic valve peak gradient measures 58.4 mmHg. Aortic valve area,  by VTI measures 0.72 cm.   Pulmonic Valve: The pulmonic valve was normal in structure. Pulmonic valve  regurgitation is trivial.   Aorta: The aortic root is normal in size and structure.   IAS/Shunts: The atrial septum is grossly normal.     LEFT VENTRICLE  PLAX 2D  LVIDd:         5.70 cm   Diastology  LVIDs:         4.00 cm   LV e' medial:   9.40 cm/s  LV PW:         1.40 cm   LV E/e' medial: 13.1  LV IVS:        1.10 cm  LVOT diam:     2.10 cm  LV SV:         58  LV SV Index:   23  LVOT Area:     3.46 cm     RIGHT VENTRICLE  RV Basal diam:  3.90 cm  RV S prime:     11.60 cm/s  TAPSE (M-mode): 2.3 cm  RVSP:           31.9 mmHg   LEFT ATRIUM              Index        RIGHT ATRIUM           Index  LA diam:        4.60 cm  1.79 cm/m   RA Pressure: 3.00 mmHg  LA Vol (A2C):   118.0 ml 46.01  ml/m  RA Area:     24.10 cm  LA Vol (A4C):   81.1 ml  31.62 ml/m  RA Volume:   74.60 ml  29.09 ml/m  LA Biplane Vol: 99.8 ml  38.92 ml/m   AORTIC VALVE  AV Area (Vmax):    0.75 cm  AV Area (Vmean):   0.65 cm  AV Area (VTI):     0.72 cm  AV Vmax:           382.00 cm/s  AV Vmean:          305.000 cm/s  AV VTI:            0.802 m  AV Peak Grad:      58.4 mmHg  AV Mean Grad:      40.0 mmHg  LVOT Vmax:         82.20 cm/s  LVOT Vmean:        57.267 cm/s  LVOT VTI:          0.167 m  LVOT/AV VTI ratio: 0.21    AORTA  Ao Root diam: 3.60 cm  Ao Asc diam:  3.60 cm   MITRAL VALVE                TRICUSPID VALVE  MV Area (PHT):              TR Peak grad:   28.9 mmHg  MV Decel Time:              TR Vmax:        269.00 cm/s  MV E velocity: 123.67 cm/s  Estimated RAP:  3.00 mmHg  RVSP:           31.9 mmHg                                SHUNTS                              Systemic VTI:  0.17 m                              Systemic Diam: 2.10 cm  CATH (11/2022) Conclusion      Ost RCA to Prox RCA lesion is 20% stenosed.   Mid LAD lesion is 40% stenosed.   1.  Mild obstructive coronary artery disease. 2.  Fick cardiac output of 7.5 L/min and Fick cardiac index of 2.9 L/min/m with the following hemodynamics: Right atrial pressure mean of 15 mmHg Right ventricular pressure 47/8 with end-diastolic pressure of 15 mmHg Wedge pressure mean 23 mmHg PA pressure 46/23 with a mean of 34 mmHg.    Recent Radiology Findings:   CTA (11/2022) FINDINGS: Image quality: Average, reduced CNR.  Reduced SNR.   Aortic Valve:   Tricuspid aortic valve with severely reduced cusp excursion. Severely thickened and severely calcified aortic valve cusps.   AV calcium score: 2591   Virtual Basal Annulus Measurements:   Maximum/Minimum Diameter: 29.8 x 23.2 mm   Perimeter:  85 mm   Area:  552 mm2   No significant LVOT calcifications.   Membranous septal length: 6.7  mm   Based on these measurements, the annulus would be suitable for a 29 mm Sapien 3 valve. Alternatively, Heart Team can consider 34 mm Evolut valve. Recommend Heart Team discussion for valve selection.   Sinus of Valsalva Measurements:   Non-coronary:  37 mm   Right - coronary:  36 mm   Left - coronary:  36 mm   Sinus of Valsalva Height:   Left: 27.3 mm   Right: 25.3 mm   Aorta: Short segment common origin of brachiocephalic and left common carotid arteries off of aortic arch.   Sinotubular Junction:  31 mm   Ascending Thoracic Aorta:  36 mm   Aortic Arch:  31 mm   Descending Thoracic Aorta:  27 mm   Coronary Artery Height above Annulus:   Left main: 16.8 mm   Right coronary: 19.3 mm   Coronary Arteries: Normal coronary origin. Right dominance. The study was performed without use of NTG and insufficient for plaque evaluation. Coronary artery calcium seen in 3 vessel distribution.   Optimum Fluoroscopic Angle for Delivery:  LAO 3 CAU 3   OTHER:   Atria:   Left atrial appendage: No thrombus.   Mitral valve: Grossly normal, mild mitral annular calcifications.   Pulmonary artery: Normal caliber.   Pulmonary veins: Normal anatomy.   IMPRESSION: 1. 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 Severely thickened and severely calcified aortic valve cusps. 2. Aortic valve calcium score: 2591 3. Annulus area: 552 mm2, suitable for 29 mm Sapien 3 valve. No LVOT calcifications. Membranous septal length 6.7 mm. 4. Sufficient coronary artery heights from annulus. 5. Optimum fluoroscopic angle for delivery: LAO 3 CAU 3      Recent Lab Findings: Lab Results  Component Value Date   WBC 11.2 (H) 01/02/2023   HGB 16.9 01/02/2023   HCT 49.2 01/02/2023  PLT 264 01/02/2023   GLUCOSE 101 (H) 01/02/2023   CHOL 191 03/17/2022   TRIG 168.0 (H) 03/17/2022   HDL 36.20 (L) 03/17/2022   LDLDIRECT 137.0 03/10/2017   LDLCALC 121 (H) 03/17/2022   ALT  23 01/02/2023   AST 25 01/02/2023   NA 137 01/02/2023   K 3.5 01/02/2023   CL 101 01/02/2023   CREATININE 1.26 (H) 01/02/2023   BUN 12 01/02/2023   CO2 25 01/02/2023   TSH 4.49 03/17/2022   INR 1.6 (H) 01/02/2023   HGBA1C 5.8 11/12/2021      Assessment / Plan:     72 yo male with increasing DOE, chest pressure and lightheadedness and with severe AS with no CAD and preserved LV function. He has NYHA class 2 symptoms attributable to the AS and should proceed with AVR. With his age, afib, obesity, lung disease he is at increased risk for SAVR and prefers to not have open heart surgery. Therefore, he is an acceptable candidate for TAVR and agree to proceed via femoral access and medtonic valve. He is a candidate for bailout if needed at time of TAVR. All the risks and benefits were discussed with pt and wife and they understand and wish to proceed with plan   I have spent 60 min in review of the records, viewing studies and in face to face with patient and in coordination of future care    Eugenio Hoes 01/05/2023 9:06 PM

## 2023-01-05 NOTE — Consult Note (Incomplete)
[address]       [place] [address]             [phone]           Robert [name] [facility] Medical Record #[id number] Date of Birth: 12-30-1950  No ref. provider found [name], Georgina Quint, MD  Chief Complaint:   No chief complaint on file.   History of Present Illness:           Past Medical History:  Diagnosis Date   Anxiety    Asthma    "no attack since acupuncture in 1990's"   Complication of anesthesia    SLOW TO WAKE UP / DIFFICULTY RESPONDING 2003, 3 days before could use left arm, "wild/crazy sometimes"   Cyst of left kidney    "benign"   DVT (deep venous thrombosis) (HCC) 2003   right femoral artery "from groin to knee"   Edema    Right Leg w/ h/o DVT postphlebitic   H/O blood transfusion reaction 2004   FFP, extreme swelling, could not breath   Headache(784.0)    MIGRAINES-not for a long time   History of pulmonary embolism 2003   both lungs   Hyperlipidemia    Knee pain    left   LBP (low back pain)    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, clear in 2012"   Obesity    Osteoarthritis    Peripheral vascular disease (HCC)    "poor circulation in  RT leg"   PONV (postoperative nausea and vomiting)    during surgery in 1990's   Spondylolisthesis    Warfarin anticoagulation    Wheezing    when laying on left side    Past Surgical History:  Procedure Laterality Date   APPENDECTOMY  1993   Dr Jamey Ripa   CHOLECYSTECTOMY  1993   FINGER SURGERY  2012   RT THUMB RECONSTRUCTION   HAND SURGERY Right    abcess   HEMORROIDECTOMY  1981   I & D KNEE WITH POLY EXCHANGE Left 04/04/2013   Procedure: IRRIGATION AND DEBRIDEMENT KNEE WITH POLY EXCHANGE AND INSERTION OF CEMENT BEADS;  Surgeon: Loanne Drilling, MD;  Location: [facility];  Service: Orthopedics;  Laterality: Left;   KNEE ARTHROSCOPY  1990   RT KNEE   KNEE ARTHROSCOPY Right 2003   KNEE ARTHROSCOPY  1988   L KNEE   KNEE ARTHROSCOPY WITH MENISCAL REPAIR Left 10/22/2012    Procedure: LEFT KNEE ARTHROSCOPY PARTIAL MEDIAL AND LATERAL MENISCECTOMY AND DEBRIDEMENT, CHONDROPLASTY ;  Surgeon: Javier Docker, MD;  Location: [facility];  Service: Orthopedics;  Laterality: Left;   LUMBAR DISC SURGERY     LUMBAR FUSION  2003   Dr Jillyn Hidden   ORCHIECTOMY  1994   R post injury   RIGHT HEART CATH AND CORONARY ANGIOGRAPHY N/A 12/16/2022   Procedure: RIGHT HEART CATH AND CORONARY ANGIOGRAPHY;  Surgeon: [name], MD;  Location: [facility];  Service: Cardiovascular;  Laterality: N/A;   TONSILLECTOMY  1967   TOTAL KNEE ARTHROPLASTY Left 03/21/2013   Procedure: LEFT TOTAL KNEE ARTHROPLASTY;  Surgeon: Loanne Drilling, MD;  Location: [facility];  Service: Orthopedics;  Laterality: Left;    Social History   Tobacco Use  Smoking Status Never  Smokeless Tobacco Never    Social History   Substance and Sexual Activity  Alcohol Use Never    Social History   Socioeconomic History   Marital status:  Married    Spouse name: Not on file   Number of children: 3   Years of education: Not on file   Highest education level: Bachelor's degree (e.g., BA, AB, BS)  Occupational History   Occupation: Disabled    Employer: DISABLED  Tobacco Use   Smoking status: Never   Smokeless tobacco: Never  Vaping Use   Vaping status: Never Used  Substance and Sexual Activity   Alcohol use: Never   Drug use: Never   Sexual activity: Yes  Other Topics Concern   Not on file  Social History Narrative   Right handed   One story home   Drinks caffeine coffee q am   Social Determinants of Health   Financial Resource Strain: Low Risk  (01/20/2022)   Overall Financial Resource Strain (CARDIA)    Difficulty of Paying Living Expenses: Not hard at all  Food Insecurity: Patient Declined (10/13/2022)   Hunger Vital Sign    Worried About Running Out of Food in the Last Year: Patient declined    Ran Out of Food in the Last Year: Patient declined  Transportation Needs: No Transportation  Needs (10/13/2022)   PRAPARE - Administrator, Civil Service (Medical): No    Lack of Transportation (Non-Medical): No  Physical Activity: Insufficiently Active (10/13/2022)   Exercise Vital Sign    Days of Exercise per Week: 3 days    Minutes of Exercise per Session: 30 min  Stress: No Stress Concern Present (01/20/2022)   [employer] of Occupational Health - Occupational Stress Questionnaire    Feeling of Stress : Not at all  Social Connections: Unknown (10/13/2022)   Social Connection and Isolation Panel [NHANES]    Frequency of Communication with Friends and Family: Not on file    Frequency of Social Gatherings with Friends and Family: Not on file    Attends Religious Services: Patient declined    Active Member of Clubs or Organizations: Not on file    Attends Banker Meetings: Not on file    Marital Status: Married  Intimate Partner Violence: Unknown (04/06/2022)   Received from [employer], [facility]   HITS    Physically Hurt: Not on file    Insult or Talk Down To: Not on file    Threaten Physical Harm: Not on file    Scream or Curse: Not on file    Allergies  Allergen Reactions   Cardizem [Diltiazem] Anaphylaxis   Cat Hair Extract Itching   Lopressor [Metoprolol Tartrate] Anaphylaxis    Asthmatic issues    Sulfa Antibiotics Hives and Swelling   Amiodarone     Hand swelling   Anoro Ellipta [Umeclidinium-Vilanterol]     Too much mucus   Breo Ellipta [Fluticasone Furoate-Vilanterol]     Too much mucus   Exalgo [Hydromorphone Hcl] Nausea Only    Sick and nauseated   Gabapentin     Made pt feel out of it    Hydrocodone-Acetaminophen Nausea Only    REACTION: nausea   Penicillins Hives    Tolerated Cefazolin 04/07/13, S.Andrey Campanile, PharmD   Sulfonamide Derivatives Hives and Swelling   Tylenol [Acetaminophen]     Liver issues    Current Facility-Administered Medications  Medication Dose Route Frequency Provider Last Rate Last Admin    [START ON 01/06/2023] ceFAZolin (ANCEF) IVPB 3g/100 mL premix  3 g Intravenous To OR [name], MD       [START ON 01/06/2023] dexmedetomidine (PRECEDEX) 400 MCG/100ML (4 mcg/mL) infusion  0.1-0.7 mcg/kg/hr Intravenous To OR [name], MD       [START ON 01/06/2023] heparin 30,000 units/NS 1000 mL solution for CELLSAVER   Other To OR [name], MD       [START ON 01/06/2023] magnesium sulfate (IV Push/IM) injection 40 mEq  40 mEq Other To OR [name], MD       [START ON 01/06/2023] norepinephrine (LEVOPHED) 4mg  in (0.016 mg/mL) premix infusion  0-10 mcg/min Intravenous To OR [name], MD       [START ON 01/06/2023] potassium chloride injection 80 mEq  80 mEq Other To OR [name], MD       Current Outpatient Medications  Medication Sig Dispense Refill   albuterol (VENTOLIN HFA) 108 (90 Base) MCG/ACT inhaler Inhale 2 puffs into the lungs every 6 (six) hours as needed for wheezing or shortness of breath. 6.7 g 0   allopurinol (ZYLOPRIM) 100 MG tablet Take 1 tablet (100 mg total) by mouth daily. 90 tablet 1   celecoxib (CELEBREX) 200 MG capsule Take 1 capsule (200 mg total) by mouth 2 (two) times daily. (Patient taking differently: Take 200 mg by mouth 2 (two) times daily as needed for moderate pain.) 14 capsule 0   cetirizine (ZYRTEC) 10 MG tablet Take 1 tablet by mouth once daily (Patient taking differently: Take 10 mg by mouth at bedtime.) 30 tablet 5   Cholecalciferol (VITAMIN D3) 125 MCG (5000 UT) CAPS Take 1 capsule (5,000 Units total) by mouth daily. 100 capsule 3   colchicine 0.6 MG tablet Take two tablets prn gout attack.Then take another one in 1-2 hrs. Do not repeat for 3 days. 18 tablet 1   famotidine (PEPCID) 40 MG tablet Take 1 tablet by mouth once daily (Patient taking differently: Take 40 mg by mouth daily as needed for heartburn or indigestion.) 30 tablet 5   fish oil-omega-3 fatty acids 1000 MG capsule Take 1 g by mouth every morning.      furosemide (LASIX) 20 MG tablet Take 1 tablet (20 mg total) by mouth 2 (two) times daily. 90 tablet 3   glucosamine-chondroitin 500-400 MG tablet Take 1 tablet by mouth every morning.     nortriptyline (PAMELOR) 25 MG capsule TAKE 1 CAPSULE AT BEDTIME 90 capsule 3   predniSONE (DELTASONE) 5 MG tablet TAKE 1 TABLET DAILY WITH BREAKFAST 90 tablet 3   pregabalin (LYRICA) 75 MG capsule Take 1 capsule (75 mg total) by mouth 3 (three) times daily as needed. 90 capsule 3   triamcinolone cream (KENALOG) 0.1 % Apply 1 Application topically 3 (three) times daily. (Patient taking differently: Apply 1 Application topically daily as needed (irritation).) 450 g 1   warfarin (COUMADIN) 3 MG tablet TAKE 1 TABLET DAILY OR TAKE AS DIRECTED BY ANTICOAGULATION CLINIC (Patient taking differently: Take 3 mg by mouth See admin instructions. TAKE 1 TABLET DAILY OR TAKE AS DIRECTED BY ANTICOAGULATION CLINIC; 6 mg (6 mg x 1) every Fri; 3 mg (6 mg x 0.5) all other days) 90 tablet 1   warfarin (COUMADIN) 6 MG tablet TAKE 1/2 TABLET BY MOUTH DAILY EXCEPT TAKE 1 TABLET ON FRIDAYS OR AS DIRECTED BY ANTICOAGULATION CLINIC (Patient taking differently: Take 6 mg by mouth See admin instructions. TAKE 1/2 TABLET BY MOUTH DAILY EXCEPT TAKE 1 TABLET ON FRIDAYS OR AS DIRECTED BY ANTICOAGULATION CLINIC; 6 mg (6 mg x 1) every Fri; 3 mg (6 mg x 0.5) all other days) 60 tablet 1  Family History  Problem Relation Age of Onset   Heart disease Mother        CABG at age 67   Hyperlipidemia Other    Hypertension Other    Parkinsonism Other    Heart disease Sister        Physical Exam: Wt (!) 144.2 kg Comment: wt from 12/16/22  BMI 44.34 kg/m      Diagnostic Studies & Laboratory data: I have personally reviewed the following studies and agree with the findings   TTE (10/2022) IMPRESSIONS     1. The aortic valve is tricuspid. There is severe calcifcation of the  aortic valve. There is severe thickening of the aortic valve.  Aortic valve  regurgitation is mild. Severe aortic valve stenosis. Aortic valve area, by  VTI measures 0.72 cm. Aortic  valve mean gradient measures 40.0 mmHg. Aortic valve Vmax measures 3.82  m/s. DI 0.23.   2. Left ventricular ejection fraction, by estimation, is 55 to 60%. The  left ventricle has normal function. The left ventricle has no regional  wall motion abnormalities. There is mild concentric left ventricular  hypertrophy. Left ventricular diastolic  parameters are indeterminate.   3. Right ventricular systolic function is normal. The right ventricular  size is normal. There is normal pulmonary artery systolic pressure.   4. Left atrial size was moderately dilated.   5. Right atrial size was mildly dilated.   6. The mitral valve is grossly normal. Mild mitral valve regurgitation.   Comparison(s): Compared to prior TTE in 2021, the AS is now severe (prior  mean gradient 19.3, AVA 1.26cm2).   FINDINGS   Left Ventricle: Left ventricular ejection fraction, by estimation, is 55  to 60%. The left ventricle has normal function. The left ventricle has no  regional wall motion abnormalities. The left ventricular internal cavity  size was normal in size. There is   mild concentric left ventricular hypertrophy. Left ventricular diastolic  parameters are indeterminate.   Right Ventricle: The right ventricular size is normal. Right vetricular  wall thickness was not well visualized. Right ventricular systolic  function is normal. There is normal pulmonary artery systolic pressure.  The tricuspid regurgitant velocity is 2.69  m/s, and with an assumed right atrial pressure of 3 mmHg, the estimated  right ventricular systolic pressure is 31.9 mmHg.   Left Atrium: Left atrial size was moderately dilated.   Right Atrium: Right atrial size was mildly dilated.   Pericardium: There is no evidence of pericardial effusion.   Mitral Valve: The mitral valve is grossly normal. Mild mitral  valve  regurgitation.   Tricuspid Valve: The tricuspid valve is normal in structure. Tricuspid  valve regurgitation is mild.   Aortic Valve: The aortic valve is tricuspid. There is severe calcifcation  of the aortic valve. There is severe thickening of the aortic valve.  Aortic valve regurgitation is mild. Severe aortic stenosis is present.  Aortic valve mean gradient measures 40.0   mmHg. Aortic valve peak gradient measures 58.4 mmHg. Aortic valve area,  by VTI measures 0.72 cm.   Pulmonic Valve: The pulmonic valve was normal in structure. Pulmonic valve  regurgitation is trivial.   Aorta: The aortic root is normal in size and structure.   IAS/Shunts: The atrial septum is grossly normal.     LEFT VENTRICLE  PLAX 2D  LVIDd:         5.70 cm   Diastology  LVIDs:         4.00 cm  LV e' medial:   9.40 cm/s  LV PW:         1.40 cm   LV E/e' medial: 13.1  LV IVS:        1.10 cm  LVOT diam:     2.10 cm  LV SV:         58  LV SV Index:   23  LVOT Area:     3.46 cm     RIGHT VENTRICLE  RV Basal diam:  3.90 cm  RV S prime:     11.60 cm/s  TAPSE (M-mode): 2.3 cm  RVSP:           31.9 mmHg   LEFT ATRIUM              Index        RIGHT ATRIUM           Index  LA diam:        4.60 cm  1.79 cm/m   RA Pressure: 3.00 mmHg  LA Vol (A2C):   118.0 ml 46.01 ml/m  RA Area:     24.10 cm  LA Vol (A4C):   81.1 ml  31.62 ml/m  RA Volume:   74.60 ml  29.09 ml/m  LA Biplane Vol: 99.8 ml  38.92 ml/m   AORTIC VALVE  AV Area (Vmax):    0.75 cm  AV Area (Vmean):   0.65 cm  AV Area (VTI):     0.72 cm  AV Vmax:           382.00 cm/s  AV Vmean:          305.000 cm/s  AV VTI:            0.802 m  AV Peak Grad:      58.4 mmHg  AV Mean Grad:      40.0 mmHg  LVOT Vmax:         82.20 cm/s  LVOT Vmean:        57.267 cm/s  LVOT VTI:          0.167 m  LVOT/AV VTI ratio: 0.21    AORTA  Ao Root diam: 3.60 cm  Ao Asc diam:  3.60 cm   MITRAL VALVE                TRICUSPID VALVE  MV Area  (PHT):              TR Peak grad:   28.9 mmHg  MV Decel Time:              TR Vmax:        269.00 cm/s  MV E velocity: 123.67 cm/s  Estimated RAP:  3.00 mmHg                              RVSP:           31.9 mmHg                                SHUNTS                              Systemic VTI:  0.17 m  Systemic Diam: 2.10 cm  CATH (11/2022) Conclusion      Ost RCA to Prox RCA lesion is 20% stenosed.   Mid LAD lesion is 40% stenosed.   1.  Mild obstructive coronary artery disease. 2.  Fick cardiac output of 7.5 L/min and Fick cardiac index of 2.9 L/min/m with the following hemodynamics: Right atrial pressure mean of 15 mmHg Right ventricular pressure 47/8 with end-diastolic pressure of 15 mmHg Wedge pressure mean 23 mmHg PA pressure 46/23 with a mean of 34 mmHg.    Recent Radiology Findings:   CTA (11/2022) FINDINGS: Image quality: Average, reduced CNR.  Reduced SNR.   Aortic Valve:   Tricuspid aortic valve with severely reduced cusp excursion. Severely thickened and severely calcified aortic valve cusps.   AV calcium score: 2591   Virtual Basal Annulus Measurements:   Maximum/Minimum Diameter: 29.8 x 23.2 mm   Perimeter:  85 mm   Area:  552 mm2   No significant LVOT calcifications.   Membranous septal length: 6.7 mm   Based on these measurements, the annulus would be suitable for a 29 mm Sapien 3 valve. Alternatively, Heart Team can consider 34 mm Evolut valve. Recommend Heart Team discussion for valve selection.   Sinus of Valsalva Measurements:   Non-coronary:  37 mm   Right - coronary:  36 mm   Left - coronary:  36 mm   Sinus of Valsalva Height:   Left: 27.3 mm   Right: 25.3 mm   Aorta: Short segment common origin of brachiocephalic and left common carotid arteries off of aortic arch.   Sinotubular Junction:  31 mm   Ascending Thoracic Aorta:  36 mm   Aortic Arch:  31 mm   Descending Thoracic Aorta:  27 mm    Coronary Artery Height above Annulus:   Left main: 16.8 mm   Right coronary: 19.3 mm   Coronary Arteries: Normal coronary origin. Right dominance. The study was performed without use of NTG and insufficient for plaque evaluation. Coronary artery calcium seen in 3 vessel distribution.   Optimum Fluoroscopic Angle for Delivery:  LAO 3 CAU 3   OTHER:   Atria:   Left atrial appendage: No thrombus.   Mitral valve: Grossly normal, mild mitral annular calcifications.   Pulmonary artery: Normal caliber.   Pulmonary veins: Normal anatomy.   IMPRESSION: 1. 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 Severely thickened and severely calcified aortic valve cusps. 2. Aortic valve calcium score: 2591 3. Annulus area: 552 mm2, suitable for 29 mm Sapien 3 valve. No LVOT calcifications. Membranous septal length 6.7 mm. 4. Sufficient coronary artery heights from annulus. 5. Optimum fluoroscopic angle for delivery: LAO 3 CAU 3      Recent Lab Findings: Lab Results  Component Value Date   WBC 11.2 (H) 01/02/2023   HGB 16.9 01/02/2023   HCT 49.2 01/02/2023   PLT 264 01/02/2023   GLUCOSE 101 (H) 01/02/2023   CHOL 191 03/17/2022   TRIG 168.0 (H) 03/17/2022   HDL 36.20 (L) 03/17/2022   LDLDIRECT 137.0 03/10/2017   LDLCALC 121 (H) 03/17/2022   ALT 23 01/02/2023   AST 25 01/02/2023   NA 137 01/02/2023   K 3.5 01/02/2023   CL 101 01/02/2023   CREATININE 1.26 (H) 01/02/2023   BUN 12 01/02/2023   CO2 25 01/02/2023   TSH 4.49 03/17/2022   INR 1.6 (H) 01/02/2023   HGBA1C 5.8 11/12/2021      Assessment / Plan:  I have spent *** min in review of the records, viewing studies and in face to face with patient and in coordination of future care    Eugenio Hoes 01/05/2023 9:06 PM

## 2023-01-05 NOTE — Telephone Encounter (Signed)
Pt c/o medication issue:  1. Name of Medication:   ceFAZolin (ANCEF) IVPB 3g/100 mL premix    2. How are you currently taking this medication (dosage and times per day)? 3 g, Intravenous, To Surgery @ 200 mL/hr, TAVR Heart Pack Expires 24 hours after hanging.   3. Are you having a reaction (difficulty breathing--STAT)? No  4. What is your medication issue? Pharmacy is wanting to know if he can take the above medication before the procedure tomorrow. Please advise.

## 2023-01-05 NOTE — Anesthesia Preprocedure Evaluation (Signed)
Anesthesia Evaluation  Patient identified by MRN, date of birth, ID band Patient awake    Reviewed: Allergy & Precautions, NPO status , Patient's Chart, lab work & pertinent test results  History of Anesthesia Complications (+) PONV and history of anesthetic complications  Airway Mallampati: II  TM Distance: >3 FB Neck ROM: Full    Dental  (+) Dental Advisory Given, Teeth Intact   Pulmonary asthma , pneumonia, COPD,  COPD inhaler   Pulmonary exam normal breath sounds clear to auscultation       Cardiovascular hypertension, Pt. on medications + Peripheral Vascular Disease  + Valvular Problems/Murmurs AS and AI  Rhythm:Regular Rate:Normal + Systolic murmurs Echo 10/2022  1. The aortic valve is tricuspid. There is severe calcifcation of the aortic valve. There is severe thickening of the aortic valve. Aortic valve regurgitation is mild. Severe aortic valve stenosis. Aortic valve area, by VTI measures 0.72 cm. Aortic valve mean gradient measures 40.0 mmHg. Aortic valve Vmax measures 3.82 m/s. DI 0.23.   2. Left ventricular ejection fraction, by estimation, is 55 to 60%. The left ventricle has normal function. The left ventricle has no regional wall motion abnormalities. There is mild concentric left ventricular hypertrophy. Left ventricular diastolic parameters are indeterminate.   3. Right ventricular systolic function is normal. The right ventricular size is normal. There is normal pulmonary artery systolic pressure.   4. Left atrial size was moderately dilated.   5. Right atrial size was mildly dilated.   6. The mitral valve is grossly normal. Mild mitral valve regurgitation.    Cath 11/2022   Ost RCA to Prox RCA lesion is 20% stenosed.   Mid LAD lesion is 40% stenosed.   1.  Mild obstructive coronary artery disease. 2.  Fick cardiac output of 7.5 L/min and Fick cardiac index of 2.9 L/min/m with the following hemodynamics: Right  atrial pressure mean of 15 mmHg Right ventricular pressure 47/8 with end-diastolic pressure of 15 mmHg Wedge pressure mean 23 mmHg PA pressure 46/23 with a mean of 34 mmHg.   Recommendation: Continue evaluation for aortic valve intervention; increase Lasix to 20 mg twice daily and restart Coumadin tonight.       Neuro/Psych  Headaches PSYCHIATRIC DISORDERS Anxiety        GI/Hepatic negative GI ROS, Neg liver ROS,,,  Endo/Other  Hypothyroidism  Morbid obesity  Renal/GU Renal disease     Musculoskeletal  (+) Arthritis ,    Abdominal  (+) + obese  Peds  Hematology  (+) Blood dyscrasia, anemia   Anesthesia Other Findings   Reproductive/Obstetrics                             Anesthesia Physical Anesthesia Plan  ASA: 4  Anesthesia Plan: MAC   Post-op Pain Management: Tylenol PO (pre-op)*   Induction: Intravenous  PONV Risk Score and Plan: 2 and Ondansetron, Treatment may vary due to age or medical condition and TIVA  Airway Management Planned: Natural Airway  Additional Equipment: Arterial line  Intra-op Plan:   Post-operative Plan:   Informed Consent: I have reviewed the patients History and Physical, chart, labs and discussed the procedure including the risks, benefits and alternatives for the proposed anesthesia with the patient or authorized representative who has indicated his/her understanding and acceptance.     Dental advisory given  Plan Discussed with: CRNA  Anesthesia Plan Comments:        Anesthesia Quick Evaluation

## 2023-01-05 NOTE — Telephone Encounter (Signed)
I spoke with Symphony in the pharmacy at [facility] and per her review of chart the patient tolerated Cefazolin in 2014.  Symphony updated the pt's allergy list to reflect this information and requested that the order be changed from Vancomycin to Cefazolin due to this information.  I advised that as she is pharmacy and per her chart review that the order can be changed to Cefazolin.

## 2023-01-06 ENCOUNTER — Other Ambulatory Visit: Payer: Self-pay

## 2023-01-06 ENCOUNTER — Inpatient Hospital Stay (HOSPITAL_COMMUNITY): Payer: MEDICARE | Admitting: Certified Registered"

## 2023-01-06 ENCOUNTER — Ambulatory Visit: Payer: MEDICARE

## 2023-01-06 ENCOUNTER — Inpatient Hospital Stay (HOSPITAL_COMMUNITY)
Admission: RE | Admit: 2023-01-06 | Discharge: 2023-01-07 | DRG: 266 | Disposition: A | Discharge status: Home / Self Care | Payer: MEDICARE | Attending: Internal Medicine | Admitting: Internal Medicine

## 2023-01-06 ENCOUNTER — Inpatient Hospital Stay (HOSPITAL_COMMUNITY): Payer: Self-pay | Admitting: Certified Registered"

## 2023-01-06 ENCOUNTER — Inpatient Hospital Stay (HOSPITAL_COMMUNITY): Payer: MEDICARE

## 2023-01-06 ENCOUNTER — Encounter (HOSPITAL_COMMUNITY): Payer: Self-pay | Admitting: Internal Medicine

## 2023-01-06 ENCOUNTER — Other Ambulatory Visit: Payer: Self-pay | Admitting: Physician Assistant

## 2023-01-06 ENCOUNTER — Encounter (HOSPITAL_COMMUNITY)
Admission: RE | Disposition: A | Discharge status: Home / Self Care | Payer: Self-pay | Source: Home / Self Care | Attending: Internal Medicine

## 2023-01-06 DIAGNOSIS — I2510 Atherosclerotic heart disease of native coronary artery without angina pectoris: Secondary | ICD-10-CM | POA: Diagnosis present

## 2023-01-06 DIAGNOSIS — Z86711 Personal history of pulmonary embolism: Secondary | ICD-10-CM

## 2023-01-06 DIAGNOSIS — J449 Chronic obstructive pulmonary disease, unspecified: Secondary | ICD-10-CM

## 2023-01-06 DIAGNOSIS — I447 Left bundle-branch block, unspecified: Secondary | ICD-10-CM | POA: Diagnosis present

## 2023-01-06 DIAGNOSIS — I739 Peripheral vascular disease, unspecified: Secondary | ICD-10-CM | POA: Diagnosis present

## 2023-01-06 DIAGNOSIS — I872 Venous insufficiency (chronic) (peripheral): Secondary | ICD-10-CM | POA: Diagnosis present

## 2023-01-06 DIAGNOSIS — I4819 Other persistent atrial fibrillation: Secondary | ICD-10-CM | POA: Diagnosis present

## 2023-01-06 DIAGNOSIS — U099 Post covid-19 condition, unspecified: Secondary | ICD-10-CM

## 2023-01-06 DIAGNOSIS — Z961 Presence of intraocular lens: Secondary | ICD-10-CM | POA: Diagnosis present

## 2023-01-06 DIAGNOSIS — D6869 Other thrombophilia: Secondary | ICD-10-CM | POA: Diagnosis present

## 2023-01-06 DIAGNOSIS — I10 Essential (primary) hypertension: Secondary | ICD-10-CM | POA: Diagnosis not present

## 2023-01-06 DIAGNOSIS — I5033 Acute on chronic diastolic (congestive) heart failure: Secondary | ICD-10-CM | POA: Diagnosis present

## 2023-01-06 DIAGNOSIS — Z952 Presence of prosthetic heart valve: Secondary | ICD-10-CM

## 2023-01-06 DIAGNOSIS — I350 Nonrheumatic aortic (valve) stenosis: Secondary | ICD-10-CM | POA: Diagnosis not present

## 2023-01-06 DIAGNOSIS — N289 Disorder of kidney and ureter, unspecified: Secondary | ICD-10-CM | POA: Diagnosis present

## 2023-01-06 DIAGNOSIS — E039 Hypothyroidism, unspecified: Secondary | ICD-10-CM | POA: Diagnosis present

## 2023-01-06 DIAGNOSIS — Z7901 Long term (current) use of anticoagulants: Secondary | ICD-10-CM

## 2023-01-06 DIAGNOSIS — Z888 Allergy status to other drugs, medicaments and biological substances status: Secondary | ICD-10-CM

## 2023-01-06 DIAGNOSIS — Z8616 Personal history of COVID-19: Secondary | ICD-10-CM | POA: Diagnosis not present

## 2023-01-06 DIAGNOSIS — K219 Gastro-esophageal reflux disease without esophagitis: Secondary | ICD-10-CM | POA: Diagnosis present

## 2023-01-06 DIAGNOSIS — Z79890 Hormone replacement therapy (postmenopausal): Secondary | ICD-10-CM

## 2023-01-06 DIAGNOSIS — E785 Hyperlipidemia, unspecified: Secondary | ICD-10-CM | POA: Diagnosis present

## 2023-01-06 DIAGNOSIS — Z86718 Personal history of other venous thrombosis and embolism: Secondary | ICD-10-CM

## 2023-01-06 DIAGNOSIS — Z880 Allergy status to penicillin: Secondary | ICD-10-CM

## 2023-01-06 DIAGNOSIS — Z79899 Other long term (current) drug therapy: Secondary | ICD-10-CM

## 2023-01-06 DIAGNOSIS — Z96652 Presence of left artificial knee joint: Secondary | ICD-10-CM | POA: Diagnosis present

## 2023-01-06 DIAGNOSIS — G8929 Other chronic pain: Secondary | ICD-10-CM | POA: Diagnosis present

## 2023-01-06 DIAGNOSIS — Z006 Encounter for examination for normal comparison and control in clinical research program: Secondary | ICD-10-CM | POA: Diagnosis not present

## 2023-01-06 DIAGNOSIS — M549 Dorsalgia, unspecified: Secondary | ICD-10-CM | POA: Diagnosis present

## 2023-01-06 DIAGNOSIS — Z6841 Body Mass Index (BMI) 40.0 and over, adult: Secondary | ICD-10-CM

## 2023-01-06 DIAGNOSIS — Z8249 Family history of ischemic heart disease and other diseases of the circulatory system: Secondary | ICD-10-CM

## 2023-01-06 DIAGNOSIS — Z882 Allergy status to sulfonamides status: Secondary | ICD-10-CM

## 2023-01-06 DIAGNOSIS — Z9841 Cataract extraction status, right eye: Secondary | ICD-10-CM

## 2023-01-06 DIAGNOSIS — I110 Hypertensive heart disease with heart failure: Secondary | ICD-10-CM | POA: Diagnosis present

## 2023-01-06 DIAGNOSIS — Z9842 Cataract extraction status, left eye: Secondary | ICD-10-CM

## 2023-01-06 DIAGNOSIS — M109 Gout, unspecified: Secondary | ICD-10-CM | POA: Diagnosis present

## 2023-01-06 DIAGNOSIS — Z9049 Acquired absence of other specified parts of digestive tract: Secondary | ICD-10-CM

## 2023-01-06 DIAGNOSIS — F419 Anxiety disorder, unspecified: Secondary | ICD-10-CM | POA: Diagnosis present

## 2023-01-06 DIAGNOSIS — Z981 Arthrodesis status: Secondary | ICD-10-CM

## 2023-01-06 HISTORY — DX: Nonrheumatic aortic (valve) stenosis: I35.0

## 2023-01-06 HISTORY — DX: Presence of prosthetic heart valve: Z95.2

## 2023-01-06 HISTORY — DX: Unspecified atrial fibrillation: I48.91

## 2023-01-06 HISTORY — PX: TRANSCATHETER AORTIC VALVE REPLACEMENT, TRANSFEMORAL: SHX6400

## 2023-01-06 HISTORY — DX: Chronic obstructive pulmonary disease, unspecified: J44.9

## 2023-01-06 HISTORY — PX: INTRAOPERATIVE TRANSTHORACIC ECHOCARDIOGRAM: SHX6523

## 2023-01-06 LAB — BASIC METABOLIC PANEL
Anion gap: 10 (ref 5–15)
BUN: 12 mg/dL (ref 8–23)
CO2: 22 mmol/L (ref 22–32)
Calcium: 8.4 mg/dL — ABNORMAL LOW (ref 8.9–10.3)
Chloride: 103 mmol/L (ref 98–111)
Creatinine, Ser: 1.01 mg/dL (ref 0.61–1.24)
GFR, Estimated: >60 mL/min (ref >60)
Glucose, Bld: 104 mg/dL — ABNORMAL HIGH (ref 70–99)
Potassium: 3.9 mmol/L (ref 3.5–5.1)
Sodium: 135 mmol/L (ref 135–145)

## 2023-01-06 LAB — POCT I-STAT, CHEM 8
BUN: 21 mg/dL (ref 8–23)
Calcium, Ion: 1.07 mmol/L — ABNORMAL LOW (ref 1.15–1.40)
Chloride: 101 mmol/L (ref 98–111)
Creatinine, Ser: 0.9 mg/dL (ref 0.61–1.24)
Glucose, Bld: 105 mg/dL — ABNORMAL HIGH (ref 70–99)
HCT: 40 % (ref 39.0–52.0)
Hemoglobin: 13.6 g/dL (ref 13.0–17.0)
Potassium: 7 mmol/L (ref 3.5–5.1)
Sodium: 134 mmol/L — ABNORMAL LOW (ref 135–145)
TCO2: 32 mmol/L (ref 22–32)

## 2023-01-06 LAB — ECHOCARDIOGRAM LIMITED
AR max vel: 5.51 cm2
AV Area VTI: 6.1 cm2
AV Area mean vel: 5.59 cm2
AV Mean grad: 3 mmHg
AV Peak grad: 5.4 mmHg
Ao pk vel: 1.16 m/s
Calc EF: 50.9 %
Single Plane A2C EF: 51.5 %
Single Plane A4C EF: 48.3 %

## 2023-01-06 LAB — PROTIME-INR
INR: 1.2 (ref 0.8–1.2)
Prothrombin Time: 15.2 seconds (ref 11.4–15.2)

## 2023-01-06 SURGERY — IMPLANTATION, AORTIC VALVE, TRANSCATHETER, FEMORAL APPROACH
Anesthesia: Monitor Anesthesia Care | Laterality: Right

## 2023-01-06 MED ORDER — FENTANYL CITRATE (PF) 100 MCG/2ML IJ SOLN
INTRAMUSCULAR | Status: DC | PRN
  Administered 2023-01-06: 25 ug via INTRAVENOUS
  Administered 2023-01-06: 50 ug via INTRAVENOUS
  Administered 2023-01-06: 25 ug via INTRAVENOUS

## 2023-01-06 MED ORDER — CHLORHEXIDINE GLUCONATE 4 % EX SOLN: 60.0000 mL | Freq: Once | CUTANEOUS | Status: DC

## 2023-01-06 MED ORDER — OMEGA-3 FATTY ACIDS 1000 MG PO CAPS: 1.0000 g | ORAL_CAPSULE | Freq: Every morning | ORAL | Status: DC

## 2023-01-06 MED ORDER — CEFAZOLIN SODIUM-DEXTROSE 2-4 GM/100ML-% IV SOLN
2.0000 g | Freq: Three times a day (TID) | INTRAVENOUS | Status: AC
  Administered 2023-01-06 (×2): 2 g via INTRAVENOUS
  Filled 2023-01-06 (×2): qty 100

## 2023-01-06 MED ORDER — PREGABALIN 25 MG PO CAPS
75.0000 mg | ORAL_CAPSULE | Freq: Three times a day (TID) | ORAL | Status: DC | PRN
  Filled 2023-01-06: qty 1

## 2023-01-06 MED ORDER — OXYCODONE HCL 5 MG PO TABS
5.0000 mg | ORAL_TABLET | ORAL | Status: DC | PRN
  Administered 2023-01-07: 10 mg via ORAL
  Filled 2023-01-06: qty 2

## 2023-01-06 MED ORDER — SODIUM CHLORIDE 0.9 % IV SOLN: INTRAVENOUS | Status: AC

## 2023-01-06 MED ORDER — ALLOPURINOL 100 MG PO TABS
100.0000 mg | ORAL_TABLET | Freq: Every day | ORAL | Status: DC
  Administered 2023-01-06 – 2023-01-07 (×2): 100 mg via ORAL
  Filled 2023-01-06 (×2): qty 1

## 2023-01-06 MED ORDER — IOHEXOL 350 MG/ML SOLN
INTRAVENOUS | Status: DC | PRN
  Administered 2023-01-06: 105 mL

## 2023-01-06 MED ORDER — PROPOFOL 500 MG/50ML IV EMUL
INTRAVENOUS | Status: DC | PRN
  Administered 2023-01-06: 20 ug/kg/min via INTRAVENOUS

## 2023-01-06 MED ORDER — LACTATED RINGERS IV SOLN: INTRAVENOUS | Status: DC

## 2023-01-06 MED ORDER — SODIUM CHLORIDE 0.9% FLUSH: 3.0000 mL | Freq: Two times a day (BID) | INTRAVENOUS | Status: DC

## 2023-01-06 MED ORDER — NORTRIPTYLINE HCL 25 MG PO CAPS
25.0000 mg | ORAL_CAPSULE | Freq: Every day | ORAL | Status: DC
  Administered 2023-01-06: 25 mg via ORAL
  Filled 2023-01-06: qty 1

## 2023-01-06 MED ORDER — PROPOFOL 10 MG/ML IV BOLUS
INTRAVENOUS | Status: DC | PRN
  Administered 2023-01-06: 20 mg via INTRAVENOUS

## 2023-01-06 MED ORDER — PREDNISONE 5 MG PO TABS
5.0000 mg | ORAL_TABLET | Freq: Every day | ORAL | Status: DC
  Administered 2023-01-07: 5 mg via ORAL
  Filled 2023-01-06: qty 1

## 2023-01-06 MED ORDER — LORATADINE 10 MG PO TABS
10.0000 mg | ORAL_TABLET | Freq: Every day | ORAL | Status: DC
  Administered 2023-01-06 – 2023-01-07 (×2): 10 mg via ORAL
  Filled 2023-01-06 (×2): qty 1

## 2023-01-06 MED ORDER — PROTAMINE SULFATE 10 MG/ML IV SOLN
INTRAVENOUS | Status: DC | PRN
  Administered 2023-01-06: 210 mg via INTRAVENOUS

## 2023-01-06 MED ORDER — FUROSEMIDE 10 MG/ML IJ SOLN
40.0000 mg | Freq: Once | INTRAMUSCULAR | Status: AC
  Administered 2023-01-06: 40 mg via INTRAVENOUS
  Filled 2023-01-06: qty 4

## 2023-01-06 MED ORDER — NITROGLYCERIN IN D5W 200-5 MCG/ML-% IV SOLN: 0.0000 ug/min | INTRAVENOUS | Status: DC

## 2023-01-06 MED ORDER — SODIUM CHLORIDE 0.9 % IV SOLN: INTRAVENOUS | Status: DC

## 2023-01-06 MED ORDER — SODIUM CHLORIDE 0.9 % IV SOLN: 250.0000 mL | INTRAVENOUS | Status: DC | PRN

## 2023-01-06 MED ORDER — TRAMADOL HCL 50 MG PO TABS
50.0000 mg | ORAL_TABLET | ORAL | Status: DC | PRN
  Administered 2023-01-06 – 2023-01-07 (×2): 50 mg via ORAL
  Administered 2023-01-07: 100 mg via ORAL
  Filled 2023-01-06: qty 2
  Filled 2023-01-06 (×2): qty 1

## 2023-01-06 MED ORDER — ONDANSETRON HCL 4 MG/2ML IJ SOLN: 4.0000 mg | Freq: Four times a day (QID) | INTRAMUSCULAR | Status: DC | PRN

## 2023-01-06 MED ORDER — HEPARIN SODIUM (PORCINE) 1000 UNIT/ML IJ SOLN
INTRAMUSCULAR | Status: DC | PRN
  Administered 2023-01-06: 21000 [IU] via INTRAVENOUS

## 2023-01-06 MED ORDER — CHLORHEXIDINE GLUCONATE 0.12 % MT SOLN
15.0000 mL | Freq: Once | OROMUCOSAL | Status: AC
  Administered 2023-01-06: 15 mL via OROMUCOSAL
  Filled 2023-01-06: qty 15

## 2023-01-06 MED ORDER — CHLORHEXIDINE GLUCONATE 4 % EX SOLN: 30.0000 mL | CUTANEOUS | Status: DC

## 2023-01-06 MED ORDER — MORPHINE SULFATE (PF) 2 MG/ML IV SOLN: 1.0000 mg | INTRAVENOUS | Status: DC | PRN

## 2023-01-06 MED ORDER — SODIUM CHLORIDE 0.9% FLUSH: 3.0000 mL | INTRAVENOUS | Status: DC | PRN

## 2023-01-06 MED ORDER — LIDOCAINE HCL (PF) 1 % IJ SOLN
INTRAMUSCULAR | Status: AC
  Filled 2023-01-06: qty 30

## 2023-01-06 MED ORDER — LIDOCAINE HCL (PF) 1 % IJ SOLN
INTRAMUSCULAR | Status: DC | PRN
  Administered 2023-01-06 (×2): 10 mL

## 2023-01-06 SURGICAL SUPPLY — 32 items
BAG SNAP BAND KOVER 36X36 (MISCELLANEOUS) ×4 IMPLANT
BALLN TRUE 24X4.5 (BALLOONS) ×2
BALLOON TRUE 24X4.5 (BALLOONS) IMPLANT
CABLE SURGICAL S-101-97-12 (CABLE) IMPLANT
CATH DIAG EXPO 6F VENT PIG 145 (CATHETERS) IMPLANT
CATH INFINITI 5FR ANG PIGTAIL (CATHETERS) IMPLANT
CATH INFINITI 6F AL1 (CATHETERS) IMPLANT
CLOSURE PERCLOSE PROSTYLE (VASCULAR PRODUCTS) IMPLANT
KIT HEART LEFT (KITS) ×2 IMPLANT
KIT SINGLE USE MANIFOLD (KITS) IMPLANT
KIT SYRINGE INJ CVI SPIKEX1 (MISCELLANEOUS) IMPLANT
PACK CARDIAC CATHETERIZATION (CUSTOM PROCEDURE TRAY) ×2 IMPLANT
SET ATX-X65L (MISCELLANEOUS) IMPLANT
SHEATH DRYSEAL FLEX 22FR 33CM (SHEATH) IMPLANT
SHEATH PINNACLE 6F 10CM (SHEATH) IMPLANT
SHEATH PINNACLE 8F 10CM (SHEATH) IMPLANT
SHEATH PROBE COVER 6X72 (BAG) IMPLANT
STOPCOCK MORSE 400PSI 3WAY (MISCELLANEOUS) ×4 IMPLANT
SYS EVOLUT FX DELIVERY 34 (CATHETERS) ×2
SYS EVOLUT FX LOADING 34 (CATHETERS) ×2
SYSTEM EVOLUT FX DELIVERY 34 (CATHETERS) IMPLANT
SYSTEM EVOLUT FX LOADING 34 (CATHETERS) IMPLANT
TRANSDUCER W/STOPCOCK (MISCELLANEOUS) ×4 IMPLANT
TUBING CIL FLEX 10 FLL-RA (TUBING) IMPLANT
VALVE EVOLUT FX 34 (Valve) IMPLANT
WIRE AMPLATZ SS-J .035X180CM (WIRE) IMPLANT
WIRE EMERALD 3MM-J .035X150CM (WIRE) IMPLANT
WIRE EMERALD 3MM-J .035X260CM (WIRE) IMPLANT
WIRE EMERALD ST .035X260CM (WIRE) IMPLANT
WIRE MICRO SET SILHO 5FR 7 (SHEATH) IMPLANT
WIRE SAFARI SM CURVE 275 (WIRE) IMPLANT
WIRE TORQFLEX AUST .018X40CM (WIRE) IMPLANT

## 2023-01-06 NOTE — Interval H&P Note (Signed)
History and Physical Interval Note:  01/06/2023 7:01 AM  Robert [name]  has presented today for surgery, with the diagnosis of Severe Aortic Stenosis.  The various methods of treatment have been discussed with the patient and family. After consideration of risks, benefits and other options for treatment, the patient has consented to  Procedure(s): Transcatheter Aortic Valve Replacement, Transfemoral (Right) INTRAOPERATIVE TRANSTHORACIC ECHOCARDIOGRAM (N/A) as a surgical intervention.  The patient's history has been reviewed, patient examined, no change in status, stable for surgery.  I have reviewed the patient's chart and labs.  Questions were answered to the patient's satisfaction.     [name]

## 2023-01-06 NOTE — Progress Notes (Signed)
After bed rest complete patient ambulated to rest room and back to chair. B groins soft. Rt groin with some staining on dressing but no hematoma noted. And no further staining on the dressing at this time. Call bell within reach. Kreston Ahrendt, Randall An RN

## 2023-01-06 NOTE — Progress Notes (Signed)
  [facility]   MULTIDISCIPLINARY HEART VALVE TEAM  Patient doing well s/p TAVR. He is hemodynamically stable. Groin sites stable. ECG with afib and new LBBB but no high grade block. Arterial line discontinued and transferred to 4E. I stat labs showed K of 7. ? Accuracy. Will do a stat BMET. Give one dose of IV lasix 40mg  now for elevated LVEDP ~18.  Plan for early ambulation after bedrest completed and hopeful discharge over the next 24-48 hours.   Cline Crock PA-C  [facility]  Pager [phone]

## 2023-01-06 NOTE — Op Note (Signed)
[facility]   MULTIDISCIPLINARY HEART VALVE TEAM   TAVR OPERATIVE NOTE   Date of Procedure:  01/06/2023   Preoperative Diagnosis: Severe Aortic Stenosis   Postoperative Diagnosis: Same   Procedure:   Transcatheter Aortic Valve Replacement - Percutaneous  Transfemoral Approach  Medtronic Evolut FX (size 34 mm, serial # 7 )   Co-Surgeons:  Eugenio Hoes, MD, and Alverda Skeans, MD  Anesthesiologist:  Lewie Loron, MD  Echocardiographer:  Charlton Haws, MD  Pre-operative Echo Findings:  Severe aortic stenosis Norma left ventricular systolic function  Post-operative Echo Findings: No paravalvular leak Normal left ventricular systolic function  Left Heart Catheterization: LVEDP  BRIEF CLINICAL NOTE AND INDICATIONS FOR SURGERY  The patient is a 72 year old male with a history of severe symptomatic aortic stenosis, persistent atrial fibrillation on Coumadin, GERD, BMI of 41, and previous COVID infection who is referred for elective transcatheter aortic valve replacement with a 34 mm Evolut fracture valve from the right transfemoral approach.  During the course of the patient's preoperative work up they have been evaluated comprehensively by a multidisciplinary team of specialists coordinated through the Multidisciplinary [facility] Valve Clinic in the [facility].  They have been demonstrated to suffer from symptomatic severe aortic stenosis as noted above. The patient has been counseled extensively as to the relative risks and benefits of all options for the treatment of severe aortic stenosis including long term medical therapy, conventional surgery for aortic valve replacement, and transcatheter aortic valve replacement.  The patient has been independently evaluated in formal cardiac surgical consultation by Dr Leafy Ro, who deemed the patient appropriate for TAVR. Based upon review of all of the patient's preoperative diagnostic tests they  are felt to be candidate for transcatheter aortic valve replacement using the transfemoral approach as an alternative to conventional surgery.    Following the decision to proceed with transcatheter aortic valve replacement, a discussion has been held regarding what types of management strategies would be attempted intraoperatively in the event of life-threatening complications, including whether or not the patient would be considered a candidate for the use of cardiopulmonary bypass and/or conversion to open sternotomy for attempted surgical intervention.  The patient has been advised of a variety of complications that might develop peculiar to this approach including but not limited to risks of death, stroke, paravalvular leak, aortic dissection or other major vascular complications, aortic annulus rupture, device embolization, cardiac rupture or perforation, acute myocardial infarction, arrhythmia, heart block or bradycardia requiring permanent pacemaker placement, congestive heart failure, respiratory failure, renal failure, pneumonia, infection, other late complications related to structural valve deterioration or migration, or other complications that might ultimately cause a temporary or permanent loss of functional independence or other long term morbidity.  The patient provides full informed consent for the procedure as described and all questions were answered preoperatively.  DETAILS OF THE OPERATIVE PROCEDURE  PREPARATION:   The patient is brought to the operating room on the above mentioned date and central monitoring was established by the anesthesia team including placement of a radial arterial line. The patient is placed in the supine position on the operating table.  Intravenous antibiotics are administered. The patient is monitored closely throughout the procedure under conscious sedation.     Baseline transthoracic echocardiogram is performed. The patient's chest, abdomen, both groins,  and both lower extremities are prepared and draped in a sterile manner. A time out procedure is performed.   PERIPHERAL ACCESS:   Using ultrasound guidance, femoral  arterial and venous access is obtained with placement of 6 Fr sheaths on the left and right sides respectively.  [place] images are captured and digitally stored in the patient's record. A pigtail diagnostic catheter was passed through the femoral arterial sheath under fluoroscopic guidance into the aortic root (non-coronary cusp).     TRANSFEMORAL ACCESS:  A micropuncture technique is used to access the right femoral artery under fluoroscopic and ultrasound guidance.  [place] images are captured and digitally stored in the patient's chart. 2 Perclose devices are deployed at 10' and 2' positions to 'PreClose' the femoral artery. An 8 French sheath is placed and then an Amplatz Superstiff wire is advanced through the sheath. This is changed out for an 22 Fr Dry-Seal sheath after progressively dilating over the Superstiff wire.  An AL-1 catheter was used to direct a straight-tip exchange length wire across the native aortic valve into the left ventricle. This was exchanged out for a pigtail catheter and position was confirmed in the LV apex.  Simultaneous LV and Ao pressures were recorded.  The pigtail catheter was exchanged for a Safari wire in the LV apex.  Direct left ventricular rapid pacing thresholds were were tested and found to be adequate.  BALLOON AORTIC VALVULOPLASTY:  Balloon aortic valvuloplasty with a 25 mm true balloon was performed with direct left ventricular rapid pacing.  TRANSCATHETER HEART VALVE DEPLOYMENT:  A Medtronic Evolut FX transcatheter heart valve (size 34 mm) was prepared and crimped per manufacturer's guidelines, and the proper orientation of the valve is confirmed on the Medtronic delivery system. The valve was advanced through the introducer sheath and across the aortic arch over the Confida wire until it is positioned  at the base of the pigtail catheter in the aortic valve annulus. Using the fine tuning wheel, valve deployment begins until annular contact is made. Controlled ventricular pacing is performed. Once proper position is confirmed via aortic root angiograms in the cusp overlap and LAO projections, the valve is deployed to 80% where it is fully functional. The patient's hemodynamic recovery following valve deployment is good.  Final position is confirmed and the valve is slowly deployed over 30 seconds until both paddles are released. The delivery catheter and Confida wire are removed and the valve is assessed with echocardiography. Echo demostrated acceptable post-procedural gradients, stable mitral valve function, and no aortic insufficiency.    PROCEDURE COMPLETION:  The sheath was removed and femoral artery closure is performed using the 2 previously deployed Perclose devices.  Protamine is administered once femoral arterial repair was complete. The site is clear with no evidence of bleeding or hematoma after the sutures are tightened. Perclose closure is used for contralateral femoral arterial hemostasis for the 6 Fr sheath.  The patient tolerated the procedure well and is transported to the recovery area in stable condition. There were no immediate intraoperative complications. All sponge instrument and needle counts are verified correct at completion of the operation.   The patient received a total of 105 mL of intravenous contrast during the procedure.   Alverda Skeans, MD @td  @now

## 2023-01-06 NOTE — Op Note (Signed)
[facility]   MULTIDISCIPLINARY HEART VALVE TEAM     TAVR OPERATIVE NOTE   NAME@ [id number]  Date of Procedure:                 01/06/2023   Preoperative Diagnosis:      Severe Aortic Stenosis    Postoperative Diagnosis:    Same    Procedure:        Transcatheter Aortic Valve Replacement - Percutaneous right Transfemoral Approach             Medtronic Evolut FX  (size 34 mm)              Co-Surgeons:            Eugenio Hoes, MD and Alverda Skeans, MD     Anesthesiologist:                  Lewie Loron, MD   Echocardiographer:              Charlton Haws, MD   Pre-operative Echo Findings: Severe aortic stenosis  Normal left ventricular systolic function   Post-operative Echo Findings: no paravalvular leak Normal left ventricular systolic function      BRIEF CLINICAL NOTE AND INDICATIONS FOR SURGERY    72 yo male with increasing DOE, chest pressure and lightheadedness and with severe AS with no CAD and preserved LV function. He has NYHA class 2 symptoms attributable to the AS and should proceed with AVR. With his age, afib, obesity, lung disease he is at increased risk for SAVR and prefers to not have open heart surgery. Therefore, he is an acceptable candidate for TAVR and agree to proceed via femoral access          DETAILS OF THE OPERATIVE PROCEDURE    PREPARATION:    The patient was brought to the operating room on the above mentioned date and appropriate monitoring was established by the anesthesia team. The patient was placed in the supine position on the operating table.  Intravenous antibiotics were administered. The patient was monitored closely throughout the procedure under conscious sedation.  Baseline transthoracic echocardiogram was performed. The patient's abdomen and both groins were prepped and draped in a sterile manner. A time out procedure was performed.   PERIPHERAL ACCESS:    Using the modified Seldinger technique, femoral  arterial and venous access was obtained with placement of 6 Fr sheaths on the left and right side.  A pigtail diagnostic catheter was passed through the left arterial sheath under fluoroscopic guidance into the aortic root.    Aortic root angiography was performed in order to determine the optimal angiographic angle for valve deployment.    TRANSFEMORAL ACCESS:    Percutaneous transfemoral access and sheath placement was performed using ultrasound guidance.  The right common femoral artery was cannulated using a micropuncture needle.  A pair of Abbott Perclose percutaneous closure devices were placed and a 6 French sheath replaced into the femoral artery.  The patient was heparinized systemically and ACT verified > 250 seconds.     An 22 Fr transfemoral [place] Dry-Seal sheath was introduced into the right femoral artery after progressively dilating over an Amplatz superstiff wire. An AL-1 catheter was used to direct a straight-tip exchange length wire across the native aortic valve into the left ventricle. This was exchanged out for a pigtail catheter and position was confirmed in the LV apex. Simultaneous LV and Ao pressures were recorded.  The pigtail catheter  was exchanged for a Safari wire in the LV apex. This was used for direct LV pacing. The pacemaker was tested to ensure stable lead placement and pacemaker capture.   BALLOON AORTIC VALVULOPLASTY:    A 24 true balloon was then used for BAV and with rapid ventricular pacing, adequate BAV was accomplished and the patient's hemodynamics recovered appropriately.   TRANSCATHETER HEART VALVE DEPLOYMENT:    A Medtronic Evolut FX transcatheter heart valve (size 34 mm) was prepared and loaded into the delivery catheter system per manufacturer's guidelines and the proper orientation of the valve is confirmed under fluoroscopy. The delivery system and inline sheath were inserted into the right common femoral artery over the [facility] wire and the inline sheath  advanced into the abdominal aorta under fluoroscopic guidance. The delivery catheter was advanced around the aortic arch and the valve was carefully positioned across the aortic valve annulus. An aortic root injection was performed to confirm position and the valve deployed using cusp overlap technique under fluoroscopic guidance. Intermittent pacing was used during valve deployment. The delivery system and guidewire were retracted into the descending aorta and the nosecone re-sheathed. Valve function was assessed using echocardiography. There is felt to be no paravalvular leak and no central aortic insufficiency. The patient's hemodynamic recovery following valve deployment is good.        PROCEDURE COMPLETION:    The delivery system and in-line sheath were removed and femoral artery closure performed using the Perclose devices.  Protamine was administered once femoral arterial repair was complete. A completion right iliac/femoral angiogram was performed via the pigtail catheter which confirmed adequate closure of the right femoral access site. The , pigtail catheters and femoral sheaths were removed with manual pressure used venous for hemostasis and a perclose closure device for contralateral arterial hemostasis.    The patient tolerated the procedure well and is transported to the cath lab recovery area in stable condition. There were no immediate intraoperative complications. All sponge instrument and needle counts are verified correct at completion of the operation.    No blood products were administered during the operation.   The patient received a total of 110 mL of intravenous contrast during the procedure.    Eugenio Hoes, MD

## 2023-01-06 NOTE — Anesthesia Procedure Notes (Signed)
Procedure Name: MAC Date/Time: 01/06/2023 7:45 AM  Performed by: Drema Pry, CRNAPre-anesthesia Checklist: Patient identified, Emergency Drugs available, Suction available and Patient being monitored Patient Re-evaluated:Patient Re-evaluated prior to induction Oxygen Delivery Method: Simple face mask

## 2023-01-06 NOTE — Discharge Summary (Incomplete)
[facility]   MULTIDISCIPLINARY HEART VALVE TEAM  Discharge Summary    Patient ID: Robert [name] MRN: [id number]; DOB: 05/18/1951  Admit date: 01/06/2023 Discharge date: 01/07/2023  Primary Care Provider: Tresa Garter, MD  Primary Cardiologist: Jodelle Red, MD / Dr. Lynnette Caffey & Dr. Leafy Ro (TAVR)  Discharge Diagnoses    Principal Problem:   S/P TAVR (transcatheter aortic valve replacement) Active Problems:   Dyslipidemia   History of pulmonary embolism   Severe aortic stenosis   Chronic venous insufficiency   Long term (current) use of anticoagulants   History of DVT of lower extremity   Essential hypertension   Post covid-19 condition, unspecified   Allergies Allergies  Allergen Reactions   Cardizem [Diltiazem] Anaphylaxis   Lopressor [Metoprolol Tartrate] Anaphylaxis    Asthmatic issues    Sulfa Antibiotics Hives and Swelling   Amiodarone     Hand swelling   Anoro Ellipta [Umeclidinium-Vilanterol]     Too much mucus   Breo Ellipta [Fluticasone Furoate-Vilanterol]     Too much mucus   Exalgo [Hydromorphone Hcl] Nausea Only    Sick and nauseated   Gabapentin     Made pt feel out of it    Hydrocodone-Acetaminophen Nausea Only    REACTION: nausea   Penicillins Hives    Tolerated Cefazolin 04/07/13, S.Andrey Campanile, PharmD   Sulfonamide Derivatives Hives and Swelling   Tylenol [Acetaminophen]     Liver issues   Cat Hair Extract Itching    Diagnostic Studies/Procedures    TAVR OPERATIVE NOTE     Date of Procedure:                01/06/2023    Preoperative Diagnosis:      Severe Aortic Stenosis    Postoperative Diagnosis:    Same    Procedure:        Transcatheter Aortic Valve Replacement - Percutaneous  Transfemoral Approach             Medtronic Evolut FX (size 34 mm, serial # 7 )              Co-Surgeons:                        Eugenio Hoes, MD, and Alverda Skeans, MD   Anesthesiologist:                  Lewie Loron,  MD   Echocardiographer:              Charlton Haws, MD   Pre-operative Echo Findings:  Severe aortic stenosis Norma left ventricular systolic function   Post-operative Echo Findings: No paravalvular leak Normal left ventricular systolic function   Left Heart Catheterization: LVEDP  _____________    Echo 01/07/23: completed but pending formal read at the time of discharge   History of Present Illness     Robert [name] is a 72 y.o. male with a history of DVT/PE, persistent afib on Coumadin (pt prefers this due to availability of reversal agent), GERD, morbid obesity (BMI 41), post covid syndrome and severe AS who presented to [facility] on 01/06/23 for planned TAVR.   He was seen by Dr. Cristal Deer recently for increasing shortness of breath since a Covid infection in 06/2022. Echo 11/13/22 showed EF 55%, severe AS with mean grad 40 mmHg, peak grad 58.4 mmHg, AVA 0.65 cm2, DVI 0.21, SVI 23. PET stress test 11/26/22 showed no evidence of ischemia however the test  was thought to be intermediate risk given resting abnormal EF, decreased with stress, and severe coronary artery calcification of the LAD and right coronary artery. [facility] 12/16/22 showed mild non obstructive CAD with elevated filling pressures. Lasix was increased.    The patient was evaluated by the multidisciplinary valve team and felt to have severe, symptomatic aortic stenosis and to be a suitable candidate for TAVR, which was set up for 01/06/23.   Hospital Course     Consultants: none   Severe AS: s/p successful TAVR with a 34 mm Medtronic FX THV via the TF approach on 01/06/23. Post operative echo completed but pending formal read. Groin sites are stable. ECG with sinus and no high grade heart block. Initial post op ECG showed a new LBBB but this has since resolved. Resumed on Coumadin. Walked with cardiac rehab with no issues. Plan for discharge home today with close follow up in the outpatient setting.   Acute on chronic  diastolic CHF: as evidenced by an elevated LVEDP of 18 mm hg. He was treated with one dose of IV lasix. Plan to resume home lasix 20 mg BID.   Persistent atrial fibrillation: resume on home coumadin. He has a coumadin visit scheduled for 01/13/23. Of note, he is mostly rate controlled without any AV nodal blocking agents but had some elevated rates on tele. Per review of Dr. Di Kindle last note, he cannot tolerate diltiazem or beta blockers and has declined cardioversion in the past.   HTN: BPs have been moderately elevated. Resume home lasix and follow in outpatient setting. Patient has many medication intolerances.   Acute on chronic back pain: pt and wife think this was exacerbated by surgery. Recommended he try tylenol   Renal lesion: pre TAVR CTs showed "indeterminate 3.5 cm lesion of the upper pole of the left kidney. Recommend further evaluation contrast-enhanced abdominal MRI." This will be discussed in the outpatient setting.  _____________  Discharge Vitals Blood pressure (!) 152/75, pulse 89, temperature 98.3 F (36.8 C), temperature source Oral, resp. rate 20, height 5\' 11"  (1.803 m), weight (!) 144.1 kg, SpO2 96%.  Filed Weights   01/05/23 0900 01/06/23 0559 01/07/23 0500  Weight: (!) 144.2 kg (!) 142 kg (!) 144.1 kg   GEN: morbidly obese.  HEENT: normal Neck: no JVD or masses Cardiac: irreg irreg; no murmurs, rubs, or gallops,no edema  Respiratory:  clear to auscultation bilaterally, normal work of breathing GI: soft, nontender, nondistended, + BS MS: no deformity or atrophy Skin: warm and dry, no rash.  Groin sites clear without hematoma or ecchymosis  Neuro:  Alert and Oriented x 3, Strength and sensation are intact Psych: euthymic mood, full affect   Labs & Radiologic Studies    CBC Recent Labs    01/06/23 1124 01/07/23 0036  WBC  --  10.4  HGB 13.6 15.2  HCT 40.0 43.9  MCV  --  94.6  PLT  --  219   Basic Metabolic Panel Recent Labs    82/95/62 1254  01/07/23 0036  NA 135 135  K 3.9 3.4*  CL 103 99  CO2 22 25  GLUCOSE 104* 112*  BUN 12 13  CREATININE 1.01 1.13  CALCIUM 8.4* 8.6*  MG  --  1.7   Liver Function Tests No results for input(s): "AST", "ALT", "ALKPHOS", "BILITOT", "PROT", "ALBUMIN" in the last 72 hours. No results for input(s): "LIPASE", "AMYLASE" in the last 72 hours. Cardiac Enzymes No results for input(s): "CKTOTAL", "CKMB", "CKMBINDEX", "TROPONINI" in the  last 72 hours. BNP Invalid input(s): "POCBNP" D-Dimer No results for input(s): "DDIMER" in the last 72 hours. Hemoglobin A1C No results for input(s): "HGBA1C" in the last 72 hours. Fasting Lipid Panel No results for input(s): "CHOL", "HDL", "LDLCALC", "TRIG", "CHOLHDL", "LDLDIRECT" in the last 72 hours. Thyroid Function Tests No results for input(s): "TSH", "T4TOTAL", "T3FREE", "THYROIDAB" in the last 72 hours.  Invalid input(s): "FREET3" _____________  Structural Heart Procedure  Result Date: 01/06/2023 See surgical note for result.  ECHOCARDIOGRAM LIMITED  Result Date: 01/06/2023    ECHOCARDIOGRAM LIMITED REPORT   Patient Name:   Robert [name] Date of Exam: 01/06/2023 Medical Rec #:  [id number]         Height:       71.0 in Accession #:    [id number]        Weight:       313.0 lb Date of Birth:  07-22-1950         BSA:          2.551 m Patient Age:    72 years          BP:           128/89 mmHg Patient Gender: M                 HR:           86 bpm. Exam Location:  Inpatient Procedure: Limited Echo, Cardiac Doppler and Color Doppler Indications:     I35.0 Nonrheumatic aortic (valve) stenosis  History:         Patient has prior history of Echocardiogram examinations, most                  recent 11/13/2022. Abnormal ECG, COPD, Aortic Valve Disease,                  Arrythmias:SVT and Atrial Fibrillation, Signs/Symptoms:Chest                  Pain; Risk Factors:Hypertension and Dyslipidemia. Severe aortic                  stenosis.                  Aortic  Valve: 34 Medtronic Sapien prosthetic, stented (TAVR)                  valve is present in the aortic position. Procedure Date:                  01/06/2023.  Sonographer:     Sheralyn Boatman RDCS Sonographer#2:   Eulah Pont RDCS Referring Phys:  [id number] Wille Celeste THOMPSON Diagnosing Phys: Charlton Haws MD IMPRESSIONS  1. Inferior basal hypokinesis . Left ventricular ejection fraction, by estimation, is 50 to 55%. The left ventricle has low normal function.  2. The mitral valve is abnormal. No evidence of mitral valve regurgitation.  3. Pre TAVR: severe AS with calcified valve fused right/left cusps mean gradient 32 mmHg peak 60 mmHg AVA 1.1 cm2 normalized to BSA 0.43         Post TAVR: 34 mm Medtronic Evolut Positioned somewhat deep No PVL mean gradient 3 peak 6 mmHg with AVA 5.1 cm2. The aortic valve has been repaired/replaced. There is a 34 Medtronic Sapien prosthetic (TAVR) valve present in the aortic position. Procedure Date: 01/06/2023. FINDINGS  Left Ventricle: Inferior basal hypokinesis. Left ventricular ejection fraction, by estimation, is 50 to 55%. The left ventricle has  low normal function. Mitral Valve: The mitral valve is abnormal. There is mild thickening of the mitral valve leaflet(s). There is mild calcification of the mitral valve leaflet(s). Aortic Valve: Pre TAVR: severe AS with calcified valve fused right/left cusps mean gradient 32 mmHg peak 60 mmHg AVA 1.1 cm2 normalized to BSA 0.43 Post TAVR: 34 mm Medtronic Evolut Positioned somewhat deep No PVL mean gradient 3 peak 6 mmHg with AVA 5.1 cm2. The aortic valve has been repaired/replaced. Aortic valve mean gradient measures 3.0 mmHg. Aortic valve peak gradient measures 5.4 mmHg. Aortic valve area, by VTI measures 6.10 cm. There is a 34 Medtronic Sapien prosthetic, stented (TAVR) valve present in the aortic position. Procedure Date: 01/06/2023. Aorta: The aortic root is normal in size and structure. Additional Comments: Spectral Doppler performed.  Color Doppler performed.  LEFT VENTRICLE PLAX 2D LVOT diam:     2.90 cm LV SV:         123 LV SV Index:   48 LVOT Area:     6.61 cm  LV Volumes (MOD) LV vol d, MOD A2C: 119.0 ml LV vol d, MOD A4C: 109.0 ml LV vol s, MOD A2C: 57.7 ml LV vol s, MOD A4C: 56.4 ml LV SV MOD A2C:     61.3 ml LV SV MOD A4C:     109.0 ml LV SV MOD BP:      59.9 ml AORTIC VALVE AV Area (Vmax):    5.51 cm AV Area (Vmean):   5.59 cm AV Area (VTI):     6.10 cm AV Vmax:           116.00 cm/s AV Vmean:          77.000 cm/s AV VTI:            0.201 m AV Peak Grad:      5.4 mmHg AV Mean Grad:      3.0 mmHg LVOT Vmax:         96.70 cm/s LVOT Vmean:        65.150 cm/s LVOT VTI:          0.186 m LVOT/AV VTI ratio: 0.92 TRICUSPID VALVE TR Peak grad:   19.7 mmHg TR Vmax:        222.00 cm/s  SHUNTS Systemic VTI:  0.19 m Systemic Diam: 2.90 cm Charlton Haws MD Electronically signed by Charlton Haws MD Signature Date/Time: 01/06/2023/9:42:55 AM    Final    DG Chest 2 View  Result Date: 01/06/2023 CLINICAL DATA:  Preop for aortic valve surgery EXAM: CHEST - 2 VIEW COMPARISON:  03/27/2020 FINDINGS: Cardiomegaly. Stable aortic and hilar contours. There is no edema, consolidation, effusion, or pneumothorax. No acute osseous finding. IMPRESSION: No evidence of acute disease. Electronically Signed   By: Tiburcio Pea M.D.   On: 01/06/2023 05:48   CT CORONARY MORPH W/CTA COR W/SCORE W/CA W/CM &/OR WO/CM  Addendum Date: 12/23/2022   ADDENDUM REPORT: 12/23/2022 07:10 CLINICAL DATA:  Aortic valve replacement (TAVR), pre-op eval EXAM: Cardiac TAVR CT TECHNIQUE: The patient was scanned on a Siemens Force 192 slice scanner. A 120 kV retrospective scan was triggered in the descending thoracic aorta at 111 HU's. Gantry rotation speed was 270 msecs and collimation was .9 mm. The 3D data set was reconstructed in 5% intervals of the R-R cycle. Systolic and diastolic phases were analyzed on a dedicated work station using MPR, MIP and VRT modes. The patient received  OMNIPAQUE IOHEXOL 350 MG/ML SOLN of contrast. FINDINGS: Image  quality: Average, reduced CNR.  Reduced SNR. Aortic Valve: Tricuspid aortic valve with severely reduced cusp excursion. Severely thickened and severely calcified aortic valve cusps. AV calcium score: 2591 Virtual Basal Annulus Measurements: Maximum/Minimum Diameter: 29.8 x 23.2 mm Perimeter:  85 mm Area:  552 mm2 No significant LVOT calcifications. Membranous septal length: 6.7 mm Based on these measurements, the annulus would be suitable for a 29 mm Sapien 3 valve. Alternatively, Heart Team can consider 34 mm Evolut valve. Recommend Heart Team discussion for valve selection. Sinus of Valsalva Measurements: Non-coronary:  37 mm Right - coronary:  36 mm Left - coronary:  36 mm Sinus of Valsalva Height: Left: 27.3 mm Right: 25.3 mm Aorta: Short segment common origin of brachiocephalic and left common carotid arteries off of aortic arch. Sinotubular Junction:  31 mm Ascending Thoracic Aorta:  36 mm Aortic Arch:  31 mm Descending Thoracic Aorta:  27 mm Coronary Artery Height above Annulus: Left main: 16.8 mm Right coronary: 19.3 mm Coronary Arteries: Normal coronary origin. Right dominance. The study was performed without use of NTG and insufficient for plaque evaluation. Coronary artery calcium seen in 3 vessel distribution. Optimum Fluoroscopic Angle for Delivery:  LAO 3 CAU 3 OTHER: Atria: Left atrial appendage: No thrombus. Mitral valve: Grossly normal, mild mitral annular calcifications. Pulmonary artery: Normal caliber. Pulmonary veins: Normal anatomy. IMPRESSION: 1. 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 Severely thickened and severely calcified aortic valve cusps. 2. Aortic valve calcium score: 2591 3. Annulus area: 552 mm2, suitable for 29 mm Sapien 3 valve. No LVOT calcifications. Membranous septal length 6.7 mm. 4. Sufficient coronary artery heights from annulus. 5. Optimum fluoroscopic angle for delivery: LAO 3 CAU 3  Electronically Signed   By: Weston Brass M.D.   On: 12/23/2022 07:10   Result Date: 12/23/2022 EXAM: OVER-READ INTERPRETATION  CT CHEST The following report is a limited chest CT over-read performed by radiologist Dr. Allegra Lai of [facility] on 12/22/2022. This over-read does not include interpretation of cardiac or coronary anatomy or pathology. The cardiac TAVR interpretation by the cardiologist is attached. COMPARISON:  None Available. FINDINGS: Extracardiac findings will be described separately under dictation for contemporaneously obtained CTA chest, abdomen and pelvis. IMPRESSION: Please see separate dictation for contemporaneously obtained CTA chest, abdomen and pelvis dated 12/22/2022 for full description of relevant extracardiac findings. Electronically Signed: By: Allegra Lai M.D. On: 12/22/2022 12:56   CT ANGIO ABDOMEN PELVIS  W & WO CONTRAST  Result Date: 12/22/2022 CLINICAL DATA:  Preop evaluation for aortic valve replacement EXAM: CT ANGIOGRAPHY CHEST, ABDOMEN AND PELVIS TECHNIQUE: Non-contrast CT of the chest was initially obtained. Multidetector CT imaging through the chest, abdomen and pelvis was performed using the standard protocol during bolus administration of intravenous contrast. Multiplanar reconstructed images and MIPs were obtained and reviewed to evaluate the vascular anatomy. RADIATION DOSE REDUCTION: This exam was performed according to the departmental dose-optimization program which includes automated exposure control, adjustment of the mA and/or kV according to patient size and/or use of iterative reconstruction technique. CONTRAST:  OMNIPAQUE IOHEXOL 350 MG/ML SOLN COMPARISON:  CT abdomen and pelvis dated July 04, 2013 FINDINGS: CTA CHEST FINDINGS Cardiovascular: Mild cardiomegaly. No pericardial effusion. Aortic valve thickening and calcifications. Normal caliber thoracic aorta with mild atherosclerotic disease. Left main and three-vessel  coronary artery calcifications. Mediastinum/Nodes: Esophagus and thyroid are unremarkable. Mildly enlarged subcarinal lymph node measuring 11 mm in short axis on series 4, image 72 and mildly enlarged right paratracheal lymph node  measuring 11 mm on image 48, likely reactive. Lungs/Pleura: Central airways are patent. Mild subpleural parenchymal bands of the lower lungs, likely due to scarring. Solid pulmonary nodule of the right lower lobe measuring 6 mm on series 5, image 120, present on July 04, 2013 prior and considered benign. Musculoskeletal: No chest wall abnormality. No acute or significant osseous findings. CTA ABDOMEN AND PELVIS FINDINGS Hepatobiliary: Scattered low-attenuation liver lesions are too small to accurately characterize but likely simple cysts. No suspicious liver lesions. Prior cholecystectomy. No biliary ductal dilation. Pancreas: Unremarkable. No pancreatic ductal dilatation or surrounding inflammatory changes. Spleen: Normal in size without focal abnormality. Adrenals/Urinary Tract: Bilateral adrenal glands are unremarkable. No hydronephrosis or nephrolithiasis. Mildly thick-walled and lobulated indeterminate lesion of the upper pole of the left kidney measuring 3.5 x 2.5 cm on series 4, image 122. Additional bilateral low-attenuation renal lesions which are likely simple cysts. Stomach/Bowel: Stomach is within normal limits. Diverticulosis. No evidence of bowel wall thickening, distention, or inflammatory changes. Vascular/lymphatic: Normal caliber abdominal aorta with moderate atherosclerotic disease. No enlarged lymph nodes seen in the abdomen or pelvis. Reproductive: Prostate is unremarkable. Other: No abdominal wall hernia or abnormality. No abdominopelvic ascites. Musculoskeletal: No aggressive appearing osseous lesions. Posterior fusion hardware of L5-S1. VASCULAR MEASUREMENTS PERTINENT TO TAVR: AORTA: Minimal Aortic Diameter-67.5 mm Severity of Aortic Calcification-moderate RIGHT  PELVIS: Right Common Iliac Artery - Minimal Diameter-9.6 mm Tortuosity-moderate Calcification-moderate Right External Iliac Artery - Minimal Diameter-7.5 mm Tortuosity-moderate Calcification-none Right Common Femoral Artery - Minimal Diameter-7.4 mm Tortuosity-none Calcification-moderate LEFT PELVIS: Left Common Iliac Artery - Minimal Diameter-10.1 mm Tortuosity-moderate Calcification-mild Left External Iliac Artery - Minimal Diameter-8.9 mm Tortuosity-moderate Calcification-mild Left Common Femoral Artery - Minimal Diameter-7.6 mm Tortuosity-none Calcification-moderate Review of the MIP images confirms the above findings. IMPRESSION: 1. Vascular findings and measurements pertinent to potential TAVR procedure, as detailed above. Evaluation limited due to contrast timing. 2. Thickening and calcification of the aortic valve, compatible with reported clinical history of aortic stenosis. 3. Moderate aortoiliac atherosclerosis. Left main and 3 vessel coronary artery disease. 4. Indeterminate 3.5 cm lesion of the upper pole of the left kidney. Recommend further evaluation contrast-enhanced abdominal MRI. Electronically Signed   By: Allegra Lai M.D.   On: 12/22/2022 12:55   CT ANGIO CHEST AORTA W/CM & OR WO/CM  Result Date: 12/22/2022 CLINICAL DATA:  Preop evaluation for aortic valve replacement EXAM: CT ANGIOGRAPHY CHEST, ABDOMEN AND PELVIS TECHNIQUE: Non-contrast CT of the chest was initially obtained. Multidetector CT imaging through the chest, abdomen and pelvis was performed using the standard protocol during bolus administration of intravenous contrast. Multiplanar reconstructed images and MIPs were obtained and reviewed to evaluate the vascular anatomy. RADIATION DOSE REDUCTION: This exam was performed according to the departmental dose-optimization program which includes automated exposure control, adjustment of the mA and/or kV according to patient size and/or use of iterative reconstruction technique.  CONTRAST:  OMNIPAQUE IOHEXOL 350 MG/ML SOLN COMPARISON:  CT abdomen and pelvis dated July 04, 2013 FINDINGS: CTA CHEST FINDINGS Cardiovascular: Mild cardiomegaly. No pericardial effusion. Aortic valve thickening and calcifications. Normal caliber thoracic aorta with mild atherosclerotic disease. Left main and three-vessel coronary artery calcifications. Mediastinum/Nodes: Esophagus and thyroid are unremarkable. Mildly enlarged subcarinal lymph node measuring 11 mm in short axis on series 4, image 72 and mildly enlarged right paratracheal lymph node measuring 11 mm on image 48, likely reactive. Lungs/Pleura: Central airways are patent. Mild subpleural parenchymal bands of the lower lungs, likely due to scarring. Solid pulmonary nodule of the right  lower lobe measuring 6 mm on series 5, image 120, present on July 04, 2013 prior and considered benign. Musculoskeletal: No chest wall abnormality. No acute or significant osseous findings. CTA ABDOMEN AND PELVIS FINDINGS Hepatobiliary: Scattered low-attenuation liver lesions are too small to accurately characterize but likely simple cysts. No suspicious liver lesions. Prior cholecystectomy. No biliary ductal dilation. Pancreas: Unremarkable. No pancreatic ductal dilatation or surrounding inflammatory changes. Spleen: Normal in size without focal abnormality. Adrenals/Urinary Tract: Bilateral adrenal glands are unremarkable. No hydronephrosis or nephrolithiasis. Mildly thick-walled and lobulated indeterminate lesion of the upper pole of the left kidney measuring 3.5 x 2.5 cm on series 4, image 122. Additional bilateral low-attenuation renal lesions which are likely simple cysts. Stomach/Bowel: Stomach is within normal limits. Diverticulosis. No evidence of bowel wall thickening, distention, or inflammatory changes. Vascular/lymphatic: Normal caliber abdominal aorta with moderate atherosclerotic disease. No enlarged lymph nodes seen in the abdomen or pelvis.  Reproductive: Prostate is unremarkable. Other: No abdominal wall hernia or abnormality. No abdominopelvic ascites. Musculoskeletal: No aggressive appearing osseous lesions. Posterior fusion hardware of L5-S1. VASCULAR MEASUREMENTS PERTINENT TO TAVR: AORTA: Minimal Aortic Diameter-67.5 mm Severity of Aortic Calcification-moderate RIGHT PELVIS: Right Common Iliac Artery - Minimal Diameter-9.6 mm Tortuosity-moderate Calcification-moderate Right External Iliac Artery - Minimal Diameter-7.5 mm Tortuosity-moderate Calcification-none Right Common Femoral Artery - Minimal Diameter-7.4 mm Tortuosity-none Calcification-moderate LEFT PELVIS: Left Common Iliac Artery - Minimal Diameter-10.1 mm Tortuosity-moderate Calcification-mild Left External Iliac Artery - Minimal Diameter-8.9 mm Tortuosity-moderate Calcification-mild Left Common Femoral Artery - Minimal Diameter-7.6 mm Tortuosity-none Calcification-moderate Review of the MIP images confirms the above findings. IMPRESSION: 1. Vascular findings and measurements pertinent to potential TAVR procedure, as detailed above. Evaluation limited due to contrast timing. 2. Thickening and calcification of the aortic valve, compatible with reported clinical history of aortic stenosis. 3. Moderate aortoiliac atherosclerosis. Left main and 3 vessel coronary artery disease. 4. Indeterminate 3.5 cm lesion of the upper pole of the left kidney. Recommend further evaluation contrast-enhanced abdominal MRI. Electronically Signed   By: Allegra Lai M.D.   On: 12/22/2022 12:55   CARDIAC CATHETERIZATION  Result Date: 12/16/2022   Ost RCA to Prox RCA lesion is 20% stenosed.   Mid LAD lesion is 40% stenosed. 1.  Mild obstructive coronary artery disease. 2.  Fick cardiac output of 7.5 L/min and Fick cardiac index of 2.9 L/min/m with the following hemodynamics: Right atrial pressure mean of 15 mmHg Right ventricular pressure 47/8 with end-diastolic pressure of 15 mmHg Wedge pressure mean 23  mmHg PA pressure 46/23 with a mean of 34 mmHg. Recommendation: Continue evaluation for aortic valve intervention; increase Lasix to 20 mg twice daily and restart Coumadin tonight.   Disposition   Pt is being discharged home today in good condition.  Follow-up Plans & Appointments     Follow-up Information     Janetta Hora, PA-C. Go on 01/14/2023.   Specialties: Cardiology, Radiology Why: @ 1:55pm, please arrive at least 10 minutes early. Contact information: [address] [place] [address] [phone]                  Discharge Medications   Allergies as of 01/07/2023       Reactions   Cardizem [diltiazem] Anaphylaxis   Lopressor [metoprolol Tartrate] Anaphylaxis   Asthmatic issues    Sulfa Antibiotics Hives, Swelling   Amiodarone    Hand swelling   Anoro Ellipta [umeclidinium-vilanterol]    Too much mucus   Breo Ellipta [fluticasone Furoate-vilanterol]  Too much mucus   Exalgo [hydromorphone Hcl] Nausea Only   Sick and nauseated   Gabapentin    Made pt feel out of it    Hydrocodone-acetaminophen Nausea Only   REACTION: nausea   Penicillins Hives   Tolerated Cefazolin 04/07/13, S.Andrey Campanile, PharmD   Sulfonamide Derivatives Hives, Swelling   Tylenol [acetaminophen]    Liver issues   Cat Hair Extract Itching        Medication List     TAKE these medications    albuterol 108 (90 Base) MCG/ACT inhaler Commonly known as: VENTOLIN HFA Inhale 2 puffs into the lungs every 6 (six) hours as needed for wheezing or shortness of breath.   allopurinol 100 MG tablet Commonly known as: ZYLOPRIM Take 1 tablet (100 mg total) by mouth daily.   celecoxib 200 MG capsule Commonly known as: CeleBREX Take 1 capsule (200 mg total) by mouth 2 (two) times daily. What changed:  when to take this reasons to take this   cetirizine 10 MG tablet Commonly known as: ZYRTEC Take 1 tablet by mouth once daily What changed: when to take this    colchicine 0.6 MG tablet Take two tablets prn gout attack.Then take another one in 1-2 hrs. Do not repeat for 3 days.   famotidine 40 MG tablet Commonly known as: PEPCID Take 1 tablet by mouth once daily What changed:  when to take this reasons to take this   fish oil-omega-3 fatty acids 1000 MG capsule Take 1 g by mouth every morning.   furosemide 20 MG tablet Commonly known as: LASIX Take 1 tablet (20 mg total) by mouth 2 (two) times daily.   glucosamine-chondroitin 500-400 MG tablet Take 1 tablet by mouth every morning.   mupirocin ointment 2 % Commonly known as: BACTROBAN Place 1 Application into the nose 2 (two) times daily.   nortriptyline 25 MG capsule Commonly known as: PAMELOR TAKE 1 CAPSULE AT BEDTIME   predniSONE 5 MG tablet Commonly known as: DELTASONE TAKE 1 TABLET DAILY WITH BREAKFAST   pregabalin 75 MG capsule Commonly known as: Lyrica Take 1 capsule (75 mg total) by mouth 3 (three) times daily as needed.   triamcinolone cream 0.1 % Commonly known as: KENALOG Apply 1 Application topically 3 (three) times daily. What changed:  when to take this reasons to take this   Vitamin D3 125 MCG (5000 UT) Caps Take 1 capsule (5,000 Units total) by mouth daily.   warfarin 3 MG tablet Commonly known as: COUMADIN Take as directed. If you are unsure how to take this medication, talk to your nurse or doctor. Original instructions: TAKE 1 TABLET DAILY OR TAKE AS DIRECTED BY ANTICOAGULATION CLINIC What changed:  how much to take how to take this when to take this additional instructions   warfarin 6 MG tablet Commonly known as: COUMADIN Take as directed. If you are unsure how to take this medication, talk to your nurse or doctor. Original instructions: TAKE 1/2 TABLET BY MOUTH DAILY EXCEPT TAKE 1 TABLET ON FRIDAYS OR AS DIRECTED BY ANTICOAGULATION CLINIC What changed:  how much to take how to take this when to take this additional instructions           Outstanding Labs/Studies   none  Duration of Discharge Encounter   Greater than 30 minutes including physician time.  Signed, Cline Crock, PA-C 01/07/2023, 9:59 AM [phone]   ATTENDING ATTESTATION:  After conducting a review of all available clinical information with the care team, interviewing the patient,  and performing a physical exam, I agree with the findings and plan described in this note.   GEN: No acute distress.   HEENT:  MMM, no JVD, no scleral icterus Cardiac: RRR, no murmurs, rubs, or gallops.  Respiratory: Clear to auscultation bilaterally. GI: Soft, nontender, non-distended  MS: No edema; No deformity. 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  EKG demonstrates chronic AF without the LBBB that had been seen immediately after the procedure.  Discharge today with follow up scheduled.  Restart home Coumadin.  Alverda Skeans, MD Pager [phone]

## 2023-01-06 NOTE — Progress Notes (Addendum)
[facility] made aware of chemistry panel results in epic. Orders received. Bienvenido Proehl, Randall An  RN

## 2023-01-06 NOTE — Progress Notes (Signed)
   01/06/23 1136  Vitals  Temp 97.9 F (36.6 C)  Temp Source Oral  BP 102/68  MAP (mmHg) 80  BP Location Right Arm  BP Method Automatic  Patient Position (if appropriate) Lying  Pulse Rate 69  Pulse Rate Source Monitor  ECG Heart Rate 65  Resp 20  MEWS COLOR  MEWS Score Color Green  Oxygen Therapy  SpO2 98 %  O2 Device Room Air  MEWS Score  MEWS Temp 0  MEWS Systolic 0  MEWS Pulse 0  MEWS RR 0  MEWS LOC 0  MEWS Score 0   Patient arrived from cath lab to 4e09, b groins level 0, vital signs obtained and CCMD made aware pt on monitor. Call bell within reach.Jeniel Slauson, Randall An RN

## 2023-01-06 NOTE — Progress Notes (Signed)
  Echocardiogram 2D Echocardiogram has been performed.  Robert [name] 01/06/2023, 9:35 AM

## 2023-01-06 NOTE — Interval H&P Note (Signed)
History and Physical Interval Note:  01/06/2023 7:22 AM  Robert [name]  has presented today for surgery, with the diagnosis of Severe Aortic Stenosis.  The various methods of treatment have been discussed with the patient and family. After consideration of risks, benefits and other options for treatment, the patient has consented to  Procedure(s): Transcatheter Aortic Valve Replacement, Transfemoral (Right) INTRAOPERATIVE TRANSTHORACIC ECHOCARDIOGRAM (N/A) as a surgical intervention.  The patient's history has been reviewed, patient examined, no change in status, stable for surgery.  I have reviewed the patient's chart and labs.  Questions were answered to the patient's satisfaction.     Eugenio Hoes

## 2023-01-06 NOTE — Transfer of Care (Signed)
Immediate Anesthesia Transfer of Care Note  Patient: Robert [name]  Procedure(s) Performed: Transcatheter Aortic Valve Replacement, Transfemoral (Right) INTRAOPERATIVE TRANSTHORACIC ECHOCARDIOGRAM  Patient Location: PACU and Cath Lab  Anesthesia Type:MAC  Level of Consciousness: drowsy, patient cooperative, and responds to stimulation  Airway & Oxygen Therapy: Patient Spontanous Breathing and Patient connected to face mask oxygen  Post-op Assessment: Report given to RN and Post -op Vital signs reviewed and stable  Post vital signs: Reviewed and stable  Last Vitals:  Vitals Value Taken Time  BP    Temp    Pulse    Resp    SpO2      Last Pain:  Vitals:   01/06/23 0641  TempSrc:   PainSc: 5          Complications: There were no known notable events for this encounter.

## 2023-01-06 NOTE — Progress Notes (Signed)
  Echocardiogram 2D Echocardiogram has been performed.  Robert [name] 01/06/2023, 9:32 AM

## 2023-01-06 NOTE — Progress Notes (Signed)
Mobility Specialist Progress Note:   01/06/23 1615  Therapy Vitals  Temp 97.6 F (36.4 C)  Temp Source Oral  Resp 19  BP (!) 140/89  Patient Position (if appropriate) Sitting  Oxygen Therapy  SpO2 100 %  O2 Device Room Air  Mobility  Activity Ambulated with assistance in hallway  Level of Assistance Contact guard assist, steadying assist  Assistive Device None  Distance Ambulated (ft) 80 ft  Activity Response Tolerated well  Mobility Referral Yes  $Mobility charge 1 Mobility  Mobility Specialist Start Time (ACUTE ONLY) 1600  Mobility Specialist Stop Time (ACUTE ONLY) 1613  Mobility Specialist Time Calculation (min) (ACUTE ONLY) 13 min    Pre Mobility: 93 HR Post Mobility:  97 HR, 99% SpO2  Pt received in chair, agreeable to mobility. Ambulated in hallway w/ no AD and contact guard. Asymptomatic throughout. Pt left in chair with call bell and RN present.  D'Vante Earlene Plater Mobility Specialist Please contact via Special educational needs teacher or Rehab office at [phone]

## 2023-01-07 ENCOUNTER — Encounter (HOSPITAL_COMMUNITY): Payer: Self-pay | Admitting: Internal Medicine

## 2023-01-07 ENCOUNTER — Telehealth: Payer: Self-pay

## 2023-01-07 ENCOUNTER — Inpatient Hospital Stay (HOSPITAL_COMMUNITY): Payer: MEDICARE

## 2023-01-07 DIAGNOSIS — Z952 Presence of prosthetic heart valve: Secondary | ICD-10-CM

## 2023-01-07 LAB — CBC
HCT: 43.9 % (ref 39.0–52.0)
Hemoglobin: 15.2 g/dL (ref 13.0–17.0)
MCH: 32.8 pg (ref 26.0–34.0)
MCHC: 34.6 g/dL (ref 30.0–36.0)
MCV: 94.6 fL (ref 80.0–100.0)
Platelets: 219 10*3/uL (ref 150–400)
RBC: 4.64 MIL/uL (ref 4.22–5.81)
RDW: 12.7 % (ref 11.5–15.5)
WBC: 10.4 10*3/uL (ref 4.0–10.5)
nRBC: 0 % (ref 0.0–0.2)

## 2023-01-07 LAB — BASIC METABOLIC PANEL
Anion gap: 11 (ref 5–15)
BUN: 13 mg/dL (ref 8–23)
CO2: 25 mmol/L (ref 22–32)
Calcium: 8.6 mg/dL — ABNORMAL LOW (ref 8.9–10.3)
Chloride: 99 mmol/L (ref 98–111)
Creatinine, Ser: 1.13 mg/dL (ref 0.61–1.24)
GFR, Estimated: >60 mL/min (ref >60)
Glucose, Bld: 112 mg/dL — ABNORMAL HIGH (ref 70–99)
Potassium: 3.4 mmol/L — ABNORMAL LOW (ref 3.5–5.1)
Sodium: 135 mmol/L (ref 135–145)

## 2023-01-07 LAB — ECHOCARDIOGRAM COMPLETE
AR max vel: 1.67 cm2
AV Area VTI: 1.6 cm2
AV Area mean vel: 1.66 cm2
AV Mean grad: 10.2 mmHg
AV Peak grad: 17.2 mmHg
Ao pk vel: 2.07 m/s
Area-P 1/2: 2.85 cm2
Height: 71 in
MV VTI: 2.39 cm2
S' Lateral: 4.1 cm
Weight: 5082.93 oz

## 2023-01-07 LAB — MAGNESIUM: Magnesium: 1.7 mg/dL (ref 1.7–2.4)

## 2023-01-07 MED ORDER — FUROSEMIDE 20 MG PO TABS
20.0000 mg | ORAL_TABLET | Freq: Two times a day (BID) | ORAL | Status: DC
  Administered 2023-01-07: 20 mg via ORAL
  Filled 2023-01-07: qty 1

## 2023-01-07 MED ORDER — POTASSIUM CHLORIDE CRYS ER 20 MEQ PO TBCR
40.0000 meq | EXTENDED_RELEASE_TABLET | Freq: Once | ORAL | Status: AC
  Administered 2023-01-07: 40 meq via ORAL
  Filled 2023-01-07: qty 2

## 2023-01-07 NOTE — Anesthesia Postprocedure Evaluation (Signed)
Anesthesia Post Note  Patient: Robert [name]  Procedure(s) Performed: Transcatheter Aortic Valve Replacement, Transfemoral (Right) INTRAOPERATIVE TRANSTHORACIC ECHOCARDIOGRAM     Patient location during evaluation: PACU Anesthesia Type: MAC Level of consciousness: awake and alert Pain management: pain level controlled Vital Signs Assessment: post-procedure vital signs reviewed and stable Respiratory status: spontaneous breathing Cardiovascular status: stable Anesthetic complications: no   There were no known notable events for this encounter.  Last Vitals:  Vitals:   01/07/23 0315 01/07/23 0732  BP: (!) 149/85 (!) 152/75  Pulse: 91 89  Resp: 19 20  Temp: 36.8 C 36.8 C  SpO2: 98% 96%    Last Pain:  Vitals:   01/07/23 0919  TempSrc:   PainSc: 6                  Lewie Loron

## 2023-01-07 NOTE — Telephone Encounter (Signed)
Pt called to report he had TAVR surgery yesterday and was d/c today. Wanted to make sure he still needed his coumadin clinic apt on 7/23. Advised pt should keep apt. Advised if any changes to contact coumadin clinic. Pt verbalized understanding.

## 2023-01-07 NOTE — Discharge Instructions (Signed)

## 2023-01-07 NOTE — Progress Notes (Signed)
D/c tele and Ivs. Went over AVS with pt and his son and all questions was addressed.   Lawson Radar, RN

## 2023-01-07 NOTE — TOC CM/SW Note (Signed)
Transition of Care [facility]) - Inpatient Brief Assessment   Patient Details  Name: Robert [name] MRN: [id number] Date of Birth: 09/27/1950  Transition of Care [facility]) CM/SW Contact:    Darrold Span, RN Phone Number: 01/07/2023, 12:08 PM   Clinical Narrative: Pt stable for transition home today s/p TAVR, has RW at home- no other DME needs noted, Has transportation home, No further TOC needs noted.    Transition of Care Asessment: Insurance and Status: Insurance coverage has been reviewed Patient has primary care physician: Yes Home environment has been reviewed: Home Prior level of function:: self care Prior/Current Home Services: No current home services Social Determinants of Health Reivew: SDOH reviewed no interventions necessary Readmission risk has been reviewed: Yes Transition of care needs: no transition of care needs at this time

## 2023-01-07 NOTE — Progress Notes (Signed)
CARDIAC REHAB PHASE I   PRE:  Rate/Rhythm: 89 a-fib  BP:  Sitting: 152/75      SaO2: 98 RA  MODE:  Ambulation: 75 ft   POST:  Rate/Rhythm: 117 a-fib  BP:  Sitting: 158/87      SaO2: 99 RA   Pt ambulated independently in hall, using front wheel walker. Tolerated well with no dizziness or SOB. Returned to bed with call bell and bedside table in reach. Post TAVR education including site care, risk factors, restrictions, exercise guidelines, heart healthy diet and CRP2 reviewed. All questions and concerns addressed. Will refer to [facility] for CRP2. Plan for home later today.  [phone]  Woodroe Chen, RN BSN 01/07/2023 10:25 AM

## 2023-01-08 ENCOUNTER — Telehealth: Payer: Self-pay | Admitting: Physician Assistant

## 2023-01-08 NOTE — Telephone Encounter (Signed)
Pt reports he found his d/c paperwork and he was advised to restart normal dosing of warfarin today, not yesterday.   Advised pt to continue normal dosing and would be best to recheck INR on 7/26 rather than 7/23. It will not be enough time for pt to be therapeutic with warfarin. RS apt for 7/26. Advised if any changes to contact coumadin clinic. Pt verbalized understanding and read back dosing instructions.

## 2023-01-08 NOTE — Telephone Encounter (Signed)
  [facility]   MULTIDISCIPLINARY HEART VALVE TEAM   Patient contacted regarding discharge from [facility] on 7/17  Patient understands to follow up with a structural heart APP on 7/24 at [address] [facility].  Patient understands discharge instructions? yes Patient understands medications and regimen? yes Patient understands to bring all medications to this visit? Yes  Has had foot pain and tenderness c/w gout. Has PRN colchicine that he can take.   Cline Crock PA-C  [facility]

## 2023-01-13 ENCOUNTER — Ambulatory Visit: Payer: MEDICARE

## 2023-01-13 NOTE — Progress Notes (Signed)
[facility]   MULTIDISCIPLINARY HEART VALVE CLINIC                                     Cardiology Office Note:    Date:  01/14/2023   ID:  [name], DOB Feb 12, 1951, MRN [id number]  PCP:  Tresa Garter, MD  [facility] Cardiologist:  Jodelle Red, MD  / Dr. Lynnette Caffey & Dr. Leafy Ro (TAVR)  [facility] Electrophysiologist:  None   Referring MD: [name], Georgina Quint, MD   [facility] s/p TAVR   History of Present Illness:    Robert [name] is a 72 y.o. male with a hx of DVT/PE, persistent afib on Coumadin (pt prefers this due to availability of reversal agent), GERD, morbid obesity (BMI 41), post covid syndrome and severe AS s/p TAVR (01/06/23) who presents to clinic for follow up.  He was seen by Dr. Cristal Deer recently for increasing shortness of breath since a Covid infection in 06/2022. Echo 11/13/22 showed EF 55%, severe AS with mean grad 40 mmHg, peak grad 58.4 mmHg, AVA 0.65 cm2, DVI 0.21, SVI 23. PET stress test 11/26/22 showed no evidence of ischemia however the test was thought to be intermediate risk given resting abnormal EF, decreased with stress, and severe coronary artery calcification of the LAD and right coronary artery. [facility] 12/16/22 showed mild non obstructive CAD with elevated filling pressures. Lasix was increased.     The patient was evaluated by the multidisciplinary valve team and underwent successful TAVR with a 34 mm Medtronic FX THV via the TF approach on 01/06/23. Post operative echo showed EF 50%, normally functioning TAVR with a mean gradient of 7 mmHg and no PVL, valve noted to be placed somewhat deep. He was resumed on home Coumadin.   When called after his surgery he complained of bilateral ankle pain similar to previous gout flares. He started colchicine with improvement.  Today the patient presents to clinic for follow up. He is here with his wife. He has felt very fatigued. Sometimes lightheaded. No syncope. Has had some  worsening LE edema. He had felt like he had an "ace bandage" around his chest prior to surgery that has resolved.   Past Medical History:  Diagnosis Date   Anxiety    Asthma    "no attack since acupuncture in 1990's"   Atrial fibrillation (HCC)    Complication of anesthesia    SLOW TO WAKE UP / DIFFICULTY RESPONDING 2003, 3 days before could use left arm, "wild/crazy sometimes"   COPD (chronic obstructive pulmonary disease) (HCC)    Cyst of left kidney    "benign"   DVT (deep venous thrombosis) (HCC) 06/23/2001   right femoral artery "from groin to knee"   Edema    Right Leg w/ h/o DVT postphlebitic   H/O blood transfusion reaction 06/23/2002   FFP, extreme swelling, could not breath   History of pulmonary embolism 06/23/2001   both lungs   Hyperlipidemia    Knee pain    left   LBP (low back pain)    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, clear in 2012"   Obesity    Osteoarthritis    Peripheral vascular disease (HCC)    "poor circulation in  RT leg"   S/P TAVR (transcatheter aortic valve replacement) 01/06/2023   s/p TAVR with a 34 mm Evolut FX via the TF approach by  Dr. Lynnette Caffey and Dr. Leafy Ro   Severe aortic stenosis    Spondylolisthesis    Warfarin anticoagulation    Wheezing    when laying on left side    Past Surgical History:  Procedure Laterality Date   APPENDECTOMY  06/24/1991   Dr Jamey Ripa   CATARACT EXTRACTION W/ INTRAOCULAR LENS IMPLANT Bilateral    May/june 2024   CHOLECYSTECTOMY  06/24/1991   FINGER SURGERY  06/23/2010   RT THUMB RECONSTRUCTION   HAND SURGERY Right    abcess   HEMORROIDECTOMY  06/24/1979   I & D KNEE WITH POLY EXCHANGE Left 04/04/2013   Procedure: IRRIGATION AND DEBRIDEMENT KNEE WITH POLY EXCHANGE AND INSERTION OF CEMENT BEADS;  Surgeon: Loanne Drilling, MD;  Location: [facility];  Service: Orthopedics;  Laterality: Left;   INTRAOPERATIVE TRANSTHORACIC ECHOCARDIOGRAM N/A 01/06/2023   Procedure: INTRAOPERATIVE TRANSTHORACIC  ECHOCARDIOGRAM;  Surgeon: [name], MD;  Location: [facility] INVAS[facility];  Service: Open Heart Surgery;  Laterality: N/A;   KNEE ARTHROSCOPY  06/23/1988   RT KNEE   KNEE ARTHROSCOPY Right 06/23/2001   KNEE ARTHROSCOPY  06/23/1986   L KNEE   KNEE ARTHROSCOPY WITH MENISCAL REPAIR Left 10/22/2012   Procedure: LEFT KNEE ARTHROSCOPY PARTIAL MEDIAL AND LATERAL MENISCECTOMY AND DEBRIDEMENT, CHONDROPLASTY ;  Surgeon: Javier Docker, MD;  Location: [facility];  Service: Orthopedics;  Laterality: Left;   LUMBAR DISC SURGERY     LUMBAR FUSION  06/23/2001   Dr Jillyn Hidden   ORCHIECTOMY  06/23/1992   R post injury   RIGHT HEART CATH AND CORONARY ANGIOGRAPHY N/A 12/16/2022   Procedure: RIGHT HEART CATH AND CORONARY ANGIOGRAPHY;  Surgeon: [name], MD;  Location: [facility];  Service: Cardiovascular;  Laterality: N/A;   TONSILLECTOMY  06/23/1965   TOTAL KNEE ARTHROPLASTY Left 03/21/2013   Procedure: LEFT TOTAL KNEE ARTHROPLASTY;  Surgeon: Loanne Drilling, MD;  Location: [facility];  Service: Orthopedics;  Laterality: Left;   TRANSCATHETER AORTIC VALVE REPLACEMENT, TRANSFEMORAL Right 01/06/2023   Procedure: Transcatheter Aortic Valve Replacement, Transfemoral;  Surgeon: [name], MD;  Location: [facility];  Service: Open Heart Surgery;  Laterality: Right;    Current Medications: Current Meds  Medication Sig   albuterol (VENTOLIN HFA) 108 (90 Base) MCG/ACT inhaler Inhale 2 puffs into the lungs every 6 (six) hours as needed for wheezing or shortness of breath.   allopurinol (ZYLOPRIM) 100 MG tablet Take 1 tablet (100 mg total) by mouth daily.   celecoxib (CELEBREX) 200 MG capsule Take 1 capsule (200 mg total) by mouth 2 (two) times daily. (Patient taking differently: Take 200 mg by mouth 2 (two) times daily as needed for moderate pain.)   cetirizine (ZYRTEC) 10 MG tablet Take 1 tablet by mouth once daily (Patient taking differently: Take 10 mg by mouth at bedtime.)   Cholecalciferol  (VITAMIN D3) 125 MCG (5000 UT) CAPS Take 1 capsule (5,000 Units total) by mouth daily.   colchicine 0.6 MG tablet Take two tablets prn gout attack.Then take another one in 1-2 hrs. Do not repeat for 3 days.   doxycycline (MONODOX) 100 MG capsule Take 1 capsule (100 mg total) by mouth as directed. 1 hour before any dental work, including cleanings.   famotidine (PEPCID) 40 MG tablet Take 1 tablet by mouth once daily (Patient taking differently: Take 40 mg by mouth daily as needed for heartburn or indigestion.)   fish oil-omega-3 fatty acids 1000 MG capsule Take 1 g by mouth every morning.  furosemide (LASIX) 20 MG tablet TAKE LASIX 40 MG TWICE DAILY FOR THREE DAYS, THEN DECREASE TO 40 MG IN THE AM AND 20 MG IN THE PM BY MOUTH   glucosamine-chondroitin 500-400 MG tablet Take 1 tablet by mouth every morning.   mupirocin ointment (BACTROBAN) 2 % Place 1 Application into the nose 2 (two) times daily.   nortriptyline (PAMELOR) 25 MG capsule TAKE 1 CAPSULE AT BEDTIME   potassium chloride (KLOR-CON) 10 MEQ tablet START POTASSIUM 10 MG TWICE DAILY FOR 3 DAYS, THEN DECREASE TO 10 MG DAILY.   predniSONE (DELTASONE) 5 MG tablet TAKE 1 TABLET DAILY WITH BREAKFAST   pregabalin (LYRICA) 75 MG capsule Take 1 capsule (75 mg total) by mouth 3 (three) times daily as needed.   triamcinolone cream (KENALOG) 0.1 % Apply 1 Application topically 3 (three) times daily. (Patient taking differently: Apply 1 Application topically daily as needed (irritation).)   warfarin (COUMADIN) 3 MG tablet TAKE 1 TABLET DAILY OR TAKE AS DIRECTED BY ANTICOAGULATION CLINIC (Patient taking differently: Take 3 mg by mouth See admin instructions. TAKE 1 TABLET DAILY OR TAKE AS DIRECTED BY ANTICOAGULATION CLINIC; 6 mg (6 mg x 1) every Fri; 3 mg (6 mg x 0.5) all other days)   warfarin (COUMADIN) 6 MG tablet TAKE 1/2 TABLET BY MOUTH DAILY EXCEPT TAKE 1 TABLET ON FRIDAYS OR AS DIRECTED BY ANTICOAGULATION CLINIC (Patient taking differently: Take 6 mg  by mouth See admin instructions. TAKE 1/2 TABLET BY MOUTH DAILY EXCEPT TAKE 1 TABLET ON FRIDAYS OR AS DIRECTED BY ANTICOAGULATION CLINIC; 6 mg (6 mg x 1) every Fri; 3 mg (6 mg x 0.5) all other days)   [DISCONTINUED] furosemide (LASIX) 20 MG tablet Take 1 tablet (20 mg total) by mouth 2 (two) times daily.     Allergies:   Cardizem [diltiazem], Lopressor [metoprolol tartrate], Sulfa antibiotics, Amiodarone, Anoro ellipta [umeclidinium-vilanterol], Breo ellipta [fluticasone furoate-vilanterol], Exalgo [hydromorphone hcl], Gabapentin, Hydrocodone-acetaminophen, Penicillins, Sulfonamide derivatives, Tylenol [acetaminophen], and Cat hair extract   Social History   Socioeconomic History   Marital status: Married    Spouse name: Not on file   Number of children: 3   Years of education: Not on file   Highest education level: Bachelor's degree (e.g., BA, AB, BS)  Occupational History   Occupation: Disabled    Employer: DISABLED  Tobacco Use   Smoking status: Never   Smokeless tobacco: Never  Vaping Use   Vaping status: Never Used  Substance and Sexual Activity   Alcohol use: Never   Drug use: Never   Sexual activity: Yes  Other Topics Concern   Not on file  Social History Narrative   Right handed   One story home   Drinks caffeine coffee q am   Social Determinants of Health   Financial Resource Strain: Low Risk  (01/20/2022)   Overall Financial Resource Strain (CARDIA)    Difficulty of Paying Living Expenses: Not hard at all  Food Insecurity: Patient Declined (10/13/2022)   Hunger Vital Sign    Worried About Running Out of Food in the Last Year: Patient declined    Ran Out of Food in the Last Year: Patient declined  Transportation Needs: No Transportation Needs (10/13/2022)   PRAPARE - Administrator, Civil Service (Medical): No    Lack of Transportation (Non-Medical): No  Physical Activity: Insufficiently Active (10/13/2022)   Exercise Vital Sign    Days of Exercise per  Week: 3 days    Minutes of Exercise per Session:  30 min  Stress: No Stress Concern Present (01/20/2022)   [employer] of Occupational Health - Occupational Stress Questionnaire    Feeling of Stress : Not at all  Social Connections: Unknown (10/13/2022)   Social Connection and Isolation Panel [NHANES]    Frequency of Communication with Friends and Family: Not on file    Frequency of Social Gatherings with Friends and Family: Not on file    Attends Religious Services: Patient declined    Active Member of Clubs or Organizations: Not on file    Attends Banker Meetings: Not on file    Marital Status: Married     Family History: The patient's family history includes Heart disease in his mother and sister; Hyperlipidemia in an other family member; Hypertension in an other family member; Parkinsonism in an other family member.  ROS:   Please see the history of present illness.    All other systems reviewed and are negative.  EKGs/Labs/Other Studies Reviewed:    Cardiac Studies & Procedures   CARDIAC CATHETERIZATION  CARDIAC CATHETERIZATION 12/16/2022  Narrative   Ost RCA to Prox RCA lesion is 20% stenosed.   Mid LAD lesion is 40% stenosed.  1.  Mild obstructive coronary artery disease. 2.  Fick cardiac output of 7.5 L/min and Fick cardiac index of 2.9 L/min/m with the following hemodynamics: Right atrial pressure mean of 15 mmHg Right ventricular pressure 47/8 with end-diastolic pressure of 15 mmHg Wedge pressure mean 23 mmHg PA pressure 46/23 with a mean of 34 mmHg.  Recommendation: Continue evaluation for aortic valve intervention; increase Lasix to 20 mg twice daily and restart Coumadin tonight.  Findings Coronary Findings Diagnostic  Dominance: Right  Left Anterior Descending There is mild diffuse disease throughout the vessel. Mid LAD lesion is 40% stenosed.  Left Circumflex The vessel exhibits minimal luminal irregularities.  Right Coronary  Artery The vessel exhibits minimal luminal irregularities. Ost RCA to Prox RCA lesion is 20% stenosed.  Intervention  No interventions have been documented.   STRESS TESTS  NM PET CT CARDIAC PERFUSION MULTI W/ABSOLUTE BLOODFLOW 11/26/2022  Narrative   LV perfusion is normal. There is no evidence of ischemia. There is no evidence of infarction.   Rest left ventricular function is abnormal. Rest global function is moderately reduced. There were no regional wall motion abnormalities. Rest EF: 40 %. Stress left ventricular function is abnormal. Stress global function is moderately reduced. There were no regional wall abnormalities. Stress EF: 33 %. End diastolic cavity size is mildly enlarged. End systolic cavity size is mildly enlarged.   Myocardial blood flow was computed to be 0.52ml/g/min at rest and 1.60ml/g/min at stress. Global myocardial blood flow reserve was 1.92 and was mildly abnormal.   Coronary calcium was present on the attenuation correction CT images. Severe coronary calcifications were present. Coronary calcifications were present in the left anterior descending artery and right coronary artery distribution(s).   Findings are equivocal. The study is intermediate risk.   Despite normal perfusion study considered intermediate to high risk. Resting EF abnormal and decreases with stress. TID 1.3 with abnormal MBF and severe calcium in LAD and RCA Baseline heart rhythm atrial fibrillation  CLINICAL DATA:  This over-read does not include interpretation of cardiac or coronary anatomy or pathology. The Cardiac PET CT interpretation by the cardiologist is attached.  COMPARISON:  None Available.  FINDINGS: Vascular: No acute abnormality.  Mediastinum/Nodes: Decreased AP diameter of the trachea and bilateral mainstem bronchi which may be seen with chronic tracheobronchomalacia.  Lymph node posterior to the right superior pulmonary vein measures 1.5 cm, image 44/3  Lungs/Pleura: No  pleural effusion, airspace consolidation or pneumothorax. Bilateral peripheral predominant interstitial reticulation and subpleural banding identified. No frank honeycombing or bronchiectasis identified. No suspicious pulmonary nodule or mass.  Upper Abdomen: Small cyst in left lobe of liver measures 1 cm. No acute abnormality.  Musculoskeletal: Spondylosis identified within the thoracic spine. No acute or suspicious findings.  IMPRESSION: 1. Upper limits of normal left posterior mediastinal lymph node measures 1.5 cm. 2. Decreased AP diameter of the trachea and bilateral mainstem bronchi which may be seen with chronic tracheobronchomalacia. 3. Chronic postinflammatory changes noted within both lungs including subpleural banding and interstitial reticulation.   Electronically Signed By: Signa Kell M.D. On: 11/26/2022 08:41   ECHOCARDIOGRAM  ECHOCARDIOGRAM COMPLETE 01/07/2023  Narrative ECHOCARDIOGRAM REPORT    Patient Name:   NIKAN RATHGEB Date of Exam: 01/07/2023 Medical Rec #:  [id number]         Height:       71.0 in Accession #:    [id number]        Weight:       317.7 lb Date of Birth:  17-Mar-1951         BSA:          2.567 m Patient Age:    72 years          BP:           152/75 mmHg Patient Gender: M                 HR:           104 bpm. Exam Location:  Inpatient  Procedure: 2D Echo, Color Doppler and Cardiac Doppler  Indications:    S/P TAVR  History:        Patient has prior history of Echocardiogram examinations, most recent 01/06/2023. COPD, Aortic Valve Disease; Arrythmias:Atrial Fibrillation. Aortic Valve: 34 Medtronic CoreValve-Evolut Pro prosthetic, stented (TAVR) valve is present in the aortic position. Procedure Date: 01/06/23.  Sonographer:    Darlys Gales Referring Phys: [id number] Savalas Monje R Sowmya Partridge  IMPRESSIONS   1. Abnormal septal motion Patient in afib during study. Left ventricular ejection fraction, by estimation, is 50 to 55%. The  left ventricle has low normal function. The left ventricle has no regional wall motion abnormalities. Left ventricular diastolic parameters are indeterminate. 2. Right ventricular systolic function is mildly reduced. The right ventricular size is mildly enlarged. 3. The mitral valve is abnormal. Trivial mitral valve regurgitation. No evidence of mitral stenosis. 4. 34 mm Evolute Pro valve placed somewhat deep. No PVL mean gradient 7 peak 11 mmHg AVA 1.5 cm2. The aortic valve has been repaired/replaced. Aortic valve regurgitation is not visualized. No aortic stenosis is present. There is a 34 Medtronic CoreValve-Evolut Pro prosthetic (TAVR) valve present in the aortic position. Procedure Date: 01/06/23. 5. The inferior vena cava is normal in size with greater than 50% respiratory variability, suggesting right atrial pressure of 3 mmHg.  FINDINGS Left Ventricle: Abnormal septal motion Patient in afib during study. Left ventricular ejection fraction, by estimation, is 50 to 55%. The left ventricle has low normal function. The left ventricle has no regional wall motion abnormalities. The left ventricular internal cavity size was normal in size. There is no left ventricular hypertrophy. Left ventricular diastolic parameters are indeterminate.  Right Ventricle: The right ventricular size is mildly enlarged. No increase in right ventricular wall thickness. Right ventricular systolic function is  mildly reduced.  Left Atrium: Left atrial size was normal in size.  Right Atrium: Right atrial size was normal in size.  Pericardium: There is no evidence of pericardial effusion.  Mitral Valve: The mitral valve is abnormal. There is mild thickening of the mitral valve leaflet(s). Trivial mitral valve regurgitation. No evidence of mitral valve stenosis. MV peak gradient, 8.0 mmHg. The mean mitral valve gradient is 4.0 mmHg.  Tricuspid Valve: The tricuspid valve is normal in structure. Tricuspid valve  regurgitation is not demonstrated. No evidence of tricuspid stenosis.  Aortic Valve: 34 mm Evolute Pro valve placed somewhat deep. No PVL mean gradient 7 peak 11 mmHg AVA 1.5 cm2. The aortic valve has been repaired/replaced. Aortic valve regurgitation is not visualized. No aortic stenosis is present. Aortic valve mean gradient measures 10.2 mmHg. Aortic valve peak gradient measures 17.2 mmHg. Aortic valve area, by VTI measures 1.60 cm. There is a 34 Medtronic CoreValve-Evolut Pro prosthetic, stented (TAVR) valve present in the aortic position. Procedure Date: 01/06/23.  Pulmonic Valve: The pulmonic valve was normal in structure. Pulmonic valve regurgitation is not visualized. No evidence of pulmonic stenosis.  Aorta: The aortic root is normal in size and structure.  Venous: The inferior vena cava is normal in size with greater than 50% respiratory variability, suggesting right atrial pressure of 3 mmHg.  IAS/Shunts: The interatrial septum was not well visualized.   LEFT VENTRICLE PLAX 2D LVIDd:         5.40 cm   Diastology LVIDs:         4.10 cm   LV e' medial:   7.72 cm/s LV PW:         1.40 cm   LV E/e' medial: 18.9 LV IVS:        1.10 cm LVOT diam:     2.00 cm LV SV:         53 LV SV Index:   21 LVOT Area:     3.14 cm   RIGHT VENTRICLE             IVC RV S prime:     12.10 cm/s  IVC diam: 1.00 cm TAPSE (M-mode): 2.0 cm  LEFT ATRIUM              Index        RIGHT ATRIUM           Index LA Vol (A2C):   78.5 ml  30.58 ml/m  RA Area:     26.60 cm LA Vol (A4C):   120.0 ml 46.75 ml/m  RA Volume:   85.40 ml  33.27 ml/m LA Biplane Vol: 101.0 ml 39.35 ml/m AORTIC VALVE AV Area (Vmax):    1.67 cm AV Area (Vmean):   1.66 cm AV Area (VTI):     1.60 cm AV Vmax:           207.40 cm/s AV Vmean:          148.400 cm/s AV VTI:            0.331 m AV Peak Grad:      17.2 mmHg AV Mean Grad:      10.2 mmHg LVOT Vmax:         110.40 cm/s LVOT Vmean:        78.540 cm/s LVOT VTI:           0.168 m LVOT/AV VTI ratio: 0.51  AORTA Ao Asc diam: 3.60 cm  MITRAL VALVE  TRICUSPID VALVE MV Area (PHT): 2.85 cm     TR Peak grad:   19.0 mmHg MV Area VTI:   2.39 cm     TR Vmax:        218.00 cm/s MV Peak grad:  8.0 mmHg MV Mean grad:  4.0 mmHg     SHUNTS MV Vmax:       1.41 m/s     Systemic VTI:  0.17 m MV Vmean:      99.0 cm/s    Systemic Diam: 2.00 cm MV Decel Time: 266 msec MV E velocity: 146.00 cm/s  Charlton Haws MD Electronically signed by Charlton Haws MD Signature Date/Time: 01/07/2023/10:14:39 AM    Final    MONITORS  LONG TERM MONITOR (3-14 DAYS) 05/06/2018  Narrative Patient had a min HR of 53 bpm, max HR of 214 bpm, and avg HR of 89 bpm. Predominant underlying rhythm was Sinus Rhythm. There were 9 patient triggered events, and Supraventricular Tachycardia was detected within +/- 45 seconds of symptomatic patient event(s).  Monitor noted 6 episodes of wide complex tachycardia; these are labeled as VT but are different morphology from the majority of his PVCs and bigeminy/trigeminy. Could be VT vs. SVT with aberrancy, given that these events occurred at a faster heart rate than his predominant SVT. The fastest WCT lasted 15 beats with a max rate of 197 bpm (avg 164 bpm); the run with the fastest interval was also the longest.  4752 narrow complex Supraventricular Tachycardia runs occurred, the run with the fastest interval lasting 1 min 37 secs with a max rate of 214 bpm, the longest lasting 56 mins 49 secs with an avg rate of 126 bpm. Some episodes of Supraventricular Tachycardia conducted with possible aberrancy. Isolated SVEs were frequent (10.9%, [id number]), SVE Couplets were rare (<1.0%, 7844), and SVE Triplets were rare (<1.0%, 793). Isolated VEs were occasional (2.1%, [id number]), VE Couplets were rare (<1.0%, 338), and VE Triplets were rare (<1.0%, 5). Ventricular Bigeminy and Trigeminy were present.   CT SCANS  CT CORONARY MORPH W/CTA COR W/SCORE  12/22/2022  Addendum 12/23/2022  7:12 AM ADDENDUM REPORT: 12/23/2022 07:10  CLINICAL DATA:  Aortic valve replacement (TAVR), pre-op eval  EXAM: Cardiac TAVR CT  TECHNIQUE: The patient was scanned on a Siemens Force 192 slice scanner. A 120 kV retrospective scan was triggered in the descending thoracic aorta at 111 HU's. Gantry rotation speed was 270 msecs and collimation was .9 mm. The 3D data set was reconstructed in 5% intervals of the R-R cycle. Systolic and diastolic phases were analyzed on a dedicated work station using MPR, MIP and VRT modes. The patient received OMNIPAQUE IOHEXOL 350 MG/ML SOLN of contrast.  FINDINGS: Image quality: Average, reduced CNR.  Reduced SNR.  Aortic Valve:  Tricuspid aortic valve with severely reduced cusp excursion. Severely thickened and severely calcified aortic valve cusps.  AV calcium score: 2591  Virtual Basal Annulus Measurements:  Maximum/Minimum Diameter: 29.8 x 23.2 mm  Perimeter:  85 mm  Area:  552 mm2  No significant LVOT calcifications.  Membranous septal length: 6.7 mm  Based on these measurements, the annulus would be suitable for a 29 mm Sapien 3 valve. Alternatively, Heart Team can consider 34 mm Evolut valve. Recommend Heart Team discussion for valve selection.  Sinus of Valsalva Measurements:  Non-coronary:  37 mm  Right - coronary:  36 mm  Left - coronary:  36 mm  Sinus of Valsalva Height:  Left: 27.3 mm  Right: 25.3 mm  Aorta: Short segment common origin of brachiocephalic and left common carotid arteries off of aortic arch.  Sinotubular Junction:  31 mm  Ascending Thoracic Aorta:  36 mm  Aortic Arch:  31 mm  Descending Thoracic Aorta:  27 mm  Coronary Artery Height above Annulus:  Left main: 16.8 mm  Right coronary: 19.3 mm  Coronary Arteries: Normal coronary origin. Right dominance. The study was performed without use of NTG and insufficient for plaque evaluation. Coronary artery  calcium seen in 3 vessel distribution.  Optimum Fluoroscopic Angle for Delivery:  LAO 3 CAU 3  OTHER:  Atria:  Left atrial appendage: No thrombus.  Mitral valve: Grossly normal, mild mitral annular calcifications.  Pulmonary artery: Normal caliber.  Pulmonary veins: Normal anatomy.  IMPRESSION: 1. 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 Severely thickened and severely calcified aortic valve cusps. 2. Aortic valve calcium score: 2591 3. Annulus area: 552 mm2, suitable for 29 mm Sapien 3 valve. No LVOT calcifications. Membranous septal length 6.7 mm. 4. Sufficient coronary artery heights from annulus. 5. Optimum fluoroscopic angle for delivery: LAO 3 CAU 3   Electronically Signed By: Weston Brass M.D. On: 12/23/2022 07:10  Narrative EXAM: OVER-READ INTERPRETATION  CT CHEST  The following report is a limited chest CT over-read performed by radiologist Dr. Allegra Lai of [facility] on 12/22/2022. This over-read does not include interpretation of cardiac or coronary anatomy or pathology. The cardiac TAVR interpretation by the cardiologist is attached.  COMPARISON:  None Available.  FINDINGS: Extracardiac findings will be described separately under dictation for contemporaneously obtained CTA chest, abdomen and pelvis.  IMPRESSION: Please see separate dictation for contemporaneously obtained CTA chest, abdomen and pelvis dated 12/22/2022 for full description of relevant extracardiac findings.  Electronically Signed: By: Allegra Lai M.D. On: 12/22/2022 12:56          EKG:  EKG is  ordered today.  The ekg ordered today demonstrates afib with LBBB.   Recent Labs: 03/17/2022: TSH 4.49 01/02/2023: ALT 23 01/07/2023: BUN 13; Creatinine, Ser 1.13; Hemoglobin 15.2; Magnesium 1.7; Platelets 219; Potassium 3.4; Sodium 135  Recent Lipid Panel    Component Value Date/Time   CHOL 191 03/17/2022 0927   TRIG 168.0 (H) 03/17/2022  0927   TRIG 118 04/30/2006 0841   HDL 36.20 (L) 03/17/2022 0927   CHOLHDL 5 03/17/2022 0927   VLDL 33.6 03/17/2022 0927   LDLCALC 121 (H) 03/17/2022 0927   LDLDIRECT 137.0 03/10/2017 0842     Risk Assessment/Calculations:    CHA2DS2-VASc Score = 3   This indicates a 3.2% annual risk of stroke. The patient's score is based upon: CHF History: 1 HTN History: 1 Diabetes History: 0 Stroke History: 0 Vascular Disease History: 0 Age Score: 1 Gender Score: 0           Physical Exam:    VS:  BP 120/72   Pulse 90   Ht 5\' 11"  (1.803 m)   Wt (!) 313 lb 9.6 oz (142.2 kg)   SpO2 96%   BMI 43.74 kg/m     Wt Readings from Last 3 Encounters:  01/14/23 (!) 313 lb 9.6 oz (142.2 kg)  01/07/23 (!) 317 lb 10.9 oz (144.1 kg)  12/16/22 (!) 318 lb (144.2 kg)     GEN:  Well nourished, well developed in no acute distress, obese HEENT: Normal NECK: No JVD LYMPHATICS: No lymphadenopathy CARDIAC: irreg irreg, no murmurs, rubs, gallops RESPIRATORY:  Clear to auscultation without rales, wheezing or rhonchi  ABDOMEN: Soft, non-tender, non-distended MUSCULOSKELETAL: 1+ bilateral LE edema. No deformity  SKIN: Warm and dry.  Groin sites clear without hematoma or ecchymosis  NEUROLOGIC:  Alert and oriented x 3 PSYCHIATRIC:  Normal affect   ASSESSMENT:    1. S/P TAVR (transcatheter aortic valve replacement)   2. Chronic diastolic CHF (congestive heart failure) (HCC)   3. Permanent atrial fibrillation (HCC)   4. Essential hypertension   5. Renal lesion   6. Other specified disorders of kidney and ureter    PLAN:    In order of problems listed above:  Severe AS s/p TAVR: doing well 1 week out from TAVR. ECG with no HAVB. Groin sites healing well. Continue on Coumadin. SBE prophylaxis discussed; I have RX'd doxycycline due to a PCN allergy. I will see him back in 1 month for follow up and echo.    Chronic diastolic CHF: he looks compensated but has some worsening LE edema. Increase  Lasix 20 mg BID to 40mg  BID and Kdur 10 meq BID  x 3 days and then go back to Lasix 40mg  in AM and 20 in PM with Kdur 10 meq daily. Plan for repeat BMET in 2 weeks.    Persistent atrial fibrillation:  he is rate controlled without any AV nodal blocking agents. Per review of Dr. Di Kindle last note, he cannot tolerate diltiazem or beta blockers and has declined cardioversion in the past. We talked about restoring sinus rhythm today and he will think about it. Continue coumadin. INRs followed by PCP.   HTN: Bp well controlled. No changes made.    Renal lesion: pre TAVR CTs showed "indeterminate 3.5 cm lesion of the upper pole of the left kidney. Recommend further evaluation contrast-enhanced abdominal MRI." I will order this.      Cardiac Rehabilitation Eligibility Assessment  The patient is ready to start cardiac rehabilitation from a cardiac standpoint.     Medication Adjustments/Labs and Tests Ordered: Current medicines are reviewed at length with the patient today.  Concerns regarding medicines are outlined above.  Orders Placed This Encounter  Procedures   MR ABDOMEN WWO CONTRAST   Basic metabolic panel   EKG 12-Lead   Meds ordered this encounter  Medications   doxycycline (MONODOX) 100 MG capsule    Sig: Take 1 capsule (100 mg total) by mouth as directed. 1 hour before any dental work, including cleanings.    Dispense:  6 capsule    Refill:  6    Order Specific Question:   Supervising Provider    Answer:   COOPER, MICHAEL [[id number]]   furosemide (LASIX) 20 MG tablet    Sig: TAKE LASIX 40 MG TWICE DAILY FOR THREE DAYS, THEN DECREASE TO 40 MG IN THE AM AND 20 MG IN THE PM BY MOUTH    Dispense:  270 tablet    Refill:  3   potassium chloride (KLOR-CON) 10 MEQ tablet    Sig: START POTASSIUM 10 MG TWICE DAILY FOR 3 DAYS, THEN DECREASE TO 10 MG DAILY.    Dispense:  90 tablet    Refill:  3    Patient Instructions  Medication Instructions:  Your physician has recommended you  make the following change in your medication:  INCREASE LASIX 40 MG TWICE DAILY FOR THREE DAYS, THEN DECREASE TO 40 MG IN THE AM AND 20 MG IN THE PM. START POTASSIUM 10 MG TWICE DAILY FOR 3 DAYS, THEN DECREASE TO 10 MG DAILY. START DOXYCYCLINE 100 MG 1 HOUR PRIOR TO ANT  DENTAL CLEANINGS AND PROCURES.  *If you need a refill on your cardiac medications before your next appointment, please call your pharmacy*   Lab Work: TO BE DONE IN 2 WEEKS: BMET  If you have labs (blood work) drawn today and your tests are completely normal, you will receive your results only by: MyChart Message (if you have MyChart) OR A paper copy in the mail If you have any lab test that is abnormal or we need to change your treatment, we will call you to review the results.   Testing/Procedures: YOUR PROVIDER RECOMMENDS THAT YOU HAVE A MRI.   Follow-Up: At [facility], you and your health needs are our priority.  As part of our continuing mission to provide you with exceptional heart care, we have created designated Provider Care Teams.  These Care Teams include your primary Cardiologist (physician) and Advanced Practice Providers (APPs -  Physician Assistants and Nurse Practitioners) who all work together to provide you with the care you need, when you need it.  We recommend signing up for the patient portal called "MyChart".  Sign up information is provided on this After Visit Summary.  MyChart is used to connect with patients for Virtual Visits (Telemedicine).  Patients are able to view lab/test results, encounter notes, upcoming appointments, etc.  Non-urgent messages can be sent to your provider as well.   To learn more about what you can do with MyChart, go to [email or web address].    Your next appointment:   KEEP SCHEDULED FOLLOW-UP   Signed, Cline Crock, PA-C  01/14/2023 2:48 PM    [facility]

## 2023-01-14 ENCOUNTER — Ambulatory Visit: Payer: Medicare Other | Attending: Internal Medicine | Admitting: Physician Assistant

## 2023-01-14 VITALS — BP 120/72 | HR 90 | Ht 71.0 in | Wt 313.6 lb

## 2023-01-14 DIAGNOSIS — Z952 Presence of prosthetic heart valve: Secondary | ICD-10-CM

## 2023-01-14 DIAGNOSIS — I5032 Chronic diastolic (congestive) heart failure: Secondary | ICD-10-CM

## 2023-01-14 DIAGNOSIS — N289 Disorder of kidney and ureter, unspecified: Secondary | ICD-10-CM

## 2023-01-14 DIAGNOSIS — I4821 Permanent atrial fibrillation: Secondary | ICD-10-CM | POA: Diagnosis not present

## 2023-01-14 DIAGNOSIS — M545 Low back pain, unspecified: Secondary | ICD-10-CM

## 2023-01-14 DIAGNOSIS — I10 Essential (primary) hypertension: Secondary | ICD-10-CM

## 2023-01-14 DIAGNOSIS — N2889 Other specified disorders of kidney and ureter: Secondary | ICD-10-CM | POA: Diagnosis not present

## 2023-01-14 MED ORDER — POTASSIUM CHLORIDE ER 10 MEQ PO TBCR: EXTENDED_RELEASE_TABLET | ORAL | 3 refills | Status: DC

## 2023-01-14 MED ORDER — DOXYCYCLINE MONOHYDRATE 100 MG PO CAPS: 100.0000 mg | ORAL_CAPSULE | ORAL | 6 refills | Status: DC

## 2023-01-14 MED ORDER — FUROSEMIDE 20 MG PO TABS: ORAL_TABLET | ORAL | 3 refills | Status: DC

## 2023-01-14 NOTE — Telephone Encounter (Signed)
Noted! Thank you

## 2023-01-14 NOTE — Patient Instructions (Addendum)
Medication Instructions:  Your physician has recommended you make the following change in your medication:  INCREASE LASIX 40 MG TWICE DAILY FOR THREE DAYS, THEN DECREASE TO 40 MG IN THE AM AND 20 MG IN THE PM. START POTASSIUM 10 MG TWICE DAILY FOR 3 DAYS, THEN DECREASE TO 10 MG DAILY. START DOXYCYCLINE 100 MG 1 HOUR PRIOR TO ANT DENTAL CLEANINGS AND PROCURES.  *If you need a refill on your cardiac medications before your next appointment, please call your pharmacy*   Lab Work: TO BE DONE IN 2 WEEKS: BMET  If you have labs (blood work) drawn today and your tests are completely normal, you will receive your results only by: MyChart Message (if you have MyChart) OR A paper copy in the mail If you have any lab test that is abnormal or we need to change your treatment, we will call you to review the results.   Testing/Procedures: YOUR PROVIDER RECOMMENDS THAT YOU HAVE A MRI.   Follow-Up: At [facility], you and your health needs are our priority.  As part of our continuing mission to provide you with exceptional heart care, we have created designated Provider Care Teams.  These Care Teams include your primary Cardiologist (physician) and Advanced Practice Providers (APPs -  Physician Assistants and Nurse Practitioners) who all work together to provide you with the care you need, when you need it.  We recommend signing up for the patient portal called "MyChart".  Sign up information is provided on this After Visit Summary.  MyChart is used to connect with patients for Virtual Visits (Telemedicine).  Patients are able to view lab/test results, encounter notes, upcoming appointments, etc.  Non-urgent messages can be sent to your provider as well.   To learn more about what you can do with MyChart, go to [email or web address].    Your next appointment:   KEEP SCHEDULED FOLLOW-UP

## 2023-01-15 ENCOUNTER — Ambulatory Visit (INDEPENDENT_AMBULATORY_CARE_PROVIDER_SITE_OTHER): Payer: MEDICARE | Admitting: Internal Medicine

## 2023-01-15 ENCOUNTER — Encounter: Payer: Self-pay | Admitting: Internal Medicine

## 2023-01-15 ENCOUNTER — Encounter (HOSPITAL_COMMUNITY): Payer: Self-pay

## 2023-01-15 VITALS — BP 118/70 | HR 86 | Temp 98.0°F | Ht 71.0 in | Wt 314.0 lb

## 2023-01-15 DIAGNOSIS — E538 Deficiency of other specified B group vitamins: Secondary | ICD-10-CM | POA: Diagnosis not present

## 2023-01-15 DIAGNOSIS — M545 Low back pain, unspecified: Secondary | ICD-10-CM

## 2023-01-15 DIAGNOSIS — I825Y3 Chronic embolism and thrombosis of unspecified deep veins of proximal lower extremity, bilateral: Secondary | ICD-10-CM

## 2023-01-15 DIAGNOSIS — G8929 Other chronic pain: Secondary | ICD-10-CM | POA: Diagnosis not present

## 2023-01-15 DIAGNOSIS — I4891 Unspecified atrial fibrillation: Secondary | ICD-10-CM | POA: Diagnosis not present

## 2023-01-15 DIAGNOSIS — Z952 Presence of prosthetic heart valve: Secondary | ICD-10-CM

## 2023-01-15 DIAGNOSIS — I4892 Unspecified atrial flutter: Secondary | ICD-10-CM

## 2023-01-15 NOTE — Assessment & Plan Note (Addendum)
Rate controlled Anticoagulated w/Coumadin

## 2023-01-15 NOTE — Progress Notes (Signed)
Subjective:  Patient ID: Robert [name], male    DOB: 1950-08-07  Age: 72 y.o. 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    Outpatient Medications Prior to Visit  Medication Sig Dispense Refill   albuterol (VENTOLIN HFA) 108 (90 Base) MCG/ACT inhaler Inhale 2 puffs into the lungs every 6 (six) hours as needed for wheezing or shortness of breath. 6.7 g 0   allopurinol (ZYLOPRIM) 100 MG tablet Take 1 tablet (100 mg total) by mouth daily. 90 tablet 1   celecoxib (CELEBREX) 200 MG capsule Take 1 capsule (200 mg total) by mouth 2 (two) times daily. (Patient taking differently: Take 200 mg by mouth 2 (two) times daily as needed for moderate pain.) 14 capsule 0   cetirizine (ZYRTEC) 10 MG tablet Take 1 tablet by mouth once daily (Patient taking differently: Take 10 mg by mouth at bedtime.) 30 tablet 5   Cholecalciferol (VITAMIN D3) 125 MCG (5000 UT) CAPS Take 1 capsule (5,000 Units total) by mouth daily. 100 capsule 3   colchicine 0.6 MG tablet Take two tablets prn gout attack.Then take another one in 1-2 hrs. Do not repeat for 3 days. 18 tablet 1   doxycycline (MONODOX) 100 MG capsule Take 1 capsule (100 mg total) by mouth as directed. 1 hour before any dental work, including cleanings. 6 capsule 6   famotidine (PEPCID) 40 MG tablet Take 1 tablet by mouth once daily (Patient taking differently: Take 40 mg by mouth daily as needed for heartburn or indigestion.) 30 tablet 5   fish oil-omega-3 fatty acids 1000 MG capsule Take 1 g by mouth every morning.     furosemide (LASIX) 20 MG tablet TAKE LASIX 40 MG TWICE DAILY FOR THREE DAYS, THEN DECREASE TO 40 MG IN THE AM AND 20 MG IN THE PM BY MOUTH 270 tablet 3   glucosamine-chondroitin 500-400 MG tablet Take 1 tablet by mouth every morning.     mupirocin ointment (BACTROBAN) 2 % Place 1 Application into the nose 2 (two) times  daily.     nortriptyline (PAMELOR) 25 MG capsule TAKE 1 CAPSULE AT BEDTIME 90 capsule 3   potassium chloride (KLOR-CON) 10 MEQ tablet START POTASSIUM 10 MG TWICE DAILY FOR 3 DAYS, THEN DECREASE TO 10 MG DAILY. 90 tablet 3   predniSONE (DELTASONE) 5 MG tablet TAKE 1 TABLET DAILY WITH BREAKFAST 90 tablet 3   pregabalin (LYRICA) 75 MG capsule Take 1 capsule (75 mg total) by mouth 3 (three) times daily as needed. 90 capsule 3   triamcinolone cream (KENALOG) 0.1 % Apply 1 Application topically 3 (three) times daily. (Patient taking differently: Apply 1 Application topically daily as needed (irritation).) 450 g 1   warfarin (COUMADIN) 3 MG tablet TAKE 1 TABLET DAILY OR TAKE AS DIRECTED BY ANTICOAGULATION CLINIC (Patient taking differently: Take 3 mg by mouth See admin instructions. TAKE 1 TABLET DAILY OR TAKE AS DIRECTED BY ANTICOAGULATION CLINIC; 6 mg (6 mg x 1) every Fri; 3 mg (6 mg x 0.5) all other days) 90 tablet 1   warfarin (COUMADIN) 6 MG tablet TAKE 1/2 TABLET BY MOUTH DAILY EXCEPT TAKE 1 TABLET ON FRIDAYS OR AS DIRECTED BY ANTICOAGULATION CLINIC (Patient taking differently: Take 6 mg by mouth See admin instructions. TAKE 1/2 TABLET BY MOUTH DAILY EXCEPT TAKE 1 TABLET ON FRIDAYS OR AS DIRECTED BY ANTICOAGULATION CLINIC;  6 mg (6 mg x 1) every Fri; 3 mg (6 mg x 0.5) all other days) 60 tablet 1   No facility-administered medications prior to visit.    ROS: Review of Systems  Constitutional:  Positive for fatigue. Negative for appetite change and unexpected weight change.  HENT:  Negative for congestion, nosebleeds, sneezing, sore throat and trouble swallowing.   Eyes:  Negative for itching and visual disturbance.  Respiratory:  Negative for cough and wheezing.   Cardiovascular:  Positive for leg swelling. Negative for chest pain and palpitations.  Gastrointestinal:  Negative for abdominal distention, blood in stool, diarrhea and nausea.  Genitourinary:  Negative for frequency and hematuria.   Musculoskeletal:  Positive for arthralgias and back pain. Negative for gait problem, joint swelling and neck pain.  Skin:  Negative for rash.  Neurological:  Negative for dizziness, tremors, speech difficulty and weakness.  Hematological:  Bruises/bleeds easily.  Psychiatric/Behavioral:  Negative for agitation, dysphoric mood, sleep disturbance and suicidal ideas. The patient is not nervous/anxious.     Objective:  BP 118/70 (BP Location: Left Arm, Patient Position: Sitting, Cuff Size: Large)   Pulse 86   Temp 98 F (36.7 C) (Oral)   Ht 5\' 11"  (1.803 m)   Wt (!) 314 lb (142.4 kg)   SpO2 95%   BMI 43.79 kg/m   BP Readings from Last 3 Encounters:  01/15/23 118/70  01/14/23 120/72  01/07/23 (!) 152/75    Wt Readings from Last 3 Encounters:  01/15/23 (!) 314 lb (142.4 kg)  01/14/23 (!) 313 lb 9.6 oz (142.2 kg)  01/07/23 (!) 317 lb 10.9 oz (144.1 kg)    Physical Exam Constitutional:      General: He is not in acute distress.    Appearance: He is well-developed. He is obese.     Comments: NAD  Eyes:     Conjunctiva/sclera: Conjunctivae normal.     Pupils: Pupils are equal, round, and reactive to light.  Neck:     Thyroid: No thyromegaly.     Vascular: No JVD.  Cardiovascular:     Rate and Rhythm: Normal rate. Rhythm irregular.     Heart sounds: Normal heart sounds. No murmur heard.    No friction rub. No gallop.  Pulmonary:     Effort: Pulmonary effort is normal. No respiratory distress.     Breath sounds: Normal breath sounds. No wheezing or rales.  Chest:     Chest wall: No tenderness.  Abdominal:     General: Bowel sounds are normal. There is no distension.     Palpations: Abdomen is soft. There is no mass.     Tenderness: There is no abdominal tenderness. There is no guarding or rebound.  Musculoskeletal:        General: Tenderness present. Normal range of motion.     Cervical back: Normal range of motion. Tenderness present.     Right lower leg: Edema  present.     Left lower leg: Edema present.  Lymphadenopathy:     Cervical: No cervical adenopathy.  Skin:    General: Skin is warm and dry.     Findings: No rash.  Neurological:     Mental Status: He is alert and oriented to person, place, and time.     Cranial Nerves: No cranial nerve deficit.     Motor: No weakness or abnormal muscle tone.     Coordination: Coordination normal.     Gait: Gait abnormal.     Deep Tendon Reflexes:  Reflexes are normal and symmetric.  Psychiatric:        Behavior: Behavior normal.        Thought Content: Thought content normal.        Judgment: Judgment normal.   LS w/pain Antalgic gait R>L leg edema SD on face   Lab Results  Component Value Date   WBC 10.4 01/07/2023   HGB 15.2 01/07/2023   HCT 43.9 01/07/2023   PLT 219 01/07/2023   GLUCOSE 112 (H) 01/07/2023   CHOL 191 03/17/2022   TRIG 168.0 (H) 03/17/2022   HDL 36.20 (L) 03/17/2022   LDLDIRECT 137.0 03/10/2017   LDLCALC 121 (H) 03/17/2022   ALT 23 01/02/2023   AST 25 01/02/2023   NA 135 01/07/2023   K 3.4 (L) 01/07/2023   CL 99 01/07/2023   CREATININE 1.13 01/07/2023   BUN 13 01/07/2023   CO2 25 01/07/2023   TSH 4.49 03/17/2022   PSA 0.09 (L) 03/30/2019   INR 1.2 01/06/2023   HGBA1C 5.8 11/12/2021    ECHOCARDIOGRAM COMPLETE  Result Date: 01/07/2023    ECHOCARDIOGRAM REPORT   Patient Name:   SHAYDEN BOBIER Date of Exam: 01/07/2023 Medical Rec #:  [id number]         Height:       71.0 in Accession #:    [id number]        Weight:       317.7 lb Date of Birth:  1951-02-19         BSA:          2.567 m Patient Age:    72 years          BP:           152/75 mmHg Patient Gender: M                 HR:           104 bpm. Exam Location:  Inpatient Procedure: 2D Echo, Color Doppler and Cardiac Doppler Indications:    S/P TAVR  History:        Patient has prior history of Echocardiogram examinations, most                 recent 01/06/2023. COPD, Aortic Valve Disease; Arrythmias:Atrial                  Fibrillation.                 Aortic Valve: 34 Medtronic CoreValve-Evolut Pro prosthetic,                 stented (TAVR) valve is present in the aortic position.                 Procedure Date: 01/06/23.  Sonographer:    Darlys Gales Referring Phys: [id number] KATHRYN R THOMPSON IMPRESSIONS  1. Abnormal septal motion Patient in afib during study. Left ventricular ejection fraction, by estimation, is 50 to 55%. The left ventricle has low normal function. The left ventricle has no regional wall motion abnormalities. Left ventricular diastolic  parameters are indeterminate.  2. Right ventricular systolic function is mildly reduced. The right ventricular size is mildly enlarged.  3. The mitral valve is abnormal. Trivial mitral valve regurgitation. No evidence of mitral stenosis.  4. 34 mm Evolute Pro valve placed somewhat deep. No PVL mean gradient 7 peak 11 mmHg AVA 1.5 cm2. The aortic valve has been repaired/replaced. Aortic valve regurgitation is not visualized. No aortic stenosis  is present. There is a 34 Medtronic CoreValve-Evolut Pro prosthetic (TAVR) valve present in the aortic position. Procedure Date: 01/06/23.  5. The inferior vena cava is normal in size with greater than 50% respiratory variability, suggesting right atrial pressure of 3 mmHg. FINDINGS  Left Ventricle: Abnormal septal motion Patient in afib during study. Left ventricular ejection fraction, by estimation, is 50 to 55%. The left ventricle has low normal function. The left ventricle has no regional wall motion abnormalities. The left ventricular internal cavity size was normal in size. There is no left ventricular hypertrophy. Left ventricular diastolic parameters are indeterminate. Right Ventricle: The right ventricular size is mildly enlarged. No increase in right ventricular wall thickness. Right ventricular systolic function is mildly reduced. Left Atrium: Left atrial size was normal in size. Right Atrium: Right atrial size was normal in  size. Pericardium: There is no evidence of pericardial effusion. Mitral Valve: The mitral valve is abnormal. There is mild thickening of the mitral valve leaflet(s). Trivial mitral valve regurgitation. No evidence of mitral valve stenosis. MV peak gradient, 8.0 mmHg. The mean mitral valve gradient is 4.0 mmHg. Tricuspid Valve: The tricuspid valve is normal in structure. Tricuspid valve regurgitation is not demonstrated. No evidence of tricuspid stenosis. Aortic Valve: 34 mm Evolute Pro valve placed somewhat deep. No PVL mean gradient 7 peak 11 mmHg AVA 1.5 cm2. The aortic valve has been repaired/replaced. Aortic valve regurgitation is not visualized. No aortic stenosis is present. Aortic valve mean gradient measures 10.2 mmHg. Aortic valve peak gradient measures 17.2 mmHg. Aortic valve area, by VTI measures 1.60 cm. There is a 34 Medtronic CoreValve-Evolut Pro prosthetic, stented (TAVR) valve present in the aortic position. Procedure Date: 01/06/23. Pulmonic Valve: The pulmonic valve was normal in structure. Pulmonic valve regurgitation is not visualized. No evidence of pulmonic stenosis. Aorta: The aortic root is normal in size and structure. Venous: The inferior vena cava is normal in size with greater than 50% respiratory variability, suggesting right atrial pressure of 3 mmHg. IAS/Shunts: The interatrial septum was not well visualized.  LEFT VENTRICLE PLAX 2D LVIDd:         5.40 cm   Diastology LVIDs:         4.10 cm   LV e' medial:   7.72 cm/s LV PW:         1.40 cm   LV E/e' medial: 18.9 LV IVS:        1.10 cm LVOT diam:     2.00 cm LV SV:         53 LV SV Index:   21 LVOT Area:     3.14 cm  RIGHT VENTRICLE             IVC RV S prime:     12.10 cm/s  IVC diam: 1.00 cm TAPSE (M-mode): 2.0 cm LEFT ATRIUM              Index        RIGHT ATRIUM           Index LA Vol (A2C):   78.5 ml  30.58 ml/m  RA Area:     26.60 cm LA Vol (A4C):   120.0 ml 46.75 ml/m  RA Volume:   85.40 ml  33.27 ml/m LA Biplane Vol: 101.0  ml 39.35 ml/m  AORTIC VALVE AV Area (Vmax):    1.67 cm AV Area (Vmean):   1.66 cm AV Area (VTI):     1.60 cm AV Vmax:  207.40 cm/s AV Vmean:          148.400 cm/s AV VTI:            0.331 m AV Peak Grad:      17.2 mmHg AV Mean Grad:      10.2 mmHg LVOT Vmax:         110.40 cm/s LVOT Vmean:        78.540 cm/s LVOT VTI:          0.168 m LVOT/AV VTI ratio: 0.51  AORTA Ao Asc diam: 3.60 cm MITRAL VALVE                TRICUSPID VALVE MV Area (PHT): 2.85 cm     TR Peak grad:   19.0 mmHg MV Area VTI:   2.39 cm     TR Vmax:        218.00 cm/s MV Peak grad:  8.0 mmHg MV Mean grad:  4.0 mmHg     SHUNTS MV Vmax:       1.41 m/s     Systemic VTI:  0.17 m MV Vmean:      99.0 cm/s    Systemic Diam: 2.00 cm MV Decel Time: 266 msec MV E velocity: 146.00 cm/s Charlton Haws MD Electronically signed by Charlton Haws MD Signature Date/Time: 01/07/2023/10:14:39 AM    Final    Structural Heart Procedure  Result Date: 01/06/2023 See surgical note for result.  ECHOCARDIOGRAM LIMITED  Result Date: 01/06/2023    ECHOCARDIOGRAM LIMITED REPORT   Patient Name:   COBURN KNAUS Date of Exam: 01/06/2023 Medical Rec #:  [id number]         Height:       71.0 in Accession #:    [id number]        Weight:       313.0 lb Date of Birth:  06-18-1951         BSA:          2.551 m Patient Age:    72 years          BP:           128/89 mmHg Patient Gender: M                 HR:           86 bpm. Exam Location:  Inpatient Procedure: Limited Echo, Cardiac Doppler and Color Doppler Indications:     I35.0 Nonrheumatic aortic (valve) stenosis  History:         Patient has prior history of Echocardiogram examinations, most                  recent 11/13/2022. Abnormal ECG, COPD, Aortic Valve Disease,                  Arrythmias:SVT and Atrial Fibrillation, Signs/Symptoms:Chest                  Pain; Risk Factors:Hypertension and Dyslipidemia. Severe aortic                  stenosis.                  Aortic Valve: 34 Medtronic Sapien prosthetic,  stented (TAVR)                  valve is present in the aortic position. Procedure Date:  01/06/2023.  Sonographer:     Sheralyn Boatman RDCS Sonographer#2:   Eulah Pont RDCS Referring Phys:  [id number] Wille Celeste THOMPSON Diagnosing Phys: Charlton Haws MD IMPRESSIONS  1. Inferior basal hypokinesis . Left ventricular ejection fraction, by estimation, is 50 to 55%. The left ventricle has low normal function.  2. The mitral valve is abnormal. No evidence of mitral valve regurgitation.  3. Pre TAVR: severe AS with calcified valve fused right/left cusps mean gradient 32 mmHg peak 60 mmHg AVA 1.1 cm2 normalized to BSA 0.43         Post TAVR: 34 mm Medtronic Evolut Positioned somewhat deep No PVL mean gradient 3 peak 6 mmHg with AVA 5.1 cm2. The aortic valve has been repaired/replaced. There is a 34 Medtronic Sapien prosthetic (TAVR) valve present in the aortic position. Procedure Date: 01/06/2023. FINDINGS  Left Ventricle: Inferior basal hypokinesis. Left ventricular ejection fraction, by estimation, is 50 to 55%. The left ventricle has low normal function. Mitral Valve: The mitral valve is abnormal. There is mild thickening of the mitral valve leaflet(s). There is mild calcification of the mitral valve leaflet(s). Aortic Valve: Pre TAVR: severe AS with calcified valve fused right/left cusps mean gradient 32 mmHg peak 60 mmHg AVA 1.1 cm2 normalized to BSA 0.43 Post TAVR: 34 mm Medtronic Evolut Positioned somewhat deep No PVL mean gradient 3 peak 6 mmHg with AVA 5.1 cm2. The aortic valve has been repaired/replaced. Aortic valve mean gradient measures 3.0 mmHg. Aortic valve peak gradient measures 5.4 mmHg. Aortic valve area, by VTI measures 6.10 cm. There is a 34 Medtronic Sapien prosthetic, stented (TAVR) valve present in the aortic position. Procedure Date: 01/06/2023. Aorta: The aortic root is normal in size and structure. Additional Comments: Spectral Doppler performed. Color Doppler performed.  LEFT VENTRICLE  PLAX 2D LVOT diam:     2.90 cm LV SV:         123 LV SV Index:   48 LVOT Area:     6.61 cm  LV Volumes (MOD) LV vol d, MOD A2C: 119.0 ml LV vol d, MOD A4C: 109.0 ml LV vol s, MOD A2C: 57.7 ml LV vol s, MOD A4C: 56.4 ml LV SV MOD A2C:     61.3 ml LV SV MOD A4C:     109.0 ml LV SV MOD BP:      59.9 ml AORTIC VALVE AV Area (Vmax):    5.51 cm AV Area (Vmean):   5.59 cm AV Area (VTI):     6.10 cm AV Vmax:           116.00 cm/s AV Vmean:          77.000 cm/s AV VTI:            0.201 m AV Peak Grad:      5.4 mmHg AV Mean Grad:      3.0 mmHg LVOT Vmax:         96.70 cm/s LVOT Vmean:        65.150 cm/s LVOT VTI:          0.186 m LVOT/AV VTI ratio: 0.92 TRICUSPID VALVE TR Peak grad:   19.7 mmHg TR Vmax:        222.00 cm/s  SHUNTS Systemic VTI:  0.19 m Systemic Diam: 2.90 cm Charlton Haws MD Electronically signed by Charlton Haws MD Signature Date/Time: 01/06/2023/9:42:55 AM    Final     Assessment & Plan:   Problem List Items Addressed This Visit  DVT of leg (deep venous thrombosis) (HCC)    Coumadin Compr sock R      B12 deficiency - Primary    On B12      Low back pain    Blue-Emu cream was recommended to use 2-3 times a day Using a Light Wave patch      S/P TAVR (transcatheter aortic valve replacement)    s/p TAVR  (01/06/23) - doing well      Atrial fib/flutter, transient (HCC)    Rate controlled Anticoagulated w/Coumadin         No orders of the defined types were placed in this encounter.     Follow-up: Return in about 3 months (around 04/17/2023).  Sonda Primes, MD

## 2023-01-15 NOTE — Assessment & Plan Note (Signed)
On B12 

## 2023-01-15 NOTE — Assessment & Plan Note (Signed)
Coumadin Compr sock R 

## 2023-01-15 NOTE — Assessment & Plan Note (Signed)
Blue-Emu cream was recommended to use 2-3 times a day Using a Light Wave patch 

## 2023-01-15 NOTE — Assessment & Plan Note (Signed)
s/p TAVR  (01/06/23) - doing well

## 2023-01-16 ENCOUNTER — Ambulatory Visit: Payer: MEDICARE

## 2023-01-16 ENCOUNTER — Ambulatory Visit (INDEPENDENT_AMBULATORY_CARE_PROVIDER_SITE_OTHER): Payer: MEDICARE

## 2023-01-16 DIAGNOSIS — Z7901 Long term (current) use of anticoagulants: Secondary | ICD-10-CM | POA: Diagnosis not present

## 2023-01-16 LAB — POCT INR: INR: 1.6 — AB (ref 2.0–3.0)

## 2023-01-16 NOTE — Patient Instructions (Addendum)
Pre visit review using our clinic review tool, if applicable. No additional management support is needed unless otherwise documented below in the visit note.  Increase dose today to take 1 1/2 tablet and the continue 1/2 tablet daily except take 1 tablet on Fridays.  Recheck in 2 weeks.

## 2023-01-16 NOTE — Progress Notes (Signed)
Pt had S/P TAVR on 7/16 and warfarin was managed by cardiology and held. Pt restarted warfarin on 7/17. Increase dose today to take 1 1/2 tablet and the continue 1/2 tablet daily except take 1 tablet on Fridays.  Recheck in 2 weeks.

## 2023-01-19 ENCOUNTER — Ambulatory Visit (HOSPITAL_COMMUNITY)
Admission: RE | Admit: 2023-01-19 | Discharge: 2023-01-19 | Disposition: A | Discharge status: Home / Self Care | Payer: Medicare Other | Source: Ambulatory Visit | Attending: Physician Assistant | Admitting: Physician Assistant

## 2023-01-19 DIAGNOSIS — N2889 Other specified disorders of kidney and ureter: Secondary | ICD-10-CM | POA: Insufficient documentation

## 2023-01-19 DIAGNOSIS — N281 Cyst of kidney, acquired: Secondary | ICD-10-CM | POA: Diagnosis not present

## 2023-01-19 DIAGNOSIS — N289 Disorder of kidney and ureter, unspecified: Secondary | ICD-10-CM | POA: Diagnosis not present

## 2023-01-19 DIAGNOSIS — Z9049 Acquired absence of other specified parts of digestive tract: Secondary | ICD-10-CM | POA: Diagnosis not present

## 2023-01-19 MED ORDER — GADOBUTROL 1 MMOL/ML IV SOLN
10.0000 mL | Freq: Once | INTRAVENOUS | Status: AC | PRN
  Administered 2023-01-19: 10 mL via INTRAVENOUS

## 2023-01-20 ENCOUNTER — Ambulatory Visit: Payer: MEDICARE

## 2023-01-21 ENCOUNTER — Telehealth (HOSPITAL_COMMUNITY): Payer: Self-pay

## 2023-01-21 ENCOUNTER — Ambulatory Visit (HOSPITAL_BASED_OUTPATIENT_CLINIC_OR_DEPARTMENT_OTHER): Payer: MEDICARE | Admitting: Cardiology

## 2023-01-22 ENCOUNTER — Encounter (HOSPITAL_COMMUNITY)
Admission: RE | Admit: 2023-01-22 | Discharge: 2023-01-22 | Disposition: A | Discharge status: Home / Self Care | Payer: MEDICARE | Source: Ambulatory Visit | Attending: Cardiology | Admitting: Cardiology

## 2023-01-22 VITALS — BP 112/74 | HR 83 | Ht 68.75 in | Wt 310.8 lb

## 2023-01-22 DIAGNOSIS — Z952 Presence of prosthetic heart valve: Secondary | ICD-10-CM | POA: Insufficient documentation

## 2023-01-22 NOTE — Progress Notes (Signed)
Cardiac Individual Treatment Plan  Patient Details  Name: Robert [name] MRN: [id number] Date of Birth: 1951-02-15 Referring Provider:   Flowsheet Row INTENSIVE CARDIAC REHAB ORIENT from 01/22/2023 in [facility]  Referring Provider Jodelle Red, MD       Initial Encounter Date:  Flowsheet Row INTENSIVE CARDIAC REHAB ORIENT from 01/22/2023 in [facility]  Date 01/22/23       Visit Diagnosis: 01/06/23 S/P TAVR (transcatheter aortic valve replacement)  Patient's Home Medications on Admission:  Current Outpatient Medications:    albuterol (VENTOLIN HFA) 108 (90 Base) MCG/ACT inhaler, Inhale 2 puffs into the lungs every 6 (six) hours as needed for wheezing or shortness of breath., Disp: 6.7 g, Rfl: 0   allopurinol (ZYLOPRIM) 100 MG tablet, Take 1 tablet (100 mg total) by mouth daily., Disp: 90 tablet, Rfl: 1   celecoxib (CELEBREX) 200 MG capsule, Take 1 capsule (200 mg total) by mouth 2 (two) times daily. (Patient taking differently: Take 200 mg by mouth 2 (two) times daily as needed for moderate pain.), Disp: 14 capsule, Rfl: 0   cetirizine (ZYRTEC) 10 MG tablet, Take 1 tablet by mouth once daily (Patient taking differently: Take 10 mg by mouth at bedtime.), Disp: 30 tablet, Rfl: 5   Cholecalciferol (VITAMIN D3) 125 MCG (5000 UT) CAPS, Take 1 capsule (5,000 Units total) by mouth daily., Disp: 100 capsule, Rfl: 3   colchicine 0.6 MG tablet, Take two tablets prn gout attack.Then take another one in 1-2 hrs. Do not repeat for 3 days., Disp: 18 tablet, Rfl: 1   doxycycline (MONODOX) 100 MG capsule, Take 1 capsule (100 mg total) by mouth as directed. 1 hour before any dental work, including cleanings., Disp: 6 capsule, Rfl: 6   famotidine (PEPCID) 40 MG tablet, Take 1 tablet by mouth once daily (Patient taking differently: Take 40 mg by mouth daily as needed for heartburn  or indigestion.), Disp: 30 tablet, Rfl: 5   faricimab-svoa (VABYSMO) 6 MG/0.05ML SOLN intravitreal injection, 6 mg by Intravitreal route once. Injections every 8 weeks, Disp: , Rfl:    fish oil-omega-3 fatty acids 1000 MG capsule, Take 1 g by mouth every morning., Disp: , Rfl:    furosemide (LASIX) 20 MG tablet, TAKE LASIX 40 MG TWICE DAILY FOR THREE DAYS, THEN DECREASE TO 40 MG IN THE AM AND 20 MG IN THE PM BY MOUTH, Disp: 270 tablet, Rfl: 3   glucosamine-chondroitin 500-400 MG tablet, Take 1 tablet by mouth every morning., Disp: , Rfl:    mupirocin ointment (BACTROBAN) 2 %, Place 1 Application into the nose 2 (two) times daily., Disp: , Rfl:    nortriptyline (PAMELOR) 25 MG capsule, TAKE 1 CAPSULE AT BEDTIME, Disp: 90 capsule, Rfl: 3   potassium chloride (KLOR-CON) 10 MEQ tablet, START POTASSIUM 10 MG TWICE DAILY FOR 3 DAYS, THEN DECREASE TO 10 MG DAILY., Disp: 90 tablet, Rfl: 3   predniSONE (DELTASONE) 5 MG tablet, TAKE 1 TABLET DAILY WITH BREAKFAST, Disp: 90 tablet, Rfl: 3   pregabalin (LYRICA) 75 MG capsule, Take 1 capsule (75 mg total) by mouth 3 (three) times daily as needed., Disp: 90 capsule, Rfl: 3   triamcinolone cream (KENALOG) 0.1 %, Apply 1 Application topically 3 (three) times daily. (Patient taking differently: Apply 1 Application topically daily as needed (irritation).), Disp: 450 g, Rfl: 1   warfarin (COUMADIN) 3 MG tablet, TAKE 1 TABLET DAILY OR TAKE  AS DIRECTED BY ANTICOAGULATION CLINIC (Patient taking differently: Take 3 mg by mouth See admin instructions. TAKE 1 TABLET DAILY OR TAKE AS DIRECTED BY ANTICOAGULATION CLINIC; 6 mg (6 mg x 1) every Fri; 3 mg (6 mg x 0.5) all other days), Disp: 90 tablet, Rfl: 1   warfarin (COUMADIN) 6 MG tablet, TAKE 1/2 TABLET BY MOUTH DAILY EXCEPT TAKE 1 TABLET ON FRIDAYS OR AS DIRECTED BY ANTICOAGULATION CLINIC (Patient taking differently: Take 6 mg by mouth See admin instructions. TAKE 1/2 TABLET BY MOUTH DAILY EXCEPT TAKE 1 TABLET ON FRIDAYS OR AS  DIRECTED BY ANTICOAGULATION CLINIC; 6 mg (6 mg x 1) every Fri; 3 mg (6 mg x 0.5) all other days), Disp: 60 tablet, Rfl: 1  Past Medical History: Past Medical History:  Diagnosis Date   Anxiety    Asthma    "no attack since acupuncture in 1990's"   Atrial fibrillation (HCC)    Complication of anesthesia    SLOW TO WAKE UP / DIFFICULTY RESPONDING 2003, 3 days before could use left arm, "wild/crazy sometimes"   COPD (chronic obstructive pulmonary disease) (HCC)    Cyst of left kidney    "benign"   DVT (deep venous thrombosis) (HCC) 06/23/2001   right femoral artery "from groin to knee"   Edema    Right Leg w/ h/o DVT postphlebitic   H/O blood transfusion reaction 06/23/2002   FFP, extreme swelling, could not breath   History of pulmonary embolism 06/23/2001   both lungs   Hyperlipidemia    Knee pain    left   LBP (low back pain)    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 Lynnette Caffey and Dr. Leafy Ro   Severe aortic stenosis    Spondylolisthesis    Warfarin anticoagulation    Wheezing    when laying on left side    Tobacco Use: Social History   Tobacco Use  Smoking Status Never  Smokeless Tobacco Never    Labs: Review Flowsheet  More data exists      Latest Ref Rng & Units 04/04/2021 11/12/2021 03/17/2022 12/16/2022 01/06/2023  Labs for ITP Cardiac and Pulmonary Rehab  Cholestrol 0 - 200 mg/dL - - 161  - -  LDL (calc) 0 - 99 mg/dL - - 096  - -  HDL-C >04.54 mg/dL - - 09.81  - -  Trlycerides 0.0 - 149.0 mg/dL - - 191.4  - -  Hemoglobin A1c 4.6 - 6.5 % 6.1  5.8  - - -  PH, Arterial 7.35 - 7.45 - - - 7.376  -  PCO2 arterial 32 - 48 mmHg - - - 39.5  -  Bicarbonate 20.0 - 28.0 mmol/L - - - 25.8  25.2  23.1  -  TCO2 22 - 32 mmol/L - - - 27   27  24   32   Acid-base deficit 0.0 - 2.0 mmol/L - - - 1.0  2.0  -  O2 Saturation % - - - 74  76  96  -    Details       Multiple values from one day are sorted in reverse-chronological order         Capillary Blood Glucose: Lab  Results  Component Value Date   GLUCAP 231 (H) 04/05/2013   GLUCAP 132 (H) 04/05/2013   GLUCAP 110 (H) 05/21/2010     Exercise Target Goals: Exercise Program Goal: Individual exercise prescription set using results from initial 6 min walk test and THRR while considering  patient's activity barriers and safety.   Exercise Prescription Goal: Initial exercise prescription builds to 30-45 minutes a day of aerobic activity, 2-3 days per week.  Home exercise guidelines will be given to patient during program as part of exercise prescription that the participant will acknowledge.  Activity Barriers & Risk Stratification:  Activity Barriers & Cardiac Risk Stratification - 01/22/23 1231       Activity Barriers & Cardiac Risk Stratification   Activity Barriers Arthritis;Back Problems;Joint Problems;Deconditioning;Muscular Weakness;Shortness of Breath;Balance Concerns    Cardiac Risk Stratification High             6 Minute Walk:  6 Minute Walk     Row Name 01/22/23 1126         6 Minute Walk   Phase Initial     Distance 480 feet     Walk Time 6 minutes     # of Rest Breaks 2  2 breaks totaling 2:07 due to going over [facility]     MPH 1     METS 1.7     RPE 9     Perceived Dyspnea  1     VO2 Peak 1.03     Symptoms Yes (comment)     Comments SOB, RPD = 1     Resting HR 81 bpm     Resting BP 112/74     Resting Oxygen Saturation  95 %     Exercise Oxygen Saturation  during 6 min walk 94 %     Max Ex. HR 134 bpm     Max Ex. BP 144/78     2 Minute Post BP 124/80       Interval HR   1 Minute HR 114     2 Minute HR 129     3 Minute HR 114     4 Minute HR 112     5 Minute HR 126     6 Minute HR 114     2 Minute Post HR 87     Interval  Heart Rate? Yes       Interval Oxygen   Interval Oxygen? Yes     Baseline Oxygen Saturation % 95 %     1 Minute Oxygen Saturation % 97 %     1 Minute Liters of Oxygen 0 L     2 Minute Oxygen Saturation % 94 %     2 Minute Liters of Oxygen 0 L     3 Minute Oxygen Saturation % 96 %     3 Minute Liters of Oxygen 0 L     4 Minute Oxygen Saturation % 96 %     4 Minute Liters of Oxygen 0 L     5 Minute Oxygen Saturation % 95 %     5 Minute Liters of Oxygen 0 L     6 Minute Oxygen Saturation % 97 %     6 Minute Liters of Oxygen 0 L              Oxygen Initial Assessment:   Oxygen Re-Evaluation:   Oxygen Discharge (Final Oxygen Re-Evaluation):   Initial Exercise Prescription:  Initial Exercise Prescription - 01/22/23 1200  Date of Initial Exercise RX and Referring Provider   Date 01/22/23    Referring Provider Jodelle Red, MD    Expected Discharge Date 04/15/23      T5 Nustep   Level 1    SPM 70    Minutes 25    METs 1.7      Prescription Details   Frequency (times per week) 3    Duration Progress to 30 minutes of continuous aerobic without signs/symptoms of physical distress      Intensity   THRR 40-80% of Max Heartrate 60-119    Ratings of Perceived Exertion 11-13    Perceived Dyspnea 0-4      Progression   Progression Continue progressive overload as per policy without signs/symptoms or physical distress.      Resistance Training   Training Prescription Yes    Weight 3 lbs    Reps 10-15             Perform Capillary Blood Glucose checks as needed.  Exercise Prescription Changes:   Exercise Comments:   Exercise Goals and Review:   Exercise Goals     Row Name 01/22/23 1233             Exercise Goals   Increase Physical Activity Yes       Intervention Provide advice, education, support and counseling about physical activity/exercise needs.;Develop an individualized exercise prescription for aerobic and resistive  training based on initial evaluation findings, risk stratification, comorbidities and participant's personal goals.       Expected Outcomes Short Term: Attend rehab on a regular basis to increase amount of physical activity.;Long Term: Add in home exercise to make exercise part of routine and to increase amount of physical activity.;Long Term: Exercising regularly at least 3-5 days a week.       Increase Strength and Stamina Yes       Intervention Provide advice, education, support and counseling about physical activity/exercise needs.;Develop an individualized exercise prescription for aerobic and resistive training based on initial evaluation findings, risk stratification, comorbidities and participant's personal goals.       Expected Outcomes Short Term: Increase workloads from initial exercise prescription for resistance, speed, and METs.;Short Term: Perform resistance training exercises routinely during rehab and add in resistance training at home;Long Term: Improve cardiorespiratory fitness, muscular endurance and strength as measured by increased METs and functional capacity ( )       Able to understand and use rate of perceived exertion (RPE) scale Yes       Intervention Provide education and explanation on how to use RPE scale       Expected Outcomes Short Term: Able to use RPE daily in rehab to express subjective intensity level;Long Term:  Able to use RPE to guide intensity level when exercising independently       Knowledge and understanding of Target Heart Rate Range (THRR) Yes       Intervention Provide education and explanation of THRR including how the numbers were predicted and where they are located for reference       Expected Outcomes Short Term: Able to state/look up THRR;Short Term: Able to use daily as guideline for intensity in rehab;Long Term: Able to use THRR to govern intensity when exercising independently       Understanding of Exercise Prescription Yes       Intervention  Provide education, explanation, and written materials on patient's individual exercise prescription       Expected Outcomes Short Term: Able to  explain program exercise prescription;Long Term: Able to explain home exercise prescription to exercise independently                Exercise Goals Re-Evaluation :   Discharge Exercise Prescription (Final Exercise Prescription Changes):   Nutrition:  Target Goals: Understanding of nutrition guidelines, daily intake of sodium 1500mg , cholesterol 200mg , calories 30% from fat and 7% or less from saturated fats, daily to have 5 or more servings of fruits and vegetables.  Biometrics:  Pre Biometrics - 01/22/23 1000       Pre Biometrics   Waist Circumference 55.25 inches    Hip Circumference 52 inches    Waist to Hip Ratio 1.06 %    Triceps Skinfold 34 mm    % Body Fat 45.3 %    Grip Strength 34 kg    Flexibility --   Not performed due to significant back issues   Single Leg Stand 1.18 seconds              Nutrition Therapy Plan and Nutrition Goals:   Nutrition Assessments:  MEDIFICTS Score Key: ?70 Need to make dietary changes  40-70 Heart Healthy Diet ? 40 Therapeutic Level Cholesterol Diet    Picture Your Plate Scores: <96 Unhealthy dietary pattern with much room for improvement. 41-50 Dietary pattern unlikely to meet recommendations for good health and room for improvement. 51-60 More healthful dietary pattern, with some room for improvement.  >60 Healthy dietary pattern, although there may be some specific behaviors that could be improved.    Nutrition Goals Re-Evaluation:   Nutrition Goals Re-Evaluation:   Nutrition Goals Discharge (Final Nutrition Goals Re-Evaluation):   Psychosocial: Target Goals: Acknowledge presence or absence of significant depression and/or stress, maximize coping skills, provide positive support system. Participant is able to verbalize types and ability to use techniques and skills  needed for reducing stress and depression.  Initial Review & Psychosocial Screening:  Initial Psych Review & Screening - 01/22/23 1205       Initial Review   Current issues with None Identified      Family Dynamics   Good Support System? Yes   Has spouse and sons, family for support     Barriers   Psychosocial barriers to participate in program The patient should benefit from training in stress management and relaxation.      Screening Interventions   Interventions Encouraged to exercise             Quality of Life Scores:  Quality of Life - 01/22/23 1237       Quality of Life   Select Quality of Life      Quality of Life Scores   Health/Function Pre 11.17 %    Socioeconomic Pre 23 %    Psych/Spiritual Pre 21.57 %    Family Pre 21.6 %    GLOBAL Pre 17.11 %            Scores of 19 and below usually indicate a poorer quality of life in these areas.  A difference of  2-3 points is a clinically meaningful difference.  A difference of 2-3 points in the total score of the Quality of Life Index has been associated with significant improvement in overall quality of life, self-image, physical symptoms, and general health in studies assessing change in quality of life.  PHQ-9: Review Flowsheet  More data exists      01/22/2023 10/16/2022 01/20/2022 08/08/2021 05/31/2020  Depression screen PHQ 2/9  Decreased Interest 0 0  0 0 0  Down, Depressed, Hopeless 0 0 0 0 0  PHQ - 2 Score 0 0 0 0 0  Altered sleeping 0 - - - -  Tired, decreased energy 3 - - - -  Change in appetite 0 - - - -  Feeling bad or failure about yourself  1 - - - -  Trouble concentrating 1 - - - -  Moving slowly or fidgety/restless 0 - - - -  Suicidal thoughts 0 - - - -  PHQ-9 Score 5 - - - -  Difficult doing work/chores Extremely dIfficult - - - -    Details           Interpretation of Total Score  Total Score Depression Severity:  1-4 = Minimal depression, 5-9 = Mild depression, 10-14 = Moderate  depression, 15-19 = Moderately severe depression, 20-27 = Severe depression   Psychosocial Evaluation and Intervention:   Psychosocial Re-Evaluation:   Psychosocial Discharge (Final Psychosocial Re-Evaluation):   Vocational Rehabilitation: Provide vocational rehab assistance to qualifying candidates.   Vocational Rehab Evaluation & Intervention:  Vocational Rehab - 01/22/23 1209       Initial Vocational Rehab Evaluation & Intervention   Assessment shows need for Vocational Rehabilitation No   Pt is retired            Education: Education Goals: Education classes will be provided on a weekly basis, covering required topics. Participant will state understanding/return demonstration of topics presented.     Core Videos: Exercise    Move It!  Clinical staff conducted group or individual video education with verbal and written material and guidebook.  Patient learns the recommended Pritikin exercise program. Exercise with the goal of living a long, healthy life. Some of the health benefits of exercise include controlled diabetes, healthier blood pressure levels, improved cholesterol levels, improved heart and lung capacity, improved sleep, and better body composition. Everyone should speak with their doctor before starting or changing an exercise routine.  Biomechanical Limitations Clinical staff conducted group or individual video education with verbal and written material and guidebook.  Patient learns how biomechanical limitations can impact exercise and how we can mitigate and possibly overcome limitations to have an impactful and balanced exercise routine.  Body Composition Clinical staff conducted group or individual video education with verbal and written material and guidebook.  Patient learns that body composition (ratio of muscle mass to fat mass) is a key component to assessing overall fitness, rather than body weight alone. Increased fat mass, especially visceral  belly fat, can put [place] at increased risk for metabolic syndrome, type 2 diabetes, heart disease, and even death. It is recommended to combine diet and exercise (cardiovascular and resistance training) to improve your body composition. Seek guidance from your physician and exercise physiologist before implementing an exercise routine.  Exercise Action Plan Clinical staff conducted group or individual video education with verbal and written material and guidebook.  Patient learns the recommended strategies to achieve and enjoy long-term exercise adherence, including variety, self-motivation, self-efficacy, and positive decision making. Benefits of exercise include fitness, good health, weight management, more energy, better sleep, less stress, and overall well-being.  Medical   Heart Disease Risk Reduction Clinical staff conducted group or individual video education with verbal and written material and guidebook.  Patient learns our heart is our most vital organ as it circulates oxygen, nutrients, white blood cells, and hormones throughout the entire body, and carries waste away. Data supports a plant-based eating plan like the Pritikin  Program for its effectiveness in slowing progression of and reversing heart disease. The video provides a number of recommendations to address heart disease.   Metabolic Syndrome and Belly Fat  Clinical staff conducted group or individual video education with verbal and written material and guidebook.  Patient learns what metabolic syndrome is, how it leads to heart disease, and how one can reverse it and keep it from coming back. You have metabolic syndrome if you have 3 of the following 5 criteria: abdominal obesity, high blood pressure, high triglycerides, low HDL cholesterol, and high blood sugar.  Hypertension and Heart Disease Clinical staff conducted group or individual video education with verbal and written material and guidebook.  Patient learns that high  blood pressure, or hypertension, is very common in the [place]. Hypertension is largely due to excessive salt intake, but other important risk factors include being overweight, physical inactivity, drinking too much alcohol, smoking, and not eating enough potassium from fruits and vegetables. High blood pressure is a leading risk factor for heart attack, stroke, congestive heart failure, dementia, kidney failure, and premature death. Long-term effects of excessive salt intake include stiffening of the arteries and thickening of heart muscle and organ damage. Recommendations include ways to reduce hypertension and the risk of heart disease.  Diseases of Our Time - Focusing on Diabetes Clinical staff conducted group or individual video education with verbal and written material and guidebook.  Patient learns why the best way to stop diseases of our time is prevention, through food and other lifestyle changes. Medicine (such as prescription pills and surgeries) is often only a Band-Aid on the problem, not a long-term solution. Most common diseases of our time include obesity, type 2 diabetes, hypertension, heart disease, and cancer. The Pritikin Program is recommended and has been proven to help reduce, reverse, and/or prevent the damaging effects of metabolic syndrome.  Nutrition   Overview of the Pritikin Eating Plan  Clinical staff conducted group or individual video education with verbal and written material and guidebook.  Patient learns about the Pritikin Eating Plan for disease risk reduction. The Pritikin Eating Plan emphasizes a wide variety of unrefined, minimally-processed carbohydrates, like fruits, vegetables, whole grains, and legumes. Go, Caution, and Stop food choices are explained. Plant-based and lean animal proteins are emphasized. Rationale provided for low sodium intake for blood pressure control, low added sugars for blood sugar stabilization, and low added fats and oils for  coronary artery disease risk reduction and weight management.  Calorie Density  Clinical staff conducted group or individual video education with verbal and written material and guidebook.  Patient learns about calorie density and how it impacts the Pritikin Eating Plan. Knowing the characteristics of the food you choose will help you decide whether those foods will lead to weight gain or weight loss, and whether you want to consume more or less of them. Weight loss is usually a side effect of the Pritikin Eating Plan because of its focus on low calorie-dense foods.  Label Reading  Clinical staff conducted group or individual video education with verbal and written material and guidebook.  Patient learns about the Pritikin recommended label reading guidelines and corresponding recommendations regarding calorie density, added sugars, sodium content, and whole grains.  Dining Out - Part 1  Clinical staff conducted group or individual video education with verbal and written material and guidebook.  Patient learns that restaurant meals can be sabotaging because they can be so high in calories, fat, sodium, and/or sugar. Patient learns recommended  strategies on how to positively address this and avoid unhealthy pitfalls.  Facts on Fats  Clinical staff conducted group or individual video education with verbal and written material and guidebook.  Patient learns that lifestyle modifications can be just as effective, if not more so, as many medications for lowering your risk of heart disease. A Pritikin lifestyle can help to reduce your risk of inflammation and atherosclerosis (cholesterol build-up, or plaque, in the artery walls). Lifestyle interventions such as dietary choices and physical activity address the cause of atherosclerosis. A review of the types of fats and their impact on blood cholesterol levels, along with dietary recommendations to reduce fat intake is also included.  Nutrition Action Plan   Clinical staff conducted group or individual video education with verbal and written material and guidebook.  Patient learns how to incorporate Pritikin recommendations into their lifestyle. Recommendations include planning and keeping personal health goals in mind as an important part of their success.  Healthy Mind-Set    Healthy Minds, Bodies, Hearts  Clinical staff conducted group or individual video education with verbal and written material and guidebook.  Patient learns how to identify when they are stressed. Video will discuss the impact of that stress, as well as the many benefits of stress management. Patient will also be introduced to stress management techniques. The way we think, act, and feel has an impact on our hearts.  How Our Thoughts Can Heal Our Hearts  Clinical staff conducted group or individual video education with verbal and written material and guidebook.  Patient learns that negative thoughts can cause depression and anxiety. This can result in negative lifestyle behavior and serious health problems. Cognitive behavioral therapy is an effective method to help control our thoughts in order to change and improve our emotional outlook.  Additional Videos:  Exercise    Improving Performance  Clinical staff conducted group or individual video education with verbal and written material and guidebook.  Patient learns to use a non-linear approach by alternating intensity levels and lengths of time spent exercising to help burn more calories and lose more body fat. Cardiovascular exercise helps improve heart health, metabolism, hormonal balance, blood sugar control, and recovery from fatigue. Resistance training improves strength, endurance, balance, coordination, reaction time, metabolism, and muscle mass. Flexibility exercise improves circulation, posture, and balance. Seek guidance from your physician and exercise physiologist before implementing an exercise routine and learn  your capabilities and proper form for all exercise.  Introduction to Yoga  Clinical staff conducted group or individual video education with verbal and written material and guidebook.  Patient learns about yoga, a discipline of the coming together of mind, breath, and body. The benefits of yoga include improved flexibility, improved range of motion, better posture and core strength, increased lung function, weight loss, and positive self-image. Yoga's heart health benefits include lowered blood pressure, healthier heart rate, decreased cholesterol and triglyceride levels, improved immune function, and reduced stress. Seek guidance from your physician and exercise physiologist before implementing an exercise routine and learn your capabilities and proper form for all exercise.  Medical   Aging: Enhancing Your Quality of Life  Clinical staff conducted group or individual video education with verbal and written material and guidebook.  Patient learns key strategies and recommendations to stay in good physical health and enhance quality of life, such as prevention strategies, having an advocate, securing a Health Care Proxy and Power of Attorney, and keeping a list of medications and system for tracking them. It also discusses  how to avoid risk for bone loss.  Biology of Weight Control  Clinical staff conducted group or individual video education with verbal and written material and guidebook.  Patient learns that weight gain occurs because we consume more calories than we burn (eating more, moving less). Even if your body weight is normal, you may have higher ratios of fat compared to muscle mass. Too much body fat puts you at increased risk for cardiovascular disease, heart attack, stroke, type 2 diabetes, and obesity-related cancers. In addition to exercise, following the Pritikin Eating Plan can help reduce your risk.  Decoding Lab Results  Clinical staff conducted group or individual video education  with verbal and written material and guidebook.  Patient learns that lab test reflects one measurement whose values change over time and are influenced by many factors, including medication, stress, sleep, exercise, food, hydration, pre-existing medical conditions, and more. It is recommended to use the knowledge from this video to become more involved with your lab results and evaluate your numbers to speak with your doctor.   Diseases of Our Time - Overview  Clinical staff conducted group or individual video education with verbal and written material and guidebook.  Patient learns that according to the CDC, 50% to 70% of chronic diseases (such as obesity, type 2 diabetes, elevated lipids, hypertension, and heart disease) are avoidable through lifestyle improvements including healthier food choices, listening to satiety cues, and increased physical activity.  Sleep Disorders Clinical staff conducted group or individual video education with verbal and written material and guidebook.  Patient learns how good quality and duration of sleep are important to overall health and well-being. Patient also learns about sleep disorders and how they impact health along with recommendations to address them, including discussing with a physician.  Nutrition  Dining Out - Part 2 Clinical staff conducted group or individual video education with verbal and written material and guidebook.  Patient learns how to plan ahead and communicate in order to maximize their dining experience in a healthy and nutritious manner. Included are recommended food choices based on the type of restaurant the patient is visiting.   Fueling a Banker conducted group or individual video education with verbal and written material and guidebook.  There is a strong connection between our food choices and our health. Diseases like obesity and type 2 diabetes are very prevalent and are in large-part due to lifestyle  choices. The Pritikin Eating Plan provides plenty of food and hunger-curbing satisfaction. It is easy to follow, affordable, and helps reduce health risks.  Menu Workshop  Clinical staff conducted group or individual video education with verbal and written material and guidebook.  Patient learns that restaurant meals can sabotage health goals because they are often packed with calories, fat, sodium, and sugar. Recommendations include strategies to plan ahead and to communicate with the manager, chef, or server to help order a healthier meal.  Planning Your Eating Strategy  Clinical staff conducted group or individual video education with verbal and written material and guidebook.  Patient learns about the Pritikin Eating Plan and its benefit of reducing the risk of disease. The Pritikin Eating Plan does not focus on calories. Instead, it emphasizes high-quality, nutrient-rich foods. By knowing the characteristics of the foods, we choose, we can determine their calorie density and make informed decisions.  Targeting Your Nutrition Priorities  Clinical staff conducted group or individual video education with verbal and written material and guidebook.  Patient learns that lifestyle habits  have a tremendous impact on disease risk and progression. This video provides eating and physical activity recommendations based on your personal health goals, such as reducing LDL cholesterol, losing weight, preventing or controlling type 2 diabetes, and reducing high blood pressure.  Vitamins and Minerals  Clinical staff conducted group or individual video education with verbal and written material and guidebook.  Patient learns different ways to obtain key vitamins and minerals, including through a recommended healthy diet. It is important to discuss all supplements you take with your doctor.   Healthy Mind-Set    Smoking Cessation  Clinical staff conducted group or individual video education with verbal and  written material and guidebook.  Patient learns that cigarette smoking and tobacco addiction pose a serious health risk which affects millions of people. Stopping smoking will significantly reduce the risk of heart disease, lung disease, and many forms of cancer. Recommended strategies for quitting are covered, including working with your doctor to develop a successful plan.  Culinary   Becoming a Set designer conducted group or individual video education with verbal and written material and guidebook.  Patient learns that cooking at home can be healthy, cost-effective, quick, and puts them in control. Keys to cooking healthy recipes will include looking at your recipe, assessing your equipment needs, planning ahead, making it simple, choosing cost-effective seasonal ingredients, and limiting the use of added fats, salts, and sugars.  Cooking - Breakfast and Snacks  Clinical staff conducted group or individual video education with verbal and written material and guidebook.  Patient learns how important breakfast is to satiety and nutrition through the entire day. Recommendations include key foods to eat during breakfast to help stabilize blood sugar levels and to prevent overeating at meals later in the day. Planning ahead is also a key component.  Cooking - Educational psychologist conducted group or individual video education with verbal and written material and guidebook.  Patient learns eating strategies to improve overall health, including an approach to cook more at home. Recommendations include thinking of animal protein as a side on your plate rather than center stage and focusing instead on lower calorie dense options like vegetables, fruits, whole grains, and plant-based proteins, such as beans. Making sauces in large quantities to freeze for later and leaving the skin on your vegetables are also recommended to maximize your experience.  Cooking - Healthy Salads and  Dressing Clinical staff conducted group or individual video education with verbal and written material and guidebook.  Patient learns that vegetables, fruits, whole grains, and legumes are the foundations of the Pritikin Eating Plan. Recommendations include how to incorporate each of these in flavorful and healthy salads, and how to create homemade salad dressings. Proper handling of ingredients is also covered. Cooking - Soups and [employer] - Soups and Desserts Clinical staff conducted group or individual video education with verbal and written material and guidebook.  Patient learns that Pritikin soups and desserts make for easy, nutritious, and delicious snacks and meal components that are low in sodium, fat, sugar, and calorie density, while high in vitamins, minerals, and filling fiber. Recommendations include simple and healthy ideas for soups and desserts.   Overview     The Pritikin Solution Program Overview Clinical staff conducted group or individual video education with verbal and written material and guidebook.  Patient learns that the results of the Pritikin Program have been documented in more than 100 articles published in peer-reviewed journals, and the benefits  include reducing risk factors for (and, in some cases, even reversing) high cholesterol, high blood pressure, type 2 diabetes, obesity, and more! An overview of the three key pillars of the Pritikin Program will be covered: eating well, doing regular exercise, and having a healthy mind-set.  WORKSHOPS  Exercise: Exercise Basics: Building Your Action Plan Clinical staff led group instruction and group discussion with PowerPoint presentation and patient guidebook. To enhance the learning environment the use of posters, models and videos may be added. At the conclusion of this workshop, patients will comprehend the difference between physical activity and exercise, as well as the benefits of incorporating both, into  their routine. Patients will understand the FITT (Frequency, Intensity, Time, and Type) principle and how to use it to build an exercise action plan. In addition, safety concerns and other considerations for exercise and cardiac rehab will be addressed by the presenter. The purpose of this lesson is to promote a comprehensive and effective weekly exercise routine in order to improve patients' overall level of fitness.   Managing Heart Disease: Your Path to a Healthier Heart Clinical staff led group instruction and group discussion with PowerPoint presentation and patient guidebook. To enhance the learning environment the use of posters, models and videos may be added.At the conclusion of this workshop, patients will understand the anatomy and physiology of the heart. Additionally, they will understand how Pritikin's three pillars impact the risk factors, the progression, and the management of heart disease.  The purpose of this lesson is to provide a high-level overview of the heart, heart disease, and how the Pritikin lifestyle positively impacts risk factors.  Exercise Biomechanics Clinical staff led group instruction and group discussion with PowerPoint presentation and patient guidebook. To enhance the learning environment the use of posters, models and videos may be added. Patients will learn how the structural parts of their bodies function and how these functions impact their daily activities, movement, and exercise. Patients will learn how to promote a neutral spine, learn how to manage pain, and identify ways to improve their physical movement in order to promote healthy living. The purpose of this lesson is to expose patients to common physical limitations that impact physical activity. Participants will learn practical ways to adapt and manage aches and pains, and to minimize their effect on regular exercise. Patients will learn how to maintain good posture while sitting, walking, and  lifting.  Balance Training and Fall Prevention  Clinical staff led group instruction and group discussion with PowerPoint presentation and patient guidebook. To enhance the learning environment the use of posters, models and videos may be added. At the conclusion of this workshop, patients will understand the importance of their sensorimotor skills (vision, proprioception, and the vestibular system) in maintaining their ability to balance as they age. Patients will apply a variety of balancing exercises that are appropriate for their current level of function. Patients will understand the common causes for poor balance, possible solutions to these problems, and ways to modify their physical environment in order to minimize their fall risk. The purpose of this lesson is to teach patients about the importance of maintaining balance as they age and ways to minimize their risk of falling.  WORKSHOPS   Nutrition:  Fueling a Ship broker led group instruction and group discussion with PowerPoint presentation and patient guidebook. To enhance the learning environment the use of posters, models and videos may be added. Patients will review the foundational principles of the Pritikin Eating Plan and understand  what constitutes a serving size in each of the food groups. Patients will also learn Pritikin-friendly foods that are better choices when away from home and review make-ahead meal and snack options. Calorie density will be reviewed and applied to three nutrition priorities: weight maintenance, weight loss, and weight gain. The purpose of this lesson is to reinforce (in a group setting) the key concepts around what patients are recommended to eat and how to apply these guidelines when away from home by planning and selecting Pritikin-friendly options. Patients will understand how calorie density may be adjusted for different weight management goals.  Mindful Eating  Clinical staff led  group instruction and group discussion with PowerPoint presentation and patient guidebook. To enhance the learning environment the use of posters, models and videos may be added. Patients will briefly review the concepts of the Pritikin Eating Plan and the importance of low-calorie dense foods. The concept of mindful eating will be introduced as well as the importance of paying attention to internal hunger signals. Triggers for non-hunger eating and techniques for dealing with triggers will be explored. The purpose of this lesson is to provide patients with the opportunity to review the basic principles of the Pritikin Eating Plan, discuss the value of eating mindfully and how to measure internal cues of hunger and fullness using the Hunger Scale. Patients will also discuss reasons for non-hunger eating and learn strategies to use for controlling emotional eating.  Targeting Your Nutrition Priorities Clinical staff led group instruction and group discussion with PowerPoint presentation and patient guidebook. To enhance the learning environment the use of posters, models and videos may be added. Patients will learn how to determine their genetic susceptibility to disease by reviewing their family history. Patients will gain insight into the importance of diet as part of an overall healthy lifestyle in mitigating the impact of genetics and other environmental insults. The purpose of this lesson is to provide patients with the opportunity to assess their personal nutrition priorities by looking at their family history, their own health history and current risk factors. Patients will also be able to discuss ways of prioritizing and modifying the Pritikin Eating Plan for their highest risk areas  Menu  Clinical staff led group instruction and group discussion with PowerPoint presentation and patient guidebook. To enhance the learning environment the use of posters, models and videos may be added. Using menus  brought in from [employer], or printed from [employer], patients will apply the Pritikin dining out guidelines that were presented in the [employer] video. Patients will also be able to practice these guidelines in a variety of provided scenarios. The purpose of this lesson is to provide patients with the opportunity to practice hands-on learning of the Pritikin Dining Out guidelines with actual menus and practice scenarios.  Label Reading Clinical staff led group instruction and group discussion with PowerPoint presentation and patient guidebook. To enhance the learning environment the use of posters, models and videos may be added. Patients will review and discuss the Pritikin label reading guidelines presented in Pritikin's Label Reading Educational series video. Using fool labels brought in from local grocery stores and markets, patients will apply the label reading guidelines and determine if the packaged food meet the Pritikin guidelines. The purpose of this lesson is to provide patients with the opportunity to review, discuss, and practice hands-on learning of the Pritikin Label Reading guidelines with actual packaged food labels. Cooking School  Pritikin's [employer] are designed to teach  patients ways to prepare quick, simple, and affordable recipes at home. The importance of nutrition's role in chronic disease risk reduction is reflected in its emphasis in the overall Pritikin program. By learning how to prepare essential core Pritikin Eating Plan recipes, patients will increase control over what they eat; be able to customize the flavor of foods without the use of added salt, sugar, or fat; and improve the quality of the food they consume. By learning a set of core recipes which are easily assembled, quickly prepared, and affordable, patients are more likely to prepare more healthy foods at home. These workshops focus on convenient breakfasts, simple  entres, side dishes, and desserts which can be prepared with minimal effort and are consistent with nutrition recommendations for cardiovascular risk reduction. Cooking [employer] are taught by a Armed forces logistics/support/administrative officer (RD) who has been trained by the [employer]. The chef or RD has a clear understanding of the importance of minimizing - if not completely eliminating - added fat, sugar, and sodium in recipes. Throughout the series of Cooking School Workshop sessions, patients will learn about healthy ingredients and efficient methods of cooking to build confidence in their capability to prepare    Cooking School weekly topics:  Adding Flavor- Sodium-Free  Fast and Healthy Breakfasts  Powerhouse Plant-Based Proteins  Satisfying Salads and Dressings  Simple Sides and Sauces  International Cuisine-Spotlight on the [employer] Zones  Delicious Desserts  Savory Soups  [employer] - Meals in a Astronomer Appetizers and Snacks  Comforting Weekend Breakfasts  One-Pot Wonders   Fast Evening Meals  Landscape architect Your Pritikin Plate  WORKSHOPS   Healthy Mindset (Psychosocial):  Focused Goals, Sustainable Changes Clinical staff led group instruction and group discussion with PowerPoint presentation and patient guidebook. To enhance the learning environment the use of posters, models and videos may be added. Patients will be able to apply effective goal setting strategies to establish at least one personal goal, and then take consistent, meaningful action toward that goal. They will learn to identify common barriers to achieving personal goals and develop strategies to overcome them. Patients will also gain an understanding of how our mind-set can impact our ability to achieve goals and the importance of cultivating a positive and growth-oriented mind-set. The purpose of this lesson is to provide patients with a deeper understanding of how to set and  achieve personal goals, as well as the tools and strategies needed to overcome common obstacles which may arise along the way.  From Head to Heart: The Power of a Healthy Outlook  Clinical staff led group instruction and group discussion with PowerPoint presentation and patient guidebook. To enhance the learning environment the use of posters, models and videos may be added. Patients will be able to recognize and describe the impact of emotions and mood on physical health. They will discover the importance of self-care and explore self-care practices which may work for them. Patients will also learn how to utilize the 4 C's to cultivate a healthier outlook and better manage stress and challenges. The purpose of this lesson is to demonstrate to patients how a healthy outlook is an essential part of maintaining good health, especially as they continue their cardiac rehab journey.  Healthy Sleep for a Healthy Heart Clinical staff led group instruction and group discussion with PowerPoint presentation and patient guidebook. To enhance the learning environment the use of posters, models and videos may be added. At the conclusion of this  workshop, patients will be able to demonstrate knowledge of the importance of sleep to overall health, well-being, and quality of life. They will understand the symptoms of, and treatments for, common sleep disorders. Patients will also be able to identify daytime and nighttime behaviors which impact sleep, and they will be able to apply these tools to help manage sleep-related challenges. The purpose of this lesson is to provide patients with a general overview of sleep and outline the importance of quality sleep. Patients will learn about a few of the most common sleep disorders. Patients will also be introduced to the concept of "sleep hygiene," and discover ways to self-manage certain sleeping problems through simple daily behavior changes. Finally, the workshop will motivate  patients by clarifying the links between quality sleep and their goals of heart-healthy living.   Recognizing and Reducing Stress Clinical staff led group instruction and group discussion with PowerPoint presentation and patient guidebook. To enhance the learning environment the use of posters, models and videos may be added. At the conclusion of this workshop, patients will be able to understand the types of stress reactions, differentiate between acute and chronic stress, and recognize the impact that chronic stress has on their health. They will also be able to apply different coping mechanisms, such as reframing negative self-talk. Patients will have the opportunity to practice a variety of stress management techniques, such as deep abdominal breathing, progressive muscle relaxation, and/or guided imagery.  The purpose of this lesson is to educate patients on the role of stress in their lives and to provide healthy techniques for coping with it.  Learning Barriers/Preferences:  Learning Barriers/Preferences - 01/22/23 1238       Learning Barriers/Preferences   Learning Barriers Sight    Learning Preferences Audio;Verbal Instruction;Video;Computer/Internet;Written Material;Group Instruction;Individual Instruction;Pictoral;Skilled Demonstration             Education Topics:  Knowledge Questionnaire Score:  Knowledge Questionnaire Score - 01/22/23 1238       Knowledge Questionnaire Score   Pre Score 21/24             Core Components/Risk Factors/Patient Goals at Admission:  Personal Goals and Risk Factors at Admission - 01/22/23 1210       Core Components/Risk Factors/Patient Goals on Admission    Weight Management Yes;Obesity;Weight Loss    Intervention Weight Management: Develop a combined nutrition and exercise program designed to reach desired caloric intake, while maintaining appropriate intake of nutrient and fiber, sodium and fats, and appropriate energy expenditure  required for the weight goal.;Weight Management: Provide education and appropriate resources to help participant work on and attain dietary goals.;Weight Management/Obesity: Establish reasonable short term and long term weight goals.;Obesity: Provide education and appropriate resources to help participant work on and attain dietary goals.    Admit Weight 310 lb 13.6 oz (141 kg)    Expected Outcomes Short Term: Continue to assess and modify interventions until short term weight is achieved;Long Term: Adherence to nutrition and physical activity/exercise program aimed toward attainment of established weight goal;Weight Loss: Understanding of general recommendations for a balanced deficit meal plan, which promotes 1-2 lb weight loss per week and includes a negative energy balance of [phone] kcal/d;Understanding recommendations for meals to include 15-35% energy as protein, 25-35% energy from fat, 35-60% energy from carbohydrates, less than 200mg  of dietary cholesterol, 20-35 gm of total fiber daily;Understanding of distribution of calorie intake throughout the day with the consumption of 4-5 meals/snacks    Hypertension Yes    Intervention Provide education  on lifestyle modifcations including regular physical activity/exercise, weight management, moderate sodium restriction and increased consumption of fresh fruit, vegetables, and low fat dairy, alcohol moderation, and smoking cessation.;Monitor prescription use compliance.    Expected Outcomes Short Term: Continued assessment and intervention until BP is < 140/11mm HG in hypertensive participants. < 130/70mm HG in hypertensive participants with diabetes, heart failure or chronic kidney disease.;Long Term: Maintenance of blood pressure at goal levels.    Stress Yes    Intervention Offer individual and/or small group education and counseling on adjustment to heart disease, stress management and health-related lifestyle change. Teach and support self-help  strategies.;Refer participants experiencing significant psychosocial distress to appropriate mental health specialists for further evaluation and treatment. When possible, include family members and significant others in education/counseling sessions.    Expected Outcomes Short Term: Participant demonstrates changes in health-related behavior, relaxation and other stress management skills, ability to obtain effective social support, and compliance with psychotropic medications if prescribed.;Long Term: Emotional wellbeing is indicated by absence of clinically significant psychosocial distress or social isolation.             Core Components/Risk Factors/Patient Goals Review:    Core Components/Risk Factors/Patient Goals at Discharge (Final Review):    ITP Comments:  ITP Comments     Row Name 01/22/23 0930           ITP Comments Armanda Magic, MD: Medical Director.  Patient introduced to the [facility]/Intensive Cardiac Rehab.  Initial orientation packet reviewed with the patient today.                Comments: Participant attended orientation for the cardiac rehabilitation program on  01/22/2023  to perform initial intake and exercise walk test. Patient introduced to the [facility] education and orientation packet was reviewed. Completed 6-minute walk test, measurements, initial ITP, and exercise prescription. Vital signs stable. Telemetry- chronic Atrial Fibrillation. Pt became mildly symptomatic with SOB when walking due to going over THR. Pt required 2 rest breaks to let his heart rate come back with THRR. Pt is deconditioned and used a Go-Cart for the walk test.   Service time was from 9:18 to 12:08 .

## 2023-01-22 NOTE — Progress Notes (Signed)
Cardiac Rehab Medication Review   Does the patient  feel that his/her medications are working for him/her?  yes  Has the patient been experiencing any side effects to the medications prescribed?  no  Does the patient measure his/her own blood pressure or blood glucose at home?  yes   Does the patient have any problems obtaining medications due to transportation or finances?   no  Understanding of regimen: excellent Understanding of indications: excellent Potential of compliance: excellent    Comments: Pt checks blood pressure at home several times per day.    Lorin Picket 01/22/2023 9:42 AM

## 2023-01-26 ENCOUNTER — Encounter (HOSPITAL_COMMUNITY)
Admission: RE | Admit: 2023-01-26 | Discharge: 2023-01-26 | Disposition: A | Discharge status: Home / Self Care | Payer: MEDICARE | Source: Ambulatory Visit | Attending: Cardiology | Admitting: Cardiology

## 2023-01-26 DIAGNOSIS — Z952 Presence of prosthetic heart valve: Secondary | ICD-10-CM | POA: Diagnosis not present

## 2023-01-26 NOTE — Progress Notes (Signed)
Daily Session Note  Patient Details  Name: Robert [name] MRN: [id number] Date of Birth: 11/29/1950 Referring Provider:   Flowsheet Row INTENSIVE CARDIAC REHAB ORIENT from 01/22/2023 in [facility]  Referring Provider Jodelle Red, MD       Encounter Date: 01/26/2023  Check In:  Session Check In - 01/26/23 1237       Check-In   Supervising physician immediately available to respond to emergencies [facility] - Physician supervision    Physician(s) Carlos Levering, NP    Location [facility]    Staff Present Lorin Picket, MS, ACSM-CEP, CCRP, Exercise Physiologist;Olinty Peggye Pitt, MS, ACSM-CEP, Exercise Physiologist; Gerre Scull, RN, BSN;Jetta Walker BS, ACSM-CEP, Exercise Physiologist    Virtual Visit No    Medication changes reported     No    Fall or balance concerns reported    No    Tobacco Cessation No Change    Warm-up and Cool-down Performed as group-led instruction   CRP2 orientation   Resistance Training Performed Yes    VAD Patient? No    PAD/SET Patient? No      Pain Assessment   Currently in Pain? No/denies    Pain Score 0-No pain    Multiple Pain Sites No             Capillary Blood Glucose: No results found for this or any previous visit (from the past 24 hour(s)).    Social History   Tobacco Use  Smoking Status Never  Smokeless Tobacco Never    Goals Met:  Exercise tolerated well No report of concerns or symptoms today Strength training completed today  Goals Unmet:  Not Applicable  Comments: Pt started cardiac rehab today. Pt tolerated light exercise, HR went above THR, had to have patient sit and rest. VSS, telemetry- Afib Rhythm, asymptomatic. Medication list reconciled. Pt denies barriers to medicaiton compliance. PSYCHOSOCIAL ASSESSMENT:  PHQ-5. Pt exhibits positive coping skills, hopeful outlook with supportive family. Pt declines a referral to a mental  health professional. No psychosocial needs identified at this time, no psychosocial interventions necessary. Pt enjoys working on cars, re-modeling houses, and DIY projects. Pt oriented to exercise equipment and routine. Understanding verbalized.   Dr. Armanda Magic is Medical Director for Cardiac Rehab at [facility].

## 2023-01-27 ENCOUNTER — Ambulatory Visit (INDEPENDENT_AMBULATORY_CARE_PROVIDER_SITE_OTHER): Payer: MEDICARE

## 2023-01-27 VITALS — BP 118/70 | Ht 71.0 in | Wt 314.0 lb

## 2023-01-27 DIAGNOSIS — Z0000 Encounter for general adult medical examination without abnormal findings: Secondary | ICD-10-CM | POA: Diagnosis not present

## 2023-01-27 NOTE — Progress Notes (Addendum)
Subjective:   Robert [name] is a 72 y.o. male who presents for Medicare Annual/Subsequent preventive examination.  Visit Complete: Virtual  I connected with  Robert [name] on 01/27/23 by a audio enabled telemedicine application and verified that I am speaking with the correct person using two identifiers.  Patient Location: Home  Provider Location: Office/Clinic  I discussed the limitations of evaluation and management by telemedicine. The patient expressed understanding and agreed to proceed.  Patient Medicare AWV questionnaire was completed by the patient on 01/23/2023; I have confirmed that all information answered by patient is correct and no changes since this date.  Vital Signs: Per patient no change in vitals since last visit. Vital signs used from last visit on 01/15/2023.   Review of Systems     Cardiac Risk Factors include: advanced age (>95men, >18 women);dyslipidemia;hypertension;male gender;obesity (BMI >30kg/m2);sedentary lifestyle;family history of premature cardiovascular disease     Objective:    Today's Vitals   01/27/23 1534 01/27/23 1550  BP: 118/70   Weight: (!) 314 lb (142.4 kg)   Height: 5\' 11"  (1.803 m)   PainSc: 0-No pain 0-No pain   Body mass index is 43.79 kg/m.     01/27/2023    3:48 PM 01/06/2023    5:47 AM 12/16/2022    8:02 AM 01/20/2022    3:41 PM 05/28/2020    2:16 PM 04/10/2020   10:02 AM 03/28/2020    8:00 PM  Advanced Directives  Does Patient Have a Medical Advance Directive? Yes Yes Yes Yes Yes Yes Yes  Type of Estate agent of [place];Living will Healthcare Power of [place];Living will Living will;Healthcare Power of Attorney Out of facility DNR (pink MOST or yellow form) Healthcare Power of [employer] Power of [place];Living will Healthcare Power of [place];Living will  Does patient want to make changes to medical advance directive?    No - Patient declined  No - Patient declined No - Patient  declined  Copy of Healthcare Power of Attorney in Chart? No - copy requested No - copy requested   No - copy requested  Yes - validated most recent copy scanned in chart (See row information)  Would patient like information on creating a medical advance directive?     No - Patient declined No - Patient declined   Pre-existing out of facility DNR order (yellow form or pink MOST form)    Pink MOST/Yellow Form most recent copy in chart - Physician notified to receive inpatient order       Current Medications (verified) Outpatient Encounter Medications as of 01/27/2023  Medication Sig   albuterol (VENTOLIN HFA) 108 (90 Base) MCG/ACT inhaler Inhale 2 puffs into the lungs every 6 (six) hours as needed for wheezing or shortness of breath.   allopurinol (ZYLOPRIM) 100 MG tablet Take 1 tablet (100 mg total) by mouth daily.   celecoxib (CELEBREX) 200 MG capsule Take 1 capsule (200 mg total) by mouth 2 (two) times daily. (Patient taking differently: Take 200 mg by mouth 2 (two) times daily as needed for moderate pain.)   cetirizine (ZYRTEC) 10 MG tablet Take 1 tablet by mouth once daily (Patient taking differently: Take 10 mg by mouth at bedtime.)   Cholecalciferol (VITAMIN D3) 125 MCG (5000 UT) CAPS Take 1 capsule (5,000 Units total) by mouth daily.   colchicine 0.6 MG tablet Take two tablets prn gout attack.Then take another one in 1-2 hrs. Do not repeat for 3 days.   doxycycline (MONODOX) 100 MG  capsule Take 1 capsule (100 mg total) by mouth as directed. 1 hour before any dental work, including cleanings.   famotidine (PEPCID) 40 MG tablet Take 1 tablet by mouth once daily (Patient taking differently: Take 40 mg by mouth daily as needed for heartburn or indigestion.)   faricimab-svoa (VABYSMO) 6 MG/0.05ML SOLN intravitreal injection 6 mg by Intravitreal route once. Injections every 8 weeks   fish oil-omega-3 fatty acids 1000 MG capsule Take 1 g by mouth every morning.   furosemide (LASIX) 20 MG tablet TAKE  LASIX 40 MG TWICE DAILY FOR THREE DAYS, THEN DECREASE TO 40 MG IN THE AM AND 20 MG IN THE PM BY MOUTH   glucosamine-chondroitin 500-400 MG tablet Take 1 tablet by mouth every morning.   mupirocin ointment (BACTROBAN) 2 % Place 1 Application into the nose 2 (two) times daily.   nortriptyline (PAMELOR) 25 MG capsule TAKE 1 CAPSULE AT BEDTIME   potassium chloride (KLOR-CON) 10 MEQ tablet START POTASSIUM 10 MG TWICE DAILY FOR 3 DAYS, THEN DECREASE TO 10 MG DAILY.   predniSONE (DELTASONE) 5 MG tablet TAKE 1 TABLET DAILY WITH BREAKFAST   pregabalin (LYRICA) 75 MG capsule Take 1 capsule (75 mg total) by mouth 3 (three) times daily as needed.   triamcinolone cream (KENALOG) 0.1 % Apply 1 Application topically 3 (three) times daily. (Patient taking differently: Apply 1 Application topically daily as needed (irritation).)   warfarin (COUMADIN) 3 MG tablet TAKE 1 TABLET DAILY OR TAKE AS DIRECTED BY ANTICOAGULATION CLINIC (Patient taking differently: Take 3 mg by mouth See admin instructions. TAKE 1 TABLET DAILY OR TAKE AS DIRECTED BY ANTICOAGULATION CLINIC; 6 mg (6 mg x 1) every Fri; 3 mg (6 mg x 0.5) all other days)   warfarin (COUMADIN) 6 MG tablet TAKE 1/2 TABLET BY MOUTH DAILY EXCEPT TAKE 1 TABLET ON FRIDAYS OR AS DIRECTED BY ANTICOAGULATION CLINIC (Patient taking differently: Take 6 mg by mouth See admin instructions. TAKE 1/2 TABLET BY MOUTH DAILY EXCEPT TAKE 1 TABLET ON FRIDAYS OR AS DIRECTED BY ANTICOAGULATION CLINIC; 6 mg (6 mg x 1) every Fri; 3 mg (6 mg x 0.5) all other days)   No facility-administered encounter medications on file as of 01/27/2023.    Allergies (verified) Cardizem [diltiazem], Lopressor [metoprolol tartrate], Sulfa antibiotics, Amiodarone, Anoro ellipta [umeclidinium-vilanterol], Breo ellipta [fluticasone furoate-vilanterol], Exalgo [hydromorphone hcl], Gabapentin, Hydrocodone-acetaminophen, Penicillins, Sulfonamide derivatives, Tylenol [acetaminophen], and Cat hair extract    History: Past Medical History:  Diagnosis Date   Anxiety    Asthma    "no attack since acupuncture in 1990's"   Atrial fibrillation (HCC)    Complication of anesthesia    SLOW TO WAKE UP / DIFFICULTY RESPONDING 2003, 3 days before could use left arm, "wild/crazy sometimes"   COPD (chronic obstructive pulmonary disease) (HCC)    Cyst of left kidney    "benign"   DVT (deep venous thrombosis) (HCC) 06/23/2001   right femoral artery "from groin to knee"   Edema    Right Leg w/ h/o DVT postphlebitic   H/O blood transfusion reaction 06/23/2002   FFP, extreme swelling, could not breath   History of pulmonary embolism 06/23/2001   both lungs   Hyperlipidemia    Knee pain    left   LBP (low back pain)    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, clear in 2012"   Obesity    Osteoarthritis    Peripheral vascular disease (HCC)    "poor circulation in  RT leg"  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 Lynnette Caffey and Dr. Leafy Ro   Severe aortic stenosis    Spondylolisthesis    Warfarin anticoagulation    Wheezing    when laying on left side   Past Surgical History:  Procedure Laterality Date   APPENDECTOMY  06/24/1991   Dr Jamey Ripa   CATARACT EXTRACTION W/ INTRAOCULAR LENS IMPLANT Bilateral    May/june 2024   CHOLECYSTECTOMY  06/24/1991   FINGER SURGERY  06/23/2010   RT THUMB RECONSTRUCTION   HAND SURGERY Right    abcess   HEMORROIDECTOMY  06/24/1979   I & D KNEE WITH POLY EXCHANGE Left 04/04/2013   Procedure: IRRIGATION AND DEBRIDEMENT KNEE WITH POLY EXCHANGE AND INSERTION OF CEMENT BEADS;  Surgeon: Loanne Drilling, MD;  Location: [facility];  Service: Orthopedics;  Laterality: Left;   INTRAOPERATIVE TRANSTHORACIC ECHOCARDIOGRAM N/A 01/06/2023   Procedure: INTRAOPERATIVE TRANSTHORACIC ECHOCARDIOGRAM;  Surgeon: [name], MD;  Location: [facility];  Service: Open Heart Surgery;   Laterality: N/A;   KNEE ARTHROSCOPY  06/23/1988   RT KNEE   KNEE ARTHROSCOPY Right 06/23/2001   KNEE ARTHROSCOPY  06/23/1986   L KNEE   KNEE ARTHROSCOPY WITH MENISCAL REPAIR Left 10/22/2012   Procedure: LEFT KNEE ARTHROSCOPY PARTIAL MEDIAL AND LATERAL MENISCECTOMY AND DEBRIDEMENT, CHONDROPLASTY ;  Surgeon: Javier Docker, MD;  Location: [facility];  Service: Orthopedics;  Laterality: Left;   LUMBAR DISC SURGERY     LUMBAR FUSION  06/23/2001   Dr Jillyn Hidden   ORCHIECTOMY  06/23/1992   R post injury   RIGHT HEART CATH AND CORONARY ANGIOGRAPHY N/A 12/16/2022   Procedure: RIGHT HEART CATH AND CORONARY ANGIOGRAPHY;  Surgeon: [name], MD;  Location: [facility];  Service: Cardiovascular;  Laterality: N/A;   TONSILLECTOMY  06/23/1965   TOTAL KNEE ARTHROPLASTY Left 03/21/2013   Procedure: LEFT TOTAL KNEE ARTHROPLASTY;  Surgeon: Loanne Drilling, MD;  Location: [facility];  Service: Orthopedics;  Laterality: Left;   TRANSCATHETER AORTIC VALVE REPLACEMENT, TRANSFEMORAL Right 01/06/2023   Procedure: Transcatheter Aortic Valve Replacement, Transfemoral;  Surgeon: [name], MD;  Location: [facility];  Service: Open Heart Surgery;  Laterality: Right;   Family History  Problem Relation Age of Onset   Heart disease Mother        CABG at age 8   Hyperlipidemia Other    Hypertension Other    Parkinsonism Other    Heart disease Sister    Social History   Socioeconomic History   Marital status: Married    Spouse name: Not on file   Number of children: 3   Years of education: Not on file   Highest education level: Bachelor's degree (e.g., BA, AB, BS)  Occupational History   Occupation: Disabled    Employer: DISABLED  Tobacco Use   Smoking status: Never   Smokeless tobacco: Never  Vaping Use   Vaping status: Never Used  Substance and Sexual Activity   Alcohol use: Never   Drug use: Never   Sexual activity: Yes  Other Topics Concern   Not on file  Social History  Narrative   Right handed   One story home   Drinks caffeine coffee q am   Social Determinants of Health   Financial Resource Strain: Patient Declined (01/27/2023)   Overall Financial Resource Strain (CARDIA)    Difficulty of Paying Living Expenses: Patient  declined  Food Insecurity: Patient Declined (01/27/2023)   Hunger Vital Sign    Worried About Running Out of Food in the Last Year: Patient declined    Ran Out of Food in the Last Year: Patient declined  Transportation Needs: No Transportation Needs (01/27/2023)   PRAPARE - Administrator, Civil Service (Medical): No    Lack of Transportation (Non-Medical): No  Physical Activity: Patient Declined (01/27/2023)   Exercise Vital Sign    Days of Exercise per Week: Patient declined    Minutes of Exercise per Session: Patient declined  Stress: No Stress Concern Present (01/27/2023)   [employer] of Occupational Health - Occupational Stress Questionnaire    Feeling of Stress : Only a little  Social Connections: Unknown (01/27/2023)   Social Connection and Isolation Panel [NHANES]    Frequency of Communication with Friends and Family: Once a week    Frequency of Social Gatherings with Friends and Family: Once a week    Attends Religious Services: Patient declined    Database administrator or Organizations: Yes    Attends Engineer, structural: Patient declined    Marital Status: Married    Tobacco Counseling Counseling given: Not Answered   Clinical Intake:  Pre-visit preparation completed: Yes  Pain : No/denies pain Pain Score: 0-No pain     BMI - recorded: 43.79 Nutritional Status: BMI > 30  Obese Nutritional Risks: None Diabetes: No  How often do you need to have someone help you when you read instructions, pamphlets, or other written materials from your doctor or pharmacy?: 1 - Never What is the last grade level you completed in school?: HSG  Interpreter Needed?: No  Information entered by ::   N. , LPN.   Activities of Daily Living    01/27/2023    3:44 PM 01/23/2023   12:52 PM  In your present state of health, do you have any difficulty performing the following activities:  Hearing? 0 0  Vision? 0 0  Difficulty concentrating or making decisions? 0 0  Walking or climbing stairs? 0   Dressing or bathing? 0 0  Doing errands, shopping? 0 0  Preparing Food and eating ? N N  Using the Toilet? N N  In the past six months, have you accidently leaked urine? Y Y  Do you have problems with loss of bowel control? N N  Managing your Medications? N N  Managing your Finances? N N  Housekeeping or managing your Housekeeping? N N    Patient Care Team: [name], Georgina Quint, MD as PCP - General Jodelle Red, MD as PCP - Cardiology (Cardiology) Oretha Milch, MD (Pulmonary Disease) Jene Every, MD (Orthopedic Surgery) Ollen Gross, MD as Attending Physician (Orthopedic Surgery) Wendall Stade, MD as Consulting Physician (Cardiology) Hillis Range, MD (Inactive) as Consulting Physician (Cardiology) Drema Dallas, DO as Consulting Physician (Neurology) Albin Felling, OD as Consulting Physician (Optometry) Jenene Slicker, Deberah Castle, MD as Consulting Physician (Ophthalmology) Jodelle Red, MD as Consulting Physician (Cardiology)  Indicate any recent Medical Services you may have received from other than [facility] providers in the past year (date may be approximate).     Assessment:   This is a routine wellness examination for [facility].  Hearing/Vision screen Hearing Screening - Comments:: Patient denied any hearing difficulty.   No hearing aids.  Vision Screening - Comments:: Patient does wear corrective lenses/contacts.  Annual eye exam done by: [facility]    Dietary issues and exercise  activities discussed:     Goals Addressed   None   Depression Screen    01/27/2023    3:46 PM 01/22/2023   12:02 PM 10/16/2022    9:39 AM 01/20/2022     3:47 PM 08/08/2021    8:27 AM 05/31/2020   11:00 AM 05/28/2020    2:20 PM  PHQ 2/9 Scores  PHQ - 2 Score 0 0 0 0 0 0 0  PHQ- 9 Score 5 5         Fall Risk    01/27/2023    3:37 PM 01/26/2023   12:38 PM 01/23/2023   12:52 PM 01/22/2023   12:02 PM 10/16/2022    9:39 AM  Fall Risk   Falls in the past year? 0 0 0 0 0  Number falls in past yr: 0 0  0 0  Injury with Fall? 0 0  0 0  Risk for fall due to : History of fall(s);Impaired balance/gait;Impaired mobility;Orthopedic patient;Medication side effect Impaired balance/gait;Impaired mobility;Impaired vision;Orthopedic patient;Medication side effect  Impaired balance/gait;Impaired mobility;Impaired vision;Orthopedic patient;Medication side effect No Fall Risks  Follow up Falls prevention discussed Falls evaluation completed  Falls evaluation completed Falls evaluation completed    MEDICARE RISK AT HOME:  Medicare Risk at Home - 01/27/23 1540     Any stairs in or around the home? Yes    If so, are there any without handrails? No    Home free of loose throw rugs in walkways, pet beds, electrical cords, etc? Yes    Adequate lighting in your home to reduce risk of falls? Yes    Life alert? No    Use of a cane, walker or w/c? No    Grab bars in the bathroom? Yes    Shower chair or bench in shower? Yes    Elevated toilet seat or a handicapped toilet? Yes             TIMED UP AND GO:  Was the test performed?  No    Cognitive Function:        01/27/2023    3:42 PM 01/20/2022    3:49 PM  6CIT Screen  What Year? 0 points 0 points  What month? 0 points 0 points  What time? 0 points 0 points  Count back from 20 0 points 0 points  Months in reverse 0 points 0 points  Repeat phrase 0 points 0 points  Total Score 0 points 0 points    Immunizations Immunization History  Administered Date(s) Administered   Fluad Quad(high Dose 65+) 03/21/2019   Influenza Split 03/22/2012   Influenza Whole 03/17/2008, 05/15/2009, 02/13/2010    Influenza, High Dose Seasonal PF 03/26/2018   Influenza,inj,Quad PF,6+ Mos 03/14/2013, 05/09/2014, 03/06/2015, 02/18/2016   Influenza-Unspecified 03/04/2017   Pneumococcal Conjugate-13 08/19/2013   Pneumococcal Polysaccharide-23 04/04/2008, 02/05/2018   Td 06/27/2002   Tdap 09/16/2012   Zoster, Live 06/06/2014    TDAP status: Due, Education has been provided regarding the importance of this vaccine. Advised may receive this vaccine at local pharmacy or Health Dept. Aware to provide a copy of the vaccination record if obtained from local pharmacy or Health Dept. Verbalized acceptance and understanding.  Flu Vaccine status: Up to date  Pneumococcal vaccine status: Up to date  Covid-19 vaccine status: Completed vaccines  Qualifies for Shingles Vaccine? Yes   Zostavax completed Yes   Shingrix Completed?: No.    Education has been provided regarding the importance of this vaccine. Patient has been advised to  call insurance company to determine out of pocket expense if they have not yet received this vaccine. Advised may also receive vaccine at local pharmacy or Health Dept. Verbalized acceptance and understanding.  Screening Tests Health Maintenance  Topic Date Due   Zoster Vaccines- Shingrix (1 of 2) 09/21/1970   DTaP/Tdap/Td (3 - Td or Tdap) 09/17/2022   INFLUENZA VACCINE  01/22/2023   Medicare Annual Wellness (AWV)  01/27/2024   Pneumonia Vaccine 6+ Years old  Completed   Hepatitis C Screening  Completed   HPV VACCINES  Aged Out   Colonoscopy  Discontinued   COVID-19 Vaccine  Discontinued    Health Maintenance  Health Maintenance Due  Topic Date Due   Zoster Vaccines- Shingrix (1 of 2) 09/21/1970   DTaP/Tdap/Td (3 - Td or Tdap) 09/17/2022   INFLUENZA VACCINE  01/22/2023    Colorectal Cancer screening: Patient declined.  Lung Cancer Screening: (Low Dose CT Chest recommended if Age 18-80 years, 20 pack-year currently smoking OR have quit w/in 15years.) does not qualify.    Lung Cancer Screening Referral: no  Additional Screening:  Hepatitis C Screening: does qualify; Completed 02/18/2016  Vision Screening: Recommended annual ophthalmology exams for early detection of glaucoma and other disorders of the eye. Is the patient up to date with their annual eye exam?  Yes  Who is the provider or what is the name of the office in which the patient attends annual eye exams? [facility]/Dr. Laverna Peace If pt is not established with a provider, would they like to be referred to a provider to establish care? No .   Dental Screening: Recommended annual dental exams for proper oral hygiene  Diabetic Foot Exam: Does not apply.  Community Resource Referral / Chronic Care Management: CRR required this visit?  No   CCM required this visit?  No     Plan:     I have personally reviewed and noted the following in the patient's chart:   Medical and social history Use of alcohol, tobacco or illicit drugs  Current medications and supplements including opioid prescriptions. Patient is not currently taking opioid prescriptions. Functional ability and status Nutritional status Physical activity Advanced directives List of other physicians Hospitalizations, surgeries, and ER visits in previous 12 months Vitals Screenings to include cognitive, depression, and falls Referrals and appointments  In addition, I have reviewed and discussed with patient certain preventive protocols, quality metrics, and best practice recommendations. A written personalized care plan for preventive services as well as general preventive health recommendations were provided to patient.     Mickeal Needy, LPN   06/28/1094   After Visit Summary: (Mail) Due to this being a telephonic visit, the after visit summary with patients personalized plan was offered to patient via mail   Nurse Notes: Normal cognitive status assessed by direct observation via telephone conversation by  this Nurse Health Advisor. No abnormalities found.  Medical screening examination/treatment/procedure(s) were performed by non-physician practitioner and as supervising physician I was immediately available for consultation/collaboration.  I agree with above. Jacinta Shoe, MD

## 2023-01-27 NOTE — Patient Instructions (Addendum)
[name] , Thank you for taking time to come for your Medicare Wellness Visit. I appreciate your ongoing commitment to your health goals. Please review the following plan we discussed and let me know if I can assist you in the future.   Referrals/Orders/Follow-Ups/Clinician Recommendations: No  This is a list of the screening recommended for you and due dates:  Health Maintenance  Topic Date Due   Zoster (Shingles) Vaccine (1 of 2) 09/21/1970   DTaP/Tdap/Td vaccine (3 - Td or Tdap) 09/17/2022   Flu Shot  01/22/2023   Medicare Annual Wellness Visit  01/27/2024   Pneumonia Vaccine  Completed   Hepatitis C Screening  Completed   HPV Vaccine  Aged Out   Colon Cancer Screening  Discontinued   COVID-19 Vaccine  Discontinued    Advanced directives: (Copy Requested) Please bring a copy of your health care power of attorney and living will to the office to be added to your chart at your convenience.  Next Medicare Annual Wellness Visit scheduled for next year: Yes  Preventive Care 34 Years and Older, Male  Preventive care refers to lifestyle choices and visits with your health care provider that can promote health and wellness. What does preventive care include? A yearly physical exam. This is also called an annual well check. Dental exams once or twice a year. Routine eye exams. Ask your health care provider how often you should have your eyes checked. Personal lifestyle choices, including: Daily care of your teeth and gums. Regular physical activity. Eating a healthy diet. Avoiding tobacco and drug use. Limiting alcohol use. Practicing safe sex. Taking low doses of aspirin every day. Taking vitamin and mineral supplements as recommended by your health care provider. What happens during an annual well check? The services and screenings done by your health care provider during your annual well check will depend on your age, overall health, lifestyle risk factors, and family history  of disease. Counseling  Your health care provider may ask you questions about your: Alcohol use. Tobacco use. Drug use. Emotional well-being. Home and relationship well-being. Sexual activity. Eating habits. History of falls. Memory and ability to understand (cognition). Work and work Astronomer. Screening  You may have the following tests or measurements: Height, weight, and BMI. Blood pressure. Lipid and cholesterol levels. These may be checked every 5 years, or more frequently if you are over 60 years old. Skin check. Lung cancer screening. You may have this screening every year starting at age 75 if you have a 30-pack-year history of smoking and currently smoke or have quit within the past 15 years. Fecal occult blood test (FOBT) of the stool. You may have this test every year starting at age 71. Flexible sigmoidoscopy or colonoscopy. You may have a sigmoidoscopy every 5 years or a colonoscopy every 10 years starting at age 61. Prostate cancer screening. Recommendations will vary depending on your family history and other risks. Hepatitis C blood test. Hepatitis B blood test. Sexually transmitted disease (STD) testing. Diabetes screening. This is done by checking your blood sugar (glucose) after you have not eaten for a while (fasting). You may have this done every 1-3 years. Abdominal aortic aneurysm (AAA) screening. You may need this if you are a current or former smoker. Osteoporosis. You may be screened starting at age 23 if you are at high risk. Talk with your health care provider about your test results, treatment options, and if necessary, the need for more tests. Vaccines  Your health care provider  may recommend certain vaccines, such as: Influenza vaccine. This is recommended every year. Tetanus, diphtheria, and acellular pertussis (Tdap, Td) vaccine. You may need a Td booster every 10 years. Zoster vaccine. You may need this after age 32. Pneumococcal 13-valent  conjugate (PCV13) vaccine. One dose is recommended after age 35. Pneumococcal polysaccharide (PPSV23) vaccine. One dose is recommended after age 31. Talk to your health care provider about which screenings and vaccines you need and how often you need them. This information is not intended to replace advice given to you by your health care provider. Make sure you discuss any questions you have with your health care provider. Document Released: 07/06/2015 Document Revised: 02/27/2016 Document Reviewed: 04/10/2015 Elsevier Interactive Patient Education  2017 [employer].  Fall Prevention in the Home Falls can cause injuries. They can happen to people of all ages. There are many things you can do to make your home safe and to help prevent falls. What can I do on the outside of my home? Regularly fix the edges of walkways and driveways and fix any cracks. Remove anything that might make you trip as you walk through a door, such as a raised step or threshold. Trim any bushes or trees on the path to your home. Use bright outdoor lighting. Clear any walking paths of anything that might make someone trip, such as rocks or tools. Regularly check to see if handrails are loose or broken. Make sure that both sides of any steps have handrails. Any raised decks and porches should have guardrails on the edges. Have any leaves, snow, or ice cleared regularly. Use sand or salt on walking paths during winter. Clean up any spills in your garage right away. This includes oil or grease spills. What can I do in the bathroom? Use night lights. Install grab bars by the toilet and in the tub and shower. Do not use towel bars as grab bars. Use non-skid mats or decals in the tub or shower. If you need to sit down in the shower, use a plastic, non-slip stool. Keep the floor dry. Clean up any water that spills on the floor as soon as it happens. Remove soap buildup in the tub or shower regularly. Attach bath mats  securely with double-sided non-slip rug tape. Do not have throw rugs and other things on the floor that can make you trip. What can I do in the bedroom? Use night lights. Make sure that you have a light by your bed that is easy to reach. Do not use any sheets or blankets that are too big for your bed. They should not hang down onto the floor. Have a firm chair that has side arms. You can use this for support while you get dressed. Do not have throw rugs and other things on the floor that can make you trip. What can I do in the kitchen? Clean up any spills right away. Avoid walking on wet floors. Keep items that you use a lot in easy-to-reach places. If you need to reach something above you, use a strong step stool that has a grab bar. Keep electrical cords out of the way. Do not use floor polish or wax that makes floors slippery. If you must use wax, use non-skid floor wax. Do not have throw rugs and other things on the floor that can make you trip. What can I do with my stairs? Do not leave any items on the stairs. Make sure that there are handrails on both sides  of the stairs and use them. Fix handrails that are broken or loose. Make sure that handrails are as long as the stairways. Check any carpeting to make sure that it is firmly attached to the stairs. Fix any carpet that is loose or worn. Avoid having throw rugs at the top or bottom of the stairs. If you do have throw rugs, attach them to the floor with carpet tape. Make sure that you have a light switch at the top of the stairs and the bottom of the stairs. If you do not have them, ask someone to add them for you. What else can I do to help prevent falls? Wear shoes that: Do not have high heels. Have rubber bottoms. Are comfortable and fit you well. Are closed at the toe. Do not wear sandals. If you use a stepladder: Make sure that it is fully opened. Do not climb a closed stepladder. Make sure that both sides of the stepladder  are locked into place. Ask someone to hold it for you, if possible. Clearly mark and make sure that you can see: Any grab bars or handrails. First and last steps. Where the edge of each step is. Use tools that help you move around (mobility aids) if they are needed. These include: Canes. Walkers. Scooters. Crutches. Turn on the lights when you go into a dark area. Replace any light bulbs as soon as they burn out. Set up your furniture so you have a clear path. Avoid moving your furniture around. If any of your floors are uneven, fix them. If there are any pets around you, be aware of where they are. Review your medicines with your doctor. Some medicines can make you feel dizzy. This can increase your chance of falling. Ask your doctor what other things that you can do to help prevent falls. This information is not intended to replace advice given to you by your health care provider. Make sure you discuss any questions you have with your health care provider. Document Released: 04/05/2009 Document Revised: 11/15/2015 Document Reviewed: 07/14/2014 Elsevier Interactive Patient Education  2017 [employer].

## 2023-01-28 ENCOUNTER — Ambulatory Visit: Payer: MEDICARE | Attending: Physician Assistant

## 2023-01-28 ENCOUNTER — Encounter (HOSPITAL_COMMUNITY)
Admission: RE | Admit: 2023-01-28 | Discharge: 2023-01-28 | Disposition: A | Discharge status: Home / Self Care | Payer: MEDICARE | Source: Ambulatory Visit | Attending: Cardiology | Admitting: Cardiology

## 2023-01-28 DIAGNOSIS — I5032 Chronic diastolic (congestive) heart failure: Secondary | ICD-10-CM

## 2023-01-28 DIAGNOSIS — Z952 Presence of prosthetic heart valve: Secondary | ICD-10-CM

## 2023-01-28 DIAGNOSIS — I10 Essential (primary) hypertension: Secondary | ICD-10-CM

## 2023-01-28 DIAGNOSIS — I4821 Permanent atrial fibrillation: Secondary | ICD-10-CM

## 2023-01-28 DIAGNOSIS — N2889 Other specified disorders of kidney and ureter: Secondary | ICD-10-CM | POA: Diagnosis not present

## 2023-01-28 DIAGNOSIS — N289 Disorder of kidney and ureter, unspecified: Secondary | ICD-10-CM | POA: Diagnosis not present

## 2023-01-30 ENCOUNTER — Encounter (HOSPITAL_COMMUNITY)
Admission: RE | Admit: 2023-01-30 | Discharge: 2023-01-30 | Disposition: A | Discharge status: Home / Self Care | Payer: MEDICARE | Source: Ambulatory Visit | Attending: Cardiology | Admitting: Cardiology

## 2023-01-30 ENCOUNTER — Ambulatory Visit (INDEPENDENT_AMBULATORY_CARE_PROVIDER_SITE_OTHER): Payer: MEDICARE

## 2023-01-30 DIAGNOSIS — Z952 Presence of prosthetic heart valve: Secondary | ICD-10-CM | POA: Diagnosis not present

## 2023-01-30 DIAGNOSIS — Z7901 Long term (current) use of anticoagulants: Secondary | ICD-10-CM

## 2023-01-30 LAB — POCT INR: INR: 1.9 — AB (ref 2.0–3.0)

## 2023-01-30 NOTE — Progress Notes (Signed)
Cardiac Rehab Session Note  Patient Details  Name: HORTON RHODES MRN: [id number] Date of Birth: 08-08-1950 Referring Provider:   Flowsheet Row INTENSIVE CARDIAC REHAB ORIENT from 01/22/2023 in [facility]  Referring Provider Jodelle Red, MD        Pt started Cardiac Rehab this week, today is the 3rd session. Concerned about uncontrolled Afib/SVT with SOB. Pt's THR max is 119. When patient warms up, walks, exercises, and performs cool down HR is sustaining in the 150s-160s. 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 BP's WNL. Resting HR low 100s. Pt unable to fully exercise d/t high heart rate.   Message sent to Dr. Cristal Deer for advice.

## 2023-01-30 NOTE — Progress Notes (Signed)
Increase dose today to take 1 1/2 tablet and then change weekly dosing to take 1/2 tablet daily except take 1 tablet on Mondays and Fridays.  Recheck in 3 weeks.

## 2023-01-30 NOTE — Patient Instructions (Addendum)
Pre visit review using our clinic review tool, if applicable. No additional management support is needed unless otherwise documented below in the visit note.  Increase dose today to take 1 1/2 tablet and then change weekly dosing to take 1/2 tablet daily except take 1 tablet on Mondays and Fridays.  Recheck in 3 weeks.

## 2023-02-02 ENCOUNTER — Encounter (HOSPITAL_COMMUNITY)
Admission: RE | Admit: 2023-02-02 | Discharge: 2023-02-02 | Disposition: A | Discharge status: Home / Self Care | Payer: MEDICARE | Source: Ambulatory Visit | Attending: Cardiology | Admitting: Cardiology

## 2023-02-02 DIAGNOSIS — Z952 Presence of prosthetic heart valve: Secondary | ICD-10-CM | POA: Diagnosis not present

## 2023-02-03 ENCOUNTER — Telehealth (HOSPITAL_BASED_OUTPATIENT_CLINIC_OR_DEPARTMENT_OTHER): Payer: Self-pay | Admitting: Cardiology

## 2023-02-03 NOTE — Telephone Encounter (Signed)
Returned call to Cardiac Rehab department, no answer, left message

## 2023-02-03 NOTE — Telephone Encounter (Signed)
Caller wants to get parameters for patient's cardiac rehab.

## 2023-02-03 NOTE — Telephone Encounter (Signed)
Mary from Cardiac rehab called back, transferred from call center.   CR target rehab hr max is 119, when he walks in he is already 150-160s. Need updated HR parameters. He is very short of breath when he starts exercising.

## 2023-02-03 NOTE — Telephone Encounter (Signed)
Consulted with Dr. Ovid Curd, 170 w/ exertion can be new max for cardiac rehab. Information left on secure VM.

## 2023-02-04 ENCOUNTER — Encounter (HOSPITAL_COMMUNITY)
Admission: RE | Admit: 2023-02-04 | Discharge: 2023-02-04 | Disposition: A | Discharge status: Home / Self Care | Payer: MEDICARE | Source: Ambulatory Visit | Attending: Cardiology | Admitting: Cardiology

## 2023-02-04 DIAGNOSIS — Z952 Presence of prosthetic heart valve: Secondary | ICD-10-CM | POA: Diagnosis not present

## 2023-02-06 ENCOUNTER — Encounter (HOSPITAL_COMMUNITY)
Admission: RE | Admit: 2023-02-06 | Discharge: 2023-02-06 | Disposition: A | Discharge status: Home / Self Care | Payer: MEDICARE | Source: Ambulatory Visit | Attending: Cardiology | Admitting: Cardiology

## 2023-02-06 DIAGNOSIS — Z952 Presence of prosthetic heart valve: Secondary | ICD-10-CM

## 2023-02-07 ENCOUNTER — Other Ambulatory Visit: Payer: Self-pay | Admitting: Internal Medicine

## 2023-02-09 ENCOUNTER — Encounter (HOSPITAL_COMMUNITY)
Admission: RE | Admit: 2023-02-09 | Discharge: 2023-02-09 | Disposition: A | Discharge status: Home / Self Care | Payer: MEDICARE | Source: Ambulatory Visit | Attending: Cardiology | Admitting: Cardiology

## 2023-02-09 DIAGNOSIS — Z952 Presence of prosthetic heart valve: Secondary | ICD-10-CM

## 2023-02-11 ENCOUNTER — Encounter (HOSPITAL_COMMUNITY): Admission: RE | Admit: 2023-02-11 | Payer: MEDICARE | Source: Ambulatory Visit

## 2023-02-11 DIAGNOSIS — Z952 Presence of prosthetic heart valve: Secondary | ICD-10-CM | POA: Diagnosis not present

## 2023-02-12 DIAGNOSIS — H34831 Tributary (branch) retinal vein occlusion, right eye, with macular edema: Secondary | ICD-10-CM | POA: Diagnosis not present

## 2023-02-12 NOTE — Progress Notes (Signed)
[facility]   MULTIDISCIPLINARY HEART VALVE CLINIC                                     Cardiology Office Note:    Date:  02/17/2023   ID:  Robert [name], DOB 28-Jul-1950, MRN [id number]  PCP:  Robert Garter, MD  [facility] Cardiologist:  Robert Red, MD / Dr. Lynnette [name] & Dr. Leafy [name] (TAVR)  [facility] Electrophysiologist:  None   Referring MD: Robert Garter, MD   Chief Complaint  Patient presents with   Follow-up    1 month s/p TAVR   History of Present Illness:    Robert [name] is a 72 y.o. male with a hx of DVT/PE, persistent AF on Coumadin (pt prefers this due to availability of reversal agent), GERD, morbid obesity (BMI 41), post COVID syndrome and severe AS s/p TAVR (01/06/23) who presents to clinic for one month TAVR follow up.   Robert [name] was seen by Dr. Cristal [name] for worsening shortness of breath since a Covid infection in 06/2022. Echo 11/13/22 showed EF 55%, severe AS with mean grad 40 mmHg, peak grad 58.4 mmHg, AVA 0.65 cm2, DVI 0.21, SVI 23. PET stress test 11/26/22 showed no evidence of ischemia however the test was thought to be intermediate risk given resting abnormal EF, decreased with stress, and severe coronary artery calcification of the LAD and right coronary artery. [facility] 12/16/22 showed mild non obstructive CAD with elevated filling pressures. Lasix was increased.     The patient was evaluated by the multidisciplinary valve team and underwent successful TAVR with a 34 mm Medtronic FX THV via the TF approach on 01/06/23. Post operative echo showed EF 50%, normally functioning TAVR with a mean gradient of 7 mmHg and no PVL, valve noted to be placed somewhat deep. He was resumed on home Coumadin.    When called after his surgery he complained of bilateral ankle pain similar to previous gout flares. He started colchicine with improvement. He was then seen in close follow up and reported more fatigue with worsening LE  edema. His Lasix was adjusted. EKG reviewed today from that visit which showed AF however with new LBBB. He retutns today for one month follow up with his wife.   He continues to have profound fatigue since he was last seen which appears to be his most concerning issues. He was initially feeling well after TAVR however has since felt that he has regressed with his energy. His wife will no longer allow him to drive after CRII due to his fatigue. He has lost approximately 14lbs since last OV and does not appear volume overloaded on exam. HR is elevated today but he feels this is his baseline. He denies chest pain, SOB, palpitations, LE edema, orthopnea, PND, dizziness, or syncope. Denies bleeding in stool or urine.    Past Medical History:  Diagnosis Date   Anxiety    Asthma    "no attack since acupuncture in 1990's"   Atrial fibrillation (HCC)    Complication of anesthesia    SLOW TO WAKE UP / DIFFICULTY RESPONDING 2003, 3 days before could use left arm, "wild/crazy sometimes"   COPD (chronic obstructive pulmonary disease) (HCC)    Cyst of left kidney    "benign"   DVT (deep venous thrombosis) (HCC) 06/23/2001   right femoral artery "from groin to knee"   Edema  Right Leg w/ h/o DVT postphlebitic   H/O blood transfusion reaction 06/23/2002   FFP, extreme swelling, could not breath   History of pulmonary embolism 06/23/2001   both lungs   Hyperlipidemia    Knee pain    left   LBP (low back pain)    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 Lynnette [name] and Dr. Leafy [name]   Severe aortic stenosis    Spondylolisthesis    Warfarin anticoagulation    Wheezing    when laying on left side    Past Surgical History:  Procedure Laterality Date   APPENDECTOMY   06/24/1991   Dr Robert [name]   CATARACT EXTRACTION W/ INTRAOCULAR LENS IMPLANT Bilateral    May/june 2024   CHOLECYSTECTOMY  06/24/1991   FINGER SURGERY  06/23/2010   RT THUMB RECONSTRUCTION   HAND SURGERY Right    abcess   HEMORROIDECTOMY  06/24/1979   I & D KNEE WITH POLY EXCHANGE Left 04/04/2013   Procedure: IRRIGATION AND DEBRIDEMENT KNEE WITH POLY EXCHANGE AND INSERTION OF CEMENT BEADS;  Surgeon: Robert Drilling, MD;  Location: [facility];  Service: Orthopedics;  Laterality: Left;   INTRAOPERATIVE TRANSTHORACIC ECHOCARDIOGRAM N/A 01/06/2023   Procedure: INTRAOPERATIVE TRANSTHORACIC ECHOCARDIOGRAM;  Surgeon: Robert Pyo, MD;  Location: [facility];  Service: Open Heart Surgery;  Laterality: N/A;   KNEE ARTHROSCOPY  06/23/1988   RT KNEE   KNEE ARTHROSCOPY Right 06/23/2001   KNEE ARTHROSCOPY  06/23/1986   L KNEE   KNEE ARTHROSCOPY WITH MENISCAL REPAIR Left 10/22/2012   Procedure: LEFT KNEE ARTHROSCOPY PARTIAL MEDIAL AND LATERAL MENISCECTOMY AND DEBRIDEMENT, CHONDROPLASTY ;  Surgeon: Robert Docker, MD;  Location: [facility];  Service: Orthopedics;  Laterality: Left;   LUMBAR DISC SURGERY     LUMBAR FUSION  06/23/2001   Dr Robert [name]   ORCHIECTOMY  06/23/1992   R post injury   RIGHT HEART CATH AND CORONARY ANGIOGRAPHY N/A 12/16/2022   Procedure: RIGHT HEART CATH AND CORONARY ANGIOGRAPHY;  Surgeon: Robert Pyo, MD;  Location: [facility];  Service: Cardiovascular;  Laterality: N/A;   TONSILLECTOMY  06/23/1965   TOTAL KNEE ARTHROPLASTY Left 03/21/2013   Procedure: LEFT TOTAL KNEE ARTHROPLASTY;  Surgeon: Robert Drilling, MD;  Location: [facility];  Service: Orthopedics;  Laterality: Left;   TRANSCATHETER AORTIC VALVE REPLACEMENT, TRANSFEMORAL Right 01/06/2023   Procedure: Transcatheter Aortic Valve Replacement, Transfemoral;  Surgeon: Robert Pyo, MD;  Location: [facility];  Service: Open Heart Surgery;  Laterality: Right;    Current Medications: Current Meds   Medication Sig   albuterol (VENTOLIN HFA) 108 (90 Base) MCG/ACT inhaler Inhale 2 puffs into the lungs every 6 (six) hours as needed for wheezing or shortness of breath.   allopurinol (ZYLOPRIM) 100 MG tablet Take 1 tablet (100 mg total) by mouth daily.   celecoxib (CELEBREX) 200 MG capsule Take 1 capsule (200 mg total) by mouth 2 (two) times daily. (Patient taking differently: Take 200 mg by mouth 2 (two) times daily as needed for moderate pain.)   cetirizine (ZYRTEC) 10 MG tablet Take 1 tablet by mouth once daily (Patient taking differently: Take 10 mg by mouth at bedtime.)  Cholecalciferol (VITAMIN D3) 125 MCG (5000 UT) CAPS Take 1 capsule (5,000 Units total) by mouth daily.   colchicine 0.6 MG tablet Take two tablets prn gout attack.Then take another one in 1-2 hrs. Do not repeat for 3 days.   doxycycline (MONODOX) 100 MG capsule Take 1 capsule (100 mg total) by mouth as directed. 1 hour before any dental work, including cleanings.   famotidine (PEPCID) 40 MG tablet Take 1 tablet by mouth once daily (Patient taking differently: Take 40 mg by mouth daily as needed for heartburn or indigestion.)   faricimab-svoa (VABYSMO) 6 MG/0.05ML SOLN intravitreal injection 6 mg by Intravitreal route once. Injections every 8 weeks   fish oil-omega-3 fatty acids 1000 MG capsule Take 1 g by mouth every morning.   furosemide (LASIX) 20 MG tablet Take 20 mg by mouth 2 (two) times daily.   glucosamine-chondroitin 500-400 MG tablet Take 1 tablet by mouth every morning.   mupirocin ointment (BACTROBAN) 2 % Place 1 Application into the nose 2 (two) times daily.   nortriptyline (PAMELOR) 25 MG capsule TAKE 1 CAPSULE AT BEDTIME   potassium chloride (KLOR-CON) 10 MEQ tablet START POTASSIUM 10 MG TWICE DAILY FOR 3 DAYS, THEN DECREASE TO 10 MG DAILY.   predniSONE (DELTASONE) 5 MG tablet TAKE 1 TABLET DAILY WITH BREAKFAST   pregabalin (LYRICA) 75 MG capsule Take 1 capsule by mouth three times daily as needed    triamcinolone cream (KENALOG) 0.1 % Apply 1 Application topically 3 (three) times daily. (Patient taking differently: Apply 1 Application topically daily as needed (irritation).)   warfarin (COUMADIN) 3 MG tablet TAKE 1 TABLET DAILY OR TAKE AS DIRECTED BY ANTICOAGULATION CLINIC (Patient taking differently: Take 3 mg by mouth See admin instructions. TAKE 1 TABLET DAILY OR TAKE AS DIRECTED BY ANTICOAGULATION CLINIC; 6 mg (6 mg x 1) every Fri; 3 mg (6 mg x 0.5) all other days)   warfarin (COUMADIN) 6 MG tablet TAKE 1/2 TABLET BY MOUTH DAILY EXCEPT TAKE 1 TABLET ON FRIDAYS OR AS DIRECTED BY ANTICOAGULATION CLINIC (Patient taking differently: Take 6 mg by mouth See admin instructions. TAKE 1/2 TABLET BY MOUTH DAILY EXCEPT TAKE 1 TABLET ON FRIDAYS OR AS DIRECTED BY ANTICOAGULATION CLINIC; 6 mg (6 mg x 1) every Fri; 3 mg (6 mg x 0.5) all other days)   [DISCONTINUED] furosemide (LASIX) 20 MG tablet TAKE LASIX 40 MG TWICE DAILY FOR THREE DAYS, THEN DECREASE TO 40 MG IN THE AM AND 20 MG IN THE PM BY MOUTH     Allergies:   Cardizem [diltiazem], Lopressor [metoprolol tartrate], Sulfa antibiotics, Amiodarone, Anoro ellipta [umeclidinium-vilanterol], Breo ellipta [fluticasone furoate-vilanterol], Exalgo [hydromorphone hcl], Gabapentin, Hydrocodone-acetaminophen, Penicillins, Sulfonamide derivatives, Tylenol [acetaminophen], and Cat hair extract   Social History   Socioeconomic History   Marital status: Married    Spouse name: Not on file   Number of children: 3   Years of education: Not on file   Highest education level: Bachelor's degree (e.g., BA, AB, BS)  Occupational History   Occupation: Disabled    Employer: DISABLED  Tobacco Use   Smoking status: Never   Smokeless tobacco: Never  Vaping Use   Vaping status: Never Used  Substance and Sexual Activity   Alcohol use: Never   Drug use: Never   Sexual activity: Yes  Other Topics Concern   Not on file  Social History Narrative   Right handed   One  story home   Drinks caffeine coffee q am   Social Determinants of  Health   Financial Resource Strain: Patient Declined (01/27/2023)   Overall Financial Resource Strain (CARDIA)    Difficulty of Paying Living Expenses: Patient declined  Food Insecurity: Patient Declined (01/27/2023)   Hunger Vital Sign    Worried About Running Out of Food in the Last Year: Patient declined    Ran Out of Food in the Last Year: Patient declined  Transportation Needs: No Transportation Needs (01/27/2023)   PRAPARE - Administrator, Civil Service (Medical): No    Lack of Transportation (Non-Medical): No  Physical Activity: Patient Declined (01/27/2023)   Exercise Vital Sign    Days of Exercise per Week: Patient declined    Minutes of Exercise per Session: Patient declined  Stress: No Stress Concern Present (01/27/2023)   [employer] of Occupational Health - Occupational Stress Questionnaire    Feeling of Stress : Only a little  Social Connections: Unknown (01/27/2023)   Social Connection and Isolation Panel [NHANES]    Frequency of Communication with Friends and Family: Once a week    Frequency of Social Gatherings with Friends and Family: Once a week    Attends Religious Services: Patient declined    Database administrator or Organizations: Yes    Attends Banker Meetings: Patient declined    Marital Status: Married    Family History: The patient's family history includes Heart disease in his mother and sister; Hyperlipidemia in an other family member; Hypertension in an other family member; Parkinsonism in an other family member.  ROS:   Please see the history of present illness.    All other systems reviewed and are negative.  EKGs/Labs/Other Studies Reviewed:    The following studies were reviewed today:  Cardiac Studies & Procedures   CARDIAC CATHETERIZATION  CARDIAC CATHETERIZATION 12/16/2022  Narrative   Ost RCA to Prox RCA lesion is 20% stenosed.   Mid LAD lesion  is 40% stenosed.  1.  Mild obstructive coronary artery disease. 2.  Fick cardiac output of 7.5 L/min and Fick cardiac index of 2.9 L/min/m with the following hemodynamics: Right atrial pressure mean of 15 mmHg Right ventricular pressure 47/8 with end-diastolic pressure of 15 mmHg Wedge pressure mean 23 mmHg PA pressure 46/23 with a mean of 34 mmHg.  Recommendation: Continue evaluation for aortic valve intervention; increase Lasix to 20 mg twice daily and restart Coumadin tonight.  Findings Coronary Findings Diagnostic  Dominance: Right  Left Anterior Descending There is mild diffuse disease throughout the vessel. Mid LAD lesion is 40% stenosed.  Left Circumflex The vessel exhibits minimal luminal irregularities.  Right Coronary Artery The vessel exhibits minimal luminal irregularities. Ost RCA to Prox RCA lesion is 20% stenosed.  Intervention  No interventions have been documented.   STRESS TESTS  NM PET CT CARDIAC PERFUSION MULTI W/ABSOLUTE BLOODFLOW 11/26/2022  Narrative   LV perfusion is normal. There is no evidence of ischemia. There is no evidence of infarction.   Rest left ventricular function is abnormal. Rest global function is moderately reduced. There were no regional wall motion abnormalities. Rest EF: 40 %. Stress left ventricular function is abnormal. Stress global function is moderately reduced. There were no regional wall abnormalities. Stress EF: 33 %. End diastolic cavity size is mildly enlarged. End systolic cavity size is mildly enlarged.   Myocardial blood flow was computed to be 0.80ml/g/min at rest and 1.32ml/g/min at stress. Global myocardial blood flow reserve was 1.92 and was mildly abnormal.   Coronary calcium was present on the attenuation  correction CT images. Severe coronary calcifications were present. Coronary calcifications were present in the left anterior descending artery and right coronary artery distribution(s).   Findings are equivocal. The  study is intermediate risk.   Despite normal perfusion study considered intermediate to high risk. Resting EF abnormal and decreases with stress. TID 1.3 with abnormal MBF and severe calcium in LAD and RCA Baseline heart rhythm atrial fibrillation  CLINICAL DATA:  This over-read does not include interpretation of cardiac or coronary anatomy or pathology. The Cardiac PET CT interpretation by the cardiologist is attached.  COMPARISON:  None Available.  FINDINGS: Vascular: No acute abnormality.  Mediastinum/Nodes: Decreased AP diameter of the trachea and bilateral mainstem bronchi which may be seen with chronic tracheobronchomalacia. Lymph node posterior to the right superior pulmonary vein measures 1.5 cm, image 44/3  Lungs/Pleura: No pleural effusion, airspace consolidation or pneumothorax. Bilateral peripheral predominant interstitial reticulation and subpleural banding identified. No frank honeycombing or bronchiectasis identified. No suspicious pulmonary nodule or mass.  Upper Abdomen: Small cyst in left lobe of liver measures 1 cm. No acute abnormality.  Musculoskeletal: Spondylosis identified within the thoracic spine. No acute or suspicious findings.  IMPRESSION: 1. Upper limits of normal left posterior mediastinal lymph node measures 1.5 cm. 2. Decreased AP diameter of the trachea and bilateral mainstem bronchi which may be seen with chronic tracheobronchomalacia. 3. Chronic postinflammatory changes noted within both lungs including subpleural banding and interstitial reticulation.   Electronically Signed By: Signa Kell M.D. On: 11/26/2022 08:41   ECHOCARDIOGRAM  ECHOCARDIOGRAM COMPLETE 02/16/2023  Narrative ECHOCARDIOGRAM REPORT    Patient Name:   LENDALL CLEMMER Date of Exam: 02/16/2023 Medical Rec #:  [id number]         Height:       71.0 in Accession #:    [id number]        Weight:       314.0 lb Date of Birth:  [birth year]         BSA:          2.554  m Patient Age:    72 years          BP:           118/70 mmHg Patient Gender: M                 HR:           105 bpm. Exam Location:  [address]  Procedure: 2D Echo, Cardiac Doppler and Color Doppler  Indications:    1 month S/p TAVR Z95.2  History:        Patient has prior history of Echocardiogram examinations, most recent 01/07/2023. COPD, Arrythmias:Atrial Fibrillation; Risk Factors:Dyslipidemia. Aortic Valve: 34 Medtronic CoreValve-Evolut Pro prosthetic, stented (TAVR) valve is present in the aortic position. Procedure Date: 01/06/2023.  Sonographer:    Thurman Coyer RDCS Referring Phys: [phone] Eilene Voigt D Akshara Blumenthal  IMPRESSIONS   1. Left ventricular ejection fraction, by estimation, is 30 to 35%. The left ventricle has moderately decreased function. The left ventricle demonstrates global hypokinesis. Left ventricular diastolic function could not be evaluated. There is incordinate septal motion. 2. Right ventricular systolic function is mildly reduced. The right ventricular size is normal. There is normal pulmonary artery systolic pressure. The estimated right ventricular systolic pressure is 34.6 mmHg. 3. The mitral valve is abnormal. Mild mitral valve regurgitation. 4. The aortic valve has been repaired/replaced. Aortic valve regurgitation is not visualized. There is a 34 Medtronic CoreValve-Evolut Pro prosthetic (TAVR) valve present  in the aortic position. Procedure Date: 01/06/2023. Echo findings are consistent with normal structure and function of the aortic valve prosthesis. Aortic valve area, by VTI measures 2.23 cm. Aortic valve mean gradient measures 9.2 mmHg. Aortic valve Vmax measures 2.09 m/s. 5. The inferior vena cava is normal in size with greater than 50% respiratory variability, suggesting right atrial pressure of 3 mmHg. 6. Rhythm strip during this exam demonstrates atrial fibrillation.  Comparison(s): Changes from prior study are noted. 01/07/2023: LVEF 50-55%,  TAVR mean gradient 7 mmHg, no PVL. Compared to the prior study, there has been a decline in LV systolic function.  FINDINGS Left Ventricle: Left ventricular ejection fraction, by estimation, is 30 to 35%. The left ventricle has moderately decreased function. The left ventricle demonstrates global hypokinesis. The left ventricular internal cavity size was normal in size. There is no left ventricular hypertrophy. Incordinate septal motion. Left ventricular diastolic function could not be evaluated due to atrial fibrillation. Left ventricular diastolic function could not be evaluated.  Right Ventricle: The right ventricular size is normal. No increase in right ventricular wall thickness. Right ventricular systolic function is mildly reduced. There is normal pulmonary artery systolic pressure. The tricuspid regurgitant velocity is 2.81 m/s, and with an assumed right atrial pressure of 3 mmHg, the estimated right ventricular systolic pressure is 34.6 mmHg.  Left Atrium: Left atrial size was normal in size.  Right Atrium: Right atrial size was normal in size.  Pericardium: There is no evidence of pericardial effusion.  Mitral Valve: The mitral valve is abnormal. Mild mitral annular calcification. Mild mitral valve regurgitation.  Tricuspid Valve: The tricuspid valve is grossly normal. Tricuspid valve regurgitation is trivial.  Aortic Valve: The aortic valve has been repaired/replaced. Aortic valve regurgitation is not visualized. Aortic valve mean gradient measures 9.2 mmHg. Aortic valve peak gradient measures 17.5 mmHg. Aortic valve area, by VTI measures 2.23 cm. There is a 34 Medtronic CoreValve-Evolut Pro prosthetic, stented (TAVR) valve present in the aortic position. Procedure Date: 01/06/2023. Echo findings are consistent with normal structure and function of the aortic valve prosthesis.  Pulmonic Valve: The pulmonic valve was normal in structure. Pulmonic valve regurgitation is not  visualized.  Aorta: The aortic root and ascending aorta are structurally normal, with no evidence of dilitation.  Venous: The inferior vena cava is normal in size with greater than 50% respiratory variability, suggesting right atrial pressure of 3 mmHg.  IAS/Shunts: No atrial level shunt detected by color flow Doppler.  EKG: Rhythm strip during this exam demonstrates atrial fibrillation.   LEFT VENTRICLE PLAX 2D LVIDd:         5.50 cm LVIDs:         4.60 cm LV PW:         0.90 cm LV IVS:        0.90 cm LVOT diam:     2.60 cm LV SV:         79 LV SV Index:   31 LVOT Area:     5.31 cm  LV Volumes (MOD) LV vol d, MOD A2C: 105.0 ml LV vol d, MOD A4C: 136.0 ml LV vol s, MOD A2C: 63.6 ml LV vol s, MOD A4C: 80.9 ml LV SV MOD A2C:     41.4 ml LV SV MOD A4C:     136.0 ml LV SV MOD BP:      50.2 ml  RIGHT VENTRICLE            IVC RV Basal diam:  3.70 cm    IVC diam: 1.50 cm RV Mid diam:    2.80 cm RV S prime:     8.35 cm/s  LEFT ATRIUM             Index        RIGHT ATRIUM           Index LA diam:        4.80 cm 1.88 cm/m   RA Area:     23.80 cm LA Vol (A2C):   82.8 ml 32.42 ml/m  RA Volume:   63.50 ml  24.86 ml/m LA Vol (A4C):   82.3 ml 32.22 ml/m LA Biplane Vol: 83.7 ml 32.77 ml/m AORTIC VALVE AV Area (Vmax):    2.39 cm AV Area (Vmean):   2.35 cm AV Area (VTI):     2.23 cm AV Vmax:           209.40 cm/s AV Vmean:          139.400 cm/s AV VTI:            0.352 m AV Peak Grad:      17.5 mmHg AV Mean Grad:      9.2 mmHg LVOT Vmax:         94.27 cm/s LVOT Vmean:        61.633 cm/s LVOT VTI:          0.148 m LVOT/AV VTI ratio: 0.42  AORTA Ao Root diam: 3.60 cm Ao Asc diam:  3.70 cm  TRICUSPID VALVE TR Peak grad:   31.6 mmHg TR Vmax:        281.00 cm/s  SHUNTS Systemic VTI:  0.15 m Systemic Diam: 2.60 cm  Zoila Shutter MD Electronically signed by Zoila Shutter MD Signature Date/Time: 02/16/2023/10:53:42 AM    Final    MONITORS  LONG TERM MONITOR  (3-14 DAYS) 05/06/2018  Narrative Patient had a min HR of 53 bpm, max HR of 214 bpm, and avg HR of 89 bpm. Predominant underlying rhythm was Sinus Rhythm. There were 9 patient triggered events, and Supraventricular Tachycardia was detected within +/- 45 seconds of symptomatic patient event(s).  Monitor noted 6 episodes of wide complex tachycardia; these are labeled as VT but are different morphology from the majority of his PVCs and bigeminy/trigeminy. Could be VT vs. SVT with aberrancy, given that these events occurred at a faster heart rate than his predominant SVT. The fastest WCT lasted 15 beats with a max rate of 197 bpm (avg 164 bpm); the run with the fastest interval was also the longest.  4752 narrow complex Supraventricular Tachycardia runs occurred, the run with the fastest interval lasting 1 min 37 secs with a max rate of 214 bpm, the longest lasting 56 mins 49 secs with an avg rate of 126 bpm. Some episodes of Supraventricular Tachycardia conducted with possible aberrancy. Isolated SVEs were frequent (10.9%, [id number]), SVE Couplets were rare (<1.0%, 7844), and SVE Triplets were rare (<1.0%, 793). Isolated VEs were occasional (2.1%, [id number]), VE Couplets were rare (<1.0%, 338), and VE Triplets were rare (<1.0%, 5). Ventricular Bigeminy and Trigeminy were present.   CT SCANS  CT CORONARY MORPH W/CTA COR W/SCORE 12/22/2022  Addendum 12/23/2022  7:12 AM ADDENDUM REPORT: 12/23/2022 07:10  CLINICAL DATA:  Aortic valve replacement (TAVR), pre-op eval  EXAM: Cardiac TAVR CT  TECHNIQUE: The patient was scanned on a Siemens Force 192 slice scanner. A 120 kV retrospective scan was triggered in the descending thoracic aorta at 111 HU's.  Gantry rotation speed was 270 msecs and collimation was .9 mm. The 3D data set was reconstructed in 5% intervals of the R-R cycle. Systolic and diastolic phases were analyzed on a dedicated work station using MPR, MIP and VRT modes. The patient received  OMNIPAQUE IOHEXOL 350 MG/ML SOLN of contrast.  FINDINGS: Image quality: Average, reduced CNR.  Reduced SNR.  Aortic Valve:  Tricuspid aortic valve with severely reduced cusp excursion. Severely thickened and severely calcified aortic valve cusps.  AV calcium score: 2591  Virtual Basal Annulus Measurements:  Maximum/Minimum Diameter: 29.8 x 23.2 mm  Perimeter:  85 mm  Area:  552 mm2  No significant LVOT calcifications.  Membranous septal length: 6.7 mm  Based on these measurements, the annulus would be suitable for a 29 mm Sapien 3 valve. Alternatively, Heart Team can consider 34 mm Evolut valve. Recommend Heart Team discussion for valve selection.  Sinus of Valsalva Measurements:  Non-coronary:  37 mm  Right - coronary:  36 mm  Left - coronary:  36 mm  Sinus of Valsalva Height:  Left: 27.3 mm  Right: 25.3 mm  Aorta: Short segment common origin of brachiocephalic and left common carotid arteries off of aortic arch.  Sinotubular Junction:  31 mm  Ascending Thoracic Aorta:  36 mm  Aortic Arch:  31 mm  Descending Thoracic Aorta:  27 mm  Coronary Artery Height above Annulus:  Left main: 16.8 mm  Right coronary: 19.3 mm  Coronary Arteries: Normal coronary origin. Right dominance. The study was performed without use of NTG and insufficient for plaque evaluation. Coronary artery calcium seen in 3 vessel distribution.  Optimum Fluoroscopic Angle for Delivery:  LAO 3 CAU 3  OTHER:  Atria:  Left atrial appendage: No thrombus.  Mitral valve: Grossly normal, mild mitral annular calcifications.  Pulmonary artery: Normal caliber.  Pulmonary veins: Normal anatomy.  IMPRESSION: 1. 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 Severely thickened and severely calcified aortic valve cusps. 2. Aortic valve calcium score: 2591 3. Annulus area: 552 mm2, suitable for 29 mm Sapien 3 valve. No LVOT calcifications. Membranous septal length 6.7  mm. 4. Sufficient coronary artery heights from annulus. 5. Optimum fluoroscopic angle for delivery: LAO 3 CAU 3   Electronically Signed By: Weston Brass M.D. On: 12/23/2022 07:10  Narrative EXAM: OVER-READ INTERPRETATION  CT CHEST  The following report is a limited chest CT over-read performed by radiologist Dr. Allegra Lai of [facility] on 12/22/2022. This over-read does not include interpretation of cardiac or coronary anatomy or pathology. The cardiac TAVR interpretation by the cardiologist is attached.  COMPARISON:  None Available.  FINDINGS: Extracardiac findings will be described separately under dictation for contemporaneously obtained CTA chest, abdomen and pelvis.  IMPRESSION: Please see separate dictation for contemporaneously obtained CTA chest, abdomen and pelvis dated 12/22/2022 for full description of relevant extracardiac findings.  Electronically Signed: By: Allegra Lai M.D. On: 12/22/2022 12:56          EKG:  EKG is not ordered today.    Recent Labs: 03/17/2022: TSH 4.49 01/02/2023: ALT 23 01/07/2023: Magnesium 1.7 02/16/2023: BUN 14; Creatinine, Ser 1.18; Hemoglobin 16.7; NT-Pro BNP 827; Platelets 256; Potassium 4.4; Sodium 139   Recent Lipid Panel    Component Value Date/Time   CHOL 191 03/17/2022 0927   TRIG 168.0 (H) 03/17/2022 0927   TRIG 118 04/30/2006 0841   HDL 36.20 (L) 03/17/2022 0927   CHOLHDL 5 03/17/2022 0927   VLDL 33.6 03/17/2022 1610  LDLCALC 121 (H) 03/17/2022 0927   LDLDIRECT 137.0 03/10/2017 0842   Physical Exam:    VS:  BP 120/74   Pulse (!) 102   Ht 5\' 11"  (1.803 m)   Wt (!) 300 lb 9.6 oz (136.4 kg)   SpO2 96%   BMI 41.93 kg/m     Wt Readings from Last 3 Encounters:  02/16/23 (!) 300 lb 9.6 oz (136.4 kg)  01/27/23 (!) 314 lb (142.4 kg)  01/22/23 (!) 310 lb 13.6 oz (141 kg)    General: Well developed, well nourished, NAD Neck: Negative for carotid bruits. No JVD Lungs:Clear to  ausculation bilaterally. No wheezes, rales, or rhonchi. Breathing is unlabored. Cardiovascular: Irregularly irregular. No murmurs Extremities: No edema.  Neuro: Alert and oriented. No focal deficits. No facial asymmetry. MAE spontaneously. Psych: Responds to questions appropriately with normal affect.    ASSESSMENT/PLAN:    Severe AS s/p TAVR with new cardiomyopathy: Patient with NYHA class II/III symptoms one month out from TAVR. Prior echo with stable valve function, EF, and gradients however echo today with EF down to 30-35% with global hypokinesis and dysynchrony in septal motion, mild MR, and stable Medtronic 34mm valve function with mean gradient at 9.24mmHg, peak 17.38mmHg and AVA at 2.23cm2. EKG at last evaluation with new LBBB felt to be the likely etiology of new cardiomyopathy. Pre TAVR cath eith  mild non-obstructive CAD. He does have AF with inconsistent HR's with known allergies to beta blockers and ca+ channel blockers. He has been reluctant to undergo DCCV. Low suspicion AF is the etiology. Will attempt Entresto 24-26mg  although will not send in until personal discussion with the patient. Patient to see Dr. Cristal [name] 9/13. Will need to repeat BMET at that time. Given multiple medication intolerances and allergies, patient may not tolerate GMT attempts. Could consider EP referral in the long term for consideration of CRT therapy. Continue Coumadin monotherapy and lifelong doxy for dental SBE. Plan interm follow up as above and our team for one year OV with echo.   Chronic combined CHF: Appear euvolemic on exam today with weight down to 300lb (from 314lb). Continue current Lasix regimen. See plan above.    Persistent atrial fibrillation: Remains in AF with HR's in the 90-100's.  Per review of Dr. Di Kindle last note, he cannot tolerate diltiazem or beta blockers and has declined cardioversion in the past. Previous discussion about restoring to NSR however patient has deferred. As above,  could consider EP referral for AF and possible CRT on the future. Continue coumadin. INRs followed by PCP.   HTN: BP well controlled. No changes today.    Renal lesion: Pre TAVR CTs showed "indeterminate 3.5 cm lesion of the upper pole of the left kidney. Recommend further evaluation contrast-enhanced abdominal MRI." Ordered at [facility] which showed renal cysts with no further follow up needed.   Medication Adjustments/Labs and Tests Ordered: Current medicines are reviewed at length with the patient today.  Concerns regarding medicines are outlined above.  Orders Placed This Encounter  Procedures   Basic metabolic panel   CBC   Pro b natriuretic peptide (BNP)   ECHOCARDIOGRAM COMPLETE   No orders of the defined types were placed in this encounter.   Patient Instructions  Medication Instructions:  Decrease Lasix to 20 mg in the morning and 20 mg in the evening   *If you need a refill on your cardiac medications before your next appointment, please call your pharmacy*   Lab Work: PRO BNP, BMP, CBC -  TODAY   If you have labs (blood work) drawn today and your tests are completely normal, you will receive your results only by: MyChart Message (if you have MyChart) OR A paper copy in the mail If you have any lab test that is abnormal or we need to change your treatment, we will call you to review the results.   Testing/Procedures: None ordered    Follow-Up: Follow up as scheduled   Other Instructions     Signed, Georgie Chard, NP  02/17/2023 7:52 AM    [facility]

## 2023-02-13 ENCOUNTER — Encounter (HOSPITAL_COMMUNITY): Payer: MEDICARE

## 2023-02-16 ENCOUNTER — Telehealth: Payer: Self-pay | Admitting: Cardiology

## 2023-02-16 ENCOUNTER — Ambulatory Visit (INDEPENDENT_AMBULATORY_CARE_PROVIDER_SITE_OTHER): Payer: MEDICARE | Admitting: Cardiology

## 2023-02-16 ENCOUNTER — Ambulatory Visit (HOSPITAL_COMMUNITY): Payer: MEDICARE | Attending: Internal Medicine

## 2023-02-16 ENCOUNTER — Encounter (HOSPITAL_COMMUNITY): Admission: RE | Admit: 2023-02-16 | Payer: MEDICARE | Source: Ambulatory Visit

## 2023-02-16 ENCOUNTER — Other Ambulatory Visit: Payer: Self-pay | Admitting: Cardiology

## 2023-02-16 VITALS — BP 120/74 | HR 102 | Ht 71.0 in | Wt 300.6 lb

## 2023-02-16 DIAGNOSIS — N289 Disorder of kidney and ureter, unspecified: Secondary | ICD-10-CM

## 2023-02-16 DIAGNOSIS — Z952 Presence of prosthetic heart valve: Secondary | ICD-10-CM | POA: Diagnosis not present

## 2023-02-16 DIAGNOSIS — R5383 Other fatigue: Secondary | ICD-10-CM | POA: Insufficient documentation

## 2023-02-16 DIAGNOSIS — R0609 Other forms of dyspnea: Secondary | ICD-10-CM

## 2023-02-16 DIAGNOSIS — I4821 Permanent atrial fibrillation: Secondary | ICD-10-CM

## 2023-02-16 DIAGNOSIS — I10 Essential (primary) hypertension: Secondary | ICD-10-CM | POA: Insufficient documentation

## 2023-02-16 DIAGNOSIS — I350 Nonrheumatic aortic (valve) stenosis: Secondary | ICD-10-CM | POA: Insufficient documentation

## 2023-02-16 LAB — ECHOCARDIOGRAM COMPLETE
AR max vel: 2.39 cm2
AV Area VTI: 2.23 cm2
AV Area mean vel: 2.35 cm2
AV Mean grad: 9.2 mmHg
AV Peak grad: 17.5 mmHg
Ao pk vel: 2.09 m/s
Calc EF: 41.2 %
S' Lateral: 4.6 cm
Single Plane A2C EF: 39.4 %
Single Plane A4C EF: 40.5 %

## 2023-02-16 NOTE — Patient Instructions (Signed)
Medication Instructions:  Decrease Lasix to 20 mg in the morning and 20 mg in the evening   *If you need a refill on your cardiac medications before your next appointment, please call your pharmacy*   Lab Work: PRO BNP, BMP, CBC - TODAY   If you have labs (blood work) drawn today and your tests are completely normal, you will receive your results only by: MyChart Message (if you have MyChart) OR A paper copy in the mail If you have any lab test that is abnormal or we need to change your treatment, we will call you to review the results.   Testing/Procedures: None ordered    Follow-Up: Follow up as scheduled   Other Instructions

## 2023-02-16 NOTE — Telephone Encounter (Signed)
Patient returned NP's call.

## 2023-02-16 NOTE — Telephone Encounter (Signed)
Patient seen for 1 month s/p TAVR follow up earlier today with complaints of worsening SOB and fatigue despite excellent volume status on exam. Echo reported newly reduced LVEF at 35% from prior post TAVR imaging. R/LHC with mild non-obstructive CAD. He does have AF with inconsistent HR's with known allergies to beta blockers and ca+ channel blockers. He has been reluctant to undergo DCCV. Low suspicion AF is the etiology. New LBBB on post TAVR follow up. Left message for call back regarding echo results. Will attempt Entresto 24-26mg  although will not send in until discussion with the patient. Patient to see Dr. Cristal Deer

## 2023-02-17 ENCOUNTER — Other Ambulatory Visit: Payer: Self-pay | Admitting: Internal Medicine

## 2023-02-17 LAB — BASIC METABOLIC PANEL
BUN/Creatinine Ratio: 12 (ref 10–24)
BUN: 14 mg/dL (ref 8–27)
CO2: 27 mmol/L (ref 20–29)
Calcium: 9.9 mg/dL (ref 8.6–10.2)
Chloride: 98 mmol/L (ref 96–106)
Creatinine, Ser: 1.18 mg/dL (ref 0.76–1.27)
Glucose: 111 mg/dL — ABNORMAL HIGH (ref 70–99)
Potassium: 4.4 mmol/L (ref 3.5–5.2)
Sodium: 139 mmol/L (ref 134–144)
eGFR: 66 mL/min/{1.73_m2} (ref >59)

## 2023-02-17 LAB — CBC
Hematocrit: 48.6 % (ref 37.5–51.0)
Hemoglobin: 16.7 g/dL (ref 13.0–17.7)
MCH: 33 pg (ref 26.6–33.0)
MCHC: 34.4 g/dL (ref 31.5–35.7)
MCV: 96 fL (ref 79–97)
Platelets: 256 10*3/uL (ref 150–450)
RBC: 5.06 x10E6/uL (ref 4.14–5.80)
RDW: 12.7 % (ref 11.6–15.4)
WBC: 10 10*3/uL (ref 3.4–10.8)

## 2023-02-17 LAB — PRO B NATRIURETIC PEPTIDE: NT-Pro BNP: 827 pg/mL — ABNORMAL HIGH (ref 0–376)

## 2023-02-17 MED ORDER — ENTRESTO 24-26 MG PO TABS: 1.0000 | ORAL_TABLET | Freq: Two times a day (BID) | ORAL | 2 refills | Status: DC

## 2023-02-17 NOTE — Progress Notes (Signed)
Cardiac Individual Treatment Plan  Patient Details  Name: EDEL KOVEN MRN: [id number] Date of Birth: [birth year] Referring Provider:   Flowsheet Row INTENSIVE CARDIAC REHAB ORIENT from 01/22/2023 in [facility] for Heart, Vascular, & Lung Health  Referring Provider Jodelle Red, MD       Initial Encounter Date:  Flowsheet Row INTENSIVE CARDIAC REHAB ORIENT from 01/22/2023 in [facility] for Heart, Vascular, & Lung Health  Date 01/22/23       Visit Diagnosis: 01/06/23 S/P TAVR (transcatheter aortic valve replacement)  Patient's Home Medications on Admission:  Current Outpatient Medications:    albuterol (VENTOLIN HFA) 108 (90 Base) MCG/ACT inhaler, Inhale 2 puffs into the lungs every 6 (six) hours as needed for wheezing or shortness of breath., Disp: 6.7 g, Rfl: 0   allopurinol (ZYLOPRIM) 100 MG tablet, Take 1 tablet (100 mg total) by mouth daily., Disp: 90 tablet, Rfl: 1   celecoxib (CELEBREX) 200 MG capsule, Take 1 capsule (200 mg total) by mouth 2 (two) times daily. (Patient taking differently: Take 200 mg by mouth 2 (two) times daily as needed for moderate pain.), Disp: 14 capsule, Rfl: 0   cetirizine (ZYRTEC) 10 MG tablet, Take 1 tablet by mouth once daily (Patient taking differently: Take 10 mg by mouth at bedtime.), Disp: 30 tablet, Rfl: 5   Cholecalciferol (VITAMIN D3) 125 MCG (5000 UT) CAPS, Take 1 capsule (5,000 Units total) by mouth daily., Disp: 100 capsule, Rfl: 3   colchicine 0.6 MG tablet, TAKE 2 TABLETS BY MOUTH AS NEEDED FOR  GOUT  ATTACK  THEN  TAKE  ANOTHER  ONE  TABLET  IN  1  TO  2  HOURS.  DO  NOT  REPEAT  FOR  3  DAYS., Disp: 18 tablet, Rfl: 0   doxycycline (MONODOX) 100 MG capsule, Take 1 capsule (100 mg total) by mouth as directed. 1 hour before any dental work, including cleanings., Disp: 6 capsule, Rfl: 6   famotidine (PEPCID) 40 MG tablet, Take 1 tablet by mouth once daily (Patient taking differently:  Take 40 mg by mouth daily as needed for heartburn or indigestion.), Disp: 30 tablet, Rfl: 5   faricimab-svoa (VABYSMO) 6 MG/0.05ML SOLN intravitreal injection, 6 mg by Intravitreal route once. Injections every 8 weeks, Disp: , Rfl:    fish oil-omega-3 fatty acids 1000 MG capsule, Take 1 g by mouth every morning., Disp: , Rfl:    furosemide (LASIX) 20 MG tablet, Take 20 mg by mouth 2 (two) times daily., Disp: , Rfl:    glucosamine-chondroitin 500-400 MG tablet, Take 1 tablet by mouth every morning., Disp: , Rfl:    mupirocin ointment (BACTROBAN) 2 %, Place 1 Application into the nose 2 (two) times daily., Disp: , Rfl:    nortriptyline (PAMELOR) 25 MG capsule, TAKE 1 CAPSULE AT BEDTIME, Disp: 90 capsule, Rfl: 3   potassium chloride (KLOR-CON) 10 MEQ tablet, START POTASSIUM 10 MG TWICE DAILY FOR 3 DAYS, THEN DECREASE TO 10 MG DAILY., Disp: 90 tablet, Rfl: 3   predniSONE (DELTASONE) 5 MG tablet, TAKE 1 TABLET DAILY WITH BREAKFAST, Disp: 90 tablet, Rfl: 3   pregabalin (LYRICA) 75 MG capsule, Take 1 capsule by mouth three times daily as needed, Disp: 90 capsule, Rfl: 1   sacubitril-valsartan (ENTRESTO) 24-26 MG, Take 1 tablet by mouth 2 (two) times daily., Disp: 60 tablet, Rfl: 2   triamcinolone cream (KENALOG) 0.1 %, Apply 1 Application topically 3 (three) times daily. (Patient taking differently: Apply  1 Application topically daily as needed (irritation).), Disp: 450 g, Rfl: 1   warfarin (COUMADIN) 3 MG tablet, TAKE 1 TABLET DAILY OR TAKE AS DIRECTED BY ANTICOAGULATION CLINIC (Patient taking differently: Take 3 mg by mouth See admin instructions. TAKE 1 TABLET DAILY OR TAKE AS DIRECTED BY ANTICOAGULATION CLINIC; 6 mg (6 mg x 1) every Fri; 3 mg (6 mg x 0.5) all other days), Disp: 90 tablet, Rfl: 1   warfarin (COUMADIN) 6 MG tablet, TAKE 1/2 TABLET BY MOUTH DAILY EXCEPT TAKE 1 TABLET ON FRIDAYS OR AS DIRECTED BY ANTICOAGULATION CLINIC (Patient taking differently: Take 6 mg by mouth See admin instructions.  TAKE 1/2 TABLET BY MOUTH DAILY EXCEPT TAKE 1 TABLET ON FRIDAYS OR AS DIRECTED BY ANTICOAGULATION CLINIC; 6 mg (6 mg x 1) every Fri; 3 mg (6 mg x 0.5) all other days), Disp: 60 tablet, Rfl: 1  Past Medical History: Past Medical History:  Diagnosis Date   Anxiety    Asthma    "no attack since acupuncture in 1990's"   Atrial fibrillation (HCC)    Complication of anesthesia    SLOW TO WAKE UP / DIFFICULTY RESPONDING 2003, 3 days before could use left arm, "wild/crazy sometimes"   COPD (chronic obstructive pulmonary disease) (HCC)    Cyst of left kidney    "benign"   DVT (deep venous thrombosis) (HCC) 06/23/2001   right femoral artery "from groin to knee"   Edema    Right Leg w/ h/o DVT postphlebitic   H/O blood transfusion reaction 06/23/2002   FFP, extreme swelling, could not breath   History of pulmonary embolism 06/23/2001   both lungs   Hyperlipidemia    Knee pain    left   LBP (low back pain)    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 Lynnette Caffey and Dr. Leafy Ro   Severe aortic stenosis    Spondylolisthesis    Warfarin anticoagulation    Wheezing    when laying on left side    Tobacco Use: Social History   Tobacco Use  Smoking Status Never  Smokeless Tobacco Never    Labs: Review Flowsheet  More data exists      Latest Ref Rng & Units 04/04/2021 11/12/2021 03/17/2022 12/16/2022 01/06/2023  Labs for ITP Cardiac and Pulmonary Rehab  Cholestrol 0 - 200 mg/dL - - 846  - -  LDL (calc) 0 - 99 mg/dL - - 962  - -  HDL-C >95.28 mg/dL - - 41.32  - -  Trlycerides 0.0 - 149.0 mg/dL - - 440.1  - -  Hemoglobin A1c 4.6 - 6.5 % 6.1  5.8  - - -  PH, Arterial 7.35 - 7.45 - - - 7.376  -  PCO2 arterial 32 - 48 mmHg - - - 39.5  -  Bicarbonate 20.0 -  28.0 mmol/L - - - 25.8  25.2  23.1  -  TCO2 22 - 32 mmol/L - - - 27  27  24   32   Acid-base deficit 0.0 - 2.0 mmol/L - - - 1.0  2.0  -  O2 Saturation % - - - 74  76  96  -    Details  Multiple values from one day are sorted in reverse-chronological order         Capillary Blood Glucose: Lab Results  Component Value Date   GLUCAP 231 (H) 04/05/2013   GLUCAP 132 (H) 04/05/2013   GLUCAP 110 (H) 05/21/2010     Exercise Target Goals: Exercise Program Goal: Individual exercise prescription set using results from initial 6 min walk test and THRR while considering  patient's activity barriers and safety.   Exercise Prescription Goal: Initial exercise prescription builds to 30-45 minutes a day of aerobic activity, 2-3 days per week.  Home exercise guidelines will be given to patient during program as part of exercise prescription that the participant will acknowledge.  Activity Barriers & Risk Stratification:  Activity Barriers & Cardiac Risk Stratification - 01/22/23 1231       Activity Barriers & Cardiac Risk Stratification   Activity Barriers Arthritis;Back Problems;Joint Problems;Deconditioning;Muscular Weakness;Shortness of Breath;Balance Concerns    Cardiac Risk Stratification High             6 Minute Walk:  6 Minute Walk     Row Name 01/22/23 1126         6 Minute Walk   Phase Initial     Distance 480 feet     Walk Time 6 minutes     # of Rest Breaks 2  2 breaks totaling 2:07 due to going over [facility]     MPH 1     METS 1.7     RPE 9     Perceived Dyspnea  1     VO2 Peak 1.03     Symptoms Yes (comment)     Comments SOB, RPD = 1     Resting HR 81 bpm     Resting BP 112/74     Resting Oxygen Saturation  95 %     Exercise Oxygen Saturation  during 6 min walk 94 %     Max Ex. HR 134 bpm     Max Ex. BP 144/78     2 Minute Post BP 124/80       Interval HR   1 Minute HR 114     2 Minute HR 129     3 Minute HR 114     4 Minute HR 112     5 Minute HR  126     6 Minute HR 114     2 Minute Post HR 87     Interval Heart Rate? Yes       Interval Oxygen   Interval Oxygen? Yes     Baseline Oxygen Saturation % 95 %     1 Minute Oxygen Saturation % 97 %     1 Minute Liters of Oxygen 0 L     2 Minute Oxygen Saturation % 94 %     2 Minute Liters of Oxygen 0 L     3 Minute Oxygen Saturation % 96 %     3 Minute Liters of Oxygen 0 L     4 Minute Oxygen Saturation % 96 %     4 Minute Liters of Oxygen 0 L     5 Minute Oxygen Saturation % 95 %     5 Minute Liters of Oxygen 0 L     6 Minute Oxygen Saturation % 97 %     6 Minute Liters of Oxygen 0 L              Oxygen Initial Assessment:  Oxygen Re-Evaluation:   Oxygen Discharge (Final Oxygen Re-Evaluation):   Initial Exercise Prescription:  Initial Exercise Prescription - 01/22/23 1200       Date of Initial Exercise RX and Referring Provider   Date 01/22/23    Referring Provider Jodelle Red, MD    Expected Discharge Date 04/15/23      T5 Nustep   Level 1    SPM 70    Minutes 25    METs 1.7      Prescription Details   Frequency (times per week) 3    Duration Progress to 30 minutes of continuous aerobic without signs/symptoms of physical distress      Intensity   THRR 40-80% of Max Heartrate 60-119    Ratings of Perceived Exertion 11-13    Perceived Dyspnea 0-4      Progression   Progression Continue progressive overload as per policy without signs/symptoms or physical distress.      Resistance Training   Training Prescription Yes    Weight 3 lbs    Reps 10-15             Perform Capillary Blood Glucose checks as needed.  Exercise Prescription Changes:   Exercise Prescription Changes     Row Name 01/26/23 1600             Response to Exercise   Blood Pressure (Admit) 114/78       Blood Pressure (Exercise) 122/78       Blood Pressure (Exit) 108/70       Heart Rate (Admit) 101 bpm       Heart Rate (Exercise) 147 bpm       Heart  Rate (Exit) 105 bpm       Rating of Perceived Exertion (Exercise) 9       Symptoms None       Comments Pt's first day in the CRP2 program.       Duration Progress to 30 minutes of  aerobic without signs/symptoms of physical distress       Intensity THRR unchanged         Progression   Progression Continue to progress workloads to maintain intensity without signs/symptoms of physical distress.       Average METs 2.1         Resistance Training   Training Prescription No       Weight Weights held due to increased HR         Interval Training   Interval Training No         T5 Nustep   Level 1       SPM 80       Minutes 20       METs 2.1                Exercise Comments:   Exercise Comments     Row Name 01/26/23 1619           Exercise Comments Pt's first day in the CRP2 program. Pt is very deconditioned. During warm up and and after completing nustep pt was over THRR. Weights were held due to this reason. Pt will most likely have to do warm-ups, cool-down and weights form the chair until he adjusts to the exercise bout.                Exercise Goals and Review:   Exercise Goals     Row Name 01/22/23 [phone]  Exercise Goals   Increase Physical Activity Yes       Intervention Provide advice, education, support and counseling about physical activity/exercise needs.;Develop an individualized exercise prescription for aerobic and resistive training based on initial evaluation findings, risk stratification, comorbidities and participant's personal goals.       Expected Outcomes Short Term: Attend rehab on a regular basis to increase amount of physical activity.;Long Term: Add in home exercise to make exercise part of routine and to increase amount of physical activity.;Long Term: Exercising regularly at least 3-5 days a week.       Increase Strength and Stamina Yes       Intervention Provide advice, education, support and counseling about physical  activity/exercise needs.;Develop an individualized exercise prescription for aerobic and resistive training based on initial evaluation findings, risk stratification, comorbidities and participant's personal goals.       Expected Outcomes Short Term: Increase workloads from initial exercise prescription for resistance, speed, and METs.;Short Term: Perform resistance training exercises routinely during rehab and add in resistance training at home;Long Term: Improve cardiorespiratory fitness, muscular endurance and strength as measured by increased METs and functional capacity ( )       Able to understand and use rate of perceived exertion (RPE) scale Yes       Intervention Provide education and explanation on how to use RPE scale       Expected Outcomes Short Term: Able to use RPE daily in rehab to express subjective intensity level;Long Term:  Able to use RPE to guide intensity level when exercising independently       Knowledge and understanding of Target Heart Rate Range (THRR) Yes       Intervention Provide education and explanation of THRR including how the numbers were predicted and where they are located for reference       Expected Outcomes Short Term: Able to state/look up THRR;Short Term: Able to use daily as guideline for intensity in rehab;Long Term: Able to use THRR to govern intensity when exercising independently       Understanding of Exercise Prescription Yes       Intervention Provide education, explanation, and written materials on patient's individual exercise prescription       Expected Outcomes Short Term: Able to explain program exercise prescription;Long Term: Able to explain home exercise prescription to exercise independently                Exercise Goals Re-Evaluation :  Exercise Goals Re-Evaluation     Row Name 01/26/23 1611             Exercise Goal Re-Evaluation   Exercise Goals Review Increase Physical Activity;Able to understand and use Dyspnea  scale;Increase Strength and Stamina;Knowledge and understanding of Target Heart Rate Range (THRR);Able to understand and use rate of perceived exertion (RPE) scale       Comments Pt's first day in the CRP2 program. Pt understands the exercise Rx, THRR, and RPE scale.       Expected Outcomes Will continue to monitor patient and progress exercise workloads as tolerated.                Discharge Exercise Prescription (Final Exercise Prescription Changes):  Exercise Prescription Changes - 01/26/23 1600       Response to Exercise   Blood Pressure (Admit) 114/78    Blood Pressure (Exercise) 122/78    Blood Pressure (Exit) 108/70    Heart Rate (Admit) 101 bpm    Heart Rate (Exercise) 147 bpm  Heart Rate (Exit) 105 bpm    Rating of Perceived Exertion (Exercise) 9    Symptoms None    Comments Pt's first day in the CRP2 program.    Duration Progress to 30 minutes of  aerobic without signs/symptoms of physical distress    Intensity THRR unchanged      Progression   Progression Continue to progress workloads to maintain intensity without signs/symptoms of physical distress.    Average METs 2.1      Resistance Training   Training Prescription No    Weight Weights held due to increased HR      Interval Training   Interval Training No      T5 Nustep   Level 1    SPM 80    Minutes 20    METs 2.1             Nutrition:  Target Goals: Understanding of nutrition guidelines, daily intake of sodium 1500mg , cholesterol 200mg , calories 30% from fat and 7% or less from saturated fats, daily to have 5 or more servings of fruits and vegetables.  Biometrics:  Pre Biometrics - 01/22/23 1000       Pre Biometrics   Waist Circumference 55.25 inches    Hip Circumference 52 inches    Waist to Hip Ratio 1.06 %    Triceps Skinfold 34 mm    % Body Fat 45.3 %    Grip Strength 34 kg    Flexibility --   Not performed due to significant back issues   Single Leg Stand 1.18 seconds               Nutrition Therapy Plan and Nutrition Goals:  Nutrition Therapy & Goals - 01/26/23 1624       Nutrition Therapy   Diet Heart Healthy Diet    Drug/Food Interactions Coumadin/Vit K      Personal Nutrition Goals   Nutrition Goal Patient to identify strategies for reducing cardiovascular risk by attending the [employer] education and nutrition series weekly.    Personal Goal #2 Patient to improve diet quality by using the plate method as a guide for meal planning to include lean protein/plant protein, fruits, vegetables, whole grains, nonfat dairy as part of a well-balanced diet.    Personal Goal #3 Patient to reduce sodium intake to 1500mg  per day    Personal Goal #4 Patient to identify strategies for weight loss of 0.5-2.0# per week.    Comments Elijah Birk reports that he has started making some dietary changes including increased fish intake and decreased fast food. He is motivated to lose weight and improve eating habits.Patient will benefit from participation in intensive cardiac rehab for nutrition, exercise, and lifestyle modification.      Intervention Plan   Intervention Prescribe, educate and counsel regarding individualized specific dietary modifications aiming towards targeted core components such as weight, hypertension, lipid management, diabetes, heart failure and other comorbidities.;Nutrition handout(s) given to patient.    Expected Outcomes Short Term Goal: Understand basic principles of dietary content, such as calories, fat, sodium, cholesterol and nutrients.;Long Term Goal: Adherence to prescribed nutrition plan.             Nutrition Assessments:  Nutrition Assessments - 01/26/23 1629       Rate Your Plate Scores   Pre Score 63            MEDIFICTS Score Key: ?70 Need to make dietary changes  40-70 Heart Healthy Diet ? 40 Therapeutic Level Cholesterol Diet  Flowsheet Row INTENSIVE CARDIAC REHAB from 01/26/2023 in [facility]  for Heart, Vascular, & Lung Health  Picture Your Plate Total Score on Admission 63      Picture Your Plate Scores: <56 Unhealthy dietary pattern with much room for improvement. 41-50 Dietary pattern unlikely to meet recommendations for good health and room for improvement. 51-60 More healthful dietary pattern, with some room for improvement.  >60 Healthy dietary pattern, although there may be some specific behaviors that could be improved.    Nutrition Goals Re-Evaluation:  Nutrition Goals Re-Evaluation     Row Name 01/26/23 1624             Goals   Current Weight 307 lb 15.7 oz (139.7 kg)       Comment triglycerides 168, HDL 36, LDL 121, A1c 5.8. He continues to see the anti-coagulation clinic regularly       Expected Outcome [name] reports that he has started making some dietary changes including increased fish intake and decreased fast food. He is motivated to lose weight and improve eating habits.Patient will benefit from participation in intensive cardiac rehab for nutrition, exercise, and lifestyle modification.                Nutrition Goals Re-Evaluation:  Nutrition Goals Re-Evaluation     Row Name 01/26/23 1624             Goals   Current Weight 307 lb 15.7 oz (139.7 kg)       Comment triglycerides 168, HDL 36, LDL 121, A1c 5.8. He continues to see the anti-coagulation clinic regularly       Expected Outcome [name] reports that he has started making some dietary changes including increased fish intake and decreased fast food. He is motivated to lose weight and improve eating habits.Patient will benefit from participation in intensive cardiac rehab for nutrition, exercise, and lifestyle modification.                Nutrition Goals Discharge (Final Nutrition Goals Re-Evaluation):  Nutrition Goals Re-Evaluation - 01/26/23 1624       Goals   Current Weight 307 lb 15.7 oz (139.7 kg)    Comment triglycerides 168, HDL 36, LDL 121, A1c 5.8. He continues to see the  anti-coagulation clinic regularly    Expected Outcome [name] reports that he has started making some dietary changes including increased fish intake and decreased fast food. He is motivated to lose weight and improve eating habits.Patient will benefit from participation in intensive cardiac rehab for nutrition, exercise, and lifestyle modification.             Psychosocial: Target Goals: Acknowledge presence or absence of significant depression and/or stress, maximize coping skills, provide positive support system. Participant is able to verbalize types and ability to use techniques and skills needed for reducing stress and depression.  Initial Review & Psychosocial Screening:  Initial Psych Review & Screening - 01/22/23 1205       Initial Review   Current issues with None Identified      Family Dynamics   Good Support System? Yes   Has spouse and sons, family for support     Barriers   Psychosocial barriers to participate in program The patient should benefit from training in stress management and relaxation.      Screening Interventions   Interventions Encouraged to exercise             Quality of Life Scores:  Quality of Life -  01/22/23 1237       Quality of Life   Select Quality of Life      Quality of Life Scores   Health/Function Pre 11.17 %    Socioeconomic Pre 23 %    Psych/Spiritual Pre 21.57 %    Family Pre 21.6 %    GLOBAL Pre 17.11 %            Scores of 19 and below usually indicate a poorer quality of life in these areas.  A difference of  2-3 points is a clinically meaningful difference.  A difference of 2-3 points in the total score of the Quality of Life Index has been associated with significant improvement in overall quality of life, self-image, physical symptoms, and general health in studies assessing change in quality of life.  PHQ-9: Review Flowsheet  More data exists      01/27/2023 01/22/2023 10/16/2022 01/20/2022 08/08/2021  Depression  screen PHQ 2/9  Decreased Interest 0 0 0 0 0  Down, Depressed, Hopeless 0 0 0 0 0  PHQ - 2 Score 0 0 0 0 0  Altered sleeping 0 0 - - -  Tired, decreased energy 3 3 - - -  Change in appetite 0 0 - - -  Feeling bad or failure about yourself  1 1 - - -  Trouble concentrating 1 1 - - -  Moving slowly or fidgety/restless 0 0 - - -  Suicidal thoughts 0 0 - - -  PHQ-9 Score 5 5 - - -  Difficult doing work/chores Extremely dIfficult Extremely dIfficult - - -    Details           Interpretation of Total Score  Total Score Depression Severity:  1-4 = Minimal depression, 5-9 = Mild depression, 10-14 = Moderate depression, 15-19 = Moderately severe depression, 20-27 = Severe depression   Psychosocial Evaluation and Intervention:   Psychosocial Re-Evaluation:  Psychosocial Re-Evaluation     Row Name 02/17/23 1538             Psychosocial Re-Evaluation   Current issues with None Identified       Interventions Encouraged to attend Cardiac Rehabilitation for the exercise       Continue Psychosocial Services  No Follow up required                Psychosocial Discharge (Final Psychosocial Re-Evaluation):  Psychosocial Re-Evaluation - 02/17/23 1538       Psychosocial Re-Evaluation   Current issues with None Identified    Interventions Encouraged to attend Cardiac Rehabilitation for the exercise    Continue Psychosocial Services  No Follow up required             Vocational Rehabilitation: Provide vocational rehab assistance to qualifying candidates.   Vocational Rehab Evaluation & Intervention:  Vocational Rehab - 01/22/23 1209       Initial Vocational Rehab Evaluation & Intervention   Assessment shows need for Vocational Rehabilitation No   Pt is retired            Education: Education Goals: Education classes will be provided on a weekly basis, covering required topics. Participant will state understanding/return demonstration of topics presented.     Education     Row Name 01/26/23 1300     Education   Cardiac Education Topics [employer]   Glass blower/designer Nutrition   Nutrition Workshop Fueling a Fish farm manager  Review Code 1- Verbalizes Understanding   Class Start Time 1145   Class Stop Time 1234   Class Time Calculation (min) 49 min    Row Name 01/28/23 1400     Education   Cardiac Education Topics [employer]   Customer service manager   Weekly Topic Simple Sides and Sauces   Instruction Review Code 1- Verbalizes Understanding   Class Start Time 1145   Class Stop Time 1226   Class Time Calculation (min) 41 min    Row Name 01/30/23 1200     Education   Cardiac Education Topics [employer]   Nurse, children's Exercise Physiologist   Select Psychosocial   Psychosocial How Our Thoughts Can Heal Our Hearts   Instruction Review Code 1- Verbalizes Understanding   Class Start Time 1150   Class Stop Time 1230   Class Time Calculation (min) 40 min    Row Name 02/02/23 1300     Education   Cardiac Education Topics [employer]   Select Workshops     Workshops   Educator Exercise Physiologist   Select Exercise   Exercise Workshop Managing Heart Disease: Your Path to a Healthier Heart   Instruction Review Code 1- Verbalizes Understanding   Class Start Time 1147   Class Stop Time 1256   Class Time Calculation (min) 69 min    Row Name 02/04/23 1500     Education   Cardiac Education Topics Pritikin   Orthoptist   Educator Dietitian   Weekly Topic Powerhouse Plant-Based Proteins   Instruction Review Code 1- Verbalizes Understanding   Class Start Time 1145   Class Stop Time 1228   Class Time Calculation (min) 43 min    Row Name 02/06/23 1200     Education   Cardiac Education Topics Pritikin   Careers information officer Education   General Education Hypertension and Heart Disease   Instruction Review Code 1- Verbalizes Understanding   Class Start Time 1200   Class Stop Time 1239   Class Time Calculation (min) 39 min    Row Name 02/09/23 1200     Education   Cardiac Education Topics Pritikin   Geographical information systems officer Psychosocial   Psychosocial Workshop From Head to Heart: The Power of a Healthy Outlook   Instruction Review Code 1- Verbalizes Understanding   Class Start Time 1155   Class Stop Time 1245   Class Time Calculation (min) 50 min    Row Name 02/11/23 1300     Education   Cardiac Education Topics Pritikin   Customer service manager   Weekly Topic Adding Flavor - Sodium-Free   Instruction Review Code 1- Verbalizes Understanding   Class Start Time 1140   Class Stop Time 1215   Class Time Calculation (min) 35 min            Core Videos: Exercise    Move It!  Clinical staff conducted group or individual video education with verbal and written material and guidebook.  Patient learns the recommended Pritikin exercise program. Exercise with the goal of living a long, healthy life. Some of the health benefits of exercise include controlled  diabetes, healthier blood pressure levels, improved cholesterol levels, improved heart and lung capacity, improved sleep, and better body composition. Everyone should speak with their doctor before starting or changing an exercise routine.  Biomechanical Limitations Clinical staff conducted group or individual video education with verbal and written material and guidebook.  Patient learns how biomechanical limitations can impact exercise and how we can mitigate and possibly overcome limitations to have an impactful and balanced exercise routine.  Body Composition Clinical staff conducted group or individual video education with verbal and  written material and guidebook.  Patient learns that body composition (ratio of muscle mass to fat mass) is a key component to assessing overall fitness, rather than body weight alone. Increased fat mass, especially visceral belly fat, can put [place] at increased risk for metabolic syndrome, type 2 diabetes, heart disease, and even death. It is recommended to combine diet and exercise (cardiovascular and resistance training) to improve your body composition. Seek guidance from your physician and exercise physiologist before implementing an exercise routine.  Exercise Action Plan Clinical staff conducted group or individual video education with verbal and written material and guidebook.  Patient learns the recommended strategies to achieve and enjoy long-term exercise adherence, including variety, self-motivation, self-efficacy, and positive decision making. Benefits of exercise include fitness, good health, weight management, more energy, better sleep, less stress, and overall well-being.  Medical   Heart Disease Risk Reduction Clinical staff conducted group or individual video education with verbal and written material and guidebook.  Patient learns our heart is our most vital organ as it circulates oxygen, nutrients, white blood cells, and hormones throughout the entire body, and carries waste away. Data supports a plant-based eating plan like the Pritikin Program for its effectiveness in slowing progression of and reversing heart disease. The video provides a number of recommendations to address heart disease.   Metabolic Syndrome and Belly Fat  Clinical staff conducted group or individual video education with verbal and written material and guidebook.  Patient learns what metabolic syndrome is, how it leads to heart disease, and how one can reverse it and keep it from coming back. You have metabolic syndrome if you have 3 of the following 5 criteria: abdominal obesity, high blood pressure, high  triglycerides, low HDL cholesterol, and high blood sugar.  Hypertension and Heart Disease Clinical staff conducted group or individual video education with verbal and written material and guidebook.  Patient learns that high blood pressure, or hypertension, is very common in the [place]. Hypertension is largely due to excessive salt intake, but other important risk factors include being overweight, physical inactivity, drinking too much alcohol, smoking, and not eating enough potassium from fruits and vegetables. High blood pressure is a leading risk factor for heart attack, stroke, congestive heart failure, dementia, kidney failure, and premature death. Long-term effects of excessive salt intake include stiffening of the arteries and thickening of heart muscle and organ damage. Recommendations include ways to reduce hypertension and the risk of heart disease.  Diseases of Our Time - Focusing on Diabetes Clinical staff conducted group or individual video education with verbal and written material and guidebook.  Patient learns why the best way to stop diseases of our time is prevention, through food and other lifestyle changes. Medicine (such as prescription pills and surgeries) is often only a Band-Aid on the problem, not a long-term solution. Most common diseases of our time include obesity, type 2 diabetes, hypertension, heart disease, and cancer. The Pritikin Program is recommended and has been  proven to help reduce, reverse, and/or prevent the damaging effects of metabolic syndrome.  Nutrition   Overview of the Pritikin Eating Plan  Clinical staff conducted group or individual video education with verbal and written material and guidebook.  Patient learns about the Pritikin Eating Plan for disease risk reduction. The Pritikin Eating Plan emphasizes a wide variety of unrefined, minimally-processed carbohydrates, like fruits, vegetables, whole grains, and legumes. Go, Caution, and Stop food  choices are explained. Plant-based and lean animal proteins are emphasized. Rationale provided for low sodium intake for blood pressure control, low added sugars for blood sugar stabilization, and low added fats and oils for coronary artery disease risk reduction and weight management.  Calorie Density  Clinical staff conducted group or individual video education with verbal and written material and guidebook.  Patient learns about calorie density and how it impacts the Pritikin Eating Plan. Knowing the characteristics of the food you choose will help you decide whether those foods will lead to weight gain or weight loss, and whether you want to consume more or less of them. Weight loss is usually a side effect of the Pritikin Eating Plan because of its focus on low calorie-dense foods.  Label Reading  Clinical staff conducted group or individual video education with verbal and written material and guidebook.  Patient learns about the Pritikin recommended label reading guidelines and corresponding recommendations regarding calorie density, added sugars, sodium content, and whole grains.  Dining Out - Part 1  Clinical staff conducted group or individual video education with verbal and written material and guidebook.  Patient learns that restaurant meals can be sabotaging because they can be so high in calories, fat, sodium, and/or sugar. Patient learns recommended strategies on how to positively address this and avoid unhealthy pitfalls.  Facts on Fats  Clinical staff conducted group or individual video education with verbal and written material and guidebook.  Patient learns that lifestyle modifications can be just as effective, if not more so, as many medications for lowering your risk of heart disease. A Pritikin lifestyle can help to reduce your risk of inflammation and atherosclerosis (cholesterol build-up, or plaque, in the artery walls). Lifestyle interventions such as dietary choices and  physical activity address the cause of atherosclerosis. A review of the types of fats and their impact on blood cholesterol levels, along with dietary recommendations to reduce fat intake is also included.  Nutrition Action Plan  Clinical staff conducted group or individual video education with verbal and written material and guidebook.  Patient learns how to incorporate Pritikin recommendations into their lifestyle. Recommendations include planning and keeping personal health goals in mind as an important part of their success.  Healthy Mind-Set    Healthy Minds, Bodies, Hearts  Clinical staff conducted group or individual video education with verbal and written material and guidebook.  Patient learns how to identify when they are stressed. Video will discuss the impact of that stress, as well as the many benefits of stress management. Patient will also be introduced to stress management techniques. The way we think, act, and feel has an impact on our hearts.  How Our Thoughts Can Heal Our Hearts  Clinical staff conducted group or individual video education with verbal and written material and guidebook.  Patient learns that negative thoughts can cause depression and anxiety. This can result in negative lifestyle behavior and serious health problems. Cognitive behavioral therapy is an effective method to help control our thoughts in order to change and improve our emotional outlook.  Additional Videos:  Exercise    Improving Performance  Clinical staff conducted group or individual video education with verbal and written material and guidebook.  Patient learns to use a non-linear approach by alternating intensity levels and lengths of time spent exercising to help burn more calories and lose more body fat. Cardiovascular exercise helps improve heart health, metabolism, hormonal balance, blood sugar control, and recovery from fatigue. Resistance training improves strength, endurance, balance,  coordination, reaction time, metabolism, and muscle mass. Flexibility exercise improves circulation, posture, and balance. Seek guidance from your physician and exercise physiologist before implementing an exercise routine and learn your capabilities and proper form for all exercise.  Introduction to Yoga  Clinical staff conducted group or individual video education with verbal and written material and guidebook.  Patient learns about yoga, a discipline of the coming together of mind, breath, and body. The benefits of yoga include improved flexibility, improved range of motion, better posture and core strength, increased lung function, weight loss, and positive self-image. Yoga's heart health benefits include lowered blood pressure, healthier heart rate, decreased cholesterol and triglyceride levels, improved immune function, and reduced stress. Seek guidance from your physician and exercise physiologist before implementing an exercise routine and learn your capabilities and proper form for all exercise.  Medical   Aging: Enhancing Your Quality of Life  Clinical staff conducted group or individual video education with verbal and written material and guidebook.  Patient learns key strategies and recommendations to stay in good physical health and enhance quality of life, such as prevention strategies, having an advocate, securing a Health Care Proxy and Power of Attorney, and keeping a list of medications and system for tracking them. It also discusses how to avoid risk for bone loss.  Biology of Weight Control  Clinical staff conducted group or individual video education with verbal and written material and guidebook.  Patient learns that weight gain occurs because we consume more calories than we burn (eating more, moving less). Even if your body weight is normal, you may have higher ratios of fat compared to muscle mass. Too much body fat puts you at increased risk for cardiovascular disease, heart  attack, stroke, type 2 diabetes, and obesity-related cancers. In addition to exercise, following the Pritikin Eating Plan can help reduce your risk.  Decoding Lab Results  Clinical staff conducted group or individual video education with verbal and written material and guidebook.  Patient learns that lab test reflects one measurement whose values change over time and are influenced by many factors, including medication, stress, sleep, exercise, food, hydration, pre-existing medical conditions, and more. It is recommended to use the knowledge from this video to become more involved with your lab results and evaluate your numbers to speak with your doctor.   Diseases of Our Time - Overview  Clinical staff conducted group or individual video education with verbal and written material and guidebook.  Patient learns that according to the CDC, 50% to 70% of chronic diseases (such as obesity, type 2 diabetes, elevated lipids, hypertension, and heart disease) are avoidable through lifestyle improvements including healthier food choices, listening to satiety cues, and increased physical activity.  Sleep Disorders Clinical staff conducted group or individual video education with verbal and written material and guidebook.  Patient learns how good quality and duration of sleep are important to overall health and well-being. Patient also learns about sleep disorders and how they impact health along with recommendations to address them, including discussing with a physician.  Nutrition  Dining  Out - Part 2 Clinical staff conducted group or individual video education with verbal and written material and guidebook.  Patient learns how to plan ahead and communicate in order to maximize their dining experience in a healthy and nutritious manner. Included are recommended food choices based on the type of restaurant the patient is visiting.   Fueling a Banker conducted group or individual  video education with verbal and written material and guidebook.  There is a strong connection between our food choices and our health. Diseases like obesity and type 2 diabetes are very prevalent and are in large-part due to lifestyle choices. The Pritikin Eating Plan provides plenty of food and hunger-curbing satisfaction. It is easy to follow, affordable, and helps reduce health risks.  Menu Workshop  Clinical staff conducted group or individual video education with verbal and written material and guidebook.  Patient learns that restaurant meals can sabotage health goals because they are often packed with calories, fat, sodium, and sugar. Recommendations include strategies to plan ahead and to communicate with the manager, chef, or server to help order a healthier meal.  Planning Your Eating Strategy  Clinical staff conducted group or individual video education with verbal and written material and guidebook.  Patient learns about the Pritikin Eating Plan and its benefit of reducing the risk of disease. The Pritikin Eating Plan does not focus on calories. Instead, it emphasizes high-quality, nutrient-rich foods. By knowing the characteristics of the foods, we choose, we can determine their calorie density and make informed decisions.  Targeting Your Nutrition Priorities  Clinical staff conducted group or individual video education with verbal and written material and guidebook.  Patient learns that lifestyle habits have a tremendous impact on disease risk and progression. This video provides eating and physical activity recommendations based on your personal health goals, such as reducing LDL cholesterol, losing weight, preventing or controlling type 2 diabetes, and reducing high blood pressure.  Vitamins and Minerals  Clinical staff conducted group or individual video education with verbal and written material and guidebook.  Patient learns different ways to obtain key vitamins and minerals,  including through a recommended healthy diet. It is important to discuss all supplements you take with your doctor.   Healthy Mind-Set    Smoking Cessation  Clinical staff conducted group or individual video education with verbal and written material and guidebook.  Patient learns that cigarette smoking and tobacco addiction pose a serious health risk which affects millions of people. Stopping smoking will significantly reduce the risk of heart disease, lung disease, and many forms of cancer. Recommended strategies for quitting are covered, including working with your doctor to develop a successful plan.  Culinary   Becoming a Set designer conducted group or individual video education with verbal and written material and guidebook.  Patient learns that cooking at home can be healthy, cost-effective, quick, and puts them in control. Keys to cooking healthy recipes will include looking at your recipe, assessing your equipment needs, planning ahead, making it simple, choosing cost-effective seasonal ingredients, and limiting the use of added fats, salts, and sugars.  Cooking - Breakfast and Snacks  Clinical staff conducted group or individual video education with verbal and written material and guidebook.  Patient learns how important breakfast is to satiety and nutrition through the entire day. Recommendations include key foods to eat during breakfast to help stabilize blood sugar levels and to prevent overeating at meals later in the day. Planning ahead is also  a key component.  Cooking - Educational psychologist conducted group or individual video education with verbal and written material and guidebook.  Patient learns eating strategies to improve overall health, including an approach to cook more at home. Recommendations include thinking of animal protein as a side on your plate rather than center stage and focusing instead on lower calorie dense options like vegetables,  fruits, whole grains, and plant-based proteins, such as beans. Making sauces in large quantities to freeze for later and leaving the skin on your vegetables are also recommended to maximize your experience.  Cooking - Healthy Salads and Dressing Clinical staff conducted group or individual video education with verbal and written material and guidebook.  Patient learns that vegetables, fruits, whole grains, and legumes are the foundations of the Pritikin Eating Plan. Recommendations include how to incorporate each of these in flavorful and healthy salads, and how to create homemade salad dressings. Proper handling of ingredients is also covered. Cooking - Soups and [employer] - Soups and Desserts Clinical staff conducted group or individual video education with verbal and written material and guidebook.  Patient learns that Pritikin soups and desserts make for easy, nutritious, and delicious snacks and meal components that are low in sodium, fat, sugar, and calorie density, while high in vitamins, minerals, and filling fiber. Recommendations include simple and healthy ideas for soups and desserts.   Overview     The Pritikin Solution Program Overview Clinical staff conducted group or individual video education with verbal and written material and guidebook.  Patient learns that the results of the Pritikin Program have been documented in more than 100 articles published in peer-reviewed journals, and the benefits include reducing risk factors for (and, in some cases, even reversing) high cholesterol, high blood pressure, type 2 diabetes, obesity, and more! An overview of the three key pillars of the Pritikin Program will be covered: eating well, doing regular exercise, and having a healthy mind-set.  WORKSHOPS  Exercise: Exercise Basics: Building Your Action Plan Clinical staff led group instruction and group discussion with PowerPoint presentation and patient guidebook. To enhance the  learning environment the use of posters, models and videos may be added. At the conclusion of this workshop, patients will comprehend the difference between physical activity and exercise, as well as the benefits of incorporating both, into their routine. Patients will understand the FITT (Frequency, Intensity, Time, and Type) principle and how to use it to build an exercise action plan. In addition, safety concerns and other considerations for exercise and cardiac rehab will be addressed by the presenter. The purpose of this lesson is to promote a comprehensive and effective weekly exercise routine in order to improve patients' overall level of fitness.   Managing Heart Disease: Your Path to a Healthier Heart Clinical staff led group instruction and group discussion with PowerPoint presentation and patient guidebook. To enhance the learning environment the use of posters, models and videos may be added.At the conclusion of this workshop, patients will understand the anatomy and physiology of the heart. Additionally, they will understand how Pritikin's three pillars impact the risk factors, the progression, and the management of heart disease.  The purpose of this lesson is to provide a high-level overview of the heart, heart disease, and how the Pritikin lifestyle positively impacts risk factors.  Exercise Biomechanics Clinical staff led group instruction and group discussion with PowerPoint presentation and patient guidebook. To enhance the learning environment the use of posters, models and videos may  be added. Patients will learn how the structural parts of their bodies function and how these functions impact their daily activities, movement, and exercise. Patients will learn how to promote a neutral spine, learn how to manage pain, and identify ways to improve their physical movement in order to promote healthy living. The purpose of this lesson is to expose patients to common  physical limitations that impact physical activity. Participants will learn practical ways to adapt and manage aches and pains, and to minimize their effect on regular exercise. Patients will learn how to maintain good posture while sitting, walking, and lifting.  Balance Training and Fall Prevention  Clinical staff led group instruction and group discussion with PowerPoint presentation and patient guidebook. To enhance the learning environment the use of posters, models and videos may be added. At the conclusion of this workshop, patients will understand the importance of their sensorimotor skills (vision, proprioception, and the vestibular system) in maintaining their ability to balance as they age. Patients will apply a variety of balancing exercises that are appropriate for their current level of function. Patients will understand the common causes for poor balance, possible solutions to these problems, and ways to modify their physical environment in order to minimize their fall risk. The purpose of this lesson is to teach patients about the importance of maintaining balance as they age and ways to minimize their risk of falling.  WORKSHOPS   Nutrition:  Fueling a Ship broker led group instruction and group discussion with PowerPoint presentation and patient guidebook. To enhance the learning environment the use of posters, models and videos may be added. Patients will review the foundational principles of the Pritikin Eating Plan and understand what constitutes a serving size in each of the food groups. Patients will also learn Pritikin-friendly foods that are better choices when away from home and review make-ahead meal and snack options. Calorie density will be reviewed and applied to three nutrition priorities: weight maintenance, weight loss, and weight gain. The purpose of this lesson is to reinforce (in a group setting) the key concepts around what patients are  recommended to eat and how to apply these guidelines when away from home by planning and selecting Pritikin-friendly options. Patients will understand how calorie density may be adjusted for different weight management goals.  Mindful Eating  Clinical staff led group instruction and group discussion with PowerPoint presentation and patient guidebook. To enhance the learning environment the use of posters, models and videos may be added. Patients will briefly review the concepts of the Pritikin Eating Plan and the importance of low-calorie dense foods. The concept of mindful eating will be introduced as well as the importance of paying attention to internal hunger signals. Triggers for non-hunger eating and techniques for dealing with triggers will be explored. The purpose of this lesson is to provide patients with the opportunity to review the basic principles of the [facility] Eating Plan, discuss the value of eating mindfully and how to measure internal cues of hunger and fullness using the Hunger Scale. Patients will also discuss reasons for non-hunger eating and learn strategies to use for controlling emotional eating.  Targeting Your Nutrition Priorities Clinical staff led group instruction and group discussion with PowerPoint presentation and patient guidebook. To enhance the learning environment the use of posters, models and videos may be added. Patients will learn how to determine their genetic susceptibility to disease by reviewing their family history. Patients will gain insight into the importance of diet as part of  an overall healthy lifestyle in mitigating the impact of genetics and other environmental insults. The purpose of this lesson is to provide patients with the opportunity to assess their personal nutrition priorities by looking at their family history, their own health history and current risk factors. Patients will also be able to discuss ways of prioritizing and modifying the [facility]in  Eating Plan for their highest risk areas  Menu  Clinical staff led group instruction and group discussion with PowerPoint presentation and patient guidebook. To enhance the learning environment the use of posters, models and videos may be added. Using menus brought in from [employer], or printed from [employer], patients will apply the Pritikin dining out guidelines that were presented in the [employer] video. Patients will also be able to practice these guidelines in a variety of provided scenarios. The purpose of this lesson is to provide patients with the opportunity to practice hands-on learning of the [facility]in Dining Out guidelines with actual menus and practice scenarios.  Label Reading Clinical staff led group instruction and group discussion with PowerPoint presentation and patient guidebook. To enhance the learning environment the use of posters, models and videos may be added. Patients will review and discuss the [facility]in label reading guidelines presented in [employer]'s Label Reading Educational series video. Using fool labels brought in from local grocery stores and markets, patients will apply the label reading guidelines and determine if the packaged food meet the Pritikin guidelines. The purpose of this lesson is to provide patients with the opportunity to review, discuss, and practice hands-on learning of the [facility]in Label Reading guidelines with actual packaged food labels. Cooking School  [facility]'s [employer] are designed to teach patients ways to prepare quick, simple, and affordable recipes at home. The importance of nutrition's role in chronic disease risk reduction is reflected in its emphasis in the overall Pritikin program. By learning how to prepare essential core Pritikin Eating Plan recipes, patients will increase control over what they eat; be able to customize the flavor of foods without the use of added salt, sugar, or fat; and  improve the quality of the food they consume. By learning a set of core recipes which are easily assembled, quickly prepared, and affordable, patients are more likely to prepare more healthy foods at home. These workshops focus on convenient breakfasts, simple entres, side dishes, and desserts which can be prepared with minimal effort and are consistent with nutrition recommendations for cardiovascular risk reduction. Cooking [employer] are taught by a Armed forces logistics/support/administrative officer (RD) who has been trained by the [employer]. The chef or RD has a clear understanding of the importance of minimizing - if not completely eliminating - added fat, sugar, and sodium in recipes. Throughout the series of Cooking School Workshop sessions, patients will learn about healthy ingredients and efficient methods of cooking to build confidence in their capability to prepare    Cooking School weekly topics:  Adding Flavor- Sodium-Free  Fast and Healthy Breakfasts  Powerhouse Plant-Based Proteins  Satisfying Salads and Dressings  Simple Sides and Sauces  International Cuisine-Spotlight on the [employer] Zones  Delicious Desserts  Savory Soups  [employer] - Meals in a Astronomer Appetizers and Snacks  Comforting Weekend Breakfasts  One-Pot Wonders   Fast Evening Meals  Landscape architect Your Pritikin Plate  WORKSHOPS   Healthy Mindset (Psychosocial):  Focused Goals, Sustainable Changes Clinical staff led group instruction and group discussion with PowerPoint presentation and patient  guidebook. To enhance the learning environment the use of posters, models and videos may be added. Patients will be able to apply effective goal setting strategies to establish at least one personal goal, and then take consistent, meaningful action toward that goal. They will learn to identify common barriers to achieving personal goals and develop strategies to overcome them. Patients will  also gain an understanding of how our mind-set can impact our ability to achieve goals and the importance of cultivating a positive and growth-oriented mind-set. The purpose of this lesson is to provide patients with a deeper understanding of how to set and achieve personal goals, as well as the tools and strategies needed to overcome common obstacles which may arise along the way.  From Head to Heart: The Power of a Healthy Outlook  Clinical staff led group instruction and group discussion with PowerPoint presentation and patient guidebook. To enhance the learning environment the use of posters, models and videos may be added. Patients will be able to recognize and describe the impact of emotions and mood on physical health. They will discover the importance of self-care and explore self-care practices which may work for them. Patients will also learn how to utilize the 4 C's to cultivate a healthier outlook and better manage stress and challenges. The purpose of this lesson is to demonstrate to patients how a healthy outlook is an essential part of maintaining good health, especially as they continue their cardiac rehab journey.  Healthy Sleep for a Healthy Heart Clinical staff led group instruction and group discussion with PowerPoint presentation and patient guidebook. To enhance the learning environment the use of posters, models and videos may be added. At the conclusion of this workshop, patients will be able to demonstrate knowledge of the importance of sleep to overall health, well-being, and quality of life. They will understand the symptoms of, and treatments for, common sleep disorders. Patients will also be able to identify daytime and nighttime behaviors which impact sleep, and they will be able to apply these tools to help manage sleep-related challenges. The purpose of this lesson is to provide patients with a general overview of sleep and outline the importance of quality sleep. Patients will  learn about a few of the most common sleep disorders. Patients will also be introduced to the concept of "sleep hygiene," and discover ways to self-manage certain sleeping problems through simple daily behavior changes. Finally, the workshop will motivate patients by clarifying the links between quality sleep and their goals of heart-healthy living.   Recognizing and Reducing Stress Clinical staff led group instruction and group discussion with PowerPoint presentation and patient guidebook. To enhance the learning environment the use of posters, models and videos may be added. At the conclusion of this workshop, patients will be able to understand the types of stress reactions, differentiate between acute and chronic stress, and recognize the impact that chronic stress has on their health. They will also be able to apply different coping mechanisms, such as reframing negative self-talk. Patients will have the opportunity to practice a variety of stress management techniques, such as deep abdominal breathing, progressive muscle relaxation, and/or guided imagery.  The purpose of this lesson is to educate patients on the role of stress in their lives and to provide healthy techniques for coping with it.  Learning Barriers/Preferences:  Learning Barriers/Preferences - 01/22/23 1238       Learning Barriers/Preferences   Learning Barriers Sight    Learning Preferences Audio;Verbal Instruction;Video;Computer/Internet;Written Material;Group Instruction;Individual Instruction;Pictoral;Skilled Demonstration  Education Topics:  Knowledge Questionnaire Score:  Knowledge Questionnaire Score - 01/22/23 1238       Knowledge Questionnaire Score   Pre Score 21/24             Core Components/Risk Factors/Patient Goals at Admission:  Personal Goals and Risk Factors at Admission - 01/22/23 1210       Core Components/Risk Factors/Patient Goals on Admission    Weight Management  Yes;Obesity;Weight Loss    Intervention Weight Management: Develop a combined nutrition and exercise program designed to reach desired caloric intake, while maintaining appropriate intake of nutrient and fiber, sodium and fats, and appropriate energy expenditure required for the weight goal.;Weight Management: Provide education and appropriate resources to help participant work on and attain dietary goals.;Weight Management/Obesity: Establish reasonable short term and long term weight goals.;Obesity: Provide education and appropriate resources to help participant work on and attain dietary goals.    Admit Weight 310 lb 13.6 oz (141 kg)    Expected Outcomes Short Term: Continue to assess and modify interventions until short term weight is achieved;Long Term: Adherence to nutrition and physical activity/exercise program aimed toward attainment of established weight goal;Weight Loss: Understanding of general recommendations for a balanced deficit meal plan, which promotes 1-2 lb weight loss per week and includes a negative energy balance of [phone] kcal/d;Understanding recommendations for meals to include 15-35% energy as protein, 25-35% energy from fat, 35-60% energy from carbohydrates, less than 200mg  of dietary cholesterol, 20-35 gm of total fiber daily;Understanding of distribution of calorie intake throughout the day with the consumption of 4-5 meals/snacks    Hypertension Yes    Intervention Provide education on lifestyle modifcations including regular physical activity/exercise, weight management, moderate sodium restriction and increased consumption of fresh fruit, vegetables, and low fat dairy, alcohol moderation, and smoking cessation.;Monitor prescription use compliance.    Expected Outcomes Short Term: Continued assessment and intervention until BP is < 140/9mm HG in hypertensive participants. < 130/44mm HG in hypertensive participants with diabetes, heart failure or chronic kidney disease.;Long  Term: Maintenance of blood pressure at goal levels.    Stress Yes    Intervention Offer individual and/or small group education and counseling on adjustment to heart disease, stress management and health-related lifestyle change. Teach and support self-help strategies.;Refer participants experiencing significant psychosocial distress to appropriate mental health specialists for further evaluation and treatment. When possible, include family members and significant others in education/counseling sessions.    Expected Outcomes Short Term: Participant demonstrates changes in health-related behavior, relaxation and other stress management skills, ability to obtain effective social support, and compliance with psychotropic medications if prescribed.;Long Term: Emotional wellbeing is indicated by absence of clinically significant psychosocial distress or social isolation.             Core Components/Risk Factors/Patient Goals Review:   Goals and Risk Factor Review     Row Name 02/17/23 1538             Core Components/Risk Factors/Patient Goals Review   Personal Goals Review Weight Management/Obesity;Hypertension;Lipids       Review Elijah Birk is doing well with exercise at cardiac rehab. Heart rate variable due to AFib. Elijah Birk has been increasing his worloads and has lost 4.4. kg since starting cardiac rehab       Expected Outcomes Elijah Birk will continue to participate in cardiac rehab for exercise, nutrition , lifestyle modifications.                Core Components/Risk Factors/Patient Goals at Discharge (Final Review):  Goals and Risk Factor Review - 02/17/23 1538       Core Components/Risk Factors/Patient Goals Review   Personal Goals Review Weight Management/Obesity;Hypertension;Lipids    Review Elijah Birk is doing well with exercise at cardiac rehab. Heart rate variable due to AFib. Elijah Birk has been increasing his worloads and has lost 4.4. kg since starting cardiac rehab    Expected Outcomes Elijah Birk will  continue to participate in cardiac rehab for exercise, nutrition , lifestyle modifications.             ITP Comments:  ITP Comments     Row Name 01/22/23 0930 02/17/23 1537         ITP Comments Armanda Magic, MD: Medical Director.  Patient introduced to the [facility] Education Program/Intensive Cardiac Rehab.  Initial orientation packet reviewed with the patient today. Elijah Birk has good attendance and participation in cardiac rehab/               Comments: See ITP comments.Thayer Headings RN BSN

## 2023-02-18 ENCOUNTER — Encounter (HOSPITAL_COMMUNITY)
Admission: RE | Admit: 2023-02-18 | Discharge: 2023-02-18 | Disposition: A | Discharge status: Home / Self Care | Payer: MEDICARE | Source: Ambulatory Visit | Attending: Cardiology | Admitting: Cardiology

## 2023-02-18 DIAGNOSIS — Z952 Presence of prosthetic heart valve: Secondary | ICD-10-CM | POA: Diagnosis not present

## 2023-02-18 NOTE — Progress Notes (Signed)
Robert [name] showed his BNP result. Denies shortness of breath. Oxygen saturation 97% on room air. I asked Georgie Chard NP if Mr Chapla is okay to proceed with exercise. good morning Noreene Larsson Mr Volmar showed [place] his BNP results. his sao2 is 97% RA is he okay to exercise?  4 mins JM Filbert Schilder, NP yes   Robert [name] exercised without difficulty.Thayer Headings RN BSN

## 2023-02-19 NOTE — Telephone Encounter (Signed)
Pt just called in to let you know he received Entresto and started taking it last night.

## 2023-02-20 ENCOUNTER — Encounter (HOSPITAL_COMMUNITY)
Admission: RE | Admit: 2023-02-20 | Discharge: 2023-02-20 | Disposition: A | Discharge status: Home / Self Care | Payer: MEDICARE | Source: Ambulatory Visit | Attending: Cardiology | Admitting: Cardiology

## 2023-02-20 ENCOUNTER — Ambulatory Visit: Payer: MEDICARE

## 2023-02-20 DIAGNOSIS — Z7901 Long term (current) use of anticoagulants: Secondary | ICD-10-CM

## 2023-02-20 DIAGNOSIS — Z952 Presence of prosthetic heart valve: Secondary | ICD-10-CM

## 2023-02-20 LAB — POCT INR: INR: 3.9 — AB (ref 2.0–3.0)

## 2023-02-20 NOTE — Patient Instructions (Addendum)
Pre visit review using our clinic review tool, if applicable. No additional management support is needed unless otherwise documented below in the visit note.  Hold dose today and then change weekly dosing to take 1/2 tablet daily except take 1 tablet Fridays.  Recheck in 3 weeks.

## 2023-02-20 NOTE — Progress Notes (Signed)
Increase in fruit in diet; eating a lot of blueberries. Started taking Entresto.   Hold dose today and then change weekly dosing to take 1/2 tablet daily except take 1 tablet Fridays.  Recheck in 3 weeks.

## 2023-02-24 ENCOUNTER — Other Ambulatory Visit: Payer: Self-pay | Admitting: Internal Medicine

## 2023-02-24 DIAGNOSIS — Z7901 Long term (current) use of anticoagulants: Secondary | ICD-10-CM

## 2023-02-25 ENCOUNTER — Encounter (HOSPITAL_COMMUNITY)
Admission: RE | Admit: 2023-02-25 | Discharge: 2023-02-25 | Disposition: A | Discharge status: Home / Self Care | Payer: MEDICARE | Source: Ambulatory Visit | Attending: Cardiology | Admitting: Cardiology

## 2023-02-25 DIAGNOSIS — Z952 Presence of prosthetic heart valve: Secondary | ICD-10-CM | POA: Diagnosis not present

## 2023-02-25 DIAGNOSIS — Z48812 Encounter for surgical aftercare following surgery on the circulatory system: Secondary | ICD-10-CM | POA: Insufficient documentation

## 2023-02-27 ENCOUNTER — Encounter (HOSPITAL_COMMUNITY)
Admission: RE | Admit: 2023-02-27 | Discharge: 2023-02-27 | Disposition: A | Discharge status: Home / Self Care | Payer: MEDICARE | Source: Ambulatory Visit | Attending: Cardiology

## 2023-02-27 DIAGNOSIS — Z952 Presence of prosthetic heart valve: Secondary | ICD-10-CM

## 2023-02-27 DIAGNOSIS — Z48812 Encounter for surgical aftercare following surgery on the circulatory system: Secondary | ICD-10-CM | POA: Diagnosis not present

## 2023-02-27 NOTE — Telephone Encounter (Signed)
Pt is compliant with warfarin management and PCP apts.  Sent in refill of warfarin to requested pharmacy.      

## 2023-02-27 NOTE — Progress Notes (Signed)
Reviewed home exercise Rx with patient today.  Encouraged warm-up, cool-down, and stretching. Reviewed THRR of 60-119 and keeping RPE between 11-13. Encouraged to hydrate with activity.  Reviewed weather parameters for temperature and humidity for safe exercise outdoors. Reviewed S/S to terminate exercise and when to call 911 vs MD. Pt encouraged to always carry a cell phone for safety when exercising outdoors. Pt verbalized understanding of the home exercise Rx and was provided a copy.  Lorin Picket MS, ACSM-CEP, CCRP

## 2023-03-02 ENCOUNTER — Encounter (HOSPITAL_COMMUNITY)
Admission: RE | Admit: 2023-03-02 | Discharge: 2023-03-02 | Disposition: A | Discharge status: Home / Self Care | Payer: MEDICARE | Source: Ambulatory Visit | Attending: Cardiology | Admitting: Cardiology

## 2023-03-02 DIAGNOSIS — Z48812 Encounter for surgical aftercare following surgery on the circulatory system: Secondary | ICD-10-CM | POA: Diagnosis not present

## 2023-03-02 DIAGNOSIS — Z952 Presence of prosthetic heart valve: Secondary | ICD-10-CM | POA: Diagnosis not present

## 2023-03-04 ENCOUNTER — Other Ambulatory Visit: Payer: Self-pay | Admitting: Internal Medicine

## 2023-03-04 ENCOUNTER — Encounter (HOSPITAL_COMMUNITY)
Admission: RE | Admit: 2023-03-04 | Discharge: 2023-03-04 | Disposition: A | Discharge status: Home / Self Care | Payer: MEDICARE | Source: Ambulatory Visit | Attending: Cardiology | Admitting: Cardiology

## 2023-03-04 DIAGNOSIS — Z952 Presence of prosthetic heart valve: Secondary | ICD-10-CM

## 2023-03-04 DIAGNOSIS — Z48812 Encounter for surgical aftercare following surgery on the circulatory system: Secondary | ICD-10-CM | POA: Diagnosis not present

## 2023-03-06 ENCOUNTER — Encounter (HOSPITAL_COMMUNITY)
Admission: RE | Admit: 2023-03-06 | Discharge: 2023-03-06 | Disposition: A | Discharge status: Home / Self Care | Payer: MEDICARE | Source: Ambulatory Visit | Attending: Cardiology | Admitting: Cardiology

## 2023-03-06 ENCOUNTER — Encounter (HOSPITAL_BASED_OUTPATIENT_CLINIC_OR_DEPARTMENT_OTHER): Payer: Self-pay | Admitting: Cardiology

## 2023-03-06 ENCOUNTER — Ambulatory Visit (INDEPENDENT_AMBULATORY_CARE_PROVIDER_SITE_OTHER): Payer: MEDICARE | Admitting: Cardiology

## 2023-03-06 VITALS — BP 108/66 | HR 97 | Ht 68.0 in | Wt 294.8 lb

## 2023-03-06 DIAGNOSIS — I350 Nonrheumatic aortic (valve) stenosis: Secondary | ICD-10-CM | POA: Diagnosis not present

## 2023-03-06 DIAGNOSIS — Z79899 Other long term (current) drug therapy: Secondary | ICD-10-CM | POA: Diagnosis not present

## 2023-03-06 DIAGNOSIS — D6869 Other thrombophilia: Secondary | ICD-10-CM

## 2023-03-06 DIAGNOSIS — Z952 Presence of prosthetic heart valve: Secondary | ICD-10-CM

## 2023-03-06 DIAGNOSIS — I5032 Chronic diastolic (congestive) heart failure: Secondary | ICD-10-CM | POA: Diagnosis not present

## 2023-03-06 DIAGNOSIS — I10 Essential (primary) hypertension: Secondary | ICD-10-CM | POA: Diagnosis not present

## 2023-03-06 DIAGNOSIS — I4821 Permanent atrial fibrillation: Secondary | ICD-10-CM | POA: Diagnosis not present

## 2023-03-06 DIAGNOSIS — Z48812 Encounter for surgical aftercare following surgery on the circulatory system: Secondary | ICD-10-CM | POA: Diagnosis not present

## 2023-03-06 DIAGNOSIS — I428 Other cardiomyopathies: Secondary | ICD-10-CM

## 2023-03-06 MED ORDER — DIGOXIN 125 MCG PO TABS
0.1250 mg | ORAL_TABLET | Freq: Every day | ORAL | 3 refills | Status: DC
Start: 2023-03-06 — End: 2024-02-10

## 2023-03-06 NOTE — Progress Notes (Signed)
Cardiology Office Note:  .    Date:  03/06/2023  ID:  Robert [name], DOB August 19, 1950, MRN [id number] PCP: Tresa Garter, MD  [facility] Providers Cardiologist:  Jodelle Red, MD     History of Present Illness: .    Robert [name] is a 72 y.o. male with a hx of severe AS s/p TAVR 12/2022, severe Covid infection requiring prolonged hospitalization, atrial fibrillation (permanent, started with Covid), prior DVT/PE, dyslipidemia, obesity, paroxysmal SVT, who is seen in follow up.  He was previously seen by cardiology in 04/2014 by Dr. Eden Emms. His initial consult with me was on 04/23/18. Please see that note for full review of his history.   At his visit 11/2022, we reviewed his testing results. Echo with severe aortic stenosis. PET without ischemia. Discussed that he will likely need a CT TAVR and cath for further evaluation before determining what he might be a candidate for.   Right heart cath 12/16/22 showed mid LAD with 40% stenosis, and 20% stenosis of Ost RCA to proximal RCA, mild obstructive CAD. He is now status post TAVR 01/06/2023 with a 34 mm Medtronic FX THV via the TF approach. Post op echo showed EF 50%, normally functioning TAVR with a mean gradient of 7 mmHg and no PVL, valve noted to be placed somewhat deep. Resumed on Coumadin.   He was seen by Georgie Chard, NP on 02/16/2023 and complained of significant fatigue. He had lost about 14 lbs since his prior visit 12/2022. He was in Afib with heart rates in the 90s-100s. He was started on Entresto 24-26 mg.  Today, he is accompanied by a family member. He notes that he had reverted to NSR for a short time but his Afib has been persistent. We discussed rhythm control strategies at length, amenable to meeting with EP for further recommendations.  He is participating in intensive cardiac rehab. He complains of his legs becoming fatigued quickly. While exercising he has been working up to 150 steps per minute, which  wears him out due to his knees. Still has some tenderness in his right ankle. Recently had elevated heart rates at cardiac rehab, normalizes with resting and elevating his legs. The other day, his heart rate was consistently 90s-100s for several hours after working outside. He uses walking sticks to assist with walking and posture correction. His blood pressures have been consistently stable and/or low. In the office his BP is 108/66.  For an hour after taking Entresto he will experience side effects of dizziness.  He denies any palpitations, chest pain, shortness of breath, headaches, syncope, orthopnea, or PND.  ROS:  Please see the history of present illness. ROS otherwise negative except as noted.  (+) Fatigue (+) Bilateral knee pain (+) Right ankle tenderness (+) Dizziness  Studies Reviewed: [name]    EKG Interpretation Date/Time:  Friday March 06 2023 09:16:51 EDT Ventricular Rate:  97 PR Interval:    QRS Duration:  142 QT Interval:  394 QTC Calculation: 500 R Axis:   115  Text Interpretation: Atrial fibrillation Non-specific intra-ventricular conduction block Cannot rule out Anterior infarct , age undetermined Confirmed by Jodelle Red [phone]) on 03/06/2023 9:32:24 AM    Echo  02/16/2023:  1. Left ventricular ejection fraction, by estimation, is 30 to 35%. The  left ventricle has moderately decreased function. The left ventricle  demonstrates global hypokinesis. Left ventricular diastolic function could  not be evaluated. There is  incordinate septal motion.   2. Right ventricular systolic  function is mildly reduced. The right  ventricular size is normal. There is normal pulmonary artery systolic  pressure. The estimated right ventricular systolic pressure is 34.6 mmHg.   3. The mitral valve is abnormal. Mild mitral valve regurgitation.   4. The aortic valve has been repaired/replaced. Aortic valve  regurgitation is not visualized. There is a 34 Medtronic  CoreValve-Evolut  Pro prosthetic (TAVR) valve present in the aortic position. Procedure  Date: 01/06/2023. Echo findings are consistent  with normal structure and function of the aortic valve prosthesis. Aortic  valve area, by VTI measures 2.23 cm. Aortic valve mean gradient measures  9.2 mmHg. Aortic valve Vmax measures 2.09 m/s.   5. The inferior vena cava is normal in size with greater than 50%  respiratory variability, suggesting right atrial pressure of 3 mmHg.   6. Rhythm strip during this exam demonstrates atrial fibrillation.   Comparison(s): Changes from prior study are noted. 01/07/2023: LVEF 50-55%,  TAVR mean gradient 7 mmHg, no PVL. Compared to the prior study, there has  been a decline in LV systolic function.   TAVR Operative Note Date of Procedure:                01/06/2023    Preoperative Diagnosis:      Severe Aortic Stenosis    Postoperative Diagnosis:    Same    Procedure:        Transcatheter Aortic Valve Replacement - Percutaneous  Transfemoral Approach             Medtronic Evolut FX (size 34 mm, serial # 7 )              Co-Surgeons:                        Eugenio Hoes, MD, and Alverda Skeans, MD   Anesthesiologist:                  Lewie Loron, MD   Echocardiographer:              Charlton Haws, MD   Pre-operative Echo Findings:  Severe aortic stenosis Norma left ventricular systolic function   Post-operative Echo Findings: No paravalvular leak Normal left ventricular systolic function   Left Heart Catheterization: LVEDP  Physical Exam:    VS:  BP 108/66   Pulse 97   Ht 5\' 8"  (1.727 m)   Wt 294 lb 12.8 oz (133.7 kg)   SpO2 94%   BMI 44.82 kg/m    Wt Readings from Last 3 Encounters:  03/06/23 294 lb 12.8 oz (133.7 kg)  02/16/23 (!) 300 lb 9.6 oz (136.4 kg)  01/27/23 (!) 314 lb (142.4 kg)    GEN: Well nourished, well developed in no acute distress HEENT: Normal, moist mucous membranes NECK: No JVD CARDIAC: Irregularly irregular  rhythm, normal S1 and S2, no rubs or gallops. No murmur. VASCULAR: Radial and DP pulses 2+ bilaterally. No carotid bruits RESPIRATORY:  Clear to auscultation without rales, wheezing or rhonchi  ABDOMEN: Soft, non-tender, non-distended MUSCULOSKELETAL:  Ambulates independently SKIN: Warm and dry, chronic brawny mild LE edema NEUROLOGIC:  Alert and oriented x 3. No focal neuro deficits noted. PSYCHIATRIC:  Normal affect   ASSESSMENT AND PLAN: .    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   Permanent atrial fibrillation -began during his Covid illness -given limitations/sensitivities to medications, limited options. Tolerated amiodarone as a rate control agent in the past, but had side effects of hand swelling and thyroid abnormalities. Cannot tolerate diltiazem or beta blockers. -discussed goals for heart rates. Limited choices. Will refer back to EP to see if he is a candidate for ablation (has chronic R femoral stenosis). Last seen by Dr. Johney Frame in 2020, prior to his afib diagnosis (eval was for SVT at that time) -will start digoxin after shared decision making. Check level before next visit, instructed not to take AM dose of digoxin the morning he has labs and to then take the dose after his labs are drawn, to avoid inaccuracy. -CHA2DS2/VAS Stroke Risk Points=4  -on long term coumadin -we have discussion cardioversion in the past, declined   Hypertension:  -at goal today   History of DVT/PE with chronic venous insufficiency, on long term anticoagulation with coumadin:  -No changes, not interested in DOAC, continue coumadin therapy.  -Uses compression stocking for insufficiency   Dyslipidemia Nonobstructive CAD Peripheral vascular  disease -We have discussed at length options for management. Declines statin and other medications. He prefers to stay on OTC fish oil. Has significantly elevated ASCVD risk, below. Discussed that OTC fish oil not recommended from a cardiovascular perspective.   Obesity: BMI ~44. Aware that weight loss would be beneficial, but he feels very limited in his ability to do this due to his chronic medical conditions.   CV risk counseling and prevention -recommend heart healthy/Mediterranean diet, with whole grains, fruits, vegetable, fish, lean meats, nuts, and olive oil. Limit salt. -recommend moderate walking, 3-5 times/week for 30-50 minutes each session. Aim for at least 150 minutes.week. Goal should be pace of 3 miles/hours, or walking 1.5 miles in 30 minutes -recommend avoidance of tobacco products. Avoid excess alcohol.  Dispo: Follow-up with APP in 1 month, or sooner as needed.  Total time of encounter: 44 minutes total time of encounter, including 38 minutes spent in face-to-face patient care. This time includes coordination of care and counseling regarding above conditions. Remainder of non-face-to-face time involved reviewing chart documents/testing relevant to the patient encounter and documentation in the medical record.  Jodelle Red, MD, PhD, [facility] [place]  [facility]    I,Mathew Stumpf,acting as a scribe for Jodelle Red, MD.,have documented all relevant documentation on the behalf of Jodelle Red, MD,as directed by  Jodelle Red, MD while in the presence of Jodelle Red, MD.  I, Jodelle Red, MD, have reviewed all documentation for this visit. The documentation on 03/06/23 for the exam, diagnosis, procedures, and orders are all accurate and complete.   Signed, Jodelle Red, MD

## 2023-03-06 NOTE — Patient Instructions (Addendum)
Medication Instructions:  START- Digoxin 0.125 mg by mouth daily  *If you need a refill on your cardiac medications before your next appointment, please call your pharmacy*   Lab Work: BMP Digoxin in 3 weeks, do not take Digoxin the morning of blood work.  If you have labs (blood work) drawn today and your tests are completely normal, you will receive your results only by: MyChart Message (if you have MyChart) OR A paper copy in the mail If you have any lab test that is abnormal or we need to change your treatment, we will call you to review the results.   Testing/Procedures: Your physician has requested that you have an echocardiogram. Echocardiography is a painless test that uses sound waves to create images of your heart. It provides your doctor with information about the size and shape of your heart and how well your heart's chambers and valves are working. This procedure takes approximately one hour. There are no restrictions for this procedure. Please do NOT wear cologne, perfume, aftershave, or lotions (deodorant is allowed). Please arrive 15 minutes prior to your appointment time.   Follow-Up: At [facility], you and your health needs are our priority.  As part of our continuing mission to provide you with exceptional heart care, we have created designated Provider Care Teams.  These Care Teams include your primary Cardiologist (physician) and Advanced Practice Providers (APPs -  Physician Assistants and Nurse Practitioners) who all work together to provide you with the care you need, when you need it.  We recommend signing up for the patient portal called "MyChart".  Sign up information is provided on this After Visit Summary.  MyChart is used to connect with patients for Virtual Visits (Telemedicine).  Patients are able to view lab/test results, encounter notes, upcoming appointments, etc.  Non-urgent messages can be sent to your provider as well.   To learn more about  what you can do with MyChart, go to [email or web address].    Your next appointment:   1 month(s)  Provider:   Jodelle Red, MD  or Gillian Shields, NP  Other Instructions You have been referred to Electrophysiology

## 2023-03-09 ENCOUNTER — Encounter (HOSPITAL_COMMUNITY)
Admission: RE | Admit: 2023-03-09 | Discharge: 2023-03-09 | Disposition: A | Discharge status: Home / Self Care | Payer: MEDICARE | Source: Ambulatory Visit | Attending: Cardiology | Admitting: Cardiology

## 2023-03-09 ENCOUNTER — Encounter (HOSPITAL_BASED_OUTPATIENT_CLINIC_OR_DEPARTMENT_OTHER): Payer: Self-pay

## 2023-03-09 DIAGNOSIS — Z79899 Other long term (current) drug therapy: Secondary | ICD-10-CM | POA: Diagnosis not present

## 2023-03-09 DIAGNOSIS — Z952 Presence of prosthetic heart valve: Secondary | ICD-10-CM

## 2023-03-09 DIAGNOSIS — Z48812 Encounter for surgical aftercare following surgery on the circulatory system: Secondary | ICD-10-CM | POA: Diagnosis not present

## 2023-03-09 LAB — BASIC METABOLIC PANEL
BUN/Creatinine Ratio: 14 (ref 10–24)
BUN: 17 mg/dL (ref 8–27)
CO2: 24 mmol/L (ref 20–29)
Calcium: 9.8 mg/dL (ref 8.6–10.2)
Chloride: 97 mmol/L (ref 96–106)
Creatinine, Ser: 1.24 mg/dL (ref 0.76–1.27)
Glucose: 107 mg/dL — ABNORMAL HIGH (ref 70–99)
Potassium: 5.1 mmol/L (ref 3.5–5.2)
Sodium: 136 mmol/L (ref 134–144)
eGFR: 62 mL/min/{1.73_m2} (ref >59)

## 2023-03-11 ENCOUNTER — Encounter (HOSPITAL_COMMUNITY)
Admission: RE | Admit: 2023-03-11 | Discharge: 2023-03-11 | Disposition: A | Discharge status: Home / Self Care | Payer: MEDICARE | Source: Ambulatory Visit | Attending: Cardiology | Admitting: Cardiology

## 2023-03-11 DIAGNOSIS — Z48812 Encounter for surgical aftercare following surgery on the circulatory system: Secondary | ICD-10-CM | POA: Diagnosis not present

## 2023-03-11 DIAGNOSIS — Z952 Presence of prosthetic heart valve: Secondary | ICD-10-CM

## 2023-03-13 ENCOUNTER — Ambulatory Visit (INDEPENDENT_AMBULATORY_CARE_PROVIDER_SITE_OTHER): Payer: MEDICARE

## 2023-03-13 ENCOUNTER — Encounter (HOSPITAL_COMMUNITY)
Admission: RE | Admit: 2023-03-13 | Discharge: 2023-03-13 | Disposition: A | Discharge status: Home / Self Care | Payer: MEDICARE | Source: Ambulatory Visit | Attending: Cardiology

## 2023-03-13 DIAGNOSIS — Z7901 Long term (current) use of anticoagulants: Secondary | ICD-10-CM

## 2023-03-13 DIAGNOSIS — Z952 Presence of prosthetic heart valve: Secondary | ICD-10-CM

## 2023-03-13 DIAGNOSIS — Z48812 Encounter for surgical aftercare following surgery on the circulatory system: Secondary | ICD-10-CM | POA: Diagnosis not present

## 2023-03-13 LAB — POCT INR: INR: 2.1 (ref 2.0–3.0)

## 2023-03-13 NOTE — Progress Notes (Addendum)
Pt started digoxin on 9/13. No interaction with warfarin. Continue 1/2 tablet daily except take 1 tablet Fridays.  Recheck in 4 weeks.

## 2023-03-13 NOTE — Patient Instructions (Addendum)
Pre visit review using our clinic review tool, if applicable. No additional management support is needed unless otherwise documented below in the visit note.  Continue 1/2 tablet daily except take 1 tablet Fridays.  Recheck in 4 weeks.

## 2023-03-16 ENCOUNTER — Encounter (HOSPITAL_COMMUNITY)
Admission: RE | Admit: 2023-03-16 | Discharge: 2023-03-16 | Disposition: A | Discharge status: Home / Self Care | Payer: MEDICARE | Source: Ambulatory Visit | Attending: Cardiology

## 2023-03-16 DIAGNOSIS — Z952 Presence of prosthetic heart valve: Secondary | ICD-10-CM

## 2023-03-16 DIAGNOSIS — Z48812 Encounter for surgical aftercare following surgery on the circulatory system: Secondary | ICD-10-CM | POA: Diagnosis not present

## 2023-03-17 NOTE — Progress Notes (Signed)
Cardiac Individual Treatment Plan  Patient Details  Name: Robert [name] MRN: [id number] Date of Birth: 07/23/1950 Referring Provider:   Flowsheet Row INTENSIVE CARDIAC REHAB ORIENT from 01/22/2023 in [facility]  Referring Provider Jodelle Red, MD       Initial Encounter Date:  Flowsheet Row INTENSIVE CARDIAC REHAB ORIENT from 01/22/2023 in [facility]  Date 01/22/23       Visit Diagnosis: 01/06/23 S/P TAVR (transcatheter aortic valve replacement)  Patient's Home Medications on Admission:  Current Outpatient Medications:    albuterol (VENTOLIN HFA) 108 (90 Base) MCG/ACT inhaler, Inhale 2 puffs into the lungs every 6 (six) hours as needed for wheezing or shortness of breath., Disp: 6.7 g, Rfl: 0   allopurinol (ZYLOPRIM) 100 MG tablet, Take 1 tablet by mouth once daily, Disp: 90 tablet, Rfl: 0   celecoxib (CELEBREX) 200 MG capsule, Take 1 capsule (200 mg total) by mouth 2 (two) times daily. (Patient taking differently: Take 200 mg by mouth as needed for moderate pain.), Disp: 14 capsule, Rfl: 0   cetirizine (ZYRTEC) 10 MG tablet, Take 1 tablet by mouth once daily (Patient taking differently: Take 10 mg by mouth at bedtime.), Disp: 30 tablet, Rfl: 5   Cholecalciferol (VITAMIN D3) 125 MCG (5000 UT) CAPS, Take 1 capsule (5,000 Units total) by mouth daily. (Patient taking differently: Take 1 capsule by mouth as needed.), Disp: 100 capsule, Rfl: 3   colchicine 0.6 MG tablet, TAKE 2 TABLETS BY MOUTH AS NEEDED FOR  GOUT  ATTACK  THEN  TAKE  ANOTHER  ONE  TABLET  IN  1  TO  2  HOURS.  DO  NOT  REPEAT  FOR  3  DAYS., Disp: 18 tablet, Rfl: 0   digoxin (LANOXIN) 0.125 MG tablet, Take 1 tablet (0.125 mg total) by mouth daily., Disp: 90 tablet, Rfl: 3   doxycycline (MONODOX) 100 MG capsule, Take 1 capsule (100 mg total) by mouth as directed. 1 hour before any dental work,  including cleanings., Disp: 6 capsule, Rfl: 6   famotidine (PEPCID) 40 MG tablet, Take 1 tablet by mouth once daily (Patient taking differently: Take 40 mg by mouth daily as needed for heartburn or indigestion.), Disp: 30 tablet, Rfl: 5   faricimab-svoa (VABYSMO) 6 MG/0.05ML SOLN intravitreal injection, 6 mg by Intravitreal route once. Injections every 8 weeks, Disp: , Rfl:    fish oil-omega-3 fatty acids 1000 MG capsule, Take 1 g by mouth every morning., Disp: , Rfl:    furosemide (LASIX) 20 MG tablet, Take 20 mg by mouth 2 (two) times daily., Disp: , Rfl:    glucosamine-chondroitin 500-400 MG tablet, Take 1 tablet by mouth every morning., Disp: , Rfl:    mupirocin ointment (BACTROBAN) 2 %, Place 1 Application into the nose as needed., Disp: , Rfl:    nortriptyline (PAMELOR) 25 MG capsule, TAKE 1 CAPSULE AT BEDTIME, Disp: 90 capsule, Rfl: 3   predniSONE (DELTASONE) 5 MG tablet, TAKE 1 TABLET DAILY WITH BREAKFAST, Disp: 90 tablet, Rfl: 3   pregabalin (LYRICA) 75 MG capsule, Take 1 capsule by mouth three times daily as needed (Patient taking differently: as needed.), Disp: 90 capsule, Rfl: 1   sacubitril-valsartan (ENTRESTO) 24-26 MG, Take 1 tablet by mouth 2 (two) times daily., Disp: 60 tablet, Rfl: 2   triamcinolone cream (KENALOG) 0.1 %, Apply 1 Application topically 3 (three) times daily. (Patient taking differently: Apply  1 Application topically daily as needed (irritation).), Disp: 450 g, Rfl: 1   warfarin (COUMADIN) 3 MG tablet, TAKE 1 TABLET DAILY OR TAKE AS DIRECTED BY ANTICOAGULATION CLINIC (Patient taking differently: Take 3 mg by mouth See admin instructions. TAKE 1 TABLET DAILY OR TAKE AS DIRECTED BY ANTICOAGULATION CLINIC; 6 mg (6 mg x 1) every Fri; 3 mg (6 mg x 0.5) all other days), Disp: 90 tablet, Rfl: 1   warfarin (COUMADIN) 6 MG tablet, TAKE ONE-HALF (1/2) TABLET DAILY EXCEPT TAKE 1 TABLET ON FRIDAYS OR AS DIRECTED BY ANTICOAGULATION CLINIC, Disp: 60 tablet, Rfl: 1  Past Medical  History: Past Medical History:  Diagnosis Date   Anxiety    Asthma    "no attack since acupuncture in 1990's"   Atrial fibrillation (HCC)    Complication of anesthesia    SLOW TO WAKE UP / DIFFICULTY RESPONDING 2003, 3 days before could use left arm, "wild/crazy sometimes"   COPD (chronic obstructive pulmonary disease) (HCC)    Cyst of left kidney    "benign"   DVT (deep venous thrombosis) (HCC) 06/23/2001   right femoral artery "from groin to knee"   Edema    Right Leg w/ h/o DVT postphlebitic   H/O blood transfusion reaction 06/23/2002   FFP, extreme swelling, could not breath   History of pulmonary embolism 06/23/2001   both lungs   Hyperlipidemia    Knee pain    left   LBP (low back pain)    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 Lynnette Caffey and Dr. Leafy Ro   Severe aortic stenosis    Spondylolisthesis    Warfarin anticoagulation    Wheezing    when laying on left side    Tobacco Use: Social History   Tobacco Use  Smoking Status Never  Smokeless Tobacco Never    Labs: Review Flowsheet  More data exists      Latest Ref Rng & Units 04/04/2021 11/12/2021 03/17/2022 12/16/2022 01/06/2023  Labs for ITP Cardiac and Pulmonary Rehab  Cholestrol 0 - 200 mg/dL - - 355  - -  LDL (calc) 0 - 99 mg/dL - - 732  - -  HDL-C >20.25 mg/dL - - 42.70  - -  Trlycerides 0.0 - 149.0 mg/dL - - 623.7  - -  Hemoglobin A1c 4.6 - 6.5 % 6.1  5.8  - - -  PH, Arterial 7.35 - 7.45 - - - 7.376  -  PCO2 arterial 32 - 48 mmHg - - - 39.5  -  Bicarbonate 20.0 - 28.0 mmol/L - - - 25.8  25.2  23.1  -  TCO2 22 - 32 mmol/L - - - 27  27  24   32   Acid-base deficit 0.0 - 2.0 mmol/L - - - 1.0  2.0  -  O2 Saturation % - - - 74  76  96  -    Details       Multiple  values from one day are sorted in reverse-chronological order         Capillary Blood Glucose: Lab Results  Component Value Date   GLUCAP 231 (H) 04/05/2013   GLUCAP 132 (H) 04/05/2013   GLUCAP 110 (H)  05/21/2010     Exercise Target Goals: Exercise Program Goal: Individual exercise prescription set using results from initial 6 min walk test and THRR while considering  patient's activity barriers and safety.   Exercise Prescription Goal: Initial exercise prescription builds to 30-45 minutes a day of aerobic activity, 2-3 days per week.  Home exercise guidelines will be given to patient during program as part of exercise prescription that the participant will acknowledge.  Activity Barriers & Risk Stratification:  Activity Barriers & Cardiac Risk Stratification - 01/22/23 1231       Activity Barriers & Cardiac Risk Stratification   Activity Barriers Arthritis;Back Problems;Joint Problems;Deconditioning;Muscular Weakness;Shortness of Breath;Balance Concerns    Cardiac Risk Stratification High             6 Minute Walk:  6 Minute Walk     Row Name 01/22/23 1126         6 Minute Walk   Phase Initial     Distance 480 feet     Walk Time 6 minutes     # of Rest Breaks 2  2 breaks totaling 2:07 due to going over [facility]     MPH 1     METS 1.7     RPE 9     Perceived Dyspnea  1     VO2 Peak 1.03     Symptoms Yes (comment)     Comments SOB, RPD = 1     Resting HR 81 bpm     Resting BP 112/74     Resting Oxygen Saturation  95 %     Exercise Oxygen Saturation  during 6 min walk 94 %     Max Ex. HR 134 bpm     Max Ex. BP 144/78     2 Minute Post BP 124/80       Interval HR   1 Minute HR 114     2 Minute HR 129     3 Minute HR 114     4 Minute HR 112     5 Minute HR 126     6 Minute HR 114     2 Minute Post HR 87     Interval Heart Rate? Yes       Interval Oxygen   Interval Oxygen? Yes     Baseline Oxygen Saturation % 95 %     1 Minute Oxygen Saturation % 97 %      1 Minute Liters of Oxygen 0 L     2 Minute Oxygen Saturation % 94 %     2 Minute Liters of Oxygen 0 L     3 Minute Oxygen Saturation % 96 %     3 Minute Liters of Oxygen 0 L     4 Minute Oxygen Saturation % 96 %     4 Minute Liters of Oxygen 0 L     5 Minute Oxygen Saturation % 95 %     5 Minute Liters of Oxygen 0 L     6 Minute Oxygen Saturation % 97 %     6 Minute Liters of Oxygen 0 L              Oxygen Initial Assessment:   Oxygen Re-Evaluation:   Oxygen Discharge (Final Oxygen Re-Evaluation):   Initial Exercise Prescription:  Initial Exercise Prescription - 01/22/23 1200       Date of Initial Exercise RX and Referring Provider   Date 01/22/23    Referring Provider  Jodelle Red, MD    Expected Discharge Date 04/15/23      T5 Nustep   Level 1    SPM 70    Minutes 25    METs 1.7      Prescription Details   Frequency (times per week) 3    Duration Progress to 30 minutes of continuous aerobic without signs/symptoms of physical distress      Intensity   THRR 40-80% of Max Heartrate 60-119    Ratings of Perceived Exertion 11-13    Perceived Dyspnea 0-4      Progression   Progression Continue progressive overload as per policy without signs/symptoms or physical distress.      Resistance Training   Training Prescription Yes    Weight 3 lbs    Reps 10-15             Perform Capillary Blood Glucose checks as needed.  Exercise Prescription Changes:   Exercise Prescription Changes     Row Name 01/26/23 1600 02/18/23 1400 02/26/23 1500 02/27/23 1400       Response to Exercise   Blood Pressure (Admit) 114/78 140/68 114/76 118/68    Blood Pressure (Exercise) 122/78 138/80 134/80 134/68    Blood Pressure (Exit) 108/70 112/70 122/70 100/62    Heart Rate (Admit) 101 bpm 107 bpm 90 bpm 100 bpm    Heart Rate (Exercise) 147 bpm 159 bpm 115 bpm 183 bpm    Heart Rate (Exit) 105 bpm 113 bpm 105 bpm 109 bpm    Rating of Perceived Exertion  (Exercise) 9 11 13 11     Symptoms None None None None    Comments Pt's first day in the CRP2 program. Reviewed METs Reviewed METs and goals Reviewed Home exercise Rx    Duration Progress to 30 minutes of  aerobic without signs/symptoms of physical distress Continue with 30 min of aerobic exercise without signs/symptoms of physical distress. Progress to 30 minutes of  aerobic without signs/symptoms of physical distress Continue with 30 min of aerobic exercise without signs/symptoms of physical distress.    Intensity THRR unchanged THRR unchanged THRR unchanged THRR unchanged      Progression   Progression Continue to progress workloads to maintain intensity without signs/symptoms of physical distress. Continue to progress workloads to maintain intensity without signs/symptoms of physical distress. Continue to progress workloads to maintain intensity without signs/symptoms of physical distress. Continue to progress workloads to maintain intensity without signs/symptoms of physical distress.    Average METs 2.1 4.2 3.6 3.6      Resistance Training   Training Prescription No No No Yes    Weight Weights held due to increased HR No weights on Wednesdays No weights on Wednesdays 4 lbs    Reps -- -- -- 10-15    Time -- -- -- 10 Minutes      Interval Training   Interval Training No No No Yes      T5 Nustep   Level 1 1 1 1     SPM 80 127 133 132    Minutes 20 30 30 30     METs 2.1 4.2 3.6 3.6      Home Exercise Plan   Plans to continue exercise at -- -- -- Home (comment)    Frequency -- -- -- Add 2 additional days to program exercise sessions.    Initial Home Exercises Provided -- -- -- 02/27/23             Exercise Comments:   Exercise Comments  Row Name 01/26/23 1619 02/18/23 1437 02/26/23 1534 02/27/23 1442     Exercise Comments Pt's first day in the CRP2 program. Pt is very deconditioned. During warm up and and after completing nustep pt was over THRR. Weights were held due to  this reason. Pt will most likely have to do warm-ups, cool-down and weights form the chair until he adjusts to the exercise bout. Reviewed METs. Pt is making good progress on increasing MET level with exercise. Reviewed METs and goals. Pt continues to progess toward goals. Reviewed home exercise Rx. Pt's plan is to walk at home morning and evening, will work up to a total of 15 minutes per walk. will do 2x/week. Verbalized understanding of the home exericse Rx.             Exercise Goals and Review:   Exercise Goals     Row Name 01/22/23 1233             Exercise Goals   Increase Physical Activity Yes       Intervention Provide advice, education, support and counseling about physical activity/exercise needs.;Develop an individualized exercise prescription for aerobic and resistive training based on initial evaluation findings, risk stratification, comorbidities and participant's personal goals.       Expected Outcomes Short Term: Attend rehab on a regular basis to increase amount of physical activity.;Long Term: Add in home exercise to make exercise part of routine and to increase amount of physical activity.;Long Term: Exercising regularly at least 3-5 days a week.       Increase Strength and Stamina Yes       Intervention Provide advice, education, support and counseling about physical activity/exercise needs.;Develop an individualized exercise prescription for aerobic and resistive training based on initial evaluation findings, risk stratification, comorbidities and participant's personal goals.       Expected Outcomes Short Term: Increase workloads from initial exercise prescription for resistance, speed, and METs.;Short Term: Perform resistance training exercises routinely during rehab and add in resistance training at home;Long Term: Improve cardiorespiratory fitness, muscular endurance and strength as measured by increased METs and functional capacity ( )       Able to understand  and use rate of perceived exertion (RPE) scale Yes       Intervention Provide education and explanation on how to use RPE scale       Expected Outcomes Short Term: Able to use RPE daily in rehab to express subjective intensity level;Long Term:  Able to use RPE to guide intensity level when exercising independently       Knowledge and understanding of Target Heart Rate Range (THRR) Yes       Intervention Provide education and explanation of THRR including how the numbers were predicted and where they are located for reference       Expected Outcomes Short Term: Able to state/look up THRR;Short Term: Able to use daily as guideline for intensity in rehab;Long Term: Able to use THRR to govern intensity when exercising independently       Understanding of Exercise Prescription Yes       Intervention Provide education, explanation, and written materials on patient's individual exercise prescription       Expected Outcomes Short Term: Able to explain program exercise prescription;Long Term: Able to explain home exercise prescription to exercise independently                Exercise Goals Re-Evaluation :  Exercise Goals Re-Evaluation     Row Name 01/26/23 1611 02/26/23  1532           Exercise Goal Re-Evaluation   Exercise Goals Review Increase Physical Activity;Able to understand and use Dyspnea scale;Increase Strength and Stamina;Knowledge and understanding of Target Heart Rate Range (THRR);Able to understand and use rate of perceived exertion (RPE) scale Increase Physical Activity;Able to understand and use Dyspnea scale;Increase Strength and Stamina;Knowledge and understanding of Target Heart Rate Range (THRR);Able to understand and use rate of perceived exertion (RPE) scale      Comments Pt's first day in the CRP2 program. Pt understands the exercise Rx, THRR, and RPE scale. Reviewed METs and goals. Pt voices progress on his goal of increased strength and stamina. Pt's goal to improve walking  is a "work in progress". Pt has also had goal of weight loss and has lost 5.5 kg since starting the program.      Expected Outcomes Will continue to monitor patient and progress exercise workloads as tolerated. Will continue to monitor patient and progress exercise workloads as tolerated.               Discharge Exercise Prescription (Final Exercise Prescription Changes):  Exercise Prescription Changes - 02/27/23 1400       Response to Exercise   Blood Pressure (Admit) 118/68    Blood Pressure (Exercise) 134/68    Blood Pressure (Exit) 100/62    Heart Rate (Admit) 100 bpm    Heart Rate (Exercise) 183 bpm    Heart Rate (Exit) 109 bpm    Rating of Perceived Exertion (Exercise) 11    Symptoms None    Comments Reviewed Home exercise Rx    Duration Continue with 30 min of aerobic exercise without signs/symptoms of physical distress.    Intensity THRR unchanged      Progression   Progression Continue to progress workloads to maintain intensity without signs/symptoms of physical distress.    Average METs 3.6      Resistance Training   Training Prescription Yes    Weight 4 lbs    Reps 10-15    Time 10 Minutes      Interval Training   Interval Training Yes      T5 Nustep   Level 1    SPM 132    Minutes 30    METs 3.6      Home Exercise Plan   Plans to continue exercise at Home (comment)    Frequency Add 2 additional days to program exercise sessions.    Initial Home Exercises Provided 02/27/23             Nutrition:  Target Goals: Understanding of nutrition guidelines, daily intake of sodium 1500mg , cholesterol 200mg , calories 30% from fat and 7% or less from saturated fats, daily to have 5 or more servings of fruits and vegetables.  Biometrics:  Pre Biometrics - 01/22/23 1000       Pre Biometrics   Waist Circumference 55.25 inches    Hip Circumference 52 inches    Waist to Hip Ratio 1.06 %    Triceps Skinfold 34 mm    % Body Fat 45.3 %    Grip Strength  34 kg    Flexibility --   Not performed due to significant back issues   Single Leg Stand 1.18 seconds              Nutrition Therapy Plan and Nutrition Goals:  Nutrition Therapy & Goals - 02/26/23 0956       Nutrition Therapy   Diet Heart Healthy  Diet    Drug/Food Interactions Coumadin/Vit K      Personal Nutrition Goals   Nutrition Goal Patient to identify strategies for reducing cardiovascular risk by attending the [facility] education and nutrition series weekly.   goal in action.   Personal Goal #2 Patient to improve diet quality by using the plate method as a guide for meal planning to include lean protein/plant protein, fruits, vegetables, whole grains, nonfat dairy as part of a well-balanced diet.   goal in progress.   Personal Goal #3 Patient to reduce sodium intake to 1500mg  per day   goal in progress.   Personal Goal #4 Patient to identify strategies for weight loss of 0.5-2.0# per week.   goal in progress.   Comments Goals in progress. Robert [name] continues to attend the [facility] education and nutrition series regularly. He has started making many dietary changes including increased dietary fiber, decreased saturated fat intake, and decreased sodium. He is down 12.1# since starting with our program. He continues follow-up with anti-coagulation clinic regularly.  8/26 Cardiology note, "Per review of Dr. Di Kindle last note, he cannot tolerate diltiazem or beta blockers and has declined cardioversion in the past". Patient will benefit from participation in intensive cardiac rehab for nutrition, exercise, and lifestyle modification.      Intervention Plan   Intervention Prescribe, educate and counsel regarding individualized specific dietary modifications aiming towards targeted core components such as weight, hypertension, lipid management, diabetes, heart failure and other comorbidities.;Nutrition handout(s) given to patient.    Expected Outcomes Short Term Goal: Understand basic  principles of dietary content, such as calories, fat, sodium, cholesterol and nutrients.;Long Term Goal: Adherence to prescribed nutrition plan.             Nutrition Assessments:  Nutrition Assessments - 01/26/23 1629       Rate Your Plate Scores   Pre Score 63            MEDIFICTS Score Key: >=70 Need to make dietary changes  40-70 Heart Healthy Diet <= 40 Therapeutic Level Cholesterol Diet   Flowsheet Row INTENSIVE CARDIAC REHAB from 01/26/2023 in [facility]  Picture Your Plate Total Score on Admission 63      Picture Your Plate Scores: <82 Unhealthy dietary pattern with much room for improvement. 41-50 Dietary pattern unlikely to meet recommendations for good health and room for improvement. 51-60 More healthful dietary pattern, with some room for improvement.  >60 Healthy dietary pattern, although there may be some specific behaviors that could be improved.    Nutrition Goals Re-Evaluation:  Nutrition Goals Re-Evaluation     Row Name 01/26/23 1624 02/26/23 0956           Goals   Current Weight 307 lb 15.7 oz (139.7 kg) 298 lb 11.6 oz (135.5 kg)      Comment triglycerides 168, HDL 36, LDL 121, A1c 5.8. He continues to see the anti-coagulation clinic regularly BNP 827, other most recent labs triglycerides 168, HDL 36, LDL 121, A1c 5.8.      Expected Outcome [name] reports that he has started making some dietary changes including increased fish intake and decreased fast food. He is motivated to lose weight and improve eating habits.Patient will benefit from participation in intensive cardiac rehab for nutrition, exercise, and lifestyle modification. Goals in progress. Robert [name] continues to attend the [facility] education and nutrition series regularly. He has started making many dietary changes including increased dietary fiber, decreased saturated fat  intake, and decreased sodium. He is down 12.1# since starting with  our program. He continues follow-up with anti-coagulation clinic regularly. 8/26 Cardiology note, "Per review of Dr. Di Kindle last note, he cannot tolerate diltiazem or beta blockers and has declined cardioversion in the past". Patient will benefit from participation in intensive cardiac rehab for nutrition, exercise, and lifestyle modification.               Nutrition Goals Re-Evaluation:  Nutrition Goals Re-Evaluation     Row Name 01/26/23 1624 02/26/23 0956           Goals   Current Weight 307 lb 15.7 oz (139.7 kg) 298 lb 11.6 oz (135.5 kg)      Comment triglycerides 168, HDL 36, LDL 121, A1c 5.8. He continues to see the anti-coagulation clinic regularly BNP 827, other most recent labs triglycerides 168, HDL 36, LDL 121, A1c 5.8.      Expected Outcome [name] reports that he has started making some dietary changes including increased fish intake and decreased fast food. He is motivated to lose weight and improve eating habits.Patient will benefit from participation in intensive cardiac rehab for nutrition, exercise, and lifestyle modification. Goals in progress. Robert [name] continues to attend the [facility] education and nutrition series regularly. He has started making many dietary changes including increased dietary fiber, decreased saturated fat intake, and decreased sodium. He is down 12.1# since starting with our program. He continues follow-up with anti-coagulation clinic regularly. 8/26 Cardiology note, "Per review of Dr. Di Kindle last note, he cannot tolerate diltiazem or beta blockers and has declined cardioversion in the past". Patient will benefit from participation in intensive cardiac rehab for nutrition, exercise, and lifestyle modification.               Nutrition Goals Discharge (Final Nutrition Goals Re-Evaluation):  Nutrition Goals Re-Evaluation - 02/26/23 0956       Goals   Current Weight 298 lb 11.6 oz (135.5 kg)    Comment BNP 827, other most recent labs  triglycerides 168, HDL 36, LDL 121, A1c 5.8.    Expected Outcome Goals in progress. Robert [name] continues to attend the [facility] education and nutrition series regularly. He has started making many dietary changes including increased dietary fiber, decreased saturated fat intake, and decreased sodium. He is down 12.1# since starting with our program. He continues follow-up with anti-coagulation clinic regularly. 8/26 Cardiology note, "Per review of Dr. Di Kindle last note, he cannot tolerate diltiazem or beta blockers and has declined cardioversion in the past". Patient will benefit from participation in intensive cardiac rehab for nutrition, exercise, and lifestyle modification.             Psychosocial: Target Goals: Acknowledge presence or absence of significant depression and/or stress, maximize coping skills, provide positive support system. Participant is able to verbalize types and ability to use techniques and skills needed for reducing stress and depression.  Initial Review & Psychosocial Screening:  Initial Psych Review & Screening - 01/22/23 1205       Initial Review   Current issues with None Identified      Family Dynamics   Good Support System? Yes   Has spouse and sons, family for support     Barriers   Psychosocial barriers to participate in program The patient should benefit from training in stress management and relaxation.      Screening Interventions   Interventions Encouraged to exercise             Quality of Life  Scores:  Quality of Life - 01/22/23 1237       Quality of Life   Select Quality of Life      Quality of Life Scores   Health/Function Pre 11.17 %    Socioeconomic Pre 23 %    Psych/Spiritual Pre 21.57 %    Family Pre 21.6 %    GLOBAL Pre 17.11 %            Scores of 19 and below usually indicate a poorer quality of life in these areas.  A difference of  2-3 points is a clinically meaningful difference.  A difference of 2-3 points in the  total score of the Quality of Life Index has been associated with significant improvement in overall quality of life, self-image, physical symptoms, and general health in studies assessing change in quality of life.  PHQ-9: Review Flowsheet  More data exists      01/27/2023 01/22/2023 10/16/2022 01/20/2022 08/08/2021  Depression screen PHQ 2/9  Decreased Interest 0 0 0 0 0  Down, Depressed, Hopeless 0 0 0 0 0  PHQ - 2 Score 0 0 0 0 0  Altered sleeping 0 0 - - -  Tired, decreased energy 3 3 - - -  Change in appetite 0 0 - - -  Feeling bad or failure about yourself  1 1 - - -  Trouble concentrating 1 1 - - -  Moving slowly or fidgety/restless 0 0 - - -  Suicidal thoughts 0 0 - - -  PHQ-9 Score 5 5 - - -  Difficult doing work/chores Extremely dIfficult Extremely dIfficult - - -    Details           Interpretation of Total Score  Total Score Depression Severity:  1-4 = Minimal depression, 5-9 = Mild depression, 10-14 = Moderate depression, 15-19 = Moderately severe depression, 20-27 = Severe depression   Psychosocial Evaluation and Intervention:   Psychosocial Re-Evaluation:  Psychosocial Re-Evaluation     Row Name 02/17/23 1538 03/17/23 1411           Psychosocial Re-Evaluation   Current issues with None Identified None Identified      Interventions Encouraged to attend Cardiac Rehabilitation for the exercise Encouraged to attend Cardiac Rehabilitation for the exercise      Continue Psychosocial Services  No Follow up required No Follow up required               Psychosocial Discharge (Final Psychosocial Re-Evaluation):  Psychosocial Re-Evaluation - 03/17/23 1411       Psychosocial Re-Evaluation   Current issues with None Identified    Interventions Encouraged to attend Cardiac Rehabilitation for the exercise    Continue Psychosocial Services  No Follow up required             Vocational Rehabilitation: Provide vocational rehab assistance to qualifying  candidates.   Vocational Rehab Evaluation & Intervention:  Vocational Rehab - 01/22/23 1209       Initial Vocational Rehab Evaluation & Intervention   Assessment shows need for Vocational Rehabilitation No   Pt is retired            Education: Education Goals: Education classes will be provided on a weekly basis, covering required topics. Participant will state understanding/return demonstration of topics presented.    Education     Row Name 01/26/23 1300     Education   Cardiac Education Topics Pritikin   [employer]  Educator Dietitian   Select Nutrition   Nutrition Workshop Fueling a Forensic psychologist   Instruction Review Code 1- Verbalizes Understanding   Class Start Time 1145   Class Stop Time 1234   Class Time Calculation (min) 49 min    Row Name 01/28/23 1400     Education   Cardiac Education Topics Pritikin   Customer service manager   Weekly Topic Simple Sides and Sauces   Instruction Review Code 1- Verbalizes Understanding   Class Start Time 1145   Class Stop Time 1226   Class Time Calculation (min) 41 min    Row Name 01/30/23 1200     Education   Cardiac Education Topics Pritikin   Nurse, children's Exercise Physiologist   Select Psychosocial   Psychosocial How Our Thoughts Can Heal Our Hearts   Instruction Review Code 1- Verbalizes Understanding   Class Start Time 1150   Class Stop Time 1230   Class Time Calculation (min) 40 min    Row Name 02/02/23 1300     Education   Cardiac Education Topics Pritikin   Select Workshops     Workshops   Educator Exercise Physiologist   Select Exercise   Exercise Workshop Managing Heart Disease: Your Path to a Healthier Heart   Instruction Review Code 1- Verbalizes Understanding   Class Start Time 1147   Class Stop Time 1256   Class Time Calculation (min) 69 min    Row Name 02/04/23 1500     Education   Cardiac  Education Topics Pritikin   Orthoptist   Educator Dietitian   Weekly Topic Powerhouse Plant-Based Proteins   Instruction Review Code 1- Verbalizes Understanding   Class Start Time 1145   Class Stop Time 1228   Class Time Calculation (min) 43 min    Row Name 02/06/23 1200     Education   Cardiac Education Topics Pritikin   Hospital doctor Education   General Education Hypertension and Heart Disease   Instruction Review Code 1- Verbalizes Understanding   Class Start Time 1200   Class Stop Time 1239   Class Time Calculation (min) 39 min    Row Name 02/09/23 1200     Education   Cardiac Education Topics Pritikin   Geographical information systems officer Psychosocial   Psychosocial Workshop From Head to Heart: The Power of a Healthy Outlook   Instruction Review Code 1- Verbalizes Understanding   Class Start Time 1155   Class Stop Time 1245   Class Time Calculation (min) 50 min    Row Name 02/11/23 1300     Education   Cardiac Education Topics Pritikin   Customer service manager   Weekly Topic Adding Flavor - Sodium-Free   Instruction Review Code 1- Verbalizes Understanding   Class Start Time 1140   Class Stop Time 1215   Class Time Calculation (min) 35 min    Row Name 02/18/23 1400     Education   Cardiac Education Topics Pritikin   Customer service manager   Weekly Topic Fast and Healthy Breakfasts   Instruction Review  Code 1- Verbalizes Understanding   Class Start Time 1140   Class Stop Time 1225   Class Time Calculation (min) 45 min    Row Name 02/18/23 1500     Education   Cardiac Education Topics Pritikin   Customer service manager   Weekly Topic Fast and Healthy Breakfasts   Instruction Review Code  1- Verbalizes Understanding   Class Start Time 1400   Class Stop Time 1445   Class Time Calculation (min) 45 min    Row Name 02/20/23 1300     Education   Cardiac Education Topics Pritikin   Writer Psychosocial   Psychosocial Healthy Minds, Bodies, Hearts   Instruction Review Code 1- Verbalizes Understanding   Class Start Time 1145   Class Stop Time 1222   Class Time Calculation (min) 37 min    Row Name 02/25/23 1400     Education   Cardiac Education Topics Pritikin   Customer service manager   Weekly Topic Personalizing Your Pritikin Plate   Instruction Review Code 1- Verbalizes Understanding   Class Start Time 1150   Class Stop Time 1225   Class Time Calculation (min) 35 min    Row Name 02/27/23 1300     Education   Cardiac Education Topics Pritikin   Select Workshops     Workshops   Educator Exercise Physiologist   Select Exercise   Exercise Workshop Location manager and Fall Prevention   Instruction Review Code 1- Verbalizes Understanding   Class Start Time 1151   Class Stop Time 1238   Class Time Calculation (min) 47 min    Row Name 03/02/23 1300     Education   Cardiac Education Topics Pritikin   Glass blower/designer Nutrition   Nutrition Workshop Label Reading   Instruction Review Code 1- Tax inspector   Class Start Time 1145   Class Stop Time 1234   Class Time Calculation (min) 49 min    Row Name 03/04/23 1400     Education   Cardiac Education Topics Pritikin   Orthoptist   Educator Dietitian   Weekly Topic [employer] Desserts   Instruction Review Code 1- Verbalizes Understanding   Class Start Time 1145   Class Stop Time 1230   Class Time Calculation (min) 45 min    Row Name 03/06/23 1300     Education   Cardiac Education Topics Pritikin   Architectural technologist Nutrition   Nutrition Other  Label Reading   Instruction Review Code 1- Verbalizes Understanding   Class Start Time 1150   Class Stop Time 1235   Class Time Calculation (min) 45 min    Row Name 03/09/23 1500     Education   Cardiac Education Topics Pritikin   Select Workshops     Workshops   Educator Exercise Physiologist   Select Psychosocial   Psychosocial Workshop Recognizing and Reducing Stress   Instruction Review Code 1- Verbalizes Understanding   Class Start Time 1148   Class Stop Time 1237   Class Time Calculation (min) 49 min    Row Name 03/11/23 1400  Education   Cardiac Education Topics Pritikin   Customer service manager   Weekly Topic Tasty Appetizers and Snacks   Instruction Review Code 1- Verbalizes Understanding   Class Start Time 1145   Class Stop Time 1225   Class Time Calculation (min) 40 min    Row Name 03/13/23 1200     Education   Cardiac Education Topics Pritikin   Select Core Videos     Core Videos   Educator Exercise Physiologist   Select Nutrition   Nutrition Calorie Density   Instruction Review Code 1- Verbalizes Understanding   Class Start Time 1155   Class Stop Time 1235   Class Time Calculation (min) 40 min    Row Name 03/16/23 1300     Education   Cardiac Education Topics Pritikin   Select Workshops     Workshops   Educator Exercise Physiologist   Select Exercise   Exercise Workshop Exercise Basics: Building Your Action Plan   Instruction Review Code 1- Verbalizes Understanding   Class Start Time 1155   Class Stop Time 1237   Class Time Calculation (min) 42 min            Core Videos: Exercise    Move It!  Clinical staff conducted group or individual video education with verbal and written material and guidebook.  Patient learns the recommended Pritikin exercise program. Exercise with the goal of living a long, healthy  life. Some of the health benefits of exercise include controlled diabetes, healthier blood pressure levels, improved cholesterol levels, improved heart and lung capacity, improved sleep, and better body composition. Everyone should speak with their doctor before starting or changing an exercise routine.  Biomechanical Limitations Clinical staff conducted group or individual video education with verbal and written material and guidebook.  Patient learns how biomechanical limitations can impact exercise and how we can mitigate and possibly overcome limitations to have an impactful and balanced exercise routine.  Body Composition Clinical staff conducted group or individual video education with verbal and written material and guidebook.  Patient learns that body composition (ratio of muscle mass to fat mass) is a key component to assessing overall fitness, rather than body weight alone. Increased fat mass, especially visceral belly fat, can put [place] at increased risk for metabolic syndrome, type 2 diabetes, heart disease, and even death. It is recommended to combine diet and exercise (cardiovascular and resistance training) to improve your body composition. Seek guidance from your physician and exercise physiologist before implementing an exercise routine.  Exercise Action Plan Clinical staff conducted group or individual video education with verbal and written material and guidebook.  Patient learns the recommended strategies to achieve and enjoy long-term exercise adherence, including variety, self-motivation, self-efficacy, and positive decision making. Benefits of exercise include fitness, good health, weight management, more energy, better sleep, less stress, and overall well-being.  Medical   Heart Disease Risk Reduction Clinical staff conducted group or individual video education with verbal and written material and guidebook.  Patient learns our heart is our most vital organ as it circulates  oxygen, nutrients, white blood cells, and hormones throughout the entire body, and carries waste away. Data supports a plant-based eating plan like the Pritikin Program for its effectiveness in slowing progression of and reversing heart disease. The video provides a number of recommendations to address heart disease.   Metabolic Syndrome and Belly Fat  Clinical staff conducted group or individual video education with verbal  and written material and guidebook.  Patient learns what metabolic syndrome is, how it leads to heart disease, and how one can reverse it and keep it from coming back. You have metabolic syndrome if you have 3 of the following 5 criteria: abdominal obesity, high blood pressure, high triglycerides, low HDL cholesterol, and high blood sugar.  Hypertension and Heart Disease Clinical staff conducted group or individual video education with verbal and written material and guidebook.  Patient learns that high blood pressure, or hypertension, is very common in the [place]. Hypertension is largely due to excessive salt intake, but other important risk factors include being overweight, physical inactivity, drinking too much alcohol, smoking, and not eating enough potassium from fruits and vegetables. High blood pressure is a leading risk factor for heart attack, stroke, congestive heart failure, dementia, kidney failure, and premature death. Long-term effects of excessive salt intake include stiffening of the arteries and thickening of heart muscle and organ damage. Recommendations include ways to reduce hypertension and the risk of heart disease.  Diseases of Our Time - Focusing on Diabetes Clinical staff conducted group or individual video education with verbal and written material and guidebook.  Patient learns why the best way to stop diseases of our time is prevention, through food and other lifestyle changes. Medicine (such as prescription pills and surgeries) is often only a  Band-Aid on the problem, not a long-term solution. Most common diseases of our time include obesity, type 2 diabetes, hypertension, heart disease, and cancer. The Pritikin Program is recommended and has been proven to help reduce, reverse, and/or prevent the damaging effects of metabolic syndrome.  Nutrition   Overview of the Pritikin Eating Plan  Clinical staff conducted group or individual video education with verbal and written material and guidebook.  Patient learns about the Pritikin Eating Plan for disease risk reduction. The Pritikin Eating Plan emphasizes a wide variety of unrefined, minimally-processed carbohydrates, like fruits, vegetables, whole grains, and legumes. Go, Caution, and Stop food choices are explained. Plant-based and lean animal proteins are emphasized. Rationale provided for low sodium intake for blood pressure control, low added sugars for blood sugar stabilization, and low added fats and oils for coronary artery disease risk reduction and weight management.  Calorie Density  Clinical staff conducted group or individual video education with verbal and written material and guidebook.  Patient learns about calorie density and how it impacts the Pritikin Eating Plan. Knowing the characteristics of the food you choose will help you decide whether those foods will lead to weight gain or weight loss, and whether you want to consume more or less of them. Weight loss is usually a side effect of the Pritikin Eating Plan because of its focus on low calorie-dense foods.  Label Reading  Clinical staff conducted group or individual video education with verbal and written material and guidebook.  Patient learns about the Pritikin recommended label reading guidelines and corresponding recommendations regarding calorie density, added sugars, sodium content, and whole grains.  Dining Out - Part 1  Clinical staff conducted group or individual video education with verbal and written material  and guidebook.  Patient learns that restaurant meals can be sabotaging because they can be so high in calories, fat, sodium, and/or sugar. Patient learns recommended strategies on how to positively address this and avoid unhealthy pitfalls.  Facts on Fats  Clinical staff conducted group or individual video education with verbal and written material and guidebook.  Patient learns that lifestyle modifications can be just as  effective, if not more so, as many medications for lowering your risk of heart disease. A Pritikin lifestyle can help to reduce your risk of inflammation and atherosclerosis (cholesterol build-up, or plaque, in the artery walls). Lifestyle interventions such as dietary choices and physical activity address the cause of atherosclerosis. A review of the types of fats and their impact on blood cholesterol levels, along with dietary recommendations to reduce fat intake is also included.  Nutrition Action Plan  Clinical staff conducted group or individual video education with verbal and written material and guidebook.  Patient learns how to incorporate Pritikin recommendations into their lifestyle. Recommendations include planning and keeping personal health goals in mind as an important part of their success.  Healthy Mind-Set    Healthy Minds, Bodies, Hearts  Clinical staff conducted group or individual video education with verbal and written material and guidebook.  Patient learns how to identify when they are stressed. Video will discuss the impact of that stress, as well as the many benefits of stress management. Patient will also be introduced to stress management techniques. The way we think, act, and feel has an impact on our hearts.  How Our Thoughts Can Heal Our Hearts  Clinical staff conducted group or individual video education with verbal and written material and guidebook.  Patient learns that negative thoughts can cause depression and anxiety. This can result in negative  lifestyle behavior and serious health problems. Cognitive behavioral therapy is an effective method to help control our thoughts in order to change and improve our emotional outlook.  Additional Videos:  Exercise    Improving Performance  Clinical staff conducted group or individual video education with verbal and written material and guidebook.  Patient learns to use a non-linear approach by alternating intensity levels and lengths of time spent exercising to help burn more calories and lose more body fat. Cardiovascular exercise helps improve heart health, metabolism, hormonal balance, blood sugar control, and recovery from fatigue. Resistance training improves strength, endurance, balance, coordination, reaction time, metabolism, and muscle mass. Flexibility exercise improves circulation, posture, and balance. Seek guidance from your physician and exercise physiologist before implementing an exercise routine and learn your capabilities and proper form for all exercise.  Introduction to Yoga  Clinical staff conducted group or individual video education with verbal and written material and guidebook.  Patient learns about yoga, a discipline of the coming together of mind, breath, and body. The benefits of yoga include improved flexibility, improved range of motion, better posture and core strength, increased lung function, weight loss, and positive self-image. Yoga's heart health benefits include lowered blood pressure, healthier heart rate, decreased cholesterol and triglyceride levels, improved immune function, and reduced stress. Seek guidance from your physician and exercise physiologist before implementing an exercise routine and learn your capabilities and proper form for all exercise.  Medical   Aging: Enhancing Your Quality of Life  Clinical staff conducted group or individual video education with verbal and written material and guidebook.  Patient learns key strategies and recommendations  to stay in good physical health and enhance quality of life, such as prevention strategies, having an advocate, securing a Health Care Proxy and Power of Attorney, and keeping a list of medications and system for tracking them. It also discusses how to avoid risk for bone loss.  Biology of Weight Control  Clinical staff conducted group or individual video education with verbal and written material and guidebook.  Patient learns that weight gain occurs because we consume more calories than  we burn (eating more, moving less). Even if your body weight is normal, you may have higher ratios of fat compared to muscle mass. Too much body fat puts you at increased risk for cardiovascular disease, heart attack, stroke, type 2 diabetes, and obesity-related cancers. In addition to exercise, following the Pritikin Eating Plan can help reduce your risk.  Decoding Lab Results  Clinical staff conducted group or individual video education with verbal and written material and guidebook.  Patient learns that lab test reflects one measurement whose values change over time and are influenced by many factors, including medication, stress, sleep, exercise, food, hydration, pre-existing medical conditions, and more. It is recommended to use the knowledge from this video to become more involved with your lab results and evaluate your numbers to speak with your doctor.   Diseases of Our Time - Overview  Clinical staff conducted group or individual video education with verbal and written material and guidebook.  Patient learns that according to the CDC, 50% to 70% of chronic diseases (such as obesity, type 2 diabetes, elevated lipids, hypertension, and heart disease) are avoidable through lifestyle improvements including healthier food choices, listening to satiety cues, and increased physical activity.  Sleep Disorders Clinical staff conducted group or individual video education with verbal and written material and  guidebook.  Patient learns how good quality and duration of sleep are important to overall health and well-being. Patient also learns about sleep disorders and how they impact health along with recommendations to address them, including discussing with a physician.  Nutrition  Dining Out - Part 2 Clinical staff conducted group or individual video education with verbal and written material and guidebook.  Patient learns how to plan ahead and communicate in order to maximize their dining experience in a healthy and nutritious manner. Included are recommended food choices based on the type of restaurant the patient is visiting.   Fueling a Banker conducted group or individual video education with verbal and written material and guidebook.  There is a strong connection between our food choices and our health. Diseases like obesity and type 2 diabetes are very prevalent and are in large-part due to lifestyle choices. The Pritikin Eating Plan provides plenty of food and hunger-curbing satisfaction. It is easy to follow, affordable, and helps reduce health risks.  Menu Workshop  Clinical staff conducted group or individual video education with verbal and written material and guidebook.  Patient learns that restaurant meals can sabotage health goals because they are often packed with calories, fat, sodium, and sugar. Recommendations include strategies to plan ahead and to communicate with the manager, chef, or server to help order a healthier meal.  Planning Your Eating Strategy  Clinical staff conducted group or individual video education with verbal and written material and guidebook.  Patient learns about the Pritikin Eating Plan and its benefit of reducing the risk of disease. The Pritikin Eating Plan does not focus on calories. Instead, it emphasizes high-quality, nutrient-rich foods. By knowing the characteristics of the foods, we choose, we can determine their calorie density  and make informed decisions.  Targeting Your Nutrition Priorities  Clinical staff conducted group or individual video education with verbal and written material and guidebook.  Patient learns that lifestyle habits have a tremendous impact on disease risk and progression. This video provides eating and physical activity recommendations based on your personal health goals, such as reducing LDL cholesterol, losing weight, preventing or controlling type 2 diabetes, and reducing high blood pressure.  Vitamins and Minerals  Clinical staff conducted group or individual video education with verbal and written material and guidebook.  Patient learns different ways to obtain key vitamins and minerals, including through a recommended healthy diet. It is important to discuss all supplements you take with your doctor.   Healthy Mind-Set    Smoking Cessation  Clinical staff conducted group or individual video education with verbal and written material and guidebook.  Patient learns that cigarette smoking and tobacco addiction pose a serious health risk which affects millions of people. Stopping smoking will significantly reduce the risk of heart disease, lung disease, and many forms of cancer. Recommended strategies for quitting are covered, including working with your doctor to develop a successful plan.  Culinary   Becoming a Set designer conducted group or individual video education with verbal and written material and guidebook.  Patient learns that cooking at home can be healthy, cost-effective, quick, and puts them in control. Keys to cooking healthy recipes will include looking at your recipe, assessing your equipment needs, planning ahead, making it simple, choosing cost-effective seasonal ingredients, and limiting the use of added fats, salts, and sugars.  Cooking - Breakfast and Snacks  Clinical staff conducted group or individual video education with verbal and written material  and guidebook.  Patient learns how important breakfast is to satiety and nutrition through the entire day. Recommendations include key foods to eat during breakfast to help stabilize blood sugar levels and to prevent overeating at meals later in the day. Planning ahead is also a key component.  Cooking - Educational psychologist conducted group or individual video education with verbal and written material and guidebook.  Patient learns eating strategies to improve overall health, including an approach to cook more at home. Recommendations include thinking of animal protein as a side on your plate rather than center stage and focusing instead on lower calorie dense options like vegetables, fruits, whole grains, and plant-based proteins, such as beans. Making sauces in large quantities to freeze for later and leaving the skin on your vegetables are also recommended to maximize your experience.  Cooking - Healthy Salads and Dressing Clinical staff conducted group or individual video education with verbal and written material and guidebook.  Patient learns that vegetables, fruits, whole grains, and legumes are the foundations of the Pritikin Eating Plan. Recommendations include how to incorporate each of these in flavorful and healthy salads, and how to create homemade salad dressings. Proper handling of ingredients is also covered. Cooking - Soups and [employer] - Soups and Desserts Clinical staff conducted group or individual video education with verbal and written material and guidebook.  Patient learns that Pritikin soups and desserts make for easy, nutritious, and delicious snacks and meal components that are low in sodium, fat, sugar, and calorie density, while high in vitamins, minerals, and filling fiber. Recommendations include simple and healthy ideas for soups and desserts.   Overview     The Pritikin Solution Program Overview Clinical staff conducted group or individual video  education with verbal and written material and guidebook.  Patient learns that the results of the Pritikin Program have been documented in more than 100 articles published in peer-reviewed journals, and the benefits include reducing risk factors for (and, in some cases, even reversing) high cholesterol, high blood pressure, type 2 diabetes, obesity, and more! An overview of the three key pillars of the Pritikin Program will be covered: eating well, doing regular exercise, and  having a healthy mind-set.  WORKSHOPS  Exercise: Exercise Basics: Building Your Action Plan Clinical staff led group instruction and group discussion with PowerPoint presentation and patient guidebook. To enhance the learning environment the use of posters, models and videos may be added. At the conclusion of this workshop, patients will comprehend the difference between physical activity and exercise, as well as the benefits of incorporating both, into their routine. Patients will understand the FITT (Frequency, Intensity, Time, and Type) principle and how to use it to build an exercise action plan. In addition, safety concerns and other considerations for exercise and cardiac rehab will be addressed by the presenter. The purpose of this lesson is to promote a comprehensive and effective weekly exercise routine in order to improve patients' overall level of fitness.   Managing Heart Disease: Your Path to a Healthier Heart Clinical staff led group instruction and group discussion with PowerPoint presentation and patient guidebook. To enhance the learning environment the use of posters, models and videos may be added.At the conclusion of this workshop, patients will understand the anatomy and physiology of the heart. Additionally, they will understand how Pritikin's three pillars impact the risk factors, the progression, and the management of heart disease.  The purpose of this lesson is to provide a high-level overview of the  heart, heart disease, and how the Pritikin lifestyle positively impacts risk factors.  Exercise Biomechanics Clinical staff led group instruction and group discussion with PowerPoint presentation and patient guidebook. To enhance the learning environment the use of posters, models and videos may be added. Patients will learn how the structural parts of their bodies function and how these functions impact their daily activities, movement, and exercise. Patients will learn how to promote a neutral spine, learn how to manage pain, and identify ways to improve their physical movement in order to promote healthy living. The purpose of this lesson is to expose patients to common physical limitations that impact physical activity. Participants will learn practical ways to adapt and manage aches and pains, and to minimize their effect on regular exercise. Patients will learn how to maintain good posture while sitting, walking, and lifting.  Balance Training and Fall Prevention  Clinical staff led group instruction and group discussion with PowerPoint presentation and patient guidebook. To enhance the learning environment the use of posters, models and videos may be added. At the conclusion of this workshop, patients will understand the importance of their sensorimotor skills (vision, proprioception, and the vestibular system) in maintaining their ability to balance as they age. Patients will apply a variety of balancing exercises that are appropriate for their current level of function. Patients will understand the common causes for poor balance, possible solutions to these problems, and ways to modify their physical environment in order to minimize their fall risk. The purpose of this lesson is to teach patients about the importance of maintaining balance as they age and ways to minimize their risk of falling.  WORKSHOPS   Nutrition:  Fueling a Ship broker led group instruction and  group discussion with PowerPoint presentation and patient guidebook. To enhance the learning environment the use of posters, models and videos may be added. Patients will review the foundational principles of the Pritikin Eating Plan and understand what constitutes a serving size in each of the food groups. Patients will also learn Pritikin-friendly foods that are better choices when away from home and review make-ahead meal and snack options. Calorie density will be reviewed and applied to three nutrition  priorities: weight maintenance, weight loss, and weight gain. The purpose of this lesson is to reinforce (in a group setting) the key concepts around what patients are recommended to eat and how to apply these guidelines when away from home by planning and selecting Pritikin-friendly options. Patients will understand how calorie density may be adjusted for different weight management goals.  Mindful Eating  Clinical staff led group instruction and group discussion with PowerPoint presentation and patient guidebook. To enhance the learning environment the use of posters, models and videos may be added. Patients will briefly review the concepts of the Pritikin Eating Plan and the importance of low-calorie dense foods. The concept of mindful eating will be introduced as well as the importance of paying attention to internal hunger signals. Triggers for non-hunger eating and techniques for dealing with triggers will be explored. The purpose of this lesson is to provide patients with the opportunity to review the basic principles of the Pritikin Eating Plan, discuss the value of eating mindfully and how to measure internal cues of hunger and fullness using the Hunger Scale. Patients will also discuss reasons for non-hunger eating and learn strategies to use for controlling emotional eating.  Targeting Your Nutrition Priorities Clinical staff led group instruction and group discussion with PowerPoint presentation  and patient guidebook. To enhance the learning environment the use of posters, models and videos may be added. Patients will learn how to determine their genetic susceptibility to disease by reviewing their family history. Patients will gain insight into the importance of diet as part of an overall healthy lifestyle in mitigating the impact of genetics and other environmental insults. The purpose of this lesson is to provide patients with the opportunity to assess their personal nutrition priorities by looking at their family history, their own health history and current risk factors. Patients will also be able to discuss ways of prioritizing and modifying the Pritikin Eating Plan for their highest risk areas  Menu  Clinical staff led group instruction and group discussion with PowerPoint presentation and patient guidebook. To enhance the learning environment the use of posters, models and videos may be added. Using menus brought in from [employer], or printed from [employer], patients will apply the Pritikin dining out guidelines that were presented in the [employer] video. Patients will also be able to practice these guidelines in a variety of provided scenarios. The purpose of this lesson is to provide patients with the opportunity to practice hands-on learning of the Pritikin Dining Out guidelines with actual menus and practice scenarios.  Label Reading Clinical staff led group instruction and group discussion with PowerPoint presentation and patient guidebook. To enhance the learning environment the use of posters, models and videos may be added. Patients will review and discuss the Pritikin label reading guidelines presented in Pritikin's Label Reading Educational series video. Using fool labels brought in from local grocery stores and markets, patients will apply the label reading guidelines and determine if the packaged food meet the Pritikin guidelines. The purpose of  this lesson is to provide patients with the opportunity to review, discuss, and practice hands-on learning of the Pritikin Label Reading guidelines with actual packaged food labels. Cooking School  Pritikin's [employer] are designed to teach patients ways to prepare quick, simple, and affordable recipes at home. The importance of nutrition's role in chronic disease risk reduction is reflected in its emphasis in the overall Pritikin program. By learning how to prepare essential core Pritikin Eating Plan recipes,  patients will increase control over what they eat; be able to customize the flavor of foods without the use of added salt, sugar, or fat; and improve the quality of the food they consume. By learning a set of core recipes which are easily assembled, quickly prepared, and affordable, patients are more likely to prepare more healthy foods at home. These workshops focus on convenient breakfasts, simple entres, side dishes, and desserts which can be prepared with minimal effort and are consistent with nutrition recommendations for cardiovascular risk reduction. Cooking [employer] are taught by a Armed forces logistics/support/administrative officer (RD) who has been trained by the [employer]. The chef or RD has a clear understanding of the importance of minimizing - if not completely eliminating - added fat, sugar, and sodium in recipes. Throughout the series of Cooking School Workshop sessions, patients will learn about healthy ingredients and efficient methods of cooking to build confidence in their capability to prepare    Cooking School weekly topics:  Adding Flavor- Sodium-Free  Fast and Healthy Breakfasts  Powerhouse Plant-Based Proteins  Satisfying Salads and Dressings  Simple Sides and Sauces  International Cuisine-Spotlight on the [employer] Zones  Delicious Desserts  Savory Soups  [employer] - Meals in a Astronomer Appetizers and Snacks  Comforting Weekend  Breakfasts  One-Pot Wonders   Fast Evening Meals  Landscape architect Your Pritikin Plate  WORKSHOPS   Healthy Mindset (Psychosocial):  Focused Goals, Sustainable Changes Clinical staff led group instruction and group discussion with PowerPoint presentation and patient guidebook. To enhance the learning environment the use of posters, models and videos may be added. Patients will be able to apply effective goal setting strategies to establish at least one personal goal, and then take consistent, meaningful action toward that goal. They will learn to identify common barriers to achieving personal goals and develop strategies to overcome them. Patients will also gain an understanding of how our mind-set can impact our ability to achieve goals and the importance of cultivating a positive and growth-oriented mind-set. The purpose of this lesson is to provide patients with a deeper understanding of how to set and achieve personal goals, as well as the tools and strategies needed to overcome common obstacles which may arise along the way.  From Head to Heart: The Power of a Healthy Outlook  Clinical staff led group instruction and group discussion with PowerPoint presentation and patient guidebook. To enhance the learning environment the use of posters, models and videos may be added. Patients will be able to recognize and describe the impact of emotions and mood on physical health. They will discover the importance of self-care and explore self-care practices which may work for them. Patients will also learn how to utilize the 4 C's to cultivate a healthier outlook and better manage stress and challenges. The purpose of this lesson is to demonstrate to patients how a healthy outlook is an essential part of maintaining good health, especially as they continue their cardiac rehab journey.  Healthy Sleep for a Healthy Heart Clinical staff led group instruction and group discussion with  PowerPoint presentation and patient guidebook. To enhance the learning environment the use of posters, models and videos may be added. At the conclusion of this workshop, patients will be able to demonstrate knowledge of the importance of sleep to overall health, well-being, and quality of life. They will understand the symptoms of, and treatments for, common sleep disorders. Patients will also be able to identify daytime and  nighttime behaviors which impact sleep, and they will be able to apply these tools to help manage sleep-related challenges. The purpose of this lesson is to provide patients with a general overview of sleep and outline the importance of quality sleep. Patients will learn about a few of the most common sleep disorders. Patients will also be introduced to the concept of "sleep hygiene," and discover ways to self-manage certain sleeping problems through simple daily behavior changes. Finally, the workshop will motivate patients by clarifying the links between quality sleep and their goals of heart-healthy living.   Recognizing and Reducing Stress Clinical staff led group instruction and group discussion with PowerPoint presentation and patient guidebook. To enhance the learning environment the use of posters, models and videos may be added. At the conclusion of this workshop, patients will be able to understand the types of stress reactions, differentiate between acute and chronic stress, and recognize the impact that chronic stress has on their health. They will also be able to apply different coping mechanisms, such as reframing negative self-talk. Patients will have the opportunity to practice a variety of stress management techniques, such as deep abdominal breathing, progressive muscle relaxation, and/or guided imagery.  The purpose of this lesson is to educate patients on the role of stress in their lives and to provide healthy techniques for coping with it.  Learning  Barriers/Preferences:  Learning Barriers/Preferences - 01/22/23 1238       Learning Barriers/Preferences   Learning Barriers Sight    Learning Preferences Audio;Verbal Instruction;Video;Computer/Internet;Written Material;Group Instruction;Individual Instruction;Pictoral;Skilled Demonstration             Education Topics:  Knowledge Questionnaire Score:  Knowledge Questionnaire Score - 01/22/23 1238       Knowledge Questionnaire Score   Pre Score 21/24             Core Components/Risk Factors/Patient Goals at Admission:  Personal Goals and Risk Factors at Admission - 01/22/23 1210       Core Components/Risk Factors/Patient Goals on Admission    Weight Management Yes;Obesity;Weight Loss    Intervention Weight Management: Develop a combined nutrition and exercise program designed to reach desired caloric intake, while maintaining appropriate intake of nutrient and fiber, sodium and fats, and appropriate energy expenditure required for the weight goal.;Weight Management: Provide education and appropriate resources to help participant work on and attain dietary goals.;Weight Management/Obesity: Establish reasonable short term and long term weight goals.;Obesity: Provide education and appropriate resources to help participant work on and attain dietary goals.    Admit Weight 310 lb 13.6 oz (141 kg)    Expected Outcomes Short Term: Continue to assess and modify interventions until short term weight is achieved;Long Term: Adherence to nutrition and physical activity/exercise program aimed toward attainment of established weight goal;Weight Loss: Understanding of general recommendations for a balanced deficit meal plan, which promotes 1-2 lb weight loss per week and includes a negative energy balance of [phone] kcal/d;Understanding recommendations for meals to include 15-35% energy as protein, 25-35% energy from fat, 35-60% energy from carbohydrates, less than 200mg  of dietary  cholesterol, 20-35 gm of total fiber daily;Understanding of distribution of calorie intake throughout the day with the consumption of 4-5 meals/snacks    Hypertension Yes    Intervention Provide education on lifestyle modifcations including regular physical activity/exercise, weight management, moderate sodium restriction and increased consumption of fresh fruit, vegetables, and low fat dairy, alcohol moderation, and smoking cessation.;Monitor prescription use compliance.    Expected Outcomes Short Term: Continued assessment and intervention  until BP is < 140/10mm HG in hypertensive participants. < 130/46mm HG in hypertensive participants with diabetes, heart failure or chronic kidney disease.;Long Term: Maintenance of blood pressure at goal levels.    Stress Yes    Intervention Offer individual and/or small group education and counseling on adjustment to heart disease, stress management and health-related lifestyle change. Teach and support self-help strategies.;Refer participants experiencing significant psychosocial distress to appropriate mental health specialists for further evaluation and treatment. When possible, include family members and significant others in education/counseling sessions.    Expected Outcomes Short Term: Participant demonstrates changes in health-related behavior, relaxation and other stress management skills, ability to obtain effective social support, and compliance with psychotropic medications if prescribed.;Long Term: Emotional wellbeing is indicated by absence of clinically significant psychosocial distress or social isolation.             Core Components/Risk Factors/Patient Goals Review:   Goals and Risk Factor Review     Row Name 02/17/23 1538 03/17/23 1413           Core Components/Risk Factors/Patient Goals Review   Personal Goals Review Weight Management/Obesity;Hypertension;Lipids Weight Management/Obesity;Hypertension;Lipids      Review Robert [name] is doing  well with exercise at cardiac rehab. Heart rate variable due to AFib. Robert [name] has been increasing his worloads and has lost 4.4. kg since starting cardiac rehab Robert [name] is doing well with exercise at cardiac rehab. Resting heart rate has been in the 90's to low 100's.  Robert [name] has been increasing his workloads and has lost 8.0 kg since starting cardiac rehab. Robert [name] is doing well with exercise and weight loss.      Expected Outcomes Robert [name] will continue to participate in cardiac rehab for exercise, nutrition , lifestyle modifications. Robert [name] will continue to participate in cardiac rehab for exercise, nutrition , lifestyle modifications.               Core Components/Risk Factors/Patient Goals at Discharge (Final Review):   Goals and Risk Factor Review - 03/17/23 1413       Core Components/Risk Factors/Patient Goals Review   Personal Goals Review Weight Management/Obesity;Hypertension;Lipids    Review Robert [name] is doing well with exercise at cardiac rehab. Resting heart rate has been in the 90's to low 100's.  Robert [name] has been increasing his workloads and has lost 8.0 kg since starting cardiac rehab. Robert [name] is doing well with exercise and weight loss.    Expected Outcomes Robert [name] will continue to participate in cardiac rehab for exercise, nutrition , lifestyle modifications.             ITP Comments:  ITP Comments     Row Name 01/22/23 0930 02/17/23 1537 03/17/23 1410       ITP Comments Armanda Magic, MD: Medical Director.  Patient introduced to the Pritikin Education Program/Intensive Cardiac Rehab.  Initial orientation packet reviewed with the patient today. Robert [name] has good attendance and participation in cardiac rehab/ 30 Day ITP review. Robert [name] has good attendance and participation in cardiac rehab              Comments: See ITP comments.Thayer Headings RN BSN

## 2023-03-18 ENCOUNTER — Encounter (HOSPITAL_COMMUNITY)
Admission: RE | Admit: 2023-03-18 | Discharge: 2023-03-18 | Disposition: A | Discharge status: Home / Self Care | Payer: MEDICARE | Source: Ambulatory Visit | Attending: Cardiology | Admitting: Cardiology

## 2023-03-18 DIAGNOSIS — Z48812 Encounter for surgical aftercare following surgery on the circulatory system: Secondary | ICD-10-CM | POA: Diagnosis not present

## 2023-03-18 DIAGNOSIS — Z952 Presence of prosthetic heart valve: Secondary | ICD-10-CM | POA: Diagnosis not present

## 2023-03-20 ENCOUNTER — Encounter (HOSPITAL_COMMUNITY)
Admission: RE | Admit: 2023-03-20 | Discharge: 2023-03-20 | Disposition: A | Discharge status: Home / Self Care | Payer: MEDICARE | Source: Ambulatory Visit | Attending: Cardiology

## 2023-03-20 DIAGNOSIS — Z952 Presence of prosthetic heart valve: Secondary | ICD-10-CM | POA: Diagnosis not present

## 2023-03-20 DIAGNOSIS — Z48812 Encounter for surgical aftercare following surgery on the circulatory system: Secondary | ICD-10-CM | POA: Diagnosis not present

## 2023-03-23 ENCOUNTER — Encounter (HOSPITAL_COMMUNITY)
Admission: RE | Admit: 2023-03-23 | Discharge: 2023-03-23 | Disposition: A | Discharge status: Home / Self Care | Payer: MEDICARE | Source: Ambulatory Visit | Attending: Cardiology | Admitting: Cardiology

## 2023-03-23 DIAGNOSIS — Z952 Presence of prosthetic heart valve: Secondary | ICD-10-CM

## 2023-03-23 DIAGNOSIS — Z48812 Encounter for surgical aftercare following surgery on the circulatory system: Secondary | ICD-10-CM | POA: Diagnosis not present

## 2023-03-25 ENCOUNTER — Encounter (HOSPITAL_COMMUNITY)
Admission: RE | Admit: 2023-03-25 | Discharge: 2023-03-25 | Disposition: A | Discharge status: Home / Self Care | Payer: MEDICARE | Source: Ambulatory Visit | Attending: Cardiology | Admitting: Cardiology

## 2023-03-25 DIAGNOSIS — Z952 Presence of prosthetic heart valve: Secondary | ICD-10-CM | POA: Insufficient documentation

## 2023-03-25 DIAGNOSIS — Z48812 Encounter for surgical aftercare following surgery on the circulatory system: Secondary | ICD-10-CM | POA: Insufficient documentation

## 2023-03-27 ENCOUNTER — Encounter (HOSPITAL_COMMUNITY)
Admission: RE | Admit: 2023-03-27 | Discharge: 2023-03-27 | Disposition: A | Discharge status: Home / Self Care | Payer: MEDICARE | Source: Ambulatory Visit | Attending: Cardiology | Admitting: Cardiology

## 2023-03-27 DIAGNOSIS — Z952 Presence of prosthetic heart valve: Secondary | ICD-10-CM | POA: Diagnosis not present

## 2023-03-27 DIAGNOSIS — Z48812 Encounter for surgical aftercare following surgery on the circulatory system: Secondary | ICD-10-CM | POA: Diagnosis not present

## 2023-03-30 ENCOUNTER — Encounter (HOSPITAL_COMMUNITY)
Admission: RE | Admit: 2023-03-30 | Discharge: 2023-03-30 | Disposition: A | Discharge status: Home / Self Care | Payer: MEDICARE | Source: Ambulatory Visit | Attending: Cardiology

## 2023-03-30 DIAGNOSIS — Z952 Presence of prosthetic heart valve: Secondary | ICD-10-CM | POA: Diagnosis not present

## 2023-03-30 DIAGNOSIS — Z48812 Encounter for surgical aftercare following surgery on the circulatory system: Secondary | ICD-10-CM | POA: Diagnosis not present

## 2023-04-01 ENCOUNTER — Encounter (HOSPITAL_COMMUNITY)
Admission: RE | Admit: 2023-04-01 | Discharge: 2023-04-01 | Disposition: A | Discharge status: Home / Self Care | Payer: MEDICARE | Source: Ambulatory Visit | Attending: Cardiology | Admitting: Cardiology

## 2023-04-01 DIAGNOSIS — Z952 Presence of prosthetic heart valve: Secondary | ICD-10-CM | POA: Diagnosis not present

## 2023-04-01 DIAGNOSIS — Z48812 Encounter for surgical aftercare following surgery on the circulatory system: Secondary | ICD-10-CM | POA: Diagnosis not present

## 2023-04-02 DIAGNOSIS — Z79899 Other long term (current) drug therapy: Secondary | ICD-10-CM | POA: Diagnosis not present

## 2023-04-02 NOTE — Progress Notes (Addendum)
Electrophysiology Office Note:   Date:  04/03/2023  ID:  Robert [name], DOB 1950-10-26, MRN [id number]  Primary Cardiologist: Jodelle Red, MD Electrophysiologist: Nobie Putnam, MD      History of Present Illness:   Robert [name] is a 72 y.o. male with h/o severe AS s/p TAVR 01/06/2023, severe Covid infection requiring prolonged hospitalization, atrial fibrillation, prior DVT/PE, dyslipidemia, obesity, paroxysmal SVT and HTN who is being seen today for Electrophysiology evaluation of atrial fibrillation at the request of Dr. Cristal Deer.   Patient reports first diagnosis of atrial fibrillation was in 2014; however, since his COVID infection in 2021 he thinks he has primarily been in atrial fibrillation. He thinks that on rare occasions he has been in normal sinus rhythm based on pulse readings from his BP machine. He had been on amiodarone previously but had side effects of hand swelling/weakness and thyroid abnormalities. He reports intolerance to diltiazem and beta blockers. He was recently started on digoxin. His main complaint is progressively worsening dyspnea on exertion, fatigue and lower extremity edema. He felt well immediately after TAVR, but over the past 2 months has declined.   Review of systems complete and found to be negative unless listed in HPI.   EP Information / Studies Reviewed:    EKG is not ordered today. EKG from 03/06/23 reviewed which showed atrial fibrillation and LBBB.  Echo 02/16/23:  Moderately decreased LV function, LVEF 30 to 35%.  Global hypokinesis. Mildly reduced RV size and systolic function.  Risk Assessment/Calculations:    CHA2DS2-VASc Score = 3   This indicates a 3.2% annual risk of stroke. The patient's score is based upon: CHF History: 1 HTN History: 1 Diabetes History: 0 Stroke History: 0 Vascular Disease History: 0 Age Score: 1 Gender Score: 0             Physical Exam:   VS:  BP 106/66   Pulse 83   Ht 5\' 8"  (1.727  m)   Wt 284 lb (128.8 kg)   SpO2 97%   BMI 43.18 kg/m    Wt Readings from Last 3 Encounters:  04/03/23 284 lb (128.8 kg)  03/06/23 294 lb 12.8 oz (133.7 kg)  02/16/23 (!) 300 lb 9.6 oz (136.4 kg)     GEN: Well nourished, well developed in no acute distress NECK: No JVD sitting upright; No carotid bruits CARDIAC: Irregularly irregular rate and rhythm, no murmurs, rubs, gallops RESPIRATORY:  Clear to auscultation without rales, wheezing or rhonchi  ABDOMEN: Soft, non-tender, non-distended EXTREMITIES:  1+ edema (R>L); No deformity   ASSESSMENT AND PLAN:   Robert [name] is a 72 y.o. male with h/o severe AS s/p TAVR 01/06/2023, severe Covid infection requiring prolonged hospitalization, atrial fibrillation, prior DVT/PE, dyslipidemia, obesity, paroxysmal SVT who is being seen today for Electrophysiology evaluation of atrial fibrillation at the request of Dr. Cristal Deer.   His LV ejection fraction pre-TAVR was reported as 50 to 55%.  Post TAVR he developed a new left bundle branch block and 1 month later his LVEF was reported as 30 to 35%.  He has clear dyssynchrony on his most recent echo which did not appear to be as pronounced prior to development of left bundle branch block.  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 His symptoms are consistent with heart failure and seem to correlate with the decline in his LVEF. He has been in atrial fibrillation for many years and does not appear to be overly symptomatic from  this, at least not knowingly.   #. LBBB #. Chronic systolic heart failure: He has a drop in his LVEF after developing a new left bundle branch block post TAVR.  He has dyssynchrony on his echocardiogram.  He has only recently been started on GDMT for his new heart failure diagnosis.Unfortunately, he has an intolerance to beta-blockers and his hyperkalemia limits use of MRAs.  -Repeat echocardiogram scheduled for 11/1 after being on  GDMT/Entresto. If LVEF does not recover then patient would be candidate for CRT-D implant. -Explained risks, benefits, and alternatives to CRT-D implantation, including but not limited to bleeding, infection, damage to heart or lungs, heart attack, stroke, or death.  Pt verbalized understanding and agrees to proceed if indicated.  -Communicated plan to Georgie Chard, NP and Dr. Cristal Deer who have been heavily involved in his care.   #. Longstanding persistent atrial fibrillation: Unclear how symptomatic he is. I feel his new cardiomyopathy and CHF are main drivers of his symptoms at this time. He thinks occasionally that he goes back into sinus rhythm based on readings from his BP machine.  -After implanting CRT-D, then we discussed pursuing a trial of Tikosyn loading vs AVN ablation if Tikosyn fails or AF interfering with BiV pacing. Do not want to pursue Tikosyn prior to CRT-D implant as this could commit him to uninterrupted anti-coagulation for 30 days if cardioversion is needed. If LVEF normalized on repeat echo, then we will not pursue CRT-D implant and will trial Tikosyn loading.   #. Secondary hypercoagulable state due to atrial fibrillation: - Continue Warfarin.   Follow up with Dr. Jimmey Ralph in 4 weeks to review results of echocardiogram and schedule CRT-D vs Tikoysn loading.   Signed, Nobie Putnam, MD

## 2023-04-03 ENCOUNTER — Encounter (HOSPITAL_COMMUNITY): Payer: MEDICARE

## 2023-04-03 ENCOUNTER — Ambulatory Visit: Payer: MEDICARE | Attending: Cardiology | Admitting: Cardiology

## 2023-04-03 ENCOUNTER — Telehealth (HOSPITAL_COMMUNITY): Payer: Self-pay

## 2023-04-03 ENCOUNTER — Encounter: Payer: Self-pay | Admitting: Cardiology

## 2023-04-03 VITALS — BP 106/66 | HR 83 | Ht 68.0 in | Wt 284.0 lb

## 2023-04-03 DIAGNOSIS — I5022 Chronic systolic (congestive) heart failure: Secondary | ICD-10-CM | POA: Diagnosis not present

## 2023-04-03 DIAGNOSIS — I447 Left bundle-branch block, unspecified: Secondary | ICD-10-CM | POA: Insufficient documentation

## 2023-04-03 DIAGNOSIS — D6869 Other thrombophilia: Secondary | ICD-10-CM | POA: Diagnosis present

## 2023-04-03 DIAGNOSIS — I4811 Longstanding persistent atrial fibrillation: Secondary | ICD-10-CM | POA: Diagnosis present

## 2023-04-03 LAB — DIGOXIN LEVEL: Digoxin, Serum: 0.5 ng/mL (ref 0.5–0.9)

## 2023-04-03 NOTE — Telephone Encounter (Signed)
Robert [name] called and canceled his appointment for today. He has an injury to his rib. He described it as the cartilage popping out. He stated " I heard it pop." He is going to follow up with cardiology.

## 2023-04-03 NOTE — Patient Instructions (Signed)
Medication Instructions:  Your physician recommends that you continue on your current medications as directed. Please refer to the Current Medication list given to you today.  *If you need a refill on your cardiac medications before your next appointment, please call your pharmacy*  Follow-Up: At [facility], you and your health needs are our priority.  As part of our continuing mission to provide you with exceptional heart care, we have created designated Provider Care Teams.  These Care Teams include your primary Cardiologist (physician) and Advanced Practice Providers (APPs -  Physician Assistants and Nurse Practitioners) who all work together to provide you with the care you need, when you need it.    Your next appointment:   4 weeks  Provider:   Nobie Putnam, MD

## 2023-04-06 ENCOUNTER — Encounter (HOSPITAL_COMMUNITY)
Admission: RE | Admit: 2023-04-06 | Discharge: 2023-04-06 | Disposition: A | Discharge status: Home / Self Care | Payer: MEDICARE | Source: Ambulatory Visit | Attending: Cardiology | Admitting: Cardiology

## 2023-04-06 DIAGNOSIS — Z952 Presence of prosthetic heart valve: Secondary | ICD-10-CM

## 2023-04-06 DIAGNOSIS — Z48812 Encounter for surgical aftercare following surgery on the circulatory system: Secondary | ICD-10-CM | POA: Diagnosis not present

## 2023-04-07 ENCOUNTER — Encounter (HOSPITAL_BASED_OUTPATIENT_CLINIC_OR_DEPARTMENT_OTHER): Payer: Self-pay | Admitting: Family

## 2023-04-07 ENCOUNTER — Ambulatory Visit (HOSPITAL_BASED_OUTPATIENT_CLINIC_OR_DEPARTMENT_OTHER): Payer: MEDICARE | Admitting: Family

## 2023-04-07 VITALS — BP 98/60 | HR 62 | Ht 68.0 in | Wt 284.7 lb

## 2023-04-07 DIAGNOSIS — I4821 Permanent atrial fibrillation: Secondary | ICD-10-CM | POA: Diagnosis not present

## 2023-04-07 DIAGNOSIS — I447 Left bundle-branch block, unspecified: Secondary | ICD-10-CM | POA: Diagnosis not present

## 2023-04-07 DIAGNOSIS — Z952 Presence of prosthetic heart valve: Secondary | ICD-10-CM

## 2023-04-07 DIAGNOSIS — I5022 Chronic systolic (congestive) heart failure: Secondary | ICD-10-CM | POA: Diagnosis not present

## 2023-04-07 MED ORDER — FUROSEMIDE 20 MG PO TABS
20.0000 mg | ORAL_TABLET | Freq: Every day | ORAL | 5 refills | Status: DC
Start: 2023-04-07 — End: 2023-07-03

## 2023-04-07 NOTE — Telephone Encounter (Signed)
Error in charting.

## 2023-04-07 NOTE — Progress Notes (Unsigned)
Cardiology Office Note:  .   Date:  04/09/2023  ID:  Robert [name], DOB 1950-12-31, MRN [id number] PCP: Tresa Garter, MD  [facility] Providers Cardiologist:  Jodelle Red, MD Electrophysiologist:  Nobie Putnam, MD    History of Present Illness: .   Robert [name] is a 72 y.o. male with hx of AS s/p TAVR 12/2022, atrial fibrillation (onset at time of COVID), prior DVT/PE, LBBB, nonobstructive CAD,  HLD, obesity, paroxysmal SVT.   Echo 11/2022 severe AS. LHC 12/16/22 mLAD 40% stenosis, 20% stenosis of ost-prox RCA. Underwent TAVR 01/06/23. Post op echo LVEF 50%, normally functioning TAVR. Coumadin was resumed.   Seen by TAVR team 02/16/23 with fatigue noted to be in atrial fibrillation. Echo LVEF 30-35%. [name] was started. At visit 03/06/23 with Dr. Cristal Deer, digoxin was initiated. He saw Dr. Jimmey Ralph of EP with echo scheduled in 1 month with plan to consider CRT-D vs Tikosyn. Noted prior intolerance to BB, Diltiazem. Previous intolerance to Amiodarone with hand swelling/thyroid abnormalities.   Presents today for follow up with his wife. Weight down 10 lbs from clinic visit 1 month ago. Notes blood pressure has been persistently hypotensive with readings 80s/40s associated with fatigue, near syncope. Taking Lasix 20mg  in the AM and then 2-3pm. Has been made dietary changes to follow more low sodium diet. Attributes prior potassium tablet to triggering gout. Notes his LE edema has been better.   ROS: Please see the history of present illness.    All other systems reviewed and are negative.   Studies Reviewed: .        Cardiac Studies & Procedures   CARDIAC CATHETERIZATION  CARDIAC CATHETERIZATION 12/16/2022  Narrative   Ost RCA to Prox RCA lesion is 20% stenosed.   Mid LAD lesion is 40% stenosed.  1.  Mild obstructive coronary artery disease. 2.  Fick cardiac output of 7.5 L/min and Fick cardiac index of 2.9 L/min/m with the following  hemodynamics: Right atrial pressure mean of 15 mmHg Right ventricular pressure 47/8 with end-diastolic pressure of 15 mmHg Wedge pressure mean 23 mmHg PA pressure 46/23 with a mean of 34 mmHg.  Recommendation: Continue evaluation for aortic valve intervention; increase Lasix to 20 mg twice daily and restart Coumadin tonight.  Findings Coronary Findings Diagnostic  Dominance: Right  Left Anterior Descending There is mild diffuse disease throughout the vessel. Mid LAD lesion is 40% stenosed.  Left Circumflex The vessel exhibits minimal luminal irregularities.  Right Coronary Artery The vessel exhibits minimal luminal irregularities. Ost RCA to Prox RCA lesion is 20% stenosed.  Intervention  No interventions have been documented.   STRESS TESTS  NM PET CT CARDIAC PERFUSION MULTI W/ABSOLUTE BLOODFLOW 11/26/2022  Narrative   LV perfusion is normal. There is no evidence of ischemia. There is no evidence of infarction.   Rest left ventricular function is abnormal. Rest global function is moderately reduced. There were no regional wall motion abnormalities. Rest EF: 40 %. Stress left ventricular function is abnormal. Stress global function is moderately reduced. There were no regional wall abnormalities. Stress EF: 33 %. End diastolic cavity size is mildly enlarged. End systolic cavity size is mildly enlarged.   Myocardial blood flow was computed to be 0.76ml/g/min at rest and 1.92ml/g/min at stress. Global myocardial blood flow reserve was 1.92 and was mildly abnormal.   Coronary calcium was present on the attenuation correction CT images. Severe coronary calcifications were present. Coronary calcifications were present in the left anterior descending artery and right  coronary artery distribution(s).   Findings are equivocal. The study is intermediate risk.   Despite normal perfusion study considered intermediate to high risk. Resting EF abnormal and decreases with stress. TID 1.3 with  abnormal MBF and severe calcium in LAD and RCA Baseline heart rhythm atrial fibrillation  CLINICAL DATA:  This over-read does not include interpretation of cardiac or coronary anatomy or pathology. The Cardiac PET CT interpretation by the cardiologist is attached.  COMPARISON:  None Available.  FINDINGS: Vascular: No acute abnormality.  Mediastinum/Nodes: Decreased AP diameter of the trachea and bilateral mainstem bronchi which may be seen with chronic tracheobronchomalacia. Lymph node posterior to the right superior pulmonary vein measures 1.5 cm, image 44/3  Lungs/Pleura: No pleural effusion, airspace consolidation or pneumothorax. Bilateral peripheral predominant interstitial reticulation and subpleural banding identified. No frank honeycombing or bronchiectasis identified. No suspicious pulmonary nodule or mass.  Upper Abdomen: Small cyst in left lobe of liver measures 1 cm. No acute abnormality.  Musculoskeletal: Spondylosis identified within the thoracic spine. No acute or suspicious findings.  IMPRESSION: 1. Upper limits of normal left posterior mediastinal lymph node measures 1.5 cm. 2. Decreased AP diameter of the trachea and bilateral mainstem bronchi which may be seen with chronic tracheobronchomalacia. 3. Chronic postinflammatory changes noted within both lungs including subpleural banding and interstitial reticulation.   Electronically Signed By: Signa Kell M.D. On: 11/26/2022 08:41   ECHOCARDIOGRAM  ECHOCARDIOGRAM COMPLETE 02/16/2023  Narrative ECHOCARDIOGRAM REPORT    Patient Name:   Robert [name] Date of Exam: 02/16/2023 Medical Rec #:  [id number]         Height:       71.0 in Accession #:    [id number]        Weight:       314.0 lb Date of Birth:  1950-09-15         BSA:          2.554 m Patient Age:    72 years          BP:           118/70 mmHg Patient Gender: M                 HR:           105 bpm. Exam Location:  [address]  Procedure: 2D Echo, Cardiac Doppler and Color Doppler  Indications:    1 month S/p TAVR Z95.2  History:        Patient has prior history of Echocardiogram examinations, most recent 01/07/2023. COPD, Arrythmias:Atrial Fibrillation; Risk Factors:Dyslipidemia. Aortic Valve: 34 Medtronic CoreValve-Evolut Pro prosthetic, stented (TAVR) valve is present in the aortic position. Procedure Date: 01/06/2023.  Sonographer:    Thurman Coyer RDCS Referring Phys: [phone] JILL D MCDANIEL  IMPRESSIONS   1. Left ventricular ejection fraction, by estimation, is 30 to 35%. The left ventricle has moderately decreased function. The left ventricle demonstrates global hypokinesis. Left ventricular diastolic function could not be evaluated. There is incordinate septal motion. 2. Right ventricular systolic function is mildly reduced. The right ventricular size is normal. There is normal pulmonary artery systolic pressure. The estimated right ventricular systolic pressure is 34.6 mmHg. 3. The mitral valve is abnormal. Mild mitral valve regurgitation. 4. The aortic valve has been repaired/replaced. Aortic valve regurgitation is not visualized. There is a 34 Medtronic CoreValve-Evolut Pro prosthetic (TAVR) valve present in the aortic position. Procedure Date: 01/06/2023. Echo findings are consistent with normal structure and function of the aortic valve  coronary artery distribution(s).   Findings are equivocal. The study is intermediate risk.   Despite normal perfusion study considered intermediate to high risk. Resting EF abnormal and decreases with stress. TID 1.3 with  abnormal MBF and severe calcium in LAD and RCA Baseline heart rhythm atrial fibrillation  CLINICAL DATA:  This over-read does not include interpretation of cardiac or coronary anatomy or pathology. The Cardiac PET CT interpretation by the cardiologist is attached.  COMPARISON:  None Available.  FINDINGS: Vascular: No acute abnormality.  Mediastinum/Nodes: Decreased AP diameter of the trachea and bilateral mainstem bronchi which may be seen with chronic tracheobronchomalacia. Lymph node posterior to the right superior pulmonary vein measures 1.5 cm, image 44/3  Lungs/Pleura: No pleural effusion, airspace consolidation or pneumothorax. Bilateral peripheral predominant interstitial reticulation and subpleural banding identified. No frank honeycombing or bronchiectasis identified. No suspicious pulmonary nodule or mass.  Upper Abdomen: Small cyst in left lobe of liver measures 1 cm. No acute abnormality.  Musculoskeletal: Spondylosis identified within the thoracic spine. No acute or suspicious findings.  IMPRESSION: 1. Upper limits of normal left posterior mediastinal lymph node measures 1.5 cm. 2. Decreased AP diameter of the trachea and bilateral mainstem bronchi which may be seen with chronic tracheobronchomalacia. 3. Chronic postinflammatory changes noted within both lungs including subpleural banding and interstitial reticulation.   Electronically Signed By: Signa Kell M.D. On: 11/26/2022 08:41   ECHOCARDIOGRAM  ECHOCARDIOGRAM COMPLETE 02/16/2023  Narrative ECHOCARDIOGRAM REPORT    Patient Name:   Robert [name] Date of Exam: 02/16/2023 Medical Rec #:  [id number]         Height:       71.0 in Accession #:    [id number]        Weight:       314.0 lb Date of Birth:  1950-09-15         BSA:          2.554 m Patient Age:    72 years          BP:           118/70 mmHg Patient Gender: M                 HR:           105 bpm. Exam Location:  [address]  Procedure: 2D Echo, Cardiac Doppler and Color Doppler  Indications:    1 month S/p TAVR Z95.2  History:        Patient has prior history of Echocardiogram examinations, most recent 01/07/2023. COPD, Arrythmias:Atrial Fibrillation; Risk Factors:Dyslipidemia. Aortic Valve: 34 Medtronic CoreValve-Evolut Pro prosthetic, stented (TAVR) valve is present in the aortic position. Procedure Date: 01/06/2023.  Sonographer:    Thurman Coyer RDCS Referring Phys: [phone] JILL D MCDANIEL  IMPRESSIONS   1. Left ventricular ejection fraction, by estimation, is 30 to 35%. The left ventricle has moderately decreased function. The left ventricle demonstrates global hypokinesis. Left ventricular diastolic function could not be evaluated. There is incordinate septal motion. 2. Right ventricular systolic function is mildly reduced. The right ventricular size is normal. There is normal pulmonary artery systolic pressure. The estimated right ventricular systolic pressure is 34.6 mmHg. 3. The mitral valve is abnormal. Mild mitral valve regurgitation. 4. The aortic valve has been repaired/replaced. Aortic valve regurgitation is not visualized. There is a 34 Medtronic CoreValve-Evolut Pro prosthetic (TAVR) valve present in the aortic position. Procedure Date: 01/06/2023. Echo findings are consistent with normal structure and function of the aortic valve  coronary artery distribution(s).   Findings are equivocal. The study is intermediate risk.   Despite normal perfusion study considered intermediate to high risk. Resting EF abnormal and decreases with stress. TID 1.3 with  abnormal MBF and severe calcium in LAD and RCA Baseline heart rhythm atrial fibrillation  CLINICAL DATA:  This over-read does not include interpretation of cardiac or coronary anatomy or pathology. The Cardiac PET CT interpretation by the cardiologist is attached.  COMPARISON:  None Available.  FINDINGS: Vascular: No acute abnormality.  Mediastinum/Nodes: Decreased AP diameter of the trachea and bilateral mainstem bronchi which may be seen with chronic tracheobronchomalacia. Lymph node posterior to the right superior pulmonary vein measures 1.5 cm, image 44/3  Lungs/Pleura: No pleural effusion, airspace consolidation or pneumothorax. Bilateral peripheral predominant interstitial reticulation and subpleural banding identified. No frank honeycombing or bronchiectasis identified. No suspicious pulmonary nodule or mass.  Upper Abdomen: Small cyst in left lobe of liver measures 1 cm. No acute abnormality.  Musculoskeletal: Spondylosis identified within the thoracic spine. No acute or suspicious findings.  IMPRESSION: 1. Upper limits of normal left posterior mediastinal lymph node measures 1.5 cm. 2. Decreased AP diameter of the trachea and bilateral mainstem bronchi which may be seen with chronic tracheobronchomalacia. 3. Chronic postinflammatory changes noted within both lungs including subpleural banding and interstitial reticulation.   Electronically Signed By: Signa Kell M.D. On: 11/26/2022 08:41   ECHOCARDIOGRAM  ECHOCARDIOGRAM COMPLETE 02/16/2023  Narrative ECHOCARDIOGRAM REPORT    Patient Name:   Robert [name] Date of Exam: 02/16/2023 Medical Rec #:  [id number]         Height:       71.0 in Accession #:    [id number]        Weight:       314.0 lb Date of Birth:  1950-09-15         BSA:          2.554 m Patient Age:    72 years          BP:           118/70 mmHg Patient Gender: M                 HR:           105 bpm. Exam Location:  [address]  Procedure: 2D Echo, Cardiac Doppler and Color Doppler  Indications:    1 month S/p TAVR Z95.2  History:        Patient has prior history of Echocardiogram examinations, most recent 01/07/2023. COPD, Arrythmias:Atrial Fibrillation; Risk Factors:Dyslipidemia. Aortic Valve: 34 Medtronic CoreValve-Evolut Pro prosthetic, stented (TAVR) valve is present in the aortic position. Procedure Date: 01/06/2023.  Sonographer:    Thurman Coyer RDCS Referring Phys: [phone] JILL D MCDANIEL  IMPRESSIONS   1. Left ventricular ejection fraction, by estimation, is 30 to 35%. The left ventricle has moderately decreased function. The left ventricle demonstrates global hypokinesis. Left ventricular diastolic function could not be evaluated. There is incordinate septal motion. 2. Right ventricular systolic function is mildly reduced. The right ventricular size is normal. There is normal pulmonary artery systolic pressure. The estimated right ventricular systolic pressure is 34.6 mmHg. 3. The mitral valve is abnormal. Mild mitral valve regurgitation. 4. The aortic valve has been repaired/replaced. Aortic valve regurgitation is not visualized. There is a 34 Medtronic CoreValve-Evolut Pro prosthetic (TAVR) valve present in the aortic position. Procedure Date: 01/06/2023. Echo findings are consistent with normal structure and function of the aortic valve  coronary artery distribution(s).   Findings are equivocal. The study is intermediate risk.   Despite normal perfusion study considered intermediate to high risk. Resting EF abnormal and decreases with stress. TID 1.3 with  abnormal MBF and severe calcium in LAD and RCA Baseline heart rhythm atrial fibrillation  CLINICAL DATA:  This over-read does not include interpretation of cardiac or coronary anatomy or pathology. The Cardiac PET CT interpretation by the cardiologist is attached.  COMPARISON:  None Available.  FINDINGS: Vascular: No acute abnormality.  Mediastinum/Nodes: Decreased AP diameter of the trachea and bilateral mainstem bronchi which may be seen with chronic tracheobronchomalacia. Lymph node posterior to the right superior pulmonary vein measures 1.5 cm, image 44/3  Lungs/Pleura: No pleural effusion, airspace consolidation or pneumothorax. Bilateral peripheral predominant interstitial reticulation and subpleural banding identified. No frank honeycombing or bronchiectasis identified. No suspicious pulmonary nodule or mass.  Upper Abdomen: Small cyst in left lobe of liver measures 1 cm. No acute abnormality.  Musculoskeletal: Spondylosis identified within the thoracic spine. No acute or suspicious findings.  IMPRESSION: 1. Upper limits of normal left posterior mediastinal lymph node measures 1.5 cm. 2. Decreased AP diameter of the trachea and bilateral mainstem bronchi which may be seen with chronic tracheobronchomalacia. 3. Chronic postinflammatory changes noted within both lungs including subpleural banding and interstitial reticulation.   Electronically Signed By: Signa Kell M.D. On: 11/26/2022 08:41   ECHOCARDIOGRAM  ECHOCARDIOGRAM COMPLETE 02/16/2023  Narrative ECHOCARDIOGRAM REPORT    Patient Name:   Robert [name] Date of Exam: 02/16/2023 Medical Rec #:  [id number]         Height:       71.0 in Accession #:    [id number]        Weight:       314.0 lb Date of Birth:  1950-09-15         BSA:          2.554 m Patient Age:    72 years          BP:           118/70 mmHg Patient Gender: M                 HR:           105 bpm. Exam Location:  [address]  Procedure: 2D Echo, Cardiac Doppler and Color Doppler  Indications:    1 month S/p TAVR Z95.2  History:        Patient has prior history of Echocardiogram examinations, most recent 01/07/2023. COPD, Arrythmias:Atrial Fibrillation; Risk Factors:Dyslipidemia. Aortic Valve: 34 Medtronic CoreValve-Evolut Pro prosthetic, stented (TAVR) valve is present in the aortic position. Procedure Date: 01/06/2023.  Sonographer:    Thurman Coyer RDCS Referring Phys: [phone] JILL D MCDANIEL  IMPRESSIONS   1. Left ventricular ejection fraction, by estimation, is 30 to 35%. The left ventricle has moderately decreased function. The left ventricle demonstrates global hypokinesis. Left ventricular diastolic function could not be evaluated. There is incordinate septal motion. 2. Right ventricular systolic function is mildly reduced. The right ventricular size is normal. There is normal pulmonary artery systolic pressure. The estimated right ventricular systolic pressure is 34.6 mmHg. 3. The mitral valve is abnormal. Mild mitral valve regurgitation. 4. The aortic valve has been repaired/replaced. Aortic valve regurgitation is not visualized. There is a 34 Medtronic CoreValve-Evolut Pro prosthetic (TAVR) valve present in the aortic position. Procedure Date: 01/06/2023. Echo findings are consistent with normal structure and function of the aortic valve  Cardiology Office Note:  .   Date:  04/09/2023  ID:  Robert [name], DOB 1950-12-31, MRN [id number] PCP: Tresa Garter, MD  [facility] Providers Cardiologist:  Jodelle Red, MD Electrophysiologist:  Nobie Putnam, MD    History of Present Illness: .   Robert [name] is a 72 y.o. male with hx of AS s/p TAVR 12/2022, atrial fibrillation (onset at time of COVID), prior DVT/PE, LBBB, nonobstructive CAD,  HLD, obesity, paroxysmal SVT.   Echo 11/2022 severe AS. LHC 12/16/22 mLAD 40% stenosis, 20% stenosis of ost-prox RCA. Underwent TAVR 01/06/23. Post op echo LVEF 50%, normally functioning TAVR. Coumadin was resumed.   Seen by TAVR team 02/16/23 with fatigue noted to be in atrial fibrillation. Echo LVEF 30-35%. [name] was started. At visit 03/06/23 with Dr. Cristal Deer, digoxin was initiated. He saw Dr. Jimmey Ralph of EP with echo scheduled in 1 month with plan to consider CRT-D vs Tikosyn. Noted prior intolerance to BB, Diltiazem. Previous intolerance to Amiodarone with hand swelling/thyroid abnormalities.   Presents today for follow up with his wife. Weight down 10 lbs from clinic visit 1 month ago. Notes blood pressure has been persistently hypotensive with readings 80s/40s associated with fatigue, near syncope. Taking Lasix 20mg  in the AM and then 2-3pm. Has been made dietary changes to follow more low sodium diet. Attributes prior potassium tablet to triggering gout. Notes his LE edema has been better.   ROS: Please see the history of present illness.    All other systems reviewed and are negative.   Studies Reviewed: .        Cardiac Studies & Procedures   CARDIAC CATHETERIZATION  CARDIAC CATHETERIZATION 12/16/2022  Narrative   Ost RCA to Prox RCA lesion is 20% stenosed.   Mid LAD lesion is 40% stenosed.  1.  Mild obstructive coronary artery disease. 2.  Fick cardiac output of 7.5 L/min and Fick cardiac index of 2.9 L/min/m with the following  hemodynamics: Right atrial pressure mean of 15 mmHg Right ventricular pressure 47/8 with end-diastolic pressure of 15 mmHg Wedge pressure mean 23 mmHg PA pressure 46/23 with a mean of 34 mmHg.  Recommendation: Continue evaluation for aortic valve intervention; increase Lasix to 20 mg twice daily and restart Coumadin tonight.  Findings Coronary Findings Diagnostic  Dominance: Right  Left Anterior Descending There is mild diffuse disease throughout the vessel. Mid LAD lesion is 40% stenosed.  Left Circumflex The vessel exhibits minimal luminal irregularities.  Right Coronary Artery The vessel exhibits minimal luminal irregularities. Ost RCA to Prox RCA lesion is 20% stenosed.  Intervention  No interventions have been documented.   STRESS TESTS  NM PET CT CARDIAC PERFUSION MULTI W/ABSOLUTE BLOODFLOW 11/26/2022  Narrative   LV perfusion is normal. There is no evidence of ischemia. There is no evidence of infarction.   Rest left ventricular function is abnormal. Rest global function is moderately reduced. There were no regional wall motion abnormalities. Rest EF: 40 %. Stress left ventricular function is abnormal. Stress global function is moderately reduced. There were no regional wall abnormalities. Stress EF: 33 %. End diastolic cavity size is mildly enlarged. End systolic cavity size is mildly enlarged.   Myocardial blood flow was computed to be 0.76ml/g/min at rest and 1.92ml/g/min at stress. Global myocardial blood flow reserve was 1.92 and was mildly abnormal.   Coronary calcium was present on the attenuation correction CT images. Severe coronary calcifications were present. Coronary calcifications were present in the left anterior descending artery and right

## 2023-04-07 NOTE — Patient Instructions (Addendum)
Medication Instructions:  Your physician has recommended you make the following change in your medication:   CHANGE Furosemide to 20mg  daily. Take additional 20mg  in the afternoon only as needed for weight gain of 2lbs overnight or 5 lbs in one week.   We are reducing to try to prevent low blood pressure. If you find your blood pressure is persistently <110/60 a couple weeks after making this change, please let us know and we may consider further medication changes.   *If you need a refill on your cardiac medications before your next appointment, please call your pharmacy*  Follow-Up: At [facility], you and your health needs are our priority.  As part of our continuing mission to provide you with exceptional heart care, we have created designated Provider Care Teams.  These Care Teams include your primary Cardiologist (physician) and Advanced Practice Providers (APPs -  Physician Assistants and Nurse Practitioners) who all work together to provide you with the care you need, when you need it.  We recommend signing up for the patient portal called "MyChart".  Sign up information is provided on this After Visit Summary.  MyChart is used to connect with patients for Virtual Visits (Telemedicine).  Patients are able to view lab/test results, encounter notes, upcoming appointments, etc.  Non-urgent messages can be sent to your provider as well.   To learn more about what you can do with MyChart, go to [email or web address].    Your next appointment:   3 months   Provider:   Jodelle Red, MD    Other Instructions  If your heart muscle does not improve on your next echocardiogram we could consider a group of medication to help further strengthen your heart muscle called SGLT2i Marcelline Deist).   Dapagliflozin Tablets What is this medication? DAPAGLIFLOZIN (DAP a gli FLOE zin) treats type 2 diabetes. It works by helping your kidneys remove sugar (glucose) from your blood through  the urine, which decreases your blood sugar. It may also be used to lower the risk of worsening disease and death caused by kidney disease and heart failure. It works by helping your kidneys remove salt (sodium) from your blood through the urine. This decreases the amount of work the kidneys and heart have to do. Changes to diet and exercise are often combined with this medication. This medicine may be used for other purposes; ask your health care provider or pharmacist if you have questions. COMMON BRAND NAME(S): Marcelline Deist What should I tell my care team before I take this medication? They need to know if you have any of these conditions: Change in diet, eating less Changes to your insulin dose Dehydration Diet low in salt Frequently drink alcohol Having surgery History of diabetic ketoacidosis (DKA) History of genital infections History of pancreatitis or pancreas problems History of urinary tract infections (UTI) Kidney disease Liver disease Serious infection Trouble passing urine Type 1 diabetes An unusual or allergic reaction to dapagliflozin, other medications, foods, dyes, or preservatives Pregnant or trying to get pregnant Breastfeeding How should I use this medication? Take this medication by mouth with water. Take it as directed on the prescription label at the same time every day. You can take it with or without food. If it upsets your stomach, take it with food. Keep taking it unless your care team tells you to stop. A special MedGuide will be given to you by the pharmacist with each prescription and refill. Be sure to read this information carefully each time. Talk to your care  team about the use of this medication in children. Special care may be needed. Overdosage: If you think you have taken too much of this medicine contact a poison control center or emergency room at once. NOTE: This medicine is only for you. Do not share this medicine with others. What if I miss a  dose? If you miss a dose, take it as soon as you can. If it is almost time for your next dose, take only that dose. Do not take double or extra doses. What may interact with this medication? Lithium Sulfonylureas, such as glimepiride, glipizide, glyburide This list may not describe all possible interactions. Give your health care provider a list of all the medicines, herbs, non-prescription drugs, or dietary supplements you use. Also tell them if you smoke, drink alcohol, or use illegal drugs. Some items may interact with your medicine. What should I watch for while using this medication? Visit your care team for regular checks on your progress. Tell your care team if your symptoms do not start to get better or if they get worse. You may need blood work done while you are taking this medication. Your care team will monitor your HbA1C (A1C). This test shows what your average blood sugar (glucose) level was over the past 2 to 3 months. Know the symptoms of low blood sugar and know how to treat it. Always carry a source of quick sugar with you. Examples include hard sugar candy or glucose tablets. Make sure others know that you can choke if you eat or drink if your blood sugar is too low and you are unable to care for yourself. Get medical help at once. Tell your care team if you have high blood sugar. Your medication dose may change if your body is under stress. Some types of stress that may affect your blood sugar include fever, infection, and surgery. This medication may increase the risk of diabetic ketoacidosis (DKA), a serious condition in which high ketone levels make your blood too acidic. Your care team may ask you to check for ketones in your urine or blood while you are taking this medication. If you develop nausea, vomiting, stomach pain, unusual weakness or fatigue, or trouble breathing, stop taking this medication and call your care team right away. Check with your care team if you have severe  diarrhea, nausea, and vomiting, or if you sweat a lot. The loss of too much body fluid may make it dangerous for you to take this medication. Wear a medical ID bracelet or chain. Carry a card that describes your condition. List the medications and doses you take on the card. What side effects may I notice from receiving this medication? Side effects that you should report to your care team as soon as possible: Allergic reactions--skin rash, itching, hives, swelling of the face, lips, tongue, or throat Dehydration--increased thirst, dry mouth, feeling faint or lightheaded, headache, dark yellow or brown urine Diabetic ketoacidosis (DKA)--increased thirst or amount of urine, dry mouth, fatigue, fruity odor to breath, trouble breathing, stomach pain, nausea, vomiting Genital yeast infection--redness, swelling, pain, or itchiness, odor, thick or lumpy discharge New pain or tenderness, change in skin color, sores or ulcers, infection of the leg or foot Infection or redness, swelling, tenderness, or pain in the genitals, or area from the genitals to the back of the rectum Urinary tract infection (UTI)--burning when passing urine, passing frequent small amounts of urine, bloody or cloudy urine, pain in the lower back or sides This list  may not describe all possible side effects. Call your doctor for medical advice about side effects. You may report side effects to FDA at 1-800-FDA-1088. Where should I keep my medication? Keep out of the reach of children and pets. Store at room temperature between 20 and 25 degrees C (68 and 77 degrees F). Get rid of any unused medication after the expiration date. To get rid of medications that are no longer needed or have expired: Take the medication to a medication take-back program. Check with your pharmacy or law enforcement to find a location. If you cannot return the medication, check the label or package insert to see if the medication should be thrown out in the  garbage or flushed down the toilet. If you are not sure, ask your care team. If it is safe to put it in the trash, take the medication out of the container. Mix the medication with cat litter, dirt, coffee grounds, or other unwanted substance. Seal the mixture in a bag or container. Put it in the trash. NOTE: This sheet is a summary. It may not cover all possible information. If you have questions about this medicine, talk to your doctor, pharmacist, or health care provider.  2024 [employer] Standard (2022-09-17 00:00:00)

## 2023-04-08 ENCOUNTER — Encounter (HOSPITAL_COMMUNITY)
Admission: RE | Admit: 2023-04-08 | Discharge: 2023-04-08 | Disposition: A | Discharge status: Home / Self Care | Payer: MEDICARE | Source: Ambulatory Visit | Attending: Cardiology | Admitting: Cardiology

## 2023-04-08 DIAGNOSIS — Z48812 Encounter for surgical aftercare following surgery on the circulatory system: Secondary | ICD-10-CM | POA: Diagnosis not present

## 2023-04-08 DIAGNOSIS — Z952 Presence of prosthetic heart valve: Secondary | ICD-10-CM

## 2023-04-09 DIAGNOSIS — H34831 Tributary (branch) retinal vein occlusion, right eye, with macular edema: Secondary | ICD-10-CM | POA: Diagnosis not present

## 2023-04-10 ENCOUNTER — Encounter (HOSPITAL_COMMUNITY)
Admission: RE | Admit: 2023-04-10 | Discharge: 2023-04-10 | Disposition: A | Discharge status: Home / Self Care | Payer: MEDICARE | Source: Ambulatory Visit | Attending: Cardiology | Admitting: Cardiology

## 2023-04-10 ENCOUNTER — Ambulatory Visit (INDEPENDENT_AMBULATORY_CARE_PROVIDER_SITE_OTHER): Payer: MEDICARE

## 2023-04-10 DIAGNOSIS — Z952 Presence of prosthetic heart valve: Secondary | ICD-10-CM | POA: Diagnosis not present

## 2023-04-10 DIAGNOSIS — Z7901 Long term (current) use of anticoagulants: Secondary | ICD-10-CM

## 2023-04-10 DIAGNOSIS — Z48812 Encounter for surgical aftercare following surgery on the circulatory system: Secondary | ICD-10-CM | POA: Diagnosis not present

## 2023-04-10 LAB — POCT INR: INR: 3 (ref 2.0–3.0)

## 2023-04-10 NOTE — Patient Instructions (Addendum)
Pre visit review using our clinic review tool, if applicable. No additional management support is needed unless otherwise documented below in the visit note.  Continue 1/2 tablet daily except take 1 tablet Fridays.  Recheck in 1 weeks.

## 2023-04-10 NOTE — Progress Notes (Signed)
Pt was started on digoxin. Will recheck INR in 1 week due to pt being at top of range. Continue 1/2 tablet daily except take 1 tablet Fridays.  Recheck in 1 weeks.

## 2023-04-13 ENCOUNTER — Encounter (HOSPITAL_COMMUNITY)
Admission: RE | Admit: 2023-04-13 | Discharge: 2023-04-13 | Disposition: A | Discharge status: Home / Self Care | Payer: MEDICARE | Source: Ambulatory Visit | Attending: Cardiology

## 2023-04-13 DIAGNOSIS — Z48812 Encounter for surgical aftercare following surgery on the circulatory system: Secondary | ICD-10-CM | POA: Diagnosis not present

## 2023-04-13 DIAGNOSIS — Z952 Presence of prosthetic heart valve: Secondary | ICD-10-CM

## 2023-04-14 NOTE — Progress Notes (Signed)
Cardiac Individual Treatment Plan  Patient Details  Name: MALLOY COLLETTA MRN: [id number] Date of Birth: 07-05-1950 Referring Provider:   Flowsheet Row INTENSIVE CARDIAC REHAB ORIENT from 01/22/2023 in [facility]  Referring Provider Jodelle Red, MD       Initial Encounter Date:  Flowsheet Row INTENSIVE CARDIAC REHAB ORIENT from 01/22/2023 in [facility]  Date 01/22/23       Visit Diagnosis: 01/06/23 S/P TAVR (transcatheter aortic valve replacement)  Patient's Home Medications on Admission:  Current Outpatient Medications:    albuterol (VENTOLIN HFA) 108 (90 Base) MCG/ACT inhaler, Inhale 2 puffs into the lungs every 6 (six) hours as needed for wheezing or shortness of breath., Disp: 6.7 g, Rfl: 0   allopurinol (ZYLOPRIM) 100 MG tablet, Take 1 tablet by mouth once daily, Disp: 90 tablet, Rfl: 0   celecoxib (CELEBREX) 200 MG capsule, Take 1 capsule (200 mg total) by mouth 2 (two) times daily. (Patient taking differently: Take 200 mg by mouth as needed for moderate pain (pain score 4-6).), Disp: 14 capsule, Rfl: 0   cetirizine (ZYRTEC) 10 MG tablet, Take 1 tablet by mouth once daily (Patient taking differently: Take 10 mg by mouth at bedtime.), Disp: 30 tablet, Rfl: 5   Cholecalciferol (VITAMIN D3) 125 MCG (5000 UT) CAPS, Take 1 capsule (5,000 Units total) by mouth daily. (Patient taking differently: Take 1 capsule by mouth as needed.), Disp: 100 capsule, Rfl: 3   colchicine 0.6 MG tablet, TAKE 2 TABLETS BY MOUTH AS NEEDED FOR  GOUT  ATTACK  THEN  TAKE  ANOTHER  ONE  TABLET  IN  1  TO  2  HOURS.  DO  NOT  REPEAT  FOR  3  DAYS., Disp: 18 tablet, Rfl: 0   digoxin (LANOXIN) 0.125 MG tablet, Take 1 tablet (0.125 mg total) by mouth daily., Disp: 90 tablet, Rfl: 3   doxycycline (MONODOX) 100 MG capsule, Take 1 capsule (100 mg total) by mouth as directed. 1 hour before any  dental work, including cleanings., Disp: 6 capsule, Rfl: 6   famotidine (PEPCID) 40 MG tablet, Take 1 tablet by mouth once daily (Patient taking differently: Take 40 mg by mouth daily as needed for heartburn or indigestion.), Disp: 30 tablet, Rfl: 5   faricimab-svoa (VABYSMO) 6 MG/0.05ML SOLN intravitreal injection, 6 mg by Intravitreal route once. Injections every 8 weeks, Disp: , Rfl:    fish oil-omega-3 fatty acids 1000 MG capsule, Take 1 g by mouth every morning., Disp: , Rfl:    furosemide (LASIX) 20 MG tablet, Take 1 tablet (20 mg total) by mouth daily. May take an additional tablet in the afternoon as needed for weight gain of 2 lbs overnight or 5 lbs in one week., Disp: 45 tablet, Rfl: 5   glucosamine-chondroitin 500-400 MG tablet, Take 1 tablet by mouth every morning., Disp: , Rfl:    mupirocin ointment (BACTROBAN) 2 %, Place 1 Application into the nose as needed., Disp: , Rfl:    nortriptyline (PAMELOR) 25 MG capsule, TAKE 1 CAPSULE AT BEDTIME, Disp: 90 capsule, Rfl: 3   predniSONE (DELTASONE) 5 MG tablet, TAKE 1 TABLET DAILY WITH BREAKFAST, Disp: 90 tablet, Rfl: 3   pregabalin (LYRICA) 75 MG capsule, Take 1 capsule by mouth three times daily as needed (Patient taking differently: as needed.), Disp: 90 capsule, Rfl: 1   sacubitril-valsartan (ENTRESTO) 24-26 MG, Take 1 tablet by mouth 2 (  two) times daily., Disp: 60 tablet, Rfl: 2   triamcinolone cream (KENALOG) 0.1 %, Apply 1 Application topically 3 (three) times daily. (Patient taking differently: Apply 1 Application topically daily as needed (irritation).), Disp: 450 g, Rfl: 1   warfarin (COUMADIN) 3 MG tablet, TAKE 1 TABLET DAILY OR TAKE AS DIRECTED BY ANTICOAGULATION CLINIC (Patient taking differently: Take 3 mg by mouth See admin instructions. TAKE 1 TABLET DAILY OR TAKE AS DIRECTED BY ANTICOAGULATION CLINIC; 6 mg (6 mg x 1) every Fri; 3 mg (6 mg x 0.5) all other days), Disp: 90 tablet, Rfl: 1   warfarin (COUMADIN) 6 MG tablet, TAKE  ONE-HALF (1/2) TABLET DAILY EXCEPT TAKE 1 TABLET ON FRIDAYS OR AS DIRECTED BY ANTICOAGULATION CLINIC, Disp: 60 tablet, Rfl: 1  Past Medical History: Past Medical History:  Diagnosis Date   Anxiety    Asthma    "no attack since acupuncture in 1990's"   Atrial fibrillation (HCC)    Complication of anesthesia    SLOW TO WAKE UP / DIFFICULTY RESPONDING 2003, 3 days before could use left arm, "wild/crazy sometimes"   COPD (chronic obstructive pulmonary disease) (HCC)    Cyst of left kidney    "benign"   DVT (deep venous thrombosis) (HCC) 06/23/2001   right femoral artery "from groin to knee"   Edema    Right Leg w/ h/o DVT postphlebitic   H/O blood transfusion reaction 06/23/2002   FFP, extreme swelling, could not breath   History of pulmonary embolism 06/23/2001   both lungs   Hyperlipidemia    Knee pain    left   LBP (low back pain)    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 Lynnette Caffey and Dr. Leafy Ro   Severe aortic stenosis    Spondylolisthesis    Warfarin anticoagulation    Wheezing    when laying on left side    Tobacco Use: Social History   Tobacco Use  Smoking Status Never  Smokeless Tobacco Never    Labs: Review Flowsheet  More data exists      Latest Ref Rng & Units 04/04/2021 11/12/2021 03/17/2022 12/16/2022 01/06/2023  Labs for ITP Cardiac and Pulmonary Rehab  Cholestrol 0 - 200 mg/dL - - 010  - -  LDL (calc) 0 - 99 mg/dL - - 272  - -  HDL-C >53.66 mg/dL - - 44.03  - -  Trlycerides 0.0 - 149.0 mg/dL - - 474.2  - -  Hemoglobin A1c 4.6 - 6.5 % 6.1  5.8  - - -  PH, Arterial 7.35 - 7.45 - - - 7.376  -  PCO2 arterial 32 - 48 mmHg - - - 39.5  -  Bicarbonate 20.0 - 28.0 mmol/L - - - 25.8  25.2  23.1  -  TCO2 22 - 32 mmol/L - -  - 27  27  24   32   Acid-base deficit 0.0 - 2.0 mmol/L - - - 1.0  2.0  -  O2 Saturation % - - - 74  76  96  -    Details       Multiple values from one day are sorted in reverse-chronological order  Capillary Blood Glucose: Lab Results  Component Value Date   GLUCAP 231 (H) 04/05/2013   GLUCAP 132 (H) 04/05/2013   GLUCAP 110 (H) 05/21/2010     Exercise Target Goals: Exercise Program Goal: Individual exercise prescription set using results from initial 6 min walk test and THRR while considering  patient's activity barriers and safety.   Exercise Prescription Goal: Initial exercise prescription builds to 30-45 minutes a day of aerobic activity, 2-3 days per week.  Home exercise guidelines will be given to patient during program as part of exercise prescription that the participant will acknowledge.  Activity Barriers & Risk Stratification:  Activity Barriers & Cardiac Risk Stratification - 01/22/23 1231       Activity Barriers & Cardiac Risk Stratification   Activity Barriers Arthritis;Back Problems;Joint Problems;Deconditioning;Muscular Weakness;Shortness of Breath;Balance Concerns    Cardiac Risk Stratification High             6 Minute Walk:  6 Minute Walk     Row Name 01/22/23 1126 04/10/23 1254       6 Minute Walk   Phase Initial Discharge  Used Go-cart    Distance 480 feet 1092 feet  Used Go-cart    Distance % Change -- 127.5 %    Distance Feet Change -- 612 ft    Walk Time 6 minutes 6 minutes    # of Rest Breaks 2  2 breaks totaling 2:07 due to going over THR 0    MPH 1 2.1    METS 1.7 1.7  previous METS of 1.7 was an error, METs were 1.0    RPE 9 11    Perceived Dyspnea  1 1    VO2 Peak 1.03 2.9    Symptoms Yes (comment) Yes (comment)    Comments SOB, RPD = 1 SOB, RPD = 1    Resting HR 81 bpm 86 bpm    Resting BP 112/74 116/70    Resting Oxygen Saturation  95 % --    Exercise Oxygen Saturation  during 6 min walk 94 % --    Max Ex. HR 134  bpm 98 bpm    Max Ex. BP 144/78 140/70    2 Minute Post BP 124/80 --      Interval HR   1 Minute HR 114 --    2 Minute HR 129 --    3 Minute HR 114 --    4 Minute HR 112 --    5 Minute HR 126 --    6 Minute HR 114 --    2 Minute Post HR 87 --    Interval Heart Rate? Yes --      Interval Oxygen   Interval Oxygen? Yes --    Baseline Oxygen Saturation % 95 % --    1 Minute Oxygen Saturation % 97 % --    1 Minute Liters of Oxygen 0 L --    2 Minute Oxygen Saturation % 94 % --    2 Minute Liters of Oxygen 0 L --    3 Minute Oxygen Saturation % 96 % --    3 Minute Liters of Oxygen 0 L --    4 Minute Oxygen Saturation % 96 % --    4 Minute Liters of Oxygen 0 L --    5 Minute Oxygen Saturation % 95 % --    5 Minute Liters of Oxygen 0 L --    6 Minute Oxygen Saturation % 97 % --  6 Minute Liters of Oxygen 0 L --             Oxygen Initial Assessment:   Oxygen Re-Evaluation:   Oxygen Discharge (Final Oxygen Re-Evaluation):   Initial Exercise Prescription:  Initial Exercise Prescription - 01/22/23 1200       Date of Initial Exercise RX and Referring Provider   Date 01/22/23    Referring Provider Jodelle Red, MD    Expected Discharge Date 04/15/23      T5 Nustep   Level 1    SPM 70    Minutes 25    METs 1.7      Prescription Details   Frequency (times per week) 3    Duration Progress to 30 minutes of continuous aerobic without signs/symptoms of physical distress      Intensity   THRR 40-80% of Max Heartrate 60-119    Ratings of Perceived Exertion 11-13    Perceived Dyspnea 0-4      Progression   Progression Continue progressive overload as per policy without signs/symptoms or physical distress.      Resistance Training   Training Prescription Yes    Weight 3 lbs    Reps 10-15             Perform Capillary Blood Glucose checks as needed.  Exercise Prescription Changes:   Exercise Prescription Changes     Row Name 01/26/23 1600  02/18/23 1400 02/26/23 1500 02/27/23 1400 03/18/23 1500     Response to Exercise   Blood Pressure (Admit) 114/78 140/68 114/76 118/68 130/60   Blood Pressure (Exercise) 122/78 138/80 134/80 134/68 148/60   Blood Pressure (Exit) 108/70 112/70 122/70 100/62 110/60   Heart Rate (Admit) 101 bpm 107 bpm 90 bpm 100 bpm 141 bpm   Heart Rate (Exercise) 147 bpm 159 bpm 115 bpm 183 bpm 125 bpm   Heart Rate (Exit) 105 bpm 113 bpm 105 bpm 109 bpm 92 bpm   Rating of Perceived Exertion (Exercise) 9 11 13 11 13    Symptoms None None None None None   Comments Pt's first day in the CRP2 program. Reviewed METs Reviewed METs and goals Reviewed Home exercise Rx Reviewed METs   Duration Progress to 30 minutes of  aerobic without signs/symptoms of physical distress Continue with 30 min of aerobic exercise without signs/symptoms of physical distress. Progress to 30 minutes of  aerobic without signs/symptoms of physical distress Continue with 30 min of aerobic exercise without signs/symptoms of physical distress. Continue with 30 min of aerobic exercise without signs/symptoms of physical distress.   Intensity THRR unchanged THRR unchanged THRR unchanged THRR unchanged THRR unchanged     Progression   Progression Continue to progress workloads to maintain intensity without signs/symptoms of physical distress. Continue to progress workloads to maintain intensity without signs/symptoms of physical distress. Continue to progress workloads to maintain intensity without signs/symptoms of physical distress. Continue to progress workloads to maintain intensity without signs/symptoms of physical distress. Continue to progress workloads to maintain intensity without signs/symptoms of physical distress.   Average METs 2.1 4.2 3.6 3.6 4.3     Resistance Training   Training Prescription No No No Yes No   Weight Weights held due to increased HR No weights on Wednesdays No weights on Wednesdays 4 lbs No weights on Wed   Reps -- -- --  10-15 --   Time -- -- -- 10 Minutes --     Interval Training   Interval Training No No No Yes No  T5 Nustep   Level 1 1 1 1 4    SPM 80 127 133 132 133   Minutes 20 30 30 30 30    METs 2.1 4.2 3.6 3.6 4.3     Home Exercise Plan   Plans to continue exercise at -- -- -- Home (comment) --   Frequency -- -- -- Add 2 additional days to program exercise sessions. --   Initial Home Exercises Provided -- -- -- 02/27/23 --    Row Name 04/06/23 1500             Response to Exercise   Blood Pressure (Admit) 128/72       Blood Pressure (Exit) 96/52       Heart Rate (Admit) 82 bpm       Heart Rate (Exercise) 122 bpm       Heart Rate (Exit) 93 bpm       Rating of Perceived Exertion (Exercise) 13.5       Symptoms None       Comments Reviewed METs and goals       Duration Continue with 30 min of aerobic exercise without signs/symptoms of physical distress.       Intensity THRR unchanged         Progression   Progression Continue to progress workloads to maintain intensity without signs/symptoms of physical distress.       Average METs 4.5         Resistance Training   Training Prescription No       Weight No weights on Wed         Interval Training   Interval Training No         T5 Nustep   Level 4       SPM 136       Minutes 30       METs 4.5         Home Exercise Plan   Plans to continue exercise at Home (comment)       Frequency Add 2 additional days to program exercise sessions.       Initial Home Exercises Provided 02/27/23                Exercise Comments:   Exercise Comments     Row Name 01/26/23 1619 02/18/23 1437 02/26/23 1534 02/27/23 1442 03/18/23 1519   Exercise Comments Pt's first day in the CRP2 program. Pt is very deconditioned. During warm up and and after completing nustep pt was over THRR. Weights were held due to this reason. Pt will most likely have to do warm-ups, cool-down and weights form the chair until he adjusts to the exercise bout.  Reviewed METs. Pt is making good progress on increasing MET level with exercise. Reviewed METs and goals. Pt continues to progess toward goals. Reviewed home exercise Rx. Pt's plan is to walk at home morning and evening, will work up to a total of 15 minutes per walk. will do 2x/week. Verbalized understanding of the home exericse Rx. Reviewed METs with patient today, pt is steadily increasing his METs in the program. He averaged 4.3 METs today.    Row Name 04/08/23 1500           Exercise Comments Reviewed METs and goals with patient. Pt is making good progress.                Exercise Goals and Review:   Exercise Goals     Row Name 01/22/23 [phone]  Exercise Goals   Increase Physical Activity Yes       Intervention Provide advice, education, support and counseling about physical activity/exercise needs.;Develop an individualized exercise prescription for aerobic and resistive training based on initial evaluation findings, risk stratification, comorbidities and participant's personal goals.       Expected Outcomes Short Term: Attend rehab on a regular basis to increase amount of physical activity.;Long Term: Add in home exercise to make exercise part of routine and to increase amount of physical activity.;Long Term: Exercising regularly at least 3-5 days a week.       Increase Strength and Stamina Yes       Intervention Provide advice, education, support and counseling about physical activity/exercise needs.;Develop an individualized exercise prescription for aerobic and resistive training based on initial evaluation findings, risk stratification, comorbidities and participant's personal goals.       Expected Outcomes Short Term: Increase workloads from initial exercise prescription for resistance, speed, and METs.;Short Term: Perform resistance training exercises routinely during rehab and add in resistance training at home;Long Term: Improve cardiorespiratory fitness,  muscular endurance and strength as measured by increased METs and functional capacity ( )       Able to understand and use rate of perceived exertion (RPE) scale Yes       Intervention Provide education and explanation on how to use RPE scale       Expected Outcomes Short Term: Able to use RPE daily in rehab to express subjective intensity level;Long Term:  Able to use RPE to guide intensity level when exercising independently       Knowledge and understanding of Target Heart Rate Range (THRR) Yes       Intervention Provide education and explanation of THRR including how the numbers were predicted and where they are located for reference       Expected Outcomes Short Term: Able to state/look up THRR;Short Term: Able to use daily as guideline for intensity in rehab;Long Term: Able to use THRR to govern intensity when exercising independently       Understanding of Exercise Prescription Yes       Intervention Provide education, explanation, and written materials on patient's individual exercise prescription       Expected Outcomes Short Term: Able to explain program exercise prescription;Long Term: Able to explain home exercise prescription to exercise independently                Exercise Goals Re-Evaluation :  Exercise Goals Re-Evaluation     Row Name 01/26/23 1611 02/26/23 1532 03/18/23 1517 04/08/23 1500       Exercise Goal Re-Evaluation   Exercise Goals Review Increase Physical Activity;Able to understand and use Dyspnea scale;Increase Strength and Stamina;Knowledge and understanding of Target Heart Rate Range (THRR);Able to understand and use rate of perceived exertion (RPE) scale Increase Physical Activity;Able to understand and use Dyspnea scale;Increase Strength and Stamina;Knowledge and understanding of Target Heart Rate Range (THRR);Able to understand and use rate of perceived exertion (RPE) scale -- Increase Physical Activity;Able to understand and use Dyspnea scale;Increase  Strength and Stamina;Knowledge and understanding of Target Heart Rate Range (THRR);Able to understand and use rate of perceived exertion (RPE) scale    Comments Pt's first day in the CRP2 program. Pt understands the exercise Rx, THRR, and RPE scale. Reviewed METs and goals. Pt voices progress on his goal of increased strength and stamina. Pt's goal to improve walking is a "work in progress". Pt has also had goal of weight loss and has  lost 5.5 kg since starting the program. Reviewed METs with patient, Pt is steadily increasing his METs in the program, he averaged 4.3 METs today and increased his level to 4 on the NuStep. Reviewed METs and goals. Peak METs are 4.6.  Pt has imrpovred his strength and stamina which is a goal. Pt has goal of weight loss and has lost 12 kg to date. Pt is trying to get back to walking.    Expected Outcomes Will continue to monitor patient and progress exercise workloads as tolerated. Will continue to monitor patient and progress exercise workloads as tolerated. Will continue to progress workload as tolerated. Will continue to progress workload as tolerated.             Discharge Exercise Prescription (Final Exercise Prescription Changes):  Exercise Prescription Changes - 04/06/23 1500       Response to Exercise   Blood Pressure (Admit) 128/72    Blood Pressure (Exit) 96/52    Heart Rate (Admit) 82 bpm    Heart Rate (Exercise) 122 bpm    Heart Rate (Exit) 93 bpm    Rating of Perceived Exertion (Exercise) 13.5    Symptoms None    Comments Reviewed METs and goals    Duration Continue with 30 min of aerobic exercise without signs/symptoms of physical distress.    Intensity THRR unchanged      Progression   Progression Continue to progress workloads to maintain intensity without signs/symptoms of physical distress.    Average METs 4.5      Resistance Training   Training Prescription No    Weight No weights on Wed      Interval Training   Interval Training No       T5 Nustep   Level 4    SPM 136    Minutes 30    METs 4.5      Home Exercise Plan   Plans to continue exercise at Home (comment)    Frequency Add 2 additional days to program exercise sessions.    Initial Home Exercises Provided 02/27/23             Nutrition:  Target Goals: Understanding of nutrition guidelines, daily intake of sodium 1500mg , cholesterol 200mg , calories 30% from fat and 7% or less from saturated fats, daily to have 5 or more servings of fruits and vegetables.  Biometrics:  Pre Biometrics - 01/22/23 1000       Pre Biometrics   Waist Circumference 55.25 inches    Hip Circumference 52 inches    Waist to Hip Ratio 1.06 %    Triceps Skinfold 34 mm    % Body Fat 45.3 %    Grip Strength 34 kg    Flexibility --   Not performed due to significant back issues   Single Leg Stand 1.18 seconds              Nutrition Therapy Plan and Nutrition Goals:  Nutrition Therapy & Goals - 03/27/23 1017       Nutrition Therapy   Diet Heart Healthy Diet    Drug/Food Interactions Coumadin/Vit K      Personal Nutrition Goals   Nutrition Goal Patient to identify strategies for reducing cardiovascular risk by attending the [facility] education and nutrition series weekly.   goal in action.   Personal Goal #2 Patient to improve diet quality by using the plate method as a guide for meal planning to include lean protein/plant protein, fruits, vegetables, whole grains, nonfat dairy as  part of a well-balanced diet.   goal in progress.   Personal Goal #3 Patient to reduce sodium intake to 1500mg  per day   goal in progress.   Personal Goal #4 Patient to identify strategies for weight loss of 0.5-2.0# per week.   goal in progress.   Comments Goals in progress. Elijah Birk continues to attend the [facility] education and nutrition series regularly. He has started making many dietary changes including increased dietary fiber, decreased saturated fat intake, and decreased sodium. He is  down 22# since starting with our program. He continues follow-up with anti-coagulation clinic regularly.  9/13 Cardiology note, patient declines cardioversion and statin therapy. Patient will benefit from participation in intensive cardiac rehab for nutrition, exercise, and lifestyle modification.      Intervention Plan   Intervention Prescribe, educate and counsel regarding individualized specific dietary modifications aiming towards targeted core components such as weight, hypertension, lipid management, diabetes, heart failure and other comorbidities.;Nutrition handout(s) given to patient.    Expected Outcomes Short Term Goal: Understand basic principles of dietary content, such as calories, fat, sodium, cholesterol and nutrients.;Long Term Goal: Adherence to prescribed nutrition plan.             Nutrition Assessments:  Nutrition Assessments - 01/26/23 1629       Rate Your Plate Scores   Pre Score 63            MEDIFICTS Score Key: >=70 Need to make dietary changes  40-70 Heart Healthy Diet <= 40 Therapeutic Level Cholesterol Diet   Flowsheet Row INTENSIVE CARDIAC REHAB from 01/26/2023 in [facility]  Picture Your Plate Total Score on Admission 63      Picture Your Plate Scores: <82 Unhealthy dietary pattern with much room for improvement. 41-50 Dietary pattern unlikely to meet recommendations for good health and room for improvement. 51-60 More healthful dietary pattern, with some room for improvement.  >60 Healthy dietary pattern, although there may be some specific behaviors that could be improved.    Nutrition Goals Re-Evaluation:  Nutrition Goals Re-Evaluation     Row Name 01/26/23 1624 02/26/23 0956 03/27/23 1017         Goals   Current Weight 307 lb 15.7 oz (139.7 kg) 298 lb 11.6 oz (135.5 kg) 288 lb 12.8 oz (131 kg)     Comment triglycerides 168, HDL 36, LDL 121, A1c 5.8. He continues to see the  anti-coagulation clinic regularly BNP 827, other most recent labs triglycerides 168, HDL 36, LDL 121, A1c 5.8. most recent labs triglycerides 168, HDL 36, LDL 121, A1c 5.8, BNP 827     Expected Outcome [name] reports that he has started making some dietary changes including increased fish intake and decreased fast food. He is motivated to lose weight and improve eating habits.Patient will benefit from participation in intensive cardiac rehab for nutrition, exercise, and lifestyle modification. Goals in progress. Elijah Birk continues to attend the [facility] education and nutrition series regularly. He has started making many dietary changes including increased dietary fiber, decreased saturated fat intake, and decreased sodium. He is down 12.1# since starting with our program. He continues follow-up with anti-coagulation clinic regularly. 8/26 Cardiology note, "Per review of Dr. Di Kindle last note, he cannot tolerate diltiazem or beta blockers and has declined cardioversion in the past". Patient will benefit from participation in intensive cardiac rehab for nutrition, exercise, and lifestyle modification. Goals in progress. Elijah Birk continues to attend the [facility] education and nutrition series  regularly. He has started making many dietary changes including increased dietary fiber, decreased saturated fat intake, and decreased sodium. He is down 22# since starting with our program. He continues follow-up with anti-coagulation clinic regularly. 9/13 Cardiology note, patient declines cardioversion and statin therapy. Patient will benefit from participation in intensive cardiac rehab for nutrition, exercise, and lifestyle modification.              Nutrition Goals Re-Evaluation:  Nutrition Goals Re-Evaluation     Row Name 01/26/23 1624 02/26/23 0956 03/27/23 1017         Goals   Current Weight 307 lb 15.7 oz (139.7 kg) 298 lb 11.6 oz (135.5 kg) 288 lb 12.8 oz (131 kg)     Comment triglycerides 168, HDL 36, LDL  121, A1c 5.8. He continues to see the anti-coagulation clinic regularly BNP 827, other most recent labs triglycerides 168, HDL 36, LDL 121, A1c 5.8. most recent labs triglycerides 168, HDL 36, LDL 121, A1c 5.8, BNP 827     Expected Outcome [name] reports that he has started making some dietary changes including increased fish intake and decreased fast food. He is motivated to lose weight and improve eating habits.Patient will benefit from participation in intensive cardiac rehab for nutrition, exercise, and lifestyle modification. Goals in progress. Elijah Birk continues to attend the [facility] education and nutrition series regularly. He has started making many dietary changes including increased dietary fiber, decreased saturated fat intake, and decreased sodium. He is down 12.1# since starting with our program. He continues follow-up with anti-coagulation clinic regularly. 8/26 Cardiology note, "Per review of Dr. Di Kindle last note, he cannot tolerate diltiazem or beta blockers and has declined cardioversion in the past". Patient will benefit from participation in intensive cardiac rehab for nutrition, exercise, and lifestyle modification. Goals in progress. Elijah Birk continues to attend the [facility] education and nutrition series regularly. He has started making many dietary changes including increased dietary fiber, decreased saturated fat intake, and decreased sodium. He is down 22# since starting with our program. He continues follow-up with anti-coagulation clinic regularly. 9/13 Cardiology note, patient declines cardioversion and statin therapy. Patient will benefit from participation in intensive cardiac rehab for nutrition, exercise, and lifestyle modification.              Nutrition Goals Discharge (Final Nutrition Goals Re-Evaluation):  Nutrition Goals Re-Evaluation - 03/27/23 1017       Goals   Current Weight 288 lb 12.8 oz (131 kg)    Comment most recent labs triglycerides 168, HDL 36, LDL 121, A1c  5.8, BNP 827    Expected Outcome Goals in progress. Elijah Birk continues to attend the [facility] education and nutrition series regularly. He has started making many dietary changes including increased dietary fiber, decreased saturated fat intake, and decreased sodium. He is down 22# since starting with our program. He continues follow-up with anti-coagulation clinic regularly. 9/13 Cardiology note, patient declines cardioversion and statin therapy. Patient will benefit from participation in intensive cardiac rehab for nutrition, exercise, and lifestyle modification.             Psychosocial: Target Goals: Acknowledge presence or absence of significant depression and/or stress, maximize coping skills, provide positive support system. Participant is able to verbalize types and ability to use techniques and skills needed for reducing stress and depression.  Initial Review & Psychosocial Screening:  Initial Psych Review & Screening - 01/22/23 1205       Initial Review   Current issues with None Identified  Family Dynamics   Good Support System? Yes   Has spouse and sons, family for support     Barriers   Psychosocial barriers to participate in program The patient should benefit from training in stress management and relaxation.      Screening Interventions   Interventions Encouraged to exercise             Quality of Life Scores:  Quality of Life - 04/08/23 1500       Quality of Life Scores   Health/Function Pre 11.17 %    Health/Function Post 20.57 %    Health/Function % Change 84.15 %    Socioeconomic Pre 23 %    Socioeconomic Post 25.79 %    Socioeconomic % Change  12.13 %    Psych/Spiritual Pre 21.57 %    Psych/Spiritual Post 26.57 %    Psych/Spiritual % Change 23.18 %    Family Pre 21.6 %    Family Post 26.4 %    Family % Change 22.22 %    GLOBAL Pre 17.11 %    GLOBAL Post 23.24 %    GLOBAL % Change 35.83 %            Scores of 19 and below usually indicate  a poorer quality of life in these areas.  A difference of  2-3 points is a clinically meaningful difference.  A difference of 2-3 points in the total score of the Quality of Life Index has been associated with significant improvement in overall quality of life, self-image, physical symptoms, and general health in studies assessing change in quality of life.  PHQ-9: Review Flowsheet  More data exists      01/27/2023 01/22/2023 10/16/2022 01/20/2022 08/08/2021  Depression screen PHQ 2/9  Decreased Interest 0 0 0 0 0  Down, Depressed, Hopeless 0 0 0 0 0  PHQ - 2 Score 0 0 0 0 0  Altered sleeping 0 0 - - -  Tired, decreased energy 3 3 - - -  Change in appetite 0 0 - - -  Feeling bad or failure about yourself  1 1 - - -  Trouble concentrating 1 1 - - -  Moving slowly or fidgety/restless 0 0 - - -  Suicidal thoughts 0 0 - - -  PHQ-9 Score 5 5 - - -  Difficult doing work/chores Extremely dIfficult Extremely dIfficult - - -    Details           Interpretation of Total Score  Total Score Depression Severity:  1-4 = Minimal depression, 5-9 = Mild depression, 10-14 = Moderate depression, 15-19 = Moderately severe depression, 20-27 = Severe depression   Psychosocial Evaluation and Intervention:   Psychosocial Re-Evaluation:  Psychosocial Re-Evaluation     Row Name 02/17/23 1538 03/17/23 1411 04/14/23 1319         Psychosocial Re-Evaluation   Current issues with None Identified None Identified None Identified     Comments -- -- Elijah Birk will complete cardiac rehab on 04/17/23. Elijah Birk says participating in cardiac rehab has been helpful for him.     Interventions Encouraged to attend Cardiac Rehabilitation for the exercise Encouraged to attend Cardiac Rehabilitation for the exercise Encouraged to attend Cardiac Rehabilitation for the exercise     Continue Psychosocial Services  No Follow up required No Follow up required No Follow up required              Psychosocial Discharge (Final  Psychosocial Re-Evaluation):  Psychosocial Re-Evaluation - 04/14/23  1319       Psychosocial Re-Evaluation   Current issues with None Identified    Comments Elijah Birk will complete cardiac rehab on 04/17/23. Elijah Birk says participating in cardiac rehab has been helpful for him.    Interventions Encouraged to attend Cardiac Rehabilitation for the exercise    Continue Psychosocial Services  No Follow up required             Vocational Rehabilitation: Provide vocational rehab assistance to qualifying candidates.   Vocational Rehab Evaluation & Intervention:  Vocational Rehab - 01/22/23 1209       Initial Vocational Rehab Evaluation & Intervention   Assessment shows need for Vocational Rehabilitation No   Pt is retired            Education: Education Goals: Education classes will be provided on a weekly basis, covering required topics. Participant will state understanding/return demonstration of topics presented.    Education     Row Name 01/26/23 1300     Education   Cardiac Education Topics Pritikin   Glass blower/designer Nutrition   Nutrition Workshop Fueling a Forensic psychologist   Instruction Review Code 1- Tax inspector   Class Start Time 1145   Class Stop Time 1234   Class Time Calculation (min) 49 min    Row Name 01/28/23 1400     Education   Cardiac Education Topics Pritikin   Customer service manager   Weekly Topic Simple Sides and Sauces   Instruction Review Code 1- Verbalizes Understanding   Class Start Time 1145   Class Stop Time 1226   Class Time Calculation (min) 41 min    Row Name 01/30/23 1200     Education   Cardiac Education Topics Pritikin   Nurse, children's Exercise Physiologist   Select Psychosocial   Psychosocial How Our Thoughts Can Heal Our Hearts   Instruction Review Code 1- Verbalizes Understanding   Class Start Time 1150    Class Stop Time 1230   Class Time Calculation (min) 40 min    Row Name 02/02/23 1300     Education   Cardiac Education Topics Pritikin   Select Workshops     Workshops   Educator Exercise Physiologist   Select Exercise   Exercise Workshop Managing Heart Disease: Your Path to a Healthier Heart   Instruction Review Code 1- Verbalizes Understanding   Class Start Time 1147   Class Stop Time 1256   Class Time Calculation (min) 69 min    Row Name 02/04/23 1500     Education   Cardiac Education Topics Pritikin   Orthoptist   Educator Dietitian   Weekly Topic Powerhouse Plant-Based Proteins   Instruction Review Code 1- Verbalizes Understanding   Class Start Time 1145   Class Stop Time 1228   Class Time Calculation (min) 43 min    Row Name 02/06/23 1200     Education   Cardiac Education Topics Pritikin   Hospital doctor Education   General Education Hypertension and Heart Disease   Instruction Review Code 1- Verbalizes Understanding   Class Start Time 1200   Class Stop Time 1239   Class Time Calculation (min) 39 min  Row Name 02/09/23 1200     Education   Cardiac Education Topics Pritikin   Geographical information systems officer Psychosocial   Psychosocial Workshop From Head to Heart: The Power of a Healthy Outlook   Instruction Review Code 1- Verbalizes Understanding   Class Start Time 1155   Class Stop Time 1245   Class Time Calculation (min) 50 min    Row Name 02/11/23 1300     Education   Cardiac Education Topics Pritikin   Customer service manager   Weekly Topic Adding Flavor - Sodium-Free   Instruction Review Code 1- Verbalizes Understanding   Class Start Time 1140   Class Stop Time 1215   Class Time Calculation (min) 35 min    Row Name 02/18/23 1400     Education    Cardiac Education Topics Pritikin   Customer service manager   Weekly Topic Fast and Healthy Breakfasts   Instruction Review Code 1- Verbalizes Understanding   Class Start Time 1140   Class Stop Time 1225   Class Time Calculation (min) 45 min    Row Name 02/18/23 1500     Education   Cardiac Education Topics Pritikin   Customer service manager   Weekly Topic Fast and Healthy Breakfasts   Instruction Review Code 1- Verbalizes Understanding   Class Start Time 1400   Class Stop Time 1445   Class Time Calculation (min) 45 min    Row Name 02/20/23 1300     Education   Cardiac Education Topics Pritikin   Writer Psychosocial   Psychosocial Healthy Minds, Bodies, Hearts   Instruction Review Code 1- Verbalizes Understanding   Class Start Time 1145   Class Stop Time 1222   Class Time Calculation (min) 37 min    Row Name 02/25/23 1400     Education   Cardiac Education Topics Pritikin   Customer service manager   Weekly Topic Personalizing Your Pritikin Plate   Instruction Review Code 1- Verbalizes Understanding   Class Start Time 1150   Class Stop Time 1225   Class Time Calculation (min) 35 min    Row Name 02/27/23 1300     Education   Cardiac Education Topics Pritikin   Select Workshops     Workshops   Educator Exercise Physiologist   Select Exercise   Exercise Workshop Location manager and Fall Prevention   Instruction Review Code 1- Verbalizes Understanding   Class Start Time 1151   Class Stop Time 1238   Class Time Calculation (min) 47 min    Row Name 03/02/23 1300     Education   Cardiac Education Topics Pritikin   Glass blower/designer Nutrition   Nutrition Workshop Label Reading   Instruction Review Code 1- Tax inspector   Class  Start Time 1145   Class Stop Time 1234   Class Time Calculation (min) 49 min    Row Name 03/04/23 1400     Education   Cardiac Education Topics Pritikin   Customer service manager  Weekly Topic Delicious Desserts   Instruction Review Code 1- Verbalizes Understanding   Class Start Time 1145   Class Stop Time 1230   Class Time Calculation (min) 45 min    Row Name 03/06/23 1300     Education   Cardiac Education Topics Pritikin   Select Core Videos     Core Videos   Educator Dietitian   Select Nutrition   Nutrition Other  Label Reading   Instruction Review Code 1- Verbalizes Understanding   Class Start Time 1150   Class Stop Time 1235   Class Time Calculation (min) 45 min    Row Name 03/09/23 1500     Education   Cardiac Education Topics Pritikin   Select Workshops     Workshops   Educator Exercise Physiologist   Select Psychosocial   Psychosocial Workshop Recognizing and Reducing Stress   Instruction Review Code 1- Verbalizes Understanding   Class Start Time 1148   Class Stop Time 1237   Class Time Calculation (min) 49 min    Row Name 03/11/23 1400     Education   Cardiac Education Topics Pritikin   Customer service manager   Weekly Topic Tasty Appetizers and Snacks   Instruction Review Code 1- Verbalizes Understanding   Class Start Time 1145   Class Stop Time 1225   Class Time Calculation (min) 40 min    Row Name 03/13/23 1200     Education   Cardiac Education Topics Pritikin   Nurse, children's Exercise Physiologist   Select Nutrition   Nutrition Calorie Density   Instruction Review Code 1- Verbalizes Understanding   Class Start Time 1155   Class Stop Time 1235   Class Time Calculation (min) 40 min    Row Name 03/16/23 1300     Education   Cardiac Education Topics Pritikin   [employer]     Workshops   Educator Exercise  Physiologist   Select Exercise   Exercise Workshop Exercise Basics: Building Your Action Plan   Instruction Review Code 1- Verbalizes Understanding   Class Start Time 1155   Class Stop Time 1237   Class Time Calculation (min) 42 min    Row Name 03/18/23 1500     Education   Cardiac Education Topics Pritikin   Customer service manager   Weekly Topic Efficiency Cooking - Meals in a Snap   Instruction Review Code 1- Verbalizes Understanding   Class Start Time 1145   Class Stop Time 1224   Class Time Calculation (min) 39 min    Row Name 03/20/23 1400     Education   Cardiac Education Topics Pritikin   Psychologist, forensic Exercise Education   Exercise Education Move It!   Instruction Review Code 1- Verbalizes Understanding   Class Start Time 1145   Class Stop Time 1220   Class Time Calculation (min) 35 min    Row Name 03/23/23 1300     Education   Cardiac Education Topics Pritikin   Glass blower/designer Nutrition   Nutrition Workshop Targeting Your Nutrition Priorities   Instruction Review Code 1- Verbalizes Understanding   Class Start Time 1145   Class Stop Time 1225  Class Time Calculation (min) 40 min    Row Name 03/25/23 1200     Education   Cardiac Education Topics Pritikin   Customer service manager   Weekly Topic One-Pot Wonders   Instruction Review Code 1- Verbalizes Understanding   Class Start Time 1145   Class Stop Time 1225   Class Time Calculation (min) 40 min    Row Name 03/27/23 1200     Education   Cardiac Education Topics Pritikin   Hospital doctor Education   General Education Hypertension and Heart Disease   Instruction Review Code 1- Verbalizes Understanding   Class Start Time 1157   Class  Stop Time 1240   Class Time Calculation (min) 43 min    Row Name 03/30/23 1300     Education   Cardiac Education Topics Pritikin   Licensed conveyancer Nutrition   Nutrition Dining Out - Part 1   Instruction Review Code 1- Verbalizes Understanding   Class Start Time 1145   Class Stop Time 1230   Class Time Calculation (min) 45 min    Row Name 04/01/23 1500     Education   Cardiac Education Topics Pritikin   Customer service manager   Weekly Topic Comforting Weekend Breakfasts   Instruction Review Code 1- Verbalizes Understanding   Class Start Time 1145   Class Stop Time 1223   Class Time Calculation (min) 38 min    Row Name 04/06/23 1200     Education   Cardiac Education Topics Pritikin   Psychologist, forensic Exercise Education   Exercise Education Biomechanial Limitations   Instruction Review Code 1- Verbalizes Understanding   Class Start Time 1155   Class Stop Time 1235   Class Time Calculation (min) 40 min    Row Name 04/08/23 1500     Education   Cardiac Education Topics Pritikin   Customer service manager   Weekly Topic Fast Evening Meals   Instruction Review Code 1- Verbalizes Understanding   Class Start Time 1145   Class Stop Time 1230   Class Time Calculation (min) 45 min    Row Name 04/10/23 1400     Education   Cardiac Education Topics Pritikin   Licensed conveyancer Nutrition   Nutrition Vitamins and Minerals   Instruction Review Code 1- Verbalizes Understanding   Class Start Time 1145   Class Stop Time 1226   Class Time Calculation (min) 41 min    Row Name 04/13/23 1300     Education   Cardiac Education Topics Pritikin   Glass blower/designer Nutrition   Nutrition  Workshop Fueling a Forensic psychologist   Instruction Review Code 1- Teaching laboratory technician Start Time 1145   Class Stop Time 1230   Class Time Calculation (min) 45 min            Core Videos: Exercise    Move It!  Clinical staff conducted group or individual  video education with verbal and written material and guidebook.  Patient learns the recommended Pritikin exercise program. Exercise with the goal of living a long, healthy life. Some of the health benefits of exercise include controlled diabetes, healthier blood pressure levels, improved cholesterol levels, improved heart and lung capacity, improved sleep, and better body composition. Everyone should speak with their doctor before starting or changing an exercise routine.  Biomechanical Limitations Clinical staff conducted group or individual video education with verbal and written material and guidebook.  Patient learns how biomechanical limitations can impact exercise and how we can mitigate and possibly overcome limitations to have an impactful and balanced exercise routine.  Body Composition Clinical staff conducted group or individual video education with verbal and written material and guidebook.  Patient learns that body composition (ratio of muscle mass to fat mass) is a key component to assessing overall fitness, rather than body weight alone. Increased fat mass, especially visceral belly fat, can put [place] at increased risk for metabolic syndrome, type 2 diabetes, heart disease, and even death. It is recommended to combine diet and exercise (cardiovascular and resistance training) to improve your body composition. Seek guidance from your physician and exercise physiologist before implementing an exercise routine.  Exercise Action Plan Clinical staff conducted group or individual video education with verbal and written material and guidebook.  Patient learns the recommended strategies to achieve and enjoy long-term exercise  adherence, including variety, self-motivation, self-efficacy, and positive decision making. Benefits of exercise include fitness, good health, weight management, more energy, better sleep, less stress, and overall well-being.  Medical   Heart Disease Risk Reduction Clinical staff conducted group or individual video education with verbal and written material and guidebook.  Patient learns our heart is our most vital organ as it circulates oxygen, nutrients, white blood cells, and hormones throughout the entire body, and carries waste away. Data supports a plant-based eating plan like the Pritikin Program for its effectiveness in slowing progression of and reversing heart disease. The video provides a number of recommendations to address heart disease.   Metabolic Syndrome and Belly Fat  Clinical staff conducted group or individual video education with verbal and written material and guidebook.  Patient learns what metabolic syndrome is, how it leads to heart disease, and how one can reverse it and keep it from coming back. You have metabolic syndrome if you have 3 of the following 5 criteria: abdominal obesity, high blood pressure, high triglycerides, low HDL cholesterol, and high blood sugar.  Hypertension and Heart Disease Clinical staff conducted group or individual video education with verbal and written material and guidebook.  Patient learns that high blood pressure, or hypertension, is very common in the [place]. Hypertension is largely due to excessive salt intake, but other important risk factors include being overweight, physical inactivity, drinking too much alcohol, smoking, and not eating enough potassium from fruits and vegetables. High blood pressure is a leading risk factor for heart attack, stroke, congestive heart failure, dementia, kidney failure, and premature death. Long-term effects of excessive salt intake include stiffening of the arteries and thickening of heart muscle and  organ damage. Recommendations include ways to reduce hypertension and the risk of heart disease.  Diseases of Our Time - Focusing on Diabetes Clinical staff conducted group or individual video education with verbal and written material and guidebook.  Patient learns why the best way to stop diseases of our time is prevention, through food and other lifestyle changes. Medicine (such as prescription pills and surgeries)  is often only a Band-Aid on the problem, not a long-term solution. Most common diseases of our time include obesity, type 2 diabetes, hypertension, heart disease, and cancer. The Pritikin Program is recommended and has been proven to help reduce, reverse, and/or prevent the damaging effects of metabolic syndrome.  Nutrition   Overview of the Pritikin Eating Plan  Clinical staff conducted group or individual video education with verbal and written material and guidebook.  Patient learns about the Pritikin Eating Plan for disease risk reduction. The Pritikin Eating Plan emphasizes a wide variety of unrefined, minimally-processed carbohydrates, like fruits, vegetables, whole grains, and legumes. Go, Caution, and Stop food choices are explained. Plant-based and lean animal proteins are emphasized. Rationale provided for low sodium intake for blood pressure control, low added sugars for blood sugar stabilization, and low added fats and oils for coronary artery disease risk reduction and weight management.  Calorie Density  Clinical staff conducted group or individual video education with verbal and written material and guidebook.  Patient learns about calorie density and how it impacts the Pritikin Eating Plan. Knowing the characteristics of the food you choose will help you decide whether those foods will lead to weight gain or weight loss, and whether you want to consume more or less of them. Weight loss is usually a side effect of the Pritikin Eating Plan because of its focus on low  calorie-dense foods.  Label Reading  Clinical staff conducted group or individual video education with verbal and written material and guidebook.  Patient learns about the Pritikin recommended label reading guidelines and corresponding recommendations regarding calorie density, added sugars, sodium content, and whole grains.  Dining Out - Part 1  Clinical staff conducted group or individual video education with verbal and written material and guidebook.  Patient learns that restaurant meals can be sabotaging because they can be so high in calories, fat, sodium, and/or sugar. Patient learns recommended strategies on how to positively address this and avoid unhealthy pitfalls.  Facts on Fats  Clinical staff conducted group or individual video education with verbal and written material and guidebook.  Patient learns that lifestyle modifications can be just as effective, if not more so, as many medications for lowering your risk of heart disease. A Pritikin lifestyle can help to reduce your risk of inflammation and atherosclerosis (cholesterol build-up, or plaque, in the artery walls). Lifestyle interventions such as dietary choices and physical activity address the cause of atherosclerosis. A review of the types of fats and their impact on blood cholesterol levels, along with dietary recommendations to reduce fat intake is also included.  Nutrition Action Plan  Clinical staff conducted group or individual video education with verbal and written material and guidebook.  Patient learns how to incorporate Pritikin recommendations into their lifestyle. Recommendations include planning and keeping personal health goals in mind as an important part of their success.  Healthy Mind-Set    Healthy Minds, Bodies, Hearts  Clinical staff conducted group or individual video education with verbal and written material and guidebook.  Patient learns how to identify when they are stressed. Video will discuss the  impact of that stress, as well as the many benefits of stress management. Patient will also be introduced to stress management techniques. The way we think, act, and feel has an impact on our hearts.  How Our Thoughts Can Heal Our Hearts  Clinical staff conducted group or individual video education with verbal and written material and guidebook.  Patient learns that negative thoughts can  cause depression and anxiety. This can result in negative lifestyle behavior and serious health problems. Cognitive behavioral therapy is an effective method to help control our thoughts in order to change and improve our emotional outlook.  Additional Videos:  Exercise    Improving Performance  Clinical staff conducted group or individual video education with verbal and written material and guidebook.  Patient learns to use a non-linear approach by alternating intensity levels and lengths of time spent exercising to help burn more calories and lose more body fat. Cardiovascular exercise helps improve heart health, metabolism, hormonal balance, blood sugar control, and recovery from fatigue. Resistance training improves strength, endurance, balance, coordination, reaction time, metabolism, and muscle mass. Flexibility exercise improves circulation, posture, and balance. Seek guidance from your physician and exercise physiologist before implementing an exercise routine and learn your capabilities and proper form for all exercise.  Introduction to Yoga  Clinical staff conducted group or individual video education with verbal and written material and guidebook.  Patient learns about yoga, a discipline of the coming together of mind, breath, and body. The benefits of yoga include improved flexibility, improved range of motion, better posture and core strength, increased lung function, weight loss, and positive self-image. Yoga's heart health benefits include lowered blood pressure, healthier heart rate, decreased  cholesterol and triglyceride levels, improved immune function, and reduced stress. Seek guidance from your physician and exercise physiologist before implementing an exercise routine and learn your capabilities and proper form for all exercise.  Medical   Aging: Enhancing Your Quality of Life  Clinical staff conducted group or individual video education with verbal and written material and guidebook.  Patient learns key strategies and recommendations to stay in good physical health and enhance quality of life, such as prevention strategies, having an advocate, securing a Health Care Proxy and Power of Attorney, and keeping a list of medications and system for tracking them. It also discusses how to avoid risk for bone loss.  Biology of Weight Control  Clinical staff conducted group or individual video education with verbal and written material and guidebook.  Patient learns that weight gain occurs because we consume more calories than we burn (eating more, moving less). Even if your body weight is normal, you may have higher ratios of fat compared to muscle mass. Too much body fat puts you at increased risk for cardiovascular disease, heart attack, stroke, type 2 diabetes, and obesity-related cancers. In addition to exercise, following the Pritikin Eating Plan can help reduce your risk.  Decoding Lab Results  Clinical staff conducted group or individual video education with verbal and written material and guidebook.  Patient learns that lab test reflects one measurement whose values change over time and are influenced by many factors, including medication, stress, sleep, exercise, food, hydration, pre-existing medical conditions, and more. It is recommended to use the knowledge from this video to become more involved with your lab results and evaluate your numbers to speak with your doctor.   Diseases of Our Time - Overview  Clinical staff conducted group or individual video education with verbal  and written material and guidebook.  Patient learns that according to the CDC, 50% to 70% of chronic diseases (such as obesity, type 2 diabetes, elevated lipids, hypertension, and heart disease) are avoidable through lifestyle improvements including healthier food choices, listening to satiety cues, and increased physical activity.  Sleep Disorders Clinical staff conducted group or individual video education with verbal and written material and guidebook.  Patient learns how good quality  and duration of sleep are important to overall health and well-being. Patient also learns about sleep disorders and how they impact health along with recommendations to address them, including discussing with a physician.  Nutrition  Dining Out - Part 2 Clinical staff conducted group or individual video education with verbal and written material and guidebook.  Patient learns how to plan ahead and communicate in order to maximize their dining experience in a healthy and nutritious manner. Included are recommended food choices based on the type of restaurant the patient is visiting.   Fueling a Banker conducted group or individual video education with verbal and written material and guidebook.  There is a strong connection between our food choices and our health. Diseases like obesity and type 2 diabetes are very prevalent and are in large-part due to lifestyle choices. The Pritikin Eating Plan provides plenty of food and hunger-curbing satisfaction. It is easy to follow, affordable, and helps reduce health risks.  Menu Workshop  Clinical staff conducted group or individual video education with verbal and written material and guidebook.  Patient learns that restaurant meals can sabotage health goals because they are often packed with calories, fat, sodium, and sugar. Recommendations include strategies to plan ahead and to communicate with the manager, chef, or server to help order a healthier  meal.  Planning Your Eating Strategy  Clinical staff conducted group or individual video education with verbal and written material and guidebook.  Patient learns about the Pritikin Eating Plan and its benefit of reducing the risk of disease. The Pritikin Eating Plan does not focus on calories. Instead, it emphasizes high-quality, nutrient-rich foods. By knowing the characteristics of the foods, we choose, we can determine their calorie density and make informed decisions.  Targeting Your Nutrition Priorities  Clinical staff conducted group or individual video education with verbal and written material and guidebook.  Patient learns that lifestyle habits have a tremendous impact on disease risk and progression. This video provides eating and physical activity recommendations based on your personal health goals, such as reducing LDL cholesterol, losing weight, preventing or controlling type 2 diabetes, and reducing high blood pressure.  Vitamins and Minerals  Clinical staff conducted group or individual video education with verbal and written material and guidebook.  Patient learns different ways to obtain key vitamins and minerals, including through a recommended healthy diet. It is important to discuss all supplements you take with your doctor.   Healthy Mind-Set    Smoking Cessation  Clinical staff conducted group or individual video education with verbal and written material and guidebook.  Patient learns that cigarette smoking and tobacco addiction pose a serious health risk which affects millions of people. Stopping smoking will significantly reduce the risk of heart disease, lung disease, and many forms of cancer. Recommended strategies for quitting are covered, including working with your doctor to develop a successful plan.  Culinary   Becoming a Set designer conducted group or individual video education with verbal and written material and guidebook.  Patient learns  that cooking at home can be healthy, cost-effective, quick, and puts them in control. Keys to cooking healthy recipes will include looking at your recipe, assessing your equipment needs, planning ahead, making it simple, choosing cost-effective seasonal ingredients, and limiting the use of added fats, salts, and sugars.  Cooking - Breakfast and Snacks  Clinical staff conducted group or individual video education with verbal and written material and guidebook.  Patient learns how important breakfast  is to satiety and nutrition through the entire day. Recommendations include key foods to eat during breakfast to help stabilize blood sugar levels and to prevent overeating at meals later in the day. Planning ahead is also a key component.  Cooking - Educational psychologist conducted group or individual video education with verbal and written material and guidebook.  Patient learns eating strategies to improve overall health, including an approach to cook more at home. Recommendations include thinking of animal protein as a side on your plate rather than center stage and focusing instead on lower calorie dense options like vegetables, fruits, whole grains, and plant-based proteins, such as beans. Making sauces in large quantities to freeze for later and leaving the skin on your vegetables are also recommended to maximize your experience.  Cooking - Healthy Salads and Dressing Clinical staff conducted group or individual video education with verbal and written material and guidebook.  Patient learns that vegetables, fruits, whole grains, and legumes are the foundations of the Pritikin Eating Plan. Recommendations include how to incorporate each of these in flavorful and healthy salads, and how to create homemade salad dressings. Proper handling of ingredients is also covered. Cooking - Soups and [employer] - Soups and Desserts Clinical staff conducted group or individual video education with  verbal and written material and guidebook.  Patient learns that Pritikin soups and desserts make for easy, nutritious, and delicious snacks and meal components that are low in sodium, fat, sugar, and calorie density, while high in vitamins, minerals, and filling fiber. Recommendations include simple and healthy ideas for soups and desserts.   Overview     The Pritikin Solution Program Overview Clinical staff conducted group or individual video education with verbal and written material and guidebook.  Patient learns that the results of the Pritikin Program have been documented in more than 100 articles published in peer-reviewed journals, and the benefits include reducing risk factors for (and, in some cases, even reversing) high cholesterol, high blood pressure, type 2 diabetes, obesity, and more! An overview of the three key pillars of the Pritikin Program will be covered: eating well, doing regular exercise, and having a healthy mind-set.  WORKSHOPS  Exercise: Exercise Basics: Building Your Action Plan Clinical staff led group instruction and group discussion with PowerPoint presentation and patient guidebook. To enhance the learning environment the use of posters, models and videos may be added. At the conclusion of this workshop, patients will comprehend the difference between physical activity and exercise, as well as the benefits of incorporating both, into their routine. Patients will understand the FITT (Frequency, Intensity, Time, and Type) principle and how to use it to build an exercise action plan. In addition, safety concerns and other considerations for exercise and cardiac rehab will be addressed by the presenter. The purpose of this lesson is to promote a comprehensive and effective weekly exercise routine in order to improve patients' overall level of fitness.   Managing Heart Disease: Your Path to a Healthier Heart Clinical staff led group instruction and group discussion with  PowerPoint presentation and patient guidebook. To enhance the learning environment the use of posters, models and videos may be added.At the conclusion of this workshop, patients will understand the anatomy and physiology of the heart. Additionally, they will understand how Pritikin's three pillars impact the risk factors, the progression, and the management of heart disease.  The purpose of this lesson is to provide a high-level overview of the heart, heart disease, and how  the Pritikin lifestyle positively impacts risk factors.  Exercise Biomechanics Clinical staff led group instruction and group discussion with PowerPoint presentation and patient guidebook. To enhance the learning environment the use of posters, models and videos may be added. Patients will learn how the structural parts of their bodies function and how these functions impact their daily activities, movement, and exercise. Patients will learn how to promote a neutral spine, learn how to manage pain, and identify ways to improve their physical movement in order to promote healthy living. The purpose of this lesson is to expose patients to common physical limitations that impact physical activity. Participants will learn practical ways to adapt and manage aches and pains, and to minimize their effect on regular exercise. Patients will learn how to maintain good posture while sitting, walking, and lifting.  Balance Training and Fall Prevention  Clinical staff led group instruction and group discussion with PowerPoint presentation and patient guidebook. To enhance the learning environment the use of posters, models and videos may be added. At the conclusion of this workshop, patients will understand the importance of their sensorimotor skills (vision, proprioception, and the vestibular system) in maintaining their ability to balance as they age. Patients will apply a variety of balancing exercises that are appropriate for their  current level of function. Patients will understand the common causes for poor balance, possible solutions to these problems, and ways to modify their physical environment in order to minimize their fall risk. The purpose of this lesson is to teach patients about the importance of maintaining balance as they age and ways to minimize their risk of falling.  WORKSHOPS   Nutrition:  Fueling a Ship broker led group instruction and group discussion with PowerPoint presentation and patient guidebook. To enhance the learning environment the use of posters, models and videos may be added. Patients will review the foundational principles of the Pritikin Eating Plan and understand what constitutes a serving size in each of the food groups. Patients will also learn Pritikin-friendly foods that are better choices when away from home and review make-ahead meal and snack options. Calorie density will be reviewed and applied to three nutrition priorities: weight maintenance, weight loss, and weight gain. The purpose of this lesson is to reinforce (in a group setting) the key concepts around what patients are recommended to eat and how to apply these guidelines when away from home by planning and selecting Pritikin-friendly options. Patients will understand how calorie density may be adjusted for different weight management goals.  Mindful Eating  Clinical staff led group instruction and group discussion with PowerPoint presentation and patient guidebook. To enhance the learning environment the use of posters, models and videos may be added. Patients will briefly review the concepts of the Pritikin Eating Plan and the importance of low-calorie dense foods. The concept of mindful eating will be introduced as well as the importance of paying attention to internal hunger signals. Triggers for non-hunger eating and techniques for dealing with triggers will be explored. The purpose of this lesson is to provide  patients with the opportunity to review the basic principles of the Pritikin Eating Plan, discuss the value of eating mindfully and how to measure internal cues of hunger and fullness using the Hunger Scale. Patients will also discuss reasons for non-hunger eating and learn strategies to use for controlling emotional eating.  Targeting Your Nutrition Priorities Clinical staff led group instruction and group discussion with PowerPoint presentation and patient guidebook. To enhance the learning environment the  use of posters, models and videos may be added. Patients will learn how to determine their genetic susceptibility to disease by reviewing their family history. Patients will gain insight into the importance of diet as part of an overall healthy lifestyle in mitigating the impact of genetics and other environmental insults. The purpose of this lesson is to provide patients with the opportunity to assess their personal nutrition priorities by looking at their family history, their own health history and current risk factors. Patients will also be able to discuss ways of prioritizing and modifying the Pritikin Eating Plan for their highest risk areas  Menu  Clinical staff led group instruction and group discussion with PowerPoint presentation and patient guidebook. To enhance the learning environment the use of posters, models and videos may be added. Using menus brought in from [employer], or printed from [employer], patients will apply the Pritikin dining out guidelines that were presented in the [employer] video. Patients will also be able to practice these guidelines in a variety of provided scenarios. The purpose of this lesson is to provide patients with the opportunity to practice hands-on learning of the Pritikin Dining Out guidelines with actual menus and practice scenarios.  Label Reading Clinical staff led group instruction and group discussion with PowerPoint  presentation and patient guidebook. To enhance the learning environment the use of posters, models and videos may be added. Patients will review and discuss the Pritikin label reading guidelines presented in Pritikin's Label Reading Educational series video. Using fool labels brought in from local grocery stores and markets, patients will apply the label reading guidelines and determine if the packaged food meet the Pritikin guidelines. The purpose of this lesson is to provide patients with the opportunity to review, discuss, and practice hands-on learning of the Pritikin Label Reading guidelines with actual packaged food labels. Cooking School  Pritikin's [employer] are designed to teach patients ways to prepare quick, simple, and affordable recipes at home. The importance of nutrition's role in chronic disease risk reduction is reflected in its emphasis in the overall Pritikin program. By learning how to prepare essential core Pritikin Eating Plan recipes, patients will increase control over what they eat; be able to customize the flavor of foods without the use of added salt, sugar, or fat; and improve the quality of the food they consume. By learning a set of core recipes which are easily assembled, quickly prepared, and affordable, patients are more likely to prepare more healthy foods at home. These workshops focus on convenient breakfasts, simple entres, side dishes, and desserts which can be prepared with minimal effort and are consistent with nutrition recommendations for cardiovascular risk reduction. Cooking [employer] are taught by a Armed forces logistics/support/administrative officer (RD) who has been trained by the [employer]. The chef or RD has a clear understanding of the importance of minimizing - if not completely eliminating - added fat, sugar, and sodium in recipes. Throughout the series of Cooking School Workshop sessions, patients will learn about healthy ingredients and  efficient methods of cooking to build confidence in their capability to prepare    Cooking School weekly topics:  Adding Flavor- Sodium-Free  Fast and Healthy Breakfasts  Powerhouse Plant-Based Proteins  Satisfying Salads and Dressings  Simple Sides and Sauces  International Cuisine-Spotlight on the [employer] Zones  Delicious Desserts  Savory Soups  [employer] - Meals in a Astronomer Appetizers and Snacks  Comforting Weekend Breakfasts  One-Pot Wonders  Fast Evening Meals  Easy Entertaining  Personalizing Your Pritikin Plate  WORKSHOPS   Healthy Mindset (Psychosocial):  Focused Goals, Sustainable Changes Clinical staff led group instruction and group discussion with PowerPoint presentation and patient guidebook. To enhance the learning environment the use of posters, models and videos may be added. Patients will be able to apply effective goal setting strategies to establish at least one personal goal, and then take consistent, meaningful action toward that goal. They will learn to identify common barriers to achieving personal goals and develop strategies to overcome them. Patients will also gain an understanding of how our mind-set can impact our ability to achieve goals and the importance of cultivating a positive and growth-oriented mind-set. The purpose of this lesson is to provide patients with a deeper understanding of how to set and achieve personal goals, as well as the tools and strategies needed to overcome common obstacles which may arise along the way.  From Head to Heart: The Power of a Healthy Outlook  Clinical staff led group instruction and group discussion with PowerPoint presentation and patient guidebook. To enhance the learning environment the use of posters, models and videos may be added. Patients will be able to recognize and describe the impact of emotions and mood on physical health. They will discover the importance of self-care and explore self-care  practices which may work for them. Patients will also learn how to utilize the 4 C's to cultivate a healthier outlook and better manage stress and challenges. The purpose of this lesson is to demonstrate to patients how a healthy outlook is an essential part of maintaining good health, especially as they continue their cardiac rehab journey.  Healthy Sleep for a Healthy Heart Clinical staff led group instruction and group discussion with PowerPoint presentation and patient guidebook. To enhance the learning environment the use of posters, models and videos may be added. At the conclusion of this workshop, patients will be able to demonstrate knowledge of the importance of sleep to overall health, well-being, and quality of life. They will understand the symptoms of, and treatments for, common sleep disorders. Patients will also be able to identify daytime and nighttime behaviors which impact sleep, and they will be able to apply these tools to help manage sleep-related challenges. The purpose of this lesson is to provide patients with a general overview of sleep and outline the importance of quality sleep. Patients will learn about a few of the most common sleep disorders. Patients will also be introduced to the concept of "sleep hygiene," and discover ways to self-manage certain sleeping problems through simple daily behavior changes. Finally, the workshop will motivate patients by clarifying the links between quality sleep and their goals of heart-healthy living.   Recognizing and Reducing Stress Clinical staff led group instruction and group discussion with PowerPoint presentation and patient guidebook. To enhance the learning environment the use of posters, models and videos may be added. At the conclusion of this workshop, patients will be able to understand the types of stress reactions, differentiate between acute and chronic stress, and recognize the impact that chronic stress has on their health. They  will also be able to apply different coping mechanisms, such as reframing negative self-talk. Patients will have the opportunity to practice a variety of stress management techniques, such as deep abdominal breathing, progressive muscle relaxation, and/or guided imagery.  The purpose of this lesson is to educate patients on the role of stress in their lives and to provide healthy techniques for coping  with it.  Learning Barriers/Preferences:  Learning Barriers/Preferences - 01/22/23 1238       Learning Barriers/Preferences   Learning Barriers Sight    Learning Preferences Audio;Verbal Instruction;Video;Computer/Internet;Written Material;Group Instruction;Individual Instruction;Pictoral;Skilled Demonstration             Education Topics:  Knowledge Questionnaire Score:  Knowledge Questionnaire Score - 04/09/23 1432       Knowledge Questionnaire Score   Post Score 24/24             Core Components/Risk Factors/Patient Goals at Admission:  Personal Goals and Risk Factors at Admission - 01/22/23 1210       Core Components/Risk Factors/Patient Goals on Admission    Weight Management Yes;Obesity;Weight Loss    Intervention Weight Management: Develop a combined nutrition and exercise program designed to reach desired caloric intake, while maintaining appropriate intake of nutrient and fiber, sodium and fats, and appropriate energy expenditure required for the weight goal.;Weight Management: Provide education and appropriate resources to help participant work on and attain dietary goals.;Weight Management/Obesity: Establish reasonable short term and long term weight goals.;Obesity: Provide education and appropriate resources to help participant work on and attain dietary goals.    Admit Weight 310 lb 13.6 oz (141 kg)    Expected Outcomes Short Term: Continue to assess and modify interventions until short term weight is achieved;Long Term: Adherence to nutrition and physical  activity/exercise program aimed toward attainment of established weight goal;Weight Loss: Understanding of general recommendations for a balanced deficit meal plan, which promotes 1-2 lb weight loss per week and includes a negative energy balance of [phone] kcal/d;Understanding recommendations for meals to include 15-35% energy as protein, 25-35% energy from fat, 35-60% energy from carbohydrates, less than 200mg  of dietary cholesterol, 20-35 gm of total fiber daily;Understanding of distribution of calorie intake throughout the day with the consumption of 4-5 meals/snacks    Hypertension Yes    Intervention Provide education on lifestyle modifcations including regular physical activity/exercise, weight management, moderate sodium restriction and increased consumption of fresh fruit, vegetables, and low fat dairy, alcohol moderation, and smoking cessation.;Monitor prescription use compliance.    Expected Outcomes Short Term: Continued assessment and intervention until BP is < 140/45mm HG in hypertensive participants. < 130/2mm HG in hypertensive participants with diabetes, heart failure or chronic kidney disease.;Long Term: Maintenance of blood pressure at goal levels.    Stress Yes    Intervention Offer individual and/or small group education and counseling on adjustment to heart disease, stress management and health-related lifestyle change. Teach and support self-help strategies.;Refer participants experiencing significant psychosocial distress to appropriate mental health specialists for further evaluation and treatment. When possible, include family members and significant others in education/counseling sessions.    Expected Outcomes Short Term: Participant demonstrates changes in health-related behavior, relaxation and other stress management skills, ability to obtain effective social support, and compliance with psychotropic medications if prescribed.;Long Term: Emotional wellbeing is indicated by  absence of clinically significant psychosocial distress or social isolation.             Core Components/Risk Factors/Patient Goals Review:   Goals and Risk Factor Review     Row Name 02/17/23 1538 03/17/23 1413 04/14/23 1321         Core Components/Risk Factors/Patient Goals Review   Personal Goals Review Weight Management/Obesity;Hypertension;Lipids Weight Management/Obesity;Hypertension;Lipids Weight Management/Obesity;Hypertension;Lipids     Review Elijah Birk is doing well with exercise at cardiac rehab. Heart rate variable due to AFib. Elijah Birk has been increasing his worloads and has lost 4.4. kg since  starting cardiac rehab Elijah Birk is doing well with exercise at cardiac rehab. Resting heart rate has been in the 90's to low 100's.  Elijah Birk has been increasing his workloads and has lost 8.0 kg since starting cardiac rehab. Elijah Birk is doing well with exercise and weight loss. Elijah Birk is doing well with exercise at cardiac rehab. Resting heart rate has been in the 90's to low 100's.  Elijah Birk has been increasing his workloads and has lost 14.0 kg since starting cardiac rehab. Elijah Birk is doing well with exercise and weight loss. Elijah Birk will complete cardiac rehab on 04/17/23     Expected Outcomes [name] will continue to participate in cardiac rehab for exercise, nutrition , lifestyle modifications. Elijah Birk will continue to participate in cardiac rehab for exercise, nutrition , lifestyle modifications. Elijah Birk will continue to participate in cardiac rehab for exercise, nutrition , lifestyle modifications.              Core Components/Risk Factors/Patient Goals at Discharge (Final Review):   Goals and Risk Factor Review - 04/14/23 1321       Core Components/Risk Factors/Patient Goals Review   Personal Goals Review Weight Management/Obesity;Hypertension;Lipids    Review Elijah Birk is doing well with exercise at cardiac rehab. Resting heart rate has been in the 90's to low 100's.  Elijah Birk has been increasing his workloads and has lost 14.0 kg  since starting cardiac rehab. Elijah Birk is doing well with exercise and weight loss. Elijah Birk will complete cardiac rehab on 04/17/23    Expected Outcomes [name] will continue to participate in cardiac rehab for exercise, nutrition , lifestyle modifications.             ITP Comments:  ITP Comments     Row Name 01/22/23 0930 02/17/23 1537 03/17/23 1410 04/14/23 1318     ITP Comments Armanda Magic, MD: Medical Director.  Patient introduced to the [facility]/Intensive Cardiac Rehab.  Initial orientation packet reviewed with the patient today. Elijah Birk has good attendance and participation in cardiac rehab/ 30 Day ITP review. Elijah Birk has good attendance and participation in cardiac rehab 30 Day ITP review. Elijah Birk has good attendance and participation in cardiac rehab. Elijah Birk will complete cardiac rehab on 04/17/23.             Comments: See ITP comments.

## 2023-04-15 ENCOUNTER — Encounter (HOSPITAL_COMMUNITY)
Admission: RE | Admit: 2023-04-15 | Discharge: 2023-04-15 | Disposition: A | Discharge status: Home / Self Care | Payer: MEDICARE | Source: Ambulatory Visit | Attending: Cardiology | Admitting: Cardiology

## 2023-04-15 DIAGNOSIS — Z952 Presence of prosthetic heart valve: Secondary | ICD-10-CM

## 2023-04-15 DIAGNOSIS — Z48812 Encounter for surgical aftercare following surgery on the circulatory system: Secondary | ICD-10-CM | POA: Diagnosis not present

## 2023-04-15 NOTE — Progress Notes (Signed)
Discharge Progress Report  Patient Details  Name: Robert [name] MRN: [id number] Date of Birth: Oct 18, 1950 Referring Provider:   Flowsheet Row INTENSIVE CARDIAC REHAB ORIENT from 01/22/2023 in [facility]  Referring Provider Jodelle Red, MD        Number of Visits: 62  Reason for Discharge:  Patient reached a stable level of exercise. Patient independent in their exercise. Patient has met program and personal goals.  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)  ADL UCSD:   Initial Exercise Prescription:  Initial Exercise Prescription - 01/22/23 1200       Date of Initial Exercise RX and Referring Provider   Date 01/22/23    Referring Provider Jodelle Red, MD    Expected Discharge Date 04/15/23      T5 Nustep   Level 1    SPM 70    Minutes 25    METs 1.7      Prescription Details   Frequency (times per week) 3    Duration Progress to 30 minutes of continuous aerobic without signs/symptoms of physical distress      Intensity   THRR 40-80% of Max Heartrate 60-119    Ratings of Perceived Exertion 11-13    Perceived Dyspnea 0-4      Progression   Progression Continue progressive overload as per policy without signs/symptoms or physical distress.      Resistance Training   Training Prescription Yes    Weight 3 lbs    Reps 10-15             Discharge Exercise Prescription (Final Exercise Prescription Changes):  Exercise Prescription Changes - 04/17/23 1500       Response to Exercise   Blood Pressure (Admit) 108/72    Blood Pressure (Exit) 106/68    Heart Rate (Admit) 90 bpm    Heart Rate (Exercise) 155 bpm    Heart Rate (Exit) 98 bpm    Rating of Perceived Exertion (Exercise) 13    Symptoms None    Comments Pt graduaed from the CRP2 program  today    Duration Continue with 30 min of aerobic exercise without signs/symptoms of physical distress.    Intensity THRR unchanged      Progression   Progression Continue to progress workloads to maintain intensity without signs/symptoms of physical distress.    Average METs 4.5      Resistance Training   Training Prescription Yes    Weight 4 lbs    Reps 10-15    Time 10 Minutes      Interval Training   Interval Training No      T5 Nustep   Level 6    SPM 123    Minutes 30    METs 4.6      Home Exercise Plan   Plans to continue exercise at Home (comment)    Frequency Add 2 additional days to program exercise sessions.    Initial Home Exercises Provided 02/27/23             Functional Capacity:  6 Minute Walk     Row Name 01/22/23 1126 04/10/23 1254       6 Minute Walk   Phase Initial Discharge  Used Go-cart    Distance 480 feet 1092 feet  Used Go-cart    Distance % Change --  127.5 %    Distance Feet Change -- 612 ft    Walk Time 6 minutes 6 minutes    # of Rest Breaks 2  2 breaks totaling 2:07 due to going over THR 0    MPH 1 2.1    METS 1.7 1.7  previous METS of 1.7 was an error, METs were 1.0    RPE 9 11    Perceived Dyspnea  1 1    VO2 Peak 1.03 2.9    Symptoms Yes (comment) Yes (comment)    Comments SOB, RPD = 1 SOB, RPD = 1    Resting HR 81 bpm 86 bpm    Resting BP 112/74 116/70    Resting Oxygen Saturation  95 % --    Exercise Oxygen Saturation  during 6 min walk 94 % --    Max Ex. HR 134 bpm 98 bpm    Max Ex. BP 144/78 140/70    2 Minute Post BP 124/80 --      Interval HR   1 Minute HR 114 --    2 Minute HR 129 --    3 Minute HR 114 --    4 Minute HR 112 --    5 Minute HR 126 --    6 Minute HR 114 --    2 Minute Post HR 87 --    Interval Heart Rate? Yes --      Interval Oxygen   Interval Oxygen? Yes --    Baseline Oxygen Saturation % 95 % --    1 Minute Oxygen Saturation % 97 % --    1 Minute Liters of Oxygen 0 L --    2 Minute  Oxygen Saturation % 94 % --    2 Minute Liters of Oxygen 0 L --    3 Minute Oxygen Saturation % 96 % --    3 Minute Liters of Oxygen 0 L --    4 Minute Oxygen Saturation % 96 % --    4 Minute Liters of Oxygen 0 L --    5 Minute Oxygen Saturation % 95 % --    5 Minute Liters of Oxygen 0 L --    6 Minute Oxygen Saturation % 97 % --    6 Minute Liters of Oxygen 0 L --             Psychological, QOL, Others - Outcomes: PHQ 2/9:    04/20/2023    2:47 PM 04/15/2023    2:10 PM 01/27/2023    3:46 PM 01/22/2023   12:02 PM 10/16/2022    9:39 AM  Depression screen PHQ 2/9  Decreased Interest 0 0 0 0 0  Down, Depressed, Hopeless 1 1 0 0 0  PHQ - 2 Score 1 1 0 0 0  Altered sleeping  1 0 0   Tired, decreased energy  1 3 3    Change in appetite  0 0 0   Feeling bad or failure about yourself   0 1 1   Trouble concentrating  1 1 1    Moving slowly or fidgety/restless  0 0 0   Suicidal thoughts  0 0 0   PHQ-9 Score  4 5 5    Difficult doing work/chores  Not difficult at all Extremely dIfficult Extremely dIfficult     Quality of Life:  Quality of Life - 04/08/23 1500       Quality of Life Scores   Health/Function Pre 11.17 %    Health/Function  Post 20.57 %    Health/Function % Change 84.15 %    Socioeconomic Pre 23 %    Socioeconomic Post 25.79 %    Socioeconomic % Change  12.13 %    Psych/Spiritual Pre 21.57 %    Psych/Spiritual Post 26.57 %    Psych/Spiritual % Change 23.18 %    Family Pre 21.6 %    Family Post 26.4 %    Family % Change 22.22 %    GLOBAL Pre 17.11 %    GLOBAL Post 23.24 %    GLOBAL % Change 35.83 %             Personal Goals: Goals established at orientation with interventions provided to work toward goal.  Personal Goals and Risk Factors at Admission - 01/22/23 1210       Core Components/Risk Factors/Patient Goals on Admission    Weight Management Yes;Obesity;Weight Loss    Intervention Weight Management: Develop a combined nutrition and exercise  program designed to reach desired caloric intake, while maintaining appropriate intake of nutrient and fiber, sodium and fats, and appropriate energy expenditure required for the weight goal.;Weight Management: Provide education and appropriate resources to help participant work on and attain dietary goals.;Weight Management/Obesity: Establish reasonable short term and long term weight goals.;Obesity: Provide education and appropriate resources to help participant work on and attain dietary goals.    Admit Weight 310 lb 13.6 oz (141 kg)    Expected Outcomes Short Term: Continue to assess and modify interventions until short term weight is achieved;Long Term: Adherence to nutrition and physical activity/exercise program aimed toward attainment of established weight goal;Weight Loss: Understanding of general recommendations for a balanced deficit meal plan, which promotes 1-2 lb weight loss per week and includes a negative energy balance of [phone] kcal/d;Understanding recommendations for meals to include 15-35% energy as protein, 25-35% energy from fat, 35-60% energy from carbohydrates, less than 200mg  of dietary cholesterol, 20-35 gm of total fiber daily;Understanding of distribution of calorie intake throughout the day with the consumption of 4-5 meals/snacks    Hypertension Yes    Intervention Provide education on lifestyle modifcations including regular physical activity/exercise, weight management, moderate sodium restriction and increased consumption of fresh fruit, vegetables, and low fat dairy, alcohol moderation, and smoking cessation.;Monitor prescription use compliance.    Expected Outcomes Short Term: Continued assessment and intervention until BP is < 140/14mm HG in hypertensive participants. < 130/48mm HG in hypertensive participants with diabetes, heart failure or chronic kidney disease.;Long Term: Maintenance of blood pressure at goal levels.    Stress Yes    Intervention Offer individual  and/or small group education and counseling on adjustment to heart disease, stress management and health-related lifestyle change. Teach and support self-help strategies.;Refer participants experiencing significant psychosocial distress to appropriate mental health specialists for further evaluation and treatment. When possible, include family members and significant others in education/counseling sessions.    Expected Outcomes Short Term: Participant demonstrates changes in health-related behavior, relaxation and other stress management skills, ability to obtain effective social support, and compliance with psychotropic medications if prescribed.;Long Term: Emotional wellbeing is indicated by absence of clinically significant psychosocial distress or social isolation.              Personal Goals Discharge:  Goals and Risk Factor Review     Row Name 02/17/23 1538 03/17/23 1413 04/14/23 1321         Core Components/Risk Factors/Patient Goals Review   Personal Goals Review Weight Management/Obesity;Hypertension;Lipids Weight Management/Obesity;Hypertension;Lipids Weight Management/Obesity;Hypertension;Lipids  Review Robert [name] is doing well with exercise at cardiac rehab. Heart rate variable due to AFib. Robert [name] has been increasing his worloads and has lost 4.4. kg since starting cardiac rehab Robert [name] is doing well with exercise at cardiac rehab. Resting heart rate has been in the 90's to low 100's.  Robert [name] has been increasing his workloads and has lost 8.0 kg since starting cardiac rehab. Robert [name] is doing well with exercise and weight loss. Robert [name] is doing well with exercise at cardiac rehab. Resting heart rate has been in the 90's to low 100's.  Robert [name] has been increasing his workloads and has lost 14.0 kg since starting cardiac rehab. Robert [name] is doing well with exercise and weight loss. Robert [name] will complete cardiac rehab on 04/17/23     Expected Outcomes Robert [name] will continue to participate in cardiac rehab for exercise, nutrition  , lifestyle modifications. Robert [name] will continue to participate in cardiac rehab for exercise, nutrition , lifestyle modifications. Robert [name] will continue to participate in cardiac rehab for exercise, nutrition , lifestyle modifications.              Exercise Goals and Review:  Exercise Goals     Row Name 01/22/23 1233             Exercise Goals   Increase Physical Activity Yes       Intervention Provide advice, education, support and counseling about physical activity/exercise needs.;Develop an individualized exercise prescription for aerobic and resistive training based on initial evaluation findings, risk stratification, comorbidities and participant's personal goals.       Expected Outcomes Short Term: Attend rehab on a regular basis to increase amount of physical activity.;Long Term: Add in home exercise to make exercise part of routine and to increase amount of physical activity.;Long Term: Exercising regularly at least 3-5 days a week.       Increase Strength and Stamina Yes       Intervention Provide advice, education, support and counseling about physical activity/exercise needs.;Develop an individualized exercise prescription for aerobic and resistive training based on initial evaluation findings, risk stratification, comorbidities and participant's personal goals.       Expected Outcomes Short Term: Increase workloads from initial exercise prescription for resistance, speed, and METs.;Short Term: Perform resistance training exercises routinely during rehab and add in resistance training at home;Long Term: Improve cardiorespiratory fitness, muscular endurance and strength as measured by increased METs and functional capacity ( )       Able to understand and use rate of perceived exertion (RPE) scale Yes       Intervention Provide education and explanation on how to use RPE scale       Expected Outcomes Short Term: Able to use RPE daily in rehab to express subjective intensity level;Long  Term:  Able to use RPE to guide intensity level when exercising independently       Knowledge and understanding of Target Heart Rate Range (THRR) Yes       Intervention Provide education and explanation of THRR including how the numbers were predicted and where they are located for reference       Expected Outcomes Short Term: Able to state/look up THRR;Short Term: Able to use daily as guideline for intensity in rehab;Long Term: Able to use THRR to govern intensity when exercising independently       Understanding of Exercise Prescription Yes       Intervention Provide education, explanation, and written materials on patient's individual exercise prescription       Expected  Outcomes Short Term: Able to explain program exercise prescription;Long Term: Able to explain home exercise prescription to exercise independently                Exercise Goals Re-Evaluation:  Exercise Goals Re-Evaluation     Row Name 01/26/23 1611 02/26/23 1532 03/18/23 1517 04/08/23 1500 04/17/23 1526     Exercise Goal Re-Evaluation   Exercise Goals Review Increase Physical Activity;Able to understand and use Dyspnea scale;Increase Strength and Stamina;Knowledge and understanding of Target Heart Rate Range (THRR);Able to understand and use rate of perceived exertion (RPE) scale Increase Physical Activity;Able to understand and use Dyspnea scale;Increase Strength and Stamina;Knowledge and understanding of Target Heart Rate Range (THRR);Able to understand and use rate of perceived exertion (RPE) scale -- Increase Physical Activity;Able to understand and use Dyspnea scale;Increase Strength and Stamina;Knowledge and understanding of Target Heart Rate Range (THRR);Able to understand and use rate of perceived exertion (RPE) scale Increase Physical Activity;Able to understand and use Dyspnea scale;Increase Strength and Stamina;Knowledge and understanding of Target Heart Rate Range (THRR);Able to understand and use rate of perceived  exertion (RPE) scale   Comments Pt's first day in the CRP2 program. Pt understands the exercise Rx, THRR, and RPE scale. Reviewed METs and goals. Pt voices progress on his goal of increased strength and stamina. Pt's goal to improve walking is a "work in progress". Pt has also had goal of weight loss and has lost 5.5 kg since starting the program. Reviewed METs with patient, Pt is steadily increasing his METs in the program, he averaged 4.3 METs today and increased his level to 4 on the NuStep. Reviewed METs and goals. Peak METs are 4.6.  Pt has imrpovred his strength and stamina which is a goal. Pt has goal of weight loss and has lost 12 kg to date. Pt is trying to get back to walking. Pt graduated from the CRP2 program today.  Pt achieved his goals of increased strength and stamina and getting back to walking again. Pt plans to continue walking at home. Pt was given information on the PREP program as he is a [facility] patient. Pt will consider it after discussing with his wife.   Expected Outcomes Will continue to monitor patient and progress exercise workloads as tolerated. Will continue to monitor patient and progress exercise workloads as tolerated. Will continue to progress workload as tolerated. Will continue to progress workload as tolerated. Pt will continue to exercise a thome.            Nutrition & Weight - Outcomes:  Pre Biometrics - 01/22/23 1000       Pre Biometrics   Waist Circumference 55.25 inches    Hip Circumference 52 inches    Waist to Hip Ratio 1.06 %    Triceps Skinfold 34 mm    % Body Fat 45.3 %    Grip Strength 34 kg    Flexibility --   Not performed due to significant back issues   Single Leg Stand 1.18 seconds              Nutrition:  Nutrition Therapy & Goals - 04/17/23 1430       Nutrition Therapy   Diet Heart Healthy Diet    Drug/Food Interactions Coumadin/Vit K      Personal Nutrition Goals   Nutrition Goal Patient to identify strategies for  reducing cardiovascular risk by attending the [facility] education and nutrition series weekly.   goal met   Personal Goal #2 Patient to  improve diet quality by using the plate method as a guide for meal planning to include lean protein/plant protein, fruits, vegetables, whole grains, nonfat dairy as part of a well-balanced diet.   goal in progress.   Personal Goal #3 Patient to reduce sodium intake to 1500mg  per day   goal in progress.   Personal Goal #4 Patient to identify strategies for weight loss of 0.5-2.0# per week.   goal met.   Comments Goals in action. Robert [name] continues to attend the [facility] education and nutrition series regularly. He does graduate today. He has started making many dietary changes including increased dietary fiber, decreased saturated fat intake, and decreased sodium. He is down 31# since starting with our program. He continues follow-up with anti-coagulation clinic regularly. 9/13 Cardiology note, patient declines cardioversion and statin therapy; most updated lipid panel from 2023. Patient will benefit from adherence to nutrition, exercise, and lifestyle modification recommendations.      Intervention Plan   Intervention Prescribe, educate and counsel regarding individualized specific dietary modifications aiming towards targeted core components such as weight, hypertension, lipid management, diabetes, heart failure and other comorbidities.;Nutrition handout(s) given to patient.    Expected Outcomes Short Term Goal: Understand basic principles of dietary content, such as calories, fat, sodium, cholesterol and nutrients.;Long Term Goal: Adherence to prescribed nutrition plan.             Nutrition Discharge:  Nutrition Assessments - 04/17/23 0835       Rate Your Plate Scores   Pre Score 63    Post Score 80             Education Questionnaire Score:  Knowledge Questionnaire Score - 04/09/23 1432       Knowledge Questionnaire Score   Post Score 24/24              Goals reviewed with patient; copy given to patient. Pt graduates from  Intensive/Traditional cardiac rehab program on 04/15/23  with completion of  34 exercise and 33 education sessions. Pt maintained good attendance and progressed nicely during their participation in rehab as evidenced by increased MET level. Robert [name] made significant improvement as he increased his distance on his post exercise walk test by 1092 feet and lost 14 kg!   Medication list reconciled. Repeat  PHQ score-  4.  Pt has made significant lifestyle changes and should be commended for his success. Robert [name] achieved their goals during cardiac rehab.   Pt plans to continue exercise at home walking with his walking sticks. We are proud of Robert [name]'s progress!Thayer Headings RN BSN

## 2023-04-17 ENCOUNTER — Ambulatory Visit (INDEPENDENT_AMBULATORY_CARE_PROVIDER_SITE_OTHER): Payer: MEDICARE

## 2023-04-17 ENCOUNTER — Encounter (HOSPITAL_COMMUNITY)
Admission: RE | Admit: 2023-04-17 | Discharge: 2023-04-17 | Disposition: A | Discharge status: Home / Self Care | Payer: MEDICARE | Source: Ambulatory Visit | Attending: Cardiology

## 2023-04-17 DIAGNOSIS — Z7901 Long term (current) use of anticoagulants: Secondary | ICD-10-CM | POA: Diagnosis not present

## 2023-04-17 DIAGNOSIS — Z952 Presence of prosthetic heart valve: Secondary | ICD-10-CM

## 2023-04-17 DIAGNOSIS — Z48812 Encounter for surgical aftercare following surgery on the circulatory system: Secondary | ICD-10-CM | POA: Diagnosis not present

## 2023-04-17 LAB — POCT INR: INR: 3.2 — AB (ref 2.0–3.0)

## 2023-04-17 NOTE — Patient Instructions (Addendum)
Pre visit review using our clinic review tool, if applicable. No additional management support is needed unless otherwise documented below in the visit note.  Hold dose today and then continue 1/2 tablet daily except take 1 tablet Fridays.  Recheck in 3 weeks.

## 2023-04-17 NOTE — Progress Notes (Cosign Needed)
Hold dose today and then continue 1/2 tablet daily except take 1 tablet Fridays.  Recheck in 3 weeks.  Medical screening examination/treatment/procedure(s) were performed by non-physician practitioner and as supervising physician I was immediately available for consultation/collaboration.  I agree with above. Jacinta Shoe, MD

## 2023-04-20 ENCOUNTER — Ambulatory Visit (INDEPENDENT_AMBULATORY_CARE_PROVIDER_SITE_OTHER): Payer: MEDICARE | Admitting: Internal Medicine

## 2023-04-20 ENCOUNTER — Encounter: Payer: Self-pay | Admitting: Internal Medicine

## 2023-04-20 VITALS — BP 112/80 | HR 82 | Temp 97.8°F | Ht 68.0 in | Wt 274.1 lb

## 2023-04-20 DIAGNOSIS — I509 Heart failure, unspecified: Secondary | ICD-10-CM | POA: Diagnosis not present

## 2023-04-20 DIAGNOSIS — G8929 Other chronic pain: Secondary | ICD-10-CM

## 2023-04-20 DIAGNOSIS — E538 Deficiency of other specified B group vitamins: Secondary | ICD-10-CM | POA: Diagnosis not present

## 2023-04-20 DIAGNOSIS — E66811 Obesity, class 1: Secondary | ICD-10-CM | POA: Diagnosis not present

## 2023-04-20 DIAGNOSIS — Z86718 Personal history of other venous thrombosis and embolism: Secondary | ICD-10-CM

## 2023-04-20 DIAGNOSIS — Z952 Presence of prosthetic heart valve: Secondary | ICD-10-CM | POA: Diagnosis not present

## 2023-04-20 DIAGNOSIS — I825Y3 Chronic embolism and thrombosis of unspecified deep veins of proximal lower extremity, bilateral: Secondary | ICD-10-CM

## 2023-04-20 DIAGNOSIS — M545 Low back pain, unspecified: Secondary | ICD-10-CM

## 2023-04-20 LAB — CBC WITH DIFFERENTIAL/PLATELET
Basophils Absolute: 0.1 10*3/uL (ref 0.0–0.1)
Basophils Relative: 0.7 % (ref 0.0–3.0)
Eosinophils Absolute: 0.2 10*3/uL (ref 0.0–0.7)
Eosinophils Relative: 3 % (ref 0.0–5.0)
HCT: 50.6 % (ref 39.0–52.0)
Hemoglobin: 17.1 g/dL — ABNORMAL HIGH (ref 13.0–17.0)
Lymphocytes Relative: 14.1 % (ref 12.0–46.0)
Lymphs Abs: 1.1 10*3/uL (ref 0.7–4.0)
MCHC: 33.7 g/dL (ref 30.0–36.0)
MCV: 99 fL (ref 78.0–100.0)
Monocytes Absolute: 0.9 10*3/uL (ref 0.1–1.0)
Monocytes Relative: 11.3 % (ref 3.0–12.0)
Neutro Abs: 5.7 10*3/uL (ref 1.4–7.7)
Neutrophils Relative %: 70.9 % (ref 43.0–77.0)
Platelets: 246 10*3/uL (ref 150.0–400.0)
RBC: 5.11 Mil/uL (ref 4.22–5.81)
RDW: 14.4 % (ref 11.5–15.5)
WBC: 8.1 10*3/uL (ref 4.0–10.5)

## 2023-04-20 LAB — TSH: TSH: 1.74 u[IU]/mL (ref 0.35–5.50)

## 2023-04-20 NOTE — Progress Notes (Unsigned)
Subjective:  Patient ID: Robert [name], male    DOB: 02/28/1951  Age: 72 y.o. MRN: [id number]  CC: Medical Management of Chronic Issues   HPI Robert [name] presents for DVT, A fib, CHF C/o LBP, sciatica  Outpatient Medications Prior to Visit  Medication Sig Dispense Refill   albuterol (VENTOLIN HFA) 108 (90 Base) MCG/ACT inhaler Inhale 2 puffs into the lungs every 6 (six) hours as needed for wheezing or shortness of breath. 6.7 g 0   allopurinol (ZYLOPRIM) 100 MG tablet Take 1 tablet by mouth once daily 90 tablet 0   celecoxib (CELEBREX) 200 MG capsule Take 1 capsule (200 mg total) by mouth 2 (two) times daily. (Patient taking differently: Take 200 mg by mouth as needed for moderate pain (pain score 4-6).) 14 capsule 0   cetirizine (ZYRTEC) 10 MG tablet Take 1 tablet by mouth once daily (Patient taking differently: Take 10 mg by mouth at bedtime.) 30 tablet 5   Cholecalciferol (VITAMIN D3) 125 MCG (5000 UT) CAPS Take 1 capsule (5,000 Units total) by mouth daily. (Patient taking differently: Take 1 capsule by mouth as needed.) 100 capsule 3   colchicine 0.6 MG tablet TAKE 2 TABLETS BY MOUTH AS NEEDED FOR  GOUT  ATTACK  THEN  TAKE  ANOTHER  ONE  TABLET  IN  1  TO  2  HOURS.  DO  NOT  REPEAT  FOR  3  DAYS. 18 tablet 0   digoxin (LANOXIN) 0.125 MG tablet Take 1 tablet (0.125 mg total) by mouth daily. 90 tablet 3   doxycycline (MONODOX) 100 MG capsule Take 1 capsule (100 mg total) by mouth as directed. 1 hour before any dental work, including cleanings. 6 capsule 6   famotidine (PEPCID) 40 MG tablet Take 1 tablet by mouth once daily (Patient taking differently: Take 40 mg by mouth daily as needed for heartburn or indigestion.) 30 tablet 5   faricimab-svoa (VABYSMO) 6 MG/0.05ML SOLN intravitreal injection 6 mg by Intravitreal route once. Injections every 8 weeks     fish oil-omega-3 fatty acids 1000 MG capsule Take 1 g by mouth every morning.     furosemide (LASIX) 20 MG tablet Take 1  tablet (20 mg total) by mouth daily. May take an additional tablet in the afternoon as needed for weight gain of 2 lbs overnight or 5 lbs in one week. 45 tablet 5   glucosamine-chondroitin 500-400 MG tablet Take 1 tablet by mouth every morning.     mupirocin ointment (BACTROBAN) 2 % Place 1 Application into the nose as needed.     nortriptyline (PAMELOR) 25 MG capsule TAKE 1 CAPSULE AT BEDTIME 90 capsule 3   predniSONE (DELTASONE) 5 MG tablet TAKE 1 TABLET DAILY WITH BREAKFAST 90 tablet 3   pregabalin (LYRICA) 75 MG capsule Take 1 capsule by mouth three times daily as needed (Patient taking differently: as needed.) 90 capsule 1   sacubitril-valsartan (ENTRESTO) 24-26 MG Take 1 tablet by mouth 2 (two) times daily. 60 tablet 2   triamcinolone cream (KENALOG) 0.1 % Apply 1 Application topically 3 (three) times daily. (Patient taking differently: Apply 1 Application topically daily as needed (irritation).) 450 g 1   warfarin (COUMADIN) 3 MG tablet TAKE 1 TABLET DAILY OR TAKE AS DIRECTED BY ANTICOAGULATION CLINIC (Patient taking differently: Take 3 mg by mouth See admin instructions. TAKE 1 TABLET DAILY OR TAKE AS DIRECTED BY ANTICOAGULATION CLINIC; 6 mg (6 mg x 1) every Fri; 3 mg (6 mg x  0.5) all other days) 90 tablet 1   warfarin (COUMADIN) 6 MG tablet TAKE ONE-HALF (1/2) TABLET DAILY EXCEPT TAKE 1 TABLET ON FRIDAYS OR AS DIRECTED BY ANTICOAGULATION CLINIC 60 tablet 1   No facility-administered medications prior to visit.    ROS: Review of Systems  Constitutional:  Positive for fatigue. Negative for appetite change and unexpected weight change.  HENT:  Negative for congestion, nosebleeds, sneezing, sore throat and trouble swallowing.   Eyes:  Negative for itching and visual disturbance.  Respiratory:  Positive for shortness of breath. Negative for cough.   Cardiovascular:  Negative for chest pain, palpitations and leg swelling.  Gastrointestinal:  Negative for abdominal distention, blood in stool,  diarrhea and nausea.  Genitourinary:  Negative for frequency and hematuria.  Musculoskeletal:  Positive for back pain and gait problem. Negative for joint swelling and neck pain.  Skin:  Positive for color change. Negative for rash.  Neurological:  Negative for dizziness, tremors, speech difficulty and weakness.  Psychiatric/Behavioral:  Negative for agitation, dysphoric mood, sleep disturbance and suicidal ideas. The patient is not nervous/anxious.     Objective:  BP 112/80   Pulse 82   Temp 97.8 F (36.6 C) (Temporal)   Ht 5\' 8"  (1.727 m)   Wt 274 lb 2 oz (124.3 kg)   SpO2 98%   BMI 41.68 kg/m   BP Readings from Last 3 Encounters:  04/20/23 112/80  04/07/23 98/60  04/03/23 106/66    Wt Readings from Last 3 Encounters:  04/20/23 274 lb 2 oz (124.3 kg)  04/07/23 284 lb 11.2 oz (129.1 kg)  04/03/23 284 lb (128.8 kg)    Physical Exam Constitutional:      General: He is not in acute distress.    Appearance: He is well-developed.     Comments: NAD  Eyes:     Conjunctiva/sclera: Conjunctivae normal.     Pupils: Pupils are equal, round, and reactive to light.  Neck:     Thyroid: No thyromegaly.     Vascular: No JVD.  Cardiovascular:     Rate and Rhythm: Normal rate and regular rhythm.     Heart sounds: Normal heart sounds. No murmur heard.    No friction rub. No gallop.  Pulmonary:     Effort: Pulmonary effort is normal. No respiratory distress.     Breath sounds: Normal breath sounds. No wheezing or rales.  Chest:     Chest wall: No tenderness.  Abdominal:     General: Bowel sounds are normal. There is no distension.     Palpations: Abdomen is soft. There is no mass.     Tenderness: There is no abdominal tenderness. There is no guarding or rebound.  Musculoskeletal:        General: Swelling present. No tenderness. Normal range of motion.     Cervical back: Normal range of motion.     Right lower leg: Edema present.     Left lower leg: No edema.  Lymphadenopathy:      Cervical: No cervical adenopathy.  Skin:    General: Skin is warm and dry.     Findings: No rash.  Neurological:     Mental Status: He is alert and oriented to person, place, and time.     Cranial Nerves: No cranial nerve deficit.     Motor: No abnormal muscle tone.     Coordination: Coordination normal.     Gait: Gait normal.     Deep Tendon Reflexes: Reflexes are normal and  symmetric.  Psychiatric:        Behavior: Behavior normal.        Thought Content: Thought content normal.        Judgment: Judgment normal.   Using a cane LS w/pain Tilted posture - shoulders/back  Lab Results  Component Value Date   WBC 8.1 04/20/2023   HGB 17.1 (H) 04/20/2023   HCT 50.6 04/20/2023   PLT 246.0 04/20/2023   GLUCOSE 111 (H) 04/20/2023   CHOL 191 03/17/2022   TRIG 168.0 (H) 03/17/2022   HDL 36.20 (L) 03/17/2022   LDLDIRECT 137.0 03/10/2017   LDLCALC 121 (H) 03/17/2022   ALT 26 04/20/2023   AST 31 04/20/2023   NA 136 04/20/2023   K 4.7 04/20/2023   CL 97 04/20/2023   CREATININE 0.99 04/20/2023   BUN 13 04/20/2023   CO2 29 04/20/2023   TSH 1.74 04/20/2023   PSA 0.09 (L) 03/30/2019   INR 3.2 (A) 04/17/2023   HGBA1C 5.8 11/12/2021    No results found.  Assessment & Plan:   Problem List Items Addressed This Visit     DVT of leg (deep venous thrombosis) (HCC)    Coumadin Off compr sock R      B12 deficiency - Primary    On B12      Low back pain    Blue-Emu cream was recommended to use 2-3 times a day Using a Light Wave patch      History of DVT of lower extremity     Cont w/Coumadin      Relevant Orders   Comprehensive metabolic panel (Completed)   CBC with Differential/Platelet (Completed)   TSH (Completed)   S/P TAVR (transcatheter aortic valve replacement)    s/p TAVR  (01/06/23) - doing well      Relevant Orders   Comprehensive metabolic panel (Completed)   CBC with Differential/Platelet (Completed)   TSH (Completed)   BMI 40    [name] lost 30-40   lbs on diet/Cardiac Rehab      CHF (congestive heart failure) (HCC)    Better after TAVR On Entresto      Relevant Orders   Comprehensive metabolic panel (Completed)   CBC with Differential/Platelet (Completed)   TSH (Completed)      No orders of the defined types were placed in this encounter.     Follow-up: Return in about 6 months (around 10/19/2023) for a follow-up visit.  Sonda Primes, MD

## 2023-04-20 NOTE — Assessment & Plan Note (Signed)
Cont w/Coumadin

## 2023-04-20 NOTE — Assessment & Plan Note (Signed)
Coumadin Off compr sock R

## 2023-04-20 NOTE — Assessment & Plan Note (Signed)
[name] lost 30-40  lbs on diet/Cardiac Rehab

## 2023-04-20 NOTE — Assessment & Plan Note (Signed)
On B12 

## 2023-04-20 NOTE — Assessment & Plan Note (Signed)
Better after TAVR On Entresto

## 2023-04-20 NOTE — Assessment & Plan Note (Signed)
Blue-Emu cream was recommended to use 2-3 times a day Using a Light Wave patch 

## 2023-04-20 NOTE — Assessment & Plan Note (Signed)
s/p TAVR  (01/06/23) - doing well

## 2023-04-21 LAB — COMPREHENSIVE METABOLIC PANEL
ALT: 26 U/L (ref 0–53)
AST: 31 U/L (ref 0–37)
Albumin: 4.2 g/dL (ref 3.5–5.2)
Alkaline Phosphatase: 76 U/L (ref 39–117)
BUN: 13 mg/dL (ref 6–23)
CO2: 29 meq/L (ref 19–32)
Calcium: 9.9 mg/dL (ref 8.4–10.5)
Chloride: 97 meq/L (ref 96–112)
Creatinine, Ser: 0.99 mg/dL (ref 0.40–1.50)
GFR: 76.6 mL/min (ref >60.00)
Glucose, Bld: 111 mg/dL — ABNORMAL HIGH (ref 70–99)
Potassium: 4.7 meq/L (ref 3.5–5.1)
Sodium: 136 meq/L (ref 135–145)
Total Bilirubin: 1.1 mg/dL (ref 0.2–1.2)
Total Protein: 7.9 g/dL (ref 6.0–8.3)

## 2023-04-24 ENCOUNTER — Ambulatory Visit (HOSPITAL_BASED_OUTPATIENT_CLINIC_OR_DEPARTMENT_OTHER): Payer: MEDICARE

## 2023-04-24 DIAGNOSIS — I428 Other cardiomyopathies: Secondary | ICD-10-CM | POA: Diagnosis not present

## 2023-04-24 LAB — ECHOCARDIOGRAM COMPLETE
AR max vel: 1.91 cm2
AV Area VTI: 2.02 cm2
AV Area mean vel: 1.81 cm2
AV Mean grad: 9 mm[Hg]
AV Peak grad: 17.1 mm[Hg]
Ao pk vel: 2.07 m/s
Area-P 1/2: 2.48 cm2
Calc EF: 58.1 %
S' Lateral: 4.06 cm
Single Plane A2C EF: 59.6 %
Single Plane A4C EF: 57.9 %

## 2023-04-28 ENCOUNTER — Ambulatory Visit (HOSPITAL_BASED_OUTPATIENT_CLINIC_OR_DEPARTMENT_OTHER): Payer: MEDICARE | Admitting: Family

## 2023-04-30 ENCOUNTER — Ambulatory Visit: Payer: MEDICARE | Admitting: Internal Medicine

## 2023-05-03 NOTE — Progress Notes (Unsigned)
Electrophysiology Office Note:   Date:  05/04/2023  ID:  LEONA ESPERANZA, DOB 09-12-1950, MRN [id number]  Primary Cardiologist: Jodelle Red, MD Electrophysiologist: Nobie Putnam, MD      History of Present Illness:   OCTAVION LEAP is a 72 y.o. male with h/o severe AS s/p TAVR 01/06/2023, severe Covid infection requiring prolonged hospitalization, atrial fibrillation, prior DVT/PE, dyslipidemia, obesity, paroxysmal SVT and HTN who is being seen today for Electrophysiology evaluation of atrial fibrillation at the request of Dr. Cristal Deer.   Since last visit, patient continues to be symptomatic, primarily with shortness of breath, both at rest and with minimal activity.  He also endorses orthopnea and lower extremity edema.  He has been taking Entresto but believes this is making him feel worse.  He has been compliant with all of his other heart failure regimen.   Review of systems complete and found to be negative unless listed in HPI.   EP Information / Studies Reviewed:    EKG is not ordered today. EKG from 04/03/23 reviewed which showed atrial fibrillation and LBBB.  Echo 02/16/23:  Moderately decreased LV function, LVEF 30 to 35%.  Global hypokinesis. Mildly reduced RV size and systolic function.  Echo 04/03/23:  Mildly decreased LV function, LVEF 40 to 45%. Normal RV size and function. Moderately dilated left atrium. Right atrial mildly dilated. Status post TAVR.  No significant valvular disease.  Risk Assessment/Calculations:    CHA2DS2-VASc Score = 3   This indicates a 3.2% annual risk of stroke. The patient's score is based upon: CHF History: 1 HTN History: 1 Diabetes History: 0 Stroke History: 0 Vascular Disease History: 0 Age Score: 1 Gender Score: 0             Physical Exam:   VS:  BP 118/60   Pulse 80   Ht 5\' 8"  (1.727 m)   Wt 270 lb (122.5 kg)   SpO2 96%   BMI 41.05 kg/m    Wt Readings from Last 3 Encounters:  05/04/23 270 lb (122.5  kg)  04/20/23 274 lb 2 oz (124.3 kg)  04/07/23 284 lb 11.2 oz (129.1 kg)     GEN: Well nourished, well developed in no acute distress NECK: No JVD sitting upright CARDIAC: Irregularly irregular rate and rhythm, no murmurs, rubs, gallops RESPIRATORY:  Clear to auscultation without rales, wheezing or rhonchi  ABDOMEN: Soft, non-tender, non-distended EXTREMITIES:  1+ edema (R>L); No deformity   ASSESSMENT AND PLAN:   QUINTARIOUS LUTES is a 72 y.o. male with h/o severe AS s/p TAVR 01/06/2023, severe Covid infection requiring prolonged hospitalization, atrial fibrillation, prior DVT/PE, dyslipidemia, obesity, paroxysmal SVT who is being seen today for Electrophysiology evaluation of atrial fibrillation at the request of Dr. Cristal Deer.   His LV ejection fraction pre-TAVR was reported as 50 to 55%.  Post TAVR he developed a new left bundle branch block and 1 month later his LVEF was reported as 30 to 35%.  He has clear dyssynchrony on his most recent echo which did not appear to be as pronounced prior to development of left bundle branch block.  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 His symptoms are consistent with heart failure and seem to correlate with the decline in his LVEF. He has been in atrial fibrillation for many years and does not appear to be overly symptomatic from this, at least not knowingly.  Following the drop in his LVEF he was started on GDMT and echocardiogram was  repeated with EF now reported at 40 to 45%.  #. LBBB #. Chronic systolic heart failure: He has a drop in his LVEF after developing a new left bundle branch block post TAVR.  He has dyssynchrony on his echocardiogram.   -He has only recently been started on GDMT for his new heart failure diagnosis.Unfortunately, he has an intolerance to beta-blockers and his hyperkalemia limits use of MRAs.  -He remains with NYHA class III heart failure symptoms in the setting of left bundle branch  block.  Given that his EF is now above 35%, I think it would be best to implant a CRT-P for resynchronization purposes. -We will aim for INR around 2.0 at time of implant. I have contacted the nurse who manages his warfarin. -Explained risks, benefits, and alternatives to CRT-P implantation, including but not limited to bleeding, infection, damage to heart or lungs, heart attack, stroke, or death.  Pt verbalized understanding and agrees to proceed if indicated.   #. Longstanding persistent atrial fibrillation: Unclear how symptomatic he is. I feel his new cardiomyopathy and CHF are main drivers of his symptoms at this time. He thinks occasionally that he goes back into sinus rhythm based on readings from his BP machine.  -After implanting CRT-P if he remains symptomatic or percent BiV pacing is suboptimal, then we will proceed with admission for Tikosyn loading.  #. Secondary hypercoagulable state due to atrial fibrillation: - Continue Warfarin.    Total time of encounter: 62 minutes total time of encounter, including face-to-face chart review, patient care, coordination of care and counseling regarding high complexity medical decision making.   Signed, Nobie Putnam, MD

## 2023-05-03 NOTE — H&P (View-Only) (Signed)
Electrophysiology Office Note:   Date:  05/04/2023  ID:  CORNEIL SCHULL, DOB 15-Jul-1950, MRN [id number]  Primary Cardiologist: Jodelle Red, MD Electrophysiologist: Nobie Putnam, MD      History of Present Illness:   BLADIMIR YAPP is a 72 y.o. male with h/o severe AS s/p TAVR 01/06/2023, severe Covid infection requiring prolonged hospitalization, atrial fibrillation, prior DVT/PE, dyslipidemia, obesity, paroxysmal SVT and HTN who is being seen today for Electrophysiology evaluation of atrial fibrillation at the request of Dr. Cristal Deer.   Since last visit, patient continues to be symptomatic, primarily with shortness of breath, both at rest and with minimal activity.  He also endorses orthopnea and lower extremity edema.  He has been taking Entresto but believes this is making him feel worse.  He has been compliant with all of his other heart failure regimen.   Review of systems complete and found to be negative unless listed in HPI.   EP Information / Studies Reviewed:    EKG is not ordered today. EKG from 04/03/23 reviewed which showed atrial fibrillation and LBBB.  Echo 02/16/23:  Moderately decreased LV function, LVEF 30 to 35%.  Global hypokinesis. Mildly reduced RV size and systolic function.  Echo 04/03/23:  Mildly decreased LV function, LVEF 40 to 45%. Normal RV size and function. Moderately dilated left atrium. Right atrial mildly dilated. Status post TAVR.  No significant valvular disease.  Risk Assessment/Calculations:    CHA2DS2-VASc Score = 3   This indicates a 3.2% annual risk of stroke. The patient's score is based upon: CHF History: 1 HTN History: 1 Diabetes History: 0 Stroke History: 0 Vascular Disease History: 0 Age Score: 1 Gender Score: 0             Physical Exam:   VS:  BP 118/60   Pulse 80   Ht 5\' 8"  (1.727 m)   Wt 270 lb (122.5 kg)   SpO2 96%   BMI 41.05 kg/m    Wt Readings from Last 3 Encounters:  05/04/23 270 lb (122.5  kg)  04/20/23 274 lb 2 oz (124.3 kg)  04/07/23 284 lb 11.2 oz (129.1 kg)     GEN: Well nourished, well developed in no acute distress NECK: No JVD sitting upright CARDIAC: Irregularly irregular rate and rhythm, no murmurs, rubs, gallops RESPIRATORY:  Clear to auscultation without rales, wheezing or rhonchi  ABDOMEN: Soft, non-tender, non-distended EXTREMITIES:  1+ edema (R>L); No deformity   ASSESSMENT AND PLAN:   RAKEM RABUN is a 72 y.o. male with h/o severe AS s/p TAVR 01/06/2023, severe Covid infection requiring prolonged hospitalization, atrial fibrillation, prior DVT/PE, dyslipidemia, obesity, paroxysmal SVT who is being seen today for Electrophysiology evaluation of atrial fibrillation at the request of Dr. Cristal Deer.   His LV ejection fraction pre-TAVR was reported as 50 to 55%.  Post TAVR he developed a new left bundle branch block and 1 month later his LVEF was reported as 30 to 35%.  He has clear dyssynchrony on his most recent echo which did not appear to be as pronounced prior to development of left bundle branch block.  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 His symptoms are consistent with heart failure and seem to correlate with the decline in his LVEF. He has been in atrial fibrillation for many years and does not appear to be overly symptomatic from this, at least not knowingly.  Following the drop in his LVEF he was started on GDMT and echocardiogram was  repeated with EF now reported at 40 to 45%.  #. LBBB #. Chronic systolic heart failure: He has a drop in his LVEF after developing a new left bundle branch block post TAVR.  He has dyssynchrony on his echocardiogram.   -He has only recently been started on GDMT for his new heart failure diagnosis.Unfortunately, he has an intolerance to beta-blockers and his hyperkalemia limits use of MRAs.  -He remains with NYHA class III heart failure symptoms in the setting of left bundle branch  block.  Given that his EF is now above 35%, I think it would be best to implant a CRT-P for resynchronization purposes. -We will aim for INR around 2.0 at time of implant. I have contacted the nurse who manages his warfarin. -Explained risks, benefits, and alternatives to CRT-P implantation, including but not limited to bleeding, infection, damage to heart or lungs, heart attack, stroke, or death.  Pt verbalized understanding and agrees to proceed if indicated.   #. Longstanding persistent atrial fibrillation: Unclear how symptomatic he is. I feel his new cardiomyopathy and CHF are main drivers of his symptoms at this time. He thinks occasionally that he goes back into sinus rhythm based on readings from his BP machine.  -After implanting CRT-P if he remains symptomatic or percent BiV pacing is suboptimal, then we will proceed with admission for Tikosyn loading.  #. Secondary hypercoagulable state due to atrial fibrillation: - Continue Warfarin.    Total time of encounter: 62 minutes total time of encounter, including face-to-face chart review, patient care, coordination of care and counseling regarding high complexity medical decision making.   Signed, Nobie Putnam, MD

## 2023-05-04 ENCOUNTER — Encounter: Payer: Self-pay | Admitting: Cardiology

## 2023-05-04 ENCOUNTER — Ambulatory Visit: Payer: MEDICARE | Attending: Cardiology | Admitting: Cardiology

## 2023-05-04 VITALS — BP 118/60 | HR 80 | Ht 68.0 in | Wt 270.0 lb

## 2023-05-04 DIAGNOSIS — I4819 Other persistent atrial fibrillation: Secondary | ICD-10-CM

## 2023-05-04 DIAGNOSIS — D6869 Other thrombophilia: Secondary | ICD-10-CM | POA: Diagnosis not present

## 2023-05-04 DIAGNOSIS — I447 Left bundle-branch block, unspecified: Secondary | ICD-10-CM

## 2023-05-04 DIAGNOSIS — I4811 Longstanding persistent atrial fibrillation: Secondary | ICD-10-CM

## 2023-05-04 DIAGNOSIS — I5022 Chronic systolic (congestive) heart failure: Secondary | ICD-10-CM | POA: Diagnosis not present

## 2023-05-04 NOTE — Patient Instructions (Signed)
Medication Instructions:  Your physician recommends that you continue on your current medications as directed. Please refer to the Current Medication list given to you today.  *If you need a refill on your cardiac medications before your next appointment, please call your pharmacy*   Testing/Procedures: Your physician has requested that you have a cardiac MRI. Cardiac MRI uses a computer to create images of your heart as its beating, producing both still and moving pictures of your heart and major blood vessels. For further information please visit [email or web address]. Please follow the instruction sheet given to you today for more information.  Follow-Up: At [facility], you and your health needs are our priority.  As part of our continuing mission to provide you with exceptional heart care, we have created designated Provider Care Teams.  These Care Teams include your primary Cardiologist (physician) and Advanced Practice Providers (APPs -  Physician Assistants and Nurse Practitioners) who all work together to provide you with the care you need, when you need it.   Your next appointment:   We will call you to set up your next appointments.   Other Instructions:  Tikosyn (Dofetilide) Hospital Admission   Prior to day of admission:  Check with drug insurance company for cost of drug to ensure affordability --- Dofetilide 500 mcg twice a day.  GoodRx is an option if insurance copay is unaffordable.    No Benadryl is allowed 3 days prior to admission.   Please ensure no missed doses of your anticoagulation (blood thinner) for 3 weeks prior to admission. If a dose is missed please notify our office immediately.   A pharmacist will review all your medications for potential interactions with Tikosyn. If any medication changes are needed prior to admission we will be in touch with you.   If any new medications are started AFTER your admission date is set with Radio producer. Please notify  our office immediately so your medication list can be updated and reviewed by our pharmacist again.  On day of admission:  Tikosyn initiation requires a 3 night/4 day hospital stay with constant telemetry monitoring. You will have an EKG after each dose of Tikosyn as well as daily lab draws.   If the drug does not convert you to normal rhythm a cardioversion after the 4th dose of Tikosyn.   [facility] office visit on the morning of admission is needed for preliminary labs/ekg.   Time of admission is dependent on bed availability in the hospital. In some instances, you will be sent home until bed is available. Rarely admission can be delayed to the following day if hospital census prevents available beds.   You may bring personal belongings/clothing with you to the hospital. Please leave your suitcase in the car until you arrive in admissions.   Questions please call our office at [phone]

## 2023-05-05 ENCOUNTER — Ambulatory Visit (INDEPENDENT_AMBULATORY_CARE_PROVIDER_SITE_OTHER): Payer: MEDICARE

## 2023-05-05 ENCOUNTER — Telehealth: Payer: Self-pay

## 2023-05-05 DIAGNOSIS — Z7901 Long term (current) use of anticoagulants: Secondary | ICD-10-CM | POA: Diagnosis not present

## 2023-05-05 LAB — POCT INR: INR: 2.5 (ref 2.0–3.0)

## 2023-05-05 NOTE — Progress Notes (Cosign Needed Addendum)
Per cardiology, pt will have pacemaker placed on 11/21. They are requesting his INR be around 2.0. Pt in today for INR check for any necessary adjustments. Hold dose today and then continue 1/2 tablet daily except take 1 tablet Fridays.  Recheck on 11/15.  Medical screening examination/treatment/procedure(s) were performed by non-physician practitioner and as supervising physician I was immediately available for consultation/collaboration.  I agree with above. Jacinta Shoe, MD

## 2023-05-05 NOTE — Telephone Encounter (Signed)
Per cardiology,  Hi Robert [name],  I am planning to put in a pacemaker in this gentleman next week. I would like his INR around 2.0. It has been running above 3.0. Can you help [place] make this change?   Nobie Putnam, MD  Cardiac Electrophysiology    Pt is scheduled for pacemaker on 11/21.   Contacted pt and scheduled for INR check for today. Pt verbalized understanding.

## 2023-05-05 NOTE — Patient Instructions (Addendum)
Pre visit review using our clinic review tool, if applicable. No additional management support is needed unless otherwise documented below in the visit note.  Hold dose today and then continue 1/2 tablet daily except take 1 tablet Fridays.  Recheck on 11/15.

## 2023-05-07 ENCOUNTER — Other Ambulatory Visit: Payer: Self-pay | Admitting: Cardiology

## 2023-05-08 ENCOUNTER — Ambulatory Visit (INDEPENDENT_AMBULATORY_CARE_PROVIDER_SITE_OTHER): Payer: MEDICARE

## 2023-05-08 DIAGNOSIS — Z7901 Long term (current) use of anticoagulants: Secondary | ICD-10-CM | POA: Diagnosis not present

## 2023-05-08 LAB — POCT INR: INR: 2.3 (ref 2.0–3.0)

## 2023-05-08 NOTE — Progress Notes (Signed)
Per cardiology, pt will have pacemaker placed on 11/21. They are requesting his INR be around 2.0. Pt in today for INR check for any necessary adjustments. Reduce dose today to take 1/2 tablet and then continue 1/2 tablet daily except take 1 tablet Fridays.  Recheck on 11/19.

## 2023-05-08 NOTE — Patient Instructions (Addendum)
Pre visit review using our clinic review tool, if applicable. No additional management support is needed unless otherwise documented below in the visit note.  Reduce dose today to take 1/2 tablet and then continue 1/2 tablet daily except take 1 tablet Fridays.  Recheck on 11/19.

## 2023-05-12 ENCOUNTER — Ambulatory Visit (INDEPENDENT_AMBULATORY_CARE_PROVIDER_SITE_OTHER): Payer: MEDICARE

## 2023-05-12 DIAGNOSIS — Z7901 Long term (current) use of anticoagulants: Secondary | ICD-10-CM | POA: Diagnosis not present

## 2023-05-12 LAB — POCT INR: INR: 1.9 — AB (ref 2.0–3.0)

## 2023-05-12 NOTE — Patient Instructions (Addendum)
Pre visit review using our clinic review tool, if applicable. No additional management support is needed unless otherwise documented below in the visit note.  Take 1/2 tablet today and hold dose tomorrow and follow cardiology instructions on when to restart dosing. Restart dosing at their advisement at current dosing of 1/2 tablet daily except take 1 tablet on Fridays. Recheck on 12/3.

## 2023-05-12 NOTE — Progress Notes (Cosign Needed Addendum)
Per cardiology, pt will have pacemaker placed on 11/21. They are requesting his INR be around 2.0. Pt in today for INR check for any necessary adjustments. Take 1/2 tablet today and hold dose tomorrow and follow cardiology instructions on when to restart dosing. Restart dosing at their advisement at current dosing of 1/2 tablet daily except take 1 tablet on Fridays. Recheck on 12/3.  Medical screening examination/treatment/procedure(s) were performed by non-physician practitioner and as supervising physician I was immediately available for consultation/collaboration.  I agree with above. Jacinta Shoe, MD

## 2023-05-13 NOTE — Pre-Procedure Instructions (Signed)
Instructed patient on the following items: Arrival time 0515 Nothing to eat or drink after midnight No meds AM of procedure Responsible person to drive you home and stay with you for 24 hrs Wash with special soap night before and morning of procedure If on anti-coagulant drug instructions Coumadin-last dose 11/19.

## 2023-05-14 ENCOUNTER — Ambulatory Visit (HOSPITAL_COMMUNITY)
Admission: RE | Admit: 2023-05-14 | Discharge: 2023-05-14 | Disposition: A | Discharge status: Home / Self Care | Payer: MEDICARE | Source: Ambulatory Visit | Attending: Cardiology | Admitting: Cardiology

## 2023-05-14 ENCOUNTER — Other Ambulatory Visit: Payer: Self-pay

## 2023-05-14 ENCOUNTER — Ambulatory Visit (HOSPITAL_COMMUNITY): Payer: MEDICARE

## 2023-05-14 ENCOUNTER — Encounter (HOSPITAL_COMMUNITY)
Admission: RE | Disposition: A | Discharge status: Home / Self Care | Payer: Self-pay | Source: Ambulatory Visit | Attending: Cardiology

## 2023-05-14 DIAGNOSIS — I5022 Chronic systolic (congestive) heart failure: Secondary | ICD-10-CM | POA: Diagnosis not present

## 2023-05-14 DIAGNOSIS — I517 Cardiomegaly: Secondary | ICD-10-CM | POA: Diagnosis not present

## 2023-05-14 DIAGNOSIS — I4891 Unspecified atrial fibrillation: Secondary | ICD-10-CM

## 2023-05-14 DIAGNOSIS — Z86711 Personal history of pulmonary embolism: Secondary | ICD-10-CM | POA: Diagnosis not present

## 2023-05-14 DIAGNOSIS — Z86718 Personal history of other venous thrombosis and embolism: Secondary | ICD-10-CM | POA: Insufficient documentation

## 2023-05-14 DIAGNOSIS — Z950 Presence of cardiac pacemaker: Secondary | ICD-10-CM | POA: Diagnosis not present

## 2023-05-14 DIAGNOSIS — D6869 Other thrombophilia: Secondary | ICD-10-CM | POA: Insufficient documentation

## 2023-05-14 DIAGNOSIS — I4811 Longstanding persistent atrial fibrillation: Secondary | ICD-10-CM | POA: Diagnosis not present

## 2023-05-14 DIAGNOSIS — I110 Hypertensive heart disease with heart failure: Secondary | ICD-10-CM | POA: Diagnosis not present

## 2023-05-14 DIAGNOSIS — Z952 Presence of prosthetic heart valve: Secondary | ICD-10-CM | POA: Insufficient documentation

## 2023-05-14 DIAGNOSIS — Z7901 Long term (current) use of anticoagulants: Secondary | ICD-10-CM | POA: Insufficient documentation

## 2023-05-14 DIAGNOSIS — I447 Left bundle-branch block, unspecified: Secondary | ICD-10-CM | POA: Insufficient documentation

## 2023-05-14 DIAGNOSIS — Z01818 Encounter for other preprocedural examination: Secondary | ICD-10-CM

## 2023-05-14 DIAGNOSIS — Z8616 Personal history of COVID-19: Secondary | ICD-10-CM | POA: Insufficient documentation

## 2023-05-14 DIAGNOSIS — R001 Bradycardia, unspecified: Secondary | ICD-10-CM | POA: Diagnosis present

## 2023-05-14 DIAGNOSIS — I700 Atherosclerosis of aorta: Secondary | ICD-10-CM | POA: Diagnosis not present

## 2023-05-14 HISTORY — PX: BIV PACEMAKER INSERTION CRT-P: EP1199

## 2023-05-14 LAB — PROTIME-INR
INR: 1.7 — ABNORMAL HIGH (ref 0.8–1.2)
Prothrombin Time: 20 s — ABNORMAL HIGH (ref 11.4–15.2)

## 2023-05-14 SURGERY — BIV PACEMAKER INSERTION CRT-P
Anesthesia: LOCAL

## 2023-05-14 MED ORDER — FENTANYL CITRATE (PF) 100 MCG/2ML IJ SOLN
INTRAMUSCULAR | Status: DC | PRN
  Administered 2023-05-14 (×3): 50 ug via INTRAVENOUS
  Administered 2023-05-14: 25 ug via INTRAVENOUS
  Administered 2023-05-14 (×2): 50 ug via INTRAVENOUS
  Administered 2023-05-14: 25 ug via INTRAVENOUS

## 2023-05-14 MED ORDER — GENTAMICIN SULFATE 40 MG/ML IJ SOLN: 80.0000 mg | INTRAMUSCULAR | Status: AC

## 2023-05-14 MED ORDER — HEPARIN (PORCINE) IN NACL 1000-0.9 UT/500ML-% IV SOLN
INTRAVENOUS | Status: DC | PRN
  Administered 2023-05-14: 500 mL

## 2023-05-14 MED ORDER — VANCOMYCIN HCL 1500 MG/300ML IV SOLN
1500.0000 mg | INTRAVENOUS | Status: AC
  Administered 2023-05-14: 1500 mg via INTRAVENOUS
  Filled 2023-05-14: qty 300

## 2023-05-14 MED ORDER — LIDOCAINE HCL 1 % IJ SOLN
INTRAMUSCULAR | Status: AC
Start: 2023-05-14 — End: ?
  Filled 2023-05-14: qty 60

## 2023-05-14 MED ORDER — SODIUM CHLORIDE 0.9 % IV SOLN
INTRAVENOUS | Status: AC
  Administered 2023-05-14: 80 mg
  Filled 2023-05-14: qty 2

## 2023-05-14 MED ORDER — FENTANYL CITRATE (PF) 100 MCG/2ML IJ SOLN
INTRAMUSCULAR | Status: AC
  Filled 2023-05-14: qty 2

## 2023-05-14 MED ORDER — POVIDONE-IODINE 10 % EX SWAB
2.0000 | Freq: Once | CUTANEOUS | Status: AC
  Administered 2023-05-14: 2 via TOPICAL

## 2023-05-14 MED ORDER — CEFAZOLIN IN SODIUM CHLORIDE 3-0.9 GM/100ML-% IV SOLN
3.0000 g | INTRAVENOUS | Status: DC
  Filled 2023-05-14: qty 100

## 2023-05-14 MED ORDER — IOHEXOL 350 MG/ML SOLN
INTRAVENOUS | Status: DC | PRN
  Administered 2023-05-14 (×2): 5 mL
  Administered 2023-05-14: 6 mL
  Administered 2023-05-14: 5 mL

## 2023-05-14 MED ORDER — MIDAZOLAM HCL 5 MG/5ML IJ SOLN
INTRAMUSCULAR | Status: DC | PRN
  Administered 2023-05-14: 1 mg via INTRAVENOUS
  Administered 2023-05-14: .5 mg via INTRAVENOUS
  Administered 2023-05-14: 1 mg via INTRAVENOUS
  Administered 2023-05-14: .5 mg via INTRAVENOUS
  Administered 2023-05-14 (×2): 1 mg via INTRAVENOUS

## 2023-05-14 MED ORDER — ONDANSETRON HCL 4 MG/2ML IJ SOLN: 4.0000 mg | Freq: Four times a day (QID) | INTRAMUSCULAR | Status: DC | PRN

## 2023-05-14 MED ORDER — SODIUM CHLORIDE 0.9 % IV SOLN
INTRAVENOUS | Status: DC
Start: 2023-05-14 — End: 2023-05-14

## 2023-05-14 MED ORDER — MIDAZOLAM HCL 5 MG/5ML IJ SOLN
INTRAMUSCULAR | Status: AC
  Filled 2023-05-14: qty 5

## 2023-05-14 MED ORDER — LIDOCAINE HCL (PF) 1 % IJ SOLN
INTRAMUSCULAR | Status: DC | PRN
  Administered 2023-05-14: 50 mL

## 2023-05-14 MED ORDER — VANCOMYCIN HCL 1500 MG/300ML IV SOLN: 1500.0000 mg | Freq: Once | INTRAVENOUS | Status: DC

## 2023-05-14 SURGICAL SUPPLY — 28 items
ADAPTER SEALING SSSA-09 (ADAPTER) IMPLANT
BALLN ATTAIN 80 (BALLOONS) ×1
BALLOON ATTAIN 80 (BALLOONS) IMPLANT
CABLE SURGICAL S-101-97-12 (CABLE) ×1 IMPLANT
CATH ATTAIN COM SURV 6250V-EH (CATHETERS) IMPLANT
CATH ATTAIN COM SURV 6250V-MB2 (CATHETERS) IMPLANT
CATH CPS LOCATOR 3D SM (CATHETERS) IMPLANT
CATH DYNAMIC STEER 6FR REP (CATHETERS) IMPLANT
CATH HIS SELECTSITE C304HIS (CATHETERS) IMPLANT
CATH JOSEPH QUAD ALLRED 6F REP (CATHETERS) IMPLANT
CATH RIGHTSITE C315HIS02 (CATHETERS) IMPLANT
DEVICE CRTP PERCEPTA QUAD MRI (Pacemaker) IMPLANT
GUIDEWIRE SAFE TJ AMPLATZ EXST (WIRE) IMPLANT
LEAD ATTAIN PERFORMA S 4598-88 (Lead) IMPLANT
LEAD CAPSURE NOVUS 5076-52CM (Lead) IMPLANT
LEAD SELECT SECURE 3830 383069 (Lead) IMPLANT
MAT PREVALON FULL STRYKER (MISCELLANEOUS) IMPLANT
PAD DEFIB RADIO PHYSIO CONN (PAD) ×1 IMPLANT
POUCH AIGIS-R ANTIBACT PPM (Mesh General) ×1 IMPLANT
POUCH AIGIS-R ANTIBACT PPM MED (Mesh General) IMPLANT
SELECT SECURE 3830 383069 (Lead) ×1 IMPLANT
SHEATH 7FR PRELUDE SNAP 13 (SHEATH) IMPLANT
SHEATH 9FR PRELUDE SNAP 13 (SHEATH) IMPLANT
SHEATH PROBE COVER 6X72 (BAG) IMPLANT
SLITTER 6232ADJ (MISCELLANEOUS) IMPLANT
TRAY PACEMAKER INSERTION (PACKS) ×1 IMPLANT
WIRE ACUITY WHISPER EDS 4648 (WIRE) IMPLANT
WIRE HI TORQ VERSACORE-J 145CM (WIRE) IMPLANT

## 2023-05-14 NOTE — Interval H&P Note (Addendum)
History and Physical Interval Note:  05/14/2023 7:22 AM  Robert [name]  has presented today for surgery, with the diagnosis of left bundle branch block and NYHA class III systolic heart failure.  Doing relatively well in the interim. Some lower extremity edema. Able to lay flat. The various methods of treatment have been discussed with the patient and family. After consideration of risks, benefits and other options for treatment, the patient has consented to  Procedure(s): BIV PACEMAKER INSERTION CRT-P (N/A) as a surgical intervention.  The patient's history has been reviewed, patient examined, no change in status, stable for surgery.  I have reviewed the patient's chart and labs.  Questions were answered to the patient's satisfaction.     Nobie Putnam

## 2023-05-14 NOTE — Discharge Instructions (Addendum)
After Your Pacemaker   You have a Medtronic Pacemaker  ACTIVITY Do not lift your arm above shoulder height for 1 week after your procedure. After 7 days, you may progress as below.  You should remove your sling 24 hours after your procedure, unless otherwise instructed by your provider.     Thursday May 21, 2023  Friday May 22, 2023 Saturday May 23, 2023 Sunday May 24, 2023   Do not lift, push, pull, or carry anything over 10 pounds with the affected arm until 6 weeks (Thursday June 25, 2023 ) after your procedure.   You may drive AFTER your wound check, unless you have been told otherwise by your provider.   Ask your healthcare provider when you can go back to work   INCISION/Dressing If you are on a blood thinner such as Coumadin, Xarelto, Eliquis, Plavix, or Pradaxa please confirm with your provider when this should be resumed.  If large square, outer bandage is left in place, this can be removed after 24 hours from your procedure. Do not remove steri-strips or glue as below.   If a PRESSURE DRESSING (a bulky dressing that usually goes up over your shoulder) was applied or left in place, please follow instructions given by your provider on when to return to have this removed.   Monitor your Pacemaker site for redness, swelling, and drainage. Call the device clinic at [phone] if you experience these symptoms or fever/chills.  If your incision is sealed with Steri-strips or staples, you may shower 7 days after your procedure or when told by your provider. Do not remove the steri-strips or let the shower hit directly on your site. You may wash around your site with soap and water.    If you were discharged in a sling, please do not wear this during the day more than 48 hours after your surgery unless otherwise instructed. This may increase the risk of stiffness and soreness in your shoulder.   Avoid lotions, ointments, or perfumes over your incision until it  is well-healed.  You may use a hot tub or a pool AFTER your wound check appointment if the incision is completely closed.  Pacemaker Alerts:  Some alerts are vibratory and others beep. These are NOT emergencies. Please call our office to let us know. If this occurs at night or on weekends, it can wait until the next business day. Send a remote transmission.  If your device is capable of reading fluid status (for heart failure), you will be offered monthly monitoring to review this with you.   DEVICE MANAGEMENT Remote monitoring is used to monitor your pacemaker from home. This monitoring is scheduled every 91 days by our office. It allows [place] to keep an eye on the functioning of your device to ensure it is working properly. You will routinely see your Electrophysiologist annually (more often if necessary).   You should receive your ID card for your new device in 4-8 weeks. Keep this card with you at all times once received. Consider wearing a medical alert bracelet or necklace.  Your Pacemaker may be MRI compatible. This will be discussed at your next office visit/wound check.  You should avoid contact with strong electric or magnetic fields.   Do not use amateur (ham) radio equipment or electric (arc) welding torches. MP3 player headphones with magnets should not be used. Some devices are safe to use if held at least 12 inches (30 cm) from your Pacemaker. These include power tools, lawn mowers,  and speakers. If you are unsure if something is safe to use, ask your health care provider.  When using your cell phone, hold it to the ear that is on the opposite side from the Pacemaker. Do not leave your cell phone in a pocket over the Pacemaker.  You may safely use electric blankets, heating pads, computers, and microwave ovens.  Call the office right away if: You have chest pain. You feel more short of breath than you have felt before. You feel more light-headed than you have felt before. Your  incision starts to open up.  This information is not intended to replace advice given to you by your health care provider. Make sure you discuss any questions you have with your health care provider.

## 2023-05-14 NOTE — Progress Notes (Signed)
Talked to MD Jimmey Ralph about a wound check appointment that is missing from the AVS. Per MD Jimmey Ralph, he will set it up tomorrow in clinic for the patient. Per MD Jimmey Ralph he is okay to discharge.

## 2023-05-15 ENCOUNTER — Telehealth: Payer: Self-pay | Admitting: Cardiology

## 2023-05-15 ENCOUNTER — Encounter: Payer: MEDICARE | Attending: Cardiovascular Disease

## 2023-05-15 ENCOUNTER — Encounter (HOSPITAL_COMMUNITY): Payer: Self-pay | Admitting: Cardiology

## 2023-05-15 DIAGNOSIS — I447 Left bundle-branch block, unspecified: Secondary | ICD-10-CM | POA: Insufficient documentation

## 2023-05-15 NOTE — Telephone Encounter (Signed)
Pt having LV stim. Coming to dc at 4 with mdt

## 2023-05-15 NOTE — Telephone Encounter (Signed)
Pt states his pacemaker implant is making his chest jump a little and he wants to know if that is normal. He states he is not in any pain. Please advise.

## 2023-05-18 ENCOUNTER — Telehealth: Payer: Self-pay | Admitting: Pharmacist

## 2023-05-18 NOTE — Telephone Encounter (Signed)
Medication list reviewed in anticipation of upcoming Tikosyn initiation.   Patient is currently taking furosemide,loop Diuretics may enhance the QTc-prolonging effect of Dofetilide. Monitor serum potassium and magnesium more closely when dofetilide is combined with loop diuretics. Electrolyte replacements will likely be required to maintain potassium and magnesium serum concentrations within the normal range prior to, and during, the administration of dofetilide. Other moderate risk QT prolonging agents patient is on are nortriptyline and albuterol.  Patient is anticoagulated on Warfarin on the appropriate dose. Please ensure that patient has not missed any anticoagulation doses in the 3 weeks prior to Tikosyn initiation.   Patient will need to be counseled to avoid use of Benadryl while on Tikosyn and in the 2-3 days prior to Tikosyn initiation.

## 2023-05-18 NOTE — Telephone Encounter (Signed)
-----   Message from Nurse Milta Deiters sent at 05/18/2023  8:36 AM EST ----- Regarding: FW: Tikosyn Loading Please review meds for tikosyn thanks stacy ----- Message ----- From: Danice Goltz, PA Sent: 05/15/2023   5:00 PM EST To: Shona Simpson, RN Subject: FW: Tikosyn Loading                            ----- Message ----- From: Nobie Putnam, MD Sent: 05/15/2023   4:57 PM EST To: Danice Goltz, PA; Eustace Pen, PA-C Subject: Tikosyn Loading                                Can we get this patient in for Tikosyn loading right after Christmas? Like December 26 or 27th? He just had CRT put in and his INR needs to be >2.0 over the next 3-4 weeks. He had some concern about how the hospital stay gets paid for/approved. If there's someone in the clinic that could go over this with him that would be great too.   Thanks, Michele Rockers

## 2023-05-18 NOTE — Telephone Encounter (Signed)
Would recommend to stop nortriptyline.

## 2023-05-19 ENCOUNTER — Encounter (HOSPITAL_COMMUNITY): Payer: Self-pay

## 2023-05-20 NOTE — Progress Notes (Signed)
Cardiology Office Note:  .   Date:  05/20/2023  ID:  Robert [name], DOB 1951/03/25, MRN [id number] PCP: Tresa Garter, MD  [facility] Providers Cardiologist:  Jodelle Red, MD Electrophysiologist:  Nobie Putnam, MD {  History of Present Illness: .   Robert [name] is a 72 y.o. 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 Jimmey Ralph, saw him 05/04/23, waxing/waning reduction in LVEF since the development of LBBB, and Afib management strategies. Planned for CRT-P implant. Unclear how symptomatic he was from his AFib, longstanding persistent, pt suspected sometimes he had normal rhytm.  Though his CM suspect to be the main driver for his fatigue, symptoms. Once post implant planned to consider pursuing Tikosyn admission  Pt called with what sounded of diaphragmatic stim >> seen in device clinic 05/15/23 with RN, industry and MD,    Today's visit is scheduled as a wound check visit/LV lead evaluation  ROS:   He is accompanied by his wife INR today was 2.9 the 1st therapeutic results since implant He denies site pain, occasionally aware of it with certain movements Last evening he felt briefly some diaphragmatic stim, but that was the 1st sinece reprogramming and resolved without more again No CP, palpitations No rest SOB Feels like he has better exertional capacity, walked in from the parking deck today without stopping, this is a clear improvement and feels like he is getting benefit from the device  He is weary of more procedures (ablation) hospitalizations (tikosyn), feels like in the las few years he has really been through a lot and is physically/emotionally fatigued.  R[facility] has recommended Nortriptyline be stopped pre-tikosyn load He takes this for his neuropathy/nback issues, has been on it for many years and has searved  him well He has reached out to his PMD about the process of stopping it/and any replacement options that would be OK with Tikosyn. Noting that his QOL with his hand neuropathy was awful and this medication has really helped him.  Plasma half-life of nortriptyline is between 16 and 90 hours so washout would timeline may be quite long and would need the pharmacist to guide this more clearly  If he/we were to pursue Tikosyn, he would like to shoot for prior to the end of the year or very beginning of the new year from an insurance standpoint   Device information MDT CRT-P implanted 05/14/23 RV lead is in LB position, has CS lead as well  Arrhythmia/AAD hx Amiodarone Oct 2021 started for SVTs and AFib RVR >>> stopped Jan 2022 with progressive wrist/hand neuropathies Anaphylaxis with diltiazem Asthma exacerbations with BBs  Studies Reviewed: [name]    EKG not done today DEVICE interrogation done today and reviewed by myself  Battery and lead measurements are good OptiVol  is not available yet.just starting to gain data He has had a few VS episodes, though mostly brief and are Afib BP% up to 87.8%   04/24/23: TTE 1. Left ventricular ejection fraction, by estimation, is 40 to 45%. The  left ventricle has mildly decreased function. The left ventricle  demonstrates global hypokinesis. Left ventricular diastolic function could  not be evaluated.   2. Right ventricular systolic function is normal. The right ventricular  size is normal. There is mildly elevated pulmonary artery systolic  pressure. The estimated right ventricular systolic pressure is 37.3 mmHg.   3. Left  atrial size was moderately dilated.   4. Right atrial size was mildly dilated.   5. The mitral valve is normal in structure. Mild mitral valve  regurgitation.   6. The aortic valve has been repaired/replaced. Aortic valve  regurgitation is not visualized. There is a CoreValve-Evolut Pro  prosthetic (TAVR) valve present in the  aortic position. Echo findings are  consistent with normal structure and function of the  aortic valve prosthesis. Aortic valve mean gradient measures 9.0 mmHg.  Aortic valve Vmax measures 2.07 m/s. Aortic valve acceleration time  measures 74 msec.   7. The inferior vena cava is normal in size with greater than 50%  respiratory variability, suggesting right atrial pressure of 3 mmHg.    02/16/23: TTE 1. Left ventricular ejection fraction, by estimation, is 30 to 35%. The  left ventricle has moderately decreased function. The left ventricle  demonstrates global hypokinesis. Left ventricular diastolic function could  not be evaluated. There is  incordinate septal motion.   2. Right ventricular systolic function is mildly reduced. The right  ventricular size is normal. There is normal pulmonary artery systolic  pressure. The estimated right ventricular systolic pressure is 34.6 mmHg.   3. The mitral valve is abnormal. Mild mitral valve regurgitation.   4. The aortic valve has been repaired/replaced. Aortic valve  regurgitation is not visualized. There is a 34 Medtronic CoreValve-Evolut  Pro prosthetic (TAVR) valve present in the aortic position. Procedure  Date: 01/06/2023. Echo findings are consistent  with normal structure and function of the aortic valve prosthesis. Aortic  valve area, by VTI measures 2.23 cm. Aortic valve mean gradient measures  9.2 mmHg. Aortic valve Vmax measures 2.09 m/s.   5. The inferior vena cava is normal in size with greater than 50%  respiratory variability, suggesting right atrial pressure of 3 mmHg.   6. Rhythm strip during this exam demonstrates atrial fibrillation.   Comparison(s): Changes from prior study are noted. 01/07/2023: LVEF 50-55%,  TAVR mean gradient 7 mmHg, no PVL. Compared to the prior study, there has  been a decline in LV systolic function.    12/16/22: Cath   Ost RCA to Prox RCA lesion is 20% stenosed.   Mid LAD lesion is 40%  stenosed.   1.  Mild obstructive coronary artery disease. 2.  Fick cardiac output of 7.5 L/min and Fick cardiac index of 2.9 L/min/m with the following hemodynamics: Right atrial pressure mean of 15 mmHg Right ventricular pressure 47/8 with end-diastolic pressure of 15 mmHg Wedge pressure mean 23 mmHg PA pressure 46/23 with a mean of 34 mmHg.  Risk Assessment/Calculations:    Physical Exam:   VS:  There were no vitals taken for this visit.   Wt Readings from Last 3 Encounters:  05/14/23 270 lb (122.5 kg)  05/04/23 270 lb (122.5 kg)  04/20/23 274 lb 2 oz (124.3 kg)    GEN: Well nourished, well developed in no acute distress NECK: No JVD; No carotid bruits CARDIAC: RRR (paced), no murmurs, rubs, gallops RESPIRATORY:   CTA b/l without rales, wheezing or rhonchi  ABDOMEN: Soft, non-tender, non-distended EXTREMITIES:  1+ RLE edema (reported as chronic sine his DVTs, trace if any on the left; No deformity   PPM site: steri strips are removed without difficulty, skin edges are well approximated, no erythema, edema, hematoma, heat, fluctuation, or tethering  ASSESSMENT AND PLAN: .    PPM Longstanding persistent AFib CHA2DS2Vasc is 5, on Warfarin 100 % burden   Tikosyn discussed as above  NICM Waxing/waning LVEF  Now s/p CRT-P He feels better since implant BP remains low but improved Getting SR should help get [place] closer to goal pacing % C/w Dr. Cristal Deer   VHD Hx of TAVR Functioning well by last echos  Secondary hypercoagulable state 2/2 AFib   Dispo: as scheduled, will communicate with pharmacy/AFib clinic regarding his pending tikosyn admit  Signed, Sheilah Pigeon, PA-C

## 2023-05-24 ENCOUNTER — Encounter: Payer: Self-pay | Admitting: Internal Medicine

## 2023-05-24 DIAGNOSIS — I447 Left bundle-branch block, unspecified: Secondary | ICD-10-CM | POA: Insufficient documentation

## 2023-05-24 LAB — CUP PACEART INCLINIC DEVICE CHECK
Date Time Interrogation Session: 20241122140000
Implantable Lead Connection Status: 753985
Implantable Lead Connection Status: 753985
Implantable Lead Connection Status: 753985
Implantable Lead Implant Date: 20241121
Implantable Lead Implant Date: 20241121
Implantable Lead Implant Date: 20241121
Implantable Lead Location: 753858
Implantable Lead Location: 753859
Implantable Lead Location: 753860
Implantable Lead Model: 3830
Implantable Lead Model: 4598
Implantable Lead Model: 5076
Implantable Pulse Generator Implant Date: 20241121
Lead Channel Impedance Value: 456 Ohm
Lead Channel Impedance Value: 532 Ohm
Lead Channel Impedance Value: 665 Ohm
Lead Channel Pacing Threshold Amplitude: 0.5 V
Lead Channel Pacing Threshold Amplitude: 2.25 V
Lead Channel Pacing Threshold Pulse Width: 0.4 ms
Lead Channel Pacing Threshold Pulse Width: 1 ms
Lead Channel Sensing Intrinsic Amplitude: 1.4 mV
Lead Channel Sensing Intrinsic Amplitude: 9 mV
Lead Channel Setting Pacing Amplitude: 2.5 V
Lead Channel Setting Pacing Amplitude: 3.5 V
Lead Channel Setting Pacing Amplitude: 3.5 V
Lead Channel Setting Pacing Pulse Width: 0.4 ms
Lead Channel Setting Pacing Pulse Width: 1 ms
Lead Channel Setting Sensing Sensitivity: 1.2 mV

## 2023-05-24 NOTE — Progress Notes (Signed)
Seen in clinic for probable LV stim post implant. CRTP manually checked in office w/ industry and implanting MD JP present for troubleshooting. With regards to device function, LV functioning less than optimal w/ visible and nonvisible LV stim w/ obvious discomfort; Otherwise testing performed w/ results WDL.  **Effective BV pacing 73.4 %**. Patient enrolled in clinic standard remote monitoring and ROV w/ implanting MD 05/26/23. Patient education completed.   Programming changes made this session. - LRL increased from DDD50 to DDD70 - RV sensitivity increased from 0.51mV to 1.69mV *- LV pacing vector changed from LV3--Can to LV2--Can  *Stim starting at and continued above 4--1 - LV amp. decreased to 2.5-1 w/ 1/4 V safety margin - Max adapted amp. of LV decreased from 6 to 3.5V - LV capture management changed to monitor - ATAF atrial interval decreased from (150 bpm) to (171 bpm) - EGM 3 source (data collection changed to LV2--Can - ATAF daily burden increased from 6h to 24h Changes made per JP .  OF NOTE RV capture threshold listed at 6--1.5. THIS IS INCORRECT. Normal threshold @0 .5--4. Device set to auto and wont increase from acute outputs post implant unless necessary.

## 2023-05-25 ENCOUNTER — Other Ambulatory Visit: Payer: Self-pay | Admitting: Internal Medicine

## 2023-05-26 ENCOUNTER — Ambulatory Visit: Payer: MEDICARE

## 2023-05-26 ENCOUNTER — Ambulatory Visit: Payer: MEDICARE | Attending: Physician Assistant | Admitting: Physician Assistant

## 2023-05-26 ENCOUNTER — Encounter: Payer: Self-pay | Admitting: Physician Assistant

## 2023-05-26 VITALS — BP 122/82 | HR 110 | Ht 68.0 in | Wt 266.0 lb

## 2023-05-26 DIAGNOSIS — I428 Other cardiomyopathies: Secondary | ICD-10-CM

## 2023-05-26 DIAGNOSIS — D6869 Other thrombophilia: Secondary | ICD-10-CM

## 2023-05-26 DIAGNOSIS — Z952 Presence of prosthetic heart valve: Secondary | ICD-10-CM

## 2023-05-26 DIAGNOSIS — I4811 Longstanding persistent atrial fibrillation: Secondary | ICD-10-CM

## 2023-05-26 DIAGNOSIS — Z7901 Long term (current) use of anticoagulants: Secondary | ICD-10-CM

## 2023-05-26 DIAGNOSIS — Z950 Presence of cardiac pacemaker: Secondary | ICD-10-CM

## 2023-05-26 LAB — CUP PACEART INCLINIC DEVICE CHECK
Date Time Interrogation Session: 20241203122314
Implantable Lead Connection Status: 753985
Implantable Lead Connection Status: 753985
Implantable Lead Connection Status: 753985
Implantable Lead Implant Date: 20241121
Implantable Lead Implant Date: 20241121
Implantable Lead Implant Date: 20241121
Implantable Lead Location: 753858
Implantable Lead Location: 753859
Implantable Lead Location: 753860
Implantable Lead Model: 3830
Implantable Lead Model: 4598
Implantable Lead Model: 5076
Implantable Pulse Generator Implant Date: 20241121

## 2023-05-26 LAB — POCT INR: INR: 2.9 (ref 2.0–3.0)

## 2023-05-26 NOTE — Progress Notes (Cosign Needed Addendum)
Pt had pacemaker placed on 11/21. They requested his INR be around 2.0.  Cardiology is now recommending Tikosyn hospital admission in 4 weeks, if pt has 4 weekly in range INR readings. Pt is not sure if he will agree to Tikosyn and has apt with cardiology today for wound check and to discuss future plan of care for Afib. Continue 1/2 tablet daily except take 1 tablet on Fridays. Recheck in 1 week.  Medical screening examination/treatment/procedure(s) were performed by non-physician practitioner and as supervising physician I was immediately available for consultation/collaboration.  I agree with above. Jacinta Shoe, MD

## 2023-05-26 NOTE — Patient Instructions (Addendum)
Pre visit review using our clinic review tool, if applicable. No additional management support is needed unless otherwise documented below in the visit note.  Continue 1/2 tablet daily except take 1 tablet on Fridays. Recheck in 1 week.

## 2023-05-26 NOTE — Patient Instructions (Signed)
Medication Instructions:   Your physician recommends that you continue on your current medications as directed. Please refer to the Current Medication list given to you today.   *If you need a refill on your cardiac medications before your next appointment, please call your pharmacy*   Lab Work: NONE ORDERED  TODAY   If you have labs (blood work) drawn today and your tests are completely normal, you will receive your results only by: MyChart Message (if you have MyChart) OR A paper copy in the mail If you have any lab test that is abnormal or we need to change your treatment, we will call you to review the results.   Testing/Procedures: NONE ORDERED  TODAY   Follow-Up: At [facility], you and your health needs are our priority.  As part of our continuing mission to provide you with exceptional heart care, we have created designated Provider Care Teams.  These Care Teams include your primary Cardiologist (physician) and Advanced Practice Providers (APPs -  Physician Assistants and Nurse Practitioners) who all work together to provide you with the care you need, when you need it.  We recommend signing up for the patient portal called "MyChart".  Sign up information is provided on this After Visit Summary.  MyChart is used to connect with patients for Virtual Visits (Telemedicine).  Patients are able to view lab/test results, encounter notes, upcoming appointments, etc.  Non-urgent messages can be sent to your provider as well.   To learn more about what you can do with MyChart, go to [email or web address].    Your next appointment:  AS SCHEDULED  Provider:   You may see Nobie Putnam, MD or one of the following Advanced Practice Providers on your designated Care Team:   Francis Dowse, [place] Casimiro Needle "Mardelle Matte" Lanna Poche, [place]   Other Instructions

## 2023-05-27 ENCOUNTER — Telehealth: Payer: Self-pay

## 2023-05-27 NOTE — Telephone Encounter (Signed)
Patient has stopped nortriptyline as of 05/27/23 per PCP recommendation.

## 2023-05-27 NOTE — Telephone Encounter (Signed)
Pt reports he will proceed with the recommendation from cardiology and has been scheduled for hospitalization for Tikosyn. Right now he thinks he will be admitted on 12/24 or right after Christmas.  Pt will need 4 in range, weekly INR results, starting with the result obtained yesterday. Pt will need two more weekly INR results in the coumadin clinic and then cardiology will test INR at the admission, which will make 4 weeks. Pt has been scheduled for the next 2 consecutive weeks for coumadin clinic. Pt verbalized understanding.

## 2023-05-29 ENCOUNTER — Other Ambulatory Visit: Payer: Self-pay | Admitting: Internal Medicine

## 2023-05-30 ENCOUNTER — Other Ambulatory Visit: Payer: Self-pay | Admitting: Internal Medicine

## 2023-06-01 ENCOUNTER — Telehealth (HOSPITAL_COMMUNITY): Payer: Self-pay | Admitting: Pharmacy Technician

## 2023-06-01 ENCOUNTER — Other Ambulatory Visit (HOSPITAL_COMMUNITY): Payer: Self-pay

## 2023-06-01 NOTE — Telephone Encounter (Signed)
Patient Product/process development scientist completed.    The patient is insured through [employer]. Patient has Medicare and is not eligible for a copay card, but may be able to apply for patient assistance, if available.    Ran test claim for dofetilide (Tikosyn) 500 mcg and the current 30 day co-pay is $9.99.   This test claim was processed through [facility]- copay amounts may vary at other pharmacies due to pharmacy/plan contracts, or as the patient moves through the different stages of their insurance plan.     Roland Earl, CPHT Pharmacy Technician III Certified Patient Advocate [facility] Patient Advocate Team Direct Number: [phone]  Fax: [phone]

## 2023-06-02 ENCOUNTER — Ambulatory Visit: Payer: MEDICARE

## 2023-06-02 DIAGNOSIS — Z7901 Long term (current) use of anticoagulants: Secondary | ICD-10-CM

## 2023-06-02 LAB — POCT INR: INR: 2.8 (ref 2.0–3.0)

## 2023-06-02 NOTE — Patient Instructions (Addendum)
 Pre visit review using our clinic review tool, if applicable. No additional management support is needed unless otherwise documented below in the visit note.  Continue 1/2 tablet daily except take 1 tablet on Fridays. Recheck in 1 week.

## 2023-06-02 NOTE — Progress Notes (Cosign Needed Addendum)
Pt reports he will proceed with the recommendation from cardiology and has been scheduled for hospitalization for Tikosyn. Pt is scheduled for intake on 12/24. Cardiology needs 4 weekly INR results in range. This is week 2, he will get last INR check at admission.  Continue 1/2 tablet daily except take 1 tablet on Fridays. Recheck in 1 week.   Pt reports he was taken off of nortriptyline due to interaction with Tikosyn. Pt, and his wife in office today, report pt is having a lot of anxiety since stopping the medication and would like a replacement if possible. Advised a msg would be sent to PCP with request and that he should f/u with office. Pt verbalized understanding.   Medical screening examination/treatment/procedure(s) were performed by non-physician practitioner and as supervising physician I was immediately available for consultation/collaboration.  I agree with above. Jacinta Shoe, MD

## 2023-06-02 NOTE — Telephone Encounter (Signed)
Pt in office today for coumadin clinic apt.  Pt reports he was taken off of nortriptyline due to interaction with Tikosyn. Pt, and his wife in office today, report pt is having a lot of anxiety since stopping the medication and would like a replacement if possible. Advised a msg would be sent to PCP with request and that he should f/u with office. Pt verbalized understanding

## 2023-06-03 ENCOUNTER — Other Ambulatory Visit: Payer: Self-pay | Admitting: Internal Medicine

## 2023-06-03 ENCOUNTER — Ambulatory Visit: Payer: MEDICARE

## 2023-06-03 MED ORDER — BUSPIRONE HCL 15 MG PO TABS: 15.0000 mg | ORAL_TABLET | Freq: Two times a day (BID) | ORAL | 5 refills | Status: DC

## 2023-06-03 NOTE — Progress Notes (Signed)
Rx Buspar sent

## 2023-06-09 ENCOUNTER — Ambulatory Visit (INDEPENDENT_AMBULATORY_CARE_PROVIDER_SITE_OTHER): Payer: MEDICARE

## 2023-06-09 DIAGNOSIS — Z7901 Long term (current) use of anticoagulants: Secondary | ICD-10-CM

## 2023-06-09 LAB — POCT INR: INR: 2.8 (ref 2.0–3.0)

## 2023-06-09 NOTE — Progress Notes (Addendum)
Pt reports he will proceed with the recommendation from cardiology and has been scheduled for hospitalization for Tikosyn. Pt is scheduled for intake on 12/24. Cardiology needs 4 weekly INR results in range. This is week 3, he will get last INR check at admission.  Continue 1/2 tablet daily except take 1 tablet on Fridays. Recheck in 1 week with hospital admission. Hospital pharmacist will dose warfarin while in hospital. Contact coumadin clinic when discharged to set up an appointment for INR check.  Medical screening examination/treatment/procedure(s) were performed by non-physician practitioner and as supervising physician I was immediately available for consultation/collaboration.  I agree with above. Jacinta Shoe, MD

## 2023-06-09 NOTE — Patient Instructions (Addendum)
Pre visit review using our clinic review tool, if applicable. No additional management support is needed unless otherwise documented below in the visit note.  Continue 1/2 tablet daily except take 1 tablet on Fridays. Recheck in 1 week with hospital admission. Hospital pharmacist will dose warfarin while in hospital. Contact coumadin clinic when discharged to set up an appointment for INR check.

## 2023-06-11 DIAGNOSIS — H34831 Tributary (branch) retinal vein occlusion, right eye, with macular edema: Secondary | ICD-10-CM | POA: Diagnosis not present

## 2023-06-12 ENCOUNTER — Encounter (HOSPITAL_COMMUNITY): Payer: Self-pay

## 2023-06-16 ENCOUNTER — Encounter (HOSPITAL_BASED_OUTPATIENT_CLINIC_OR_DEPARTMENT_OTHER): Payer: Self-pay

## 2023-06-16 ENCOUNTER — Observation Stay (HOSPITAL_COMMUNITY): Admission: RE | Admit: 2023-06-16 | Payer: MEDICARE | Source: Ambulatory Visit | Admitting: Cardiology

## 2023-06-16 ENCOUNTER — Ambulatory Visit (HOSPITAL_COMMUNITY)
Admission: RE | Admit: 2023-06-16 | Discharge: 2023-06-16 | Disposition: A | Discharge status: Home / Self Care | Payer: MEDICARE | Source: Ambulatory Visit | Attending: Internal Medicine | Admitting: Internal Medicine

## 2023-06-16 VITALS — BP 96/60 | HR 98 | Ht 68.0 in | Wt 254.6 lb

## 2023-06-16 DIAGNOSIS — Z86718 Personal history of other venous thrombosis and embolism: Secondary | ICD-10-CM | POA: Insufficient documentation

## 2023-06-16 DIAGNOSIS — I4811 Longstanding persistent atrial fibrillation: Secondary | ICD-10-CM | POA: Insufficient documentation

## 2023-06-16 DIAGNOSIS — I110 Hypertensive heart disease with heart failure: Secondary | ICD-10-CM | POA: Diagnosis not present

## 2023-06-16 DIAGNOSIS — Z7901 Long term (current) use of anticoagulants: Secondary | ICD-10-CM | POA: Diagnosis not present

## 2023-06-16 DIAGNOSIS — I350 Nonrheumatic aortic (valve) stenosis: Secondary | ICD-10-CM | POA: Insufficient documentation

## 2023-06-16 DIAGNOSIS — Z79899 Other long term (current) drug therapy: Secondary | ICD-10-CM | POA: Diagnosis not present

## 2023-06-16 DIAGNOSIS — E669 Obesity, unspecified: Secondary | ICD-10-CM | POA: Insufficient documentation

## 2023-06-16 DIAGNOSIS — D6869 Other thrombophilia: Secondary | ICD-10-CM | POA: Insufficient documentation

## 2023-06-16 DIAGNOSIS — Z86711 Personal history of pulmonary embolism: Secondary | ICD-10-CM | POA: Diagnosis not present

## 2023-06-16 DIAGNOSIS — E785 Hyperlipidemia, unspecified: Secondary | ICD-10-CM | POA: Diagnosis not present

## 2023-06-16 DIAGNOSIS — I255 Ischemic cardiomyopathy: Secondary | ICD-10-CM | POA: Insufficient documentation

## 2023-06-16 DIAGNOSIS — Z6838 Body mass index (BMI) 38.0-38.9, adult: Secondary | ICD-10-CM | POA: Diagnosis not present

## 2023-06-16 DIAGNOSIS — I509 Heart failure, unspecified: Secondary | ICD-10-CM | POA: Diagnosis not present

## 2023-06-16 LAB — MAGNESIUM: Magnesium: 2 mg/dL (ref 1.7–2.4)

## 2023-06-16 LAB — BASIC METABOLIC PANEL
Anion gap: 12 (ref 5–15)
BUN: 14 mg/dL (ref 8–23)
CO2: 23 mmol/L (ref 22–32)
Calcium: 9.3 mg/dL (ref 8.9–10.3)
Chloride: 100 mmol/L (ref 98–111)
Creatinine, Ser: 1.13 mg/dL (ref 0.61–1.24)
GFR, Estimated: >60 mL/min (ref >60)
Glucose, Bld: 156 mg/dL — ABNORMAL HIGH (ref 70–99)
Potassium: 4.4 mmol/L (ref 3.5–5.1)
Sodium: 135 mmol/L (ref 135–145)

## 2023-06-16 LAB — PROTIME-INR
INR: 1.9 — ABNORMAL HIGH (ref 0.8–1.2)
Prothrombin Time: 22 s — ABNORMAL HIGH (ref 11.4–15.2)

## 2023-06-16 NOTE — Addendum Note (Signed)
Encounter addended by: Eustace Pen, PA-C on: 06/16/2023 10:01 AM  Actions taken: Clinical Note Signed

## 2023-06-16 NOTE — Progress Notes (Addendum)
Primary Care Physician: Tresa Garter, MD Primary Cardiologist: Jodelle Red, MD Electrophysiologist: Nobie Putnam, MD     Referring Physician: Francis Dowse, PA-C     Robert [name] is a 72 y.o. 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 Patient is here for Tikosyn admission. Patient is on coumadin for a CHADS2VASC score of 3.  On evaluation today 06/16/23, patient is here for Tikosyn admission. No new medications since last OV. He stopped nortriptyline as advised by pharmacy. No benadryl use. No missed doses of anticoagulant. INRs therapeutic in the past 3 weeks.   Today, he denies symptoms of palpitations, chest pain, shortness of breath, orthopnea, PND, lower extremity edema, dizziness, presyncope, syncope, snoring, daytime somnolence, bleeding, or neurologic sequela. The patient is tolerating medications without difficulties and is otherwise without complaint today.   he has a BMI of Body mass index is 38.71 kg/m.[name] Filed Weights   06/16/23 0833  Weight: 115.5 kg    Current Outpatient Medications  Medication Sig Dispense Refill   albuterol (VENTOLIN HFA) 108 (90 Base) MCG/ACT inhaler Inhale 2 puffs into the lungs every 6 (six) hours as needed for wheezing or shortness of breath. 6.7 g 0   allopurinol (ZYLOPRIM) 100 MG tablet Take 1 tablet by mouth once daily 90 tablet 1   celecoxib (CELEBREX) 200 MG capsule Take 1 capsule (200 mg total) by mouth 2 (two) times daily. (Patient taking differently: Take 200 mg by mouth as needed for moderate pain (pain score 4-6).) 14 capsule 0   cetirizine (ZYRTEC) 10 MG tablet Take 1 tablet by mouth once daily 90 tablet 3   colchicine 0.6 MG tablet TAKE 2 TABLETS BY MOUTH AS NEEDED FOR  GOUT  ATTACK  THEN  TAKE   ANOTHER  ONE  TABLET  IN  1  TO  2  HOURS.  DO  NOT  REPEAT  FOR  3  DAYS. 18 tablet 0   digoxin (LANOXIN) 0.125 MG tablet Take 1 tablet (0.125 mg total) by mouth daily. 90 tablet 3   doxycycline (MONODOX) 100 MG capsule Take 1 capsule (100 mg total) by mouth as directed. 1 hour before any dental work, including cleanings. 6 capsule 6   famotidine (PEPCID) 40 MG tablet Take 1 tablet by mouth once daily (Patient taking differently: Take 40 mg by mouth daily. Only when he takes Celebrex) 30 tablet 5   faricimab-svoa (VABYSMO) 6 MG/0.05ML SOLN intravitreal injection 6 mg by Intravitreal route once. Injections every 9 weeks     fish oil-omega-3 fatty acids 1000 MG capsule Take 1 g by mouth every morning.     furosemide (LASIX) 20 MG tablet Take 1 tablet (20 mg total) by mouth daily. May take an additional tablet in the afternoon as needed for weight gain of 2 lbs overnight or 5 lbs in one week. (Patient taking differently: Take 20 mg by mouth 2 (two) times daily. May take an additional tablet in the afternoon as needed for weight gain of 2 lbs overnight or 5 lbs in one week.) 45 tablet 5   glucosamine-chondroitin 500-400 MG tablet Take 1 tablet by mouth every morning.     mupirocin ointment (BACTROBAN) 2 % Place 1 Application into the nose daily as needed (irritation).     predniSONE (DELTASONE) 5 MG tablet TAKE 1 TABLET  DAILY WITH BREAKFAST 90 tablet 3   pregabalin (LYRICA) 75 MG capsule Take 1 capsule by mouth three times daily as needed (Patient taking differently: Take 75 mg by mouth daily as needed (pain).) 90 capsule 1   sacubitril-valsartan (ENTRESTO) 24-26 MG Take 1 tablet by mouth twice daily 180 tablet 2   triamcinolone cream (KENALOG) 0.1 % Apply 1 Application topically 3 (three) times daily. (Patient taking differently: Apply 1 Application topically daily as needed (irritation).) 450 g 1   warfarin (COUMADIN) 6 MG tablet TAKE ONE-HALF (1/2) TABLET DAILY EXCEPT TAKE 1 TABLET ON FRIDAYS OR AS  DIRECTED BY ANTICOAGULATION CLINIC 60 tablet 1   Cholecalciferol (VITAMIN D3) 125 MCG (5000 UT) CAPS Take 1 capsule (5,000 Units total) by mouth daily. (Patient not taking: Reported on 06/16/2023) 100 capsule 3   No current facility-administered medications for this encounter.    Atrial Fibrillation Management history:  Previous antiarrhythmic drugs: none Previous cardioversions: none Previous ablations: none Anticoagulation history: coumadin   ROS- All systems are reviewed and negative except as per the HPI above.  Physical Exam: BP 96/60   Pulse 98   Ht 5\' 8"  (1.727 m)   Wt 115.5 kg   BMI 38.71 kg/m   GEN: Well nourished, well developed in no acute distress NECK: No JVD; No carotid bruits CARDIAC: Regular rate (V paced), no murmurs, rubs, gallops RESPIRATORY:  Clear to auscultation without rales, wheezing or rhonchi  ABDOMEN: Soft, non-tender, non-distended EXTREMITIES:  No edema; No deformity   EKG today demonstrates  Vent. rate 98 BPM PR interval * ms QRS duration 140 ms QT/QTcB 382/487 ms P-R-T axes * -44 101 Sinus tachycardia with complete heart block and Ventricular-paced rhythm Abnormal ECG When compared with ECG of 14-May-2023 14:33, PREVIOUS ECG IS PRESENT  Echo 04/24/23 demonstrated  1. Left ventricular ejection fraction, by estimation, is 40 to 45%. The  left ventricle has mildly decreased function. The left ventricle  demonstrates global hypokinesis. Left ventricular diastolic function could  not be evaluated.   2. Right ventricular systolic function is normal. The right ventricular  size is normal. There is mildly elevated pulmonary artery systolic  pressure. The estimated right ventricular systolic pressure is 37.3 mmHg.   3. Left atrial size was moderately dilated.   4. Right atrial size was mildly dilated.   5. The mitral valve is normal in structure. Mild mitral valve  regurgitation.   6. The aortic valve has been repaired/replaced. Aortic valve   regurgitation is not visualized. There is a CoreValve-Evolut Pro  prosthetic (TAVR) valve present in the aortic position. Echo findings are  consistent with normal structure and function of the  aortic valve prosthesis. Aortic valve mean gradient measures 9.0 mmHg.  Aortic valve Vmax measures 2.07 m/s. Aortic valve acceleration time  measures 74 msec.   7. The inferior vena cava is normal in size with greater than 50%  respiratory variability, suggesting right atrial pressure of 3 mmHg.   ASSESSMENT & PLAN CHA2DS2-VASc Score = 3  The patient's score is based upon: CHF History: 1 HTN History: 1 Diabetes History: 0 Stroke History: 0 Vascular Disease History: 0 Age Score: 1 Gender Score: 0       ASSESSMENT AND PLAN: Longstanding Persistent Atrial Fibrillation (ICD10:  I48.11) The patient's CHA2DS2-VASc score is 3, indicating a 3.2% annual risk of stroke.    Patient presents for dofetilide admission. Continue coumadin as directed, 3 weekly INRs have been therapeutic. No recent benadryl use. PharmD has screened medications. QTc  in SR 441 ms (ECG in 2019)   Unfortunately, patient's INR today was subtherapeutic. Discussed with Dr. Jimmey Ralph and agreed that due to subtherapeutic INR patient is to reschedule Tikosyn admission.   Secondary Hypercoagulable State (ICD10:  D68.69) The patient is at significant risk for stroke/thromboembolism based upon his CHA2DS2-VASc Score of 3.  Continue Warfarin (Coumadin).    Patient notified of subtherapeutic INR and Tikosyn admission will have to be rescheduled. He declines to reschedule Tikosyn admission and does not want to do it. He will follow up with Dr. Jimmey Ralph as scheduled.    Lake Bells, PA-C  [facility] [address] [place], [place] [address] [phone]

## 2023-06-18 NOTE — Telephone Encounter (Signed)
Shona Simpson, RN to Me     06/18/23 10:22 AM Not sure if you can help explain the rationale behind why his INR may have fell below 2.0 at his check here for admission - Thanks! Stacy   Unsure why INR would drop below 2.0. Dosing has not changed.

## 2023-06-19 NOTE — Telephone Encounter (Signed)
spoke to patient, he found the reason why his INR fell below 2. He no longer have question regarding this matter.

## 2023-06-26 ENCOUNTER — Ambulatory Visit (INDEPENDENT_AMBULATORY_CARE_PROVIDER_SITE_OTHER): Payer: MEDICARE

## 2023-06-26 DIAGNOSIS — Z7901 Long term (current) use of anticoagulants: Secondary | ICD-10-CM

## 2023-06-26 LAB — POCT INR: INR: 2.1 (ref 2.0–3.0)

## 2023-06-26 NOTE — Patient Instructions (Addendum)
Pre visit review using our clinic review tool, if applicable. No additional management support is needed unless otherwise documented below in the visit note.  Continue 1/2 tablet daily except take 1 tablet on Fridays. Re-check in 4 weeks.

## 2023-06-26 NOTE — Progress Notes (Signed)
 Pt was scheduled for hospitalization for Tikosyn. Cardiology needs 4 weekly INR results in range. Last INR was performed the day pt was to be admitted and was 1.9, so they cancelled the admission. Continue 1/2 tablet daily except take 1 tablet on Fridays. Recheck in 4 weeks.

## 2023-07-03 ENCOUNTER — Other Ambulatory Visit: Payer: Self-pay | Admitting: Internal Medicine

## 2023-07-03 ENCOUNTER — Encounter: Payer: Self-pay | Admitting: Internal Medicine

## 2023-07-03 DIAGNOSIS — I5022 Chronic systolic (congestive) heart failure: Secondary | ICD-10-CM

## 2023-07-08 ENCOUNTER — Other Ambulatory Visit: Payer: Self-pay | Admitting: Internal Medicine

## 2023-07-08 MED ORDER — NORTRIPTYLINE HCL 25 MG PO CAPS: 25.0000 mg | ORAL_CAPSULE | Freq: Every day | ORAL | Status: DC

## 2023-07-10 ENCOUNTER — Ambulatory Visit (HOSPITAL_BASED_OUTPATIENT_CLINIC_OR_DEPARTMENT_OTHER): Payer: MEDICARE | Admitting: Cardiology

## 2023-07-10 ENCOUNTER — Encounter (HOSPITAL_BASED_OUTPATIENT_CLINIC_OR_DEPARTMENT_OTHER): Payer: Self-pay | Admitting: Cardiology

## 2023-07-10 VITALS — BP 115/71 | HR 93 | Ht 68.0 in | Wt 253.2 lb

## 2023-07-10 DIAGNOSIS — Z7901 Long term (current) use of anticoagulants: Secondary | ICD-10-CM

## 2023-07-10 DIAGNOSIS — I447 Left bundle-branch block, unspecified: Secondary | ICD-10-CM | POA: Diagnosis not present

## 2023-07-10 DIAGNOSIS — Z950 Presence of cardiac pacemaker: Secondary | ICD-10-CM | POA: Diagnosis not present

## 2023-07-10 DIAGNOSIS — I4811 Longstanding persistent atrial fibrillation: Secondary | ICD-10-CM

## 2023-07-10 DIAGNOSIS — I428 Other cardiomyopathies: Secondary | ICD-10-CM

## 2023-07-10 DIAGNOSIS — Z952 Presence of prosthetic heart valve: Secondary | ICD-10-CM

## 2023-07-10 DIAGNOSIS — D6869 Other thrombophilia: Secondary | ICD-10-CM | POA: Diagnosis not present

## 2023-07-10 NOTE — Patient Instructions (Signed)
Medication Instructions:  Your physician has recommended you make the following change in your medication:   STOP Entresto   *If you need a refill on your cardiac medications before your next appointment, please call your pharmacy*   Follow-Up: At [facility], you and your health needs are our priority.  As part of our continuing mission to provide you with exceptional heart care, we have created designated Provider Care Teams.  These Care Teams include your primary Cardiologist (physician) and Advanced Practice Providers (APPs -  Physician Assistants and Nurse Practitioners) who all work together to provide you with the care you need, when you need it.  We recommend signing up for the patient portal called "MyChart".  Sign up information is provided on this After Visit Summary.  MyChart is used to connect with patients for Virtual Visits (Telemedicine).  Patients are able to view lab/test results, encounter notes, upcoming appointments, etc.  Non-urgent messages can be sent to your provider as well.   To learn more about what you can do with MyChart, go to [email or web address].    Your next appointment:   6 month(s)  Please ensure appt is after echo appointment  Provider:   Jodelle Red, MD

## 2023-07-10 NOTE — Progress Notes (Signed)
Cardiology Office Note:  .    Date:  07/10/2023  ID:  [name], DOB 12/29/1950, MRN [id number] PCP: Tresa Garter, MD  [facility] Providers Cardiologist:  Jodelle Red, MD Electrophysiologist:  Nobie Putnam, MD     History of Present Illness: .    Robert [name] is a 73 y.o. 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  He was previously seen by cardiology in 04/2014 by Dr. Eden Emms. His initial consult with me was on 04/23/18. Please see that note for full review of his history.  CV history: (Pre-procedure) Cath 12/16/22 showed mid LAD with 40% stenosis, and 20% stenosis of Ost RCA to proximal RCA, mild obstructive CAD.  TAVR 01/06/2023 34 mm Medtronic FX THV via the TF approach BiV device placed 05/14/23 for LBBB   Today: Was able to cut and load wood yesterday. Has had fluctuating INR recently, which is not typical for him. Seen 12/24 for tikosyn admission but INR subtherapeutic. Recheck recently was normal. He no longer wants to pursue tikosyn. Did not feel that amiodarone helped him. He has concerns about medications and ablation, plans to discuss with Dr. Jimmey Ralph.  Stopped taking entresto due to voice hoarseness, facial swelling/tenderness, right eye blurring. Feels better off of this for the last two weeks and with restarting nortriptyline.  Discussed recheck of echo. Has follow up with Dr. Jimmey Ralph in February. Discussed we usually recheck echo 3 mos after device if pacing burden has been appropriate. Most recent interrogation shows BiV pacing at 88%, afib 100%. Also due for his TAVR f/u echo in 12/2023, would also be reasonable to wait until that time.  Working on lifestyle change and activity. Discussed gradually working to  increase activity. Discussed limitations/symptoms that he needs to monitor for.  No PND or orthopnea. Chronic shortness of breath, though feels that cardiac rehab helped him. Chronic LE edema.  Studies Reviewed: [name]         Physical Exam:    VS:  BP 115/71 (BP Location: Left Arm, Patient Position: Sitting)   Pulse 93   Ht 5\' 8"  (1.727 m)   Wt 253 lb 3.2 oz (114.9 kg)   SpO2 97%   BMI 38.50 kg/m    Wt Readings from Last 3 Encounters:  07/10/23 253 lb 3.2 oz (114.9 kg)  06/16/23 254 lb 9.6 oz (115.5 kg)  05/26/23 266 lb (120.7 kg)    GEN: Well nourished, well developed in no acute distress HEENT: Normal, moist mucous membranes NECK: No JVD CARDIAC: regular rhythm (paced), normal S1 and S2, no rubs or gallops. No murmur. VASCULAR: Radial and DP pulses 2+ bilaterally. No carotid bruits RESPIRATORY:  Clear to auscultation without rales, wheezing or rhonchi  ABDOMEN: Soft, non-tender, non-distended MUSCULOSKELETAL:  Ambulates independently SKIN: Warm and dry, chronic brawny mild LE edema NEUROLOGIC:  Alert and oriented x 3. No focal neuro deficits noted. PSYCHIATRIC:  Normal affect   ASSESSMENT AND PLAN: .    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 He would like to not add medication at this time. -cannot tolerate beta blockers -EF prior to TAVR 55-60% 10/2022, immediate post TAVR 50-55%,  dropped to 30-35% 1 month post TAVR, after GDMT was 40-45% 04/2023. Discussed timeline of follow up echo today   Permanent vs longstanding persistent atrial fibrillation -began during his Covid illness -given limitations/sensitivities to medications, limited options. Tolerated amiodarone as a rate control agent in the past, but had side effects of hand swelling and thyroid abnormalities. Cannot  tolerate diltiazem or beta blockers. -now followed by Dr. Jimmey Ralph. Planned for tikosyn admission 06/16/23, but INR subtherapeutic. Feels this was a sign, not interested in rescheduling this -using digoxin for rate control. This is not my first choice, but we have limited options -CHA2DS2/VAS Stroke Risk Points=4  -on long term coumadin. Not interested in DOAC. Son had reaction to apixaban. -we have discussion cardioversion in the past, declined. Wife's cousin died some time after cardioversion, they are not interested in this ever.   Hypertension:  -at goal today   History of DVT/PE with chronic venous insufficiency, on long term anticoagulation with coumadin:  -No changes, not interested in DOAC, continue coumadin therapy.    Dyslipidemia Nonobstructive CAD Peripheral vascular disease -We have discussed at length options for management. Declines statin and other medications. He prefers to stay on OTC fish oil. Has significantly elevated ASCVD risk, below. Discussed that OTC fish oil not recommended from a cardiovascular perspective.   Obesity: BMI ~44->38. Working on weight loss through lifestyle change.   CV risk counseling and prevention -recommend heart healthy/Mediterranean diet, with whole grains, fruits, vegetable, fish, lean meats, nuts, and olive oil. Limit salt. -recommend moderate walking, 3-5 times/week for 30-50 minutes each session. Aim for at least 150 minutes.week. Goal should be pace of 3 miles/hours, or walking 1.5 miles in 30 minutes -recommend avoidance of tobacco products. Avoid excess alcohol.  Dispo: Has appt with Dr. Jimmey Ralph next month. We will plan to follow up in 6 mos but he will contact me sooner if symptoms worsen.  Total time of encounter: I spent 46 minutes dedicated to the care of this patient on the date of this encounter to include pre-visit review of records, face-to-face time with the patient discussing conditions above, and clinical documentation with  the electronic health record. We specifically spent time today discussing his afib, cardiomyopathy, recommendations for medications and management, review of prior testing.   Jodelle Red, MD, PhD, [facility]  [facility]    Signed, Jodelle Red, MD

## 2023-07-15 ENCOUNTER — Ambulatory Visit (HOSPITAL_BASED_OUTPATIENT_CLINIC_OR_DEPARTMENT_OTHER): Payer: MEDICARE | Admitting: Cardiology

## 2023-07-20 ENCOUNTER — Encounter: Payer: Self-pay | Admitting: Internal Medicine

## 2023-07-24 ENCOUNTER — Ambulatory Visit (INDEPENDENT_AMBULATORY_CARE_PROVIDER_SITE_OTHER): Payer: MEDICARE

## 2023-07-24 ENCOUNTER — Other Ambulatory Visit: Payer: Self-pay

## 2023-07-24 DIAGNOSIS — Z7901 Long term (current) use of anticoagulants: Secondary | ICD-10-CM

## 2023-07-24 DIAGNOSIS — I5022 Chronic systolic (congestive) heart failure: Secondary | ICD-10-CM

## 2023-07-24 LAB — POCT INR: INR: 2.4 (ref 2.0–3.0)

## 2023-07-24 MED ORDER — TRIAMCINOLONE ACETONIDE 0.1 % EX CREA: 1.0000 | TOPICAL_CREAM | Freq: Three times a day (TID) | CUTANEOUS | 1 refills | Status: AC

## 2023-07-24 MED ORDER — FUROSEMIDE 20 MG PO TABS: 20.0000 mg | ORAL_TABLET | Freq: Every day | ORAL | 3 refills | Status: DC

## 2023-07-24 MED ORDER — ALLOPURINOL 100 MG PO TABS: 100.0000 mg | ORAL_TABLET | Freq: Every day | ORAL | 1 refills | Status: DC

## 2023-07-24 MED ORDER — COLCHICINE 0.6 MG PO TABS: ORAL_TABLET | ORAL | 0 refills | Status: AC

## 2023-07-24 MED ORDER — NORTRIPTYLINE HCL 25 MG PO CAPS: 25.0000 mg | ORAL_CAPSULE | Freq: Every day | ORAL | 3 refills | Status: DC

## 2023-07-24 MED ORDER — CETIRIZINE HCL 10 MG PO TABS: 10.0000 mg | ORAL_TABLET | Freq: Every day | ORAL | 3 refills | Status: AC

## 2023-07-24 MED ORDER — PREDNISONE 5 MG PO TABS: 5.0000 mg | ORAL_TABLET | Freq: Every day | ORAL | 3 refills | Status: DC

## 2023-07-24 MED ORDER — VITAMIN D3 125 MCG (5000 UT) PO CAPS: 1.0000 | ORAL_CAPSULE | Freq: Every day | ORAL | 3 refills | Status: AC

## 2023-07-24 NOTE — Patient Instructions (Addendum)
Pre visit review using our clinic review tool, if applicable. No additional management support is needed unless otherwise documented below in the visit note.  Continue 1/2 tablet daily except take 1 tablet on Fridays. Re-check in 4 weeks.

## 2023-07-24 NOTE — Progress Notes (Signed)
Continue 1/2 tablet daily except take 1 tablet on Fridays. Re-check in 4 weeks. 

## 2023-08-14 ENCOUNTER — Ambulatory Visit (INDEPENDENT_AMBULATORY_CARE_PROVIDER_SITE_OTHER): Payer: MEDICARE

## 2023-08-14 DIAGNOSIS — I428 Other cardiomyopathies: Secondary | ICD-10-CM | POA: Diagnosis not present

## 2023-08-14 LAB — CUP PACEART REMOTE DEVICE CHECK
Battery Remaining Longevity: 95 mo
Battery Voltage: 3.08 V
Brady Statistic RA Percent Paced: 0 %
Brady Statistic RV Percent Paced: 87.56 %
Date Time Interrogation Session: 20250220221012
Implantable Lead Connection Status: 753985
Implantable Lead Connection Status: 753985
Implantable Lead Connection Status: 753985
Implantable Lead Implant Date: 20241121
Implantable Lead Implant Date: 20241121
Implantable Lead Implant Date: 20241121
Implantable Lead Location: 753858
Implantable Lead Location: 753859
Implantable Lead Location: 753860
Implantable Lead Model: 3830
Implantable Lead Model: 4598
Implantable Lead Model: 5076
Implantable Pulse Generator Implant Date: 20241121
Lead Channel Impedance Value: 1064 Ohm
Lead Channel Impedance Value: 1463 Ohm
Lead Channel Impedance Value: 1482 Ohm
Lead Channel Impedance Value: 1501 Ohm
Lead Channel Impedance Value: 380 Ohm
Lead Channel Impedance Value: 437 Ohm
Lead Channel Impedance Value: 494 Ohm
Lead Channel Impedance Value: 532 Ohm
Lead Channel Impedance Value: 532 Ohm
Lead Channel Impedance Value: 551 Ohm
Lead Channel Impedance Value: 570 Ohm
Lead Channel Impedance Value: 570 Ohm
Lead Channel Impedance Value: 912 Ohm
Lead Channel Impedance Value: 931 Ohm
Lead Channel Pacing Threshold Amplitude: 0.625 V
Lead Channel Pacing Threshold Amplitude: 1.25 V
Lead Channel Pacing Threshold Pulse Width: 0.4 ms
Lead Channel Pacing Threshold Pulse Width: 1 ms
Lead Channel Sensing Intrinsic Amplitude: 16 mV
Lead Channel Sensing Intrinsic Amplitude: 16 mV
Lead Channel Sensing Intrinsic Amplitude: 2.25 mV
Lead Channel Sensing Intrinsic Amplitude: 2.25 mV
Lead Channel Setting Pacing Amplitude: 2 V
Lead Channel Setting Pacing Amplitude: 2.5 V
Lead Channel Setting Pacing Amplitude: 3.5 V
Lead Channel Setting Pacing Pulse Width: 0.4 ms
Lead Channel Setting Pacing Pulse Width: 1 ms
Lead Channel Setting Sensing Sensitivity: 1.2 mV
Zone Setting Status: 755011
Zone Setting Status: 755011

## 2023-08-18 ENCOUNTER — Ambulatory Visit (HOSPITAL_BASED_OUTPATIENT_CLINIC_OR_DEPARTMENT_OTHER): Payer: MEDICARE | Admitting: Student

## 2023-08-18 ENCOUNTER — Ambulatory Visit (HOSPITAL_BASED_OUTPATIENT_CLINIC_OR_DEPARTMENT_OTHER): Payer: MEDICARE

## 2023-08-18 DIAGNOSIS — M25551 Pain in right hip: Secondary | ICD-10-CM

## 2023-08-18 DIAGNOSIS — M5441 Lumbago with sciatica, right side: Secondary | ICD-10-CM

## 2023-08-18 DIAGNOSIS — G8929 Other chronic pain: Secondary | ICD-10-CM | POA: Diagnosis not present

## 2023-08-18 DIAGNOSIS — M51369 Other intervertebral disc degeneration, lumbar region without mention of lumbar back pain or lower extremity pain: Secondary | ICD-10-CM | POA: Diagnosis not present

## 2023-08-18 DIAGNOSIS — M160 Bilateral primary osteoarthritis of hip: Secondary | ICD-10-CM | POA: Diagnosis not present

## 2023-08-18 DIAGNOSIS — M48061 Spinal stenosis, lumbar region without neurogenic claudication: Secondary | ICD-10-CM | POA: Diagnosis not present

## 2023-08-18 DIAGNOSIS — M5126 Other intervertebral disc displacement, lumbar region: Secondary | ICD-10-CM | POA: Diagnosis not present

## 2023-08-18 DIAGNOSIS — M47816 Spondylosis without myelopathy or radiculopathy, lumbar region: Secondary | ICD-10-CM | POA: Diagnosis not present

## 2023-08-18 MED ORDER — OXYCODONE HCL 5 MG PO TABS: 5.0000 mg | ORAL_TABLET | ORAL | 0 refills | Status: AC | PRN

## 2023-08-18 NOTE — Progress Notes (Unsigned)
 Chief Complaint: Right groin pain     History of Present Illness:   Discussed the use of AI scribe software for clinical note transcription with the patient, who gave verbal consent to proceed.  Nicholas Trompeter Suh "Elijah Birk" is a 73 year old male with a history of back surgery and blood clots who presents with right leg pain and numbness. He experiences sharp and stinging pain in his right leg, which began after helping to repair a fence on a muddy slope approximately 9 days ago. The pain has worsened over time, involving the hip and groin, with numbness extending from the hip to the knee. He has a history of similar episodes occurring every two to three years, for which he has used Lyrica with some relief. He has been using lidocaine patches for pain relief. He has tried Flexeril and tramadol in the past without significant relief. He also reports difficulty sleeping due to pain, often sleeping in a chair to avoid discomfort.  He has a history of back surgery and fusion in 2003, which was complicated by blood clots in the femoral artery and a saddle embolism. Since then, he has experienced numbness from the waist down. He also has a history of hip and knee issues, including a hip replacement in 2014, which was complicated by MRSA infection. He has experienced significant weight loss, approximately 80 pounds since July 2024, through exercise. He is currently under the care of multiple specialists for his various conditions.  Surgical History:   L5-S1 Lumbar fusion 2003  PMH/PSH/Family History/Social History/Meds/Allergies:    Past Medical History:  Diagnosis Date   Anxiety    Asthma    "no attack since acupuncture in 1990's"   Atrial fibrillation (HCC)    Complication of anesthesia    SLOW TO WAKE UP / DIFFICULTY RESPONDING 2003, 3 days before could use left arm, "wild/crazy sometimes"   COPD (chronic obstructive pulmonary disease) (HCC)    Cyst of left kidney     "benign"   DVT (deep venous thrombosis) (HCC) 06/23/2001   right femoral artery "from groin to knee"   Edema    Right Leg w/ h/o DVT postphlebitic   H/O blood transfusion reaction 06/23/2002   FFP, extreme swelling, could not breath   History of pulmonary embolism 06/23/2001   both lungs   Hyperlipidemia    Knee pain    left   LBP (low back pain)    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 Lynnette Caffey and Dr. Leafy Ro   Severe aortic stenosis    Spondylolisthesis    Warfarin anticoagulation    Wheezing    when laying on left side   Past Surgical History:  Procedure Laterality Date   APPENDECTOMY  06/24/1991   Dr Jamey Ripa   BIV PACEMAKER INSERTION CRT-P N/A 05/14/2023   Procedure: BIV PACEMAKER INSERTION CRT-P;  Surgeon: Nobie Putnam, MD;  Location: [facility] INVASIVE CV LAB;  Service: Cardiovascular;  Laterality: N/A;   CATARACT EXTRACTION W/ INTRAOCULAR LENS IMPLANT Bilateral    May/june 2024  CHOLECYSTECTOMY  06/24/1991   FINGER SURGERY  06/23/2010   RT THUMB RECONSTRUCTION   HAND SURGERY Right    abcess   HEMORROIDECTOMY  06/24/1979   I & D KNEE WITH POLY EXCHANGE Left 04/04/2013   Procedure: IRRIGATION AND DEBRIDEMENT KNEE WITH POLY EXCHANGE AND INSERTION OF CEMENT BEADS;  Surgeon: Loanne Drilling, MD;  Location: [facility];  Service: Orthopedics;  Laterality: Left;   INTRAOPERATIVE TRANSTHORACIC ECHOCARDIOGRAM N/A 01/06/2023   Procedure: INTRAOPERATIVE TRANSTHORACIC ECHOCARDIOGRAM;  Surgeon: [name], MD;  Location: [facility] INVASIVE CV LAB;  Service: Open Heart Surgery;  Laterality: N/A;   KNEE ARTHROSCOPY  06/23/1988   RT KNEE   KNEE ARTHROSCOPY Right 06/23/2001   KNEE ARTHROSCOPY  06/23/1986   L KNEE   KNEE ARTHROSCOPY WITH MENISCAL  REPAIR Left 10/22/2012   Procedure: LEFT KNEE ARTHROSCOPY PARTIAL MEDIAL AND LATERAL MENISCECTOMY AND DEBRIDEMENT, CHONDROPLASTY ;  Surgeon: Javier Docker, MD;  Location: [facility];  Service: Orthopedics;  Laterality: Left;   LUMBAR DISC SURGERY     LUMBAR FUSION  06/23/2001   Dr Jillyn Hidden   ORCHIECTOMY  06/23/1992   R post injury   RIGHT HEART CATH AND CORONARY ANGIOGRAPHY N/A 12/16/2022   Procedure: RIGHT HEART CATH AND CORONARY ANGIOGRAPHY;  Surgeon: [name], MD;  Location: [facility] INVAS[facility] CV LAB;  Service: Cardiovascular;  Laterality: N/A;   TONSILLECTOMY  06/23/1965   TOTAL KNEE ARTHROPLASTY Left 03/21/2013   Procedure: LEFT TOTAL KNEE ARTHROPLASTY;  Surgeon: Loanne Drilling, MD;  Location: [facility];  Service: Orthopedics;  Laterality: Left;   TRANSCATHETER AORTIC VALVE REPLACEMENT, TRANSFEMORAL Right 01/06/2023   Procedure: Transcatheter Aortic Valve Replacement, Transfemoral;  Surgeon: [name], MD;  Location: [facility] INVASIVE CV LAB;  Service: Open Heart Surgery;  Laterality: Right;   Social History   Socioeconomic History   Marital status: Married    Spouse name: Not on file   Number of children: 3   Years of education: Not on file   Highest education level: Bachelor's degree (e.g., BA, AB, BS)  Occupational History   Occupation: Disabled    Employer: DISABLED  Tobacco Use   Smoking status: Never   Smokeless tobacco: Never  Vaping Use   Vaping status: Never Used  Substance and Sexual Activity   Alcohol use: Never   Drug use: Never   Sexual activity: Yes  Other Topics Concern   Not on file  Social History Narrative   Right handed   One story home   Drinks caffeine coffee q am   Social Drivers of Health   Financial Resource Strain: Patient Declined (04/17/2023)   Overall Financial Resource Strain (CARDIA)    Difficulty of Paying Living Expenses: Patient declined  Food Insecurity: Patient Declined (04/17/2023)   Hunger Vital Sign    Worried About Running Out of  Food in the Last Year: Patient declined    Ran Out of Food in the Last Year: Patient declined  Transportation Needs: No Transportation Needs (04/17/2023)   PRAPARE - Administrator, Civil Service (Medical): No    Lack of Transportation (Non-Medical): No  Physical Activity: Sufficiently Active (04/17/2023)   Exercise Vital Sign    Days of Exercise per Week: 5 days    Minutes of Exercise per Session: 150+ min  Stress: Stress Concern Present (04/17/2023)   [employer] of Occupational Health - Occupational Stress Questionnaire    Feeling of Stress : To some extent  Social Connections: Unknown (04/17/2023)   Social  Connection and Isolation Panel [NHANES]    Frequency of Communication with Friends and Family: Twice a week    Frequency of Social Gatherings with Friends and Family: Patient declined    Attends Religious Services: Patient declined    Database administrator or Organizations: Patient declined    Attends Engineer, structural: Patient declined    Marital Status: Married   Family History  Problem Relation Age of Onset   Heart disease Mother        CABG at age 14   Hyperlipidemia Other    Hypertension Other    Parkinsonism Other    Heart disease Sister    Allergies  Allergen Reactions   Cardizem [Diltiazem] Anaphylaxis   Lopressor [Metoprolol Tartrate] Anaphylaxis    Asthmatic issues    Sulfa Antibiotics Hives and Swelling   Amiodarone Swelling    Hand swelling   Anoro Ellipta [Umeclidinium-Vilanterol]     Too much mucus   Breo Ellipta [Fluticasone Furoate-Vilanterol]     Too much mucus   Buspar [Buspirone]     Red rash   Entresto [Sacubitril-Valsartan] Swelling   Exalgo [Hydromorphone Hcl] Nausea Only    Sick and nauseated   Gabapentin     Made pt feel out of it    Hydrocodone-Acetaminophen Nausea Only   Penicillins Hives    Tolerated Cefazolin 04/07/13, S.Andrey Campanile, PharmD   Red Dye     Welps and rashes   Sulfonamide Derivatives Hives  and Swelling   Tylenol [Acetaminophen]     Liver issues   Cat Dander Itching   Current Outpatient Medications  Medication Sig Dispense Refill   albuterol (VENTOLIN HFA) 108 (90 Base) MCG/ACT inhaler Inhale 2 puffs into the lungs every 6 (six) hours as needed for wheezing or shortness of breath. 6.7 g 0   allopurinol (ZYLOPRIM) 100 MG tablet Take 1 tablet (100 mg total) by mouth daily. 90 tablet 1   celecoxib (CELEBREX) 200 MG capsule Take 1 capsule (200 mg total) by mouth 2 (two) times daily. (Patient taking differently: Take 200 mg by mouth as needed for moderate pain (pain score 4-6).) 14 capsule 0   cetirizine (ZYRTEC) 10 MG tablet Take 1 tablet (10 mg total) by mouth daily. 90 tablet 3   Cholecalciferol (VITAMIN D3) 125 MCG (5000 UT) CAPS Take 1 capsule (5,000 Units total) by mouth daily. 30 capsule 3   colchicine 0.6 MG tablet TAKE 2 TABLETS BY MOUTH AS NEEDED FOR  GOUT  ATTACK  THEN  TAKE  ANOTHER  ONE  TABLET  IN  1  TO  2  HOURS.  DO  NOT  REPEAT  FOR  3  DAYS. 18 tablet 0   digoxin (LANOXIN) 0.125 MG tablet Take 1 tablet (0.125 mg total) by mouth daily. 90 tablet 3   doxycycline (MONODOX) 100 MG capsule Take 1 capsule (100 mg total) by mouth as directed. 1 hour before any dental work, including cleanings. 6 capsule 6   famotidine (PEPCID) 40 MG tablet Take 1 tablet by mouth once daily (Patient taking differently: Take 40 mg by mouth daily. Only when he takes Celebrex) 30 tablet 5   faricimab-svoa (VABYSMO) 6 MG/0.05ML SOLN intravitreal injection 6 mg by Intravitreal route once. Injections every 9 weeks     fish oil-omega-3 fatty acids 1000 MG capsule Take 1 g by mouth every morning.     furosemide (LASIX) 20 MG tablet Take 1 tablet (20 mg total) by mouth daily. 90 tablet 3  glucosamine-chondroitin 500-400 MG tablet Take 1 tablet by mouth every morning.     mupirocin ointment (BACTROBAN) 2 % Place 1 Application into the nose daily as needed (irritation).     nortriptyline (PAMELOR) 25  MG capsule Take 1 capsule (25 mg total) by mouth at bedtime. 90 capsule 3   predniSONE (DELTASONE) 5 MG tablet Take 1 tablet (5 mg total) by mouth daily with breakfast. 90 tablet 3   pregabalin (LYRICA) 75 MG capsule Take 1 capsule by mouth three times daily as needed (Patient taking differently: Take 75 mg by mouth daily as needed (pain).) 90 capsule 1   triamcinolone cream (KENALOG) 0.1 % Apply 1 Application topically 3 (three) times daily. 450 g 1   warfarin (COUMADIN) 6 MG tablet TAKE ONE-HALF (1/2) TABLET DAILY EXCEPT TAKE 1 TABLET ON FRIDAYS OR AS DIRECTED BY ANTICOAGULATION CLINIC 60 tablet 1   No current facility-administered medications for this visit.   No results found.  Review of Systems:   A ROS was performed including pertinent positives and negatives as documented in the HPI.  Physical Exam :   Constitutional: NAD and appears stated age Neurological: Alert and oriented Psych: Appropriate affect and cooperative There were no vitals taken for this visit.   Comprehensive Musculoskeletal Exam:    Tenderness and tension throughout the right sided lumbar paraspinal musculature without midline tenderness.  Passive right hip range of motion to 110 degrees flexion, 30 degrees external rotation, and 20 degrees internal rotation.  Negative straight leg raise.  Knee flexion/extension and ankle dorsiflexion/plantarflexion strength 5/5.  Imaging:   Xray (lumbar spine 4 views): Prior L5-S1 spinal fusion.  Borderline grade 1-2 spondylolisthesis of L4 on L5.  Diffuse spondylosis with most significant disc space loss between L3-L4.  Xray (AP pelvis, right hip views): Mild to moderate hip osteoarthritis bilaterally with presence of cam lesions.   I personally reviewed and interpreted the radiographs.   Assessment:   73 y.o. male with pain in the right groin area rating down the right thigh.  He does have extensive lumbar history including an L5-S1 fusion.  Pain pattern does also travel  down the lateral side so I do believe there is likely involvement of his lumbar spine although x-rays today do show evidence of moderate right hip arthritis.  Discussed treatment options including physical therapy for his low back as well as a diagnostic intra-articular hip injection.  Patient would like to consider these but will hold off today.  He has multiple allergies/intolerances to medications so options for pain control are limited.  Will send him in a short course of oxycodone 5 mg but discussed that I will not be able to refill this.  He will continue to monitor symptoms and will let me know if you would like to proceed with PT or injection.  Plan :    -Short-term prescription for oxycodone 5 mg given today -Consider diagnostic intra-articular hip injection -Return to clinic as needed     I personally saw and evaluated the patient, and participated in the management and treatment plan.  Hazle Nordmann, PA-C Orthopedics

## 2023-08-20 DIAGNOSIS — H34831 Tributary (branch) retinal vein occlusion, right eye, with macular edema: Secondary | ICD-10-CM | POA: Diagnosis not present

## 2023-08-20 NOTE — Progress Notes (Signed)
 Electrophysiology Office Note:   Date:  08/22/2023  ID:  Robert [name], DOB Jun 11, 1951, MRN [id number]  Primary Cardiologist: Jodelle Red, MD Electrophysiologist: Nobie Putnam, MD      History of Present Illness:   Robert [name] is a 73 y.o. male with h/o severe AS s/p TAVR 01/06/2023 c/b development of LBBB with drop in LVEF, chronic systolic heart failure, severe Covid infection requiring prolonged hospitalization, atrial fibrillation, prior DVT/PE, dyslipidemia, obesity, paroxysmal SVT and HTN who is being seen today for Electrophysiology follow up.   Discussed the use of AI scribe software for clinical note transcription with the patient, who gave verbal consent to proceed.  History of Present Illness   The patient, with a history of AFib and TAVR, presents with a general feeling of unwellness and discomfort. He reports no recent episodes of phrenic capture, a symptom he had experienced shortly after pacemaker implantation. He does, however, describe a sensation "like something running across" his chest when his AFib worsens at night. The patient also reports difficulty sleeping due to his leg discomfort. The patient's leg issue began after an incident where he had to exert significant physical effort to repair a fallen fence. He describes the sensation in his leg as if it's being "shredded" when he stretches out in bed. This discomfort has significantly limited his mobility and ability to exercise, which he believes is contributing to his overall feeling of unwellness. The patient also mentions a persistent issue with his voice, which he attributes to a medication he was previously taking [name]). He reports feeling "dizzy" while on this medication and has since stopped taking it. He has not noticed any improvement in his voice since discontinuing the medication.     Review of systems complete and found to be negative unless listed in HPI.   EP Information / Studies  Reviewed:    EKG is ordered today. Personal review as below. AF with BiV paced morphology  Echo 02/16/23:  Moderately decreased LV function, LVEF 30 to 35%.  Global hypokinesis. Mildly reduced RV size and systolic function.  Echo 04/03/23:  Mildly decreased LV function, LVEF 40 to 45%. Normal RV size and function. Moderately dilated left atrium. Right atrial mildly dilated. Status post TAVR.  No significant valvular disease.  Risk Assessment/Calculations:    CHA2DS2-VASc Score = 3   This indicates a 3.2% annual risk of stroke. The patient's score is based upon: CHF History: 1 HTN History: 1 Diabetes History: 0 Stroke History: 0 Vascular Disease History: 0 Age Score: 1 Gender Score: 0             Physical Exam:   VS:  BP 128/80   Pulse 85   Ht 5\' 8"  (1.727 m)   Wt 243 lb (110.2 kg)   BMI 36.95 kg/m    Wt Readings from Last 3 Encounters:  08/21/23 243 lb (110.2 kg)  07/10/23 253 lb 3.2 oz (114.9 kg)  06/16/23 254 lb 9.6 oz (115.5 kg)     GEN: Well nourished, well developed in no acute distress NECK: No JVD sitting upright CARDIAC: Normal rate, regular rhythm RESPIRATORY:  Clear to auscultation without rales, wheezing or rhonchi  ABDOMEN: Soft, non-distended EXTREMITIES:  1+ edema; No deformity   ASSESSMENT AND PLAN:   Robert [name] is a 73 y.o. male with h/o severe AS s/p TAVR 01/06/2023 c/b development of LBBB with drop in LVEF, chronic systolic heart failure, severe Covid infection requiring prolonged hospitalization, atrial fibrillation, prior DVT/PE, dyslipidemia, obesity,  paroxysmal SVT and HTN who is being seen today for Electrophysiology follow up.   His LV ejection fraction pre-TAVR was reported as 50 to 55%.  Post TAVR he developed a new left bundle branch block and 1 month later his LVEF was reported as 30 to 35%.  He has clear dyssynchrony on his most recent echo which did not appear to be as pronounced prior to development of left bundle branch  block.  I suspected there was a component of left bundle branch mediated cardiomyopathy. His symptoms are consistent with heart failure and seem to correlate with the decline in his LVEF.  Following the drop in his LVEF he was started on GDMT and echocardiogram was repeated with EF most recently reported at 40 to 45%. He underwent CRT-P implant on 05/14/23.  #. Chronic systolic heart failure: He has a drop in his LVEF after developing a new left bundle branch block post TAVR.  He has dyssynchrony on his echocardiogram.   #. LBBB #. S/p CRT-P implant on 05/14/23: -In clinic device interrogation performed, appropriate function and stable lead parameters. 100% AF. BiV pacing only 88%, likely due to AF. Unfortunately, he is unable to tolerate any additional rate control medications which would help to increase BiV pacing. He is not interested in AAD therapy with Tikosyn. AV node ablation is likely the best option to increase BiV pacing percentage, if needed.  -We will repeat echocardiogram to reassess LVEF after CRT-P implant.   #. Longstanding persistent atrial fibrillation:  -AF is likely reducing BiV pacing percentage. He cannot tolerate any rate control medications - metoprolol (side effects), diltiazem (HF), amiodarone (side effects). He is currnetly on digoxin but intermittently has RVR which decreases BiV pacing. We have discussed AF treatment options on multiple occasions. He was set up for Tikosyn loading but INR was subtherapeutic and he is no longer interested in this. I would like proof that he is able to maintain sinus rhythm before considering AF ablation. There is no evidence that he has been in sinus for several years. We discussed AV node ablation to increase BiV pacing percentage. We will make this decision based on results of his echocardiogram.  - Continue digoxin.  #. Secondary hypercoagulable state due to atrial fibrillation: - Continue Warfarin.   #. Hypertension -At goal today.   Recommend checking blood pressures 1-2 times per week at home and recording the values.  Recommend bringing these recordings to the primary care physician.  Signed, Nobie Putnam, MD

## 2023-08-21 ENCOUNTER — Ambulatory Visit: Payer: MEDICARE | Attending: Cardiology | Admitting: Cardiology

## 2023-08-21 ENCOUNTER — Ambulatory Visit: Payer: MEDICARE

## 2023-08-21 ENCOUNTER — Encounter: Payer: Self-pay | Admitting: Cardiology

## 2023-08-21 VITALS — BP 128/80 | HR 85 | Ht 68.0 in | Wt 243.0 lb

## 2023-08-21 DIAGNOSIS — I447 Left bundle-branch block, unspecified: Secondary | ICD-10-CM | POA: Insufficient documentation

## 2023-08-21 DIAGNOSIS — I10 Essential (primary) hypertension: Secondary | ICD-10-CM | POA: Insufficient documentation

## 2023-08-21 DIAGNOSIS — D6869 Other thrombophilia: Secondary | ICD-10-CM | POA: Insufficient documentation

## 2023-08-21 DIAGNOSIS — Z7901 Long term (current) use of anticoagulants: Secondary | ICD-10-CM

## 2023-08-21 DIAGNOSIS — I4811 Longstanding persistent atrial fibrillation: Secondary | ICD-10-CM | POA: Insufficient documentation

## 2023-08-21 DIAGNOSIS — I5022 Chronic systolic (congestive) heart failure: Secondary | ICD-10-CM | POA: Insufficient documentation

## 2023-08-21 DIAGNOSIS — I471 Supraventricular tachycardia, unspecified: Secondary | ICD-10-CM

## 2023-08-21 LAB — POCT INR: INR: 2 (ref 2.0–3.0)

## 2023-08-21 NOTE — Progress Notes (Addendum)
 Pt reports a fall on a muddy slope about 2 weeks ago and reported right hip/leg pain. He had apt with ortho on 2/25 and they reported it could be lumbar pain. They prescribed a short course of oxycodone and recommended PT. Pt did not accept the PT.  Pt reports pain in the front of right thigh, and he states "where the femoral vein is and it feels like a clot". Advised pt he needs to go to ER to be assessed. Pt refused due to fear of "picking something up". He said he tried to get an apt with PCP but there are no openings until 3/25. Made apt with PCP for 3/12 but advised again he should go to ER if he thinks he has a clot. Pt denies any swelling, redness or pain. Advised pt he could go to one of the smaller ER locations so there would be fewer pts there. Gave info concerning other locations for ER, such as the [facility] location. Pt said he is not going but will if he has any swelling. Advised to f/u with office if any new s/s. Pt verbalized understanding.    Medical screening examination/treatment/procedure(s) were performed by non-physician practitioner and as supervising physician I was immediately available for consultation/collaboration.  I agree with above. Jacinta Shoe, MD

## 2023-08-21 NOTE — Patient Instructions (Signed)
 Medication Instructions:  Your physician recommends that you continue on your current medications as directed. Please refer to the Current Medication list given to you today.  *If you need a refill on your cardiac medications before your next appointment, please call your pharmacy*  Testing/Procedures: Echocardiogram  Your physician has requested that you have an echocardiogram. Echocardiography is a painless test that uses sound waves to create images of your heart. It provides your doctor with information about the size and shape of your heart and how well your heart's chambers and valves are working. This procedure takes approximately one hour. There are no restrictions for this procedure. Please do NOT wear cologne, perfume, aftershave, or lotions (deodorant is allowed). Please arrive 15 minutes prior to your appointment time.  Please note: We ask at that you not bring children with you during ultrasound (echo/ vascular) testing. Due to room size and safety concerns, children are not allowed in the ultrasound rooms during exams. Our front office staff cannot provide observation of children in our lobby area while testing is being conducted. An adult accompanying a patient to their appointment will only be allowed in the ultrasound room at the discretion of the ultrasound technician under special circumstances. We apologize for any inconvenience.  Follow-Up: At [facility], you and your health needs are our priority.  As part of our continuing mission to provide you with exceptional heart care, we have created designated Provider Care Teams.  These Care Teams include your primary Cardiologist (physician) and Advanced Practice Providers (APPs -  Physician Assistants and Nurse Practitioners) who all work together to provide you with the care you need, when you need it.  Your next appointment:   3 months  Provider:   You will see one of the following Advanced Practice Providers on your  designated Care Team:   Francis Dowse, Charlott Holler [address] [place], [place] Sherie Don, NP Canary Brim, NP

## 2023-08-21 NOTE — Patient Instructions (Addendum)
Pre visit review using our clinic review tool, if applicable. No additional management support is needed unless otherwise documented below in the visit note.  Continue 1/2 tablet daily except take 1 tablet on Fridays. Re-check in 4 weeks.

## 2023-08-26 ENCOUNTER — Telehealth: Payer: Self-pay | Admitting: Internal Medicine

## 2023-08-26 ENCOUNTER — Ambulatory Visit: Payer: MEDICARE | Admitting: Internal Medicine

## 2023-08-26 ENCOUNTER — Encounter: Payer: Self-pay | Admitting: Internal Medicine

## 2023-08-26 VITALS — BP 118/78 | HR 87 | Temp 98.4°F | Ht 68.0 in | Wt 243.0 lb

## 2023-08-26 DIAGNOSIS — Z7901 Long term (current) use of anticoagulants: Secondary | ICD-10-CM | POA: Diagnosis not present

## 2023-08-26 DIAGNOSIS — M79604 Pain in right leg: Secondary | ICD-10-CM

## 2023-08-26 DIAGNOSIS — Z86718 Personal history of other venous thrombosis and embolism: Secondary | ICD-10-CM

## 2023-08-26 DIAGNOSIS — I825Y3 Chronic embolism and thrombosis of unspecified deep veins of proximal lower extremity, bilateral: Secondary | ICD-10-CM | POA: Diagnosis not present

## 2023-08-26 MED ORDER — OXYCODONE HCL 10 MG PO TABS: 10.0000 mg | ORAL_TABLET | Freq: Four times a day (QID) | ORAL | 0 refills | Status: DC | PRN

## 2023-08-26 NOTE — Assessment & Plan Note (Signed)
 On Coumadin

## 2023-08-26 NOTE — Patient Instructions (Signed)
 Arnica gel

## 2023-08-26 NOTE — Addendum Note (Signed)
 Addended by: Tresa Garter on: 08/26/2023 03:29 PM   Modules accepted: Level of Service

## 2023-08-26 NOTE — Assessment & Plan Note (Signed)
 Cont w/Coumadin Compr socks

## 2023-08-26 NOTE — Assessment & Plan Note (Signed)
 New Sprain vs DVT Oxy prn  Potential benefits of a short or long term opioids use as well as potential risks (i.e. addiction risk, apnea etc) and complications (i.e. Somnolence, constipation and others) were explained to the patient and were aknowledged. Ven Duplex ordered Heat Elevate Cont w/Coumadin Compr socks

## 2023-08-26 NOTE — Telephone Encounter (Signed)
 Copied from CRM [id number]. Topic: Clinical - Prescription Issue >> Aug 26, 2023  4:16 PM Denese Killings wrote: Reason for CRM: Robert [name] with [facility] is needing clarification on Oxycodone HCl 10 MG TABS. She states that she seen that patient was prescribed Oxycodone 5mg  in the past and wants to know if he patient has a new diagnosis since the dosage increased.

## 2023-08-26 NOTE — Progress Notes (Signed)
 Subjective:  Patient ID: Robert [name], male    DOB: [birth year]  Age: 73 y.o. MRN: [id number]  CC: Hip Pain (Pt states he is having hip/groin pain... Pt believes he pulled a muscle in rt thigh... Pt states the pain is in his rt thigh, groin and outer/inner thigh area as well. Pt states he feels its nerve and muscle pain in the leg. Some warmth, discoloration and swelling in calf and lower rt leg.)   HPI Robert [name] presents for R leg pain  and swelling in the thigh and lower leg starting on Feb 9-10th - he helped to lift a fallen fence sections.  Pain started a day after..  Pain is 10/10  Outpatient Medications Prior to Visit  Medication Sig Dispense Refill   albuterol (VENTOLIN HFA) 108 (90 Base) MCG/ACT inhaler Inhale 2 puffs into the lungs every 6 (six) hours as needed for wheezing or shortness of breath. 6.7 g 0   allopurinol (ZYLOPRIM) 100 MG tablet Take 1 tablet (100 mg total) by mouth daily. 90 tablet 1   celecoxib (CELEBREX) 200 MG capsule Take 1 capsule (200 mg total) by mouth 2 (two) times daily. (Patient taking differently: Take 200 mg by mouth as needed for moderate pain (pain score 4-6).) 14 capsule 0   cetirizine (ZYRTEC) 10 MG tablet Take 1 tablet (10 mg total) by mouth daily. 90 tablet 3   Cholecalciferol (VITAMIN D3) 125 MCG (5000 UT) CAPS Take 1 capsule (5,000 Units total) by mouth daily. 30 capsule 3   colchicine 0.6 MG tablet TAKE 2 TABLETS BY MOUTH AS NEEDED FOR  GOUT  ATTACK  THEN  TAKE  ANOTHER  ONE  TABLET  IN  1  TO  2  HOURS.  DO  NOT  REPEAT  FOR  3  DAYS. 18 tablet 0   digoxin (LANOXIN) 0.125 MG tablet Take 1 tablet (0.125 mg total) by mouth daily. 90 tablet 3   doxycycline (MONODOX) 100 MG capsule Take 1 capsule (100 mg total) by mouth as directed. 1 hour before any dental work, including cleanings. 6 capsule 6   famotidine (PEPCID) 40 MG tablet Take 1 tablet by mouth once daily (Patient taking differently: Take 40 mg by mouth daily. Only when he takes  Celebrex) 30 tablet 5   faricimab-svoa (VABYSMO) 6 MG/0.05ML SOLN intravitreal injection 6 mg by Intravitreal route once. Injections every 9 weeks     fish oil-omega-3 fatty acids 1000 MG capsule Take 1 g by mouth every morning.     furosemide (LASIX) 20 MG tablet Take 1 tablet (20 mg total) by mouth daily. 90 tablet 3   glucosamine-chondroitin 500-400 MG tablet Take 1 tablet by mouth every morning.     mupirocin ointment (BACTROBAN) 2 % Place 1 Application into the nose daily as needed (irritation).     nortriptyline (PAMELOR) 25 MG capsule Take 1 capsule (25 mg total) by mouth at bedtime. 90 capsule 3   predniSONE (DELTASONE) 5 MG tablet Take 1 tablet (5 mg total) by mouth daily with breakfast. 90 tablet 3   pregabalin (LYRICA) 75 MG capsule Take 1 capsule by mouth three times daily as needed (Patient taking differently: Take 75 mg by mouth daily as needed (pain).) 90 capsule 1   triamcinolone cream (KENALOG) 0.1 % Apply 1 Application topically 3 (three) times daily. 450 g 1   warfarin (COUMADIN) 6 MG tablet TAKE ONE-HALF (1/2) TABLET DAILY EXCEPT TAKE 1 TABLET ON FRIDAYS OR AS DIRECTED BY ANTICOAGULATION  CLINIC 60 tablet 1   No facility-administered medications prior to visit.    ROS: Review of Systems  Constitutional:  Negative for appetite change, fatigue and unexpected weight change.  HENT:  Negative for congestion, nosebleeds, sneezing, sore throat and trouble swallowing.   Eyes:  Negative for itching and visual disturbance.  Respiratory:  Negative for cough.   Cardiovascular:  Negative for chest pain, palpitations and leg swelling.  Gastrointestinal:  Negative for abdominal distention, blood in stool, diarrhea and nausea.  Genitourinary:  Negative for frequency and hematuria.  Musculoskeletal:  Positive for arthralgias and back pain. Negative for gait problem, joint swelling and neck pain.  Skin:  Positive for color change. Negative for rash.  Neurological:  Negative for dizziness,  tremors, speech difficulty and weakness.  Hematological:  Bruises/bleeds easily.  Psychiatric/Behavioral:  Negative for agitation, behavioral problems, dysphoric mood and sleep disturbance. The patient is not nervous/anxious.     Objective:  BP 118/78   Pulse 87   Temp 98.4 F (36.9 C) (Oral)   Ht 5\' 8"  (1.727 m)   Wt 243 lb (110.2 kg)   SpO2 97%   BMI 36.95 kg/m   BP Readings from Last 3 Encounters:  08/26/23 118/78  08/21/23 128/80  07/10/23 115/71    Wt Readings from Last 3 Encounters:  08/26/23 243 lb (110.2 kg)  08/21/23 243 lb (110.2 kg)  07/10/23 253 lb 3.2 oz (114.9 kg)    Physical Exam Constitutional:      General: He is not in acute distress.    Appearance: He is well-developed. He is obese. He is not diaphoretic.     Comments: NAD  HENT:     Head: Normocephalic and atraumatic.     Right Ear: External ear normal.     Left Ear: External ear normal.     Nose: Nose normal.     Mouth/Throat:     Pharynx: No oropharyngeal exudate.  Eyes:     General: No scleral icterus.       Right eye: No discharge.        Left eye: No discharge.     Conjunctiva/sclera: Conjunctivae normal.     Pupils: Pupils are equal, round, and reactive to light.  Neck:     Thyroid: No thyromegaly.     Vascular: No JVD.     Trachea: No tracheal deviation.  Cardiovascular:     Rate and Rhythm: Normal rate and regular rhythm.     Heart sounds: Normal heart sounds. No murmur heard.    No friction rub. No gallop.  Pulmonary:     Effort: Pulmonary effort is normal. No respiratory distress.     Breath sounds: Normal breath sounds. No stridor. No wheezing or rales.  Chest:     Chest wall: No tenderness.  Abdominal:     General: Bowel sounds are normal. There is no distension.     Palpations: Abdomen is soft. There is no mass.     Tenderness: There is no abdominal tenderness. There is no guarding or rebound.  Genitourinary:    Penis: Normal. No tenderness.      Prostate: Normal.      Rectum: Normal. Guaiac result negative.  Musculoskeletal:        General: Swelling and tenderness present. No signs of injury. Normal range of motion.     Cervical back: Normal range of motion and neck supple.     Right lower leg: Edema present.     Left lower leg: No  edema.  Lymphadenopathy:     Cervical: No cervical adenopathy.  Skin:    General: Skin is warm and dry.     Coloration: Skin is not pale.     Findings: No erythema or rash.  Neurological:     Mental Status: He is alert and oriented to person, place, and time.     Cranial Nerves: No cranial nerve deficit.     Motor: No abnormal muscle tone.     Coordination: Coordination normal.     Gait: Gait normal.     Deep Tendon Reflexes: Reflexes are normal and symmetric. Reflexes normal.  Psychiatric:        Behavior: Behavior normal.        Thought Content: Thought content normal.        Judgment: Judgment normal.   R calf w/edema. Purple discoloration R shin  Lab Results  Component Value Date   WBC 8.1 04/20/2023   HGB 17.1 (H) 04/20/2023   HCT 50.6 04/20/2023   PLT 246.0 04/20/2023   GLUCOSE 156 (H) 06/16/2023   CHOL 191 03/17/2022   TRIG 168.0 (H) 03/17/2022   HDL 36.20 (L) 03/17/2022   LDLDIRECT 137.0 03/10/2017   LDLCALC 121 (H) 03/17/2022   ALT 26 04/20/2023   AST 31 04/20/2023   NA 135 06/16/2023   K 4.4 06/16/2023   CL 100 06/16/2023   CREATININE 1.13 06/16/2023   BUN 14 06/16/2023   CO2 23 06/16/2023   TSH 1.74 04/20/2023   PSA 0.09 (L) 03/30/2019   INR 2.0 08/21/2023   HGBA1C 5.8 11/12/2021    No results found.  Assessment & Plan:   Problem List Items Addressed This Visit     DVT of leg (deep venous thrombosis) (HCC)   On Coumadin      Relevant Orders   VAS [place] LOWER EXTREMITY VENOUS (DVT)   Leg pain, central, right   New Sprain vs DVT Oxy prn  Potential benefits of a short or long term opioids use as well as potential risks (i.e. addiction risk, apnea etc) and complications (i.e.  Somnolence, constipation and others) were explained to the patient and were aknowledged. Ven Duplex ordered Heat Elevate Cont w/Coumadin Compr socks      Relevant Orders   VAS [place] LOWER EXTREMITY VENOUS (DVT)   Long term (current) use of anticoagulants - Primary   On Coumadin      History of DVT of lower extremity   Cont w/Coumadin Compr socks      Relevant Orders   VAS [place] LOWER EXTREMITY VENOUS (DVT)      Meds ordered this encounter  Medications   Oxycodone HCl 10 MG TABS    Sig: Take 1 tablet (10 mg total) by mouth every 6 (six) hours as needed.    Dispense:  20 tablet    Refill:  0      Follow-up: No follow-ups on file.  Sonda Primes, MD

## 2023-08-27 ENCOUNTER — Ambulatory Visit (HOSPITAL_COMMUNITY)
Admission: RE | Admit: 2023-08-27 | Discharge: 2023-08-27 | Disposition: A | Discharge status: Home / Self Care | Payer: MEDICARE | Source: Ambulatory Visit | Attending: Cardiology | Admitting: Cardiology

## 2023-08-27 DIAGNOSIS — Z86718 Personal history of other venous thrombosis and embolism: Secondary | ICD-10-CM | POA: Insufficient documentation

## 2023-08-27 DIAGNOSIS — M79604 Pain in right leg: Secondary | ICD-10-CM | POA: Diagnosis not present

## 2023-08-27 DIAGNOSIS — I825Y3 Chronic embolism and thrombosis of unspecified deep veins of proximal lower extremity, bilateral: Secondary | ICD-10-CM | POA: Insufficient documentation

## 2023-08-30 ENCOUNTER — Encounter: Payer: Self-pay | Admitting: Internal Medicine

## 2023-08-31 NOTE — Telephone Encounter (Signed)
 Right upper leg acute trauma/sprain injury pain. Thanks

## 2023-09-02 ENCOUNTER — Ambulatory Visit: Payer: MEDICARE | Admitting: Internal Medicine

## 2023-09-08 ENCOUNTER — Ambulatory Visit (HOSPITAL_COMMUNITY): Payer: MEDICARE | Attending: Cardiovascular Disease

## 2023-09-08 DIAGNOSIS — I361 Nonrheumatic tricuspid (valve) insufficiency: Secondary | ICD-10-CM

## 2023-09-08 DIAGNOSIS — I4811 Longstanding persistent atrial fibrillation: Secondary | ICD-10-CM | POA: Insufficient documentation

## 2023-09-08 LAB — ECHOCARDIOGRAM COMPLETE
AR max vel: 3.34 cm2
AV Area VTI: 3.28 cm2
AV Area mean vel: 3.44 cm2
AV Mean grad: 9 mmHg
AV Peak grad: 17.6 mmHg
Ao pk vel: 2.1 m/s
MV M vel: 4.81 m/s
MV Peak grad: 92.5 mmHg
S' Lateral: 4 cm

## 2023-09-09 ENCOUNTER — Encounter: Payer: Self-pay | Admitting: Cardiology

## 2023-09-14 ENCOUNTER — Other Ambulatory Visit: Payer: Self-pay | Admitting: Internal Medicine

## 2023-09-15 NOTE — Progress Notes (Signed)
 Remote pacemaker transmission.

## 2023-09-18 ENCOUNTER — Ambulatory Visit: Payer: MEDICARE

## 2023-09-18 DIAGNOSIS — Z7901 Long term (current) use of anticoagulants: Secondary | ICD-10-CM

## 2023-09-18 LAB — POCT INR: INR: 1.8 — AB (ref 2.0–3.0)

## 2023-09-18 MED ORDER — OXYCODONE HCL 10 MG PO TABS: 10.0000 mg | ORAL_TABLET | Freq: Four times a day (QID) | ORAL | 0 refills | Status: DC | PRN

## 2023-09-18 NOTE — Progress Notes (Signed)
 Pt's INR was at the lowest range at last INR check. So, weekly change has been made to move INR closer to goal of 2.5 Increase dose today to take 1 1/2 tablets and then change weekly dosing to take 1/2 tablet daily except take 1 tablet on Tuesday and Fridays. Recheck in 2 weeks.

## 2023-09-18 NOTE — Telephone Encounter (Signed)
 Pt in coumadin clinic this morning and inquired about refill request. Advised a msg will be sent to PCP.

## 2023-09-18 NOTE — Patient Instructions (Addendum)
 Pre visit review using our clinic review tool, if applicable. No additional management support is needed unless otherwise documented below in the visit note.  Increase dose today to take 1 1/2 tablets and then change weekly dosing to take 1/2 tablet daily except take 1 tablet on Tuesday and Fridays. Recheck in 2 weeks.

## 2023-09-28 ENCOUNTER — Encounter: Payer: Self-pay | Admitting: Internal Medicine

## 2023-09-28 ENCOUNTER — Ambulatory Visit (INDEPENDENT_AMBULATORY_CARE_PROVIDER_SITE_OTHER): Payer: MEDICARE | Admitting: Internal Medicine

## 2023-09-28 VITALS — BP 100/58 | HR 88 | Temp 97.7°F | Ht 68.0 in | Wt 235.2 lb

## 2023-09-28 DIAGNOSIS — I471 Supraventricular tachycardia, unspecified: Secondary | ICD-10-CM | POA: Diagnosis not present

## 2023-09-28 DIAGNOSIS — I4819 Other persistent atrial fibrillation: Secondary | ICD-10-CM | POA: Diagnosis not present

## 2023-09-28 DIAGNOSIS — E538 Deficiency of other specified B group vitamins: Secondary | ICD-10-CM | POA: Diagnosis not present

## 2023-09-28 DIAGNOSIS — G8929 Other chronic pain: Secondary | ICD-10-CM | POA: Diagnosis not present

## 2023-09-28 DIAGNOSIS — I825Y3 Chronic embolism and thrombosis of unspecified deep veins of proximal lower extremity, bilateral: Secondary | ICD-10-CM | POA: Diagnosis not present

## 2023-09-28 DIAGNOSIS — E785 Hyperlipidemia, unspecified: Secondary | ICD-10-CM

## 2023-09-28 DIAGNOSIS — M545 Low back pain, unspecified: Secondary | ICD-10-CM

## 2023-09-28 LAB — COMPREHENSIVE METABOLIC PANEL WITH GFR
ALT: 20 U/L (ref 0–53)
AST: 26 U/L (ref 0–37)
Albumin: 3.9 g/dL (ref 3.5–5.2)
Alkaline Phosphatase: 83 U/L (ref 39–117)
BUN: 14 mg/dL (ref 6–23)
CO2: 30 meq/L (ref 19–32)
Calcium: 9.1 mg/dL (ref 8.4–10.5)
Chloride: 101 meq/L (ref 96–112)
Creatinine, Ser: 0.87 mg/dL (ref 0.40–1.50)
GFR: 86.5 mL/min (ref >60.00)
Glucose, Bld: 118 mg/dL — ABNORMAL HIGH (ref 70–99)
Potassium: 4 meq/L (ref 3.5–5.1)
Sodium: 139 meq/L (ref 135–145)
Total Bilirubin: 1 mg/dL (ref 0.2–1.2)
Total Protein: 7.3 g/dL (ref 6.0–8.3)

## 2023-09-28 LAB — T4, FREE: Free T4: 0.78 ng/dL (ref 0.60–1.60)

## 2023-09-28 LAB — TSH: TSH: 3.51 u[IU]/mL (ref 0.35–5.50)

## 2023-09-28 MED ORDER — OXYCODONE HCL 10 MG PO TABS: 10.0000 mg | ORAL_TABLET | Freq: Four times a day (QID) | ORAL | 0 refills | Status: DC | PRN

## 2023-09-28 NOTE — Progress Notes (Signed)
 Subjective:  Patient ID: Robert [name], male    DOB: [birth year]  Age: 73 y.o. MRN: [id number]  CC: Medical Management of Chronic Issues (6 Month Follow Up. Would like repeat labs if possible (inquiring on degoxin level, was not sure how often they are to be checking, last checked in October))   HPI [name] presents for   F/u on sprain vs DVT R leg - better   Outpatient Medications Prior to Visit  Medication Sig Dispense Refill   albuterol (VENTOLIN HFA) 108 (90 Base) MCG/ACT inhaler Inhale 2 puffs into the lungs every 6 (six) hours as needed for wheezing or shortness of breath. 6.7 g 0   allopurinol (ZYLOPRIM) 100 MG tablet Take 1 tablet (100 mg total) by mouth daily. 90 tablet 1   celecoxib (CELEBREX) 200 MG capsule Take 1 capsule (200 mg total) by mouth 2 (two) times daily. (Patient taking differently: Take 200 mg by mouth as needed for moderate pain (pain score 4-6).) 14 capsule 0   cetirizine (ZYRTEC) 10 MG tablet Take 1 tablet (10 mg total) by mouth daily. 90 tablet 3   Cholecalciferol (VITAMIN D3) 125 MCG (5000 UT) CAPS Take 1 capsule (5,000 Units total) by mouth daily. 30 capsule 3   colchicine 0.6 MG tablet TAKE 2 TABLETS BY MOUTH AS NEEDED FOR  GOUT  ATTACK  THEN  TAKE  ANOTHER  ONE  TABLET  IN  1  TO  2  HOURS.  DO  NOT  REPEAT  FOR  3  DAYS. 18 tablet 0   digoxin (LANOXIN) 0.125 MG tablet Take 1 tablet (0.125 mg total) by mouth daily. 90 tablet 3   doxycycline (MONODOX) 100 MG capsule Take 1 capsule (100 mg total) by mouth as directed. 1 hour before any dental work, including cleanings. 6 capsule 6   famotidine (PEPCID) 40 MG tablet Take 1 tablet by mouth once daily (Patient taking differently: Take 40 mg by mouth daily. Only when he takes Celebrex) 30 tablet 5   faricimab-svoa (VABYSMO) 6 MG/0.05ML SOLN intravitreal injection 6 mg by Intravitreal route once. Injections every 9 weeks     fish oil-omega-3 fatty acids 1000 MG capsule Take 1 g by mouth every morning.      furosemide (LASIX) 20 MG tablet Take 1 tablet (20 mg total) by mouth daily. 90 tablet 3   glucosamine-chondroitin 500-400 MG tablet Take 1 tablet by mouth every morning.     mupirocin ointment (BACTROBAN) 2 % Place 1 Application into the nose daily as needed (irritation).     nortriptyline (PAMELOR) 25 MG capsule Take 1 capsule (25 mg total) by mouth at bedtime. 90 capsule 3   predniSONE (DELTASONE) 5 MG tablet Take 1 tablet (5 mg total) by mouth daily with breakfast. 90 tablet 3   pregabalin (LYRICA) 75 MG capsule Take 1 capsule by mouth three times daily as needed (Patient taking differently: Take 75 mg by mouth daily as needed (pain).) 90 capsule 1   triamcinolone cream (KENALOG) 0.1 % Apply 1 Application topically 3 (three) times daily. 450 g 1   warfarin (COUMADIN) 6 MG tablet TAKE ONE-HALF (1/2) TABLET DAILY EXCEPT TAKE 1 TABLET ON FRIDAYS OR AS DIRECTED BY ANTICOAGULATION CLINIC 60 tablet 1   Oxycodone HCl 10 MG TABS Take 1 tablet (10 mg total) by mouth every 6 (six) hours as needed. 20 tablet 0   No facility-administered medications prior to visit.    ROS: Review of Systems  Constitutional:  Negative  for appetite change, fatigue and unexpected weight change.  HENT:  Negative for congestion, nosebleeds, sneezing, sore throat and trouble swallowing.   Eyes:  Negative for itching and visual disturbance.  Respiratory:  Negative for cough.   Cardiovascular:  Positive for palpitations and leg swelling. Negative for chest pain.  Gastrointestinal:  Negative for abdominal distention, blood in stool, diarrhea and nausea.  Genitourinary:  Negative for frequency and hematuria.  Musculoskeletal:  Positive for back pain and gait problem. Negative for joint swelling and neck pain.  Skin:  Negative for rash.  Neurological:  Negative for dizziness, tremors, speech difficulty and weakness.  Psychiatric/Behavioral:  Negative for agitation, dysphoric mood, sleep disturbance and suicidal ideas. The  patient is not nervous/anxious.     Objective:  BP (!) 100/58   Pulse 88   Temp 97.7 F (36.5 C)   Ht 5\' 8"  (1.727 m)   Wt 235 lb 3.2 oz (106.7 kg)   SpO2 99%   BMI 35.76 kg/m   BP Readings from Last 3 Encounters:  09/28/23 (!) 100/58  08/26/23 118/78  08/21/23 128/80    Wt Readings from Last 3 Encounters:  09/28/23 235 lb 3.2 oz (106.7 kg)  08/26/23 243 lb (110.2 kg)  08/21/23 243 lb (110.2 kg)    Physical Exam Constitutional:      General: He is not in acute distress.    Appearance: He is well-developed. He is obese.     Comments: NAD  Eyes:     Conjunctiva/sclera: Conjunctivae normal.     Pupils: Pupils are equal, round, and reactive to light.  Neck:     Thyroid: No thyromegaly.     Vascular: No JVD.  Cardiovascular:     Rate and Rhythm: Normal rate and regular rhythm.     Heart sounds: Normal heart sounds. No murmur heard.    No friction rub. No gallop.  Pulmonary:     Effort: Pulmonary effort is normal. No respiratory distress.     Breath sounds: Normal breath sounds. No wheezing or rales.  Chest:     Chest wall: No tenderness.  Abdominal:     General: Bowel sounds are normal. There is no distension.     Palpations: Abdomen is soft. There is no mass.     Tenderness: There is no abdominal tenderness. There is no guarding or rebound.  Musculoskeletal:        General: No tenderness. Normal range of motion.     Cervical back: Normal range of motion.     Right lower leg: Edema present.     Left lower leg: Edema present.  Lymphadenopathy:     Cervical: No cervical adenopathy.  Skin:    General: Skin is warm and dry.     Findings: No rash.  Neurological:     Mental Status: He is alert and oriented to person, place, and time.     Cranial Nerves: No cranial nerve deficit.     Motor: No abnormal muscle tone.     Coordination: Coordination normal.     Gait: Gait normal.     Deep Tendon Reflexes: Reflexes are normal and symmetric.  Psychiatric:         Behavior: Behavior normal.        Thought Content: Thought content normal.        Judgment: Judgment normal.     Lab Results  Component Value Date   WBC 8.1 04/20/2023   HGB 17.1 (H) 04/20/2023   HCT 50.6 04/20/2023  PLT 246.0 04/20/2023   GLUCOSE 156 (H) 06/16/2023   CHOL 191 03/17/2022   TRIG 168.0 (H) 03/17/2022   HDL 36.20 (L) 03/17/2022   LDLDIRECT 137.0 03/10/2017   LDLCALC 121 (H) 03/17/2022   ALT 26 04/20/2023   AST 31 04/20/2023   NA 135 06/16/2023   K 4.4 06/16/2023   CL 100 06/16/2023   CREATININE 1.13 06/16/2023   BUN 14 06/16/2023   CO2 23 06/16/2023   TSH 1.74 04/20/2023   PSA 0.09 (L) 03/30/2019   INR 1.8 (A) 09/18/2023   HGBA1C 5.8 11/12/2021    VAS [place] LOWER EXTREMITY VENOUS (DVT) Result Date: 08/27/2023  Lower Venous DVT Study Patient Name:  SHANAN MCMILLER  Date of Exam:   08/27/2023 Medical Rec #: [id number]          Accession #:    [id number] Date of Birth: 08-May-1951          Patient Gender: M Patient Age:   86 years Exam Location:  [facility] Procedure:      VAS [place] LOWER EXTREMITY VENOUS (DVT) Referring Phys: Macarthur Critchley Kennedey Digilio --------------------------------------------------------------------------------  Indications: Edema.  Limitations: Body habitus. Comparison Study: 03/25/2020 Negative lower extremity venous duplex Performing Technologist: Gertie Fey MHA, RDMS, RVT, RDCS  Examination Guidelines: A complete evaluation includes B-mode imaging, spectral Doppler, color Doppler, and power Doppler as needed of all accessible portions of each vessel. Bilateral testing is considered an integral part of a complete examination. Limited examinations for reoccurring indications may be performed as noted. The reflux portion of the exam is performed with the patient in reverse Trendelenburg.  +---------+---------------+---------+-----------+----------+--------------+ RIGHT    CompressibilityPhasicitySpontaneityPropertiesThrombus Aging  +---------+---------------+---------+-----------+----------+--------------+ CFV      Full           Yes      Yes                                 +---------+---------------+---------+-----------+----------+--------------+ SFJ      Full                                                        +---------+---------------+---------+-----------+----------+--------------+ FV Prox  Full                                                        +---------+---------------+---------+-----------+----------+--------------+ FV Mid   Partial                 Yes                  Chronic        +---------+---------------+---------+-----------+----------+--------------+ FV DistalPartial                 Yes                  Chronic        +---------+---------------+---------+-----------+----------+--------------+ PFV      Full                                                        +---------+---------------+---------+-----------+----------+--------------+  POP      Partial        Yes      Yes                  Chronic        +---------+---------------+---------+-----------+----------+--------------+ PTV      Full                                                        +---------+---------------+---------+-----------+----------+--------------+ PERO     Full                                                        +---------+---------------+---------+-----------+----------+--------------+   +----+---------------+---------+-----------+----------+--------------+ LEFTCompressibilityPhasicitySpontaneityPropertiesThrombus Aging +----+---------------+---------+-----------+----------+--------------+ CFV Full           Yes      Yes                                 +----+---------------+---------+-----------+----------+--------------+     Summary: RIGHT: - Findings consistent with chronic deep vein thrombosis involving the right femoral vein, and right popliteal vein.  - There is  no evidence of acute deep vein thrombosis in the lower extremity.  - No cystic structure found in the popliteal fossa.  LEFT: - No evidence of common femoral vein obstruction.   *See table(s) above for measurements and observations. Electronically signed by Heath Lark on 08/27/2023 at 8:43:18 PM.    Final     Assessment & Plan:   Problem List Items Addressed This Visit     Dyslipidemia   Pt declined statins      DVT of leg (deep venous thrombosis) (HCC) - Primary   On Coumadin      Relevant Orders   Comprehensive metabolic panel with GFR   Digoxin level   T4, free   TSH   B12 deficiency   On B12      Low back pain   Blue-Emu cream was recommended to use 2-3 times a day Using a Light Wave patch Oxy po prn w/caution  Potential benefits of a short/long term opioids use as well as potential risks (i.e. addiction risk, apnea etc) and complications (i.e. Somnolence, constipation and others) were explained to the patient and were aknowledged.       Relevant Medications   Oxycodone HCl 10 MG TABS   SVT (supraventricular tachycardia) (HCC)   Per Dr Cristal Deer Continue with current prescription therapy as reflected on the Med list. On Digoxin - check levels       Atrial fibrillation (HCC)   Per Dr Cristal Deer Rate controlled Anticoagulated w/Coumadin Continue with current prescription therapy as reflected on the Med list. On Digoxin - check levels      Relevant Orders   Comprehensive metabolic panel with GFR   Digoxin level   T4, free   TSH      Meds ordered this encounter  Medications   Oxycodone HCl 10 MG TABS    Sig: Take 1 tablet (10 mg total) by mouth every 6 (six) hours as needed.    Dispense:  30 tablet    Refill:  0      Follow-up: Return in about 2 months (around 11/28/2023) for a follow-up visit.  Sonda Primes, MD

## 2023-09-28 NOTE — Assessment & Plan Note (Addendum)
 Blue-Emu cream was recommended to use 2-3 times a day Using a Light Wave patch Oxy po prn w/caution  Potential benefits of a short/long term opioids use as well as potential risks (i.e. addiction risk, apnea etc) and complications (i.e. Somnolence, constipation and others) were explained to the patient and were aknowledged.

## 2023-09-28 NOTE — Assessment & Plan Note (Signed)
 On Coumadin

## 2023-09-28 NOTE — Assessment & Plan Note (Signed)
 Per Dr Cristal Deer Continue with current prescription therapy as reflected on the Med list. On Digoxin - check levels

## 2023-09-28 NOTE — Assessment & Plan Note (Signed)
 Pt declined statins

## 2023-09-28 NOTE — Assessment & Plan Note (Signed)
 Per Dr Cristal Deer Rate controlled Anticoagulated w/Coumadin Continue with current prescription therapy as reflected on the Med list. On Digoxin - check levels

## 2023-09-28 NOTE — Assessment & Plan Note (Signed)
 On B12

## 2023-09-29 LAB — DIGOXIN LEVEL: Digoxin Level: <0.5 ug/L — ABNORMAL LOW (ref 0.8–2.0)

## 2023-09-30 ENCOUNTER — Encounter: Payer: Self-pay | Admitting: Internal Medicine

## 2023-10-01 ENCOUNTER — Encounter (HOSPITAL_BASED_OUTPATIENT_CLINIC_OR_DEPARTMENT_OTHER): Payer: Self-pay

## 2023-10-02 ENCOUNTER — Ambulatory Visit: Payer: MEDICARE

## 2023-10-02 DIAGNOSIS — Z7901 Long term (current) use of anticoagulants: Secondary | ICD-10-CM | POA: Diagnosis not present

## 2023-10-02 LAB — POCT INR: INR: 1.9 — AB (ref 2.0–3.0)

## 2023-10-02 NOTE — Progress Notes (Signed)
 Increase dose today to take 1 1/2 tablets and then change weekly dosing to take 1/2 tablet daily except take 1 tablet on Monday, Wednesday and Friday. Recheck in 2 weeks.

## 2023-10-02 NOTE — Patient Instructions (Addendum)
 Pre visit review using our clinic review tool, if applicable. No additional management support is needed unless otherwise documented below in the visit note.  Increase dose today to take 1 1/2 tablets and then change weekly dosing to take 1/2 tablet daily except take 1 tablet on Monday, Wednesday and Friday. Recheck in 2 weeks.

## 2023-10-16 ENCOUNTER — Ambulatory Visit: Payer: MEDICARE

## 2023-10-16 DIAGNOSIS — Z7901 Long term (current) use of anticoagulants: Secondary | ICD-10-CM | POA: Diagnosis not present

## 2023-10-16 LAB — POCT INR: INR: 3 (ref 2.0–3.0)

## 2023-10-16 NOTE — Progress Notes (Signed)
 Continue 1/2 tablet daily except take 1 tablet on Monday, Wednesday and Friday. Recheck in 4 weeks.

## 2023-10-16 NOTE — Patient Instructions (Addendum)
 Pre visit review using our clinic review tool, if applicable. No additional management support is needed unless otherwise documented below in the visit note.  Continue 1/2 tablet daily except take 1 tablet on Monday, Wednesday and Friday. Recheck in 4 weeks.

## 2023-10-19 ENCOUNTER — Ambulatory Visit: Payer: MEDICARE | Admitting: Internal Medicine

## 2023-10-22 ENCOUNTER — Telehealth: Payer: Self-pay | Admitting: Cardiology

## 2023-10-22 DIAGNOSIS — H34831 Tributary (branch) retinal vein occlusion, right eye, with macular edema: Secondary | ICD-10-CM | POA: Diagnosis not present

## 2023-10-22 NOTE — Telephone Encounter (Signed)
 Device Transmission reviewed:   Normal Device Function AF burden high with some elevated V - rates patient is set up to see A. Tillery , PA-C on 11/03/23 to discuss ablation to treat and hopefully improve BIV pace percentage. 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 Optivol /Thoracic impedance show elevations in April but return to normal at present.   Discussed with wife/patient that patient's device is unable to make noise or vibrate. I reassured her that device is working appropriately.  She denies patient is having any symptoms of diaphragmatic stim.  I offered device clinic check today in office for re-assurance;however, patient/wife refused due to having an eye procedure today and not wanting to come out again today.    Wife is concerned because he has extensive cough that started close to 2 weeks ago and he becomes SOB when he coughs.  To wife, sounds like patient has a lot of chest congestion.  He denies any SOB on exertion or otherwise and no visible edema to extremities or trunk.    I strongly encouraged wife/patient to contact PCP to discuss chest congestion to rule out any possible respiratory issue or suggest appropriate treatment.  Also gave ER precautions if symptoms progress or SOB is severe.  We will see patient in clinic on 11/03/23.

## 2023-10-22 NOTE — Telephone Encounter (Signed)
  1. Has your device fired?   2. Is you device beeping?   Was "tweeting"  3. Are you experiencing draining or swelling at device site?   4. Are you calling to see if we received your device transmission?   5. Have you passed out?   Wife Robert [name]) stated this morning patient's device was tweeting and vibrating.  Wife wants to know if this is normal.   Please route to Device Clinic Pool

## 2023-10-22 NOTE — Telephone Encounter (Signed)
 Pt sent a transmission and would like a call back as to why his device was vibrating and making sounds in his chest. Pt has not chest pain just some congestion

## 2023-10-26 ENCOUNTER — Other Ambulatory Visit: Payer: Self-pay | Admitting: Physician Assistant

## 2023-10-26 DIAGNOSIS — Z952 Presence of prosthetic heart valve: Secondary | ICD-10-CM

## 2023-10-29 ENCOUNTER — Emergency Department (HOSPITAL_COMMUNITY)
Admission: EM | Admit: 2023-10-29 | Discharge: 2023-10-30 | Disposition: A | Discharge status: Home / Self Care | Payer: MEDICARE | Attending: Emergency Medicine | Admitting: Emergency Medicine

## 2023-10-29 ENCOUNTER — Other Ambulatory Visit: Payer: Self-pay

## 2023-10-29 ENCOUNTER — Emergency Department (HOSPITAL_COMMUNITY): Payer: MEDICARE

## 2023-10-29 ENCOUNTER — Encounter (HOSPITAL_COMMUNITY): Payer: Self-pay

## 2023-10-29 DIAGNOSIS — Z7901 Long term (current) use of anticoagulants: Secondary | ICD-10-CM | POA: Diagnosis not present

## 2023-10-29 DIAGNOSIS — G500 Trigeminal neuralgia: Secondary | ICD-10-CM | POA: Insufficient documentation

## 2023-10-29 DIAGNOSIS — R070 Pain in throat: Secondary | ICD-10-CM | POA: Diagnosis not present

## 2023-10-29 DIAGNOSIS — Z950 Presence of cardiac pacemaker: Secondary | ICD-10-CM | POA: Diagnosis not present

## 2023-10-29 DIAGNOSIS — R059 Cough, unspecified: Secondary | ICD-10-CM | POA: Diagnosis not present

## 2023-10-29 DIAGNOSIS — R519 Headache, unspecified: Secondary | ICD-10-CM | POA: Diagnosis present

## 2023-10-29 HISTORY — DX: Heart failure, unspecified: I50.9

## 2023-10-29 LAB — BASIC METABOLIC PANEL WITH GFR
Anion gap: 9 (ref 5–15)
BUN: 12 mg/dL (ref 8–23)
CO2: 28 mmol/L (ref 22–32)
Calcium: 9.2 mg/dL (ref 8.9–10.3)
Chloride: 101 mmol/L (ref 98–111)
Creatinine, Ser: 0.8 mg/dL (ref 0.61–1.24)
GFR, Estimated: >60 mL/min (ref >60)
Glucose, Bld: 111 mg/dL — ABNORMAL HIGH (ref 70–99)
Potassium: 4.4 mmol/L (ref 3.5–5.1)
Sodium: 138 mmol/L (ref 135–145)

## 2023-10-29 LAB — CBC
HCT: 45.7 % (ref 39.0–52.0)
Hemoglobin: 15.7 g/dL (ref 13.0–17.0)
MCH: 33.1 pg (ref 26.0–34.0)
MCHC: 34.4 g/dL (ref 30.0–36.0)
MCV: 96.4 fL (ref 80.0–100.0)
Platelets: 240 10*3/uL (ref 150–400)
RBC: 4.74 MIL/uL (ref 4.22–5.81)
RDW: 13.1 % (ref 11.5–15.5)
WBC: 6.8 10*3/uL (ref 4.0–10.5)
nRBC: 0 % (ref 0.0–0.2)

## 2023-10-29 NOTE — ED Triage Notes (Signed)
 Pt c/o R side jaw and throat pain and difficulty swallowing food, numbness to R bottom lip since last night; states he has had nerve issues in the lip "for a while"; denies cp, denies sob; endorses productive cough; denies fevers

## 2023-10-30 ENCOUNTER — Encounter: Payer: Self-pay | Admitting: Internal Medicine

## 2023-10-30 ENCOUNTER — Ambulatory Visit (INDEPENDENT_AMBULATORY_CARE_PROVIDER_SITE_OTHER): Payer: MEDICARE | Admitting: Internal Medicine

## 2023-10-30 ENCOUNTER — Encounter: Payer: Self-pay | Admitting: Neurology

## 2023-10-30 VITALS — BP 110/60 | HR 45 | Temp 98.1°F | Ht 68.0 in | Wt 225.0 lb

## 2023-10-30 DIAGNOSIS — G500 Trigeminal neuralgia: Secondary | ICD-10-CM | POA: Insufficient documentation

## 2023-10-30 DIAGNOSIS — I825Y3 Chronic embolism and thrombosis of unspecified deep veins of proximal lower extremity, bilateral: Secondary | ICD-10-CM

## 2023-10-30 DIAGNOSIS — I5022 Chronic systolic (congestive) heart failure: Secondary | ICD-10-CM | POA: Diagnosis not present

## 2023-10-30 LAB — PROTIME-INR
INR: 2.5 — ABNORMAL HIGH (ref 0.8–1.2)
Prothrombin Time: 27.3 s — ABNORMAL HIGH (ref 11.4–15.2)

## 2023-10-30 MED ORDER — ONDANSETRON HCL 4 MG/2ML IJ SOLN
4.0000 mg | Freq: Once | INTRAMUSCULAR | Status: AC
  Administered 2023-10-30: 4 mg via INTRAVENOUS
  Filled 2023-10-30: qty 2

## 2023-10-30 MED ORDER — OXYCODONE HCL 10 MG PO TABS: 10.0000 mg | ORAL_TABLET | Freq: Four times a day (QID) | ORAL | 0 refills | Status: DC | PRN

## 2023-10-30 MED ORDER — PREGABALIN 75 MG PO CAPS: 75.0000 mg | ORAL_CAPSULE | Freq: Three times a day (TID) | ORAL | 5 refills | Status: DC

## 2023-10-30 MED ORDER — FUROSEMIDE 20 MG PO TABS: 20.0000 mg | ORAL_TABLET | Freq: Two times a day (BID) | ORAL | 3 refills | Status: DC

## 2023-10-30 MED ORDER — METHYLPREDNISOLONE ACETATE 80 MG/ML IJ SUSP
80.0000 mg | Freq: Once | INTRAMUSCULAR | Status: AC
  Administered 2023-10-30: 80 mg via INTRAMUSCULAR

## 2023-10-30 MED ORDER — MORPHINE SULFATE (PF) 4 MG/ML IV SOLN
4.0000 mg | Freq: Once | INTRAVENOUS | Status: AC
  Administered 2023-10-30: 4 mg via INTRAVENOUS
  Filled 2023-10-30: qty 1

## 2023-10-30 MED ORDER — SODIUM CHLORIDE 0.9 % IV SOLN
1500.0000 mg | Freq: Once | INTRAVENOUS | Status: AC
  Administered 2023-10-30: 1500 mg via INTRAVENOUS
  Filled 2023-10-30: qty 30

## 2023-10-30 NOTE — Addendum Note (Signed)
 Addended by: Hildegard Low R on: 10/30/2023 10:14 AM   Modules accepted: Orders

## 2023-10-30 NOTE — Assessment & Plan Note (Signed)
 On Coumadin  INR was 2.5 mg

## 2023-10-30 NOTE — Assessment & Plan Note (Signed)
 Increase lasix  to bid due to edema

## 2023-10-30 NOTE — Assessment & Plan Note (Signed)
 Worse - a flare up Depo-medrol  80 mg IM Lyrica  75 mg tid Oxy 10 mg qid prn  Potential benefits of a lshort/ong term opioids use as well as potential risks (i.e. addiction risk, apnea etc) and complications (i.e. Somnolence, constipation and others) were explained to the patient and were aknowledged. Will sch w/Dr Festus Hubert

## 2023-10-30 NOTE — Progress Notes (Signed)
 Subjective:  Patient ID: Robert [name], male    DOB: 1950-10-08  Age: 73 y.o. MRN: [id number]  CC: Cough (Persistent productive cough... Pt was in the ER over night.)   HPI Robert [name] presents for R trigeminal neuralgia - pain is 0 now, it was 15/10 Dilaudid  helped for 1 h - he spent all night in the ER. He was unable to eat or drink due to pain... INR 2.5 CXR ok  Per ER: "Patient with symptoms of right facial pain, preauricular pain, some improvement with pressing in the preauricular area.  This does seem consistent with his known history of trigeminal neuralgia. Morphine  helped.   Patient still hesitant to take daily medication.  He was given Dilaudid  and has had improved pain.  Will give a load of fosphenytoin.  He does not want to take daily Dilantin.  Will refer back to his neurologist for repeat evaluation."  Outpatient Medications Prior to Visit  Medication Sig Dispense Refill   albuterol  (VENTOLIN  HFA) 108 (90 Base) MCG/ACT inhaler Inhale 2 puffs into the lungs every 6 (six) hours as needed for wheezing or shortness of breath. 6.7 g 0   allopurinol  (ZYLOPRIM ) 100 MG tablet Take 1 tablet (100 mg total) by mouth daily. 90 tablet 1   celecoxib  (CELEBREX ) 200 MG capsule Take 1 capsule (200 mg total) by mouth 2 (two) times daily. (Patient taking differently: Take 200 mg by mouth as needed for moderate pain (pain score 4-6).) 14 capsule 0   cetirizine  (ZYRTEC ) 10 MG tablet Take 1 tablet (10 mg total) by mouth daily. 90 tablet 3   Cholecalciferol (VITAMIN D3) 125 MCG (5000 UT) CAPS Take 1 capsule (5,000 Units total) by mouth daily. 30 capsule 3   colchicine  0.6 MG tablet TAKE 2 TABLETS BY MOUTH AS NEEDED FOR  GOUT  ATTACK  THEN  TAKE  ANOTHER  ONE  TABLET  IN  1  TO  2  HOURS.  DO  NOT  REPEAT  FOR  3  DAYS. 18 tablet 0   digoxin  (LANOXIN ) 0.125 MG tablet Take 1 tablet (0.125 mg total) by mouth daily. 90 tablet 3   doxycycline  (MONODOX ) 100 MG capsule Take 1 capsule (100 mg  total) by mouth as directed. 1 hour before any dental work, including cleanings. 6 capsule 6   famotidine  (PEPCID ) 40 MG tablet Take 1 tablet by mouth once daily (Patient taking differently: Take 40 mg by mouth daily. Only when he takes Celebrex ) 30 tablet 5   faricimab -svoa (VABYSMO ) 6 MG/0.05ML SOLN intravitreal injection 6 mg by Intravitreal route once. Injections every 9 weeks     fish oil-omega-3 fatty acids  1000 MG capsule Take 1 g by mouth every morning.     glucosamine-chondroitin 500-400 MG tablet Take 1 tablet by mouth every morning.     mupirocin ointment (BACTROBAN) 2 % Place 1 Application into the nose daily as needed (irritation).     nortriptyline  (PAMELOR ) 25 MG capsule Take 1 capsule (25 mg total) by mouth at bedtime. 90 capsule 3   Oxycodone  HCl 10 MG TABS Take 1 tablet (10 mg total) by mouth every 6 (six) hours as needed. 30 tablet 0   predniSONE  (DELTASONE ) 5 MG tablet Take 1 tablet (5 mg total) by mouth daily with breakfast. 90 tablet 3   triamcinolone  cream (KENALOG ) 0.1 % Apply 1 Application topically 3 (three) times daily. 450 g 1   warfarin (COUMADIN ) 6 MG tablet TAKE ONE-HALF (1/2) TABLET DAILY EXCEPT TAKE  1 TABLET ON FRIDAYS OR AS DIRECTED BY ANTICOAGULATION CLINIC 60 tablet 1   furosemide  (LASIX ) 20 MG tablet Take 1 tablet (20 mg total) by mouth daily. 90 tablet 3   pregabalin  (LYRICA ) 75 MG capsule Take 1 capsule by mouth three times daily as needed (Patient taking differently: Take 75 mg by mouth daily as needed (pain).) 90 capsule 1   No facility-administered medications prior to visit.    ROS: Review of Systems  Constitutional:  Negative for appetite change, fatigue and unexpected weight change.  HENT:  Negative for congestion, nosebleeds, sneezing, sore throat and trouble swallowing.   Eyes:  Negative for itching and visual disturbance.  Respiratory:  Negative for cough.   Cardiovascular:  Negative for chest pain, palpitations and leg swelling.   Gastrointestinal:  Negative for abdominal distention, blood in stool, diarrhea and nausea.  Genitourinary:  Negative for frequency and hematuria.  Musculoskeletal:  Negative for back pain, gait problem, joint swelling and neck pain.  Skin:  Negative for rash.  Neurological:  Positive for weakness and headaches. Negative for dizziness, tremors and speech difficulty.  Psychiatric/Behavioral:  Negative for agitation, dysphoric mood, sleep disturbance and suicidal ideas. The patient is not nervous/anxious.    R forehead, temple w/pain  Objective:  BP 110/60   Pulse (!) 45   Temp 98.1 F (36.7 C) (Oral)   Ht 5\' 8"  (1.727 m)   Wt 225 lb (102.1 kg)   SpO2 93%   BMI 34.21 kg/m   BP Readings from Last 3 Encounters:  10/30/23 110/60  10/30/23 122/74  09/28/23 (!) 100/58    Wt Readings from Last 3 Encounters:  10/30/23 225 lb (102.1 kg)  10/30/23 235 lb 3.7 oz (106.7 kg)  09/28/23 235 lb 3.2 oz (106.7 kg)    Physical Exam Constitutional:      General: He is not in acute distress.    Appearance: He is well-developed. He is obese.     Comments: NAD  Eyes:     Conjunctiva/sclera: Conjunctivae normal.     Pupils: Pupils are equal, round, and reactive to light.  Neck:     Thyroid : No thyromegaly.     Vascular: No JVD.  Cardiovascular:     Rate and Rhythm: Normal rate and regular rhythm.     Heart sounds: Normal heart sounds. No murmur heard.    No friction rub. No gallop.  Pulmonary:     Effort: Pulmonary effort is normal. No respiratory distress.     Breath sounds: Normal breath sounds. No wheezing or rales.  Chest:     Chest wall: No tenderness.  Abdominal:     General: Bowel sounds are normal. There is no distension.     Palpations: Abdomen is soft. There is no mass.     Tenderness: There is no abdominal tenderness. There is no guarding or rebound.  Musculoskeletal:        General: No tenderness. Normal range of motion.     Cervical back: Normal range of motion.   Lymphadenopathy:     Cervical: No cervical adenopathy.  Skin:    General: Skin is warm and dry.     Findings: No rash.  Neurological:     Mental Status: He is alert and oriented to person, place, and time.     Cranial Nerves: No cranial nerve deficit.     Motor: No abnormal muscle tone.     Coordination: Coordination normal.     Gait: Gait normal.  Deep Tendon Reflexes: Reflexes are normal and symmetric.  Psychiatric:        Behavior: Behavior normal.        Thought Content: Thought content normal.        Judgment: Judgment normal.   In a w/c  Lab Results  Component Value Date   WBC 6.8 10/29/2023   HGB 15.7 10/29/2023   HCT 45.7 10/29/2023   PLT 240 10/29/2023   GLUCOSE 111 (H) 10/29/2023   CHOL 191 03/17/2022   TRIG 168.0 (H) 03/17/2022   HDL 36.20 (L) 03/17/2022   LDLDIRECT 137.0 03/10/2017   LDLCALC 121 (H) 03/17/2022   ALT 20 09/28/2023   AST 26 09/28/2023   NA 138 10/29/2023   K 4.4 10/29/2023   CL 101 10/29/2023   CREATININE 0.80 10/29/2023   BUN 12 10/29/2023   CO2 28 10/29/2023   TSH 3.51 09/28/2023   PSA 0.09 (L) 03/30/2019   INR 2.5 (H) 10/30/2023   HGBA1C 5.8 11/12/2021    DG Chest 2 View Result Date: 10/29/2023 CLINICAL DATA:  Cough. EXAM: CHEST - 2 VIEW COMPARISON:  May 14, 2023. FINDINGS: Stable cardiomediastinal silhouette. Status post transcatheter aortic valve repair. Left-sided pacemaker is unchanged. No acute pulmonary disease is noted. Bony thorax is unremarkable. IMPRESSION: No active cardiopulmonary disease. Electronically Signed   By: Rosalene Colon M.D.   On: 10/29/2023 18:21    Assessment & Plan:   Problem List Items Addressed This Visit     DVT of leg (deep venous thrombosis) (HCC)   On Coumadin  INR was 2.5 mg      Relevant Medications   furosemide  (LASIX ) 20 MG tablet   Trigeminal neuralgia - Primary   Worse - a flare up Depo-medrol  80 mg IM Lyrica  75 mg tid Oxy 10 mg qid prn  Potential benefits of a lshort/ong  term opioids use as well as potential risks (i.e. addiction risk, apnea etc) and complications (i.e. Somnolence, constipation and others) were explained to the patient and were aknowledged. Will sch w/Dr Festus Hubert       Relevant Medications   pregabalin  (LYRICA ) 75 MG capsule   Other Relevant Orders   Ambulatory referral to Neurology   Chronic systolic heart failure (HCC)   Increase lasix  to bid due to edema      Relevant Medications   furosemide  (LASIX ) 20 MG tablet      Meds ordered this encounter  Medications   pregabalin  (LYRICA ) 75 MG capsule    Sig: Take 1 capsule (75 mg total) by mouth 3 (three) times daily.    Dispense:  90 capsule    Refill:  5   Oxycodone  HCl 10 MG TABS    Sig: Take 1 tablet (10 mg total) by mouth 4 (four) times daily as needed.    Dispense:  20 tablet    Refill:  0   furosemide  (LASIX ) 20 MG tablet    Sig: Take 1 tablet (20 mg total) by mouth 2 (two) times daily.    Dispense:  180 tablet    Refill:  3      Follow-up: Return in about 2 weeks (around 11/13/2023) for a follow-up visit.  Anitra Barn, MD

## 2023-10-30 NOTE — ED Provider Notes (Signed)
 [place] EMERGENCY DEPARTMENT AT [facility] Provider Note   CSN: [id number] Arrival date & time: 10/29/23  1623     History  Chief Complaint  Patient presents with   Jaw Pain    Robert [name] is a 73 y.o. male.  Patient with increasing right-sided facial pain, pain in the right side of his throat with numbness of the right bottom lip.  Patient reports that he has been having ongoing problems with right facial pain for many years.  He saw Dr. Festus Hubert in the past and was diagnosed with trigeminal neuralgia.  He never did any of the follow-up or medications because he was concerned about his heart.  Patient has been prescribed Lyrica  by his primary care doctor which he would take for a couple of days at a time and his symptoms were improved, however for the last 3 weeks he has had persistent and worsening pain not responding to Lyrica .       Home Medications Prior to Admission medications   Medication Sig Start Date End Date Taking? Authorizing Provider  albuterol  (VENTOLIN  HFA) 108 (90 Base) MCG/ACT inhaler Inhale 2 puffs into the lungs every 6 (six) hours as needed for wheezing or shortness of breath. 03/22/20   Ghimire, Estil Heman, MD  allopurinol  (ZYLOPRIM ) 100 MG tablet Take 1 tablet (100 mg total) by mouth daily. 07/24/23   [name], Aleksei V, MD  celecoxib  (CELEBREX ) 200 MG capsule Take 1 capsule (200 mg total) by mouth 2 (two) times daily. Patient taking differently: Take 200 mg by mouth as needed for moderate pain (pain score 4-6). 04/09/22   [name], Oakley Bellman, MD  cetirizine  (ZYRTEC ) 10 MG tablet Take 1 tablet (10 mg total) by mouth daily. 07/24/23   [name], Aleksei V, MD  Cholecalciferol (VITAMIN D3) 125 MCG (5000 UT) CAPS Take 1 capsule (5,000 Units total) by mouth daily. 07/24/23   [name], Aleksei V, MD  colchicine  0.6 MG tablet TAKE 2 TABLETS BY MOUTH AS NEEDED FOR  GOUT  ATTACK  THEN  TAKE  ANOTHER  ONE  TABLET  IN  1  TO  2  HOURS.  DO  NOT  REPEAT   FOR  3  DAYS. 07/24/23   [name], Aleksei V, MD  digoxin  (LANOXIN ) 0.125 MG tablet Take 1 tablet (0.125 mg total) by mouth daily. 03/06/23   Sheryle Donning, MD  doxycycline  (MONODOX ) 100 MG capsule Take 1 capsule (100 mg total) by mouth as directed. 1 hour before any dental work, including cleanings. 01/14/23   Ardia Kraft, PA-C  famotidine  (PEPCID ) 40 MG tablet Take 1 tablet by mouth once daily Patient taking differently: Take 40 mg by mouth daily. Only when he takes Celebrex  04/30/22   [name], Oakley Bellman, MD  faricimab -svoa (VABYSMO ) 6 MG/0.05ML SOLN intravitreal injection 6 mg by Intravitreal route once. Injections every 9 weeks    [provider]  fish oil-omega-3 fatty acids  1000 MG capsule Take 1 g by mouth every morning.    [provider]  furosemide  (LASIX ) 20 MG tablet Take 1 tablet (20 mg total) by mouth daily. 07/24/23   [name], Aleksei V, MD  glucosamine-chondroitin 500-400 MG tablet Take 1 tablet by mouth every morning.    [provider]  mupirocin ointment (BACTROBAN) 2 % Place 1 Application into the nose daily as needed (irritation).    [provider]  nortriptyline  (PAMELOR ) 25 MG capsule Take 1 capsule (25 mg total) by mouth at bedtime. 07/24/23   [name],  Aleksei V, MD  Oxycodone  HCl 10 MG TABS Take 1 tablet (10 mg total) by mouth every 6 (six) hours as needed. 09/28/23   [name], Oakley Bellman, MD  predniSONE  (DELTASONE ) 5 MG tablet Take 1 tablet (5 mg total) by mouth daily with breakfast. 07/24/23   [name], Oakley Bellman, MD  pregabalin  (LYRICA ) 75 MG capsule Take 1 capsule by mouth three times daily as needed Patient taking differently: Take 75 mg by mouth daily as needed (pain). 02/12/23   [name], Aleksei V, MD  triamcinolone  cream (KENALOG ) 0.1 % Apply 1 Application topically 3 (three) times daily. 07/24/23   [name], Aleksei V, MD  warfarin (COUMADIN ) 6 MG tablet TAKE ONE-HALF (1/2) TABLET DAILY EXCEPT TAKE 1 TABLET ON  FRIDAYS OR AS DIRECTED BY ANTICOAGULATION CLINIC 02/27/23   [name], Aleksei V, MD      Allergies    Cardizem  [diltiazem ], Cat hair extract, Lopressor  [metoprolol  tartrate], Sulfa antibiotics, Amiodarone , Anoro ellipta  [umeclidinium-vilanterol], Breo ellipta [fluticasone  furoate-vilanterol], Buspar  [buspirone ], Entresto  [sacubitril-valsartan], Exalgo  [hydromorphone  hcl], Gabapentin, Hydrocodone-acetaminophen , Penicillins, Red dye, Sulfonamide derivatives, Tylenol  [acetaminophen ], and Cat dander    Review of Systems   Review of Systems  Physical Exam Updated Vital Signs BP (!) 149/109   Pulse 86   Temp (!) 97.5 F (36.4 C) (Oral)   Resp 15   Wt 106.7 kg Comment: Wt from 09/2023  SpO2 100%   BMI 35.77 kg/m  Physical Exam Vitals and nursing note reviewed.  Constitutional:      General: He is not in acute distress.    Appearance: He is well-developed.  HENT:     Head: Normocephalic and atraumatic.     Mouth/Throat:     Mouth: Mucous membranes are moist.  Eyes:     General: Vision grossly intact. Gaze aligned appropriately.     Extraocular Movements: Extraocular movements intact.     Conjunctiva/sclera: Conjunctivae normal.  Cardiovascular:     Rate and Rhythm: Normal rate and regular rhythm.     Pulses: Normal pulses.     Heart sounds: Normal heart sounds, S1 normal and S2 normal. No murmur heard.    No friction rub. No gallop.  Pulmonary:     Effort: Pulmonary effort is normal. No respiratory distress.     Breath sounds: Normal breath sounds.  Abdominal:     Palpations: Abdomen is soft.     Tenderness: There is no abdominal tenderness. There is no guarding or rebound.     Hernia: No hernia is present.  Musculoskeletal:        General: No swelling.     Cervical back: Full passive range of motion without pain, normal range of motion and neck supple. No pain with movement, spinous process tenderness or muscular tenderness. Normal range of motion.     Right lower leg: No  edema.     Left lower leg: No edema.  Skin:    General: Skin is warm and dry.     Capillary Refill: Capillary refill takes less than 2 seconds.     Findings: No ecchymosis, erythema, lesion or wound.  Neurological:     Mental Status: He is alert and oriented to person, place, and time.     GCS: GCS eye subscore is 4. GCS verbal subscore is 5. GCS motor subscore is 6.     Cranial Nerves: Cranial nerves 2-12 are intact.     Sensory: Sensation is intact.     Motor: Motor function is intact. No weakness or abnormal muscle tone.  Coordination: Coordination is intact.  Psychiatric:        Mood and Affect: Mood normal.        Speech: Speech normal.        Behavior: Behavior normal.     ED Results / Procedures / Treatments   Labs (all labs ordered are listed, but only abnormal results are displayed) Labs Reviewed  BASIC METABOLIC PANEL WITH GFR - Abnormal; Notable for the following components:      Result Value   Glucose, Bld 111 (*)    All other components within normal limits  PROTIME-INR - Abnormal; Notable for the following components:   Prothrombin Time 27.3 (*)    INR 2.5 (*)    All other components within normal limits  CBC    EKG None  Radiology DG Chest 2 View Result Date: 10/29/2023 CLINICAL DATA:  Cough. EXAM: CHEST - 2 VIEW COMPARISON:  May 14, 2023. FINDINGS: Stable cardiomediastinal silhouette. Status post transcatheter aortic valve repair. Left-sided pacemaker is unchanged. No acute pulmonary disease is noted. Bony thorax is unremarkable. IMPRESSION: No active cardiopulmonary disease. Electronically Signed   By: Rosalene Colon M.D.   On: 10/29/2023 18:21    Procedures Procedures    Medications Ordered in ED Medications  fosPHENYtoin (CEREBYX) 1,500 mg PE in sodium chloride  0.9 % 50 mL IVPB (has no administration in time range)  morphine  (PF) 4 MG/ML injection 4 mg (4 mg Intravenous Given 10/30/23 0312)  ondansetron  (ZOFRAN ) injection 4 mg (4 mg  Intravenous Given 10/30/23 1610)    ED Course/ Medical Decision Making/ A&P                                 Medical Decision Making Amount and/or Complexity of Data Reviewed External Data Reviewed: notes.    Details: Dr. Festus Hubert 2021 - diagnosed R trigeminal neuralgia, ordered MRI brain w and w/o (not done) and trileptal  (not taking) Labs: ordered. Radiology: ordered.  Risk Prescription drug management.  Differential diagnosis considered includes, but not limited to:  Trigeminal neuralgia; otitis externa; otitis media; pharyngitis; peritonsillar abscess  Patient presents with worsening right facial pain, throat pain.  Patient with previously diagnosed trigeminal neuralgia, seen by neurology in 2021.  At that time he was to receive MRI of brain and start Lamictal.  Patient did not do either because he was concerned about the effects of an antiepileptic on his heart.  He then got COVID and had complications and has been lost to follow-up.  Patient with a history of taking Lyrica  for 1 or 2 days at a time with improvement of his symptoms.  He has not been taking it daily.  For the last 3 weeks, however, he feels like he did not respond.  Oropharyngeal examination today is normal.  No signs of abscess or other pathology.  Patient with symptoms of right facial pain, preauricular pain, some improvement with pressing in the preauricular area.  This does seem consistent with his known history of trigeminal neuralgia.  Patient still hesitant to take daily medication.  He was given Dilaudid  and has had improved pain.  Will give a load of fosphenytoin.  He does not want to take daily Dilantin.  Will refer back to his neurologist for repeat evaluation.        Final Clinical Impression(s) / ED Diagnoses Final diagnoses:  Trigeminal neuralgia of right side of face    Rx / DC Orders ED  Discharge Orders          Ordered    Ambulatory referral to Neurology       Comments: An appointment is  requested in approximately: 4 weeks   10/30/23 0344              Ballard Bongo, MD 10/30/23 [phone]

## 2023-11-02 NOTE — Progress Notes (Unsigned)
  Electrophysiology Office Note:   ID:  Robert [name], DOB 01-16-1951, MRN [id number]  Primary Cardiologist: Sheryle Donning, MD Electrophysiologist: Ardeen Kohler, MD  {Click to update primary MD,subspecialty MD or APP then REFRESH:1}    History of Present Illness:   Robert [name] is a 73 y.o. 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   Since last being seen in our clinic the patient reports doing ***.  he denies chest pain, palpitations, dyspnea, PND, orthopnea, nausea, vomiting, dizziness, syncope, edema, weight gain, or early satiety.   Review of systems complete and found to be negative unless listed in HPI.   EP Information / Studies Reviewed:    EKG is not ordered today. EKG from 08/21/2023 reviewed which showed VP with AF underlying.        PPM Interrogation-  reviewed in detail today,  See PACEART report.  Arrhythmia/Device History MDT CRT-P implanted 04/2023 for LBBB and chronic systolic CHF BP in Left Arm ONLY   Physical Exam:   VS:  There were no vitals taken for this visit.   Wt Readings from Last 3 Encounters:  10/30/23 225 lb (102.1 kg)  10/30/23 235 lb 3.7 oz (106.7 kg)  09/28/23 235 lb 3.2 oz (106.7 kg)     GEN: No acute distress  NECK: No JVD; No carotid bruits CARDIAC: {EPRHYTHM:28826}, no murmurs, rubs, gallops RESPIRATORY:  Clear to auscultation without rales, wheezing or rhonchi  ABDOMEN: Soft, non-tender, non-distended EXTREMITIES:  {EDEMA LEVEL:28147::"No"} edema; No deformity   ASSESSMENT AND PLAN:    HFrecEF -> s/p CRT LBBB s/p [employer] PPM  EF has recovered now s/p CRT by Echo 08/2023 Normal PPM function See [facility] report No changes today  S/p TAVR Does still need the Echo in August, timed for post TAVR  Longstanding persistent AF BIV %  *** Deferring AV nodal ablation unless has further symptoms or worsening HF  {Click here to Review PMH, Prob List, Meds, Allergies, SHx, FHx  :1}   Disposition:   Follow up with {EPPROVIDERS:28135} {EPFOLLOW UP:28173}  Signed, Tylene Galla, PA-C

## 2023-11-03 ENCOUNTER — Ambulatory Visit: Payer: MEDICARE | Attending: Student | Admitting: Student

## 2023-11-03 ENCOUNTER — Encounter: Payer: Self-pay | Admitting: Student

## 2023-11-03 VITALS — BP 130/68 | HR 90 | Ht 68.0 in | Wt 227.0 lb

## 2023-11-03 DIAGNOSIS — Z952 Presence of prosthetic heart valve: Secondary | ICD-10-CM | POA: Diagnosis not present

## 2023-11-03 DIAGNOSIS — I4811 Longstanding persistent atrial fibrillation: Secondary | ICD-10-CM | POA: Diagnosis not present

## 2023-11-03 DIAGNOSIS — I5022 Chronic systolic (congestive) heart failure: Secondary | ICD-10-CM | POA: Insufficient documentation

## 2023-11-03 DIAGNOSIS — D6869 Other thrombophilia: Secondary | ICD-10-CM | POA: Diagnosis not present

## 2023-11-03 DIAGNOSIS — I447 Left bundle-branch block, unspecified: Secondary | ICD-10-CM | POA: Diagnosis not present

## 2023-11-03 LAB — CUP PACEART INCLINIC DEVICE CHECK
Battery Remaining Longevity: 104 mo
Battery Voltage: 3.02 V
Brady Statistic RA Percent Paced: 0 %
Brady Statistic RV Percent Paced: 84.25 %
Date Time Interrogation Session: 20250513084932
Implantable Lead Connection Status: 753985
Implantable Lead Connection Status: 753985
Implantable Lead Connection Status: 753985
Implantable Lead Implant Date: 20241121
Implantable Lead Implant Date: 20241121
Implantable Lead Implant Date: 20241121
Implantable Lead Location: 753858
Implantable Lead Location: 753859
Implantable Lead Location: 753860
Implantable Lead Model: 3830
Implantable Lead Model: 4598
Implantable Lead Model: 5076
Implantable Pulse Generator Implant Date: 20241121
Lead Channel Impedance Value: 1197 Ohm
Lead Channel Impedance Value: 1216 Ohm
Lead Channel Impedance Value: 1235 Ohm
Lead Channel Impedance Value: 361 Ohm
Lead Channel Impedance Value: 418 Ohm
Lead Channel Impedance Value: 456 Ohm
Lead Channel Impedance Value: 475 Ohm
Lead Channel Impedance Value: 494 Ohm
Lead Channel Impedance Value: 513 Ohm
Lead Channel Impedance Value: 532 Ohm
Lead Channel Impedance Value: 551 Ohm
Lead Channel Impedance Value: 836 Ohm
Lead Channel Impedance Value: 855 Ohm
Lead Channel Impedance Value: 893 Ohm
Lead Channel Pacing Threshold Amplitude: 0.75 V
Lead Channel Pacing Threshold Amplitude: 1.5 V
Lead Channel Pacing Threshold Pulse Width: 0.4 ms
Lead Channel Pacing Threshold Pulse Width: 0.6 ms
Lead Channel Sensing Intrinsic Amplitude: 1.75 mV
Lead Channel Sensing Intrinsic Amplitude: 1.875 mV
Lead Channel Sensing Intrinsic Amplitude: 13.625 mV
Lead Channel Sensing Intrinsic Amplitude: 15.5 mV
Lead Channel Setting Pacing Amplitude: 2 V
Lead Channel Setting Pacing Amplitude: 2.5 V
Lead Channel Setting Pacing Amplitude: 3.5 V
Lead Channel Setting Pacing Pulse Width: 0.4 ms
Lead Channel Setting Pacing Pulse Width: 0.6 ms
Lead Channel Setting Sensing Sensitivity: 1.2 mV
Zone Setting Status: 755011
Zone Setting Status: 755011

## 2023-11-03 NOTE — Patient Instructions (Signed)
 Medication Instructions:  Your physician recommends that you continue on your current medications as directed. Please refer to the Current Medication list given to you today.  *If you need a refill on your cardiac medications before your next appointment, please call your pharmacy*  Lab Work: None ordered If you have labs (blood work) drawn today and your tests are completely normal, you will receive your results only by: MyChart Message (if you have MyChart) OR A paper copy in the mail If you have any lab test that is abnormal or we need to change your treatment, we will call you to review the results.  Follow-Up: At [facility], you and your health needs are our priority.  As part of our continuing mission to provide you with exceptional heart care, our providers are all part of one team.  This team includes your primary Cardiologist (physician) and Advanced Practice Providers or APPs (Physician Assistants and Nurse Practitioners) who all work together to provide you with the care you need, when you need it.  Your next appointment:   6 month(s)  Provider:   Ardeen Kohler, MD

## 2023-11-04 ENCOUNTER — Ambulatory Visit: Payer: Self-pay | Admitting: Cardiology

## 2023-11-12 ENCOUNTER — Encounter: Payer: Self-pay | Admitting: Internal Medicine

## 2023-11-13 ENCOUNTER — Ambulatory Visit (INDEPENDENT_AMBULATORY_CARE_PROVIDER_SITE_OTHER): Payer: MEDICARE

## 2023-11-13 DIAGNOSIS — Z7901 Long term (current) use of anticoagulants: Secondary | ICD-10-CM

## 2023-11-13 DIAGNOSIS — I447 Left bundle-branch block, unspecified: Secondary | ICD-10-CM

## 2023-11-13 LAB — CUP PACEART REMOTE DEVICE CHECK
Battery Remaining Longevity: 102 mo
Battery Voltage: 3.02 V
Brady Statistic RA Percent Paced: 0 %
Brady Statistic RV Percent Paced: 81.81 %
Date Time Interrogation Session: 20250522223416
Implantable Lead Connection Status: 753985
Implantable Lead Connection Status: 753985
Implantable Lead Connection Status: 753985
Implantable Lead Implant Date: 20241121
Implantable Lead Implant Date: 20241121
Implantable Lead Implant Date: 20241121
Implantable Lead Location: 753858
Implantable Lead Location: 753859
Implantable Lead Location: 753860
Implantable Lead Model: 3830
Implantable Lead Model: 4598
Implantable Lead Model: 5076
Implantable Pulse Generator Implant Date: 20241121
Lead Channel Impedance Value: 1197 Ohm
Lead Channel Impedance Value: 1235 Ohm
Lead Channel Impedance Value: 1254 Ohm
Lead Channel Impedance Value: 361 Ohm
Lead Channel Impedance Value: 418 Ohm
Lead Channel Impedance Value: 475 Ohm
Lead Channel Impedance Value: 475 Ohm
Lead Channel Impedance Value: 475 Ohm
Lead Channel Impedance Value: 513 Ohm
Lead Channel Impedance Value: 513 Ohm
Lead Channel Impedance Value: 551 Ohm
Lead Channel Impedance Value: 836 Ohm
Lead Channel Impedance Value: 855 Ohm
Lead Channel Impedance Value: 874 Ohm
Lead Channel Pacing Threshold Amplitude: 0.75 V
Lead Channel Pacing Threshold Amplitude: 1.5 V
Lead Channel Pacing Threshold Pulse Width: 0.4 ms
Lead Channel Pacing Threshold Pulse Width: 0.6 ms
Lead Channel Sensing Intrinsic Amplitude: 1.875 mV
Lead Channel Sensing Intrinsic Amplitude: 1.875 mV
Lead Channel Sensing Intrinsic Amplitude: 16.875 mV
Lead Channel Sensing Intrinsic Amplitude: 16.875 mV
Lead Channel Setting Pacing Amplitude: 2 V
Lead Channel Setting Pacing Amplitude: 2.5 V
Lead Channel Setting Pacing Amplitude: 3.5 V
Lead Channel Setting Pacing Pulse Width: 0.4 ms
Lead Channel Setting Pacing Pulse Width: 0.6 ms
Lead Channel Setting Sensing Sensitivity: 1.2 mV
Zone Setting Status: 755011
Zone Setting Status: 755011

## 2023-11-13 LAB — POCT INR: INR: 2.1 (ref 2.0–3.0)

## 2023-11-13 NOTE — Patient Instructions (Addendum)
 Pre visit review using our clinic review tool, if applicable. No additional management support is needed unless otherwise documented below in the visit note.  Continue 1/2 tablet daily except take 1 tablet on Monday, Wednesday and Friday. Recheck in 4 weeks.

## 2023-11-13 NOTE — Progress Notes (Signed)
 Pt in ER on 5/8 for trigeminal neuralgia of right side of face. Pt had an infusion of  fosphenytoin . Continue 1/2 tablet daily except take 1 tablet on Monday, Wednesday and Friday. Recheck in 4 weeks.

## 2023-11-15 ENCOUNTER — Ambulatory Visit: Payer: Self-pay | Admitting: Cardiology

## 2023-11-16 ENCOUNTER — Other Ambulatory Visit: Payer: Self-pay | Admitting: Internal Medicine

## 2023-11-16 MED ORDER — OXYCODONE HCL 10 MG PO TABS: 10.0000 mg | ORAL_TABLET | Freq: Four times a day (QID) | ORAL | 0 refills | Status: DC | PRN

## 2023-11-19 ENCOUNTER — Ambulatory Visit: Payer: MEDICARE | Admitting: Internal Medicine

## 2023-11-29 ENCOUNTER — Other Ambulatory Visit: Payer: Self-pay | Admitting: Medical Genetics

## 2023-11-30 ENCOUNTER — Ambulatory Visit (INDEPENDENT_AMBULATORY_CARE_PROVIDER_SITE_OTHER): Payer: MEDICARE | Admitting: Internal Medicine

## 2023-11-30 ENCOUNTER — Encounter: Payer: Self-pay | Admitting: Internal Medicine

## 2023-11-30 VITALS — BP 98/62 | HR 93 | Temp 98.2°F | Ht 68.0 in | Wt 216.0 lb

## 2023-11-30 DIAGNOSIS — Z86711 Personal history of pulmonary embolism: Secondary | ICD-10-CM

## 2023-11-30 DIAGNOSIS — I825Y3 Chronic embolism and thrombosis of unspecified deep veins of proximal lower extremity, bilateral: Secondary | ICD-10-CM

## 2023-11-30 DIAGNOSIS — E538 Deficiency of other specified B group vitamins: Secondary | ICD-10-CM

## 2023-11-30 DIAGNOSIS — Z7901 Long term (current) use of anticoagulants: Secondary | ICD-10-CM | POA: Diagnosis not present

## 2023-11-30 DIAGNOSIS — G500 Trigeminal neuralgia: Secondary | ICD-10-CM | POA: Diagnosis not present

## 2023-11-30 MED ORDER — OXYCODONE HCL 10 MG PO TABS: 10.0000 mg | ORAL_TABLET | Freq: Four times a day (QID) | ORAL | 0 refills | Status: DC | PRN

## 2023-11-30 NOTE — Progress Notes (Signed)
 Subjective:  Patient ID: Robert [name], male    DOB: 1951-04-03  Age: 73 y.o. MRN: [id number]  CC: Medical Management of Chronic Issues (2 MNTH F/U)   HPI [name] presents for R trigeminal neuralgia - can't eat much; losing wt (11 lbs). Not better F/u on chronic pain, LBP  Not taking oxcarbazepine   - fearing side effects   Outpatient Medications Prior to Visit  Medication Sig Dispense Refill   albuterol  (VENTOLIN  HFA) 108 (90 Base) MCG/ACT inhaler Inhale 2 puffs into the lungs every 6 (six) hours as needed for wheezing or shortness of breath. 6.7 g 0   allopurinol  (ZYLOPRIM ) 100 MG tablet Take 1 tablet (100 mg total) by mouth daily. 90 tablet 1   celecoxib  (CELEBREX ) 200 MG capsule Take 1 capsule (200 mg total) by mouth 2 (two) times daily. (Patient taking differently: Take 200 mg by mouth as needed for moderate pain (pain score 4-6).) 14 capsule 0   cetirizine  (ZYRTEC ) 10 MG tablet Take 1 tablet (10 mg total) by mouth daily. 90 tablet 3   Cholecalciferol (VITAMIN D3) 125 MCG (5000 UT) CAPS Take 1 capsule (5,000 Units total) by mouth daily. 30 capsule 3   colchicine  0.6 MG tablet TAKE 2 TABLETS BY MOUTH AS NEEDED FOR  GOUT  ATTACK  THEN  TAKE  ANOTHER  ONE  TABLET  IN  1  TO  2  HOURS.  DO  NOT  REPEAT  FOR  3  DAYS. 18 tablet 0   digoxin  (LANOXIN ) 0.125 MG tablet Take 1 tablet (0.125 mg total) by mouth daily. 90 tablet 3   doxycycline  (MONODOX ) 100 MG capsule Take 1 capsule (100 mg total) by mouth as directed. 1 hour before any dental work, including cleanings. 6 capsule 6   famotidine  (PEPCID ) 40 MG tablet Take 1 tablet by mouth once daily (Patient taking differently: Take 40 mg by mouth daily. Only when he takes Celebrex ) 30 tablet 5   faricimab -svoa (VABYSMO ) 6 MG/0.05ML SOLN intravitreal injection 6 mg by Intravitreal route once. Injections every 9 weeks     fish oil-omega-3 fatty acids  1000 MG capsule Take 1 g by mouth every morning.     furosemide  (LASIX ) 20 MG tablet  Take 1 tablet (20 mg total) by mouth 2 (two) times daily. 180 tablet 3   glucosamine-chondroitin 500-400 MG tablet Take 1 tablet by mouth every morning.     mupirocin ointment (BACTROBAN) 2 % Place 1 Application into the nose daily as needed (irritation).     nortriptyline  (PAMELOR ) 25 MG capsule Take 1 capsule (25 mg total) by mouth at bedtime. 90 capsule 3   predniSONE  (DELTASONE ) 5 MG tablet Take 1 tablet (5 mg total) by mouth daily with breakfast. 90 tablet 3   pregabalin  (LYRICA ) 75 MG capsule Take 1 capsule (75 mg total) by mouth 3 (three) times daily. 90 capsule 5   triamcinolone  cream (KENALOG ) 0.1 % Apply 1 Application topically 3 (three) times daily. 450 g 1   warfarin (COUMADIN ) 6 MG tablet TAKE ONE-HALF (1/2) TABLET DAILY EXCEPT TAKE 1 TABLET ON FRIDAYS OR AS DIRECTED BY ANTICOAGULATION CLINIC 60 tablet 1   Oxycodone  HCl 10 MG TABS Take 1 tablet (10 mg total) by mouth 4 (four) times daily as needed. 20 tablet 0   No facility-administered medications prior to visit.    ROS: Review of Systems  Constitutional:  Positive for fatigue and unexpected weight change. Negative for appetite change.  HENT:  Negative for congestion,  nosebleeds, sneezing, sore throat and trouble swallowing.   Eyes:  Negative for itching and visual disturbance.  Respiratory:  Negative for cough.   Cardiovascular:  Negative for chest pain, palpitations and leg swelling.  Gastrointestinal:  Negative for abdominal distention, blood in stool, diarrhea and nausea.  Genitourinary:  Negative for frequency and hematuria.  Musculoskeletal:  Negative for back pain, gait problem, joint swelling and neck pain.  Skin:  Negative for color change, rash and wound.  Neurological:  Positive for headaches. Negative for dizziness, tremors, speech difficulty and weakness.  Hematological:  Bruises/bleeds easily.  Psychiatric/Behavioral:  Negative for agitation, dysphoric mood, sleep disturbance and suicidal ideas. The patient is  not nervous/anxious.     Objective:  BP 98/62   Pulse 93   Temp 98.2 F (36.8 C) (Oral)   Ht 5\' 8"  (1.727 m)   Wt 216 lb (98 kg)   SpO2 94%   BMI 32.84 kg/m   BP Readings from Last 3 Encounters:  11/30/23 98/62  11/03/23 130/68  10/30/23 110/60    Wt Readings from Last 3 Encounters:  11/30/23 216 lb (98 kg)  11/03/23 227 lb (103 kg)  10/30/23 225 lb (102.1 kg)    Physical Exam Constitutional:      General: He is not in acute distress.    Appearance: He is well-developed. He is obese.     Comments: NAD  Eyes:     Conjunctiva/sclera: Conjunctivae normal.     Pupils: Pupils are equal, round, and reactive to light.  Neck:     Thyroid : No thyromegaly.     Vascular: No JVD.  Cardiovascular:     Rate and Rhythm: Normal rate and regular rhythm.     Heart sounds: Normal heart sounds. No murmur heard.    No friction rub. No gallop.  Pulmonary:     Effort: Pulmonary effort is normal. No respiratory distress.     Breath sounds: Normal breath sounds. No wheezing or rales.  Chest:     Chest wall: No tenderness.  Abdominal:     General: Bowel sounds are normal. There is no distension.     Palpations: Abdomen is soft. There is no mass.     Tenderness: There is no abdominal tenderness. There is no guarding or rebound.  Musculoskeletal:        General: Tenderness present. Normal range of motion.     Cervical back: Normal range of motion.     Right lower leg: No edema.     Left lower leg: No edema.  Lymphadenopathy:     Cervical: No cervical adenopathy.  Skin:    General: Skin is warm and dry.     Findings: No lesion or rash.  Neurological:     Mental Status: He is alert and oriented to person, place, and time.     Cranial Nerves: No cranial nerve deficit.     Motor: No abnormal muscle tone.     Coordination: Coordination normal.     Gait: Gait abnormal.     Deep Tendon Reflexes: Reflexes are normal and symmetric.  Psychiatric:        Behavior: Behavior normal.         Thought Content: Thought content normal.        Judgment: Judgment normal.     Lab Results  Component Value Date   WBC 6.8 10/29/2023   HGB 15.7 10/29/2023   HCT 45.7 10/29/2023   PLT 240 10/29/2023   GLUCOSE 111 (H) 10/29/2023  CHOL 191 03/17/2022   TRIG 168.0 (H) 03/17/2022   HDL 36.20 (L) 03/17/2022   LDLDIRECT 137.0 03/10/2017   LDLCALC 121 (H) 03/17/2022   ALT 20 09/28/2023   AST 26 09/28/2023   NA 138 10/29/2023   K 4.4 10/29/2023   CL 101 10/29/2023   CREATININE 0.80 10/29/2023   BUN 12 10/29/2023   CO2 28 10/29/2023   TSH 3.51 09/28/2023   PSA 0.09 (L) 03/30/2019   INR 2.1 11/13/2023   HGBA1C 5.8 11/12/2021    DG Chest 2 View Result Date: 10/29/2023 CLINICAL DATA:  Cough. EXAM: CHEST - 2 VIEW COMPARISON:  May 14, 2023. FINDINGS: Stable cardiomediastinal silhouette. Status post transcatheter aortic valve repair. Left-sided pacemaker is unchanged. No acute pulmonary disease is noted. Bony thorax is unremarkable. IMPRESSION: No active cardiopulmonary disease. Electronically Signed   By: Rosalene Colon M.D.   On: 10/29/2023 18:21    Assessment & Plan:   Problem List Items Addressed This Visit     DVT of leg (deep venous thrombosis) (HCC) - Primary   On Coumadin  Checking INR       B12 deficiency   On B12      History of pulmonary embolism   On Coumadin       Long term (current) use of anticoagulants   On Coumadin       Trigeminal neuralgia   Not better On Lyrica  75 mg tid Oxy 10 mg qid prn  Potential benefits of a lshort/ong term opioids use as well as potential risks (i.e. addiction risk, apnea etc) and complications (i.e. Somnolence, constipation and others) were explained to the patient and were aknowledged. F/u w/Dr Festus Hubert on 12/02/23 Not taking oxcarbazepine  - fearing side effects         Meds ordered this encounter  Medications   Oxycodone  HCl 10 MG TABS    Sig: Take 1 tablet (10 mg total) by mouth 4 (four) times daily as needed.     Dispense:  60 tablet    Refill:  0      Follow-up: No follow-ups on file.  Anitra Barn, MD

## 2023-11-30 NOTE — Assessment & Plan Note (Signed)
 On Coumadin

## 2023-11-30 NOTE — Assessment & Plan Note (Signed)
 On B12

## 2023-11-30 NOTE — Assessment & Plan Note (Addendum)
 Not better On Lyrica  75 mg tid Oxy 10 mg qid prn  Potential benefits of a lshort/ong term opioids use as well as potential risks (i.e. addiction risk, apnea etc) and complications (i.e. Somnolence, constipation and others) were explained to the patient and were aknowledged. F/u w/Dr Festus Hubert on 12/02/23 Not taking oxcarbazepine  - fearing side effects

## 2023-11-30 NOTE — Assessment & Plan Note (Signed)
 On Coumadin  Checking INR

## 2023-12-01 NOTE — Progress Notes (Unsigned)
 NEUROLOGY CONSULTATION NOTE  Robert [name] MRN: [id number] DOB: [birth year]  Referring provider: Adelaide Holy, MD Primary care provider: Adelaide Holy, MD  Reason for consult:  trigeminal neuralgia  Assessment/Plan:   Right sided trigeminal neuralgia  Check MRI of trigeminal nerves with and without contrast to evaluate for structural etiology Increase pregablin to 100mg  three times daily.   Follow up 6 months.  Ultimately, carbamazepine and oxcarbazepine  would be first line treatment.  However, they may affect his INR.  After conferring with Dr. Georgia Kipper, if the pregablin is revealed to be ineffective, we can change to carbamazepine and Dr. Georgia Kipper will more closely monitor his INR.   Subjective:  Robert [name] is a 73 year old male with COPD, CHF, a fib, s/p TAVR, history of DVT who presents for trigeminal neuralgia.  History supplemented by his accompanying wife and ED and referring provider's note.  Since 2014, he has had persistent dull non-throbbing headache across his forehead and aching right retro-orbital pain.  Note that heHe also reports a pressure in his right ear as well.  He also reports a sharp and burning pain from his jaw anterior to his right ear that radiates to the right cheek and down the right side of his jaw.  Cold wind aggravates it.  Pressing behind his right ear helps relieve the pain.  It is fairly persistent, occurring off and on but worse over the past few months.  Since 2014, he also reports right perioral numbness.  He was treated with prednisone  and Valtrex  late last year without improvement.  CT head without contrast from 03/30/2019 was personally reviewed and was unremarkable.  He went to the dentist where full upper and lower X-rays and exam were unremarkable.    He will have flare ups off and on.  He started having worsening episodes several months ago.  He now notes associated pain in the right side of this throat so severe that he  sometimes cannot swallow and struggles to eat.  It occurs off and on everyday.  He went to the ED on 10/29/2023 where he was treated with cocktail of IV fosphenytoin , morphine  and ondansetron  which helped symptoms for a day or two.  He was started on pregablin 75mg  three times daily and prescribed oxycodone  as needed.  He has a nephew with trigeminal neuralgia.  He also has four sisters with history of Bell's palsy.   Current analgesics:  oxycodone  Current antidepressant:  pregablin 50mg  three times daily Other medication:  Warfarin  Past antidepressant:  nortriptyline  Past antiepileptic:  gabapentin (makes him confused)     PAST MEDICAL HISTORY: Past Medical History:  Diagnosis Date   Anxiety    Asthma    no attack since acupuncture in 1990's   Atrial fibrillation (HCC)    CHF (congestive heart failure) (HCC)    Complication of anesthesia    SLOW TO WAKE UP / DIFFICULTY RESPONDING 2003, 3 days before could use left arm, wild/crazy sometimes   COPD (chronic obstructive pulmonary disease) (HCC)    Cyst of left kidney    benign   DVT (deep venous thrombosis) (HCC) 06/23/2001   right femoral artery from groin to knee   Edema    Right Leg w/ h/o DVT postphlebitic   H/O blood transfusion reaction 06/23/2002   FFP, extreme swelling, could not breath   History of pulmonary embolism 06/23/2001   both lungs   Hyperlipidemia    Knee pain    left   LBP (low back  pain)    Lung nodule    from 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, clear in 2012   Obesity    Osteoarthritis    Peripheral vascular disease (HCC)    poor circulation in  RT leg   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 Lorie Rook and Dr. Honey Lusty   Severe aortic stenosis    Spondylolisthesis    Warfarin anticoagulation    Wheezing    when laying on left side    PAST SURGICAL HISTORY: Past Surgical History:  Procedure Laterality Date    APPENDECTOMY  06/24/1991   Dr Linell Rhymes   BIV PACEMAKER INSERTION CRT-P N/A 05/14/2023   Procedure: BIV PACEMAKER INSERTION CRT-P;  Surgeon: Ardeen Kohler, MD;  Location[facility] INVASIVE CV LAB;  Service: Cardiovascular;  Laterality: N/A;   CATARACT EXTRACTION W/ INTRAOCULAR LENS IMPLANT Bilateral    May/june 2024   CHOLECYSTECTOMY  06/24/1991   FINGER SURGERY  06/23/2010   RT THUMB RECONSTRUCTION   HAND SURGERY Right    abcess   HEMORROIDECTOMY  06/24/1979   I & D KNEE WITH POLY EXCHANGE Left 04/04/2013   Procedure: IRRIGATION AND DEBRIDEMENT KNEE WITH POLY EXCHANGE AND INSERTION OF CEMENT BEADS;  Surgeon: Aurther Blue, MD;  Location: [facility] ORS;  Service: Orthopedics;  Laterality: Left;   INTRAOPERATIVE TRANSTHORACIC ECHOCARDIOGRAM N/A 01/06/2023   Procedure: INTRAOPERATIVE TRANSTHORACIC ECHOCARDIOGRAM;  Surgeon: Kyra Phy, MD;  Location: [facility] INVASIVE CV LAB;  Service: Open Heart Surgery;  Laterality: N/A;   KNEE ARTHROSCOPY  06/23/1988   RT KNEE   KNEE ARTHROSCOPY Right 06/23/2001   KNEE ARTHROSCOPY  06/23/1986   L KNEE   KNEE ARTHROSCOPY WITH MENISCAL REPAIR Left 10/22/2012   Procedure: LEFT KNEE ARTHROSCOPY PARTIAL MEDIAL AND LATERAL MENISCECTOMY AND DEBRIDEMENT, CHONDROPLASTY ;  Surgeon: Loel Ring, MD;  Location: [facility];  Service: Orthopedics;  Laterality: Left;   LUMBAR DISC SURGERY     LUMBAR FUSION  06/23/2001   Dr Mildred All   ORCHIECTOMY  06/23/1992   R post injury   RIGHT HEART CATH AND CORONARY ANGIOGRAPHY N/A 12/16/2022   Procedure: RIGHT HEART CATH AND CORONARY ANGIOGRAPHY;  Surgeon: Kyra Phy, MD;  Location: [facility] INVASIVE CV LAB;  Service: Cardiovascular;  Laterality: N/A;   TONSILLECTOMY  06/23/1965   TOTAL KNEE ARTHROPLASTY Left 03/21/2013   Procedure: LEFT TOTAL KNEE ARTHROPLASTY;  Surgeon: Aurther Blue, MD;  Location: [facility];  Service: Orthopedics;  Laterality: Left;   TRANSCATHETER AORTIC VALVE REPLACEMENT, TRANSFEMORAL Right 01/06/2023   Procedure:  Transcatheter Aortic Valve Replacement, Transfemoral;  Surgeon: Kyra Phy, MD;  Location: [facility] INVASIVE CV LAB;  Service: Open Heart Surgery;  Laterality: Right;    MEDICATIONS: Current Outpatient Medications on File Prior to Visit  Medication Sig Dispense Refill   albuterol  (VENTOLIN  HFA) 108 (90 Base) MCG/ACT inhaler Inhale 2 puffs into the lungs every 6 (six) hours as needed for wheezing or shortness of breath. 6.7 g 0   allopurinol  (ZYLOPRIM ) 100 MG tablet Take 1 tablet (100 mg total) by mouth daily. 90 tablet 1   celecoxib  (CELEBREX ) 200 MG capsule Take 1 capsule (200 mg total) by mouth 2 (two) times daily. (Patient taking differently: Take 200 mg by mouth as needed for moderate pain (pain score 4-6).) 14 capsule 0   cetirizine  (ZYRTEC ) 10 MG tablet Take 1 tablet (10 mg total) by mouth daily. 90 tablet 3   Cholecalciferol (VITAMIN D3) 125  MCG (5000 UT) CAPS Take 1 capsule (5,000 Units total) by mouth daily. 30 capsule 3   colchicine  0.6 MG tablet TAKE 2 TABLETS BY MOUTH AS NEEDED FOR  GOUT  ATTACK  THEN  TAKE  ANOTHER  ONE  TABLET  IN  1  TO  2  HOURS.  DO  NOT  REPEAT  FOR  3  DAYS. 18 tablet 0   digoxin  (LANOXIN ) 0.125 MG tablet Take 1 tablet (0.125 mg total) by mouth daily. 90 tablet 3   doxycycline  (MONODOX ) 100 MG capsule Take 1 capsule (100 mg total) by mouth as directed. 1 hour before any dental work, including cleanings. 6 capsule 6   famotidine  (PEPCID ) 40 MG tablet Take 1 tablet by mouth once daily (Patient taking differently: Take 40 mg by mouth daily. Only when he takes Celebrex ) 30 tablet 5   faricimab -svoa (VABYSMO ) 6 MG/0.05ML SOLN intravitreal injection 6 mg by Intravitreal route once. Injections every 9 weeks     fish oil-omega-3 fatty acids  1000 MG capsule Take 1 g by mouth every morning.     furosemide  (LASIX ) 20 MG tablet Take 1 tablet (20 mg total) by mouth 2 (two) times daily. 180 tablet 3   glucosamine-chondroitin 500-400 MG tablet Take 1 tablet by mouth every  morning.     mupirocin ointment (BACTROBAN) 2 % Place 1 Application into the nose daily as needed (irritation).     nortriptyline  (PAMELOR ) 25 MG capsule Take 1 capsule (25 mg total) by mouth at bedtime. 90 capsule 3   Oxycodone  HCl 10 MG TABS Take 1 tablet (10 mg total) by mouth 4 (four) times daily as needed. 60 tablet 0   predniSONE  (DELTASONE ) 5 MG tablet Take 1 tablet (5 mg total) by mouth daily with breakfast. 90 tablet 3   pregabalin  (LYRICA ) 75 MG capsule Take 1 capsule (75 mg total) by mouth 3 (three) times daily. 90 capsule 5   triamcinolone  cream (KENALOG ) 0.1 % Apply 1 Application topically 3 (three) times daily. 450 g 1   warfarin (COUMADIN ) 6 MG tablet TAKE ONE-HALF (1/2) TABLET DAILY EXCEPT TAKE 1 TABLET ON FRIDAYS OR AS DIRECTED BY ANTICOAGULATION CLINIC 60 tablet 1   No current facility-administered medications on file prior to visit.    ALLERGIES: Allergies  Allergen Reactions   Cardizem  [Diltiazem ] Anaphylaxis   Cat Hair Extract Itching   Lopressor  [Metoprolol  Tartrate] Anaphylaxis    Asthmatic issues    Sulfa Antibiotics Hives and Swelling   Amiodarone  Swelling    Hand swelling   Anoro Ellipta  [Umeclidinium-Vilanterol]     Too much mucus   Breo Ellipta [Fluticasone  Furoate-Vilanterol]     Too much mucus   Buspar  [Buspirone ]     Red rash   Entresto  [Sacubitril-Valsartan] Swelling   Exalgo  [Hydromorphone  Hcl] Nausea Only    Sick and nauseated   Gabapentin     Made pt feel out of it    Hydrocodone-Acetaminophen  Nausea Only   Penicillins Hives    Tolerated Cefazolin  04/07/13, S.Elvan Hamel, PharmD   Red Dye     Welps and rashes   Sulfonamide Derivatives Hives and Swelling   Tylenol  [Acetaminophen ]     Liver issues   Cat Dander Itching    FAMILY HISTORY: Family History  Problem Relation Age of Onset   Heart disease Mother        CABG at age 80   Hyperlipidemia Other    Hypertension Other    Parkinsonism Other    Heart disease Sister  Objective:   Blood pressure 108/73, pulse 92, height 5' 8 (1.727 m), weight 217 lb (98.4 kg), SpO2 96%. General: No acute distress.  Patient appears well-groomed.   Head:  Normocephalic/atraumatic Eyes:  fundi examined but not visualized Neck: supple, no paraspinal tenderness, full range of motion Heart: regular rate and rhythm Neurological Exam: Mental status: alert and oriented to person, place, and time, speech fluent and not dysarthric, language intact. Cranial nerves: CN I: not tested CN II: pupils equal, round and reactive to light, visual fields intact CN III, IV, VI:  full range of motion, no nystagmus, no ptosis CN V: facial sensation reduced on right V1-V3. CN VII: upper and lower face symmetric CN VIII: hearing intact CN IX, X: gag intact, uvula midline CN XI: sternocleidomastoid and trapezius muscles intact CN XII: tongue midline Bulk & Tone: normal, no fasciculations. Motor:  muscle strength 5/5 throughout Sensation:  Pinprick and vibratory sensation reduced in toes. Deep Tendon Reflexes:  2+ throughout,  toes downgoing.   Finger to nose testing:  Without dysmetria.   Gait:  Normal station and stride.  Romberg negative.    Thank you for allowing me to take part in the care of this patient.  Janne Members, DO  CC: Adelaide Holy, MD

## 2023-12-02 ENCOUNTER — Other Ambulatory Visit (HOSPITAL_COMMUNITY)
Admission: RE | Admit: 2023-12-02 | Discharge: 2023-12-02 | Disposition: A | Discharge status: Home / Self Care | Payer: Self-pay | Source: Ambulatory Visit | Attending: Medical Genetics | Admitting: Medical Genetics

## 2023-12-02 ENCOUNTER — Ambulatory Visit (INDEPENDENT_AMBULATORY_CARE_PROVIDER_SITE_OTHER): Payer: MEDICARE | Admitting: Neurology

## 2023-12-02 ENCOUNTER — Encounter: Payer: Self-pay | Admitting: Neurology

## 2023-12-02 VITALS — BP 108/73 | HR 92 | Ht 68.0 in | Wt 217.0 lb

## 2023-12-02 DIAGNOSIS — G500 Trigeminal neuralgia: Secondary | ICD-10-CM | POA: Diagnosis not present

## 2023-12-02 MED ORDER — PREGABALIN 100 MG PO CAPS: 100.0000 mg | ORAL_CAPSULE | Freq: Three times a day (TID) | ORAL | 5 refills | Status: AC

## 2023-12-02 NOTE — Patient Instructions (Signed)
 Increase pregablin to 100mg  three times daily. If no improvement in 6 weeks, contact me Check MRI of trigeminal nerves with and without contrast. Follow up  6 months.

## 2023-12-10 ENCOUNTER — Other Ambulatory Visit: Payer: Self-pay

## 2023-12-10 ENCOUNTER — Telehealth: Payer: Self-pay | Admitting: Neurology

## 2023-12-10 DIAGNOSIS — G500 Trigeminal neuralgia: Secondary | ICD-10-CM

## 2023-12-10 NOTE — Telephone Encounter (Signed)
 Order changed.

## 2023-12-10 NOTE — Telephone Encounter (Signed)
 Pt. Needs new order at [facility] has Pace maker for MRI

## 2023-12-11 ENCOUNTER — Ambulatory Visit: Payer: MEDICARE

## 2023-12-11 DIAGNOSIS — Z7901 Long term (current) use of anticoagulants: Secondary | ICD-10-CM | POA: Diagnosis not present

## 2023-12-11 LAB — POCT INR: INR: 2.8 (ref 2.0–3.0)

## 2023-12-11 NOTE — Patient Instructions (Addendum)
 Pre visit review using our clinic review tool, if applicable. No additional management support is needed unless otherwise documented below in the visit note.  Continue 1/2 tablet daily except take 1 tablet on Monday, Wednesday and Friday. Recheck in 4 weeks.

## 2023-12-11 NOTE — Progress Notes (Cosign Needed)
 Lyrica  dose was increased to 100 mg TID. Continue 1/2 tablet daily except take 1 tablet on Monday, Wednesday and Friday. Recheck in 4 weeks.

## 2023-12-12 LAB — GENECONNECT MOLECULAR SCREEN: Genetic Analysis Overall Interpretation: NEGATIVE

## 2023-12-17 NOTE — Progress Notes (Signed)
 Called patients [employer] and was informed that no PA is needed for CPT [phone]. Call [phone]

## 2023-12-18 ENCOUNTER — Telehealth: Payer: Self-pay | Admitting: Internal Medicine

## 2023-12-18 NOTE — Telephone Encounter (Signed)
 Copied from CRM [id number]. Topic: Clinical - Request for Lab/Test Order >> Dec 15, 2023  9:47 AM [name] wrote: Reason for CRM: [name] with [facility] is calling to see if we received a requisition for lab that was faxed on 06/19. She will refax it today as well. >> Dec 18, 2023  9:32 AM Antwanette L wrote: [name] from [facility] is calling because a lab order was sent on 6/25. Please have Dr. Garald to sign, date, and fax order by the end of 12/18/23. Fax number is  [phone] >> Dec 16, 2023 11:02 AM Lavanda BIRCH wrote: [name] with [facility] calling to verify that fax was received. Either call or review and fax back - time sensitive request, please sign today if possible. Phone #[phone]

## 2023-12-18 NOTE — Telephone Encounter (Signed)
 Paperwork was given to PCP

## 2023-12-21 ENCOUNTER — Ambulatory Visit: Payer: Self-pay

## 2023-12-21 NOTE — Telephone Encounter (Signed)
 Copied from CRM [id number]. Topic: Clinical - Lab/Test Results >> Dec 21, 2023 10:31 AM Cleave MATSU wrote: Reason for CRM: lab order requistion form was sent over from [facility] wants to know if those can be signed and sent back over today please  Fax- [phone] send it back to [facility] please.

## 2023-12-21 NOTE — Progress Notes (Signed)
 Remote pacemaker transmission.

## 2023-12-21 NOTE — Telephone Encounter (Signed)
 Reason for CRM: lab order requistion form was sent over from [facility] wants to know if those can be signed and sent back over today please

## 2023-12-24 ENCOUNTER — Ambulatory Visit: Payer: Self-pay

## 2023-12-24 ENCOUNTER — Ambulatory Visit: Payer: MEDICARE | Admitting: Internal Medicine

## 2023-12-24 VITALS — BP 116/68 | HR 101 | Temp 98.1°F | Ht 68.0 in | Wt 209.6 lb

## 2023-12-24 DIAGNOSIS — S81801A Unspecified open wound, right lower leg, initial encounter: Secondary | ICD-10-CM | POA: Diagnosis not present

## 2023-12-24 DIAGNOSIS — I509 Heart failure, unspecified: Secondary | ICD-10-CM | POA: Diagnosis not present

## 2023-12-24 MED ORDER — DOXYCYCLINE HYCLATE 100 MG PO TABS: 100.0000 mg | ORAL_TABLET | Freq: Two times a day (BID) | ORAL | 0 refills | Status: AC

## 2023-12-24 NOTE — Assessment & Plan Note (Signed)
 Doing well after TAVR On Entresto  Avoid heat!

## 2023-12-24 NOTE — Telephone Encounter (Signed)
 FYI Only or Action Required?: FYI only for provider.  Patient was last seen in primary care on 11/30/2023 by [name], Karlynn GAILS, MD. Called Nurse Triage reporting Leg Swelling. Symptoms began several days ago. Interventions attempted: none - wrapped legNothing. Symptoms are: gradually worsening. Pt injured leg. Leg is swollen. Leg has a 2-3 inch wound that may be infected.  Triage Disposition: See Physician Within 24 Hours  Patient/caregiver understands and will follow disposition?: Yes                     Copied from CRM [id number]. Topic: Clinical - Red Word Triage >> Dec 24, 2023 11:36 AM [name] wrote: Red Word that prompted transfer to Nurse Triage: Patient stated that one of his  legs are swollen and inflamed. He said that it is red and puffy. Patient stated that this is the leg that had the blood clot. Reason for Disposition  Large swelling or bruise > 2 inches (5 cm)  Answer Assessment - Initial Assessment Questions 1. MECHANISM: How did the injury happen? (e.g., twisting injury, direct blow)      Hit by a post 2. ONSET: When did the injury happen? (Minutes or hours ago)      Tuesday 3. LOCATION: Where is the injury located?      Rt leg 4. APPEARANCE of INJURY: What does the injury look like?  (e.g., deformity of leg)     2.5 - 3 inches just above ankle 6. SIZE: For cuts, bruises, or swelling, ask: How large is it? (e.g., inches or centimeters)      2-3 inches 7. PAIN: Is there pain? If Yes, ask: How bad is the pain?   What does it keep you from doing? (e.g., Scale 1-10; or mild, moderate, severe)   -  NONE: (0): no pain   -  MILD (1-3): doesn't interfere with normal activities    -  MODERATE (4-7): interferes with normal activities (e.g., work or school) or awakens from sleep, limping    -  SEVERE (8-10): excruciating pain, unable to do any normal activities, unable to walk     moderate  9. OTHER SYMPTOMS: Do you have any other symptoms?       Leg is quite swollen  Protocols used: Leg Injury-A-AH

## 2023-12-24 NOTE — Progress Notes (Signed)
 Subjective:  Patient ID: Robert [name], male    DOB: 1950/11/08  Age: 73 y.o. MRN: [id number]  CC: Edema (Right leg edema for past several days. Patient notes hitting across the bottom of right leg when working on chain link fence)   HPI Robert [name] presents for R leg wound - not getting better  Outpatient Medications Prior to Visit  Medication Sig Dispense Refill   albuterol  (VENTOLIN  HFA) 108 (90 Base) MCG/ACT inhaler Inhale 2 puffs into the lungs every 6 (six) hours as needed for wheezing or shortness of breath. 6.7 g 0   allopurinol  (ZYLOPRIM ) 100 MG tablet Take 1 tablet (100 mg total) by mouth daily. 90 tablet 1   cetirizine  (ZYRTEC ) 10 MG tablet Take 1 tablet (10 mg total) by mouth daily. 90 tablet 3   Cholecalciferol (VITAMIN D3) 125 MCG (5000 UT) CAPS Take 1 capsule (5,000 Units total) by mouth daily. 30 capsule 3   colchicine  0.6 MG tablet TAKE 2 TABLETS BY MOUTH AS NEEDED FOR  GOUT  ATTACK  THEN  TAKE  ANOTHER  ONE  TABLET  IN  1  TO  2  HOURS.  DO  NOT  REPEAT  FOR  3  DAYS. 18 tablet 0   digoxin  (LANOXIN ) 0.125 MG tablet Take 1 tablet (0.125 mg total) by mouth daily. 90 tablet 3   doxycycline  (MONODOX ) 100 MG capsule Take 1 capsule (100 mg total) by mouth as directed. 1 hour before any dental work, including cleanings. 6 capsule 6   famotidine  (PEPCID ) 40 MG tablet Take 1 tablet by mouth once daily (Patient taking differently: Take 40 mg by mouth daily. Only when he takes Celebrex ) 30 tablet 5   faricimab -svoa (VABYSMO ) 6 MG/0.05ML SOLN intravitreal injection 6 mg by Intravitreal route once. Injections every 9 weeks     fish oil-omega-3 fatty acids  1000 MG capsule Take 1 g by mouth every morning.     furosemide  (LASIX ) 20 MG tablet Take 1 tablet (20 mg total) by mouth 2 (two) times daily. 180 tablet 3   glucosamine-chondroitin 500-400 MG tablet Take 1 tablet by mouth every morning.     mupirocin ointment (BACTROBAN) 2 % Place 1 Application into the nose daily as needed  (irritation).     nortriptyline  (PAMELOR ) 25 MG capsule Take 1 capsule (25 mg total) by mouth at bedtime. 90 capsule 3   Oxycodone  HCl 10 MG TABS Take 1 tablet (10 mg total) by mouth 4 (four) times daily as needed. 60 tablet 0   predniSONE  (DELTASONE ) 5 MG tablet Take 1 tablet (5 mg total) by mouth daily with breakfast. 90 tablet 3   pregabalin  (LYRICA ) 100 MG capsule Take 1 capsule (100 mg total) by mouth 3 (three) times daily. 90 capsule 5   triamcinolone  cream (KENALOG ) 0.1 % Apply 1 Application topically 3 (three) times daily. 450 g 1   warfarin (COUMADIN ) 6 MG tablet TAKE ONE-HALF (1/2) TABLET DAILY EXCEPT TAKE 1 TABLET ON FRIDAYS OR AS DIRECTED BY ANTICOAGULATION CLINIC 60 tablet 1   celecoxib  (CELEBREX ) 200 MG capsule Take 1 capsule (200 mg total) by mouth 2 (two) times daily. (Patient not taking: Reported on 12/24/2023) 14 capsule 0   No facility-administered medications prior to visit.    ROS: Review of Systems  Constitutional:  Negative for appetite change, fatigue and unexpected weight change.  HENT:  Negative for congestion, nosebleeds, sneezing, sore throat and trouble swallowing.   Eyes:  Negative for itching and visual disturbance.  Respiratory:  Negative for cough.   Cardiovascular:  Negative for chest pain, palpitations and leg swelling.  Gastrointestinal:  Negative for abdominal distention, blood in stool, diarrhea and nausea.  Genitourinary:  Negative for frequency and hematuria.  Musculoskeletal:  Positive for back pain and gait problem. Negative for joint swelling and neck pain.  Skin:  Negative for rash.  Neurological:  Negative for dizziness, tremors, speech difficulty and weakness.  Psychiatric/Behavioral:  Negative for agitation, dysphoric mood and sleep disturbance. The patient is not nervous/anxious.     Objective:  BP 116/68   Pulse (!) 101   Temp 98.1 F (36.7 C)   Ht 5' 8 (1.727 m)   Wt 209 lb 9.6 oz (95.1 kg)   SpO2 97%   BMI 31.87 kg/m   BP  Readings from Last 3 Encounters:  12/24/23 116/68  12/02/23 108/73  11/30/23 98/62    Wt Readings from Last 3 Encounters:  12/24/23 209 lb 9.6 oz (95.1 kg)  12/02/23 217 lb (98.4 kg)  11/30/23 216 lb (98 kg)    Physical Exam Constitutional:      General: He is not in acute distress.    Appearance: He is well-developed.     Comments: NAD  Eyes:     Conjunctiva/sclera: Conjunctivae normal.     Pupils: Pupils are equal, round, and reactive to light.  Neck:     Thyroid : No thyromegaly.     Vascular: No JVD.  Cardiovascular:     Rate and Rhythm: Normal rate and regular rhythm.     Heart sounds: Normal heart sounds. No murmur heard.    No friction rub. No gallop.  Pulmonary:     Effort: Pulmonary effort is normal. No respiratory distress.     Breath sounds: Normal breath sounds. No wheezing or rales.  Chest:     Chest wall: No tenderness.  Abdominal:     General: Bowel sounds are normal. There is no distension.     Palpations: Abdomen is soft. There is no mass.     Tenderness: There is no abdominal tenderness. There is no guarding or rebound.  Musculoskeletal:        General: No tenderness. Normal range of motion.     Cervical back: Normal range of motion.     Right lower leg: Edema present.     Left lower leg: No edema.  Lymphadenopathy:     Cervical: No cervical adenopathy.  Skin:    General: Skin is warm and dry.     Findings: No rash.  Neurological:     Mental Status: He is alert and oriented to person, place, and time.     Cranial Nerves: No cranial nerve deficit.     Motor: No abnormal muscle tone.     Coordination: Coordination normal.     Gait: Gait normal.     Deep Tendon Reflexes: Reflexes are normal and symmetric.  Psychiatric:        Behavior: Behavior normal.        Thought Content: Thought content normal.        Judgment: Judgment normal.   Trace edema Rleg Wounds are clean   Lab Results  Component Value Date   WBC 6.8 10/29/2023   HGB 15.7  10/29/2023   HCT 45.7 10/29/2023   PLT 240 10/29/2023   GLUCOSE 111 (H) 10/29/2023   CHOL 191 03/17/2022   TRIG 168.0 (H) 03/17/2022   HDL 36.20 (L) 03/17/2022   LDLDIRECT 137.0 03/10/2017   LDLCALC  121 (H) 03/17/2022   ALT 20 09/28/2023   AST 26 09/28/2023   NA 138 10/29/2023   K 4.4 10/29/2023   CL 101 10/29/2023   CREATININE 0.80 10/29/2023   BUN 12 10/29/2023   CO2 28 10/29/2023   TSH 3.51 09/28/2023   PSA 0.09 (L) 03/30/2019   INR 2.8 12/11/2023   HGBA1C 5.8 11/12/2021    No results found.  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      Leg wound, right - Primary   Wounds dressed Bactroban oint bid Doxy x 10 d if worse         Meds ordered this encounter  Medications   doxycycline  (VIBRA -TABS) 100 MG tablet    Sig: Take 1 tablet (100 mg total) by mouth 2 (two) times daily.    Dispense:  20 tablet    Refill:  0      Follow-up: No follow-ups on file.  Marolyn Noel, MD

## 2023-12-24 NOTE — Telephone Encounter (Signed)
 Copied from CRM [id number]. Topic: Clinical - Lab/Test Results >> Dec 24, 2023 10:06 AM [name] wrote: Reason for CRM: lab order requistion form was sent over from [facility] wants to know if those can be signed and sent back over today please. Original request was sent on 12/15/23 and they haven't gotten a response.   Fax- [phone] send it back to [facility] please.  [name] can be reached at [phone]

## 2023-12-24 NOTE — Telephone Encounter (Signed)
 Okay.  Thanks.

## 2023-12-24 NOTE — Assessment & Plan Note (Signed)
 Wounds dressed Bactroban oint bid Doxy x 10 d if worse

## 2023-12-28 ENCOUNTER — Telehealth: Payer: Self-pay

## 2023-12-28 NOTE — Telephone Encounter (Signed)
 Copied from CRM [id number]. Topic: General - Inquiry >> Dec 28, 2023  2:15 PM [place] wrote: Reason for CRM: lab order requistion form sent  from [facility] on 6/24. They need to have the form sent back as soon as possible as it is going to expire soon   Fax- [phone]   [name] can be reached at [phone]

## 2023-12-30 NOTE — Telephone Encounter (Signed)
 Copied from CRM [id number]. Topic: General - Other >> Dec 30, 2023 10:44 AM Burnard DEL wrote: Reason for CRM: Reason for CRM: lab order requistion form sent  from [facility] on 6/24. They need to have the form sent back as soon as possible as it is going to expire on Friday 01/01/2024.She has called in several time requesting for this to be sent back.   Fax- [phone]   Robert [name] can be reached at [phone]

## 2023-12-30 NOTE — Telephone Encounter (Signed)
 I've tried to reach [place] at the number given and I been sent to VM... Please have her re-send paperwork with a STAT return on please as the orders that were fax for June have been sent out to scan.

## 2024-01-05 ENCOUNTER — Ambulatory Visit (INDEPENDENT_AMBULATORY_CARE_PROVIDER_SITE_OTHER): Payer: MEDICARE

## 2024-01-05 VITALS — Ht 68.0 in | Wt 209.0 lb

## 2024-01-05 DIAGNOSIS — Z7901 Long term (current) use of anticoagulants: Secondary | ICD-10-CM

## 2024-01-05 DIAGNOSIS — Z0000 Encounter for general adult medical examination without abnormal findings: Secondary | ICD-10-CM

## 2024-01-05 LAB — POCT INR: INR: 2.5 (ref 2.0–3.0)

## 2024-01-05 NOTE — Patient Instructions (Addendum)
 Pre visit review using our clinic review tool, if applicable. No additional management support is needed unless otherwise documented below in the visit note.  Continue 1/2 tablet daily except take 1 tablet on Monday, Wednesday and Friday. Recheck in 4 weeks.

## 2024-01-05 NOTE — Progress Notes (Addendum)
 Subjective:   Robert [name] is a 73 y.o. who presents for a Medicare Wellness preventive visit.  As a reminder, Annual Wellness Visits don't include a physical exam, and some assessments may be limited, especially if this visit is performed virtually. We may recommend an in-person follow-up visit with your provider if needed.  Visit Complete: Virtual I connected with  Robert [name] on 01/05/24 by a audio enabled telemedicine application and verified that I am speaking with the correct person using two identifiers.  Patient Location: Home  Provider Location: Home Office  I discussed the limitations of evaluation and management by telemedicine. The patient expressed understanding and agreed to proceed.  Vital Signs: Because this visit was a virtual/telehealth visit, some criteria may be missing or patient reported. Any vitals not documented were not able to be obtained and vitals that have been documented are patient reported.  VideoDeclined- This patient declined Librarian, academic. Therefore the visit was completed with audio only.  Persons Participating in Visit: Patient.  AWV Questionnaire: Yes: Patient Medicare AWV questionnaire was completed by the patient on 12/24/2023 and 12/31/2023; I have confirmed that all information answered by patient is correct and no changes since this date.  Cardiac Risk Factors include: advanced age (>58men, >4 women);male gender;dyslipidemia;Other (see comment), Risk factor comments: A-fib, CHF     Objective:    Today's Vitals   01/05/24 0840  Weight: 209 lb (94.8 kg)  Height: 5' 8 (1.727 m)   Body mass index is 31.78 kg/m.     01/05/2024    8:53 AM 12/02/2023   11:08 AM 05/14/2023    6:27 AM 01/27/2023    3:48 PM 01/06/2023    5:47 AM 12/16/2022    8:02 AM 01/20/2022    3:41 PM  Advanced Directives  Does Patient Have a Medical Advance Directive? Yes No Yes Yes Yes Yes Yes  Type of Special educational needs teacher of [place];Living will  Healthcare Power of [place];Living will Healthcare Power of [place];Living will Healthcare Power of [place];Living will Living will;Healthcare Power of Attorney Out of facility DNR (pink MOST or yellow form)  Does patient want to make changes to medical advance directive? No - Patient declined  No - Patient declined    No - Patient declined  Copy of Healthcare Power of Attorney in Chart? Yes - validated most recent copy scanned in chart (See row information)  No - copy requested No - copy requested No - copy requested    Would patient like information on creating a medical advance directive?  No - Patient declined       Pre-existing out of facility DNR order (yellow form or pink MOST form)       Pink MOST/Yellow Form most recent copy in chart - Physician notified to receive inpatient order    Current Medications (verified) Outpatient Encounter Medications as of 01/05/2024  Medication Sig   albuterol  (VENTOLIN  HFA) 108 (90 Base) MCG/ACT inhaler Inhale 2 puffs into the lungs every 6 (six) hours as needed for wheezing or shortness of breath.   allopurinol  (ZYLOPRIM ) 100 MG tablet Take 1 tablet (100 mg total) by mouth daily.   celecoxib  (CELEBREX ) 200 MG capsule Take 1 capsule (200 mg total) by mouth 2 (two) times daily.   cetirizine  (ZYRTEC ) 10 MG tablet Take 1 tablet (10 mg total) by mouth daily.   Cholecalciferol (VITAMIN D3) 125 MCG (5000 UT) CAPS Take 1 capsule (5,000 Units total) by mouth daily.  colchicine  0.6 MG tablet TAKE 2 TABLETS BY MOUTH AS NEEDED FOR  GOUT  ATTACK  THEN  TAKE  ANOTHER  ONE  TABLET  IN  1  TO  2  HOURS.  DO  NOT  REPEAT  FOR  3  DAYS.   digoxin  (LANOXIN ) 0.125 MG tablet Take 1 tablet (0.125 mg total) by mouth daily.   doxycycline  (MONODOX ) 100 MG capsule Take 1 capsule (100 mg total) by mouth as directed. 1 hour before any dental work, including cleanings.   doxycycline  (VIBRA -TABS) 100 MG tablet Take 1 tablet (100 mg total) by  mouth 2 (two) times daily.   famotidine  (PEPCID ) 40 MG tablet Take 1 tablet by mouth once daily (Patient taking differently: Take 40 mg by mouth daily. Only when he takes Celebrex )   faricimab -svoa (VABYSMO ) 6 MG/0.05ML SOLN intravitreal injection 6 mg by Intravitreal route once. Injections every 9 weeks   fish oil-omega-3 fatty acids  1000 MG capsule Take 1 g by mouth every morning.   furosemide  (LASIX ) 20 MG tablet Take 1 tablet (20 mg total) by mouth 2 (two) times daily.   glucosamine-chondroitin 500-400 MG tablet Take 1 tablet by mouth every morning.   mupirocin ointment (BACTROBAN) 2 % Place 1 Application into the nose daily as needed (irritation).   nortriptyline  (PAMELOR ) 25 MG capsule Take 1 capsule (25 mg total) by mouth at bedtime.   Oxycodone  HCl 10 MG TABS Take 1 tablet (10 mg total) by mouth 4 (four) times daily as needed.   predniSONE  (DELTASONE ) 5 MG tablet Take 1 tablet (5 mg total) by mouth daily with breakfast.   pregabalin  (LYRICA ) 100 MG capsule Take 1 capsule (100 mg total) by mouth 3 (three) times daily.   triamcinolone  cream (KENALOG ) 0.1 % Apply 1 Application topically 3 (three) times daily.   warfarin (COUMADIN ) 6 MG tablet TAKE ONE-HALF (1/2) TABLET DAILY EXCEPT TAKE 1 TABLET ON FRIDAYS OR AS DIRECTED BY ANTICOAGULATION CLINIC   No facility-administered encounter medications on file as of 01/05/2024.    Allergies (verified) Cardizem  [diltiazem ], Cat hair extract, Lopressor  [metoprolol  tartrate], Sulfa antibiotics, Amiodarone , Anoro ellipta  [umeclidinium-vilanterol], Breo ellipta [fluticasone  furoate-vilanterol], Buspar  [buspirone ], Entresto  [sacubitril-valsartan], Exalgo  [hydromorphone  hcl], Gabapentin, Hydrocodone-acetaminophen , Penicillins, Red dye, Sulfonamide derivatives, Tylenol  [acetaminophen ], and Cat dander   History: Past Medical History:  Diagnosis Date   Anxiety    Asthma    no attack since acupuncture in 1990's   Atrial fibrillation (HCC)    CHF  (congestive heart failure) (HCC)    Complication of anesthesia    SLOW TO WAKE UP / DIFFICULTY RESPONDING 2003, 3 days before could use left arm, wild/crazy sometimes   COPD (chronic obstructive pulmonary disease) (HCC)    Cyst of left kidney    benign   DVT (deep venous thrombosis) (HCC) 06/23/2001   right femoral artery from groin to knee   Edema    Right Leg w/ h/o DVT postphlebitic   H/O blood transfusion reaction 06/23/2002   FFP, extreme swelling, could not breath   History of pulmonary embolism 06/23/2001   both lungs   Hyperlipidemia    Knee pain    left   LBP (low back pain)    Lung nodule    from 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, clear in 2012   Obesity    Osteoarthritis    Peripheral vascular disease (HCC)    poor circulation in  RT leg   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 Wendel and Dr. Maryjane   Severe aortic stenosis    Spondylolisthesis    Warfarin anticoagulation    Wheezing    when laying on left side   Past Surgical History:  Procedure Laterality Date   APPENDECTOMY  06/24/1991   Dr Merrilyn   BIV PACEMAKER INSERTION CRT-P N/A 05/14/2023   Procedure: BIV PACEMAKER INSERTION CRT-P;  Surgeon: Kennyth Chew, MD;  Location[facility] INVASIVE CV LAB;  Service: Cardiovascular;  Laterality: N/A;   CATARACT EXTRACTION W/ INTRAOCULAR LENS IMPLANT Bilateral    May/june 2024   CHOLECYSTECTOMY  06/24/1991   FINGER SURGERY  06/23/2010   RT THUMB RECONSTRUCTION   HAND SURGERY Right    abcess   HEMORROIDECTOMY  06/24/1979   I & D KNEE WITH POLY EXCHANGE Left 04/04/2013   Procedure: IRRIGATION AND DEBRIDEMENT KNEE WITH POLY EXCHANGE AND INSERTION OF CEMENT BEADS;  Surgeon: Dempsey LULLA Moan, MD;  Location: [facility] ORS;  Service: Orthopedics;  Laterality: Left;   INTRAOPERATIVE TRANSTHORACIC ECHOCARDIOGRAM N/A 01/06/2023   Procedure: INTRAOPERATIVE TRANSTHORACIC ECHOCARDIOGRAM;  Surgeon: Wendel Lurena POUR, MD;  Location: [facility] INVASIVE CV LAB;  Service: Open Heart Surgery;  Laterality: N/A;   KNEE ARTHROSCOPY  06/23/1988   RT KNEE   KNEE ARTHROSCOPY Right 06/23/2001   KNEE ARTHROSCOPY  06/23/1986   L KNEE   KNEE ARTHROSCOPY WITH MENISCAL REPAIR Left 10/22/2012   Procedure: LEFT KNEE ARTHROSCOPY PARTIAL MEDIAL AND LATERAL MENISCECTOMY AND DEBRIDEMENT, CHONDROPLASTY ;  Surgeon: Reyes JAYSON Billing, MD;  Location: [facility] OR[facility];  Service: Orthopedics;  Laterality: Left;   LUMBAR DISC SURGERY     LUMBAR FUSION  06/23/2001   Dr Baird   ORCHIECTOMY  06/23/1992   R post injury   RIGHT HEART CATH AND CORONARY ANGIOGRAPHY N/A 12/16/2022   Procedure: RIGHT HEART CATH AND CORONARY ANGIOGRAPHY;  Surgeon: Wendel Lurena POUR, MD;  Location: [facility] INVASIVE CV LAB;  Service: Cardiovascular;  Laterality: N/A;   TONSILLECTOMY  06/23/1965   TOTAL KNEE ARTHROPLASTY Left 03/21/2013   Procedure: LEFT TOTAL KNEE ARTHROPLASTY;  Surgeon: Dempsey LULLA Moan, MD;  Location: [facility] ORS;  Service: Orthopedics;  Laterality: Left;   TRANSCATHETER AORTIC VALVE REPLACEMENT, TRANSFEMORAL Right 01/06/2023   Procedure: Transcatheter Aortic Valve Replacement, Transfemoral;  Surgeon: Wendel Lurena POUR, MD;  Location: [facility] INVASIVE CV LAB;  Service: Open Heart Surgery;  Laterality: Right;   Family History  Problem Relation Age of Onset   Heart disease Mother        CABG at age 38   Heart disease Sister    Migraines Sister    Migraines Sister    Parkinsonism Paternal Grandfather    Hyperlipidemia Other    Hypertension Other    Parkinsonism Other    Social History   Socioeconomic History   Marital status: Married    Spouse name: Newell   Number of children: 3   Years of education: Not on file   Highest education level: Bachelor's degree (e.g., BA, AB, BS)  Occupational History   Occupation: RETIRED/Disabled    Employer: DISABLED  Tobacco Use   Smoking status: Never   Smokeless tobacco: Never  Vaping Use   Vaping status: Never Used   Substance and Sexual Activity   Alcohol use: Never   Drug use: Never   Sexual activity: Yes  Other Topics Concern   Not on file  Social History Narrative   Right handed   One story home   Drinks caffeine coffee q am  Lives with wife and son lives there also.  3 dogs/2025   Social Drivers of Corporate investment banker Strain: Low Risk  (01/05/2024)   Overall Financial Resource Strain (CARDIA)    Difficulty of Paying Living Expenses: Not very hard  Food Insecurity: Patient Declined (01/05/2024)   Hunger Vital Sign    Worried About Running Out of Food in the Last Year: Patient declined    Ran Out of Food in the Last Year: Patient declined  Transportation Needs: No Transportation Needs (01/05/2024)   PRAPARE - Administrator, Civil Service (Medical): No    Lack of Transportation (Non-Medical): No  Physical Activity: Sufficiently Active (01/05/2024)   Exercise Vital Sign    Days of Exercise per Week: 7 days    Minutes of Exercise per Session: 30 min  Stress: No Stress Concern Present (01/05/2024)   [employer] of Occupational Health - Occupational Stress Questionnaire    Feeling of Stress: Not at all  Social Connections: Unknown (01/05/2024)   Social Connection and Isolation Panel    Frequency of Communication with Friends and Family: Once a week    Frequency of Social Gatherings with Friends and Family: Patient declined    Attends Religious Services: Patient declined    Database administrator or Organizations: Not on file    Attends Engineer, structural: Not on file    Marital Status: Married    Tobacco Counseling Counseling given: Not Answered    Clinical Intake:  Pre-visit preparation completed: Yes  Pain : No/denies pain     BMI - recorded: 31.78 Nutritional Status: BMI > 30  Obese Nutritional Risks: None Diabetes: No  Lab Results  Component Value Date   HGBA1C 5.8 11/12/2021   HGBA1C 6.1 04/04/2021   HGBA1C 6.0 12/21/2019      How often do you need to have someone help you when you read instructions, pamphlets, or other written materials from your doctor or pharmacy?: 1 - Never  Interpreter Needed?: No  Information entered by :: Desma Wilkowski, RMA   Activities of Daily Living     12/31/2023   11:15 AM 01/27/2023    3:44 PM  In your present state of health, do you have any difficulty performing the following activities:  Hearing? 0 0  Vision? 0 0  Difficulty concentrating or making decisions? 0 0  Walking or climbing stairs? 0 0  Dressing or bathing? 0 0  Doing errands, shopping? 0 0  Preparing Food and eating ? N N  Using the Toilet? N N  In the past six months, have you accidently leaked urine? N Y  Do you have problems with loss of bowel control? N N  Managing your Medications? N N  Managing your Finances? N N  Housekeeping or managing your Housekeeping? N N    Patient Care Team: [name], Karlynn GAILS, MD as PCP - General Lonni Slain, MD as PCP - Cardiology (Cardiology) Kennyth Chew, MD as PCP - Electrophysiology (Cardiology) Jude Harden GAILS, MD (Pulmonary Disease) Duwayne Purchase, MD (Orthopedic Surgery) Melodi Lerner, MD as Attending Physician (Orthopedic Surgery) Delford Maude BROCKS, MD as Consulting Physician (Cardiology) Kelsie Agent, MD (Inactive) as Consulting Physician (Cardiology) Skeet Juliene SAUNDERS, DO as Consulting Physician (Neurology) Erasmo Bernardino BRAVO, OD as Consulting Physician (Optometry) Russell, Paula E, MD as Consulting Physician (Ophthalmology) Lonni Slain, MD as Consulting Physician (Cardiology) Skeet Juliene SAUNDERS, DO as Consulting Physician (Neurology)  I have updated your Care Teams any recent Medical Services  you may have received from other providers in the past year.     Assessment:   This is a routine wellness examination for [facility].  Hearing/Vision screen Hearing Screening - Comments:: Denies hearing difficulties   Vision Screening - Comments:: Wears  eyeglasses/Dr. Estevan   Goals Addressed   None    Depression Screen     01/05/2024    8:55 AM 04/20/2023    2:47 PM 04/15/2023    2:10 PM 01/27/2023    3:46 PM 01/22/2023   12:02 PM 10/16/2022    9:39 AM 01/20/2022    3:47 PM  PHQ 2/9 Scores  PHQ - 2 Score 0 1 1 0 0 0 0  PHQ- 9 Score 0  4 5 5       Fall Risk     12/31/2023   11:15 AM 12/02/2023   11:08 AM 04/20/2023    2:47 PM 04/17/2023   12:16 PM 04/15/2023   12:30 PM  Fall Risk   Falls in the past year? 0 0 0 0 0  Number falls in past yr: 0 0 0 0 0  Injury with Fall? 0 0 0 0 0  Risk for fall due to :   No Fall Risks Impaired balance/gait;Impaired mobility;Orthopedic patient;Medication side effect Impaired balance/gait;Impaired mobility;Orthopedic patient;Medication side effect  Risk for fall due to: Comment   cane    Follow up Falls evaluation completed;Falls prevention discussed Falls evaluation completed Falls evaluation completed Falls evaluation completed Falls evaluation completed    MEDICARE RISK AT HOME:  Medicare Risk at Home Any stairs in or around the home?: (Patient-Rptd) Yes If so, are there any without handrails?: (Patient-Rptd) No Home free of loose throw rugs in walkways, pet beds, electrical cords, etc?: (Patient-Rptd) Yes Adequate lighting in your home to reduce risk of falls?: (Patient-Rptd) Yes Life alert?: (Patient-Rptd) No Use of a cane, walker or w/c?: (Patient-Rptd) No Grab bars in the bathroom?: (Patient-Rptd) Yes Shower chair or bench in shower?: (Patient-Rptd) No Elevated toilet seat or a handicapped toilet?: (Patient-Rptd) No  TIMED UP AND GO:  Was the test performed?  No  Cognitive Function: Declined/Normal: No cognitive concerns noted by patient or family. Patient alert, oriented, able to answer questions appropriately and recall recent events. No signs of memory loss or confusion.        01/27/2023    3:42 PM 01/20/2022    3:49 PM  6CIT Screen  What Year? 0 points 0 points  What month?  0 points 0 points  What time? 0 points 0 points  Count back from 20 0 points 0 points  Months in reverse 0 points 0 points  Repeat phrase 0 points 0 points  Total Score 0 points 0 points    Immunizations Immunization History  Administered Date(s) Administered   Fluad Quad(high Dose 65+) 03/21/2019   Influenza Split 03/22/2012   Influenza Whole 03/17/2008, 05/15/2009, 02/13/2010   Influenza, High Dose Seasonal PF 03/26/2018   Influenza,inj,Quad PF,6+ Mos 03/14/2013, 05/09/2014, 03/06/2015, 02/18/2016   Influenza-Unspecified 03/04/2017   Pneumococcal Conjugate-13 08/19/2013   Pneumococcal Polysaccharide-23 04/04/2008, 02/05/2018   Td 06/27/2002   Tdap 09/16/2012   Zoster, Live 06/06/2014    Screening Tests Health Maintenance  Topic Date Due   Zoster Vaccines- Shingrix (1 of 2) 09/21/1970   DTaP/Tdap/Td (3 - Td or Tdap) 09/17/2022   Medicare Annual Wellness (AWV)  01/27/2024   INFLUENZA VACCINE  01/22/2024   Pneumococcal Vaccine: 50+ Years  Completed   Hepatitis C Screening  Completed  Hepatitis B Vaccines  Aged Out   HPV VACCINES  Aged Out   Meningococcal B Vaccine  Aged Out   Colonoscopy  Discontinued   COVID-19 Vaccine  Discontinued    Health Maintenance  Health Maintenance Due  Topic Date Due   Zoster Vaccines- Shingrix (1 of 2) 09/21/1970   DTaP/Tdap/Td (3 - Td or Tdap) 09/17/2022   Medicare Annual Wellness (AWV)  01/27/2024   Health Maintenance Items Addressed: See Nurse Notes at the end of this note  Additional Screening:  Vision Screening: Recommended annual ophthalmology exams for early detection of glaucoma and other disorders of the eye. Would you like a referral to an eye doctor? No    Dental Screening: Recommended annual dental exams for proper oral hygiene  Community Resource Referral / Chronic Care Management: CRR required this visit?  No   CCM required this visit?  No   Plan:    I have personally reviewed and noted the following in the  patient's chart:   Medical and social history Use of alcohol, tobacco or illicit drugs  Current medications and supplements including opioid prescriptions. Patient is not currently taking opioid prescriptions. Functional ability and status Nutritional status Physical activity Advanced directives List of other physicians Hospitalizations, surgeries, and ER visits in previous 12 months Vitals Screenings to include cognitive, depression, and falls Referrals and appointments  In addition, I have reviewed and discussed with patient certain preventive protocols, quality metrics, and best practice recommendations. A written personalized care plan for preventive services as well as general preventive health recommendations were provided to patient.   Evalyne Cortopassi L Aurore Redinger, CMA   01/05/2024   After Visit Summary: (MyChart) Due to this being a telephonic visit, the after visit summary with patients personalized plan was offered to patient via MyChart   Notes: Patient is due for a Shingles and a tetanus vaccine.  He declines a colonoscopy.  He has no other concerns to address today.  Medical screening examination/treatment/procedure(s) were performed by non-physician practitioner and as supervising physician I was immediately available for consultation/collaboration.  I agree with above. Karlynn Noel, MD

## 2024-01-05 NOTE — Progress Notes (Addendum)
 Doxycycline , x 10 days, was prescribed on 7/3 for potential infection from cut on leg. Continue 1/2 tablet daily except take 1 tablet on Monday, Wednesday and Friday. Recheck in 4 weeks.  Medical screening examination/treatment/procedure(s) were performed by non-physician practitioner and as supervising physician I was immediately available for consultation/collaboration.  I agree with above. Karlynn Noel, MD

## 2024-01-05 NOTE — Patient Instructions (Signed)
 [name] , Thank you for taking time out of your busy schedule to complete your Annual Wellness Visit with me. I enjoyed our conversation and look forward to speaking with you again next year. I, as well as your care team,  appreciate your ongoing commitment to your health goals. Please review the following plan we discussed and let me know if I can assist you in the future. Your Game plan/ To Do List   Follow up Visits: Next Medicare AWV with our clinical staff: 01/05/2025.   Have you seen your provider in the last 6 months (3 months if uncontrolled diabetes)? Yes Next Office Visit with your provider: 01/11/2024.  Clinician Recommendations:  Aim for 30 minutes of exercise or brisk walking, 6-8 glasses of water, and 5 servings of fruits and vegetables each day. You are due for a tetanus vaccine and a Shingles vaccine and can that done at your local pharmacy.        This is a list of the screening recommended for you and due dates:  Health Maintenance  Topic Date Due   Zoster (Shingles) Vaccine (1 of 2) 09/21/1970   DTaP/Tdap/Td vaccine (3 - Td or Tdap) 09/17/2022   Medicare Annual Wellness Visit  01/27/2024   Flu Shot  01/22/2024   Pneumococcal Vaccine for age over 72  Completed   Hepatitis C Screening  Completed   Hepatitis B Vaccine  Aged Out   HPV Vaccine  Aged Out   Meningitis B Vaccine  Aged Out   Colon Cancer Screening  Discontinued   COVID-19 Vaccine  Discontinued    Advanced directives: (In Chart) A copy of your advanced directives are scanned into your chart should your provider ever need it. Advance Care Planning is important because it:  [x]  Makes sure you receive the medical care that is consistent with your values, goals, and preferences  [x]  It provides guidance to your family and loved ones and reduces their decisional burden about whether or not they are making the right decisions based on your wishes.  Follow the link provided in your after visit summary or read  over the paperwork we have mailed to you to help you started getting your Advance Directives in place. If you need assistance in completing these, please reach out to us  so that we can help you!  See attachments for Preventive Care and Fall Prevention Tips.

## 2024-01-08 ENCOUNTER — Other Ambulatory Visit (HOSPITAL_COMMUNITY): Payer: MEDICARE

## 2024-01-08 ENCOUNTER — Ambulatory Visit: Payer: MEDICARE

## 2024-01-11 ENCOUNTER — Encounter: Payer: Self-pay | Admitting: Internal Medicine

## 2024-01-11 ENCOUNTER — Ambulatory Visit (INDEPENDENT_AMBULATORY_CARE_PROVIDER_SITE_OTHER): Payer: MEDICARE | Admitting: Internal Medicine

## 2024-01-11 VITALS — BP 116/69 | HR 93 | Temp 98.3°F | Ht 68.0 in | Wt 208.0 lb

## 2024-01-11 DIAGNOSIS — R52 Pain, unspecified: Secondary | ICD-10-CM | POA: Insufficient documentation

## 2024-01-11 DIAGNOSIS — G8929 Other chronic pain: Secondary | ICD-10-CM

## 2024-01-11 DIAGNOSIS — I5022 Chronic systolic (congestive) heart failure: Secondary | ICD-10-CM | POA: Diagnosis not present

## 2024-01-11 DIAGNOSIS — I825Y3 Chronic embolism and thrombosis of unspecified deep veins of proximal lower extremity, bilateral: Secondary | ICD-10-CM | POA: Diagnosis not present

## 2024-01-11 DIAGNOSIS — M48061 Spinal stenosis, lumbar region without neurogenic claudication: Secondary | ICD-10-CM | POA: Diagnosis not present

## 2024-01-11 DIAGNOSIS — E538 Deficiency of other specified B group vitamins: Secondary | ICD-10-CM

## 2024-01-11 DIAGNOSIS — M545 Low back pain, unspecified: Secondary | ICD-10-CM

## 2024-01-11 DIAGNOSIS — Z952 Presence of prosthetic heart valve: Secondary | ICD-10-CM

## 2024-01-11 MED ORDER — OXYCODONE HCL 10 MG PO TABS: 10.0000 mg | ORAL_TABLET | Freq: Four times a day (QID) | ORAL | 0 refills | Status: DC | PRN

## 2024-01-11 MED ORDER — FUROSEMIDE 20 MG PO TABS: 20.0000 mg | ORAL_TABLET | Freq: Two times a day (BID) | ORAL | 1 refills | Status: AC

## 2024-01-11 NOTE — Assessment & Plan Note (Signed)
 New - MSK or a scar tissue pain R shoulder/chest wall pain where his pacemaker when he was drying himself w/a towel last night Will watch

## 2024-01-11 NOTE — Assessment & Plan Note (Signed)
 On B12

## 2024-01-11 NOTE — Assessment & Plan Note (Signed)
s/p TAVR  (01/06/23) - doing well

## 2024-01-11 NOTE — Assessment & Plan Note (Signed)
 Blue-Emu cream was recommended to use 2-3 times a day Using a Light Wave patch Oxy po prn w/caution  Potential benefits of a short/long term opioids use as well as potential risks (i.e. addiction risk, apnea etc) and complications (i.e. Somnolence, constipation and others) were explained to the patient and were aknowledged.

## 2024-01-11 NOTE — Progress Notes (Signed)
 Subjective:  Patient ID: Robert [name], male    DOB: 1950-11-05  Age: 73 y.o. MRN: [id number]  CC: Medical Management of Chronic Issues (6 WEEK F/U a/w discuss lasix  increase 2x a day... Pt states he feel as though he pulled something in his rt shoulder above Visual merchandiser )   HPI Robert [name] presents for R shoulder/chest wall pain where his pacemaker when he was drying himself w/a towel last night F/u LBP, anticoagulation  Outpatient Medications Prior to Visit  Medication Sig Dispense Refill   albuterol  (VENTOLIN  HFA) 108 (90 Base) MCG/ACT inhaler Inhale 2 puffs into the lungs every 6 (six) hours as needed for wheezing or shortness of breath. 6.7 g 0   allopurinol  (ZYLOPRIM ) 100 MG tablet Take 1 tablet (100 mg total) by mouth daily. 90 tablet 1   celecoxib  (CELEBREX ) 200 MG capsule Take 1 capsule (200 mg total) by mouth 2 (two) times daily. 14 capsule 0   cetirizine  (ZYRTEC ) 10 MG tablet Take 1 tablet (10 mg total) by mouth daily. 90 tablet 3   Cholecalciferol (VITAMIN D3) 125 MCG (5000 UT) CAPS Take 1 capsule (5,000 Units total) by mouth daily. 30 capsule 3   colchicine  0.6 MG tablet TAKE 2 TABLETS BY MOUTH AS NEEDED FOR  GOUT  ATTACK  THEN  TAKE  ANOTHER  ONE  TABLET  IN  1  TO  2  HOURS.  DO  NOT  REPEAT  FOR  3  DAYS. 18 tablet 0   digoxin  (LANOXIN ) 0.125 MG tablet Take 1 tablet (0.125 mg total) by mouth daily. 90 tablet 3   doxycycline  (MONODOX ) 100 MG capsule Take 1 capsule (100 mg total) by mouth as directed. 1 hour before any dental work, including cleanings. 6 capsule 6   doxycycline  (VIBRA -TABS) 100 MG tablet Take 1 tablet (100 mg total) by mouth 2 (two) times daily. 20 tablet 0   famotidine  (PEPCID ) 40 MG tablet Take 1 tablet by mouth once daily (Patient taking differently: Take 40 mg by mouth daily. Only when he takes Celebrex ) 30 tablet 5   faricimab -svoa (VABYSMO ) 6 MG/0.05ML SOLN intravitreal injection 6 mg by Intravitreal route once. Injections every 9 weeks     fish  oil-omega-3 fatty acids  1000 MG capsule Take 1 g by mouth every morning.     furosemide  (LASIX ) 20 MG tablet Take 1 tablet (20 mg total) by mouth 2 (two) times daily. 180 tablet 3   glucosamine-chondroitin 500-400 MG tablet Take 1 tablet by mouth every morning.     mupirocin ointment (BACTROBAN) 2 % Place 1 Application into the nose daily as needed (irritation).     nortriptyline  (PAMELOR ) 25 MG capsule Take 1 capsule (25 mg total) by mouth at bedtime. 90 capsule 3   Oxycodone  HCl 10 MG TABS Take 1 tablet (10 mg total) by mouth 4 (four) times daily as needed. 60 tablet 0   predniSONE  (DELTASONE ) 5 MG tablet Take 1 tablet (5 mg total) by mouth daily with breakfast. 90 tablet 3   pregabalin  (LYRICA ) 100 MG capsule Take 1 capsule (100 mg total) by mouth 3 (three) times daily. 90 capsule 5   triamcinolone  cream (KENALOG ) 0.1 % Apply 1 Application topically 3 (three) times daily. 450 g 1   warfarin (COUMADIN ) 6 MG tablet TAKE ONE-HALF (1/2) TABLET DAILY EXCEPT TAKE 1 TABLET ON FRIDAYS OR AS DIRECTED BY ANTICOAGULATION CLINIC 60 tablet 1   No facility-administered medications prior to visit.    ROS: Review of  Systems  Constitutional:  Negative for appetite change, fatigue and unexpected weight change.  HENT:  Negative for congestion, nosebleeds, sneezing, sore throat and trouble swallowing.   Eyes:  Negative for itching and visual disturbance.  Respiratory:  Negative for cough.   Cardiovascular:  Positive for chest pain. Negative for palpitations and leg swelling.  Gastrointestinal:  Negative for abdominal distention, blood in stool, diarrhea and nausea.  Genitourinary:  Negative for frequency and hematuria.  Musculoskeletal:  Positive for arthralgias and back pain. Negative for gait problem, joint swelling and neck pain.  Skin:  Negative for rash.  Neurological:  Negative for dizziness, tremors, speech difficulty and weakness.  Psychiatric/Behavioral:  Negative for agitation, dysphoric mood,  sleep disturbance and suicidal ideas. The patient is not nervous/anxious.     Objective:  BP 116/69   Pulse 93   Temp 98.3 F (36.8 C) (Oral)   Ht 5' 8 (1.727 m)   Wt 208 lb (94.3 kg)   SpO2 98%   BMI 31.63 kg/m   BP Readings from Last 3 Encounters:  01/11/24 116/69  12/24/23 116/68  12/02/23 108/73    Wt Readings from Last 3 Encounters:  01/11/24 208 lb (94.3 kg)  01/05/24 209 lb (94.8 kg)  12/24/23 209 lb 9.6 oz (95.1 kg)    Physical Exam Constitutional:      General: He is not in acute distress.    Appearance: He is well-developed.     Comments: NAD  Eyes:     Conjunctiva/sclera: Conjunctivae normal.     Pupils: Pupils are equal, round, and reactive to light.  Neck:     Thyroid : No thyromegaly.     Vascular: No JVD.  Cardiovascular:     Rate and Rhythm: Normal rate and regular rhythm.     Heart sounds: Normal heart sounds. No murmur heard.    No friction rub. No gallop.  Pulmonary:     Effort: Pulmonary effort is normal. No respiratory distress.     Breath sounds: Normal breath sounds. No wheezing or rales.  Chest:     Chest wall: No tenderness.  Abdominal:     General: Bowel sounds are normal. There is no distension.     Palpations: Abdomen is soft. There is no mass.     Tenderness: There is no abdominal tenderness. There is no guarding or rebound.  Musculoskeletal:        General: Tenderness present. Normal range of motion.     Cervical back: Normal range of motion.     Right lower leg: No edema.     Left lower leg: No edema.  Lymphadenopathy:     Cervical: No cervical adenopathy.  Skin:    General: Skin is warm and dry.     Findings: No rash.  Neurological:     Mental Status: He is alert and oriented to person, place, and time.     Cranial Nerves: No cranial nerve deficit.     Motor: No abnormal muscle tone.     Coordination: Coordination normal.     Gait: Gait normal.     Deep Tendon Reflexes: Reflexes are normal and symmetric.  Psychiatric:         Behavior: Behavior normal.        Thought Content: Thought content normal.        Judgment: Judgment normal.   R shoulder/cest wall painful where his pacemaker is  Lab Results  Component Value Date   WBC 6.8 10/29/2023   HGB 15.7 10/29/2023  HCT 45.7 10/29/2023   PLT 240 10/29/2023   GLUCOSE 111 (H) 10/29/2023   CHOL 191 03/17/2022   TRIG 168.0 (H) 03/17/2022   HDL 36.20 (L) 03/17/2022   LDLDIRECT 137.0 03/10/2017   LDLCALC 121 (H) 03/17/2022   ALT 20 09/28/2023   AST 26 09/28/2023   NA 138 10/29/2023   K 4.4 10/29/2023   CL 101 10/29/2023   CREATININE 0.80 10/29/2023   BUN 12 10/29/2023   CO2 28 10/29/2023   TSH 3.51 09/28/2023   PSA 0.09 (L) 03/30/2019   INR 2.5 01/05/2024   HGBA1C 5.8 11/12/2021    No results found.  Assessment & Plan:   Problem List Items Addressed This Visit     DVT of leg (deep venous thrombosis) (HCC) - Primary   On Coumadin  Checking INR       B12 deficiency   On B12      Low back pain   Blue-Emu cream was recommended to use 2-3 times a day Using a Light Wave patch Oxy po prn w/caution  Potential benefits of a short/long term opioids use as well as potential risks (i.e. addiction risk, apnea etc) and complications (i.e. Somnolence, constipation and others) were explained to the patient and were aknowledged.       Degenerative lumbar spinal stenosis   Oxycodone  prn  Potential benefits of a long term opioids use as well as potential risks (i.e. addiction risk, apnea etc) and complications (i.e. Somnolence, constipation and others) were explained to the patient and were aknowledged.       S/P TAVR (transcatheter aortic valve replacement)   s/p TAVR  (01/06/23) - doing well      Pain in pacemaker pocket   New - MSK or a scar tissue pain R shoulder/chest wall pain where his pacemaker when he was drying himself w/a towel last night Will watch         No orders of the defined types were placed in this encounter.      Follow-up: No follow-ups on file.  Marolyn Noel, MD

## 2024-01-11 NOTE — Assessment & Plan Note (Signed)
 On Coumadin  Checking INR

## 2024-01-11 NOTE — Assessment & Plan Note (Signed)
Oxycodone prn  Potential benefits of a long term opioids use as well as potential risks (i.e. addiction risk, apnea etc) and complications (i.e. Somnolence, constipation and others) were explained to the patient and were aknowledged. 

## 2024-01-12 ENCOUNTER — Encounter (HOSPITAL_COMMUNITY): Payer: Self-pay

## 2024-01-12 ENCOUNTER — Ambulatory Visit (HOSPITAL_COMMUNITY)
Admission: RE | Admit: 2024-01-12 | Discharge: 2024-01-12 | Disposition: A | Discharge status: Home / Self Care | Payer: MEDICARE | Source: Ambulatory Visit | Attending: Neurology | Admitting: Neurology

## 2024-01-12 DIAGNOSIS — G500 Trigeminal neuralgia: Secondary | ICD-10-CM | POA: Diagnosis not present

## 2024-01-12 DIAGNOSIS — J3489 Other specified disorders of nose and nasal sinuses: Secondary | ICD-10-CM | POA: Diagnosis not present

## 2024-01-12 MED ORDER — GADOBUTROL 1 MMOL/ML IV SOLN
9.0000 mL | Freq: Once | INTRAVENOUS | Status: AC | PRN
  Administered 2024-01-12: 9 mL via INTRAVENOUS

## 2024-01-14 DIAGNOSIS — H34831 Tributary (branch) retinal vein occlusion, right eye, with macular edema: Secondary | ICD-10-CM | POA: Diagnosis not present

## 2024-01-18 ENCOUNTER — Encounter: Payer: Self-pay | Admitting: Neurology

## 2024-01-18 DIAGNOSIS — Z79899 Other long term (current) drug therapy: Secondary | ICD-10-CM

## 2024-01-19 NOTE — Telephone Encounter (Signed)
LMOVM for patient to call the office.

## 2024-01-20 ENCOUNTER — Telehealth: Payer: Self-pay | Admitting: Neurology

## 2024-01-20 NOTE — Telephone Encounter (Signed)
 Pt. Calling back about MRI results

## 2024-01-21 NOTE — Telephone Encounter (Signed)
 See results note.

## 2024-01-21 NOTE — Addendum Note (Signed)
 Addended by: OZELL JESUSA PARAS on: 01/21/2024 02:23 PM   Modules accepted: Orders

## 2024-01-22 ENCOUNTER — Ambulatory Visit (HOSPITAL_BASED_OUTPATIENT_CLINIC_OR_DEPARTMENT_OTHER): Payer: MEDICARE

## 2024-01-22 DIAGNOSIS — Z952 Presence of prosthetic heart valve: Secondary | ICD-10-CM | POA: Diagnosis not present

## 2024-01-22 LAB — ECHOCARDIOGRAM COMPLETE
AR max vel: 1.94 cm2
AV Area VTI: 1.82 cm2
AV Area mean vel: 1.7 cm2
AV Mean grad: 8 mmHg
AV Peak grad: 14.3 mmHg
Ao pk vel: 1.89 m/s
Area-P 1/2: 2.96 cm2
Calc EF: 50.6 %
MV M vel: 4.74 m/s
MV Peak grad: 89.9 mmHg
S' Lateral: 4.08 cm
Single Plane A2C EF: 49.5 %
Single Plane A4C EF: 50.7 %

## 2024-01-25 ENCOUNTER — Other Ambulatory Visit: Payer: Self-pay | Admitting: Neurology

## 2024-01-25 ENCOUNTER — Ambulatory Visit: Payer: Self-pay | Admitting: Physician Assistant

## 2024-01-25 MED ORDER — OXCARBAZEPINE 150 MG PO TABS: ORAL_TABLET | ORAL | 0 refills | Status: DC

## 2024-01-25 NOTE — Addendum Note (Signed)
 Addended by: OZELL JESUSA PARAS on: 01/25/2024 08:08 AM   Modules accepted: Orders

## 2024-01-27 ENCOUNTER — Ambulatory Visit: Payer: Medicare Other

## 2024-01-27 ENCOUNTER — Encounter (HOSPITAL_BASED_OUTPATIENT_CLINIC_OR_DEPARTMENT_OTHER): Payer: Self-pay | Admitting: Cardiology

## 2024-01-27 ENCOUNTER — Ambulatory Visit (INDEPENDENT_AMBULATORY_CARE_PROVIDER_SITE_OTHER): Payer: MEDICARE | Admitting: Cardiology

## 2024-01-27 ENCOUNTER — Telehealth: Payer: Self-pay

## 2024-01-27 VITALS — BP 110/58 | HR 88 | Ht 68.0 in | Wt 208.0 lb

## 2024-01-27 DIAGNOSIS — Z86718 Personal history of other venous thrombosis and embolism: Secondary | ICD-10-CM | POA: Diagnosis not present

## 2024-01-27 DIAGNOSIS — I447 Left bundle-branch block, unspecified: Secondary | ICD-10-CM

## 2024-01-27 DIAGNOSIS — Z952 Presence of prosthetic heart valve: Secondary | ICD-10-CM | POA: Diagnosis not present

## 2024-01-27 DIAGNOSIS — I4811 Longstanding persistent atrial fibrillation: Secondary | ICD-10-CM

## 2024-01-27 DIAGNOSIS — I2510 Atherosclerotic heart disease of native coronary artery without angina pectoris: Secondary | ICD-10-CM | POA: Diagnosis not present

## 2024-01-27 DIAGNOSIS — D6869 Other thrombophilia: Secondary | ICD-10-CM

## 2024-01-27 DIAGNOSIS — Z7901 Long term (current) use of anticoagulants: Secondary | ICD-10-CM

## 2024-01-27 DIAGNOSIS — I428 Other cardiomyopathies: Secondary | ICD-10-CM | POA: Diagnosis not present

## 2024-01-27 NOTE — Progress Notes (Signed)
 Cardiology Office Note:  .    Date:  01/27/2024  ID:  Robert [name], DOB 02-01-1951, MRN [id number] PCP: Garald Karlynn GAILS, MD  [facility] Providers Cardiologist:  Shelda Bruckner, MD Electrophysiologist:  Fonda Kitty, MD     History of Present Illness: .    Robert [name] is a 73 y.o. 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  He was previously seen by cardiology in 04/2014 by Dr. Delford. His initial consult with me was on 04/23/18. Please see that note for full review of his history.  CV history: (Pre-procedure) Cath 12/16/22 showed mid LAD with 40% stenosis, and 20% stenosis of Ost RCA to proximal RCA, mild obstructive CAD.  TAVR 01/06/2023 34 mm Medtronic FX THV via the TF approach BiV device placed 05/14/23 for LBBB/CHF   Today: Heart rates have been 70s-80s, discussed severe biatrial enlargement, likelihood of this being permanent afib and planned for rate control.   Doing well post TAVR. No shortness of breath, limited by fatigue, which is more chronic  LE edema significantly resolved, not wearing compression stockings. Down 113 lbs with lifestyle changes. Tried stopping PM lasix  but had recurrent swelling, back to 20 mg BID dosing. No lightheadedness.  Denies chest pain, shortness of breath at rest or with normal exertion. No PND, orthopnea, or unexpected weight gain. No syncope or palpitations. ROS otherwise negative except as noted.    Studies Reviewed: SABRA         Physical Exam:    VS:  BP (!) 110/58 (BP Location: Right Arm, Patient Position: Sitting, Cuff Size: Large)   Pulse 88   Ht 5' 8 (1.727 m)   Wt 208 lb (94.3 kg)   BMI 31.63 kg/m    Wt Readings from Last 3 Encounters:  01/27/24 208 lb (94.3 kg)  01/11/24 208  lb (94.3 kg)  01/05/24 209 lb (94.8 kg)    GEN: Well nourished, well developed in no acute distress HEENT: Normal, moist mucous membranes NECK: No JVD CARDIAC: largely regular rhythm (paced), normal S1 and S2, no rubs or gallops. No murmur. VASCULAR: Radial and DP pulses 2+ bilaterally. No carotid bruits RESPIRATORY:  Clear to auscultation without rales, wheezing or rhonchi  ABDOMEN: Soft, non-tender, non-distended MUSCULOSKELETAL:  Ambulates independently SKIN: Warm and dry, minimal bilateral LE edema NEUROLOGIC:  Alert and oriented x 3. No focal neuro deficits noted. PSYCHIATRIC:  Normal affect   ASSESSMENT AND PLAN: .    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 Has been very active recently. -had side effects on entresto , does not want to retrial. He declined low dose ARB. Does not tolerate beta blockers. Declines SGLT2i or spironolactone -continue furosemide  20 mg BID -EF prior to TAVR 55-60% 10/2022, immediate post TAVR 50-55%, dropped to 30-35% 1 month post TAVR, after GDMT was 40-45% 04/2023, then 50-55% 01/2024   Permanent vs longstanding persistent atrial fibrillation -given limitations/sensitivities to medications, limited options. Tolerated amiodarone  as a rate control agent in the past, but had side effects of hand swelling and thyroid  abnormalities. Cannot tolerate diltiazem  or beta blockers. Declined tikosyn. Does not want cardioversion.  -has severe biatrial enlargement on echo, suspect he is in permanent afib -now followed by Dr. Kitty.  S/P CRT-P 04/2023 for LBBB/CHF -on digoxin  for rate control -CHA2DS2/VAS Stroke Risk Points=4  -on long term coumadin . Not interested in DOAC. Son had reaction to apixaban.   Hypertension:  -at goal today, only on furosemide . Discussed watching for lightheadedness, low blood pressures with weight loss   History of DVT/PE with  chronic venous insufficiency, on long term anticoagulation with coumadin :  -No changes, not interested in DOAC, continue coumadin  therapy.    Dyslipidemia Nonobstructive CAD Peripheral vascular disease -We have discussed at length options for management. Declines statin and other medications. He prefers to stay on OTC fish oil. Has significantly elevated ASCVD risk, below. Discussed that OTC fish oil not recommended from a cardiovascular perspective.   Obesity: BMI ~44->38-->31. Working on weight loss through lifestyle change.   CV risk counseling and prevention -recommend heart healthy/Mediterranean diet, with whole grains, fruits, vegetable, fish, lean meats, nuts, and olive oil. Limit salt. -recommend moderate walking, 3-5 times/week for 30-50 minutes each session. Aim for at least 150 minutes.week. Goal should be pace of 3 miles/hours, or walking 1.5 miles in 30 minutes -recommend avoidance of tobacco products. Avoid excess alcohol.  Dispo: March 2026 or sooner as needed  Shelda Bruckner, MD, PhD, [facility]  [facility]    Signed, Shelda Bruckner, MD

## 2024-01-27 NOTE — Patient Instructions (Signed)
 Medication Instructions:  Your physician recommends that you continue on your current medications as directed. Please refer to the Current Medication list given to you today.  *If you need a refill on your cardiac medications before your next appointment, please call your pharmacy*  Lab Work: NONE  Testing/Procedures: NONE  Follow-Up: At [facility], you and your health needs are our priority.  As part of our continuing mission to provide you with exceptional heart care, we have created designated Provider Care Teams.  These Care Teams include your primary Cardiologist (physician) and Advanced Practice Providers (APPs -  Physician Assistants and Nurse Practitioners) who all work together to provide you with the care you need, when you need it.  We recommend signing up for the patient portal called MyChart.  Sign up information is provided on this After Visit Summary.  MyChart is used to connect with patients for Virtual Visits (Telemedicine).  Patients are able to view lab/test results, encounter notes, upcoming appointments, etc.  Non-urgent messages can be sent to your provider as well.   To learn more about what you can do with MyChart, go to [email or web address].    Your next appointment:   IN [facility]   The format for your next appointment:   In Person  Provider:   DR CHRISTOPHER, CAITLIN W NP, OR [name] RAMAN NP

## 2024-01-27 NOTE — Telephone Encounter (Signed)
 Pt reports neurology changed his medication from Lyrica  to OXcarbazepine  (TRILEPTAL ) . Neither have any interaction with warfarin.  Pt reports he has had some epistasis, very small amount, that he reports is pink in color when blowing his nose. This has not worsened over the 2 weeks.  Pt is wondering if INR should be checked sooner than his now coumadin  clinic apt scheduled for 8/15. Advised to come in on 8/8 for INR check. Advised if any worsening of epistasis or he develops any new s/s of abnormal bruising or bleeding to go to ER. Pt verbalized understanding.

## 2024-01-29 ENCOUNTER — Ambulatory Visit (INDEPENDENT_AMBULATORY_CARE_PROVIDER_SITE_OTHER): Payer: MEDICARE

## 2024-01-29 DIAGNOSIS — Z7901 Long term (current) use of anticoagulants: Secondary | ICD-10-CM | POA: Diagnosis not present

## 2024-01-29 LAB — POCT INR: INR: 1.8 — AB (ref 2.0–3.0)

## 2024-01-29 MED ORDER — WARFARIN SODIUM 6 MG PO TABS: ORAL_TABLET | ORAL | 1 refills | Status: AC

## 2024-01-29 NOTE — Patient Instructions (Addendum)
 Pre visit review using our clinic review tool, if applicable. No additional management support is needed unless otherwise documented below in the visit note.  Increase dose today to take 1 1/2 tablets and then continue 1/2 tablet daily except take 1 tablet on Monday, Wednesday and Friday. Recheck in 2 weeks.

## 2024-01-29 NOTE — Progress Notes (Signed)
 Pt reports neurology changed his medication from Lyrica  to OXcarbazepine  (TRILEPTAL ) . Neither have any interaction with warfarin. Pt reports he has had some epistasis, very small amount, that he reports is pink in color when blowing his nose. This has not worsened over the 2 weeks. Pt denies any today.he reports he had seen ENT in the past and they reported he has some sinus thing. Advised if any frank blood to contact the coumadin  clinic. Pt verbalized understanding.  Increase dose today to take 1 1/2 tablets and then continue 1/2 tablet daily except take 1 tablet on Monday, Wednesday and Friday. Recheck in 2 weeks.  Pt is compliant with warfarin management and PCP apts.  Sent in refill of warfarin to requested pharmacy.

## 2024-02-05 ENCOUNTER — Ambulatory Visit: Payer: MEDICARE

## 2024-02-10 ENCOUNTER — Other Ambulatory Visit (HOSPITAL_BASED_OUTPATIENT_CLINIC_OR_DEPARTMENT_OTHER): Payer: Self-pay | Admitting: Cardiology

## 2024-02-10 DIAGNOSIS — I4821 Permanent atrial fibrillation: Secondary | ICD-10-CM

## 2024-02-12 ENCOUNTER — Ambulatory Visit (INDEPENDENT_AMBULATORY_CARE_PROVIDER_SITE_OTHER): Payer: MEDICARE

## 2024-02-12 DIAGNOSIS — Z7901 Long term (current) use of anticoagulants: Secondary | ICD-10-CM | POA: Diagnosis not present

## 2024-02-12 DIAGNOSIS — I447 Left bundle-branch block, unspecified: Secondary | ICD-10-CM | POA: Diagnosis not present

## 2024-02-12 LAB — CUP PACEART REMOTE DEVICE CHECK
Battery Remaining Longevity: 95 mo
Battery Voltage: 3 V
Brady Statistic RA Percent Paced: 0 %
Brady Statistic RV Percent Paced: 85.17 %
Date Time Interrogation Session: 20250822074943
Implantable Lead Connection Status: 753985
Implantable Lead Connection Status: 753985
Implantable Lead Connection Status: 753985
Implantable Lead Implant Date: 20241121
Implantable Lead Implant Date: 20241121
Implantable Lead Implant Date: 20241121
Implantable Lead Location: 753858
Implantable Lead Location: 753859
Implantable Lead Location: 753860
Implantable Lead Model: 3830
Implantable Lead Model: 4598
Implantable Lead Model: 5076
Implantable Pulse Generator Implant Date: 20241121
Lead Channel Impedance Value: 1007 Ohm
Lead Channel Impedance Value: 342 Ohm
Lead Channel Impedance Value: 399 Ohm
Lead Channel Impedance Value: 418 Ohm
Lead Channel Impedance Value: 437 Ohm
Lead Channel Impedance Value: 437 Ohm
Lead Channel Impedance Value: 456 Ohm
Lead Channel Impedance Value: 513 Ohm
Lead Channel Impedance Value: 532 Ohm
Lead Channel Impedance Value: 665 Ohm
Lead Channel Impedance Value: 722 Ohm
Lead Channel Impedance Value: 760 Ohm
Lead Channel Impedance Value: 950 Ohm
Lead Channel Impedance Value: 969 Ohm
Lead Channel Pacing Threshold Amplitude: 0.75 V
Lead Channel Pacing Threshold Amplitude: 1.5 V
Lead Channel Pacing Threshold Pulse Width: 0.4 ms
Lead Channel Pacing Threshold Pulse Width: 0.6 ms
Lead Channel Sensing Intrinsic Amplitude: 1.25 mV
Lead Channel Sensing Intrinsic Amplitude: 1.25 mV
Lead Channel Sensing Intrinsic Amplitude: 12.5 mV
Lead Channel Sensing Intrinsic Amplitude: 12.5 mV
Lead Channel Setting Pacing Amplitude: 2 V
Lead Channel Setting Pacing Amplitude: 2.5 V
Lead Channel Setting Pacing Amplitude: 3.5 V
Lead Channel Setting Pacing Pulse Width: 0.4 ms
Lead Channel Setting Pacing Pulse Width: 0.6 ms
Lead Channel Setting Sensing Sensitivity: 1.2 mV
Zone Setting Status: 755011
Zone Setting Status: 755011

## 2024-02-12 LAB — POCT INR: INR: 1.7 — AB (ref 2.0–3.0)

## 2024-02-12 NOTE — Patient Instructions (Addendum)
 Pre visit review using our clinic review tool, if applicable. No additional management support is needed unless otherwise documented below in the visit note.  Increase dose today to take 1 1/2 tablets and then continue 1/2 tablet daily except take 1 tablet on Monday, Wednesday and Friday. Recheck in 1 weeks.

## 2024-02-12 NOTE — Progress Notes (Signed)
 Pt missed dose last night. Pt has been stable on this dose in the recent past. Due to the missed dose will not make a change to weekly dosing. Will recheck INR in 1 week.  Increase dose today to take 1 1/2 tablets and then continue 1/2 tablet daily except take 1 tablet on Monday, Wednesday and Friday. Recheck in 1 weeks.

## 2024-02-19 ENCOUNTER — Ambulatory Visit: Payer: MEDICARE

## 2024-02-19 DIAGNOSIS — Z7901 Long term (current) use of anticoagulants: Secondary | ICD-10-CM

## 2024-02-19 LAB — POCT INR: INR: 1.5 — AB (ref 2.0–3.0)

## 2024-02-19 NOTE — Patient Instructions (Addendum)
 Pre visit review using our clinic review tool, if applicable. No additional management support is needed unless otherwise documented below in the visit note.  Increase dose today to take 1 1/2 tablets and then increase dose tomorrow to take 1 tablet and then change weekly dose to take 1 tablet daily except take 1/2 tablet on Monday, Wednesday and Friday. Recheck in 2 weeks.

## 2024-02-19 NOTE — Progress Notes (Signed)
 Increase dose today to take 1 1/2 tablets and then increase dose tomorrow to take 1 tablet and then change weekly dose to take 1 tablet daily except take 1/2 tablet on Monday, Wednesday and Friday. Recheck in 2 weeks.

## 2024-02-21 ENCOUNTER — Encounter: Payer: Self-pay | Admitting: Neurology

## 2024-02-21 ENCOUNTER — Encounter: Payer: Self-pay | Admitting: Internal Medicine

## 2024-02-23 ENCOUNTER — Other Ambulatory Visit: Payer: Self-pay

## 2024-02-23 ENCOUNTER — Telehealth: Payer: Self-pay | Admitting: Neurology

## 2024-02-23 ENCOUNTER — Other Ambulatory Visit: Payer: Self-pay | Admitting: Neurology

## 2024-02-23 DIAGNOSIS — Z79899 Other long term (current) drug therapy: Secondary | ICD-10-CM

## 2024-02-23 MED ORDER — OXCARBAZEPINE 150 MG PO TABS: 450.0000 mg | ORAL_TABLET | Freq: Two times a day (BID) | ORAL | 5 refills | Status: DC

## 2024-02-23 NOTE — Telephone Encounter (Signed)
 Pharmacy wanted to verify directions two sets showing on Script

## 2024-02-23 NOTE — Telephone Encounter (Addendum)
 Name of Medication: Oxycodone  10mg  Name of Pharmacy: [facility] [address] [place] - [address] Last Fill or Written Date and Quantity: 01/11/24 60tab 0refills Last Office Visit and Type: 01/11/24 Degenerative lumbar spinal stenosis   Next Office Visit and Type: 04/12/24 follow up Last Controlled Substance Agreement Date: none Last UDS: none

## 2024-02-24 ENCOUNTER — Other Ambulatory Visit: Payer: MEDICARE

## 2024-02-24 ENCOUNTER — Other Ambulatory Visit: Payer: Self-pay | Admitting: Internal Medicine

## 2024-02-24 DIAGNOSIS — Z79899 Other long term (current) drug therapy: Secondary | ICD-10-CM | POA: Diagnosis not present

## 2024-02-24 LAB — BASIC METABOLIC PANEL WITH GFR
BUN: 14 mg/dL (ref 7–25)
CO2: 32 mmol/L (ref 20–32)
Calcium: 9.1 mg/dL (ref 8.6–10.3)
Chloride: 93 mmol/L — ABNORMAL LOW (ref 98–110)
Creat: 0.87 mg/dL (ref 0.70–1.28)
Glucose, Bld: 140 mg/dL — ABNORMAL HIGH (ref 65–99)
Potassium: 4.9 mmol/L (ref 3.5–5.3)
Sodium: 129 mmol/L — ABNORMAL LOW (ref 135–146)
eGFR: 92 mL/min/1.73m2 (ref >=60)

## 2024-02-24 MED ORDER — OXYCODONE HCL 10 MG PO TABS: 10.0000 mg | ORAL_TABLET | Freq: Four times a day (QID) | ORAL | 0 refills | Status: DC | PRN

## 2024-02-24 NOTE — Telephone Encounter (Signed)
 Left message on voicemail to send what they need for request of whatever medication

## 2024-02-24 NOTE — Telephone Encounter (Signed)
[phone].  

## 2024-02-27 ENCOUNTER — Encounter: Payer: Self-pay | Admitting: Neurology

## 2024-02-27 ENCOUNTER — Ambulatory Visit: Payer: Self-pay | Admitting: Neurology

## 2024-02-28 ENCOUNTER — Ambulatory Visit: Payer: Self-pay | Admitting: Cardiology

## 2024-02-29 MED ORDER — LAMOTRIGINE 25 MG PO TABS: ORAL_TABLET | ORAL | 0 refills | Status: DC

## 2024-02-29 NOTE — Telephone Encounter (Signed)
 Your  sodium level has dropped, He thinks we need to change the oxcarbazepine  to something else. I would like to start LAMOTRIGINE  25MG  TAB: TAKE 1 TABLET AT BEDTIME FOR 2 WEEKS THEN 1 TABLET TWICE DAILY FOR 2 WEEKS THEN 2 TABLETS TWICE DAILY  We will send send in prescription You  should monitor for any new/unusual rash and let us  know   At the same time he will taper off oxcarbazepine  to: 2 tablets twice daily for one week, then 1 tablet twice daily for one week, then STOP

## 2024-03-01 ENCOUNTER — Other Ambulatory Visit: Payer: Self-pay | Admitting: Neurology

## 2024-03-01 MED ORDER — OXCARBAZEPINE 150 MG PO TABS: ORAL_TABLET | ORAL | 0 refills | Status: DC

## 2024-03-04 ENCOUNTER — Ambulatory Visit: Payer: MEDICARE

## 2024-03-04 DIAGNOSIS — Z7901 Long term (current) use of anticoagulants: Secondary | ICD-10-CM

## 2024-03-04 LAB — POCT INR: INR: 1.7 — AB (ref 2.0–3.0)

## 2024-03-04 NOTE — Patient Instructions (Addendum)
 Pre visit review using our clinic review tool, if applicable. No additional management support is needed unless otherwise documented below in the visit note.  Increase dose today to take 1 tablets and then change weekly dose to take 1 tablet daily except take 1/2 tablet on Monday and Friday. Recheck in 1 weeks.

## 2024-03-04 NOTE — Progress Notes (Signed)
 Pt is being tapered of of oxcarbazepine  and starting lamotrigine . No interaction with either medication.  Increase dose today to take 1 tablets and then change weekly dose to take 1 tablet daily except take 1/2 tablet on Monday and Friday. Recheck in 1 weeks.

## 2024-03-11 ENCOUNTER — Ambulatory Visit: Payer: MEDICARE

## 2024-03-11 DIAGNOSIS — Z7901 Long term (current) use of anticoagulants: Secondary | ICD-10-CM | POA: Diagnosis not present

## 2024-03-11 LAB — POCT INR: INR: 2.7 (ref 2.0–3.0)

## 2024-03-11 NOTE — Progress Notes (Signed)
 Continue 1 tablet daily except take 1/2 tablet on Monday and Friday. Recheck in 3 weeks.

## 2024-03-11 NOTE — Patient Instructions (Addendum)
 Pre visit review using our clinic review tool, if applicable. No additional management support is needed unless otherwise documented below in the visit note.  Continue 1 tablet daily except take 1/2 tablet on Monday and Friday. Recheck in 3 weeks.

## 2024-03-15 NOTE — Progress Notes (Signed)
 Remote PPM Transmission

## 2024-03-17 ENCOUNTER — Ambulatory Visit: Payer: MEDICARE | Admitting: Neurology

## 2024-03-19 ENCOUNTER — Encounter: Payer: Self-pay | Admitting: Internal Medicine

## 2024-03-28 ENCOUNTER — Encounter (INDEPENDENT_AMBULATORY_CARE_PROVIDER_SITE_OTHER): Payer: Self-pay

## 2024-04-01 ENCOUNTER — Ambulatory Visit: Payer: MEDICARE

## 2024-04-01 DIAGNOSIS — Z7901 Long term (current) use of anticoagulants: Secondary | ICD-10-CM

## 2024-04-01 LAB — POCT INR: INR: 2.1 (ref 2.0–3.0)

## 2024-04-01 NOTE — Patient Instructions (Addendum)
 Pre visit review using our clinic review tool, if applicable. No additional management support is needed unless otherwise documented below in the visit note.  Continue 1 tablet daily except take 1/2 tablet on Monday and Friday. Recheck in 4 weeks.

## 2024-04-01 NOTE — Progress Notes (Signed)
 Pt in UC a week ago due to cutting finger with electric hedge trimmers. He received a tetanus immunization and cephalexin. No interaction with warfarin.  Continue 1 tablet daily except take 1/2 tablet on Monday and Friday. Recheck in 4 weeks.

## 2024-04-02 DIAGNOSIS — Z7952 Long term (current) use of systemic steroids: Secondary | ICD-10-CM | POA: Diagnosis not present

## 2024-04-02 DIAGNOSIS — Z79899 Other long term (current) drug therapy: Secondary | ICD-10-CM | POA: Diagnosis not present

## 2024-04-02 DIAGNOSIS — L03115 Cellulitis of right lower limb: Secondary | ICD-10-CM | POA: Diagnosis not present

## 2024-04-02 DIAGNOSIS — S81831A Puncture wound without foreign body, right lower leg, initial encounter: Secondary | ICD-10-CM | POA: Diagnosis not present

## 2024-04-02 DIAGNOSIS — M7989 Other specified soft tissue disorders: Secondary | ICD-10-CM | POA: Diagnosis not present

## 2024-04-02 DIAGNOSIS — M1990 Unspecified osteoarthritis, unspecified site: Secondary | ICD-10-CM | POA: Diagnosis not present

## 2024-04-02 DIAGNOSIS — Z86711 Personal history of pulmonary embolism: Secondary | ICD-10-CM | POA: Diagnosis not present

## 2024-04-02 DIAGNOSIS — Z7901 Long term (current) use of anticoagulants: Secondary | ICD-10-CM | POA: Diagnosis not present

## 2024-04-02 DIAGNOSIS — Z86718 Personal history of other venous thrombosis and embolism: Secondary | ICD-10-CM | POA: Diagnosis not present

## 2024-04-02 NOTE — ED Provider Notes (Signed)
 [facility]  ED Provider Note  Robert [name] 73 y.o. male DOB: 12-27-1950 MRN: [id number] History   Chief Complaint  Patient presents with  . Leg Injury    States he got 3 nails stuck on his leg on Thursday. Now having redness, warmth, and swelling to the site. Hx of MRSA. Tetanus UTD.    The patient is a pleasant 73 year old male who presents complaining of an injury to his right lower leg the patient states a board with 3 nails and it struck him in the lower leg 72 hours ago since then he removed the board felt the nails were clean treated the wound with topical antibacterial ointment since then he is noted increasing erythema and swelling to the leg with minimal tenderness over the anterior portion of the leg there is no posterior calf pain there is no thigh pain there is no foot pain the patient otherwise feels well has no fevers chills or sweats no nausea vomiting or diarrhea no shortness of breath cough or other symptoms        Past Medical History:  Diagnosis Date  . Arthritis   . Chronic pain   . DVT (deep venous thrombosis) (*)   . MRSA (methicillin resistant staph aureus) culture positive    2014  . Pulmonary embolism (*)     Past Surgical History:  Procedure Laterality Date  . Appendectomy    . Arm surgery    . Back surgery    . Cholecystectomy    . Knee surgery Bilateral   . Replacement total knee Left     Social History   Substance and Sexual Activity  Alcohol Use Not Currently   Tobacco Use History[1] E-Cigarettes  . Vaping Use Never User   . Start Date    . Cartridges/Day    . Quit Date     Social History   Substance and Sexual Activity  Drug Use Never         Allergies[2]  Discharge Medication List as of 04/03/2024 12:57 AM     CONTINUE these medications which have NOT CHANGED   Details  albuterol  sulfate (PROVENTIL ) 2.5 mg/3 mL nebulizer solution Inhale 2.5 mg into the lungs., Starting Wed  07/22/2017, Historical Med    BL GLUCOSAMINE-CHONDROITIN PO glucosamine ER 500 mg-chondroitin 200 mg tablet,extended release, Historical Med    Cyanocobalamin  (VITAMIN B 12 PO) daily., Historical Med    furosemide  (LASIX ) 20 mg tablet daily., Starting Mon 01/10/2002, Historical Med    levocetirizine (XYZAL ) 5 MG tablet Take 5 mg by mouth., Starting Fri 01/15/2018, Historical Med    nortriptyline  HCl (PAMELOR ) 10 mg capsule nortriptyline  10 mg capsule  TAKE 1 CAPSULE BY MOUTH EVERY EVENING, Historical Med    Omega-3 Fatty Acids  (FISH OIL PO) Take 2 g by mouth., Historical Med    predniSONE  (DELTASONE ) 5 mg tablet prednisone  5 mg tablets in a dose pack  TAKE 1 DOSE PK(S) BY ORAL ROUTE AS DIRECTED FOR 6 DAYS., Historical Med    !! warfarin sodium  (COUMADIN ) 6 mg tablet daily., Starting Mon 01/10/2002, Historical Med    !! warfarin sodium  (COUMADIN ,JANTOVEN ) 3 mg tablet warfarin 3 mg tablet  TAKE AS DIRECTED, Historical Med     !! - Potential duplicate medications found. Please discuss with provider.      Primary Survey   Exposure     No visible trauma noted on back exam.      Review of Systems   Review of  Systems  Constitutional:  Negative for chills and fever.  HENT:  Negative for ear pain and sore throat.   Eyes:  Negative for pain and visual disturbance.  Respiratory:  Negative for cough and shortness of breath.   Cardiovascular:  Negative for chest pain and palpitations.  Gastrointestinal:  Negative for abdominal pain and vomiting.  Genitourinary:  Negative for dysuria and hematuria.  Musculoskeletal:  Negative for arthralgias and back pain.  Skin:  Negative for color change and rash.  Neurological:  Negative for seizures and syncope.  All other systems reviewed and are negative.   Physical Exam   ED Triage Vitals [04/02/24 2333]  BP 140/82  Heart Rate 86  Resp 19  SpO2 99 %  Temp 97.8 F (36.6 C)    Physical Exam  Constitutional: He appears  well-developed and well-nourished. He no respiratory distress.  HENT:  Head: Normocephalic and atraumatic.  Eyes: EOM are intact. Conjunctivae are normal. Pupils are equal, round, and reactive to light.  Neck: Normal range of motion. Neck supple. No muscular tenderness and no spinous process tenderness.  Cardiovascular: Normal rate, regular rhythm, normal heart sounds and intact distal pulses.  No audible murmur. No friction rub and gallop.  Pulmonary/Chest: No respiratory distress. Respiratory effort normal. No chest wall tenderness. No wheezing. No rhonchi. No rales. Abdominal: Soft. There is no abdominal tenderness. There is no guarding and no rebound. Abdomen not distended.  Musculoskeletal: Normal range of motion. No visible trauma noted on back exam.     Cervical back: Normal range of motion and neck supple. No spinous process tenderness or muscular tenderness.     Right lower leg: Swelling and tenderness present. No deformity, lacerations or bony tenderness. 1+ Edema present.     Left lower leg: No swelling, deformity, lacerations or bony tenderness. No edema.     Comments: There is surrounding erythema to the puncture wounds to the anterior right shin   Neurological: He is alert and oriented to person, place, and time. He has normal speech. Cranial nerves intact II through XII. Strength 5/5 bilateral upper and lower extremities.  Skin: Skin is warm.  Psychiatric: He has a normal mood and affect. His behavior is normal. Judgment and thought content normal.     ED Course   Lab results:   CBC AND DIFFERENTIAL - Abnormal      Result Value   WBC 7.6     RBC 4.35 (*)    HGB 14.5     HCT 41.7     MCV 95.9 (*)    MCH 33.3 (*)    MCHC 34.8     Plt Ct 205     RDW SD 46.9 (*)    MPV 9.9     NRBC% 0.0     Absolute NRBC Count 0.00     NEUTROPHIL % 41.8     LYMPHOCYTE % 30.2     MONOCYTE % 11.0     Eosinophil % 15.9     BASOPHIL % 1.1     IG% 0.0     ABSOLUTE NEUTROPHIL COUNT  3.16     ABSOLUTE LYMPHOCYTE COUNT 2.28     Absolute Monocyte Count 0.83 (*)    Absolute Eosinophil Count 1.20 (*)    Absolute Basophil Count 0.08     Absolute Immature Granulocyte Count 0.00    COMPREHENSIVE METABOLIC PANEL - Abnormal   Na 138     Potassium 4.5     Cl 102  CO2 28     AGAP 8     Glucose 105 (*)    BUN 17     Creatinine 0.86     Ca 9.3     ALK PHOS 133     T Bili 0.3     Total Protein 7.3     Alb 3.7     GLOBULIN 3.6     ALBUMIN/GLOBULIN RATIO 1.0 (*)    BUN/CREAT RATIO 19.8     ALT 22     AST 33     eGFR 92     Comment: Normal GFR (glomerular filtration rate) > 60 mL/min/1.73 meters squared, < 60 may include impaired kidney function. Calculation based on the Chronic Kidney Disease Epidemiology Collaboration (CK-EPI)equation refit without adjustment for race.  CULTURE, BLOOD - Normal   Blood Culture No growth at 1 day    CULTURE, BLOOD - Normal   Blood Culture No growth at 1 day      Imaging: No data to display   ECG: ECG Results   None                                                                        Pre-Sedation Procedures  ED Course as of 04/04/24 0452  Elsie ORN Cupo's Documentation  Sun Apr 03, 2024  0042 CBC And Differential(!)  0050 Comprehensive Metabolic Panel(!)   Medical Decision Making Patient has no elevation of his white blood cell count the electrolytes are normal patient be treated with a single dose of intravenous clindamycin  in the emergency department and began an outpatient course of clindamycin  asked to follow-up with the primary caregiver in 3 to 5 days to rest and elevate the leg and return for increasing recurrent symptoms  Amount and/or Complexity of Data Reviewed Labs: ordered. Decision-making details documented in ED Course.  Risk Prescription drug management.          Provider Communication  Discharge Medication List as of 04/03/2024 12:57 AM     START  taking these medications   Details  clindamycin  (CLEOCIN ) 150 mg capsule Take one capsule (150 mg dose) by mouth every 6 (six) hours for 10 days., Starting Sun 04/03/2024, Until Wed 04/13/2024, Normal        Discharge Medication List as of 04/03/2024 12:57 AM      Discharge Medication List as of 04/03/2024 12:57 AM      Clinical Impression Final diagnoses:  Cellulitis of lower extremity, unspecified laterality    ED Disposition     ED Disposition  Discharge   Condition  Stable   Comment  --                 Follow-up Information     Wanda Gull, MD In 3 days.   Specialty: Psychology Contact information: [address] [place] [address] [phone]                  Electronically signed by:       [1] Social History Tobacco Use  Smoking Status Never  Smokeless Tobacco Never  [2] Allergies Allergen Reactions  . Cat Hair Extract Itching  . Diltiazem  Anaphylaxis  . Metoprolol  Tartrate Other    Asthmatic issues  Asthmatic issues    . Penicillins Hives  . Sulfa Antibiotics Hives and Swelling   Elsie LELON Peon, MD 04/04/24 [phone]

## 2024-04-04 ENCOUNTER — Ambulatory Visit: Payer: MEDICARE | Admitting: Internal Medicine

## 2024-04-04 ENCOUNTER — Other Ambulatory Visit: Payer: Self-pay | Admitting: Neurology

## 2024-04-04 ENCOUNTER — Encounter: Payer: Self-pay | Admitting: Internal Medicine

## 2024-04-04 VITALS — BP 104/60 | HR 89 | Temp 98.0°F | Ht 68.0 in | Wt 201.0 lb

## 2024-04-04 DIAGNOSIS — L03116 Cellulitis of left lower limb: Secondary | ICD-10-CM

## 2024-04-04 DIAGNOSIS — G8929 Other chronic pain: Secondary | ICD-10-CM | POA: Diagnosis not present

## 2024-04-04 DIAGNOSIS — M545 Low back pain, unspecified: Secondary | ICD-10-CM

## 2024-04-04 DIAGNOSIS — I10 Essential (primary) hypertension: Secondary | ICD-10-CM | POA: Diagnosis not present

## 2024-04-04 DIAGNOSIS — E538 Deficiency of other specified B group vitamins: Secondary | ICD-10-CM | POA: Diagnosis not present

## 2024-04-04 MED ORDER — OXYCODONE HCL 10 MG PO TABS: 10.0000 mg | ORAL_TABLET | Freq: Four times a day (QID) | ORAL | 0 refills | Status: DC | PRN

## 2024-04-04 NOTE — Assessment & Plan Note (Signed)
 s/p ER visit - an injury to his right lower leg the patient states a board with 3 nails and it struck him in the lower leg 72 hours ago since then he removed the board felt the nails were clean treated the wound with topical antibacterial ointment since then he is noted increasing erythema and swelling to the leg with minimal tenderness over the anterior portion of the leg there is no posterior calf pain there is no thigh pain there is no foot pain the patient otherwise feels well has no fevers chills or sweats no nausea vomiting or diarrhea no shortness of breath cough or other symptoms  On Clinda po

## 2024-04-04 NOTE — Assessment & Plan Note (Signed)
 On Prednisone  low dose - Dr Baird  Potential benefits of a long term steroid  use as well as potential risks  and complications were explained to the patient and were aknowledged. Oxy prn  Potential benefits of a long term opioids use as well as potential risks (i.e. addiction risk, apnea etc) and complications (i.e. Somnolence, constipation and others) were explained to the patient and were aknowledged.

## 2024-04-04 NOTE — Assessment & Plan Note (Signed)
 On B12

## 2024-04-04 NOTE — Assessment & Plan Note (Signed)
 Cont on Losartan

## 2024-04-04 NOTE — Progress Notes (Signed)
 Subjective:  Patient ID: Robert [name], male    DOB: Mar 07, 1951  Age: 73 y.o. MRN: [id number]  CC: Medical Management of Chronic Issues (3 Month follow up)   HPI Robert [name] presents for s/p ER visit (Sunday) - an injury to his right lower leg the patient states a board with 3 nails and it struck him in the lower leg 72 hours ago since then he removed the board felt the nails were clean treated the wound with topical antibacterial ointment since then he is noted increasing erythema and swelling to the leg with minimal tenderness over the anterior portion of the leg there is no posterior calf pain there is no thigh pain there is no foot pain the patient otherwise feels well has no fevers chills or sweats no nausea vomiting or diarrhea no shortness of breath cough or other symptoms  On Clinda po  Outpatient Medications Prior to Visit  Medication Sig Dispense Refill   albuterol  (VENTOLIN  HFA) 108 (90 Base) MCG/ACT inhaler Inhale 2 puffs into the lungs every 6 (six) hours as needed for wheezing or shortness of breath. 6.7 g 0   allopurinol  (ZYLOPRIM ) 100 MG tablet Take 1 tablet (100 mg total) by mouth daily. 90 tablet 1   celecoxib  (CELEBREX ) 200 MG capsule Take 1 capsule (200 mg total) by mouth 2 (two) times daily. 14 capsule 0   cetirizine  (ZYRTEC ) 10 MG tablet Take 1 tablet (10 mg total) by mouth daily. 90 tablet 3   Cholecalciferol (VITAMIN D3) 125 MCG (5000 UT) CAPS Take 1 capsule (5,000 Units total) by mouth daily. 30 capsule 3   colchicine  0.6 MG tablet TAKE 2 TABLETS BY MOUTH AS NEEDED FOR  GOUT  ATTACK  THEN  TAKE  ANOTHER  ONE  TABLET  IN  1  TO  2  HOURS.  DO  NOT  REPEAT  FOR  3  DAYS. 18 tablet 0   digoxin  (LANOXIN ) 0.125 MG tablet Take 1 tablet by mouth once daily 90 tablet 0   doxycycline  (MONODOX ) 100 MG capsule Take 1 capsule (100 mg total) by mouth as directed. 1 hour before any dental work, including cleanings. 6 capsule 6   doxycycline  (VIBRA -TABS) 100 MG tablet Take  1 tablet (100 mg total) by mouth 2 (two) times daily. 20 tablet 0   famotidine  (PEPCID ) 40 MG tablet Take 1 tablet by mouth once daily 30 tablet 5   faricimab -svoa (VABYSMO ) 6 MG/0.05ML SOLN intravitreal injection 6 mg by Intravitreal route once. Injections every 9 weeks     furosemide  (LASIX ) 20 MG tablet Take 1 tablet (20 mg total) by mouth 2 (two) times daily. 180 tablet 1   glucosamine-chondroitin 500-400 MG tablet Take 1 tablet by mouth every morning.     lamoTRIgine  (LAMICTAL ) 25 MG tablet TAKE 1 TABLET AT BEDTIME FOR 2 WEEKS THEN 1 TABLET TWICE DAILY FOR 2 WEEKS THEN 2 TABLETS TWICE DAILY 120 tablet 0   mupirocin ointment (BACTROBAN) 2 % Place 1 Application into the nose daily as needed (irritation).     nortriptyline  (PAMELOR ) 25 MG capsule Take 1 capsule (25 mg total) by mouth at bedtime. 90 capsule 3   predniSONE  (DELTASONE ) 5 MG tablet Take 1 tablet (5 mg total) by mouth daily with breakfast. 90 tablet 3   pregabalin  (LYRICA ) 100 MG capsule Take 1 capsule (100 mg total) by mouth 3 (three) times daily. 90 capsule 5   triamcinolone  cream (KENALOG ) 0.1 % Apply 1 Application topically 3 (three)  times daily. 450 g 1   warfarin (COUMADIN ) 6 MG tablet TAKE ONE-HALF (1/2) TABLET BY MOUTH DAILY EXCEPT TAKE 1 TABLET ON MONDAYS, WEDNESDAYS AND FRIDAYS OR AS DIRECTED BY ANTICOAGULATION CLINIC 95 tablet 1   Oxycodone  HCl 10 MG TABS Take 1 tablet (10 mg total) by mouth 4 (four) times daily as needed. 60 tablet 0   fish oil-omega-3 fatty acids  1000 MG capsule Take 1 g by mouth every morning.     OXcarbazepine  (TRILEPTAL ) 150 MG tablet 2 tablets twice daily for one week, then, 1 tablet twice daily for one week, then STOP 34 tablet 0   No facility-administered medications prior to visit.    ROS: Review of Systems  Constitutional:  Positive for unexpected weight change. Negative for appetite change and fatigue.  HENT:  Negative for congestion, nosebleeds, sneezing, sore throat and trouble swallowing.    Eyes:  Negative for itching and visual disturbance.  Respiratory:  Negative for cough.   Cardiovascular:  Negative for chest pain, palpitations and leg swelling.  Gastrointestinal:  Negative for abdominal distention, blood in stool, diarrhea and nausea.  Genitourinary:  Negative for frequency and hematuria.  Musculoskeletal:  Positive for back pain and gait problem. Negative for joint swelling and neck pain.  Skin:  Positive for color change and wound. Negative for [name].  Neurological:  Negative for dizziness, tremors, speech difficulty and weakness.  Hematological:  Bruises/bleeds easily.  Psychiatric/Behavioral:  Negative for agitation, dysphoric mood and sleep disturbance. The patient is not nervous/anxious.     Objective:  BP 104/60   Pulse 89   Temp 98 F (36.7 C)   Ht 5' 8 (1.727 m)   Wt 201 lb (91.2 kg)   SpO2 97%   BMI 30.56 kg/m   BP Readings from Last 3 Encounters:  04/04/24 104/60  01/27/24 (!) 110/58  01/11/24 116/69    Wt Readings from Last 3 Encounters:  04/04/24 201 lb (91.2 kg)  01/27/24 208 lb (94.3 kg)  01/11/24 208 lb (94.3 kg)    Physical Exam Constitutional:      General: He is not in acute distress.    Appearance: Normal appearance. He is well-developed.     Comments: NAD  Eyes:     Conjunctiva/sclera: Conjunctivae normal.     Pupils: Pupils are equal, round, and reactive to light.  Neck:     Thyroid : No thyromegaly.     Vascular: No JVD.  Cardiovascular:     Rate and Rhythm: Normal rate and regular rhythm.     Heart sounds: Normal heart sounds. No murmur heard.    No friction rub. No gallop.  Pulmonary:     Effort: Pulmonary effort is normal. No respiratory distress.     Breath sounds: Normal breath sounds. No wheezing or rales.  Chest:     Chest wall: No tenderness.  Abdominal:     General: Bowel sounds are normal. There is no distension.     Palpations: Abdomen is soft. There is no mass.     Tenderness: There is no abdominal  tenderness. There is no guarding or rebound.  Musculoskeletal:        General: Tenderness present. Normal range of motion.     Cervical back: Normal range of motion.  Lymphadenopathy:     Cervical: No cervical adenopathy.  Skin:    General: Skin is warm and dry.     Findings: Erythema present. No bruising or [name].  Neurological:     Mental Status: He is  alert and oriented to person, place, and time.     Cranial Nerves: No cranial nerve deficit.     Motor: No abnormal muscle tone.     Coordination: Coordination normal.     Gait: Gait normal.     Deep Tendon Reflexes: Reflexes are normal and symmetric.  Psychiatric:        Behavior: Behavior normal.        Thought Content: Thought content normal.        Judgment: Judgment normal.   Healing wounds on the RLE LS w/pain  Lab Results  Component Value Date   WBC 6.8 10/29/2023   HGB 15.7 10/29/2023   HCT 45.7 10/29/2023   PLT 240 10/29/2023   GLUCOSE 140 (H) 02/24/2024   CHOL 191 03/17/2022   TRIG 168.0 (H) 03/17/2022   HDL 36.20 (L) 03/17/2022   LDLDIRECT 137.0 03/10/2017   LDLCALC 121 (H) 03/17/2022   ALT 20 09/28/2023   AST 26 09/28/2023   NA 129 (L) 02/24/2024   K 4.9 02/24/2024   CL 93 (L) 02/24/2024   CREATININE 0.87 02/24/2024   BUN 14 02/24/2024   CO2 32 02/24/2024   TSH 3.51 09/28/2023   PSA 0.09 (L) 03/30/2019   INR 2.1 04/01/2024   HGBA1C 5.8 11/12/2021    MR FACE/TRIGEMINAL WO/W CM Result Date: 01/18/2024 CLINICAL DATA:  Right trigeminal neuralgia EXAM: MRI FACE TRIGEMINAL WITHOUT AND WITH CONTRAST TECHNIQUE: Multiplanar, multi-echo pulse sequences of the face and surrounding structures, including thin-slice imaging of the trigeminal nerves, were acquired before and after intravenous contrast administration. CONTRAST:  9mL GADAVIST  GADOBUTROL  1 MMOL/ML IV SOLN COMPARISON:  None Available. FINDINGS: MRI brain: The trigeminal nerves are normal and symmetric. There is no vascular structure impinging on the nerve  root entry zone on either side. Meckel's caves are normal and symmetric. No significant signal abnormality in the visualized part of the brain. No abnormal enhancement. The temporomandibular joints appear normal and symmetric. The cavernous sinuses are normal and symmetric. The pituitary gland and suprasellar structures are normal. There is left maxillary sinus disease with mucosal thickening and fluid in the sinus. IMPRESSION: Normal Electronically Signed   By: Nancyann Burns M.D.   On: 01/18/2024 11:16    Assessment & Plan:   Problem List Items Addressed This Visit     B12 deficiency   On B12      Cellulitis, leg - Primary   s/p ER visit - an injury to his right lower leg the patient states a board with 3 nails and it struck him in the lower leg 72 hours ago since then he removed the board felt the nails were clean treated the wound with topical antibacterial ointment since then he is noted increasing erythema and swelling to the leg with minimal tenderness over the anterior portion of the leg there is no posterior calf pain there is no thigh pain there is no foot pain the patient otherwise feels well has no fevers chills or sweats no nausea vomiting or diarrhea no shortness of breath cough or other symptoms  On Clinda po      Essential hypertension   Cont on Losartan       Low back pain   On Prednisone  low dose - Dr Baird  Potential benefits of a long term steroid  use as well as potential risks  and complications were explained to the patient and were aknowledged. Oxy prn  Potential benefits of a long term opioids use as well as potential  risks (i.e. addiction risk, apnea etc) and complications (i.e. Somnolence, constipation and others) were explained to the patient and were aknowledged.       Relevant Medications   Oxycodone  HCl 10 MG TABS      Meds ordered this encounter  Medications   Oxycodone  HCl 10 MG TABS    Sig: Take 1 tablet (10 mg total) by mouth 4 (four) times daily as  needed.    Dispense:  60 tablet    Refill:  0      Follow-up: No follow-ups on file.  Marolyn Noel, MD

## 2024-04-12 ENCOUNTER — Ambulatory Visit: Payer: MEDICARE | Admitting: Internal Medicine

## 2024-04-14 DIAGNOSIS — H34831 Tributary (branch) retinal vein occlusion, right eye, with macular edema: Secondary | ICD-10-CM | POA: Diagnosis not present

## 2024-04-20 DIAGNOSIS — M25551 Pain in right hip: Secondary | ICD-10-CM | POA: Diagnosis not present

## 2024-04-20 DIAGNOSIS — M5416 Radiculopathy, lumbar region: Secondary | ICD-10-CM | POA: Diagnosis not present

## 2024-04-25 ENCOUNTER — Encounter: Payer: Self-pay | Admitting: Radiology

## 2024-04-27 ENCOUNTER — Other Ambulatory Visit (HOSPITAL_BASED_OUTPATIENT_CLINIC_OR_DEPARTMENT_OTHER): Payer: Self-pay | Admitting: Cardiology

## 2024-04-27 DIAGNOSIS — I4821 Permanent atrial fibrillation: Secondary | ICD-10-CM

## 2024-04-29 ENCOUNTER — Ambulatory Visit: Payer: MEDICARE

## 2024-04-29 ENCOUNTER — Other Ambulatory Visit: Payer: MEDICARE

## 2024-04-29 DIAGNOSIS — Z79899 Other long term (current) drug therapy: Secondary | ICD-10-CM | POA: Diagnosis not present

## 2024-04-29 DIAGNOSIS — Z7901 Long term (current) use of anticoagulants: Secondary | ICD-10-CM | POA: Diagnosis not present

## 2024-04-29 LAB — POCT INR: INR: 3.3 — AB (ref 2.0–3.0)

## 2024-04-29 NOTE — Progress Notes (Signed)
 Pt in ER on 10/11 with injury from board with nails in it hitting his leg. Treated in ER for cellulitis with clindamycin . No interaction with warfarin.  Hold warfarin today and then continue 1 tablet daily except take 1/2 tablet on Monday and Friday. Recheck in 2 weeks.

## 2024-04-29 NOTE — Patient Instructions (Addendum)
 Pre visit review using our clinic review tool, if applicable. No additional management support is needed unless otherwise documented below in the visit note.  Hold warfarin today and then continue 1 tablet daily except take 1/2 tablet on Monday and Friday. Recheck in 2 weeks.

## 2024-04-30 LAB — BASIC METABOLIC PANEL WITH GFR
BUN: 17 mg/dL (ref 7–25)
CO2: 35 mmol/L — ABNORMAL HIGH (ref 20–32)
Calcium: 9.3 mg/dL (ref 8.6–10.3)
Chloride: 100 mmol/L (ref 98–110)
Creat: 0.78 mg/dL (ref 0.70–1.28)
Glucose, Bld: 99 mg/dL (ref 65–99)
Potassium: 4.6 mmol/L (ref 3.5–5.3)
Sodium: 140 mmol/L (ref 135–146)
eGFR: 95 mL/min/1.73m2 (ref >=60)

## 2024-05-02 ENCOUNTER — Ambulatory Visit: Payer: Self-pay | Admitting: Neurology

## 2024-05-02 ENCOUNTER — Encounter: Payer: Self-pay | Admitting: Internal Medicine

## 2024-05-02 NOTE — Telephone Encounter (Signed)
 Pt called back.

## 2024-05-02 NOTE — Progress Notes (Signed)
 Patient advised of results.  Per patient sometimes a sharp pain will hit in his jaw. It's not all the time. But he wants to get in control before it is all the time.  Patient wanted to know if he could go up on his lamotrigine . Tight now he is doing 25 mg Take 2 tablets (50 mg total) by mouth 2 (two) times daily.

## 2024-05-02 NOTE — Telephone Encounter (Signed)
 Pt called and lvm regarding lab work.  pH: [phone]

## 2024-05-02 NOTE — Progress Notes (Signed)
Tried calling patient no answer. LMOVM to call the office back.

## 2024-05-03 MED ORDER — OXYCODONE HCL 10 MG PO TABS: 10.0000 mg | ORAL_TABLET | Freq: Four times a day (QID) | ORAL | 0 refills | Status: DC | PRN

## 2024-05-04 ENCOUNTER — Other Ambulatory Visit: Payer: Self-pay | Admitting: Neurology

## 2024-05-04 MED ORDER — LAMOTRIGINE 100 MG PO TABS: 100.0000 mg | ORAL_TABLET | Freq: Two times a day (BID) | ORAL | 5 refills | Status: AC

## 2024-05-05 NOTE — Progress Notes (Signed)
 LMOVM new script sent for increase 100 mg BID

## 2024-05-13 ENCOUNTER — Ambulatory Visit: Payer: MEDICARE

## 2024-05-13 ENCOUNTER — Ambulatory Visit (INDEPENDENT_AMBULATORY_CARE_PROVIDER_SITE_OTHER): Payer: MEDICARE

## 2024-05-13 DIAGNOSIS — Z7901 Long term (current) use of anticoagulants: Secondary | ICD-10-CM | POA: Diagnosis not present

## 2024-05-13 DIAGNOSIS — I447 Left bundle-branch block, unspecified: Secondary | ICD-10-CM | POA: Diagnosis not present

## 2024-05-13 LAB — CUP PACEART REMOTE DEVICE CHECK
Battery Remaining Longevity: 91 mo
Battery Voltage: 2.99 V
Brady Statistic RA Percent Paced: 0 %
Brady Statistic RV Percent Paced: 84.77 %
Date Time Interrogation Session: 20251120225633
Implantable Lead Connection Status: 753985
Implantable Lead Connection Status: 753985
Implantable Lead Connection Status: 753985
Implantable Lead Implant Date: 20241121
Implantable Lead Implant Date: 20241121
Implantable Lead Implant Date: 20241121
Implantable Lead Location: 753858
Implantable Lead Location: 753859
Implantable Lead Location: 753860
Implantable Lead Model: 3830
Implantable Lead Model: 4598
Implantable Lead Model: 5076
Implantable Pulse Generator Implant Date: 20241121
Lead Channel Impedance Value: 323 Ohm
Lead Channel Impedance Value: 399 Ohm
Lead Channel Impedance Value: 399 Ohm
Lead Channel Impedance Value: 418 Ohm
Lead Channel Impedance Value: 437 Ohm
Lead Channel Impedance Value: 437 Ohm
Lead Channel Impedance Value: 532 Ohm
Lead Channel Impedance Value: 551 Ohm
Lead Channel Impedance Value: 551 Ohm
Lead Channel Impedance Value: 703 Ohm
Lead Channel Impedance Value: 722 Ohm
Lead Channel Impedance Value: 817 Ohm
Lead Channel Impedance Value: 836 Ohm
Lead Channel Impedance Value: 893 Ohm
Lead Channel Pacing Threshold Amplitude: 0.75 V
Lead Channel Pacing Threshold Amplitude: 1.5 V
Lead Channel Pacing Threshold Pulse Width: 0.4 ms
Lead Channel Pacing Threshold Pulse Width: 0.6 ms
Lead Channel Sensing Intrinsic Amplitude: 1.25 mV
Lead Channel Sensing Intrinsic Amplitude: 1.25 mV
Lead Channel Sensing Intrinsic Amplitude: 11.875 mV
Lead Channel Sensing Intrinsic Amplitude: 11.875 mV
Lead Channel Setting Pacing Amplitude: 2 V
Lead Channel Setting Pacing Amplitude: 2.5 V
Lead Channel Setting Pacing Amplitude: 3.5 V
Lead Channel Setting Pacing Pulse Width: 0.4 ms
Lead Channel Setting Pacing Pulse Width: 0.6 ms
Lead Channel Setting Sensing Sensitivity: 1.2 mV
Zone Setting Status: 755011
Zone Setting Status: 755011

## 2024-05-13 LAB — POCT INR: INR: 2.2 (ref 2.0–3.0)

## 2024-05-13 NOTE — Patient Instructions (Addendum)
 Pre visit review using our clinic review tool, if applicable. No additional management support is needed unless otherwise documented below in the visit note.  Continue 1 tablet daily except take 1/2 tablet on Monday and Friday. Recheck in 4 weeks.

## 2024-05-13 NOTE — Progress Notes (Signed)
 Indication: Afib, DVT, TAVR Pt missed a dose two days ago. Pt reports lamotrigine  was doubled in dose to 100  mg BID. No interaction with warfarin. Continue 1 tablet daily except take 1/2 tablet on Monday and Friday. Recheck in 4 weeks.

## 2024-05-16 NOTE — Progress Notes (Signed)
 Remote PPM Transmission

## 2024-05-20 ENCOUNTER — Other Ambulatory Visit: Payer: Self-pay | Admitting: Internal Medicine

## 2024-05-22 DIAGNOSIS — S0990XA Unspecified injury of head, initial encounter: Secondary | ICD-10-CM | POA: Diagnosis not present

## 2024-05-22 DIAGNOSIS — S0993XA Unspecified injury of face, initial encounter: Secondary | ICD-10-CM | POA: Diagnosis not present

## 2024-05-22 DIAGNOSIS — S0083XA Contusion of other part of head, initial encounter: Secondary | ICD-10-CM | POA: Diagnosis not present

## 2024-05-23 ENCOUNTER — Encounter: Payer: Self-pay | Admitting: Internal Medicine

## 2024-05-25 ENCOUNTER — Other Ambulatory Visit: Payer: Self-pay | Admitting: Internal Medicine

## 2024-05-25 MED ORDER — OXYCODONE HCL 10 MG PO TABS: 10.0000 mg | ORAL_TABLET | Freq: Four times a day (QID) | ORAL | 0 refills | Status: DC | PRN

## 2024-05-25 NOTE — Progress Notes (Signed)
Prescription

## 2024-06-09 ENCOUNTER — Ambulatory Visit: Payer: Self-pay | Admitting: Cardiology

## 2024-06-10 ENCOUNTER — Ambulatory Visit: Payer: MEDICARE

## 2024-06-10 DIAGNOSIS — Z7901 Long term (current) use of anticoagulants: Secondary | ICD-10-CM | POA: Diagnosis not present

## 2024-06-10 LAB — POCT INR: INR: 2.2 (ref 2.0–3.0)

## 2024-06-10 NOTE — Patient Instructions (Addendum)
 Pre visit review using our clinic review tool, if applicable. No additional management support is needed unless otherwise documented below in the visit note.  Continue 1 tablet daily except take 1/2 tablet on Monday and Friday. Recheck in 4 weeks.

## 2024-06-10 NOTE — Progress Notes (Addendum)
 Indication: Afib, DVT, TAVR In ER on 11/30 for fall. Head CT was negative for any bleeding.  Continue 1 tablet daily except take 1/2 tablet on Monday and Friday. Recheck in 4 weeks.  Medical screening examination/treatment/procedure(s) were performed by non-physician practitioner and as supervising physician I was immediately available for consultation/collaboration.  I agree with above. Karlynn Noel, MD

## 2024-06-20 ENCOUNTER — Other Ambulatory Visit: Payer: Self-pay | Admitting: Neurology

## 2024-06-20 ENCOUNTER — Encounter: Payer: Self-pay | Admitting: Internal Medicine

## 2024-06-24 ENCOUNTER — Other Ambulatory Visit: Payer: Self-pay | Admitting: Internal Medicine

## 2024-06-24 MED ORDER — OXYCODONE HCL 10 MG PO TABS: 10.0000 mg | ORAL_TABLET | Freq: Four times a day (QID) | ORAL | 0 refills | Status: DC | PRN

## 2024-07-05 ENCOUNTER — Ambulatory Visit: Payer: MEDICARE

## 2024-07-05 ENCOUNTER — Ambulatory Visit: Payer: MEDICARE | Admitting: Internal Medicine

## 2024-07-05 ENCOUNTER — Encounter: Payer: Self-pay | Admitting: Internal Medicine

## 2024-07-05 VITALS — BP 124/82 | HR 83 | Ht 68.0 in | Wt 198.2 lb

## 2024-07-05 DIAGNOSIS — G8929 Other chronic pain: Secondary | ICD-10-CM | POA: Diagnosis not present

## 2024-07-05 DIAGNOSIS — Z7901 Long term (current) use of anticoagulants: Secondary | ICD-10-CM | POA: Diagnosis not present

## 2024-07-05 DIAGNOSIS — I471 Supraventricular tachycardia, unspecified: Secondary | ICD-10-CM | POA: Diagnosis not present

## 2024-07-05 DIAGNOSIS — E039 Hypothyroidism, unspecified: Secondary | ICD-10-CM

## 2024-07-05 DIAGNOSIS — I4811 Longstanding persistent atrial fibrillation: Secondary | ICD-10-CM

## 2024-07-05 DIAGNOSIS — M48061 Spinal stenosis, lumbar region without neurogenic claudication: Secondary | ICD-10-CM

## 2024-07-05 DIAGNOSIS — M545 Low back pain, unspecified: Secondary | ICD-10-CM

## 2024-07-05 DIAGNOSIS — E538 Deficiency of other specified B group vitamins: Secondary | ICD-10-CM

## 2024-07-05 LAB — COMPREHENSIVE METABOLIC PANEL WITH GFR
ALT: 18 U/L (ref 3–53)
AST: 24 U/L (ref 5–37)
Albumin: 3.8 g/dL (ref 3.5–5.2)
Alkaline Phosphatase: 74 U/L (ref 39–117)
BUN: 12 mg/dL (ref 6–23)
CO2: 34 meq/L — ABNORMAL HIGH (ref 19–32)
Calcium: 9.4 mg/dL (ref 8.4–10.5)
Chloride: 101 meq/L (ref 96–112)
Creatinine, Ser: 0.86 mg/dL (ref 0.40–1.50)
GFR: 86.34 mL/min
Glucose, Bld: 70 mg/dL (ref 70–99)
Potassium: 4.2 meq/L (ref 3.5–5.1)
Sodium: 139 meq/L (ref 135–145)
Total Bilirubin: 0.8 mg/dL (ref 0.2–1.2)
Total Protein: 7 g/dL (ref 6.0–8.3)

## 2024-07-05 LAB — CBC WITH DIFFERENTIAL/PLATELET
Basophils Absolute: 0.1 K/uL (ref 0.0–0.1)
Basophils Relative: 1 % (ref 0.0–3.0)
Eosinophils Absolute: 0.5 K/uL (ref 0.0–0.7)
Eosinophils Relative: 8.5 % — ABNORMAL HIGH (ref 0.0–5.0)
HCT: 42.5 % (ref 39.0–52.0)
Hemoglobin: 14.6 g/dL (ref 13.0–17.0)
Lymphocytes Relative: 27.2 % (ref 12.0–46.0)
Lymphs Abs: 1.5 K/uL (ref 0.7–4.0)
MCHC: 34.4 g/dL (ref 30.0–36.0)
MCV: 98.7 fl (ref 78.0–100.0)
Monocytes Absolute: 0.6 K/uL (ref 0.1–1.0)
Monocytes Relative: 10.8 % (ref 3.0–12.0)
Neutro Abs: 3 K/uL (ref 1.4–7.7)
Neutrophils Relative %: 52.5 % (ref 43.0–77.0)
Platelets: 208 K/uL (ref 150.0–400.0)
RBC: 4.3 Mil/uL (ref 4.22–5.81)
RDW: 13.1 % (ref 11.5–15.5)
WBC: 5.6 K/uL (ref 4.0–10.5)

## 2024-07-05 LAB — TSH: TSH: 2.53 u[IU]/mL (ref 0.35–5.50)

## 2024-07-05 LAB — POCT INR: INR: 2 (ref 2.0–3.0)

## 2024-07-05 LAB — T4, FREE: Free T4: 0.86 ng/dL (ref 0.60–1.60)

## 2024-07-05 MED ORDER — OXYCODONE HCL 10 MG PO TABS: 10.0000 mg | ORAL_TABLET | Freq: Four times a day (QID) | ORAL | 0 refills | Status: AC | PRN

## 2024-07-05 NOTE — Patient Instructions (Signed)
 Recent large-scale observational studies provide strong evidence that the shingles vaccine is associated with a reduced risk of developing dementia. It may also help slow cognitive decline in individuals who already have dementia.  Key Findings from Recent Research Multiple natural experiment studies, which leveraged unique vaccination policies to minimize bias, have consistently found a protective link:  Reduced Risk: One large study, published in [place], analyzed health records from over 280,000 older adults in [place] and found that those who received the live-attenuated shingles vaccine (Zostavax, now discontinued in the [place] ) were approximately 20% less likely to develop dementia over a seven-year follow-up period. Slower Progression: A follow-up study published in Cell indicated that for those already living with dementia, the vaccine appeared to slow the progression of the disease and reduced dementia-related deaths by nearly half over nine years. Vascular Dementia Protection: Other research presented at [facility] 2025 indicated that the vaccine lowered the risk of vascular dementia by 50%. Newer Vaccine: A separate study published in [place] [facility] suggested that the newer, more effective recombinant vaccine (Shingrix, currently used in the [place] ) also offers significant protection against dementia, with an association of lower risk than the older vaccine type. Stronger Effect in Women: Several studies noted that the protective effect against dementia was more pronounced in women than in men, possibly due to differences in immune responses or dementia pathogenesis.  Why Might the Vaccine Help? Researchers are still working to determine the exact mechanism, but current theories center on the idea that preventing the shingles virus (varicella-zoster) from reactivating helps protect the brain:  Reduced Inflammation: The most likely explanation is that the vaccine prevents shingles infections and subsequent  inflammation in the nervous system, which is a known risk factor for neurodegenerative diseases like dementia. Immune System Modulation: It's also possible that the vaccine boosts the immune system more broadly, helping the body combat other processes related to cognitive decline.  Important Considerations Observational Data: While the evidence is strong, these findings primarily come from observational studies, which show an association, not a definitive cause-and-effect relationship. A large-scale randomized controlled trial is the gold standard for conclusive proof, but such trials are difficult to conduct for dementia due to the time and cost involved. Public Health Recommendation: The [place].S. Centers for Disease Control and Prevention (CDC) already recommends the two-dose Shingrix vaccine for all healthy adults aged 80 and older primarily to prevent shingles and its painful complications. The potential added benefit for dementia prevention provides another compelling reason to get vaccinated.

## 2024-07-05 NOTE — Progress Notes (Signed)
 "  Subjective:  Patient ID: Robert [name], male    DOB: 08-01-1950  Age: 74 y.o. MRN: [id number]  CC: Medical Management of Chronic Issues (3 Month follow up. FYI of recent fall and ED encounter 01/01)   HPI Robert [name] presents for LBP, A fib, SVT Sister just died at 10 w/A fib, CRF  Outpatient Medications Prior to Visit  Medication Sig Dispense Refill   albuterol  (VENTOLIN  HFA) 108 (90 Base) MCG/ACT inhaler Inhale 2 puffs into the lungs every 6 (six) hours as needed for wheezing or shortness of breath. 6.7 g 0   allopurinol  (ZYLOPRIM ) 100 MG tablet Take 1 tablet by mouth once daily 90 tablet 3   celecoxib  (CELEBREX ) 200 MG capsule Take 1 capsule (200 mg total) by mouth 2 (two) times daily. 14 capsule 0   cetirizine  (ZYRTEC ) 10 MG tablet Take 1 tablet (10 mg total) by mouth daily. 90 tablet 3   Cholecalciferol (VITAMIN D3) 125 MCG (5000 UT) CAPS Take 1 capsule (5,000 Units total) by mouth daily. 30 capsule 3   colchicine  0.6 MG tablet TAKE 2 TABLETS BY MOUTH AS NEEDED FOR  GOUT  ATTACK  THEN  TAKE  ANOTHER  ONE  TABLET  IN  1  TO  2  HOURS.  DO  NOT  REPEAT  FOR  3  DAYS. 18 tablet 0   digoxin  (LANOXIN ) 0.125 MG tablet Take 1 tablet by mouth once daily 90 tablet 2   doxycycline  (VIBRA -TABS) 100 MG tablet Take 1 tablet (100 mg total) by mouth 2 (two) times daily. 20 tablet 0   famotidine  (PEPCID ) 40 MG tablet Take 1 tablet by mouth once daily 30 tablet 5   faricimab -svoa (VABYSMO ) 6 MG/0.05ML SOLN intravitreal injection 6 mg by Intravitreal route once. Injections every 9 weeks     furosemide  (LASIX ) 20 MG tablet Take 1 tablet (20 mg total) by mouth 2 (two) times daily. 180 tablet 1   glucosamine-chondroitin 500-400 MG tablet Take 1 tablet by mouth every morning.     lamoTRIgine  (LAMICTAL ) 100 MG tablet Take 1 tablet (100 mg total) by mouth 2 (two) times daily. 60 tablet 5   mupirocin ointment (BACTROBAN) 2 % Place 1 Application into the nose daily as needed (irritation).      nortriptyline  (PAMELOR ) 25 MG capsule Take 1 capsule (25 mg total) by mouth at bedtime. 90 capsule 3   predniSONE  (DELTASONE ) 5 MG tablet Take 1 tablet (5 mg total) by mouth daily with breakfast. 90 tablet 3   pregabalin  (LYRICA ) 100 MG capsule Take 1 capsule (100 mg total) by mouth 3 (three) times daily. 90 capsule 5   triamcinolone  cream (KENALOG ) 0.1 % Apply 1 Application topically 3 (three) times daily. 450 g 1   warfarin (COUMADIN ) 6 MG tablet TAKE ONE-HALF (1/2) TABLET BY MOUTH DAILY EXCEPT TAKE 1 TABLET ON MONDAYS, WEDNESDAYS AND FRIDAYS OR AS DIRECTED BY ANTICOAGULATION CLINIC 95 tablet 1   Oxycodone  HCl 10 MG TABS Take 1 tablet (10 mg total) by mouth 4 (four) times daily as needed. 60 tablet 0   fish oil-omega-3 fatty acids  1000 MG capsule Take 1 g by mouth every morning.     No facility-administered medications prior to visit.    ROS: Review of Systems  Constitutional:  Negative for appetite change, fatigue and unexpected weight change.  HENT:  Negative for congestion, nosebleeds, sneezing, sore throat and trouble swallowing.   Eyes:  Negative for itching and visual disturbance.  Respiratory:  Negative  for cough.   Cardiovascular:  Negative for chest pain, palpitations and leg swelling.  Gastrointestinal:  Negative for abdominal distention, blood in stool, diarrhea and nausea.  Genitourinary:  Negative for frequency and hematuria.  Musculoskeletal:  Positive for back pain and gait problem. Negative for joint swelling and neck pain.  Skin:  Negative for rash.  Neurological:  Negative for dizziness, tremors, speech difficulty and weakness.  Psychiatric/Behavioral:  Negative for agitation, dysphoric mood and sleep disturbance. The patient is not nervous/anxious.     Objective:  BP 124/82   Pulse 83   Ht 5' 8 (1.727 m)   Wt 198 lb 3.2 oz (89.9 kg)   SpO2 98%   BMI 30.14 kg/m   BP Readings from Last 3 Encounters:  07/05/24 124/82  04/04/24 104/60  01/27/24 (!) 110/58     Wt Readings from Last 3 Encounters:  07/05/24 198 lb 3.2 oz (89.9 kg)  04/04/24 201 lb (91.2 kg)  01/27/24 208 lb (94.3 kg)    Physical Exam Constitutional:      General: He is not in acute distress.    Appearance: Normal appearance. He is well-developed.     Comments: NAD  Eyes:     Conjunctiva/sclera: Conjunctivae normal.     Pupils: Pupils are equal, round, and reactive to light.  Neck:     Thyroid : No thyromegaly.     Vascular: No JVD.  Cardiovascular:     Rate and Rhythm: Normal rate and regular rhythm.     Heart sounds: Normal heart sounds. No murmur heard.    No friction rub. No gallop.  Pulmonary:     Effort: Pulmonary effort is normal. No respiratory distress.     Breath sounds: Normal breath sounds. No wheezing or rales.  Chest:     Chest wall: No tenderness.  Abdominal:     General: Bowel sounds are normal. There is no distension.     Palpations: Abdomen is soft. There is no mass.     Tenderness: There is no abdominal tenderness. There is no guarding or rebound.  Musculoskeletal:        General: No tenderness. Normal range of motion.     Cervical back: Normal range of motion.  Lymphadenopathy:     Cervical: No cervical adenopathy.  Skin:    General: Skin is warm and dry.     Findings: No rash.  Neurological:     Mental Status: He is alert and oriented to person, place, and time.     Cranial Nerves: No cranial nerve deficit.     Motor: No abnormal muscle tone.     Coordination: Coordination normal.     Gait: Gait normal.     Deep Tendon Reflexes: Reflexes are normal and symmetric.  Psychiatric:        Behavior: Behavior normal.        Thought Content: Thought content normal.        Judgment: Judgment normal.   Assymetric shoulders, arthritic gait  Lab Results  Component Value Date   WBC 6.8 10/29/2023   HGB 15.7 10/29/2023   HCT 45.7 10/29/2023   PLT 240 10/29/2023   GLUCOSE 99 04/29/2024   CHOL 191 03/17/2022   TRIG 168.0 (H) 03/17/2022    HDL 36.20 (L) 03/17/2022   LDLDIRECT 137.0 03/10/2017   LDLCALC 121 (H) 03/17/2022   ALT 20 09/28/2023   AST 26 09/28/2023   NA 140 04/29/2024   K 4.6 04/29/2024   CL 100 04/29/2024  CREATININE 0.78 04/29/2024   BUN 17 04/29/2024   CO2 35 (H) 04/29/2024   TSH 3.51 09/28/2023   PSA 0.09 (L) 03/30/2019   INR 2.0 07/05/2024   HGBA1C 5.8 11/12/2021    MR FACE/TRIGEMINAL WO/W CM Result Date: 01/18/2024 CLINICAL DATA:  Right trigeminal neuralgia EXAM: MRI FACE TRIGEMINAL WITHOUT AND WITH CONTRAST TECHNIQUE: Multiplanar, multi-echo pulse sequences of the face and surrounding structures, including thin-slice imaging of the trigeminal nerves, were acquired before and after intravenous contrast administration. CONTRAST:  9mL GADAVIST  GADOBUTROL  1 MMOL/ML IV SOLN COMPARISON:  None Available. FINDINGS: MRI brain: The trigeminal nerves are normal and symmetric. There is no vascular structure impinging on the nerve root entry zone on either side. Meckel's caves are normal and symmetric. No significant signal abnormality in the visualized part of the brain. No abnormal enhancement. The temporomandibular joints appear normal and symmetric. The cavernous sinuses are normal and symmetric. The pituitary gland and suprasellar structures are normal. There is left maxillary sinus disease with mucosal thickening and fluid in the sinus. IMPRESSION: Normal Electronically Signed   By: Nancyann Burns M.D.   On: 01/18/2024 11:16    Assessment & Plan:   Problem List Items Addressed This Visit     B12 deficiency   On B12      Low back pain - Primary   On Prednisone  low dose - Dr Baird  Potential benefits of a long term steroid  use as well as potential risks  and complications were explained to the patient and were aknowledged. Oxy prn  Potential benefits of a long term opioids use as well as potential risks (i.e. addiction risk, apnea etc) and complications (i.e. Somnolence, constipation and others) were explained  to the patient and were aknowledged.       Relevant Medications   Oxycodone  HCl 10 MG TABS   Paroxysmal supraventricular tachycardia (HCC)   Off Amiodarone  due to side effects F/u w/Dr Lonni      Relevant Orders   Comprehensive metabolic panel with GFR   CBC with Differential/Platelet   T4, free   TSH   Acquired hypothyroidism   Not taking levothyroxine        Relevant Orders   T4, free   TSH   Degenerative lumbar spinal stenosis   Oxycodone  prn  Potential benefits of a long term opioids use as well as potential risks (i.e. addiction risk, apnea etc) and complications (i.e. Somnolence, constipation and others) were explained to the patient and were aknowledged.       Relevant Orders   Comprehensive metabolic panel with GFR   CBC with Differential/Platelet   T4, free   TSH   Longstanding persistent atrial fibrillation (HCC)   Anticoagulated on Coumadin          Meds ordered this encounter  Medications   Oxycodone  HCl 10 MG TABS    Sig: Take 1 tablet (10 mg total) by mouth 4 (four) times daily as needed.    Dispense:  60 tablet    Refill:  0    Code: M54.50      Follow-up: Return in about 3 months (around 10/03/2024) for a follow-up visit.  Marolyn Noel, MD "

## 2024-07-05 NOTE — Assessment & Plan Note (Signed)
Off Amiodarone due to side effects F/u w/Dr Cristal Deer

## 2024-07-05 NOTE — Assessment & Plan Note (Signed)
 On Prednisone  low dose - Dr Baird  Potential benefits of a long term steroid  use as well as potential risks  and complications were explained to the patient and were aknowledged. Oxy prn  Potential benefits of a long term opioids use as well as potential risks (i.e. addiction risk, apnea etc) and complications (i.e. Somnolence, constipation and others) were explained to the patient and were aknowledged.
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
## 2024-07-05 NOTE — Assessment & Plan Note (Signed)
Oxycodone prn  Potential benefits of a long term opioids use as well as potential risks (i.e. addiction risk, apnea etc) and complications (i.e. Somnolence, constipation and others) were explained to the patient and were aknowledged. 

## 2024-07-05 NOTE — Assessment & Plan Note (Signed)
 On B12

## 2024-07-05 NOTE — Patient Instructions (Addendum)
 Pre visit review using our clinic review tool, if applicable. No additional management support is needed unless otherwise documented below in the visit note.  Continue 1 tablet daily except take 1/2 tablet on Monday and Friday. Recheck in 4 weeks.

## 2024-07-05 NOTE — Assessment & Plan Note (Signed)
 Not taking levothyroxine 

## 2024-07-05 NOTE — Assessment & Plan Note (Signed)
 Anticoagulated on Coumadin 

## 2024-07-07 ENCOUNTER — Other Ambulatory Visit: Payer: Self-pay | Admitting: Internal Medicine

## 2024-07-10 ENCOUNTER — Ambulatory Visit: Payer: Self-pay | Admitting: Internal Medicine

## 2024-07-11 NOTE — Progress Notes (Unsigned)
 "  Virtual Visit via Video Note:   Consent was obtained for video visit:  Yes.   Answered questions that patient had about telehealth interaction:  Yes.   I discussed the limitations, risks, security and privacy concerns of performing an evaluation and management service by telemedicine. I also discussed with the patient that there may be a patient responsible charge related to this service. The patient expressed understanding and agreed to proceed.  Pt location: Home Physician Location: office Name of referring provider:  [name], Karlynn GAILS, MD I connected with Debby BRAVO Cuccia at patients initiation/request on 07/12/2024 at  8:50 AM EST by video enabled telemedicine application and verified that I am speaking with the correct person using two identifiers. Pt MRN:  [id number] Pt DOB:  02/19/1951 Video Participants:  Debby BRAVO Briley  Assessment/Plan:    Right sided trigeminal neuralgia  Lamotrigine  100mg  twice daily. Take baclofen  10mg  one hour before next dose of lamotrigine  to try and cover treating wearing off pain. Follow up 6 months.   Subjective:  Siler Phariss is a 74 year old male with COPD, CHF, a fib, s/p TAVR, history of DVT who follows up for trigeminal neuralgia.  History supplemented by his accompanying wife.  MRI personally reviewed.  UPDATE: Lamotrigine  100mg  twice daily, nortriptyline  25mg  at bedtime (for general neuropathy)  MRI of face/trigeminal nerve with and without contrast on 01/12/2024 was normal.    We had initially increased pregablin up to 100mg  three times daily, which was ineffective.  Plan was to change to carbamazepine but due to red dye allergy, he was started on oxcarbazepine  instead, which was effective but it caused him to feel dizzy, have blurred vision and feel unsteady on his feet.  He was then changed to lamotrigine .  He is currently taking 100mg  twice daily.  He starts noticing the pain about 30 minutes to one hour prior to next dose and  once he takes the next dose, it calms down in 30 minutes.  He still gets dizziness with the lamotrigine  but more tolerable compared to prior medications tried.     07/05/2024 LABS:  CMP with Na 139, K 4.2, Cl 101, CO2 34, glucose 70, BUN 12, Cr 0.86, t bili 0.8, ALP 74, AST 24, ALT 18, Ca 9.4; CBC with WBC 5.6, HGB 14.6, HCT 42.5, PLT 208.  HISTORY: Since 2014, he has had persistent dull non-throbbing headache across his forehead and aching right retro-orbital pain.  Note that heHe also reports a pressure in his right ear as well.  He also reports a sharp and burning pain from his jaw anterior to his right ear that radiates to the right cheek and down the right side of his jaw.  Cold wind aggravates it.  Pressing behind his right ear helps relieve the pain.  It is fairly persistent, occurring off and on but worse over the past few months.  Since 2014, he also reports right perioral numbness.  He was treated with prednisone  and Valtrex  late last year without improvement.  CT head without contrast from 03/30/2019 was personally reviewed and was unremarkable.  He went to the dentist where full upper and lower X-rays and exam were unremarkable.    He will have flare ups off and on.  He started having worsening episodes several months ago.  He now notes associated pain in the right side of this throat so severe that he sometimes cannot swallow and struggles to eat.  It occurs off and on everyday.  He went  to the ED on 10/29/2023 where he was treated with cocktail of IV fosphenytoin , morphine  and ondansetron  which helped symptoms for a day or two.  He was started on pregablin 75mg  three times daily and prescribed oxycodone  as needed.  He has a nephew with trigeminal neuralgia.  He also has four sisters with history of Bell's palsy.   Current analgesics:  oxycodone  Current antiepileptic:  lamotrigine  100mg  twice daily Other medication:  Warfarin   Past antiepileptic:  oxcarbazepine  (dizziness, blurred vision,  gait instability), pregablin (ineffective), gabapentin (makes him confused).  Carbamazepine not an option due to red dye allergy.    Past Medical History: Past Medical History:  Diagnosis Date   Allergy 09/20/1961   Treatment Dr. Wells/Dr. Gregorio   Anxiety    Arrhythmia 03/01/2013   Asthma    no attack since acupuncture in 1990's   Atrial fibrillation [facility])    Cataract 09/2022   Dr. Delmonte/Dr. Estevan   CHF (congestive heart failure) (HCC)    Complication of anesthesia    SLOW TO WAKE UP / DIFFICULTY RESPONDING 2003, 3 days before could use left arm, wild/crazy sometimes   COPD (chronic obstructive pulmonary disease) (HCC)    Cyst of left kidney    benign   DVT (deep venous thrombosis) (HCC) 06/23/2001   right femoral artery from groin to knee   Edema    Right Leg w/ h/o DVT postphlebitic   H/O blood transfusion reaction 06/23/2002   FFP, extreme swelling, could not breath   Heart murmur 2019   Dr. 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 Wendel and Dr. Maryjane   Severe aortic stenosis    Sleep apnea 1998   Dr. Deane; Dr. Lonni   Spondylolisthesis    Syncope and collapse 2024   Warfarin anticoagulation    Wheezing    when laying on left side    Medications: Outpatient Encounter Medications as of 07/12/2024  Medication Sig   albuterol  (VENTOLIN  HFA) 108 (90 Base) MCG/ACT inhaler Inhale 2 puffs into the lungs every 6 (six) hours as needed for wheezing or shortness of breath.   allopurinol  (ZYLOPRIM ) 100 MG tablet Take 1 tablet by mouth once daily   celecoxib  (CELEBREX ) 200 MG capsule Take 1  capsule (200 mg total) by mouth 2 (two) times daily.   cetirizine  (ZYRTEC ) 10 MG tablet Take 1 tablet (10 mg total) by mouth daily.   Cholecalciferol (VITAMIN D3) 125 MCG (5000 UT) CAPS Take 1 capsule (5,000 Units total) by mouth daily.   colchicine  0.6 MG tablet TAKE 2 TABLETS BY MOUTH AS NEEDED FOR  GOUT  ATTACK  THEN  TAKE  ANOTHER  ONE  TABLET  IN  1  TO  2  HOURS.  DO  NOT  REPEAT  FOR  3  DAYS.   digoxin  (LANOXIN ) 0.125 MG tablet Take 1 tablet by mouth once daily   doxycycline  (VIBRA -TABS) 100 MG tablet Take 1 tablet (100 mg total) by mouth 2 (two) times daily.   famotidine  (PEPCID )  40 MG tablet Take 1 tablet by mouth once daily   faricimab -svoa (VABYSMO ) 6 MG/0.05ML SOLN intravitreal injection 6 mg by Intravitreal route once. Injections every 9 weeks   furosemide  (LASIX ) 20 MG tablet Take 1 tablet (20 mg total) by mouth 2 (two) times daily.   glucosamine-chondroitin 500-400 MG tablet Take 1 tablet by mouth every morning.   lamoTRIgine  (LAMICTAL ) 100 MG tablet Take 1 tablet (100 mg total) by mouth 2 (two) times daily.   mupirocin ointment (BACTROBAN) 2 % Place 1 Application into the nose daily as needed (irritation).   nortriptyline  (PAMELOR ) 25 MG capsule Take 1 capsule by mouth at bedtime   Oxycodone  HCl 10 MG TABS Take 1 tablet (10 mg total) by mouth 4 (four) times daily as needed.   predniSONE  (DELTASONE ) 5 MG tablet Take 1 tablet (5 mg total) by mouth daily with breakfast.   pregabalin  (LYRICA ) 100 MG capsule Take 1 capsule (100 mg total) by mouth 3 (three) times daily.   triamcinolone  cream (KENALOG ) 0.1 % Apply 1 Application topically 3 (three) times daily.   warfarin (COUMADIN ) 6 MG tablet TAKE ONE-HALF (1/2) TABLET BY MOUTH DAILY EXCEPT TAKE 1 TABLET ON MONDAYS, WEDNESDAYS AND FRIDAYS OR AS DIRECTED BY ANTICOAGULATION CLINIC   No facility-administered encounter medications on file as of 07/12/2024.    Allergies: Allergies[1]  Family History: Family History  Problem Relation  Age of Onset   Heart disease Mother        CABG at age 45   Arthritis Father    Hyperlipidemia Father    Hypertension Father    Obesity Father    Heart disease Sister    Migraines Sister    Migraines Sister    Anxiety disorder Sister    Obesity Sister    Parkinsonism Paternal Grandfather    Hyperlipidemia Other    Hypertension Other    Parkinsonism Other    Arrhythmia Sister    Diabetes Sister    Cancer Sister     Observations/Objective:   No acute distress.  Alert and oriented.  Speech fluent and not dysarthric.  Language intact.  Eyes orthophoric on primary gaze.  Face symmetric.   Follow up Instructions:      -I discussed the assessment and treatment plan with the patient. The patient was provided an opportunity to ask questions and all were answered. The patient agreed with the plan and demonstrated an understanding of the instructions.   The patient was advised to call back or seek an in-person evaluation if the symptoms worsen or if the condition fails to improve as anticipated.   Juliene Lamar Dunnings, DO   CC: Karlynn Noel, MD          [1]  Allergies Allergen Reactions   Cardizem  [Diltiazem ] Anaphylaxis   Cat Hair Extract Itching   Lopressor  [Metoprolol  Tartrate] Anaphylaxis    Asthmatic issues    Sulfa Antibiotics Hives and Swelling   Amiodarone  Swelling    Hand swelling   Anoro Ellipta  [Umeclidinium-Vilanterol]     Too much mucus   Breo Ellipta [Fluticasone  Furoate-Vilanterol]     Too much mucus   Buspar  [Buspirone ]     Red rash   Entresto  [Sacubitril-Valsartan] Swelling   Exalgo  [Hydromorphone  Hcl] Nausea Only    Sick and nauseated   Gabapentin     Made pt feel out of it    Hydrocodone-Acetaminophen  Nausea Only   Penicillins Hives    Tolerated Cefazolin  04/07/13, S.Tanda, PharmD   Red Dye  Welps and rashes   Sulfonamide Derivatives Hives and Swelling   Tylenol  [Acetaminophen ]     Liver issues   Cat Dander Itching   "

## 2024-07-12 ENCOUNTER — Encounter: Payer: Self-pay | Admitting: Neurology

## 2024-07-12 ENCOUNTER — Telehealth (INDEPENDENT_AMBULATORY_CARE_PROVIDER_SITE_OTHER): Payer: MEDICARE | Admitting: Neurology

## 2024-07-12 VITALS — Wt 195.0 lb

## 2024-07-12 DIAGNOSIS — G500 Trigeminal neuralgia: Secondary | ICD-10-CM | POA: Diagnosis not present

## 2024-07-12 MED ORDER — BACLOFEN 10 MG PO TABS: 10.0000 mg | ORAL_TABLET | Freq: Two times a day (BID) | ORAL | 5 refills | Status: AC | PRN

## 2024-07-20 ENCOUNTER — Telehealth: Payer: Self-pay
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
## 2024-07-22 ENCOUNTER — Other Ambulatory Visit: Payer: Self-pay | Admitting: Internal Medicine

## 2024-07-29 ENCOUNTER — Other Ambulatory Visit: Payer: Self-pay | Admitting: Internal Medicine

## 2024-07-29 ENCOUNTER — Telehealth: Payer: Self-pay

## 2024-07-29 NOTE — Telephone Encounter (Signed)
 Copied from CRM [id number]. Topic: Clinical - Prescription Issue >> Jul 29, 2024 10:25 AM Robert [name] wrote: Reason for CRM: Patient will be out of predniSONE  (DELTASONE ) 5 MG tablet by the weekend. He would like to put a rush on this, if possible. Prescription was suppose to be called over a week ago by the pharmacy.  Callback #: [id number]  Pharmacy: [facility] [address], [place] - [address] [facility] [place] [address] Phone: [phone] Fax: [phone] Hours: Not open 24 hours

## 2024-07-29 NOTE — Telephone Encounter (Signed)
 Medication has since been sent back in

## 2024-08-02 ENCOUNTER — Ambulatory Visit: Payer: MEDICARE

## 2024-08-22 ENCOUNTER — Ambulatory Visit (HOSPITAL_BASED_OUTPATIENT_CLINIC_OR_DEPARTMENT_OTHER): Payer: MEDICARE | Admitting: Cardiology

## 2024-10-03 ENCOUNTER — Ambulatory Visit: Payer: MEDICARE | Admitting: Internal Medicine

## 2025-01-05 ENCOUNTER — Ambulatory Visit: Payer: MEDICARE

## 2025-01-20 ENCOUNTER — Ambulatory Visit: Payer: Self-pay | Admitting: Neurology
